# Patient Record
Sex: Male | Born: 1955 | Race: White | Hispanic: No | Smoking: Current every day smoker
Health system: Southern US, Community
[De-identification: ages and names within clinical notes are randomized; demographics above are authoritative.]

## PROBLEM LIST (undated history)

## (undated) DIAGNOSIS — J189 Pneumonia, unspecified organism: Secondary | ICD-10-CM

## (undated) DIAGNOSIS — I272 Pulmonary hypertension, unspecified: Secondary | ICD-10-CM

## (undated) DIAGNOSIS — R51 Headache: Secondary | ICD-10-CM

## (undated) DIAGNOSIS — R519 Headache, unspecified: Secondary | ICD-10-CM

## (undated) DIAGNOSIS — F32A Depression, unspecified: Secondary | ICD-10-CM

## (undated) DIAGNOSIS — F329 Major depressive disorder, single episode, unspecified: Secondary | ICD-10-CM

## (undated) DIAGNOSIS — T7840XA Allergy, unspecified, initial encounter: Secondary | ICD-10-CM

## (undated) DIAGNOSIS — J9610 Chronic respiratory failure, unspecified whether with hypoxia or hypercapnia: Secondary | ICD-10-CM

## (undated) DIAGNOSIS — I5032 Chronic diastolic (congestive) heart failure: Secondary | ICD-10-CM

## (undated) DIAGNOSIS — K219 Gastro-esophageal reflux disease without esophagitis: Secondary | ICD-10-CM

## (undated) DIAGNOSIS — I2699 Other pulmonary embolism without acute cor pulmonale: Secondary | ICD-10-CM

## (undated) DIAGNOSIS — F419 Anxiety disorder, unspecified: Secondary | ICD-10-CM

## (undated) DIAGNOSIS — I82403 Acute embolism and thrombosis of unspecified deep veins of lower extremity, bilateral: Secondary | ICD-10-CM

## (undated) DIAGNOSIS — Z720 Tobacco use: Secondary | ICD-10-CM

## (undated) DIAGNOSIS — J449 Chronic obstructive pulmonary disease, unspecified: Secondary | ICD-10-CM

## (undated) DIAGNOSIS — J439 Emphysema, unspecified: Secondary | ICD-10-CM

## (undated) DIAGNOSIS — Z87891 Personal history of nicotine dependence: Secondary | ICD-10-CM

## (undated) DIAGNOSIS — I10 Essential (primary) hypertension: Secondary | ICD-10-CM

## (undated) DIAGNOSIS — J45909 Unspecified asthma, uncomplicated: Secondary | ICD-10-CM

## (undated) HISTORY — PX: ROTATOR CUFF REPAIR: SHX139

## (undated) HISTORY — DX: Pulmonary hypertension, unspecified: I27.20

## (undated) HISTORY — DX: Acute embolism and thrombosis of unspecified deep veins of lower extremity, bilateral: I82.403

## (undated) HISTORY — PX: ADENOIDECTOMY: SUR15

## (undated) HISTORY — PX: HEMORRHOID SURGERY: SHX153

## (undated) HISTORY — PX: NASAL SINUS SURGERY: SHX719

## (undated) HISTORY — DX: Pneumonia, unspecified organism: J18.9

## (undated) HISTORY — PX: CARPAL TUNNEL RELEASE: SHX101

## (undated) HISTORY — PX: NOSE SURGERY: SHX723

## (undated) HISTORY — DX: Personal history of nicotine dependence: Z87.891

## (undated) HISTORY — PX: OTHER SURGICAL HISTORY: SHX169

## (undated) HISTORY — PX: TONSILLECTOMY: SUR1361

## (undated) HISTORY — PX: ABDOMINAL SURGERY: SHX537

## (undated) HISTORY — DX: Other pulmonary embolism without acute cor pulmonale: I26.99

## (undated) HISTORY — PX: URETHRA SURGERY: SHX824

## (undated) HISTORY — PX: SHOULDER SURGERY: SHX246

## (undated) HISTORY — DX: Headache: R51

## (undated) HISTORY — DX: Headache, unspecified: R51.9

## (undated) HISTORY — PX: ELBOW SURGERY: SHX618

## (undated) HISTORY — PX: TENNIS ELBOW RELEASE/NIRSCHEL PROCEDURE: SHX6651

---

## 2005-04-18 ENCOUNTER — Ambulatory Visit: Payer: Self-pay

## 2005-12-04 ENCOUNTER — Other Ambulatory Visit: Payer: Self-pay

## 2005-12-04 ENCOUNTER — Emergency Department: Payer: Self-pay | Admitting: Internal Medicine

## 2007-01-24 ENCOUNTER — Ambulatory Visit: Payer: Self-pay | Admitting: Specialist

## 2008-07-11 ENCOUNTER — Inpatient Hospital Stay: Payer: Self-pay | Admitting: Internal Medicine

## 2008-08-05 ENCOUNTER — Ambulatory Visit: Payer: Self-pay | Admitting: Specialist

## 2008-08-14 ENCOUNTER — Inpatient Hospital Stay: Payer: Self-pay | Admitting: Internal Medicine

## 2008-11-19 ENCOUNTER — Ambulatory Visit: Payer: Self-pay | Admitting: Specialist

## 2008-12-21 ENCOUNTER — Inpatient Hospital Stay: Payer: Self-pay | Admitting: Specialist

## 2009-03-23 ENCOUNTER — Ambulatory Visit: Payer: Self-pay | Admitting: Specialist

## 2009-11-17 ENCOUNTER — Ambulatory Visit: Payer: Self-pay | Admitting: Specialist

## 2010-11-17 ENCOUNTER — Inpatient Hospital Stay: Payer: Self-pay | Admitting: Internal Medicine

## 2011-08-15 ENCOUNTER — Inpatient Hospital Stay: Payer: Self-pay | Admitting: Internal Medicine

## 2011-12-31 ENCOUNTER — Emergency Department: Payer: Self-pay | Admitting: *Deleted

## 2012-01-01 LAB — CBC
MCH: 31.7 pg (ref 26.0–34.0)
MCHC: 32.4 g/dL (ref 32.0–36.0)
MCV: 98 fL (ref 80–100)
RBC: 4.59 10*6/uL (ref 4.40–5.90)
RDW: 13.4 % (ref 11.5–14.5)

## 2012-01-01 LAB — COMPREHENSIVE METABOLIC PANEL
Alkaline Phosphatase: 70 U/L (ref 50–136)
Anion Gap: 6 — ABNORMAL LOW (ref 7–16)
BUN: 10 mg/dL (ref 7–18)
Bilirubin,Total: 0.4 mg/dL (ref 0.2–1.0)
Chloride: 103 mmol/L (ref 98–107)
Creatinine: 1.01 mg/dL (ref 0.60–1.30)
EGFR (African American): >60
EGFR (Non-African Amer.): >60
Glucose: 95 mg/dL (ref 65–99)
Osmolality: 284 (ref 275–301)
SGOT(AST): 22 U/L (ref 15–37)
Sodium: 143 mmol/L (ref 136–145)

## 2012-01-01 LAB — CK TOTAL AND CKMB (NOT AT ARMC)
CK, Total: 131 U/L (ref 35–232)
CK-MB: 3.1 ng/mL (ref 0.5–3.6)

## 2012-02-24 LAB — CK TOTAL AND CKMB (NOT AT ARMC): CK, Total: 102 U/L (ref 35–232)

## 2012-02-24 LAB — BASIC METABOLIC PANEL
Anion Gap: 10 (ref 7–16)
BUN: 8 mg/dL (ref 7–18)
Calcium, Total: 8.8 mg/dL (ref 8.5–10.1)
Creatinine: 0.95 mg/dL (ref 0.60–1.30)
EGFR (African American): >60
EGFR (Non-African Amer.): >60
Osmolality: 280 (ref 275–301)
Potassium: 3.5 mmol/L (ref 3.5–5.1)

## 2012-02-24 LAB — TROPONIN I: Troponin-I: <0.02 ng/mL

## 2012-02-24 LAB — CBC
HCT: 43.7 % (ref 40.0–52.0)
HGB: 14.5 g/dL (ref 13.0–18.0)
MCH: 32.3 pg (ref 26.0–34.0)
MCHC: 33.2 g/dL (ref 32.0–36.0)
MCV: 97 fL (ref 80–100)

## 2012-02-24 LAB — PRO B NATRIURETIC PEPTIDE: B-Type Natriuretic Peptide: 184 pg/mL — ABNORMAL HIGH (ref 0–125)

## 2012-02-26 ENCOUNTER — Inpatient Hospital Stay: Payer: Self-pay | Admitting: Student

## 2012-02-27 LAB — EXPECTORATED SPUTUM ASSESSMENT W GRAM STAIN, RFLX TO RESP C

## 2012-02-28 LAB — CREATININE, SERUM
Creatinine: 1.09 mg/dL (ref 0.60–1.30)
EGFR (African American): >60
EGFR (Non-African Amer.): >60

## 2012-02-28 LAB — PLATELET COUNT: Platelet: 291 10*3/uL (ref 150–440)

## 2012-02-29 DIAGNOSIS — I517 Cardiomegaly: Secondary | ICD-10-CM

## 2012-03-01 LAB — CULTURE, BLOOD (SINGLE)

## 2012-03-02 LAB — BASIC METABOLIC PANEL
Calcium, Total: 8.4 mg/dL — ABNORMAL LOW (ref 8.5–10.1)
Chloride: 100 mmol/L (ref 98–107)
Co2: 32 mmol/L (ref 21–32)
Creatinine: 1.18 mg/dL (ref 0.60–1.30)
EGFR (Non-African Amer.): >60
Glucose: 106 mg/dL — ABNORMAL HIGH (ref 65–99)
Potassium: 4.3 mmol/L (ref 3.5–5.1)

## 2012-03-03 LAB — PLATELET COUNT: Platelet: 246 10*3/uL (ref 150–440)

## 2012-03-06 LAB — TSH: Thyroid Stimulating Horm: 0.807 u[IU]/mL

## 2012-03-22 DIAGNOSIS — I509 Heart failure, unspecified: Secondary | ICD-10-CM | POA: Insufficient documentation

## 2012-10-23 ENCOUNTER — Other Ambulatory Visit: Payer: Self-pay | Admitting: Specialist

## 2012-10-23 LAB — CBC WITH DIFFERENTIAL/PLATELET
Basophil #: 0.1 10*3/uL (ref 0.0–0.1)
Basophil %: 1.3 %
Eosinophil #: 0.3 10*3/uL (ref 0.0–0.7)
Lymphocyte #: 2.3 10*3/uL (ref 1.0–3.6)
Lymphocyte %: 23.3 %
MCHC: 33 g/dL (ref 32.0–36.0)
MCV: 96 fL (ref 80–100)
Monocyte %: 7.6 %
Platelet: 208 10*3/uL (ref 150–440)
RBC: 4.69 10*6/uL (ref 4.40–5.90)
WBC: 9.7 10*3/uL (ref 3.8–10.6)

## 2012-10-23 LAB — COMPREHENSIVE METABOLIC PANEL
Albumin: 3.8 g/dL (ref 3.4–5.0)
Alkaline Phosphatase: 94 U/L (ref 50–136)
Bilirubin,Total: 0.5 mg/dL (ref 0.2–1.0)
Calcium, Total: 8.6 mg/dL (ref 8.5–10.1)
Chloride: 108 mmol/L — ABNORMAL HIGH (ref 98–107)
Co2: 25 mmol/L (ref 21–32)
EGFR (African American): >60
Osmolality: 277 (ref 275–301)
SGOT(AST): 14 U/L — ABNORMAL LOW (ref 15–37)
Sodium: 140 mmol/L (ref 136–145)

## 2013-06-23 ENCOUNTER — Other Ambulatory Visit: Payer: Self-pay | Admitting: Physician Assistant

## 2013-06-23 LAB — COMPREHENSIVE METABOLIC PANEL
Alkaline Phosphatase: 105 U/L (ref 50–136)
BUN: 8 mg/dL (ref 7–18)
Bilirubin,Total: 0.4 mg/dL (ref 0.2–1.0)
Calcium, Total: 8.6 mg/dL (ref 8.5–10.1)
Chloride: 108 mmol/L — ABNORMAL HIGH (ref 98–107)
Co2: 27 mmol/L (ref 21–32)
Creatinine: 1.13 mg/dL (ref 0.60–1.30)
EGFR (Non-African Amer.): >60
Glucose: 101 mg/dL — ABNORMAL HIGH (ref 65–99)
Osmolality: 276 (ref 275–301)
SGOT(AST): 29 U/L (ref 15–37)
SGPT (ALT): 30 U/L (ref 12–78)

## 2013-06-23 LAB — CBC WITH DIFFERENTIAL/PLATELET
Basophil #: 0.1 10*3/uL (ref 0.0–0.1)
Eosinophil #: 0.2 10*3/uL (ref 0.0–0.7)
Eosinophil %: 2.5 %
HCT: 43.8 % (ref 40.0–52.0)
HGB: 14.9 g/dL (ref 13.0–18.0)
Lymphocyte #: 0.9 10*3/uL — ABNORMAL LOW (ref 1.0–3.6)
MCH: 32.5 pg (ref 26.0–34.0)
MCHC: 34 g/dL (ref 32.0–36.0)
Monocyte #: 0.7 x10 3/mm (ref 0.2–1.0)
Monocyte %: 9.9 %
Neutrophil #: 5.1 10*3/uL (ref 1.4–6.5)
Platelet: 163 10*3/uL (ref 150–440)
RDW: 14 % (ref 11.5–14.5)
WBC: 6.9 10*3/uL (ref 3.8–10.6)

## 2013-06-23 LAB — TSH: Thyroid Stimulating Horm: 0.998 u[IU]/mL

## 2013-06-24 ENCOUNTER — Inpatient Hospital Stay: Payer: Self-pay | Admitting: Family Medicine

## 2013-06-24 LAB — BASIC METABOLIC PANEL
Anion Gap: 5 — ABNORMAL LOW (ref 7–16)
BUN: 7 mg/dL (ref 7–18)
Chloride: 109 mmol/L — ABNORMAL HIGH (ref 98–107)
Co2: 27 mmol/L (ref 21–32)
Creatinine: 1.07 mg/dL (ref 0.60–1.30)
EGFR (African American): >60
Potassium: 3.9 mmol/L (ref 3.5–5.1)

## 2013-06-24 LAB — CBC
HCT: 41.7 % (ref 40.0–52.0)
HGB: 14.2 g/dL (ref 13.0–18.0)
MCV: 95 fL (ref 80–100)
Platelet: 157 10*3/uL (ref 150–440)
WBC: 5.6 10*3/uL (ref 3.8–10.6)

## 2013-06-24 LAB — TROPONIN I: Troponin-I: <0.02 ng/mL

## 2013-06-24 LAB — CK TOTAL AND CKMB (NOT AT ARMC): CK-MB: 2.1 ng/mL (ref 0.5–3.6)

## 2013-06-25 LAB — BASIC METABOLIC PANEL
BUN: 9 mg/dL (ref 7–18)
Calcium, Total: 8.9 mg/dL (ref 8.5–10.1)
Chloride: 107 mmol/L (ref 98–107)
Co2: 27 mmol/L (ref 21–32)
EGFR (Non-African Amer.): >60
Glucose: 134 mg/dL — ABNORMAL HIGH (ref 65–99)
Potassium: 4.5 mmol/L (ref 3.5–5.1)
Sodium: 138 mmol/L (ref 136–145)

## 2013-06-25 LAB — CBC WITH DIFFERENTIAL/PLATELET
Eosinophil #: 0 10*3/uL (ref 0.0–0.7)
HCT: 41.5 % (ref 40.0–52.0)
HGB: 14.1 g/dL (ref 13.0–18.0)
MCH: 32.8 pg (ref 26.0–34.0)
MCHC: 33.9 g/dL (ref 32.0–36.0)
Monocyte #: 0.4 x10 3/mm (ref 0.2–1.0)
Monocyte %: 2.4 %
Neutrophil #: 17 10*3/uL — ABNORMAL HIGH (ref 1.4–6.5)
Neutrophil %: 92.7 %
Platelet: 185 10*3/uL (ref 150–440)
RBC: 4.28 10*6/uL — ABNORMAL LOW (ref 4.40–5.90)
WBC: 18.4 10*3/uL — ABNORMAL HIGH (ref 3.8–10.6)

## 2013-06-27 LAB — EXPECTORATED SPUTUM ASSESSMENT W GRAM STAIN, RFLX TO RESP C

## 2013-06-29 LAB — CBC WITH DIFFERENTIAL/PLATELET
Basophil #: 0 10*3/uL (ref 0.0–0.1)
Basophil %: 0.1 %
Eosinophil #: 0 10*3/uL (ref 0.0–0.7)
HCT: 41.1 % (ref 40.0–52.0)
Lymphocyte #: 0.9 10*3/uL — ABNORMAL LOW (ref 1.0–3.6)
MCH: 32.6 pg (ref 26.0–34.0)
MCHC: 34.1 g/dL (ref 32.0–36.0)
MCV: 96 fL (ref 80–100)
Monocyte #: 0.6 x10 3/mm (ref 0.2–1.0)
Neutrophil #: 13.6 10*3/uL — ABNORMAL HIGH (ref 1.4–6.5)
Platelet: 226 10*3/uL (ref 150–440)

## 2013-06-30 LAB — CBC WITH DIFFERENTIAL/PLATELET
Basophil #: 0.1 10*3/uL (ref 0.0–0.1)
Basophil %: 0.4 %
Lymphocyte #: 1 10*3/uL (ref 1.0–3.6)
MCH: 32.4 pg (ref 26.0–34.0)
MCV: 96 fL (ref 80–100)
Monocyte #: 0.9 x10 3/mm (ref 0.2–1.0)
Monocyte %: 4.5 %
Neutrophil #: 17.5 10*3/uL — ABNORMAL HIGH (ref 1.4–6.5)
Neutrophil %: 90 %
Platelet: 235 10*3/uL (ref 150–440)
RDW: 13.8 % (ref 11.5–14.5)

## 2013-08-05 DIAGNOSIS — A419 Sepsis, unspecified organism: Secondary | ICD-10-CM | POA: Insufficient documentation

## 2013-08-05 DIAGNOSIS — J189 Pneumonia, unspecified organism: Secondary | ICD-10-CM | POA: Insufficient documentation

## 2013-08-05 DIAGNOSIS — T2000XA Burn of unspecified degree of head, face, and neck, unspecified site, initial encounter: Secondary | ICD-10-CM | POA: Insufficient documentation

## 2013-09-04 ENCOUNTER — Ambulatory Visit: Payer: Self-pay | Admitting: Internal Medicine

## 2013-09-24 ENCOUNTER — Other Ambulatory Visit: Payer: Self-pay | Admitting: Physician Assistant

## 2013-09-24 LAB — LIPID PANEL
CHOLESTEROL: 142 mg/dL (ref 0–200)
HDL Cholesterol: 56 mg/dL (ref 40–60)
LDL CHOLESTEROL, CALC: 71 mg/dL (ref 0–100)
Triglycerides: 74 mg/dL (ref 0–200)
VLDL Cholesterol, Calc: 15 mg/dL (ref 5–40)

## 2013-09-24 LAB — COMPREHENSIVE METABOLIC PANEL
ANION GAP: 5 — AB (ref 7–16)
Albumin: 3.4 g/dL (ref 3.4–5.0)
Alkaline Phosphatase: 125 U/L — ABNORMAL HIGH
BUN: 8 mg/dL (ref 7–18)
Bilirubin,Total: 0.8 mg/dL (ref 0.2–1.0)
CREATININE: 0.94 mg/dL (ref 0.60–1.30)
Calcium, Total: 8.8 mg/dL (ref 8.5–10.1)
Chloride: 108 mmol/L — ABNORMAL HIGH (ref 98–107)
Co2: 26 mmol/L (ref 21–32)
EGFR (African American): >60
EGFR (Non-African Amer.): >60
Glucose: 92 mg/dL (ref 65–99)
Osmolality: 276 (ref 275–301)
Potassium: 4 mmol/L (ref 3.5–5.1)
SGOT(AST): 18 U/L (ref 15–37)
SGPT (ALT): 32 U/L (ref 12–78)
Sodium: 139 mmol/L (ref 136–145)
Total Protein: 7.3 g/dL (ref 6.4–8.2)

## 2013-09-24 LAB — CBC WITH DIFFERENTIAL/PLATELET
BASOS ABS: 0 10*3/uL (ref 0.0–0.1)
BASOS PCT: 0.6 %
EOS PCT: 1.9 %
Eosinophil #: 0.1 10*3/uL (ref 0.0–0.7)
HCT: 39.3 % — ABNORMAL LOW (ref 40.0–52.0)
HGB: 13.3 g/dL (ref 13.0–18.0)
Lymphocyte #: 1.3 10*3/uL (ref 1.0–3.6)
Lymphocyte %: 19 %
MCH: 32.2 pg (ref 26.0–34.0)
MCHC: 33.9 g/dL (ref 32.0–36.0)
MCV: 95 fL (ref 80–100)
MONO ABS: 0.6 x10 3/mm (ref 0.2–1.0)
Monocyte %: 8 %
NEUTROS ABS: 5 10*3/uL (ref 1.4–6.5)
Neutrophil %: 70.5 %
Platelet: 245 10*3/uL (ref 150–440)
RBC: 4.13 10*6/uL — ABNORMAL LOW (ref 4.40–5.90)
RDW: 14.2 % (ref 11.5–14.5)
WBC: 7.1 10*3/uL (ref 3.8–10.6)

## 2014-01-13 ENCOUNTER — Inpatient Hospital Stay: Payer: Self-pay | Admitting: Internal Medicine

## 2014-01-13 LAB — URINALYSIS, COMPLETE
Bilirubin,UR: NEGATIVE
GLUCOSE, UR: NEGATIVE mg/dL (ref 0–75)
Ketone: NEGATIVE
NITRITE: NEGATIVE
Ph: 5 (ref 4.5–8.0)
Protein: NEGATIVE
Specific Gravity: 1.019 (ref 1.003–1.030)

## 2014-01-13 LAB — BASIC METABOLIC PANEL
Anion Gap: 6 — ABNORMAL LOW (ref 7–16)
BUN: 8 mg/dL (ref 7–18)
Calcium, Total: 9.1 mg/dL (ref 8.5–10.1)
Chloride: 107 mmol/L (ref 98–107)
Co2: 28 mmol/L (ref 21–32)
Creatinine: 1.09 mg/dL (ref 0.60–1.30)
EGFR (Non-African Amer.): >60
GLUCOSE: 122 mg/dL — AB (ref 65–99)
Osmolality: 281 (ref 275–301)
POTASSIUM: 3.9 mmol/L (ref 3.5–5.1)
Sodium: 141 mmol/L (ref 136–145)

## 2014-01-13 LAB — TROPONIN I

## 2014-01-13 LAB — CBC
HCT: 44.1 % (ref 40.0–52.0)
HGB: 14.3 g/dL (ref 13.0–18.0)
MCH: 31.2 pg (ref 26.0–34.0)
MCHC: 32.5 g/dL (ref 32.0–36.0)
MCV: 96 fL (ref 80–100)
PLATELETS: 187 10*3/uL (ref 150–440)
RBC: 4.6 10*6/uL (ref 4.40–5.90)
RDW: 14.1 % (ref 11.5–14.5)
WBC: 12.9 10*3/uL — AB (ref 3.8–10.6)

## 2014-01-14 LAB — BASIC METABOLIC PANEL
ANION GAP: 5 — AB (ref 7–16)
BUN: 10 mg/dL (ref 7–18)
CO2: 27 mmol/L (ref 21–32)
CREATININE: 0.89 mg/dL (ref 0.60–1.30)
Calcium, Total: 8.9 mg/dL (ref 8.5–10.1)
Chloride: 106 mmol/L (ref 98–107)
EGFR (Non-African Amer.): >60
GLUCOSE: 136 mg/dL — AB (ref 65–99)
Osmolality: 277 (ref 275–301)
Potassium: 4.7 mmol/L (ref 3.5–5.1)
Sodium: 138 mmol/L (ref 136–145)

## 2014-01-14 LAB — CBC WITH DIFFERENTIAL/PLATELET
BASOS ABS: 0.1 10*3/uL (ref 0.0–0.1)
Basophil %: 0.9 %
EOS PCT: 0 %
Eosinophil #: 0 10*3/uL (ref 0.0–0.7)
HCT: 42.6 % (ref 40.0–52.0)
HGB: 14 g/dL (ref 13.0–18.0)
LYMPHS ABS: 0.7 10*3/uL — AB (ref 1.0–3.6)
LYMPHS PCT: 7.7 %
MCH: 31.4 pg (ref 26.0–34.0)
MCHC: 32.9 g/dL (ref 32.0–36.0)
MCV: 95 fL (ref 80–100)
MONOS PCT: 0.9 %
Monocyte #: 0.1 x10 3/mm — ABNORMAL LOW (ref 0.2–1.0)
NEUTROS PCT: 90.5 %
Neutrophil #: 8 10*3/uL — ABNORMAL HIGH (ref 1.4–6.5)
Platelet: 177 10*3/uL (ref 150–440)
RBC: 4.47 10*6/uL (ref 4.40–5.90)
RDW: 13.8 % (ref 11.5–14.5)
WBC: 8.9 10*3/uL (ref 3.8–10.6)

## 2014-01-15 LAB — URINE CULTURE

## 2014-01-17 LAB — CREATININE, SERUM: Creatinine: 0.99 mg/dL (ref 0.60–1.30)

## 2014-01-17 LAB — VANCOMYCIN, TROUGH: Vancomycin, Trough: 14 ug/mL (ref 10–20)

## 2014-01-18 LAB — EXPECTORATED SPUTUM ASSESSMENT W REFEX TO RESP CULTURE

## 2014-01-18 LAB — CULTURE, BLOOD (SINGLE)

## 2014-01-19 LAB — PLATELET COUNT: Platelet: 220 10*3/uL (ref 150–440)

## 2014-01-20 LAB — VANCOMYCIN, TROUGH: VANCOMYCIN, TROUGH: 16 ug/mL (ref 10–20)

## 2014-02-26 ENCOUNTER — Ambulatory Visit: Payer: Self-pay | Admitting: Physician Assistant

## 2014-03-06 LAB — CBC
HCT: 42.5 % (ref 40.0–52.0)
HGB: 13.6 g/dL (ref 13.0–18.0)
MCH: 30.7 pg (ref 26.0–34.0)
MCHC: 31.9 g/dL — AB (ref 32.0–36.0)
MCV: 96 fL (ref 80–100)
Platelet: 243 10*3/uL (ref 150–440)
RBC: 4.41 10*6/uL (ref 4.40–5.90)
RDW: 14.5 % (ref 11.5–14.5)
WBC: 13.5 10*3/uL — ABNORMAL HIGH (ref 3.8–10.6)

## 2014-03-06 LAB — BASIC METABOLIC PANEL
Anion Gap: 3 — ABNORMAL LOW (ref 7–16)
BUN: 9 mg/dL (ref 7–18)
CREATININE: 1.02 mg/dL (ref 0.60–1.30)
Calcium, Total: 9.9 mg/dL (ref 8.5–10.1)
Chloride: 101 mmol/L (ref 98–107)
Co2: 33 mmol/L — ABNORMAL HIGH (ref 21–32)
EGFR (African American): >60
EGFR (Non-African Amer.): >60
Glucose: 110 mg/dL — ABNORMAL HIGH (ref 65–99)
OSMOLALITY: 273 (ref 275–301)
POTASSIUM: 4 mmol/L (ref 3.5–5.1)
SODIUM: 137 mmol/L (ref 136–145)

## 2014-03-06 LAB — TROPONIN I: Troponin-I: <0.02 ng/mL

## 2014-03-07 ENCOUNTER — Inpatient Hospital Stay: Payer: Self-pay | Admitting: Internal Medicine

## 2014-03-07 LAB — URINALYSIS, COMPLETE
Bilirubin,UR: NEGATIVE
Blood: NEGATIVE
Glucose,UR: NEGATIVE mg/dL (ref 0–75)
Hyaline Cast: 2
Leukocyte Esterase: NEGATIVE
NITRITE: NEGATIVE
Ph: 5 (ref 4.5–8.0)
SPECIFIC GRAVITY: 1.03 (ref 1.003–1.030)
Squamous Epithelial: <1
WBC UR: 3 /HPF (ref 0–5)

## 2014-03-07 LAB — THEOPHYLLINE LEVEL: THEOPHYLLINE: 6.9 ug/mL — AB (ref 10.0–20.0)

## 2014-03-08 LAB — BASIC METABOLIC PANEL
Anion Gap: 5 — ABNORMAL LOW (ref 7–16)
BUN: 10 mg/dL (ref 7–18)
CALCIUM: 9.4 mg/dL (ref 8.5–10.1)
CHLORIDE: 102 mmol/L (ref 98–107)
CREATININE: 0.93 mg/dL (ref 0.60–1.30)
Co2: 33 mmol/L — ABNORMAL HIGH (ref 21–32)
EGFR (African American): >60
EGFR (Non-African Amer.): >60
GLUCOSE: 129 mg/dL — AB (ref 65–99)
Osmolality: 280 (ref 275–301)
POTASSIUM: 4.4 mmol/L (ref 3.5–5.1)
SODIUM: 140 mmol/L (ref 136–145)

## 2014-03-08 LAB — CBC WITH DIFFERENTIAL/PLATELET
BASOS ABS: 0 10*3/uL (ref 0.0–0.1)
Basophil %: 0.1 %
EOS ABS: 0 10*3/uL (ref 0.0–0.7)
Eosinophil %: 0 %
HCT: 38.3 % — AB (ref 40.0–52.0)
HGB: 12.1 g/dL — ABNORMAL LOW (ref 13.0–18.0)
LYMPHS PCT: 4.2 %
Lymphocyte #: 0.7 10*3/uL — ABNORMAL LOW (ref 1.0–3.6)
MCH: 30.4 pg (ref 26.0–34.0)
MCHC: 31.6 g/dL — AB (ref 32.0–36.0)
MCV: 96 fL (ref 80–100)
MONO ABS: 0.7 x10 3/mm (ref 0.2–1.0)
Monocyte %: 4.2 %
Neutrophil #: 15.9 10*3/uL — ABNORMAL HIGH (ref 1.4–6.5)
Neutrophil %: 91.5 %
Platelet: 266 10*3/uL (ref 150–440)
RBC: 3.98 10*6/uL — ABNORMAL LOW (ref 4.40–5.90)
RDW: 14.2 % (ref 11.5–14.5)
WBC: 17.4 10*3/uL — ABNORMAL HIGH (ref 3.8–10.6)

## 2014-03-09 LAB — CREATININE, SERUM
Creatinine: 1.01 mg/dL (ref 0.60–1.30)
EGFR (African American): >60

## 2014-03-09 LAB — EXPECTORATED SPUTUM ASSESSMENT W GRAM STAIN, RFLX TO RESP C

## 2014-03-10 LAB — VANCOMYCIN, TROUGH: Vancomycin, Trough: 14 ug/mL (ref 10–20)

## 2014-03-11 LAB — VANCOMYCIN, TROUGH: Vancomycin, Trough: 18 ug/mL (ref 10–20)

## 2014-03-13 LAB — CREATININE, SERUM
Creatinine: 1 mg/dL (ref 0.60–1.30)
EGFR (African American): >60
EGFR (Non-African Amer.): >60

## 2014-05-27 ENCOUNTER — Emergency Department: Payer: Self-pay | Admitting: Emergency Medicine

## 2014-07-04 ENCOUNTER — Emergency Department: Payer: Self-pay | Admitting: Emergency Medicine

## 2014-07-04 LAB — TROPONIN I

## 2014-07-04 LAB — BASIC METABOLIC PANEL
ANION GAP: 7 (ref 7–16)
BUN: 11 mg/dL (ref 7–18)
CO2: 30 mmol/L (ref 21–32)
CREATININE: 0.98 mg/dL (ref 0.60–1.30)
Calcium, Total: 8.3 mg/dL — ABNORMAL LOW (ref 8.5–10.1)
Chloride: 105 mmol/L (ref 98–107)
EGFR (African American): >60
EGFR (Non-African Amer.): >60
Glucose: 102 mg/dL — ABNORMAL HIGH (ref 65–99)
OSMOLALITY: 283 (ref 275–301)
Potassium: 4.1 mmol/L (ref 3.5–5.1)
Sodium: 142 mmol/L (ref 136–145)

## 2014-07-04 LAB — CBC
HCT: 44.8 % (ref 40.0–52.0)
HGB: 14.5 g/dL (ref 13.0–18.0)
MCH: 30.9 pg (ref 26.0–34.0)
MCHC: 32.3 g/dL (ref 32.0–36.0)
MCV: 96 fL (ref 80–100)
Platelet: 169 10*3/uL (ref 150–440)
RBC: 4.69 10*6/uL (ref 4.40–5.90)
RDW: 13.9 % (ref 11.5–14.5)
WBC: 8 10*3/uL (ref 3.8–10.6)

## 2014-07-04 LAB — URINALYSIS, COMPLETE
BLOOD: NEGATIVE
Bacteria: NONE SEEN
Bilirubin,UR: NEGATIVE
Glucose,UR: NEGATIVE mg/dL (ref 0–75)
Ketone: NEGATIVE
Leukocyte Esterase: NEGATIVE
NITRITE: NEGATIVE
PROTEIN: NEGATIVE
Ph: 5 (ref 4.5–8.0)
RBC, UR: NONE SEEN /HPF (ref 0–5)
SPECIFIC GRAVITY: 1.014 (ref 1.003–1.030)

## 2014-07-23 ENCOUNTER — Emergency Department: Payer: Self-pay | Admitting: Emergency Medicine

## 2014-07-23 LAB — CBC
HCT: 42 % (ref 40.0–52.0)
HGB: 13.6 g/dL (ref 13.0–18.0)
MCH: 31 pg (ref 26.0–34.0)
MCHC: 32.4 g/dL (ref 32.0–36.0)
MCV: 96 fL (ref 80–100)
Platelet: 213 10*3/uL (ref 150–440)
RBC: 4.39 10*6/uL — ABNORMAL LOW (ref 4.40–5.90)
RDW: 14.2 % (ref 11.5–14.5)
WBC: 9.5 10*3/uL (ref 3.8–10.6)

## 2014-07-23 LAB — COMPREHENSIVE METABOLIC PANEL
ALT: 28 U/L
Albumin: 2.8 g/dL — ABNORMAL LOW (ref 3.4–5.0)
Alkaline Phosphatase: 87 U/L
Anion Gap: 6 — ABNORMAL LOW (ref 7–16)
BUN: 13 mg/dL (ref 7–18)
Bilirubin,Total: 0.6 mg/dL (ref 0.2–1.0)
CREATININE: 0.9 mg/dL (ref 0.60–1.30)
Calcium, Total: 8.4 mg/dL — ABNORMAL LOW (ref 8.5–10.1)
Chloride: 108 mmol/L — ABNORMAL HIGH (ref 98–107)
Co2: 25 mmol/L (ref 21–32)
EGFR (African American): >60
EGFR (Non-African Amer.): >60
GLUCOSE: 80 mg/dL (ref 65–99)
OSMOLALITY: 277 (ref 275–301)
Potassium: 4.1 mmol/L (ref 3.5–5.1)
SGOT(AST): 23 U/L (ref 15–37)
Sodium: 139 mmol/L (ref 136–145)
TOTAL PROTEIN: 6.6 g/dL (ref 6.4–8.2)

## 2014-07-23 LAB — URINALYSIS, COMPLETE
BLOOD: NEGATIVE
Bacteria: NONE SEEN
Bilirubin,UR: NEGATIVE
GLUCOSE, UR: NEGATIVE mg/dL (ref 0–75)
Hyaline Cast: 2
KETONE: NEGATIVE
LEUKOCYTE ESTERASE: NEGATIVE
Nitrite: NEGATIVE
PH: 5 (ref 4.5–8.0)
PROTEIN: NEGATIVE
Specific Gravity: 1.027 (ref 1.003–1.030)
Squamous Epithelial: NONE SEEN

## 2014-07-28 LAB — CULTURE, BLOOD (SINGLE)

## 2014-07-30 ENCOUNTER — Ambulatory Visit: Payer: Self-pay | Admitting: Internal Medicine

## 2014-09-02 ENCOUNTER — Ambulatory Visit: Payer: Self-pay | Admitting: Internal Medicine

## 2014-09-04 ENCOUNTER — Ambulatory Visit: Payer: Self-pay | Admitting: Physician Assistant

## 2014-09-08 ENCOUNTER — Ambulatory Visit: Payer: Self-pay | Admitting: Physician Assistant

## 2014-09-11 LAB — EXPECTORATED SPUTUM ASSESSMENT W GRAM STAIN, RFLX TO RESP C

## 2014-09-15 ENCOUNTER — Ambulatory Visit: Payer: Self-pay | Admitting: Internal Medicine

## 2014-09-21 DIAGNOSIS — F410 Panic disorder [episodic paroxysmal anxiety] without agoraphobia: Secondary | ICD-10-CM | POA: Diagnosis not present

## 2014-09-21 DIAGNOSIS — R911 Solitary pulmonary nodule: Secondary | ICD-10-CM | POA: Diagnosis not present

## 2014-09-21 DIAGNOSIS — J9611 Chronic respiratory failure with hypoxia: Secondary | ICD-10-CM | POA: Diagnosis not present

## 2014-09-21 DIAGNOSIS — J189 Pneumonia, unspecified organism: Secondary | ICD-10-CM | POA: Diagnosis not present

## 2014-09-21 DIAGNOSIS — L02415 Cutaneous abscess of right lower limb: Secondary | ICD-10-CM | POA: Diagnosis not present

## 2014-09-27 DIAGNOSIS — J471 Bronchiectasis with (acute) exacerbation: Secondary | ICD-10-CM | POA: Diagnosis not present

## 2014-10-20 DIAGNOSIS — J432 Centrilobular emphysema: Secondary | ICD-10-CM | POA: Diagnosis not present

## 2014-10-20 DIAGNOSIS — J9611 Chronic respiratory failure with hypoxia: Secondary | ICD-10-CM | POA: Diagnosis not present

## 2014-10-20 DIAGNOSIS — F17211 Nicotine dependence, cigarettes, in remission: Secondary | ICD-10-CM | POA: Diagnosis not present

## 2014-10-20 DIAGNOSIS — J449 Chronic obstructive pulmonary disease, unspecified: Secondary | ICD-10-CM | POA: Diagnosis not present

## 2014-10-20 DIAGNOSIS — F329 Major depressive disorder, single episode, unspecified: Secondary | ICD-10-CM | POA: Diagnosis not present

## 2014-10-28 DIAGNOSIS — J471 Bronchiectasis with (acute) exacerbation: Secondary | ICD-10-CM | POA: Diagnosis not present

## 2014-11-26 DIAGNOSIS — J471 Bronchiectasis with (acute) exacerbation: Secondary | ICD-10-CM | POA: Diagnosis not present

## 2014-12-27 DIAGNOSIS — J471 Bronchiectasis with (acute) exacerbation: Secondary | ICD-10-CM | POA: Diagnosis not present

## 2014-12-31 DIAGNOSIS — J431 Panlobular emphysema: Secondary | ICD-10-CM | POA: Diagnosis not present

## 2014-12-31 DIAGNOSIS — F17210 Nicotine dependence, cigarettes, uncomplicated: Secondary | ICD-10-CM | POA: Diagnosis not present

## 2014-12-31 DIAGNOSIS — J9611 Chronic respiratory failure with hypoxia: Secondary | ICD-10-CM | POA: Diagnosis not present

## 2014-12-31 DIAGNOSIS — N390 Urinary tract infection, site not specified: Secondary | ICD-10-CM | POA: Diagnosis not present

## 2014-12-31 DIAGNOSIS — F410 Panic disorder [episodic paroxysmal anxiety] without agoraphobia: Secondary | ICD-10-CM | POA: Diagnosis not present

## 2015-01-06 ENCOUNTER — Other Ambulatory Visit
Admit: 2015-01-06 | Disposition: A | Discharge status: Home / Self Care | Payer: Self-pay | Attending: Physician Assistant | Admitting: Physician Assistant

## 2015-01-06 DIAGNOSIS — J189 Pneumonia, unspecified organism: Secondary | ICD-10-CM | POA: Diagnosis not present

## 2015-01-08 NOTE — Consult Note (Signed)
PATIENT NAME:  Dustin [name], Dustin [name] MR#:  [id number] DATE OF BIRTH:  Nov 08, 1955  DATE OF CONSULTATION:  06/26/2013  REFERRING PHYSICIAN:   CONSULTING PHYSICIAN:  Zackery BarefootJ. Madison Sandeep Delagarza, MD  HISTORY OF PRESENT ILLNESS: The patient is a 59 year old Caucasian gentleman, who is an inpatient and was complaining of left ear pain and thought to possibly have fluid in his ear. He denies dizziness and vertigo, but admits to slight decrease in his hearing and a "full feeling" in the left ear. Allergies, medications and past medical history were reviewed and documented in the chart.   PHYSICAL EXAMINATION: Nose: Nares are patent. Septum is midline anteriorly. There are no intranasal masses or lesions. Oral cavity and oropharynx: No visible or palpable tongue lesions. Oropharynx is without erythema or any other visible lesions. Ears: Right external auditory canal shows scant cerumen. Tympanic membrane is normal. On the left, the external auditory canal was completely occluded with cerumen. This was debrided using mini-house and cerumen loop under magnification. The underlying canal is erythematous and inflamed. Tympanic membrane is clear. No visible middle ear fluid.   IMPRESSION: Left otitis externa with cerumen impaction that has now been removed.   PLAN: I have recommended Ciprodex 3 to 4 drops t.i.d. for 7 days. Follow up in my clinic in 2 weeks.  ____________________________ Shela CommonsJ. Gertie BaronMadison Acy Orsak, MD jmc:aw D: 07/07/2013 15:25:41 ET T: 07/07/2013 15:34:44 ET JOB#: [id number]  cc: Zackery BarefootJ. Madison Davey Limas, MD, <Dictator> [facility] - Practice Administrator Wendee CoppJMADISON Lorea Kupfer MD ELECTRONICALLY SIGNED 07/17/2013 20:34

## 2015-01-08 NOTE — H&P (Signed)
PATIENT NAME:  Dustin [name], Dustin [name] MR#:  [id number] DATE OF BIRTH:  1956/08/27  DATE OF ADMISSION:  06/23/2013  REFERRING PHYSICIAN: Jene Every, MD  PRIMARY CARE PHYSICIAN: Beverely Risen, MD  CHIEF COMPLAINT: Shortness of breath.   HISTORY OF PRESENT ILLNESS: The patient is a 59 year old Caucasian male with a past medical history of chronic respiratory failure, lives on 2 liters of oxygen, is presenting to the ER with the chief complaint of 7 day history of progressively worsening of shortness of breath associated with cough. Denies any fever, chest pain, abdominal pain, nausea, vomiting, diarrhea. The patient could not breathe yesterday night and came to the ER. The patient was given Solu-Medrol 125 mg IV and Zithromax IV. The patient was also given nebulizer treatments with no significant improvement. As the patient was diffusely wheezing, hospitalist team is called to admit the patient. Chest x-ray has revealed no acute infiltrates. During my examination, the patient is not using any accessory muscles, but still feeling tight in his chest. Feels slightly better. Wife is at bedside.   PAST MEDICAL HISTORY: Chronic hypoxic respiratory failure secondary to COPD, lives on 2 liters of home oxygen, obesity, anxiety, ongoing tobacco abuse.   PAST SURGICAL HISTORY: Shoulder surgery in 2000, elbow repair in 1996, sinus surgery in 1972 and 1973, reflux surgery in 1995, hemorrhoid repair in 1980.  ALLERGIES: IBUPROFEN, ASPIRIN, CODEINE.   PSYCHOSOCIAL HISTORY: Lives at home with wife. Smokes half pack to 1 pack a day. Denies any history of alcohol or illicit drug usage.   FAMILY HISTORY: Father had coronary artery disease. Mom had history of osteoarthritis and cancer.   REVIEW OF SYSTEMS: CONSTITUTIONAL: Denies any fever or fatigue.  EYES: Denies blurry vision, glaucoma.  ENT: Denies epistaxis, discharge. RESPIRATORY: COPD, positive cough.  CARDIOVASCULAR: Denies chest pain,  palpitations. GASTROENTEROLOGY: Denies nausea, vomiting, diarrhea.  GENITOURINARY: No dysuria, hematuria or hernia.  ENDOCRINE: Denies polyuria, nocturia. No thyroid problems.  HEMATOLOGIC AND LYMPHATIC: Denies anemia, easy bruising, bleeding. INTEGUMENT: Denies rash, lesions.  MUSCULOSKELETAL: No joint pain in the neck, back, shoulders. NEUROLOGIC: Denies any vertigo, ataxia.  PSYCHIATRIC: No ADD, OCD.   PHYSICAL EXAMINATION: VITAL SIGNS: Temperature 98 degrees Fahrenheit, pulse 101, respirations 20, blood pressure 127/76, pulse ox 94% on 2 liters.  GENERAL APPEARANCE: Not in acute distress. Moderately built and nourished.  HEENT: Normocephalic, atraumatic. Pupils are equally reacting to light and accommodation. No scleral icterus. No conjunctival injection. No sinus tenderness. No postnasal drip.  NECK: Supple. No JVD. No thyromegaly. No lymphadenopathy. Range of motion is intact.  LUNGS: Distant breath sounds. Diffuse wheezing is present. No crackles. No anterior chest wall tenderness on palpation.  HEART: S1 and S2 normal. Regular rate and rhythm. No murmurs.  ABDOMEN: Soft. Bowel sounds are positive in all 4 quadrants. Nontender, nondistended. No hepatosplenomegaly.  NEUROLOGIC: Awake, alert and oriented x 3. Cranial nerves II through XII are grossly intact. Motor and sensory are grossly intact. Reflexes are 2+.  EXTREMITIES: No edema. No cyanosis. No clubbing. SKIN: Warm to touch. Normal turgor. No rashes. No lesions.  MUSCULOSKELETAL: No joint effusion, tenderness, erythema.  PSYCHIATRIC: Normal mood and affect.   LABORATORY AND DIAGNOSTICS: Chest x-ray: No acute findings.  TSH 0.998. Glucose 107, BUN 7, creatinine 1.07, sodium 141, potassium 3.9, chloride 109, CO2 27, GFR greater than 60. Anion gap 5. Serum osmolality 280. Calcium 8.7. First set of cardiac enzymes are normal. CBC is normal.    ASSESSMENT AND PLAN: A 59 year old Caucasian male with history of chronic  obstructive  pulmonary disease, chronic respiratory failure, lives on oxygen, still smoking, is presenting to the ER with chief complaint of 7 day history of progressively worsening of shortness of breath associated with cough. Will be admitted with the following assessment and plan.  1.  Progressively worsening of dyspnea from acute exacerbation of chronic obstructive pulmonary disease. The patient will be admitted to telemetry bed. Intravenous steroids. Will provide him nebulizer treatments. Continue theophylline. Check  theophylline level in a.m. IV Zithromax. Sputum cultures.  2.  Anxiety. Continue his home medication Lorazepam for anxiety.  3.  Tobacco abuse. Counseled the patient to quit smoking and we will provide him nicotine patch.  4.  Chronic respiratory failure. Continue home oxygen.  5.  We will provide gastrointestinal and deep vein thrombosis prophylaxis.   Diagnosis and plan of care was discussed in detail with the patient and his wife at bedside. They both verbalized understanding of the plan. He is FULL CODE. Wife is the medical power of attorney.   TOTAL TIME SPENT ON ADMISSION: 45 minutes. ____________________________ Ramonita LabAruna Jamason Peckham, MD ag:sb D: 06/24/2013 07:08:38 ET T: 06/24/2013 07:31:14 ET JOB#: [id number]  cc: Ramonita LabAruna Pia Jedlicka, MD, <Dictator> Lyndon CodeFozia M. Khan, MD Ramonita LabARUNA Ninah Moccio MD ELECTRONICALLY SIGNED 06/28/2013 11:31

## 2015-01-08 NOTE — Discharge Summary (Signed)
PATIENT NAME:  Dustin [name], Dustin [name] MR#:  [id number] DATE OF BIRTH:  07-21-1956  DATE OF ADMISSION:  06/24/2013 DATE OF DISCHARGE:  07/01/2013  REASON FOR ADMISSION:  Increased shortness of breath.    DISCHARGE DIAGNOSES: 1.  Chronic obstructive pulmonary disease exacerbation.  2.  Acute on chronic respiratory failure due to chronic obstructive pulmonary disease.  3.  Anxiety.  4.  Tobacco abuse.  5.  Chronic diastolic dysfunction, compensated.  6.  Left ear pain due to otitis externa on ciprofloxacin drops.  7.  Leukocytosis due to steroids.  8.  Hypertension.   DISPOSITION: Home on home oxygen with CHF recommendations.    MEDICATIONS AT DISCHARGE: Theophylline 200 mg twice daily, Tylenol as needed for pain, Advair Diskus 250/50 twice daily, Spiriva 18 mcg daily,  furosemide 20 mg once a day as needed for edema, potassium 10 mEq as needed for shortness of breath, Veramyst 27.5 mcg inhaler once a day, prednisone taper starting at 60 mg for 10 days albuterol inhaler nebulizer prescription given to the patient, Cardizem 120 mg once a day CR, amoxicillin 875 mg twice daily, ciprofloxacin otic drops.  FOLLOWUP:  Dr. Beverely Risen in 2 weeks.   IMPORTANT RESULTS: Glucose 134, creatinine 1.12. White count on admission was 6.9 and at discharge 19,000. This is secondary to steroid use.   Sputum culture: White blood cells are positive. Moderate gram-positive cocci, usually consistent with normal flora.   EKG: Normal sinus rhythm.   Heart rate 90s to 100s, which is chronic for him. Oxygen: Needs 2 liters continuously.   HOSPITAL COURSE: This is a very nice 59 year old gentleman who has history of significant COPD.     The patient has also hypertension, chronic respiratory failure. He lives on 2 liters of oxygen at home.   He came to the ER complaining of some shortness of breath over 7 days, worsening progressively, associated with cough and phlegm.   The patient was admitted through the ER, was  put on Solu-Medrol IV, azithromycin and Rocephin to cover for COPD examination. Chest x-ray did not show any significant signs of pneumonia. The patient was requiring higher levels of oxygen, 4 liters and above continuously, but now he is back on his baseline, which is 2 liters. He has been on baseline for the past 2 days, so we decided to give him a little bit longer because he was still wheezing significantly, having significant secretions now. All this is better. The patient is ready to be discharged.   The patient is to be discharged on out Advair and Spiriva, and treatment with Augmentin for outpatient.   As far as his other medical problems, the patient has history of anxiety for which he takes lorazepam. He is a smoker. Tobacco counseling has been given to him on multiple occasions. He decides not to quit smoking yet. The patient has chronic respiratory failure on 2 liters of oxygen. He is back to his baseline.   As far as his hypertension, the patient is now taking Cardizem. He is slightly tachycardic, and this is chronic for him. As far as his other medical problems, they remain stable. The patient has significant leukocytosis due to use of his steroids, better other than that, no new signs of infection. He is going to be taking antibiotics to complete a course of 10 to 14 days. Steroid taper given to the patient. Allocation given to the patient.   I spent about 45 minutes with his discharge today. He is going to go  on continuous oxygen. We are going to give him nebulizer and also new concentrator.    ____________________________ Dustin Furnaceoberto Sanchez Gutierrez, MD rsg:dmm D: 07/01/2013 13:36:00 ET T: 07/01/2013 14:00:15 ET JOB#: [id number]  cc: Dustin Furnaceoberto Sanchez Gutierrez, MD, <Dictator> Lyndon CodeFozia M. Khan, MD Pearletha FurlOBERTO SANCHEZ GUTIERRE MD ELECTRONICALLY SIGNED 07/12/2013 22:55

## 2015-01-09 NOTE — Discharge Summary (Signed)
PATIENT NAME:  Dustin [name], Dustin [name] MR#:  [id number] DATE OF BIRTH:  [birth year]  DATE OF ADMISSION:  01/13/2014 DATE OF DISCHARGE:  01/20/2014  Discharged to skilled nursing facility, rehabilitation facility.   ADMITTING DIAGNOSIS: Acute on chronic respiratory failure,   DISCHARGE DIAGNOSES: 1. Acute on chronic respiratory failure due to chronic obstructive pulmonary disease exacerbation and Methicillin-resistant Staphylococcus aureus pneumonia.  2. Urinary tract infection Escherichia coli extended spectrum beta-lactamase .  3. Status post PICC line placement on 01/17/2014. 4. History of hypertension anxiety, ongoing tobacco abuse, as well as obesity.   DISCHARGE CONDITION: Stable.   DISCHARGE MEDICATIONS: The patient is to continue:  1. Theophylline 200 mg twice daily this is extended released  theophylline. 2. Advair Diskus 750/50 1 puff twice daily.  3. Alprazolam 0.5 mg 3 times daily.  4. Furosemide 20 mg p.o. daily as needed.  5. Ventolin HFA 2 puffs every four hours as needed. 6.  Veramyst 27.5 mcg inhalation nasal spray one spray to nose once daily.  7. Albuterol ipratropium 2.5/0.5 mg in 3 mL inhalation solution, 3 mL every four hours as needed.  8. Diltiazem extended release 120 mg p.o. daily.  9. Potassium chloride 10 mEq once daily as needed together with Lasix.  10. Lexapro 5 mg p.o. at bedtime.  11. Prednisone taper 30 mg p.o. once on 01/21/2014, then taper x 10 mg daily until stopped.  12. Lexapro  p.o. at bedtime as needed.  13. Tiotropium  inhalation 1 capsule once daily.  14. Ertapenem 1 gram every 24 hours for two more days.  15. Erythromycin 500 mg once daily for one day.  16. Linezolid 600 mg twice daily orally for 10 more days.   HOME OXYGEN: At 3 liters to 4 liters of oxygen through nasal cannula to be weaned according to tolerance to the patient's baseline at 2 liters of oxygen through nasal cannula.   DIET: 2 grams salt, low fat, low cholesterol, regular  consistency.   ACTIVITY LIMITATIONS: As tolerated.   REFERRALS: To physical therapy as well as occupational therapy.    FOLLOWUP APPOINTMENT: With Dr. Beverely Risen in two days after discharge.  CONSULTANTS: Care management, social work.   RADIOLOGIC STUDIES: Chest x-ray PA and lateral 01/13/2014, revealing slightly increased bronchial wall thickening in bilateral lower lobe.  Findings which may represent acute on chronic bronchitis, COPD and emphysema was noted. Chronic bibasilar atelectasis or scarring that is similar compared to prior imaging was also noted. Repeated chest x-ray PA and lateral, 01/14/2014, revealed basilar interstitial fibrosis and   subsegmental atelectasis. Follow-up chest x-ray could be obtained to exclude developing pneumonia, particularly in left lower lobe. COPD was also noted. Repeated chest x-ray, portable single view, 01/17/2014 to check line placement, left PICC line noted in the inferior aspect of superior vena cava. If a position at  caval atrial junction was desired,  this could be advanced x 3 cm, decreased left basilar atelectasis and interval mild right basilar atelectasis, COPD was also noted. Repeated chest x-ray done on 01/19/2014 portable single view, revealed mild patchy bilateral lower lobe opacities, likely atelectasis. Pneumonia is not excluded, according to radiologist.   The patient is a 59 year old Caucasian male with history of chronic respiratory failure, on oxygen at 2 liters of oxygen through nasal cannula at home, who presents to the hospital with complaints of shortness of breath. Please refer to Dr. Larose Hires admission note on 01/13/2014. On arrival to the hospital the patient's temperature was 98.2, pulse was 105, respiratory rate  was 20, blood pressure 125/74, saturation was 89%, but was comfortable at 96% on oxygen supplementation. The patient's physical exam revealed bilateral equal air entrance as well as expiratory wheezing. The patient's lab data  done on admission 01/13/2014, revealed mild elevation of glucose to 122, otherwise BMP was unremarkable. One set of cardiac enzymes, troponin was less than 0.02. The patient's white blood cell count was elevated at 12.9, hemoglobin was 14.3, platelet count 187. Blood cultures taken on the 01/13/2014, showed no growth. Urinalysis revealed abnormal urine was 152 white blood cells,  8 red blood cells, 3+ leukocyte esterase, 1+ blood, negative for nitrites or protein, but is was yellow and hazy in color and mucus was present and 1+ bacteria. The patient's urine culture showed Escherichia coli, more than 100,000 colony-forming units sensitive to cefazolin, gentamicin, imipenem, nitrofurantoin, resistant to all other antibiotics. Sputum cx grew MRSA   sulfamethoxazole, levofloxacin, ciprofloxacin, Rocephin, ampicillin, as well as cefazolin.  The patient's sputum cultures came back positive for MRSA sensitive to gentamycin, vancomycin, trimetoprim- sulfamethoxazole, clindamycin, linezolid .  The patient was started on broad-spectrum antibiotic therapy and vancomycin was added when sputum cultures came back positive for MRSA. The patient was continued on antibiotic therapy for some period of time; however, since the patient had urinary tract infection Escherichia coli, ESBL., We made decision to place a PICC line so we can administer antibiotics long-term. The patient is to continue ertapenem for  two more days to complete a seven day course.   In regards to pneumonia, was felt that the patient's pneumonia is MRSA pneumonia. The patient is to continue antibiotic therapy with linezolid for 10 more days to complete a 14 day course. He is to continue also azithromycin to finish five day course for one additional day.  In regards to hypertension, anxiety, obesity. The patient was recommended to continue the same medical therapy. For COPD exacerbation the patient is to continue theophylline. I did discuss also, Ventolin,  DuoNeb and prednisone taper. His oxygenation has improved. His oxygen saturation remained somewhat low at around 90 on 3 days of oxygen through nasal cannula, but we felt that the patient could be also somewhat fluid overloaded despite chest x-ray not showing significant fluid overload. We made decision also to give him one dose of IV Lasix 20 mg of Lasix today on 01/20/2014. It is recommended to intermittently administer Lasix since the patient has been on IV fluids in the form of antibiotics and being administered IV during his stay in the hospital time. With this we hope that we will be able to wean him down to 2 liters of oxygen through nasal cannula and his oxygenation will improve with diuretic administration.   His vital signs are stable on the day of discharge, 01/20/2014 with temperature  of 97.9, pulse 91, respiratory rate rate was 19, blood pressure 150/74, saturation was 88% to 93% on 3 liters of oxygen through nasal cannula at rest.   TIME SPENT: 40 minutes with the patient.   Of note, the patient was evaluated by physical therapist and recommended rehabilitation placement.   ____________________________ Katharina Caperima Kaydense Rizo, MD rv:sg D: 01/20/2014 12:13:00 ET T: 01/20/2014 13:12:01 ET JOB#: [id number]  cc: Lyndon CodeFozia M. Khan, MD Katharina Caperima Cheyene Hamric, MD, <Dictator>     Layaan Mott MD ELECTRONICALLY SIGNED 02/05/2014 15:37

## 2015-01-09 NOTE — Discharge Summary (Signed)
Dates of Admission and Diagnosis:  Date of Admission 07-Mar-2014   Date of Discharge 13-Mar-2014   Admitting Diagnosis Ac on ch respi failure   Final Diagnosis Ac on ch respi failure COPD exacerbation Ac bronchitis    Chief Complaint/History of Present Illness a 59 year old male with a past medical history of COPD, chronic respiratory failure, placed on 2.5 liters of oxygen, is coming to the ED with a chief complaint of shortness of breath associated with cough for the past 2 to 3 days.  The patient denies any chest tightness or chest pain, but he continues to smoke.  Denies any fever, but he feels hot.  In fact, the patient was seen by primary care physician, diagnosed with a UTI and the patient is started on Macrobid for UTI.  In the ED, the patient was given nebulizer treatments, but as he was still short of breath and wheezing the patient was given one dose of Solu-Medrol 125 mg IV.  As he was recently admitted to the hospital, the patient was started on IV Zosyn and vancomycin by the ER physician for possible pneumonia.  The patient is tachycardic.  His heart rate is running at around 125.  During my examination, the patient is reporting that his shortness of breath is slightly better, but still is coughing.  Wife is at bedside.  No sick contacts.  No other complaints.   Allergies:  Aspirin: Unknown  Codeine: Unknown  Ibuprofen: Unknown  Routine Micro:  20-Jun-15 14:53   Micro Text Report SPUTUM CULTURE   COMMENT                   CONSISTENT WITH NORMAL FLORA   GRAM STAIN                GOOD SPECIMEN-80-90% WBC   GRAM STAIN                MANY WHITE BLOOD CELLS   GRAM STAIN                RARE GRAM POSITIVE COCCI IN PAIRS IN CLUSTERS   GRAM STAIN                RARE YEAST   ANTIBIOTIC                       Culture Comment CONSISTENT WITH NORMAL FLORA  Gram Stain 1 GOOD SPECIMEN-80-90% WBC  Gram Stain 2 MANY WHITE BLOOD CELLS  Gram Stain 3 RARE GRAM POSITIVE COCCI IN PAIRS IN  CLUSTERS  Gram Stain 4 RARE YEAST  Result(s) reported on 09 Mar 2014 at 12:25PM.  Routine Chem:  19-Jun-15 22:41   Creatinine (comp) 1.02  eGFR (African American) >60  eGFR (Non-African American) >60 (eGFR values <20m/min/1.73 m2 may be an indication of chronic kidney disease (CKD). Calculated eGFR is useful in patients with stable renal function. The eGFR calculation will not be reliable in acutely ill patients when serum creatinine is changing rapidly. It is not useful in  patients on dialysis. The eGFR calculation may not be applicable to patients at the low and high extremes of body sizes, pregnant women, and vegetarians.)  Glucose, Serum  110  BUN 9  Sodium, Serum 137  Potassium, Serum 4.0  Chloride, Serum 101  CO2, Serum  33  Calcium (Total), Serum 9.9  Anion Gap  3  Osmolality (calc) 273  21-Jun-15 06:23   Creatinine (comp) 0.93  eGFR (African American) >60  eGFR (Non-African American) >60 (eGFR values <55m/min/1.73 m2 may be an indication of chronic kidney disease (CKD). Calculated eGFR is useful in patients with stable renal function. The eGFR calculation will not be reliable in acutely ill patients when serum creatinine is changing rapidly. It is not useful in  patients on dialysis. The eGFR calculation may not be applicable to patients at the low and high extremes of body sizes, pregnant women, and vegetarians.)  Glucose, Serum  129  BUN 10  Sodium, Serum 140  Potassium, Serum 4.4  Chloride, Serum 102  CO2, Serum  33  Calcium (Total), Serum 9.4  Anion Gap  5  Osmolality (calc) 280  26-Jun-15 04:38   Creatinine (comp) 1.00  eGFR (African American) >60  eGFR (Non-African American) >60 (eGFR values <681mmin/1.73 m2 may be an indication of chronic kidney disease (CKD). Calculated eGFR is useful in patients with stable renal function. The eGFR calculation will not be reliable in acutely ill patients when serum creatinine is changing rapidly. It is not  useful in  patients on dialysis. The eGFR calculation may not be applicable to patients at the low and high extremes of body sizes, pregnant women, and vegetarians.)  Cardiac:  19-Jun-15 22:41   Troponin I < 0.02 (0.00-0.05 0.05 ng/mL or less: NEGATIVE  Repeat testing in 3-6 hrs  if clinically indicated. >0.05 ng/mL: POTENTIAL  MYOCARDIAL INJURY. Repeat  testing in 3-6 hrs if  clinically indicated. NOTE: An increase or decrease  of 30% or more on serial  testing suggests a  clinically important change)  Routine Hem:  19-Jun-15 22:41   WBC (CBC)  13.5  RBC (CBC) 4.41  Hemoglobin (CBC) 13.6  Hematocrit (CBC) 42.5  Platelet Count (CBC) 243 (Result(s) reported on 06 Mar 2014 at 11:05PM.)  MCV 96  MCH 30.7  MCHC  31.9  RDW 14.5  21-Jun-15 06:23   WBC (CBC)  17.4  RBC (CBC)  3.98  Hemoglobin (CBC)  12.1  Hematocrit (CBC)  38.3  Platelet Count (CBC) 266  MCV 96  MCH 30.4  MCHC  31.6  RDW 14.2  Neutrophil % 91.5  Lymphocyte % 4.2  Monocyte % 4.2  Eosinophil % 0.0  Basophil % 0.1  Neutrophil #  15.9  Lymphocyte #  0.7  Monocyte # 0.7  Eosinophil # 0.0  Basophil # 0.0 (Result(s) reported on 08 Mar 2014 at 07:10AM.)   PERTINENT RADIOLOGY STUDIES: XRay:    19-Jun-15 23:07, Chest PA and Lateral  Chest PA and Lateral   REASON FOR EXAM:    shortness of breath  COMMENTS:       PROCEDURE: DXR - DXR CHEST PA (OR AP) AND LATERAL  - Mar 06 2014 11:07PM     CLINICAL DATA:  Increasing short of breath    EXAM:  CHEST  2 VIEW    COMPARISON:  02/26/2014    FINDINGS:  There isincreased linear opacities at the left right lung base. The  bullous change the upper lobes. No focal consolidation. No  pneumothorax.     IMPRESSION:  Increased linear densities at the lung base suggest worsening  bronchitis.    Emphysematous change.      Electronically Signed    By: StSuzy Bouchard.D.    On: 03/06/2014 23:57         Verified By: JORennis GoldenM.D.,  LabUnknown:   PACS Image     22-Jun-15 13:10, [facility]  P[facility] Image    Pertinent  Past History:  Pertinent Past History Chronic history of COPD, lives on 2.5 liters of oxygen at home, obesity, anxiety, ongoing tobacco abuse, GERD.   Hospital Course:  Hospital Course A 59 year old male presenting to the Emergency Department with a chief complaint of shortness of breath and cough for 2 to 3 days, recently diagnosed with urinary tract infection, taking Macrobid, admitted with the following assessment and plan   1.  Acute on chronic resp failure due to acute on chronic chronic obstructive pulmonary disease flare- nebs, steroids, iv antibiotic. CT chest neg for PE but showed severe emphysema, pulmo c/s appreciated. Improving now- on baseline Oxygen use.    2. Acute bronchitis, h/o esbl and Methacillin Resistant Staph aureus, sputum cx this admission-  Normal flora, On broad spectrum Abx- for 5 days-  swich to oral levaquin and discharge   3. Chronic obstructive pulmonary disease exacerbation: continue iv solumdedrol , nebs, continue spiriva,  advair, oxygen, ABx.  4. Gastroesophageal reflux disease.  continue ranitidine    5. DVT proph lovenox PT suggest Home health, PT- pt had services with [facility] before admission. D/c today.   Condition on Discharge Stable   Code Status:  Code Status Full Code   DISCHARGE INSTRUCTIONS HOME MEDS:  Medication Reconciliation: Patient's Home Medications at Discharge:     Medication Instructions  theophylline 200 mg oral tablet, extended release  1 tab(s) orally 2 times a day    advair diskus 250 mcg-50 mcg inhalation powder  1 puff(s) inhaled 2 times a day   alprazolam 0.5 mg oral tablet  1 tab(s) orally 3 times a day   furosemide 20 mg oral tablet  1 tab(s) orally as needed   ventolin hfa 90 mcg/inh inhalation aerosol  2 puff(s) inhaled every 4 hours, As Needed - for Shortness of Breath   veramyst 27.5 mcg/inh nasal spray  2 spray(s)  nasal once a day   albuterol-ipratropium 2.5 mg-0.5 mg/3 ml inhalation solution  3 milliliter(s) inhaled every 4 hours, As Needed - for Shortness of Breath   diltiazem 120 mg/24 hours oral tablet, extended release  1 tab(s) orally once a day   potassium chloride 10 meq oral tablet, extended release  1 tab(s) orally once a day, As Needed for swelling   lexapro 5 mg oral tablet  5 milligram(s) orally once a day (at bedtime)   tiotropium 18 mcg inhalation capsule  1 cap(s) inhaled three times a day   prednisone 10 mg oral tablet  Start at 60 mg and taper by 10 mg daily until complete   levofloxacin 750 mg oral tablet  1 tab(s) orally every 24 hours x 4 days    STOP TAKING THE FOLLOWING MEDICATION(S):    ertapenem: 1 gram(s)  every 24 hours azithromycin 500 mg oral tablet: 1 tab(s) orally every 24 hours linezolid 600 mg oral tablet: 1 tab(s) orally every 12 hours  Physician's Instructions:  Home Health? Yes   [facility]  Nurse   Home Oxygen? Yes   Portable Tank? Yes   Oxygen delivery at home: 2L  Nasal Cannula   Diet Regular   Activity Limitations As tolerated   Return to Work Not Applicable   Time frame for Follow Up Appointment 1-2 weeks   Other Comments follow with Dr. Humphrey Rolls in 1 week.   Electronic Signatures: Vaughan Basta (MD)  (Signed 27-Jun-15 13:32)  Authored: ADMISSION DATE AND DIAGNOSIS, CHIEF COMPLAINT/HPI, Allergies, PERTINENT LABS, PERTINENT RADIOLOGY STUDIES, PERTINENT PAST HISTORY, HOSPITAL COURSE, DISCHARGE  INSTRUCTIONS HOME MEDS, PATIENT INSTRUCTIONS   Last Updated: 27-Jun-15 13:32 by Vaughan Basta (MD)

## 2015-01-09 NOTE — H&P (Signed)
PATIENT NAME:  Dustin [name], Dustin [name] DATE OF BIRTH:  1956-01-21  DATE OF ADMISSION:  01/13/2014  PRIMARY CARE PHYSICIAN: Dr. Beverely RisenFozia Khan   PRIMARY PULMONOLOGIST: Dr. Freda MunroSaadat Khan   REFERRING EMERGENCY ROOM PHYSICIAN: Dr. Sharma CovertNorman  CHIEF COMPLAINT: Shortness of breath.   HISTORY OF PRESENT ILLNESS: This is a [age] year old male with past medical history of COPD, on chronic oxygen use, 2 liters at home, continues to smoke, and obesity. For the last 2 weeks, he said that he started feeling somewhat short of breath on and off, but there were some good days and bad days, but for the last 3 days he started having fever also with that. Fever was running up to 101 to 102 degrees Fahrenheit. He also started having cough with some sputum production, and he had cramps in his body and had some pain in his ribs, so we started to just decrease his activities, but he continued having the same. He also had burning in the urine and increased frequency for the last 2 to 3 days. He finally decided to come to the Emergency Room today. On further questioning, he denies any sick contacts. He agrees that he has some sputum production which is yellowish and sticky. He did not try any medication to get rid of the symptoms, but finally came to the Emergency Room. Found having positive UA and peribronchial thickening on chest x-ray, with some hypoxia, so given as admission.   REVIEW OF SYSTEMS:   CONSTITUTIONAL: Positive for fever and fatigue. No weakness, pain or weight loss.  EYES: No blurring, double vision, discharge or redness.  EARS, NOSE, THROAT: No tinnitus, ear pain or hearing loss.  RESPIRATORY: The patient has some cough and somewhat short of breath.  CARDIOVASCULAR: Has some diffuse chest pain, which is more with cough. No orthopnea, edema, arrhythmia, palpitations.  GASTROINTESTINAL: No nausea, vomiting, diarrhea, abdominal pain.  GENITOURINARY: The patient had increased frequency of urination, and  burning in the urine.  ENDOCRINE: No increased heat or cold intolerance. No increased sweating.  MUSCULOSKELETAL: No pain or swelling in the joints.  NEUROLOGICAL: No numbness, weakness, tremor or vertigo.  PSYCHIATRIC: No anxiety, insomnia, bipolar disorder.   PAST MEDICAL HISTORY: 1.  Chronic hypoxic respiratory failure with COPD, with 2 liters oxygen use at home.  2.  Obesity.  3.  Anxiety.  4.  Ongoing tobacco abuse.   PAST SURGICAL HISTORY:  1.  Shoulder surgery in 2000.  2.  Elbow repair surgery in 1996.  3.  Sinus surgery in 1972 and 1973.  4.  Reflux surgery in 1995.  5.  Hemorrhoid repair in 1980.  PSYCHOSOCIAL HISTORY:  He lives at home with his wife. He smoked 1/2 to 1 pack for almost 40 years now. He decided to stop it and did not smoke for almost 5 months, but then again because of some stressor he picked up smoking for the last 3 to 4 weeks again.   FAMILY HISTORY: Father had coronary artery disease. Mother had history of osteoarthritis and cancer.   HOME MEDICATIONS: 1.  Veramyst 1 spray nasal once a day.  2.  Ventolin 2 puffs inhalation every 4 hours as needed for shortness of breath.  3.  Theophylline 200 mg extended release tablet 1 tablet oral twice a day.  4.  Potassium chloride 10 mEq oral extended release once a day as needed.  5.  Furosemide 20 mg oral once a day as needed for swelling.  6.  Cardizem 120  mg 24-hour extended release tablet once a day.  7.  Alprazolam 0.5 mg oral tablet 3 times a day.  8.  Albuterol and ipratropium inhalation 3 mL every 4 hours as needed.  9.  Advair Diskus 1 puff inhalation 2 times a day.   VITAL SIGNS: In the ER, temperature 98.2, pulse 105, respirations 20, blood pressure 125/74, pulse ox is dropping to 89 on walking, but when he is resting he is comfortable at 96 with oxygen supplementation.   PHYSICAL EXAMINATION:  GENERAL: The patient is fully alert and oriented to time, place, and person. Does not appear in any acute  distress.  HEENT: Head and neck atraumatic. Conjunctivae pink. Oral mucosa moist.  NECK: Supple. No JVD.  RESPIRATORY: Bilateral equal air entry. There is expiratory wheezing present.  CARDIOVASCULAR: S1, S2 present. Regular. No murmur.  ABDOMEN: Soft, nontender. Bowel sounds present. No organomegaly.  SKIN: No rashes.  LEGS: No edema.  NEUROLOGICAL: Power 5/5. Follows commands. Moves all 4 limbs. JOINTS:  No swelling or tenderness.  PSYCHIATRIC: Does not appear in any acute distress.   IMPORTANT LAB RESULTS: Chest x-ray PA and lateral shows slightly increased bronchial wall thickening in bilateral lower lobes. Finding may represent acute on chronic bronchitis. COPD and emphysema. Chronic bibasilar atelectasis and scar, very similar compared to prior imaging. Glucose is 122, BUN is 8, creatinine 1.09, sodium 141, potassium is 3.9, chloride is 107, CO2 is 28. Calcium is 9.1. Troponin is less than 0.02. WBC 12.9, hemoglobin is 14.3, platelet count is 187, MCV is 96. Urinalysis is positive  WBC, 3+ leukocyte esterase.   ASSESSMENT AND PLAN: A 59 year old male with past medical history of chronic obstructive pulmonary disease and chronic respiratory failure, with anxiety and ongoing tobacco abuse, on home oxygen use, presented to Emergency Room for the last 2 weeks of worsening respiratory status, with 3 days of fever, and now with generalized weakness, some cough and phlegm, with burning on urination.   1.  Acute on chronic respiratory failure. The patient is on 2 liters home oxygen use. Currently feeling short of breath on walking with oxygen. This is due to COPD exacerbation, most likely acute bronchitis. Will treat with IV Solu-Medrol and give Spiriva, DuoNeb, and continue his theophylline. Give antibiotic Rocephin and azithromycin.   2.  Acute chronic obstructive pulmonary disease exacerbation, as mentioned above.  3.  Urinary tract infection. Will treat with Rocephin IV and urine culture for  further guiding the treatment.   4.  Anxiety. Continue Xanax.  5.  Smoking. Smoking cessation counseling was done for 4 minutes, and patient agreed to stop smoking. He does not require any nicotine patch right now.   Total time spent on this admission is 50 minutes.    ____________________________ Hope Pigeon Elisabeth Pigeon, MD vgv:[name] D: 01/13/2014 18:56:09 ET T: 01/13/2014 19:10:22 ET JOB#: [id number]  cc: Hope Pigeon. Elisabeth Pigeon, MD, <Dictator> Lyndon Code, MD  [facility] MD ELECTRONICALLY SIGNED 01/26/2014 22:19

## 2015-01-09 NOTE — H&P (Signed)
PATIENT NAME:  Dustin [name], Dustin [name] MR#:  [id number] DATE OF BIRTH:  15-May-1956  DATE OF ADMISSION:  03/07/2014  PRIMARY CARE PHYSICIAN:  Dr. Beverely Risen.   REFERRING PHYSICIAN:  Dr. Manson Passey.   CHIEF COMPLAINT:  Shortness of breath associated with cough.   HISTORY OF PRESENT ILLNESS:  The patient is a 59 year old male with a past medical history of COPD, chronic respiratory failure, placed on 2.5 liters of oxygen, is coming to the ED with a chief complaint of shortness of breath associated with cough for the past 2 to 3 days.  The patient denies any chest tightness or chest pain, but he continues to smoke.  Denies any fever, but he feels hot.  In fact, the patient was seen by primary care physician, diagnosed with a UTI and the patient is started on Macrobid for UTI.  In the ED, the patient was given nebulizer treatments, but as he was still short of breath and wheezing the patient was given one dose of Solu-Medrol 125 mg IV.  As he was recently admitted to the hospital, the patient was started on IV Zosyn and vancomycin by the ER physician for possible pneumonia.  The patient is tachycardic.  His heart rate is running at around 125.  During my examination, the patient is reporting that his shortness of breath is slightly better, but still is coughing.  Wife is at bedside.  No sick contacts.  No other complaints.   REVIEW OF SYSTEMS: CONSTITUTIONAL:  The patient is feeling hot, but not quite sure whether he has fever or not.  Complaining of fatigue and weakness.  EYES:  Denies blurred vision, double vision.  EARS, NOSE, THROAT:  Denies epistaxis, discharge, tinnitus.  RESPIRATORY:  Complaining of cough and shortness of breath.  He has chronic respiratory failure and lives on 2.5 liters of oxygen.  CARDIOVASCULAR:  Denies any chest pain, but chest hurts while coughing.  Denies any peripheral edema, palpitations.  GASTROINTESTINAL:  Denies nausea, vomiting, diarrhea, abdominal pain.  GENITOURINARY:  No  urinary frequency, urgency, or dysuria.  ENDOCRINE:  Denies polyuria, nocturia, heat or cold intolerance.  MUSCULOSKELETAL:  Denies any joint pain.  Denies any arthritis or gout.  NEUROLOGIC:  Denies any vertigo, ataxia, TIA.  PSYCHIATRIC:  Denies any anxiety, insomnia, ADD, OCD.   PAST MEDICAL HISTORY:  Chronic history of COPD, lives on 2.5 liters of oxygen at home, obesity, anxiety, ongoing tobacco abuse, GERD.   PAST SURGICAL HISTORY:  Shoulder surgery in the year 2000, elbow repair surgery in 1996, sinus surgery in 1972 and 1973, acid reflux surgery in 1995.   ALLERGIES:  ALLERGIC TO ASPIRIN, CODEINE, IBUPROFEN.   PSYCHOSOCIAL HISTORY:  Lives at home with wife.  Smokes half to 1 pack a day.  Has a 40 year smoking history.  Occasional intake of alcohol.  Denies any illicit drug usage.   FAMILY HISTORY:  Father had coronary artery disease.  Mother had history of osteoarthritis and cancer.   HOME MEDICATIONS:  Zolpidem 5 mg by mouth once daily, Ventolin 2 puffs inhalation every four hours as needed basis, tiotropium 1 cap inhalation once daily, theophylline 200 mg by mouth twice daily, prednisone tapering dose, potassium chloride 10 mg by mouth once daily.  The patient denies taking linezolid.  Lexapro 5 mg 1 tablet once daily, furosemide 20 mg once a day.  Diltiazem 120 mg extended release 1 tablet by mouth once daily, alprazolam 0.5 mg 3 times a day, Advair Diskus 250/50 1 puff inhalation 2  times a day.  He had a list.  It shows the patient is on ertapenem, linezolid, azithromycin.  These need to be clarified by the pharmacy.    PHYSICAL EXAMINATION:  VITAL SIGNS:  Temperature 98.6, pulse 130, during my examination respiratory rate is at 26,  blood pressure 155/96, pulse ox 91% to 92% on 2.5 liters of oxygen.  GENERAL APPEARANCE:  Not under acute distress, but still becoming short of breath with minimal exertion.  HEENT:  Normocephalic, atraumatic.  Pupils are equally reacting to light and  accommodation.  No scleral icterus.  No conjunctival injection.  No sinus tenderness.  Moist mucous membranes.   NECK:  Supple.  No JVD.  Range of motion is intact.  No thyromegaly.  LUNGS:  Distant breath sounds with moderate air entry, positive end expiratory wheezing.  No crackles.  Bronchial breath sounds.  CARDIAC:  S1, S2 normal.  Regular rate and rhythm, tachycardic.  No peripheral edema.  GASTROINTESTINAL:  Soft.  Bowel sounds are positive in all four quadrants.  Nontender, nondistended.  No hepatosplenomegaly.  No masses felt.  NEUROLOGIC:  Awake, alert, oriented x 3.  Cranial nerves II through XII are intact.  Motor and sensory are intact.  Reflexes are 2+.  EXTREMITIES:  No edema.  No cyanosis.  No clubbing.  SKIN:  Warm to touch.  Normal turgor.  No rashes.  No lesions.  MUSCULOSKELETAL:  No joint effusion, tenderness, erythema.  PSYCHIATRIC:  Normal mood and affect.   LABORATORY AND IMAGING STUDIES:  Chest x-ray PA and lateral views:  Emphysematous changes, increased linear densities at the lung base suggesting worsening of bronchitis.  A 12-lead EKG:  Sinus tachycardia with incomplete right bundle branch block.  Heart rate is at around 130s.  Troponin less than 0.02.  WBC 13.5, hemoglobin, hematocrit and platelets are normal.  BMP is normal except anion gap at 3, CO2 at 33, and glucose at 110.   ASSESSMENT AND PLAN:  A 59 year old male presenting to the Emergency Department with a chief complaint of shortness of breath and cough for the past 2 to 3 days, recently diagnosed with urinary tract infection, taking Macrobid, will be admitted with the following assessment and plan.  1.  Acute respiratory distress secondary to acute bronchitis.  We will admit him to off unit telemetry bed.  We will provide him oxygen via nasal cannula to titrate his pulse oximetry to 92%.  2.  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   We will start him on Zosyn and Levaquin.  Also, we will provide him Solu-Medrol for bronchoconstriction.  3.  Chronic obstructive pulmonary disease exacerbation, probably from acute bronchitis with bronchoconstriction.  We will provide him intravenous Solu-Medrol, nebulizer treatments and albuterol as needed basis q. 4 hours for shortness of breath.  4.  Gastroesophageal reflux disease.  We will provide him ranitidine.   5.  Still continues to smoke.  The patient will be on nicotine patch.  6.  We will provide gastrointestinal and deep vein thrombosis prophylaxis.  7.  CODE STATUS:  HE IS FULL CODE.  Wife is the medical power of attorney.   Plan of care discussed with the patient and his wife at bedside.  They both understand understanding of the plan.   Total time spent on the admission is 50 minutes.    ____________________________ Ramonita Lab, MD ag:ea D: 03/07/2014 02:45:29 ET T: 03/07/2014 03:21:59 ET JOB#: [id number]  cc:  Ramonita LabAruna Gouru, MD, <Dictator> Lyndon CodeFozia M. Khan, MD Ramonita LabARUNA GOURU MD ELECTRONICALLY SIGNED 03/08/2014 7:27

## 2015-01-10 NOTE — H&P (Signed)
PATIENT NAME:  Dustin [name], Samarth R MR#:  [id number] DATE OF BIRTH:  August 20, 1956  DATE OF ADMISSION:  02/24/2012  PRIMARY CARE PHYSICIAN: Orson AloeMichael Blocker, MD  PULMONOLOGIST: Freda MunroSaadat Khan, MD  CHIEF COMPLAINT: Shortness of breath for 2 to 3 days.  HISTORY OF PRESENT ILLNESS: [name] is a 59 year old Caucasian gentleman with chronic respiratory failure secondary to chronic obstructive pulmonary disease, on home oxygen, comes to the Emergency Room after he started having increasing shortness of breath, not able to get better after using oral inhalers at home. In the Emergency Room, he received a dose of Solu-Medrol and Levaquin and received a few nebulizer treatments. He did not feel better. He remained tachycardic with heart rate in the 110s to 120s. His chest x-ray is negative. He is being admitted for chronic obstructive pulmonary disease flare with possible acute bronchitis. He denies any fever at home.   PAST MEDICAL HISTORY:  1. Chronic hypoxic respiratory failure due to chronic obstructive pulmonary disease, on home oxygen.  2. Ongoing tobacco abuse.  3. Morbid obesity.  4. Anxiety.   SOCIAL HISTORY: He lives at home. He continues to smoke about 1/2 pack a day. He denies any history of alcohol abuse.   PAST SURGICAL HISTORY:  1. Hemorrhoid surgery in 1980s. 2. Sinus surgery in 1972 and 1973.  3. Elbow repair in 1996. 4. Shoulder repair in 2000. 5. Reflux surgery in 1995.   ALLERGIES: Ibuprofen, aspirin, and codeine.   FAMILY HISTORY: Mother with arthritis and cancer. Father with coronary artery disease.   REVIEW OF SYSTEMS: CONSTITUTIONAL: Positive for fatigue and weakness. No fever.  EYES: No blurred or double vision. No glaucoma. EARS: No tinnitus, ear pain, or hearing loss. RESPIRATORY: Positive for cough, shortness of breath, and chronic obstructive pulmonary disease and wheeze. CARDIOVASCULAR: Positive chest tightness. Positive for tachycardia. GASTROINTESTINAL: No nausea,  vomiting, diarrhea, or abdominal pain. GU: No dysuria, hematuria, or frequency. ENDOCRINE: No polyuria, nocturia, or thyroid problems. HEMATOLOGY: No anemia or easy bruising. SKIN: No acne or rash. MUSCULOSKELETAL: Positive for arthritis. NEUROLOGIC: No cerebrovascular accident or transient ischemic attack or dysarthria. PSYCH: Positive for anxiety. No depression. All other systems reviewed and negative.   PHYSICAL EXAMINATION:   GENERAL: The patient is awake, alert, and oriented x3, not in acute distress.   VITAL SIGNS: Afebrile, pulse 124 and regular, blood pressure 117/74, and saturations 93% on 2 liters.   HEENT: Atraumatic, normocephalic. Pupils are equal, round, and reactive to light and accommodation. Extraocular movements intact. Oral mucosa is moist.   NECK: Supple. No JVD. No carotid bruits.   LUNGS: Clear to auscultation bilaterally. Decreased breath sounds in the bases. No wheezing. I could hear good air movement bilaterally, other than decrease in the bases. No crackles heard.   HEART: Tachycardia present. No murmur heard. PMI is not lateralized. Chest is nontender.   EXTREMITIES: Good pedal pulses. Good femoral pulses. No lower extremity edema.   ABDOMEN: Obese, soft, benign, and nontender. No organomegaly. Positive bowel sounds.   NEUROLOGIC: Grossly intact cranial nerves II through XII. No motor or sensory deficits.   PSYCHIATRIC: The patient is awake, alert, and oriented x3, appears a little bit anxious.   LABS/RADIOLOGIC STUDIES: Chest x-ray: No acute disease of the chest.   CBC and basic metabolic panel within normal limits. B-type natriuretic peptide 184. Cardiac enzymes, first set, negative.   EKG shows sinus tachycardia.   ASSESSMENT: 59 year old [name] with:  1. Acute on chronic respiratory failure with chronic obstructive pulmonary disease flare.  We will admit the patient to off unit telemetry floor, start on IV Solu-Medrol, around-the-clock nebulizers,  oral inhalers, continue theophylline, and continue oxygen. Pulmonary consultation as needed.  2. Tachycardia, likely in the setting of respiratory distress. Continue to monitor.  3. Acute bronchitis. We will start the patient empirically on IV Levaquin. Followup sputum cultures. Chest x-ray negative for pneumonia.  4. Ongoing tobacco abuse. Smoking cessation counseling was done. The patient does not seem motivated. About three minutes spent for counseling.  5. Morbid obesity. Lifestyle changes and exercise explained. Again, the patient does not seem to be motivated.  6. Deep vein thrombosis prophylaxis with subcutaneous heparin.  7. Continue the rest of the home medications.   Further work-up according to the patient's clinical course. The hospital admission plan was discussed with the patient. No family members were present in the Emergency Room.   TIME SPENT: 45 minutes. ____________________________ Wylie Hail Allena Katz, MD sap:slb D: 02/24/2012 20:07:24 ET     T: 02/25/2012 10:54:42 ET        JOB#: [id number] cc: Hazel Leveille A. Allena Katz, MD, <Dictator> Rosalyn Gess. Blocker, MD Yevonne Pax, MD Willow Ora MD ELECTRONICALLY SIGNED 03/09/2012 15:46

## 2015-01-10 NOTE — Discharge Summary (Signed)
PATIENT NAME:  Dustin CoriaWINBURN, Leilan R MR#:  [id number] DATE OF BIRTH:  1956/04/15  DATE OF ADMISSION:  02/26/2012 DATE OF DISCHARGE:  03/10/2012  THIS DICTATION SHOULD COVER FROM 03/09/2012 TO 03/10/2012. PLEASE SEE THE PREVIOUSLY DICTATED INTERIM DISCHARGE SUMMARIES BY DR. Chales AbrahamsGUPTA AND ONE WHICH WOULD BE DICTATED BY DR. LATEEF FOR PREVIOUS HOSPITALIZATION PRIOR TO THE CURRENT DICTATION TIMES.    DISCHARGE DIAGNOSES:  1. Acute on chronic respiratory failure from chronic obstructive pulmonary disease exacerbation and acute bronchitis.  2. Anxiety.  3. Tobacco abuse.  4. Diastolic congestive heart failure.  5. Thrush.   SECONDARY DIAGNOSES:  1. Morbid obesity.  2. Chronic hypoxic respiratory failure from chronic obstructive pulmonary disease on home oxygen nocturnally.   DISCHARGE MEDICATIONS:  1. Prednisone taper 30 mg daily for a day, then 20 mg daily for a day, then 10 mg daily for a day, then stop. 2. Theophylline 200 mg two times a day extended release.  3. Acetaminophen 650 mg 1 tab as needed every six hours for pain or fever.  4. Advair 250/50 mcg 1 puff inhaled twice a day.  5. Spiriva 18 mcg inhaled one cap daily.  6. Alprazolam 0.5 mg t.i.d.  7. Oxygen 1.5 liters at bedtime.  8. Lasix 20 mg once a day as needed, take with K-Dur.  9. K-Dur 10 mEq 1 tab once a day as needed, take along with furosemide.  10. Ventolin HFA 90 mcg inhaled 2 puffs every four hours as needed for shortness of breath.  11. Nystatin 100,000 units/mL, take 5 mL q.6 hours for five days.   ACTIVITY: As tolerated.   DIET: Low sodium.   FOLLOW UP: Please follow up with your primary pulmonologist, Dr. Welton FlakesKhan, within 1 to 2 weeks and please follow up within 1 week with your primary care physician for blood pressure check.   DISPOSITION: Home.   HOSPITAL COURSE: Since 06/22 patient has had improvement in his breathing and wheezing. He is currently on a prednisone taper and he will be discharged on 30 mg dose of  prednisone. He ambulated yesterday without oxygen and his stats remained at 90% which does not require home continuous oxygen. He is afebrile. He is slightly tachycardic. He is asymptomatic otherwise. His lungs have regained good aeration without wheezing and he is not short of breath. He has finished his antibiotics for his bronchitis already. He does have diastolic dysfunction seen on the echocardiogram done during this hospitalization although on exam he is not volume overloaded. Therefore, he would not be discharged on daily Lasix and he can take Lasix p.r.n. for increased lower extremity edema, shortness of breath. He should follow up with Dr. Welton FlakesKhan, his primary pulmonologist, and his primary care physician.   TOTAL TIME SPENT: 35 minutes.   CODE STATUS: Patient is FULL CODE.   ____________________________ Krystal EatonShayiq Teandra Harlan, MD sa:cms D: 03/10/2012 14:02:53 ET T: 03/10/2012 15:44:07 ET JOB#: [id number]  cc: Krystal EatonShayiq Kayelyn Lemon, MD, <Dictator> Krystal EatonSHAYIQ Lasheena Frieze MD ELECTRONICALLY SIGNED 03/23/2012 13:07

## 2015-01-12 LAB — EXPECTORATED SPUTUM ASSESSMENT W REFEX TO RESP CULTURE

## 2015-01-21 DIAGNOSIS — J441 Chronic obstructive pulmonary disease with (acute) exacerbation: Secondary | ICD-10-CM | POA: Diagnosis not present

## 2015-01-21 DIAGNOSIS — R911 Solitary pulmonary nodule: Secondary | ICD-10-CM | POA: Diagnosis not present

## 2015-01-21 DIAGNOSIS — F17210 Nicotine dependence, cigarettes, uncomplicated: Secondary | ICD-10-CM | POA: Diagnosis not present

## 2015-01-21 DIAGNOSIS — J9611 Chronic respiratory failure with hypoxia: Secondary | ICD-10-CM | POA: Diagnosis not present

## 2015-01-26 DIAGNOSIS — J471 Bronchiectasis with (acute) exacerbation: Secondary | ICD-10-CM | POA: Diagnosis not present

## 2015-02-02 DIAGNOSIS — J441 Chronic obstructive pulmonary disease with (acute) exacerbation: Secondary | ICD-10-CM | POA: Diagnosis not present

## 2015-02-02 DIAGNOSIS — N390 Urinary tract infection, site not specified: Secondary | ICD-10-CM | POA: Diagnosis not present

## 2015-02-02 DIAGNOSIS — J9611 Chronic respiratory failure with hypoxia: Secondary | ICD-10-CM | POA: Diagnosis not present

## 2015-02-02 DIAGNOSIS — F17210 Nicotine dependence, cigarettes, uncomplicated: Secondary | ICD-10-CM | POA: Diagnosis not present

## 2015-02-02 DIAGNOSIS — J432 Centrilobular emphysema: Secondary | ICD-10-CM | POA: Diagnosis not present

## 2015-02-02 DIAGNOSIS — J189 Pneumonia, unspecified organism: Secondary | ICD-10-CM | POA: Diagnosis not present

## 2015-02-11 ENCOUNTER — Other Ambulatory Visit
Admission: RE | Admit: 2015-02-11 | Discharge: 2015-02-11 | Disposition: A | Discharge status: Home / Self Care | Payer: Medicare Other | Source: Ambulatory Visit | Attending: Physician Assistant | Admitting: Physician Assistant

## 2015-02-11 DIAGNOSIS — J9611 Chronic respiratory failure with hypoxia: Secondary | ICD-10-CM | POA: Insufficient documentation

## 2015-02-11 DIAGNOSIS — J432 Centrilobular emphysema: Secondary | ICD-10-CM | POA: Diagnosis not present

## 2015-02-11 DIAGNOSIS — J189 Pneumonia, unspecified organism: Secondary | ICD-10-CM | POA: Insufficient documentation

## 2015-02-11 LAB — EXPECTORATED SPUTUM ASSESSMENT W REFEX TO RESP CULTURE

## 2015-02-11 LAB — EXPECTORATED SPUTUM ASSESSMENT W GRAM STAIN, RFLX TO RESP C

## 2015-02-16 LAB — CULTURE, RESPIRATORY

## 2015-02-16 LAB — CULTURE, RESPIRATORY W GRAM STAIN

## 2015-02-18 ENCOUNTER — Other Ambulatory Visit: Payer: Self-pay | Admitting: Internal Medicine

## 2015-02-18 DIAGNOSIS — R911 Solitary pulmonary nodule: Secondary | ICD-10-CM

## 2015-02-18 DIAGNOSIS — N390 Urinary tract infection, site not specified: Secondary | ICD-10-CM | POA: Diagnosis not present

## 2015-02-26 DIAGNOSIS — J471 Bronchiectasis with (acute) exacerbation: Secondary | ICD-10-CM | POA: Diagnosis not present

## 2015-03-01 DIAGNOSIS — N401 Enlarged prostate with lower urinary tract symptoms: Secondary | ICD-10-CM | POA: Diagnosis not present

## 2015-03-01 DIAGNOSIS — R7301 Impaired fasting glucose: Secondary | ICD-10-CM | POA: Diagnosis not present

## 2015-03-01 DIAGNOSIS — J9611 Chronic respiratory failure with hypoxia: Secondary | ICD-10-CM | POA: Diagnosis not present

## 2015-03-01 DIAGNOSIS — F17210 Nicotine dependence, cigarettes, uncomplicated: Secondary | ICD-10-CM | POA: Diagnosis not present

## 2015-03-01 DIAGNOSIS — J432 Centrilobular emphysema: Secondary | ICD-10-CM | POA: Diagnosis not present

## 2015-03-01 DIAGNOSIS — J15212 Pneumonia due to Methicillin resistant Staphylococcus aureus: Secondary | ICD-10-CM | POA: Diagnosis not present

## 2015-03-09 ENCOUNTER — Ambulatory Visit
Admission: RE | Admit: 2015-03-09 | Discharge: 2015-03-09 | Disposition: A | Discharge status: Home / Self Care | Payer: Medicare Other | Source: Ambulatory Visit | Attending: Internal Medicine | Admitting: Internal Medicine

## 2015-03-17 ENCOUNTER — Ambulatory Visit
Admission: RE | Admit: 2015-03-17 | Discharge: 2015-03-17 | Disposition: A | Discharge status: Home / Self Care | Payer: Medicare Other | Source: Ambulatory Visit | Attending: Internal Medicine | Admitting: Internal Medicine

## 2015-03-17 DIAGNOSIS — J439 Emphysema, unspecified: Secondary | ICD-10-CM | POA: Diagnosis not present

## 2015-03-17 DIAGNOSIS — R911 Solitary pulmonary nodule: Secondary | ICD-10-CM | POA: Insufficient documentation

## 2015-03-17 DIAGNOSIS — J432 Centrilobular emphysema: Secondary | ICD-10-CM | POA: Diagnosis not present

## 2015-03-17 DIAGNOSIS — I313 Pericardial effusion (noninflammatory): Secondary | ICD-10-CM | POA: Insufficient documentation

## 2015-03-17 HISTORY — DX: Unspecified asthma, uncomplicated: J45.909

## 2015-03-17 MED ORDER — IOHEXOL 300 MG/ML  SOLN
75.0000 mL | Freq: Once | INTRAMUSCULAR | Status: AC | PRN
  Administered 2015-03-17: 75 mL via INTRAVENOUS

## 2015-03-25 DIAGNOSIS — I5022 Chronic systolic (congestive) heart failure: Secondary | ICD-10-CM | POA: Diagnosis not present

## 2015-03-25 DIAGNOSIS — R0602 Shortness of breath: Secondary | ICD-10-CM | POA: Diagnosis not present

## 2015-03-28 DIAGNOSIS — J471 Bronchiectasis with (acute) exacerbation: Secondary | ICD-10-CM | POA: Diagnosis not present

## 2015-03-31 ENCOUNTER — Inpatient Hospital Stay
Admission: EM | Admit: 2015-03-31 | Discharge: 2015-04-05 | DRG: 177 | Disposition: A | Discharge status: Home / Self Care | Payer: Medicare Other | Attending: Specialist | Admitting: Specialist

## 2015-03-31 ENCOUNTER — Emergency Department: Payer: Medicare Other

## 2015-03-31 ENCOUNTER — Encounter: Payer: Self-pay | Admitting: Emergency Medicine

## 2015-03-31 DIAGNOSIS — Z833 Family history of diabetes mellitus: Secondary | ICD-10-CM | POA: Diagnosis not present

## 2015-03-31 DIAGNOSIS — K219 Gastro-esophageal reflux disease without esophagitis: Secondary | ICD-10-CM | POA: Diagnosis not present

## 2015-03-31 DIAGNOSIS — J15212 Pneumonia due to Methicillin resistant Staphylococcus aureus: Principal | ICD-10-CM | POA: Diagnosis present

## 2015-03-31 DIAGNOSIS — R0602 Shortness of breath: Secondary | ICD-10-CM | POA: Diagnosis not present

## 2015-03-31 DIAGNOSIS — Z87891 Personal history of nicotine dependence: Secondary | ICD-10-CM | POA: Diagnosis not present

## 2015-03-31 DIAGNOSIS — Z888 Allergy status to other drugs, medicaments and biological substances status: Secondary | ICD-10-CM | POA: Diagnosis not present

## 2015-03-31 DIAGNOSIS — I10 Essential (primary) hypertension: Secondary | ICD-10-CM | POA: Diagnosis not present

## 2015-03-31 DIAGNOSIS — R0902 Hypoxemia: Secondary | ICD-10-CM | POA: Diagnosis not present

## 2015-03-31 DIAGNOSIS — Z9889 Other specified postprocedural states: Secondary | ICD-10-CM

## 2015-03-31 DIAGNOSIS — Z91041 Radiographic dye allergy status: Secondary | ICD-10-CM | POA: Diagnosis not present

## 2015-03-31 DIAGNOSIS — J9621 Acute and chronic respiratory failure with hypoxia: Secondary | ICD-10-CM | POA: Diagnosis not present

## 2015-03-31 DIAGNOSIS — J449 Chronic obstructive pulmonary disease, unspecified: Secondary | ICD-10-CM | POA: Diagnosis not present

## 2015-03-31 DIAGNOSIS — J45909 Unspecified asthma, uncomplicated: Secondary | ICD-10-CM | POA: Diagnosis not present

## 2015-03-31 DIAGNOSIS — F329 Major depressive disorder, single episode, unspecified: Secondary | ICD-10-CM | POA: Diagnosis present

## 2015-03-31 DIAGNOSIS — F419 Anxiety disorder, unspecified: Secondary | ICD-10-CM | POA: Diagnosis present

## 2015-03-31 DIAGNOSIS — R911 Solitary pulmonary nodule: Secondary | ICD-10-CM | POA: Diagnosis present

## 2015-03-31 DIAGNOSIS — I509 Heart failure, unspecified: Secondary | ICD-10-CM | POA: Diagnosis present

## 2015-03-31 DIAGNOSIS — J441 Chronic obstructive pulmonary disease with (acute) exacerbation: Secondary | ICD-10-CM | POA: Diagnosis not present

## 2015-03-31 DIAGNOSIS — Z886 Allergy status to analgesic agent status: Secondary | ICD-10-CM

## 2015-03-31 DIAGNOSIS — Z9981 Dependence on supplemental oxygen: Secondary | ICD-10-CM | POA: Diagnosis not present

## 2015-03-31 DIAGNOSIS — Z8614 Personal history of Methicillin resistant Staphylococcus aureus infection: Secondary | ICD-10-CM | POA: Diagnosis not present

## 2015-03-31 DIAGNOSIS — J9620 Acute and chronic respiratory failure, unspecified whether with hypoxia or hypercapnia: Secondary | ICD-10-CM | POA: Diagnosis not present

## 2015-03-31 HISTORY — DX: Allergy, unspecified, initial encounter: T78.40XA

## 2015-03-31 HISTORY — DX: Chronic obstructive pulmonary disease, unspecified: J44.9

## 2015-03-31 HISTORY — DX: Depression, unspecified: F32.A

## 2015-03-31 HISTORY — DX: Gastro-esophageal reflux disease without esophagitis: K21.9

## 2015-03-31 HISTORY — DX: Emphysema, unspecified: J43.9

## 2015-03-31 HISTORY — DX: Anxiety disorder, unspecified: F41.9

## 2015-03-31 HISTORY — DX: Major depressive disorder, single episode, unspecified: F32.9

## 2015-03-31 LAB — BLOOD GAS, VENOUS
ACID-BASE EXCESS: 3.8 mmol/L — AB (ref 0.0–3.0)
Bicarbonate: 30.9 mEq/L — ABNORMAL HIGH (ref 21.0–28.0)
PATIENT TEMPERATURE: 37
PH VEN: 7.35 (ref 7.320–7.430)
pCO2, Ven: 56 mmHg (ref 44.0–60.0)

## 2015-03-31 LAB — CBC WITH DIFFERENTIAL/PLATELET
Basophils Absolute: 0 10*3/uL (ref 0–0.1)
Basophils Relative: 0 %
EOS ABS: 0.1 10*3/uL (ref 0–0.7)
EOS PCT: 1 %
HCT: 39.9 % — ABNORMAL LOW (ref 40.0–52.0)
Hemoglobin: 13.1 g/dL (ref 13.0–18.0)
Lymphocytes Relative: 9 %
Lymphs Abs: 0.8 10*3/uL — ABNORMAL LOW (ref 1.0–3.6)
MCH: 31.7 pg (ref 26.0–34.0)
MCHC: 32.8 g/dL (ref 32.0–36.0)
MCV: 96.6 fL (ref 80.0–100.0)
Monocytes Absolute: 0.7 10*3/uL (ref 0.2–1.0)
Monocytes Relative: 8 %
Neutro Abs: 7.4 10*3/uL — ABNORMAL HIGH (ref 1.4–6.5)
Neutrophils Relative %: 82 %
PLATELETS: 160 10*3/uL (ref 150–440)
RBC: 4.13 MIL/uL — ABNORMAL LOW (ref 4.40–5.90)
RDW: 14.2 % (ref 11.5–14.5)
WBC: 9 10*3/uL (ref 3.8–10.6)

## 2015-03-31 LAB — BASIC METABOLIC PANEL
Anion gap: 6 (ref 5–15)
BUN: 7 mg/dL (ref 6–20)
CO2: 30 mmol/L (ref 22–32)
Calcium: 8.8 mg/dL — ABNORMAL LOW (ref 8.9–10.3)
Chloride: 102 mmol/L (ref 101–111)
Creatinine, Ser: 0.85 mg/dL (ref 0.61–1.24)
Glucose, Bld: 132 mg/dL — ABNORMAL HIGH (ref 65–99)
Potassium: 3.8 mmol/L (ref 3.5–5.1)
SODIUM: 138 mmol/L (ref 135–145)

## 2015-03-31 MED ORDER — IPRATROPIUM-ALBUTEROL 0.5-2.5 (3) MG/3ML IN SOLN
3.0000 mL | Freq: Once | RESPIRATORY_TRACT | Status: AC
  Administered 2015-03-31: 3 mL via RESPIRATORY_TRACT
  Filled 2015-03-31: qty 3

## 2015-03-31 MED ORDER — ACETAMINOPHEN 325 MG PO TABS: 650.0000 mg | ORAL_TABLET | Freq: Four times a day (QID) | ORAL | Status: DC | PRN

## 2015-03-31 MED ORDER — PREDNISONE 20 MG PO TABS
60.0000 mg | ORAL_TABLET | Freq: Once | ORAL | Status: AC
Start: 2015-03-31 — End: 2015-03-31
  Administered 2015-03-31: 60 mg via ORAL
  Filled 2015-03-31: qty 3

## 2015-03-31 MED ORDER — ACETAMINOPHEN 650 MG RE SUPP
650.0000 mg | Freq: Four times a day (QID) | RECTAL | Status: DC | PRN
Start: 2015-03-31 — End: 2015-04-05

## 2015-03-31 MED ORDER — ALBUTEROL SULFATE (2.5 MG/3ML) 0.083% IN NEBU
5.0000 mg | INHALATION_SOLUTION | Freq: Once | RESPIRATORY_TRACT | Status: AC
  Administered 2015-03-31: 5 mg via RESPIRATORY_TRACT
  Filled 2015-03-31: qty 6

## 2015-03-31 MED ORDER — TIOTROPIUM BROMIDE MONOHYDRATE 18 MCG IN CAPS
18.0000 ug | ORAL_CAPSULE | Freq: Every day | RESPIRATORY_TRACT | Status: DC
  Administered 2015-04-01 – 2015-04-05 (×5): 18 ug via RESPIRATORY_TRACT
  Filled 2015-03-31: qty 5

## 2015-03-31 MED ORDER — ESCITALOPRAM OXALATE 10 MG PO TABS
5.0000 mg | ORAL_TABLET | Freq: Every day | ORAL | Status: DC
  Administered 2015-04-01 – 2015-04-04 (×5): 5 mg via ORAL
  Filled 2015-03-31 (×5): qty 1

## 2015-03-31 MED ORDER — THEOPHYLLINE ER 100 MG PO TB12
200.0000 mg | ORAL_TABLET | Freq: Two times a day (BID) | ORAL | Status: DC
  Administered 2015-03-31 – 2015-04-05 (×10): 200 mg via ORAL
  Filled 2015-03-31 (×4): qty 1
  Filled 2015-03-31: qty 2
  Filled 2015-03-31 (×9): qty 1

## 2015-03-31 MED ORDER — DEXTROSE 5 % IV SOLN
500.0000 mg | INTRAVENOUS | Status: DC
  Administered 2015-03-31 – 2015-04-01 (×2): 500 mg via INTRAVENOUS
  Filled 2015-03-31 (×3): qty 500

## 2015-03-31 MED ORDER — ONDANSETRON HCL 4 MG PO TABS: 4.0000 mg | ORAL_TABLET | Freq: Four times a day (QID) | ORAL | Status: DC | PRN

## 2015-03-31 MED ORDER — MOMETASONE FURO-FORMOTEROL FUM 100-5 MCG/ACT IN AERO
2.0000 | INHALATION_SPRAY | Freq: Two times a day (BID) | RESPIRATORY_TRACT | Status: DC
  Administered 2015-03-31 – 2015-04-05 (×10): 2 via RESPIRATORY_TRACT
  Filled 2015-03-31 (×2): qty 8.8

## 2015-03-31 MED ORDER — MORPHINE SULFATE 2 MG/ML IJ SOLN
2.0000 mg | INTRAMUSCULAR | Status: DC | PRN
Start: 2015-03-31 — End: 2015-04-05

## 2015-03-31 MED ORDER — IPRATROPIUM-ALBUTEROL 0.5-2.5 (3) MG/3ML IN SOLN
3.0000 mL | Freq: Four times a day (QID) | RESPIRATORY_TRACT | Status: DC
  Administered 2015-04-01 – 2015-04-03 (×12): 3 mL via RESPIRATORY_TRACT
  Filled 2015-03-31 (×12): qty 3

## 2015-03-31 MED ORDER — ESCITALOPRAM OXALATE 10 MG PO TABS: 5.0000 mg | ORAL_TABLET | Freq: Every day | ORAL | Status: DC

## 2015-03-31 MED ORDER — SODIUM CHLORIDE 0.9 % IV SOLN
INTRAVENOUS | Status: DC
  Administered 2015-03-31: 22:00:00 via INTRAVENOUS

## 2015-03-31 MED ORDER — METHYLPREDNISOLONE SODIUM SUCC 125 MG IJ SOLR
60.0000 mg | INTRAMUSCULAR | Status: DC
  Administered 2015-03-31: 23:00:00 60 mg via INTRAVENOUS
  Filled 2015-03-31: qty 2

## 2015-03-31 MED ORDER — ALPRAZOLAM 1 MG PO TABS
1.0000 mg | ORAL_TABLET | Freq: Three times a day (TID) | ORAL | Status: DC | PRN
  Administered 2015-04-01 – 2015-04-05 (×10): 1 mg via ORAL
  Filled 2015-03-31 (×10): qty 1

## 2015-03-31 MED ORDER — HEPARIN SODIUM (PORCINE) 5000 UNIT/ML IJ SOLN
5000.0000 [IU] | Freq: Three times a day (TID) | INTRAMUSCULAR | Status: DC
  Administered 2015-03-31 – 2015-04-05 (×14): 5000 [IU] via SUBCUTANEOUS
  Filled 2015-03-31 (×14): qty 1

## 2015-03-31 MED ORDER — ONDANSETRON HCL 4 MG/2ML IJ SOLN: 4.0000 mg | Freq: Four times a day (QID) | INTRAMUSCULAR | Status: DC | PRN

## 2015-03-31 MED ORDER — DILTIAZEM HCL ER COATED BEADS 120 MG PO CP24
120.0000 mg | ORAL_CAPSULE | Freq: Every day | ORAL | Status: DC
  Administered 2015-04-01 – 2015-04-05 (×5): 120 mg via ORAL
  Filled 2015-03-31 (×5): qty 1

## 2015-03-31 NOTE — ED Provider Notes (Signed)
[facility] Emergency Department Provider Note  ____________________________________________  Time seen: 5:15 PM  I have reviewed the triage vital signs and the nursing notes.   HISTORY  Chief Complaint Shortness of Breath    [name] is a 59 y.o. male who complains of worsening shortness of breath and generalized weakness for the past 4 days. He notes that he has severe dyspnea on exertion and can only walk a few feet at present time. He denies recent illness or cough fever chills chest pain abdominal pain nausea vomiting or diarrhea. He normally uses 2 L of oxygen continuously at home.     Past Medical History  Diagnosis Date  . Asthma   . COPD (chronic obstructive pulmonary disease)     There are no active problems to display for this patient.   Past Surgical History  Procedure Laterality Date  . Abdominal surgery    . Nose surgery    . Hemorrh      No current outpatient prescriptions on file. Nasal cannula oxygen Pericardial effusion diagnosed on transthoracic echo by cardiologist Milta Deiters (per patient)  Allergies Asa; Codeine; and Ibuprofen  No family history on file.  Social History History  Substance Use Topics  . Smoking status: Former Games developer  . Smokeless tobacco: Not on file  . Alcohol Use: No    Review of Systems  Constitutional: No fever or chills. No weight changes Eyes:No blurry vision or double vision.  ENT: No sore throat. Cardiovascular: No chest pain. Respiratory: Positive dyspnea Gastrointestinal: Negative for abdominal pain, vomiting and diarrhea.  No BRBPR or melena. Genitourinary: Negative for dysuria, urinary retention, bloody urine, or difficulty urinating. Musculoskeletal: Negative for back pain. No joint swelling or pain. Skin: Negative for rash. Neurological: Negative for headaches, focal weakness or numbness. Psychiatric:No anxiety or depression.   Endocrine:No hot/cold intolerance,  changes in energy, or sleep difficulty.  10-point ROS otherwise negative.  ____________________________________________   PHYSICAL EXAM:  VITAL SIGNS: ED Triage Vitals  Enc Vitals Group     BP 03/31/15 1706 120/59 mmHg     Pulse Rate 03/31/15 1706 95     Resp 03/31/15 1706 24     Temp 03/31/15 1706 98.1 F (36.7 C)     Temp Source 03/31/15 1706 Oral     SpO2 03/31/15 1706 90 %     Weight 03/31/15 1706 196 lb (88.905 kg)     Height 03/31/15 1706  (1.778 m)     Head Cir --      Peak Flow --      Pain Score --      Pain Loc --      Pain Edu? --      Excl. in GC? --      Constitutional: Alert and oriented. Well appearing and in mild distress Eyes: No scleral icterus. No conjunctival pallor. PERRL. EOMI ENT   Head: Normocephalic and atraumatic.   Nose: No congestion/rhinnorhea. No septal hematoma   Mouth/Throat: MMM, no pharyngeal erythema. No peritonsillar mass. No uvula shift.   Neck: No stridor. No SubQ emphysema. No meningismus. Hematological/Lymphatic/Immunilogical: No cervical lymphadenopathy. Cardiovascular: RRR. Normal and symmetric distal pulses are present in all extremities. No murmurs, rubs, or gallops. Nondisplaced PMI Respiratory: Quiet breath sounds with prolonged expirations and diffuse expiratory wheezing. No focal crackles or rales. Gastrointestinal: Soft and nontender. No distention. There is no CVA tenderness.  No rebound, rigidity, or guarding. Genitourinary: deferred Musculoskeletal: Nontender with normal range of motion in  all extremities. No joint effusions.  No lower extremity tenderness.  No edema. Neurologic:   Normal speech and language.  CN 2-10 normal. Motor grossly intact. No pronator drift.  Normal gait. No gross focal neurologic deficits are appreciated.  Skin:  Skin is warm, dry and intact. No rash noted.  No petechiae, purpura, or bullae. Psychiatric: Mood and affect are normal. Speech and behavior are normal. Patient  exhibits appropriate insight and judgment.  ____________________________________________    LABS (pertinent positives/negatives) (all labs ordered are listed, but only abnormal results are displayed) Labs Reviewed  CBC WITH DIFFERENTIAL/PLATELET - Abnormal; Notable for the following:    RBC 4.13 (*)    HCT 39.9 (*)    Neutro Abs 7.4 (*)    Lymphs Abs 0.8 (*)    All other components within normal limits  BLOOD GAS, VENOUS - Abnormal; Notable for the following:    Bicarbonate 30.9 (*)    Acid-Base Excess 3.8 (*)    All other components within normal limits  BASIC METABOLIC PANEL   ____________________________________________   EKG  Interpreted by me Normal sinus rhythm rate of 95, normal axis, normal intervals. Right bundle branch block. ST segments and T waves.  ____________________________________________    RADIOLOGY  Chest x-ray unremarkable  ____________________________________________   PROCEDURES CRITICAL CARE Performed by: Scotty CourtSTAFFORD, Ermine Stebbins   Total critical care time: 35 minutes  Critical care time was exclusive of separately billable procedures and treating other patients.  Critical care was necessary to treat or prevent imminent or life-threatening deterioration.  Critical care was time spent personally by me on the following activities: development of treatment plan with patient and/or surrogate as well as nursing, discussions with consultants, evaluation of patient's response to treatment, examination of patient, obtaining history from patient or surrogate, ordering and performing treatments and interventions, ordering and review of laboratory studies, ordering and review of radiographic studies, pulse oximetry and re-evaluation of patient's condition.  ____________________________________________   INITIAL IMPRESSION / ASSESSMENT AND PLAN / ED COURSE  Pertinent labs & imaging results that were available during my care of the patient were reviewed by  me and considered in my medical decision making (see chart for details).  Patient presents with a likely COPD exacerbation. We'll check a chest x-ray and give steroids and nebulizer treatments and reassess for improvement. He is severely symptomatic and if he is unable to improve significantly, he may require hospitalization.  ----------------------------------------- 7:57 PM on 03/31/2015 -----------------------------------------  During workup and observation in the emergency department, the patient frequently desaturates to 82-83% despite being on 3 L nasal cannula oxygen. She only uses 2 L nasal cannula oxygen at home, so this severe hypoxia in the setting of increased supplemental oxygen use is concerning for hypoxic respiratory failure. We will check labs including a venous blood gas to evaluate for hypercapnia, and plan to admit the patient for continued monitoring of his acute on chronic respiratory failure with hypoxia and acute COPD exacerbation. ----------------------------------------- 8:09 PM on 03/31/2015 -----------------------------------------  VBG consistent with chronic mild hypercapnia. Continue oxygen therapy and nebs, and admit to hospital. ____________________________________________   FINAL CLINICAL IMPRESSION(S) / ED DIAGNOSES  Final diagnoses:  Acute exacerbation of chronic obstructive pulmonary disease (COPD)  Hypoxia   acute on chronic hypoxic respiratory failure    Sharman CheekPhillip Randon Somera, MD 03/31/15 2011

## 2015-03-31 NOTE — H&P (Signed)
[facility] [place] [name]   PATIENT NAME: Dustin PhenixDennis [name]    MR#:  [id number]  DATE OF BIRTH:  1956/09/12   DATE OF ADMISSION:  03/31/2015  PRIMARY CARE PHYSICIAN: Lyndon CodeKHAN, FOZIA M, MD   REQUESTING/REFERRING PHYSICIAN: Scotty CourtStafford  CHIEF COMPLAINT:   Chief Complaint  Patient presents with  . Shortness of Breath    HISTORY OF PRESENT ILLNESS:  Dustin [name]  is a 59 y.o. male with a known history of COPD, chronic respiratory failure 2 L has Baseline presenting with shortness of breath. 4-5 day duration of worsening shortness of breath mainly described as dyspnea on exertion with associated cough, productive greenish sputum denies any fevers or chills or chest pain. Positive sick contacts with his grandchildren prior to onset of symptoms. Denies any orthopnea, lower extremity edema.  PAST MEDICAL HISTORY:   Past Medical History  Diagnosis Date  . Asthma   . COPD (chronic obstructive pulmonary disease)     PAST SURGICAL HISTORY:   Past Surgical History  Procedure Laterality Date  . Abdominal surgery    . Nose surgery    . Hemorrh      SOCIAL HISTORY:   History  Substance Use Topics  . Smoking status: Former Games developermoker  . Smokeless tobacco: Not on file  . Alcohol Use: No    FAMILY HISTORY:   Family History  Problem Relation Age of Onset  . Diabetes Neg Hx     DRUG ALLERGIES:   Allergies  Allergen Reactions  . Asa [Aspirin]   . Codeine   . Ibuprofen     REVIEW OF SYSTEMS:  REVIEW OF SYSTEMS:  CONSTITUTIONAL: Denies fevers, chills, positive fatigue, weakness.  EYES: Denies blurred vision, double vision, or eye pain.  EARS, NOSE, THROAT: Denies tinnitus, ear pain, hearing loss.  RESPIRATORY: Positive cough, shortness of breath, wheezing  CARDIOVASCULAR: Denies chest pain, palpitations, edema.  GASTROINTESTINAL: Denies nausea, vomiting, diarrhea, abdominal pain.  GENITOURINARY: Denies dysuria, hematuria.  ENDOCRINE: Denies  nocturia or thyroid problems. HEMATOLOGIC AND LYMPHATIC: Denies easy bruising or bleeding.  SKIN: Denies rash or lesions.  MUSCULOSKELETAL: Denies pain in neck, back, shoulder, knees, hips, or further arthritic symptoms.  NEUROLOGIC: Denies paralysis, paresthesias.  PSYCHIATRIC: Denies anxiety or depressive symptoms. Otherwise full review of systems performed by me is negative.   MEDICATIONS AT HOME:   Prior to Admission medications   Not on File      VITAL SIGNS:  Blood pressure 121/55, pulse 102, temperature 98.1 F (36.7 C), temperature source Oral, resp. rate 35, height 5\' 10"  (1.778 m), weight 196 lb (88.905 kg), SpO2 89 %.  PHYSICAL EXAMINATION:  VITAL SIGNS: Filed Vitals:   03/31/15 2100  BP: 121/55  Pulse: 102  Temp:   Resp:    GENERAL:59 y.o.male currently in no acute distress.  HEAD: Normocephalic, atraumatic.  EYES: Pupils equal, round, reactive to light. Extraocular muscles intact. No scleral icterus.  MOUTH: Moist mucosal membrane. Dentition intact. No abscess noted.  EAR, NOSE, THROAT: Clear without exudates. No external lesions.  NECK: Supple. No thyromegaly. No nodules. No JVD.  PULMONARY: Greatly diminished breath sounds in all lung fields with scattered expiratory wheezing, No use of accessory muscles, poor respiratory effort. Poor air entry bilaterally CHEST: Nontender to palpation.  CARDIOVASCULAR: S1 and S2. Regular rate and rhythm. No murmurs, rubs, or gallops. No edema. Pedal pulses 2+ bilaterally.  GASTROINTESTINAL: Soft, nontender, nondistended. No masses. Positive bowel sounds. No hepatosplenomegaly.  MUSCULOSKELETAL: No swelling, clubbing, or edema.  Range of motion full in all extremities.  NEUROLOGIC: Cranial nerves II through XII are intact. No gross focal neurological deficits. Sensation intact. Reflexes intact.  SKIN: No ulceration, lesions, rashes, or cyanosis. Skin warm and dry. Turgor intact.  PSYCHIATRIC: Mood, affect within normal limits.  The patient is awake, alert and oriented x 3. Insight, judgment intact.    LABORATORY PANEL:   CBC  Recent Labs Lab 03/31/15 1714  WBC 9.0  HGB 13.1  HCT 39.9*  PLT 160   ------------------------------------------------------------------------------------------------------------------  Chemistries   Recent Labs Lab 03/31/15 1714  NA 138  K 3.8  CL 102  CO2 30  GLUCOSE 132*  BUN 7  CREATININE 0.85  CALCIUM 8.8*   ------------------------------------------------------------------------------------------------------------------  Cardiac Enzymes No results for input(s): TROPONINI in the last 168 hours. ------------------------------------------------------------------------------------------------------------------  RADIOLOGY:  Dg Chest 2 View  03/31/2015   CLINICAL DATA:  Worsening shortness of breath and weakness for past 2 days. Hypoxia. COPD.  EXAM: CHEST  2 VIEW  COMPARISON:  09/02/2014  FINDINGS: Pulmonary hyperinflation and diffuse interstitial prominence show no significant change, consistent with COPD. No focal areas of pulmonary consolidation or pleural effusion identified. Heart size is within normal limits. No hilar or mediastinal masses identified. Multiple surgical clips again seen in the left upper quadrant.  IMPRESSION: Stable exam.  COPD.  No acute findings.   Electronically Signed   By: Myles Rosenthal M.D.   On: 03/31/2015 17:55    EKG:   Orders placed or performed during the hospital encounter of 03/31/15  . ED EKG  . ED EKG    IMPRESSION AND PLAN:   59 year old Caucasian woman history of COPD with chronic respiratory failure requiring 2 L nasal cannula at baseline presenting with shortness of breath.  1. COPD exacerbation: Start azithromycin given productive sputum without evidence of pneumonia on chest x-ray, Solu-Medrol 60 mg IV daily, supplemental O2 to keep SaO2 greater than 88%, DuoNeb treatments every 4 hours. If breathing worsens may require  BiPAP therapy 2. Venous thrombus embolism prophylactic: SCD    All the records are reviewed and case discussed with ED provider. Management plans discussed with the patient, family and they are in agreement.  CODE STATUS: Full  TOTAL TIME TAKING CARE OF THIS PATIENT: 35 minutes.    Hower,  Mardi Mainland.D on 03/31/2015 at 9:21 PM  Between 7am to 6pm - Pager - [phone]  After 6pm: House Pager: - [phone]  [facility]  Office  [phone]  CC: Primary care physician; Lyndon Code, MD

## 2015-03-31 NOTE — ED Notes (Signed)
Pt presents with shortness of breath and weakness started Sunday and has progressively gotton worse. Pt on home o2. 2 liters.

## 2015-04-01 LAB — EXPECTORATED SPUTUM ASSESSMENT W REFEX TO RESP CULTURE: Special Requests: NORMAL

## 2015-04-01 LAB — MRSA PCR SCREENING: MRSA by PCR: NEGATIVE

## 2015-04-01 MED ORDER — METHYLPREDNISOLONE SODIUM SUCC 125 MG IJ SOLR
60.0000 mg | Freq: Three times a day (TID) | INTRAMUSCULAR | Status: DC
  Administered 2015-04-01 – 2015-04-05 (×13): 60 mg via INTRAVENOUS
  Filled 2015-04-01 (×13): qty 2

## 2015-04-01 MED ORDER — ROPINIROLE HCL 0.25 MG PO TABS
0.2500 mg | ORAL_TABLET | Freq: Every day | ORAL | Status: DC
  Administered 2015-04-01 – 2015-04-04 (×4): 0.25 mg via ORAL
  Filled 2015-04-01 (×5): qty 1

## 2015-04-01 NOTE — Progress Notes (Signed)
Patient ID: Dustin [name], male   DOB: 04-21-1956, 59 y.o.   MRN: [id number] [facility] PROGRESS NOTE  PCP: Lyndon Code, MD  HPI/Subjective: Patient started feeling short of breath and productive cough. Also feeling very weak. Some cramping in the legs. He wears oxygen at home. In the end of May he had MRSA growing out of the sputum and he states he took 21 days of Bactrim.  Objective: Filed Vitals:   04/01/15 0520  BP: 107/56  Pulse: 83  Temp: 97.8 F (36.6 C)  Resp: 18    Intake/Output Summary (Last 24 hours) at 04/01/15 1000 Last data filed at 04/01/15 0900  Gross per 24 hour  Intake      0 ml  Output    725 ml  Net   -725 ml   Filed Weights   03/31/15 1706 03/31/15 2223  Weight: 88.905 kg (196 lb) 90.992 kg (200 lb 9.6 oz)    ROS: Review of Systems  Constitutional: Negative for fever and chills.  Eyes: Negative for blurred vision.  Respiratory: Positive for cough, sputum production and shortness of breath.   Cardiovascular: Negative for chest pain.  Gastrointestinal: Negative for nausea, vomiting, abdominal pain, diarrhea and constipation.  Genitourinary: Negative for dysuria.  Musculoskeletal: Negative for joint pain.  Neurological: Positive for weakness. Negative for dizziness and headaches.   Exam: Physical Exam  Constitutional: He is oriented to person, place, and time.  HENT:  Nose: No mucosal edema.  Mouth/Throat: No oropharyngeal exudate or posterior oropharyngeal edema.  Eyes: Conjunctivae, EOM and lids are normal. Pupils are equal, round, and reactive to light.  Neck: No JVD present. Carotid bruit is not present. No edema present. No thyroid mass and no thyromegaly present.  Cardiovascular: Regular rhythm, S1 normal and S2 normal.  Exam reveals no gallop.   No murmur heard. Pulses:      Dorsalis pedis pulses are 2+ on the right side, and 2+ on the left side.  Respiratory: No respiratory distress. He has decreased breath sounds in  the right upper field, the right middle field, the right lower field, the left upper field, the left middle field and the left lower field. He has no wheezes. He has rhonchi in the right middle field, the right lower field, the left middle field and the left lower field. He has no rales.  GI: Soft. Bowel sounds are normal. There is no tenderness.  Musculoskeletal:       Right ankle: He exhibits no swelling.       Left ankle: He exhibits no swelling.  Lymphadenopathy:    He has no cervical adenopathy.  Neurological: He is alert and oriented to person, place, and time. No cranial nerve deficit.  Skin: Skin is warm. No rash noted. Nails show no clubbing.  Psychiatric: He has a normal mood and affect.    Data Reviewed: Basic Metabolic Panel:  Recent Labs Lab 03/31/15 1714  NA 138  K 3.8  CL 102  CO2 30  GLUCOSE 132*  BUN 7  CREATININE 0.85  CALCIUM 8.8*   CBC:  Recent Labs Lab 03/31/15 1714  WBC 9.0  NEUTROABS 7.4*  HGB 13.1  HCT 39.9*  MCV 96.6  PLT 160     Studies: Dg Chest 2 View  03/31/2015   CLINICAL DATA:  Worsening shortness of breath and weakness for past 2 days. Hypoxia. COPD.  EXAM: CHEST  2 VIEW  COMPARISON:  09/02/2014  FINDINGS: Pulmonary hyperinflation and diffuse interstitial  prominence show no significant change, consistent with COPD. No focal areas of pulmonary consolidation or pleural effusion identified. Heart size is within normal limits. No hilar or mediastinal masses identified. Multiple surgical clips again seen in the left upper quadrant.  IMPRESSION: Stable exam.  COPD.  No acute findings.   Electronically Signed   By: Myles RosenthalJohn  Stahl M.D.   On: 03/31/2015 17:55    Scheduled Meds: . azithromycin  500 mg Intravenous Q24H  . diltiazem  120 mg Oral Daily  . escitalopram  5 mg Oral QHS  . heparin  5,000 Units Subcutaneous 3 times per day  . ipratropium-albuterol  3 mL Nebulization Q6H  . methylPREDNISolone (SOLU-MEDROL) injection  60 mg Intravenous TID   . mometasone-formoterol  2 puff Inhalation BID  . theophylline  200 mg Oral Q12H  . tiotropium  18 mcg Inhalation Daily    Assessment/Plan:  1. Acute on chronic respiratory failure. The patient chronically wears 2 L of oxygen at home. Pulse ox 88% on room air. Patient continues to need oxygen supplementation. 2. COPD exacerbation- I will increase Solu-Medrol to 60 mg every 6 hours. Patient was placed on Zithromax. Continue nebulizers and inhalers. Of note, the patient did have MRSA in the sputum at the end of May and treated with 21 days worth of Bactrim. I will need to get a sputum culture. 3. Pulmonary nodule- recommended CT scan of the chest in 6 months to ensure stability. 4. History of CHF- no signs. DC IV fluids 5. Hypertension essential- continue Cardizem 6. Depression continue low-dose Lexapro. 7. Weakness- physical therapy evaluation.  Code Status:     Code Status Orders        Start     Ordered   03/31/15 2050  Full code   Continuous     03/31/15 2049     Disposition Plan: Home once breathing better  Time spent: 25 minutes  [name], Leshawn Straka  [facility]

## 2015-04-01 NOTE — Care Management (Signed)
Admitted [facility] with the diagnosos of COPD. Lives with wife, Stanton KidneyDebra [phone]). [facility]. [facility] about a year ago. Chronic home oxygen thru [facility] since 2009. Uses no aids for ambulation and takes care of all activities of daily living himself. Sees Dr. Welton FlakesKhan. Last seen in April. Wife or son will transport. Gwenette GreetBrenda S Holland RN MSN Care Management [phone]

## 2015-04-01 NOTE — Plan of Care (Signed)
Problem: Discharge Progression Outcomes Goal: Other Discharge Outcomes/Goals Progress to goal to meet outcomes:  Continues with productive cough. Encouraged deep breathing. Received SVN and tolerated well. Collected sputum specimen. PCR screening done and was negative. Walked with PT, did fairly well except O2 sat decreased to low 70's but went back to 88- 89 quickly after resting. Remains on O2 at 5L. Eating and tolerating diet well. Placed on Contact Precautions for history of MRSA (Sputum).

## 2015-04-01 NOTE — Plan of Care (Signed)
Problem: Discharge Progression Outcomes Goal: Discharge plan in place and appropriate Individualism Outcome: Progressing Pt comes from home with COPD exacerbation. Is on 2L O2 continuous at home.    Goal: Other Discharge Outcomes/Goals Outcome: Progressing Plan of care progress to goal: Pain - pt complains of no pain Diet - pt has a good appetite and tolerating food well Activity - has generalized weakness and on falls precautions Hemodynamically stable - vitals stable this shift Complications - pt having expiratory wheezes, on 3L O2

## 2015-04-01 NOTE — Care Management (Signed)
Physical therapy evaluation completed. Recommends home with home health/physical therapy. Would like [facility], which he has had in the past. Gwenette GreetBrenda S Holland RN MSN Care Management [phone]

## 2015-04-01 NOTE — Evaluation (Signed)
Physical Therapy Evaluation Patient Details Name: Dustin CoriaDennis R Ow MRN: [id number] DOB: Dec 06, 1955 Today's Date: 04/01/2015   History of Present Illness  Arley PhenixDennis Purtle is a 59 y.o. male with a known history of COPD, chronic respiratory failure 2 L has Baseline presenting with shortness of breath. 4-5 day duration of worsening shortness of breath mainly described as dyspnea on exertion with associated cough, productive greenish sputum denies any fevers or chills or chest pain. Positive sick contacts with his grandchildren prior to onset of symptoms. Denies any orthopnea, lower extremity edema  Clinical Impression  Pt is received semirecumbent in bed upon entry, awake, fatigued, and willing to participate. No acute distress noted, pt denying SOB, although found to be between 85-88% SaO2 on 3L. Pt is A&Ox3 and pleasant. Pt reports zero falls in the last 6 months. Pt strength as screened by MMT is 5/5 throughout, although demonstrates some functional weakness AEB 5x STS in 21second which is far below the age matched norm. Pt received on 3LO2 and bumped to 5LO2 for assessment of mobility, but remaining in mid 80's% despite abundant time available for recovery. Pt is on 2LO2 at home per baseline. Patient presenting with impairment of strength, oxygen perfusion, and activity tolerance, limiting ability to perform ADL and mobility tasks at  baseline level of function. Patient will benefit from skilled intervention to address the above impairments and limitations, in order to restore to prior level of function, improve patient safety upon discharge, and to decrease falls risk.       Follow Up Recommendations Home health PT (Has used [facility] in past and is happy c services provided. )    Equipment Recommendations  None recommended by PT;Other (comment) (Pt has a RW at home, but does not need/use. )    Recommendations for Other Services       Precautions / Restrictions Precautions Precautions:  None Restrictions Weight Bearing Restrictions: No      Mobility  Bed Mobility Overal bed mobility: Independent                Transfers Overall transfer level: Needs assistance Equipment used: None Transfers: Sit to/from Stand Sit to Stand: Supervision         General transfer comment: Generalized weakness and desaturating on 5L. Tachycardic.   Ambulation/Gait Ambulation/Gait assistance:  (Additional mobility deferred at this time due to inability to regain SaO2 >91%. )              Stairs            Wheelchair Mobility    Modified Rankin (Stroke Patients Only)       Balance Overall balance assessment: No apparent balance deficits (not formally assessed);Modified Independent                                           Pertinent Vitals/Pain Pain Assessment: No/denies pain    Home Living Family/patient expects to be discharged to:: Private residence Living Arrangements: Spouse/significant other;Children;Other relatives Available Help at Discharge: Family Type of Home: House Home Access: Stairs to enter Entrance Stairs-Rails: Doctor, general practiceight;Left Entrance Stairs-Number of Steps: 2 Home Layout: One level        Prior Function Level of Independence: Independent with assistive device(s);Needs assistance   Gait / Transfers Assistance Needed: Able to tolerate limited community distance ambulation c 2L O2 using shopping cart as AD.  ADL's / [employer] Assistance  Needed: Mod in home on 2LO2 s AD        Hand Dominance        Extremity/Trunk Assessment   Upper Extremity Assessment: Overall WFL for tasks assessed           Lower Extremity Assessment: Generalized weakness (5/5 MMT screening; 5xSTS: 21 seconds)         Communication   Communication: No difficulties  Cognition Arousal/Alertness:  (Malaise) Behavior During Therapy: WFL for tasks assessed/performed Overall Cognitive Status: Within Functional Limits for tasks  assessed                      General Comments      Exercises        Assessment/Plan    PT Assessment Patient needs continued PT services  PT Diagnosis Generalized weakness   PT Problem List Decreased activity tolerance;Decreased strength;Decreased balance;Cardiopulmonary status limiting activity;Decreased mobility  PT Treatment Interventions Gait training;Functional mobility training;Therapeutic activities;Therapeutic exercise   PT Goals (Current goals can be found in the Care Plan section) Acute Rehab PT Goals Patient Stated Goal: Return to home, regain strength.  PT Goal Formulation: With patient Time For Goal Achievement: 04/15/15 Potential to Achieve Goals: Fair    Frequency Min 2X/week   Barriers to discharge        Co-evaluation               End of Session   Activity Tolerance: Patient limited by fatigue (Additional SOB. Tachycardia. ) Patient left: in bed;with call bell/phone within reach;with bed alarm set Nurse Communication: Other (comment)         Time: 6962-9528 PT Time Calculation (min) (ACUTE ONLY): 15 min   Charges:   PT Evaluation $Initial PT Evaluation Tier I: 1 Procedure     PT G Codes:        Evyn Putzier C 04-14-2015, 2:37 PM  2:44 PM  Rosamaria Lints, PT, DPT [place] License # [id number]

## 2015-04-01 NOTE — Progress Notes (Signed)
Initial Nutrition Assessment  INTERVENTION:   Meals and Snacks: Cater to patient preferences Medical Food Supplement Therapy: will recommend on follow if po intake poor  NUTRITION DIAGNOSIS:   No nutrition diagnosis at this time.  GOAL:   Patient will meet greater than or equal to 90% of their needs  MONITOR:    (Energy Intake, Pulmonary Profile, Glucose Profile)  REASON FOR ASSESSMENT:   Malnutrition Screening Tool    ASSESSMENT:   Pt admitted with COPD exacerbation and weakness.  Past Medical History  Diagnosis Date  . Asthma   . COPD (chronic obstructive pulmonary disease)   . Allergy   . Anxiety   . CHF (congestive heart failure)   . Depression   . Emphysema of lung   . GERD (gastroesophageal reflux disease)     Diet Order:  Diet Heart Room service appropriate?: Yes; Fluid consistency:: Thin   Current Nutrition: Pt just had breakfast delivered on visit this am.   Food/Nutrition-Related History: Pt reports good appetite PTA eating 3 meals per day PTA.   Medications: Solumderol  Electrolyte/Renal Profile and Glucose Profile:   Recent Labs Lab 03/31/15 1714  NA 138  K 3.8  CL 102  CO2 30  BUN 7  CREATININE 0.85  CALCIUM 8.8*  GLUCOSE 132*   Protein Profile: No results for input(s): ALBUMIN in the last 168 hours.  Gastrointestinal Profile: Last BM:  03/31/2015   Weight Change: Pt reports weight loss of 15lbs in the past 3-4 months (7% weight loss in 3-4 months) Anthropometrics:   Skin:  Reviewed, no issues   Height:   Ht Readings from Last 1 Encounters:  03/31/15 5\' 10"  (1.778 m)    Weight:   Wt Readings from Last 1 Encounters:  03/31/15 200 lb 9.6 oz (90.992 kg)    Wt Readings from Last 10 Encounters:  03/31/15 200 lb 9.6 oz (90.992 kg)    BMI:  Body mass index is 28.78 kg/(m^2).   EDUCATION NEEDS:   No education needs identified at this time  LOW Care Level  Leda QuailAllyson Ellianne Gowen, IowaRD, LDN Pager [phone]

## 2015-04-01 NOTE — Plan of Care (Signed)
Problem: Acute Rehab PT Goals(only PT should resolve) Goal: Pt Will Ambulate Pt will ambulate with LRAD c ModI using a step-through pattern and equal step length for a distances greater than 25200ft on 2L c SaO2>91% to demonstrate the ability to perform safe household distance ambulation at discharge.    Goal: Pt Will Go Up/Down Stairs Pt will ascend/descend 2 stairs with LRAD, 1 HR, and 2L O2 at ModI c SaO2>91% to demonstrate safe entry/exit of home.

## 2015-04-02 ENCOUNTER — Inpatient Hospital Stay: Payer: Medicare Other

## 2015-04-02 MED ORDER — VANCOMYCIN HCL 10 G IV SOLR
1250.0000 mg | Freq: Two times a day (BID) | INTRAVENOUS | Status: DC
  Administered 2015-04-03 – 2015-04-04 (×3): 1250 mg via INTRAVENOUS
  Filled 2015-04-02 (×7): qty 1250

## 2015-04-02 MED ORDER — AZITHROMYCIN 250 MG PO TABS
500.0000 mg | ORAL_TABLET | ORAL | Status: DC
  Administered 2015-04-02 – 2015-04-04 (×3): 500 mg via ORAL
  Filled 2015-04-02 (×3): qty 2

## 2015-04-02 MED ORDER — VANCOMYCIN HCL 10 G IV SOLR
1250.0000 mg | Freq: Once | INTRAVENOUS | Status: AC
  Administered 2015-04-02: 14:00:00 1250 mg via INTRAVENOUS
  Filled 2015-04-02: qty 1250

## 2015-04-02 NOTE — Care Management Note (Signed)
Case Management Note  Patient Details  Name: Dustin CoriaDennis R [name] MRN: [id number] Date of Birth: Feb 19, 1956  Subjective/Objective:  Referral to [facility](Kari Danforth, (Cell [phone])  for home health PT. May need to add additional services closer to discharge.                 Action/Plan:   Expected Discharge Date:                  Expected Discharge Plan:     In-House Referral:     Discharge planning Services     Post Acute Care Choice:    Choice offered to:     DME Arranged:    DME Agency:     HH Arranged:   PT [facility]   [facility]  Status of Service:     Medicare Important Message Given:    Date Medicare IM Given:    Medicare IM give by:    Date Additional Medicare IM Given:    Additional Medicare Important Message give by:     If discussed at Long Length of Stay Meetings, dates discussed:    Additional Comments:  Marily MemosLisa M Berish Bohman, RN 04/02/2015, 9:15 AM

## 2015-04-02 NOTE — Progress Notes (Signed)
[facility] - [place] at [facility]   PATIENT NAME: Dustin [name]    MR#:  [id number]  DATE OF BIRTH:  December 08, 1955  SUBJECTIVE:  CHIEF COMPLAINT:   Chief Complaint  Patient presents with  . Shortness of Breath   No significant improvement in shortness of breath. Still requiring 5 L by nasal cannula. Comfortable at rest.  REVIEW OF SYSTEMS:   Review of Systems  Constitutional: Negative for fever.  Respiratory: Positive for cough, sputum production, shortness of breath and wheezing.   Cardiovascular: Negative for chest pain and palpitations.  Gastrointestinal: Negative for nausea, vomiting and abdominal pain.  Genitourinary: Negative for dysuria.    DRUG ALLERGIES:   Allergies  Allergen Reactions  . Asa [Aspirin] Other (See Comments)    Reaction:  Unknown   . Codeine Other (See Comments)    Reaction:  Unknown   . Contrast Media [Iodinated Diagnostic Agents] Other (See Comments)    Reaction:  Unknown   . Ibuprofen Other (See Comments)    Reaction:  Unknown   . Solu-Medrol [Methylprednisolone Acetate] Other (See Comments)    Reaction:  Unknown     VITALS:  Blood pressure 125/70, pulse 113, temperature 98 F (36.7 C), temperature source Oral, resp. rate 18, height  (1.778 m), weight 90.992 kg (200 lb 9.6 oz), SpO2 85 %.  PHYSICAL EXAMINATION:  GENERAL:  59 y.o.-year-old patient lying in the bed, nasal cannula, slightly short of breath at rest EYES: Pupils equal, round, reactive to light and accommodation. No scleral icterus. Extraocular muscles intact.  HEENT: Head atraumatic, normocephalic. Oropharynx and nasopharynx clear. His membranes are moist NECK:  Supple, no jugular venous distention. No thyroid enlargement, no tenderness.  LUNGS: Wheezes throughout, fair air movement, no crackles rhonchi or rales CARDIOVASCULAR: S1, S2 normal. No murmurs, rubs, or gallops. Tachycardic ABDOMEN: Soft, nontender, nondistended. Bowel sounds present.  No organomegaly or mass.  EXTREMITIES: No pedal edema, cyanosis, or clubbing.  NEUROLOGIC: Cranial nerves II through XII are intact. Muscle strength 5/5 in all extremities. Sensation intact. Gait not checked.  PSYCHIATRIC: The patient is alert and oriented x 3.  SKIN: No obvious rash, lesion, or ulcer.    LABORATORY PANEL:   CBC  Recent Labs Lab 03/31/15 1714  WBC 9.0  HGB 13.1  HCT 39.9*  PLT 160   ------------------------------------------------------------------------------------------------------------------  Chemistries   Recent Labs Lab 03/31/15 1714  NA 138  K 3.8  CL 102  CO2 30  GLUCOSE 132*  BUN 7  CREATININE 0.85  CALCIUM 8.8*   ------------------------------------------------------------------------------------------------------------------  Cardiac Enzymes No results for input(s): TROPONINI in the last 168 hours. ------------------------------------------------------------------------------------------------------------------  RADIOLOGY:  Dg Chest 2 View  03/31/2015   CLINICAL DATA:  Worsening shortness of breath and weakness for past 2 days. Hypoxia. COPD.  EXAM: CHEST  2 VIEW  COMPARISON:  09/02/2014  FINDINGS: Pulmonary hyperinflation and diffuse interstitial prominence show no significant change, consistent with COPD. No focal areas of pulmonary consolidation or pleural effusion identified. Heart size is within normal limits. No hilar or mediastinal masses identified. Multiple surgical clips again seen in the left upper quadrant.  IMPRESSION: Stable exam.  COPD.  No acute findings.   Electronically Signed   By: Myles Rosenthal M.D.   On: 03/31/2015 17:55    EKG:   Orders placed or performed during the hospital encounter of 03/31/15  . ED EKG  . ED EKG  . EKG 12-Lead  . EKG 12-Lead    ASSESSMENT AND PLAN:  1. Acute on chronic respiratory failure due to COPD exacerbation. The patient chronically wears 2 L of oxygen at home. Pulse ox 88% on room  air. Patient continues to need oxygen supplementation at 5 L via nasal cannula.  2. COPD exacerbation- continue with increased dose of Solu-Medrol 60 mg every 6 hours. Continue every 6 hour breathing treatments. Patient was placed on Zithromax, I will add vancomycin due to history of MRSA. Chest x-ray on admission with no overt pneumonia. Sputum cultures are pending.  Of note, the patient did have MRSA in the sputum at the end of May and treated with 21 days worth of Bactrim. Repeat chest x-ray today  3. Pulmonary nodule- recommended CT scan of the chest in 6 months to ensure stability. 4. History of CHF- no signs. DC IV fluids 5. Hypertension essential- continue Cardizem 6. Depression continue low-dose Lexapro. 7. Weakness- physical therapy evaluation.  All the records are reviewed and case discussed with Care Management/Social Workerr. Management plans discussed with the patient, family and they are in agreement.  CODE STATUS: Full  TOTAL TIME TAKING CARE OF THIS PATIENT: 35 minutes.  Greater than 50% of time spent in care coordination and counseling. POSSIBLE D/C IN 3 DAYS, DEPENDING ON CLINICAL CONDITION.   Elby ShowersWALSH, Choice Kleinsasser M.D on 04/02/2015 at 12:23 PM  Between 7am to 6pm - Pager - [phone]  After 6pm go to [email or web address] - password [email or web address] [facility]  Office  [phone]  CC: Primary care physician; Lyndon CodeKHAN, FOZIA M, MD

## 2015-04-02 NOTE — Progress Notes (Signed)
Physical Therapy Treatment Patient Details Name: Dustin CoriaDennis R [name] MRN: [id number] DOB: [birth year] Today's Date: 04/02/2015    History of Present Illness Dustin PhenixDennis [name] is a 59 y.o. male with a known history of COPD, chronic respiratory failure 2 L has Baseline presenting with shortness of breath. 4-5 day duration of worsening shortness of breath mainly described as dyspnea on exertion with associated cough, productive greenish sputum denies any fevers or chills or chest pain. Positive sick contacts with his grandchildren prior to onset of symptoms. Denies any orthopnea, lower extremity edema    PT Comments    Pt tolerating treatment session well, motivated and able to complete entire PT sesssion as planned. Pt continues to make progress toward goals as evidenced byimproved activity tolerance, but continues to desat c mobility. Today he demonstrates great ability to recover O2Sats within 60 seconds rest, whereas yesterday he was not recovering. Pt's greatest limitation continues to be limited activity tolerance which continues to limit ability to perform ambulation at baseline function. Patient presenting with impairment of strength, balance, and activity tolerance, limiting ability to perform ADL and mobility tasks at  baseline level of function. Patient will benefit from skilled intervention to address the above impairments and limitations, in order to restore to prior level of function, improve patient safety upon discharge, and to decrease caregiver burden.    Follow Up Recommendations  Home health PT     Equipment Recommendations  None recommended by PT;Other (comment)    Recommendations for Other Services       Precautions / Restrictions Precautions Precautions: None Restrictions Weight Bearing Restrictions: No    Mobility  Bed Mobility Overal bed mobility: Independent                Transfers Overall transfer level: Modified independent Equipment used:  None Transfers: Sit to/from Stand Sit to Stand: Modified independent (Device/Increase time)         General transfer comment: 5x STS: 15 seconds, improved from 21s yesterday. SaO2 p test at 89%, remaining stablem, c recovery to 92% p 60seconds rest.   Ambulation/Gait Ambulation/Gait assistance: Supervision Ambulation Distance (Feet): 70 Feet Assistive device: None Gait Pattern/deviations: WFL(Within Functional Limits)   Gait velocity interpretation: Below normal speed for age/gender General Gait Details: Slow on 5L O2, pt reports to feel a little wobbly.  Desats to 85%.    Stairs            Wheelchair Mobility    Modified Rankin (Stroke Patients Only)       Balance Overall balance assessment: No apparent balance deficits (not formally assessed);Modified Independent                                  Cognition Arousal/Alertness: Awake/alert Behavior During Therapy: WFL for tasks assessed/performed Overall Cognitive Status: Within Functional Limits for tasks assessed                      Exercises      General Comments        Pertinent Vitals/Pain Pain Assessment: No/denies pain    Home Living                      Prior Function            PT Goals (current goals can now be found in the care plan section) Acute Rehab PT Goals Patient Stated Goal: Return to home,  regain strength.  PT Goal Formulation: With patient Time For Goal Achievement: 04/15/15 Potential to Achieve Goals: Fair Progress towards PT goals: Progressing toward goals    Frequency  Min 2X/week    PT Plan Current plan remains appropriate    Co-evaluation             End of Session   Activity Tolerance: Patient limited by fatigue Patient left: in chair;with chair alarm set;with call bell/phone within reach     Time: 1610-9604 PT Time Calculation (min) (ACUTE ONLY): 14 min  Charges:  $Therapeutic Activity: 8-22 mins                    G  Codes:      Shikha Bibb C 04/03/15, 11:41 AM 11:43 AM  Rosamaria Lints, PT, DPT [place] License # [id number]

## 2015-04-02 NOTE — Progress Notes (Signed)
IV to PO conversion  Indication: COPD exacerbation w/ CAP  Per nurse- Pt appetite is good. WBC is <10 and ANC in >1000. Pt has had no fever in last 24hr and is taking other medications po. Per protocol will switch patient from IV azithromyocin to po. Azithro 500mg  po q 24 hr  Dayne Dekay D Leilana Mcquire, Pharm.D Clinical Pharmacist 04/02/2015

## 2015-04-02 NOTE — Plan of Care (Signed)
Problem: Discharge Progression Outcomes Goal: Discharge plan in place and appropriate Individualism Outcome: Progressing Pt comes from home with COPD exacerbation. Is on 2L O2 continuous at home. Goal: Other Discharge Outcomes/Goals Outcome: Progressing Plan of care progress to goal: Pain - pt complains of no pain Hemodynamically stable - hypotensive and tachcardic Complications - pt worked with PT, continues to receive breathing treatments and antibiotics Diet - good appetite Activity - working with pt

## 2015-04-02 NOTE — Care Management Important Message (Signed)
Important Message  Patient Details  Name: Dustin [name] MRN: [id number] Date of Birth: [birth year]   Medicare Important Message Given:  Yes-second notification given    Marily MemosLisa M Izreal Kock, RN 04/02/2015, 11:05 AM

## 2015-04-02 NOTE — Plan of Care (Addendum)
Problem: Discharge Progression Outcomes Goal: Other Discharge Outcomes/Goals Outcome: Progressing Denies pain. Remains on IV antibiotics. Productive cough noted.  Remains on 5L O2 via [place]. Encouraged use of Incentive Spirometer.  Scheduled Inhalers.  Scheduled Duo-Neb Treatments.  MRSA Negative. Ambulated to Recliner with Physical Therapy Assistance.  Healthy Heart Diet. Tolerating Well.

## 2015-04-02 NOTE — Progress Notes (Signed)
ANTIBIOTIC CONSULT NOTE - INITIAL  Pharmacy Consult for Vancomycin Indication: pneumonia  Allergies  Allergen Reactions  . Asa [Aspirin] Other (See Comments)    Reaction:  Unknown   . Codeine Other (See Comments)    Reaction:  Unknown   . Contrast Media [Iodinated Diagnostic Agents] Other (See Comments)    Reaction:  Unknown   . Ibuprofen Other (See Comments)    Reaction:  Unknown   . Solu-Medrol [Methylprednisolone Acetate] Other (See Comments)    Reaction:  Unknown     Patient Measurements: Height:  (177.8 cm) Weight: 200 lb 9.6 oz (90.992 kg) IBW/kg (Calculated) : 73 Adjusted Body Weight:   Vital Signs: Temp: 97.8 F (36.6 C) (07/15 1241) Temp Source: Oral (07/15 1241) BP: 119/64 mmHg (07/15 1241) Pulse Rate: 97 (07/15 1241) Intake/Output from previous day: 07/14 0701 - 07/15 0700 In: -  Out: 1350 [Urine:1350] Intake/Output from this shift: Total I/O In: -  Out: 200 [Urine:200]  Labs:  Recent Labs  03/31/15 1714  WBC 9.0  HGB 13.1  PLT 160  CREATININE 0.85   Estimated Creatinine Clearance: 103.5 mL/min (by C-G formula based on Cr of 0.85). No results for input(s): VANCOTROUGH, VANCOPEAK, VANCORANDOM, GENTTROUGH, GENTPEAK, GENTRANDOM, TOBRATROUGH, TOBRAPEAK, TOBRARND, AMIKACINPEAK, AMIKACINTROU, AMIKACIN in the last 72 hours.   Microbiology: Recent Results (from the past 720 hour(s))  MRSA PCR Screening     Status: None   Collection Time: 04/01/15  1:20 PM  Result Value Ref Range Status   MRSA by PCR NEGATIVE NEGATIVE Final    Comment:        The GeneXpert MRSA Assay (FDA approved for NASAL specimens only), is one component of a comprehensive MRSA colonization surveillance program. It is not intended to diagnose MRSA infection nor to guide or monitor treatment for MRSA infections.   Culture, expectorated sputum-assessment     Status: None   Collection Time: 04/01/15  2:45 PM  Result Value Ref Range Status   Specimen Description SPUTUM   Final   Special Requests Normal  Final   Sputum evaluation THIS SPECIMEN IS ACCEPTABLE FOR SPUTUM CULTURE  Final   Report Status 04/01/2015 FINAL  Final  Culture, respiratory (NON-Expectorated)     Status: None (Preliminary result)   Collection Time: 04/01/15  2:45 PM  Result Value Ref Range Status   Specimen Description SPUTUM  Final   Special Requests Normal Reflexed from [id number]  Final   Gram Stain PENDING  Incomplete   Culture HOLDING FOR POSSIBLE PATHOGEN  Final   Report Status PENDING  Incomplete    Medical History: Past Medical History  Diagnosis Date  . Asthma   . COPD (chronic obstructive pulmonary disease)   . Allergy   . Anxiety   . CHF (congestive heart failure)   . Depression   . Emphysema of lung   . GERD (gastroesophageal reflux disease)     Medications:  Scheduled:  . azithromycin  500 mg Oral Q24H  . diltiazem  120 mg Oral Daily  . escitalopram  5 mg Oral QHS  . heparin  5,000 Units Subcutaneous 3 times per day  . ipratropium-albuterol  3 mL Nebulization Q6H  . methylPREDNISolone (SOLU-MEDROL) injection  60 mg Intravenous TID  . mometasone-formoterol  2 puff Inhalation BID  . rOPINIRole  0.25 mg Oral QHS  . theophylline  200 mg Oral Q12H  . tiotropium  18 mcg Inhalation Daily   Assessment: Ke=0.082 Half life =8.45hr vd = 63.7L   Goal of Therapy:  Vancomycin trough level 15-20 mcg/ml  Plan:  Vancomycin 1250mg  now. In 6 hours start Vancomycin 1250 q 12 hours. Check trough prior to 5th total dose. 7/17 @ 0730 Measure antibiotic drug levels at steady state Follow up culture results  Olene FlossMelissa D Thoren Hosang 04/02/2015,1:24 PM

## 2015-04-03 LAB — BASIC METABOLIC PANEL
ANION GAP: 5 (ref 5–15)
BUN: 18 mg/dL (ref 6–20)
CALCIUM: 8.6 mg/dL — AB (ref 8.9–10.3)
CO2: 33 mmol/L — AB (ref 22–32)
CREATININE: 0.8 mg/dL (ref 0.61–1.24)
Chloride: 104 mmol/L (ref 101–111)
GFR calc Af Amer: >60 mL/min (ref >60)
Glucose, Bld: 135 mg/dL — ABNORMAL HIGH (ref 65–99)
Potassium: 4.5 mmol/L (ref 3.5–5.1)
SODIUM: 142 mmol/L (ref 135–145)

## 2015-04-03 LAB — CBC
HCT: 36.6 % — ABNORMAL LOW (ref 40.0–52.0)
HEMOGLOBIN: 11.9 g/dL — AB (ref 13.0–18.0)
MCH: 31.3 pg (ref 26.0–34.0)
MCHC: 32.5 g/dL (ref 32.0–36.0)
MCV: 96.4 fL (ref 80.0–100.0)
Platelets: 198 10*3/uL (ref 150–440)
RBC: 3.8 MIL/uL — AB (ref 4.40–5.90)
RDW: 14.4 % (ref 11.5–14.5)
WBC: 10.2 10*3/uL (ref 3.8–10.6)

## 2015-04-03 NOTE — Plan of Care (Signed)
Problem: Discharge Progression Outcomes Goal: Other Discharge Outcomes/Goals Plan of care progress to goal: Hemodynamically: VSS, O2 continues at 5L Diet: tolerated wel Activity: up to assistance

## 2015-04-03 NOTE — Progress Notes (Signed)
[facility] [place] [facility]   PATIENT NAME: Dustin [name]    MR#:  [id number]  DATE OF BIRTH:  02-Jul-1956  SUBJECTIVE:  CHIEF COMPLAINT:   Chief Complaint  Patient presents with  . Shortness of Breath   Pt. Here due to shortness of breath from COPD Exacerbation.  Feels a bit better. Remains on 5 L [place].    REVIEW OF SYSTEMS:    Review of Systems  Constitutional: Negative for fever and chills.  HENT: Negative for congestion and tinnitus.   Eyes: Negative for blurred vision and double vision.  Respiratory: Positive for cough, sputum production and shortness of breath (improved). Negative for wheezing.   Cardiovascular: Negative for chest pain, orthopnea and PND.  Gastrointestinal: Negative for nausea, vomiting, abdominal pain and diarrhea.  Genitourinary: Negative for dysuria and hematuria.  Neurological: Negative for dizziness, sensory change and focal weakness.  All other systems reviewed and are negative.   Nutrition: Heart Healthy Tolerating Diet: Yes   DRUG ALLERGIES:   Allergies  Allergen Reactions  . Asa [Aspirin] Other (See Comments)    Reaction:  Unknown   . Codeine Other (See Comments)    Reaction:  Unknown   . Contrast Media [Iodinated Diagnostic Agents] Other (See Comments)    Reaction:  Unknown   . Ibuprofen Other (See Comments)    Reaction:  Unknown   . Solu-Medrol [Methylprednisolone Acetate] Other (See Comments)    Reaction:  Unknown     VITALS:  Blood pressure 123/65, pulse 92, temperature 98.1 F (36.7 C), temperature source Oral, resp. rate 22, height  (1.778 m), weight 90.992 kg (200 lb 9.6 oz), SpO2 94 %.  PHYSICAL EXAMINATION:   Physical Exam  GENERAL:  59 y.o.-year-old patient lying in the bed in NAD.  EYES: Pupils equal, round, reactive to light and accommodation. No scleral icterus. Extraocular muscles intact.  HEENT: Head atraumatic, normocephalic. Oropharynx and nasopharynx clear.  NECK:   Supple, no jugular venous distention. No thyroid enlargement, no tenderness.  LUNGS: Prolonged inspiratory & Exp. Phase.  Minimal end-exp. wheezing. No use of accessory muscles of respiration.  CARDIOVASCULAR: S1, S2 normal. No murmurs, rubs, or gallops.  ABDOMEN: Soft, nontender, nondistended. Bowel sounds present. No organomegaly or mass.  EXTREMITIES: No cyanosis, clubbing or edema b/l.    NEUROLOGIC: Cranial nerves II through XII are intact. No focal Motor or sensory deficits b/l.   PSYCHIATRIC: The patient is alert and oriented x 3. Good Affect.  SKIN: No obvious rash, lesion, or ulcer.    LABORATORY PANEL:   CBC  Recent Labs Lab 04/03/15 0432  WBC 10.2  HGB 11.9*  HCT 36.6*  PLT 198   ------------------------------------------------------------------------------------------------------------------  Chemistries   Recent Labs Lab 04/03/15 0432  NA 142  K 4.5  CL 104  CO2 33*  GLUCOSE 135*  BUN 18  CREATININE 0.80  CALCIUM 8.6*   ------------------------------------------------------------------------------------------------------------------  Cardiac Enzymes No results for input(s): TROPONINI in the last 168 hours. ------------------------------------------------------------------------------------------------------------------  RADIOLOGY:  Dg Chest 2 View  04/02/2015   CLINICAL DATA:  Shortness of breath.  EXAM: CHEST  2 VIEW  COMPARISON:  March 31, 2015.  FINDINGS: No pneumothorax is noted. Stable cardiomediastinal silhouette. Increased bibasilar opacities are noted concerning for probable bilateral pleural effusions with associated atelectasis or infiltrate. Bony thorax is intact.  IMPRESSION: Mild bibasilar opacities are noted concerning for probable pleural effusions with associated atelectasis or infiltrate. Follow-up radiographs are recommended.   Electronically Signed   By: Fayrene Fearing  Christen ButterGreen Jr, M.D.   On: 04/02/2015 13:54     ASSESSMENT AND PLAN:    59 year old male with past medical history of COPD, anxiety, CHF, depression, GERD who presented to the hospital due to shortness of breath and noted to be in acute on chronic respiratory failure due to COPD exacerbation.  #1 Acute on chronic respiratory failure due to COPD exacerbation-continue IV steroids, DuoNeb nebs around-the-clock,Spiriva, theophylline -Patient is already on oxygen at home at 2 L. Presently he is at 5 L and will continue to wean as tolerated.  #2 COPD exacerbation- continue Solumedrol, duonebs ATC, Theophylline, Spiriva.  -  Slow to improve.  - already on O2 at home.  Sputum Cx so far (-).  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 IV Vanco, Zithromax for now.   Possible d/c home tomorrow on long steroid taper. ' - follows w/ Dr. Freda MunroSaadat Khan as outpatient.   #3 Pulmonary nodule- recommended CT scan of the chest in 6 months to ensure stability.  #4 History of CHF- clinically not in CHF.   #5 Hypertension essential - continue Cardizem  #6 Depression-continue low-dose Lexapro.  #7 generalized Weakness- seen by physical therapy and recommended home health services.    All the records are reviewed and case discussed with Care Management/Social Workerr. Management plans discussed with the patient, family and they are in agreement.  CODE STATUS: Full  DVT Prophylaxis: Heparin subcutaneous  TOTAL TIME TAKING CARE OF THIS PATIENT: 30 minutes.   POSSIBLE D/C IN 1-2 DAYS, DEPENDING ON CLINICAL CONDITION.   [name],Yamil Oelke J M.D on 04/03/2015 at 11:10 AM  Between 7am to 6pm - Pager - [phone]  After 6pm go to [email or web address] - password [email or web address] [facility]  [facility]  Office  [phone]  CC: Primary care physician; Lyndon CodeKHAN, FOZIA M, MD

## 2015-04-03 NOTE — Plan of Care (Signed)
Problem: Discharge Progression Outcomes Goal: Other Discharge Outcomes/Goals Outcome: Progressing 1. Pain:         Denies pain. 2. Hemodynamically Stable:         Vital Signs remain stable.         Titrated to 4L O2 via [place]. O2 sat 94%. Bilateral lungs diminished.         Remains on Scheduled Inhalers.           Remains on Scheduled Duo-Neb Treatments.           Encouraged use of Incentive Spirometer. Demonstrated Teach back.         Labs remaining stable. Remains on IV Antibiotics. 3. Complications:         No s/s of complications noted. 4. Diet:         Healthy Heart Diet. Tolerating well. 5. Activity:         OOB to Recliner with one person assist. Tolerated well.

## 2015-04-04 LAB — VANCOMYCIN, TROUGH: VANCOMYCIN TR: 20 ug/mL (ref 10–20)

## 2015-04-04 LAB — GLUCOSE, CAPILLARY: GLUCOSE-CAPILLARY: 108 mg/dL — AB (ref 65–99)

## 2015-04-04 MED ORDER — IPRATROPIUM-ALBUTEROL 0.5-2.5 (3) MG/3ML IN SOLN
3.0000 mL | Freq: Three times a day (TID) | RESPIRATORY_TRACT | Status: DC
  Administered 2015-04-04 – 2015-04-05 (×5): 3 mL via RESPIRATORY_TRACT
  Filled 2015-04-04 (×5): qty 3

## 2015-04-04 MED ORDER — SODIUM CHLORIDE 0.9 % IJ SOLN
3.0000 mL | INTRAMUSCULAR | Status: DC | PRN
  Administered 2015-04-04 (×3): 3 mL via INTRAVENOUS
  Filled 2015-04-04 (×3): qty 10

## 2015-04-04 MED ORDER — VANCOMYCIN HCL IN DEXTROSE 1-5 GM/200ML-% IV SOLN
1000.0000 mg | Freq: Two times a day (BID) | INTRAVENOUS | Status: DC
  Administered 2015-04-04: 1000 mg via INTRAVENOUS
  Filled 2015-04-04 (×4): qty 200

## 2015-04-04 MED ORDER — ALBUTEROL SULFATE (2.5 MG/3ML) 0.083% IN NEBU: 2.5000 mg | INHALATION_SOLUTION | Freq: Four times a day (QID) | RESPIRATORY_TRACT | Status: DC | PRN

## 2015-04-04 NOTE — Progress Notes (Signed)
ANTIBIOTIC CONSULT NOTE - FOLLOW UP   Pharmacy Consult for Vancomycin Indication: pneumonia  Allergies  Allergen Reactions  . Asa [Aspirin] Other (See Comments)    Reaction:  Unknown   . Codeine Other (See Comments)    Reaction:  Unknown   . Contrast Media [Iodinated Diagnostic Agents] Other (See Comments)    Reaction:  Unknown   . Ibuprofen Other (See Comments)    Reaction:  Unknown   . Solu-Medrol [Methylprednisolone Acetate] Other (See Comments)    Reaction:  Unknown     Patient Measurements: Height:  (177.8 cm) Weight: 200 lb 9.6 oz (90.992 kg) IBW/kg (Calculated) : 73 Adjusted Body Weight:   Vital Signs: Temp: 97.5 F (36.4 C) (07/17 0451) Temp Source: Oral (07/17 0451) BP: 104/61 mmHg (07/17 0451) Pulse Rate: 70 (07/17 0451) Intake/Output from previous day: 07/16 0701 - 07/17 0700 In: 1223 [P.O.:720; IV Piggyback:503] Out: 2800 [Urine:2800] Intake/Output from this shift: Total I/O In: -  Out: 225 [Urine:225]  Labs:  Recent Labs  04/03/15 0432  WBC 10.2  HGB 11.9*  PLT 198  CREATININE 0.80   Estimated Creatinine Clearance: 110 mL/min (by C-G formula based on Cr of 0.8).  Recent Labs  04/04/15 0729  [facility] 20     Microbiology: Recent Results (from the past 720 hour(s))  MRSA PCR Screening     Status: None   Collection Time: 04/01/15  1:20 PM  Result Value Ref Range Status   MRSA by PCR NEGATIVE NEGATIVE Final    Comment:        The GeneXpert MRSA Assay (FDA approved for NASAL specimens only), is one component of a comprehensive MRSA colonization surveillance program. It is not intended to diagnose MRSA infection nor to guide or monitor treatment for MRSA infections.   Culture, expectorated sputum-assessment     Status: None   Collection Time: 04/01/15  2:45 PM  Result Value Ref Range Status   Specimen Description SPUTUM  Final   Special Requests Normal  Final   Sputum evaluation THIS SPECIMEN IS ACCEPTABLE FOR SPUTUM  CULTURE  Final   Report Status 04/01/2015 FINAL  Final  Culture, respiratory (NON-Expectorated)     Status: None (Preliminary result)   Collection Time: 04/01/15  2:45 PM  Result Value Ref Range Status   Specimen Description SPUTUM  Final   Special Requests Normal Reflexed from [id number]  Final   Gram Stain   Final    MANY WBC SEEN RARE GRAM NEGATIVE RODS RARE GRAM POSITIVE COCCI    Culture   Final    RARE GROWTH UNIDENTIFIED ORGANISM IDENTIFICATION AND SUSCEPTIBILITIES TO FOLLOW HOLDING FOR ADDITIONAL POSSIBLE PATHOGEN    Report Status PENDING  Incomplete    Medical History: Past Medical History  Diagnosis Date  . Asthma   . COPD (chronic obstructive pulmonary disease)   . Allergy   . Anxiety   . CHF (congestive heart failure)   . Depression   . Emphysema of lung   . GERD (gastroesophageal reflux disease)     Medications:  Scheduled:  . azithromycin  500 mg Oral Q24H  . diltiazem  120 mg Oral Daily  . escitalopram  5 mg Oral QHS  . heparin  5,000 Units Subcutaneous 3 times per day  . ipratropium-albuterol  3 mL Nebulization TID  . methylPREDNISolone (SOLU-MEDROL) injection  60 mg Intravenous TID  . mometasone-formoterol  2 puff Inhalation BID  . rOPINIRole  0.25 mg Oral QHS  . theophylline  200 mg Oral  Q12H  . tiotropium  18 mcg Inhalation Daily  . vancomycin  1,000 mg Intravenous Q12H   Assessment: Ke=0.082 Half life =8.45hr vd = 63.7L  Vancomycin level on 7/17 resulted @ 20 mcg/ml.   Goal of Therapy:  Vancomycin trough level 15-20 mcg/ml  Plan:  Although Vancomycin level is within goal range, will reduce dose slightly to Vancomycin 1 g IV q12 hours to prevent level from becoming out of range.   Measure antibiotic drug levels at steady state Follow up culture results  Korayma Hagwood D 04/04/2015,8:07 AM

## 2015-04-04 NOTE — Progress Notes (Signed)
[facility] - [place] at [facility]   PATIENT NAME: Dustin PhenixDennis [name]    MR#:  [id number]  DATE OF BIRTH:  12/26/1955  SUBJECTIVE:  CHIEF COMPLAINT:   Chief Complaint  Patient presents with  . Shortness of Breath   Pt. Here due to shortness of breath from COPD Exacerbation.  Still does not feel like he is back to his baseline.  + cough w/ yellow sputum.  O2 requirements down to 3L now.      REVIEW OF SYSTEMS:    Review of Systems  Constitutional: Negative for fever and chills.  HENT: Negative for congestion and tinnitus.   Eyes: Negative for blurred vision and double vision.  Respiratory: Positive for cough, sputum production and shortness of breath (improved). Negative for wheezing.   Cardiovascular: Negative for chest pain, orthopnea and PND.  Gastrointestinal: Negative for nausea, vomiting, abdominal pain and diarrhea.  Genitourinary: Negative for dysuria and hematuria.  Neurological: Negative for dizziness, sensory change and focal weakness.  All other systems reviewed and are negative.   Nutrition: Heart Healthy Tolerating Diet: Yes   DRUG ALLERGIES:   Allergies  Allergen Reactions  . Asa [Aspirin] Other (See Comments)    Reaction:  Unknown   . Codeine Other (See Comments)    Reaction:  Unknown   . Contrast Media [Iodinated Diagnostic Agents] Other (See Comments)    Reaction:  Unknown   . Ibuprofen Other (See Comments)    Reaction:  Unknown   . Solu-Medrol [Methylprednisolone Acetate] Other (See Comments)    Reaction:  Unknown     VITALS:  Blood pressure 104/61, pulse 70, temperature 97.5 F (36.4 C), temperature source Oral, resp. rate 20, height 5\' 10"  (1.778 m), weight 90.992 kg (200 lb 9.6 oz), SpO2 92 %.  PHYSICAL EXAMINATION:   Physical Exam  GENERAL:  59 y.o.-year-old patient lying in the bed in NAD.  EYES: Pupils equal, round, reactive to light and accommodation. No scleral icterus. Extraocular muscles intact.  HEENT: Head  atraumatic, normocephalic. Oropharynx and nasopharynx clear.  NECK:  Supple, no jugular venous distention. No thyroid enlargement, no tenderness.  LUNGS: Prolonged inspiratory & Exp. Phase.  Minimal end-exp. wheezing. No use of accessory muscles of respiration. No dullness to percussion.  CARDIOVASCULAR: S1, S2 normal. No murmurs, rubs, or gallops.  ABDOMEN: Soft, nontender, nondistended. Bowel sounds present. No organomegaly or mass.  EXTREMITIES: No cyanosis, clubbing or edema b/l.    NEUROLOGIC: Cranial nerves II through XII are intact. No focal Motor or sensory deficits b/l.   PSYCHIATRIC: The patient is alert and oriented x 3. Good Affect.  SKIN: No obvious rash, lesion, or ulcer.    LABORATORY PANEL:   CBC  Recent Labs Lab 04/03/15 0432  WBC 10.2  HGB 11.9*  HCT 36.6*  PLT 198   ------------------------------------------------------------------------------------------------------------------  Chemistries   Recent Labs Lab 04/03/15 0432  NA 142  K 4.5  CL 104  CO2 33*  GLUCOSE 135*  BUN 18  CREATININE 0.80  CALCIUM 8.6*   ------------------------------------------------------------------------------------------------------------------  Cardiac Enzymes No results for input(s): TROPONINI in the last 168 hours. ------------------------------------------------------------------------------------------------------------------  RADIOLOGY:  Dg Chest 2 View  04/02/2015   CLINICAL DATA:  Shortness of breath.  EXAM: CHEST  2 VIEW  COMPARISON:  March 31, 2015.  FINDINGS: No pneumothorax is noted. Stable cardiomediastinal silhouette. Increased bibasilar opacities are noted concerning for probable bilateral pleural effusions with associated atelectasis or infiltrate. Bony thorax is intact.  IMPRESSION: Mild bibasilar opacities are noted concerning  for probable pleural effusions with associated atelectasis or infiltrate. Follow-up radiographs are recommended.   Electronically  Signed   By: Lupita Raider, M.D.   On: 04/02/2015 13:54     ASSESSMENT AND PLAN:   59 year old male with past medical history of COPD, anxiety, CHF, depression, GERD who presented to the hospital due to shortness of breath and noted to be in acute on chronic respiratory failure due to COPD exacerbation.  #1 Acute on chronic respiratory failure due to COPD exacerbation-continue IV steroids, DuoNeb nebs around-the-clock,Spiriva, Pulmocort nebs, theophylline.  -Patient is already on oxygen at home at 2 L. - O2 has been weaned down to 3 L Today.  Will check ambulatory sats today.    #2 COPD exacerbation- continue Solumedrol, duonebs ATC, Theophylline, Spiriva.  -  Slow to improve. - already on O2 at home.  Sputum Cx pending.  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 IV Vanco, Zithromax for now.   - follows w/ Dr. Freda Munro as outpatient.   #3 Pulmonary nodule- recommended CT scan of the chest in 6 months to ensure stability.  #4 History of CHF- clinically not in CHF.   #5 Hypertension essential - continue Cardizem  #6 Depression-continue low-dose Lexapro.  #7 generalized Weakness- seen by physical therapy and recommended home health services.    All the records are reviewed and case discussed with Care Management/Social Workerr. Management plans discussed with the patient, family and they are in agreement.  CODE STATUS: Full  DVT Prophylaxis: Heparin subcutaneous  TOTAL TIME TAKING CARE OF THIS PATIENT: 25 minutes.   POSSIBLE D/C tomorrow a.m., DEPENDING ON CLINICAL CONDITION.   Houston Siren M.D on 04/04/2015 at 9:17 AM  Between 7am to 6pm - Pager - [phone]  After 6pm go to [email or web address] - password [email or web address] [facility]  [place] [facility]  Office  [phone]  CC: Primary care physician; Lyndon Code, MD

## 2015-04-04 NOTE — Plan of Care (Signed)
Problem: Discharge Progression Outcomes Goal: Discharge plan in place and appropriate Individualism Outcome: Progressing Individualization of Care Pt prefers to be called Dustin [name]  Hx of Asthma and COPD    Goal: Other Discharge Outcomes/Goals Outcome: Progressing 1.  Patient without complaints of pain this shift. 2.  Patient on 4 L of O2 with sat of 92%.   3.  Hemodynamically stable.  Bilateral lungs are clear but diminished.   Schedule inhalers and Duo-Neb treatments ATC.  Encouraged use of Incentive spirometer with patient reaching 1750 above goal of 1000.  Continues on antibiotics.  BP 116/62 mmHg  Pulse 93  Temp(Src) 98.3 F (36.8 C) (Oral)  Resp 20  Ht 5\' 10"  (1.778 m)  Wt 200 lb 9.6 oz (90.992 kg)  BMI 28.78 kg/m2  SpO2 93% 4.  Patient continues to cough up thick yellow/green mucus.  No other s/s of complications. 5.  Patient on heart health diet which he is tolerating well. 6.  Patient remained in bed this shift but repositioned every 2 hours.

## 2015-04-04 NOTE — Plan of Care (Signed)
Problem: Discharge Progression Outcomes Goal: Other Discharge Outcomes/Goals Outcome: Progressing Progress to goals: 1. No acute episodes dysnea. DOE without acute distress. Up to chair with 1+ and tolerated well. 2. 02 sats > than 90% at rest with 02 adjusted down to 3l/[place] and tolerating well so far. 3. Pt experiences chronic anxiety that may limit activity at times. Xanax requested for anxiety on regular basis. 4. Anticipating discharge home in day or two if continues to progress. 5. Up to Chair +1 with pulse 89-90% with exertion and improved to 92 % at rest with 02 @ 3l/. Pt consult requested to reassess for needs. 6. VSS. Labs stable.  7. Productive cough more loose with large amount green purulent sputum produced.  Problem: Discharge Progression Outcomes Goal: Tolerating diet Outcome: Progressing Tolerating diet well.

## 2015-04-05 LAB — CULTURE, RESPIRATORY: SPECIAL REQUESTS: NORMAL

## 2015-04-05 LAB — CULTURE, RESPIRATORY W GRAM STAIN

## 2015-04-05 MED ORDER — PREDNISONE 10 MG PO TABS: ORAL_TABLET | ORAL | Status: DC

## 2015-04-05 MED ORDER — SULFAMETHOXAZOLE-TRIMETHOPRIM 800-160 MG PO TABS: 1.0000 | ORAL_TABLET | Freq: Two times a day (BID) | ORAL | Status: DC

## 2015-04-05 MED ORDER — MOMETASONE FURO-FORMOTEROL FUM 100-5 MCG/ACT IN AERO: 2.0000 | INHALATION_SPRAY | Freq: Two times a day (BID) | RESPIRATORY_TRACT | Status: DC

## 2015-04-05 MED ORDER — LEVOFLOXACIN 500 MG PO TABS: 500.0000 mg | ORAL_TABLET | Freq: Every day | ORAL | Status: DC

## 2015-04-05 NOTE — Discharge Summary (Signed)
[facility] - [place] at [facility]   PATIENT NAME: Dustin [name]    MR#:  [id number]  DATE OF BIRTH:  02/27/1956  DATE OF ADMISSION:  03/31/2015 ADMITTING PHYSICIAN: Wyatt Haste, MD  DATE OF DISCHARGE: 04/05/2015  PRIMARY CARE PHYSICIAN: Lyndon Code, MD    ADMISSION DIAGNOSIS:  Acute exacerbation of chronic obstructive pulmonary disease (COPD) [J44.1] Hypoxia [R09.02]  DISCHARGE DIAGNOSIS:  Principal Problem:   COPD exacerbation  MRSA pneumonia  SECONDARY DIAGNOSIS:   Past Medical History  Diagnosis Date  . Asthma   . COPD (chronic obstructive pulmonary disease)   . Allergy   . Anxiety   . CHF (congestive heart failure)   . Depression   . Emphysema of lung   . GERD (gastroesophageal reflux disease)     HOSPITAL COURSE:   59 year old male with past medical history of COPD, anxiety, CHF, depression, GERD who presented to the hospital due to shortness of breath and noted to be in acute on chronic respiratory failure due to COPD exacerbation.  #1 Acute on chronic respiratory failure due to COPD exacerbation-patient was admitted to the hospital and started on aggressive therapy with IV steroids, DuoNeb nebs around-the-clock, maintained on his theophylline and also started on Dulera/Spiriva. -Patient's sputum cultures were positive for MRSA and therefore he was also treated with IV vancomycin. -Patient has clinically improved with aggressive therapy and his O2 has been weaned down from 5 L to 2-1/2 L which is baseline for him. Patient therefore is being discharged on long oral prednisone taper and oral antibiotics. Patient will follow-up with his pulmonologist Dr. Freda Munro as outpatient.  #2 COPD exacerbation- likely due to MRSA pneumonia. Patient's sputum culture was positive for MRSA and also Haemophilus. -Patient was aggressively treated with IV steroids, DuoNeb's, Spiriva, Dulera, and empiric antibiotics as stated above. -Patient has  clinically improved and therefore not being discharged on oral prednisone taper and oral antibiotics. Patient is on oxygen at home and will continue that.  #3 Pulmonary nodule- recommended CT scan of the chest in 6 months to ensure stability.  #4 History of CHF- clinically not in CHF.   #5 Hypertension essential - continue Cardizem  #6 Depression-continue low-dose Lexapro.  Patient was seen by physical therapy and they recommended home health services and therefore patient is being discharged with home health PT/nursing services.   DISCHARGE CONDITIONS:   Stable  CONSULTS OBTAINED:  Treatment Team:  Wyatt Haste, MD  DRUG ALLERGIES:   Allergies  Allergen Reactions  . Asa [Aspirin] Other (See Comments)    Reaction:  Unknown   . Codeine Other (See Comments)    Reaction:  Unknown   . Contrast Media [Iodinated Diagnostic Agents] Other (See Comments)    Reaction:  Unknown   . Ibuprofen Other (See Comments)    Reaction:  Unknown   . Solu-Medrol [Methylprednisolone Acetate] Other (See Comments)    Reaction:  Unknown     DISCHARGE MEDICATIONS:   Current Discharge Medication List    START taking these medications   Details  levofloxacin (LEVAQUIN) 500 MG tablet Take 1 tablet (500 mg total) by mouth daily. Qty: 10 tablet, Refills: 0    mometasone-formoterol (DULERA) 100-5 MCG/ACT AERO Inhale 2 puffs into the lungs 2 (two) times daily. Qty: 1 Inhaler, Refills: 2    predniSONE (DELTASONE) 10 MG tablet Label  & dispense according to the schedule below. 5 Pills PO for 2 days then, 4 Pills PO for 2 days, 3 Pills  PO for 2 days, 2 Pills PO for 2 days, 1 Pill PO for 2 days then STOP. Qty: 30 tablet, Refills: 0    sulfamethoxazole-trimethoprim (BACTRIM DS,SEPTRA DS) 800-160 MG per tablet Take 1 tablet by mouth 2 (two) times daily. Qty: 20 tablet, Refills: 0      CONTINUE these medications which have NOT CHANGED   Details  Albuterol Sulfate (PROAIR RESPICLICK) 108 (90 BASE)  MCG/ACT AEPB Inhale 2 puffs into the lungs every 4 (four) hours as needed (for shortness of breath).    ALPRAZolam (XANAX) 1 MG tablet Take 0.5-1 mg by mouth 3 (three) times daily as needed for anxiety.    diltiazem (CARDIZEM) 120 MG tablet Take 120 mg by mouth daily.    doxazosin (CARDURA) 4 MG tablet Take 4 mg by mouth daily.    escitalopram (LEXAPRO) 20 MG tablet Take 20 mg by mouth daily.    gabapentin (NEURONTIN) 100 MG capsule Take 200 mg by mouth 3 (three) times daily.    ipratropium-albuterol (DUONEB) 0.5-2.5 (3) MG/3ML SOLN Take 3 mLs by nebulization 4 (four) times daily as needed (for shortness of breath).    theophylline (THEO-24) 400 MG 24 hr capsule Take 400 mg by mouth daily.         DISCHARGE INSTRUCTIONS:   DIET:  Cardiac diet  DISCHARGE CONDITION:  Stable  ACTIVITY:  Activity as tolerated  OXYGEN:  Home Oxygen: Yes.     Oxygen Delivery: 2 liters/min via Patient connected to nasal cannula oxygen  DISCHARGE LOCATION:  Home with home health nursing/physical therapy.   If you experience worsening of your admission symptoms, develop shortness of breath, life threatening emergency, suicidal or homicidal thoughts you must seek medical attention immediately by calling 911 or calling your MD immediately  if symptoms less severe.  You Must read complete instructions/literature along with all the possible adverse reactions/side effects for all the Medicines you take and that have been prescribed to you. Take any new Medicines after you have completely understood and accpet all the possible adverse reactions/side effects.   Please note  You were cared for by a hospitalist during your hospital stay. If you have any questions about your discharge medications or the care you received while you were in the hospital after you are discharged, you can call the unit and asked to speak with the hospitalist on call if the hospitalist that took care of you is not available.  Once you are discharged, your primary care physician will handle any further medical issues. Please note that NO REFILLS for any discharge medications will be authorized once you are discharged, as it is imperative that you return to your primary care physician (or establish a relationship with a primary care physician if you do not have one) for your aftercare needs so that they can reassess your need for medications and monitor your lab values.     Today   Still continues to have a cough which is productive. Although shortness of breath, wheezing has significantly improved.  VITAL SIGNS:  Blood pressure 115/54, pulse 88, temperature 97.9 F (36.6 C), temperature source Oral, resp. rate 18, height 5\' 10"  (1.778 m), weight 90.992 kg (200 lb 9.6 oz), SpO2 92 %.  I/O:   Intake/Output Summary (Last 24 hours) at 04/05/15 1459 Last data filed at 04/04/15 1820  Gross per 24 hour  Intake    240 ml  Output    430 ml  Net   -190 ml    PHYSICAL EXAMINATION:  GENERAL:  58 y.o.-year-old patient lying in the bed with no acute distress.  EYES: Pupils equal, round, reactive to light and accommodation. No scleral icterus. Extraocular muscles intact.  HEENT: Head atraumatic, normocephalic. Oropharynx and nasopharynx clear.  NECK:  Supple, no jugular venous distention. No thyroid enlargement, no tenderness.  LUNGS: Prolonged inspiratory and expiratory phase. Minimal end expiratory wheezing. No use of accessory muscles of respiration.  CARDIOVASCULAR: S1, S2 RRR. No murmurs, rubs, or gallops.  ABDOMEN: Soft, non-tender, non-distended. Bowel sounds present. No organomegaly or mass.  EXTREMITIES: No pedal edema, cyanosis, or clubbing.  NEUROLOGIC: Cranial nerves II through XII are intact. No focal motor or sensory defecits b/l.  PSYCHIATRIC: The patient is alert and oriented x 3. Good affect.  SKIN: No obvious rash, lesion, or ulcer.   DATA REVIEW:   CBC  Recent Labs Lab 04/03/15 0432  WBC 10.2   HGB 11.9*  HCT 36.6*  PLT 198    Chemistries   Recent Labs Lab 04/03/15 0432  NA 142  K 4.5  CL 104  CO2 33*  GLUCOSE 135*  BUN 18  CREATININE 0.80  CALCIUM 8.6*    Cardiac Enzymes No results for input(s): TROPONINI in the last 168 hours.  Microbiology Results  Results for orders placed or performed during the hospital encounter of 03/31/15  MRSA PCR Screening     Status: None   Collection Time: 04/01/15  1:20 PM  Result Value Ref Range Status   MRSA by PCR NEGATIVE NEGATIVE Final    Comment:        The GeneXpert MRSA Assay (FDA approved for NASAL specimens only), is one component of a comprehensive MRSA colonization surveillance program. It is not intended to diagnose MRSA infection nor to guide or monitor treatment for MRSA infections.   Culture, expectorated sputum-assessment     Status: None   Collection Time: 04/01/15  2:45 PM  Result Value Ref Range Status   Specimen Description SPUTUM  Final   Special Requests Normal  Final   Sputum evaluation THIS SPECIMEN IS ACCEPTABLE FOR SPUTUM CULTURE  Final   Report Status 04/01/2015 FINAL  Final  Culture, respiratory (NON-Expectorated)     Status: None   Collection Time: 04/01/15  2:45 PM  Result Value Ref Range Status   Specimen Description SPUTUM  Final   Special Requests Normal Reflexed from [id number]  Final   Gram Stain   Final    MANY WBC SEEN RARE GRAM NEGATIVE RODS RARE GRAM POSITIVE COCCI    Culture   Final    RARE GROWTH METHICILLIN RESISTANT STAPHYLOCOCCUS AUREUS MODERATE GROWTH HAEMOPHILUS INFLUENZAE CRITICAL RESULT CALLED TO, READ BACK BY AND VERIFIED WITH: DR. Cherlynn Kaiser AT 0454 04/04/15 CTJ    Report Status 04/05/2015 FINAL  Final   Organism ID, Bacteria METHICILLIN RESISTANT STAPHYLOCOCCUS AUREUS  Final   Organism ID, Bacteria HAEMOPHILUS INFLUENZAE  Final      Susceptibility   Methicillin resistant staphylococcus aureus - MIC*    CIPROFLOXACIN >=8 RESISTANT Resistant     GENTAMICIN <=0.5  SENSITIVE Sensitive     OXACILLIN Value in next row Resistant      >=4 MODERATELY RESISTANT    VANCOMYCIN <=0.5 SENSITIVE Sensitive     TRIMETH/SULFA <=10 SENSITIVE Sensitive     CEFOXITIN SCREEN Value in next row Resistant      POSITIVECEFOXITIN SCREEN - This test may be used to predict mecA-mediated oxacillin resistance, and it is based on the cefoxitin disk screen test.  The cefoxitin screen  and oxacillin work in combination to determine the final interpretation reported for oxacillin.     Inducible Clindamycin Value in next row Sensitive      POSITIVECEFOXITIN SCREEN - This test may be used to predict mecA-mediated oxacillin resistance, and it is based on the cefoxitin disk screen test.  The cefoxitin screen and oxacillin work in combination to determine the final interpretation reported for oxacillin.     ERYTHROMYCIN Value in next row Resistant      RESISTANT>=8    TETRACYCLINE Value in next row Sensitive      SENSITIVE<=1    CLINDAMYCIN Value in next row Resistant      RESISTANT>=8    LINEZOLID Value in next row Sensitive      SENSITIVE2    LEVOFLOXACIN Value in next row Resistant      RESISTANT>=8    * RARE GROWTH METHICILLIN RESISTANT STAPHYLOCOCCUS AUREUS    RADIOLOGY:  No results found.    Management plans discussed with the patient, family and they are in agreement.  CODE STATUS:     Code Status Orders        Start     Ordered   03/31/15 2050  Full code   Continuous     03/31/15 2049      TOTAL TIME TAKING CARE OF THIS PATIENT: 40 minutes.    [name],Lynnea Vandervoort J M.D on 04/05/2015 at 2:59 PM  Between 7am to 6pm - Pager - [phone]  After 6pm go to [email or web address] - password [email or web address] [facility]  [facility]  Office  [phone]  CC: Primary care physician; Lyndon CodeKHAN, FOZIA M, MD

## 2015-04-05 NOTE — Progress Notes (Addendum)
MD ordered discharge to home with home health.  Reviewed discharge instruction with patient.  Verbalized understanding of dc instructions. Pt's daughter transported pt home.  Pt discharged.

## 2015-04-05 NOTE — Care Management Important Message (Signed)
Important Message  Patient Details  Name: Dustin CoriaDennis R [name] MRN: [id number] Date of Birth: 1955/09/29   Medicare Important Message Given:  Yes-third notification given    Gwenette GreetBrenda S Holland 04/05/2015, 12:04 PM

## 2015-04-05 NOTE — Care Management (Signed)
Discharge to home today per Dr. Cherlynn KaiserSainani. Spoke with [name] at the bedside. [facility], States that he was having a hard time paying for his medications, States he would like to apply for Medicaid. Discussed that he would need to call the department of social services and arrange an appointment [facility]. Will give Department of Social Services information and also give him a [employer] application. Gwenette GreetBrenda S Holland RN MSN Care Management [phone]

## 2015-04-05 NOTE — Plan of Care (Signed)
Problem: Discharge Progression Outcomes Goal: Discharge plan in place and appropriate Individualism Outcome: Progressing Individualization of Care Pt prefers to be called Dustin [name]  Hx of Asthma and COPD   Goal: Other Discharge Outcomes/Goals Outcome: Progressing 1. Patient without complaints of pain this shift. 2. Patient on 2 L of O2 with sat of 91%.  3. Hemodynamically stable. Bilateral lungs are clear but diminished. Schedule inhalers and Duo-Neb treatments ATC. Encouraged use of Incentive spirometer with patient reaching 1750 above goal of 1000. Continues on antibiotics. BP 124/71 mmHg  Pulse 85  Temp(Src) 97.7 F (36.5 C) (Oral)  Resp 18  Ht 5\' 10"  (1.778 m)  Wt 200 lb 9.6 oz (90.992 kg)  BMI 28.78 kg/m2  SpO2 91% 4. Patient continues to cough up thick yellow/green mucus. Patient on droplet precautions this shift due to positive  MRSA results from sputum sample. 5. Patient on heart health diet which he is tolerating well. 6. Patient remained in bed this shift but repositioned every 2 hours.

## 2015-04-05 NOTE — Discharge Instructions (Signed)
DIET:  Cardiac diet  DISCHARGE CONDITION:  Stable  ACTIVITY:  Activity as tolerated  OXYGEN:  Home Oxygen: Yes.     Oxygen Delivery: 2 liters/min via Patient connected to nasal cannula oxygen  DISCHARGE LOCATION:  Home with  Home health Nursing/PT   If you experience worsening of your admission symptoms, develop shortness of breath, life threatening emergency, suicidal or homicidal thoughts you must seek medical attention immediately by calling 911 or calling your MD immediately  if symptoms less severe.  You Must read complete instructions/literature along with all the possible adverse reactions/side effects for all the Medicines you take and that have been prescribed to you. Take any new Medicines after you have completely understood and accpet all the possible adverse reactions/side effects.   Please note  You were cared for by a hospitalist during your hospital stay. If you have any questions about your discharge medications or the care you received while you were in the hospital after you are discharged, you can call the unit and asked to speak with the hospitalist on call if the hospitalist that took care of you is not available. Once you are discharged, your primary care physician will handle any further medical issues. Please note that NO REFILLS for any discharge medications will be authorized once you are discharged, as it is imperative that you return to your primary care physician (or establish a relationship with a primary care physician if you do not have one) for your aftercare needs so that they can reassess your need for medications and monitor your lab values.    Acute Respiratory Distress Syndrome  Acute respiratory distress syndrome (ARDS) is a serious, life-threatening lung condition that can cause breathing failure. It occurs in people who are critically ill or in people who have had a serious injury.  CAUSES  ARDS occurs when small blood vessels in the lungs  leak fluid into the air sacs (alveoli) of the lungs. The fluid causes the lungs to become "stiff" and decreases their ability to inflate. The fluid also prevents oxygen from being absorbed into the bloodstream. When the bloodstream does not have enough oxygen, the body's vital organs do not get enough oxygen to function properly. ARDS can occur in the following conditions:  Sepsis. This is a serious bloodstream infection.  Serious injury (trauma) to the head or chest.  Pneumonia.  After major surgery, such as a lung transplant.  Drug overdose.  Breathing (inhalation) of harmful chemicals. SYMPTOMS  ARDS comes on quickly (rapid onset) and can occur within 24 to 48 hours of an infection, illness, surgery, or injury. Symptoms include:  Shortness of breath or difficulty breathing.  Cyanosis. This is a bluish color to the skin or nail beds (due to low oxygen levels in the blood).  Fast or irregular heart rate.  Low blood pressure (hypotension).  Organ failure. DIAGNOSIS  There is not a specific test to diagnose ARDS. It is usually diagnosed when other diseases and conditions that cause similar symptoms have been ruled out. When a person is thought to have ARDS, the following tests may be performed:  A chest X-ray or computed tomography (CT) scan to look at the lungs.  Arterial blood gas (ABG) analysis. This test looks at the oxygen level in the blood.  Blood tests to rule out infection.  Sputum culture to rule out a lung infection.  Bronchoscopy. TREATMENT  ARDS is a critical condition. People who develop ARDS need to be in a hospital intensive care unit (  ICU). Treatment of ARDS includes:  Providing oxygenation. This is a main treatment goal of ARDS. A breathing machine (ventilator) is often used to help a person breathe and to provide oxygen. When on a breathing machine, medicine is given to keep patients asleep (sedated).  Treatment of the underlying cause of ARDS (infection,  illness, or trauma).  Supportive treatment such as:  Intravenous (IV) fluids.  Liquid nutrition that goes through an IV or feeding tube.  Blood pressure medicine to support low blood pressure.  Antibiotic medicine to help fight infection.  Steroid medicine to help decrease swelling (inflammation) in the lungs.  Diuretic medicine to get rid of extra fluid in the body. HOME CARE INSTRUCTIONS  After recovering from ARDS, you may have weakness, shortness of breath, or memory problems. You may also suffer from depression or from complications of the illness that caused ARDS. You can do several things to help your recovery:  Do not smoke.  If you drink alcohol, limit the amount of alcohol you drink to 1 or 2 drinks a day.  Be sure you get a yearly flu (influenza) shot. You should get a pneumonia vaccine once every 5 years.  Ask your caregiver about lung rehabilitation programs.  Ask your caregiver about local support groups for people with breathing problems.  Ask friends and family to help you if daily activities make you tired. SEEK MEDICAL CARE IF:   You become short of breath with activity or while at rest.  You develop a cough that does not go away. SEEK IMMEDIATE MEDICAL CARE IF:   You have sudden shortness of breath with or without chest pain.  You have chest pain that does not go away.  You develop swelling or pain in one of your legs.  You have trauma to your chest or any other part of your body.  You have a fever.  You overdose or have a reaction to your medicine. MAKE SURE YOU:   Understand these instructions.  Will watch your condition.  Will get help right away if you are not doing well or get worse. Document Released: 09/04/2005 Document Revised: 01/19/2014 Document Reviewed: 05/24/2011 [facility] Patient Information 2015 [place], [place]. This information is not intended to replace advice given to you by your health care provider. Make sure you discuss any  questions you have with your health care provider.  Acute Respiratory Distress Syndrome  Acute respiratory distress syndrome (ARDS) is a serious, life-threatening lung condition that can cause breathing failure. It occurs in people who are critically ill or in people who have had a serious injury.  CAUSES  ARDS occurs when small blood vessels in the lungs leak fluid into the air sacs (alveoli) of the lungs. The fluid causes the lungs to become "stiff" and decreases their ability to inflate. The fluid also prevents oxygen from being absorbed into the bloodstream. When the bloodstream does not have enough oxygen, the body's vital organs do not get enough oxygen to function properly. ARDS can occur in the following conditions:  Sepsis. This is a serious bloodstream infection.  Serious injury (trauma) to the head or chest.  Pneumonia.  After major surgery, such as a lung transplant.  Drug overdose.  Breathing (inhalation) of harmful chemicals. SYMPTOMS  ARDS comes on quickly (rapid onset) and can occur within 24 to 48 hours of an infection, illness, surgery, or injury. Symptoms include:  Shortness of breath or difficulty breathing.  Cyanosis. This is a bluish color to the skin or nail beds (  due to low oxygen levels in the blood).  Fast or irregular heart rate.  Low blood pressure (hypotension).  Organ failure. DIAGNOSIS  There is not a specific test to diagnose ARDS. It is usually diagnosed when other diseases and conditions that cause similar symptoms have been ruled out. When a person is thought to have ARDS, the following tests may be performed:  A chest X-ray or computed tomography (CT) scan to look at the lungs.  Arterial blood gas (ABG) analysis. This test looks at the oxygen level in the blood.  Blood tests to rule out infection.  Sputum culture to rule out a lung infection.  Bronchoscopy. TREATMENT  ARDS is a critical condition. People who develop ARDS need to be in a  hospital intensive care unit (ICU). Treatment of ARDS includes:  Providing oxygenation. This is a main treatment goal of ARDS. A breathing machine (ventilator) is often used to help a person breathe and to provide oxygen. When on a breathing machine, medicine is given to keep patients asleep (sedated).  Treatment of the underlying cause of ARDS (infection, illness, or trauma).  Supportive treatment such as:  Intravenous (IV) fluids.  Liquid nutrition that goes through an IV or feeding tube.  Blood pressure medicine to support low blood pressure.  Antibiotic medicine to help fight infection.  Steroid medicine to help decrease swelling (inflammation) in the lungs.  Diuretic medicine to get rid of extra fluid in the body. HOME CARE INSTRUCTIONS  After recovering from ARDS, you may have weakness, shortness of breath, or memory problems. You may also suffer from depression or from complications of the illness that caused ARDS. You can do several things to help your recovery:  Do not smoke.  If you drink alcohol, limit the amount of alcohol you drink to 1 or 2 drinks a day.  Be sure you get a yearly flu (influenza) shot. You should get a pneumonia vaccine once every 5 years.  Ask your caregiver about lung rehabilitation programs.  Ask your caregiver about local support groups for people with breathing problems.  Ask friends and family to help you if daily activities make you tired. SEEK MEDICAL CARE IF:   You become short of breath with activity or while at rest.  You develop a cough that does not go away. SEEK IMMEDIATE MEDICAL CARE IF:   You have sudden shortness of breath with or without chest pain.  You have chest pain that does not go away.  You develop swelling or pain in one of your legs.  You have trauma to your chest or any other part of your body.  You have a fever.  You overdose or have a reaction to your medicine. MAKE SURE YOU:   Understand these  instructions.  Will watch your condition.  Will get help right away if you are not doing well or get worse. Document Released: 09/04/2005 Document Revised: 01/19/2014 Document Reviewed: 05/24/2011 [facility] Patient Information 2015 [place], [place]. This information is not intended to replace advice given to you by your health care provider. Make sure you discuss any questions you have with your health care provider.  Asthma Attack Prevention Although there is no way to prevent asthma from starting, you can take steps to control the disease and reduce its symptoms. Learn about your asthma and how to control it. Take an active role to control your asthma by working with your health care provider to create and follow an asthma action plan. An asthma action plan guides  you in:  Taking your medicines properly.  Avoiding things that set off your asthma or make your asthma worse (asthma triggers).  Tracking your level of asthma control.  Responding to worsening asthma.  Seeking emergency care when needed. To track your asthma, keep records of your symptoms, check your peak flow number using a handheld device that shows how well air moves out of your lungs (peak flow meter), and get regular asthma checkups.  WHAT ARE SOME WAYS TO PREVENT AN ASTHMA ATTACK?  Take medicines as directed by your health care provider.  Keep track of your asthma symptoms and level of control.  With your health care provider, write a detailed plan for taking medicines and managing an asthma attack. Then be sure to follow your action plan. Asthma is an ongoing condition that needs regular monitoring and treatment.  Identify and avoid asthma triggers. Many outdoor allergens and irritants (such as pollen, mold, cold air, and air pollution) can trigger asthma attacks. Find out what your asthma triggers are and take steps to avoid them.  Monitor your breathing. Learn to recognize warning signs of an attack, such as  coughing, wheezing, or shortness of breath. Your lung function may decrease before you notice any signs or symptoms, so regularly measure and record your peak airflow with a home peak flow meter.  Identify and treat attacks early. If you act quickly, you are less likely to have a severe attack. You will also need less medicine to control your symptoms. When your peak flow measurements decrease and alert you to an upcoming attack, take your medicine as instructed and immediately stop any activity that may have triggered the attack. If your symptoms do not improve, get medical help.  Pay attention to increasing quick-relief inhaler use. If you find yourself relying on your quick-relief inhaler, your asthma is not under control. See your health care provider about adjusting your treatment. WHAT CAN MAKE MY SYMPTOMS WORSE? A number of common things can set off or make your asthma symptoms worse and cause temporary increased inflammation of your airways. Keep track of your asthma symptoms for several weeks, detailing all the environmental and emotional factors that are linked with your asthma. When you have an asthma attack, go back to your asthma diary to see which factor, or combination of factors, might have contributed to it. Once you know what these factors are, you can take steps to control many of them. If you have allergies and asthma, it is important to take asthma prevention steps at home. Minimizing contact with the substance to which you are allergic will help prevent an asthma attack. Some triggers and ways to avoid these triggers are: Animal Dander:  Some people are allergic to the flakes of skin or dried saliva from animals with fur or feathers.   There is no such thing as a hypoallergenic dog or cat breed. All dogs or cats can cause allergies, even if they don't shed.  Keep these pets out of your home.  If you are not able to keep a pet outdoors, keep the pet out of your bedroom and other  sleeping areas at all times, and keep the door closed.  Remove carpets and furniture covered with cloth from your home. If that is not possible, keep the pet away from fabric-covered furniture and carpets. Dust Mites: Many people with asthma are allergic to dust mites. Dust mites are tiny bugs that are found in every home in mattresses, pillows, carpets, fabric-covered furniture, bedcovers, clothes,  stuffed toys, and other fabric-covered items.   Cover your mattress in a special dust-proof cover.  Cover your pillow in a special dust-proof cover, or wash the pillow each week in hot water. Water must be hotter than 130 F (54.4 C) to kill dust mites. Cold or warm water used with detergent and bleach can also be effective.  Wash the sheets and blankets on your bed each week in hot water.  Try not to sleep or lie on cloth-covered cushions.  Call ahead when traveling and ask for a smoke-free hotel room. Bring your own bedding and pillows in case the hotel only supplies feather pillows and down comforters, which may contain dust mites and cause asthma symptoms.  Remove carpets from your bedroom and those laid on concrete, if you can.  Keep stuffed toys out of the bed, or wash the toys weekly in hot water or cooler water with detergent and bleach. Cockroaches: Many people with asthma are allergic to the droppings and remains of cockroaches.   Keep food and garbage in closed containers. Never leave food out.  Use poison baits, traps, powders, gels, or paste (for example, boric acid).  If a spray is used to kill cockroaches, stay out of the room until the odor goes away. Indoor Mold:  Fix leaky faucets, pipes, or other sources of water that have mold around them.  Clean floors and moldy surfaces with a fungicide or diluted bleach.  Avoid using humidifiers, vaporizers, or swamp coolers. These can spread molds through the air. Pollen and Outdoor Mold:  When pollen or mold spore counts are  high, try to keep your windows closed.  Stay indoors with windows closed from late morning to afternoon. Pollen and some mold spore counts are highest at that time.  Ask your health care provider whether you need to take anti-inflammatory medicine or increase your dose of the medicine before your allergy season starts. Other Irritants to Avoid:  Tobacco smoke is an irritant. If you smoke, ask your health care provider how you can quit. Ask family members to quit smoking, too. Do not allow smoking in your home or car.  If possible, do not use a wood-burning stove, kerosene heater, or fireplace. Minimize exposure to all sources of smoke, including incense, candles, fires, and fireworks.  Try to stay away from strong odors and sprays, such as perfume, talcum powder, hair spray, and paints.  Decrease humidity in your home and use an indoor air cleaning device. Reduce indoor humidity to below 60%. Dehumidifiers or central air conditioners can do this.  Decrease house dust exposure by changing furnace and air cooler filters frequently.  Try to have someone else vacuum for you once or twice a week. Stay out of rooms while they are being vacuumed and for a short while afterward.  If you vacuum, use a dust mask from a hardware store, a double-layered or microfilter vacuum cleaner bag, or a vacuum cleaner with a HEPA filter.  Sulfites in foods and beverages can be irritants. Do not drink beer or wine or eat dried fruit, processed potatoes, or shrimp if they cause asthma symptoms.  Cold air can trigger an asthma attack. Cover your nose and mouth with a scarf on cold or windy days.  Several health conditions can make asthma more difficult to manage, including a runny nose, sinus infections, reflux disease, psychological stress, and sleep apnea. Work with your health care provider to manage these conditions.  Avoid close contact with people who have a  respiratory infection such as a cold or the flu,  since your asthma symptoms may get worse if you catch the infection. Wash your hands thoroughly after touching items that may have been handled by people with a respiratory infection.  Get a flu shot every year to protect against the flu virus, which often makes asthma worse for days or weeks. Also get a pneumonia shot if you have not previously had one. Unlike the flu shot, the pneumonia shot does not need to be given yearly. Medicines:  Talk to your health care provider about whether it is safe for you to take aspirin or non-steroidal anti-inflammatory medicines (NSAIDs). In a small number of people with asthma, aspirin and NSAIDs can cause asthma attacks. These medicines must be avoided by people who have known aspirin-sensitive asthma. It is important that people with aspirin-sensitive asthma read labels of all over-the-counter medicines used to treat pain, colds, coughs, and fever.  Beta-blockers and ACE inhibitors are other medicines you should discuss with your health care provider. HOW CAN I FIND OUT WHAT I AM ALLERGIC TO? Ask your asthma health care provider about allergy skin testing or blood testing (the RAST test) to identify the allergens to which you are sensitive. If you are found to have allergies, the most important thing to do is to try to avoid exposure to any allergens that you are sensitive to as much as possible. Other treatments for allergies, such as medicines and allergy shots (immunotherapy) are available.  CAN I EXERCISE? Follow your health care provider's advice regarding asthma treatment before exercising. It is important to maintain a regular exercise program, but vigorous exercise or exercise in cold, humid, or dry environments can cause asthma attacks, especially for those people who have exercise-induced asthma. Document Released: 08/23/2009 Document Revised: 09/09/2013 Document Reviewed: 03/12/2013 [facility] Patient Information 2015 [place], [place]. This information is  not intended to replace advice given to you by your health care provider. Make sure you discuss any questions you have with your health care provider.

## 2015-04-07 DIAGNOSIS — F419 Anxiety disorder, unspecified: Secondary | ICD-10-CM | POA: Diagnosis not present

## 2015-04-07 DIAGNOSIS — J15211 Pneumonia due to Methicillin susceptible Staphylococcus aureus: Secondary | ICD-10-CM | POA: Diagnosis not present

## 2015-04-07 DIAGNOSIS — F328 Other depressive episodes: Secondary | ICD-10-CM | POA: Diagnosis not present

## 2015-04-07 DIAGNOSIS — I5030 Unspecified diastolic (congestive) heart failure: Secondary | ICD-10-CM | POA: Diagnosis not present

## 2015-04-07 DIAGNOSIS — Z9981 Dependence on supplemental oxygen: Secondary | ICD-10-CM | POA: Diagnosis not present

## 2015-04-07 DIAGNOSIS — G9009 Other idiopathic peripheral autonomic neuropathy: Secondary | ICD-10-CM | POA: Diagnosis not present

## 2015-04-07 DIAGNOSIS — M6281 Muscle weakness (generalized): Secondary | ICD-10-CM | POA: Diagnosis not present

## 2015-04-07 DIAGNOSIS — J441 Chronic obstructive pulmonary disease with (acute) exacerbation: Secondary | ICD-10-CM | POA: Diagnosis not present

## 2015-04-07 DIAGNOSIS — I10 Essential (primary) hypertension: Secondary | ICD-10-CM | POA: Diagnosis not present

## 2015-04-07 DIAGNOSIS — J45909 Unspecified asthma, uncomplicated: Secondary | ICD-10-CM | POA: Diagnosis not present

## 2015-04-09 DIAGNOSIS — J15211 Pneumonia due to Methicillin susceptible Staphylococcus aureus: Secondary | ICD-10-CM | POA: Diagnosis not present

## 2015-04-09 DIAGNOSIS — Z9981 Dependence on supplemental oxygen: Secondary | ICD-10-CM | POA: Diagnosis not present

## 2015-04-09 DIAGNOSIS — I10 Essential (primary) hypertension: Secondary | ICD-10-CM | POA: Diagnosis not present

## 2015-04-09 DIAGNOSIS — I5030 Unspecified diastolic (congestive) heart failure: Secondary | ICD-10-CM | POA: Diagnosis not present

## 2015-04-09 DIAGNOSIS — M6281 Muscle weakness (generalized): Secondary | ICD-10-CM | POA: Diagnosis not present

## 2015-04-09 DIAGNOSIS — J45909 Unspecified asthma, uncomplicated: Secondary | ICD-10-CM | POA: Diagnosis not present

## 2015-04-09 DIAGNOSIS — F419 Anxiety disorder, unspecified: Secondary | ICD-10-CM | POA: Diagnosis not present

## 2015-04-09 DIAGNOSIS — J441 Chronic obstructive pulmonary disease with (acute) exacerbation: Secondary | ICD-10-CM | POA: Diagnosis not present

## 2015-04-09 DIAGNOSIS — G9009 Other idiopathic peripheral autonomic neuropathy: Secondary | ICD-10-CM | POA: Diagnosis not present

## 2015-04-09 DIAGNOSIS — F328 Other depressive episodes: Secondary | ICD-10-CM | POA: Diagnosis not present

## 2015-04-12 DIAGNOSIS — J45909 Unspecified asthma, uncomplicated: Secondary | ICD-10-CM | POA: Diagnosis not present

## 2015-04-12 DIAGNOSIS — G9009 Other idiopathic peripheral autonomic neuropathy: Secondary | ICD-10-CM | POA: Diagnosis not present

## 2015-04-12 DIAGNOSIS — J15211 Pneumonia due to Methicillin susceptible Staphylococcus aureus: Secondary | ICD-10-CM | POA: Diagnosis not present

## 2015-04-12 DIAGNOSIS — Z9981 Dependence on supplemental oxygen: Secondary | ICD-10-CM | POA: Diagnosis not present

## 2015-04-12 DIAGNOSIS — I10 Essential (primary) hypertension: Secondary | ICD-10-CM | POA: Diagnosis not present

## 2015-04-12 DIAGNOSIS — M6281 Muscle weakness (generalized): Secondary | ICD-10-CM | POA: Diagnosis not present

## 2015-04-12 DIAGNOSIS — F419 Anxiety disorder, unspecified: Secondary | ICD-10-CM | POA: Diagnosis not present

## 2015-04-12 DIAGNOSIS — F328 Other depressive episodes: Secondary | ICD-10-CM | POA: Diagnosis not present

## 2015-04-12 DIAGNOSIS — J441 Chronic obstructive pulmonary disease with (acute) exacerbation: Secondary | ICD-10-CM | POA: Diagnosis not present

## 2015-04-12 DIAGNOSIS — I5030 Unspecified diastolic (congestive) heart failure: Secondary | ICD-10-CM | POA: Diagnosis not present

## 2015-04-13 ENCOUNTER — Encounter: Payer: Self-pay | Admitting: Emergency Medicine

## 2015-04-13 ENCOUNTER — Inpatient Hospital Stay
Admission: EM | Admit: 2015-04-13 | Discharge: 2015-04-21 | DRG: 190 | Disposition: A | Discharge status: Skilled Nursing Facility | Payer: Medicare Other | Attending: Internal Medicine | Admitting: Internal Medicine

## 2015-04-13 ENCOUNTER — Emergency Department: Payer: Medicare Other

## 2015-04-13 DIAGNOSIS — Z885 Allergy status to narcotic agent status: Secondary | ICD-10-CM

## 2015-04-13 DIAGNOSIS — E876 Hypokalemia: Secondary | ICD-10-CM | POA: Diagnosis not present

## 2015-04-13 DIAGNOSIS — I700 Atherosclerosis of aorta: Secondary | ICD-10-CM | POA: Diagnosis present

## 2015-04-13 DIAGNOSIS — J9811 Atelectasis: Secondary | ICD-10-CM | POA: Diagnosis present

## 2015-04-13 DIAGNOSIS — A4902 Methicillin resistant Staphylococcus aureus infection, unspecified site: Secondary | ICD-10-CM | POA: Diagnosis not present

## 2015-04-13 DIAGNOSIS — R911 Solitary pulmonary nodule: Secondary | ICD-10-CM | POA: Diagnosis present

## 2015-04-13 DIAGNOSIS — Z7952 Long term (current) use of systemic steroids: Secondary | ICD-10-CM | POA: Diagnosis not present

## 2015-04-13 DIAGNOSIS — Z9981 Dependence on supplemental oxygen: Secondary | ICD-10-CM

## 2015-04-13 DIAGNOSIS — Z87891 Personal history of nicotine dependence: Secondary | ICD-10-CM | POA: Diagnosis not present

## 2015-04-13 DIAGNOSIS — Z720 Tobacco use: Secondary | ICD-10-CM | POA: Diagnosis not present

## 2015-04-13 DIAGNOSIS — G629 Polyneuropathy, unspecified: Secondary | ICD-10-CM | POA: Diagnosis present

## 2015-04-13 DIAGNOSIS — I509 Heart failure, unspecified: Secondary | ICD-10-CM | POA: Diagnosis not present

## 2015-04-13 DIAGNOSIS — I10 Essential (primary) hypertension: Secondary | ICD-10-CM | POA: Diagnosis not present

## 2015-04-13 DIAGNOSIS — Z7951 Long term (current) use of inhaled steroids: Secondary | ICD-10-CM

## 2015-04-13 DIAGNOSIS — F411 Generalized anxiety disorder: Secondary | ICD-10-CM | POA: Diagnosis present

## 2015-04-13 DIAGNOSIS — R0602 Shortness of breath: Secondary | ICD-10-CM | POA: Diagnosis not present

## 2015-04-13 DIAGNOSIS — F329 Major depressive disorder, single episode, unspecified: Secondary | ICD-10-CM | POA: Diagnosis present

## 2015-04-13 DIAGNOSIS — J449 Chronic obstructive pulmonary disease, unspecified: Secondary | ICD-10-CM | POA: Diagnosis not present

## 2015-04-13 DIAGNOSIS — J9601 Acute respiratory failure with hypoxia: Secondary | ICD-10-CM | POA: Diagnosis not present

## 2015-04-13 DIAGNOSIS — Z79899 Other long term (current) drug therapy: Secondary | ICD-10-CM | POA: Diagnosis not present

## 2015-04-13 DIAGNOSIS — J45909 Unspecified asthma, uncomplicated: Secondary | ICD-10-CM | POA: Diagnosis present

## 2015-04-13 DIAGNOSIS — J441 Chronic obstructive pulmonary disease with (acute) exacerbation: Secondary | ICD-10-CM | POA: Diagnosis not present

## 2015-04-13 DIAGNOSIS — I5032 Chronic diastolic (congestive) heart failure: Secondary | ICD-10-CM | POA: Diagnosis not present

## 2015-04-13 DIAGNOSIS — I272 Other secondary pulmonary hypertension: Secondary | ICD-10-CM | POA: Diagnosis present

## 2015-04-13 DIAGNOSIS — J440 Chronic obstructive pulmonary disease with acute lower respiratory infection: Secondary | ICD-10-CM | POA: Diagnosis not present

## 2015-04-13 DIAGNOSIS — Z8614 Personal history of Methicillin resistant Staphylococcus aureus infection: Secondary | ICD-10-CM

## 2015-04-13 DIAGNOSIS — I5033 Acute on chronic diastolic (congestive) heart failure: Secondary | ICD-10-CM | POA: Diagnosis not present

## 2015-04-13 DIAGNOSIS — J9621 Acute and chronic respiratory failure with hypoxia: Secondary | ICD-10-CM | POA: Diagnosis present

## 2015-04-13 DIAGNOSIS — Z791 Long term (current) use of non-steroidal anti-inflammatories (NSAID): Secondary | ICD-10-CM | POA: Diagnosis not present

## 2015-04-13 DIAGNOSIS — J3089 Other allergic rhinitis: Secondary | ICD-10-CM | POA: Diagnosis not present

## 2015-04-13 DIAGNOSIS — F419 Anxiety disorder, unspecified: Secondary | ICD-10-CM | POA: Diagnosis not present

## 2015-04-13 DIAGNOSIS — J439 Emphysema, unspecified: Secondary | ICD-10-CM | POA: Diagnosis not present

## 2015-04-13 DIAGNOSIS — K219 Gastro-esophageal reflux disease without esophagitis: Secondary | ICD-10-CM | POA: Diagnosis not present

## 2015-04-13 DIAGNOSIS — F17200 Nicotine dependence, unspecified, uncomplicated: Secondary | ICD-10-CM | POA: Diagnosis not present

## 2015-04-13 DIAGNOSIS — Z9989 Dependence on other enabling machines and devices: Secondary | ICD-10-CM | POA: Diagnosis not present

## 2015-04-13 DIAGNOSIS — L8990 Pressure ulcer of unspecified site, unspecified stage: Secondary | ICD-10-CM | POA: Insufficient documentation

## 2015-04-13 HISTORY — DX: Tobacco use: Z72.0

## 2015-04-13 HISTORY — DX: Chronic diastolic (congestive) heart failure: I50.32

## 2015-04-13 HISTORY — DX: Chronic respiratory failure, unspecified whether with hypoxia or hypercapnia: J96.10

## 2015-04-13 LAB — CBC
HCT: 42.8 % (ref 40.0–52.0)
Hemoglobin: 13.9 g/dL (ref 13.0–18.0)
MCH: 31.4 pg (ref 26.0–34.0)
MCHC: 32.5 g/dL (ref 32.0–36.0)
MCV: 96.5 fL (ref 80.0–100.0)
Platelets: 177 10*3/uL (ref 150–440)
RBC: 4.43 MIL/uL (ref 4.40–5.90)
RDW: 14.7 % — ABNORMAL HIGH (ref 11.5–14.5)
WBC: 13.5 10*3/uL — AB (ref 3.8–10.6)

## 2015-04-13 LAB — COMPREHENSIVE METABOLIC PANEL
ALBUMIN: 3.9 g/dL (ref 3.5–5.0)
ALK PHOS: 61 U/L (ref 38–126)
ALT: 21 U/L (ref 17–63)
AST: 17 U/L (ref 15–41)
Anion gap: 8 (ref 5–15)
BUN: 16 mg/dL (ref 6–20)
CO2: 28 mmol/L (ref 22–32)
Calcium: 8.4 mg/dL — ABNORMAL LOW (ref 8.9–10.3)
Chloride: 97 mmol/L — ABNORMAL LOW (ref 101–111)
Creatinine, Ser: 1.22 mg/dL (ref 0.61–1.24)
GFR calc Af Amer: >60 mL/min (ref >60)
GFR calc non Af Amer: >60 mL/min (ref >60)
Glucose, Bld: 100 mg/dL — ABNORMAL HIGH (ref 65–99)
Potassium: 5 mmol/L (ref 3.5–5.1)
Sodium: 133 mmol/L — ABNORMAL LOW (ref 135–145)
TOTAL PROTEIN: 6.4 g/dL — AB (ref 6.5–8.1)
Total Bilirubin: 1 mg/dL (ref 0.3–1.2)

## 2015-04-13 LAB — TROPONIN I: Troponin I: <0.03 ng/mL (ref <0.031)

## 2015-04-13 MED ORDER — METHYLPREDNISOLONE SODIUM SUCC 125 MG IJ SOLR
125.0000 mg | Freq: Once | INTRAMUSCULAR | Status: AC
  Administered 2015-04-13: 125 mg via INTRAVENOUS
  Filled 2015-04-13: qty 2

## 2015-04-13 MED ORDER — ACETAMINOPHEN 650 MG RE SUPP: 650.0000 mg | Freq: Four times a day (QID) | RECTAL | Status: DC | PRN

## 2015-04-13 MED ORDER — FUROSEMIDE 10 MG/ML IJ SOLN: 20.0000 mg | Freq: Once | INTRAMUSCULAR | Status: DC

## 2015-04-13 MED ORDER — VANCOMYCIN HCL IN DEXTROSE 1-5 GM/200ML-% IV SOLN: 1000.0000 mg | INTRAVENOUS | Status: DC

## 2015-04-13 MED ORDER — IPRATROPIUM-ALBUTEROL 0.5-2.5 (3) MG/3ML IN SOLN
3.0000 mL | RESPIRATORY_TRACT | Status: DC
  Administered 2015-04-13 – 2015-04-14 (×5): 3 mL via RESPIRATORY_TRACT
  Filled 2015-04-13 (×5): qty 3

## 2015-04-13 MED ORDER — ALPRAZOLAM 0.5 MG PO TABS
0.5000 mg | ORAL_TABLET | Freq: Three times a day (TID) | ORAL | Status: DC | PRN
  Administered 2015-04-13 – 2015-04-16 (×8): 0.5 mg via ORAL
  Administered 2015-04-16: 1 mg via ORAL
  Administered 2015-04-16: 0.5 mg via ORAL
  Administered 2015-04-16 – 2015-04-18 (×5): 1 mg via ORAL
  Administered 2015-04-18: 0.5 mg via ORAL
  Administered 2015-04-18 – 2015-04-21 (×6): 1 mg via ORAL
  Filled 2015-04-13 (×2): qty 2
  Filled 2015-04-13: qty 1
  Filled 2015-04-13: qty 2
  Filled 2015-04-13: qty 1
  Filled 2015-04-13 (×3): qty 2
  Filled 2015-04-13 (×2): qty 1
  Filled 2015-04-13 (×3): qty 2
  Filled 2015-04-13: qty 1
  Filled 2015-04-13: qty 2
  Filled 2015-04-13 (×2): qty 1
  Filled 2015-04-13: qty 2
  Filled 2015-04-13: qty 1
  Filled 2015-04-13: qty 2
  Filled 2015-04-13: qty 1
  Filled 2015-04-13: qty 2

## 2015-04-13 MED ORDER — SODIUM CHLORIDE 0.9 % IV SOLN
250.0000 mL | INTRAVENOUS | Status: DC | PRN
  Filled 2015-04-13: qty 250

## 2015-04-13 MED ORDER — ALBUTEROL SULFATE (2.5 MG/3ML) 0.083% IN NEBU
2.5000 mg | INHALATION_SOLUTION | Freq: Once | RESPIRATORY_TRACT | Status: AC
  Administered 2015-04-13: 2.5 mg via RESPIRATORY_TRACT
  Filled 2015-04-13: qty 3

## 2015-04-13 MED ORDER — ALBUTEROL SULFATE (2.5 MG/3ML) 0.083% IN NEBU: 2.5000 mg | INHALATION_SOLUTION | RESPIRATORY_TRACT | Status: DC | PRN

## 2015-04-13 MED ORDER — ONDANSETRON HCL 4 MG PO TABS: 4.0000 mg | ORAL_TABLET | Freq: Four times a day (QID) | ORAL | Status: DC | PRN

## 2015-04-13 MED ORDER — GUAIFENESIN ER 600 MG PO TB12
600.0000 mg | ORAL_TABLET | Freq: Two times a day (BID) | ORAL | Status: DC
  Administered 2015-04-13 – 2015-04-21 (×16): 600 mg via ORAL
  Filled 2015-04-13 (×16): qty 1

## 2015-04-13 MED ORDER — OXYCODONE HCL 5 MG PO TABS: 5.0000 mg | ORAL_TABLET | ORAL | Status: DC | PRN

## 2015-04-13 MED ORDER — GABAPENTIN 100 MG PO CAPS
200.0000 mg | ORAL_CAPSULE | Freq: Three times a day (TID) | ORAL | Status: DC
  Administered 2015-04-13 – 2015-04-14 (×3): 200 mg via ORAL
  Filled 2015-04-13 (×3): qty 2

## 2015-04-13 MED ORDER — METHYLPREDNISOLONE SODIUM SUCC 125 MG IJ SOLR
60.0000 mg | Freq: Four times a day (QID) | INTRAMUSCULAR | Status: DC
  Administered 2015-04-13 – 2015-04-19 (×24): 60 mg via INTRAVENOUS
  Filled 2015-04-13 (×25): qty 2

## 2015-04-13 MED ORDER — ESCITALOPRAM OXALATE 10 MG PO TABS
20.0000 mg | ORAL_TABLET | Freq: Every day | ORAL | Status: DC
  Administered 2015-04-13 – 2015-04-20 (×8): 20 mg via ORAL
  Filled 2015-04-13 (×10): qty 2

## 2015-04-13 MED ORDER — ACETYLCYSTEINE 10 % IN SOLN
4.0000 mL | Freq: Two times a day (BID) | RESPIRATORY_TRACT | Status: DC
  Filled 2015-04-13 (×4): qty 4

## 2015-04-13 MED ORDER — ACETAMINOPHEN 325 MG PO TABS: 650.0000 mg | ORAL_TABLET | Freq: Four times a day (QID) | ORAL | Status: DC | PRN

## 2015-04-13 MED ORDER — SODIUM CHLORIDE 0.9 % IJ SOLN
3.0000 mL | INTRAMUSCULAR | Status: DC | PRN
  Administered 2015-04-16: 3 mL via INTRAVENOUS
  Filled 2015-04-13: qty 10

## 2015-04-13 MED ORDER — IPRATROPIUM-ALBUTEROL 0.5-2.5 (3) MG/3ML IN SOLN
3.0000 mL | Freq: Once | RESPIRATORY_TRACT | Status: AC
  Administered 2015-04-13: 3 mL via RESPIRATORY_TRACT
  Filled 2015-04-13: qty 3

## 2015-04-13 MED ORDER — SODIUM CHLORIDE 0.9 % IJ SOLN
3.0000 mL | Freq: Two times a day (BID) | INTRAMUSCULAR | Status: DC
  Administered 2015-04-13 – 2015-04-21 (×6): 3 mL via INTRAVENOUS

## 2015-04-13 MED ORDER — THEOPHYLLINE ER 400 MG PO CP24
400.0000 mg | ORAL_CAPSULE | Freq: Every day | ORAL | Status: DC
  Administered 2015-04-13 – 2015-04-21 (×9): 400 mg via ORAL
  Filled 2015-04-13 (×9): qty 1

## 2015-04-13 MED ORDER — DILTIAZEM HCL ER COATED BEADS 120 MG PO CP24
120.0000 mg | ORAL_CAPSULE | Freq: Every day | ORAL | Status: DC
  Administered 2015-04-14 – 2015-04-21 (×8): 120 mg via ORAL
  Filled 2015-04-13 (×8): qty 1

## 2015-04-13 MED ORDER — ENOXAPARIN SODIUM 40 MG/0.4ML ~~LOC~~ SOLN
40.0000 mg | SUBCUTANEOUS | Status: DC
  Administered 2015-04-13 – 2015-04-20 (×8): 40 mg via SUBCUTANEOUS
  Filled 2015-04-13 (×8): qty 0.4

## 2015-04-13 MED ORDER — MOMETASONE FURO-FORMOTEROL FUM 100-5 MCG/ACT IN AERO
2.0000 | INHALATION_SPRAY | Freq: Two times a day (BID) | RESPIRATORY_TRACT | Status: DC
  Administered 2015-04-14 – 2015-04-21 (×15): 2 via RESPIRATORY_TRACT
  Filled 2015-04-13: qty 8.8

## 2015-04-13 MED ORDER — FUROSEMIDE 10 MG/ML IJ SOLN
40.0000 mg | Freq: Two times a day (BID) | INTRAMUSCULAR | Status: DC
  Administered 2015-04-13 – 2015-04-16 (×6): 40 mg via INTRAVENOUS
  Filled 2015-04-13 (×6): qty 4

## 2015-04-13 MED ORDER — NICOTINE 21 MG/24HR TD PT24
21.0000 mg | MEDICATED_PATCH | Freq: Every day | TRANSDERMAL | Status: DC
  Administered 2015-04-13 – 2015-04-21 (×9): 21 mg via TRANSDERMAL
  Filled 2015-04-13 (×9): qty 1

## 2015-04-13 MED ORDER — ONDANSETRON HCL 4 MG/2ML IJ SOLN: 4.0000 mg | Freq: Four times a day (QID) | INTRAMUSCULAR | Status: DC | PRN

## 2015-04-13 MED ORDER — TIOTROPIUM BROMIDE MONOHYDRATE 18 MCG IN CAPS
18.0000 ug | ORAL_CAPSULE | Freq: Every day | RESPIRATORY_TRACT | Status: DC
  Administered 2015-04-13 – 2015-04-21 (×9): 18 ug via RESPIRATORY_TRACT
  Filled 2015-04-13 (×2): qty 5

## 2015-04-13 MED ORDER — VANCOMYCIN HCL 10 G IV SOLR
1250.0000 mg | Freq: Two times a day (BID) | INTRAVENOUS | Status: DC
  Administered 2015-04-14 – 2015-04-21 (×16): 1250 mg via INTRAVENOUS
  Filled 2015-04-13 (×17): qty 1250

## 2015-04-13 MED ORDER — SODIUM CHLORIDE 0.9 % IJ SOLN
3.0000 mL | Freq: Two times a day (BID) | INTRAMUSCULAR | Status: DC
  Administered 2015-04-13 – 2015-04-21 (×15): 3 mL via INTRAVENOUS

## 2015-04-13 MED ORDER — VANCOMYCIN HCL 10 G IV SOLR
1250.0000 mg | INTRAVENOUS | Status: AC
  Administered 2015-04-13: 1250 mg via INTRAVENOUS
  Filled 2015-04-13 (×2): qty 1250

## 2015-04-13 MED ORDER — DOXAZOSIN MESYLATE 4 MG PO TABS
4.0000 mg | ORAL_TABLET | Freq: Every day | ORAL | Status: DC
  Administered 2015-04-14 – 2015-04-21 (×8): 4 mg via ORAL
  Filled 2015-04-13 (×8): qty 1

## 2015-04-13 NOTE — ED Provider Notes (Signed)
[facility] Emergency Department Provider Note  ____________________________________________  Time seen: On arrival  I have reviewed the triage vital signs and the nursing notes.   HISTORY  Chief Complaint Shortness of Breath    [name] is a 59 y.o. male with a history of severe COPD who presents with worsening shortness of breath over the last several days. He reports he was in the hospital 2 weeks ago and had been feeling slightly better although with the recent heat wave his breathing has worsened. He feels dizzy and lightheaded if he walks more than a few steps. He is on 2 L nasal cannula continuous at home. He denies chest pain. No fevers no chills. Occasional cough. No leg swelling. No recent travel.     Past Medical History  Diagnosis Date  . Asthma   . COPD (chronic obstructive pulmonary disease)   . Allergy   . Anxiety   . CHF (congestive heart failure)   . Depression   . Emphysema of lung   . GERD (gastroesophageal reflux disease)     Patient Active Problem List   Diagnosis Date Noted  . COPD exacerbation 03/31/2015    Past Surgical History  Procedure Laterality Date  . Abdominal surgery    . Nose surgery    . Hemorrh      Current Outpatient Rx  Name  Route  Sig  Dispense  Refill  . Albuterol Sulfate (PROAIR RESPICLICK) 108 (90 BASE) MCG/ACT AEPB   Inhalation   Inhale 2 puffs into the lungs every 4 (four) hours as needed (for shortness of breath).         . ALPRAZolam (XANAX) 1 MG tablet   Oral   Take 0.5-1 mg by mouth 3 (three) times daily as needed for anxiety.         Marland Kitchen diltiazem (CARDIZEM) 120 MG tablet   Oral   Take 120 mg by mouth daily.         Marland Kitchen doxazosin (CARDURA) 4 MG tablet   Oral   Take 4 mg by mouth daily.         Marland Kitchen escitalopram (LEXAPRO) 20 MG tablet   Oral   Take 20 mg by mouth daily.         Marland Kitchen gabapentin (NEURONTIN) 100 MG capsule   Oral   Take 200 mg by mouth 3 (three) times  daily.         Marland Kitchen ipratropium-albuterol (DUONEB) 0.5-2.5 (3) MG/3ML SOLN   Nebulization   Take 3 mLs by nebulization 4 (four) times daily as needed (for shortness of breath).         Marland Kitchen levofloxacin (LEVAQUIN) 500 MG tablet   Oral   Take 1 tablet (500 mg total) by mouth daily.   10 tablet   0   . mometasone-formoterol (DULERA) 100-5 MCG/ACT AERO   Inhalation   Inhale 2 puffs into the lungs 2 (two) times daily.   1 Inhaler   2   . predniSONE (DELTASONE) 10 MG tablet      Label  & dispense according to the schedule below. 5 Pills PO for 2 days then, 4 Pills PO for 2 days, 3 Pills PO for 2 days, 2 Pills PO for 2 days, 1 Pill PO for 2 days then STOP.   30 tablet   0   . sulfamethoxazole-trimethoprim (BACTRIM DS,SEPTRA DS) 800-160 MG per tablet   Oral   Take 1 tablet by mouth 2 (two) times daily.  20 tablet   0   . theophylline (THEO-24) 400 MG 24 hr capsule   Oral   Take 400 mg by mouth daily.           Allergies Asa; Codeine; and Ibuprofen  Family History  Problem Relation Age of Onset  . Diabetes Neg Hx     Social History History  Substance Use Topics  . Smoking status: Former Smoker -- 0.25 packs/day for 35 years    Types: Cigarettes  . Smokeless tobacco: Not on file  . Alcohol Use: No    Review of Systems  Constitutional: Negative for fever. Eyes: Negative for visual changes. ENT: Negative for sore throat Cardiovascular: Negative for chest pain. Respiratory: Positive for shortness of breath. Gastrointestinal: Negative for abdominal pain, vomiting and diarrhea. Genitourinary: Negative for dysuria. Musculoskeletal: Negative for back pain. Skin: Negative for rash. Neurological: Negative for headaches or focal weakness Psychiatric no anxiety  10-point ROS otherwise negative.  ____________________________________________   PHYSICAL EXAM:  VITAL SIGNS: ED Triage Vitals  Enc Vitals Group     BP 04/13/15 1122 114/68 mmHg     Pulse Rate  04/13/15 1122 81     Resp 04/13/15 1122 17     Temp 04/13/15 1122 98 F (36.7 C)     Temp Source 04/13/15 1122 Oral     SpO2 04/13/15 1122 96 %     Weight 04/13/15 1122 197 lb (89.359 kg)     Height 04/13/15 1122 5\' 10"  (1.778 m)     Head Cir --      Peak Flow --      Pain Score 04/13/15 1114 0     Pain Loc --      Pain Edu? --      Excl. in GC? --    Constitutional: Alert and oriented.  Eyes: Conjunctivae are normal.  ENT   Head: Normocephalic and atraumatic.   Mouth/Throat: Mucous membranes are moist. Cardiovascular: Normal rate, regular rhythm. Normal and symmetric distal pulses are present in all extremities. No murmurs, rubs, or gallops. Respiratory: Wheezes diffusely. Some accessory muscle use noted Gastrointestinal: Soft and non-tender in all quadrants. No distention. There is no CVA tenderness. Genitourinary: deferred Musculoskeletal: Nontender with normal range of motion in all extremities. No lower extremity tenderness nor edema. Neurologic:  Normal speech and language. No gross focal neurologic deficits are appreciated. Skin:  Skin is warm, dry and intact. No rash noted. Psychiatric: Mood and affect are normal. Patient exhibits appropriate insight and judgment.  ____________________________________________    LABS (pertinent positives/negatives)  Labs Reviewed - No data to display  ____________________________________________   EKG  ED ECG REPORT I, Jene Every, the attending physician, personally viewed and interpreted this ECG.   Date: 04/13/2015  EKG Time: 11:16 AM  Rate: 72  Rhythm: normal sinus rhythm  Axis: Normal  Intervals:Incomplete right bundle branch block  ST&T Change: Nonspecific   ____________________________________________    RADIOLOGY I have personally reviewed any xrays that were ordered on this patient: Chest x-ray is remarkable  ____________________________________________   PROCEDURES  Procedure(s) performed:  none  Critical Care performed:none  ____________________________________________   INITIAL IMPRESSION / ASSESSMENT AND PLAN / ED COURSE  Pertinent labs & imaging results that were available during my care of the patient were reviewed by me and considered in my medical decision making (see chart for details).  Patient given 125 mg Solu-Medrol, 3 nebs for severe wheezing.   Patient reevaluated unable to ambulate without becoming extraordinarily dyspneic and tachypneic.  I was the hospitalist to consider for admission ____________________________________________   FINAL CLINICAL IMPRESSION(S) / ED DIAGNOSES  Final diagnoses:  COPD with acute exacerbation     Jene Every, MD 04/13/15 1538

## 2015-04-13 NOTE — Progress Notes (Addendum)
ANTIBIOTIC CONSULT NOTE - INITIAL  Pharmacy Consult for Vancomycin Indication: pneumonia  Allergies  Allergen Reactions  . Asa [Aspirin] Other (See Comments)    Reaction: swelling of the right side.  . Codeine Other (See Comments)    Reaction: Difficulty breathing  . Ibuprofen Other (See Comments)    Reaction: Swelling of the right side.    Patient Measurements: Height: 5\' 10"  (177.8 cm) Weight: 197 lb (89.359 kg) IBW/kg (Calculated) : 73 Adjusted Body Weight: 79.6 kg  Vital Signs: Temp: 98 F (36.7 C) (07/26 1122) Temp Source: Oral (07/26 1122) BP: 116/66 mmHg (07/26 1524) Pulse Rate: 75 (07/26 1524) Intake/Output from previous day:   Intake/Output from this shift:    Labs:  Recent Labs  04/13/15 1152  WBC 13.5*  HGB 13.9  PLT 177  CREATININE 1.22   Estimated Creatinine Clearance: 71.6 mL/min (by C-G formula based on Cr of 1.22). No results for input(s): VANCOTROUGH, VANCOPEAK, VANCORANDOM, GENTTROUGH, GENTPEAK, GENTRANDOM, TOBRATROUGH, TOBRAPEAK, TOBRARND, AMIKACINPEAK, AMIKACINTROU, AMIKACIN in the last 72 hours.   Microbiology: Recent Results (from the past 720 hour(s))  MRSA PCR Screening     Status: None   Collection Time: 04/01/15  1:20 PM  Result Value Ref Range Status   MRSA by PCR NEGATIVE NEGATIVE Final    Comment:        The GeneXpert MRSA Assay (FDA approved for NASAL specimens only), is one component of a comprehensive MRSA colonization surveillance program. It is not intended to diagnose MRSA infection nor to guide or monitor treatment for MRSA infections.   Culture, expectorated sputum-assessment     Status: None   Collection Time: 04/01/15  2:45 PM  Result Value Ref Range Status   Specimen Description SPUTUM  Final   Special Requests Normal  Final   Sputum evaluation THIS SPECIMEN IS ACCEPTABLE FOR SPUTUM CULTURE  Final   Report Status 04/01/2015 FINAL  Final  Culture, respiratory (NON-Expectorated)     Status: None   Collection  Time: 04/01/15  2:45 PM  Result Value Ref Range Status   Specimen Description SPUTUM  Final   Special Requests Normal Reflexed from [id number]  Final   Gram Stain   Final    MANY WBC SEEN RARE GRAM NEGATIVE RODS RARE GRAM POSITIVE COCCI    Culture   Final    RARE GROWTH METHICILLIN RESISTANT STAPHYLOCOCCUS AUREUS MODERATE GROWTH HAEMOPHILUS INFLUENZAE CRITICAL RESULT CALLED TO, READ BACK BY AND VERIFIED WITH: DR. Cherlynn Kaiser AT 0981 04/04/15 CTJ    Report Status 04/05/2015 FINAL  Final   Organism ID, Bacteria METHICILLIN RESISTANT STAPHYLOCOCCUS AUREUS  Final   Organism ID, Bacteria HAEMOPHILUS INFLUENZAE  Final      Susceptibility   Methicillin resistant staphylococcus aureus - MIC*    CIPROFLOXACIN >=8 RESISTANT Resistant     GENTAMICIN <=0.5 SENSITIVE Sensitive     OXACILLIN Value in next row Resistant      >=4 MODERATELY RESISTANT    VANCOMYCIN <=0.5 SENSITIVE Sensitive     TRIMETH/SULFA <=10 SENSITIVE Sensitive     CEFOXITIN SCREEN Value in next row Resistant      POSITIVECEFOXITIN SCREEN - This test may be used to predict mecA-mediated oxacillin resistance, and it is based on the cefoxitin disk screen test.  The cefoxitin screen and oxacillin work in combination to determine the final interpretation reported for oxacillin.     Inducible Clindamycin Value in next row Sensitive      POSITIVECEFOXITIN SCREEN - This test may be used to predict mecA-mediated  oxacillin resistance, and it is based on the cefoxitin disk screen test.  The cefoxitin screen and oxacillin work in combination to determine the final interpretation reported for oxacillin.     ERYTHROMYCIN Value in next row Resistant      RESISTANT>=8    TETRACYCLINE Value in next row Sensitive      SENSITIVE<=1    CLINDAMYCIN Value in next row Resistant      RESISTANT>=8    LINEZOLID Value in next row Sensitive      SENSITIVE2    LEVOFLOXACIN Value in next row Resistant      RESISTANT>=8    * RARE GROWTH METHICILLIN RESISTANT  STAPHYLOCOCCUS AUREUS    Medical History: Past Medical History  Diagnosis Date  . Asthma   . COPD (chronic obstructive pulmonary disease)   . Allergy   . Anxiety   . CHF (congestive heart failure)   . Depression   . Emphysema of lung   . GERD (gastroesophageal reflux disease)     Medications:   (Not in a hospital admission) Assessment: CrCl = 71.6 ml/min Ke = 0.06 hr-1 T1/2 = 11.5 hrs Vd = 62.6 L  Goal of Therapy:  Vancomycin trough level 10-15 mcg/ml Vancomycin trough level 15-20 mcg/ml  Plan:  Expected duration 7 days with resolution of temperature and/or normalization of WBC   Vancomycin 1250 mg IV 1 STAT given on 7/26 @  21:00. Vancomycin 1250 mg IV Q12H ordered to start 7/27 @ 3:00 ,  ~ 6 hrs after 1st dose (stacked dosing) This pt will reach Css by 7/29 @ 9:00. Will draw 1st trough on 7/29 @ 2:30, which will be very close to Css.   Dustin [name] D 04/13/2015,4:34 PM

## 2015-04-13 NOTE — H&P (Signed)
[facility] [place] [facility]   PATIENT NAME: Dustin [name]    MR#:  [id number]  DATE OF BIRTH:  [birth year]  DATE OF ADMISSION:  04/13/2015  PRIMARY CARE PHYSICIAN: Lyndon Code, MD   REQUESTING/REFERRING PHYSICIAN: Jene Every M.D.  CHIEF COMPLAINT:   Chief Complaint  Patient presents with  . Shortness of Breath    HISTORY OF PRESENT ILLNESS: Dustin [name]  is a 59 y.o. male with a known history of COPD, nicotine addiction, CHF, depression, anxiety and nicotine addiction who was actually hospitalized here on July 13 and was discharged on July 18. At that time patient was noted to have MRSA in his sputum with acute bronchitis he had a CT scan of the chest which failed to show pneumonia. Patient was treated for acute on chronic COPD exasperation and the treatment for MRSA and was discharged on oral anabiotic's with Bactrim and Levaquin. Patient presents to the emergency room complaining of worsening shortness of breath greenish productive sputum. As well as wheezing and progressive swelling of his lower extremity. He denies any fevers or chest pains.  PAST MEDICAL HISTORY:   Past Medical History  Diagnosis Date  . Asthma   . COPD (chronic obstructive pulmonary disease)   . Allergy   . Anxiety   . CHF (congestive heart failure)   . Depression   . Emphysema of lung   . GERD (gastroesophageal reflux disease)     PAST SURGICAL HISTORY:  Past Surgical History  Procedure Laterality Date  . Abdominal surgery    . Nose surgery    . Hemorrh      SOCIAL HISTORY:  History  Substance Use Topics  . Smoking status: Former Smoker -- 0.25 packs/day for 35 years    Types: Cigarettes  . Smokeless tobacco: Not on file  . Alcohol Use: No    FAMILY HISTORY:  Family History  Problem Relation Age of Onset  . Diabetes Neg Hx     DRUG ALLERGIES:  Allergies  Allergen Reactions  . Asa [Aspirin] Other (See Comments)    Reaction: swelling of the right  side.  . Codeine Other (See Comments)    Reaction: Difficulty breathing  . Ibuprofen Other (See Comments)    Reaction: Swelling of the right side.    REVIEW OF SYSTEMS:   CONSTITUTIONAL: No fever, positive fatigue or weakness.  EYES: No blurred or double vision.  EARS, NOSE, AND THROAT: No tinnitus or ear pain.  RESPIRATORY: Greenish productive cough, positive shortness of breath, positive wheezing , no hemoptysis.  CARDIOVASCULAR: No chest pain, orthopnea, edema.  GASTROINTESTINAL: No nausea, vomiting, diarrhea or abdominal pain.  GENITOURINARY: No dysuria, hematuria.  ENDOCRINE: No polyuria, nocturia,  HEMATOLOGY: No anemia, easy bruising or bleeding SKIN: No rash or lesion. MUSCULOSKELETAL: No joint pain or arthritis.   NEUROLOGIC: No tingling, numbness, weakness.  PSYCHIATRY: No anxiety or depression.   MEDICATIONS AT HOME:  Prior to Admission medications   Medication Sig Start Date End Date Taking? Authorizing Provider  Albuterol Sulfate (PROAIR RESPICLICK) 108 (90 BASE) MCG/ACT AEPB Inhale 2 puffs into the lungs every 4 (four) hours as needed (for shortness of breath).   Yes Historical Provider, MD  ALPRAZolam Prudy Feeler) 1 MG tablet Take 0.5-1 mg by mouth 3 (three) times daily as needed for anxiety.   Yes Historical Provider, MD  diltiazem (CARDIZEM) 120 MG tablet Take 120 mg by mouth daily.   Yes Historical Provider, MD  doxazosin (CARDURA) 4 MG tablet Take  4 mg by mouth daily.   Yes Historical Provider, MD  escitalopram (LEXAPRO) 20 MG tablet Take 20 mg by mouth daily.   Yes Historical Provider, MD  gabapentin (NEURONTIN) 100 MG capsule Take 200 mg by mouth 3 (three) times daily.   Yes Historical Provider, MD  ipratropium-albuterol (DUONEB) 0.5-2.5 (3) MG/3ML SOLN Take 3 mLs by nebulization 4 (four) times daily as needed (for shortness of breath).   Yes Historical Provider, MD  levofloxacin (LEVAQUIN) 500 MG tablet Take 1 tablet (500 mg total) by mouth daily. 04/05/15  Yes Houston Siren, MD  mometasone-formoterol (DULERA) 100-5 MCG/ACT AERO Inhale 2 puffs into the lungs 2 (two) times daily. 04/05/15  Yes Houston Siren, MD  predniSONE (DELTASONE) 10 MG tablet Label  & dispense according to the schedule below. 5 Pills PO for 2 days then, 4 Pills PO for 2 days, 3 Pills PO for 2 days, 2 Pills PO for 2 days, 1 Pill PO for 2 days then STOP. 04/05/15  Yes Houston Siren, MD  sulfamethoxazole-trimethoprim (BACTRIM DS,SEPTRA DS) 800-160 MG per tablet Take 1 tablet by mouth 2 (two) times daily. 04/05/15  Yes Houston Siren, MD  theophylline (THEO-24) 400 MG 24 hr capsule Take 400 mg by mouth daily.   Yes Historical Provider, MD      PHYSICAL EXAMINATION:   VITAL SIGNS: Blood pressure 116/66, pulse 75, temperature 98 F (36.7 C), temperature source Oral, resp. rate 19, height 5\' 10"  (1.778 m), weight 89.359 kg (197 lb), SpO2 97 %.  GENERAL:  59 y.o.-year-old patient lying in the bed with mild respiratory distress.  EYES: Pupils equal, round, reactive to light and accommodation. No scleral icterus. Extraocular muscles intact.  HEENT: Head atraumatic, normocephalic. Oropharynx and nasopharynx clear.  NECK:  Supple, no jugular venous distention. No thyroid enlargement, no tenderness.  LUNGS: Bilateral wheezing with some accesory muscle usage  CARDIOVASCULAR: S1, S2 normal. No murmurs, rubs, or gallops.  ABDOMEN: Soft, nontender, nondistended. Bowel sounds present. No organomegaly or mass.  EXTREMITIES: 2+ pedal edema, cyanosis, or clubbing.  NEUROLOGIC: Cranial nerves II through XII are intact. Muscle strength 5/5 in all extremities. Sensation intact. Gait not checked.  PSYCHIATRIC: The patient is alert and oriented x 3.  SKIN: No obvious rash, lesion, or ulcer.   LABORATORY PANEL:   CBC  Recent Labs Lab 04/13/15 1152  WBC 13.5*  HGB 13.9  HCT 42.8  PLT 177  MCV 96.5  MCH 31.4  MCHC 32.5  RDW 14.7*    ------------------------------------------------------------------------------------------------------------------  Chemistries   Recent Labs Lab 04/13/15 1152  NA 133*  K 5.0  CL 97*  CO2 28  GLUCOSE 100*  BUN 16  CREATININE 1.22  CALCIUM 8.4*  AST 17  ALT 21  ALKPHOS 61  BILITOT 1.0   ------------------------------------------------------------------------------------------------------------------ estimated creatinine clearance is 71.6 mL/min (by C-G formula based on Cr of 1.22). ------------------------------------------------------------------------------------------------------------------ No results for input(s): TSH, T4TOTAL, T3FREE, THYROIDAB in the last 72 hours.  Invalid input(s): FREET3   Coagulation profile No results for input(s): INR, PROTIME in the last 168 hours. ------------------------------------------------------------------------------------------------------------------- No results for input(s): DDIMER in the last 72 hours. -------------------------------------------------------------------------------------------------------------------  Cardiac Enzymes  Recent Labs Lab 04/13/15 1152  TROPONINI <0.03   ------------------------------------------------------------------------------------------------------------------ Invalid input(s): POCBNP  ---------------------------------------------------------------------------------------------------------------  Urinalysis    Component Value Date/Time   COLORURINE Yellow 07/23/2014 2110   APPEARANCEUR Hazy 07/23/2014 2110   LABSPEC 1.027 07/23/2014 2110   PHURINE 5.0 07/23/2014 2110   GLUCOSEU Negative 07/23/2014 2110  HGBUR Negative 07/23/2014 2110   BILIRUBINUR Negative 07/23/2014 2110   KETONESUR Negative 07/23/2014 2110   PROTEINUR Negative 07/23/2014 2110   NITRITE Negative 07/23/2014 2110   LEUKOCYTESUR Negative 07/23/2014 2110     RADIOLOGY: Dg Chest Portable 1 View  04/13/2015    CLINICAL DATA:  Altered mental status and hypotension today.  EXAM: PORTABLE CHEST - 1 VIEW  COMPARISON:  04/02/2015  FINDINGS: Cardiomegaly and COPD/ emphysema changes again noted.  Mild bibasilar atelectasis/scarring again noted.  There is no evidence of focal airspace disease, pulmonary edema, suspicious pulmonary nodule/mass, pleural effusion, or pneumothorax.  No acute bony abnormalities are identified.  IMPRESSION: No significant change from the prior study. Unchanged mild bibasilar atelectasis/scarring.  Cardiomegaly and COPD/emphysema.   Electronically Signed   By: Harmon Pier M.D.   On: 04/13/2015 12:20    EKG: Orders placed or performed during the hospital encounter of 04/13/15  . EKG 12-Lead  . EKG 12-Lead    IMPRESSION AND PLAN:  patient is a 59 year old white male with history of chronic respiratory failure on 2 L of oxygen presents with progressive shortness of breath cough  1. Acute on chronic COPD exasperation: At this time will go ahead and treat the patient with nebulizers and IV Solu-Medrol, IV anabiotic's with vancomycin due to MRSA in the sputum. I will continue his inhalers at [facility] to his current regimen continue theophylline I will ask his pulmonologist to come evaluate the patient. Based on his CT scan done recently he does have evidence of severe COPD. He will need outpatient PFTs in the near future once he is improved  2. Lower extremity swelling; suspect due to right sided acute heart failure I will treat him with IV Lasix if renal function worsens I will hold his Lasix.  3. Generalized anxiety disorder continue alprazolam as needed  4. Nicotine addiction again smoking cessation done recommend that he stop smoking 4 minutes spent to be started on a nicotine patch  5. Hypertension continue Cardizem    All the records are reviewed and case discussed with ED provider. Management plans discussed with the patient, family and they are in agreement.  CODE STATUS:  Full    TOTAL TIME TAKING CARE OF THIS PATIENT: 55 minutes .    Auburn Bilberry M.D on 04/13/2015 at 4:14 PM  Between 7am to 6pm - Pager - [phone]  After 6pm go to [email or web address] - password [email or web address] [facility]  [facility]  Office  [phone]  CC: Primary care physician; Lyndon Code, MD

## 2015-04-13 NOTE — ED Notes (Signed)
Pt was seen at PCP for follow up today from recent admission for COPD.  EMS was called because pt's BP was noted to be low and PCP stated pt had AMS. Pt alert and oriented upon arrival to ER and states that his SOB has gotten worse over last few days.

## 2015-04-14 LAB — CBC
HEMATOCRIT: 44.1 % (ref 40.0–52.0)
HEMOGLOBIN: 14.3 g/dL (ref 13.0–18.0)
MCH: 31.1 pg (ref 26.0–34.0)
MCHC: 32.4 g/dL (ref 32.0–36.0)
MCV: 95.9 fL (ref 80.0–100.0)
PLATELETS: 186 10*3/uL (ref 150–440)
RBC: 4.6 MIL/uL (ref 4.40–5.90)
RDW: 14.9 % — ABNORMAL HIGH (ref 11.5–14.5)
WBC: 12.4 10*3/uL — ABNORMAL HIGH (ref 3.8–10.6)

## 2015-04-14 LAB — EXPECTORATED SPUTUM ASSESSMENT W GRAM STAIN, RFLX TO RESP C

## 2015-04-14 LAB — BASIC METABOLIC PANEL
ANION GAP: 9 (ref 5–15)
BUN: 24 mg/dL — AB (ref 6–20)
CALCIUM: 8.9 mg/dL (ref 8.9–10.3)
CHLORIDE: 95 mmol/L — AB (ref 101–111)
CO2: 36 mmol/L — ABNORMAL HIGH (ref 22–32)
CREATININE: 1.02 mg/dL (ref 0.61–1.24)
GFR calc Af Amer: >60 mL/min (ref >60)
GFR calc non Af Amer: >60 mL/min (ref >60)
GLUCOSE: 150 mg/dL — AB (ref 65–99)
POTASSIUM: 4.3 mmol/L (ref 3.5–5.1)
Sodium: 140 mmol/L (ref 135–145)

## 2015-04-14 LAB — MAGNESIUM: Magnesium: 2.2 mg/dL (ref 1.7–2.4)

## 2015-04-14 LAB — EXPECTORATED SPUTUM ASSESSMENT W REFEX TO RESP CULTURE

## 2015-04-14 MED ORDER — AZITHROMYCIN 250 MG PO TABS
500.0000 mg | ORAL_TABLET | Freq: Every day | ORAL | Status: DC
  Administered 2015-04-14 – 2015-04-21 (×8): 500 mg via ORAL
  Filled 2015-04-14 (×8): qty 2

## 2015-04-14 MED ORDER — GABAPENTIN 400 MG PO CAPS
400.0000 mg | ORAL_CAPSULE | Freq: Three times a day (TID) | ORAL | Status: DC
  Administered 2015-04-14 – 2015-04-20 (×17): 400 mg via ORAL
  Filled 2015-04-14 (×19): qty 1

## 2015-04-14 MED ORDER — ALBUTEROL SULFATE (2.5 MG/3ML) 0.083% IN NEBU
2.5000 mg | INHALATION_SOLUTION | RESPIRATORY_TRACT | Status: DC
  Administered 2015-04-14 – 2015-04-21 (×39): 2.5 mg via RESPIRATORY_TRACT
  Filled 2015-04-14 (×41): qty 3

## 2015-04-14 MED ORDER — RISAQUAD PO CAPS
1.0000 | ORAL_CAPSULE | Freq: Every day | ORAL | Status: DC
  Administered 2015-04-14 – 2015-04-21 (×8): 1 via ORAL
  Filled 2015-04-14 (×8): qty 1

## 2015-04-14 NOTE — Consult Note (Signed)
Date: 04/14/2015,   MRN# [id number] ERVAN HEBER Dec 05, 1955 Code Status:     Code Status Orders        Start     Ordered   04/13/15 1723  Full code   Continuous     04/13/15 1722          AdmissionWeight: 197 lb (89.359 kg)                 CurrentWeight: 197 lb (89.359 kg)     CHIEF COMPLAINT:   Acute SOB   HISTORY OF PRESENT ILLNESS  Dustin [name] is a 59 y.o. male with a known history of COPD, nicotine addiction, CHF, depression, anxiety, who was hospitalized here on July 13 and was discharged on July 18. At that time patient was noted to have MRSA in his sputum with acute bronchitis he had a CT scan of the chest which failed to show pneumonia. Patient was treated for acute on chronic COPD exasperation and the treatment for MRSA and was discharged on oral anabiotic's with Bactrim and Levaquin. Patient presents to the emergency room complaining of worsening shortness of breath with productive cough greenish productive sputum.  He denies any fevers or chest pains. His oxygen requirements increased to 5 l [place],   Patient Seen for Dr Welton Flakes  PAST MEDICAL HISTORY   Past Medical History  Diagnosis Date  . Asthma   . COPD (chronic obstructive pulmonary disease)   . Allergy   . Anxiety   . CHF (congestive heart failure)   . Depression   . Emphysema of lung   . GERD (gastroesophageal reflux disease)      SURGICAL HISTORY   Past Surgical History  Procedure Laterality Date  . Abdominal surgery    . Nose surgery    . Hemorrh       FAMILY HISTORY   Family History  Problem Relation Age of Onset  . Diabetes Neg Hx      SOCIAL HISTORY   History  Substance Use Topics  . Smoking status: Former Smoker -- 0.25 packs/day for 35 years    Types: Cigarettes  . Smokeless tobacco: Not on file  . Alcohol Use: No     MEDICATIONS    Home Medication:  No current outpatient prescriptions on file.  Current Medication:  Current facility-administered medications:   .  0.9 %  sodium chloride infusion, 250 mL, Intravenous, PRN, Auburn Bilberry, MD .  acetaminophen (TYLENOL) tablet 650 mg, 650 mg, Oral, Q6H PRN **OR** acetaminophen (TYLENOL) suppository 650 mg, 650 mg, Rectal, Q6H PRN, Auburn Bilberry, MD .  albuterol (PROVENTIL) (2.5 MG/3ML) 0.083% nebulizer solution 2.5 mg, 2.5 mg, Inhalation, Q4H PRN, Auburn Bilberry, MD .  albuterol (PROVENTIL) (2.5 MG/3ML) 0.083% nebulizer solution 2.5 mg, 2.5 mg, Nebulization, Q4H, Erin Fulling, MD .  ALPRAZolam Prudy Feeler) tablet 0.5-1 mg, 0.5-1 mg, Oral, TID PRN, Auburn Bilberry, MD, 0.5 mg at 04/14/15 1018 .  azithromycin (ZITHROMAX) tablet 500 mg, 500 mg, Oral, Daily, Vipul Shah, MD .  diltiazem (CARDIZEM CD) 24 hr capsule 120 mg, 120 mg, Oral, Daily, Auburn Bilberry, MD, 120 mg at 04/14/15 0956 .  doxazosin (CARDURA) tablet 4 mg, 4 mg, Oral, Daily, Auburn Bilberry, MD, 4 mg at 04/14/15 0955 .  enoxaparin (LOVENOX) injection 40 mg, 40 mg, Subcutaneous, Q24H, Auburn Bilberry, MD, 40 mg at 04/13/15 2105 .  escitalopram (LEXAPRO) tablet 20 mg, 20 mg, Oral, Daily, Auburn Bilberry, MD, 20 mg at 04/13/15 2105 .  furosemide (LASIX) injection 40 mg,  40 mg, Intravenous, Q12H, Auburn Bilberry, MD, 40 mg at 04/14/15 0321 .  gabapentin (NEURONTIN) capsule 200 mg, 200 mg, Oral, TID, Auburn Bilberry, MD, 200 mg at 04/14/15 0955 .  guaiFENesin (MUCINEX) 12 hr tablet 600 mg, 600 mg, Oral, BID, Auburn Bilberry, MD, 600 mg at 04/14/15 0955 .  methylPREDNISolone sodium succinate (SOLU-MEDROL) 125 mg/2 mL injection 60 mg, 60 mg, Intravenous, Q6H, Auburn Bilberry, MD, 60 mg at 04/14/15 0955 .  mometasone-formoterol (DULERA) 100-5 MCG/ACT inhaler 2 puff, 2 puff, Inhalation, BID, Auburn Bilberry, MD, 2 puff at 04/14/15 0954 .  nicotine (NICODERM CQ - dosed in mg/24 hours) patch 21 mg, 21 mg, Transdermal, Daily, Auburn Bilberry, MD, 21 mg at 04/14/15 0953 .  ondansetron (ZOFRAN) tablet 4 mg, 4 mg, Oral, Q6H PRN **OR** ondansetron (ZOFRAN) injection 4 mg, 4 mg,  Intravenous, Q6H PRN, Auburn Bilberry, MD .  oxyCODONE (Oxy IR/ROXICODONE) immediate release tablet 5 mg, 5 mg, Oral, Q4H PRN, Auburn Bilberry, MD .  sodium chloride 0.9 % injection 3 mL, 3 mL, Intravenous, Q12H, Auburn Bilberry, MD, 3 mL at 04/14/15 0958 .  sodium chloride 0.9 % injection 3 mL, 3 mL, Intravenous, Q12H, Auburn Bilberry, MD, 3 mL at 04/14/15 0957 .  sodium chloride 0.9 % injection 3 mL, 3 mL, Intravenous, PRN, Auburn Bilberry, MD .  theophylline (THEO-24) 24 hr capsule 400 mg, 400 mg, Oral, Daily, Auburn Bilberry, MD, 400 mg at 04/14/15 0956 .  tiotropium (SPIRIVA) inhalation capsule 18 mcg, 18 mcg, Inhalation, Daily, Auburn Bilberry, MD, 18 mcg at 04/14/15 0953 .  vancomycin (VANCOCIN) 1,250 mg in sodium chloride 0.9 % 250 mL IVPB, 1,250 mg, Intravenous, Q12H, Auburn Bilberry, MD, 1,250 mg at 04/14/15 1524    ALLERGIES   Asa; Codeine; and Ibuprofen     REVIEW OF SYSTEMS   Review of Systems  Constitutional: Positive for fever, chills and malaise/fatigue. Negative for weight loss.  HENT: Negative for hearing loss and tinnitus.   Eyes: Negative for blurred vision, double vision and photophobia.  Respiratory: Positive for cough, sputum production, shortness of breath and wheezing.   Cardiovascular: Negative for chest pain, palpitations and orthopnea.  Gastrointestinal: Negative for heartburn, nausea and vomiting.  Genitourinary: Negative for dysuria, urgency and frequency.  Musculoskeletal: Negative for myalgias, back pain and neck pain.  Skin: Negative for itching and rash.  Neurological: Positive for weakness. Negative for dizziness, tingling, tremors and headaches.  Endo/Heme/Allergies: Negative for environmental allergies. Does not bruise/bleed easily.  Psychiatric/Behavioral: Negative for depression and suicidal ideas.     VS: BP 114/50 mmHg  Pulse 101  Temp(Src) 97.6 F (36.4 C) (Oral)  Resp 20  Ht  (1.778 m)  Wt 197 lb (89.359 kg)  BMI 28.27 kg/m2  SpO2  90%     PHYSICAL EXAM  Physical Exam  Constitutional: He is oriented to person, place, and time. He appears well-developed and well-nourished. He appears distressed.  HENT:  Head: Normocephalic and atraumatic.  Mouth/Throat: Oropharynx is clear and moist. No oropharyngeal exudate.  Eyes: Conjunctivae and EOM are normal. Pupils are equal, round, and reactive to light. No scleral icterus.  Neck: Normal range of motion. Neck supple.  Cardiovascular: Normal rate, regular rhythm and normal heart sounds.   No murmur heard. Pulmonary/Chest: He has wheezes. He has rales.  Abdominal: Soft. Bowel sounds are normal. He exhibits no distension. There is no tenderness. There is no rebound.  Musculoskeletal: Normal range of motion. He exhibits no edema.  Neurological: He is alert and oriented to  person, place, and time. He displays normal reflexes. No cranial nerve deficit.  Skin: Skin is warm and dry. No rash noted. No erythema.        LABS    Recent Labs     04/13/15  1152  04/14/15  0540  HGB  13.9  14.3  HCT  42.8  44.1  MCV  96.5  95.9  WBC  13.5*  12.4*  BUN  16  24*  CREATININE  1.22  1.02  GLUCOSE  100*  150*  CALCIUM  8.4*  8.9  ,    No results for input(s): PH in the last 72 hours.  Invalid input(s): PCO2, PO2, BASEEXCESS, BASEDEFICITE, TFT    CULTURE RESULTS   Recent Results (from the past 240 hour(s))  Culture, expectorated sputum-assessment     Status: None   Collection Time: 04/14/15  2:00 PM  Result Value Ref Range Status   Specimen Description SPUTUM  Final   Special Requests NONE  Final   Sputum evaluation THIS SPECIMEN IS ACCEPTABLE FOR SPUTUM CULTURE  Final   Report Status 04/14/2015 FINAL  Final          IMAGING    No results found.       ASSESSMENT/PLAN   59 yo white male with acute COPD exacerbation likely from bronchitis   1.oxygen as needed keep o2 sats 88-92% 2.albuterol nebs every 4 hrs 3.pulmicort nebs 4.spiriva and  dulera 5.continue iv abx as prescribed 6.waiting for sputum cultures   I have personally obtained a history, examined the patient, evaluated laboratory and independently reviewed imaging results, formulated the assessment and plan and placed orders.  The Patient requires high complexity decision making for assessment and support, frequent evaluation and titration of therapies, application of advanced monitoring technologies and extensive interpretation of multiple databases. Time spent with patient 45 minutes.  Patient is satisfied with Plan of action and management.    Lucie Leather, M.D.  [facility]  Medical Director [facility] Medical Director [name] Cardio-Pulmonary Department

## 2015-04-14 NOTE — Clinical Social Work Note (Signed)
Clinical Social Work Assessment  Patient Details  Name: Dustin [name] MRN: [id number] Date of Birth: [birth year]  Date of referral:  04/14/15               Reason for consult:  Family Concerns, [employer]                Permission sought to share information with:  Case Optician, dispensing granted to share information::  Yes, Verbal Permission Granted  Name::      Insurance account manager::   [place]  Relationship::   RN Case Primary school teacher Information:     Housing/Transportation Living arrangements for the past 2 months:  Single Family Home Source of Information:  Patient Patient Interpreter Needed:  None Criminal Activity/Legal Involvement Pertinent to Current Situation/Hospitalization:  No - Comment as needed Significant Relationships:  Adult Children, Spouse Lives with:  Adult Children, Spouse, Other (Comment) (grandchildren) Do you feel safe going back to the place where you live?    Need for family participation in patient care:  No (Coment)  Care giving concerns:  Patient lives with his wife Dustin [name] in [place].    Social Worker assessment / plan: Holiday representative (Gonzales) received a Gaffer consult from RN in progression that patient has some social and family concerns. CSW met with patient to address consult. Patient was alone at bedside. CSW introduced self and explained role of CSW department. Patient was alert and oriented and answered questions appropriately. Patient reported that he has been disabled for the past 16 years and not able to work. Patient's wife Dustin [name] of 18 years works at [employer]. Patient and wife have 7 children and 6 grandchildren. Patient's daughter and 4 grandchildren also live with him and his wife. Patient reported that his daughter recently got a new job and will start work soon. Patient reported that sometimes food is scarce. Per patient his daughter previously received food stamps but does not anymore. Patient reported that his daughter does  receive WIC. Patient reported that he went to [facility] last year for IV Antibiotic therapy and had the same problem he is having now. Patient reported that he was recently in the ED and was D/C'ed home on 21 days of antibiotics that did not help. Patient reported that he has a hard time paying for medications as well.   CSW provided emotional support. CSW made RN Case Manager aware that patient needs assistance with medications. CSW also discussed [employer], which provides emergency shelter, daily meals, and has a Building surveyor. Patient reported that he has been to [employer] before for food and did not feel welcome. Patient reported that he felt like he lost his dignity and the intake worker was rude to him. CSW continued to provide emotional support throughout assessment.   Employment status:  Disabled (Comment on whether or not currently receiving Disability) Insurance information:  Managed Medicare PT Recommendations:  Not assessed at this time Information / Referral to community resources:  Other (Comment Required) (community resources)  Patient/Family's Response to care: Patient is agreeable to returning home at D/C.   Patient/Family's Understanding of and Emotional Response to Diagnosis, Current Treatment, and Prognosis: Patient was pleasant throughout assessment.   Emotional Assessment Appearance:  Appears stated age Attitude/Demeanor/Rapport:    Affect (typically observed):  Pleasant, Accepting Orientation:  Oriented to Self, Oriented to Place, Oriented to  Time, Oriented to Situation Alcohol / Substance use:  Not Applicable Psych involvement (Current and /or in the community):  No (Comment)  Discharge Needs  Concerns to be addressed:  Medication Concerns, Discharge Planning Concerns Readmission within the last 30 days:  No Current discharge risk:  Inadequate Financial Supports Barriers to Discharge:  Continued Medical Work up   Elwyn Reach 04/14/2015, 10:40 AM

## 2015-04-14 NOTE — Care Management Note (Addendum)
Case Management Note  Patient Details  Name: Dustin [name] MRN: [id number] Date of Birth: 05/07/1956  Subjective/Objective:                  Met with patient and her daughter to discuss discharge planning. Patient states he lives with his wife, adult daughter and her three minor children. Daughter and wife work; patient draws Fish farm manager. His PCP is Dr. Clayborn Bigness. He is chronic O2 with [facility]. He is open to [facility] PT/RN (resumption orders needed). He states he is able to drive himself to appointments. He states he is behind one Warehouse manager payment (with [facility]). He has not talked to anyone with DSS regarding potential for his electricity being cut off. He has health insurance and is NOT in donut hole and has Medicare which limits resources for Rx.  Action/Plan: Placed in [facility] program; information faxed to Medication Management, and will research [employer] funds after patient brings in household income and Warehouse manager. RNCM will continue to follow. [facility] aware of patient re-admission.  Expected Discharge Date:                  Expected Discharge Plan:     In-House Referral:  Clinical Social Work  Discharge planning Services  CM Consult  Post Acute Care Choice:  Home Health Choice offered to:  Patient, Adult Children  DME Arranged:    DME Agency:     HH Arranged:    [place] Agency:  [place]  Status of Service:  In process, will continue to follow  Medicare Important Message Given:    Date Medicare IM Given:    Medicare IM give by:    Date Additional Medicare IM Given:    Additional Medicare Important Message give by:     If discussed at [place] of Stay Meetings, dates discussed:    Additional Comments:  Marshell Garfinkel, RN 04/14/2015, 1:59 PM

## 2015-04-14 NOTE — Progress Notes (Addendum)
[facility]   PATIENT NAME: Dustin [name]    MR#:  [id number]  DATE OF BIRTH:  [birth year]  SUBJECTIVE:  CHIEF COMPLAINT:   Chief Complaint  Patient presents with  . Shortness of Breath  Pt. here due to shortness of breath from COPD Exacerbation.  Still does not feel like he is back to his baseline.  + cough w/ green-yellow sputum.  O2 requirements at 5L now. Feels some extremity cramps and pain    REVIEW OF SYSTEMS:   Review of Systems  Constitutional: Negative for fever and chills.  HENT: Negative for congestion and tinnitus.   Eyes: Negative for blurred vision and double vision.  Respiratory: Positive for cough, sputum production and shortness of breath. Negative for wheezing.   Cardiovascular: Negative for chest pain, orthopnea and PND.  Gastrointestinal: Negative for nausea, vomiting, abdominal pain and diarrhea.  Genitourinary: Negative for dysuria and hematuria.  Neurological: Negative for dizziness, sensory change and focal weakness.  All other systems reviewed and are negative.  Nutrition: Heart Healthy Tolerating Diet: Yes DRUG ALLERGIES:   Allergies  Allergen Reactions  . Asa [Aspirin] Other (See Comments)    Reaction: swelling of the right side.  . Codeine Other (See Comments)    Reaction: Difficulty breathing  . Ibuprofen Other (See Comments)    Reaction: Swelling of the right side.   VITALS:  Blood pressure 114/50, pulse 101, temperature 97.6 F (36.4 C), temperature source Oral, resp. rate 20, height  (1.778 m), weight 89.359 kg (197 lb), SpO2 90 %. PHYSICAL EXAMINATION:  Physical Exam  GENERAL:  59 y.o.-year-old patient lying in the bed in NAD.  EYES: Pupils equal, round, reactive to light and accommodation. No scleral icterus. Extraocular muscles intact.  HEENT: Head atraumatic, normocephalic. Oropharynx and nasopharynx clear.  NECK:  Supple, no jugular venous distention. No thyroid enlargement, no  tenderness.  LUNGS: Prolonged inspiratory & Exp. Phase. Has end-exp. wheezing. No use of accessory muscles of respiration. No dullness to percussion.  CARDIOVASCULAR: S1, S2 normal. No murmurs, rubs, or gallops.  ABDOMEN: Soft, nontender, nondistended. Bowel sounds present. No organomegaly or mass.  EXTREMITIES: No cyanosis, clubbing or edema b/l.    NEUROLOGIC: Cranial nerves II through XII are intact. No focal Motor or sensory deficits b/l.   PSYCHIATRIC: The patient is alert and oriented x 3. Good Affect.  SKIN: No obvious rash, lesion, or ulcer.  LABORATORY PANEL:   CBC  Recent Labs Lab 04/14/15 0540  WBC 12.4*  HGB 14.3  HCT 44.1  PLT 186   ------------------------------------------------------------------------------------------------------------------  Chemistries   Recent Labs Lab 04/13/15 1152 04/14/15 0540 04/14/15 1430  NA 133* 140  --   K 5.0 4.3  --   CL 97* 95*  --   CO2 28 36*  --   GLUCOSE 100* 150*  --   BUN 16 24*  --   CREATININE 1.22 1.02  --   CALCIUM 8.4* 8.9  --   MG  --   --  2.2  AST 17  --   --   ALT 21  --   --   ALKPHOS 61  --   --   BILITOT 1.0  --   --    ASSESSMENT AND PLAN:   59 year old male with past medical history of COPD, anxiety, CHF, depression, GERD who presented to the hospital due to shortness of breath and noted to be in acute on chronic respiratory failure due to COPD  exacerbation.  #1 Acute on chronic respiratory failure due to COPD exacerbation-continue IV steroids, DuoNeb nebs around-the-clock,Spiriva, Pulmocort nebs, theophylline.  -Patient is on oxygen at home at 2 L. - O2 has been weaned down to 5 L Today.   - started on Vanco for MRSA in the sputum and will add zithromax for atypical coverage - appreciate pulmo input - if no improvement - may need deep culture or bronch to r/o any other etiology - he reports heat making his breathing worse and normally he feels hot even though it's cold in house.  #2 COPD  exacerbation- continue Solumedrol, duonebs, Theophylline, Spiriva.  -  Slow to improve. - already on O2 at home. Repeat Sputum Cx pending.  Does have hx of MRSA in sputum from last admission - cont. IV Vanco, Zithromax for now.   - follows w/ Dr. Freda Munro as outpatient - Dr Belia Heman seen while here.  #3 Pulmonary nodule- recommended CT scan of the chest in 6 months to ensure stability.  #4 History of CHF- clinically not in CHF, has some Lower extremity swelling which is likely due to right sided acute heart failure I will treat him with IV Lasix 40 mg BID but if renal function worsens - will hold his Lasix.  #5 Hypertension essential - continue Cardizem  #6 Depression/Anxiety-continue low-dose Lexapro, also on alprazolam  #7 generalized Weakness- seen by physical therapy and recommended home health services.    #8 Nicotine addiction again smoking cessation done by admitting Dr - recommend that he stop smoking 4 minutes spent to be started on a nicotine patch    #9 Neuropathy: likely causing him to have pain/cramps in extremities, he is taking 400 mg (2 tabs of 200 mg) TID of gabapentin at home and is on 200 mg TID here, so will adjust dose accordingly. D/w pharmacy.   All the records are reviewed and case discussed with Care Management/Social Worker. Management plans discussed with the patient, family and they are in agreement.  CODE STATUS: Full  DVT Prophylaxis: Heparin subcutaneous  TOTAL TIME TAKING CARE OF THIS PATIENT: 25 minutes.   POSSIBLE D/C tomorrow a.m., DEPENDING ON CLINICAL CONDITION.   [facility], Dequarius Jeffries M.D on 04/14/2015 at 4:17 PM  Between 7am to 6pm - Pager - [phone]  After 6pm go to [email or web address] - password [email or web address] [facility]  [facility]  Office  [phone]  CC: Primary care physician; Lyndon Code, MD

## 2015-04-14 NOTE — Progress Notes (Signed)
Initial Nutrition Assessment  DOCUMENTATION CODES:      INTERVENTION:     NUTRITION DIAGNOSIS:   Unintentional weight loss related to acute illness as evidenced by percent weight loss.    GOAL:   Patient will meet greater than or equal to 90% of their needs    MONITOR:    (Energy intake, Anthropometric)  REASON FOR ASSESSMENT:   Malnutrition Screening Tool    ASSESSMENT:      Past Medical History  Diagnosis Date  . Asthma   . COPD (chronic obstructive pulmonary disease)   . Allergy   . Anxiety   . CHF (congestive heart failure)   . Depression   . Emphysema of lung   . GERD (gastroesophageal reflux disease)     Current Nutrition: ate 100% of lunch today, tray observed  Food/Nutrition-Related History: pt reports normal intake prior to admission   Medications: lasix, solumedrol  Electrolyte/Renal Profile and Glucose Profile:   Recent Labs Lab 04/13/15 1152 04/14/15 0540  NA 133* 140  K 5.0 4.3  CL 97* 95*  CO2 28 36*  BUN 16 24*  CREATININE 1.22 1.02  CALCIUM 8.4* 8.9  GLUCOSE 100* 150*   Protein Profile:  Recent Labs Lab 04/13/15 1152  ALBUMIN 3.9    Gastrointestinal Profile: Last BM: 7/26   Nutrition-Focused Physical Exam Findings: Nutrition-Focused physical exam completed. Findings are WDL for fat depletion, muscle depletion, and edema.    Weight Change: 2% weight loss in the last month noted per wt encounters   Diet Order:  Diet 2 gram sodium Room service appropriate?: Yes; Fluid consistency:: Thin      Height:   Ht Readings from Last 1 Encounters:  04/13/15  (1.778 m)    Weight:   Wt Readings from Last 1 Encounters:  04/13/15 197 lb (89.359 kg)       BMI:  Body mass index is 28.27 kg/(m^2).   EDUCATION NEEDS:   No education needs identified at this time  LOW Care Level  Franchon Ketterman B. Freida Busman, RD, LDN [phone] (pager)

## 2015-04-15 LAB — BASIC METABOLIC PANEL
ANION GAP: 8 (ref 5–15)
BUN: 30 mg/dL — ABNORMAL HIGH (ref 6–20)
CO2: 33 mmol/L — ABNORMAL HIGH (ref 22–32)
CREATININE: 1.14 mg/dL (ref 0.61–1.24)
Calcium: 9 mg/dL (ref 8.9–10.3)
Chloride: 98 mmol/L — ABNORMAL LOW (ref 101–111)
Glucose, Bld: 160 mg/dL — ABNORMAL HIGH (ref 65–99)
Potassium: 4 mmol/L (ref 3.5–5.1)
Sodium: 139 mmol/L (ref 135–145)

## 2015-04-15 LAB — CBC
HEMATOCRIT: 42.1 % (ref 40.0–52.0)
HEMOGLOBIN: 13.7 g/dL (ref 13.0–18.0)
MCH: 31.3 pg (ref 26.0–34.0)
MCHC: 32.6 g/dL (ref 32.0–36.0)
MCV: 95.8 fL (ref 80.0–100.0)
Platelets: 188 10*3/uL (ref 150–440)
RBC: 4.39 MIL/uL — ABNORMAL LOW (ref 4.40–5.90)
RDW: 14.6 % — ABNORMAL HIGH (ref 11.5–14.5)
WBC: 19.3 10*3/uL — AB (ref 3.8–10.6)

## 2015-04-15 NOTE — Progress Notes (Addendum)
[facility] [place] at [facility]   PATIENT NAME: Dustin [name]    MR#:  [id number]  DATE OF BIRTH:  [birth year]  SUBJECTIVE:  CHIEF COMPLAINT:   Chief Complaint  Patient presents with  . Shortness of Breath  Pt. here due to shortness of breath from COPD Exacerbation.  Still does not feel like he is back to his baseline.  + cough w/ green-yellow sputum.  O2 requirements at 5L now. extremity cramps and pain much better REVIEW OF SYSTEMS:   Review of Systems  Constitutional: Negative for fever and chills.  HENT: Negative for congestion and tinnitus.   Eyes: Negative for blurred vision and double vision.  Respiratory: Positive for cough, sputum production and shortness of breath. Negative for wheezing.   Cardiovascular: Negative for chest pain, orthopnea and PND.  Gastrointestinal: Negative for nausea, vomiting, abdominal pain and diarrhea.  Genitourinary: Negative for dysuria and hematuria.  Neurological: Negative for dizziness, sensory change and focal weakness.  All other systems reviewed and are negative.  Nutrition: Heart Healthy Tolerating Diet: Yes DRUG ALLERGIES:   Allergies  Allergen Reactions  . Asa [Aspirin] Other (See Comments)    Reaction: swelling of the right side.  . Codeine Other (See Comments)    Reaction: Difficulty breathing  . Ibuprofen Other (See Comments)    Reaction: Swelling of the right side.   VITALS:  Blood pressure 122/66, pulse 98, temperature 97.1 F (36.2 C), temperature source Oral, resp. rate 18, height  (1.778 m), weight 89.359 kg (197 lb), SpO2 91 %. PHYSICAL EXAMINATION:  Physical Exam  GENERAL:  59 y.o.-year-old patient lying in the bed in NAD.  EYES: Pupils equal, round, reactive to light and accommodation. No scleral icterus. Extraocular muscles intact.  HEENT: Head atraumatic, normocephalic. Oropharynx and nasopharynx clear.  NECK:  Supple, no jugular venous distention. No thyroid enlargement, no  tenderness.  LUNGS: Prolonged inspiratory & Exp. Phase. Has end-exp. wheezing. No use of accessory muscles of respiration. No dullness to percussion.  CARDIOVASCULAR: S1, S2 normal. No murmurs, rubs, or gallops.  ABDOMEN: Soft, nontender, nondistended. Bowel sounds present. No organomegaly or mass.  EXTREMITIES: No cyanosis, clubbing or edema b/l.    NEUROLOGIC: Cranial nerves II through XII are intact. No focal Motor or sensory deficits b/l.   PSYCHIATRIC: The patient is alert and oriented x 3. Good Affect.  SKIN: No obvious rash, lesion, or ulcer.  LABORATORY PANEL:   CBC  Recent Labs Lab 04/15/15 0453  WBC 19.3*  HGB 13.7  HCT 42.1  PLT 188   ------------------------------------------------------------------------------------------------------------------  Chemistries   Recent Labs Lab 04/13/15 1152  04/14/15 1430 04/15/15 0453  NA 133*  < >  --  139  K 5.0  < >  --  4.0  CL 97*  < >  --  98*  CO2 28  < >  --  33*  GLUCOSE 100*  < >  --  160*  BUN 16  < >  --  30*  CREATININE 1.22  < >  --  1.14  CALCIUM 8.4*  < >  --  9.0  MG  --   --  2.2  --   AST 17  --   --   --   ALT 21  --   --   --   ALKPHOS 61  --   --   --   BILITOT 1.0  --   --   --   < > = values  in this interval not displayed. ASSESSMENT AND PLAN:   59 year old male with past medical history of COPD, anxiety, CHF, depression, GERD who presented to the hospital due to shortness of breath and noted to be in acute on chronic respiratory failure due to COPD exacerbation.  #1 Acute on chronic respiratory failure due to COPD exacerbation-continue IV steroids, DuoNeb nebs around-the-clock,Spiriva, Pulmocort nebs, theophylline.  -Patient is on oxygen at home at 2 L. - O2 still at 5 L Today - not able to wean - started on Vanco for MRSA in the sputum and will add zithromax for atypical coverage - appreciate pulmo input - if no improvement - may need deep culture or bronch to r/o any other etiology.  - he  reports heat making his breathing worse and normally he feels hot even though it's cold in house.  #2 COPD exacerbation- continue Solumedrol, duonebs, Theophylline, Spiriva.  -  Slow to improve. - already on O2 at home. Repeat Sputum Cx pending.  Does have hx of MRSA in sputum from last admission - cont. IV Vanco, Zithromax for now.   - follows w/ Dr. Freda Munro as outpatient - Dr Belia Heman seen while here.  #3 Pulmonary nodule- recommended CT scan of the chest in 6 months to ensure stability.  #4 acute on chronic diastolic CHF- continue IV Lasix 40 mg BID but if renal function worsens - will hold his Lasix.  #5 Hypertension essential - continue Cardizem  #6 Depression/Anxiety-continue low-dose Lexapro, also on alprazolam  #7 generalized Weakness- seen by physical therapy and recommended home health services.    #8 Nicotine addiction again smoking cessation done by admitting Dr - recommend that he stop smoking 4 minutes spent to be started on a nicotine patch    #9 Neuropathy:doing much better on 400 mg (2 tabs of 200 mg) TID of gabapentin.   All the records are reviewed and case discussed with Care Management/Social Worker. Management plans discussed with the patient, family and they are in agreement.  CODE STATUS: Full  DVT Prophylaxis: Heparin subcutaneous  TOTAL TIME TAKING CARE OF THIS PATIENT: 25 minutes.   More than 50% of the time was spent in counseling/coordination of care: YES  POSSIBLE D/C in 2-3 days., DEPENDING ON CLINICAL CONDITION. Tenuous Pulmo status   [facility], Maude Hettich M.D on 04/15/2015 at 6:08 PM  Between 7am to 6pm - Pager - [phone]  After 6pm go to [email or web address] - password [email or web address] [facility]  [place]  Hospitalists  Office  [phone]  CC: Primary care physician; Lyndon Code, MD

## 2015-04-15 NOTE — Care Management Important Message (Signed)
Important Message  Patient Details  Name: Dustin [name] MRN: [id number] Date of Birth: 08-03-1956   Medicare Important Message Given:  Yes-second notification given    Collie Siad, RN 04/15/2015, 9:38 AM

## 2015-04-15 NOTE — Care Management (Signed)
Notified by [place] at [facility] that they are reviewing patient for Rx needs to see if they can help. He has not brought in his electric bill or household income as of yet. I reminded him to have his family bring that in- he agrees to call this RNCM when he has paperwork.

## 2015-04-15 NOTE — Progress Notes (Signed)
Patient teaching giving on incentive spirometer, patient demonstrated.

## 2015-04-15 NOTE — Progress Notes (Signed)
Clinical Education officer, museum (CSW) met with patient and provided [employer]. Resources provided included 211, Link, DSS, and [employer]. CSW will continue to follow and assist as needed.   Blima Rich, [place] [phone]

## 2015-04-15 NOTE — Progress Notes (Signed)
ANTIBIOTIC CONSULT NOTE  Pharmacy Consult for Vancomycin Indication: AECOPD, history of MRSA in sputum   Allergies  Allergen Reactions  . Asa [Aspirin] Other (See Comments)    Reaction: swelling of the right side.  . Codeine Other (See Comments)    Reaction: Difficulty breathing  . Ibuprofen Other (See Comments)    Reaction: Swelling of the right side.    Patient Measurements: Height: 5\' 10"  (177.8 cm) Weight: 197 lb (89.359 kg) IBW/kg (Calculated) : 73 Adjusted Body Weight: 79.6 kg  Vital Signs: Temp: 97.1 F (36.2 C) (07/28 0827) Temp Source: Oral (07/28 0827) BP: 154/58 mmHg (07/28 0827) Pulse Rate: 85 (07/28 0827) Intake/Output from previous day: 07/27 0701 - 07/28 0700 In: 243 [P.O.:240; I.V.:3] Out: 3200 [Urine:3200] Intake/Output from this shift: Total I/O In: -  Out: 1250 [Urine:1250]  Labs:  Recent Labs  04/13/15 1152 04/14/15 0540 04/15/15 0453  WBC 13.5* 12.4* 19.3*  HGB 13.9 14.3 13.7  PLT 177 186 188  CREATININE 1.22 1.02 1.14   Estimated Creatinine Clearance: 76.6 mL/min (by C-G formula based on Cr of 1.14). No results for input(s): VANCOTROUGH, VANCOPEAK, VANCORANDOM, GENTTROUGH, GENTPEAK, GENTRANDOM, TOBRATROUGH, TOBRAPEAK, TOBRARND, AMIKACINPEAK, AMIKACINTROU, AMIKACIN in the last 72 hours.   Microbiology: Recent Results (from the past 720 hour(s))  MRSA PCR Screening     Status: None   Collection Time: 04/01/15  1:20 PM  Result Value Ref Range Status   MRSA by PCR NEGATIVE NEGATIVE Final    Comment:        The GeneXpert MRSA Assay (FDA approved for NASAL specimens only), is one component of a comprehensive MRSA colonization surveillance program. It is not intended to diagnose MRSA infection nor to guide or monitor treatment for MRSA infections.   Culture, expectorated sputum-assessment     Status: None   Collection Time: 04/01/15  2:45 PM  Result Value Ref Range Status   Specimen Description SPUTUM  Final   Special Requests  Normal  Final   Sputum evaluation THIS SPECIMEN IS ACCEPTABLE FOR SPUTUM CULTURE  Final   Report Status 04/01/2015 FINAL  Final  Culture, respiratory (NON-Expectorated)     Status: None   Collection Time: 04/01/15  2:45 PM  Result Value Ref Range Status   Specimen Description SPUTUM  Final   Special Requests Normal Reflexed from [id number]  Final   Gram Stain   Final    MANY WBC SEEN RARE GRAM NEGATIVE RODS RARE GRAM POSITIVE COCCI    Culture   Final    RARE GROWTH METHICILLIN RESISTANT STAPHYLOCOCCUS AUREUS MODERATE GROWTH HAEMOPHILUS INFLUENZAE CRITICAL RESULT CALLED TO, READ BACK BY AND VERIFIED WITH: DR. Cherlynn Kaiser AT 0454 04/04/15 CTJ    Report Status 04/05/2015 FINAL  Final   Organism ID, Bacteria METHICILLIN RESISTANT STAPHYLOCOCCUS AUREUS  Final   Organism ID, Bacteria HAEMOPHILUS INFLUENZAE  Final      Susceptibility   Methicillin resistant staphylococcus aureus - MIC*    CIPROFLOXACIN >=8 RESISTANT Resistant     GENTAMICIN <=0.5 SENSITIVE Sensitive     OXACILLIN Value in next row Resistant      >=4 MODERATELY RESISTANT    VANCOMYCIN <=0.5 SENSITIVE Sensitive     TRIMETH/SULFA <=10 SENSITIVE Sensitive     CEFOXITIN SCREEN Value in next row Resistant      POSITIVECEFOXITIN SCREEN - This test may be used to predict mecA-mediated oxacillin resistance, and it is based on the cefoxitin disk screen test.  The cefoxitin screen and oxacillin work in combination to determine the  final interpretation reported for oxacillin.     Inducible Clindamycin Value in next row Sensitive      POSITIVECEFOXITIN SCREEN - This test may be used to predict mecA-mediated oxacillin resistance, and it is based on the cefoxitin disk screen test.  The cefoxitin screen and oxacillin work in combination to determine the final interpretation reported for oxacillin.     ERYTHROMYCIN Value in next row Resistant      RESISTANT>=8    TETRACYCLINE Value in next row Sensitive      SENSITIVE<=1    CLINDAMYCIN Value in  next row Resistant      RESISTANT>=8    LINEZOLID Value in next row Sensitive      SENSITIVE2    LEVOFLOXACIN Value in next row Resistant      RESISTANT>=8    * RARE GROWTH METHICILLIN RESISTANT STAPHYLOCOCCUS AUREUS  Culture, expectorated sputum-assessment     Status: None   Collection Time: 04/14/15  2:00 PM  Result Value Ref Range Status   Specimen Description SPUTUM  Final   Special Requests NONE  Final   Sputum evaluation THIS SPECIMEN IS ACCEPTABLE FOR SPUTUM CULTURE  Final   Report Status 04/14/2015 FINAL  Final    Medical History: Past Medical History  Diagnosis Date  . Asthma   . COPD (chronic obstructive pulmonary disease)   . Allergy   . Anxiety   . CHF (congestive heart failure)   . Depression   . Emphysema of lung   . GERD (gastroesophageal reflux disease)     Medications:  Prescriptions prior to admission  Medication Sig Dispense Refill Last Dose  . Albuterol Sulfate (PROAIR RESPICLICK) 108 (90 BASE) MCG/ACT AEPB Inhale 2 puffs into the lungs every 4 (four) hours as needed (for shortness of breath).   04/13/2015 at Unknown time  . ALPRAZolam (XANAX) 1 MG tablet Take 0.5-1 mg by mouth 3 (three) times daily as needed for anxiety.   04/13/2015 at Unknown time  . diltiazem (CARDIZEM) 120 MG tablet Take 120 mg by mouth daily.   04/13/2015 at Unknown time  . doxazosin (CARDURA) 4 MG tablet Take 4 mg by mouth daily.   04/13/2015 at Unknown time  . escitalopram (LEXAPRO) 20 MG tablet Take 20 mg by mouth daily.   04/13/2015 at Unknown time  . gabapentin (NEURONTIN) 100 MG capsule Take 200 mg by mouth 3 (three) times daily.   04/13/2015 at Unknown time  . ipratropium-albuterol (DUONEB) 0.5-2.5 (3) MG/3ML SOLN Take 3 mLs by nebulization 4 (four) times daily as needed (for shortness of breath).   04/13/2015 at Unknown time  . levofloxacin (LEVAQUIN) 500 MG tablet Take 1 tablet (500 mg total) by mouth daily. 10 tablet 0 04/13/2015 at Unknown time  . mometasone-formoterol (DULERA)  100-5 MCG/ACT AERO Inhale 2 puffs into the lungs 2 (two) times daily. 1 Inhaler 2 04/13/2015 at Unknown time  . predniSONE (DELTASONE) 10 MG tablet Label  & dispense according to the schedule below. 5 Pills PO for 2 days then, 4 Pills PO for 2 days, 3 Pills PO for 2 days, 2 Pills PO for 2 days, 1 Pill PO for 2 days then STOP. 30 tablet 0 04/13/2015 at Unknown time  . sulfamethoxazole-trimethoprim (BACTRIM DS,SEPTRA DS) 800-160 MG per tablet Take 1 tablet by mouth 2 (two) times daily. 20 tablet 0 04/13/2015 at Unknown time  . theophylline (THEO-24) 400 MG 24 hr capsule Take 400 mg by mouth daily.   04/13/2015 at Unknown time   Assessment: Pharmacy  consulted to dose vancomycin for AECOPD in this 59 year old male. Patient with recent sputum culture (7/14) with MRSA and h. Flu, treated with bactrim & levaquin.   Azithromycin added 7/27  Pk parameters (calculated): CrCl = 71.6 ml/min Ke = 0.06 hr-1 T1/2 = 11.5 hrs Vd = 62.6 L  Goal of Therapy:  Vancomycin trough 15-44mcg/ml  Plan:  Will continue current orders for vancomycin 1250mg  IV Q12H. Through ordered prior to 5th dose, which should be approaching steady state.   Pharmacy to follow per consult.  Garlon Hatchet, PharmD Clinical Pharmacist  04/15/2015,9:43 AM

## 2015-04-16 ENCOUNTER — Encounter: Payer: Self-pay | Admitting: Physician Assistant

## 2015-04-16 DIAGNOSIS — I5033 Acute on chronic diastolic (congestive) heart failure: Secondary | ICD-10-CM

## 2015-04-16 LAB — BASIC METABOLIC PANEL
Anion gap: 10 (ref 5–15)
BUN: 34 mg/dL — AB (ref 6–20)
CO2: 34 mmol/L — ABNORMAL HIGH (ref 22–32)
Calcium: 8.7 mg/dL — ABNORMAL LOW (ref 8.9–10.3)
Chloride: 94 mmol/L — ABNORMAL LOW (ref 101–111)
Creatinine, Ser: 1.09 mg/dL (ref 0.61–1.24)
GFR calc non Af Amer: >60 mL/min (ref >60)
Glucose, Bld: 124 mg/dL — ABNORMAL HIGH (ref 65–99)
Potassium: 3.8 mmol/L (ref 3.5–5.1)
Sodium: 138 mmol/L (ref 135–145)

## 2015-04-16 LAB — VANCOMYCIN, TROUGH: VANCOMYCIN TR: 18 ug/mL (ref 10–20)

## 2015-04-16 LAB — BRAIN NATRIURETIC PEPTIDE: B NATRIURETIC PEPTIDE 5: 64 pg/mL (ref 0.0–100.0)

## 2015-04-16 MED ORDER — FUROSEMIDE 40 MG PO TABS
40.0000 mg | ORAL_TABLET | Freq: Two times a day (BID) | ORAL | Status: DC
Start: 2015-04-16 — End: 2015-04-21
  Administered 2015-04-16 – 2015-04-21 (×10): 40 mg via ORAL
  Filled 2015-04-16 (×10): qty 1

## 2015-04-16 MED ORDER — SENNOSIDES-DOCUSATE SODIUM 8.6-50 MG PO TABS
2.0000 | ORAL_TABLET | Freq: Two times a day (BID) | ORAL | Status: DC
  Administered 2015-04-16 – 2015-04-20 (×8): 2 via ORAL
  Filled 2015-04-16 (×10): qty 2

## 2015-04-16 MED ORDER — POTASSIUM CHLORIDE CRYS ER 10 MEQ PO TBCR
10.0000 meq | EXTENDED_RELEASE_TABLET | Freq: Two times a day (BID) | ORAL | Status: DC
  Administered 2015-04-16 – 2015-04-21 (×11): 10 meq via ORAL
  Filled 2015-04-16 (×11): qty 1

## 2015-04-16 NOTE — Progress Notes (Signed)
[facility] - [place] at [facility]   PATIENT NAME: Dustin [name]    MR#:  [id number]  DATE OF BIRTH:  Aug 17, 1956  SUBJECTIVE:  CHIEF COMPLAINT:   Chief Complaint  Patient presents with  . Shortness of Breath  sputum is not as thick anymore and breaking up - instead of green-yellow it's more whitish clear. Still on 5 liters O2  REVIEW OF SYSTEMS:   Review of Systems  Constitutional: Negative for fever and chills.  HENT: Negative for congestion and tinnitus.   Eyes: Negative for blurred vision and double vision.  Respiratory: Positive for cough, sputum production and shortness of breath. Negative for wheezing.   Cardiovascular: Negative for chest pain, orthopnea and PND.  Gastrointestinal: Negative for nausea, vomiting, abdominal pain and diarrhea.  Genitourinary: Negative for dysuria and hematuria.  Neurological: Negative for dizziness, sensory change and focal weakness.  All other systems reviewed and are negative.  Nutrition: Heart Healthy Tolerating Diet: Yes DRUG ALLERGIES:   Allergies  Allergen Reactions  . Asa [Aspirin] Other (See Comments)    Reaction: swelling of the right side.  . Codeine Other (See Comments)    Reaction: Difficulty breathing  . Ibuprofen Other (See Comments)    Reaction: Swelling of the right side.   VITALS:  Blood pressure 112/60, pulse 103, temperature 97.1 F (36.2 C), temperature source Axillary, resp. rate 18, height 5\' 10"  (1.778 m), weight 89.359 kg (197 lb), SpO2 91 %. PHYSICAL EXAMINATION:  Physical Exam  GENERAL:  59 y.o.-year-old patient lying in the bed in NAD.  EYES: Pupils equal, round, reactive to light and accommodation. No scleral icterus. Extraocular muscles intact.  HEENT: Head atraumatic, normocephalic. Oropharynx and nasopharynx clear.  NECK:  Supple, no jugular venous distention. No thyroid enlargement, no tenderness.  LUNGS: Prolonged inspiratory & Exp. Phase. Has end-exp. wheezing. No use of  accessory muscles of respiration. No dullness to percussion.  CARDIOVASCULAR: S1, S2 normal. No murmurs, rubs, or gallops.  ABDOMEN: Soft, nontender, nondistended. Bowel sounds present. No organomegaly or mass.  EXTREMITIES: No cyanosis, clubbing or edema b/l.    NEUROLOGIC: Cranial nerves II through XII are intact. No focal Motor or sensory deficits b/l.   PSYCHIATRIC: The patient is alert and oriented x 3. Good Affect.  SKIN: No obvious rash, lesion, or ulcer.  LABORATORY PANEL:   CBC  Recent Labs Lab 04/15/15 0453  WBC 19.3*  HGB 13.7  HCT 42.1  PLT 188   ------------------------------------------------------------------------------------------------------------------  Chemistries   Recent Labs Lab 04/13/15 1152  04/14/15 1430  04/16/15 0520  NA 133*  < >  --   < > 138  K 5.0  < >  --   < > 3.8  CL 97*  < >  --   < > 94*  CO2 28  < >  --   < > 34*  GLUCOSE 100*  < >  --   < > 124*  BUN 16  < >  --   < > 34*  CREATININE 1.22  < >  --   < > 1.09  CALCIUM 8.4*  < >  --   < > 8.7*  MG  --   --  2.2  --   --   AST 17  --   --   --   --   ALT 21  --   --   --   --   ALKPHOS 61  --   --   --   --  BILITOT 1.0  --   --   --   --   < > = values in this interval not displayed. ASSESSMENT AND PLAN:   59 year old male with past medical history of COPD, anxiety, CHF, depression, GERD who presented to the hospital due to shortness of breath and noted to be in acute on chronic respiratory failure due to COPD exacerbation.  #1 Acute on chronic respiratory failure due to COPD exacerbation-continue IV steroids, DuoNeb nebs around-the-clock,Spiriva, Pulmocort nebs, theophylline.  -Patient is on oxygen at home at 2 L. - O2 still at 5 L Today - not able to wean - started on Vanco for MRSA in the sputum and will add zithromax for atypical coverage - appreciate pulmo input - if no improvement - may need deep culture or bronch to r/o any other etiology.  - he reports heat making his  breathing worse and normally he feels hot even though it's cold in house.  #2 COPD exacerbation- continue Solumedrol, duonebs, Theophylline, Spiriva.  -  Slow to improve. - already on O2 at home. Repeat Sputum Cx pending.  Does have hx of MRSA in sputum from last admission - cont. IV Vanco, Zithromax for now.   - follows w/ Dr. Freda Munro as outpatient - Dr Belia Heman seen while here.  #3 Pulmonary nodule- recommended CT scan of the chest in 6 months to ensure stability. Likely in Dec'16  #4 Acute on chronic diastolic CHF- continue IV Lasix 40 mg BID for now, appreciate cardio input. Recheck echo. Neg 9 liters thus far  #5 Hypertension essential - continue Cardizem  #6 Depression/Anxiety-continue low-dose Lexapro, also on alprazolam  #7 Generalized Weakness- seen by physical therapy and recommended home health services.    #8 Nicotine addiction again smoking cessation done by admitting Dr - recommend that he stop smoking 4 minutes spent to be started on a nicotine patch    #9 Neuropathy:doing much better on 400 mg (2 tabs of 200 mg) TID of gabapentin.   All the records are reviewed and case discussed with Care Management/Social Worker. Management plans discussed with the patient, family (his sister from [place] called nursing last evening wanting Dr Windell Hummingbird group consulted )and they are in agreement.  CODE STATUS: Full  DVT Prophylaxis: Heparin subcutaneous  TOTAL TIME TAKING CARE OF THIS PATIENT: 25 minutes.   More than 50% of the time was spent in counseling/coordination of care: YES  POSSIBLE D/C in 2-3 days., DEPENDING ON CLINICAL CONDITION.    [facility], Stacyann Mcconaughy M.D on 04/16/2015 at 11:35 AM  Between 7am to 6pm - Pager - [phone]  After 6pm go to [email or web address] - password [email or web address]  [place] [facility]  Office  [phone]  CC: Primary care physician; Lyndon Code, MD

## 2015-04-16 NOTE — Progress Notes (Signed)
ANTIBIOTIC CONSULT NOTE  Pharmacy Consult for Vancomycin Indication: AECOPD, history of MRSA in sputum   Allergies  Allergen Reactions  . Asa [Aspirin] Other (See Comments)    Reaction: swelling of the right side.  . Codeine Other (See Comments)    Reaction: Difficulty breathing  . Ibuprofen Other (See Comments)    Reaction: Swelling of the right side.    Patient Measurements: Height: 5\' 10"  (177.8 cm) Weight: 197 lb (89.359 kg) IBW/kg (Calculated) : 73 Adjusted Body Weight: 79.6 kg  Vital Signs: Temp: 98.1 F (36.7 C) (07/28 1944) Temp Source: Oral (07/28 1944) BP: 105/65 mmHg (07/28 1944) Pulse Rate: 111 (07/28 1946) Intake/Output from previous day: 07/28 0701 - 07/29 0700 In: 1680 [P.O.:1680] Out: 4203 [Urine:4203] Intake/Output from this shift: Total I/O In: 240 [P.O.:240] Out: 753 [Urine:753]  Labs:  Recent Labs  04/13/15 1152 04/14/15 0540 04/15/15 0453  WBC 13.5* 12.4* 19.3*  HGB 13.9 14.3 13.7  PLT 177 186 188  CREATININE 1.22 1.02 1.14   Estimated Creatinine Clearance: 76.6 mL/min (by C-G formula based on Cr of 1.14).  Recent Labs  04/16/15 0259  [facility] 18     Microbiology: Recent Results (from the past 720 hour(s))  MRSA PCR Screening     Status: None   Collection Time: 04/01/15  1:20 PM  Result Value Ref Range Status   MRSA by PCR NEGATIVE NEGATIVE Final    Comment:        The GeneXpert MRSA Assay (FDA approved for NASAL specimens only), is one component of a comprehensive MRSA colonization surveillance program. It is not intended to diagnose MRSA infection nor to guide or monitor treatment for MRSA infections.   Culture, expectorated sputum-assessment     Status: None   Collection Time: 04/01/15  2:45 PM  Result Value Ref Range Status   Specimen Description SPUTUM  Final   Special Requests Normal  Final   Sputum evaluation THIS SPECIMEN IS ACCEPTABLE FOR SPUTUM CULTURE  Final   Report Status 04/01/2015 FINAL  Final   Culture, respiratory (NON-Expectorated)     Status: None   Collection Time: 04/01/15  2:45 PM  Result Value Ref Range Status   Specimen Description SPUTUM  Final   Special Requests Normal Reflexed from [id number]  Final   Gram Stain   Final    MANY WBC SEEN RARE GRAM NEGATIVE RODS RARE GRAM POSITIVE COCCI    Culture   Final    RARE GROWTH METHICILLIN RESISTANT STAPHYLOCOCCUS AUREUS MODERATE GROWTH HAEMOPHILUS INFLUENZAE CRITICAL RESULT CALLED TO, READ BACK BY AND VERIFIED WITH: DR. Cherlynn Kaiser AT [phone] 04/04/15 CTJ    Report Status 04/05/2015 FINAL  Final   Organism ID, Bacteria METHICILLIN RESISTANT STAPHYLOCOCCUS AUREUS  Final   Organism ID, Bacteria HAEMOPHILUS INFLUENZAE  Final      Susceptibility   Methicillin resistant staphylococcus aureus - MIC*    CIPROFLOXACIN >=8 RESISTANT Resistant     GENTAMICIN <=0.5 SENSITIVE Sensitive     OXACILLIN Value in next row Resistant      >=4 MODERATELY RESISTANT    VANCOMYCIN <=0.5 SENSITIVE Sensitive     TRIMETH/SULFA <=10 SENSITIVE Sensitive     CEFOXITIN SCREEN Value in next row Resistant      POSITIVECEFOXITIN SCREEN - This test may be used to predict mecA-mediated oxacillin resistance, and it is based on the cefoxitin disk screen test.  The cefoxitin screen and oxacillin work in combination to determine the final interpretation reported for oxacillin.     Inducible Clindamycin  Value in next row Sensitive      POSITIVECEFOXITIN SCREEN - This test may be used to predict mecA-mediated oxacillin resistance, and it is based on the cefoxitin disk screen test.  The cefoxitin screen and oxacillin work in combination to determine the final interpretation reported for oxacillin.     ERYTHROMYCIN Value in next row Resistant      RESISTANT>=8    TETRACYCLINE Value in next row Sensitive      SENSITIVE<=1    CLINDAMYCIN Value in next row Resistant      RESISTANT>=8    LINEZOLID Value in next row Sensitive      SENSITIVE2    LEVOFLOXACIN Value in next  row Resistant      RESISTANT>=8    * RARE GROWTH METHICILLIN RESISTANT STAPHYLOCOCCUS AUREUS  Culture, expectorated sputum-assessment     Status: None   Collection Time: 04/14/15  2:00 PM  Result Value Ref Range Status   Specimen Description SPUTUM  Final   Special Requests NONE  Final   Sputum evaluation THIS SPECIMEN IS ACCEPTABLE FOR SPUTUM CULTURE  Final   Report Status 04/14/2015 FINAL  Final  Culture, respiratory (NON-Expectorated)     Status: None (Preliminary result)   Collection Time: 04/14/15  2:00 PM  Result Value Ref Range Status   Specimen Description SPUTUM  Final   Special Requests NONE Reflexed from [id number]  Final   Gram Stain   Final    FEW WBC SEEN FEW YEAST FEW GRAM POSITIVE COCCI IN CHAINS FAIR SPECIMEN - 70-80% WBCS    Culture APPEARS TO BE NORMAL FLORA  Final   Report Status PENDING  Incomplete    Medical History: Past Medical History  Diagnosis Date  . Asthma   . COPD (chronic obstructive pulmonary disease)   . Allergy   . Anxiety   . CHF (congestive heart failure)   . Depression   . Emphysema of lung   . GERD (gastroesophageal reflux disease)     Medications:  Prescriptions prior to admission  Medication Sig Dispense Refill Last Dose  . Albuterol Sulfate (PROAIR RESPICLICK) 108 (90 BASE) MCG/ACT AEPB Inhale 2 puffs into the lungs every 4 (four) hours as needed (for shortness of breath).   04/13/2015 at Unknown time  . ALPRAZolam (XANAX) 1 MG tablet Take 0.5-1 mg by mouth 3 (three) times daily as needed for anxiety.   04/13/2015 at Unknown time  . diltiazem (CARDIZEM) 120 MG tablet Take 120 mg by mouth daily.   04/13/2015 at Unknown time  . doxazosin (CARDURA) 4 MG tablet Take 4 mg by mouth daily.   04/13/2015 at Unknown time  . escitalopram (LEXAPRO) 20 MG tablet Take 20 mg by mouth daily.   04/13/2015 at Unknown time  . gabapentin (NEURONTIN) 100 MG capsule Take 200 mg by mouth 3 (three) times daily.   04/13/2015 at Unknown time  .  ipratropium-albuterol (DUONEB) 0.5-2.5 (3) MG/3ML SOLN Take 3 mLs by nebulization 4 (four) times daily as needed (for shortness of breath).   04/13/2015 at Unknown time  . levofloxacin (LEVAQUIN) 500 MG tablet Take 1 tablet (500 mg total) by mouth daily. 10 tablet 0 04/13/2015 at Unknown time  . mometasone-formoterol (DULERA) 100-5 MCG/ACT AERO Inhale 2 puffs into the lungs 2 (two) times daily. 1 Inhaler 2 04/13/2015 at Unknown time  . predniSONE (DELTASONE) 10 MG tablet Label  & dispense according to the schedule below. 5 Pills PO for 2 days then, 4 Pills PO for 2 days, 3 Pills  PO for 2 days, 2 Pills PO for 2 days, 1 Pill PO for 2 days then STOP. 30 tablet 0 04/13/2015 at Unknown time  . sulfamethoxazole-trimethoprim (BACTRIM DS,SEPTRA DS) 800-160 MG per tablet Take 1 tablet by mouth 2 (two) times daily. 20 tablet 0 04/13/2015 at Unknown time  . theophylline (THEO-24) 400 MG 24 hr capsule Take 400 mg by mouth daily.   04/13/2015 at Unknown time   Assessment: Pharmacy consulted to dose vancomycin for AECOPD in this 59 year old male. Patient with recent sputum culture (7/14) with MRSA and h. Flu, treated with bactrim & levaquin.   Azithromycin added 7/27  Pk parameters (calculated): CrCl = 71.6 ml/min Ke = 0.06 hr-1 T1/2 = 11.5 hrs Vd = 62.6 L  Goal of Therapy:  Vancomycin trough 15-34mcg/ml  Plan:  Will continue current orders for vancomycin  IV Q12H. Through ordered prior to 5th dose, which should be approaching steady state.   Pharmacy to follow per consult.  7/29 AM vanc trough 18. Continue current regimen. BMP ordered for this AM since SCr trending upward for last two days.   Fulton Reek, PharmD, BCPS  04/16/2015

## 2015-04-16 NOTE — Care Management (Signed)
Patient now on 5 liters/[place]; SOB. Spoke again with patient and family has not delivered requested documents to evaluate for [facility] foundation assistance. Patient aware that foundation will not be available over the weekend nor will Med Management. He can attempt to use MATCH card

## 2015-04-16 NOTE — Consult Note (Signed)
Cardiology Consultation Note  Patient ID: Dustin [name], MRN: [id number], DOB/AGE: 1955-12-07 59 y.o. Admit date: 04/13/2015   Date of Consult: 04/16/2015 Primary Physician: Lyndon Code, MD Primary Cardiologist: New to [facility]  Chief Complaint: SOB Reason for Consult: Acute on chronic diastolic CHF  HPI: 59 y.o. male with h/o chronic respiratory failure on 2L via nasal cannula secondary to COPD, chronic diastolic CHF/pulmonary HTN, asthma, history of pulmonary nodule with small pericardial effusion, ongoing tobacco abuse with reported recent cessation, GERD, anxiety, and depression who was recently admitted to [facility] from 7/13 to 7/18 for acute on chronic respiratory failure with hypoxia secondary to COPD exacerbation returned to [facility] on 7/26 with increased dyspnea, COPD exacerbation, and acute on chronic diastolic CHF.   He has known diastolic CHF/pulmonary hypertension by echo done in 07/2013 that showed EF 60-65%, diastolic dysfunction, moderated pulmonary hypertension, biatrial dilatation, aortic sclerosis, dilated right ventricle, and elevated central venous and right atrial pressures. He has been managed with prn Lasix and potassium supplementation since that time and done fairly well. He reports having had a recent echo done at Dr. Sampson Goon office in June of 2016 to evaluate the above pericardial effusion seen on chest CT found on 03/17/2015. Have called Dr. Milta Deiters office for echo results on 03/25/2015. He reports drinking a considerable amount of fluids at baseline, possibly greater than 2L daily. He does not apply extra salt to his foods. He denies any lower extremity swelling, though does report abdominal distension at times. He continued to smoke until recently, though is unable to tell me when he exactly quit. He reports his weight is actually decreasing.   He was recently hospitalized from 7/13 to 7/18 at [facility] for acute on chronic respiratory failure with hypoxia (O2 requirements  increased from 2L to 5L while inpatient, ultimately weaned back to 2 to 2.5L) in the setting of COPD exacerbation. CXR 7/13 with no acute findings. CXR 7/15 with mild bibasilar opacities concerning for probable pleural effusions with atelectasis vs infiltrate. He was treated for such and discharged with Levaquin and Bactrim DS. Heart failure was not felt to be an issue. Weight at the time of his presentation on 7/13 was 196 pounds.    He presented to [facility] again on 7/26 with increased SOB after being seen at his PCP and was found to be hypotensive. Upon his arrival to [facility] he was on his baseline 2L of oxygen with sats at 96%. BP was 114/68. He complained of continued productive cough of green to yellow sputum and feeling dizzy. No angina. Weight was stable at 197 pounds. His oxygen requirement has increased from 2L via nasal cannula to 5L via nasal cannula. No lower extremity edema. CXR on 7/26 showed unchanged mild bibasilar atelectasis/scarring. He was treated with lasix 40 mg bid. He has continued to cough up yellow to green sputum. Currently on nebs, steroids, and IV antibiotics.       Past Medical History  Diagnosis Date  . Asthma   . COPD (chronic obstructive pulmonary disease)   . Allergy   . Anxiety   . Chronic diastolic CHF (congestive heart failure)     a. echo 07/2013: EF 60-65%, DD, biatrial dilatation, Ao sclerosis, dilated RV, moderate pulmonary HTN, elevated CV and RA pressures; b. patient reported echo at Dr. Milta Deiters office 02/2015 - his office does not have record of him being a pt there   . Depression   . Emphysema of lung   . GERD (gastroesophageal reflux  disease)   . Tobacco abuse   . Depression   . Chronic respiratory failure     a. on 2L via nasal cannula; b. secondary to COPD      Most Recent Cardiac Studies: Echo 07/2013:  EF 60-65%, diastolic dysfunction, biatrial dilatation, aortic sclerosis, dilated right ventricle, moderate pulmonary HTN, elevated central venous and  right atrial pressures.   Surgical History:  Past Surgical History  Procedure Laterality Date  . Abdominal surgery    . Nose surgery    . Hemorrh       Home Meds: Prior to Admission medications   Medication Sig Start Date End Date Taking? Authorizing Provider  Albuterol Sulfate (PROAIR RESPICLICK) 108 (90 BASE) MCG/ACT AEPB Inhale 2 puffs into the lungs every 4 (four) hours as needed (for shortness of breath).   Yes Historical Provider, MD  ALPRAZolam Prudy Feeler) 1 MG tablet Take 0.5-1 mg by mouth 3 (three) times daily as needed for anxiety.   Yes Historical Provider, MD  diltiazem (CARDIZEM) 120 MG tablet Take 120 mg by mouth daily.   Yes Historical Provider, MD  doxazosin (CARDURA) 4 MG tablet Take 4 mg by mouth daily.   Yes Historical Provider, MD  escitalopram (LEXAPRO) 20 MG tablet Take 20 mg by mouth daily.   Yes Historical Provider, MD  gabapentin (NEURONTIN) 100 MG capsule Take 200 mg by mouth 3 (three) times daily.   Yes Historical Provider, MD  ipratropium-albuterol (DUONEB) 0.5-2.5 (3) MG/3ML SOLN Take 3 mLs by nebulization 4 (four) times daily as needed (for shortness of breath).   Yes Historical Provider, MD  levofloxacin (LEVAQUIN) 500 MG tablet Take 1 tablet (500 mg total) by mouth daily. 04/05/15  Yes Houston Siren, MD  mometasone-formoterol (DULERA) 100-5 MCG/ACT AERO Inhale 2 puffs into the lungs 2 (two) times daily. 04/05/15  Yes Houston Siren, MD  predniSONE (DELTASONE) 10 MG tablet Label  & dispense according to the schedule below. 5 Pills PO for 2 days then, 4 Pills PO for 2 days, 3 Pills PO for 2 days, 2 Pills PO for 2 days, 1 Pill PO for 2 days then STOP. 04/05/15  Yes Houston Siren, MD  sulfamethoxazole-trimethoprim (BACTRIM DS,SEPTRA DS) 800-160 MG per tablet Take 1 tablet by mouth 2 (two) times daily. 04/05/15  Yes Houston Siren, MD  theophylline (THEO-24) 400 MG 24 hr capsule Take 400 mg by mouth daily.   Yes Historical Provider, MD    Inpatient Medications:  .  acidophilus  1 capsule Oral Daily  . albuterol  2.5 mg Nebulization Q4H  . azithromycin  500 mg Oral Daily  . diltiazem  120 mg Oral Daily  . doxazosin  4 mg Oral Daily  . enoxaparin (LOVENOX) injection  40 mg Subcutaneous Q24H  . escitalopram  20 mg Oral Daily  . furosemide  40 mg Intravenous Q12H  . gabapentin  400 mg Oral TID  . guaiFENesin  600 mg Oral BID  . methylPREDNISolone (SOLU-MEDROL) injection  60 mg Intravenous Q6H  . mometasone-formoterol  2 puff Inhalation BID  . nicotine  21 mg Transdermal Daily  . sodium chloride  3 mL Intravenous Q12H  . sodium chloride  3 mL Intravenous Q12H  . theophylline  400 mg Oral Daily  . tiotropium  18 mcg Inhalation Daily  . vancomycin  1,250 mg Intravenous Q12H      Allergies:  Allergies  Allergen Reactions  . Asa [Aspirin] Other (See Comments)    Reaction: swelling of the  right side.  . Codeine Other (See Comments)    Reaction: Difficulty breathing  . Ibuprofen Other (See Comments)    Reaction: Swelling of the right side.    History   Social History  . Marital Status: Married    Spouse Name: N/A  . Number of Children: N/A  . Years of Education: N/A   Occupational History  . Not on file.   Social History Main Topics  . Smoking status: Former Smoker -- 0.25 packs/day for 35 years    Types: Cigarettes  . Smokeless tobacco: Not on file  . Alcohol Use: No  . Drug Use: Not on file  . Sexual Activity: Not on file   Other Topics Concern  . Not on file   Social History Narrative     Family History  Problem Relation Age of Onset  . Diabetes Neg Hx      Review of Systems: Review of Systems  Constitutional: Positive for weight loss and malaise/fatigue. Negative for fever, chills and diaphoresis.  HENT: Positive for congestion.   Eyes: Negative for discharge and redness.  Respiratory: Positive for sputum production, shortness of breath and wheezing. Negative for cough and hemoptysis.        Green-yellow sputum    Cardiovascular: Negative for chest pain, palpitations, orthopnea, claudication, leg swelling and PND.  Gastrointestinal: Negative for heartburn, nausea, vomiting, abdominal pain, diarrhea, constipation, blood in stool and melena.       Positive for abdominal distension   Genitourinary: Negative for hematuria.  Musculoskeletal: Negative for myalgias and falls.  Skin: Negative for rash.  Neurological: Positive for weakness. Negative for sensory change, speech change and focal weakness.  Endo/Heme/Allergies: Does not bruise/bleed easily.  Psychiatric/Behavioral: Positive for substance abuse. The patient is not nervous/anxious.        Tobacco abuse - recently quit (unable to tell me date)     Labs:  Recent Labs  04/13/15 1152  TROPONINI <0.03   Lab Results  Component Value Date   WBC 19.3* 04/15/2015   HGB 13.7 04/15/2015   HCT 42.1 04/15/2015   MCV 95.8 04/15/2015   PLT 188 04/15/2015    Recent Labs Lab 04/13/15 1152  04/16/15 0520  NA 133*  < > 138  K 5.0  < > 3.8  CL 97*  < > 94*  CO2 28  < > 34*  BUN 16  < > 34*  CREATININE 1.22  < > 1.09  CALCIUM 8.4*  < > 8.7*  PROT 6.4*  --   --   BILITOT 1.0  --   --   ALKPHOS 61  --   --   ALT 21  --   --   AST 17  --   --   GLUCOSE 100*  < > 124*  < > = values in this interval not displayed. Lab Results  Component Value Date   CHOL 142 09/24/2013   HDL 56 09/24/2013   LDLCALC 71 09/24/2013   TRIG 74 09/24/2013   No results found for: DDIMER  Radiology/Studies:  Dg Chest 2 View  04/02/2015   CLINICAL DATA:  Shortness of breath.  EXAM: CHEST  2 VIEW  COMPARISON:  March 31, 2015.  FINDINGS: No pneumothorax is noted. Stable cardiomediastinal silhouette. Increased bibasilar opacities are noted concerning for probable bilateral pleural effusions with associated atelectasis or infiltrate. Bony thorax is intact.  IMPRESSION: Mild bibasilar opacities are noted concerning for probable pleural effusions with associated atelectasis  or infiltrate. Follow-up radiographs  are recommended.   Electronically Signed   By: Lupita Raider, M.D.   On: 04/02/2015 13:54   Dg Chest 2 View  03/31/2015   CLINICAL DATA:  Worsening shortness of breath and weakness for past 2 days. Hypoxia. COPD.  EXAM: CHEST  2 VIEW  COMPARISON:  09/02/2014  FINDINGS: Pulmonary hyperinflation and diffuse interstitial prominence show no significant change, consistent with COPD. No focal areas of pulmonary consolidation or pleural effusion identified. Heart size is within normal limits. No hilar or mediastinal masses identified. Multiple surgical clips again seen in the left upper quadrant.  IMPRESSION: Stable exam.  COPD.  No acute findings.   Electronically Signed   By: Myles Rosenthal M.D.   On: 03/31/2015 17:55   Ct Chest W Contrast  03/17/2015   CLINICAL DATA:  Followup of lung nodule. History of the asthma/COPD. No history of primary malignancy.  EXAM: CT CHEST WITH CONTRAST  TECHNIQUE: Multidetector CT imaging of the chest was performed during intravenous contrast administration.  CONTRAST:  75mL OMNIPAQUE IOHEXOL 300 MG/ML  SOLN  COMPARISON:  09/15/2014 and 03/09/2014.  FINDINGS: Mediastinum/Nodes: No supraclavicular adenopathy. Normal heart size with mild lipomatous hypertrophy of the interatrial septum. Small pericardial effusion, new. No mediastinal or hilar adenopathy.  Lungs/Pleura: No pleural fluid. Fluid or secretions in the dependent trachea and the carina. Severe emphysema, with bullous disease at the apices. A nodule at the right lung base is most apparent on sagittal image 16, where measures 7 mm. Similar to slightly enlarged from the prior exam, where it measured 6 mm. Also transverse image 59.  No lobar consolidation. Mild volume loss in the posterior aspect of the lingula.  Upper abdomen: Reflux of contrast into the hepatic veins suggesting elevated right heart pressures. Normal imaged portions of the liver, spleen, pancreas. Suspect a gastric body lipoma  of 12 mm on image 64 without obstruction. Normal imaged portions of the adrenal glands and kidneys  Musculoskeletal: Thoracic spondylosis. S-shaped thoracic spine curvature.  IMPRESSION: 1. Right lower lobe pulmonary nodule which measures similar to minimally larger, when compared back to 03/09/2014. Recommend ongoing surveillance with follow-up chest CT at 6 months. This recommendation follows the consensus statement: Guidelines for Management of Small Pulmonary Nodules Detected on CT Scans: A Statement from the Fleischner Society as published in Radiology 2005; 237:395-400. 2. Advanced centrilobular emphysema. 3. New small pericardial effusion. Findings suggestive of elevated right heart pressures.   Electronically Signed   By: Jeronimo Greaves M.D.   On: 03/17/2015 12:43   Dg Chest Portable 1 View  04/13/2015   CLINICAL DATA:  Altered mental status and hypotension today.  EXAM: PORTABLE CHEST - 1 VIEW  COMPARISON:  04/02/2015  FINDINGS: Cardiomegaly and COPD/ emphysema changes again noted.  Mild bibasilar atelectasis/scarring again noted.  There is no evidence of focal airspace disease, pulmonary edema, suspicious pulmonary nodule/mass, pleural effusion, or pneumothorax.  No acute bony abnormalities are identified.  IMPRESSION: No significant change from the prior study. Unchanged mild bibasilar atelectasis/scarring.  Cardiomegaly and COPD/emphysema.   Electronically Signed   By: Harmon Pier M.D.   On: 04/13/2015 12:20    EKG: poor quality images, NSR, 72 bpm, incomplete RBBB, nonspecific st/t changes   Weights: Filed Weights   04/13/15 1122  Weight: 197 lb (89.359 kg)     Physical Exam: Blood pressure 112/60, pulse 103, temperature 97.1 F (36.2 C), temperature source Axillary, resp. rate 18, height 5\' 10"  (1.778 m), weight 197 lb (89.359 kg), SpO2 91 %.  Body mass index is 28.27 kg/(m^2). General: Well developed, well nourished, in no acute distress. Head: Normocephalic, atraumatic, sclera  non-icteric, no xanthomas, nares are without discharge.  Neck: Negative for carotid bruits. JVD not elevated. Lungs: Bilateral wheezing with coarse breath sounds. Breathing is unlabored. Heart: RRR with S1 S2. No murmurs, rubs, or gallops appreciated. Abdomen: Soft, non-tender, non-distended with normoactive bowel sounds. No hepatomegaly. No rebound/guarding. No obvious abdominal masses. Msk:  Strength and tone appear normal for age. Extremities: No clubbing or cyanosis. No edema.  Distal pedal pulses are 2+ and equal bilaterally. Neuro: Alert and oriented X 3. No facial asymmetry. No focal deficit. Moves all extremities spontaneously. Psych:  Responds to questions appropriately with a normal affect.    Assessment and Plan:  59 y.o. male with h/o chronic respiratory failure on 2L via nasal cannula secondary to COPD, chronic diastolic CHF/pulmonary HTN, asthma, history of pulmonary nodule with small pericardial effusion, ongoing tobacco abuse with reported recent cessation, GERD, anxiety, and depression who was recently admitted to [facility] from 7/13 to 7/18 for acute on chronic respiratory failure with hypoxia secondary to COPD exacerbation returned to [facility] on 7/26 with increased dyspnea, COPD exacerbation, and acute on chronic diastolic CHF.   1. Acute on chronic respiratory failure with hypoxia: -Likely multifactorial in the setting of COPD exacerbation and possibly acute on chronic diastolic CHF -Baseline oxygen requirement increased from 2L via nasal cannula to 5L via nasal cannula  -On vancomycin 2/2 MRSA in sputum and azithromycin for atypical coverage  -No signs of hospital acquired PNA at this time -Consider early bronch if no improvement  -On DuoNebs, Spiriva, Pulmocort, theophylline per primary team  -Repeat sputum culture pending -Wean oxygen as able  2. Acute on chronic diastolic CHF/pulmonary HTN: -Agree with IV Lasix 40 mg q 12 hours -Minus 9852 mL for the admission to date of  consult  -SCr stable -Last available echo from 07/2013 as above, called [facility] and no record of patient ever being seen there  -No beta blocker at this time given acute exacerbation -Have called Dr. Milta Deiters office and they are faxing recent echo on 03/24/2015, if we do not receive this by early afternoon would repeat echo  3. COPD exacerbation: -Per #1  4. HTN: -Continue Cardizem CD 120 mg daily  5. Pulmonary nodule: -Will need repeat study in 08/2015  6. Ongoing tobacco abuse: -Reports recent cessation, though unable to tell me when he actually quit -Hazardous to smoke while on O2  7. Depression/anxiety: -Continue current treatment  8. Hypokalemia: -Replete to 4.0    Elinor Dodge, PA-C Pager: [phone] 04/16/2015, 9:53 AM

## 2015-04-16 NOTE — Progress Notes (Signed)
Spoke with patient's daughter who lives in [place] last PM and she requested pt seeing Dr. Mariah Milling in cardiology,  because pt was last diagnosed with CHF, and if he is on lasix, and has fluid, she wants pt seen today (Friday). Will pass on to dayshift nurse to let rounding MD know daughter's wishes.

## 2015-04-16 NOTE — Progress Notes (Signed)
ANTIBIOTIC CONSULT NOTE  Pharmacy Consult for Vancomycin Indication: AECOPD, history of MRSA in sputum   Allergies  Allergen Reactions  . Asa [Aspirin] Other (See Comments)    Reaction: swelling of the right side.  . Codeine Other (See Comments)    Reaction: Difficulty breathing  . Ibuprofen Other (See Comments)    Reaction: Swelling of the right side.    Patient Measurements: Height: 5\' 10"  (177.8 cm) Weight: 197 lb (89.359 kg) IBW/kg (Calculated) : 73 Adjusted Body Weight: 79.6 kg  Vital Signs: Temp: 97.1 F (36.2 C) (07/29 0800) Temp Source: Axillary (07/29 0800) BP: 112/60 mmHg (07/29 0800) Pulse Rate: 103 (07/29 0800) Intake/Output from previous day: 07/28 0701 - 07/29 0700 In: 1930 [P.O.:1680; IV Piggyback:250] Out: 5503 [Urine:5503] Intake/Output from this shift:    Labs:  Recent Labs  04/14/15 0540 04/15/15 0453 04/16/15 0520  WBC 12.4* 19.3*  --   HGB 14.3 13.7  --   PLT 186 188  --   CREATININE 1.02 1.14 1.09   Estimated Creatinine Clearance: 80.1 mL/min (by C-G formula based on Cr of 1.09).  Recent Labs  04/16/15 0259  [facility] 18     Microbiology: Recent Results (from the past 720 hour(s))  MRSA PCR Screening     Status: None   Collection Time: 04/01/15  1:20 PM  Result Value Ref Range Status   MRSA by PCR NEGATIVE NEGATIVE Final    Comment:        The GeneXpert MRSA Assay (FDA approved for NASAL specimens only), is one component of a comprehensive MRSA colonization surveillance program. It is not intended to diagnose MRSA infection nor to guide or monitor treatment for MRSA infections.   Culture, expectorated sputum-assessment     Status: None   Collection Time: 04/01/15  2:45 PM  Result Value Ref Range Status   Specimen Description SPUTUM  Final   Special Requests Normal  Final   Sputum evaluation THIS SPECIMEN IS ACCEPTABLE FOR SPUTUM CULTURE  Final   Report Status 04/01/2015 FINAL  Final  Culture, respiratory  (NON-Expectorated)     Status: None   Collection Time: 04/01/15  2:45 PM  Result Value Ref Range Status   Specimen Description SPUTUM  Final   Special Requests Normal Reflexed from [id number]  Final   Gram Stain   Final    MANY WBC SEEN RARE GRAM NEGATIVE RODS RARE GRAM POSITIVE COCCI    Culture   Final    RARE GROWTH METHICILLIN RESISTANT STAPHYLOCOCCUS AUREUS MODERATE GROWTH HAEMOPHILUS INFLUENZAE CRITICAL RESULT CALLED TO, READ BACK BY AND VERIFIED WITH: DR. Cherlynn Kaiser AT 0454 04/04/15 CTJ    Report Status 04/05/2015 FINAL  Final   Organism ID, Bacteria METHICILLIN RESISTANT STAPHYLOCOCCUS AUREUS  Final   Organism ID, Bacteria HAEMOPHILUS INFLUENZAE  Final      Susceptibility   Methicillin resistant staphylococcus aureus - MIC*    CIPROFLOXACIN >=8 RESISTANT Resistant     GENTAMICIN <=0.5 SENSITIVE Sensitive     OXACILLIN Value in next row Resistant      >=4 MODERATELY RESISTANT    VANCOMYCIN <=0.5 SENSITIVE Sensitive     TRIMETH/SULFA <=10 SENSITIVE Sensitive     CEFOXITIN SCREEN Value in next row Resistant      POSITIVECEFOXITIN SCREEN - This test may be used to predict mecA-mediated oxacillin resistance, and it is based on the cefoxitin disk screen test.  The cefoxitin screen and oxacillin work in combination to determine the final interpretation reported for oxacillin.  Inducible Clindamycin Value in next row Sensitive      POSITIVECEFOXITIN SCREEN - This test may be used to predict mecA-mediated oxacillin resistance, and it is based on the cefoxitin disk screen test.  The cefoxitin screen and oxacillin work in combination to determine the final interpretation reported for oxacillin.     ERYTHROMYCIN Value in next row Resistant      RESISTANT>=8    TETRACYCLINE Value in next row Sensitive      SENSITIVE<=1    CLINDAMYCIN Value in next row Resistant      RESISTANT>=8    LINEZOLID Value in next row Sensitive      SENSITIVE2    LEVOFLOXACIN Value in next row Resistant       RESISTANT>=8    * RARE GROWTH METHICILLIN RESISTANT STAPHYLOCOCCUS AUREUS  Culture, expectorated sputum-assessment     Status: None   Collection Time: 04/14/15  2:00 PM  Result Value Ref Range Status   Specimen Description SPUTUM  Final   Special Requests NONE  Final   Sputum evaluation THIS SPECIMEN IS ACCEPTABLE FOR SPUTUM CULTURE  Final   Report Status 04/14/2015 FINAL  Final  Culture, respiratory (NON-Expectorated)     Status: None (Preliminary result)   Collection Time: 04/14/15  2:00 PM  Result Value Ref Range Status   Specimen Description SPUTUM  Final   Special Requests NONE Reflexed from [id number]  Final   Gram Stain   Final    FEW WBC SEEN FEW YEAST FEW GRAM POSITIVE COCCI IN CHAINS FAIR SPECIMEN - 70-80% WBCS    Culture HOLDING FOR POSSIBLE PATHOGEN  Final   Report Status PENDING  Incomplete    Medical History: Past Medical History  Diagnosis Date  . Asthma   . COPD (chronic obstructive pulmonary disease)   . Allergy   . Anxiety   . Chronic diastolic CHF (congestive heart failure)     a. echo 07/2013: EF 60-65%, DD, biatrial dilatation, Ao sclerosis, dilated RV, moderate pulmonary HTN, elevated CV and RA pressures; b. patient reported echo at Dr. Milta Deiters office 02/2015 - his office does not have record of him being a pt there   . Depression   . Emphysema of lung   . GERD (gastroesophageal reflux disease)   . Tobacco abuse   . Depression   . Chronic respiratory failure     a. on 2L via nasal cannula; b. secondary to COPD    Medications:  Prescriptions prior to admission  Medication Sig Dispense Refill Last Dose  . Albuterol Sulfate (PROAIR RESPICLICK) 108 (90 BASE) MCG/ACT AEPB Inhale 2 puffs into the lungs every 4 (four) hours as needed (for shortness of breath).   04/13/2015 at Unknown time  . ALPRAZolam (XANAX) 1 MG tablet Take 0.5-1 mg by mouth 3 (three) times daily as needed for anxiety.   04/13/2015 at Unknown time  . diltiazem (CARDIZEM) 120 MG tablet Take 120  mg by mouth daily.   04/13/2015 at Unknown time  . doxazosin (CARDURA) 4 MG tablet Take 4 mg by mouth daily.   04/13/2015 at Unknown time  . escitalopram (LEXAPRO) 20 MG tablet Take 20 mg by mouth daily.   04/13/2015 at Unknown time  . gabapentin (NEURONTIN) 100 MG capsule Take 200 mg by mouth 3 (three) times daily.   04/13/2015 at Unknown time  . ipratropium-albuterol (DUONEB) 0.5-2.5 (3) MG/3ML SOLN Take 3 mLs by nebulization 4 (four) times daily as needed (for shortness of breath).   04/13/2015 at Unknown time  .  levofloxacin (LEVAQUIN) 500 MG tablet Take 1 tablet (500 mg total) by mouth daily. 10 tablet 0 04/13/2015 at Unknown time  . mometasone-formoterol (DULERA) 100-5 MCG/ACT AERO Inhale 2 puffs into the lungs 2 (two) times daily. 1 Inhaler 2 04/13/2015 at Unknown time  . predniSONE (DELTASONE) 10 MG tablet Label  & dispense according to the schedule below. 5 Pills PO for 2 days then, 4 Pills PO for 2 days, 3 Pills PO for 2 days, 2 Pills PO for 2 days, 1 Pill PO for 2 days then STOP. 30 tablet 0 04/13/2015 at Unknown time  . sulfamethoxazole-trimethoprim (BACTRIM DS,SEPTRA DS) 800-160 MG per tablet Take 1 tablet by mouth 2 (two) times daily. 20 tablet 0 04/13/2015 at Unknown time  . theophylline (THEO-24) 400 MG 24 hr capsule Take 400 mg by mouth daily.   04/13/2015 at Unknown time   Assessment: Pharmacy consulted to dose vancomycin for AECOPD in this 59 year old male. Patient with recent sputum culture (7/14) with MRSA and h. Flu, treated with bactrim & levaquin.   Azithromycin added 7/27  Pk parameters (calculated): CrCl = 71.6 ml/min Ke = 0.06 hr-1 T1/2 = 11.5 hrs Vd = 62.6 L  Goal of Therapy:  Vancomycin trough 15-62mcg/ml  Plan:  Current orders for vancomycin 1250 IV Q12H. Trough this morning therapeutic, renal function stable.  Will continue current regimen and follow renal function closely as patient being diuresed. Will recheck trough Monday 8/1.  Garlon Hatchet, PharmD Clinical  Pharmacist  04/16/2015 2:58 PM

## 2015-04-17 DIAGNOSIS — J441 Chronic obstructive pulmonary disease with (acute) exacerbation: Principal | ICD-10-CM

## 2015-04-17 DIAGNOSIS — I5033 Acute on chronic diastolic (congestive) heart failure: Secondary | ICD-10-CM | POA: Insufficient documentation

## 2015-04-17 LAB — BRAIN NATRIURETIC PEPTIDE: B Natriuretic Peptide: 92 pg/mL (ref 0.0–100.0)

## 2015-04-17 NOTE — Progress Notes (Signed)
[facility] [place] [facility]   PATIENT NAME: Dustin [name]    MR#:  [id number]  DATE OF BIRTH:  09-29-1955  SUBJECTIVE:  CHIEF COMPLAINT:   Chief Complaint  Patient presents with  . Shortness of Breath  cough, SOB and wheezing,  Still on 5 liters O2  REVIEW OF SYSTEMS:   Review of Systems  Constitutional: Negative for fever and chills.  HENT: Negative for congestion and tinnitus.   Eyes: Negative for blurred vision and double vision.  Respiratory: Positive for cough, sputum production, shortness of breath and wheezing.   Cardiovascular: Negative for chest pain, orthopnea and PND.  Gastrointestinal: Negative for nausea, vomiting, abdominal pain and diarrhea.  Genitourinary: Negative for dysuria and hematuria.  Neurological: Negative for dizziness, sensory change and focal weakness.  All other systems reviewed and are negative.  Nutrition: Heart Healthy Tolerating Diet: Yes DRUG ALLERGIES:   Allergies  Allergen Reactions  . Asa [Aspirin] Other (See Comments)    Reaction: swelling of the right side.  . Codeine Other (See Comments)    Reaction: Difficulty breathing  . Ibuprofen Other (See Comments)    Reaction: Swelling of the right side.   VITALS:  Blood pressure 132/78, pulse 110, temperature 96.7 F (35.9 C), temperature source Oral, resp. rate 18, height 5\' 10"  (1.778 m), weight 89.359 kg (197 lb), SpO2 90 %. PHYSICAL EXAMINATION:  Physical Exam  GENERAL:  59 y.o.-year-old patient lying in the bed in NAD.  EYES: Pupils equal, round, reactive to light and accommodation. No scleral icterus. Extraocular muscles intact.  HEENT: Head atraumatic, normocephalic. Oropharynx and nasopharynx clear.  NECK:  Supple, no jugular venous distention. No thyroid enlargement, no tenderness.  LUNGS: Prolonged inspiratory & Exp. Phase. Has end-exp. wheezing. No use of accessory muscles of respiration. No dullness to percussion.  CARDIOVASCULAR: S1, S2  normal. No murmurs, rubs, or gallops.  ABDOMEN: Soft, nontender, nondistended. Bowel sounds present. No organomegaly or mass.  EXTREMITIES: No cyanosis, clubbing or edema b/l.    NEUROLOGIC: Cranial nerves II through XII are intact. No focal Motor or sensory deficits b/l.   PSYCHIATRIC: The patient is alert and oriented x 3. Good Affect.  SKIN: No obvious rash, lesion, or ulcer.  LABORATORY PANEL:   CBC  Recent Labs Lab 04/15/15 0453  WBC 19.3*  HGB 13.7  HCT 42.1  PLT 188   ------------------------------------------------------------------------------------------------------------------  Chemistries   Recent Labs Lab 04/13/15 1152  04/14/15 1430  04/16/15 0520  NA 133*  < >  --   < > 138  K 5.0  < >  --   < > 3.8  CL 97*  < >  --   < > 94*  CO2 28  < >  --   < > 34*  GLUCOSE 100*  < >  --   < > 124*  BUN 16  < >  --   < > 34*  CREATININE 1.22  < >  --   < > 1.09  CALCIUM 8.4*  < >  --   < > 8.7*  MG  --   --  2.2  --   --   AST 17  --   --   --   --   ALT 21  --   --   --   --   ALKPHOS 61  --   --   --   --   BILITOT 1.0  --   --   --   --   < > =  values in this interval not displayed. ASSESSMENT AND PLAN:   59 year old male with past medical history of COPD, anxiety, CHF, depression, GERD who presented to the hospital due to shortness of breath and noted to be in acute on chronic respiratory failure due to COPD exacerbation.  #1 Acute on chronic respiratory failure due to COPD exacerbation-continue IV steroids, DuoNeb nebs around-the-clock,Spiriva, Pulmocort nebs, theophylline.  -Patient is on oxygen at home at 2 L. - O2 still at 5 L Today, try to wean  on Vanco for MRSA in the sputum and zithromax for atypical coverage - appreciate pulmo input - if no improvement - may need deep culture or bronch to r/o any other etiology.   #2 COPD exacerbation- continue Solumedrol, duonebs, Theophylline, Spiriva.  - cont. IV Vanco, Zithromax, follows w/ Dr. Freda Munro as  outpatient.  #3 Pulmonary nodule- recommended CT scan of the chest in 6 months to ensure stability. Likely in Dec'16  #4 Acute on chronic diastolic CHF- continue IV Lasix 40 mg BID for now, appreciate cardio input. Most recent echo from July 03/2015 with normal LV function and elevated right sided pressures  #5 Hypertension essential - continue Cardizem  #6 Depression/Anxiety-continue low-dose Lexapro, also on alprazolam  #7 Generalized Weakness- seen by physical therapy and recommended home health services.    #8 Nicotine addiction again smoking cessation done by admitting Dr - recommend that he stop smoking 4 minutes spent to be started on a nicotine patch    #9 Neuropathy:doing much better on 400 mg (2 tabs of 200 mg) TID of gabapentin.   All the records are reviewed and case discussed with Care Management/Social Worker. Management plans discussed with the patient, family (his sister from [place] called nursing last evening wanting Dr Windell Hummingbird group consulted )and they are in agreement.  CODE STATUS: Full  DVT Prophylaxis: Heparin subcutaneous  TOTAL TIME TAKING CARE OF THIS PATIENT: 42 minutes.   More than 50% of the time was spent in counseling/coordination of care: YES  POSSIBLE D/C in 2-3 days., DEPENDING ON CLINICAL CONDITION.    Shaune Pollack M.D on 04/17/2015 at 1:36 PM  Between 7am to 6pm - Pager - [phone]  After 6pm go to [email or web address] - password [email or web address] [facility]  [place] [facility]  Office  [phone]  CC: Primary care physician; Lyndon Code, MD

## 2015-04-17 NOTE — Evaluation (Signed)
Physical Therapy Evaluation Patient Details Name: Dustin [name] MRN: [id number] DOB: 14-Aug-1956 Today's Date: 04/17/2015   History of Present Illness  Dustin [name] is a 59 y.o. male with a known history of COPD, chronic respiratory failure 2 L has Baseline presenting with shortness of breath. 4-5 day duration of worsening shortness of breath mainly described as dyspnea on exertion with associated cough, productive greenish sputum denies any fevers or chills or chest pain. currently on 5L O2 maintaining 90% O2 Sat.   Clinical Impression  PT presents with chronic respiratory failure, COPD with history of home O2 use (2L) with readmission to the hospital with continued SOB, productive cough (+MRSA). Pt demonstrates significant impairment of mobility and function due to poor endurance and poor O2 saturation with activity. Pt performed stand pivot transfer with RW to the recliner with +1CGA today, desaturation to 81% following transfer, but was able to recover after 2-3 min. Currently on 5L O2. Pt would benefit from continued skilled PT services to address strength, endurance, transfers to maximize mobility.     Follow Up Recommendations SNF    Equipment Recommendations  None recommended by PT;Other (comment)    Recommendations for Other Services       Precautions / Restrictions        Mobility  Bed Mobility Overal bed mobility: Modified Independent             General bed mobility comments: slow paced, uses rails  Transfers Overall transfer level: Modified independent Equipment used: Rolling walker (2 wheeled) Transfers: Sit to/from [employer] Sit to Stand: Modified independent (Device/Increase time) Stand pivot transfers: Modified independent (Device/Increase time)       General transfer comment: CGA for sit to stand and stand pivot to the recliner with RW. pt desaturated to 81% during transfer. ~2.5 min recovery to 89% - cues for pursed lip breathing.    Ambulation/Gait Ambulation/Gait assistance:  (did not ambulate today due to increased SOB and desaturation)              Stairs            Wheelchair Mobility    Modified Rankin (Stroke Patients Only)       Balance Overall balance assessment: Modified Independent (steady with walker)                                           Pertinent Vitals/Pain Pain Assessment: No/denies pain    Home Living                        Prior Function                 Hand Dominance        Extremity/Trunk Assessment   Upper Extremity Assessment: Overall WFL for tasks assessed    does have impaired sensation to the feet        Lower Extremity Assessment: Generalized weakness         Communication      Cognition Arousal/Alertness: Awake/alert Behavior During Therapy: WFL for tasks assessed/performed Overall Cognitive Status: Within Functional Limits for tasks assessed                      General Comments      Exercises Other Exercises Other Exercises: ankle pumps x 15  Assessment/Plan    PT Assessment Patient needs continued PT services  PT Diagnosis Generalized weakness   PT Problem List Decreased activity tolerance;Decreased strength;Decreased balance;Cardiopulmonary status limiting activity;Decreased mobility;Impaired sensation  PT Treatment Interventions Gait training;Functional mobility training;Therapeutic activities;Therapeutic exercise;Stair training   PT Goals (Current goals can be found in the Care Plan section) Acute Rehab PT Goals Patient Stated Goal: regain strength and endurance  PT Goal Formulation: With patient Time For Goal Achievement: 05/01/15 Potential to Achieve Goals: Fair    Frequency Min 2X/week   Barriers to discharge        Co-evaluation               End of Session Equipment Utilized During Treatment: Gait belt;Oxygen Activity Tolerance: Patient limited by fatigue  (desaturation to 81%) Patient left: in chair;with chair alarm set;with call bell/phone within reach;with SCD's reapplied           Time: 1245-1315 PT Time Calculation (min) (ACUTE ONLY): 30 min   Charges:   PT Evaluation $Initial PT Evaluation Tier I: 1 Procedure     PT G Codes:       Hollyann Pablo C. Cartina Brousseau, PT, DPT [phone]  Yassir Enis 04/17/2015, 1:36 PM

## 2015-04-17 NOTE — Progress Notes (Addendum)
Patient: Dustin [name] / Admit Date: 04/13/2015 / Date of Encounter: 04/17/2015, 12:11 PM   Subjective: Feels about the same. Tried to get out of bed on 7/29, felt too weak. Would like to move to the recliner today. Received fax from Dr. Milta Deiters office on 7/29 with prior echo from early July that showed normal LV function and elevated right sided pressures. BNP this morning 92.0.   Review of Systems: Review of Systems  Constitutional: Positive for weight loss and malaise/fatigue. Negative for fever, chills and diaphoresis.  HENT: Positive for congestion.   Eyes: Negative for blurred vision, discharge and redness.  Respiratory: Positive for cough, sputum production, shortness of breath and wheezing. Negative for hemoptysis.        White sputum   Cardiovascular: Negative for chest pain, palpitations, orthopnea, claudication, leg swelling and PND.  Gastrointestinal: Negative for heartburn, nausea, vomiting, abdominal pain and melena.  Genitourinary: Negative for hematuria.  Skin: Negative for rash.  Neurological: Positive for weakness and headaches. Negative for sensory change, speech change and focal weakness.  Endo/Heme/Allergies: Does not bruise/bleed easily.  Psychiatric/Behavioral: The patient is not nervous/anxious.   All other systems reviewed and are negative.    Objective: Telemetry: not on tele Physical Exam: Blood pressure 132/78, pulse 97, temperature 96.7 F (35.9 C), temperature source Oral, resp. rate 18, height  (1.778 m), weight 197 lb (89.359 kg), SpO2 90 %. Body mass index is 28.27 kg/(m^2). General: Well developed, well nourished, in no acute distress. Head: Normocephalic, atraumatic, sclera non-icteric, no xanthomas, nares are without discharge. Neck: Negative for carotid bruits. JVP not elevated. Lungs: Bilateral wheezing with coarse breath sounds. Breathing is unlabored. Heart: RRR S1 S2 without murmurs, rubs, or gallops.  Abdomen: Soft, non-tender,  non-distended with normoactive bowel sounds. No rebound/guarding. Extremities: No clubbing or cyanosis. No edema. Distal pedal pulses are 2+ and equal bilaterally. Neuro: Alert and oriented X 3. Moves all extremities spontaneously. Psych:  Responds to questions appropriately with a normal affect.   Intake/Output Summary (Last 24 hours) at 04/17/15 1211 Last data filed at 04/17/15 0844  Gross per 24 hour  Intake    240 ml  Output   2340 ml  Net  -2100 ml    Inpatient Medications:  . acidophilus  1 capsule Oral Daily  . albuterol  2.5 mg Nebulization Q4H  . azithromycin  500 mg Oral Daily  . diltiazem  120 mg Oral Daily  . doxazosin  4 mg Oral Daily  . enoxaparin (LOVENOX) injection  40 mg Subcutaneous Q24H  . escitalopram  20 mg Oral Daily  . furosemide  40 mg Oral BID  . gabapentin  400 mg Oral TID  . guaiFENesin  600 mg Oral BID  . methylPREDNISolone (SOLU-MEDROL) injection  60 mg Intravenous Q6H  . mometasone-formoterol  2 puff Inhalation BID  . nicotine  21 mg Transdermal Daily  . potassium chloride  10 mEq Oral BID  . senna-docusate  2 tablet Oral BID  . sodium chloride  3 mL Intravenous Q12H  . sodium chloride  3 mL Intravenous Q12H  . theophylline  400 mg Oral Daily  . tiotropium  18 mcg Inhalation Daily  . vancomycin  1,250 mg Intravenous Q12H   Infusions:    Labs:  Recent Labs  04/14/15 1430 04/15/15 0453 04/16/15 0520  NA  --  139 138  K  --  4.0 3.8  CL  --  98* 94*  CO2  --  33* 34*  GLUCOSE  --  160* 124*  BUN  --  30* 34*  CREATININE  --  1.14 1.09  CALCIUM  --  9.0 8.7*  MG 2.2  --   --    No results for input(s): AST, ALT, ALKPHOS, BILITOT, PROT, ALBUMIN in the last 72 hours.  Recent Labs  04/15/15 0453  WBC 19.3*  HGB 13.7  HCT 42.1  MCV 95.8  PLT 188   No results for input(s): CKTOTAL, CKMB, TROPONINI in the last 72 hours. Invalid input(s): POCBNP No results for input(s): HGBA1C in the last 72 hours.   Weights: Filed Weights    04/13/15 1122  Weight: 197 lb (89.359 kg)     Radiology/Studies:  Dg Chest 2 View  04/02/2015   CLINICAL DATA:  Shortness of breath.  EXAM: CHEST  2 VIEW  COMPARISON:  March 31, 2015.  FINDINGS: No pneumothorax is noted. Stable cardiomediastinal silhouette. Increased bibasilar opacities are noted concerning for probable bilateral pleural effusions with associated atelectasis or infiltrate. Bony thorax is intact.  IMPRESSION: Mild bibasilar opacities are noted concerning for probable pleural effusions with associated atelectasis or infiltrate. Follow-up radiographs are recommended.   Electronically Signed   By: Lupita Raider, M.D.   On: 04/02/2015 13:54   Dg Chest 2 View  03/31/2015   CLINICAL DATA:  Worsening shortness of breath and weakness for past 2 days. Hypoxia. COPD.  EXAM: CHEST  2 VIEW  COMPARISON:  09/02/2014  FINDINGS: Pulmonary hyperinflation and diffuse interstitial prominence show no significant change, consistent with COPD. No focal areas of pulmonary consolidation or pleural effusion identified. Heart size is within normal limits. No hilar or mediastinal masses identified. Multiple surgical clips again seen in the left upper quadrant.  IMPRESSION: Stable exam.  COPD.  No acute findings.   Electronically Signed   By: Myles Rosenthal M.D.   On: 03/31/2015 17:55   Dg Chest Portable 1 View  04/13/2015   CLINICAL DATA:  Altered mental status and hypotension today.  EXAM: PORTABLE CHEST - 1 VIEW  COMPARISON:  04/02/2015  FINDINGS: Cardiomegaly and COPD/ emphysema changes again noted.  Mild bibasilar atelectasis/scarring again noted.  There is no evidence of focal airspace disease, pulmonary edema, suspicious pulmonary nodule/mass, pleural effusion, or pneumothorax.  No acute bony abnormalities are identified.  IMPRESSION: No significant change from the prior study. Unchanged mild bibasilar atelectasis/scarring.  Cardiomegaly and COPD/emphysema.   Electronically Signed   By: Harmon Pier M.D.   On:  04/13/2015 12:20     Assessment and Plan  59 y.o. male with h/o chronic respiratory failure on 2L via nasal cannula secondary to COPD, chronic diastolic CHF/pulmonary HTN, asthma, history of pulmonary nodule with small pericardial effusion, ongoing tobacco abuse with reported recent cessation, GERD, anxiety, and depression who was recently admitted to [facility] from 7/13 to 7/18 for acute on chronic respiratory failure with hypoxia secondary to COPD exacerbation returned to [facility] on 7/26 with increased dyspnea, COPD exacerbation, and acute on chronic diastolic CHF.   1. Acute on chronic respiratory failure with hypoxia: -Likely multifactorial in the setting of COPD exacerbation and possibly acute on chronic diastolic CHF, though mostly COPD exacerbation -Baseline oxygen requirement increased from 2L via nasal cannula to 5L via nasal cannula  -On vancomycin 2/2 MRSA in sputum and azithromycin for atypical coverage  -No signs of hospital acquired PNA at this time -Consider early bronch if no improvement  -On DuoNebs, Spiriva, Pulmocort, theophylline per primary team  -Repeat sputum culture pending -  Wean oxygen as able  2. Possible mild acute on chronic diastolic CHF/pulmonary HTN: -Continue po Lasix 40 mg bid -Minus 9852 mL for the admission to date of consult  -SCr stable -Echo from 07/2013 as above -Most recent echo from July 03/2015 with normal LV function and elevated right sided pressures -No beta blocker at this time given acute exacerbation  3. COPD exacerbation: -Per #1  4. HTN: -Continue Cardizem CD 120 mg daily  5. Pulmonary nodule: -Will need repeat study in 08/2015  6. Ongoing tobacco abuse: -Reports recent cessation, though unable to tell me when he actually quit -Hazardous to smoke while on O2  7. Depression/anxiety: -Continue current treatment  8. Hypokalemia: -Replete to 4.0   Elinor Dodge, PA-C Pager: [phone] 04/17/2015, 12:11 PM    Attending  Note Patient seen and examined, agree with detailed note above,  Patient presentation and plan discussed on rounds.   Improved shortness of breath, using incentive spirometer Currently on Lasix 40 mg twice a day, stable renal function Still with thick cough, nonproductive No further cardiac studies needed, recent echocardiogram with normal LV function , elevated right heart pressures --Would continue gentle diuresis again with close monitoring of renal function Decrease Lasix dosing for any climb in creatinine.  --BUN has started to climb. We'll need to monitor closely  Signed: Dossie Arbour  M.D., Ph.D.

## 2015-04-18 LAB — BASIC METABOLIC PANEL
Anion gap: 8 (ref 5–15)
BUN: 33 mg/dL — ABNORMAL HIGH (ref 6–20)
CO2: 33 mmol/L — ABNORMAL HIGH (ref 22–32)
CREATININE: 1.03 mg/dL (ref 0.61–1.24)
Calcium: 8.7 mg/dL — ABNORMAL LOW (ref 8.9–10.3)
Chloride: 97 mmol/L — ABNORMAL LOW (ref 101–111)
GFR calc Af Amer: >60 mL/min (ref >60)
GFR calc non Af Amer: >60 mL/min (ref >60)
GLUCOSE: 205 mg/dL — AB (ref 65–99)
Potassium: 4.1 mmol/L (ref 3.5–5.1)
Sodium: 138 mmol/L (ref 135–145)

## 2015-04-18 LAB — MAGNESIUM: Magnesium: 2.2 mg/dL (ref 1.7–2.4)

## 2015-04-18 LAB — CBC
HEMATOCRIT: 42.9 % (ref 40.0–52.0)
Hemoglobin: 14 g/dL (ref 13.0–18.0)
MCH: 31.5 pg (ref 26.0–34.0)
MCHC: 32.7 g/dL (ref 32.0–36.0)
MCV: 96.4 fL (ref 80.0–100.0)
Platelets: 167 10*3/uL (ref 150–440)
RBC: 4.45 MIL/uL (ref 4.40–5.90)
RDW: 14.8 % — ABNORMAL HIGH (ref 11.5–14.5)
WBC: 12.4 10*3/uL — ABNORMAL HIGH (ref 3.8–10.6)

## 2015-04-18 MED ORDER — BISACODYL 5 MG PO TBEC
10.0000 mg | DELAYED_RELEASE_TABLET | Freq: Every day | ORAL | Status: DC | PRN
  Administered 2015-04-18: 10 mg via ORAL
  Filled 2015-04-18: qty 2

## 2015-04-18 NOTE — Progress Notes (Signed)
[facility] - [place] at [facility]   PATIENT NAME: Dustin [name]    MR#:  [id number]  DATE OF BIRTH:  September 13, 1956  SUBJECTIVE:  CHIEF COMPLAINT:   Chief Complaint  Patient presents with  . Shortness of Breath  still cough, SOB and wheezing,  Still on 5 liters O2  REVIEW OF SYSTEMS:   Review of Systems  Constitutional: Negative for fever and chills.  HENT: Negative for congestion and tinnitus.   Eyes: Negative for blurred vision and double vision.  Respiratory: Positive for cough, sputum production, shortness of breath and wheezing.   Cardiovascular: Negative for chest pain, orthopnea and PND.  Gastrointestinal: Negative for nausea, vomiting, abdominal pain and diarrhea.  Genitourinary: Negative for dysuria and hematuria.  Neurological: Negative for dizziness, sensory change and focal weakness.  All other systems reviewed and are negative.  Nutrition: Heart Healthy Tolerating Diet: Yes DRUG ALLERGIES:   Allergies  Allergen Reactions  . Asa [Aspirin] Other (See Comments)    Reaction: swelling of the right side.  . Codeine Other (See Comments)    Reaction: Difficulty breathing  . Ibuprofen Other (See Comments)    Reaction: Swelling of the right side.   VITALS:  Blood pressure 132/60, pulse 99, temperature 96.7 F (35.9 C), temperature source Oral, resp. rate 18, height 5\' 10"  (1.778 m), weight 89.359 kg (197 lb), SpO2 90 %. PHYSICAL EXAMINATION:  Physical Exam  GENERAL:  59 y.o.-year-old patient lying in the bed in NAD.  EYES: Pupils equal, round, reactive to light and accommodation. No scleral icterus. Extraocular muscles intact.  HEENT: Head atraumatic, normocephalic. Oropharynx and nasopharynx clear.  NECK:  Supple, no jugular venous distention. No thyroid enlargement, no tenderness.  LUNGS: Prolonged inspiratory & Exp. Phase. Has end-exp. wheezing. No use of accessory muscles of respiration. No dullness to percussion.  CARDIOVASCULAR: S1, S2  normal. No murmurs, rubs, or gallops.  ABDOMEN: Soft, nontender, nondistended. Bowel sounds present. No organomegaly or mass.  EXTREMITIES: No cyanosis, clubbing or edema b/l.    NEUROLOGIC: Cranial nerves II through XII are intact. No focal Motor or sensory deficits b/l.   PSYCHIATRIC: The patient is alert and oriented x 3. Good Affect.  SKIN: No obvious rash, lesion, or ulcer.  LABORATORY PANEL:   CBC  Recent Labs Lab 04/18/15 0333  WBC 12.4*  HGB 14.0  HCT 42.9  PLT 167   ------------------------------------------------------------------------------------------------------------------  Chemistries   Recent Labs Lab 04/13/15 1152  04/18/15 0333  NA 133*  < > 138  K 5.0  < > 4.1  CL 97*  < > 97*  CO2 28  < > 33*  GLUCOSE 100*  < > 205*  BUN 16  < > 33*  CREATININE 1.22  < > 1.03  CALCIUM 8.4*  < > 8.7*  MG  --   < > 2.2  AST 17  --   --   ALT 21  --   --   ALKPHOS 61  --   --   BILITOT 1.0  --   --   < > = values in this interval not displayed. ASSESSMENT AND PLAN:   59 year old male with past medical history of COPD, anxiety, CHF, depression, GERD who presented to the hospital due to shortness of breath and noted to be in acute on chronic respiratory failure due to COPD exacerbation.  #1 Acute on chronic respiratory failure due to COPD exacerbation-continue IV solumedrol, Duonebs around-the-clock,Spiriva, Pulmocort nebs, theophylline.  -Patient is on oxygen at home  at 2 L. - O2 still at 5 L Today, try to wean  on Vanco for MRSA in the sputum and zithromax for atypical coverage - appreciate pulmo input - if no improvement - may need deep culture or bronch to r/o any other etiology.   #2 COPD exacerbation- continue Solumedrol, duonebs, Theophylline, Spiriva.  - cont. IV Vanco, Zithromax, follows w/ Dr. Freda Munro as outpatient.  #3 Pulmonary nodule- recommended CT scan of the chest in 6 months to ensure stability. Likely in Dec'16  #4 Acute on chronic  diastolic CHF- continue IV Lasix 40 mg BID for now, appreciate cardio input. Most recent echo from July 03/2015 with normal LV function and elevated right sided pressures  #5 Hypertension essential - continue Cardizem  #6 Depression/Anxiety-continue low-dose Lexapro, also on alprazolam  #7 Generalized Weakness-per physical therapy evaluation, need skilled nursing facility placement.    #8 Nicotine addiction again smoking cessation done by admitting Dr - recommend that he stop smoking 4 minutes spent to be started on a nicotine patch    #9 Neuropathy:doing much better on 400 mg (2 tabs of 200 mg) TID of gabapentin.   All the records are reviewed and case discussed with Care Management/Social Worker. Management plans discussed with the patient, family (his sister from [place] called nursing last evening wanting Dr Windell Hummingbird group consulted )and they are in agreement.  CODE STATUS: Full  DVT Prophylaxis: Heparin subcutaneous  TOTAL TIME TAKING CARE OF THIS PATIENT: 39 minutes.   More than 50% of the time was spent in counseling/coordination of care: YES  POSSIBLE D/C in 2-3 days., DEPENDING ON CLINICAL CONDITION.    Shaune Pollack M.D on 04/18/2015 at 3:09 PM  Between 7am to 6pm - Pager - [phone]  After 6pm go to [email or web address] - password [email or web address] [facility]  [facility]  Office  [phone]  CC: Primary care physician; Lyndon Code, MD

## 2015-04-19 DIAGNOSIS — L8990 Pressure ulcer of unspecified site, unspecified stage: Secondary | ICD-10-CM | POA: Insufficient documentation

## 2015-04-19 LAB — CULTURE, RESPIRATORY

## 2015-04-19 LAB — VANCOMYCIN, TROUGH: VANCOMYCIN TR: 17 ug/mL (ref 10–20)

## 2015-04-19 LAB — CULTURE, RESPIRATORY W GRAM STAIN

## 2015-04-19 MED ORDER — METHYLPREDNISOLONE SODIUM SUCC 125 MG IJ SOLR
60.0000 mg | Freq: Two times a day (BID) | INTRAMUSCULAR | Status: DC
  Administered 2015-04-19 – 2015-04-21 (×4): 60 mg via INTRAVENOUS
  Filled 2015-04-19 (×4): qty 2

## 2015-04-19 NOTE — Progress Notes (Signed)
Patient cooperative with care.  Complains of increased anxiety accompanied with administration of Solumedrol, give Xanax with dose.  Per Dr. Imogene Burn goal is to achieve with oxygen, saturations of 88% prior to discharge.  Weaned from 5 liters to 4 liters.

## 2015-04-19 NOTE — Progress Notes (Signed)
Physical Therapy Treatment Patient Details Name: Dustin [name] MRN: [id number] DOB: 09/12/1956 Today's Date: 04/19/2015    History of Present Illness Dustin [name] is a 59 y.o. male with a known history of COPD, chronic respiratory failure 2 L has Baseline presenting with shortness of breath. 4-5 day duration of worsening shortness of breath mainly described as dyspnea on exertion with associated cough, productive greenish sputum denies any fevers or chills or chest pain. currently on 5L O2 maintaining 90% O2 Sat.     PT Comments    Patient is able to fully participate in PT session today but requires multiple and prolonged rest breaks secondary to poor ventilation and fatigue. Patient required 4-6L of O2 to maintain 84-91% saturation today throughout ambulation and exercise and requires a RW (typically no AD at baseline) for additional balance support secondary to LE weakness/fatigue. Patient is mod I with OOB mobility with RW, however he continues to fatigue quite quickly. Patient would continue to benefit from skilled acute PT services to address his mobility deficits.   Follow Up Recommendations  SNF     Equipment Recommendations   (Will need RW if he were to go home. )    Recommendations for Other Services       Precautions / Restrictions Precautions Precautions: Fall Precaution Comments: Droplet precuations.  Restrictions Weight Bearing Restrictions: No    Mobility  Bed Mobility               General bed mobility comments: Patient received in chair  Transfers Overall transfer level: Modified independent Equipment used: Rolling walker (2 wheeled) Transfers: Sit to/from Stand Sit to Stand: Modified independent (Device/Increase time)         General transfer comment: Patient is able to perform sit to stand with RW and no loss of balance x 2 occassions.   Ambulation/Gait Ambulation/Gait assistance: Modified independent (Device/Increase time) Ambulation  Distance (Feet): 60 Feet (2 bouts to door and back) Assistive device: Rolling walker (2 wheeled) Gait Pattern/deviations: WFL(Within Functional Limits)   Gait velocity interpretation: Below normal speed for age/gender General Gait Details: Patient takes reciprocal step pattern on 6L of O2 (tanks do not have option for 5L) Maintained reasonable O2 levels, though did briefly drop to 84%.    Stairs            Wheelchair Mobility    Modified Rankin (Stroke Patients Only)       Balance Overall balance assessment: Modified Independent (With RW )                                  Cognition Arousal/Alertness: Awake/alert Behavior During Therapy: WFL for tasks assessed/performed Overall Cognitive Status: Within Functional Limits for tasks assessed                      Exercises General Exercises - Lower Extremity Ankle Circles/Pumps: AROM;10 reps;Both Heel Slides: AROM;Both;10 reps Straight Leg Raises: AROM;Both;10 reps    General Comments        Pertinent Vitals/Pain Pain Assessment: No/denies pain    Home Living                      Prior Function            PT Goals (current goals can now be found in the care plan section) Acute Rehab PT Goals Patient Stated Goal: regain strength and endurance  PT  Goal Formulation: With patient Time For Goal Achievement: 05/01/15 Potential to Achieve Goals: Good Progress towards PT goals: Progressing toward goals    Frequency  Min 2X/week    PT Plan Current plan remains appropriate    Co-evaluation             End of Session Equipment Utilized During Treatment: Gait belt;Oxygen Activity Tolerance: Patient limited by fatigue;Patient tolerated treatment well Patient left: in chair;with chair alarm set;with call bell/phone within reach     Time: 1610-9604 PT Time Calculation (min) (ACUTE ONLY): 30 min  Charges:  $Gait Training: 8-22 mins $Therapeutic Exercise: 8-22 mins                     G Codes:     Kerin Ransom, PT, DPT    04/19/2015, 1:33 PM

## 2015-04-19 NOTE — Care Management Important Message (Signed)
Important Message  Patient Details  Name: Dustin [name] MRN: [id number] Date of Birth: 02-04-1956   Medicare Important Message Given:  Yes-third notification given    Collie Siad, RN 04/19/2015, 12:47 PM

## 2015-04-19 NOTE — Care Management (Signed)
Spoke with patient who is concerned that he remains on 5 liters O2 /[place]. He agrees to SNF and wants [facility] or [facility]	[place]. If patient goes to SNF I will no longer be concerned about his electric bill, however I will make sure that this information is passed on to the social worker at SNF.

## 2015-04-19 NOTE — Progress Notes (Addendum)
PT is recommending SNF. Bed search has been started. Clinical Education officer, museum (CSW) met with patient and presented bed offers. Patient chose [facility]. CSW contacted [facility] admissions coordinator at [place] and made him aware of above. Per [facility] patient can be on 4 liters of oxygen. Patient is currently on 5 liters. Plan is for patient to D/C to [facility] when medically stable. CSW will continue to follow and assist as needed.   Blima Rich, Petros [phone]

## 2015-04-19 NOTE — Progress Notes (Signed)
Pt was having issues with constipation in addition to his persistent cough. Huge BM this shift made pt feel better. Still having productive sputum. Up to recliner for length of time, and able to utilize more strength to help with own ADLS, but still winds patient.

## 2015-04-19 NOTE — Progress Notes (Signed)
ANTIBIOTIC CONSULT NOTE  Pharmacy Consult for Vancomycin Indication: AECOPD, history of MRSA in sputum   Allergies  Allergen Reactions  . Asa [Aspirin] Other (See Comments)    Reaction: swelling of the right side.  . Codeine Other (See Comments)    Reaction: Difficulty breathing  . Ibuprofen Other (See Comments)    Reaction: Swelling of the right side.    Patient Measurements: Height: 5\' 10"  (177.8 cm) Weight: 197 lb (89.359 kg) IBW/kg (Calculated) : 73 Adjusted Body Weight: 79.6 kg  Vital Signs: Temp: 95.7 F (35.4 C) (08/01 0759) Temp Source: Oral (08/01 0759) BP: 154/82 mmHg (08/01 0759) Pulse Rate: 107 (08/01 1128) Intake/Output from previous day: 07/31 0701 - 08/01 0700 In: 730 [P.O.:480; IV Piggyback:250] Out: 2000 [Urine:2000] Intake/Output from this shift: Total I/O In: -  Out: 900 [Urine:900]  Labs:  Recent Labs  04/18/15 0333  WBC 12.4*  HGB 14.0  PLT 167  CREATININE 1.03   Estimated Creatinine Clearance: 84.8 mL/min (by C-G formula based on Cr of 1.03).  Recent Labs  04/19/15 1437  VANCOTROUGH 17     Microbiology: Recent Results (from the past 720 hour(s))  MRSA PCR Screening     Status: None   Collection Time: 04/01/15  1:20 PM  Result Value Ref Range Status   MRSA by PCR NEGATIVE NEGATIVE Final    Comment:        The GeneXpert MRSA Assay (FDA approved for NASAL specimens only), is one component of a comprehensive MRSA colonization surveillance program. It is not intended to diagnose MRSA infection nor to guide or monitor treatment for MRSA infections.   Culture, expectorated sputum-assessment     Status: None   Collection Time: 04/01/15  2:45 PM  Result Value Ref Range Status   Specimen Description SPUTUM  Final   Special Requests Normal  Final   Sputum evaluation THIS SPECIMEN IS ACCEPTABLE FOR SPUTUM CULTURE  Final   Report Status 04/01/2015 FINAL  Final  Culture, respiratory (NON-Expectorated)     Status: None   Collection  Time: 04/01/15  2:45 PM  Result Value Ref Range Status   Specimen Description SPUTUM  Final   Special Requests Normal Reflexed from [id number]  Final   Gram Stain   Final    MANY WBC SEEN RARE GRAM NEGATIVE RODS RARE GRAM POSITIVE COCCI    Culture   Final    RARE GROWTH METHICILLIN RESISTANT STAPHYLOCOCCUS AUREUS MODERATE GROWTH HAEMOPHILUS INFLUENZAE CRITICAL RESULT CALLED TO, READ BACK BY AND VERIFIED WITH: DR. Cherlynn Kaiser AT 9147 04/04/15 CTJ    Report Status 04/05/2015 FINAL  Final   Organism ID, Bacteria METHICILLIN RESISTANT STAPHYLOCOCCUS AUREUS  Final   Organism ID, Bacteria HAEMOPHILUS INFLUENZAE  Final      Susceptibility   Methicillin resistant staphylococcus aureus - MIC*    CIPROFLOXACIN >=8 RESISTANT Resistant     GENTAMICIN <=0.5 SENSITIVE Sensitive     OXACILLIN Value in next row Resistant      >=4 MODERATELY RESISTANT    VANCOMYCIN <=0.5 SENSITIVE Sensitive     TRIMETH/SULFA <=10 SENSITIVE Sensitive     CEFOXITIN SCREEN Value in next row Resistant      POSITIVECEFOXITIN SCREEN - This test may be used to predict mecA-mediated oxacillin resistance, and it is based on the cefoxitin disk screen test.  The cefoxitin screen and oxacillin work in combination to determine the final interpretation reported for oxacillin.     Inducible Clindamycin Value in next row Sensitive  POSITIVECEFOXITIN SCREEN - This test may be used to predict mecA-mediated oxacillin resistance, and it is based on the cefoxitin disk screen test.  The cefoxitin screen and oxacillin work in combination to determine the final interpretation reported for oxacillin.     ERYTHROMYCIN Value in next row Resistant      RESISTANT>=8    TETRACYCLINE Value in next row Sensitive      SENSITIVE<=1    CLINDAMYCIN Value in next row Resistant      RESISTANT>=8    LINEZOLID Value in next row Sensitive      SENSITIVE2    LEVOFLOXACIN Value in next row Resistant      RESISTANT>=8    * RARE GROWTH METHICILLIN RESISTANT  STAPHYLOCOCCUS AUREUS  Culture, expectorated sputum-assessment     Status: None   Collection Time: 04/14/15  2:00 PM  Result Value Ref Range Status   Specimen Description SPUTUM  Final   Special Requests NONE  Final   Sputum evaluation THIS SPECIMEN IS ACCEPTABLE FOR SPUTUM CULTURE  Final   Report Status 04/14/2015 FINAL  Final  Culture, respiratory (NON-Expectorated)     Status: None   Collection Time: 04/14/15  2:00 PM  Result Value Ref Range Status   Specimen Description SPUTUM  Final   Special Requests NONE Reflexed from [id number]  Final   Gram Stain   Final    FEW WBC SEEN FEW YEAST FEW GRAM POSITIVE COCCI IN CHAINS FAIR SPECIMEN - 70-80% WBCS    Culture   Final    LIGHT GROWTH RAHNELLA AQUATILIS RAHNELLA IS RARELY PATHOGENIC TO HUMANS. CALL MICROBIOLOGY LAB IF SENSITIVITIES ARE REQUIRED.    Report Status 04/19/2015 FINAL  Final    Medical History: Past Medical History  Diagnosis Date  . Asthma   . COPD (chronic obstructive pulmonary disease)   . Allergy   . Anxiety   . Chronic diastolic CHF (congestive heart failure)     a. echo 07/2013: EF 60-65%, DD, biatrial dilatation, Ao sclerosis, dilated RV, moderate pulmonary HTN, elevated CV and RA pressures; b. patient reported echo at Dr. Milta Deiters office 02/2015 - his office does not have record of him being a pt there   . Depression   . Emphysema of lung   . GERD (gastroesophageal reflux disease)   . Tobacco abuse   . Depression   . Chronic respiratory failure     a. on 2L via nasal cannula; b. secondary to COPD    Medications:  Prescriptions prior to admission  Medication Sig Dispense Refill Last Dose  . Albuterol Sulfate (PROAIR RESPICLICK) 108 (90 BASE) MCG/ACT AEPB Inhale 2 puffs into the lungs every 4 (four) hours as needed (for shortness of breath).   04/13/2015 at Unknown time  . ALPRAZolam (XANAX) 1 MG tablet Take 0.5-1 mg by mouth 3 (three) times daily as needed for anxiety.   04/13/2015 at Unknown time  .  diltiazem (CARDIZEM) 120 MG tablet Take 120 mg by mouth daily.   04/13/2015 at Unknown time  . doxazosin (CARDURA) 4 MG tablet Take 4 mg by mouth daily.   04/13/2015 at Unknown time  . escitalopram (LEXAPRO) 20 MG tablet Take 20 mg by mouth daily.   04/13/2015 at Unknown time  . gabapentin (NEURONTIN) 100 MG capsule Take 200 mg by mouth 3 (three) times daily.   04/13/2015 at Unknown time  . ipratropium-albuterol (DUONEB) 0.5-2.5 (3) MG/3ML SOLN Take 3 mLs by nebulization 4 (four) times daily as needed (for shortness of breath).  04/13/2015 at Unknown time  . levofloxacin (LEVAQUIN) 500 MG tablet Take 1 tablet (500 mg total) by mouth daily. 10 tablet 0 04/13/2015 at Unknown time  . mometasone-formoterol (DULERA) 100-5 MCG/ACT AERO Inhale 2 puffs into the lungs 2 (two) times daily. 1 Inhaler 2 04/13/2015 at Unknown time  . predniSONE (DELTASONE) 10 MG tablet Label  & dispense according to the schedule below. 5 Pills PO for 2 days then, 4 Pills PO for 2 days, 3 Pills PO for 2 days, 2 Pills PO for 2 days, 1 Pill PO for 2 days then STOP. 30 tablet 0 04/13/2015 at Unknown time  . sulfamethoxazole-trimethoprim (BACTRIM DS,SEPTRA DS) 800-160 MG per tablet Take 1 tablet by mouth 2 (two) times daily. 20 tablet 0 04/13/2015 at Unknown time  . theophylline (THEO-24) 400 MG 24 hr capsule Take 400 mg by mouth daily.   04/13/2015 at Unknown time   Assessment: Pharmacy consulted to dose vancomycin for AECOPD in this 59 year old male. Patient with recent sputum culture (7/14) with MRSA and h. Flu, treated with bactrim & levaquin.   Azithromycin added 7/27  Pk parameters (calculated): CrCl = 71.6 ml/min Ke = 0.06 hr-1 T1/2 = 11.5 hrs Vd = 62.6 L  Goal of Therapy:  Vancomycin trough 15-40mcg/ml  Plan:  Current orders for vancomycin 1250 IV Q12H. Renal function remains stable, will recheck trough this afternoon.   8/1 at 14:37 Trough = 17 mcg/ml.  Continue current dosing.   Stormy Card [facility] Clinical  Pharmacist  04/19/2015 4:25 PM

## 2015-04-19 NOTE — Progress Notes (Signed)
[facility] - [place] at [facility]   PATIENT NAME: Maliq Pilley    MR#:  [id number]  DATE OF BIRTH:  03/31/1956  SUBJECTIVE:  CHIEF COMPLAINT:   Chief Complaint  Patient presents with  . Shortness of Breath  still cough, SOB and wheezing,  Still on 5 liters O2  REVIEW OF SYSTEMS:   Review of Systems  Constitutional: Negative for fever and chills.  HENT: Negative for congestion and tinnitus.   Eyes: Negative for blurred vision and double vision.  Respiratory: Positive for cough, sputum production, shortness of breath and wheezing.   Cardiovascular: Negative for chest pain, orthopnea and PND.  Gastrointestinal: Negative for nausea, vomiting, abdominal pain and diarrhea.  Genitourinary: Negative for dysuria and hematuria.  Neurological: Negative for dizziness, sensory change and focal weakness.  All other systems reviewed and are negative.  Nutrition: Heart Healthy Tolerating Diet: Yes DRUG ALLERGIES:   Allergies  Allergen Reactions  . Asa [Aspirin] Other (See Comments)    Reaction: swelling of the right side.  . Codeine Other (See Comments)    Reaction: Difficulty breathing  . Ibuprofen Other (See Comments)    Reaction: Swelling of the right side.   VITALS:  Blood pressure 154/82, pulse 102, temperature 95.7 F (35.4 C), temperature source Oral, resp. rate 24, height 5\' 10"  (1.778 m), weight 89.359 kg (197 lb), SpO2 91 %. PHYSICAL EXAMINATION:  Physical Exam  GENERAL:  59 y.o.-year-old patient lying in the bed in NAD.  EYES: Pupils equal, round, reactive to light and accommodation. No scleral icterus. Extraocular muscles intact.  HEENT: Head atraumatic, normocephalic. Oropharynx and nasopharynx clear.  NECK:  Supple, no jugular venous distention. No thyroid enlargement, no tenderness.  LUNGS: Very diminished lung sounds and mild expiratory wheezing. No use of accessory muscles of respiration. No dullness to percussion.  CARDIOVASCULAR: S1, S2  normal. No murmurs, rubs, or gallops.  ABDOMEN: Soft, nontender, nondistended. Bowel sounds present. No organomegaly or mass.  EXTREMITIES: No cyanosis, clubbing or edema b/l.    NEUROLOGIC: Cranial nerves II through XII are intact. No focal Motor or sensory deficits b/l.   PSYCHIATRIC: The patient is alert and oriented x 3. SKIN: No obvious rash, lesion, or ulcer.  LABORATORY PANEL:   CBC  Recent Labs Lab 04/18/15 0333  WBC 12.4*  HGB 14.0  HCT 42.9  PLT 167   ------------------------------------------------------------------------------------------------------------------  Chemistries   Recent Labs Lab 04/13/15 1152  04/18/15 0333  NA 133*  < > 138  K 5.0  < > 4.1  CL 97*  < > 97*  CO2 28  < > 33*  GLUCOSE 100*  < > 205*  BUN 16  < > 33*  CREATININE 1.22  < > 1.03  CALCIUM 8.4*  < > 8.7*  MG  --   < > 2.2  AST 17  --   --   ALT 21  --   --   ALKPHOS 61  --   --   BILITOT 1.0  --   --   < > = values in this interval not displayed. ASSESSMENT AND PLAN:   59 year old male with past medical history of COPD, anxiety, CHF, depression, GERD who presented to the hospital due to shortness of breath and noted to be in acute on chronic respiratory failure due to COPD exacerbation.  #1 Acute on chronic respiratory failure due to COPD exacerbation-taper IV solumedrol, Duonebs around-the-clock,Spiriva, Pulmocort nebs, theophylline.  -Patient is on oxygen at home at 2 L.  Still on O2 [place] 5L, try to wean  on Vanco for MRSA in the sputum and zithromax for atypical coverage - appreciate pulmo input - if no improvement - may need deep culture or bronch to r/o any other etiology.   #2 COPD exacerbation- continue Solumedrol, duonebs, Theophylline, Spiriva.  - continue Vanco, Zithromax, follows w/ Dr. Freda Munro as outpatient.  #3 Pulmonary nodule- recommended CT scan of the chest in 6 months to ensure stability. Likely in Dec'16  #4 Acute on chronic diastolic CHF- continue IV  Lasix 40 mg BID for now, appreciate cardio input. Most recent echo from July 03/2015 with normal LV function and elevated right sided pressures  #5 Hypertension essential - continue Cardizem  #6 Depression/Anxiety-continue low-dose Lexapro, also on alprazolam  #7 Generalized Weakness-per physical therapy evaluation, need skilled nursing facility placement.    #8 Nicotine addiction again smoking cessation done by admitting Dr - recommend that he stop smoking 4 minutes spent to be started on a nicotine patch    #9 Neuropathy:doing much better on 400 mg (2 tabs of 200 mg) TID of gabapentin.   All the records are reviewed and case discussed with Care Management/Social Worker. Management plans discussed with the patient, family (his sister from [place] called nursing last evening wanting Dr Windell Hummingbird group consulted )and they are in agreement.  CODE STATUS: Full  DVT Prophylaxis: Heparin subcutaneous  TOTAL TIME TAKING CARE OF THIS PATIENT: 38 minutes.   More than 50% of the time was spent in counseling/coordination of care: YES  POSSIBLE D/C in 2-3 days., DEPENDING ON CLINICAL CONDITION.    Shaune Pollack M.D on 04/19/2015 at 11:52 AM  Between 7am to 6pm - Pager - [phone]  After 6pm go to [email or web address] - password [email or web address] [facility]  [facility]  Office  [phone]  CC: Primary care physician; Lyndon Code, MD

## 2015-04-19 NOTE — Progress Notes (Signed)
ANTIBIOTIC CONSULT NOTE  Pharmacy Consult for Vancomycin Indication: AECOPD, history of MRSA in sputum   Allergies  Allergen Reactions  . Asa [Aspirin] Other (See Comments)    Reaction: swelling of the right side.  . Codeine Other (See Comments)    Reaction: Difficulty breathing  . Ibuprofen Other (See Comments)    Reaction: Swelling of the right side.    Patient Measurements: Height: 5\' 10"  (177.8 cm) Weight: 197 lb (89.359 kg) IBW/kg (Calculated) : 73 Adjusted Body Weight: 79.6 kg  Vital Signs: Temp: 95.7 F (35.4 C) (08/01 0759) Temp Source: Oral (08/01 0759) BP: 154/82 mmHg (08/01 0759) Pulse Rate: 107 (08/01 1128) Intake/Output from previous day: 07/31 0701 - 08/01 0700 In: 730 [P.O.:480; IV Piggyback:250] Out: 2000 [Urine:2000] Intake/Output from this shift: Total I/O In: -  Out: 900 [Urine:900]  Labs:  Recent Labs  04/18/15 0333  WBC 12.4*  HGB 14.0  PLT 167  CREATININE 1.03   Estimated Creatinine Clearance: 84.8 mL/min (by C-G formula based on Cr of 1.03). No results for input(s): VANCOTROUGH, VANCOPEAK, VANCORANDOM, GENTTROUGH, GENTPEAK, GENTRANDOM, TOBRATROUGH, TOBRAPEAK, TOBRARND, AMIKACINPEAK, AMIKACINTROU, AMIKACIN in the last 72 hours.   Microbiology: Recent Results (from the past 720 hour(s))  MRSA PCR Screening     Status: None   Collection Time: 04/01/15  1:20 PM  Result Value Ref Range Status   MRSA by PCR NEGATIVE NEGATIVE Final    Comment:        The GeneXpert MRSA Assay (FDA approved for NASAL specimens only), is one component of a comprehensive MRSA colonization surveillance program. It is not intended to diagnose MRSA infection nor to guide or monitor treatment for MRSA infections.   Culture, expectorated sputum-assessment     Status: None   Collection Time: 04/01/15  2:45 PM  Result Value Ref Range Status   Specimen Description SPUTUM  Final   Special Requests Normal  Final   Sputum evaluation THIS SPECIMEN IS ACCEPTABLE  FOR SPUTUM CULTURE  Final   Report Status 04/01/2015 FINAL  Final  Culture, respiratory (NON-Expectorated)     Status: None   Collection Time: 04/01/15  2:45 PM  Result Value Ref Range Status   Specimen Description SPUTUM  Final   Special Requests Normal Reflexed from [id number]  Final   Gram Stain   Final    MANY WBC SEEN RARE GRAM NEGATIVE RODS RARE GRAM POSITIVE COCCI    Culture   Final    RARE GROWTH METHICILLIN RESISTANT STAPHYLOCOCCUS AUREUS MODERATE GROWTH HAEMOPHILUS INFLUENZAE CRITICAL RESULT CALLED TO, READ BACK BY AND VERIFIED WITH: DR. Cherlynn Kaiser AT 0454 04/04/15 CTJ    Report Status 04/05/2015 FINAL  Final   Organism ID, Bacteria METHICILLIN RESISTANT STAPHYLOCOCCUS AUREUS  Final   Organism ID, Bacteria HAEMOPHILUS INFLUENZAE  Final      Susceptibility   Methicillin resistant staphylococcus aureus - MIC*    CIPROFLOXACIN >=8 RESISTANT Resistant     GENTAMICIN <=0.5 SENSITIVE Sensitive     OXACILLIN Value in next row Resistant      >=4 MODERATELY RESISTANT    VANCOMYCIN <=0.5 SENSITIVE Sensitive     TRIMETH/SULFA <=10 SENSITIVE Sensitive     CEFOXITIN SCREEN Value in next row Resistant      POSITIVECEFOXITIN SCREEN - This test may be used to predict mecA-mediated oxacillin resistance, and it is based on the cefoxitin disk screen test.  The cefoxitin screen and oxacillin work in combination to determine the final interpretation reported for oxacillin.     Inducible Clindamycin  Value in next row Sensitive      POSITIVECEFOXITIN SCREEN - This test may be used to predict mecA-mediated oxacillin resistance, and it is based on the cefoxitin disk screen test.  The cefoxitin screen and oxacillin work in combination to determine the final interpretation reported for oxacillin.     ERYTHROMYCIN Value in next row Resistant      RESISTANT>=8    TETRACYCLINE Value in next row Sensitive      SENSITIVE<=1    CLINDAMYCIN Value in next row Resistant      RESISTANT>=8    LINEZOLID Value in  next row Sensitive      SENSITIVE2    LEVOFLOXACIN Value in next row Resistant      RESISTANT>=8    * RARE GROWTH METHICILLIN RESISTANT STAPHYLOCOCCUS AUREUS  Culture, expectorated sputum-assessment     Status: None   Collection Time: 04/14/15  2:00 PM  Result Value Ref Range Status   Specimen Description SPUTUM  Final   Special Requests NONE  Final   Sputum evaluation THIS SPECIMEN IS ACCEPTABLE FOR SPUTUM CULTURE  Final   Report Status 04/14/2015 FINAL  Final  Culture, respiratory (NON-Expectorated)     Status: None   Collection Time: 04/14/15  2:00 PM  Result Value Ref Range Status   Specimen Description SPUTUM  Final   Special Requests NONE Reflexed from [id number]  Final   Gram Stain   Final    FEW WBC SEEN FEW YEAST FEW GRAM POSITIVE COCCI IN CHAINS FAIR SPECIMEN - 70-80% WBCS    Culture   Final    LIGHT GROWTH RAHNELLA AQUATILIS RAHNELLA IS RARELY PATHOGENIC TO HUMANS. CALL MICROBIOLOGY LAB IF SENSITIVITIES ARE REQUIRED.    Report Status 04/19/2015 FINAL  Final    Medical History: Past Medical History  Diagnosis Date  . Asthma   . COPD (chronic obstructive pulmonary disease)   . Allergy   . Anxiety   . Chronic diastolic CHF (congestive heart failure)     a. echo 07/2013: EF 60-65%, DD, biatrial dilatation, Ao sclerosis, dilated RV, moderate pulmonary HTN, elevated CV and RA pressures; b. patient reported echo at Dr. Milta Deiters office 02/2015 - his office does not have record of him being a pt there   . Depression   . Emphysema of lung   . GERD (gastroesophageal reflux disease)   . Tobacco abuse   . Depression   . Chronic respiratory failure     a. on 2L via nasal cannula; b. secondary to COPD    Medications:  Prescriptions prior to admission  Medication Sig Dispense Refill Last Dose  . Albuterol Sulfate (PROAIR RESPICLICK) 108 (90 BASE) MCG/ACT AEPB Inhale 2 puffs into the lungs every 4 (four) hours as needed (for shortness of breath).   04/13/2015 at Unknown time  .  ALPRAZolam (XANAX) 1 MG tablet Take 0.5-1 mg by mouth 3 (three) times daily as needed for anxiety.   04/13/2015 at Unknown time  . diltiazem (CARDIZEM) 120 MG tablet Take 120 mg by mouth daily.   04/13/2015 at Unknown time  . doxazosin (CARDURA) 4 MG tablet Take 4 mg by mouth daily.   04/13/2015 at Unknown time  . escitalopram (LEXAPRO) 20 MG tablet Take 20 mg by mouth daily.   04/13/2015 at Unknown time  . gabapentin (NEURONTIN) 100 MG capsule Take 200 mg by mouth 3 (three) times daily.   04/13/2015 at Unknown time  . ipratropium-albuterol (DUONEB) 0.5-2.5 (3) MG/3ML SOLN Take 3 mLs by nebulization 4 (four)  times daily as needed (for shortness of breath).   04/13/2015 at Unknown time  . levofloxacin (LEVAQUIN) 500 MG tablet Take 1 tablet (500 mg total) by mouth daily. 10 tablet 0 04/13/2015 at Unknown time  . mometasone-formoterol (DULERA) 100-5 MCG/ACT AERO Inhale 2 puffs into the lungs 2 (two) times daily. 1 Inhaler 2 04/13/2015 at Unknown time  . predniSONE (DELTASONE) 10 MG tablet Label  & dispense according to the schedule below. 5 Pills PO for 2 days then, 4 Pills PO for 2 days, 3 Pills PO for 2 days, 2 Pills PO for 2 days, 1 Pill PO for 2 days then STOP. 30 tablet 0 04/13/2015 at Unknown time  . sulfamethoxazole-trimethoprim (BACTRIM DS,SEPTRA DS) 800-160 MG per tablet Take 1 tablet by mouth 2 (two) times daily. 20 tablet 0 04/13/2015 at Unknown time  . theophylline (THEO-24) 400 MG 24 hr capsule Take 400 mg by mouth daily.   04/13/2015 at Unknown time   Assessment: Pharmacy consulted to dose vancomycin for AECOPD in this 58 year old male. Patient with recent sputum culture (7/14) with MRSA and h. Flu, treated with bactrim & levaquin.   Azithromycin added 7/27  Pk parameters (calculated): CrCl = 71.6 ml/min Ke = 0.06 hr-1 T1/2 = 11.5 hrs Vd = 62.6 L  Goal of Therapy:  Vancomycin trough 15-11mcg/ml  Plan:  Current orders for vancomycin 1250 IV Q12H. Renal function remains stable, will recheck  trough this afternoon.   Garlon Hatchet, PharmD Clinical Pharmacist  04/19/2015 3:04 PM

## 2015-04-19 NOTE — Clinical Social Work Placement (Signed)
   CLINICAL SOCIAL WORK PLACEMENT  NOTE  Date:  04/19/2015  Patient Details  Name: Dustin [name] MRN: [id number] Date of Birth: 02-20-1956  Clinical Social Work is seeking post-discharge placement for this patient at the Skilled  Nursing Facility level of care (*CSW will initial, date and re-position this form in  chart as items are completed):  Yes   Patient/family provided with [facility] Social Work Department's list of facilities offering this level of care within the geographic area requested by the patient (or if unable, by the patient's family).  Yes   Patient/family informed of their freedom to choose among providers that offer the needed level of care, that participate in Medicare, Medicaid or managed care program needed by the patient, have an available bed and are willing to accept the patient.  Yes   Patient/family informed of 's ownership interest in [facility] and [facility], as well as of the fact that they are under no obligation to receive care at these facilities.  PASRR submitted to EDS on       PASRR number received on       Existing PASRR number confirmed on 04/19/15     FL2 transmitted to all facilities in geographic area requested by pt/family on 04/18/15     FL2 transmitted to all facilities within larger geographic area on       Patient informed that his/her managed care company has contracts with or will negotiate with certain facilities, including the following:        Yes   Patient/family informed of bed offers received.  Patient chooses bed at  [facility] )     Physician recommends and patient chooses bed at      Patient to be transferred to   on  .  Patient to be transferred to facility by       Patient family notified on   of transfer.  Name of family member notified:        PHYSICIAN Please sign FL2     Additional Comment:    _______________________________________________ Haig Prophet,  [name] 04/19/2015, 11:32 AM

## 2015-04-20 LAB — CREATININE, SERUM
Creatinine, Ser: 0.94 mg/dL (ref 0.61–1.24)
GFR calc non Af Amer: >60 mL/min (ref >60)

## 2015-04-20 MED ORDER — GABAPENTIN 300 MG PO CAPS
600.0000 mg | ORAL_CAPSULE | Freq: Three times a day (TID) | ORAL | Status: DC
  Administered 2015-04-20 – 2015-04-21 (×3): 600 mg via ORAL
  Filled 2015-04-20 (×3): qty 2

## 2015-04-20 MED ORDER — MAGNESIUM HYDROXIDE 400 MG/5ML PO SUSP: 30.0000 mL | Freq: Every day | ORAL | Status: DC | PRN

## 2015-04-20 NOTE — Progress Notes (Signed)
Patient is not medically stable for D/C today per RN Case Manager. Per RN patient is down to 4 Liters of Oxygen. Clinical Child psychotherapist (CSW) made [employer] [facility] [employer] aware of above. Plan is for patient to D/C to [facility] when medically stable. CSW will continue to follow and assist as needed.   Jetta Lout, LCSWA [phone]

## 2015-04-20 NOTE — Progress Notes (Signed)
[facility] - [place] at [facility]   PATIENT NAME: Dustin [name]    MR#:  [id number]  DATE OF BIRTH:  [birth year]  SUBJECTIVE:  CHIEF COMPLAINT:   Chief Complaint  Patient presents with  . Shortness of Breath  still cough, SOB and wheezing,  SAT 88-90% on 4 liters O2  REVIEW OF SYSTEMS:   Review of Systems  Constitutional: Negative for fever and chills.  HENT: Negative for congestion and tinnitus.   Eyes: Negative for blurred vision and double vision.  Respiratory: Positive for cough, sputum production, shortness of breath and wheezing.   Cardiovascular: Negative for chest pain, orthopnea and PND.  Gastrointestinal: Negative for nausea, vomiting, abdominal pain and diarrhea.  Genitourinary: Negative for dysuria and hematuria.  Neurological: Negative for dizziness, sensory change and focal weakness.  All other systems reviewed and are negative.  Nutrition: Heart Healthy Tolerating Diet: Yes DRUG ALLERGIES:   Allergies  Allergen Reactions  . Asa [Aspirin] Other (See Comments)    Reaction: swelling of the right side.  . Codeine Other (See Comments)    Reaction: Difficulty breathing  . Ibuprofen Other (See Comments)    Reaction: Swelling of the right side.   VITALS:  Blood pressure 138/66, pulse 101, temperature 97.9 F (36.6 C), temperature source Oral, resp. rate 18, height 5\' 10"  (1.778 m), weight 89.359 kg (197 lb), SpO2 91 %. PHYSICAL EXAMINATION:  Physical Exam  GENERAL:  59 y.o.-year-old patient lying in the bed in NAD.  EYES: Pupils equal, round, reactive to light and accommodation. No scleral icterus. Extraocular muscles intact.  HEENT: Head atraumatic, normocephalic. Oropharynx and nasopharynx clear.  NECK:  Supple, no jugular venous distention. No thyroid enlargement, no tenderness.  LUNGS: Very diminished lung sounds and mild expiratory wheezing. No use of accessory muscles of respiration. No dullness to percussion.  CARDIOVASCULAR:  S1, S2 normal. No murmurs, rubs, or gallops.  ABDOMEN: Soft, nontender, nondistended. Bowel sounds present. No organomegaly or mass.  EXTREMITIES: No cyanosis, clubbing or edema b/l.    NEUROLOGIC: Cranial nerves II through XII are intact. No focal Motor or sensory deficits b/l.   PSYCHIATRIC: The patient is alert and oriented x 3. SKIN: No obvious rash, lesion, or ulcer.  LABORATORY PANEL:   CBC  Recent Labs Lab 04/18/15 0333  WBC 12.4*  HGB 14.0  HCT 42.9  PLT 167   ------------------------------------------------------------------------------------------------------------------  Chemistries   Recent Labs Lab 04/18/15 0333 04/20/15 0414  NA 138  --   K 4.1  --   CL 97*  --   CO2 33*  --   GLUCOSE 205*  --   BUN 33*  --   CREATININE 1.03 0.94  CALCIUM 8.7*  --   MG 2.2  --    ASSESSMENT AND PLAN:   59 year old male with past medical history of COPD, anxiety, CHF, depression, GERD who presented to the hospital due to shortness of breath and noted to be in acute on chronic respiratory failure due to COPD exacerbation.  #1 Acute on chronic respiratory failure due to COPD exacerbation-taper IV solumedrol, Duonebs around-the-clock,Spiriva, Pulmocort nebs, theophylline.  -Patient is on oxygen at home at 2 L. Still on O2 [place] 4L, try to wean  on Vanco for MRSA in the sputum and zithromax for atypical coverage Request pulmonary consult from Dr. Welton Flakes again.  #2 COPD exacerbation- try to wean Solumedrol, continue duonebs, Theophylline, Spiriva.  - continue Vanco, Zithromax, follows w/ Dr. Freda Munro.  #3 Pulmonary nodule- recommended CT scan  of the chest in 6 months to ensure stability. Likely in Dec'16  #4 Acute on chronic diastolic CHF- continue Lasix 40 mg BID. Most recent echo from July 03/2015 with normal LV function and elevated right sided pressures  #5 Hypertension essential - continue Cardizem  #6 Depression/Anxiety-continue low-dose Lexapro, also on  alprazolam  #7 Generalized Weakness-per physical therapy evaluation, need skilled nursing facility placement.    #8 Nicotine addiction. nicotine patch    #9 Neuropathy: increase to 600 mg po TID of gabapentin.   All the records are reviewed and case discussed with Care Management/Social Worker, respiratory tech. Management plans discussed with the patient, and he is in agreement.  CODE STATUS: Full  DVT Prophylaxis: Heparin subcutaneous  TOTAL TIME TAKING CARE OF THIS PATIENT: 38 minutes.   More than 50% of the time was spent in counseling/coordination of care: YES  POSSIBLE D/C in 2-3 days., DEPENDING ON CLINICAL CONDITION.    Shaune Pollack M.D on 04/20/2015 at 3:00 PM  Between 7am to 6pm - Pager - [phone]  After 6pm go to [email or web address] - password [email or web address] [facility]  [place] [facility]  Office  [phone]  CC: Primary care physician; Lyndon Code, MD

## 2015-04-20 NOTE — Progress Notes (Signed)
Patient cooperative with care.  Continued contact precautions for MRSA in sputum and droplet due to active coughing.  Patient on 4 liters oxygen stable @ 88-89%.  Gabapentin dosage increased due to numbness and constriction in hands.  Pulmonary consultation added, sputum cultures reported.

## 2015-04-20 NOTE — Progress Notes (Signed)
Physical Therapy Treatment Patient Details Name: Dustin [name] MRN: [id number] DOB: [birth year] Today's Date: 04/20/2015    History of Present Illness Enrigue Hashimi is a 59 y.o. male with a known history of COPD, chronic respiratory failure 2 L has Baseline presenting with shortness of breath. 4-5 day duration of worsening shortness of breath mainly described as dyspnea on exertion with associated cough, productive greenish sputum denies any fevers or chills or chest pain. currently on 5L O2 maintaining 90% O2 Sat.     PT Comments    Patient continues to have modest O2 saturation levels, which limits his mobility tolerance at this time. He continues to provide good effort with PT, and improved independence with transfers and OOB mobility. Patient on 6L of O2 during ambulation secondary to poor ventilation, though he had no dips below 87% today. He continues to have a higher HR with ambulation, though this is also a result of increased HR at rest (107 bpm rest to 125 bpm during ambulation). Patient will continue to benefit from PT services to address his mobility tolerance deficits.   Follow Up Recommendations  SNF     Equipment Recommendations  Rolling walker with 5" wheels    Recommendations for Dustin Services       Precautions / Restrictions Precautions Precautions: Fall Precaution Comments: Droplet precuations.  Restrictions Weight Bearing Restrictions: No Dustin Position/Activity Restrictions: Iso    Mobility  Bed Mobility Overal bed mobility: Modified Independent             General bed mobility comments: Patient has head of bed elevated and uses hand rails to complete supine to sit transfer.   Transfers Overall transfer level: Modified independent Equipment used: Rolling walker (2 wheeled) Transfers: Sit to/from Stand Sit to Stand: Modified independent (Device/Increase time)         General transfer comment: Patient is able to perform sit to stand with RW and  no loss of balance from both the chair and bed with no physical support.   Ambulation/Gait Ambulation/Gait assistance: Modified independent (Device/Increase time) Ambulation Distance (Feet): 60 Feet (2 bouts of 30') Assistive device: Rolling walker (2 wheeled) Gait Pattern/deviations: WFL(Within Functional Limits)   Gait velocity interpretation: Below normal speed for age/gender General Gait Details: Patient takes reciprocal step pattern on 6L of O2 (tanks do not have option for 5L) Maintained reasonable O2 levels (consistently above 87%). He does require rest breaks throughout ambulation secondary to feeling short of breath.    Stairs            Wheelchair Mobility    Modified Rankin (Stroke Patients Only)       Balance Overall balance assessment: Modified Independent                                  Cognition Arousal/Alertness: Awake/alert Behavior During Therapy: WFL for tasks assessed/performed Overall Cognitive Status: Within Functional Limits for tasks assessed                      Exercises General Exercises - Lower Extremity Long Arc Quad: AROM;Both;10 reps Straight Leg Raises: AROM;Both;10 reps Hip Flexion/Marching: AROM;Both;10 reps Dustin Exercises Dustin Exercises: Sit to stand x 5 repetitions  Dustin Exercises: Standing hip marches x 10 repetitions    General Comments        Pertinent Vitals/Pain Pain Assessment: No/denies pain    Home Living  Prior Function            PT Goals (current goals can now be found in the care plan section) Acute Rehab PT Goals Patient Stated Goal: regain strength and endurance  PT Goal Formulation: With patient Time For Goal Achievement: 05/01/15 Potential to Achieve Goals: Good Progress towards PT goals: Progressing toward goals    Frequency  Min 2X/week    PT Plan Current plan remains appropriate    Co-evaluation             End of Session  Equipment Utilized During Treatment: Gait belt;Oxygen Activity Tolerance: Patient limited by fatigue;Patient tolerated treatment well Patient left: in chair;with chair alarm set;with call bell/phone within reach     Time: 9147-8295 PT Time Calculation (min) (ACUTE ONLY): 28 min  Charges:  $Gait Training: 8-22 mins $Therapeutic Exercise: 8-22 mins                    G Codes:      Kerin Ransom, PT, DPT    04/20/2015, 4:49 PM

## 2015-04-21 DIAGNOSIS — F419 Anxiety disorder, unspecified: Secondary | ICD-10-CM | POA: Diagnosis not present

## 2015-04-21 DIAGNOSIS — J439 Emphysema, unspecified: Secondary | ICD-10-CM | POA: Diagnosis not present

## 2015-04-21 DIAGNOSIS — F17200 Nicotine dependence, unspecified, uncomplicated: Secondary | ICD-10-CM | POA: Diagnosis not present

## 2015-04-21 DIAGNOSIS — I509 Heart failure, unspecified: Secondary | ICD-10-CM | POA: Diagnosis not present

## 2015-04-21 DIAGNOSIS — Z9989 Dependence on other enabling machines and devices: Secondary | ICD-10-CM | POA: Diagnosis not present

## 2015-04-21 DIAGNOSIS — J440 Chronic obstructive pulmonary disease with acute lower respiratory infection: Secondary | ICD-10-CM | POA: Diagnosis not present

## 2015-04-21 DIAGNOSIS — F411 Generalized anxiety disorder: Secondary | ICD-10-CM | POA: Diagnosis not present

## 2015-04-21 DIAGNOSIS — J3089 Other allergic rhinitis: Secondary | ICD-10-CM | POA: Diagnosis not present

## 2015-04-21 DIAGNOSIS — I5032 Chronic diastolic (congestive) heart failure: Secondary | ICD-10-CM | POA: Diagnosis not present

## 2015-04-21 DIAGNOSIS — J471 Bronchiectasis with (acute) exacerbation: Secondary | ICD-10-CM | POA: Diagnosis not present

## 2015-04-21 DIAGNOSIS — F329 Major depressive disorder, single episode, unspecified: Secondary | ICD-10-CM | POA: Diagnosis not present

## 2015-04-21 DIAGNOSIS — J441 Chronic obstructive pulmonary disease with (acute) exacerbation: Secondary | ICD-10-CM | POA: Diagnosis not present

## 2015-04-21 DIAGNOSIS — G629 Polyneuropathy, unspecified: Secondary | ICD-10-CM | POA: Diagnosis not present

## 2015-04-21 DIAGNOSIS — K219 Gastro-esophageal reflux disease without esophagitis: Secondary | ICD-10-CM | POA: Diagnosis not present

## 2015-04-21 DIAGNOSIS — J9621 Acute and chronic respiratory failure with hypoxia: Secondary | ICD-10-CM | POA: Diagnosis not present

## 2015-04-21 DIAGNOSIS — I10 Essential (primary) hypertension: Secondary | ICD-10-CM | POA: Diagnosis not present

## 2015-04-21 DIAGNOSIS — Z9981 Dependence on supplemental oxygen: Secondary | ICD-10-CM | POA: Diagnosis not present

## 2015-04-21 DIAGNOSIS — A4902 Methicillin resistant Staphylococcus aureus infection, unspecified site: Secondary | ICD-10-CM | POA: Diagnosis not present

## 2015-04-21 LAB — BASIC METABOLIC PANEL
Anion gap: 8 (ref 5–15)
BUN: 32 mg/dL — ABNORMAL HIGH (ref 6–20)
CO2: 32 mmol/L (ref 22–32)
CREATININE: 0.91 mg/dL (ref 0.61–1.24)
Calcium: 8.2 mg/dL — ABNORMAL LOW (ref 8.9–10.3)
Chloride: 100 mmol/L — ABNORMAL LOW (ref 101–111)
GFR calc Af Amer: >60 mL/min (ref >60)
GLUCOSE: 157 mg/dL — AB (ref 65–99)
POTASSIUM: 4.4 mmol/L (ref 3.5–5.1)
Sodium: 140 mmol/L (ref 135–145)

## 2015-04-21 LAB — CBC
HEMATOCRIT: 40.4 % (ref 40.0–52.0)
Hemoglobin: 13.3 g/dL (ref 13.0–18.0)
MCH: 31.5 pg (ref 26.0–34.0)
MCHC: 33 g/dL (ref 32.0–36.0)
MCV: 95.4 fL (ref 80.0–100.0)
Platelets: 129 10*3/uL — ABNORMAL LOW (ref 150–440)
RBC: 4.24 MIL/uL — AB (ref 4.40–5.90)
RDW: 14.5 % (ref 11.5–14.5)
WBC: 11.7 10*3/uL — AB (ref 3.8–10.6)

## 2015-04-21 LAB — MAGNESIUM: Magnesium: 2.3 mg/dL (ref 1.7–2.4)

## 2015-04-21 MED ORDER — FLUTICASONE-SALMETEROL 250-50 MCG/DOSE IN AEPB: 1.0000 | INHALATION_SPRAY | Freq: Two times a day (BID) | RESPIRATORY_TRACT | Status: DC

## 2015-04-21 MED ORDER — AZITHROMYCIN 250 MG PO TABS: 250.0000 mg | ORAL_TABLET | Freq: Every day | ORAL | Status: DC

## 2015-04-21 MED ORDER — NICOTINE 21 MG/24HR TD PT24: 21.0000 mg | MEDICATED_PATCH | Freq: Every day | TRANSDERMAL | Status: DC

## 2015-04-21 MED ORDER — POTASSIUM CHLORIDE CRYS ER 10 MEQ PO TBCR: 10.0000 meq | EXTENDED_RELEASE_TABLET | Freq: Two times a day (BID) | ORAL | Status: DC

## 2015-04-21 MED ORDER — FUROSEMIDE 40 MG PO TABS: 40.0000 mg | ORAL_TABLET | Freq: Two times a day (BID) | ORAL | Status: DC

## 2015-04-21 MED ORDER — TIOTROPIUM BROMIDE MONOHYDRATE 18 MCG IN CAPS
18.0000 ug | ORAL_CAPSULE | Freq: Every day | RESPIRATORY_TRACT | Status: DC
Start: 2015-04-21 — End: 2015-11-30

## 2015-04-21 MED ORDER — GUAIFENESIN ER 600 MG PO TB12: 600.0000 mg | ORAL_TABLET | Freq: Two times a day (BID) | ORAL | Status: DC

## 2015-04-21 MED ORDER — RISAQUAD PO CAPS: 1.0000 | ORAL_CAPSULE | Freq: Every day | ORAL | Status: DC

## 2015-04-21 MED ORDER — ALPRAZOLAM 1 MG PO TABS: 0.5000 mg | ORAL_TABLET | Freq: Three times a day (TID) | ORAL | Status: DC | PRN

## 2015-04-21 MED ORDER — GABAPENTIN 300 MG PO CAPS: 600.0000 mg | ORAL_CAPSULE | Freq: Three times a day (TID) | ORAL | Status: DC

## 2015-04-21 NOTE — Clinical Social Work Placement (Signed)
   CLINICAL SOCIAL WORK PLACEMENT  NOTE  Date:  04/21/2015  Patient Details  Name: Dustin [name] MRN: [id number] Date of Birth: Nov 03, 1955  Clinical Social Work is seeking post-discharge placement for this patient at the Skilled  Nursing Facility level of care (*CSW will initial, date and re-position this form in  chart as items are completed):  Yes   Patient/family provided with [facility] Clinical Social Work Department's list of facilities offering this level of care within the geographic area requested by the patient (or if unable, by the patient's family).  Yes   Patient/family informed of their freedom to choose among providers that offer the needed level of care, that participate in Medicare, Medicaid or managed care program needed by the patient, have an available bed and are willing to accept the patient.  Yes   Patient/family informed of [facility] [facility]'s ownership interest in [facility], as well as of the fact that they are under no obligation to receive care at these facilities.  PASRR submitted to EDS on       PASRR number received on       Existing PASRR number confirmed on 04/19/15     FL2 transmitted to all facilities in geographic area requested by pt/family on 04/18/15     FL2 transmitted to all facilities within larger geographic area on       Patient informed that his/her managed care company has contracts with or will negotiate with certain facilities, including the following:        Yes   Patient/family informed of bed offers received.  Patient chooses bed at  [facility] )     Physician recommends and patient chooses bed at      Patient to be transferred to  [facility] ) on 04/21/15.  Patient to be transferred to facility by  [facility] )     Patient family notified on 04/21/15 of transfer.  Name of family member notified:   (Attempted to contact patient's wife. )     PHYSICIAN       Additional Comment:     _______________________________________________ Haig Prophet, [name] 04/21/2015, 2:24 PM

## 2015-04-21 NOTE — Plan of Care (Signed)
Problem: Discharge Progression Outcomes Goal: Other Discharge Outcomes/Goals Outcome: Progressing Pt is stable to tsfer to [facility] today.  Pt will leave on 3L and will continue to be tx for MRSA in sputum w/PO abx.  Has been getting IV solumedrol, neb tx and IV abx.  Got up w/1 asst to [facility] but gets very dyspneic. Had large BM today.  Still has BLE edema +1.  Has PU on ears from IV tubing. Did experience some anxiety at beginning of shift, but relieved w/xanax.  IVs removed.  Report called.  Pt leaving via EMS.

## 2015-04-21 NOTE — Plan of Care (Signed)
Called report to Era Pollie Friar at [facility].

## 2015-04-21 NOTE — Discharge Summary (Signed)
[facility] Phys[facility] - [place] at [facility]   PATIENT NAME: Dustin [name]    MR#:  [id number]  DATE OF BIRTH:  [birth year]  DATE OF ADMISSION:  04/13/2015 ADMITTING PHYSICIAN: Auburn Bilberry, MD  DATE OF DISCHARGE: 04/21/2015 PRIMARY CARE PHYSICIAN: Lyndon Code, MD    ADMISSION DIAGNOSIS:  COPD with acute exacerbation [J44.1]   DISCHARGE DIAGNOSIS:  Acute on chronic respiratory failure due to COPD exacerbation Acute on chronic diastolic CHF Acute on chronic diastolic CHF SECONDARY DIAGNOSIS:   Past Medical History  Diagnosis Date  . Asthma   . COPD (chronic obstructive pulmonary disease)   . Allergy   . Anxiety   . Chronic diastolic CHF (congestive heart failure)     a. echo 07/2013: EF 60-65%, DD, biatrial dilatation, Ao sclerosis, dilated RV, moderate pulmonary HTN, elevated CV and RA pressures; b. patient reported echo at Dr. Milta Deiters office 02/2015 - his office does not have record of him being a pt there   . Depression   . Emphysema of lung   . GERD (gastroesophageal reflux disease)   . Tobacco abuse   . Depression   . Chronic respiratory failure     a. on 2L via nasal cannula; b. secondary to COPD    HOSPITAL COURSE:   #1 Acute on chronic respiratory failure due to COPD exacerbation. The patient has been treated with IV solumedrol, Duonebs around-the-clock,Spiriva, Pulmocort nebs, theophylline. He is on oxygen at home at 2 L. but needed O2 [place] 4L now. It is hard to wean down oxygen.He has been treated with Vanco for MRSA in the sputum and zithromax for atypical coverage.  #2 COPD exacerbation. As above. #3 Pulmonary nodule- recommended CT scan of the chest in 6 months to ensure stability. Likely in Dec'16. Follow-up with Dr. Welton Flakes as outpatient.  #4 Acute on chronic diastolic CHF. He has been treated with Lasix 40 mg BID. Most recent echo from July 03/2015 with normal LV function and elevated right sided pressures  #5 Hypertension essential -  continue Cardizem  #6 Depression/Anxiety-continue low-dose Lexapro, also on alprazolam  #7 Generalized Weakness-per physical therapy evaluation, need skilled nursing facility placement.   #8 Nicotine addiction. nicotine patch   #9 Neuropathy: increase to 600 mg po TID of gabapentin.  Generally, the patient is symptoms has been slowly and gradually improving, but still has cough, shortness breath and a mild wheezing, need oxygen 3-4 L by nasal cannula. He needed to continue oxygen and nebulizer treatment as outpatient. Follow-up with Dr. Welton Flakes, pulmonary physician.  DISCHARGE CONDITIONS:   Guarded. The patient will be discharged to skilled nursing facility today.  CONSULTS OBTAINED:  Treatment Team:  Auburn Bilberry, MD Yevonne Pax, MD Iran Ouch, MD Mertie Moores, MD  DRUG ALLERGIES:   Allergies  Allergen Reactions  . Asa [Aspirin] Other (See Comments)    Reaction: swelling of the right side.  . Codeine Other (See Comments)    Reaction: Difficulty breathing  . Ibuprofen Other (See Comments)    Reaction: Swelling of the right side.    DISCHARGE MEDICATIONS:   Current Discharge Medication List    START taking these medications   Details  acidophilus ([name]) CAPS capsule Take 1 capsule by mouth daily. Qty: 30 capsule, Refills: 0    azithromycin (ZITHROMAX) 250 MG tablet Take 1 tablet (250 mg total) by mouth daily. Qty: 6 tablet, Refills: 0    Fluticasone-Salmeterol (ADVAIR DISKUS) 250-50 MCG/DOSE AEPB Inhale 1 puff into the lungs 2 (  two) times daily. Qty: 60 each, Refills: 0    furosemide (LASIX) 40 MG tablet Take 1 tablet (40 mg total) by mouth 2 (two) times daily. Qty: 30 tablet, Refills: 0    guaiFENesin (MUCINEX) 600 MG 12 hr tablet Take 1 tablet (600 mg total) by mouth 2 (two) times daily. Qty: 30 tablet, Refills: 0    nicotine (NICODERM CQ - DOSED IN MG/24 HOURS) 21 mg/24hr patch Place 1 patch (21 mg total) onto the skin daily. Qty: 28 patch,  Refills: 0    potassium chloride (K-DUR,KLOR-CON) 10 MEQ tablet Take 1 tablet (10 mEq total) by mouth 2 (two) times daily. Qty: 30 tablet, Refills: 0    tiotropium (SPIRIVA) 18 MCG inhalation capsule Place 1 capsule (18 mcg total) into inhaler and inhale daily. Qty: 30 capsule, Refills: 0      CONTINUE these medications which have CHANGED   Details  ALPRAZolam (XANAX) 1 MG tablet Take 0.5-1 tablets (0.5-1 mg total) by mouth 3 (three) times daily as needed for anxiety. Qty: 30 tablet, Refills: 0    gabapentin (NEURONTIN) 300 MG capsule Take 2 capsules (600 mg total) by mouth 3 (three) times daily. Qty: 30 capsule, Refills: 0      CONTINUE these medications which have NOT CHANGED   Details  Albuterol Sulfate (PROAIR RESPICLICK) 108 (90 BASE) MCG/ACT AEPB Inhale 2 puffs into the lungs every 4 (four) hours as needed (for shortness of breath).    diltiazem (CARDIZEM) 120 MG tablet Take 120 mg by mouth daily.    doxazosin (CARDURA) 4 MG tablet Take 4 mg by mouth daily.    escitalopram (LEXAPRO) 20 MG tablet Take 20 mg by mouth daily.    ipratropium-albuterol (DUONEB) 0.5-2.5 (3) MG/3ML SOLN Take 3 mLs by nebulization 4 (four) times daily as needed (for shortness of breath).    predniSONE (DELTASONE) 10 MG tablet Label  & dispense according to the schedule below. 5 Pills PO for 2 days then, 4 Pills PO for 2 days, 3 Pills PO for 2 days, 2 Pills PO for 2 days, 1 Pill PO for 2 days then STOP. Qty: 30 tablet, Refills: 0    theophylline (THEO-24) 400 MG 24 hr capsule Take 400 mg by mouth daily.      STOP taking these medications     levofloxacin (LEVAQUIN) 500 MG tablet      mometasone-formoterol (DULERA) 100-5 MCG/ACT AERO      sulfamethoxazole-trimethoprim (BACTRIM DS,SEPTRA DS) 800-160 MG per tablet          DISCHARGE INSTRUCTIONS:     If you experience worsening of your admission symptoms, develop shortness of breath, life threatening emergency, suicidal or homicidal  thoughts you must seek medical attention immediately by calling 911 or calling your MD immediately  if symptoms less severe.  You Must read complete instructions/literature along with all the possible adverse reactions/side effects for all the Medicines you take and that have been prescribed to you. Take any new Medicines after you have completely understood and accept all the possible adverse reactions/side effects.   Please note  You were cared for by a hospitalist during your hospital stay. If you have any questions about your discharge medications or the care you received while you were in the hospital after you are discharged, you can call the unit and asked to speak with the hospitalist on call if the hospitalist that took care of you is not available. Once you are discharged, your primary care physician will handle any  further medical issues. Please note that NO REFILLS for any discharge medications will be authorized once you are discharged, as it is imperative that you return to your primary care physician (or establish a relationship with a primary care physician if you do not have one) for your aftercare needs so that they can reassess your need for medications and monitor your lab values.    Today   SUBJECTIVE      VITAL SIGNS:  Blood pressure 104/60, pulse 101, temperature 96.9 F (36.1 C), temperature source Oral, resp. rate 22, height 5\' 10"  (1.778 m), weight 89.359 kg (197 lb), SpO2 92 %.  I/O:   Intake/Output Summary (Last 24 hours) at 04/21/15 1153 Last data filed at 04/21/15 1140  Gross per 24 hour  Intake    295 ml  Output   3001 ml  Net  -2706 ml    PHYSICAL EXAMINATION:  GENERAL:  59 y.o.-year-old patient lying in the bed with no acute distress.  EYES: Pupils equal, round, reactive to light and accommodation. No scleral icterus. Extraocular muscles intact.  HEENT: Head atraumatic, normocephalic. Oropharynx and nasopharynx clear.  NECK:  Supple, no jugular  venous distention. No thyroid enlargement, no tenderness.  LUNGS: Diminished breath sounds bilaterally, mild expiratory wheezing well coughing, no rales,rhonchi or crepitation. No use of accessory muscles of respiration.  CARDIOVASCULAR: S1, S2 normal. No murmurs, rubs, or gallops.  ABDOMEN: Soft, non-tender, non-distended. Bowel sounds present. No organomegaly or mass.  EXTREMITIES: No pedal edema, cyanosis, or clubbing.  NEUROLOGIC: Cranial nerves II through XII are intact. Muscle strength 5/5 in all extremities. Sensation intact. Gait not checked.  PSYCHIATRIC: The patient is alert and oriented x 3.  SKIN: No obvious rash, lesion, or ulcer.   DATA REVIEW:   CBC  Recent Labs Lab 04/21/15 0433  WBC 11.7*  HGB 13.3  HCT 40.4  PLT 129*    Chemistries   Recent Labs Lab 04/21/15 0433  NA 140  K 4.4  CL 100*  CO2 32  GLUCOSE 157*  BUN 32*  CREATININE 0.91  CALCIUM 8.2*  MG 2.3    Cardiac Enzymes No results for input(s): TROPONINI in the last 168 hours.  Microbiology Results  Results for orders placed or performed during the hospital encounter of 04/13/15  Culture, expectorated sputum-assessment     Status: None   Collection Time: 04/14/15  2:00 PM  Result Value Ref Range Status   Specimen Description SPUTUM  Final   Special Requests NONE  Final   Sputum evaluation THIS SPECIMEN IS ACCEPTABLE FOR SPUTUM CULTURE  Final   Report Status 04/14/2015 FINAL  Final  Culture, respiratory (NON-Expectorated)     Status: None   Collection Time: 04/14/15  2:00 PM  Result Value Ref Range Status   Specimen Description SPUTUM  Final   Special Requests NONE Reflexed from [id number]  Final   Gram Stain   Final    FEW WBC SEEN FEW YEAST FEW GRAM POSITIVE COCCI IN CHAINS FAIR SPECIMEN - 70-80% WBCS    Culture   Final    LIGHT GROWTH RAHNELLA AQUATILIS RAHNELLA IS RARELY PATHOGENIC TO HUMANS. CALL MICROBIOLOGY LAB IF SENSITIVITIES ARE REQUIRED.    Report Status 04/19/2015 FINAL   Final    RADIOLOGY:  No results found.      Management plans discussed with the patient, family and they are in agreement.  CODE STATUS:     Code Status Orders        Start  Ordered   04/13/15 1723  Full code   Continuous     04/13/15 1722      TOTAL TIME TAKING CARE OF THIS PATIENT: 46 minutes.    Shaune Pollack M.D on 04/21/2015 at 11:53 AM  Between 7am to 6pm - Pager - [phone]  After 6pm go to [email or web address] - password [email or web address] [facility]  [place] [facility]  Office  [phone]  CC: Primary care physician; Lyndon Code, MD

## 2015-04-21 NOTE — Progress Notes (Signed)
Patient is medically stable for D/C to [facility] today. Per [facility] admissions coordinator at [facility] patient is going to room 404. RN will call report to 400 hall RN and arrange EMS for transport. Clinical Social worker (CSW) prepared D/C packet and sent D/C Summary and follow up appointments to [facility] via [facility]. Patient is aware of above. Patient has several questions for MD regarding his MRSA infection. CSW made MD and RN aware of patient's questions. CSW attempted to call patient's wife however got a voicemail. Please reconsult if future social work needs arise. CSW signing off.   Jetta Lout, LCSWA [phone]

## 2015-04-21 NOTE — Progress Notes (Signed)
Spoke with [place], [employer] rep at [phone], to notify of non-emergent EMS transport.  Auth notification reference given as [id number].   Service date range good from 04/21/15 - 07/20/15.   Gap exception requested to determine if services can be considered at an in-network level.

## 2015-04-21 NOTE — Discharge Instructions (Signed)
Low sodium and low fat diet. Activity as tolerated. Continue O2 [place] 3-4 L.

## 2015-04-21 NOTE — Care Management Important Message (Signed)
Important Message  Patient Details  Name: PREM COYKENDALL MRN: [id number] Date of Birth: 09/23/1955   Medicare Important Message Given:  N/A - LOS <3 / Initial given by admissions    Collie Siad, RN 04/21/2015, 10:46 AM

## 2015-04-23 DIAGNOSIS — I10 Essential (primary) hypertension: Secondary | ICD-10-CM | POA: Diagnosis not present

## 2015-04-23 DIAGNOSIS — J441 Chronic obstructive pulmonary disease with (acute) exacerbation: Secondary | ICD-10-CM | POA: Diagnosis not present

## 2015-04-23 DIAGNOSIS — F411 Generalized anxiety disorder: Secondary | ICD-10-CM | POA: Diagnosis not present

## 2015-05-04 DIAGNOSIS — I10 Essential (primary) hypertension: Secondary | ICD-10-CM | POA: Diagnosis not present

## 2015-05-04 DIAGNOSIS — M6281 Muscle weakness (generalized): Secondary | ICD-10-CM | POA: Diagnosis not present

## 2015-05-04 DIAGNOSIS — J45909 Unspecified asthma, uncomplicated: Secondary | ICD-10-CM | POA: Diagnosis not present

## 2015-05-04 DIAGNOSIS — J15211 Pneumonia due to Methicillin susceptible Staphylococcus aureus: Secondary | ICD-10-CM | POA: Diagnosis not present

## 2015-05-04 DIAGNOSIS — I5030 Unspecified diastolic (congestive) heart failure: Secondary | ICD-10-CM | POA: Diagnosis not present

## 2015-05-04 DIAGNOSIS — F328 Other depressive episodes: Secondary | ICD-10-CM | POA: Diagnosis not present

## 2015-05-04 DIAGNOSIS — J441 Chronic obstructive pulmonary disease with (acute) exacerbation: Secondary | ICD-10-CM | POA: Diagnosis not present

## 2015-05-04 DIAGNOSIS — G9009 Other idiopathic peripheral autonomic neuropathy: Secondary | ICD-10-CM | POA: Diagnosis not present

## 2015-05-04 DIAGNOSIS — F419 Anxiety disorder, unspecified: Secondary | ICD-10-CM | POA: Diagnosis not present

## 2015-05-04 DIAGNOSIS — Z9981 Dependence on supplemental oxygen: Secondary | ICD-10-CM | POA: Diagnosis not present

## 2015-05-07 DIAGNOSIS — Z9981 Dependence on supplemental oxygen: Secondary | ICD-10-CM | POA: Diagnosis not present

## 2015-05-07 DIAGNOSIS — I10 Essential (primary) hypertension: Secondary | ICD-10-CM | POA: Diagnosis not present

## 2015-05-07 DIAGNOSIS — J15211 Pneumonia due to Methicillin susceptible Staphylococcus aureus: Secondary | ICD-10-CM | POA: Diagnosis not present

## 2015-05-07 DIAGNOSIS — F328 Other depressive episodes: Secondary | ICD-10-CM | POA: Diagnosis not present

## 2015-05-07 DIAGNOSIS — I5030 Unspecified diastolic (congestive) heart failure: Secondary | ICD-10-CM | POA: Diagnosis not present

## 2015-05-07 DIAGNOSIS — F419 Anxiety disorder, unspecified: Secondary | ICD-10-CM | POA: Diagnosis not present

## 2015-05-07 DIAGNOSIS — M6281 Muscle weakness (generalized): Secondary | ICD-10-CM | POA: Diagnosis not present

## 2015-05-07 DIAGNOSIS — J45909 Unspecified asthma, uncomplicated: Secondary | ICD-10-CM | POA: Diagnosis not present

## 2015-05-07 DIAGNOSIS — J441 Chronic obstructive pulmonary disease with (acute) exacerbation: Secondary | ICD-10-CM | POA: Diagnosis not present

## 2015-05-07 DIAGNOSIS — G9009 Other idiopathic peripheral autonomic neuropathy: Secondary | ICD-10-CM | POA: Diagnosis not present

## 2015-05-10 DIAGNOSIS — F419 Anxiety disorder, unspecified: Secondary | ICD-10-CM | POA: Diagnosis not present

## 2015-05-10 DIAGNOSIS — J441 Chronic obstructive pulmonary disease with (acute) exacerbation: Secondary | ICD-10-CM | POA: Diagnosis not present

## 2015-05-10 DIAGNOSIS — M6281 Muscle weakness (generalized): Secondary | ICD-10-CM | POA: Diagnosis not present

## 2015-05-10 DIAGNOSIS — Z9981 Dependence on supplemental oxygen: Secondary | ICD-10-CM | POA: Diagnosis not present

## 2015-05-10 DIAGNOSIS — J45909 Unspecified asthma, uncomplicated: Secondary | ICD-10-CM | POA: Diagnosis not present

## 2015-05-10 DIAGNOSIS — I10 Essential (primary) hypertension: Secondary | ICD-10-CM | POA: Diagnosis not present

## 2015-05-10 DIAGNOSIS — F328 Other depressive episodes: Secondary | ICD-10-CM | POA: Diagnosis not present

## 2015-05-10 DIAGNOSIS — G9009 Other idiopathic peripheral autonomic neuropathy: Secondary | ICD-10-CM | POA: Diagnosis not present

## 2015-05-10 DIAGNOSIS — I5030 Unspecified diastolic (congestive) heart failure: Secondary | ICD-10-CM | POA: Diagnosis not present

## 2015-05-10 DIAGNOSIS — J15211 Pneumonia due to Methicillin susceptible Staphylococcus aureus: Secondary | ICD-10-CM | POA: Diagnosis not present

## 2015-05-12 DIAGNOSIS — J15211 Pneumonia due to Methicillin susceptible Staphylococcus aureus: Secondary | ICD-10-CM | POA: Diagnosis not present

## 2015-05-12 DIAGNOSIS — F328 Other depressive episodes: Secondary | ICD-10-CM | POA: Diagnosis not present

## 2015-05-12 DIAGNOSIS — F419 Anxiety disorder, unspecified: Secondary | ICD-10-CM | POA: Diagnosis not present

## 2015-05-12 DIAGNOSIS — J45909 Unspecified asthma, uncomplicated: Secondary | ICD-10-CM | POA: Diagnosis not present

## 2015-05-12 DIAGNOSIS — I5030 Unspecified diastolic (congestive) heart failure: Secondary | ICD-10-CM | POA: Diagnosis not present

## 2015-05-12 DIAGNOSIS — Z9981 Dependence on supplemental oxygen: Secondary | ICD-10-CM | POA: Diagnosis not present

## 2015-05-12 DIAGNOSIS — M6281 Muscle weakness (generalized): Secondary | ICD-10-CM | POA: Diagnosis not present

## 2015-05-12 DIAGNOSIS — I10 Essential (primary) hypertension: Secondary | ICD-10-CM | POA: Diagnosis not present

## 2015-05-12 DIAGNOSIS — J441 Chronic obstructive pulmonary disease with (acute) exacerbation: Secondary | ICD-10-CM | POA: Diagnosis not present

## 2015-05-12 DIAGNOSIS — G9009 Other idiopathic peripheral autonomic neuropathy: Secondary | ICD-10-CM | POA: Diagnosis not present

## 2015-05-13 DIAGNOSIS — F328 Other depressive episodes: Secondary | ICD-10-CM | POA: Diagnosis not present

## 2015-05-13 DIAGNOSIS — J15211 Pneumonia due to Methicillin susceptible Staphylococcus aureus: Secondary | ICD-10-CM | POA: Diagnosis not present

## 2015-05-13 DIAGNOSIS — F419 Anxiety disorder, unspecified: Secondary | ICD-10-CM | POA: Diagnosis not present

## 2015-05-13 DIAGNOSIS — Z9981 Dependence on supplemental oxygen: Secondary | ICD-10-CM | POA: Diagnosis not present

## 2015-05-13 DIAGNOSIS — M6281 Muscle weakness (generalized): Secondary | ICD-10-CM | POA: Diagnosis not present

## 2015-05-13 DIAGNOSIS — I10 Essential (primary) hypertension: Secondary | ICD-10-CM | POA: Diagnosis not present

## 2015-05-13 DIAGNOSIS — J45909 Unspecified asthma, uncomplicated: Secondary | ICD-10-CM | POA: Diagnosis not present

## 2015-05-13 DIAGNOSIS — I5030 Unspecified diastolic (congestive) heart failure: Secondary | ICD-10-CM | POA: Diagnosis not present

## 2015-05-13 DIAGNOSIS — G9009 Other idiopathic peripheral autonomic neuropathy: Secondary | ICD-10-CM | POA: Diagnosis not present

## 2015-05-13 DIAGNOSIS — J441 Chronic obstructive pulmonary disease with (acute) exacerbation: Secondary | ICD-10-CM | POA: Diagnosis not present

## 2015-05-18 DIAGNOSIS — Z9981 Dependence on supplemental oxygen: Secondary | ICD-10-CM | POA: Diagnosis not present

## 2015-05-18 DIAGNOSIS — F419 Anxiety disorder, unspecified: Secondary | ICD-10-CM | POA: Diagnosis not present

## 2015-05-18 DIAGNOSIS — G9009 Other idiopathic peripheral autonomic neuropathy: Secondary | ICD-10-CM | POA: Diagnosis not present

## 2015-05-18 DIAGNOSIS — I5030 Unspecified diastolic (congestive) heart failure: Secondary | ICD-10-CM | POA: Diagnosis not present

## 2015-05-18 DIAGNOSIS — J45909 Unspecified asthma, uncomplicated: Secondary | ICD-10-CM | POA: Diagnosis not present

## 2015-05-18 DIAGNOSIS — J15211 Pneumonia due to Methicillin susceptible Staphylococcus aureus: Secondary | ICD-10-CM | POA: Diagnosis not present

## 2015-05-18 DIAGNOSIS — J441 Chronic obstructive pulmonary disease with (acute) exacerbation: Secondary | ICD-10-CM | POA: Diagnosis not present

## 2015-05-18 DIAGNOSIS — F328 Other depressive episodes: Secondary | ICD-10-CM | POA: Diagnosis not present

## 2015-05-18 DIAGNOSIS — I10 Essential (primary) hypertension: Secondary | ICD-10-CM | POA: Diagnosis not present

## 2015-05-18 DIAGNOSIS — M6281 Muscle weakness (generalized): Secondary | ICD-10-CM | POA: Diagnosis not present

## 2015-05-20 DIAGNOSIS — F419 Anxiety disorder, unspecified: Secondary | ICD-10-CM | POA: Diagnosis not present

## 2015-05-20 DIAGNOSIS — G9009 Other idiopathic peripheral autonomic neuropathy: Secondary | ICD-10-CM | POA: Diagnosis not present

## 2015-05-20 DIAGNOSIS — I10 Essential (primary) hypertension: Secondary | ICD-10-CM | POA: Diagnosis not present

## 2015-05-20 DIAGNOSIS — Z9981 Dependence on supplemental oxygen: Secondary | ICD-10-CM | POA: Diagnosis not present

## 2015-05-20 DIAGNOSIS — M6281 Muscle weakness (generalized): Secondary | ICD-10-CM | POA: Diagnosis not present

## 2015-05-20 DIAGNOSIS — J45909 Unspecified asthma, uncomplicated: Secondary | ICD-10-CM | POA: Diagnosis not present

## 2015-05-20 DIAGNOSIS — J441 Chronic obstructive pulmonary disease with (acute) exacerbation: Secondary | ICD-10-CM | POA: Diagnosis not present

## 2015-05-20 DIAGNOSIS — J15211 Pneumonia due to Methicillin susceptible Staphylococcus aureus: Secondary | ICD-10-CM | POA: Diagnosis not present

## 2015-05-20 DIAGNOSIS — F328 Other depressive episodes: Secondary | ICD-10-CM | POA: Diagnosis not present

## 2015-05-20 DIAGNOSIS — I5030 Unspecified diastolic (congestive) heart failure: Secondary | ICD-10-CM | POA: Diagnosis not present

## 2015-05-26 DIAGNOSIS — I5030 Unspecified diastolic (congestive) heart failure: Secondary | ICD-10-CM | POA: Diagnosis not present

## 2015-05-26 DIAGNOSIS — F419 Anxiety disorder, unspecified: Secondary | ICD-10-CM | POA: Diagnosis not present

## 2015-05-26 DIAGNOSIS — G9009 Other idiopathic peripheral autonomic neuropathy: Secondary | ICD-10-CM | POA: Diagnosis not present

## 2015-05-26 DIAGNOSIS — M6281 Muscle weakness (generalized): Secondary | ICD-10-CM | POA: Diagnosis not present

## 2015-05-26 DIAGNOSIS — J45909 Unspecified asthma, uncomplicated: Secondary | ICD-10-CM | POA: Diagnosis not present

## 2015-05-26 DIAGNOSIS — Z9981 Dependence on supplemental oxygen: Secondary | ICD-10-CM | POA: Diagnosis not present

## 2015-05-26 DIAGNOSIS — J15211 Pneumonia due to Methicillin susceptible Staphylococcus aureus: Secondary | ICD-10-CM | POA: Diagnosis not present

## 2015-05-26 DIAGNOSIS — J441 Chronic obstructive pulmonary disease with (acute) exacerbation: Secondary | ICD-10-CM | POA: Diagnosis not present

## 2015-05-26 DIAGNOSIS — F328 Other depressive episodes: Secondary | ICD-10-CM | POA: Diagnosis not present

## 2015-05-26 DIAGNOSIS — I10 Essential (primary) hypertension: Secondary | ICD-10-CM | POA: Diagnosis not present

## 2015-05-27 DIAGNOSIS — J45909 Unspecified asthma, uncomplicated: Secondary | ICD-10-CM | POA: Diagnosis not present

## 2015-05-27 DIAGNOSIS — F328 Other depressive episodes: Secondary | ICD-10-CM | POA: Diagnosis not present

## 2015-05-27 DIAGNOSIS — Z9981 Dependence on supplemental oxygen: Secondary | ICD-10-CM | POA: Diagnosis not present

## 2015-05-27 DIAGNOSIS — J441 Chronic obstructive pulmonary disease with (acute) exacerbation: Secondary | ICD-10-CM | POA: Diagnosis not present

## 2015-05-27 DIAGNOSIS — F419 Anxiety disorder, unspecified: Secondary | ICD-10-CM | POA: Diagnosis not present

## 2015-05-27 DIAGNOSIS — I10 Essential (primary) hypertension: Secondary | ICD-10-CM | POA: Diagnosis not present

## 2015-05-27 DIAGNOSIS — I5030 Unspecified diastolic (congestive) heart failure: Secondary | ICD-10-CM | POA: Diagnosis not present

## 2015-05-27 DIAGNOSIS — G9009 Other idiopathic peripheral autonomic neuropathy: Secondary | ICD-10-CM | POA: Diagnosis not present

## 2015-05-27 DIAGNOSIS — M6281 Muscle weakness (generalized): Secondary | ICD-10-CM | POA: Diagnosis not present

## 2015-05-27 DIAGNOSIS — J15211 Pneumonia due to Methicillin susceptible Staphylococcus aureus: Secondary | ICD-10-CM | POA: Diagnosis not present

## 2015-05-28 DIAGNOSIS — M6281 Muscle weakness (generalized): Secondary | ICD-10-CM | POA: Diagnosis not present

## 2015-05-28 DIAGNOSIS — F328 Other depressive episodes: Secondary | ICD-10-CM | POA: Diagnosis not present

## 2015-05-28 DIAGNOSIS — I10 Essential (primary) hypertension: Secondary | ICD-10-CM | POA: Diagnosis not present

## 2015-05-28 DIAGNOSIS — F419 Anxiety disorder, unspecified: Secondary | ICD-10-CM | POA: Diagnosis not present

## 2015-05-28 DIAGNOSIS — Z9981 Dependence on supplemental oxygen: Secondary | ICD-10-CM | POA: Diagnosis not present

## 2015-05-28 DIAGNOSIS — J441 Chronic obstructive pulmonary disease with (acute) exacerbation: Secondary | ICD-10-CM | POA: Diagnosis not present

## 2015-05-28 DIAGNOSIS — G9009 Other idiopathic peripheral autonomic neuropathy: Secondary | ICD-10-CM | POA: Diagnosis not present

## 2015-05-28 DIAGNOSIS — J15211 Pneumonia due to Methicillin susceptible Staphylococcus aureus: Secondary | ICD-10-CM | POA: Diagnosis not present

## 2015-05-28 DIAGNOSIS — I5030 Unspecified diastolic (congestive) heart failure: Secondary | ICD-10-CM | POA: Diagnosis not present

## 2015-05-28 DIAGNOSIS — J45909 Unspecified asthma, uncomplicated: Secondary | ICD-10-CM | POA: Diagnosis not present

## 2015-05-29 DIAGNOSIS — J471 Bronchiectasis with (acute) exacerbation: Secondary | ICD-10-CM | POA: Diagnosis not present

## 2015-05-31 DIAGNOSIS — Z9981 Dependence on supplemental oxygen: Secondary | ICD-10-CM | POA: Diagnosis not present

## 2015-05-31 DIAGNOSIS — G9009 Other idiopathic peripheral autonomic neuropathy: Secondary | ICD-10-CM | POA: Diagnosis not present

## 2015-05-31 DIAGNOSIS — I5030 Unspecified diastolic (congestive) heart failure: Secondary | ICD-10-CM | POA: Diagnosis not present

## 2015-05-31 DIAGNOSIS — F419 Anxiety disorder, unspecified: Secondary | ICD-10-CM | POA: Diagnosis not present

## 2015-05-31 DIAGNOSIS — I10 Essential (primary) hypertension: Secondary | ICD-10-CM | POA: Diagnosis not present

## 2015-05-31 DIAGNOSIS — J45909 Unspecified asthma, uncomplicated: Secondary | ICD-10-CM | POA: Diagnosis not present

## 2015-05-31 DIAGNOSIS — M6281 Muscle weakness (generalized): Secondary | ICD-10-CM | POA: Diagnosis not present

## 2015-05-31 DIAGNOSIS — J441 Chronic obstructive pulmonary disease with (acute) exacerbation: Secondary | ICD-10-CM | POA: Diagnosis not present

## 2015-05-31 DIAGNOSIS — J15211 Pneumonia due to Methicillin susceptible Staphylococcus aureus: Secondary | ICD-10-CM | POA: Diagnosis not present

## 2015-05-31 DIAGNOSIS — F328 Other depressive episodes: Secondary | ICD-10-CM | POA: Diagnosis not present

## 2015-06-02 DIAGNOSIS — M6281 Muscle weakness (generalized): Secondary | ICD-10-CM | POA: Diagnosis not present

## 2015-06-02 DIAGNOSIS — J441 Chronic obstructive pulmonary disease with (acute) exacerbation: Secondary | ICD-10-CM | POA: Diagnosis not present

## 2015-06-02 DIAGNOSIS — J15211 Pneumonia due to Methicillin susceptible Staphylococcus aureus: Secondary | ICD-10-CM | POA: Diagnosis not present

## 2015-06-02 DIAGNOSIS — Z9981 Dependence on supplemental oxygen: Secondary | ICD-10-CM | POA: Diagnosis not present

## 2015-06-02 DIAGNOSIS — J45909 Unspecified asthma, uncomplicated: Secondary | ICD-10-CM | POA: Diagnosis not present

## 2015-06-02 DIAGNOSIS — I5030 Unspecified diastolic (congestive) heart failure: Secondary | ICD-10-CM | POA: Diagnosis not present

## 2015-06-02 DIAGNOSIS — F328 Other depressive episodes: Secondary | ICD-10-CM | POA: Diagnosis not present

## 2015-06-02 DIAGNOSIS — G9009 Other idiopathic peripheral autonomic neuropathy: Secondary | ICD-10-CM | POA: Diagnosis not present

## 2015-06-02 DIAGNOSIS — I10 Essential (primary) hypertension: Secondary | ICD-10-CM | POA: Diagnosis not present

## 2015-06-02 DIAGNOSIS — F419 Anxiety disorder, unspecified: Secondary | ICD-10-CM | POA: Diagnosis not present

## 2015-06-04 DIAGNOSIS — G9009 Other idiopathic peripheral autonomic neuropathy: Secondary | ICD-10-CM | POA: Diagnosis not present

## 2015-06-04 DIAGNOSIS — F419 Anxiety disorder, unspecified: Secondary | ICD-10-CM | POA: Diagnosis not present

## 2015-06-04 DIAGNOSIS — J45909 Unspecified asthma, uncomplicated: Secondary | ICD-10-CM | POA: Diagnosis not present

## 2015-06-04 DIAGNOSIS — I10 Essential (primary) hypertension: Secondary | ICD-10-CM | POA: Diagnosis not present

## 2015-06-04 DIAGNOSIS — J441 Chronic obstructive pulmonary disease with (acute) exacerbation: Secondary | ICD-10-CM | POA: Diagnosis not present

## 2015-06-04 DIAGNOSIS — F328 Other depressive episodes: Secondary | ICD-10-CM | POA: Diagnosis not present

## 2015-06-04 DIAGNOSIS — J15211 Pneumonia due to Methicillin susceptible Staphylococcus aureus: Secondary | ICD-10-CM | POA: Diagnosis not present

## 2015-06-04 DIAGNOSIS — Z9981 Dependence on supplemental oxygen: Secondary | ICD-10-CM | POA: Diagnosis not present

## 2015-06-04 DIAGNOSIS — I5030 Unspecified diastolic (congestive) heart failure: Secondary | ICD-10-CM | POA: Diagnosis not present

## 2015-06-04 DIAGNOSIS — M6281 Muscle weakness (generalized): Secondary | ICD-10-CM | POA: Diagnosis not present

## 2015-06-16 DIAGNOSIS — I10 Essential (primary) hypertension: Secondary | ICD-10-CM | POA: Diagnosis not present

## 2015-06-16 DIAGNOSIS — F328 Other depressive episodes: Secondary | ICD-10-CM | POA: Diagnosis not present

## 2015-06-16 DIAGNOSIS — F411 Generalized anxiety disorder: Secondary | ICD-10-CM | POA: Diagnosis not present

## 2015-06-16 DIAGNOSIS — I5032 Chronic diastolic (congestive) heart failure: Secondary | ICD-10-CM | POA: Diagnosis not present

## 2015-06-16 DIAGNOSIS — F17210 Nicotine dependence, cigarettes, uncomplicated: Secondary | ICD-10-CM | POA: Diagnosis not present

## 2015-06-16 DIAGNOSIS — M6281 Muscle weakness (generalized): Secondary | ICD-10-CM | POA: Diagnosis not present

## 2015-06-16 DIAGNOSIS — R2689 Other abnormalities of gait and mobility: Secondary | ICD-10-CM | POA: Diagnosis not present

## 2015-06-16 DIAGNOSIS — Z8701 Personal history of pneumonia (recurrent): Secondary | ICD-10-CM | POA: Diagnosis not present

## 2015-06-16 DIAGNOSIS — J441 Chronic obstructive pulmonary disease with (acute) exacerbation: Secondary | ICD-10-CM | POA: Diagnosis not present

## 2015-06-16 DIAGNOSIS — G9009 Other idiopathic peripheral autonomic neuropathy: Secondary | ICD-10-CM | POA: Diagnosis not present

## 2015-06-16 DIAGNOSIS — J45909 Unspecified asthma, uncomplicated: Secondary | ICD-10-CM | POA: Diagnosis not present

## 2015-06-16 DIAGNOSIS — Z9981 Dependence on supplemental oxygen: Secondary | ICD-10-CM | POA: Diagnosis not present

## 2015-06-16 DIAGNOSIS — Z9181 History of falling: Secondary | ICD-10-CM | POA: Diagnosis not present

## 2015-06-17 DIAGNOSIS — Z8701 Personal history of pneumonia (recurrent): Secondary | ICD-10-CM | POA: Diagnosis not present

## 2015-06-17 DIAGNOSIS — M6281 Muscle weakness (generalized): Secondary | ICD-10-CM | POA: Diagnosis not present

## 2015-06-17 DIAGNOSIS — I10 Essential (primary) hypertension: Secondary | ICD-10-CM | POA: Diagnosis not present

## 2015-06-17 DIAGNOSIS — J45909 Unspecified asthma, uncomplicated: Secondary | ICD-10-CM | POA: Diagnosis not present

## 2015-06-17 DIAGNOSIS — F411 Generalized anxiety disorder: Secondary | ICD-10-CM | POA: Diagnosis not present

## 2015-06-17 DIAGNOSIS — G9009 Other idiopathic peripheral autonomic neuropathy: Secondary | ICD-10-CM | POA: Diagnosis not present

## 2015-06-17 DIAGNOSIS — Z9981 Dependence on supplemental oxygen: Secondary | ICD-10-CM | POA: Diagnosis not present

## 2015-06-17 DIAGNOSIS — R2689 Other abnormalities of gait and mobility: Secondary | ICD-10-CM | POA: Diagnosis not present

## 2015-06-17 DIAGNOSIS — Z9181 History of falling: Secondary | ICD-10-CM | POA: Diagnosis not present

## 2015-06-17 DIAGNOSIS — F17210 Nicotine dependence, cigarettes, uncomplicated: Secondary | ICD-10-CM | POA: Diagnosis not present

## 2015-06-17 DIAGNOSIS — I5032 Chronic diastolic (congestive) heart failure: Secondary | ICD-10-CM | POA: Diagnosis not present

## 2015-06-17 DIAGNOSIS — F328 Other depressive episodes: Secondary | ICD-10-CM | POA: Diagnosis not present

## 2015-06-17 DIAGNOSIS — J441 Chronic obstructive pulmonary disease with (acute) exacerbation: Secondary | ICD-10-CM | POA: Diagnosis not present

## 2015-06-18 DIAGNOSIS — F17210 Nicotine dependence, cigarettes, uncomplicated: Secondary | ICD-10-CM | POA: Diagnosis not present

## 2015-06-18 DIAGNOSIS — J45909 Unspecified asthma, uncomplicated: Secondary | ICD-10-CM | POA: Diagnosis not present

## 2015-06-18 DIAGNOSIS — M6281 Muscle weakness (generalized): Secondary | ICD-10-CM | POA: Diagnosis not present

## 2015-06-18 DIAGNOSIS — F328 Other depressive episodes: Secondary | ICD-10-CM | POA: Diagnosis not present

## 2015-06-18 DIAGNOSIS — F411 Generalized anxiety disorder: Secondary | ICD-10-CM | POA: Diagnosis not present

## 2015-06-18 DIAGNOSIS — Z9981 Dependence on supplemental oxygen: Secondary | ICD-10-CM | POA: Diagnosis not present

## 2015-06-18 DIAGNOSIS — J441 Chronic obstructive pulmonary disease with (acute) exacerbation: Secondary | ICD-10-CM | POA: Diagnosis not present

## 2015-06-18 DIAGNOSIS — Z9181 History of falling: Secondary | ICD-10-CM | POA: Diagnosis not present

## 2015-06-18 DIAGNOSIS — Z8701 Personal history of pneumonia (recurrent): Secondary | ICD-10-CM | POA: Diagnosis not present

## 2015-06-18 DIAGNOSIS — G9009 Other idiopathic peripheral autonomic neuropathy: Secondary | ICD-10-CM | POA: Diagnosis not present

## 2015-06-18 DIAGNOSIS — R2689 Other abnormalities of gait and mobility: Secondary | ICD-10-CM | POA: Diagnosis not present

## 2015-06-18 DIAGNOSIS — I5032 Chronic diastolic (congestive) heart failure: Secondary | ICD-10-CM | POA: Diagnosis not present

## 2015-06-18 DIAGNOSIS — I10 Essential (primary) hypertension: Secondary | ICD-10-CM | POA: Diagnosis not present

## 2015-06-22 DIAGNOSIS — J45909 Unspecified asthma, uncomplicated: Secondary | ICD-10-CM | POA: Diagnosis not present

## 2015-06-22 DIAGNOSIS — M6281 Muscle weakness (generalized): Secondary | ICD-10-CM | POA: Diagnosis not present

## 2015-06-22 DIAGNOSIS — I10 Essential (primary) hypertension: Secondary | ICD-10-CM | POA: Diagnosis not present

## 2015-06-22 DIAGNOSIS — R2689 Other abnormalities of gait and mobility: Secondary | ICD-10-CM | POA: Diagnosis not present

## 2015-06-22 DIAGNOSIS — Z8701 Personal history of pneumonia (recurrent): Secondary | ICD-10-CM | POA: Diagnosis not present

## 2015-06-22 DIAGNOSIS — F411 Generalized anxiety disorder: Secondary | ICD-10-CM | POA: Diagnosis not present

## 2015-06-22 DIAGNOSIS — Z9181 History of falling: Secondary | ICD-10-CM | POA: Diagnosis not present

## 2015-06-22 DIAGNOSIS — F17210 Nicotine dependence, cigarettes, uncomplicated: Secondary | ICD-10-CM | POA: Diagnosis not present

## 2015-06-22 DIAGNOSIS — G9009 Other idiopathic peripheral autonomic neuropathy: Secondary | ICD-10-CM | POA: Diagnosis not present

## 2015-06-22 DIAGNOSIS — I5032 Chronic diastolic (congestive) heart failure: Secondary | ICD-10-CM | POA: Diagnosis not present

## 2015-06-22 DIAGNOSIS — J441 Chronic obstructive pulmonary disease with (acute) exacerbation: Secondary | ICD-10-CM | POA: Diagnosis not present

## 2015-06-22 DIAGNOSIS — F3289 Other specified depressive episodes: Secondary | ICD-10-CM | POA: Diagnosis not present

## 2015-06-22 DIAGNOSIS — Z9981 Dependence on supplemental oxygen: Secondary | ICD-10-CM | POA: Diagnosis not present

## 2015-06-25 DIAGNOSIS — F411 Generalized anxiety disorder: Secondary | ICD-10-CM | POA: Diagnosis not present

## 2015-06-25 DIAGNOSIS — Z8701 Personal history of pneumonia (recurrent): Secondary | ICD-10-CM | POA: Diagnosis not present

## 2015-06-25 DIAGNOSIS — M6281 Muscle weakness (generalized): Secondary | ICD-10-CM | POA: Diagnosis not present

## 2015-06-25 DIAGNOSIS — I10 Essential (primary) hypertension: Secondary | ICD-10-CM | POA: Diagnosis not present

## 2015-06-25 DIAGNOSIS — F17210 Nicotine dependence, cigarettes, uncomplicated: Secondary | ICD-10-CM | POA: Diagnosis not present

## 2015-06-25 DIAGNOSIS — F3289 Other specified depressive episodes: Secondary | ICD-10-CM | POA: Diagnosis not present

## 2015-06-25 DIAGNOSIS — J441 Chronic obstructive pulmonary disease with (acute) exacerbation: Secondary | ICD-10-CM | POA: Diagnosis not present

## 2015-06-25 DIAGNOSIS — I5032 Chronic diastolic (congestive) heart failure: Secondary | ICD-10-CM | POA: Diagnosis not present

## 2015-06-25 DIAGNOSIS — J45909 Unspecified asthma, uncomplicated: Secondary | ICD-10-CM | POA: Diagnosis not present

## 2015-06-25 DIAGNOSIS — Z9181 History of falling: Secondary | ICD-10-CM | POA: Diagnosis not present

## 2015-06-25 DIAGNOSIS — G9009 Other idiopathic peripheral autonomic neuropathy: Secondary | ICD-10-CM | POA: Diagnosis not present

## 2015-06-25 DIAGNOSIS — R2689 Other abnormalities of gait and mobility: Secondary | ICD-10-CM | POA: Diagnosis not present

## 2015-06-25 DIAGNOSIS — Z9981 Dependence on supplemental oxygen: Secondary | ICD-10-CM | POA: Diagnosis not present

## 2015-06-28 DIAGNOSIS — R2689 Other abnormalities of gait and mobility: Secondary | ICD-10-CM | POA: Diagnosis not present

## 2015-06-28 DIAGNOSIS — J45909 Unspecified asthma, uncomplicated: Secondary | ICD-10-CM | POA: Diagnosis not present

## 2015-06-28 DIAGNOSIS — Z8701 Personal history of pneumonia (recurrent): Secondary | ICD-10-CM | POA: Diagnosis not present

## 2015-06-28 DIAGNOSIS — J441 Chronic obstructive pulmonary disease with (acute) exacerbation: Secondary | ICD-10-CM | POA: Diagnosis not present

## 2015-06-28 DIAGNOSIS — Z9181 History of falling: Secondary | ICD-10-CM | POA: Diagnosis not present

## 2015-06-28 DIAGNOSIS — J471 Bronchiectasis with (acute) exacerbation: Secondary | ICD-10-CM | POA: Diagnosis not present

## 2015-06-28 DIAGNOSIS — I10 Essential (primary) hypertension: Secondary | ICD-10-CM | POA: Diagnosis not present

## 2015-06-28 DIAGNOSIS — F17210 Nicotine dependence, cigarettes, uncomplicated: Secondary | ICD-10-CM | POA: Diagnosis not present

## 2015-06-28 DIAGNOSIS — G9009 Other idiopathic peripheral autonomic neuropathy: Secondary | ICD-10-CM | POA: Diagnosis not present

## 2015-06-28 DIAGNOSIS — F411 Generalized anxiety disorder: Secondary | ICD-10-CM | POA: Diagnosis not present

## 2015-06-28 DIAGNOSIS — Z9981 Dependence on supplemental oxygen: Secondary | ICD-10-CM | POA: Diagnosis not present

## 2015-06-28 DIAGNOSIS — M6281 Muscle weakness (generalized): Secondary | ICD-10-CM | POA: Diagnosis not present

## 2015-06-28 DIAGNOSIS — I5032 Chronic diastolic (congestive) heart failure: Secondary | ICD-10-CM | POA: Diagnosis not present

## 2015-06-28 DIAGNOSIS — F3289 Other specified depressive episodes: Secondary | ICD-10-CM | POA: Diagnosis not present

## 2015-06-29 DIAGNOSIS — J441 Chronic obstructive pulmonary disease with (acute) exacerbation: Secondary | ICD-10-CM | POA: Diagnosis not present

## 2015-06-29 DIAGNOSIS — F411 Generalized anxiety disorder: Secondary | ICD-10-CM | POA: Diagnosis not present

## 2015-06-29 DIAGNOSIS — Z8701 Personal history of pneumonia (recurrent): Secondary | ICD-10-CM | POA: Diagnosis not present

## 2015-06-29 DIAGNOSIS — Z9181 History of falling: Secondary | ICD-10-CM | POA: Diagnosis not present

## 2015-06-29 DIAGNOSIS — Z9981 Dependence on supplemental oxygen: Secondary | ICD-10-CM | POA: Diagnosis not present

## 2015-06-29 DIAGNOSIS — F3289 Other specified depressive episodes: Secondary | ICD-10-CM | POA: Diagnosis not present

## 2015-06-29 DIAGNOSIS — I5032 Chronic diastolic (congestive) heart failure: Secondary | ICD-10-CM | POA: Diagnosis not present

## 2015-06-29 DIAGNOSIS — R2689 Other abnormalities of gait and mobility: Secondary | ICD-10-CM | POA: Diagnosis not present

## 2015-06-29 DIAGNOSIS — M6281 Muscle weakness (generalized): Secondary | ICD-10-CM | POA: Diagnosis not present

## 2015-06-29 DIAGNOSIS — F17210 Nicotine dependence, cigarettes, uncomplicated: Secondary | ICD-10-CM | POA: Diagnosis not present

## 2015-06-29 DIAGNOSIS — G9009 Other idiopathic peripheral autonomic neuropathy: Secondary | ICD-10-CM | POA: Diagnosis not present

## 2015-06-29 DIAGNOSIS — I10 Essential (primary) hypertension: Secondary | ICD-10-CM | POA: Diagnosis not present

## 2015-06-29 DIAGNOSIS — J45909 Unspecified asthma, uncomplicated: Secondary | ICD-10-CM | POA: Diagnosis not present

## 2015-06-30 DIAGNOSIS — R2689 Other abnormalities of gait and mobility: Secondary | ICD-10-CM | POA: Diagnosis not present

## 2015-06-30 DIAGNOSIS — F17210 Nicotine dependence, cigarettes, uncomplicated: Secondary | ICD-10-CM | POA: Diagnosis not present

## 2015-06-30 DIAGNOSIS — Z9981 Dependence on supplemental oxygen: Secondary | ICD-10-CM | POA: Diagnosis not present

## 2015-06-30 DIAGNOSIS — F3289 Other specified depressive episodes: Secondary | ICD-10-CM | POA: Diagnosis not present

## 2015-06-30 DIAGNOSIS — J441 Chronic obstructive pulmonary disease with (acute) exacerbation: Secondary | ICD-10-CM | POA: Diagnosis not present

## 2015-06-30 DIAGNOSIS — J45909 Unspecified asthma, uncomplicated: Secondary | ICD-10-CM | POA: Diagnosis not present

## 2015-06-30 DIAGNOSIS — G9009 Other idiopathic peripheral autonomic neuropathy: Secondary | ICD-10-CM | POA: Diagnosis not present

## 2015-06-30 DIAGNOSIS — M6281 Muscle weakness (generalized): Secondary | ICD-10-CM | POA: Diagnosis not present

## 2015-06-30 DIAGNOSIS — I10 Essential (primary) hypertension: Secondary | ICD-10-CM | POA: Diagnosis not present

## 2015-06-30 DIAGNOSIS — Z8701 Personal history of pneumonia (recurrent): Secondary | ICD-10-CM | POA: Diagnosis not present

## 2015-06-30 DIAGNOSIS — I5032 Chronic diastolic (congestive) heart failure: Secondary | ICD-10-CM | POA: Diagnosis not present

## 2015-06-30 DIAGNOSIS — Z9181 History of falling: Secondary | ICD-10-CM | POA: Diagnosis not present

## 2015-06-30 DIAGNOSIS — F411 Generalized anxiety disorder: Secondary | ICD-10-CM | POA: Diagnosis not present

## 2015-07-01 DIAGNOSIS — F411 Generalized anxiety disorder: Secondary | ICD-10-CM | POA: Diagnosis not present

## 2015-07-01 DIAGNOSIS — J441 Chronic obstructive pulmonary disease with (acute) exacerbation: Secondary | ICD-10-CM | POA: Diagnosis not present

## 2015-07-01 DIAGNOSIS — F17210 Nicotine dependence, cigarettes, uncomplicated: Secondary | ICD-10-CM | POA: Diagnosis not present

## 2015-07-01 DIAGNOSIS — I5032 Chronic diastolic (congestive) heart failure: Secondary | ICD-10-CM | POA: Diagnosis not present

## 2015-07-01 DIAGNOSIS — M6281 Muscle weakness (generalized): Secondary | ICD-10-CM | POA: Diagnosis not present

## 2015-07-01 DIAGNOSIS — J45909 Unspecified asthma, uncomplicated: Secondary | ICD-10-CM | POA: Diagnosis not present

## 2015-07-01 DIAGNOSIS — R2689 Other abnormalities of gait and mobility: Secondary | ICD-10-CM | POA: Diagnosis not present

## 2015-07-01 DIAGNOSIS — G9009 Other idiopathic peripheral autonomic neuropathy: Secondary | ICD-10-CM | POA: Diagnosis not present

## 2015-07-01 DIAGNOSIS — Z8701 Personal history of pneumonia (recurrent): Secondary | ICD-10-CM | POA: Diagnosis not present

## 2015-07-01 DIAGNOSIS — I10 Essential (primary) hypertension: Secondary | ICD-10-CM | POA: Diagnosis not present

## 2015-07-01 DIAGNOSIS — F3289 Other specified depressive episodes: Secondary | ICD-10-CM | POA: Diagnosis not present

## 2015-07-01 DIAGNOSIS — Z9181 History of falling: Secondary | ICD-10-CM | POA: Diagnosis not present

## 2015-07-01 DIAGNOSIS — Z9981 Dependence on supplemental oxygen: Secondary | ICD-10-CM | POA: Diagnosis not present

## 2015-07-02 ENCOUNTER — Other Ambulatory Visit
Admission: RE | Admit: 2015-07-02 | Discharge: 2015-07-02 | Disposition: A | Discharge status: Home / Self Care | Payer: Medicare Other | Source: Ambulatory Visit | Attending: Physician Assistant | Admitting: Physician Assistant

## 2015-07-02 DIAGNOSIS — J45909 Unspecified asthma, uncomplicated: Secondary | ICD-10-CM | POA: Diagnosis not present

## 2015-07-02 DIAGNOSIS — I5032 Chronic diastolic (congestive) heart failure: Secondary | ICD-10-CM | POA: Diagnosis not present

## 2015-07-02 DIAGNOSIS — R2689 Other abnormalities of gait and mobility: Secondary | ICD-10-CM | POA: Diagnosis not present

## 2015-07-02 DIAGNOSIS — J449 Chronic obstructive pulmonary disease, unspecified: Secondary | ICD-10-CM | POA: Diagnosis not present

## 2015-07-02 DIAGNOSIS — I10 Essential (primary) hypertension: Secondary | ICD-10-CM | POA: Diagnosis not present

## 2015-07-02 DIAGNOSIS — Z0001 Encounter for general adult medical examination with abnormal findings: Secondary | ICD-10-CM | POA: Diagnosis not present

## 2015-07-02 DIAGNOSIS — M6281 Muscle weakness (generalized): Secondary | ICD-10-CM | POA: Diagnosis not present

## 2015-07-02 DIAGNOSIS — J15212 Pneumonia due to Methicillin resistant Staphylococcus aureus: Secondary | ICD-10-CM | POA: Diagnosis not present

## 2015-07-02 DIAGNOSIS — Z9181 History of falling: Secondary | ICD-10-CM | POA: Diagnosis not present

## 2015-07-02 DIAGNOSIS — F411 Generalized anxiety disorder: Secondary | ICD-10-CM | POA: Diagnosis not present

## 2015-07-02 DIAGNOSIS — G9009 Other idiopathic peripheral autonomic neuropathy: Secondary | ICD-10-CM | POA: Diagnosis not present

## 2015-07-02 DIAGNOSIS — Z9981 Dependence on supplemental oxygen: Secondary | ICD-10-CM | POA: Diagnosis not present

## 2015-07-02 DIAGNOSIS — J441 Chronic obstructive pulmonary disease with (acute) exacerbation: Secondary | ICD-10-CM | POA: Diagnosis not present

## 2015-07-02 DIAGNOSIS — F3289 Other specified depressive episodes: Secondary | ICD-10-CM | POA: Diagnosis not present

## 2015-07-02 DIAGNOSIS — F17210 Nicotine dependence, cigarettes, uncomplicated: Secondary | ICD-10-CM | POA: Diagnosis not present

## 2015-07-02 DIAGNOSIS — Z8701 Personal history of pneumonia (recurrent): Secondary | ICD-10-CM | POA: Diagnosis not present

## 2015-07-02 LAB — COMPREHENSIVE METABOLIC PANEL
ALBUMIN: 4.2 g/dL (ref 3.5–5.0)
ALT: 10 U/L — ABNORMAL LOW (ref 17–63)
AST: 21 U/L (ref 15–41)
Alkaline Phosphatase: 59 U/L (ref 38–126)
Anion gap: 9 (ref 5–15)
BILIRUBIN TOTAL: 1 mg/dL (ref 0.3–1.2)
BUN: 11 mg/dL (ref 6–20)
CHLORIDE: 105 mmol/L (ref 101–111)
CO2: 28 mmol/L (ref 22–32)
Calcium: 9.2 mg/dL (ref 8.9–10.3)
Creatinine, Ser: 1.1 mg/dL (ref 0.61–1.24)
GFR calc Af Amer: >60 mL/min (ref >60)
GFR calc non Af Amer: >60 mL/min (ref >60)
GLUCOSE: 110 mg/dL — AB (ref 65–99)
POTASSIUM: 3.6 mmol/L (ref 3.5–5.1)
Sodium: 142 mmol/L (ref 135–145)
TOTAL PROTEIN: 6.5 g/dL (ref 6.5–8.1)

## 2015-07-02 LAB — CBC WITH DIFFERENTIAL/PLATELET
Basophils Absolute: 0.1 10*3/uL (ref 0–0.1)
Basophils Relative: 1 %
EOS ABS: 0.1 10*3/uL (ref 0–0.7)
Eosinophils Relative: 1 %
HCT: 39 % — ABNORMAL LOW (ref 40.0–52.0)
Hemoglobin: 13 g/dL (ref 13.0–18.0)
Lymphocytes Relative: 20 %
Lymphs Abs: 1.5 10*3/uL (ref 1.0–3.6)
MCH: 31.9 pg (ref 26.0–34.0)
MCHC: 33.3 g/dL (ref 32.0–36.0)
MCV: 95.7 fL (ref 80.0–100.0)
MONO ABS: 0.5 10*3/uL (ref 0.2–1.0)
Monocytes Relative: 7 %
NEUTROS PCT: 71 %
Neutro Abs: 5.3 10*3/uL (ref 1.4–6.5)
PLATELETS: 112 10*3/uL — AB (ref 150–440)
RBC: 4.08 MIL/uL — AB (ref 4.40–5.90)
RDW: 14.1 % (ref 11.5–14.5)
SMEAR REVIEW: ADEQUATE
WBC: 7.5 10*3/uL (ref 3.8–10.6)

## 2015-07-02 LAB — LIPID PANEL
CHOL/HDL RATIO: 2.9 ratio
Cholesterol: 145 mg/dL (ref 0–200)
HDL: 50 mg/dL (ref >40)
LDL CALC: 83 mg/dL (ref 0–99)
Triglycerides: 60 mg/dL (ref <150)
VLDL: 12 mg/dL (ref 0–40)

## 2015-07-02 LAB — IRON AND TIBC
Iron: 85 ug/dL (ref 45–182)
SATURATION RATIOS: 29 % (ref 17.9–39.5)
TIBC: 290 ug/dL (ref 250–450)
UIBC: 205 ug/dL

## 2015-07-02 LAB — TSH: TSH: 1.112 u[IU]/mL (ref 0.350–4.500)

## 2015-07-02 LAB — PSA: PSA: 0.23 ng/mL (ref 0.00–4.00)

## 2015-07-02 LAB — FERRITIN: FERRITIN: 156 ng/mL (ref 24–336)

## 2015-07-03 LAB — T4: T4 TOTAL: 7.9 ug/dL (ref 4.5–12.0)

## 2015-07-05 DIAGNOSIS — J45909 Unspecified asthma, uncomplicated: Secondary | ICD-10-CM | POA: Diagnosis not present

## 2015-07-05 DIAGNOSIS — G9009 Other idiopathic peripheral autonomic neuropathy: Secondary | ICD-10-CM | POA: Diagnosis not present

## 2015-07-05 DIAGNOSIS — J441 Chronic obstructive pulmonary disease with (acute) exacerbation: Secondary | ICD-10-CM | POA: Diagnosis not present

## 2015-07-05 DIAGNOSIS — F411 Generalized anxiety disorder: Secondary | ICD-10-CM | POA: Diagnosis not present

## 2015-07-05 DIAGNOSIS — M6281 Muscle weakness (generalized): Secondary | ICD-10-CM | POA: Diagnosis not present

## 2015-07-05 DIAGNOSIS — Z9981 Dependence on supplemental oxygen: Secondary | ICD-10-CM | POA: Diagnosis not present

## 2015-07-05 DIAGNOSIS — F17210 Nicotine dependence, cigarettes, uncomplicated: Secondary | ICD-10-CM | POA: Diagnosis not present

## 2015-07-05 DIAGNOSIS — R2689 Other abnormalities of gait and mobility: Secondary | ICD-10-CM | POA: Diagnosis not present

## 2015-07-05 DIAGNOSIS — I10 Essential (primary) hypertension: Secondary | ICD-10-CM | POA: Diagnosis not present

## 2015-07-05 DIAGNOSIS — Z9181 History of falling: Secondary | ICD-10-CM | POA: Diagnosis not present

## 2015-07-05 DIAGNOSIS — Z8701 Personal history of pneumonia (recurrent): Secondary | ICD-10-CM | POA: Diagnosis not present

## 2015-07-05 DIAGNOSIS — F3289 Other specified depressive episodes: Secondary | ICD-10-CM | POA: Diagnosis not present

## 2015-07-05 DIAGNOSIS — I5032 Chronic diastolic (congestive) heart failure: Secondary | ICD-10-CM | POA: Diagnosis not present

## 2015-07-07 DIAGNOSIS — M6281 Muscle weakness (generalized): Secondary | ICD-10-CM | POA: Diagnosis not present

## 2015-07-07 DIAGNOSIS — Z9981 Dependence on supplemental oxygen: Secondary | ICD-10-CM | POA: Diagnosis not present

## 2015-07-07 DIAGNOSIS — F3289 Other specified depressive episodes: Secondary | ICD-10-CM | POA: Diagnosis not present

## 2015-07-07 DIAGNOSIS — J441 Chronic obstructive pulmonary disease with (acute) exacerbation: Secondary | ICD-10-CM | POA: Diagnosis not present

## 2015-07-07 DIAGNOSIS — F411 Generalized anxiety disorder: Secondary | ICD-10-CM | POA: Diagnosis not present

## 2015-07-07 DIAGNOSIS — Z9181 History of falling: Secondary | ICD-10-CM | POA: Diagnosis not present

## 2015-07-07 DIAGNOSIS — G9009 Other idiopathic peripheral autonomic neuropathy: Secondary | ICD-10-CM | POA: Diagnosis not present

## 2015-07-07 DIAGNOSIS — J45909 Unspecified asthma, uncomplicated: Secondary | ICD-10-CM | POA: Diagnosis not present

## 2015-07-07 DIAGNOSIS — F17210 Nicotine dependence, cigarettes, uncomplicated: Secondary | ICD-10-CM | POA: Diagnosis not present

## 2015-07-07 DIAGNOSIS — I10 Essential (primary) hypertension: Secondary | ICD-10-CM | POA: Diagnosis not present

## 2015-07-07 DIAGNOSIS — I5032 Chronic diastolic (congestive) heart failure: Secondary | ICD-10-CM | POA: Diagnosis not present

## 2015-07-07 DIAGNOSIS — Z8701 Personal history of pneumonia (recurrent): Secondary | ICD-10-CM | POA: Diagnosis not present

## 2015-07-07 DIAGNOSIS — R2689 Other abnormalities of gait and mobility: Secondary | ICD-10-CM | POA: Diagnosis not present

## 2015-07-09 ENCOUNTER — Other Ambulatory Visit: Payer: Self-pay | Admitting: Physician Assistant

## 2015-07-09 DIAGNOSIS — Z8701 Personal history of pneumonia (recurrent): Secondary | ICD-10-CM | POA: Diagnosis not present

## 2015-07-09 DIAGNOSIS — J45909 Unspecified asthma, uncomplicated: Secondary | ICD-10-CM | POA: Diagnosis not present

## 2015-07-09 DIAGNOSIS — R2689 Other abnormalities of gait and mobility: Secondary | ICD-10-CM | POA: Diagnosis not present

## 2015-07-09 DIAGNOSIS — F411 Generalized anxiety disorder: Secondary | ICD-10-CM | POA: Diagnosis not present

## 2015-07-09 DIAGNOSIS — R059 Cough, unspecified: Secondary | ICD-10-CM

## 2015-07-09 DIAGNOSIS — I5032 Chronic diastolic (congestive) heart failure: Secondary | ICD-10-CM | POA: Diagnosis not present

## 2015-07-09 DIAGNOSIS — J441 Chronic obstructive pulmonary disease with (acute) exacerbation: Secondary | ICD-10-CM | POA: Diagnosis not present

## 2015-07-09 DIAGNOSIS — Z9181 History of falling: Secondary | ICD-10-CM | POA: Diagnosis not present

## 2015-07-09 DIAGNOSIS — R05 Cough: Secondary | ICD-10-CM

## 2015-07-09 DIAGNOSIS — M6281 Muscle weakness (generalized): Secondary | ICD-10-CM | POA: Diagnosis not present

## 2015-07-09 DIAGNOSIS — G9009 Other idiopathic peripheral autonomic neuropathy: Secondary | ICD-10-CM | POA: Diagnosis not present

## 2015-07-09 DIAGNOSIS — I10 Essential (primary) hypertension: Secondary | ICD-10-CM | POA: Diagnosis not present

## 2015-07-09 DIAGNOSIS — F3289 Other specified depressive episodes: Secondary | ICD-10-CM | POA: Diagnosis not present

## 2015-07-09 DIAGNOSIS — Z9981 Dependence on supplemental oxygen: Secondary | ICD-10-CM | POA: Diagnosis not present

## 2015-07-09 DIAGNOSIS — F17210 Nicotine dependence, cigarettes, uncomplicated: Secondary | ICD-10-CM | POA: Diagnosis not present

## 2015-07-12 ENCOUNTER — Ambulatory Visit
Admission: RE | Admit: 2015-07-12 | Discharge: 2015-07-12 | Disposition: A | Discharge status: Home / Self Care | Payer: Medicare Other | Source: Ambulatory Visit | Attending: Physician Assistant | Admitting: Physician Assistant

## 2015-07-12 DIAGNOSIS — R059 Cough, unspecified: Secondary | ICD-10-CM

## 2015-07-12 DIAGNOSIS — R05 Cough: Secondary | ICD-10-CM | POA: Insufficient documentation

## 2015-07-12 DIAGNOSIS — J449 Chronic obstructive pulmonary disease, unspecified: Secondary | ICD-10-CM | POA: Diagnosis not present

## 2015-07-14 DIAGNOSIS — I5032 Chronic diastolic (congestive) heart failure: Secondary | ICD-10-CM | POA: Diagnosis not present

## 2015-07-14 DIAGNOSIS — G9009 Other idiopathic peripheral autonomic neuropathy: Secondary | ICD-10-CM | POA: Diagnosis not present

## 2015-07-14 DIAGNOSIS — R2689 Other abnormalities of gait and mobility: Secondary | ICD-10-CM | POA: Diagnosis not present

## 2015-07-14 DIAGNOSIS — J45909 Unspecified asthma, uncomplicated: Secondary | ICD-10-CM | POA: Diagnosis not present

## 2015-07-14 DIAGNOSIS — Z9981 Dependence on supplemental oxygen: Secondary | ICD-10-CM | POA: Diagnosis not present

## 2015-07-14 DIAGNOSIS — F411 Generalized anxiety disorder: Secondary | ICD-10-CM | POA: Diagnosis not present

## 2015-07-14 DIAGNOSIS — J441 Chronic obstructive pulmonary disease with (acute) exacerbation: Secondary | ICD-10-CM | POA: Diagnosis not present

## 2015-07-14 DIAGNOSIS — F17210 Nicotine dependence, cigarettes, uncomplicated: Secondary | ICD-10-CM | POA: Diagnosis not present

## 2015-07-14 DIAGNOSIS — Z9181 History of falling: Secondary | ICD-10-CM | POA: Diagnosis not present

## 2015-07-14 DIAGNOSIS — Z8701 Personal history of pneumonia (recurrent): Secondary | ICD-10-CM | POA: Diagnosis not present

## 2015-07-14 DIAGNOSIS — F3289 Other specified depressive episodes: Secondary | ICD-10-CM | POA: Diagnosis not present

## 2015-07-14 DIAGNOSIS — M6281 Muscle weakness (generalized): Secondary | ICD-10-CM | POA: Diagnosis not present

## 2015-07-14 DIAGNOSIS — I10 Essential (primary) hypertension: Secondary | ICD-10-CM | POA: Diagnosis not present

## 2015-07-16 DIAGNOSIS — F17210 Nicotine dependence, cigarettes, uncomplicated: Secondary | ICD-10-CM | POA: Diagnosis not present

## 2015-07-16 DIAGNOSIS — I10 Essential (primary) hypertension: Secondary | ICD-10-CM | POA: Diagnosis not present

## 2015-07-16 DIAGNOSIS — Z8701 Personal history of pneumonia (recurrent): Secondary | ICD-10-CM | POA: Diagnosis not present

## 2015-07-16 DIAGNOSIS — I5032 Chronic diastolic (congestive) heart failure: Secondary | ICD-10-CM | POA: Diagnosis not present

## 2015-07-16 DIAGNOSIS — J45909 Unspecified asthma, uncomplicated: Secondary | ICD-10-CM | POA: Diagnosis not present

## 2015-07-16 DIAGNOSIS — J441 Chronic obstructive pulmonary disease with (acute) exacerbation: Secondary | ICD-10-CM | POA: Diagnosis not present

## 2015-07-16 DIAGNOSIS — Z9181 History of falling: Secondary | ICD-10-CM | POA: Diagnosis not present

## 2015-07-16 DIAGNOSIS — F411 Generalized anxiety disorder: Secondary | ICD-10-CM | POA: Diagnosis not present

## 2015-07-16 DIAGNOSIS — M6281 Muscle weakness (generalized): Secondary | ICD-10-CM | POA: Diagnosis not present

## 2015-07-16 DIAGNOSIS — F3289 Other specified depressive episodes: Secondary | ICD-10-CM | POA: Diagnosis not present

## 2015-07-16 DIAGNOSIS — R2689 Other abnormalities of gait and mobility: Secondary | ICD-10-CM | POA: Diagnosis not present

## 2015-07-16 DIAGNOSIS — Z9981 Dependence on supplemental oxygen: Secondary | ICD-10-CM | POA: Diagnosis not present

## 2015-07-16 DIAGNOSIS — G9009 Other idiopathic peripheral autonomic neuropathy: Secondary | ICD-10-CM | POA: Diagnosis not present

## 2015-07-19 DIAGNOSIS — M6281 Muscle weakness (generalized): Secondary | ICD-10-CM | POA: Diagnosis not present

## 2015-07-19 DIAGNOSIS — F411 Generalized anxiety disorder: Secondary | ICD-10-CM | POA: Diagnosis not present

## 2015-07-19 DIAGNOSIS — F17210 Nicotine dependence, cigarettes, uncomplicated: Secondary | ICD-10-CM | POA: Diagnosis not present

## 2015-07-19 DIAGNOSIS — I10 Essential (primary) hypertension: Secondary | ICD-10-CM | POA: Diagnosis not present

## 2015-07-19 DIAGNOSIS — F3289 Other specified depressive episodes: Secondary | ICD-10-CM | POA: Diagnosis not present

## 2015-07-19 DIAGNOSIS — Z9981 Dependence on supplemental oxygen: Secondary | ICD-10-CM | POA: Diagnosis not present

## 2015-07-19 DIAGNOSIS — G9009 Other idiopathic peripheral autonomic neuropathy: Secondary | ICD-10-CM | POA: Diagnosis not present

## 2015-07-19 DIAGNOSIS — Z9181 History of falling: Secondary | ICD-10-CM | POA: Diagnosis not present

## 2015-07-19 DIAGNOSIS — R2689 Other abnormalities of gait and mobility: Secondary | ICD-10-CM | POA: Diagnosis not present

## 2015-07-19 DIAGNOSIS — I5032 Chronic diastolic (congestive) heart failure: Secondary | ICD-10-CM | POA: Diagnosis not present

## 2015-07-19 DIAGNOSIS — Z8701 Personal history of pneumonia (recurrent): Secondary | ICD-10-CM | POA: Diagnosis not present

## 2015-07-19 DIAGNOSIS — J441 Chronic obstructive pulmonary disease with (acute) exacerbation: Secondary | ICD-10-CM | POA: Diagnosis not present

## 2015-07-19 DIAGNOSIS — J45909 Unspecified asthma, uncomplicated: Secondary | ICD-10-CM | POA: Diagnosis not present

## 2015-07-21 DIAGNOSIS — J45909 Unspecified asthma, uncomplicated: Secondary | ICD-10-CM | POA: Diagnosis not present

## 2015-07-21 DIAGNOSIS — I5032 Chronic diastolic (congestive) heart failure: Secondary | ICD-10-CM | POA: Diagnosis not present

## 2015-07-21 DIAGNOSIS — M6281 Muscle weakness (generalized): Secondary | ICD-10-CM | POA: Diagnosis not present

## 2015-07-21 DIAGNOSIS — Z9981 Dependence on supplemental oxygen: Secondary | ICD-10-CM | POA: Diagnosis not present

## 2015-07-21 DIAGNOSIS — J441 Chronic obstructive pulmonary disease with (acute) exacerbation: Secondary | ICD-10-CM | POA: Diagnosis not present

## 2015-07-21 DIAGNOSIS — I10 Essential (primary) hypertension: Secondary | ICD-10-CM | POA: Diagnosis not present

## 2015-07-21 DIAGNOSIS — F411 Generalized anxiety disorder: Secondary | ICD-10-CM | POA: Diagnosis not present

## 2015-07-21 DIAGNOSIS — Z8701 Personal history of pneumonia (recurrent): Secondary | ICD-10-CM | POA: Diagnosis not present

## 2015-07-21 DIAGNOSIS — F3289 Other specified depressive episodes: Secondary | ICD-10-CM | POA: Diagnosis not present

## 2015-07-21 DIAGNOSIS — R2689 Other abnormalities of gait and mobility: Secondary | ICD-10-CM | POA: Diagnosis not present

## 2015-07-21 DIAGNOSIS — Z9181 History of falling: Secondary | ICD-10-CM | POA: Diagnosis not present

## 2015-07-21 DIAGNOSIS — F17210 Nicotine dependence, cigarettes, uncomplicated: Secondary | ICD-10-CM | POA: Diagnosis not present

## 2015-07-21 DIAGNOSIS — G9009 Other idiopathic peripheral autonomic neuropathy: Secondary | ICD-10-CM | POA: Diagnosis not present

## 2015-07-23 DIAGNOSIS — Z9181 History of falling: Secondary | ICD-10-CM | POA: Diagnosis not present

## 2015-07-23 DIAGNOSIS — Z9981 Dependence on supplemental oxygen: Secondary | ICD-10-CM | POA: Diagnosis not present

## 2015-07-23 DIAGNOSIS — J45909 Unspecified asthma, uncomplicated: Secondary | ICD-10-CM | POA: Diagnosis not present

## 2015-07-23 DIAGNOSIS — J441 Chronic obstructive pulmonary disease with (acute) exacerbation: Secondary | ICD-10-CM | POA: Diagnosis not present

## 2015-07-23 DIAGNOSIS — R2689 Other abnormalities of gait and mobility: Secondary | ICD-10-CM | POA: Diagnosis not present

## 2015-07-23 DIAGNOSIS — M6281 Muscle weakness (generalized): Secondary | ICD-10-CM | POA: Diagnosis not present

## 2015-07-23 DIAGNOSIS — I5032 Chronic diastolic (congestive) heart failure: Secondary | ICD-10-CM | POA: Diagnosis not present

## 2015-07-23 DIAGNOSIS — F3289 Other specified depressive episodes: Secondary | ICD-10-CM | POA: Diagnosis not present

## 2015-07-23 DIAGNOSIS — F17210 Nicotine dependence, cigarettes, uncomplicated: Secondary | ICD-10-CM | POA: Diagnosis not present

## 2015-07-23 DIAGNOSIS — Z8701 Personal history of pneumonia (recurrent): Secondary | ICD-10-CM | POA: Diagnosis not present

## 2015-07-23 DIAGNOSIS — I10 Essential (primary) hypertension: Secondary | ICD-10-CM | POA: Diagnosis not present

## 2015-07-23 DIAGNOSIS — F411 Generalized anxiety disorder: Secondary | ICD-10-CM | POA: Diagnosis not present

## 2015-07-23 DIAGNOSIS — G9009 Other idiopathic peripheral autonomic neuropathy: Secondary | ICD-10-CM | POA: Diagnosis not present

## 2015-07-26 DIAGNOSIS — F17210 Nicotine dependence, cigarettes, uncomplicated: Secondary | ICD-10-CM | POA: Diagnosis not present

## 2015-07-26 DIAGNOSIS — J45909 Unspecified asthma, uncomplicated: Secondary | ICD-10-CM | POA: Diagnosis not present

## 2015-07-26 DIAGNOSIS — J441 Chronic obstructive pulmonary disease with (acute) exacerbation: Secondary | ICD-10-CM | POA: Diagnosis not present

## 2015-07-26 DIAGNOSIS — R2689 Other abnormalities of gait and mobility: Secondary | ICD-10-CM | POA: Diagnosis not present

## 2015-07-26 DIAGNOSIS — Z8701 Personal history of pneumonia (recurrent): Secondary | ICD-10-CM | POA: Diagnosis not present

## 2015-07-26 DIAGNOSIS — Z9981 Dependence on supplemental oxygen: Secondary | ICD-10-CM | POA: Diagnosis not present

## 2015-07-26 DIAGNOSIS — I10 Essential (primary) hypertension: Secondary | ICD-10-CM | POA: Diagnosis not present

## 2015-07-26 DIAGNOSIS — Z9181 History of falling: Secondary | ICD-10-CM | POA: Diagnosis not present

## 2015-07-26 DIAGNOSIS — M6281 Muscle weakness (generalized): Secondary | ICD-10-CM | POA: Diagnosis not present

## 2015-07-26 DIAGNOSIS — F411 Generalized anxiety disorder: Secondary | ICD-10-CM | POA: Diagnosis not present

## 2015-07-26 DIAGNOSIS — F3289 Other specified depressive episodes: Secondary | ICD-10-CM | POA: Diagnosis not present

## 2015-07-26 DIAGNOSIS — G9009 Other idiopathic peripheral autonomic neuropathy: Secondary | ICD-10-CM | POA: Diagnosis not present

## 2015-07-26 DIAGNOSIS — I5032 Chronic diastolic (congestive) heart failure: Secondary | ICD-10-CM | POA: Diagnosis not present

## 2015-07-27 DIAGNOSIS — Z9981 Dependence on supplemental oxygen: Secondary | ICD-10-CM | POA: Diagnosis not present

## 2015-07-27 DIAGNOSIS — J45909 Unspecified asthma, uncomplicated: Secondary | ICD-10-CM | POA: Diagnosis not present

## 2015-07-27 DIAGNOSIS — R2689 Other abnormalities of gait and mobility: Secondary | ICD-10-CM | POA: Diagnosis not present

## 2015-07-27 DIAGNOSIS — I5032 Chronic diastolic (congestive) heart failure: Secondary | ICD-10-CM | POA: Diagnosis not present

## 2015-07-27 DIAGNOSIS — J441 Chronic obstructive pulmonary disease with (acute) exacerbation: Secondary | ICD-10-CM | POA: Diagnosis not present

## 2015-07-27 DIAGNOSIS — F411 Generalized anxiety disorder: Secondary | ICD-10-CM | POA: Diagnosis not present

## 2015-07-27 DIAGNOSIS — F17210 Nicotine dependence, cigarettes, uncomplicated: Secondary | ICD-10-CM | POA: Diagnosis not present

## 2015-07-27 DIAGNOSIS — M6281 Muscle weakness (generalized): Secondary | ICD-10-CM | POA: Diagnosis not present

## 2015-07-27 DIAGNOSIS — Z9181 History of falling: Secondary | ICD-10-CM | POA: Diagnosis not present

## 2015-07-27 DIAGNOSIS — F3289 Other specified depressive episodes: Secondary | ICD-10-CM | POA: Diagnosis not present

## 2015-07-27 DIAGNOSIS — G9009 Other idiopathic peripheral autonomic neuropathy: Secondary | ICD-10-CM | POA: Diagnosis not present

## 2015-07-27 DIAGNOSIS — I10 Essential (primary) hypertension: Secondary | ICD-10-CM | POA: Diagnosis not present

## 2015-07-27 DIAGNOSIS — Z8701 Personal history of pneumonia (recurrent): Secondary | ICD-10-CM | POA: Diagnosis not present

## 2015-07-28 DIAGNOSIS — F3289 Other specified depressive episodes: Secondary | ICD-10-CM | POA: Diagnosis not present

## 2015-07-28 DIAGNOSIS — G9009 Other idiopathic peripheral autonomic neuropathy: Secondary | ICD-10-CM | POA: Diagnosis not present

## 2015-07-28 DIAGNOSIS — J441 Chronic obstructive pulmonary disease with (acute) exacerbation: Secondary | ICD-10-CM | POA: Diagnosis not present

## 2015-07-28 DIAGNOSIS — Z9981 Dependence on supplemental oxygen: Secondary | ICD-10-CM | POA: Diagnosis not present

## 2015-07-28 DIAGNOSIS — Z8701 Personal history of pneumonia (recurrent): Secondary | ICD-10-CM | POA: Diagnosis not present

## 2015-07-28 DIAGNOSIS — F17210 Nicotine dependence, cigarettes, uncomplicated: Secondary | ICD-10-CM | POA: Diagnosis not present

## 2015-07-28 DIAGNOSIS — J45909 Unspecified asthma, uncomplicated: Secondary | ICD-10-CM | POA: Diagnosis not present

## 2015-07-28 DIAGNOSIS — F411 Generalized anxiety disorder: Secondary | ICD-10-CM | POA: Diagnosis not present

## 2015-07-28 DIAGNOSIS — R2689 Other abnormalities of gait and mobility: Secondary | ICD-10-CM | POA: Diagnosis not present

## 2015-07-28 DIAGNOSIS — Z9181 History of falling: Secondary | ICD-10-CM | POA: Diagnosis not present

## 2015-07-28 DIAGNOSIS — M6281 Muscle weakness (generalized): Secondary | ICD-10-CM | POA: Diagnosis not present

## 2015-07-28 DIAGNOSIS — I10 Essential (primary) hypertension: Secondary | ICD-10-CM | POA: Diagnosis not present

## 2015-07-28 DIAGNOSIS — I5032 Chronic diastolic (congestive) heart failure: Secondary | ICD-10-CM | POA: Diagnosis not present

## 2015-07-29 DIAGNOSIS — J471 Bronchiectasis with (acute) exacerbation: Secondary | ICD-10-CM | POA: Diagnosis not present

## 2015-07-29 DIAGNOSIS — J449 Chronic obstructive pulmonary disease, unspecified: Secondary | ICD-10-CM | POA: Diagnosis not present

## 2015-07-29 DIAGNOSIS — F17210 Nicotine dependence, cigarettes, uncomplicated: Secondary | ICD-10-CM | POA: Diagnosis not present

## 2015-07-29 DIAGNOSIS — J9611 Chronic respiratory failure with hypoxia: Secondary | ICD-10-CM | POA: Diagnosis not present

## 2015-07-29 DIAGNOSIS — J15212 Pneumonia due to Methicillin resistant Staphylococcus aureus: Secondary | ICD-10-CM | POA: Diagnosis not present

## 2015-07-30 DIAGNOSIS — Z8701 Personal history of pneumonia (recurrent): Secondary | ICD-10-CM | POA: Diagnosis not present

## 2015-07-30 DIAGNOSIS — Z9981 Dependence on supplemental oxygen: Secondary | ICD-10-CM | POA: Diagnosis not present

## 2015-07-30 DIAGNOSIS — I10 Essential (primary) hypertension: Secondary | ICD-10-CM | POA: Diagnosis not present

## 2015-07-30 DIAGNOSIS — M6281 Muscle weakness (generalized): Secondary | ICD-10-CM | POA: Diagnosis not present

## 2015-07-30 DIAGNOSIS — J441 Chronic obstructive pulmonary disease with (acute) exacerbation: Secondary | ICD-10-CM | POA: Diagnosis not present

## 2015-07-30 DIAGNOSIS — Z9181 History of falling: Secondary | ICD-10-CM | POA: Diagnosis not present

## 2015-07-30 DIAGNOSIS — J45909 Unspecified asthma, uncomplicated: Secondary | ICD-10-CM | POA: Diagnosis not present

## 2015-07-30 DIAGNOSIS — I5032 Chronic diastolic (congestive) heart failure: Secondary | ICD-10-CM | POA: Diagnosis not present

## 2015-07-30 DIAGNOSIS — F411 Generalized anxiety disorder: Secondary | ICD-10-CM | POA: Diagnosis not present

## 2015-07-30 DIAGNOSIS — F17210 Nicotine dependence, cigarettes, uncomplicated: Secondary | ICD-10-CM | POA: Diagnosis not present

## 2015-07-30 DIAGNOSIS — R2689 Other abnormalities of gait and mobility: Secondary | ICD-10-CM | POA: Diagnosis not present

## 2015-07-30 DIAGNOSIS — F3289 Other specified depressive episodes: Secondary | ICD-10-CM | POA: Diagnosis not present

## 2015-07-30 DIAGNOSIS — G9009 Other idiopathic peripheral autonomic neuropathy: Secondary | ICD-10-CM | POA: Diagnosis not present

## 2015-08-02 DIAGNOSIS — F329 Major depressive disorder, single episode, unspecified: Secondary | ICD-10-CM | POA: Diagnosis not present

## 2015-08-02 DIAGNOSIS — F17210 Nicotine dependence, cigarettes, uncomplicated: Secondary | ICD-10-CM | POA: Diagnosis not present

## 2015-08-02 DIAGNOSIS — J9610 Chronic respiratory failure, unspecified whether with hypoxia or hypercapnia: Secondary | ICD-10-CM | POA: Diagnosis not present

## 2015-08-02 DIAGNOSIS — F411 Generalized anxiety disorder: Secondary | ICD-10-CM | POA: Diagnosis not present

## 2015-08-02 DIAGNOSIS — Z9981 Dependence on supplemental oxygen: Secondary | ICD-10-CM | POA: Diagnosis not present

## 2015-08-02 DIAGNOSIS — Z8701 Personal history of pneumonia (recurrent): Secondary | ICD-10-CM | POA: Diagnosis not present

## 2015-08-02 DIAGNOSIS — Z8614 Personal history of Methicillin resistant Staphylococcus aureus infection: Secondary | ICD-10-CM | POA: Diagnosis not present

## 2015-08-02 DIAGNOSIS — I110 Hypertensive heart disease with heart failure: Secondary | ICD-10-CM | POA: Diagnosis not present

## 2015-08-02 DIAGNOSIS — G9009 Other idiopathic peripheral autonomic neuropathy: Secondary | ICD-10-CM | POA: Diagnosis not present

## 2015-08-02 DIAGNOSIS — I5032 Chronic diastolic (congestive) heart failure: Secondary | ICD-10-CM | POA: Diagnosis not present

## 2015-08-02 DIAGNOSIS — J441 Chronic obstructive pulmonary disease with (acute) exacerbation: Secondary | ICD-10-CM | POA: Diagnosis not present

## 2015-08-02 DIAGNOSIS — Z7951 Long term (current) use of inhaled steroids: Secondary | ICD-10-CM | POA: Diagnosis not present

## 2015-08-02 DIAGNOSIS — J45909 Unspecified asthma, uncomplicated: Secondary | ICD-10-CM | POA: Diagnosis not present

## 2015-08-04 DIAGNOSIS — I110 Hypertensive heart disease with heart failure: Secondary | ICD-10-CM | POA: Diagnosis not present

## 2015-08-04 DIAGNOSIS — Z7951 Long term (current) use of inhaled steroids: Secondary | ICD-10-CM | POA: Diagnosis not present

## 2015-08-04 DIAGNOSIS — Z8614 Personal history of Methicillin resistant Staphylococcus aureus infection: Secondary | ICD-10-CM | POA: Diagnosis not present

## 2015-08-04 DIAGNOSIS — Z9981 Dependence on supplemental oxygen: Secondary | ICD-10-CM | POA: Diagnosis not present

## 2015-08-04 DIAGNOSIS — G9009 Other idiopathic peripheral autonomic neuropathy: Secondary | ICD-10-CM | POA: Diagnosis not present

## 2015-08-04 DIAGNOSIS — Z8701 Personal history of pneumonia (recurrent): Secondary | ICD-10-CM | POA: Diagnosis not present

## 2015-08-04 DIAGNOSIS — F329 Major depressive disorder, single episode, unspecified: Secondary | ICD-10-CM | POA: Diagnosis not present

## 2015-08-04 DIAGNOSIS — J9610 Chronic respiratory failure, unspecified whether with hypoxia or hypercapnia: Secondary | ICD-10-CM | POA: Diagnosis not present

## 2015-08-04 DIAGNOSIS — F411 Generalized anxiety disorder: Secondary | ICD-10-CM | POA: Diagnosis not present

## 2015-08-04 DIAGNOSIS — J45909 Unspecified asthma, uncomplicated: Secondary | ICD-10-CM | POA: Diagnosis not present

## 2015-08-04 DIAGNOSIS — J441 Chronic obstructive pulmonary disease with (acute) exacerbation: Secondary | ICD-10-CM | POA: Diagnosis not present

## 2015-08-04 DIAGNOSIS — F17210 Nicotine dependence, cigarettes, uncomplicated: Secondary | ICD-10-CM | POA: Diagnosis not present

## 2015-08-04 DIAGNOSIS — I5032 Chronic diastolic (congestive) heart failure: Secondary | ICD-10-CM | POA: Diagnosis not present

## 2015-08-06 DIAGNOSIS — F411 Generalized anxiety disorder: Secondary | ICD-10-CM | POA: Diagnosis not present

## 2015-08-06 DIAGNOSIS — J441 Chronic obstructive pulmonary disease with (acute) exacerbation: Secondary | ICD-10-CM | POA: Diagnosis not present

## 2015-08-06 DIAGNOSIS — I5032 Chronic diastolic (congestive) heart failure: Secondary | ICD-10-CM | POA: Diagnosis not present

## 2015-08-06 DIAGNOSIS — I110 Hypertensive heart disease with heart failure: Secondary | ICD-10-CM | POA: Diagnosis not present

## 2015-08-06 DIAGNOSIS — J9610 Chronic respiratory failure, unspecified whether with hypoxia or hypercapnia: Secondary | ICD-10-CM | POA: Diagnosis not present

## 2015-08-06 DIAGNOSIS — F329 Major depressive disorder, single episode, unspecified: Secondary | ICD-10-CM | POA: Diagnosis not present

## 2015-08-06 DIAGNOSIS — Z7951 Long term (current) use of inhaled steroids: Secondary | ICD-10-CM | POA: Diagnosis not present

## 2015-08-06 DIAGNOSIS — F17210 Nicotine dependence, cigarettes, uncomplicated: Secondary | ICD-10-CM | POA: Diagnosis not present

## 2015-08-06 DIAGNOSIS — Z9981 Dependence on supplemental oxygen: Secondary | ICD-10-CM | POA: Diagnosis not present

## 2015-08-06 DIAGNOSIS — Z8614 Personal history of Methicillin resistant Staphylococcus aureus infection: Secondary | ICD-10-CM | POA: Diagnosis not present

## 2015-08-06 DIAGNOSIS — Z8701 Personal history of pneumonia (recurrent): Secondary | ICD-10-CM | POA: Diagnosis not present

## 2015-08-06 DIAGNOSIS — J45909 Unspecified asthma, uncomplicated: Secondary | ICD-10-CM | POA: Diagnosis not present

## 2015-08-06 DIAGNOSIS — G9009 Other idiopathic peripheral autonomic neuropathy: Secondary | ICD-10-CM | POA: Diagnosis not present

## 2015-08-11 DIAGNOSIS — Z8701 Personal history of pneumonia (recurrent): Secondary | ICD-10-CM | POA: Diagnosis not present

## 2015-08-11 DIAGNOSIS — Z9981 Dependence on supplemental oxygen: Secondary | ICD-10-CM | POA: Diagnosis not present

## 2015-08-11 DIAGNOSIS — I5032 Chronic diastolic (congestive) heart failure: Secondary | ICD-10-CM | POA: Diagnosis not present

## 2015-08-11 DIAGNOSIS — Z8614 Personal history of Methicillin resistant Staphylococcus aureus infection: Secondary | ICD-10-CM | POA: Diagnosis not present

## 2015-08-11 DIAGNOSIS — F329 Major depressive disorder, single episode, unspecified: Secondary | ICD-10-CM | POA: Diagnosis not present

## 2015-08-11 DIAGNOSIS — Z7951 Long term (current) use of inhaled steroids: Secondary | ICD-10-CM | POA: Diagnosis not present

## 2015-08-11 DIAGNOSIS — F411 Generalized anxiety disorder: Secondary | ICD-10-CM | POA: Diagnosis not present

## 2015-08-11 DIAGNOSIS — J45909 Unspecified asthma, uncomplicated: Secondary | ICD-10-CM | POA: Diagnosis not present

## 2015-08-11 DIAGNOSIS — J9610 Chronic respiratory failure, unspecified whether with hypoxia or hypercapnia: Secondary | ICD-10-CM | POA: Diagnosis not present

## 2015-08-11 DIAGNOSIS — J441 Chronic obstructive pulmonary disease with (acute) exacerbation: Secondary | ICD-10-CM | POA: Diagnosis not present

## 2015-08-11 DIAGNOSIS — F17210 Nicotine dependence, cigarettes, uncomplicated: Secondary | ICD-10-CM | POA: Diagnosis not present

## 2015-08-11 DIAGNOSIS — G9009 Other idiopathic peripheral autonomic neuropathy: Secondary | ICD-10-CM | POA: Diagnosis not present

## 2015-08-11 DIAGNOSIS — I110 Hypertensive heart disease with heart failure: Secondary | ICD-10-CM | POA: Diagnosis not present

## 2015-08-13 DIAGNOSIS — Z8701 Personal history of pneumonia (recurrent): Secondary | ICD-10-CM | POA: Diagnosis not present

## 2015-08-13 DIAGNOSIS — F17210 Nicotine dependence, cigarettes, uncomplicated: Secondary | ICD-10-CM | POA: Diagnosis not present

## 2015-08-13 DIAGNOSIS — Z7951 Long term (current) use of inhaled steroids: Secondary | ICD-10-CM | POA: Diagnosis not present

## 2015-08-13 DIAGNOSIS — G9009 Other idiopathic peripheral autonomic neuropathy: Secondary | ICD-10-CM | POA: Diagnosis not present

## 2015-08-13 DIAGNOSIS — J45909 Unspecified asthma, uncomplicated: Secondary | ICD-10-CM | POA: Diagnosis not present

## 2015-08-13 DIAGNOSIS — Z8614 Personal history of Methicillin resistant Staphylococcus aureus infection: Secondary | ICD-10-CM | POA: Diagnosis not present

## 2015-08-13 DIAGNOSIS — F329 Major depressive disorder, single episode, unspecified: Secondary | ICD-10-CM | POA: Diagnosis not present

## 2015-08-13 DIAGNOSIS — I5032 Chronic diastolic (congestive) heart failure: Secondary | ICD-10-CM | POA: Diagnosis not present

## 2015-08-13 DIAGNOSIS — I110 Hypertensive heart disease with heart failure: Secondary | ICD-10-CM | POA: Diagnosis not present

## 2015-08-13 DIAGNOSIS — Z9981 Dependence on supplemental oxygen: Secondary | ICD-10-CM | POA: Diagnosis not present

## 2015-08-13 DIAGNOSIS — F411 Generalized anxiety disorder: Secondary | ICD-10-CM | POA: Diagnosis not present

## 2015-08-13 DIAGNOSIS — J9610 Chronic respiratory failure, unspecified whether with hypoxia or hypercapnia: Secondary | ICD-10-CM | POA: Diagnosis not present

## 2015-08-13 DIAGNOSIS — J441 Chronic obstructive pulmonary disease with (acute) exacerbation: Secondary | ICD-10-CM | POA: Diagnosis not present

## 2015-08-16 DIAGNOSIS — Z8701 Personal history of pneumonia (recurrent): Secondary | ICD-10-CM | POA: Diagnosis not present

## 2015-08-16 DIAGNOSIS — Z8614 Personal history of Methicillin resistant Staphylococcus aureus infection: Secondary | ICD-10-CM | POA: Diagnosis not present

## 2015-08-16 DIAGNOSIS — Z7951 Long term (current) use of inhaled steroids: Secondary | ICD-10-CM | POA: Diagnosis not present

## 2015-08-16 DIAGNOSIS — F17210 Nicotine dependence, cigarettes, uncomplicated: Secondary | ICD-10-CM | POA: Diagnosis not present

## 2015-08-16 DIAGNOSIS — I5032 Chronic diastolic (congestive) heart failure: Secondary | ICD-10-CM | POA: Diagnosis not present

## 2015-08-16 DIAGNOSIS — J441 Chronic obstructive pulmonary disease with (acute) exacerbation: Secondary | ICD-10-CM | POA: Diagnosis not present

## 2015-08-16 DIAGNOSIS — I110 Hypertensive heart disease with heart failure: Secondary | ICD-10-CM | POA: Diagnosis not present

## 2015-08-16 DIAGNOSIS — J45909 Unspecified asthma, uncomplicated: Secondary | ICD-10-CM | POA: Diagnosis not present

## 2015-08-16 DIAGNOSIS — Z9981 Dependence on supplemental oxygen: Secondary | ICD-10-CM | POA: Diagnosis not present

## 2015-08-16 DIAGNOSIS — J9610 Chronic respiratory failure, unspecified whether with hypoxia or hypercapnia: Secondary | ICD-10-CM | POA: Diagnosis not present

## 2015-08-16 DIAGNOSIS — F329 Major depressive disorder, single episode, unspecified: Secondary | ICD-10-CM | POA: Diagnosis not present

## 2015-08-16 DIAGNOSIS — G9009 Other idiopathic peripheral autonomic neuropathy: Secondary | ICD-10-CM | POA: Diagnosis not present

## 2015-08-16 DIAGNOSIS — F411 Generalized anxiety disorder: Secondary | ICD-10-CM | POA: Diagnosis not present

## 2015-08-17 ENCOUNTER — Encounter: Payer: Self-pay | Admitting: Family Medicine

## 2015-08-17 ENCOUNTER — Other Ambulatory Visit: Payer: Self-pay | Admitting: Family Medicine

## 2015-08-17 DIAGNOSIS — J441 Chronic obstructive pulmonary disease with (acute) exacerbation: Secondary | ICD-10-CM | POA: Diagnosis not present

## 2015-08-17 DIAGNOSIS — J9610 Chronic respiratory failure, unspecified whether with hypoxia or hypercapnia: Secondary | ICD-10-CM | POA: Diagnosis not present

## 2015-08-17 DIAGNOSIS — I5032 Chronic diastolic (congestive) heart failure: Secondary | ICD-10-CM | POA: Diagnosis not present

## 2015-08-17 DIAGNOSIS — Z9981 Dependence on supplemental oxygen: Secondary | ICD-10-CM | POA: Diagnosis not present

## 2015-08-17 DIAGNOSIS — G9009 Other idiopathic peripheral autonomic neuropathy: Secondary | ICD-10-CM | POA: Diagnosis not present

## 2015-08-17 DIAGNOSIS — F329 Major depressive disorder, single episode, unspecified: Secondary | ICD-10-CM | POA: Diagnosis not present

## 2015-08-17 DIAGNOSIS — Z8614 Personal history of Methicillin resistant Staphylococcus aureus infection: Secondary | ICD-10-CM | POA: Diagnosis not present

## 2015-08-17 DIAGNOSIS — J45909 Unspecified asthma, uncomplicated: Secondary | ICD-10-CM | POA: Diagnosis not present

## 2015-08-17 DIAGNOSIS — Z87891 Personal history of nicotine dependence: Secondary | ICD-10-CM

## 2015-08-17 DIAGNOSIS — I110 Hypertensive heart disease with heart failure: Secondary | ICD-10-CM | POA: Diagnosis not present

## 2015-08-17 DIAGNOSIS — Z7951 Long term (current) use of inhaled steroids: Secondary | ICD-10-CM | POA: Diagnosis not present

## 2015-08-17 DIAGNOSIS — F17210 Nicotine dependence, cigarettes, uncomplicated: Secondary | ICD-10-CM | POA: Diagnosis not present

## 2015-08-17 DIAGNOSIS — F411 Generalized anxiety disorder: Secondary | ICD-10-CM | POA: Diagnosis not present

## 2015-08-17 DIAGNOSIS — Z8701 Personal history of pneumonia (recurrent): Secondary | ICD-10-CM | POA: Diagnosis not present

## 2015-08-17 HISTORY — DX: Personal history of nicotine dependence: Z87.891

## 2015-08-18 ENCOUNTER — Ambulatory Visit
Admission: RE | Admit: 2015-08-18 | Discharge: 2015-08-18 | Disposition: A | Discharge status: Home / Self Care | Payer: Medicare Other | Source: Ambulatory Visit | Attending: Family Medicine | Admitting: Family Medicine

## 2015-08-18 ENCOUNTER — Encounter: Payer: Self-pay | Admitting: Family Medicine

## 2015-08-18 ENCOUNTER — Inpatient Hospital Stay: Payer: Medicare Other | Attending: Family Medicine | Admitting: Family Medicine

## 2015-08-18 DIAGNOSIS — R918 Other nonspecific abnormal finding of lung field: Secondary | ICD-10-CM | POA: Insufficient documentation

## 2015-08-18 DIAGNOSIS — I313 Pericardial effusion (noninflammatory): Secondary | ICD-10-CM | POA: Diagnosis not present

## 2015-08-18 DIAGNOSIS — R911 Solitary pulmonary nodule: Secondary | ICD-10-CM | POA: Insufficient documentation

## 2015-08-18 DIAGNOSIS — Z122 Encounter for screening for malignant neoplasm of respiratory organs: Secondary | ICD-10-CM

## 2015-08-18 DIAGNOSIS — Z87891 Personal history of nicotine dependence: Secondary | ICD-10-CM | POA: Diagnosis not present

## 2015-08-18 DIAGNOSIS — J90 Pleural effusion, not elsewhere classified: Secondary | ICD-10-CM | POA: Diagnosis not present

## 2015-08-18 NOTE — Progress Notes (Signed)
In accordance with CMS guidelines, patient has meet eligibility criteria including age, absence of signs or symptoms of lung cancer, the specific calculation of cigarette smoking pack-years was 64.5 years and is a current smoker.   A shared decision-making session was conducted prior to the performance of CT scan. This includes one or more decision aids, includes benefits and harms of screening, follow-up diagnostic testing, over-diagnosis, false positive rate, and total radiation exposure.  Counseling on the importance of adherence to annual lung cancer LDCT screening, impact of co-morbidities, and ability or willingness to undergo diagnosis and treatment is imperative for compliance of the program.  Counseling on the importance of continued smoking cessation for former smokers; the importance of smoking cessation for current smokers and information about tobacco cessation interventions have been given to patient including the [place] at [facility], 1800 quit [place], as well as Cancer Center specific smoking cessation programs.  Written order for lung cancer screening with LDCT has been given to the patient and any and all questions have been answered to the best of my abilities.   Yearly follow up will be scheduled by Shawn Perkins, Thoracic Navigator.   

## 2015-08-20 DIAGNOSIS — I5032 Chronic diastolic (congestive) heart failure: Secondary | ICD-10-CM | POA: Diagnosis not present

## 2015-08-20 DIAGNOSIS — Z8701 Personal history of pneumonia (recurrent): Secondary | ICD-10-CM | POA: Diagnosis not present

## 2015-08-20 DIAGNOSIS — I110 Hypertensive heart disease with heart failure: Secondary | ICD-10-CM | POA: Diagnosis not present

## 2015-08-20 DIAGNOSIS — Z8614 Personal history of Methicillin resistant Staphylococcus aureus infection: Secondary | ICD-10-CM | POA: Diagnosis not present

## 2015-08-20 DIAGNOSIS — Z9981 Dependence on supplemental oxygen: Secondary | ICD-10-CM | POA: Diagnosis not present

## 2015-08-20 DIAGNOSIS — F329 Major depressive disorder, single episode, unspecified: Secondary | ICD-10-CM | POA: Diagnosis not present

## 2015-08-20 DIAGNOSIS — F17210 Nicotine dependence, cigarettes, uncomplicated: Secondary | ICD-10-CM | POA: Diagnosis not present

## 2015-08-20 DIAGNOSIS — F411 Generalized anxiety disorder: Secondary | ICD-10-CM | POA: Diagnosis not present

## 2015-08-20 DIAGNOSIS — J441 Chronic obstructive pulmonary disease with (acute) exacerbation: Secondary | ICD-10-CM | POA: Diagnosis not present

## 2015-08-20 DIAGNOSIS — J9610 Chronic respiratory failure, unspecified whether with hypoxia or hypercapnia: Secondary | ICD-10-CM | POA: Diagnosis not present

## 2015-08-20 DIAGNOSIS — J45909 Unspecified asthma, uncomplicated: Secondary | ICD-10-CM | POA: Diagnosis not present

## 2015-08-20 DIAGNOSIS — G9009 Other idiopathic peripheral autonomic neuropathy: Secondary | ICD-10-CM | POA: Diagnosis not present

## 2015-08-20 DIAGNOSIS — Z7951 Long term (current) use of inhaled steroids: Secondary | ICD-10-CM | POA: Diagnosis not present

## 2015-08-24 DIAGNOSIS — Z0001 Encounter for general adult medical examination with abnormal findings: Secondary | ICD-10-CM | POA: Diagnosis not present

## 2015-08-24 DIAGNOSIS — N401 Enlarged prostate with lower urinary tract symptoms: Secondary | ICD-10-CM | POA: Diagnosis not present

## 2015-08-24 DIAGNOSIS — E756 Lipid storage disorder, unspecified: Secondary | ICD-10-CM | POA: Diagnosis not present

## 2015-08-24 DIAGNOSIS — R972 Elevated prostate specific antigen [PSA]: Secondary | ICD-10-CM | POA: Diagnosis not present

## 2015-08-24 DIAGNOSIS — J9611 Chronic respiratory failure with hypoxia: Secondary | ICD-10-CM | POA: Diagnosis not present

## 2015-08-24 DIAGNOSIS — R5381 Other malaise: Secondary | ICD-10-CM | POA: Diagnosis not present

## 2015-08-24 DIAGNOSIS — J432 Centrilobular emphysema: Secondary | ICD-10-CM | POA: Diagnosis not present

## 2015-08-24 DIAGNOSIS — K648 Other hemorrhoids: Secondary | ICD-10-CM | POA: Diagnosis not present

## 2015-08-24 DIAGNOSIS — I10 Essential (primary) hypertension: Secondary | ICD-10-CM | POA: Diagnosis not present

## 2015-08-24 DIAGNOSIS — E0781 Sick-euthyroid syndrome: Secondary | ICD-10-CM | POA: Diagnosis not present

## 2015-08-24 DIAGNOSIS — R5383 Other fatigue: Secondary | ICD-10-CM | POA: Diagnosis not present

## 2015-08-24 DIAGNOSIS — D649 Anemia, unspecified: Secondary | ICD-10-CM | POA: Diagnosis not present

## 2015-08-28 DIAGNOSIS — J471 Bronchiectasis with (acute) exacerbation: Secondary | ICD-10-CM | POA: Diagnosis not present

## 2015-08-30 DIAGNOSIS — Z7951 Long term (current) use of inhaled steroids: Secondary | ICD-10-CM | POA: Diagnosis not present

## 2015-08-30 DIAGNOSIS — J45909 Unspecified asthma, uncomplicated: Secondary | ICD-10-CM | POA: Diagnosis not present

## 2015-08-30 DIAGNOSIS — J441 Chronic obstructive pulmonary disease with (acute) exacerbation: Secondary | ICD-10-CM | POA: Diagnosis not present

## 2015-08-30 DIAGNOSIS — F17210 Nicotine dependence, cigarettes, uncomplicated: Secondary | ICD-10-CM | POA: Diagnosis not present

## 2015-08-30 DIAGNOSIS — Z9981 Dependence on supplemental oxygen: Secondary | ICD-10-CM | POA: Diagnosis not present

## 2015-08-30 DIAGNOSIS — Z8701 Personal history of pneumonia (recurrent): Secondary | ICD-10-CM | POA: Diagnosis not present

## 2015-08-30 DIAGNOSIS — I5032 Chronic diastolic (congestive) heart failure: Secondary | ICD-10-CM | POA: Diagnosis not present

## 2015-08-30 DIAGNOSIS — I110 Hypertensive heart disease with heart failure: Secondary | ICD-10-CM | POA: Diagnosis not present

## 2015-08-30 DIAGNOSIS — F329 Major depressive disorder, single episode, unspecified: Secondary | ICD-10-CM | POA: Diagnosis not present

## 2015-08-30 DIAGNOSIS — G9009 Other idiopathic peripheral autonomic neuropathy: Secondary | ICD-10-CM | POA: Diagnosis not present

## 2015-08-30 DIAGNOSIS — F411 Generalized anxiety disorder: Secondary | ICD-10-CM | POA: Diagnosis not present

## 2015-08-30 DIAGNOSIS — J9610 Chronic respiratory failure, unspecified whether with hypoxia or hypercapnia: Secondary | ICD-10-CM | POA: Diagnosis not present

## 2015-08-30 DIAGNOSIS — Z8614 Personal history of Methicillin resistant Staphylococcus aureus infection: Secondary | ICD-10-CM | POA: Diagnosis not present

## 2015-08-31 DIAGNOSIS — F17210 Nicotine dependence, cigarettes, uncomplicated: Secondary | ICD-10-CM | POA: Diagnosis not present

## 2015-08-31 DIAGNOSIS — J441 Chronic obstructive pulmonary disease with (acute) exacerbation: Secondary | ICD-10-CM | POA: Diagnosis not present

## 2015-08-31 DIAGNOSIS — Z7951 Long term (current) use of inhaled steroids: Secondary | ICD-10-CM | POA: Diagnosis not present

## 2015-08-31 DIAGNOSIS — I5032 Chronic diastolic (congestive) heart failure: Secondary | ICD-10-CM | POA: Diagnosis not present

## 2015-08-31 DIAGNOSIS — Z8701 Personal history of pneumonia (recurrent): Secondary | ICD-10-CM | POA: Diagnosis not present

## 2015-08-31 DIAGNOSIS — J45909 Unspecified asthma, uncomplicated: Secondary | ICD-10-CM | POA: Diagnosis not present

## 2015-08-31 DIAGNOSIS — J9610 Chronic respiratory failure, unspecified whether with hypoxia or hypercapnia: Secondary | ICD-10-CM | POA: Diagnosis not present

## 2015-08-31 DIAGNOSIS — F411 Generalized anxiety disorder: Secondary | ICD-10-CM | POA: Diagnosis not present

## 2015-08-31 DIAGNOSIS — I110 Hypertensive heart disease with heart failure: Secondary | ICD-10-CM | POA: Diagnosis not present

## 2015-08-31 DIAGNOSIS — G9009 Other idiopathic peripheral autonomic neuropathy: Secondary | ICD-10-CM | POA: Diagnosis not present

## 2015-08-31 DIAGNOSIS — F329 Major depressive disorder, single episode, unspecified: Secondary | ICD-10-CM | POA: Diagnosis not present

## 2015-08-31 DIAGNOSIS — Z8614 Personal history of Methicillin resistant Staphylococcus aureus infection: Secondary | ICD-10-CM | POA: Diagnosis not present

## 2015-08-31 DIAGNOSIS — Z9981 Dependence on supplemental oxygen: Secondary | ICD-10-CM | POA: Diagnosis not present

## 2015-09-01 DIAGNOSIS — G9009 Other idiopathic peripheral autonomic neuropathy: Secondary | ICD-10-CM | POA: Diagnosis not present

## 2015-09-01 DIAGNOSIS — I110 Hypertensive heart disease with heart failure: Secondary | ICD-10-CM | POA: Diagnosis not present

## 2015-09-01 DIAGNOSIS — F17210 Nicotine dependence, cigarettes, uncomplicated: Secondary | ICD-10-CM | POA: Diagnosis not present

## 2015-09-01 DIAGNOSIS — F411 Generalized anxiety disorder: Secondary | ICD-10-CM | POA: Diagnosis not present

## 2015-09-01 DIAGNOSIS — J441 Chronic obstructive pulmonary disease with (acute) exacerbation: Secondary | ICD-10-CM | POA: Diagnosis not present

## 2015-09-01 DIAGNOSIS — Z9981 Dependence on supplemental oxygen: Secondary | ICD-10-CM | POA: Diagnosis not present

## 2015-09-01 DIAGNOSIS — Z7951 Long term (current) use of inhaled steroids: Secondary | ICD-10-CM | POA: Diagnosis not present

## 2015-09-01 DIAGNOSIS — J9610 Chronic respiratory failure, unspecified whether with hypoxia or hypercapnia: Secondary | ICD-10-CM | POA: Diagnosis not present

## 2015-09-01 DIAGNOSIS — I5032 Chronic diastolic (congestive) heart failure: Secondary | ICD-10-CM | POA: Diagnosis not present

## 2015-09-01 DIAGNOSIS — F329 Major depressive disorder, single episode, unspecified: Secondary | ICD-10-CM | POA: Diagnosis not present

## 2015-09-01 DIAGNOSIS — Z8701 Personal history of pneumonia (recurrent): Secondary | ICD-10-CM | POA: Diagnosis not present

## 2015-09-01 DIAGNOSIS — J45909 Unspecified asthma, uncomplicated: Secondary | ICD-10-CM | POA: Diagnosis not present

## 2015-09-01 DIAGNOSIS — Z8614 Personal history of Methicillin resistant Staphylococcus aureus infection: Secondary | ICD-10-CM | POA: Diagnosis not present

## 2015-09-06 DIAGNOSIS — I110 Hypertensive heart disease with heart failure: Secondary | ICD-10-CM | POA: Diagnosis not present

## 2015-09-06 DIAGNOSIS — G9009 Other idiopathic peripheral autonomic neuropathy: Secondary | ICD-10-CM | POA: Diagnosis not present

## 2015-09-06 DIAGNOSIS — F17210 Nicotine dependence, cigarettes, uncomplicated: Secondary | ICD-10-CM | POA: Diagnosis not present

## 2015-09-06 DIAGNOSIS — J441 Chronic obstructive pulmonary disease with (acute) exacerbation: Secondary | ICD-10-CM | POA: Diagnosis not present

## 2015-09-06 DIAGNOSIS — Z8701 Personal history of pneumonia (recurrent): Secondary | ICD-10-CM | POA: Diagnosis not present

## 2015-09-06 DIAGNOSIS — J9610 Chronic respiratory failure, unspecified whether with hypoxia or hypercapnia: Secondary | ICD-10-CM | POA: Diagnosis not present

## 2015-09-06 DIAGNOSIS — J45909 Unspecified asthma, uncomplicated: Secondary | ICD-10-CM | POA: Diagnosis not present

## 2015-09-06 DIAGNOSIS — Z7951 Long term (current) use of inhaled steroids: Secondary | ICD-10-CM | POA: Diagnosis not present

## 2015-09-06 DIAGNOSIS — F329 Major depressive disorder, single episode, unspecified: Secondary | ICD-10-CM | POA: Diagnosis not present

## 2015-09-06 DIAGNOSIS — Z8614 Personal history of Methicillin resistant Staphylococcus aureus infection: Secondary | ICD-10-CM | POA: Diagnosis not present

## 2015-09-06 DIAGNOSIS — I5032 Chronic diastolic (congestive) heart failure: Secondary | ICD-10-CM | POA: Diagnosis not present

## 2015-09-06 DIAGNOSIS — Z9981 Dependence on supplemental oxygen: Secondary | ICD-10-CM | POA: Diagnosis not present

## 2015-09-06 DIAGNOSIS — F411 Generalized anxiety disorder: Secondary | ICD-10-CM | POA: Diagnosis not present

## 2015-09-08 DIAGNOSIS — J441 Chronic obstructive pulmonary disease with (acute) exacerbation: Secondary | ICD-10-CM | POA: Diagnosis not present

## 2015-09-08 DIAGNOSIS — G9009 Other idiopathic peripheral autonomic neuropathy: Secondary | ICD-10-CM | POA: Diagnosis not present

## 2015-09-08 DIAGNOSIS — Z9981 Dependence on supplemental oxygen: Secondary | ICD-10-CM | POA: Diagnosis not present

## 2015-09-08 DIAGNOSIS — F17210 Nicotine dependence, cigarettes, uncomplicated: Secondary | ICD-10-CM | POA: Diagnosis not present

## 2015-09-08 DIAGNOSIS — Z7951 Long term (current) use of inhaled steroids: Secondary | ICD-10-CM | POA: Diagnosis not present

## 2015-09-08 DIAGNOSIS — J45909 Unspecified asthma, uncomplicated: Secondary | ICD-10-CM | POA: Diagnosis not present

## 2015-09-08 DIAGNOSIS — I5032 Chronic diastolic (congestive) heart failure: Secondary | ICD-10-CM | POA: Diagnosis not present

## 2015-09-08 DIAGNOSIS — J9610 Chronic respiratory failure, unspecified whether with hypoxia or hypercapnia: Secondary | ICD-10-CM | POA: Diagnosis not present

## 2015-09-08 DIAGNOSIS — I110 Hypertensive heart disease with heart failure: Secondary | ICD-10-CM | POA: Diagnosis not present

## 2015-09-08 DIAGNOSIS — Z8701 Personal history of pneumonia (recurrent): Secondary | ICD-10-CM | POA: Diagnosis not present

## 2015-09-08 DIAGNOSIS — Z8614 Personal history of Methicillin resistant Staphylococcus aureus infection: Secondary | ICD-10-CM | POA: Diagnosis not present

## 2015-09-08 DIAGNOSIS — F329 Major depressive disorder, single episode, unspecified: Secondary | ICD-10-CM | POA: Diagnosis not present

## 2015-09-08 DIAGNOSIS — F411 Generalized anxiety disorder: Secondary | ICD-10-CM | POA: Diagnosis not present

## 2015-09-10 DIAGNOSIS — Z7951 Long term (current) use of inhaled steroids: Secondary | ICD-10-CM | POA: Diagnosis not present

## 2015-09-10 DIAGNOSIS — J45909 Unspecified asthma, uncomplicated: Secondary | ICD-10-CM | POA: Diagnosis not present

## 2015-09-10 DIAGNOSIS — F411 Generalized anxiety disorder: Secondary | ICD-10-CM | POA: Diagnosis not present

## 2015-09-10 DIAGNOSIS — Z8614 Personal history of Methicillin resistant Staphylococcus aureus infection: Secondary | ICD-10-CM | POA: Diagnosis not present

## 2015-09-10 DIAGNOSIS — G9009 Other idiopathic peripheral autonomic neuropathy: Secondary | ICD-10-CM | POA: Diagnosis not present

## 2015-09-10 DIAGNOSIS — I110 Hypertensive heart disease with heart failure: Secondary | ICD-10-CM | POA: Diagnosis not present

## 2015-09-10 DIAGNOSIS — F329 Major depressive disorder, single episode, unspecified: Secondary | ICD-10-CM | POA: Diagnosis not present

## 2015-09-10 DIAGNOSIS — I5032 Chronic diastolic (congestive) heart failure: Secondary | ICD-10-CM | POA: Diagnosis not present

## 2015-09-10 DIAGNOSIS — J9610 Chronic respiratory failure, unspecified whether with hypoxia or hypercapnia: Secondary | ICD-10-CM | POA: Diagnosis not present

## 2015-09-10 DIAGNOSIS — J441 Chronic obstructive pulmonary disease with (acute) exacerbation: Secondary | ICD-10-CM | POA: Diagnosis not present

## 2015-09-10 DIAGNOSIS — F17210 Nicotine dependence, cigarettes, uncomplicated: Secondary | ICD-10-CM | POA: Diagnosis not present

## 2015-09-10 DIAGNOSIS — Z8701 Personal history of pneumonia (recurrent): Secondary | ICD-10-CM | POA: Diagnosis not present

## 2015-09-10 DIAGNOSIS — Z9981 Dependence on supplemental oxygen: Secondary | ICD-10-CM | POA: Diagnosis not present

## 2015-09-21 DIAGNOSIS — I5032 Chronic diastolic (congestive) heart failure: Secondary | ICD-10-CM | POA: Diagnosis not present

## 2015-09-21 DIAGNOSIS — J441 Chronic obstructive pulmonary disease with (acute) exacerbation: Secondary | ICD-10-CM | POA: Diagnosis not present

## 2015-09-21 DIAGNOSIS — J9610 Chronic respiratory failure, unspecified whether with hypoxia or hypercapnia: Secondary | ICD-10-CM | POA: Diagnosis not present

## 2015-09-21 DIAGNOSIS — I110 Hypertensive heart disease with heart failure: Secondary | ICD-10-CM | POA: Diagnosis not present

## 2015-09-22 DIAGNOSIS — J441 Chronic obstructive pulmonary disease with (acute) exacerbation: Secondary | ICD-10-CM | POA: Diagnosis not present

## 2015-09-22 DIAGNOSIS — I110 Hypertensive heart disease with heart failure: Secondary | ICD-10-CM | POA: Diagnosis not present

## 2015-09-22 DIAGNOSIS — J9610 Chronic respiratory failure, unspecified whether with hypoxia or hypercapnia: Secondary | ICD-10-CM | POA: Diagnosis not present

## 2015-09-22 DIAGNOSIS — I5032 Chronic diastolic (congestive) heart failure: Secondary | ICD-10-CM | POA: Diagnosis not present

## 2015-09-24 DIAGNOSIS — I110 Hypertensive heart disease with heart failure: Secondary | ICD-10-CM | POA: Diagnosis not present

## 2015-09-24 DIAGNOSIS — Z7951 Long term (current) use of inhaled steroids: Secondary | ICD-10-CM | POA: Diagnosis not present

## 2015-09-24 DIAGNOSIS — I5032 Chronic diastolic (congestive) heart failure: Secondary | ICD-10-CM | POA: Diagnosis not present

## 2015-09-24 DIAGNOSIS — Z9981 Dependence on supplemental oxygen: Secondary | ICD-10-CM | POA: Diagnosis not present

## 2015-09-24 DIAGNOSIS — Z9181 History of falling: Secondary | ICD-10-CM | POA: Diagnosis not present

## 2015-09-24 DIAGNOSIS — J9610 Chronic respiratory failure, unspecified whether with hypoxia or hypercapnia: Secondary | ICD-10-CM | POA: Diagnosis not present

## 2015-09-24 DIAGNOSIS — J45909 Unspecified asthma, uncomplicated: Secondary | ICD-10-CM | POA: Diagnosis not present

## 2015-09-24 DIAGNOSIS — G9009 Other idiopathic peripheral autonomic neuropathy: Secondary | ICD-10-CM | POA: Diagnosis not present

## 2015-09-24 DIAGNOSIS — Z8614 Personal history of Methicillin resistant Staphylococcus aureus infection: Secondary | ICD-10-CM | POA: Diagnosis not present

## 2015-09-24 DIAGNOSIS — J441 Chronic obstructive pulmonary disease with (acute) exacerbation: Secondary | ICD-10-CM | POA: Diagnosis not present

## 2015-09-28 DIAGNOSIS — J471 Bronchiectasis with (acute) exacerbation: Secondary | ICD-10-CM | POA: Diagnosis not present

## 2015-10-01 DIAGNOSIS — J441 Chronic obstructive pulmonary disease with (acute) exacerbation: Secondary | ICD-10-CM | POA: Diagnosis not present

## 2015-10-01 DIAGNOSIS — I5032 Chronic diastolic (congestive) heart failure: Secondary | ICD-10-CM | POA: Diagnosis not present

## 2015-10-01 DIAGNOSIS — J9610 Chronic respiratory failure, unspecified whether with hypoxia or hypercapnia: Secondary | ICD-10-CM | POA: Diagnosis not present

## 2015-10-01 DIAGNOSIS — I110 Hypertensive heart disease with heart failure: Secondary | ICD-10-CM | POA: Diagnosis not present

## 2015-10-04 DIAGNOSIS — I5032 Chronic diastolic (congestive) heart failure: Secondary | ICD-10-CM | POA: Diagnosis not present

## 2015-10-04 DIAGNOSIS — I110 Hypertensive heart disease with heart failure: Secondary | ICD-10-CM | POA: Diagnosis not present

## 2015-10-04 DIAGNOSIS — J449 Chronic obstructive pulmonary disease, unspecified: Secondary | ICD-10-CM | POA: Diagnosis not present

## 2015-10-04 DIAGNOSIS — J189 Pneumonia, unspecified organism: Secondary | ICD-10-CM | POA: Diagnosis not present

## 2015-10-04 DIAGNOSIS — J9610 Chronic respiratory failure, unspecified whether with hypoxia or hypercapnia: Secondary | ICD-10-CM | POA: Diagnosis not present

## 2015-10-04 DIAGNOSIS — J441 Chronic obstructive pulmonary disease with (acute) exacerbation: Secondary | ICD-10-CM | POA: Diagnosis not present

## 2015-10-04 DIAGNOSIS — M6281 Muscle weakness (generalized): Secondary | ICD-10-CM | POA: Diagnosis not present

## 2015-10-06 DIAGNOSIS — J9610 Chronic respiratory failure, unspecified whether with hypoxia or hypercapnia: Secondary | ICD-10-CM | POA: Diagnosis not present

## 2015-10-06 DIAGNOSIS — I110 Hypertensive heart disease with heart failure: Secondary | ICD-10-CM | POA: Diagnosis not present

## 2015-10-06 DIAGNOSIS — I5032 Chronic diastolic (congestive) heart failure: Secondary | ICD-10-CM | POA: Diagnosis not present

## 2015-10-06 DIAGNOSIS — J441 Chronic obstructive pulmonary disease with (acute) exacerbation: Secondary | ICD-10-CM | POA: Diagnosis not present

## 2015-10-08 DIAGNOSIS — J441 Chronic obstructive pulmonary disease with (acute) exacerbation: Secondary | ICD-10-CM | POA: Diagnosis not present

## 2015-10-08 DIAGNOSIS — I110 Hypertensive heart disease with heart failure: Secondary | ICD-10-CM | POA: Diagnosis not present

## 2015-10-08 DIAGNOSIS — J9610 Chronic respiratory failure, unspecified whether with hypoxia or hypercapnia: Secondary | ICD-10-CM | POA: Diagnosis not present

## 2015-10-08 DIAGNOSIS — I5032 Chronic diastolic (congestive) heart failure: Secondary | ICD-10-CM | POA: Diagnosis not present

## 2015-10-11 DIAGNOSIS — J9610 Chronic respiratory failure, unspecified whether with hypoxia or hypercapnia: Secondary | ICD-10-CM | POA: Diagnosis not present

## 2015-10-11 DIAGNOSIS — I110 Hypertensive heart disease with heart failure: Secondary | ICD-10-CM | POA: Diagnosis not present

## 2015-10-11 DIAGNOSIS — I5032 Chronic diastolic (congestive) heart failure: Secondary | ICD-10-CM | POA: Diagnosis not present

## 2015-10-11 DIAGNOSIS — J441 Chronic obstructive pulmonary disease with (acute) exacerbation: Secondary | ICD-10-CM | POA: Diagnosis not present

## 2015-10-13 DIAGNOSIS — J9610 Chronic respiratory failure, unspecified whether with hypoxia or hypercapnia: Secondary | ICD-10-CM | POA: Diagnosis not present

## 2015-10-13 DIAGNOSIS — I5032 Chronic diastolic (congestive) heart failure: Secondary | ICD-10-CM | POA: Diagnosis not present

## 2015-10-13 DIAGNOSIS — I110 Hypertensive heart disease with heart failure: Secondary | ICD-10-CM | POA: Diagnosis not present

## 2015-10-13 DIAGNOSIS — J441 Chronic obstructive pulmonary disease with (acute) exacerbation: Secondary | ICD-10-CM | POA: Diagnosis not present

## 2015-10-15 DIAGNOSIS — J9610 Chronic respiratory failure, unspecified whether with hypoxia or hypercapnia: Secondary | ICD-10-CM | POA: Diagnosis not present

## 2015-10-15 DIAGNOSIS — J441 Chronic obstructive pulmonary disease with (acute) exacerbation: Secondary | ICD-10-CM | POA: Diagnosis not present

## 2015-10-15 DIAGNOSIS — I5032 Chronic diastolic (congestive) heart failure: Secondary | ICD-10-CM | POA: Diagnosis not present

## 2015-10-15 DIAGNOSIS — I110 Hypertensive heart disease with heart failure: Secondary | ICD-10-CM | POA: Diagnosis not present

## 2015-10-18 DIAGNOSIS — I5032 Chronic diastolic (congestive) heart failure: Secondary | ICD-10-CM | POA: Diagnosis not present

## 2015-10-18 DIAGNOSIS — I110 Hypertensive heart disease with heart failure: Secondary | ICD-10-CM | POA: Diagnosis not present

## 2015-10-18 DIAGNOSIS — J9610 Chronic respiratory failure, unspecified whether with hypoxia or hypercapnia: Secondary | ICD-10-CM | POA: Diagnosis not present

## 2015-10-18 DIAGNOSIS — J441 Chronic obstructive pulmonary disease with (acute) exacerbation: Secondary | ICD-10-CM | POA: Diagnosis not present

## 2015-10-20 DIAGNOSIS — I110 Hypertensive heart disease with heart failure: Secondary | ICD-10-CM | POA: Diagnosis not present

## 2015-10-20 DIAGNOSIS — I5032 Chronic diastolic (congestive) heart failure: Secondary | ICD-10-CM | POA: Diagnosis not present

## 2015-10-20 DIAGNOSIS — J9610 Chronic respiratory failure, unspecified whether with hypoxia or hypercapnia: Secondary | ICD-10-CM | POA: Diagnosis not present

## 2015-10-20 DIAGNOSIS — J441 Chronic obstructive pulmonary disease with (acute) exacerbation: Secondary | ICD-10-CM | POA: Diagnosis not present

## 2015-10-22 DIAGNOSIS — J9610 Chronic respiratory failure, unspecified whether with hypoxia or hypercapnia: Secondary | ICD-10-CM | POA: Diagnosis not present

## 2015-10-22 DIAGNOSIS — I110 Hypertensive heart disease with heart failure: Secondary | ICD-10-CM | POA: Diagnosis not present

## 2015-10-22 DIAGNOSIS — J441 Chronic obstructive pulmonary disease with (acute) exacerbation: Secondary | ICD-10-CM | POA: Diagnosis not present

## 2015-10-22 DIAGNOSIS — I5032 Chronic diastolic (congestive) heart failure: Secondary | ICD-10-CM | POA: Diagnosis not present

## 2015-10-27 DIAGNOSIS — J441 Chronic obstructive pulmonary disease with (acute) exacerbation: Secondary | ICD-10-CM | POA: Diagnosis not present

## 2015-10-27 DIAGNOSIS — J9610 Chronic respiratory failure, unspecified whether with hypoxia or hypercapnia: Secondary | ICD-10-CM | POA: Diagnosis not present

## 2015-10-27 DIAGNOSIS — I110 Hypertensive heart disease with heart failure: Secondary | ICD-10-CM | POA: Diagnosis not present

## 2015-10-27 DIAGNOSIS — I5032 Chronic diastolic (congestive) heart failure: Secondary | ICD-10-CM | POA: Diagnosis not present

## 2015-10-28 DIAGNOSIS — I110 Hypertensive heart disease with heart failure: Secondary | ICD-10-CM | POA: Diagnosis not present

## 2015-10-28 DIAGNOSIS — J9610 Chronic respiratory failure, unspecified whether with hypoxia or hypercapnia: Secondary | ICD-10-CM | POA: Diagnosis not present

## 2015-10-28 DIAGNOSIS — I5032 Chronic diastolic (congestive) heart failure: Secondary | ICD-10-CM | POA: Diagnosis not present

## 2015-10-28 DIAGNOSIS — J441 Chronic obstructive pulmonary disease with (acute) exacerbation: Secondary | ICD-10-CM | POA: Diagnosis not present

## 2015-10-29 DIAGNOSIS — I5032 Chronic diastolic (congestive) heart failure: Secondary | ICD-10-CM | POA: Diagnosis not present

## 2015-10-29 DIAGNOSIS — J471 Bronchiectasis with (acute) exacerbation: Secondary | ICD-10-CM | POA: Diagnosis not present

## 2015-10-29 DIAGNOSIS — I110 Hypertensive heart disease with heart failure: Secondary | ICD-10-CM | POA: Diagnosis not present

## 2015-10-29 DIAGNOSIS — J441 Chronic obstructive pulmonary disease with (acute) exacerbation: Secondary | ICD-10-CM | POA: Diagnosis not present

## 2015-10-29 DIAGNOSIS — J9610 Chronic respiratory failure, unspecified whether with hypoxia or hypercapnia: Secondary | ICD-10-CM | POA: Diagnosis not present

## 2015-11-01 DIAGNOSIS — J9610 Chronic respiratory failure, unspecified whether with hypoxia or hypercapnia: Secondary | ICD-10-CM | POA: Diagnosis not present

## 2015-11-01 DIAGNOSIS — I5032 Chronic diastolic (congestive) heart failure: Secondary | ICD-10-CM | POA: Diagnosis not present

## 2015-11-01 DIAGNOSIS — I110 Hypertensive heart disease with heart failure: Secondary | ICD-10-CM | POA: Diagnosis not present

## 2015-11-01 DIAGNOSIS — J441 Chronic obstructive pulmonary disease with (acute) exacerbation: Secondary | ICD-10-CM | POA: Diagnosis not present

## 2015-11-03 DIAGNOSIS — I110 Hypertensive heart disease with heart failure: Secondary | ICD-10-CM | POA: Diagnosis not present

## 2015-11-03 DIAGNOSIS — J9610 Chronic respiratory failure, unspecified whether with hypoxia or hypercapnia: Secondary | ICD-10-CM | POA: Diagnosis not present

## 2015-11-03 DIAGNOSIS — J441 Chronic obstructive pulmonary disease with (acute) exacerbation: Secondary | ICD-10-CM | POA: Diagnosis not present

## 2015-11-03 DIAGNOSIS — I5032 Chronic diastolic (congestive) heart failure: Secondary | ICD-10-CM | POA: Diagnosis not present

## 2015-11-04 DIAGNOSIS — J9611 Chronic respiratory failure with hypoxia: Secondary | ICD-10-CM | POA: Diagnosis not present

## 2015-11-04 DIAGNOSIS — J441 Chronic obstructive pulmonary disease with (acute) exacerbation: Secondary | ICD-10-CM | POA: Diagnosis not present

## 2015-11-04 DIAGNOSIS — J432 Centrilobular emphysema: Secondary | ICD-10-CM | POA: Diagnosis not present

## 2015-11-05 DIAGNOSIS — I110 Hypertensive heart disease with heart failure: Secondary | ICD-10-CM | POA: Diagnosis not present

## 2015-11-05 DIAGNOSIS — J441 Chronic obstructive pulmonary disease with (acute) exacerbation: Secondary | ICD-10-CM | POA: Diagnosis not present

## 2015-11-05 DIAGNOSIS — J9610 Chronic respiratory failure, unspecified whether with hypoxia or hypercapnia: Secondary | ICD-10-CM | POA: Diagnosis not present

## 2015-11-05 DIAGNOSIS — I5032 Chronic diastolic (congestive) heart failure: Secondary | ICD-10-CM | POA: Diagnosis not present

## 2015-11-08 DIAGNOSIS — I5032 Chronic diastolic (congestive) heart failure: Secondary | ICD-10-CM | POA: Diagnosis not present

## 2015-11-08 DIAGNOSIS — J9610 Chronic respiratory failure, unspecified whether with hypoxia or hypercapnia: Secondary | ICD-10-CM | POA: Diagnosis not present

## 2015-11-08 DIAGNOSIS — I110 Hypertensive heart disease with heart failure: Secondary | ICD-10-CM | POA: Diagnosis not present

## 2015-11-08 DIAGNOSIS — J441 Chronic obstructive pulmonary disease with (acute) exacerbation: Secondary | ICD-10-CM | POA: Diagnosis not present

## 2015-11-15 DIAGNOSIS — G9009 Other idiopathic peripheral autonomic neuropathy: Secondary | ICD-10-CM | POA: Diagnosis not present

## 2015-11-15 DIAGNOSIS — I110 Hypertensive heart disease with heart failure: Secondary | ICD-10-CM | POA: Diagnosis not present

## 2015-11-15 DIAGNOSIS — I5032 Chronic diastolic (congestive) heart failure: Secondary | ICD-10-CM | POA: Diagnosis not present

## 2015-11-15 DIAGNOSIS — J9610 Chronic respiratory failure, unspecified whether with hypoxia or hypercapnia: Secondary | ICD-10-CM | POA: Diagnosis not present

## 2015-11-15 DIAGNOSIS — J441 Chronic obstructive pulmonary disease with (acute) exacerbation: Secondary | ICD-10-CM | POA: Diagnosis not present

## 2015-11-15 DIAGNOSIS — Z7951 Long term (current) use of inhaled steroids: Secondary | ICD-10-CM | POA: Diagnosis not present

## 2015-11-15 DIAGNOSIS — Z9181 History of falling: Secondary | ICD-10-CM | POA: Diagnosis not present

## 2015-11-15 DIAGNOSIS — Z8614 Personal history of Methicillin resistant Staphylococcus aureus infection: Secondary | ICD-10-CM | POA: Diagnosis not present

## 2015-11-15 DIAGNOSIS — J45909 Unspecified asthma, uncomplicated: Secondary | ICD-10-CM | POA: Diagnosis not present

## 2015-11-15 DIAGNOSIS — Z9981 Dependence on supplemental oxygen: Secondary | ICD-10-CM | POA: Diagnosis not present

## 2015-11-17 DIAGNOSIS — I110 Hypertensive heart disease with heart failure: Secondary | ICD-10-CM | POA: Diagnosis not present

## 2015-11-17 DIAGNOSIS — Z7951 Long term (current) use of inhaled steroids: Secondary | ICD-10-CM | POA: Diagnosis not present

## 2015-11-17 DIAGNOSIS — J45909 Unspecified asthma, uncomplicated: Secondary | ICD-10-CM | POA: Diagnosis not present

## 2015-11-17 DIAGNOSIS — Z9981 Dependence on supplemental oxygen: Secondary | ICD-10-CM | POA: Diagnosis not present

## 2015-11-17 DIAGNOSIS — J9610 Chronic respiratory failure, unspecified whether with hypoxia or hypercapnia: Secondary | ICD-10-CM | POA: Diagnosis not present

## 2015-11-17 DIAGNOSIS — I5032 Chronic diastolic (congestive) heart failure: Secondary | ICD-10-CM | POA: Diagnosis not present

## 2015-11-17 DIAGNOSIS — G9009 Other idiopathic peripheral autonomic neuropathy: Secondary | ICD-10-CM | POA: Diagnosis not present

## 2015-11-17 DIAGNOSIS — Z8614 Personal history of Methicillin resistant Staphylococcus aureus infection: Secondary | ICD-10-CM | POA: Diagnosis not present

## 2015-11-17 DIAGNOSIS — Z9181 History of falling: Secondary | ICD-10-CM | POA: Diagnosis not present

## 2015-11-17 DIAGNOSIS — J441 Chronic obstructive pulmonary disease with (acute) exacerbation: Secondary | ICD-10-CM | POA: Diagnosis not present

## 2015-11-19 DIAGNOSIS — Z7951 Long term (current) use of inhaled steroids: Secondary | ICD-10-CM | POA: Diagnosis not present

## 2015-11-19 DIAGNOSIS — I5032 Chronic diastolic (congestive) heart failure: Secondary | ICD-10-CM | POA: Diagnosis not present

## 2015-11-19 DIAGNOSIS — J441 Chronic obstructive pulmonary disease with (acute) exacerbation: Secondary | ICD-10-CM | POA: Diagnosis not present

## 2015-11-19 DIAGNOSIS — Z9181 History of falling: Secondary | ICD-10-CM | POA: Diagnosis not present

## 2015-11-19 DIAGNOSIS — I110 Hypertensive heart disease with heart failure: Secondary | ICD-10-CM | POA: Diagnosis not present

## 2015-11-19 DIAGNOSIS — Z8614 Personal history of Methicillin resistant Staphylococcus aureus infection: Secondary | ICD-10-CM | POA: Diagnosis not present

## 2015-11-19 DIAGNOSIS — J45909 Unspecified asthma, uncomplicated: Secondary | ICD-10-CM | POA: Diagnosis not present

## 2015-11-19 DIAGNOSIS — G9009 Other idiopathic peripheral autonomic neuropathy: Secondary | ICD-10-CM | POA: Diagnosis not present

## 2015-11-19 DIAGNOSIS — J9610 Chronic respiratory failure, unspecified whether with hypoxia or hypercapnia: Secondary | ICD-10-CM | POA: Diagnosis not present

## 2015-11-19 DIAGNOSIS — Z9981 Dependence on supplemental oxygen: Secondary | ICD-10-CM | POA: Diagnosis not present

## 2015-11-22 DIAGNOSIS — Z8614 Personal history of Methicillin resistant Staphylococcus aureus infection: Secondary | ICD-10-CM | POA: Diagnosis not present

## 2015-11-22 DIAGNOSIS — J45909 Unspecified asthma, uncomplicated: Secondary | ICD-10-CM | POA: Diagnosis not present

## 2015-11-22 DIAGNOSIS — Z9981 Dependence on supplemental oxygen: Secondary | ICD-10-CM | POA: Diagnosis not present

## 2015-11-22 DIAGNOSIS — I110 Hypertensive heart disease with heart failure: Secondary | ICD-10-CM | POA: Diagnosis not present

## 2015-11-22 DIAGNOSIS — I5032 Chronic diastolic (congestive) heart failure: Secondary | ICD-10-CM | POA: Diagnosis not present

## 2015-11-22 DIAGNOSIS — G9009 Other idiopathic peripheral autonomic neuropathy: Secondary | ICD-10-CM | POA: Diagnosis not present

## 2015-11-22 DIAGNOSIS — J441 Chronic obstructive pulmonary disease with (acute) exacerbation: Secondary | ICD-10-CM | POA: Diagnosis not present

## 2015-11-22 DIAGNOSIS — J9610 Chronic respiratory failure, unspecified whether with hypoxia or hypercapnia: Secondary | ICD-10-CM | POA: Diagnosis not present

## 2015-11-22 DIAGNOSIS — Z9181 History of falling: Secondary | ICD-10-CM | POA: Diagnosis not present

## 2015-11-22 DIAGNOSIS — Z7951 Long term (current) use of inhaled steroids: Secondary | ICD-10-CM | POA: Diagnosis not present

## 2015-11-24 DIAGNOSIS — I5032 Chronic diastolic (congestive) heart failure: Secondary | ICD-10-CM | POA: Diagnosis not present

## 2015-11-24 DIAGNOSIS — J45909 Unspecified asthma, uncomplicated: Secondary | ICD-10-CM | POA: Diagnosis not present

## 2015-11-24 DIAGNOSIS — J441 Chronic obstructive pulmonary disease with (acute) exacerbation: Secondary | ICD-10-CM | POA: Diagnosis not present

## 2015-11-24 DIAGNOSIS — I110 Hypertensive heart disease with heart failure: Secondary | ICD-10-CM | POA: Diagnosis not present

## 2015-11-24 DIAGNOSIS — Z7951 Long term (current) use of inhaled steroids: Secondary | ICD-10-CM | POA: Diagnosis not present

## 2015-11-24 DIAGNOSIS — J9610 Chronic respiratory failure, unspecified whether with hypoxia or hypercapnia: Secondary | ICD-10-CM | POA: Diagnosis not present

## 2015-11-24 DIAGNOSIS — Z9981 Dependence on supplemental oxygen: Secondary | ICD-10-CM | POA: Diagnosis not present

## 2015-11-24 DIAGNOSIS — Z9181 History of falling: Secondary | ICD-10-CM | POA: Diagnosis not present

## 2015-11-24 DIAGNOSIS — G9009 Other idiopathic peripheral autonomic neuropathy: Secondary | ICD-10-CM | POA: Diagnosis not present

## 2015-11-24 DIAGNOSIS — Z8614 Personal history of Methicillin resistant Staphylococcus aureus infection: Secondary | ICD-10-CM | POA: Diagnosis not present

## 2015-11-26 DIAGNOSIS — J471 Bronchiectasis with (acute) exacerbation: Secondary | ICD-10-CM | POA: Diagnosis not present

## 2015-11-29 DIAGNOSIS — J45909 Unspecified asthma, uncomplicated: Secondary | ICD-10-CM | POA: Diagnosis not present

## 2015-11-29 DIAGNOSIS — G9009 Other idiopathic peripheral autonomic neuropathy: Secondary | ICD-10-CM | POA: Diagnosis not present

## 2015-11-29 DIAGNOSIS — Z9181 History of falling: Secondary | ICD-10-CM | POA: Diagnosis not present

## 2015-11-29 DIAGNOSIS — Z8614 Personal history of Methicillin resistant Staphylococcus aureus infection: Secondary | ICD-10-CM | POA: Diagnosis not present

## 2015-11-29 DIAGNOSIS — I110 Hypertensive heart disease with heart failure: Secondary | ICD-10-CM | POA: Diagnosis not present

## 2015-11-29 DIAGNOSIS — J441 Chronic obstructive pulmonary disease with (acute) exacerbation: Secondary | ICD-10-CM | POA: Diagnosis not present

## 2015-11-29 DIAGNOSIS — Z7951 Long term (current) use of inhaled steroids: Secondary | ICD-10-CM | POA: Diagnosis not present

## 2015-11-29 DIAGNOSIS — Z9981 Dependence on supplemental oxygen: Secondary | ICD-10-CM | POA: Diagnosis not present

## 2015-11-29 DIAGNOSIS — J9610 Chronic respiratory failure, unspecified whether with hypoxia or hypercapnia: Secondary | ICD-10-CM | POA: Diagnosis not present

## 2015-11-29 DIAGNOSIS — I5032 Chronic diastolic (congestive) heart failure: Secondary | ICD-10-CM | POA: Diagnosis not present

## 2015-11-30 ENCOUNTER — Emergency Department: Payer: Medicare Other

## 2015-11-30 ENCOUNTER — Encounter: Payer: Self-pay | Admitting: Emergency Medicine

## 2015-11-30 ENCOUNTER — Observation Stay (HOSPITAL_BASED_OUTPATIENT_CLINIC_OR_DEPARTMENT_OTHER)
Admit: 2015-11-30 | Discharge: 2015-11-30 | Disposition: A | Discharge status: Home / Self Care | Payer: Medicare Other | Attending: Student | Admitting: Student

## 2015-11-30 ENCOUNTER — Inpatient Hospital Stay: Payer: Medicare Other

## 2015-11-30 ENCOUNTER — Inpatient Hospital Stay
Admission: EM | Admit: 2015-11-30 | Discharge: 2015-12-06 | DRG: 208 | Disposition: A | Discharge status: Home Health | Payer: Medicare Other | Attending: Internal Medicine | Admitting: Internal Medicine

## 2015-11-30 DIAGNOSIS — Z720 Tobacco use: Secondary | ICD-10-CM | POA: Diagnosis not present

## 2015-11-30 DIAGNOSIS — G9340 Encephalopathy, unspecified: Secondary | ICD-10-CM | POA: Diagnosis not present

## 2015-11-30 DIAGNOSIS — R739 Hyperglycemia, unspecified: Secondary | ICD-10-CM | POA: Diagnosis not present

## 2015-11-30 DIAGNOSIS — J9692 Respiratory failure, unspecified with hypercapnia: Secondary | ICD-10-CM | POA: Diagnosis present

## 2015-11-30 DIAGNOSIS — J189 Pneumonia, unspecified organism: Secondary | ICD-10-CM | POA: Diagnosis not present

## 2015-11-30 DIAGNOSIS — I4510 Unspecified right bundle-branch block: Secondary | ICD-10-CM | POA: Diagnosis present

## 2015-11-30 DIAGNOSIS — Z885 Allergy status to narcotic agent status: Secondary | ICD-10-CM

## 2015-11-30 DIAGNOSIS — Z978 Presence of other specified devices: Secondary | ICD-10-CM | POA: Insufficient documentation

## 2015-11-30 DIAGNOSIS — J441 Chronic obstructive pulmonary disease with (acute) exacerbation: Principal | ICD-10-CM

## 2015-11-30 DIAGNOSIS — R5383 Other fatigue: Secondary | ICD-10-CM | POA: Insufficient documentation

## 2015-11-30 DIAGNOSIS — F329 Major depressive disorder, single episode, unspecified: Secondary | ICD-10-CM | POA: Diagnosis present

## 2015-11-30 DIAGNOSIS — J9621 Acute and chronic respiratory failure with hypoxia: Secondary | ICD-10-CM

## 2015-11-30 DIAGNOSIS — Z886 Allergy status to analgesic agent status: Secondary | ICD-10-CM | POA: Diagnosis not present

## 2015-11-30 DIAGNOSIS — Z01818 Encounter for other preprocedural examination: Secondary | ICD-10-CM

## 2015-11-30 DIAGNOSIS — I313 Pericardial effusion (noninflammatory): Secondary | ICD-10-CM | POA: Diagnosis present

## 2015-11-30 DIAGNOSIS — J432 Centrilobular emphysema: Secondary | ICD-10-CM | POA: Diagnosis not present

## 2015-11-30 DIAGNOSIS — Z8249 Family history of ischemic heart disease and other diseases of the circulatory system: Secondary | ICD-10-CM | POA: Diagnosis not present

## 2015-11-30 DIAGNOSIS — I272 Other secondary pulmonary hypertension: Secondary | ICD-10-CM | POA: Diagnosis present

## 2015-11-30 DIAGNOSIS — Z4682 Encounter for fitting and adjustment of non-vascular catheter: Secondary | ICD-10-CM | POA: Diagnosis not present

## 2015-11-30 DIAGNOSIS — R0789 Other chest pain: Secondary | ICD-10-CM | POA: Diagnosis not present

## 2015-11-30 DIAGNOSIS — T380X5A Adverse effect of glucocorticoids and synthetic analogues, initial encounter: Secondary | ICD-10-CM | POA: Diagnosis not present

## 2015-11-30 DIAGNOSIS — Z79899 Other long term (current) drug therapy: Secondary | ICD-10-CM | POA: Diagnosis not present

## 2015-11-30 DIAGNOSIS — Z9981 Dependence on supplemental oxygen: Secondary | ICD-10-CM

## 2015-11-30 DIAGNOSIS — J9600 Acute respiratory failure, unspecified whether with hypoxia or hypercapnia: Secondary | ICD-10-CM | POA: Diagnosis not present

## 2015-11-30 DIAGNOSIS — K219 Gastro-esophageal reflux disease without esophagitis: Secondary | ICD-10-CM | POA: Diagnosis present

## 2015-11-30 DIAGNOSIS — I248 Other forms of acute ischemic heart disease: Secondary | ICD-10-CM | POA: Diagnosis not present

## 2015-11-30 DIAGNOSIS — R1310 Dysphagia, unspecified: Secondary | ICD-10-CM | POA: Diagnosis present

## 2015-11-30 DIAGNOSIS — J449 Chronic obstructive pulmonary disease, unspecified: Secondary | ICD-10-CM | POA: Diagnosis not present

## 2015-11-30 DIAGNOSIS — I5032 Chronic diastolic (congestive) heart failure: Secondary | ICD-10-CM

## 2015-11-30 DIAGNOSIS — J9602 Acute respiratory failure with hypercapnia: Secondary | ICD-10-CM | POA: Diagnosis not present

## 2015-11-30 DIAGNOSIS — J9601 Acute respiratory failure with hypoxia: Secondary | ICD-10-CM | POA: Diagnosis not present

## 2015-11-30 DIAGNOSIS — J9622 Acute and chronic respiratory failure with hypercapnia: Secondary | ICD-10-CM | POA: Diagnosis not present

## 2015-11-30 DIAGNOSIS — R000 Tachycardia, unspecified: Secondary | ICD-10-CM | POA: Diagnosis not present

## 2015-11-30 DIAGNOSIS — Z789 Other specified health status: Secondary | ICD-10-CM | POA: Diagnosis not present

## 2015-11-30 DIAGNOSIS — R7989 Other specified abnormal findings of blood chemistry: Secondary | ICD-10-CM | POA: Diagnosis not present

## 2015-11-30 DIAGNOSIS — R079 Chest pain, unspecified: Secondary | ICD-10-CM

## 2015-11-30 DIAGNOSIS — J9690 Respiratory failure, unspecified, unspecified whether with hypoxia or hypercapnia: Secondary | ICD-10-CM

## 2015-11-30 DIAGNOSIS — J45909 Unspecified asthma, uncomplicated: Secondary | ICD-10-CM | POA: Diagnosis present

## 2015-11-30 DIAGNOSIS — F17210 Nicotine dependence, cigarettes, uncomplicated: Secondary | ICD-10-CM | POA: Diagnosis present

## 2015-11-30 DIAGNOSIS — G629 Polyneuropathy, unspecified: Secondary | ICD-10-CM | POA: Diagnosis present

## 2015-11-30 DIAGNOSIS — I959 Hypotension, unspecified: Secondary | ICD-10-CM | POA: Diagnosis present

## 2015-11-30 DIAGNOSIS — R0602 Shortness of breath: Secondary | ICD-10-CM | POA: Diagnosis not present

## 2015-11-30 LAB — BLOOD GAS, ARTERIAL
ACID-BASE EXCESS: 7.7 mmol/L — AB (ref 0.0–3.0)
ALLENS TEST (PASS/FAIL): POSITIVE — AB
ALLENS TEST (PASS/FAIL): POSITIVE — AB
Acid-Base Excess: 9.1 mmol/L — ABNORMAL HIGH (ref 0.0–3.0)
Acid-Base Excess: 9 mmol/L — ABNORMAL HIGH (ref 0.0–3.0)
Acid-Base Excess: 12 mmol/L — ABNORMAL HIGH (ref 0.0–3.0)
Allens test (pass/fail): POSITIVE — AB
Allens test (pass/fail): POSITIVE — AB
BICARBONATE: 43.2 meq/L — AB (ref 21.0–28.0)
BICARBONATE: 43.2 meq/L — AB (ref 21.0–28.0)
Bicarbonate: 38.6 mEq/L — ABNORMAL HIGH (ref 21.0–28.0)
Bicarbonate: 41.7 mEq/L — ABNORMAL HIGH (ref 21.0–28.0)
Delivery systems: POSITIVE
Expiratory PAP: 5
FIO2: 0.28
FIO2: 0.55
FIO2: 0.75
FIO2: 0.6
Inspiratory PAP: 12
MECHANICAL RATE: 20
MECHVT: 450 mL
Mechanical Rate: 18
O2 SAT: 97 %
O2 SAT: 99.7 %
O2 Saturation: 74.5 %
O2 Saturation: 98.5 %
PATIENT TEMPERATURE: 37
PATIENT TEMPERATURE: 37
PATIENT TEMPERATURE: 37
PATIENT TEMPERATURE: 37
PCO2 ART: 127 mmHg — AB (ref 32.0–48.0)
PCO2 ART: 90 mmHg — AB (ref 32.0–48.0)
PCO2 ART: 81 mmHg — AB (ref 32.0–48.0)
PEEP/CPAP: 8 cmH2O
PEEP: 8 cmH2O
PH ART: 7.24 — AB (ref 7.350–7.450)
PO2 ART: 113 mmHg — AB (ref 83.0–108.0)
PO2 ART: 123 mmHg — AB (ref 83.0–108.0)
RATE: 18 resp/min
RATE: 20 resp/min
VT: 450 mL
pCO2 arterial: 127 mmHg (ref 32.0–48.0)
pH, Arterial: 7.14 — CL (ref 7.350–7.450)
pH, Arterial: 7.14 — CL (ref 7.350–7.450)
pH, Arterial: 7.32 — ABNORMAL LOW (ref 7.350–7.450)
pO2, Arterial: 52 mmHg — ABNORMAL LOW (ref 83.0–108.0)
pO2, Arterial: 232 mmHg — ABNORMAL HIGH (ref 83.0–108.0)

## 2015-11-30 LAB — CBC WITH DIFFERENTIAL/PLATELET
BASOS PCT: 1 %
Basophils Absolute: 0 10*3/uL (ref 0–0.1)
EOS ABS: 0.2 10*3/uL (ref 0–0.7)
Eosinophils Relative: 3 %
HEMATOCRIT: 39.9 % — AB (ref 40.0–52.0)
HEMOGLOBIN: 13.2 g/dL (ref 13.0–18.0)
LYMPHS ABS: 1.7 10*3/uL (ref 1.0–3.6)
Lymphocytes Relative: 23 %
MCH: 32 pg (ref 26.0–34.0)
MCHC: 33 g/dL (ref 32.0–36.0)
MCV: 96.9 fL (ref 80.0–100.0)
Monocytes Absolute: 0.5 10*3/uL (ref 0.2–1.0)
Monocytes Relative: 7 %
NEUTROS ABS: 4.8 10*3/uL (ref 1.4–6.5)
NEUTROS PCT: 66 %
Platelets: 173 10*3/uL (ref 150–440)
RBC: 4.12 MIL/uL — AB (ref 4.40–5.90)
RDW: 14.3 % (ref 11.5–14.5)
WBC: 7.3 10*3/uL (ref 3.8–10.6)

## 2015-11-30 LAB — BASIC METABOLIC PANEL
ANION GAP: 6 (ref 5–15)
BUN: 18 mg/dL (ref 6–20)
CALCIUM: 8.9 mg/dL (ref 8.9–10.3)
CHLORIDE: 103 mmol/L (ref 101–111)
CO2: 32 mmol/L (ref 22–32)
CREATININE: 1 mg/dL (ref 0.61–1.24)
GFR calc Af Amer: >60 mL/min (ref >60)
GFR calc non Af Amer: >60 mL/min (ref >60)
Glucose, Bld: 160 mg/dL — ABNORMAL HIGH (ref 65–99)
Potassium: 3.9 mmol/L (ref 3.5–5.1)
SODIUM: 141 mmol/L (ref 135–145)

## 2015-11-30 LAB — TROPONIN I: Troponin I: <0.03 ng/mL (ref <0.031)

## 2015-11-30 LAB — GLUCOSE, CAPILLARY
Glucose-Capillary: 113 mg/dL — ABNORMAL HIGH (ref 65–99)
Glucose-Capillary: 102 mg/dL — ABNORMAL HIGH (ref 65–99)

## 2015-11-30 LAB — ECHOCARDIOGRAM COMPLETE
Height: 70 in
Weight: 2974.4 oz

## 2015-11-30 LAB — MRSA PCR SCREENING: MRSA by PCR: NEGATIVE

## 2015-11-30 LAB — TSH: TSH: 1.79 u[IU]/mL (ref 0.350–4.500)

## 2015-11-30 LAB — BRAIN NATRIURETIC PEPTIDE: B Natriuretic Peptide: 24 pg/mL (ref 0.0–100.0)

## 2015-11-30 LAB — TRIGLYCERIDES: Triglycerides: 61 mg/dL (ref <150)

## 2015-11-30 LAB — HEMOGLOBIN A1C: Hgb A1c MFr Bld: 5.3 % (ref 4.0–6.0)

## 2015-11-30 LAB — THEOPHYLLINE LEVEL: THEOPHYLLINE LVL: 4.7 — AB (ref 8.0–20.0)

## 2015-11-30 MED ORDER — INSULIN ASPART 100 UNIT/ML ~~LOC~~ SOLN
0.0000 [IU] | SUBCUTANEOUS | Status: DC
  Administered 2015-12-01 – 2015-12-02 (×5): 2 [IU] via SUBCUTANEOUS
  Filled 2015-11-30 (×5): qty 2

## 2015-11-30 MED ORDER — CHLORHEXIDINE GLUCONATE 0.12% ORAL RINSE (MEDLINE KIT)
15.0000 mL | Freq: Two times a day (BID) | OROMUCOSAL | Status: DC
  Filled 2015-11-30 (×2): qty 15

## 2015-11-30 MED ORDER — ONDANSETRON HCL 4 MG PO TABS: 4.0000 mg | ORAL_TABLET | Freq: Four times a day (QID) | ORAL | Status: DC | PRN

## 2015-11-30 MED ORDER — ONDANSETRON HCL 4 MG/2ML IJ SOLN
4.0000 mg | Freq: Once | INTRAMUSCULAR | Status: AC
  Administered 2015-11-30: 4 mg via INTRAVENOUS
  Filled 2015-11-30: qty 2

## 2015-11-30 MED ORDER — DOCUSATE SODIUM 100 MG PO CAPS
100.0000 mg | ORAL_CAPSULE | Freq: Two times a day (BID) | ORAL | Status: DC
  Administered 2015-11-30 (×2): 100 mg via ORAL
  Filled 2015-11-30 (×2): qty 1

## 2015-11-30 MED ORDER — NICOTINE 21 MG/24HR TD PT24
21.0000 mg | MEDICATED_PATCH | Freq: Every day | TRANSDERMAL | Status: DC
  Administered 2015-11-30 – 2015-12-02 (×3): 21 mg via TRANSDERMAL
  Filled 2015-11-30 (×3): qty 1

## 2015-11-30 MED ORDER — BUDESONIDE 0.25 MG/2ML IN SUSP
0.2500 mg | Freq: Four times a day (QID) | RESPIRATORY_TRACT | Status: DC
  Administered 2015-11-30 – 2015-12-01 (×3): 0.25 mg via RESPIRATORY_TRACT
  Filled 2015-11-30 (×3): qty 2

## 2015-11-30 MED ORDER — THEOPHYLLINE ER 400 MG PO CP24
400.0000 mg | ORAL_CAPSULE | Freq: Every day | ORAL | Status: DC
  Administered 2015-11-30: 400 mg via ORAL
  Filled 2015-11-30: qty 1

## 2015-11-30 MED ORDER — ANTISEPTIC ORAL RINSE SOLUTION (CORINZ)
7.0000 mL | Freq: Four times a day (QID) | OROMUCOSAL | Status: DC
  Filled 2015-11-30 (×2): qty 7

## 2015-11-30 MED ORDER — FENTANYL CITRATE (PF) 100 MCG/2ML IJ SOLN
INTRAMUSCULAR | Status: AC
  Administered 2015-11-30: 100 ug
  Filled 2015-11-30: qty 2

## 2015-11-30 MED ORDER — ALBUTEROL SULFATE (2.5 MG/3ML) 0.083% IN NEBU: 2.5000 mg | INHALATION_SOLUTION | RESPIRATORY_TRACT | Status: DC | PRN

## 2015-11-30 MED ORDER — ANTISEPTIC ORAL RINSE SOLUTION (CORINZ)
7.0000 mL | Freq: Four times a day (QID) | OROMUCOSAL | Status: DC
  Administered 2015-12-01 – 2015-12-02 (×6): 7 mL via OROMUCOSAL
  Filled 2015-11-30 (×10): qty 7

## 2015-11-30 MED ORDER — DILTIAZEM HCL 30 MG PO TABS
30.0000 mg | ORAL_TABLET | Freq: Four times a day (QID) | ORAL | Status: DC
  Administered 2015-11-30: 30 mg via ORAL
  Filled 2015-11-30 (×2): qty 1

## 2015-11-30 MED ORDER — IPRATROPIUM-ALBUTEROL 0.5-2.5 (3) MG/3ML IN SOLN
3.0000 mL | Freq: Four times a day (QID) | RESPIRATORY_TRACT | Status: DC
  Administered 2015-11-30 – 2015-12-06 (×24): 3 mL via RESPIRATORY_TRACT
  Filled 2015-11-30 (×24): qty 3

## 2015-11-30 MED ORDER — ACETAMINOPHEN 650 MG RE SUPP
650.0000 mg | Freq: Four times a day (QID) | RECTAL | Status: DC | PRN
Start: 2015-11-30 — End: 2015-12-06

## 2015-11-30 MED ORDER — LACTATED RINGERS IV SOLN
INTRAVENOUS | Status: DC
  Administered 2015-11-30 – 2015-12-01 (×2): via INTRAVENOUS

## 2015-11-30 MED ORDER — ROCURONIUM BROMIDE 50 MG/5ML IV SOLN
INTRAVENOUS | Status: AC
  Filled 2015-11-30: qty 1

## 2015-11-30 MED ORDER — FENTANYL CITRATE (PF) 100 MCG/2ML IJ SOLN
50.0000 ug | INTRAMUSCULAR | Status: DC | PRN
  Administered 2015-12-01 (×3): 50 ug via INTRAVENOUS
  Filled 2015-11-30 (×2): qty 2

## 2015-11-30 MED ORDER — CLONIDINE HCL 0.1 MG PO TABS: 0.2000 mg | ORAL_TABLET | ORAL | Status: DC

## 2015-11-30 MED ORDER — MIDAZOLAM HCL 2 MG/2ML IJ SOLN
INTRAMUSCULAR | Status: AC
  Administered 2015-11-30: 4 mg
  Filled 2015-11-30: qty 4

## 2015-11-30 MED ORDER — VECURONIUM BROMIDE 10 MG IV SOLR
INTRAVENOUS | Status: AC
  Filled 2015-11-30: qty 50

## 2015-11-30 MED ORDER — GABAPENTIN 300 MG PO CAPS
300.0000 mg | ORAL_CAPSULE | Freq: Three times a day (TID) | ORAL | Status: DC
  Administered 2015-11-30 (×2): 300 mg via ORAL
  Filled 2015-11-30 (×2): qty 1

## 2015-11-30 MED ORDER — IPRATROPIUM-ALBUTEROL 0.5-2.5 (3) MG/3ML IN SOLN: 3.0000 mL | Freq: Four times a day (QID) | RESPIRATORY_TRACT | Status: DC | PRN

## 2015-11-30 MED ORDER — DOCUSATE SODIUM 50 MG/5ML PO LIQD: 100.0000 mg | Freq: Two times a day (BID) | ORAL | Status: DC | PRN

## 2015-11-30 MED ORDER — ENOXAPARIN SODIUM 40 MG/0.4ML ~~LOC~~ SOLN
40.0000 mg | SUBCUTANEOUS | Status: DC
  Administered 2015-11-30: 40 mg via SUBCUTANEOUS
  Filled 2015-11-30: qty 0.4

## 2015-11-30 MED ORDER — VITAL HIGH PROTEIN PO LIQD: 1000.0000 mL | ORAL | Status: DC

## 2015-11-30 MED ORDER — FENTANYL CITRATE (PF) 100 MCG/2ML IJ SOLN
50.0000 ug | INTRAMUSCULAR | Status: DC | PRN
  Filled 2015-11-30: qty 2

## 2015-11-30 MED ORDER — INFLUENZA VAC SPLIT QUAD 0.5 ML IM SUSY: 0.5000 mL | PREFILLED_SYRINGE | INTRAMUSCULAR | Status: DC

## 2015-11-30 MED ORDER — SODIUM CHLORIDE 0.9 % IV BOLUS (SEPSIS)
1000.0000 mL | Freq: Once | INTRAVENOUS | Status: AC
  Administered 2015-11-30: 1000 mL via INTRAVENOUS

## 2015-11-30 MED ORDER — CHLORHEXIDINE GLUCONATE 0.12% ORAL RINSE (MEDLINE KIT)
15.0000 mL | Freq: Two times a day (BID) | OROMUCOSAL | Status: DC
  Administered 2015-11-30 – 2015-12-02 (×4): 15 mL via OROMUCOSAL
  Filled 2015-11-30 (×6): qty 15

## 2015-11-30 MED ORDER — DOXAZOSIN MESYLATE 4 MG PO TABS
4.0000 mg | ORAL_TABLET | Freq: Every day | ORAL | Status: DC
  Filled 2015-11-30: qty 1

## 2015-11-30 MED ORDER — METHYLPREDNISOLONE SODIUM SUCC 125 MG IJ SOLR
60.0000 mg | Freq: Two times a day (BID) | INTRAMUSCULAR | Status: DC
  Administered 2015-11-30 – 2015-12-01 (×2): 60 mg via INTRAVENOUS
  Filled 2015-11-30 (×2): qty 2

## 2015-11-30 MED ORDER — FUROSEMIDE 40 MG PO TABS: 40.0000 mg | ORAL_TABLET | Freq: Two times a day (BID) | ORAL | Status: DC

## 2015-11-30 MED ORDER — GABAPENTIN 300 MG PO CAPS
600.0000 mg | ORAL_CAPSULE | Freq: Three times a day (TID) | ORAL | Status: DC
  Administered 2015-11-30: 600 mg via ORAL
  Filled 2015-11-30: qty 2

## 2015-11-30 MED ORDER — ALBUTEROL SULFATE 108 (90 BASE) MCG/ACT IN AEPB: 2.0000 | INHALATION_SPRAY | RESPIRATORY_TRACT | Status: DC | PRN

## 2015-11-30 MED ORDER — AZITHROMYCIN 250 MG PO TABS: 250.0000 mg | ORAL_TABLET | Freq: Every day | ORAL | Status: DC

## 2015-11-30 MED ORDER — MORPHINE SULFATE (PF) 2 MG/ML IV SOLN
2.0000 mg | Freq: Once | INTRAVENOUS | Status: AC
  Administered 2015-11-30: 2 mg via INTRAVENOUS
  Filled 2015-11-30: qty 1

## 2015-11-30 MED ORDER — IOHEXOL 350 MG/ML SOLN
100.0000 mL | Freq: Once | INTRAVENOUS | Status: AC | PRN
  Administered 2015-11-30: 100 mL via INTRAVENOUS

## 2015-11-30 MED ORDER — DILTIAZEM HCL 60 MG PO TABS: 120.0000 mg | ORAL_TABLET | Freq: Every day | ORAL | Status: DC

## 2015-11-30 MED ORDER — POTASSIUM CHLORIDE CRYS ER 10 MEQ PO TBCR
10.0000 meq | EXTENDED_RELEASE_TABLET | Freq: Two times a day (BID) | ORAL | Status: DC
  Administered 2015-11-30: 10 meq via ORAL
  Filled 2015-11-30: qty 1

## 2015-11-30 MED ORDER — FUROSEMIDE 40 MG PO TABS
40.0000 mg | ORAL_TABLET | Freq: Every day | ORAL | Status: DC
  Administered 2015-11-30: 40 mg via ORAL
  Filled 2015-11-30: qty 1

## 2015-11-30 MED ORDER — ALPRAZOLAM 0.25 MG PO TABS
0.5000 mg | ORAL_TABLET | Freq: Three times a day (TID) | ORAL | Status: DC | PRN
  Administered 2015-12-01: 0.5 mg via ORAL
  Filled 2015-11-30: qty 2

## 2015-11-30 MED ORDER — FUROSEMIDE 10 MG/ML IJ SOLN
20.0000 mg | Freq: Two times a day (BID) | INTRAMUSCULAR | Status: DC
  Administered 2015-11-30 – 2015-12-02 (×4): 20 mg via INTRAVENOUS
  Filled 2015-11-30 (×4): qty 2

## 2015-11-30 MED ORDER — GUAIFENESIN ER 600 MG PO TB12
600.0000 mg | ORAL_TABLET | Freq: Two times a day (BID) | ORAL | Status: DC
  Administered 2015-11-30: 600 mg via ORAL
  Filled 2015-11-30: qty 1

## 2015-11-30 MED ORDER — NITROGLYCERIN 0.4 MG SL SUBL: 0.4000 mg | SUBLINGUAL_TABLET | SUBLINGUAL | Status: DC | PRN

## 2015-11-30 MED ORDER — SODIUM CHLORIDE 0.9% FLUSH
3.0000 mL | Freq: Two times a day (BID) | INTRAVENOUS | Status: DC
  Administered 2015-11-30 – 2015-12-05 (×11): 3 mL via INTRAVENOUS

## 2015-11-30 MED ORDER — PROPOFOL 1000 MG/100ML IV EMUL
INTRAVENOUS | Status: AC
  Administered 2015-11-30: 20 ug/kg/min via INTRAVENOUS
  Filled 2015-11-30: qty 100

## 2015-11-30 MED ORDER — ONDANSETRON HCL 4 MG/2ML IJ SOLN: 4.0000 mg | Freq: Four times a day (QID) | INTRAMUSCULAR | Status: DC | PRN

## 2015-11-30 MED ORDER — TIOTROPIUM BROMIDE MONOHYDRATE 18 MCG IN CAPS
18.0000 ug | ORAL_CAPSULE | Freq: Every day | RESPIRATORY_TRACT | Status: DC
  Administered 2015-11-30: 18 ug via RESPIRATORY_TRACT
  Filled 2015-11-30: qty 5

## 2015-11-30 MED ORDER — MOMETASONE FURO-FORMOTEROL FUM 200-5 MCG/ACT IN AERO
2.0000 | INHALATION_SPRAY | Freq: Two times a day (BID) | RESPIRATORY_TRACT | Status: DC
  Administered 2015-11-30: 2 via RESPIRATORY_TRACT
  Filled 2015-11-30: qty 8.8

## 2015-11-30 MED ORDER — PROPOFOL 1000 MG/100ML IV EMUL: 5.0000 ug/kg/min | INTRAVENOUS | Status: DC

## 2015-11-30 MED ORDER — RISAQUAD PO CAPS
1.0000 | ORAL_CAPSULE | Freq: Every day | ORAL | Status: DC
  Administered 2015-11-30: 1 via ORAL
  Filled 2015-11-30: qty 1

## 2015-11-30 MED ORDER — ESCITALOPRAM OXALATE 10 MG PO TABS
20.0000 mg | ORAL_TABLET | Freq: Every day | ORAL | Status: DC
  Administered 2015-11-30: 20 mg via ORAL
  Filled 2015-11-30: qty 2

## 2015-11-30 MED ORDER — ACETAMINOPHEN 325 MG PO TABS: 650.0000 mg | ORAL_TABLET | Freq: Four times a day (QID) | ORAL | Status: DC | PRN

## 2015-11-30 MED ORDER — DILTIAZEM HCL ER COATED BEADS 120 MG PO CP24
120.0000 mg | ORAL_CAPSULE | Freq: Every day | ORAL | Status: DC
  Administered 2015-11-30: 120 mg via ORAL
  Filled 2015-11-30: qty 1

## 2015-11-30 MED ORDER — PROPOFOL 1000 MG/100ML IV EMUL
0.0000 ug/kg/min | INTRAVENOUS | Status: DC
Start: 2015-11-30 — End: 2015-12-02
  Administered 2015-11-30: 20 ug/kg/min via INTRAVENOUS
  Administered 2015-12-01: 40 ug/kg/min via INTRAVENOUS
  Administered 2015-12-01: 50 ug/kg/min via INTRAVENOUS
  Administered 2015-12-01: 18 ug/kg/min via INTRAVENOUS
  Administered 2015-12-01 – 2015-12-02 (×4): 50 ug/kg/min via INTRAVENOUS
  Administered 2015-12-02: 45 ug/kg/min via INTRAVENOUS
  Filled 2015-11-30 (×8): qty 100

## 2015-11-30 NOTE — Progress Notes (Signed)
Per Dr. Dellie CatholicSommers the ETT was advanced 2 cm. The ETT is at 27 at the lip.

## 2015-11-30 NOTE — Progress Notes (Signed)
Patient has been sleepy all morning. Asked wife this AM if this was normal and she stated "maybe he's not this sleepy at home", but she was talking to him and did not seem concerned. When daughter arrived, she was concerned and stated her dad "never acts like this". Heart rate is currently close to 115 after giving cardizem. Rechecked blood pressure and is currently 97/60. Called Dr. Clint GuyHower and notified of recent vitals and patient being lethargic and MD stated okay to order ABG. Patient does arouse and is oriented when I can get him awake enough to answer questions. Respiratory is coming to check ABG now.

## 2015-11-30 NOTE — ED Notes (Signed)
Dr. Diamond in to see pt.  

## 2015-11-30 NOTE — ED Notes (Signed)
Dr Manson PasseyBrown in to update pt on plan of care

## 2015-11-30 NOTE — Progress Notes (Signed)
Patient arrived from 2A, placed on 30% Bipap by Amy, RT. Patient arousable. ST on cardiac monitor. Dr. Sung AmabileSimonds at bedside to assess patient, orders entered.

## 2015-11-30 NOTE — Progress Notes (Signed)
Patient SATs declining to 70%, Amy RT at bedside, increased Bipap to 75%, SATs increased to 89%. Per Dr. Sung AmabileSimonds, parameters for Bipap 88-94%.

## 2015-11-30 NOTE — Progress Notes (Signed)
PA for cardiology [place] on floor. Notified of heart rate consistently staying at 130, ST. Night shift gave a dose of cardizem at 640am and it brought patient down to the upper 120's briefly. BP has been running soft. Cardiology is to see patient and will add orders. Patient currently sleeping.

## 2015-11-30 NOTE — H&P (Signed)
Dustin [name] is an 60 y.o. male.   Chief Complaint: Chest pain HPI: The patient presents emergency department complaining of chest pain. He states that he began the morning prior to admission while at rest. The pain was approximately 6-8 out of 10 at that time. His physical therapist arrives to start reconditioning exercises and found his blood pressure to be low and recommended postponing therapy until another day. At that time the patient admits that he felt dizzy and diaphoretic. He laid down to take a nap but awoke with a headache. His chest pain had significant decreased to possibly 4 out of 10 in severity but again returned this evening and is possibly 6 out of 10 in severity at this time. The pain is specifically under his left rib cage. He admits that it radiates into his left arm at times. He admits to nausea but denies emesis. Past medical history is significant for emphysema. The patient is on 2 L of oxygen via nasal cannula continuously at home. He is maintaining oxygen saturations 88-92% on baseline oxygen therapy but continues to have chest pain. Thus, the hospitalist service was contacted for continued evaluation.  Past Medical History  Diagnosis Date  . Asthma   . COPD (chronic obstructive pulmonary disease) ([place])   . Allergy   . Anxiety   . Chronic diastolic CHF (congestive heart failure) ([place])     a. echo 07/2013: EF 60-65%, DD, biatrial dilatation, Ao sclerosis, dilated RV, moderate pulmonary HTN, elevated CV and RA pressures; b. patient reported echo at Dr. Laurelyn Sickle office 02/2015 - his office does not have record of him being a pt there   . Depression   . Emphysema of lung ([place])   . GERD (gastroesophageal reflux disease)   . Tobacco abuse   . Depression   . Chronic respiratory failure ([facility])     a. on 2L via nasal cannula; b. secondary to COPD  . Personal history of tobacco use, presenting hazards to health 08/17/2015    Past Surgical History  Procedure Laterality Date  .  Abdominal surgery    . Nose surgery    . Hemorrh      Family History  Problem Relation Age of Onset  . Diabetes Neg Hx    Social History:  reports that he has been smoking Cigarettes.  He has a 64.5 pack-year smoking history. He does not have any smokeless tobacco history on file. He reports that he does not drink alcohol or use illicit drugs.  Allergies:  Allergies  Allergen Reactions  . Asa [Aspirin] Other (See Comments)    Reaction: swelling of the right side.  . Codeine Other (See Comments)    Reaction: Difficulty breathing  . Ibuprofen Other (See Comments)    Reaction: Swelling of the right side.    Prior to Admission medications   Medication Sig Start Date End Date Taking? Authorizing Provider  Albuterol Sulfate (PROAIR RESPICLICK) 324 (90 BASE) MCG/ACT AEPB Inhale 2 puffs into the lungs every 4 (four) hours as needed (for shortness of breath).   Yes Historical Provider, MD  ALPRAZolam Duanne Moron) 1 MG tablet Take 0.5-1 tablets (0.5-1 mg total) by mouth 3 (three) times daily as needed for anxiety. 04/21/15  Yes Demetrios Loll, MD  doxazosin (CARDURA) 4 MG tablet Take 4 mg by mouth daily.   Yes Historical Provider, MD  escitalopram (LEXAPRO) 20 MG tablet Take 20 mg by mouth at bedtime.    Yes Historical Provider, MD  gabapentin (NEURONTIN) 600 MG  tablet Take 600 mg by mouth 3 (three) times daily.   Yes Historical Provider, MD  ipratropium-albuterol (DUONEB) 0.5-2.5 (3) MG/3ML SOLN Take 3 mLs by nebulization 4 (four) times daily as needed (for shortness of breath).   Yes Historical Provider, MD  mometasone-formoterol (DULERA) 200-5 MCG/ACT AERO Inhale 2 puffs into the lungs 2 (two) times daily.   Yes Historical Provider, MD  theophylline (UNIPHYL) 400 MG 24 hr tablet Take 400 mg by mouth 2 (two) times daily.   Yes Historical Provider, MD     Results for orders placed or performed during the hospital encounter of 11/30/15 (from the past 48 hour(s))  CBC with Differential     Status:  Abnormal   Collection Time: 11/30/15  1:02 AM  Result Value Ref Range   WBC 7.3 3.8 - 10.6 K/uL   RBC 4.12 (L) 4.40 - 5.90 MIL/uL   Hemoglobin 13.2 13.0 - 18.0 g/dL   HCT 39.9 (L) 40.0 - 52.0 %   MCV 96.9 80.0 - 100.0 fL   MCH 32.0 26.0 - 34.0 pg   MCHC 33.0 32.0 - 36.0 g/dL   RDW 14.3 11.5 - 14.5 %   Platelets 173 150 - 440 K/uL   Neutrophils Relative % 66 %   Neutro Abs 4.8 1.4 - 6.5 K/uL   Lymphocytes Relative 23 %   Lymphs Abs 1.7 1.0 - 3.6 K/uL   Monocytes Relative 7 %   Monocytes Absolute 0.5 0.2 - 1.0 K/uL   Eosinophils Relative 3 %   Eosinophils Absolute 0.2 0 - 0.7 K/uL   Basophils Relative 1 %   Basophils Absolute 0.0 0 - 0.1 K/uL  Troponin I     Status: None   Collection Time: 11/30/15  1:02 AM  Result Value Ref Range   Troponin I <0.03 <0.031 ng/mL    Comment:        NO INDICATION OF MYOCARDIAL INJURY.   Brain natriuretic peptide     Status: None   Collection Time: 11/30/15  1:02 AM  Result Value Ref Range   B Natriuretic Peptide 24.0 0.0 - 100.0 pg/mL  Basic metabolic panel     Status: Abnormal   Collection Time: 11/30/15  1:02 AM  Result Value Ref Range   Sodium 141 135 - 145 mmol/L   Potassium 3.9 3.5 - 5.1 mmol/L   Chloride 103 101 - 111 mmol/L   CO2 32 22 - 32 mmol/L   Glucose, Bld 160 (H) 65 - 99 mg/dL   BUN 18 6 - 20 mg/dL   Creatinine, Ser 1.00 0.61 - 1.24 mg/dL   Calcium 8.9 8.9 - 10.3 mg/dL   GFR calc non Af Amer >60 >60 mL/min   GFR calc Af Amer >60 >60 mL/min    Comment: (NOTE) The eGFR has been calculated using the CKD EPI equation. This calculation has not been validated in all clinical situations. eGFR's persistently <60 mL/min signify possible Chronic Kidney Disease.    Anion gap 6 5 - 15   Ct Angio Chest Pe W/cm &/or Wo Cm  11/30/2015  CLINICAL DATA:  Chest pain, dyspnea, tachypnea EXAM: CT ANGIOGRAPHY CHEST WITH CONTRAST TECHNIQUE: Multidetector CT imaging of the chest was performed using the standard protocol during bolus  administration of intravenous contrast. Multiplanar CT image reconstructions and MIPs were obtained to evaluate the vascular anatomy. CONTRAST:  18m OMNIPAQUE IOHEXOL 350 MG/ML SOLN COMPARISON:  08/18/2015 FINDINGS: THORACIC INLET/BODY WALL: No acute abnormality. MEDIASTINUM: Normal heart size. Chronic anterior low-density pericardial  effusion measuring up to 16 mm thickness. No evidence of pulmonary embolism. No indication of acute aortic syndrome. LUNG WINDOWS: There is no edema, consolidation, effusion, or pneumothorax. Panlobular emphysema. Stable 9 mm pulmonary nodule in the right lateral costophrenic sulcus. This nodule is followed in a lung cancer screening program. Subsegmental basilar atelectasis. UPPER ABDOMEN: No acute findings.  Gastrosplenic ligament surgical clips. OSSEOUS: No acute fracture.  No suspicious lytic or blastic lesions. Review of the MIP images confirms the above findings. IMPRESSION: 1. No evidence of pulmonary embolism or other acute finding. 2. Chronic pericardial effusion. 3. Emphysema. Electronically Signed   By: Monte Fantasia M.D.   On: 11/30/2015 03:13   Dg Chest Portable 1 View  11/30/2015  CLINICAL DATA:  Tachycardia, shortness of breath and hypotension. History of COPD, tobacco abuse. EXAM: PORTABLE CHEST 1 VIEW COMPARISON:  Chest radiograph July 12, 2015 FINDINGS: The cardiac silhouette appears mildly enlarged, mediastinal silhouette is nonsuspicious. Apical bullous changes, with chronic interstitial changes lung grade bases Ing increased lung volumes compatible with COPD. No pleural effusion or focal consolidation. No pneumothorax. Soft tissue planes and included osseous structure nonsuspicious. IMPRESSION: Mild cardiomegaly and COPD without superimposed acute pulmonary process. Electronically Signed   By: Elon Alas M.D.   On: 11/30/2015 01:11    Review of Systems  Constitutional: Negative for fever and chills.  HENT: Negative for sore throat and  tinnitus.   Eyes: Negative for blurred vision and redness.  Respiratory: Negative for cough and shortness of breath.   Cardiovascular: Positive for chest pain. Negative for palpitations, orthopnea and PND.  Gastrointestinal: Negative for nausea, vomiting, abdominal pain and diarrhea.  Genitourinary: Negative for dysuria, urgency and frequency.  Musculoskeletal: Negative for myalgias and joint pain.  Skin: Negative for rash.       No lesions  Neurological: Negative for speech change, focal weakness and weakness.  Endo/Heme/Allergies: Does not bruise/bleed easily.       No temperature intolerance  Psychiatric/Behavioral: Negative for depression and suicidal ideas.    Blood pressure 107/71, pulse 117, temperature 98 F (36.7 C), temperature source Oral, resp. rate 15, height _0  (1.778 m), weight 84.369 kg (186 lb), SpO2 92 %. Physical Exam  Nursing note and vitals reviewed. Constitutional: He is oriented to person, place, and time. He appears well-developed and well-nourished. No distress.  HENT:  Head: Normocephalic and atraumatic.  Right Ear: External ear normal.  Mouth/Throat: No oropharyngeal exudate.  Eyes: Conjunctivae and EOM are normal. Pupils are equal, round, and reactive to light. No scleral icterus.  Neck: Normal range of motion. Neck supple. No JVD present. No tracheal deviation present. No thyromegaly present.  Cardiovascular: Normal rate, regular rhythm and normal heart sounds.  Exam reveals no gallop and no friction rub.   No murmur heard. Respiratory: Effort normal. No respiratory distress. He has wheezes.  GI: Soft. Bowel sounds are normal. He exhibits no distension. There is no tenderness.  Genitourinary:  Deferred  Musculoskeletal: Normal range of motion. He exhibits no edema.  Lymphadenopathy:    He has no cervical adenopathy.  Neurological: He is alert and oriented to person, place, and time. He has normal reflexes. No cranial nerve deficit.  Skin: Skin is  warm and dry. No rash noted. No erythema.  Psychiatric: He has a normal mood and affect. His behavior is normal. Judgment and thought content normal.     Assessment/Plan This is a 60 year old male admitted for chest pain. 1. Chest pain: Atypical; duration of pain is longer  than that usually associated with angina. However, the patient has multiple risk factors for coronary artery disease including tobacco abuse and family history. He does not take aspirin due to allergy secondary to asthma. In the emergency department the patient was found to have an abnormal troponin. He continues to have chest pain. Heart rate is tachycardic and the patient has relative hypotension as well. Some confusion regarding his home medication list given the symptoms that he displays at this time. We will restart diltiazem (patient reports not taking this at home but it is on his medication list) and hold doxazosin. Follow cardiac enzymes. Cardiology consult placed. 2. COPD: Emphysema; continue supplemental oxygen to maintain oxygen saturations 88-92%. Restart Spiriva. Albuterol as needed. Continue inhaled corticosteroid and theophylline 3. Tobacco abuse: The patient admits to smoking a single cigarette occasionally. 4. Pressure: Continue Lexapro 5. DVT prophylaxis: Lovenox 6. GI prophylaxis: None The patient is a full code. Time spent on admission orders and patient care proximally 45 minutes  Harrie Foreman, MD 11/30/2015, 3:44 AM

## 2015-11-30 NOTE — Consult Note (Signed)
PULMONARY / CRITICAL CARE MEDICINE   Name: Dustin [name] MRN: [id number] DOB: 1956-02-21    ADMISSION DATE:  11/30/2015 CONSULTATION DATE:  11/30/15  REFERRING MD: [facility]  PT PROFILE:   89 M smoker with severe COPD/emphysema admitted early AM 03/14 with CP to R/O MI and transferred to ICU/SDU with severe hypercarbic respiratory failure  MAJOR EVENTS/TEST RESULTS:   INDWELLING DEVICES::   MICRO DATA: MRSA PCR 03/14 >> NEG  ANTIMICROBIALS:    HISTORY OF PRESENT ILLNESS:   Pt is lethargic and unable to provide any meaningful history  PAST MEDICAL HISTORY :  He  has a past medical history of Asthma; COPD (chronic obstructive pulmonary disease) (HCC); Allergy; Anxiety; Chronic diastolic CHF (congestive heart failure) (HCC); Depression; Emphysema of lung (H[facility]); GERD (gastroesophageal reflux disease); Tobacco abuse; Depression; Chronic respiratory failure (H[facility]); and Personal history of tobacco use, presenting hazards to health (08/17/2015).  PAST SURGICAL HISTORY: He  has past surgical history that includes Abdominal surgery; Nose surgery; and hemorrh.  Allergies  Allergen Reactions  . Asa [Aspirin] Other (See Comments)    Reaction: swelling of the right side.  . Codeine Other (See Comments)    Reaction: Difficulty breathing  . Ibuprofen Other (See Comments)    Reaction: Swelling of the right side.    No current facility-administered medications on file prior to encounter.   Current Outpatient Prescriptions on File Prior to Encounter  Medication Sig  . Albuterol Sulfate (PROAIR RESPICLICK) 108 (90 BASE) MCG/ACT AEPB Inhale 2 puffs into the lungs every 4 (four) hours as needed (for shortness of breath).  . ALPRAZolam (XANAX) 1 MG tablet Take 0.5-1 tablets (0.5-1 mg total) by mouth 3 (three) times daily as needed for anxiety.  Marland Kitchen doxazosin (CARDURA) 4 MG tablet Take 4 mg by mouth daily.  Marland Kitchen escitalopram (LEXAPRO) 20 MG tablet Take 20 mg by mouth at bedtime.   Marland Kitchen  ipratropium-albuterol (DUONEB) 0.5-2.5 (3) MG/3ML SOLN Take 3 mLs by nebulization 4 (four) times daily as needed (for shortness of breath).    FAMILY HISTORY:  His has no family status information on file.   SOCIAL HISTORY: He  reports that he has been smoking Cigarettes.  He has a 64.5 pack-year smoking history. He does not have any smokeless tobacco history on file. He reports that he does not drink alcohol or use illicit drugs.  REVIEW OF SYSTEMS:   Level 5 caveat  SUBJECTIVE:   VITAL SIGNS: BP 102/61 mmHg  Pulse 112  Temp(Src) 97.8 F (36.6 C) (Oral)  Resp 20  Ht  (1.778 m)  Wt 84.324 kg (185 lb 14.4 oz)  BMI 26.67 kg/m2  SpO2 90%  HEMODYNAMICS:    VENTILATOR SETTINGS:    INTAKE / OUTPUT:    PHYSICAL EXAMINATION: General: Chronically ill appearing, RASS -2, + F/C with some effort Neuro: CNs intact, MAEs, DTRs symmetric HEENT: NCAT, plethoric Cardiovascular: Reg, no M noted Lungs: very distant BS, no wheezes noted anteriorly Abdomen: soft, NT, +BS Ext: warm, no edema Skin: No lesions noted  LABS:  BMET  Recent Labs Lab 11/30/15 0102  NA 141  K 3.9  CL 103  CO2 32  BUN 18  CREATININE 1.00  GLUCOSE 160*    Electrolytes  Recent Labs Lab 11/30/15 0102  CALCIUM 8.9    CBC  Recent Labs Lab 11/30/15 0102  WBC 7.3  HGB 13.2  HCT 39.9*  PLT 173    Coag's No results for input(s): APTT, INR in the last 168 hours.  Sepsis Markers No results for input(s): LATICACIDVEN, PROCALCITON, O2SATVEN in the last 168 hours.  ABG  Recent Labs Lab 11/30/15 1207  PHART 7.14*  PCO2ART 127*  PO2ART 52*    Liver Enzymes No results for input(s): AST, ALT, ALKPHOS, BILITOT, ALBUMIN in the last 168 hours.  Cardiac Enzymes  Recent Labs Lab 11/30/15 0102 11/30/15 0533  TROPONINI <0.03 <0.03    Glucose No results for input(s): GLUCAP in the last 168 hours.  Imaging Ct Angio Chest Pe W/cm &/or Wo Cm  11/30/2015  CLINICAL DATA:  Chest  pain, dyspnea, tachypnea EXAM: CT ANGIOGRAPHY CHEST WITH CONTRAST TECHNIQUE: Multidetector CT imaging of the chest was performed using the standard protocol during bolus administration of intravenous contrast. Multiplanar CT image reconstructions and MIPs were obtained to evaluate the vascular anatomy. CONTRAST:  OMNIPAQUE IOHEXOL 350 MG/ML SOLN COMPARISON:  08/18/2015 FINDINGS: THORACIC INLET/BODY WALL: No acute abnormality. MEDIASTINUM: Normal heart size. Chronic anterior low-density pericardial effusion measuring up to 16 mm thickness. No evidence of pulmonary embolism. No indication of acute aortic syndrome. LUNG WINDOWS: There is no edema, consolidation, effusion, or pneumothorax. Panlobular emphysema. Stable 9 mm pulmonary nodule in the right lateral costophrenic sulcus. This nodule is followed in a lung cancer screening program. Subsegmental basilar atelectasis. UPPER ABDOMEN: No acute findings.  Gastrosplenic ligament surgical clips. OSSEOUS: No acute fracture.  No suspicious lytic or blastic lesions. Review of the MIP images confirms the above findings. IMPRESSION: 1. No evidence of pulmonary embolism or other acute finding. 2. Chronic pericardial effusion. 3. Emphysema. Electronically Signed   By: Marnee Spring M.D.   On: 11/30/2015 03:13   Dg Chest Portable 1 View  11/30/2015  CLINICAL DATA:  Tachycardia, shortness of breath and hypotension. History of COPD, tobacco abuse. EXAM: PORTABLE CHEST 1 VIEW COMPARISON:  Chest radiograph July 12, 2015 FINDINGS: The cardiac silhouette appears mildly enlarged, mediastinal silhouette is nonsuspicious. Apical bullous changes, with chronic interstitial changes lung grade bases Ing increased lung volumes compatible with COPD. No pleural effusion or focal consolidation. No pneumothorax. Soft tissue planes and included osseous structure nonsuspicious. IMPRESSION: Mild cardiomegaly and COPD without superimposed acute pulmonary process. Electronically Signed    By: Awilda Metro M.D.   On: 11/30/2015 01:11    ASSESSMENT / PLAN:  PULMONARY A: Acute on chronic hypercarbic respiratory failure Severe emphysema, O2 dependent AECOPD Smoker P:   BiPAP PRN - titrate to work of breathing and cognition  May be removed when he is more alert and responsive Nebulized steroids Nebulized bronchodilators Systemic steroids Supplemental O2 - titrate carefully to maintain SpO2 88-94% Needs aggressive counseling re: smoking cessation  CARDIOVASCULAR A:  Chest pain - trop I normal P:  Monitor Recheck Trop I in AM 03/15  RENAL A:   No issues P:   Monitor BMET intermittently Monitor I/Os Correct electrolytes as indicated Maintenance IVFs until able to take POs  GASTROINTESTINAL A:   Dysphagia (presumed) due to AMS P:   SUP: N/I NPO until cognition improves  HEMATOLOGIC A:   No issues P:  DVT px: LMWH Monitor CBC intermittently Transfuse per usual ICU guidelines   INFECTIOUS A:   No issues P:   Monitor temp, WBC count Micro and abx as above  ENDOCRINE A:   Stress induced hyperglycemia Steroid induced hyperglycemia   P:   SSI, mod scale while on systemic steroids  NEUROLOGIC A:   Acute encephalopathy due to hypercarbia P:   RASS goal: 0 Monitor closely in ICU Avoid all sedating  medications Reduce gabapentin to 300 mg TID  Billy Fischeravid Simonds, MD PCCM service Mobile [phone] Pager [phone]   11/30/2015, 1:54 PM

## 2015-11-30 NOTE — Progress Notes (Signed)
Patient lethargic this morning ABG performed reveals hypercapnic respiratory failure-placed on BiPAP transfer to stepdown unit-full note to follow

## 2015-11-30 NOTE — Progress Notes (Signed)
Bag of home medications found by housekeeping after patient was moved to CCU. Charge nurse on 2A, Elnita MaxwellCheryl, took medications down to pharmacy to be stored.

## 2015-11-30 NOTE — Procedures (Addendum)
Endotracheal Intubation: Patient required placement of an artificial airway secondary to resp failure.   Consent: Emergent.   Hand washing performed prior to starting the procedure. Upper and lower dentures removed and set aside.   Medications administered for sedation prior to procedure: Midazolam 4 mg IV,  , Fentanyl 50 mcg IV.   Procedure: A time out procedure was called and correct patient, name, & ID confirmed. Needed supplies and equipment were assembled and checked to include ETT, 10 ml syringe, Glidescope, Mac and Miller blades, suction, oxygen and bag mask valve, end tidal CO2 monitor. Patient was positioned to align the mouth and pharynx to facilitate visualization of the glottis.  Heart rate, SpO2 and blood pressure was continuously monitored during the procedure. Pre-oxygenation was conducted prior to intubation and endotracheal tube was placed through the vocal cords into the trachea.     The artificial airway was placed under direct visualization via glidescope route using a 7.5 ETT on the first attempt.    ETT was secured at 25 cm mark.    Placement was confirmed by auscuitation of lungs with good breath sounds bilaterally and no stomach sounds.  Condensation was noted on endotracheal tube.  Pulse ox %.  CO2 detector in place with appropriate color change.   Complications: None .   Chest radiograph ordered and pending.   Comments: OGT placed via glidescope.  Wells Guileseep Kaydenn Mclear, M.D.

## 2015-11-30 NOTE — Care Management (Signed)
Remains drowsy and found to have PCO2 127.  Transfer to icu due to need for continuous bipap.  Notified UR

## 2015-11-30 NOTE — Progress Notes (Signed)
Patient was admitted from the ER following c/o of chest pain. Patient was accompanied by her daughter. He was A&O X4 and denied chest pain or being in any discomfort. Dr. Sheryle Hailiamond was notified of patient's HR being in the 130s. 120mg  Cardizem was administered per order.

## 2015-11-30 NOTE — ED Notes (Signed)
Pt uprite on stretcher in exam room with no distress noted; daughter at bedside; Dr Manson PasseyBrown in to assess; pt reports "cramping" pain, intermittent in intensity to left side chest with SOB all day; st normally wears O2 at 2l/min via [place] at all times with hx COPD; congested cough noted; resp even/unlab with rhales auscultated; currently rates pain 5/10 at present

## 2015-11-30 NOTE — Progress Notes (Signed)
[facility] Orderly has been called and patient will transfer to CCU 08. Patient is still sleepy, but arousable and has been informed of transfer.

## 2015-11-30 NOTE — ED Notes (Signed)
Pt presents to ED with tachycardia, sob, and hypotension. Pt states during his physical therapy appt at home earlier this afternoon he noticed he was more short of breath than normal and his heart rate was elevated. Pt states the physical therapist would not continue his session and told him he was hypotensive. Pt reports his symptoms have continued without improving. Home O2 in place on 2L. States he did have a "cramp" in the left side of his chest that has lasted all day.

## 2015-11-30 NOTE — Consult Note (Signed)
Cardiology Consult    Patient ID: Dustin CoriaDennis R [name] MRN: [id number], DOB/AGE: 62/25/1957   Admit date: 11/30/2015 Date of Consult: 11/30/2015  Primary Physician: Lyndon CodeKHAN, FOZIA M, MD Reason for Consult: Chest Pain Primary Cardiologist: Dr. Kirke CorinArida Requesting Provider: Dr. Sheryle Hailiamond  History of Present Illness    Dustin [name] is a 60 y.o. male with past medical history of chronic diastolic CHF (EF 40-98%60-65% by echo in 07/2013), COPD (on chronic O2), GERD, and tobacco abuse who [facility] for tachycardia and hypotension occurring while at PT earlier that day.  In talking with the patient this morning, he denies any current chest pain. He does point to his left upper quadrant and says he has a cramping sensation that comes and goes. Also has associated nausea. His breakfast tray is beside him and remains untouched. Says he "does not have much of an appetite".Says his breathing is at baseline. He reports feeling very tired currently and is stoic in regards to what brought him in yesterday.   In reviewing records, he had reported having chest pain at rest starting earlier that morning. It had improved following a nap but he then had a headache. He reported the pain had been there since earlier that morning and occasionally radiated into his left scapula.  While admitted, his BP has been 94/52 - 125/72. EKG shows sinus tachycardia with PVC's and RBBB (incomplete RBBB noted on previous tracings). BMP showed stable creatinine of 1.00. No significant electrolyte abnormalities. WBC at 7.3. Hgb 13.2. Platelets 173. BNP at 24. TSH at 1.790. Initial two troponin values negative. CXR with mild cardiomegaly and COPD without superimposed acute pulmonary processes. CTA was performed and showed no evidence of a pulmonary embolism. A chronic pericardial effusion and emphysema were noted.   Past Medical History   Past Medical History  Diagnosis Date  . Asthma   . COPD (chronic obstructive  pulmonary disease) (HCC)   . Allergy   . Anxiety   . Chronic diastolic CHF (congestive heart failure) (HCC)     a. echo 07/2013: EF 60-65%, DD, biatrial dilatation, Ao sclerosis, dilated RV, moderate pulmonary HTN, elevated CV and RA pressures; b. patient reported echo at Dr. Milta DeitersKhan's office 02/2015 - his office does not have record of him being a pt there   . Depression   . Emphysema of lung (HCC)   . GERD (gastroesophageal reflux disease)   . Tobacco abuse   . Depression   . Chronic respiratory failure (HCC)     a. on 2L via nasal cannula; b. secondary to COPD  . Personal history of tobacco use, presenting hazards to health 08/17/2015    Past Surgical History  Procedure Laterality Date  . Abdominal surgery    . Nose surgery    . Hemorrh       Allergies  Allergies  Allergen Reactions  . Asa [Aspirin] Other (See Comments)    Reaction: swelling of the right side.  . Codeine Other (See Comments)    Reaction: Difficulty breathing  . Ibuprofen Other (See Comments)    Reaction: Swelling of the right side.    Inpatient Medications    . acidophilus  1 capsule Oral Daily  . diltiazem  30 mg Oral 4 times per day  . docusate sodium  100 mg Oral BID  . enoxaparin (LOVENOX) injection  40 mg Subcutaneous Q24H  . escitalopram  20 mg Oral Daily  . furosemide  40 mg Oral Daily  . gabapentin  600 mg Oral TID  . guaiFENesin  600 mg Oral BID  . [START ON 12/01/2015] Influenza vac split quadrivalent PF  0.5 mL Intramuscular Tomorrow-1000  . mometasone-formoterol  2 puff Inhalation BID  . nicotine  21 mg Transdermal Daily  . potassium chloride  10 mEq Oral BID  . sodium chloride flush  3 mL Intravenous Q12H  . theophylline  400 mg Oral Daily  . tiotropium  18 mcg Inhalation Daily    Family History    Family History  Problem Relation Age of Onset  . Diabetes Neg Hx   . CAD Father     Social History    Social History   Social History  . Marital Status: Married    Spouse Name:  N/A  . Number of Children: N/A  . Years of Education: N/A   Occupational History  . Not on file.   Social History Main Topics  . Smoking status: Current Some Day Smoker -- 1.50 packs/day for 43 years    Types: Cigarettes  . Smokeless tobacco: Not on file  . Alcohol Use: No  . Drug Use: No  . Sexual Activity: Not on file   Other Topics Concern  . Not on file   Social History Narrative     Review of Systems    General:  No chills, fever, night sweats or weight changes.  Cardiovascular:  No dyspnea on exertion, edema, orthopnea, palpitations, paroxysmal nocturnal dyspnea. Positive for chest pain. Dermatological: No rash, lesions/masses Respiratory: No cough, dyspnea Urologic: No hematuria, dysuria Abdominal:   No vomiting, diarrhea, bright red blood per rectum, melena, or hematemesis. Positive for abdominal pain and nausea. Neurologic:  No visual changes, wkns, changes in mental status. All other systems reviewed and are otherwise negative except as noted above.  Physical Exam    Blood pressure 112/58, pulse 128, temperature 97.8 F (36.6 C), temperature source Oral, resp. rate 20, height  (1.778 m), weight 185 lb 14.4 oz (84.324 kg), SpO2 88 %.  General: Pleasant, elderly Caucasian male appearing in NAD. Appears older than his stated age. Psych: Normal affect. Neuro: Alert and oriented X 3. Moves all extremities spontaneously. HEENT: Normal  Neck: Supple without bruits or JVD. Lungs:  Resp regular and unlabored, CTA without wheezing or rales. Heart: Regular rhythm, tachycardiac rate, no s3, s4, or murmurs. Abdomen: Soft, non-tender, non-distended, BS + x 4.  Extremities: No clubbing, cyanosis or edema. DP/PT/Radials 2+ and equal bilaterally.  Labs    Troponin (Point of Care Test) No results for input(s): TROPIPOC in the last 72 hours.  Recent Labs  11/30/15 0102 11/30/15 0533  TROPONINI <0.03 <0.03   Lab Results  Component Value Date   WBC 7.3 11/30/2015    HGB 13.2 11/30/2015   HCT 39.9* 11/30/2015   MCV 96.9 11/30/2015   PLT 173 11/30/2015    Recent Labs Lab 11/30/15 0102  NA 141  K 3.9  CL 103  CO2 32  BUN 18  CREATININE 1.00  CALCIUM 8.9  GLUCOSE 160*   Lab Results  Component Value Date   CHOL 145 07/02/2015   HDL 50 07/02/2015   LDLCALC 83 07/02/2015   TRIG 60 07/02/2015   No results found for: [facility]   Radiology Studies    Ct Angio Chest Pe W/cm &/or Wo Cm: 11/30/2015  CLINICAL DATA:  Chest pain, dyspnea, tachypnea EXAM: CT ANGIOGRAPHY CHEST WITH CONTRAST TECHNIQUE: Multidetector CT imaging of the chest was performed using the standard protocol during bolus  administration of intravenous contrast. Multiplanar CT image reconstructions and MIPs were obtained to evaluate the vascular anatomy. CONTRAST:  OMNIPAQUE IOHEXOL 350 MG/ML SOLN COMPARISON:  08/18/2015 FINDINGS: THORACIC INLET/BODY WALL: No acute abnormality. MEDIASTINUM: Normal heart size. Chronic anterior low-density pericardial effusion measuring up to 16 mm thickness. No evidence of pulmonary embolism. No indication of acute aortic syndrome. LUNG WINDOWS: There is no edema, consolidation, effusion, or pneumothorax. Panlobular emphysema. Stable 9 mm pulmonary nodule in the right lateral costophrenic sulcus. This nodule is followed in a lung cancer screening program. Subsegmental basilar atelectasis. UPPER ABDOMEN: No acute findings.  Gastrosplenic ligament surgical clips. OSSEOUS: No acute fracture.  No suspicious lytic or blastic lesions. Review of the MIP images confirms the above findings. IMPRESSION: 1. No evidence of pulmonary embolism or other acute finding. 2. Chronic pericardial effusion. 3. Emphysema. Electronically Signed   By: Marnee Spring M.D.   On: 11/30/2015 03:13   Dg Chest Portable 1 View: 11/30/2015  CLINICAL DATA:  Tachycardia, shortness of breath and hypotension. History of COPD, tobacco abuse. EXAM: PORTABLE CHEST 1 VIEW COMPARISON:  Chest  radiograph July 12, 2015 FINDINGS: The cardiac silhouette appears mildly enlarged, mediastinal silhouette is nonsuspicious. Apical bullous changes, with chronic interstitial changes lung grade bases Ing increased lung volumes compatible with COPD. No pleural effusion or focal consolidation. No pneumothorax. Soft tissue planes and included osseous structure nonsuspicious. IMPRESSION: Mild cardiomegaly and COPD without superimposed acute pulmonary process. Electronically Signed   By: Awilda Metro M.D.   On: 11/30/2015 01:11    EKG & Cardiac Imaging    EKG: Sinus tachycardia, HR 111, PVC's and RBBB (incomplete RBBB noted on previous tracings).   Echocardiogram: None on file  Assessment & Plan    1. Chest Pain/ Abdominal Pain - presented with chest pain occurring at rest and lasting for several hours, which is overall atypical for ACS. This morning, he denies any chest pain but does report having LUQ pain, nausea, and a decreased appetite.  - EKG shows sinus tachycardia with PVC's and RBBB (incomplete RBBB noted on previous tracings). BMP showed stable creatinine of 1.00. No significant electrolyte abnormalities.  - Initial two troponin values negative. Repeat echocardiogram to assess LV function is pending. - His CTA was personally reviewed by Dr. Mariah Milling and did not show any significant coronary calcifications. - consider obtaining an outpatient Lexiscan Myoview. Would investigate other etiologies of his presenting symptoms and tachycardia, for this morning his main complaint is abdominal pain and nausea. Further work-up of his abdominal pain per admitting team.  2. Sinus Tachycardia - CTA checked on admission with no evidence of a pulmonary embolism. A chronic pericardial effusion was identified. - TSH and BNP normal. No significant electrolyte abnormalities noted on his lab results. - continue PO Cardizem  Q6H with hold parameters in place.  3. Chronic Diastolic CHF - EF 60-65% by  echo in 07/2013. BNP normal at 24. - continue PO Cardizem as above. Would avoid BB in setting of COPD. No ACE-I currently due to hypotension.  4. COPD - on chronic O2 at home - per admitting team  Signed, Ellsworth Lennox, PA-C 11/30/2015, 10:59 AM Pager: [phone]

## 2015-11-30 NOTE — Progress Notes (Signed)
eLink Physician-Brief Progress Note Patient Name: Dustin CoriaDennis R Holladay DOB: [birth year] MRN: [id number]   Date of Service  11/30/2015  HPI/Events of Note  ABG on trial of BiPAP X 1 hour = 7.14/127/113. He will require intubation.  eICU Interventions  Dr. Nicholos Johnsamachandran notified. He will be up to evaluate and intubate the patient.     Intervention Category Major Interventions: Respiratory failure - evaluation and management  Sommer,Steven Eugene 11/30/2015, 3:32 PM

## 2015-11-30 NOTE — Progress Notes (Signed)
ABG drawn and CO2 resulted at 127. Dr. Clint GuyHower notified and respiratory is on floor. Patient will go to CCU on continuous bipap. Charge nurse aware and is placing a bed request now.

## 2015-11-30 NOTE — Progress Notes (Signed)
Amy, RT notified nurse of critical ABG. PH 7.14, CO2 127, and 02 113.   E-link was paged Dr.Sommer notified of critical value. He acknowledge, no new orders given.   Dr.Sommer called back, new orders given to prepare to intubate. Dr. Ardyth Manam was on his way.   Will continue to assess.

## 2015-11-30 NOTE — ED Notes (Signed)
PCXR completed.

## 2015-11-30 NOTE — Progress Notes (Signed)
*  PRELIMINARY RESULTS* Echocardiogram 2D Echocardiogram has been performed.  Dustin HousekeeperJerry R [name] 11/30/2015, 12:52 PM

## 2015-11-30 NOTE — Progress Notes (Signed)
[facility] [place] [facility]   PATIENT NAME: Dustin [name]    MR#:  [id number]  DATE OF BIRTH:  14-Jul-1956  SUBJECTIVE:  Patient admitted overnight after episode of chest pain, noted to be tachycardic Patient lethargic/somnolent this morning unable to provide information  REVIEW OF SYSTEMS:  Unable to obtain given patient's mental status medical condition  DRUG ALLERGIES:   Allergies  Allergen Reactions  . Asa [Aspirin] Other (See Comments)    Reaction: swelling of the right side.  . Codeine Other (See Comments)    Reaction: Difficulty breathing  . Ibuprofen Other (See Comments)    Reaction: Swelling of the right side.    VITALS:  Blood pressure 102/61, pulse 112, temperature 97.8 F (36.6 C), temperature source Oral, resp. rate 20, height  (1.778 m), weight 84.324 kg (185 lb 14.4 oz), SpO2 90 %.  PHYSICAL EXAMINATION:   VITAL SIGNS: Filed Vitals:   11/30/15 1158 11/30/15 1233  BP: 97/60 102/61  Pulse: 115 112  Temp:  97.8 F (36.6 C)  Resp:  20   GENERAL:59 y.o.male moderate distress given mental status.  HEAD: Normocephalic, atraumatic.  EYES: Pupils equal, round, reactive to light. Unable to assess extraocular muscles given mental status/medical condition. No scleral icterus.  MOUTH: Moist mucosal membrane. Dentition intact. No abscess noted.  EAR, NOSE, THROAT: Clear without exudates. No external lesions.  NECK: Supple. No thyromegaly. No nodules. No JVD.  PULMONARY: Diminished breath sounds, without wheeze rails or rhonci. No use of accessory muscles, poor respiratory effort. Poor air entry bilaterally CHEST: Nontender to palpation.  CARDIOVASCULAR: S1 and S2. Tachycardic. No murmurs, rubs, or gallops. No edema. Pedal pulses 2+ bilaterally.  GASTROINTESTINAL: Soft, nontender, nondistended. No masses. Positive bowel sounds. No hepatosplenomegaly.  MUSCULOSKELETAL: No swelling, clubbing, or edema. Range of motion full in all  extremities.  NEUROLOGIC: Unable to assess given mental status/medical condition SKIN: No ulceration, lesions, rashes, or cyanosis. Skin warm and dry. Turgor intact.  PSYCHIATRIC: Unable to assess given mental status/medical condition       LABORATORY PANEL:   CBC  Recent Labs Lab 11/30/15 0102  WBC 7.3  HGB 13.2  HCT 39.9*  PLT 173   ------------------------------------------------------------------------------------------------------------------  Chemistries   Recent Labs Lab 11/30/15 0102  NA 141  K 3.9  CL 103  CO2 32  GLUCOSE 160*  BUN 18  CREATININE 1.00  CALCIUM 8.9   ------------------------------------------------------------------------------------------------------------------  Cardiac Enzymes  Recent Labs Lab 11/30/15 0533  TROPONINI <0.03   ------------------------------------------------------------------------------------------------------------------  RADIOLOGY:  Ct Angio Chest Pe W/cm &/or Wo Cm  11/30/2015  CLINICAL DATA:  Chest pain, dyspnea, tachypnea EXAM: CT ANGIOGRAPHY CHEST WITH CONTRAST TECHNIQUE: Multidetector CT imaging of the chest was performed using the standard protocol during bolus administration of intravenous contrast. Multiplanar CT image reconstructions and MIPs were obtained to evaluate the vascular anatomy. CONTRAST:  OMNIPAQUE IOHEXOL 350 MG/ML SOLN COMPARISON:  08/18/2015 FINDINGS: THORACIC INLET/BODY WALL: No acute abnormality. MEDIASTINUM: Normal heart size. Chronic anterior low-density pericardial effusion measuring up to 16 mm thickness. No evidence of pulmonary embolism. No indication of acute aortic syndrome. LUNG WINDOWS: There is no edema, consolidation, effusion, or pneumothorax. Panlobular emphysema. Stable 9 mm pulmonary nodule in the right lateral costophrenic sulcus. This nodule is followed in a lung cancer screening program. Subsegmental basilar atelectasis. UPPER ABDOMEN: No acute findings.  Gastrosplenic  ligament surgical clips. OSSEOUS: No acute fracture.  No suspicious lytic or blastic lesions. Review of the MIP images confirms the  above findings. IMPRESSION: 1. No evidence of pulmonary embolism or other acute finding. 2. Chronic pericardial effusion. 3. Emphysema. Electronically Signed   By: Marnee SpringJonathon  Watts M.D.   On: 11/30/2015 03:13   Dg Chest Portable 1 View  11/30/2015  CLINICAL DATA:  Tachycardia, shortness of breath and hypotension. History of COPD, tobacco abuse. EXAM: PORTABLE CHEST 1 VIEW COMPARISON:  Chest radiograph July 12, 2015 FINDINGS: The cardiac silhouette appears mildly enlarged, mediastinal silhouette is nonsuspicious. Apical bullous changes, with chronic interstitial changes lung grade bases Ing increased lung volumes compatible with COPD. No pleural effusion or focal consolidation. No pneumothorax. Soft tissue planes and included osseous structure nonsuspicious. IMPRESSION: Mild cardiomegaly and COPD without superimposed acute pulmonary process. Electronically Signed   By: Awilda Metroourtnay  Bloomer M.D.   On: 11/30/2015 01:11    EKG:   Orders placed or performed during the hospital encounter of 11/30/15  . EKG 12-Lead  . EKG 12-Lead  . ED EKG  . ED EKG    ASSESSMENT AND PLAN:   60 year old Caucasian gentleman who COPD chronic oxygen requiring admitted 11/30/2015 after results of chest pain  1. Acute on chronic respiratory failure with hypercapnia: Place on BiPAP, transfer to stepdown, repeat ABG 1 hour, breathing treatments, steroids 2. Sinus tachycardia: Cardiology input appreciated continued Cardizem 3. Depression unspecified Lexapro 4. Venous thromboembolism prophylactic: Lovenox     All the records are reviewed and case discussed with Care Management/Social Workerr. Management plans discussed with the patient, family and they are in agreement.  CODE STATUS: Full  TOTAL TIME TAKING CARE OF THIS PATIENT: 50 critical care minutes.   POSSIBLE D/C IN 3-4 DAYS,  DEPENDING ON CLINICAL CONDITION.   Rhonda Linan,  Mardi MainlandDavid K M.D on 11/30/2015 at 12:59 PM  Between 7am to 6pm - Pager - [phone]  After 6pm: House Pager: - [phone]  [name]  Office  [phone]  CC: Primary care physician; Lyndon CodeKHAN, FOZIA M, MD

## 2015-11-30 NOTE — Progress Notes (Signed)
eLink Physician-Brief Progress Note Patient Name: Dustin CoriaDennis R Schlosser DOB: 11-18-1955 MRN: [id number]   Date of Service  11/30/2015  HPI/Events of Note  ABG on 75%/PRVC 18/TV 450/P 8 = 7.24/90/232.  eICU Interventions  Will increase the PRVC rate to 20 and recheck ABG at 9 PM.     Intervention Category Major Interventions: Acid-Base disturbance - evaluation and management;Respiratory failure - evaluation and management  Lenell AntuSommer,Breckin Zafar Eugene 11/30/2015, 7:44 PM

## 2015-11-30 NOTE — ED Notes (Signed)
Pt to CT via stretcher accomp by CT tech 

## 2015-11-30 NOTE — Progress Notes (Signed)
Heart rate is currently in the low 120's after giving oral cardizem. Will monitor closely and notify cardiology as needed. Patient's family member at bedside reports his home health physical therapist has noted an elevated heart rate for the past week.

## 2015-11-30 NOTE — ED Provider Notes (Signed)
[facility] Emergency Department Provider Note  ____________________________________________  Time seen: 1:30 AM  I have reviewed the triage vital signs and the nursing notes.   HISTORY  Chief Complaint Respiratory Distress and Hypotension      [name] is a 60 y.o. male with history of congestive heart failure and COPD presents with chest pain shortness of breath and hypotension noted yesterday afternoon by physical therapy at home. Patient states that he's had a persistent and progressive 8 out of 10 left-sided chest pain described as cramping all day yesterday accompanied by shortness of breath and tachycardia. She denies any dizziness no nausea.     Past Medical History  Diagnosis Date  . Asthma   . COPD (chronic obstructive pulmonary disease) (HCC)   . Allergy   . Anxiety   . Chronic diastolic CHF (congestive heart failure) (HCC)     a. echo 07/2013: EF 60-65%, DD, biatrial dilatation, Ao sclerosis, dilated RV, moderate pulmonary HTN, elevated CV and RA pressures; b. patient reported echo at Dr. Milta Deiters office 02/2015 - his office does not have record of him being a pt there   . Depression   . Emphysema of lung (HCC)   . GERD (gastroesophageal reflux disease)   . Tobacco abuse   . Depression   . Chronic respiratory failure (HCC)     a. on 2L via nasal cannula; b. secondary to COPD  . Personal history of tobacco use, presenting hazards to health 08/17/2015    Patient Active Problem List   Diagnosis Date Noted  . Personal history of tobacco use, presenting hazards to health 08/17/2015  . Pressure ulcer 04/19/2015  . COPD with acute exacerbation (HCC)   . Acute on chronic diastolic CHF (congestive heart failure) (HCC)   . COPD with respiratory distress, acute (HCC) 04/13/2015  . COPD exacerbation (HCC) 03/31/2015    Past Surgical History  Procedure Laterality Date  . Abdominal surgery    . Nose surgery    . Hemorrh       Current Outpatient Rx  Name  Route  Sig  Dispense  Refill  . acidophilus ([name]) CAPS capsule   Oral   Take 1 capsule by mouth daily.   30 capsule   0   . Albuterol Sulfate (PROAIR RESPICLICK) 108 (90 BASE) MCG/ACT AEPB   Inhalation   Inhale 2 puffs into the lungs every 4 (four) hours as needed (for shortness of breath).         . ALPRAZolam (XANAX) 1 MG tablet   Oral   Take 0.5-1 tablets (0.5-1 mg total) by mouth 3 (three) times daily as needed for anxiety.   30 tablet   0   . azithromycin (ZITHROMAX) 250 MG tablet   Oral   Take 1 tablet (250 mg total) by mouth daily.   6 tablet   0   . diltiazem (CARDIZEM) 120 MG tablet   Oral   Take 120 mg by mouth daily.         Marland Kitchen doxazosin (CARDURA) 4 MG tablet   Oral   Take 4 mg by mouth daily.         Marland Kitchen escitalopram (LEXAPRO) 20 MG tablet   Oral   Take 20 mg by mouth daily.         . Fluticasone-Salmeterol (ADVAIR DISKUS) 250-50 MCG/DOSE AEPB   Inhalation   Inhale 1 puff into the lungs 2 (two) times daily.   60 each   0   .  furosemide (LASIX) 40 MG tablet   Oral   Take 1 tablet (40 mg total) by mouth 2 (two) times daily.   30 tablet   0   . gabapentin (NEURONTIN) 300 MG capsule   Oral   Take 2 capsules (600 mg total) by mouth 3 (three) times daily.   30 capsule   0   . guaiFENesin (MUCINEX) 600 MG 12 hr tablet   Oral   Take 1 tablet (600 mg total) by mouth 2 (two) times daily.   30 tablet   0   . ipratropium-albuterol (DUONEB) 0.5-2.5 (3) MG/3ML SOLN   Nebulization   Take 3 mLs by nebulization 4 (four) times daily as needed (for shortness of breath).         . nicotine (NICODERM CQ - DOSED IN MG/24 HOURS) 21 mg/24hr patch   Transdermal   Place 1 patch (21 mg total) onto the skin daily.   28 patch   0   . potassium chloride (K-DUR,KLOR-CON) 10 MEQ tablet   Oral   Take 1 tablet (10 mEq total) by mouth 2 (two) times daily.   30 tablet   0   . predniSONE (DELTASONE) 10 MG tablet       Label  & dispense according to the schedule below. 5 Pills PO for 2 days then, 4 Pills PO for 2 days, 3 Pills PO for 2 days, 2 Pills PO for 2 days, 1 Pill PO for 2 days then STOP.   30 tablet   0   . theophylline (THEO-24) 400 MG 24 hr capsule   Oral   Take 400 mg by mouth daily.         Marland Kitchen tiotropium (SPIRIVA) 18 MCG inhalation capsule   Inhalation   Place 1 capsule (18 mcg total) into inhaler and inhale daily.   30 capsule   0     Allergies Asa; Codeine; and Ibuprofen  Family History  Problem Relation Age of Onset  . Diabetes Neg Hx     Social History Social History  Substance Use Topics  . Smoking status: Current Some Day Smoker -- 1.50 packs/day for 43 years    Types: Cigarettes  . Smokeless tobacco: None  . Alcohol Use: No    Review of Systems  Constitutional: Negative for fever. Eyes: Negative for visual changes. ENT: Negative for sore throat. Cardiovascular: Positive for chest pain. Respiratory: Positive for shortness of breath. Positive for tachycardia Gastrointestinal: Negative for abdominal pain, vomiting and diarrhea. Genitourinary: Negative for dysuria. Musculoskeletal: Negative for back pain. Skin: Negative for rash. Neurological: Negative for headaches, focal weakness or numbness.   10-point ROS otherwise negative.  ____________________________________________   PHYSICAL EXAM:  VITAL SIGNS: ED Triage Vitals  Enc Vitals Group     BP 11/30/15 0037 115/65 mmHg     Pulse Rate 11/30/15 0037 110     Resp 11/30/15 0037 24     Temp 11/30/15 0037 98 F (36.7 C)     Temp Source 11/30/15 0037 Oral     SpO2 11/30/15 0037 89 %     Weight 11/30/15 0037 186 lb (84.369 kg)     Height 11/30/15 0037  (1.778 m)     Head Cir --      Peak Flow --      Pain Score 11/30/15 0038 5     Pain Loc --      Pain Edu? --      Excl. in GC? --  Constitutional: Alert and oriented. Well appearing and in no distress. Eyes: Conjunctivae are normal.  PERRL. Normal extraocular movements. ENT   Head: Normocephalic and atraumatic.   Nose: No congestion/rhinnorhea.   Mouth/Throat: Mucous membranes are moist.   Neck: No stridor. Hematological/Lymphatic/Immunilogical: No cervical lymphadenopathy. Cardiovascular: Tachycardia, regular rhythm. Normal and symmetric distal pulses are present in all extremities. No murmurs, rubs, or gallops. Respiratory: Normal respiratory effort without tachypnea nor retractions. Breath sounds are clear and equal bilaterally. No wheezes/rales/rhonchi. Gastrointestinal: Soft and nontender. No distention. There is no CVA tenderness. Genitourinary: deferred Musculoskeletal: Nontender with normal range of motion in all extremities. No joint effusions.  No lower extremity tenderness nor edema. Neurologic:  Normal speech and language. No gross focal neurologic deficits are appreciated. Speech is normal.  Skin:  Skin is warm, dry and intact. No rash noted. Psychiatric: Mood and affect are normal. Speech and behavior are normal. Patient exhibits appropriate insight and judgment.  ____________________________________________    LABS (pertinent positives/negatives) ` Labs Reviewed  CBC WITH DIFFERENTIAL/PLATELET - Abnormal; Notable for the following:    RBC 4.12 (*)    HCT 39.9 (*)    All other components within normal limits  BASIC METABOLIC PANEL - Abnormal; Notable for the following:    Glucose, Bld 160 (*)    All other components within normal limits  TROPONIN I  BRAIN NATRIURETIC PEPTIDE     ____________________________________________   EKG  ED ECG REPORT I, [place] N BROWN, the attending physician, personally viewed and interpreted this ECG.   Date: 11/30/2015  EKG Time: 12:36AM  Rate: 111  Rhythm: Sinus tachycardia right bundle-branch block  Axis: none  Intervals: Normal  ST&T Change: None   ____________________________________________    RADIOLOGY  DG Chest Portable 1 View  (Final result) Result time: 11/30/15 01:11:48   Final result by Rad Results In Interface (11/30/15 01:11:48)   Narrative:   CLINICAL DATA: Tachycardia, shortness of breath and hypotension. History of COPD, tobacco abuse.  EXAM: PORTABLE CHEST 1 VIEW  COMPARISON: Chest radiograph July 12, 2015  FINDINGS: The cardiac silhouette appears mildly enlarged, mediastinal silhouette is nonsuspicious. Apical bullous changes, with chronic interstitial changes lung grade bases Ing increased lung volumes compatible with COPD. No pleural effusion or focal consolidation. No pneumothorax. Soft tissue planes and included osseous structure nonsuspicious.  IMPRESSION: Mild cardiomegaly and COPD without superimposed acute pulmonary process.   Electronically Signed By: Awilda Metroourtnay Bloomer M.D. On: 11/30/2015 01:11       INITIAL IMPRESSION / ASSESSMENT AND PLAN / ED COURSE  Pertinent labs & imaging results that were available during my care of the patient were reviewed by me and considered in my medical decision making (see chart for details).  Patient did not receive aspirin secondary to allergy. Patient discussed with Dr. Johnathan Hausendiamond Frost with admission for further evaluation and management   ____________________________________________   FINAL CLINICAL IMPRESSION(S) / ED DIAGNOSES  Final diagnoses:  Chest pain, unspecified chest pain type      Darci Currentandolph N Brown, MD 11/30/15 [phone]

## 2015-11-30 NOTE — Progress Notes (Signed)
Pt is on ventilator, with propofol for sedation. No family at bedside.  Pt is not showing signs of pain when using the CPOT scale.    Report given to [place], [place]

## 2015-11-30 NOTE — ED Notes (Addendum)
Attempted IV start to left upper arm with 22ga cath without success; 2nd RN called to room for attempt; pt reports that is he is usually difficult stick

## 2015-11-30 NOTE — Care Management Obs Status (Signed)
MEDICARE OBSERVATION STATUS NOTIFICATION   Patient Details  Name: Dustin [name] MRN: [id number] Date of Birth: 1956/05/06   Medicare Observation Status Notification Given:  No  Has initially declined to sign.  Is too sleepy.  Will try again later    Eber HongGreene, Tonimarie Gritz R, RN 11/30/2015, 8:40 AM

## 2015-11-30 NOTE — Progress Notes (Signed)
eLink Physician-Brief Progress Note Patient Name: Dustin CoriaDennis R Biller DOB: 03-14-1956 MRN: [id number]   Date of Service  11/30/2015  HPI/Events of Note  ABG on 55%/PRVC 14/TV 450/P 5 = 7.17/123/42. However, sat on bedside monitor = 100%.  BP = 86/62.  eICU Interventions  Will order: 1. Increase PRVC rate to 18 and PEEP to 8.  2. ABG at 7:15 PM. 3. 0.9 NaCl 1 liter IV over 1 hour now.       Intervention Category Major Interventions: Acid-Base disturbance - evaluation and management;Respiratory failure - evaluation and management  Lenell AntuSommer,Leelah Hanna Eugene 11/30/2015, 6:11 PM

## 2015-11-30 NOTE — Progress Notes (Signed)
eLink Physician-Brief Progress Note Patient Name: Dustin CoriaDennis R Vickers DOB: Oct 12, 1955 MRN: [id number]   Date of Service  11/30/2015  HPI/Events of Note  ABG on 60%/PRVC 20/TV 450/P 8 = 7.32/81/123.  eICU Interventions  Continue present ventilator management.      Intervention Category Major Interventions: Acid-Base disturbance - evaluation and management;Respiratory failure - evaluation and management  Lenell AntuSommer,Steven Eugene 11/30/2015, 9:29 PM

## 2015-11-30 NOTE — Care Management (Addendum)
Place in observation for chest pain.  Initial troponin is negative.  Patient has chronic home 02 at home and is currently open to a home health agency.  Patient asks that care manager come back later to discuss as he is too sleepy.  His heart rate is sustained > 100 and started on cardizem orally.  It is reported during progression home health staff have noted elevated heart rate over the past week.

## 2015-11-30 NOTE — Progress Notes (Signed)
Nutrition Follow-up     INTERVENTION:   Coordination of Care: noted plan for intubation, if remains on vent >24-48 hours, recommend initiation of EN   NUTRITION DIAGNOSIS:   Inadequate oral intake related to acute illness as evidenced by NPO status.  GOAL:   Patient will meet greater than or equal to 90% of their needs  MONITOR:   PO intake, Diet advancement, Labs, Weight trends, I & O's  REASON FOR ASSESSMENT:   Malnutrition Screening Tool    ASSESSMENT:    Pt admitted with chest pain, tachycardic; pt lethargic this AM, transferred to ICU with severe hypercarbic respiratory failure with hx of severe COPD, pt on Bipap, with plans for intubation  Past Medical History  Diagnosis Date  . Asthma   . COPD (chronic obstructive pulmonary disease) (HCC)   . Allergy   . Anxiety   . Chronic diastolic CHF (congestive heart failure) (HCC)     a. echo 07/2013: EF 60-65%, DD, biatrial dilatation, Ao sclerosis, dilated RV, moderate pulmonary HTN, elevated CV and RA pressures; b. patient reported echo at Dr. Milta DeitersKhan's office 02/2015 - his office does not have record of him being a pt there   . Depression   . Emphysema of lung (HCC)   . GERD (gastroesophageal reflux disease)   . Tobacco abuse   . Depression   . Chronic respiratory failure (HCC)     a. on 2L via nasal cannula; b. secondary to COPD  . Personal history of tobacco use, presenting hazards to health 08/17/2015     Diet Order:  Diet NPO time specified   Energy Intake: recorded po intake on heart healthy diet is 75-85% of meals  Food and Nutrition Related History: unable to assess  Recent Labs Lab 11/30/15 0102  NA 141  K 3.9  CL 103  CO2 32  BUN 18  CREATININE 1.00  CALCIUM 8.9  GLUCOSE 160*   Meds: reviewed  Height:   Ht Readings from Last 1 Encounters:  11/30/15 5\' 10"  (1.778 m)    Weight: per wt encounters, pt with 7.5% wt loss in 8 months  Wt Readings from Last 1 Encounters:  11/30/15 185 lb 14.4  oz (84.324 kg)    Wt Readings from Last 10 Encounters:  11/30/15 185 lb 14.4 oz (84.324 kg)  08/18/15 194 lb (87.998 kg)  04/13/15 197 lb (89.359 kg)  03/31/15 200 lb 9.6 oz (90.992 kg)    BMI:  Body mass index is 26.67 kg/(m^2).  Estimated Nutritional Needs:   Kcal:   assess using [employer] equation on follow-up   Protein:  126-168 g (1.5-2.0 g/kg)   Fluid:  2100-2520 mL (25-30 ml/kg)   HIGH Care Level  Romelle Starcherate Jung Ingerson MS, RD, LDN [phone] Pager  [phone] Weekend/On-Call Pager

## 2015-11-30 NOTE — Progress Notes (Signed)
eLink Physician-Brief Progress Note Patient Name: Dustin CoriaDennis R [name] DOB: 11-06-1955 MRN: [id number]   Date of Service  11/30/2015  HPI/Events of Note  Multiple issues: 1. Need order for Foley catheter. 2. Needs to be ICU status.   eICU Interventions  Will order: 1. Transfer to ICU status.  2. Place Foley catheter.     Intervention Category Minor Interventions: Routine modifications to care plan (e.g. PRN medications for pain, fever)  Dustin [name] 11/30/2015, 8:00 PM

## 2015-12-01 ENCOUNTER — Inpatient Hospital Stay: Payer: Medicare Other

## 2015-12-01 DIAGNOSIS — I272 Other secondary pulmonary hypertension: Secondary | ICD-10-CM

## 2015-12-01 DIAGNOSIS — R0602 Shortness of breath: Secondary | ICD-10-CM

## 2015-12-01 DIAGNOSIS — R079 Chest pain, unspecified: Secondary | ICD-10-CM

## 2015-12-01 DIAGNOSIS — Z978 Presence of other specified devices: Secondary | ICD-10-CM | POA: Insufficient documentation

## 2015-12-01 DIAGNOSIS — J9622 Acute and chronic respiratory failure with hypercapnia: Secondary | ICD-10-CM | POA: Insufficient documentation

## 2015-12-01 DIAGNOSIS — Z789 Other specified health status: Secondary | ICD-10-CM

## 2015-12-01 DIAGNOSIS — R000 Tachycardia, unspecified: Secondary | ICD-10-CM

## 2015-12-01 LAB — BASIC METABOLIC PANEL
Anion gap: 3 — ABNORMAL LOW (ref 5–15)
BUN: 16 mg/dL (ref 6–20)
CALCIUM: 8.6 mg/dL — AB (ref 8.9–10.3)
CO2: 35 mmol/L — AB (ref 22–32)
Chloride: 101 mmol/L (ref 101–111)
Creatinine, Ser: 1.03 mg/dL (ref 0.61–1.24)
GFR calc Af Amer: >60 mL/min (ref >60)
GLUCOSE: 126 mg/dL — AB (ref 65–99)
POTASSIUM: 4.6 mmol/L (ref 3.5–5.1)
Sodium: 139 mmol/L (ref 135–145)

## 2015-12-01 LAB — GLUCOSE, CAPILLARY
Glucose-Capillary: 110 mg/dL — ABNORMAL HIGH (ref 65–99)
Glucose-Capillary: 116 mg/dL — ABNORMAL HIGH (ref 65–99)
Glucose-Capillary: 116 mg/dL — ABNORMAL HIGH (ref 65–99)
Glucose-Capillary: 119 mg/dL — ABNORMAL HIGH (ref 65–99)
Glucose-Capillary: 120 mg/dL — ABNORMAL HIGH (ref 65–99)
Glucose-Capillary: 127 mg/dL — ABNORMAL HIGH (ref 65–99)
Glucose-Capillary: 128 mg/dL — ABNORMAL HIGH (ref 65–99)
Glucose-Capillary: 133 mg/dL — ABNORMAL HIGH (ref 65–99)
Glucose-Capillary: 134 mg/dL — ABNORMAL HIGH (ref 65–99)

## 2015-12-01 LAB — CBC
HEMATOCRIT: 39.2 % — AB (ref 40.0–52.0)
Hemoglobin: 12.7 g/dL — ABNORMAL LOW (ref 13.0–18.0)
MCH: 31.6 pg (ref 26.0–34.0)
MCHC: 32.5 g/dL (ref 32.0–36.0)
MCV: 97.2 fL (ref 80.0–100.0)
Platelets: 117 10*3/uL — ABNORMAL LOW (ref 150–440)
RBC: 4.03 MIL/uL — ABNORMAL LOW (ref 4.40–5.90)
RDW: 14.7 % — AB (ref 11.5–14.5)
WBC: 5.9 10*3/uL (ref 3.8–10.6)

## 2015-12-01 LAB — TROPONIN I
TROPONIN I: 0.19 ng/mL — AB (ref <0.031)
Troponin I: 0.27 ng/mL — ABNORMAL HIGH (ref <0.031)

## 2015-12-01 LAB — APTT: APTT: 28 s (ref 24–36)

## 2015-12-01 LAB — PROTIME-INR
INR: 1.12
Prothrombin Time: 14.6 seconds (ref 11.4–15.0)

## 2015-12-01 MED ORDER — METHYLPREDNISOLONE SODIUM SUCC 40 MG IJ SOLR
40.0000 mg | Freq: Two times a day (BID) | INTRAMUSCULAR | Status: DC
  Administered 2015-12-01 – 2015-12-03 (×4): 40 mg via INTRAVENOUS
  Filled 2015-12-01 (×4): qty 1

## 2015-12-01 MED ORDER — ESCITALOPRAM OXALATE 10 MG PO TABS
20.0000 mg | ORAL_TABLET | Freq: Every day | ORAL | Status: DC
  Administered 2015-12-02: 20 mg
  Filled 2015-12-01: qty 2

## 2015-12-01 MED ORDER — PRO-STAT SUGAR FREE PO LIQD: 30.0000 mL | Freq: Two times a day (BID) | ORAL | Status: DC

## 2015-12-01 MED ORDER — VITAL HIGH PROTEIN PO LIQD: 1000.0000 mL | ORAL | Status: DC

## 2015-12-01 MED ORDER — HEPARIN (PORCINE) IN NACL 100-0.45 UNIT/ML-% IJ SOLN
1150.0000 [IU]/h | INTRAMUSCULAR | Status: DC
  Administered 2015-12-01: 1150 [IU]/h via INTRAVENOUS
  Filled 2015-12-01: qty 250

## 2015-12-01 MED ORDER — GABAPENTIN 300 MG PO CAPS
300.0000 mg | ORAL_CAPSULE | Freq: Three times a day (TID) | ORAL | Status: DC
  Administered 2015-12-01 – 2015-12-02 (×3): 300 mg
  Filled 2015-12-01 (×3): qty 1

## 2015-12-01 MED ORDER — DILTIAZEM 12 MG/ML ORAL SUSPENSION: 30.0000 mg | Freq: Four times a day (QID) | ORAL | Status: DC

## 2015-12-01 MED ORDER — PANTOPRAZOLE SODIUM 40 MG IV SOLR
40.0000 mg | INTRAVENOUS | Status: DC
  Administered 2015-12-01: 40 mg via INTRAVENOUS
  Filled 2015-12-01: qty 40

## 2015-12-01 MED ORDER — DILTIAZEM HCL 30 MG PO TABS
30.0000 mg | ORAL_TABLET | Freq: Four times a day (QID) | ORAL | Status: DC
  Administered 2015-12-01: 30 mg via ORAL
  Filled 2015-12-01 (×2): qty 1

## 2015-12-01 MED ORDER — PRO-STAT SUGAR FREE PO LIQD
30.0000 mL | Freq: Two times a day (BID) | ORAL | Status: DC
  Administered 2015-12-01 – 2015-12-02 (×2): 30 mL

## 2015-12-01 MED ORDER — FREE WATER
200.0000 mL | Freq: Three times a day (TID) | Status: DC
  Administered 2015-12-01 – 2015-12-02 (×3): 200 mL

## 2015-12-01 MED ORDER — BUDESONIDE 0.25 MG/2ML IN SUSP
0.2500 mg | Freq: Four times a day (QID) | RESPIRATORY_TRACT | Status: DC
  Administered 2015-12-01 – 2015-12-06 (×17): 0.25 mg via RESPIRATORY_TRACT
  Filled 2015-12-01 (×18): qty 2

## 2015-12-01 MED ORDER — LORAZEPAM 2 MG/ML IJ SOLN
0.5000 mg | INTRAMUSCULAR | Status: DC | PRN
Start: 2015-12-01 — End: 2015-12-02
  Administered 2015-12-01 – 2015-12-02 (×3): 1 mg via INTRAVENOUS
  Filled 2015-12-01 (×3): qty 1

## 2015-12-01 MED ORDER — HEPARIN BOLUS VIA INFUSION
4000.0000 [IU] | Freq: Once | INTRAVENOUS | Status: DC
  Filled 2015-12-01: qty 4000

## 2015-12-01 NOTE — Progress Notes (Signed)
Pt is in stable condition at this time. BP 91/59, HR 80.  Pt remains on ventilator. No signs of pain using the CPOT tool.   Pt family has came by and been updated. Pt anxiety controlled with Fentanyl 50 mg X3, and ativan 1mg  X1.   Tube feed running.  [facility] to [facility] on coming RN.

## 2015-12-01 NOTE — Progress Notes (Signed)
RT to room due to vent alarm, patient had gone into apnea ventilation.  RT placed patient back on previous settings, PRVC 20, 450, 60%, and PEEP 8.  Will continue to monitor.  Lauris PoagAmelia, RN aware and at bedside.

## 2015-12-01 NOTE — Care Management (Signed)
Barrier to discharge- now intubated and on ventilator support

## 2015-12-01 NOTE — Progress Notes (Signed)
Vent changes made per Dr. Sung AmabileSimonds order.  Patient tolerating changes well at this time, will continue to monitor.

## 2015-12-01 NOTE — Progress Notes (Signed)
PULMONARY / CRITICAL CARE MEDICINE   Name: Dustin [name] MRN: [id number] DOB: 11/12/1955    ADMISSION DATE:  11/30/2015 CONSULTATION DATE:  11/30/15  REFERRING MD: [facility]  PT PROFILE:   58 M smoker with severe COPD/emphysema admitted early AM 03/14 with CP to R/O MI and transferred to ICU/SDU with severe hypercarbic respiratory failure  MAJOR EVENTS/TEST RESULTS: 03/14 Intubated after failed BiPAP with persistent severe hypercarbia 03/14 TTE: LVEF 60-65%. No RWMAs. Grade I diastolic dysfunction  INDWELLING DEVICES:: ETT 03/14 >>   MICRO DATA: MRSA PCR 03/14 >> NEG  ANTIMICROBIALS:     SUBJECTIVE:  RASS -2, Intermittent agitation. + F/C.  VITAL SIGNS: BP 91/64 mmHg  Pulse 81  Temp(Src) 98.6 F (37 C) (Axillary)  Resp 20  Ht  (1.778 m)  Wt 86.6 kg (190 lb 14.7 oz)  BMI 27.39 kg/m2  SpO2 98%  HEMODYNAMICS:    VENTILATOR SETTINGS: Vent Mode:  [-] PRVC FiO2 (%):  [40 %-60 %] 40 % Set Rate:  [14 bmp-20 bmp] 14 bmp Vt Set:  [450 mL-600 mL] 600 mL PEEP:  [5 cmH20-8 cmH20] 5 cmH20 Plateau Pressure:  [13 cmH20-26 cmH20] 26 cmH20  INTAKE / OUTPUT: I/O last 3 completed shifts: In: 785.8 [I.V.:785.8] Out: 2280 [Urine:1580; Emesis/NG output:700]  PHYSICAL EXAMINATION: General: Chronically ill appearing, RASS -2, + F/C with some effort Neuro: CNs intact, MAEs, DTRs symmetric HEENT: NCAT, plethoric Cardiovascular: Reg, no M noted Lungs: very distant BS, no wheezes noted anteriorly Abdomen: soft, NT, +BS Ext: warm, no edema Skin: No lesions noted  LABS:  BMET  Recent Labs Lab 11/30/15 0102 12/01/15 0559  NA 141 139  K 3.9 4.6  CL 103 101  CO2 32 35*  BUN 18 16  CREATININE 1.00 1.03  GLUCOSE 160* 126*    Electrolytes  Recent Labs Lab 11/30/15 0102 12/01/15 0559  CALCIUM 8.9 8.6*    CBC  Recent Labs Lab 11/30/15 0102 12/01/15 0559  WBC 7.3 5.9  HGB 13.2 12.7*  HCT 39.9* 39.2*  PLT 173 117*    Coag's  Recent Labs Lab  12/01/15 0810  APTT 28  INR 1.12    Sepsis Markers No results for input(s): LATICACIDVEN, PROCALCITON, O2SATVEN in the last 168 hours.  ABG  Recent Labs Lab 11/30/15 1510 11/30/15 1922 11/30/15 2057  PHART 7.14* 7.24* 7.32*  PCO2ART 127* 90* 81*  PO2ART 113* 232* 123*    Liver Enzymes No results for input(s): AST, ALT, ALKPHOS, BILITOT, ALBUMIN in the last 168 hours.  Cardiac Enzymes  Recent Labs Lab 11/30/15 0533 12/01/15 0559 12/01/15 1145  TROPONINI <0.03 0.27* 0.19*    Glucose  Recent Labs Lab 11/30/15 1713 11/30/15 2000 12/01/15 0002 12/01/15 0424 12/01/15 0758 12/01/15 1147  GLUCAP 113* 102* 127* 120* 116* 128*    CXR: minimal bibasilar atelectasis   ASSESSMENT / PLAN:  PULMONARY A: Acute on chronic hypercarbic respiratory failure Severe emphysema, O2 dependent AECOPD Smoker P:   Cont vent support - settings reviewed and/or adjusted Wean in PSV mode as tolerated Cont vent bundle Daily SBT if/when meets criteria  CARDIOVASCULAR A:  Chest pain  Minimally elevated trop I P:  Monitor Cards consult noted  RENAL A:   No issues P:   Monitor BMET intermittently Monitor I/Os Correct electrolytes as indicated Maintenance IVFs until able to take POs  GASTROINTESTINAL A:   Dysphagia (presumed) due to AMS P:   SUP: N/I NPO until cognition improves  HEMATOLOGIC A:   No issues P:  DVT px: LMWH Monitor CBC intermittently Transfuse per usual ICU guidelines  INFECTIOUS A:   No issues P:   Monitor temp, WBC count Micro and abx as above  ENDOCRINE A:   Stress induced hyperglycemia Steroid induced hyperglycemia   P:   SSI, mod scale while on systemic steroids  NEUROLOGIC A:   Acute encephalopathy due to hypercarbia P:   RASS goal: 0 Monitor closely in ICU Avoid all sedating medications Reduced gabapentin to 300 mg TID  CCM time: 45 mins The above time includes time spent in consultation with patient and/or family  members and reviewing care plan on multidisciplinary rounds  Billy Fischeravid Markia Kyer, MD PCCM service Mobile [phone] Pager [phone]  12/01/2015, 3:34 PM

## 2015-12-01 NOTE — Progress Notes (Signed)
[name] [place] at [facility]   PATIENT NAME: Dustin [name]    MR#:  [id number]  DATE OF BIRTH:  Dec 26, 1955  SUBJECTIVE:  Noted elevated troponin this morning Unable to obtain patient intubated and mechanically ventilated  REVIEW OF SYSTEMS:  Unable to obtain given patient's mental status medical condition  DRUG ALLERGIES:   Allergies  Allergen Reactions  . Asa [Aspirin] Other (See Comments)    Reaction: swelling of the right side.  . Codeine Other (See Comments)    Reaction: Difficulty breathing  . Ibuprofen Other (See Comments)    Reaction: Swelling of the right side.    VITALS:  Blood pressure 91/64, pulse 81, temperature 98.6 F (37 C), temperature source Axillary, resp. rate 20, height  (1.778 m), weight 86.6 kg (190 lb 14.7 oz), SpO2 98 %.  PHYSICAL EXAMINATION:   VITAL SIGNS: Filed Vitals:   12/01/15 0800 12/01/15 1300  BP: 102/73 91/64  Pulse: 81   Temp:    Resp: 20    GENERAL:60 y.o.male critically ill-mechanically ventilated HEAD: Normocephalic, atraumatic.  EYES: Pupils equal, round, reactive to light. Unable to assess extraocular muscles given mental status/medical condition. No scleral icterus.  MOUTH: Moist mucosal membrane. Dentition intact. No abscess noted.  EAR, NOSE, THROAT: Clear without exudates. No external lesions.  NECK: Supple. No thyromegaly. No nodules. No JVD.  PULMONARY: Coarse breath sounds, without wheeze rails or rhonci. No use of accessory muscles, poor respiratory effort. Poor air entry bilaterally CHEST: Nontender to palpation.  CARDIOVASCULAR: S1 and S2. Tachycardic. No murmurs, rubs, or gallops. No edema. Pedal pulses 2+ bilaterally.  GASTROINTESTINAL: Soft, nontender, nondistended. No masses. Positive bowel sounds. No hepatosplenomegaly.  MUSCULOSKELETAL: No swelling, clubbing, or edema. Range of motion full in all extremities.  NEUROLOGIC: Unable to assess given mental status/medical  condition SKIN: No ulceration, lesions, rashes, or cyanosis. Skin warm and dry. Turgor intact.  PSYCHIATRIC: Unable to assess given mental status/medical condition       LABORATORY PANEL:   CBC  Recent Labs Lab 12/01/15 0559  WBC 5.9  HGB 12.7*  HCT 39.2*  PLT 117*   ------------------------------------------------------------------------------------------------------------------  Chemistries   Recent Labs Lab 12/01/15 0559  NA 139  K 4.6  CL 101  CO2 35*  GLUCOSE 126*  BUN 16  CREATININE 1.03  CALCIUM 8.6*   ------------------------------------------------------------------------------------------------------------------  Cardiac Enzymes  Recent Labs Lab 12/01/15 1145  TROPONINI 0.19*   ------------------------------------------------------------------------------------------------------------------  RADIOLOGY:  Dg Abd 1 View  11/30/2015  CLINICAL DATA:  NG placement EXAM: ABDOMEN - 1 VIEW COMPARISON:  None. FINDINGS: Nasogastric tube appears adequately positioned in the stomach with tip directed towards the stomach fundus. At least mildly distended gas-filled loops of bowel are noted within the adjacent midline abdomen, incompletely imaged. No evidence of free intraperitoneal air. Surgical clips noted within the left upper quadrant. IMPRESSION: Nasogastric tube appears adequately positioned in the stomach with tip directed towards the gastric fundus. Electronically Signed   By: Bary [name] M.D.   On: 11/30/2015 16:55   Ct Angio Chest Pe W/cm &/or Wo Cm  11/30/2015  CLINICAL DATA:  Chest pain, dyspnea, tachypnea EXAM: CT ANGIOGRAPHY CHEST WITH CONTRAST TECHNIQUE: Multidetector CT imaging of the chest was performed using the standard protocol during bolus administration of intravenous contrast. Multiplanar CT image reconstructions and MIPs were obtained to evaluate the vascular anatomy. CONTRAST:  OMNIPAQUE IOHEXOL 350 MG/ML SOLN COMPARISON:  08/18/2015  FINDINGS: THORACIC INLET/BODY WALL: No acute abnormality. MEDIASTINUM: Normal heart size.  Chronic anterior low-density pericardial effusion measuring up to 16 mm thickness. No evidence of pulmonary embolism. No indication of acute aortic syndrome. LUNG WINDOWS: There is no edema, consolidation, effusion, or pneumothorax. Panlobular emphysema. Stable 9 mm pulmonary nodule in the right lateral costophrenic sulcus. This nodule is followed in a lung cancer screening program. Subsegmental basilar atelectasis. UPPER ABDOMEN: No acute findings.  Gastrosplenic ligament surgical clips. OSSEOUS: No acute fracture.  No suspicious lytic or blastic lesions. Review of the MIP images confirms the above findings. IMPRESSION: 1. No evidence of pulmonary embolism or other acute finding. 2. Chronic pericardial effusion. 3. Emphysema. Electronically Signed   By: Marnee Spring M.D.   On: 11/30/2015 03:13   Dg Chest Port 1 View  12/01/2015  CLINICAL DATA:  Respiratory failure, history of emphysema EXAM: PORTABLE CHEST 1 VIEW COMPARISON:  Portable chest x-ray of 11/30/2015 FINDINGS: The tip of the endotracheal tube is approximately 5.6 cm above the carina. There is little change in linear opacities at both lung bases most consistent with bibasilar linear atelectasis. No pleural effusion is seen. Cardiomegaly is stable. NG tube extends below the hemidiaphragm. IMPRESSION: 1. Persistent bibasilar linear atelectasis. 2. Endotracheal tube tip approximately 5.6 cm above the carina. Electronically Signed   By: Dwyane Dee M.D.   On: 12/01/2015 08:06   Dg Chest Port 1 View  11/30/2015  CLINICAL DATA:  Post intubation EXAM: PORTABLE CHEST 1 VIEW COMPARISON:  Chest x-ray dated 11/30/2015. FINDINGS: Endotracheal tube appears well positioned with tip approximately 5 cm above the carina. Cardiomediastinal silhouette is stable in size and configuration. Lungs are hyperexpanded indicating COPD. Chronic scarring/fibrosis again bilaterally,  bibasilar predominant. No pleural effusions seen. No pneumothorax seen. IMPRESSION: 1. Endotracheal tube adequately positioned with tip approximately 5 cm above the carina. 2. No other interval change. Cardiomegaly. COPD. Chronic scarring/fibrosis within each lung. Electronically Signed   By: Bary [name] M.D.   On: 11/30/2015 16:58   Dg Chest Portable 1 View  11/30/2015  CLINICAL DATA:  Tachycardia, shortness of breath and hypotension. History of COPD, tobacco abuse. EXAM: PORTABLE CHEST 1 VIEW COMPARISON:  Chest radiograph July 12, 2015 FINDINGS: The cardiac silhouette appears mildly enlarged, mediastinal silhouette is nonsuspicious. Apical bullous changes, with chronic interstitial changes lung grade bases Ing increased lung volumes compatible with COPD. No pleural effusion or focal consolidation. No pneumothorax. Soft tissue planes and included osseous structure nonsuspicious. IMPRESSION: Mild cardiomegaly and COPD without superimposed acute pulmonary process. Electronically Signed   By: Awilda Metro M.D.   On: 11/30/2015 01:11    EKG:   Orders placed or performed during the hospital encounter of 11/30/15  . EKG 12-Lead  . EKG 12-Lead  . ED EKG  . ED EKG  . EKG 12-Lead  . EKG 12-Lead    ASSESSMENT AND PLAN:   60 year old Caucasian gentleman who COPD chronic oxygen requiring admitted 11/30/2015 after results of chest pain  1. Acute on chronic respiratory failure with hypercapnia: Full vent support wean FiO2 PEEP as tolerated-ABG has improved, continue steroids 2. Elevated troponin: Start on heparin this morning enzymes trended downwards-slightly demand we'll discontinue heparin at this time 2. Sinus tachycardia: Cardiology input appreciated continued Cardizem 3. Depression unspecified Lexapro 4. Venous thromboembolism prophylactic: Lovenox     All the records are reviewed and case discussed with Care Management/Social Workerr. Management plans discussed with the patient,  family and they are in agreement.  CODE STATUS: Full  TOTAL TIME TAKING CARE OF THIS PATIENT: 31critical care minutes.  POSSIBLE D/C IN 4-5 DAYS, DEPENDING ON CLINICAL CONDITION.   Hower,  Mardi MainlandDavid K M.D on 12/01/2015 at 2:38 PM  Between 7am to 6pm - Pager - [phone]  After 6pm: House Pager: - [phone]  [name]  Office  [phone]  CC: Primary care physician; Lyndon CodeKHAN, FOZIA M, MD

## 2015-12-01 NOTE — Progress Notes (Signed)
Upward trending troponin == heparin gtt until ACS ruled out - continue trending of troponin Full note to follow

## 2015-12-01 NOTE — Progress Notes (Signed)
Hospital Problem List     Active Problems:   Chest pain with low risk of acute coronary syndrome   Chronic diastolic heart failure (HCC)   Sinus tachycardia seen on cardiac monitor   Pulmonary hypertension (HCC)   Centrilobular emphysema (HCC)   Lethargy   Hypercapnic respiratory failure (HCC)   Acute respiratory failure [facility])    Patient Profile:   Primary Cardiologist: Dr. Kirke Corin  60 y.o. male w/ PMH of chronic diastolic CHF (EF 16-10% by echo in 07/2013), COPD (on chronic O2), GERD, and tobacco abuse who presented to [facility] on 11/30/2015 for tachycardia and hypotension occurring while at PT earlier that day.  Subjective   Currently intubated and sedated. Unable to obtain review of systems.  Inpatient Medications    . acidophilus  1 capsule Oral Daily  . antiseptic oral rinse  7 mL Mouth Rinse QID  . budesonide (PULMICORT) nebulizer solution  0.25 mg Nebulization 4 times per day  . chlorhexidine gluconate  15 mL Mouth Rinse BID  . diltiazem  30 mg Oral 4 times per day  . docusate sodium  100 mg Oral BID  . escitalopram  20 mg Oral Daily  . feeding supplement (PRO-STAT SUGAR FREE 64)  30 mL Oral BID  . furosemide  20 mg Intravenous Q12H  . gabapentin  300 mg Oral TID  . heparin  4,000 Units Intravenous Once  . Influenza vac split quadrivalent PF  0.5 mL Intramuscular Tomorrow-1000  . insulin aspart  0-15 Units Subcutaneous 6 times per day  . ipratropium-albuterol  3 mL Nebulization Q6H  . methylPREDNISolone (SOLU-MEDROL) injection  60 mg Intravenous Q12H  . nicotine  21 mg Transdermal Daily  . sodium chloride flush  3 mL Intravenous Q12H    Vital Signs    Filed Vitals:   12/01/15 0600 12/01/15 0700 12/01/15 0740 12/01/15 0800  BP: 99/70 110/74 110/74 102/73  Pulse: 81 87 85 81  Temp:   98.6 F (37 C)   TempSrc:   Axillary   Resp: Height:      Weight:      SpO2: 96% 96% 93% 98%    Intake/Output Summary (Last 24 hours) at 12/01/15 1136 Last  data filed at 12/01/15 0700  Gross per 24 hour  Intake  785.8 ml  Output   2280 ml  Net -1494.2 ml   Filed Weights   11/30/15 0037 11/30/15 0512 12/01/15 0400  Weight: 186 lb (84.369 kg) 185 lb 14.4 oz (84.324 kg) 190 lb 14.7 oz (86.6 kg)    Physical Exam    General: Elderly Caucasian male. Currently intubated and sedated. Head: Normocephalic, atraumatic.  Neck: Supple without bruits, JVD not elevated. Lungs:  Resp regular and unlabored, decreased breath sounds at the bases bilaterally. Heart: RRR, S1, S2, no S3, S4, or murmur; no rub. Abdomen: Soft, non-tender, non-distended with normoactive bowel sounds. No hepatomegaly. No rebound/guarding. No obvious abdominal masses. Extremities: No clubbing, cyanosis, or edema. Distal pedal pulses are 2+ bilaterally. Neuro: Currently intubated and sedated. Unable to assess at this time. Psych: Normal affect.  Labs    CBC  Recent Labs  11/30/15 0102 12/01/15 0559  WBC 7.3 5.9  NEUTROABS 4.8  --   HGB 13.2 12.7*  HCT 39.9* 39.2*  MCV 96.9 97.2  PLT 173 117*   Basic Metabolic Panel  Recent Labs  11/30/15 0102 12/01/15 0559  NA 141 139  K 3.9 4.6  CL 103 101  CO2  32 35*  GLUCOSE 160* 126*  BUN 18 16  CREATININE 1.00 1.03  CALCIUM 8.9 8.6*   Cardiac Enzymes  Recent Labs  11/30/15 0102 11/30/15 0533 12/01/15 0559  TROPONINI <0.03 <0.03 0.27*   BNP Invalid input(s): POCBNP D-Dimer No results for input(s): DDIMER in the last 72 hours. Hemoglobin A1C  Recent Labs  11/30/15 0533  HGBA1C 5.3   Fasting Lipid Panel  Recent Labs  11/30/15 1615  TRIG 61   Thyroid Function Tests  Recent Labs  11/30/15 0102  TSH 1.790    Telemetry    NSR, HR in 80's - 90's.  ECG    No new tracings.   Cardiac Studies and Radiology     Dg Chest Port 1 View: 12/01/2015  CLINICAL DATA:  Respiratory failure, history of emphysema EXAM: PORTABLE CHEST 1 VIEW COMPARISON:  Portable chest x-ray of 11/30/2015 FINDINGS: The  tip of the endotracheal tube is approximately 5.6 cm above the carina. There is little change in linear opacities at both lung bases most consistent with bibasilar linear atelectasis. No pleural effusion is seen. Cardiomegaly is stable. NG tube extends below the hemidiaphragm. IMPRESSION: 1. Persistent bibasilar linear atelectasis. 2. Endotracheal tube tip approximately 5.6 cm above the carina. Electronically Signed   By: Dwyane Dee M.D.   On: 12/01/2015 08:06   Dg Chest Port 1 View: 11/30/2015  CLINICAL DATA:  Post intubation EXAM: PORTABLE CHEST 1 VIEW COMPARISON:  Chest x-ray dated 11/30/2015. FINDINGS: Endotracheal tube appears well positioned with tip approximately 5 cm above the carina. Cardiomediastinal silhouette is stable in size and configuration. Lungs are hyperexpanded indicating COPD. Chronic scarring/fibrosis again bilaterally, bibasilar predominant. No pleural effusions seen. No pneumothorax seen. IMPRESSION: 1. Endotracheal tube adequately positioned with tip approximately 5 cm above the carina. 2. No other interval change. Cardiomegaly. COPD. Chronic scarring/fibrosis within each lung. Electronically Signed   By: Bary [name] M.D.   On: 11/30/2015 16:58   Dg Chest Portable 1 View: 11/30/2015  CLINICAL DATA:  Tachycardia, shortness of breath and hypotension. History of COPD, tobacco abuse. EXAM: PORTABLE CHEST 1 VIEW COMPARISON:  Chest radiograph July 12, 2015 FINDINGS: The cardiac silhouette appears mildly enlarged, mediastinal silhouette is nonsuspicious. Apical bullous changes, with chronic interstitial changes lung grade bases Ing increased lung volumes compatible with COPD. No pleural effusion or focal consolidation. No pneumothorax. Soft tissue planes and included osseous structure nonsuspicious. IMPRESSION: Mild cardiomegaly and COPD without superimposed acute pulmonary process. Electronically Signed   By: Awilda Metro M.D.   On: 11/30/2015 01:11    Echocardiogram:  11/30/2015 Study Conclusions - Left ventricle: The cavity size was normal. Systolic function was  normal. The estimated ejection fraction was in the range of 60%  to 65%. Wall motion was normal; there were no regional wall  motion abnormalities. Doppler parameters are consistent with  abnormal left ventricular relaxation (grade 1 diastolic  dysfunction). - Right ventricle: The cavity size was mildly dilated. Wall  thickness was normal. Systolic function was normal. - Pulmonary arteries: Systolic pressure was moderately to severely  increased. - Inferior vena cava: The vessel was dilated. The respirophasic  diameter changes were blunted (< 50%), consistent with elevated  central venous pressure. - Pericardium, extracardiac: A trivial pericardial effusion was  identified.  Impressions: - Sinus tachycardia noted.  Assessment & Plan    1. Chest Pain/ Abdominal Pain - presented with chest pain occurring at rest and lasting for several hours, which is overall atypical for ACS. On 11/30/2015, he denied  any chest pain but does report having LUQ pain, nausea, and a decreased appetite.  - EKG shows sinus tachycardia with PVC's and RBBB (incomplete RBBB noted on previous tracings). BMP showed stable creatinine of 1.00. No significant electrolyte abnormalities.  - Initial two troponin values negative. Repeat echocardiogram shows EF of 60-65% with no wall motion abnormalities. - His CTA was personally reviewed by Dr. Mariah MillingGollan and did not show any significant coronary calcifications. - consider obtaining an outpatient Lexiscan Myoview. Would investigate other etiologies of his presenting symptoms and tachycardia.  2. Sinus Tachycardia - CTA checked on admission with no evidence of a pulmonary embolism. A chronic pericardial effusion was identified. - TSH and BNP normal. No significant electrolyte abnormalities noted on his lab results. - continue PO Cardizem 30mg  Q6H with hold  parameters in place.  3. Chronic Diastolic CHF - EF 60-65% by echo in 07/2013. BNP normal at 24.  - Repeat echo this admission shows EF of 60-65%. - continue PO Cardizem as above. Would avoid BB in setting of COPD. No ACE-I currently due to hypotension.  4. COPD - on chronic O2 at home - per admitting team  5. Elevated Pulmonary Pressure - echo showed moderate to severely elevated pulmonary pressures - will need to be followed as outpatient.  6. Acute on Chronic Respiratory Failure with Hypercapnia - was more lethargic on 11/30/2015, CO2 at 127. - required transfer to the ICU with intubation. - per admitting team.  7. Elevated Troponin - initial two troponin values were negative this admission. Are being cycled again following his acute respiratory failure with initial value being 0.27. - cyclic values pending. Started on Heparin. If second value is similar to or less than previous, would discontinue Heparin. - obtain repeat EKG.   Lorri FrederickSigned, Brittany M Strader , PA-C 11:36 AM 12/01/2015 Pager: [phone]   I have seen and evaluated the patient along with Randall AnBrittany Strader, PA.  We reviewed the chart and all available data on morning Rounds and discussed the patient along with physical exam findings and recommendations.  I agree with her findings, examination and recommendations as above. Discussed with Dr. Darrol AngelSimons. It appears that he had an episode of acute on chronic hypercapnic respiratory arrest last night with PCO2 of 127. We are seeing him for sinus tachycardia which is now controlled. He has mild elevation quite likely respiratory arrest not truly cardiac in nature. Most likely demand ischemia. If his troponin trend is flat, I would stop heparin.  As long as his blood pressure tolerates, would continue with diltiazem for rate control. If necessary can convert IV equivalent dosing.  We will monitor the patient for change in troponin levels, if the trend does not appear to be  significant, will sign off pending new findings.   Marykay LexHARDING,DAVID W, M.D., M.S.  [place] [facility]  [address] [place], [name] [address] [phone] Fax [phone]

## 2015-12-01 NOTE — Progress Notes (Signed)
Dr. Sung AmabileSimonds made multiple changes to ventilator on this shift.

## 2015-12-01 NOTE — Progress Notes (Signed)
eLink Physician-Brief Progress Note Patient Name: Dustin CoriaDennis R Selsor DOB: August 10, 1956 MRN: [id number]   Date of Service  12/01/2015  HPI/Events of Note  Notified of need for DVT prophylaxis. Patient intubated and mechanically ventilated.   eICU Interventions  Will order Protonix IV.     Intervention Category Intermediate Interventions: Best-practice therapies (e.g. DVT, beta blocker, etc.)  Sommer,Steven Eugene 12/01/2015, 7:16 PM

## 2015-12-01 NOTE — Progress Notes (Signed)
Notified elink of patients temp and his c/o anxiety and if there was anything else I could give him.  RN suggested increasing propofol gtt since MAP was 75 but systolic in the 90's.  Propofol gtt increased and will reassess patients anxiety and state of consciousness with increase.  No new orders for temp given at this time.

## 2015-12-01 NOTE — Progress Notes (Signed)
RT arrived to room to perform vent check, noticed MD had placed patient on SBT 20/5 with 40% FIO2.  According to vent, previous settings were changed at 0809.  Patient appears to be tolerating well at this time, will continue to monitor.  Lauris PoagAmelia, RN aware.

## 2015-12-01 NOTE — Progress Notes (Signed)
Nutrition Follow-up    INTERVENTION:   EN: Adult Tube Feeding Protocol ordered by MD Yesterday after intubation but TF not started Discussed during ICU rounds today and plan to start TF. With current diprivan, Recommend Vital High Protein at initial rate of 20 ml/hr, goal of 55 ml/hr with Prostat BID; titration orders in order set.   NUTRITION DIAGNOSIS:   Inadequate oral intake related to acute illness as evidenced by NPO status. Being addressed via TF   GOAL:   Patient will meet greater than or equal to 90% of their needs, Provide needs based on ASPEN/SCCM guidelines   MONITOR:   TF tolerance, Vent status, I & O's, Labs, Weight trends, Skin  REASON FOR ASSESSMENT:   Malnutrition Screening Tool    ASSESSMENT:   Pt remains on vent, sedated  EN: TF ordered by MD via Adult Tube Feeding Protocol yesterday but not started, unsure of reason why, discussed during ICU rounds  Digestive System: abdomen soft, BS hypoactive, OG to LIS with bile-300 mL over night   Recent Labs Lab 11/30/15 0102 12/01/15 0559  NA 141 139  K 3.9 4.6  CL 103 101  CO2 32 35*  BUN 18 16  CREATININE 1.00 1.03  CALCIUM 8.9 8.6*  GLUCOSE 160* 126*    Glucose Profile:   Recent Labs  12/01/15 0002 12/01/15 0424 12/01/15 0758  GLUCAP 127* 120* 116*   Meds: ss novolog, LR at 50 ml/hr, diprivan (533 kcals in 24 hours based on current rate of 20.2), solumedrol  Height:   Ht Readings from Last 1 Encounters:  12/01/15 5\' 10"  (1.778 m)    Weight:   Wt Readings from Last 1 Encounters:  12/01/15 190 lb 14.7 oz (86.6 kg)    Filed Weights   11/30/15 0037 11/30/15 0512 12/01/15 0400  Weight: 186 lb (84.369 kg) 185 lb 14.4 oz (84.324 kg) 190 lb 14.7 oz (86.6 kg)    BMI:  Body mass index is 27.39 kg/(m^2).  Estimated Nutritional Needs:   Kcal:  2046 kcals (BEE 1672, Ve: 8.8, Tmax: 38.2) using wt of 86.6 kg  Protein:  126-168 g (1.5-2.0 g/kg)   Fluid:  2100-2520 mL (25-30 ml/kg)    EDUCATION NEEDS:   Education needs no appropriate at this time  HIGH Care Level  Romelle Starcherate Phillips Goulette MS, RD, LDN [phone] Pager  [phone] Weekend/On-Call Pager

## 2015-12-01 NOTE — Progress Notes (Signed)
ANTICOAGULATION CONSULT NOTE - Initial Consult  Pharmacy Consult for Heparin Indication: chest pain/ACS  Allergies  Allergen Reactions  . Asa [Aspirin] Other (See Comments)    Reaction: swelling of the right side.  . Codeine Other (See Comments)    Reaction: Difficulty breathing  . Ibuprofen Other (See Comments)    Reaction: Swelling of the right side.    Patient Measurements: Height: 5\' 10"  (177.8 cm) Weight: 190 lb 14.7 oz (86.6 kg) IBW/kg (Calculated) : 73 Heparin Dosing Weight: 86.6 kg  Vital Signs: Temp: 98.6 F (37 C) (03/15 0740) Temp Source: Axillary (03/15 0740) BP: 102/73 mmHg (03/15 0800) Pulse Rate: 81 (03/15 0800)  Labs:  Recent Labs  11/30/15 0102 11/30/15 0533 12/01/15 0559  HGB 13.2  --  12.7*  HCT 39.9*  --  39.2*  PLT 173  --  117*  CREATININE 1.00  --  1.03  TROPONINI <0.03 <0.03 0.27*    Estimated Creatinine Clearance: 77.8 mL/min (by C-G formula based on Cr of 1.03).   Medical History: Past Medical History  Diagnosis Date  . Asthma   . COPD (chronic obstructive pulmonary disease) (HCC)   . Allergy   . Anxiety   . Chronic diastolic CHF (congestive heart failure) (HCC)     a. echo 07/2013: EF 60-65%, DD, biatrial dilatation, Ao sclerosis, dilated RV, moderate pulmonary HTN, elevated CV and RA pressures; b. patient reported echo at Dr. Milta DeitersKhan's office 02/2015 - his office does not have record of him being a pt there   . Depression   . Emphysema of lung (HCC)   . GERD (gastroesophageal reflux disease)   . Tobacco abuse   . Depression   . Chronic respiratory failure (HCC)     a. on 2L via nasal cannula; b. secondary to COPD  . Personal history of tobacco use, presenting hazards to health 08/17/2015    Medications:  Scheduled:  . acidophilus  1 capsule Oral Daily  . antiseptic oral rinse  7 mL Mouth Rinse QID  . budesonide (PULMICORT) nebulizer solution  0.25 mg Nebulization 4 times per day  . chlorhexidine gluconate  15 mL Mouth Rinse  BID  . diltiazem  30 mg Oral 4 times per day  . docusate sodium  100 mg Oral BID  . escitalopram  20 mg Oral Daily  . feeding supplement (VITAL HIGH PROTEIN)  1,000 mL Per Tube Q24H  . furosemide  20 mg Intravenous Q12H  . gabapentin  300 mg Oral TID  . heparin  4,000 Units Intravenous Once  . Influenza vac split quadrivalent PF  0.5 mL Intramuscular Tomorrow-1000  . insulin aspart  0-15 Units Subcutaneous 6 times per day  . ipratropium-albuterol  3 mL Nebulization Q6H  . methylPREDNISolone (SOLU-MEDROL) injection  60 mg Intravenous Q12H  . nicotine  21 mg Transdermal Daily  . sodium chloride flush  3 mL Intravenous Q12H   Infusions:  . heparin    . lactated ringers 50 mL/hr at 12/01/15 0700  . propofol (DIPRIVAN) infusion 40 mcg/kg/min (12/01/15 0810)    Assessment: 60 y/o M with chest pain and upward trending troponin.   Goal of Therapy:  Heparin level 0.3-0.7 units/ml Monitor platelets by anticoagulation protocol: Yes   Plan:  Give 4000 units bolus x 1 Start heparin infusion at 1150 units/hr Check anti-Xa level in 6 hours and daily while on heparin Continue to monitor H&H and platelets  Luisa HartChristy, Howie Rufus D 12/01/2015,8:19 AM

## 2015-12-02 ENCOUNTER — Encounter: Payer: Self-pay | Admitting: Student

## 2015-12-02 DIAGNOSIS — J432 Centrilobular emphysema: Secondary | ICD-10-CM

## 2015-12-02 LAB — GLUCOSE, CAPILLARY
Glucose-Capillary: 122 mg/dL — ABNORMAL HIGH (ref 65–99)
Glucose-Capillary: 136 mg/dL — ABNORMAL HIGH (ref 65–99)
Glucose-Capillary: 102 mg/dL — ABNORMAL HIGH (ref 65–99)
Glucose-Capillary: 99 mg/dL (ref 65–99)
Glucose-Capillary: 118 mg/dL — ABNORMAL HIGH (ref 65–99)

## 2015-12-02 LAB — CBC
HCT: 37.8 % — ABNORMAL LOW (ref 40.0–52.0)
Hemoglobin: 12.5 g/dL — ABNORMAL LOW (ref 13.0–18.0)
MCH: 32.5 pg (ref 26.0–34.0)
MCHC: 33.2 g/dL (ref 32.0–36.0)
MCV: 98.1 fL (ref 80.0–100.0)
Platelets: 113 10*3/uL — ABNORMAL LOW (ref 150–440)
RBC: 3.86 MIL/uL — ABNORMAL LOW (ref 4.40–5.90)
RDW: 14.4 % (ref 11.5–14.5)
WBC: 9.4 10*3/uL (ref 3.8–10.6)

## 2015-12-02 LAB — BASIC METABOLIC PANEL
Anion gap: 5 (ref 5–15)
BUN: 29 mg/dL — AB (ref 6–20)
CALCIUM: 9 mg/dL (ref 8.9–10.3)
CO2: 35 mmol/L — AB (ref 22–32)
CREATININE: 1.07 mg/dL (ref 0.61–1.24)
Chloride: 98 mmol/L — ABNORMAL LOW (ref 101–111)
GFR calc non Af Amer: >60 mL/min (ref >60)
Glucose, Bld: 133 mg/dL — ABNORMAL HIGH (ref 65–99)
Potassium: 3.9 mmol/L (ref 3.5–5.1)
SODIUM: 138 mmol/L (ref 135–145)

## 2015-12-02 MED ORDER — GABAPENTIN 300 MG PO CAPS
300.0000 mg | ORAL_CAPSULE | Freq: Three times a day (TID) | ORAL | Status: DC
  Administered 2015-12-02 – 2015-12-06 (×13): 300 mg via ORAL
  Filled 2015-12-02 (×13): qty 1

## 2015-12-02 MED ORDER — ENOXAPARIN SODIUM 40 MG/0.4ML ~~LOC~~ SOLN
40.0000 mg | SUBCUTANEOUS | Status: DC
  Administered 2015-12-02 – 2015-12-06 (×5): 40 mg via SUBCUTANEOUS
  Filled 2015-12-02 (×5): qty 0.4

## 2015-12-02 MED ORDER — ESCITALOPRAM OXALATE 10 MG PO TABS
20.0000 mg | ORAL_TABLET | Freq: Every day | ORAL | Status: DC
  Administered 2015-12-03 – 2015-12-06 (×4): 20 mg via ORAL
  Filled 2015-12-02 (×4): qty 2

## 2015-12-02 NOTE — Progress Notes (Signed)
Extubated patient to 2LPM [place] per Dr. Sung AmabileSimonds order.  Patient suctioned prior to extubation and tolerated well.  Will continue to monitor.

## 2015-12-02 NOTE — Progress Notes (Addendum)
  Nutrition Follow-up   INTERVENTION:   Coordination of Care: await diet progression as medically able as pt successfully extubated this am. RD notes diet order to become effective 14:00 this afternoon. Medical Food Supplement Therapy: pt would likely benefit from supplementation once able to safely take po   NUTRITION DIAGNOSIS:   Inadequate oral intake related to acute illness as evidenced by NPO status.  GOAL:   Patient will meet greater than or equal to 90% of their needs, Provide needs based on ASPEN/SCCM guidelines  MONITOR:   TF tolerance, Vent status, I & O's, Labs, Weight trends, Skin  REASON FOR ASSESSMENT:   Malnutrition Screening Tool    ASSESSMENT:    Pt successfully extubated this am.   Diet Order:  Diet NPO time specified Diet heart healthy/carb modified Room service appropriate?: Yes; Fluid consistency:: Thin   Current Nutrition: Pt was tolerating Vital high Protein at 5055mL/hr goal rate with free water flushes of 200mL TID.   Gastrointestinal Profile: Last BM: 11/28/2015   Scheduled Medications:  . budesonide (PULMICORT) nebulizer solution  0.25 mg Nebulization Q6H WA  . diltiazem  30 mg Oral 4 times per day  . enoxaparin (LOVENOX) injection  40 mg Subcutaneous Q24H  . [START ON 12/03/2015] escitalopram  20 mg Oral Daily  . gabapentin  300 mg Oral TID  . Influenza vac split quadrivalent PF  0.5 mL Intramuscular Tomorrow-1000  . ipratropium-albuterol  3 mL Nebulization Q6H  . methylPREDNISolone (SOLU-MEDROL) injection  40 mg Intravenous Q12H  . sodium chloride flush  3 mL Intravenous Q12H    Continuous Medications:  . lactated ringers 10 mL/hr at 12/02/15 0700   RD notes pt received 645kcals from diprivan in past 24 hours    Electrolyte/Renal Profile and Glucose Profile:   Recent Labs Lab 11/30/15 0102 12/01/15 0559 12/02/15 0446  NA 141 139 138  K 3.9 4.6 3.9  CL 103 101 98*  CO2 32 35* 35*  BUN 18 16 29*  CREATININE 1.00 1.03  1.07  CALCIUM 8.9 8.6* 9.0  GLUCOSE 160* 126* 133*   Protein Profile: No results for input(s): ALBUMIN in the last 168 hours.   Weight Trend since Admission: Filed Weights   11/30/15 0512 12/01/15 0400 12/02/15 0300  Weight: 185 lb 14.4 oz (84.324 kg) 190 lb 14.7 oz (86.6 kg) 178 lb 2.1 oz (80.8 kg)    BMI:  Body mass index is 25.56 kg/(m^2).  Estimated Nutritional Needs:   Kcal:  2046 kcals (BEE 1672, Ve: 8.8, Tmax: 38.2) using wt of 86.6 kg  Protein:  126-168 g (1.5-2.0 g/kg)   Fluid:  2100-2520 mL (25-30 ml/kg)   EDUCATION NEEDS:   Education needs no appropriate at this time   HIGH Care Level  Leda QuailAllyson Anyae Griffith, RD, LDN Pager [phone] Weekend/On-Call Pager [phone]

## 2015-12-02 NOTE — Progress Notes (Signed)
PULMONARY / CRITICAL CARE MEDICINE   Name: Dustin [name] MRN: [id number] DOB: [birth year]    ADMISSION DATE:  11/30/2015 CONSULTATION DATE:  11/30/15  REFERRING MD: [facility]  PT PROFILE:   45 M smoker with severe COPD/emphysema admitted early AM 03/14 with CP to R/O MI and transferred to ICU/SDU with severe hypercarbic respiratory failure  MAJOR EVENTS/TEST RESULTS: 03/14 Intubated after failed BiPAP with persistent severe hypercarbia 03/14 TTE: LVEF 60-65%. No RWMAs. Grade I diastolic dysfunction  INDWELLING DEVICES:: ETT 03/14 >> 03/16  MICRO DATA: MRSA PCR 03/14 >> NEG  ANTIMICROBIALS:     SUBJECTIVE:  RASS 0,  + F/C. Passed SBT, extubated. Tolerating well  VITAL SIGNS: BP 127/74 mmHg  Pulse 86  Temp(Src) 99 F (37.2 C) (Oral)  Resp 19  Ht  (1.778 m)  Wt 80.8 kg (178 lb 2.1 oz)  BMI 25.56 kg/m2  SpO2 88%  HEMODYNAMICS:    VENTILATOR SETTINGS: Vent Mode:  [-] PRVC FiO2 (%):  [36 %-40 %] 36 % Set Rate:  [14 bmp] 14 bmp Vt Set:  [600 mL] 600 mL PEEP:  [5 cmH20] 5 cmH20 Plateau Pressure:  [12 cmH20-26 cmH20] 12 cmH20  INTAKE / OUTPUT: I/O last 3 completed shifts: In: 2783.1 [I.V.:1837.6; NG/GT:945.4] Out: 3530 [Urine:3230; Emesis/NG output:300]  PHYSICAL EXAMINATION: General: NAD Neuro: CNs intact, MAEs, DTRs symmetric HEENT: NCAT Cardiovascular: Reg, no M noted Lungs: very distant BS, no wheezes  Abdomen: soft, NT, +BS Ext: warm, no edema Skin: No lesions noted  LABS:  BMET  Recent Labs Lab 11/30/15 0102 12/01/15 0559 12/02/15 0446  NA 141 139 138  K 3.9 4.6 3.9  CL 103 101 98*  CO2 32 35* 35*  BUN 18 16 29*  CREATININE 1.00 1.03 1.07  GLUCOSE 160* 126* 133*    Electrolytes  Recent Labs Lab 11/30/15 0102 12/01/15 0559 12/02/15 0446  CALCIUM 8.9 8.6* 9.0    CBC  Recent Labs Lab 11/30/15 0102 12/01/15 0559 12/02/15 0446  WBC 7.3 5.9 9.4  HGB 13.2 12.7* 12.5*  HCT 39.9* 39.2* 37.8*  PLT 173 117* 113*     Coag's  Recent Labs Lab 12/01/15 0810  APTT 28  INR 1.12    Sepsis Markers No results for input(s): LATICACIDVEN, PROCALCITON, O2SATVEN in the last 168 hours.  ABG  Recent Labs Lab 11/30/15 1510 11/30/15 1922 11/30/15 2057  PHART 7.14* 7.24* 7.32*  PCO2ART 127* 90* 81*  PO2ART 113* 232* 123*    Liver Enzymes No results for input(s): AST, ALT, ALKPHOS, BILITOT, ALBUMIN in the last 168 hours.  Cardiac Enzymes  Recent Labs Lab 11/30/15 0533 12/01/15 0559 12/01/15 1145  TROPONINI <0.03 0.27* 0.19*    Glucose  Recent Labs Lab 12/01/15 1951 12/01/15 2316 12/01/15 2352 12/02/15 0313 12/02/15 0730 12/02/15 1147  GLUCAP 110* 116* 134* 122* 136* 102*    CXR: NNF   ASSESSMENT / PLAN:  PULMONARY A: Acute on chronic hypercarbic respiratory failure Severe emphysema, O2 dependent AECOPD Smoker P:   Monitor in ICU post extubation Supplemental O2 to maintain SpO2 88-94% Cont nebulized steroids Cont nebulized BDs Cont systemic steroids - dose adjusted PRN BiPAP for respiratory distress, declining LOC or refractory hypoxemia Mandatory nocturnal BiPAP for chronic hypercarbic failure  CARDIOVASCULAR A:  Chest pain  Minimally elevated trop I ASA allergy P:  Monitor Cards consult noted  RENAL A:   No issues P:   Monitor BMET intermittently Monitor I/Os Correct electrolytes as indicated  GASTROINTESTINAL A:   Dysphagia, resolved P:  SUP: N/I post extubation Carb-heart diet ordered  HEMATOLOGIC A:   No issues P:  DVT px: LMWH Monitor CBC intermittently Transfuse per usual guidelines  INFECTIOUS A:   No issues P:   Monitor temp, WBC count Micro and abx as above  ENDOCRINE A:   Stress induced hyperglycemia Steroid induced hyperglycemia   P:   Monitor glu on chem panels Consider SSI if glu > 180  NEUROLOGIC A:   Acute encephalopathy due to hypercarbia, resolved P:   RASS goal: 0 Avoid all sedating medications Cont  gabapentin at reduced dose of 300 mg TID  CCM time: 35 mins The above time includes time spent in consultation with patient and/or family members and reviewing care plan on multidisciplinary rounds  Billy Fischeravid Simonds, MD PCCM service Mobile [phone] Pager [phone]  12/02/2015, 3:01 PM

## 2015-12-02 NOTE — Progress Notes (Signed)
[name] was extubated this morning and placed on nasal cannula. O2 sats stay between 88-90%. He tolerated extubation fine. PO Meds held today. Passed nursing swallow eval and started on a diet.  VSS

## 2015-12-02 NOTE — Progress Notes (Signed)
[facility] - [place] at [facility]   PATIENT NAME: Dustin [name]    MR#:  [id number]  DATE OF BIRTH:  04-11-1956  SUBJECTIVE:   Patient extubated this morning. Shortness of breath improved. Positive cough but nonproductive. No complaints presently.  REVIEW OF SYSTEMS:   Review of Systems  Constitutional: Negative for fever and chills.  HENT: Negative for congestion and tinnitus.   Eyes: Negative for blurred vision and double vision.  Respiratory: Positive for cough and shortness of breath. Negative for wheezing.   Cardiovascular: Negative for chest pain, orthopnea and PND.  Gastrointestinal: Negative for nausea, vomiting, abdominal pain and diarrhea.  Genitourinary: Negative for dysuria and hematuria.  Neurological: Negative for dizziness, sensory change and focal weakness.  All other systems reviewed and are negative.    DRUG ALLERGIES:   Allergies  Allergen Reactions  . Asa [Aspirin] Other (See Comments)    Reaction: swelling of the right side.  . Codeine Other (See Comments)    Reaction: Difficulty breathing  . Ibuprofen Other (See Comments)    Reaction: Swelling of the right side.    VITALS:  Blood pressure 96/53, pulse 107, temperature 99 F (37.2 C), temperature source Oral, resp. rate 14, height  (1.778 m), weight 80.8 kg (178 lb 2.1 oz), SpO2 88 %.  PHYSICAL EXAMINATION:   VITAL SIGNS: Filed Vitals:   12/02/15 1400 12/02/15 1500  BP: 115/76 96/53  Pulse: 93 107  Temp:    Resp: 20 14   GENERAL:60 y.o.male sitting up in bed in NAD.   HEAD: Normocephalic, atraumatic.  EYES: Pupils equal, round, reactive to light. EOMI. No scleral icterus.  MOUTH: Moist mucosal membrane. Dentition intact. No abscess noted.  EAR, NOSE, THROAT: Clear without exudates. No external lesions.  NECK: Supple. No thyromegaly. No nodules. No JVD.  PULMONARY: Coarse breath sounds, without wheeze rails or rhonci. No use of accessory muscles, poor  respiratory effort. Prolonged insp. & exp. Phase.  CHEST: Nontender to palpation.  CARDIOVASCULAR: S1 and S2. Tachycardic. No murmurs, rubs, or gallops. No edema. Pedal pulses 2+ bilaterally.  GASTROINTESTINAL: Soft, nontender, nondistended. No masses. Positive bowel sounds. No hepatosplenomegaly.  MUSCULOSKELETAL: No swelling, clubbing, or edema. Range of motion full in all extremities.  NEUROLOGIC: No focal motor or sensory deficits appreciate b/l.  SKIN: No ulceration, lesions, rashes, or cyanosis. Skin warm and dry. Turgor intact.  PSYCHIATRIC: Unable to assess given mental status/medical condition     LABORATORY PANEL:   CBC  Recent Labs Lab 12/02/15 0446  WBC 9.4  HGB 12.5*  HCT 37.8*  PLT 113*   ------------------------------------------------------------------------------------------------------------------  Chemistries   Recent Labs Lab 12/02/15 0446  NA 138  K 3.9  CL 98*  CO2 35*  GLUCOSE 133*  BUN 29*  CREATININE 1.07  CALCIUM 9.0   ------------------------------------------------------------------------------------------------------------------  Cardiac Enzymes  Recent Labs Lab 12/01/15 1145  TROPONINI 0.19*   ------------------------------------------------------------------------------------------------------------------  RADIOLOGY:  Dg Abd 1 View  11/30/2015  CLINICAL DATA:  NG placement EXAM: ABDOMEN - 1 VIEW COMPARISON:  None. FINDINGS: Nasogastric tube appears adequately positioned in the stomach with tip directed towards the stomach fundus. At least mildly distended gas-filled loops of bowel are noted within the adjacent midline abdomen, incompletely imaged. No evidence of free intraperitoneal air. Surgical clips noted within the left upper quadrant. IMPRESSION: Nasogastric tube appears adequately positioned in the stomach with tip directed towards the gastric fundus. Electronically Signed   By: Bary [name] M.D.   On: 11/30/2015 16:55  Dg  Chest Port 1 View  12/01/2015  CLINICAL DATA:  Respiratory failure, history of emphysema EXAM: PORTABLE CHEST 1 VIEW COMPARISON:  Portable chest x-ray of 11/30/2015 FINDINGS: The tip of the endotracheal tube is approximately 5.6 cm above the carina. There is little change in linear opacities at both lung bases most consistent with bibasilar linear atelectasis. No pleural effusion is seen. Cardiomegaly is stable. NG tube extends below the hemidiaphragm. IMPRESSION: 1. Persistent bibasilar linear atelectasis. 2. Endotracheal tube tip approximately 5.6 cm above the carina. Electronically Signed   By: Dwyane DeePaul  Barry M.D.   On: 12/01/2015 08:06   Dg Chest Port 1 View  11/30/2015  CLINICAL DATA:  Post intubation EXAM: PORTABLE CHEST 1 VIEW COMPARISON:  Chest x-ray dated 11/30/2015. FINDINGS: Endotracheal tube appears well positioned with tip approximately 5 cm above the carina. Cardiomediastinal silhouette is stable in size and configuration. Lungs are hyperexpanded indicating COPD. Chronic scarring/fibrosis again bilaterally, bibasilar predominant. No pleural effusions seen. No pneumothorax seen. IMPRESSION: 1. Endotracheal tube adequately positioned with tip approximately 5 cm above the carina. 2. No other interval change. Cardiomegaly. COPD. Chronic scarring/fibrosis within each lung. Electronically Signed   By: Bary RichardStan  Maynard M.D.   On: 11/30/2015 16:58    ASSESSMENT AND PLAN:   60 year old Caucasian gentleman who COPD chronic oxygen requiring admitted 11/30/2015 after results of chest pain  1. Acute on chronic respiratory failure with hypercapnia: Patient extubated this morning as per pulmonary. -Doing clinically well. Continue IV Solu-Medrol, DuoNeb scheduled, Pulmicort nebs.   2. Elevated troponin: Likely secondary to demand ischemia as troponins have trended down. Currently chest pain-free.  3. Tachycardia-sinus tachycardia due to underlying COPD and respiratory distress. -Appreciate cardiology input.  Continue Cardizem.  4. Depression unspecified - continue Lexapro  5. Neuropathy-continue gabapentin.  Likely transfer to floor tomorrow and start physical therapy if resp. Status remains stable.   All the records are reviewed and case discussed with Care Management/Social Workerr. Management plans discussed with the patient, family and they are in agreement.  CODE STATUS: Full  DVT prophylaxis- Lovenox  TOTAL TIME TAKING CARE OF THIS PATIENT: 25 minutes  POSSIBLE D/C IN 2-3 DAYS, DEPENDING ON CLINICAL CONDITION.   [name],Jo-Anne Kluth J M.D on 12/02/2015 at 3:19 PM  Between 7am to 6pm - Pager - [phone]  After 6pm: House Pager: - [phone]  [name]  Office  [phone]  CC: Primary care physician; Lyndon CodeKHAN, FOZIA M, MD

## 2015-12-02 NOTE — Progress Notes (Signed)
Hospital Problem List     Active Problems:   Chest pain with low risk of acute coronary syndrome   Chronic diastolic heart failure (HCC)   Sinus tachycardia seen on cardiac monitor   Pulmonary hypertension (HCC)   Centrilobular emphysema (HCC)   Lethargy   Hypercapnic respiratory failure (HCC)   Acute respiratory failure (HCC)   Acute on chronic respiratory failure with hypercapnia (HCC)   Endotracheally intubated    Patient Profile:   Primary Cardiologist: Dr. Kirke Corin  60 y.o. male w/ PMH of chronic diastolic CHF (EF 16-10% by echo in 07/2013), COPD (on chronic O2), GERD, and tobacco abuse who presented to [facility] on 11/30/2015 for tachycardia and hypotension occurring while at PT earlier that day.  Subjective   Able to be extubated this morning. Currently having a productive cough with yellow phlegm. Denies any chest pain or palpitations.  Inpatient Medications    . budesonide (PULMICORT) nebulizer solution  0.25 mg Nebulization Q6H WA  . diltiazem  30 mg Oral 4 times per day  . enoxaparin (LOVENOX) injection  40 mg Subcutaneous Q24H  . [START ON 12/03/2015] escitalopram  20 mg Oral Daily  . gabapentin  300 mg Oral TID  . Influenza vac split quadrivalent PF  0.5 mL Intramuscular Tomorrow-1000  . ipratropium-albuterol  3 mL Nebulization Q6H  . methylPREDNISolone (SOLU-MEDROL) injection  40 mg Intravenous Q12H  . sodium chloride flush  3 mL Intravenous Q12H    Vital Signs    Filed Vitals:   12/02/15 0632 12/02/15 0700 12/02/15 0800 12/02/15 0900  BP: 106/66 108/65 110/67 108/58  Pulse:  75 75 85  Temp:   99 F (37.2 C)   TempSrc:   Oral   Resp:  Height:      Weight:      SpO2:  91% 92% 92%    Intake/Output Summary (Last 24 hours) at 12/02/15 1038 Last data filed at 12/02/15 0800  Gross per 24 hour  Intake 1911.75 ml  Output   1650 ml  Net 261.75 ml   Filed Weights   11/30/15 0512 12/01/15 0400 12/02/15 0300  Weight: 185 lb 14.4 oz (84.324 kg) 190  lb 14.7 oz (86.6 kg) 178 lb 2.1 oz (80.8 kg)    Physical Exam    General: Elderly-appearing Caucasian male, currently in no acute distress. Head: Normocephalic, atraumatic.  Neck: Supple without bruits, JVD not elevated. Lungs:  Resp regular and unlabored, decreased breath sounds at the bases, expiratory wheezing noted. Heart: RRR, S1, S2, no S3, S4, or murmur; no rub. Abdomen: Soft, non-tender, non-distended with normoactive bowel sounds. No hepatomegaly. No rebound/guarding. No obvious abdominal masses. Extremities: No clubbing, cyanosis, or edema. Distal pedal pulses are 2+ bilaterally. Neuro: Alert and oriented X 3. Moves all extremities spontaneously. Psych: Normal affect.  Labs    CBC  Recent Labs  11/30/15 0102 12/01/15 0559 12/02/15 0446  WBC 7.3 5.9 9.4  NEUTROABS 4.8  --   --   HGB 13.2 12.7* 12.5*  HCT 39.9* 39.2* 37.8*  MCV 96.9 97.2 98.1  PLT 173 117* 113*   Basic Metabolic Panel  Recent Labs  12/01/15 0559 12/02/15 0446  NA 139 138  K 4.6 3.9  CL 101 98*  CO2 35* 35*  GLUCOSE 126* 133*  BUN 16 29*  CREATININE 1.03 1.07  CALCIUM 8.6* 9.0   Liver Function Tests No results for input(s): AST, ALT, ALKPHOS, BILITOT, PROT, ALBUMIN in the last 72 hours. No  results for input(s): LIPASE, AMYLASE in the last 72 hours. Cardiac Enzymes  Recent Labs  11/30/15 0533 12/01/15 0559 12/01/15 1145  TROPONINI <0.03 0.27* 0.19*   BNP Invalid input(s): POCBNP D-Dimer No results for input(s): DDIMER in the last 72 hours. Hemoglobin A1C  Recent Labs  11/30/15 0533  HGBA1C 5.3   Fasting Lipid Panel  Recent Labs  11/30/15 1615  TRIG 61   Thyroid Function Tests  Recent Labs  11/30/15 0102  TSH 1.790    Telemetry    NSR, HR in high-70's - 90's.  ECG    NSR, HR 86, RBBB, nonspecific ST changes in V4, V5, and V6.   Cardiac Studies and Radiology    Dg Abd 1 View: 11/30/2015  CLINICAL DATA:  NG placement EXAM: ABDOMEN - 1 VIEW COMPARISON:   None. FINDINGS: Nasogastric tube appears adequately positioned in the stomach with tip directed towards the stomach fundus. At least mildly distended gas-filled loops of bowel are noted within the adjacent midline abdomen, incompletely imaged. No evidence of free intraperitoneal air. Surgical clips noted within the left upper quadrant. IMPRESSION: Nasogastric tube appears adequately positioned in the stomach with tip directed towards the gastric fundus. Electronically Signed   By: Bary RichardStan  Maynard M.D.   On: 11/30/2015 16:55   Ct Angio Chest Pe W/cm &/or Wo Cm: 11/30/2015  CLINICAL DATA:  Chest pain, dyspnea, tachypnea EXAM: CT ANGIOGRAPHY CHEST WITH CONTRAST TECHNIQUE: Multidetector CT imaging of the chest was performed using the standard protocol during bolus administration of intravenous contrast. Multiplanar CT image reconstructions and MIPs were obtained to evaluate the vascular anatomy. CONTRAST:  100mL OMNIPAQUE IOHEXOL 350 MG/ML SOLN COMPARISON:  08/18/2015 FINDINGS: THORACIC INLET/BODY WALL: No acute abnormality. MEDIASTINUM: Normal heart size. Chronic anterior low-density pericardial effusion measuring up to 16 mm thickness. No evidence of pulmonary embolism. No indication of acute aortic syndrome. LUNG WINDOWS: There is no edema, consolidation, effusion, or pneumothorax. Panlobular emphysema. Stable 9 mm pulmonary nodule in the right lateral costophrenic sulcus. This nodule is followed in a lung cancer screening program. Subsegmental basilar atelectasis. UPPER ABDOMEN: No acute findings.  Gastrosplenic ligament surgical clips. OSSEOUS: No acute fracture.  No suspicious lytic or blastic lesions. Review of the MIP images confirms the above findings. IMPRESSION: 1. No evidence of pulmonary embolism or other acute finding. 2. Chronic pericardial effusion. 3. Emphysema. Electronically Signed   By: Marnee SpringJonathon  Watts M.D.   On: 11/30/2015 03:13   Dg Chest Port 1 View: 12/01/2015  CLINICAL DATA:  Respiratory  failure, history of emphysema EXAM: PORTABLE CHEST 1 VIEW COMPARISON:  Portable chest x-ray of 11/30/2015 FINDINGS: The tip of the endotracheal tube is approximately 5.6 cm above the carina. There is little change in linear opacities at both lung bases most consistent with bibasilar linear atelectasis. No pleural effusion is seen. Cardiomegaly is stable. NG tube extends below the hemidiaphragm. IMPRESSION: 1. Persistent bibasilar linear atelectasis. 2. Endotracheal tube tip approximately 5.6 cm above the carina. Electronically Signed   By: Dwyane DeePaul  Barry M.D.   On: 12/01/2015 08:06    Echocardiogram: 11/30/2015 Study Conclusions - Left ventricle: The cavity size was normal. Systolic function was  normal. The estimated ejection fraction was in the range of 60%  to 65%. Wall motion was normal; there were no regional wall  motion abnormalities. Doppler parameters are consistent with  abnormal left ventricular relaxation (grade 1 diastolic  dysfunction). - Right ventricle: The cavity size was mildly dilated. Wall  thickness was normal. Systolic function was normal. -  Pulmonary arteries: Systolic pressure was moderately to severely  increased. - Inferior vena cava: The vessel was dilated. The respirophasic  diameter changes were blunted (< 50%), consistent with elevated  central venous pressure. - Pericardium, extracardiac: A trivial pericardial effusion was  identified.  Impressions: - Sinus tachycardia noted.  Assessment & Plan    1. Chest Pain/ Abdominal Pain - presented with chest pain occurring at rest and lasting for several hours, which is overall atypical for ACS. On 11/30/2015, he denied any chest pain but does report having LUQ pain, nausea, and a decreased appetite.  - EKG shows sinus tachycardia with PVC's and RBBB (incomplete RBBB noted on previous tracings). BMP showed stable creatinine of 1.00. No significant electrolyte abnormalities.  - Initial two troponin values  negative. Repeat echocardiogram shows EF of 60-65% with no wall motion abnormalities. - His CTA was personally reviewed by Dr. Mariah Milling and did not show any significant coronary calcifications. - consider obtaining an outpatient Lexiscan Myoview. Would investigate other etiologies of his presenting symptoms and tachycardia.  2. Sinus Tachycardia - CTA checked on admission with no evidence of a pulmonary embolism. A chronic pericardial effusion was identified. - TSH and BNP normal. No significant electrolyte abnormalities noted on his lab results. - Improved over the past 24 hours, even with Cardizem being frequently held in the setting of hypotension. Continue PO Cardizem  Q6H with hold parameters in place. Can possibly be d/c'ed tomorrow if HR remains stable.  3. Chronic Diastolic CHF - EF 60-65% by echo in 07/2013. BNP normal at 24.  - Repeat echo this admission shows EF of 60-65%. - continue PO Cardizem as above. Would avoid BB in setting of COPD. No ACE-I currently due to hypotension.  4. COPD - on chronic O2 at home - per admitting team  5. Elevated Pulmonary Pressure - echo showed moderate to severely elevated pulmonary pressures. - would not administer IV Lasix at this time due to soft BP. - will need to be followed as outpatient.  6. Acute on Chronic Respiratory Failure with Hypercapnia - was more lethargic on 11/30/2015, CO2 at 127. - required transfer to the ICU with intubation. Extubated on 12/02/2015. Now having a productive cough. - per admitting team.  7. Elevated Troponin - initial two troponin values were negative this admission. Are being cycled again following his acute respiratory failure with initial value being 0.27 and subsequent value trending down to 0.19. - IV Heparin has been discontinued.  - denies any current anginal symptoms.   Lorri Frederick , PA-C 10:38 AM 12/02/2015 Pager: [phone]   Attending Note Patient seen and examined,  agree with detailed note above,  Patient presentation and plan discussed on rounds.   Mr. Friedhoff reports feeling dramatically better today, especially in the past 24 hours Still with significant cough, sputum production Spitting large amounts of thick mucus into a bag Denies any chest pain Extubated earlier today  Echocardiogram with normal LV function, moderate to severely elevated right heart pressures In the setting of COPD  Suspect his presentation is likely COPD exacerbation, secondary acute on chronic diastolic CHF He did receive several doses of Lasix, renal function stable Lasix currently on hold  --If he has worsening shortness of breath, with normal renal function, could treat again with IV Lasix Otherwise would continue to benefit from nebulizers, antibiotics, pulmonary support     Signed: Dossie Arbour  M.D., Ph.D. [facility]

## 2015-12-02 NOTE — Progress Notes (Signed)
Placed patient in SBT 20/5 30%.  Will continue to monitor.

## 2015-12-03 MED ORDER — DOCUSATE SODIUM 100 MG PO CAPS: 100.0000 mg | ORAL_CAPSULE | Freq: Two times a day (BID) | ORAL | Status: DC | PRN

## 2015-12-03 MED ORDER — SENNOSIDES-DOCUSATE SODIUM 8.6-50 MG PO TABS: 1.0000 | ORAL_TABLET | Freq: Two times a day (BID) | ORAL | Status: DC | PRN

## 2015-12-03 MED ORDER — ALPRAZOLAM 0.5 MG PO TABS
1.0000 mg | ORAL_TABLET | Freq: Once | ORAL | Status: AC
  Administered 2015-12-03: 1 mg via ORAL
  Filled 2015-12-03: qty 2

## 2015-12-03 MED ORDER — PREDNISONE 20 MG PO TABS
60.0000 mg | ORAL_TABLET | Freq: Every day | ORAL | Status: AC
  Administered 2015-12-03 – 2015-12-05 (×3): 60 mg via ORAL
  Filled 2015-12-03 (×4): qty 3

## 2015-12-03 NOTE — Progress Notes (Addendum)
CSW received a consult stating that patient is in need of assistance with medication. CSW informed RN Case Manager of above. CSW is awaiting PT Evaluation to be completed to determine patient's needs at discharge and level of care. CSW will continue to follow and assist.   Woodroe Modehristina Tracye Szuch, MSW, [name]-A Clinical Social Work Department [phone]

## 2015-12-03 NOTE — Evaluation (Signed)
Physical Therapy Evaluation Patient Details Name: Dustin [name] MRN: [id number] DOB: [birth year] Today's Date: 12/03/2015   History of Present Illness  presented to ER secondary to chest pain; admitted to rule out MI (elevation in troponin noted, now attributed to demand ischemia).  Hospital course complicated by acute hypercapnic respiratory failure requiring transfer to CCU and intubation 3/14-3/16; currently on 5L supplemental O2 via [place].  Clinical Impression  Upon evaluation, patient alert and oriented; follows all commands and demonstrates good insight/safety awareness.  Bilat UE/LE strength and ROM grossly at least 3+/5 throughout (not tested with resistance secondary to respiratory status); denies pain at this time.  Able to complete bed mobility and supine/sit with mod indep; however, with transition to upright, desats to 84-85% despite O2 at 6L. Unable to recover despite pursed lip breathing and rest periods. Required return to supine with extended recovery time for improvement to 88-89%.  Will continue mobility assessment as medically appropriate.  RN informed/aware of performance; to continue to monitor saturation levels with extended rest period. Would benefit from skilled PT to address above deficits and promote optimal return to PLOF; recommend transition to STR upon discharge from acute hospitalization given significant limitations in activity tolerance and cardiopulmonary endurance.  May improve to HHPT pending improvement in medical condition and additional mobility assessment.     Follow Up Recommendations SNF (may improve to HHPT pending additional mobility assessment as medically appropriate)    Equipment Recommendations       Recommendations for Other Services       Precautions / Restrictions Precautions Precautions: Fall Restrictions Weight Bearing Restrictions: No      Mobility  Bed Mobility Overal bed mobility: Modified Independent                 Transfers                 General transfer comment: unsafe/unable to tolerate secondary to SOB, desat with transition to upright  Ambulation/Gait             General Gait Details: unsafe/unable to tolerate secondary to SOB, desat with transition to upright  Stairs            Wheelchair Mobility    Modified Rankin (Stroke Patients Only)       Balance   Sitting-balance support: No upper extremity supported;Feet supported Sitting balance-Leahy Scale: Normal                                       Pertinent Vitals/Pain Pain Assessment: No/denies pain    Home Living Family/patient expects to be discharged to:: Private residence Living Arrangements: Spouse/significant other;Children Available Help at Discharge: Family Type of Home: House Home Access: Stairs to enter Entrance Stairs-Rails: None Entrance Stairs-Number of Steps: 4 Home Layout: One level        Prior Function Level of Independence: Independent         Comments: Indep with all ADLs and household activities; denies fall history.  Home O2 at 2L     Hand Dominance        Extremity/Trunk Assessment   Upper Extremity Assessment:  (grossly at least 3+/5; not tested with resistance secondary to respiratory status)           Lower Extremity Assessment:  (grossly at least 3+/5; not tested with resistance secondary to respiratory status)         Communication  Communication: No difficulties  Cognition Arousal/Alertness: Awake/alert Behavior During Therapy: WFL for tasks assessed/performed Overall Cognitive Status: Within Functional Limits for tasks assessed                      General Comments      Exercises        Assessment/Plan    PT Assessment Patient needs continued PT services  PT Diagnosis Difficulty walking;Generalized weakness   PT Problem List Decreased strength;Decreased activity tolerance;Decreased balance;Decreased  mobility;Decreased knowledge of use of DME;Decreased safety awareness;Decreased knowledge of precautions;Cardiopulmonary status limiting activity  PT Treatment Interventions DME instruction;Gait training;Stair training;Functional mobility training;Therapeutic activities;Therapeutic exercise;Balance training;Patient/family education   PT Goals (Current goals can be found in the Care Plan section) Acute Rehab PT Goals Patient Stated Goal: to get back home PT Goal Formulation: With patient Time For Goal Achievement: 12/17/15 Potential to Achieve Goals: Good    Frequency Min 2X/week   Barriers to discharge        Co-evaluation               End of Session Equipment Utilized During Treatment: Oxygen Activity Tolerance: Patient limited by fatigue;Treatment limited secondary to medical complications (Comment) (unable to maintain O2 sats with transition to upright) Patient left: in bed;with call bell/phone within reach;with bed alarm set           Time: [id number] PT Time Calculation (min) (ACUTE ONLY): 21 min   Charges:   PT Evaluation $PT Eval Moderate Complexity: 1 Procedure     PT G Codes:        Bralynn Donado H. Manson PasseyBrown, PT, DPT, NCS 12/03/2015, 5:17 PM [phone]

## 2015-12-03 NOTE — Progress Notes (Signed)
Nutrition Follow-up    INTERVENTION:   Meals and Snacks: Cater to patient preferences; recommend removal of diabetic diet as pt with poor po intake and no prior hx of DM, pt likes Strawberry Yogurt, will send BID as snack Medical Food Supplement Therapy: pt agreeable to supplement, would like to try Magic Cup, will send BID  NUTRITION DIAGNOSIS:   Inadequate oral intake related to acute illness as evidenced by NPO status.  GOAL:   Patient will meet greater than or equal to 90% of their needs, Provide needs based on ASPEN/SCCM guidelines  MONITOR:   TF tolerance, Vent status, I & O's, Labs, Weight trends, Skin  REASON FOR ASSESSMENT:   Malnutrition Screening Tool    ASSESSMENT:    Pt s/p extubation, alert on visit today, off Bipap, 6L [place]   Diet Order:  Diet Heart Room service appropriate?: Yes; Fluid consistency:: Thin   Energy Intake: no recorded po intake, pt ate bites of eggs with sips of milk and coffee this AM; reports poor appetite  Food and Nutrition Related History: pt reports good appetite prior to admission   Recent Labs Lab 11/30/15 0102 12/01/15 0559 12/02/15 0446  NA 141 139 138  K 3.9 4.6 3.9  CL 103 101 98*  CO2 32 35* 35*  BUN 18 16 29*  CREATININE 1.00 1.03 1.07  CALCIUM 8.9 8.6* 9.0  GLUCOSE 160* 126* 133*    Glucose Profile:  Recent Labs  12/02/15 1147 12/02/15 1626 12/02/15 1959  GLUCAP 102* 99 118*   Meds: prednisone  Height:   Ht Readings from Last 1 Encounters:  12/01/15 5\' 10"  (1.778 m)    Weight: pt reports 20-25 pound wt loss in past year; ~11% wt loss in 1 year  Wt Readings from Last 1 Encounters:  12/03/15 181 lb 14.1 oz (82.5 kg)    Filed Weights   12/01/15 0400 12/02/15 0300 12/03/15 0658  Weight: 190 lb 14.7 oz (86.6 kg) 178 lb 2.1 oz (80.8 kg) 181 lb 14.1 oz (82.5 kg)    BMI:  Body mass index is 26.1 kg/(m^2).  Estimated Nutritional Needs:   Kcal:  2046 kcals (BEE 1672, Ve: 8.8, Tmax: 38.2) using wt of  86.6 kg  Protein:  126-168 g (1.5-2.0 g/kg)   Fluid:  2100-2520 mL (25-30 ml/kg)   EDUCATION NEEDS:   Education needs no appropriate at this time  MODERATE Care Level  Romelle StarcherCate Vollie Brunty MS, RD, LDN [phone] Pager  [phone] Weekend/On-Call Pager

## 2015-12-03 NOTE — Progress Notes (Signed)
PULMONARY / CRITICAL CARE MEDICINE   Name: Dustin [name] MRN: [id number] DOB: [birth year]    ADMISSION DATE:  11/30/2015 CONSULTATION DATE:  11/30/15  REFERRING MD: [facility]  PT PROFILE:   61 M smoker with severe COPD/emphysema admitted early AM 03/14 with CP to R/O MI and transferred to ICU/SDU with severe hypercarbic respiratory failure. Failed BiPAP and was intubated. Now extubated.   MAJOR EVENTS/TEST RESULTS: 03/14 Intubated after failed BiPAP with persistent severe hypercarbia 03/14 TTE: LVEF 60-65%. No RWMAs. Grade I diastolic dysfunction 03/16 Extubated  INDWELLING DEVICES:: ETT 03/14 >> 03/16  MICRO DATA:  MRSA PCR 03/14 >> NEG  ANTIMICROBIALS:   SUBJECTIVE:  No acute issues overnight. Pleasant and a fast no complaints. Tolerated BiPAP overnight. Voiding normally post Foley  VITAL SIGNS: BP 139/78 mmHg  Pulse 103  Temp(Src) 98.3 F (36.8 C) (Oral)  Resp 28  Ht  (1.778 m)  Wt 181 lb 14.1 oz (82.5 kg)  BMI 26.10 kg/m2  SpO2 86%  HEMODYNAMICS:    VENTILATOR SETTINGS: Vent Mode:  [-]  FiO2 (%):  [36 %] 36 %  INTAKE / OUTPUT: I/O last 3 completed shifts: In: 1298.4 [I.V.:433.4; NG/GT:865] Out: 4200 [Urine:4200]  PHYSICAL EXAMINATION: General: NAD Neuro: CNs intact, MAEs, DTRs symmetric HEENT: NCAT Cardiovascular: NSR, S1/S2, no MRG Lungs: very distant BS, no wheezes  Abdomen: soft, NT, +BS Ext: warm, no edema Skin: No lesions or rash  LABS:  BMET  Recent Labs Lab 11/30/15 0102 12/01/15 0559 12/02/15 0446  NA 141 139 138  K 3.9 4.6 3.9  CL 103 101 98*  CO2 32 35* 35*  BUN 18 16 29*  CREATININE 1.00 1.03 1.07  GLUCOSE 160* 126* 133*    Electrolytes  Recent Labs Lab 11/30/15 0102 12/01/15 0559 12/02/15 0446  CALCIUM 8.9 8.6* 9.0    CBC  Recent Labs Lab 11/30/15 0102 12/01/15 0559 12/02/15 0446  WBC 7.3 5.9 9.4  HGB 13.2 12.7* 12.5*  HCT 39.9* 39.2* 37.8*  PLT 173 117* 113*    Coag's  Recent Labs Lab  12/01/15 0810  APTT 28  INR 1.12    Sepsis Markers No results for input(s): LATICACIDVEN, PROCALCITON, O2SATVEN in the last 168 hours.  ABG  Recent Labs Lab 11/30/15 1510 11/30/15 1922 11/30/15 2057  PHART 7.14* 7.24* 7.32*  PCO2ART 127* 90* 81*  PO2ART 113* 232* 123*    Liver Enzymes No results for input(s): AST, ALT, ALKPHOS, BILITOT, ALBUMIN in the last 168 hours.  Cardiac Enzymes  Recent Labs Lab 11/30/15 0533 12/01/15 0559 12/01/15 1145  TROPONINI <0.03 0.27* 0.19*    Glucose  Recent Labs Lab 12/01/15 2352 12/02/15 0313 12/02/15 0730 12/02/15 1147 12/02/15 1626 12/02/15 1959  GLUCAP 134* 122* 136* 102* 99 118*    CXR: NNF   ASSESSMENT / PLAN:  PULMONARY A: Acute on chronic hypercarbic respiratory failure Severe emphysema, O2 dependent AECOPD Smoker P:   Supplemental O2 to maintain SpO2 88-94% Cont nebulized steroids Cont nebulized BDs D/C IV steroids and start prednisone 60 mg daily PRN BiPAP for respiratory distress, declining LOC or refractory hypoxemia Mandatory nocturnal BiPAP for chronic hypercarbic failure. Patient will need outpatient pulmonology follow-up  CARDIOVASCULAR A:  Chest pain  Minimally elevated trop I-2-D echo shows left ventricular ejection fraction 60-65%, grade 1 diastolic dysfunction ASA allergy Chronic diastolic heart failure-currently euvolemic Sinus tachycardia P:  Monitor Cards consult noted  Weigh daily Diltiazem discontinued Avoid beta blocker Hold diuresis secondary to hypotension. We'll consider gentle diuresis if any  signs of volume of value  RENAL A:   No issues P:   Monitor BMET intermittently Monitor I/Os Correct electrolytes as indicated  GASTROINTESTINAL A:   Dysphagia, resolved P:   SUP: N/I post extubation Carb-heart diet ordered  HEMATOLOGIC A:   No issues P:  DVT px: LMWH Monitor CBC intermittently Transfuse per usual guidelines  INFECTIOUS A:   No issues P:    Monitor temp and WBC count Follow-up cultures  ENDOCRINE A:   Stress induced hyperglycemia Steroid induced hyperglycemia   P:   Monitor glucose on chem panels Consider SSI if glu > 180  NEUROLOGIC A:   Acute encephalopathy due to hypercarbia, resolved P:   RASS goal: 0 Avoid all sedating medications Cont gabapentin at reduced dose of 300 mg TID  Disposition and family update: No family at bedside. Patient is medically stable for transfer out of the ICU. Transfer to Med-surg if primary team agrees.   Best Practice: Code Status:  Full. Diet: Heart Healthy / Carb Mod. GI prophylaxis:  PPI. VTE prophylaxis:  SCD's / LOVENOX.  Magdalene S. [facility] Center[facility] ANP-BC Pulmonary and Critical Care Medicine [facility] Pager [phone]  12/03/2015, 9:15 AM   PCCM ATTENDING ATTESTATION:  I have evaluated patient with ANP Luci Bankukov, reviewed database in its entirety and discussed care plan in detail. In addition, this patient was discussed on multidisciplinary rounds. He has tolerated extubation well and is ready for transfer to gen med floor. Continue O2, nebulized steroids, nebulized bronchodilators. Change steroids to PO and taper to off over next 5-7 days. Dr Dema SeverinMungal is on over WE and will see. I have asked my office to schedule follow up with me in 4-6 weeks   Billy Fischeravid Jovonta Levit, MD PCCM service Mobile [phone] Pager [phone]

## 2015-12-03 NOTE — Care Management Note (Addendum)
Case Management Note  Patient Details  Name: Dustin [name] MRN: [id number] Date of Birth: [birth year]  Subjective/Objective:                  Medication assistance requested for this patient. He has Medicare UHC which limits RNCM ability to assist patient. He states his pulmonary Rx (Spiriva) costs too much and that [facility] been [facility] to assist him. I have made referral to [facility] pharmacy to assist with Spiriva. Patient is on chronic O2 through [facility]. He was at [facility] last September and agrees to going to SNF just not [facility]. He lives with his wife, daughter and two grandchildren. His PCP is Dr. Beverely RisenFozia Khan. This patient would be appropriate for COPD Gold as he has had frequent office visits and a few hospital admissions over last 6 months.   Action/Plan: I have [name] of [facility]. RNCM will continue to follow.    Expected Discharge Date:                  Expected Discharge Plan:     In-House Referral:     Discharge planning Services  Medication Assistance, CM Consult  Post Acute Care Choice:    Choice offered to:  Patient  DME Arranged:    DME Agency:     HH Arranged:    HH Agency:  [facility]  Status of Service:  In process, will continue to follow  Medicare Important Message Given:    Date Medicare IM Given:    Medicare IM give by:    Date Additional Medicare IM Given:    Additional Medicare Important Message give by:     If discussed at Long Length of Stay Meetings, dates discussed:    Additional Comments:  Collie Siadngela Necola Bluestein, RN 12/03/2015, 4:13 PM

## 2015-12-03 NOTE — Progress Notes (Signed)
Patient has remained alert and oriented throughout the night.  Vital signs have remained stable and urine output has been adequate.  Foley was removed around 0645 and the patient was given a urinal.  Patient tolerated BiPAP well throughout the night and is now on 6L [place].  Will continue to monitor.

## 2015-12-03 NOTE — Progress Notes (Signed)
Pt transferred from ICU.  Belongings with the pt.  Respiratory notified that the pt is requesting the bipap machine and mask be brought down. Pt concerned about home meds.  I verified with pharmacy that they are being stored in pharmacy.  This was relayed to the pt

## 2015-12-03 NOTE — Progress Notes (Signed)
[facility] - [place] at [facility]   PATIENT NAME: Dustin [name]    MR#:  [id number]  DATE OF BIRTH:  Jun 28, 1956  SUBJECTIVE:   Extubated on 12/02/15 and transferred to floor today. Shortness of breath much improved. Positive cough but nonproductive. No complaints presently.  REVIEW OF SYSTEMS:   Review of Systems  Constitutional: Negative for fever and chills.  HENT: Negative for congestion and tinnitus.   Eyes: Negative for blurred vision and double vision.  Respiratory: Positive for cough and shortness of breath. Negative for wheezing.   Cardiovascular: Negative for chest pain, orthopnea and PND.  Gastrointestinal: Negative for nausea, vomiting, abdominal pain and diarrhea.  Genitourinary: Negative for dysuria and hematuria.  Neurological: Negative for dizziness, sensory change and focal weakness.  All other systems reviewed and are negative.    DRUG ALLERGIES:   Allergies  Allergen Reactions  . Asa [Aspirin] Other (See Comments)    Reaction: swelling of the right side.  . Codeine Other (See Comments)    Reaction: Difficulty breathing  . Ibuprofen Other (See Comments)    Reaction: Swelling of the right side.    VITALS:  Blood pressure 141/71, pulse 87, temperature 98.6 F (37 C), temperature source Oral, resp. rate 18, height  (1.778 m), weight 82.5 kg (181 lb 14.1 oz), SpO2 89 %.  PHYSICAL EXAMINATION:   VITAL SIGNS: Filed Vitals:   12/03/15 1100 12/03/15 1150  BP: 140/75 141/71  Pulse: 102 87  Temp:  98.6 F (37 C)  Resp: 19 18   GENERAL:59 y.o.male lying in bed in NAD.   HEAD: Normocephalic, atraumatic.  EYES: Pupils equal, round, reactive to light. EOMI. No scleral icterus.  MOUTH: Moist mucosal membrane. Dentition intact. No abscess noted.  EAR, NOSE, THROAT: Clear without exudates. No external lesions.  NECK: Supple. No thyromegaly. No nodules. No JVD.  PULMONARY: Coarse breath sounds, without wheeze rails or rhonci.  No use of accessory muscles, good air entry bilaterally, prolonged inspiratory and expiratory phase.  CHEST: Nontender to palpation.  CARDIOVASCULAR: S1 and S2. Tachycardic. No murmurs, rubs, or gallops. No edema. Pedal pulses 2+ bilaterally.  GASTROINTESTINAL: Soft, nontender, nondistended. No masses. Positive bowel sounds. No hepatosplenomegaly.  MUSCULOSKELETAL: No swelling, clubbing, or edema. Range of motion full in all extremities.  NEUROLOGIC: No focal motor or sensory deficits appreciate b/l.  SKIN: No ulceration, lesions, rashes, or cyanosis. Skin warm and dry. Turgor intact.  PSYCHIATRIC: Unable to assess given mental status/medical condition     LABORATORY PANEL:   CBC  Recent Labs Lab 12/02/15 0446  WBC 9.4  HGB 12.5*  HCT 37.8*  PLT 113*   ------------------------------------------------------------------------------------------------------------------  Chemistries   Recent Labs Lab 12/02/15 0446  NA 138  K 3.9  CL 98*  CO2 35*  GLUCOSE 133*  BUN 29*  CREATININE 1.07  CALCIUM 9.0   ------------------------------------------------------------------------------------------------------------------  Cardiac Enzymes  Recent Labs Lab 12/01/15 1145  TROPONINI 0.19*   ------------------------------------------------------------------------------------------------------------------  RADIOLOGY:  No results found.  ASSESSMENT AND PLAN:   60 year old Caucasian gentleman who COPD chronic oxygen requiring admitted 11/30/2015 after results of chest pain  1. Acute on chronic respiratory failure with hypercapnia: Patient extubated 3/16 as per pulmonary. -Doing clinically well.  Off IV Steroids and weaned to Oral pred today - cont. DuoNeb scheduled, Pulmicort nebs.   2. Elevated troponin: Likely secondary to demand ischemia as troponins have trended down. Currently chest pain-free.  3. Tachycardia-sinus tachycardia due to underlying COPD and respiratory  distress. - improved  and resolved.   4. Depression unspecified - continue Lexapro  5. Neuropathy-continue gabapentin.  Await PT eval and likely d/c in tomorrow.   All the records are reviewed and case discussed with Care Management/Social Workerr. Management plans discussed with the patient, family and they are in agreement.  CODE STATUS: Full  DVT prophylaxis- Lovenox  TOTAL TIME TAKING CARE OF THIS PATIENT: 25 minutes  POSSIBLE D/C IN 1-2 DAYS, DEPENDING ON CLINICAL CONDITION.   [name],[name] J M.D on 12/03/2015 at 2:23 PM  Between 7am to 6pm - Pager - [phone]  After 6pm: House Pager: - [phone]  [name]  Office  [phone]  CC: Primary care physician; Lyndon CodeKHAN, FOZIA M, MD

## 2015-12-03 NOTE — Progress Notes (Signed)
Pt has not had a BM since 11/30/15.  Order given for senokot and colace

## 2015-12-04 LAB — CULTURE, RESPIRATORY W GRAM STAIN

## 2015-12-04 LAB — CULTURE, RESPIRATORY

## 2015-12-04 LAB — EXPECTORATED SPUTUM ASSESSMENT W GRAM STAIN, RFLX TO RESP C

## 2015-12-04 LAB — EXPECTORATED SPUTUM ASSESSMENT W REFEX TO RESP CULTURE

## 2015-12-04 MED ORDER — ALPRAZOLAM 0.5 MG PO TABS
1.0000 mg | ORAL_TABLET | Freq: Three times a day (TID) | ORAL | Status: DC | PRN
  Administered 2015-12-04 – 2015-12-06 (×5): 1 mg via ORAL
  Filled 2015-12-04 (×5): qty 2

## 2015-12-04 NOTE — Progress Notes (Signed)
Physical Therapy Treatment Patient Details Name: Dustin [name] MRN: [id number] DOB: November 29, 1955 Today's Date: 12/04/2015    History of Present Illness presented to ER secondary to chest pain; admitted to rule out MI (elevation in troponin noted, now attributed to demand ischemia).  Hospital course complicated by acute hypercapnic respiratory failure requiring transfer to CCU and intubation 3/14-3/16; currently on 5L supplemental O2 via [place].    PT Comments    Pt able to perform entire session on 5L/min as received, with improved functional activity tolerance, and improved recovery time. Pt maintains nasal congestion which limits efficacy of nasal inspiration during desat. Desaturation with slow ambulation occurs only after 30 feet, and recovery is achieved in <30s on same flow rate. Pt's biggest limiting factor remains his impaired balance, worse with functional mobility, turns and directional changes. Pt denies any falls history of previous balance impairments.      Follow Up Recommendations  Home health PT     Equipment Recommendations  Rolling walker with 5" wheels    Recommendations for Other Services       Precautions / Restrictions Precautions Precautions: Fall    Mobility  Bed Mobility Overal bed mobility: Modified Independent                Transfers Overall transfer level: Needs assistance Equipment used: 1 person hand held assist Transfers: Sit to/from Stand Sit to Stand: Min assist         General transfer comment: Very unsteady, requires 30sec to acquire balance, which is easily lost with turns and sudden movement.   Ambulation/Gait Ambulation/Gait assistance: Min assist Ambulation Distance (Feet): 45 Feet Assistive device: None   Gait velocity: 0.20m/s Gait velocity interpretation: <1.8 ft/sec, indicative of risk for recurrent falls General Gait Details: rigid trunk, easy LOB with turns x5, requires minA to correct. Performed on 5L, SaO2 ok until  ~47ft.    Stairs            Wheelchair Mobility    Modified Rankin (Stroke Patients Only)       Balance Overall balance assessment: Needs assistance   Sitting balance-Leahy Scale: Good     Standing balance support: During functional activity;No upper extremity supported Standing balance-Leahy Scale: Poor               High level balance activites: Turns;Backward walking;Direction changes;Sudden stops      Cognition Arousal/Alertness: Awake/alert Behavior During Therapy: WFL for tasks assessed/performed Overall Cognitive Status: Within Functional Limits for tasks assessed                      Exercises      General Comments        Pertinent Vitals/Pain Pain Assessment: No/denies pain    Home Living                      Prior Function            PT Goals (current goals can now be found in the care plan section) Acute Rehab PT Goals Patient Stated Goal: to get back home PT Goal Formulation: With patient Time For Goal Achievement: 12/17/15 Potential to Achieve Goals: Good Progress towards PT goals: Progressing toward goals    Frequency  Min 2X/week    PT Plan Discharge plan needs to be updated    Co-evaluation             End of Session Equipment Utilized During Treatment: Oxygen;Gait belt Activity Tolerance:  Treatment limited secondary to medical complications (Comment);Patient tolerated treatment well Patient left: in bed;with call bell/phone within reach;with bed alarm set     Time: [id number] PT Time Calculation (min) (ACUTE ONLY): 11 min  Charges:  $Therapeutic Activity: 8-22 mins                    G Codes:      4:41 PM, 12/04/2015 Rosamaria LintsAllan C Nyimah Shadduck, PT, DPT PRN Physical Therapist - [facility] License # [id number] [phone](ASCOM)  [phone] (mobile)

## 2015-12-04 NOTE — Progress Notes (Signed)
[facility] - [place] at [facility]   PATIENT NAME: Dustin [name]    MR#:  [id number]  DATE OF BIRTH:  [birth year]  SUBJECTIVE:   Extubated on 12/02/15 and transferred to floor. Shortness of breath much improved. Positive cough but nonproductive. No complaints presently.  REVIEW OF SYSTEMS:   Review of Systems  Constitutional: Negative for fever and chills.  HENT: Negative for congestion and tinnitus.   Eyes: Negative for blurred vision and double vision.  Respiratory: Positive for cough and shortness of breath. Negative for wheezing.   Cardiovascular: Negative for chest pain, orthopnea and PND.  Gastrointestinal: Negative for nausea, vomiting, abdominal pain and diarrhea.  Genitourinary: Negative for dysuria and hematuria.  Neurological: Negative for dizziness, sensory change and focal weakness.  All other systems reviewed and are negative.    DRUG ALLERGIES:   Allergies  Allergen Reactions  . Asa [Aspirin] Other (See Comments)    Reaction: swelling of the right side.  . Codeine Other (See Comments)    Reaction: Difficulty breathing  . Ibuprofen Other (See Comments)    Reaction: Swelling of the right side.    VITALS:  Blood pressure 108/67, pulse 75, temperature 99.3 F (37.4 C), temperature source Axillary, resp. rate 18, height  (1.778 m), weight 83.734 kg (184 lb 9.6 oz), SpO2 97 %.  PHYSICAL EXAMINATION:   VITAL SIGNS: Filed Vitals:   12/04/15 0719 12/04/15 1447  BP: 115/65 108/67  Pulse: 68 75  Temp: 97.9 F (36.6 C) 99.3 F (37.4 C)  Resp: 18 18   GENERAL:59 y.o.male lying in bed in NAD.   HEAD: Normocephalic, atraumatic.  EYES: Pupils equal, round, reactive to light. EOMI. No scleral icterus.  MOUTH: Moist mucosal membrane. Dentition intact. No abscess noted.  EAR, NOSE, THROAT: Clear without exudates. No external lesions.  NECK: Supple. No thyromegaly. No nodules. No JVD.  PULMONARY: Coarse breath sounds, without  wheeze rails or rhonci. No use of accessory muscles, good air entry bilaterally, prolonged inspiratory and expiratory phase.  CHEST: Nontender to palpation.  CARDIOVASCULAR: S1 and S2. Tachycardic. No murmurs, rubs, or gallops. No edema. Pedal pulses 2+ bilaterally.  GASTROINTESTINAL: Soft, nontender, nondistended. No masses. Positive bowel sounds. No hepatosplenomegaly.  MUSCULOSKELETAL: No swelling, clubbing, or edema. Range of motion full in all extremities.  NEUROLOGIC: No focal motor or sensory deficits appreciate b/l.  SKIN: No ulceration, lesions, rashes, or cyanosis. Skin warm and dry. Turgor intact.  PSYCHIATRIC: Unable to assess given mental status/medical condition     LABORATORY PANEL:   CBC  Recent Labs Lab 12/02/15 0446  WBC 9.4  HGB 12.5*  HCT 37.8*  PLT 113*   ------------------------------------------------------------------------------------------------------------------  Chemistries   Recent Labs Lab 12/02/15 0446  NA 138  K 3.9  CL 98*  CO2 35*  GLUCOSE 133*  BUN 29*  CREATININE 1.07  CALCIUM 9.0   ------------------------------------------------------------------------------------------------------------------  Cardiac Enzymes  Recent Labs Lab 12/01/15 1145  TROPONINI 0.19*   ------------------------------------------------------------------------------------------------------------------  RADIOLOGY:  No results found.  ASSESSMENT AND PLAN:   60 year old Caucasian gentleman who COPD chronic oxygen requiring admitted 11/30/2015 after results of chest pain  1. Acute on chronic respiratory failure with hypercapnia: Patient extubated 3/16 as per pulmonary. -Doing clinically well.  Off IV Steroids and weaned to Oral pred today - cont. DuoNeb scheduled, Pulmicort nebs.   2. Elevated troponin: Likely secondary to demand ischemia as troponins have trended down. Currently chest pain-free.  3. Tachycardia-sinus tachycardia due to underlying  COPD and respiratory distress. -  improved and resolved.   4. Depression unspecified - continue Lexapro Cont xanax for anxiety  5. Neuropathy-continue gabapentin.  PT recommends rehab. Spoke with pt agreeable. CSW informed  All the records are reviewed and case discussed with Care Management/Social Workerr. Management plans discussed with the patient, family and they are in agreement.  CODE STATUS: Full  DVT prophylaxis- Lovenox  TOTAL TIME TAKING CARE OF THIS PATIENT: 25 minutes  POSSIBLE D/C IN 1-2 DAYS, DEPENDING ON CLINICAL CONDITION.   [name] M.D on 12/04/2015 at 2:57 PM  Between 7am to 6pm - Pager - [phone]  After 6pm: House Pager: - [phone]  [name]  Office  [phone]  CC: Primary care physician; Lyndon CodeKHAN, FOZIA M, MD

## 2015-12-04 NOTE — Progress Notes (Signed)
Patient able to tolerate bipap for appx 6 hours during the night.  Patient did not display any breathing issues with bipap use. Patient did state however he did not sleep any.  o2 resumed at [employer]

## 2015-12-04 NOTE — Progress Notes (Signed)
Patient assisted to the chair for supper.

## 2015-12-05 ENCOUNTER — Inpatient Hospital Stay: Payer: Medicare Other

## 2015-12-05 DIAGNOSIS — J189 Pneumonia, unspecified organism: Secondary | ICD-10-CM

## 2015-12-05 LAB — BASIC METABOLIC PANEL
ANION GAP: 4 — AB (ref 5–15)
BUN: 25 mg/dL — ABNORMAL HIGH (ref 6–20)
CO2: 28 mmol/L (ref 22–32)
Calcium: 8.4 mg/dL — ABNORMAL LOW (ref 8.9–10.3)
Chloride: 107 mmol/L (ref 101–111)
Creatinine, Ser: 0.85 mg/dL (ref 0.61–1.24)
Glucose, Bld: 94 mg/dL (ref 65–99)
POTASSIUM: 3.4 mmol/L — AB (ref 3.5–5.1)
SODIUM: 139 mmol/L (ref 135–145)

## 2015-12-05 LAB — CBC
HCT: 37.6 % — ABNORMAL LOW (ref 40.0–52.0)
Hemoglobin: 12.5 g/dL — ABNORMAL LOW (ref 13.0–18.0)
MCH: 31.5 pg (ref 26.0–34.0)
MCHC: 33.3 g/dL (ref 32.0–36.0)
MCV: 94.6 fL (ref 80.0–100.0)
PLATELETS: 135 10*3/uL — AB (ref 150–440)
RBC: 3.97 MIL/uL — AB (ref 4.40–5.90)
RDW: 14.3 % (ref 11.5–14.5)
WBC: 12.5 10*3/uL — AB (ref 3.8–10.6)

## 2015-12-05 LAB — MAGNESIUM: MAGNESIUM: 2.1 mg/dL (ref 1.7–2.4)

## 2015-12-05 LAB — PHOSPHORUS: PHOSPHORUS: 3.8 mg/dL (ref 2.5–4.6)

## 2015-12-05 MED ORDER — PREDNISONE 20 MG PO TABS
40.0000 mg | ORAL_TABLET | Freq: Every day | ORAL | Status: AC
  Administered 2015-12-06: 40 mg via ORAL
  Filled 2015-12-05: qty 2

## 2015-12-05 MED ORDER — POTASSIUM CHLORIDE 10 MEQ/100ML IV SOLN
10.0000 meq | INTRAVENOUS | Status: DC
  Administered 2015-12-05: 10 meq via INTRAVENOUS
  Filled 2015-12-05 (×3): qty 100

## 2015-12-05 MED ORDER — POTASSIUM CHLORIDE CRYS ER 20 MEQ PO TBCR
40.0000 meq | EXTENDED_RELEASE_TABLET | Freq: Once | ORAL | Status: AC
  Administered 2015-12-05: 40 meq via ORAL
  Filled 2015-12-05: qty 2

## 2015-12-05 MED ORDER — GUAIFENESIN ER 600 MG PO TB12
1200.0000 mg | ORAL_TABLET | Freq: Two times a day (BID) | ORAL | Status: DC
  Administered 2015-12-05 – 2015-12-06 (×3): 1200 mg via ORAL
  Filled 2015-12-05 (×3): qty 2

## 2015-12-05 MED ORDER — SULFAMETHOXAZOLE-TRIMETHOPRIM 800-160 MG PO TABS: 1.0000 | ORAL_TABLET | Freq: Two times a day (BID) | ORAL | Status: DC

## 2015-12-05 MED ORDER — PHENOL 1.4 % MT LIQD
1.0000 | OROMUCOSAL | Status: DC | PRN
  Administered 2015-12-05: 1 via OROMUCOSAL
  Filled 2015-12-05: qty 177

## 2015-12-05 MED ORDER — PREDNISONE 20 MG PO TABS: 20.0000 mg | ORAL_TABLET | Freq: Every day | ORAL | Status: DC

## 2015-12-05 NOTE — Progress Notes (Signed)
PULMONARY / CRITICAL CARE MEDICINE   Name: Dustin CoriaDennis R [name] MRN: [id number] DOB: [birth year]    ADMISSION DATE:  11/30/2015 CONSULTATION DATE:  11/30/15  REFERRING MD: [facility]  PT PROFILE:   [age] M smoker with severe COPD/emphysema admitted early AM 03/14 with CP to R/O MI and transferred to ICU/SDU with severe hypercarbic respiratory failure. Failed BiPAP and was intubated. Now extubated.   MAJOR EVENTS/TEST RESULTS: 03/14 Intubated after failed BiPAP with persistent severe hypercarbia 03/14 TTE: LVEF 60-65%. No RWMAs. Grade I diastolic dysfunction 03/16 Extubated  INDWELLING DEVICES:: ETT 03/14 >> 03/16  MICRO DATA:  MRSA PCR 03/14 >> NEG  ANTIMICROBIALS:   SUBJECTIVE:  No acute issues overnight. C/O loose stools and a wet cough that is occasionally productive of   VITAL SIGNS: BP 112/76 mmHg  Pulse 75  Temp(Src) 98.7 F (37.1 C) (Oral)  Resp 18  Ht 5\' 10"  (1.778 m)  Wt 184 lb 8 oz (83.689 kg)  BMI 26.47 kg/m2  SpO2 95%  HEMODYNAMICS:    VENTILATOR SETTINGS: Vent Mode:  [-]  FiO2 (%):  [40 %] 40 %  INTAKE / OUTPUT: I/O last 3 completed shifts: In: 1203 [P.O.:1200; I.V.:3] Out: 1470 [Urine:1470]  PHYSICAL EXAMINATION: General: NAD Neuro: CNs intact, MAEs, DTRs symmetric HEENT: NCAT Cardiovascular: NSR, S1/S2, no MRG Lungs: very distant BS, no wheezes  Abdomen: soft, NT, +BS Ext: warm, no edema Skin: No lesions or rash  LABS:  BMET  Recent Labs Lab 12/01/15 0559 12/02/15 0446 12/05/15 0326  NA 139 138 139  K 4.6 3.9 3.4*  CL 101 98* 107  CO2 35* 35* 28  BUN 16 29* 25*  CREATININE 1.03 1.07 0.85  GLUCOSE 126* 133* 94    Electrolytes  Recent Labs Lab 12/01/15 0559 12/02/15 0446 12/05/15 0326  CALCIUM 8.6* 9.0 8.4*  MG  --   --  2.1  PHOS  --   --  3.8    CBC  Recent Labs Lab 12/01/15 0559 12/02/15 0446 12/05/15 0326  WBC 5.9 9.4 12.5*  HGB 12.7* 12.5* 12.5*  HCT 39.2* 37.8* 37.6*  PLT 117* 113* 135*     Coag's  Recent Labs Lab 12/01/15 0810  APTT 28  INR 1.12    Sepsis Markers No results for input(s): LATICACIDVEN, PROCALCITON, O2SATVEN in the last 168 hours.  ABG  Recent Labs Lab 11/30/15 1510 11/30/15 1922 11/30/15 2057  PHART 7.14* 7.24* 7.32*  PCO2ART 127* 90* 81*  PO2ART 113* 232* 123*    Liver Enzymes No results for input(s): AST, ALT, ALKPHOS, BILITOT, ALBUMIN in the last 168 hours.  Cardiac Enzymes  Recent Labs Lab 11/30/15 0533 12/01/15 0559 12/01/15 1145  TROPONINI <0.03 0.27* 0.19*    Glucose  Recent Labs Lab 12/01/15 2352 12/02/15 0313 12/02/15 0730 12/02/15 1147 12/02/15 1626 12/02/15 1959  GLUCAP 134* 122* 136* 102* 99 118*    CXR: NNF   ASSESSMENT / PLAN:  PULMONARY A: Acute on chronic hypercarbic respiratory failure Severe emphysema, O2 dependent AECOPD Smoker CAP-Sputum positive for Klebsiella pneumoniae P:   Supplemental O2 to maintain SpO2 88-94% Cont nebulized steroids Cont nebulized BDs Prednisone taper Mandatory nocturnal BiPAP for chronic hypercarbic failure. Patient will need outpatient pulmonology follow-up Bactrim po for klebsiella pneumoniae in sputum  Rest of the treatment plan per primary team  Linley Moskal S. [facility] ANP-BC Pulmonary and Critical Care Medicine [facility] Pager [phone]  12/05/2015, 10:21 AM

## 2015-12-05 NOTE — Consult Note (Signed)
MEDICATION RELATED CONSULT NOTE - INITIAL   Pharmacy Consult for Prednisone Indication: Steroid taper  Allergies  Allergen Reactions  . Asa [Aspirin] Other (See Comments)    Reaction: swelling of the right side.  . Codeine Other (See Comments)    Reaction: Difficulty breathing  . Ibuprofen Other (See Comments)    Reaction: Swelling of the right side.    Patient Measurements: Height: 5\' 10"  (177.8 cm) Weight: 184 lb 8 oz (83.689 kg) IBW/kg (Calculated) : 73  Vital Signs: Temp: 98.7 F (37.1 C) (03/19 0801) Temp Source: Oral (03/19 0801) BP: 112/76 mmHg (03/19 0801) Pulse Rate: 75 (03/19 0801) Intake/Output from previous day: 03/18 0701 - 03/19 0700 In: 603 [P.O.:600; I.V.:3] Out: 720 [Urine:720] Intake/Output from this shift: Total I/O In: 240 [P.O.:240] Out: -   Labs:  Recent Labs  12/05/15 0326  WBC 12.5*  HGB 12.5*  HCT 37.6*  PLT 135*  CREATININE 0.85  MG 2.1  PHOS 3.8   Estimated Creatinine Clearance: 94.2 mL/min (by C-G formula based on Cr of 0.85).   Microbiology: Recent Results (from the past 720 hour(s))  MRSA PCR Screening     Status: None   Collection Time: 11/30/15  5:30 AM  Result Value Ref Range Status   MRSA by PCR NEGATIVE NEGATIVE Final    Comment:        The GeneXpert MRSA Assay (FDA approved for NASAL specimens only), is one component of a comprehensive MRSA colonization surveillance program. It is not intended to diagnose MRSA infection nor to guide or monitor treatment for MRSA infections.   Culture, expectorated sputum-assessment     Status: None   Collection Time: 12/02/15  6:25 PM  Result Value Ref Range Status   Specimen Description EXPECTORATED SPUTUM  Final   Special Requests NONE  Final   Sputum evaluation THIS SPECIMEN IS ACCEPTABLE FOR SPUTUM CULTURE  Final   Report Status 12/04/2015 FINAL  Final  Culture, respiratory (NON-Expectorated)     Status: None   Collection Time: 12/02/15  6:25 PM  Result Value Ref  Range Status   Specimen Description EXPECTORATED SPUTUM  Final   Special Requests NONE Reflexed from [id number]  Final   Gram Stain   Final    MODERATE GRAM POSITIVE COCCI FEW YEAST MODERATE WBC SEEN FEW RED BLOOD CELLS FEW GRAM NEGATIVE RODS    Culture LIGHT GROWTH KLEBSIELLA PNEUMONIAE  Final   Report Status 12/04/2015 FINAL  Final   Organism ID, Bacteria KLEBSIELLA PNEUMONIAE  Final      Susceptibility   Klebsiella pneumoniae - MIC*    AMPICILLIN >=32 RESISTANT Resistant     CEFAZOLIN <=4 SENSITIVE Sensitive     CEFEPIME <=1 SENSITIVE Sensitive     CEFTAZIDIME <=1 SENSITIVE Sensitive     CEFTRIAXONE <=1 SENSITIVE Sensitive     CIPROFLOXACIN <=0.25 SENSITIVE Sensitive     GENTAMICIN <=1 SENSITIVE Sensitive     IMIPENEM <=0.25 SENSITIVE Sensitive     TRIMETH/SULFA <=20 SENSITIVE Sensitive     AMPICILLIN/SULBACTAM 8 SENSITIVE Sensitive     PIP/TAZO <=4 SENSITIVE Sensitive     Extended ESBL NEGATIVE Sensitive     * LIGHT GROWTH KLEBSIELLA PNEUMONIAE    Medical History: Past Medical History  Diagnosis Date  . Asthma   . COPD (chronic obstructive pulmonary disease) (HCC)   . Allergy   . Anxiety   . Chronic diastolic CHF (congestive heart failure) (HCC)     a. echo 07/2013: EF 60-65%, DD, biatrial dilatation, Ao sclerosis,  dilated RV, moderate pulmonary HTN, elevated CV and RA pressures; b. patient reported echo at Dr. Milta Deiters office 02/2015 - his office does not have record of him being a pt there c. echo 11/2015: EF 60-65%, Grade 1 DD, mod-severe pulm pressures  . Depression   . Emphysema of lung (HCC)   . GERD (gastroesophageal reflux disease)   . Tobacco abuse   . Depression   . Chronic respiratory failure (HCC)     a. on 2L via nasal cannula; b. secondary to COPD  . Personal history of tobacco use, presenting hazards to health 08/17/2015    Medications:  Scheduled:  . budesonide (PULMICORT) nebulizer solution  0.25 mg Nebulization Q6H WA  . enoxaparin (LOVENOX)  injection  40 mg Subcutaneous Q24H  . escitalopram  20 mg Oral Daily  . gabapentin  300 mg Oral TID  . guaiFENesin  1,200 mg Oral BID  . Influenza vac split quadrivalent PF  0.5 mL Intramuscular Tomorrow-1000  . ipratropium-albuterol  3 mL Nebulization Q6H  . [START ON 12/06/2015] predniSONE  40 mg Oral Q breakfast   Followed by  . [START ON 12/07/2015] predniSONE  20 mg Oral Q breakfast  . sodium chloride flush  3 mL Intravenous Q12H    Assessment: Dustin [name] os a 60yo male presenting with chest pain and SOB. Pharmacy consulted to taper steroids.  Plan:  Due to short course of steroids, will taper more aggressively with decrease in  daily. On prednisone  PO today, will taper to  PO tomorrow and  PO on Tuesday.  Cy Blamer 12/05/2015,1:22 PM

## 2015-12-05 NOTE — Progress Notes (Signed)
Patient assisted to the chair for lunch. Call light/phone within reach.

## 2015-12-05 NOTE — Progress Notes (Signed)
[facility] - [place] at [facility]   PATIENT NAME: Dustin PhenixDennis [name]    MR#:  [id number]  DATE OF BIRTH:  10/17/1955  SUBJECTIVE:  Extubated on 12/02/15 Shortness of breath much improving slowly  Positive cough but nonproductive. No complaints presently.  REVIEW OF SYSTEMS:   Review of Systems  Constitutional: Negative for fever and chills.  HENT: Negative for congestion and tinnitus.   Eyes: Negative for blurred vision and double vision.  Respiratory: Positive for cough and shortness of breath. Negative for wheezing.   Cardiovascular: Negative for chest pain, orthopnea and PND.  Gastrointestinal: Negative for nausea, vomiting, abdominal pain and diarrhea.  Genitourinary: Negative for dysuria and hematuria.  Neurological: Negative for dizziness, sensory change and focal weakness.  All other systems reviewed and are negative.  DRUG ALLERGIES:   Allergies  Allergen Reactions  . Asa [Aspirin] Other (See Comments)    Reaction: swelling of the right side.  . Codeine Other (See Comments)    Reaction: Difficulty breathing  . Ibuprofen Other (See Comments)    Reaction: Swelling of the right side.    VITALS:  Blood pressure 112/76, pulse 75, temperature 98.7 F (37.1 C), temperature source Oral, resp. rate 18, height 5\' 10"  (1.778 m), weight 83.689 kg (184 lb 8 oz), SpO2 95 %.  PHYSICAL EXAMINATION:   VITAL SIGNS: Filed Vitals:   12/05/15 0350 12/05/15 0801  BP: 133/83 112/76  Pulse: 67 75  Temp: 98.1 F (36.7 C) 98.7 F (37.1 C)  Resp: 19 18   GENERAL:60 y.o.male lying in bed in NAD.   HEAD: Normocephalic, atraumatic.  EYES: Pupils equal, round, reactive to light. EOMI. No scleral icterus.  MOUTH: Moist mucosal membrane. Dentition intact. No abscess noted.  EAR, NOSE, THROAT: Clear without exudates. No external lesions.  NECK: Supple. No thyromegaly. No nodules. No JVD.  PULMONARY: Coarse breath sounds, without wheeze rails or rhonci. No use of  accessory muscles, good air entry bilaterally, prolonged inspiratory and expiratory phase.  CHEST: Nontender to palpation.  CARDIOVASCULAR: S1 and S2. Tachycardic. No murmurs, rubs, or gallops. No edema. Pedal pulses 2+ bilaterally.  GASTROINTESTINAL: Soft, nontender, nondistended. No masses. Positive bowel sounds. No hepatosplenomegaly.  MUSCULOSKELETAL: No swelling, clubbing, or edema. Range of motion full in all extremities.  NEUROLOGIC: No focal motor or sensory deficits appreciate b/l.  SKIN: No ulceration, lesions, rashes, or cyanosis. Skin warm and dry. Turgor intact.  PSYCHIATRIC: Unable to assess given mental status/medical condition  LABORATORY PANEL:   CBC  Recent Labs Lab 12/05/15 0326  WBC 12.5*  HGB 12.5*  HCT 37.6*  PLT 135*   ------------------------------------------------------------------------------------------------------------------  Chemistries   Recent Labs Lab 12/05/15 0326  NA 139  K 3.4*  CL 107  CO2 28  GLUCOSE 94  BUN 25*  CREATININE 0.85  CALCIUM 8.4*  MG 2.1   ------------------------------------------------------------------------------------------------------------------  Cardiac Enzymes  Recent Labs Lab 12/01/15 1145  TROPONINI 0.19*   ------------------------------------------------------------------------------------------------------------------  RADIOLOGY:  Dg Chest Port 1 View  12/05/2015  CLINICAL DATA:  Acute respiratory failure EXAM: PORTABLE CHEST 1 VIEW COMPARISON:  12/01/15 chest radiograph. FINDINGS: Stable cardiomediastinal silhouette with mild cardiomegaly. No pneumothorax. No pleural effusion. Emphysema. No pulmonary edema. No acute consolidative airspace disease. Hyperinflated lungs. IMPRESSION: 1. Stable marked cardiomegaly without pulmonary edema. 2. Hyperinflated lungs and emphysema, suggesting COPD. No acute consolidative airspace disease. Electronically Signed   By: Delbert PhenixJason A Poff M.D.   On: 12/05/2015 08:39     ASSESSMENT AND PLAN:   60 year old Caucasian gentleman  who COPD chronic oxygen requiring admitted 11/30/2015 after results of chest pain  1. Acute on chronic respiratory failure with hypercapnia: Patient extubated 3/16 as per pulmonary. -Doing clinically well.  Off IV Steroids and weaned to Oral pred - cont. DuoNeb scheduled, Pulmicort nebs.  -sputum very light growth of kleibsiella ?colonization  2. Elevated troponin: Likely secondary to demand ischemia as troponins have trended down - Currently chest pain-free.  3. Tachycardia-sinus tachycardia due to underlying COPD and respiratory distress. - improved and resolved.   4. Depression unspecified - continue Lexapro Cont xanax for anxiety  5. Neuropathy-continue gabapentin.  PT  Now recommends HHPT. Spoke with pt agreeable. CM informed Pt will d./c in am if cont to show improvement  All the records are reviewed and case discussed with Care Management/Social Workerr. Management plans discussed with the patient, family and they are in agreement.  CODE STATUS: Full  DVT prophylaxis- Lovenox  TOTAL TIME TAKING CARE OF THIS PATIENT: 25 minutes  POSSIBLE D/C IN 1-2 DAYS, DEPENDING ON CLINICAL CONDITION.   Dustin [name] M.D on 12/05/2015 at 11:16 AM  Between 7am to 6pm - Pager - [phone]  After 6pm: House Pager: - [phone]  [name]  Office  [phone]  CC: Primary care physician; Lyndon Code, MD

## 2015-12-06 ENCOUNTER — Other Ambulatory Visit: Payer: Self-pay | Admitting: *Deleted

## 2015-12-06 MED ORDER — ALPRAZOLAM 1 MG PO TABS: 1.0000 mg | ORAL_TABLET | Freq: Three times a day (TID) | ORAL | Status: DC | PRN

## 2015-12-06 MED ORDER — ALBUTEROL SULFATE (2.5 MG/3ML) 0.083% IN NEBU: 2.5000 mg | INHALATION_SOLUTION | RESPIRATORY_TRACT | Status: DC | PRN

## 2015-12-06 MED ORDER — BUDESONIDE 0.25 MG/2ML IN SUSP: 0.2500 mg | Freq: Four times a day (QID) | RESPIRATORY_TRACT | Status: DC

## 2015-12-06 MED ORDER — PREDNISONE 10 MG PO TABS: 50.0000 mg | ORAL_TABLET | Freq: Every day | ORAL | Status: DC

## 2015-12-06 NOTE — Care Management (Addendum)
PT requested by Dr. Allena KatzPatel as patient was being discharged to home with resumption of home health through [facility] however a family member has refused for patient to return home and wants him to go to SNF. CSW working on that. I will update [facility] with plan as soon as PT re-evaluates this patient.  Spoke with patient and he agrees to discharge to home today. No further RNCM needs. Case closed.

## 2015-12-06 NOTE — Progress Notes (Signed)
Spoke with Will, [employer] rep at [phone], to notify of non-emergent EMS transport.  Auth notification reference given as [id number].   Service date range good from 12/06/15 - 03/05/16.   Gap exception requested to determine if services can be considered at an in-network level.

## 2015-12-06 NOTE — Progress Notes (Signed)
Patient ID: Dustin CoriaDennis R Grecco, male   DOB: 03/14/1956, 60 y.o.   MRN: [id number] Spoke with patient's sister Rayfield CitizenCaroline or the phone. She was concerned about patient being sent home feeling weak and wanted to have physical therapy reevaluated to see if he is appropriate for rehabilitation. Patient eating breakfast. Feeling better. However were will get reevaluation with PT Patient medically is at his baseline at present. Sister was informed. We'll await physical therapy evaluation and decide on discharge planning

## 2015-12-06 NOTE — Care Management (Addendum)
Advised patient to work with his O2 tank he uses at home. I also asked patient to contact his sister as he is his own Management consultantdecision maker. Patient agrees to contact his sister and notify her of discharge to home today. Patient  Refused cardiac/pulm rehab. States he's going to quit smoking. I have asked Dr. Allena KatzPatel for home order for portable O2 assessment so that patient an ambulate better with new walker- patient agrees. I have notified Will with [facility] of this assessment need. Patient states he will be going to [place] address with another family member- lots of family dynamics per his sister Rayfield CitizenCaroline (lives in [place]) [phone]. "Wife wastes his money on other things than medications" per Napoleonaroline. He states he does not remember [facility] address but that [name] has followed [facility] there before. Rayfield CitizenCaroline said that patient told her that he was worried [name] would not go to [place] but when I talked to him he said "yeah they've been there before".  He agrees to telling [name] if he changes address. Rx received and delivered to Will with [facility] for portable O2 assessment in the home. Case closed.  New Address: [address] [name] [address]. [phone].

## 2015-12-06 NOTE — Progress Notes (Signed)
Bipap refused 

## 2015-12-06 NOTE — Discharge Summary (Signed)
[facility] - [place] at [facility]   PATIENT NAME: Dustin [name]    MR#:  [id number]  DATE OF BIRTH:  [birth year]  DATE OF ADMISSION:  11/30/2015 ADMITTING PHYSICIAN: Arnaldo Natal, MD  DATE OF DISCHARGE: 12/06/2015  PRIMARY CARE PHYSICIAN: Lyndon Code, MD    ADMISSION DIAGNOSIS:  Chest pain, unspecified chest pain type [R07.9]  DISCHARGE DIAGNOSIS:  Acute on chronic hypoxic respiratory failure secondary to COPD flare requiring mechanical intubation  Chronic respiratory failure due to chronic COPD Anxiety  SECONDARY DIAGNOSIS:   Past Medical History  Diagnosis Date  . Asthma   . COPD (chronic obstructive pulmonary disease) (HCC)   . Allergy   . Anxiety   . Chronic diastolic CHF (congestive heart failure) (HCC)     a. echo 07/2013: EF 60-65%, DD, biatrial dilatation, Ao sclerosis, dilated RV, moderate pulmonary HTN, elevated CV and RA pressures; b. patient reported echo at Dr. Milta Deiters office 02/2015 - his office does not have record of him being a pt there c. echo 11/2015: EF 60-65%, Grade 1 DD, mod-severe pulm pressures  . Depression   . Emphysema of lung (HCC)   . GERD (gastroesophageal reflux disease)   . Tobacco abuse   . Depression   . Chronic respiratory failure (HCC)     a. on 2L via nasal cannula; b. secondary to COPD  . Personal history of tobacco use, presenting hazards to health 08/17/2015    HOSPITAL COURSE:  60 year old Caucasian gentleman who COPD chronic oxygen requiring admitted 11/30/2015 after results of chest pain  1. Acute on chronic respiratory failure with hypercapnia: Patient extubated 3/16 as per pulmonary. -Doing clinically well. Off IV Steroids and weaned to Oral pred - cont. DuoNeb scheduled, Pulmicort nebs.  -sputum very light growth of kleibsiella ?colonization Patient down to his baseline oxygen 2 L/m sats are 92% and more -He'll follow up with pulmonary as outpatient  2. Elevated troponin: Likely  secondary to demand ischemia as troponins have trended down - Currently chest pain-free.  3. Tachycardia-sinus tachycardia due to underlying COPD and respiratory distress. - improved and resolved.   4. Depression unspecified - continue Lexapro Cont xanax for anxiety  5. Neuropathy-continue gabapentin.  PT recommends HHPT. Spoke with pt agreeable. CM informed Pt will d./c later this afternoon  Patient has nebulizer machine at home which he has been using on a regular basis.  CONSULTS OBTAINED:  Treatment Team:  Merwyn Katos, MD  DRUG ALLERGIES:   Allergies  Allergen Reactions  . Asa [Aspirin] Other (See Comments)    Reaction: swelling of the right side.  . Codeine Other (See Comments)    Reaction: Difficulty breathing  . Ibuprofen Other (See Comments)    Reaction: Swelling of the right side.    DISCHARGE MEDICATIONS:   Current Discharge Medication List    START taking these medications   Details  albuterol (PROVENTIL) (2.5 MG/3ML) 0.083% nebulizer solution Take 3 mLs (2.5 mg total) by nebulization every 3 (three) hours as needed for shortness of breath. Qty: 75 mL, Refills: 12    budesonide (PULMICORT) 0.25 MG/2ML nebulizer solution Take 2 mLs (0.25 mg total) by nebulization every 6 (six) hours. Qty: 60 mL, Refills: 12    predniSONE (DELTASONE) 10 MG tablet Take 5 tablets (50 mg total) by mouth daily with breakfast. Qty: 15 tablet, Refills: 0      CONTINUE these medications which have CHANGED   Details  ALPRAZolam (XANAX) 1 MG tablet Take 1 tablet (1  mg total) by mouth 3 (three) times daily as needed for anxiety. Qty: 15 tablet, Refills: 0      CONTINUE these medications which have NOT CHANGED   Details  Albuterol Sulfate (PROAIR RESPICLICK) 108 (90 BASE) MCG/ACT AEPB Inhale 2 puffs into the lungs every 4 (four) hours as needed (for shortness of breath).    doxazosin (CARDURA) 4 MG tablet Take 4 mg by mouth daily.    escitalopram (LEXAPRO) 20 MG tablet  Take 20 mg by mouth at bedtime.     gabapentin (NEURONTIN) 600 MG tablet Take 600 mg by mouth 3 (three) times daily.    ipratropium-albuterol (DUONEB) 0.5-2.5 (3) MG/3ML SOLN Take 3 mLs by nebulization 4 (four) times daily as needed (for shortness of breath).    mometasone-formoterol (DULERA) 200-5 MCG/ACT AERO Inhale 2 puffs into the lungs 2 (two) times daily.    theophylline (UNIPHYL) 400 MG 24 hr tablet Take 400 mg by mouth 2 (two) times daily.      STOP taking these medications     gabapentin (NEURONTIN) 300 MG capsule         If you experience worsening of your admission symptoms, develop shortness of breath, life threatening emergency, suicidal or homicidal thoughts you must seek medical attention immediately by calling 911 or calling your MD immediately  if symptoms less severe.  You Must read complete instructions/literature along with all the possible adverse reactions/side effects for all the Medicines you take and that have been prescribed to you. Take any new Medicines after you have completely understood and accept all the possible adverse reactions/side effects.   Please note  You were cared for by a hospitalist during your hospital stay. If you have any questions about your discharge medications or the care you received while you were in the hospital after you are discharged, you can call the unit and asked to speak with the hospitalist on call if the hospitalist that took care of you is not available. Once you are discharged, your primary care physician will handle any further medical issues. Please note that NO REFILLS for any discharge medications will be authorized once you are discharged, as it is imperative that you return to your primary care physician (or establish a relationship with a primary care physician if you do not have one) for your aftercare needs so that they can reassess your need for medications and monitor your lab values. Today   SUBJECTIVE   Slept  the whole lot better  VITAL SIGNS:  Blood pressure 110/65, pulse 59, temperature 98.1 F (36.7 C), temperature source Oral, resp. rate 18, height 5\' 10"  (1.778 m), weight 83.263 kg (183 lb 9 oz), SpO2 92 %.  I/O:    Intake/Output Summary (Last 24 hours) at 12/06/15 0853 Last data filed at 12/06/15 0520  Gross per 24 hour  Intake    483 ml  Output   1000 ml  Net   -517 ml    PHYSICAL EXAMINATION:  GENERAL:  60 y.o.-year-old patient lying in the bed with no acute distress. Appears chronically ill EYES: Pupils equal, round, reactive to light and accommodation. No scleral icterus. Extraocular muscles intact.  HEENT: Head atraumatic, normocephalic. Oropharynx and nasopharynx clear.  NECK:  Supple, no jugular venous distention. No thyroid enlargement, no tenderness.  LUNGS: Distant breath sounds breath sounds bilaterally, no wheezing, rales,rhonchi or crepitation. No use of accessory muscles of respiration.  CARDIOVASCULAR: S1, S2 normal. No murmurs, rubs, or gallops.  ABDOMEN: Soft, non-tender, non-distended. Bowel  sounds present. No organomegaly or mass.  EXTREMITIES: No pedal edema, cyanosis, or clubbing.  NEUROLOGIC: Cranial nerves II through XII are intact. Muscle strength 5/5 in all extremities. Sensation intact. Gait not checked.  PSYCHIATRIC: The patient is alert and oriented x 3.  SKIN: No obvious rash, lesion, or ulcer.   DATA REVIEW:   CBC   Recent Labs Lab 12/05/15 0326  WBC 12.5*  HGB 12.5*  HCT 37.6*  PLT 135*    Chemistries   Recent Labs Lab 12/05/15 0326  NA 139  K 3.4*  CL 107  CO2 28  GLUCOSE 94  BUN 25*  CREATININE 0.85  CALCIUM 8.4*  MG 2.1    Microbiology Results   Recent Results (from the past 240 hour(s))  MRSA PCR Screening     Status: None   Collection Time: 11/30/15  5:30 AM  Result Value Ref Range Status   MRSA by PCR NEGATIVE NEGATIVE Final    Comment:        The GeneXpert MRSA Assay (FDA approved for NASAL specimens only), is  one component of a comprehensive MRSA colonization surveillance program. It is not intended to diagnose MRSA infection nor to guide or monitor treatment for MRSA infections.   Culture, expectorated sputum-assessment     Status: None   Collection Time: 12/02/15  6:25 PM  Result Value Ref Range Status   Specimen Description EXPECTORATED SPUTUM  Final   Special Requests NONE  Final   Sputum evaluation THIS SPECIMEN IS ACCEPTABLE FOR SPUTUM CULTURE  Final   Report Status 12/04/2015 FINAL  Final  Culture, respiratory (NON-Expectorated)     Status: None   Collection Time: 12/02/15  6:25 PM  Result Value Ref Range Status   Specimen Description EXPECTORATED SPUTUM  Final   Special Requests NONE Reflexed from [id number]  Final   Gram Stain   Final    MODERATE GRAM POSITIVE COCCI FEW YEAST MODERATE WBC SEEN FEW RED BLOOD CELLS FEW GRAM NEGATIVE RODS    Culture LIGHT GROWTH KLEBSIELLA PNEUMONIAE  Final   Report Status 12/04/2015 FINAL  Final   Organism ID, Bacteria KLEBSIELLA PNEUMONIAE  Final      Susceptibility   Klebsiella pneumoniae - MIC*    AMPICILLIN >=32 RESISTANT Resistant     CEFAZOLIN <=4 SENSITIVE Sensitive     CEFEPIME <=1 SENSITIVE Sensitive     CEFTAZIDIME <=1 SENSITIVE Sensitive     CEFTRIAXONE <=1 SENSITIVE Sensitive     CIPROFLOXACIN <=0.25 SENSITIVE Sensitive     GENTAMICIN <=1 SENSITIVE Sensitive     IMIPENEM <=0.25 SENSITIVE Sensitive     TRIMETH/SULFA <=20 SENSITIVE Sensitive     AMPICILLIN/SULBACTAM 8 SENSITIVE Sensitive     PIP/TAZO <=4 SENSITIVE Sensitive     Extended ESBL NEGATIVE Sensitive     * LIGHT GROWTH KLEBSIELLA PNEUMONIAE    RADIOLOGY:  Dg Chest Port 1 View  12/05/2015  CLINICAL DATA:  Acute respiratory failure EXAM: PORTABLE CHEST 1 VIEW COMPARISON:  12/01/15 chest radiograph. FINDINGS: Stable cardiomediastinal silhouette with mild cardiomegaly. No pneumothorax. No pleural effusion. Emphysema. No pulmonary edema. No acute consolidative airspace  disease. Hyperinflated lungs. IMPRESSION: 1. Stable marked cardiomegaly without pulmonary edema. 2. Hyperinflated lungs and emphysema, suggesting COPD. No acute consolidative airspace disease. Electronically Signed   By: Delbert Phenix M.D.   On: 12/05/2015 08:39     Management plans discussed with the patient, family and they are in agreement.  CODE STATUS:     Code Status Orders  Start     Ordered   11/30/15 0513  Full code   Continuous     11/30/15 0512    Code Status History    Date Active Date Inactive Code Status Order ID Comments User Context   04/13/2015  5:22 PM 04/21/2015  7:40 PM Full Code [id number]  Auburn Bilberry, MD Inpatient   03/31/2015  8:50 PM 04/05/2015  8:48 PM Full Code [id number]  Wyatt Haste, MD ED      TOTAL TIME TAKING CARE OF THIS PATIENT: 40 minutes.    Irys Nigh M.D on 12/06/2015 at 8:53 AM  Between 7am to 6pm - Pager - [phone] After 6pm go to [email or web address] - password [email or web address] [facility]  [place] [facility]  Office  [phone]  CC: Primary care physician; Lyndon Code, MD

## 2015-12-06 NOTE — [place] FL2 (Signed)
[place] MEDICAID FL2 LEVEL OF CARE SCREENING TOOL     IDENTIFICATION  Patient Name: Dustin [name] Birthdate: 1956/06/15 Sex: male Admission Date (Current Location): 11/30/2015  [place] and [place] Number:  Chiropodist and Address:  [facility], [address] [place], [place] [address]      Provider Number: [id number]  Attending Physician Name and Address:  Enedina Finner, MD  Relative Name and Phone Number:       Current Level of Care: Hospital Recommended Level of Care: Skilled Nursing Facility Prior Approval Number:    Date Approved/Denied:   PASRR Number:  ( [id number] )  Discharge Plan: SNF    Current Diagnoses: Patient Active Problem List   Diagnosis Date Noted  . Acute on chronic respiratory failure with hypercapnia (HCC)   . Endotracheally intubated   . Chest pain with low risk of acute coronary syndrome 11/30/2015  . Chronic diastolic heart failure (HCC) 11/30/2015  . Sinus tachycardia seen on cardiac monitor 11/30/2015  . Hypercapnic respiratory failure (HCC) 11/30/2015  . Pulmonary hypertension (HCC)   . Centrilobular emphysema (HCC)   . Lethargy   . Acute respiratory failure (HCC)   . Personal history of tobacco use, presenting hazards to health 08/17/2015  . Pressure ulcer 04/19/2015  . COPD with acute exacerbation (HCC)   . Acute on chronic diastolic CHF (congestive heart failure) (HCC)   . COPD with respiratory distress, acute (HCC) 04/13/2015  . COPD exacerbation (HCC) 03/31/2015    Orientation RESPIRATION BLADDER Height & Weight     Self, Time, Situation, Place  O2 (2 Liters Oxygen ) Continent Weight: 183 lb 9 oz (83.263 kg) Height:   (177.8 cm)  BEHAVIORAL SYMPTOMS/MOOD NEUROLOGICAL BOWEL NUTRITION STATUS   (none )  (none ) Continent Diet (Diet: Heart Healthy )  AMBULATORY STATUS COMMUNICATION OF NEEDS Skin   Extensive Assist Verbally Normal                       Personal Care Assistance  Level of Assistance  Bathing, Feeding, Dressing Bathing Assistance: Limited assistance Feeding assistance: Independent Dressing Assistance: Limited assistance     Functional Limitations Info  Sight, Hearing, Speech Sight Info: Adequate Hearing Info: Adequate Speech Info: Adequate    SPECIAL CARE FACTORS FREQUENCY  PT (By licensed PT), OT (By licensed OT)     PT Frequency:  (5) OT Frequency:  (5)            Contractures      Additional Factors Info  Code Status, Allergies Code Status Info:  (Full Code. ) Allergies Info:  (Asa, Codeine, Ibuprofen)           Current Medications (12/06/2015):  This is the current hospital active medication list Current Facility-Administered Medications  Medication Dose Route Frequency Provider Last Rate Last Dose  . acetaminophen (TYLENOL) suppository 650 mg  650 mg Rectal Q6H PRN Arnaldo Natal, MD      . albuterol (PROVENTIL) (2.5 MG/3ML) 0.083% nebulizer solution 2.5 mg  2.5 mg Nebulization Q3H PRN Merwyn Katos, MD      . ALPRAZolam Prudy Feeler) tablet 1 mg  1 mg Oral TID PRN Enedina Finner, MD   1 mg at 12/05/15 2110  . budesonide (PULMICORT) nebulizer solution 0.25 mg  0.25 mg Nebulization Q6H WA Wyatt Haste, MD   0.25 mg at 12/05/15 2104  . docusate sodium (COLACE) capsule 100 mg  100 mg Oral BID PRN Rolly Pancake  Sainani, MD      . enoxaparin (LOVENOX) injection 40 mg  40 mg Subcutaneous Q24H Merwyn Katosavid B Simonds, MD   40 mg at 12/05/15 1039  . escitalopram (LEXAPRO) tablet 20 mg  20 mg Oral Daily Merwyn Katosavid B Simonds, MD   20 mg at 12/05/15 0848  . gabapentin (NEURONTIN) capsule 300 mg  300 mg Oral TID Merwyn Katosavid B Simonds, MD   300 mg at 12/05/15 2110  . guaiFENesin (MUCINEX) 12 hr tablet 1,200 mg  1,200 mg Oral BID Lewie LoronMagadalene S Tukov, NP   1,200 mg at 12/05/15 2110  . Influenza vac split quadrivalent PF (FLUARIX) injection 0.5 mL  0.5 mL Intramuscular Tomorrow-1000 Arnaldo NatalMichael S Diamond, MD   Stopped at 12/01/15 1304  . ipratropium-albuterol (DUONEB)  0.5-2.5 (3) MG/3ML nebulizer solution 3 mL  3 mL Nebulization Q6H Merwyn Katosavid B Simonds, MD   3 mL at 12/06/15 0800  . nitroGLYCERIN (NITROSTAT) SL tablet 0.4 mg  0.4 mg Sublingual Q5 min PRN Arnaldo NatalMichael S Diamond, MD      . ondansetron [facility](ZOFRAN) injection 4 mg  4 mg Intravenous Q6H PRN Arnaldo NatalMichael S Diamond, MD      . phenol [facility](CHLORASEPTIC) mouth spray 1 spray  1 spray Mouth/Throat PRN Enedina FinnerSona Patel, MD   1 spray at 12/05/15 1825  . predniSONE (DELTASONE) tablet 40 mg  40 mg Oral Q breakfast Cy BlamerAllison K Tharakan, RPH       Followed by  . [START ON 12/07/2015] predniSONE (DELTASONE) tablet 20 mg  20 mg Oral Q breakfast Cy BlamerAllison K Tharakan, RPH      . senna-docusate (Senokot-S) tablet 1 tablet  1 tablet Oral BID PRN Houston SirenVivek J Sainani, MD      . sodium chloride flush (NS) 0.9 % injection 3 mL  3 mL Intravenous Q12H Arnaldo NatalMichael S Diamond, MD   3 mL at 12/05/15 2117     Discharge Medications: Please see discharge summary for a list of discharge medications.  Relevant Imaging Results:  Relevant Lab Results:   Additional Information  (SSN: [id number])  Haig ProphetMorgan, [place]ie G, [name]

## 2015-12-06 NOTE — Care Management (Signed)
Rolling walker requested from [facility]. O2 had to be turned up to 4 liters with ambulation. Patient does not think his O2 at home will go up to 4 liters. I am checking with [facility]. Per Will with [facility] patient's O2 at home will go up to 6 liters and should not be a problem for patient.

## 2015-12-06 NOTE — Progress Notes (Signed)
Physical Therapy Treatment Patient Details Name: Dustin [name] MRN: [id number] DOB: [birth year] Today's Date: 12/06/2015    History of Present Illness presented to ER secondary to chest pain; admitted to rule out MI (elevation in troponin noted, now attributed to demand ischemia).  Hospital course complicated by acute hypercapnic respiratory failure requiring transfer to CCU and intubation 3/14-3/16; currently on 2L supplemental O2 via [place].    PT Comments    Progressive increase in gait distance and overall activity tolerance this date.  Able to complete ambulation appropriate for household distances and safely negotiates stairs required for entry/exit of home.  No episodes of balance loss or instability noted this date.  Do recommend continued use of RW for optimal safety/indep and overall energy conservation at this time; patient voiced understanding. BORG 5/10 after bouts of activity, but patient able to self-pace and regulate without difficulty.  Does require increase in supplemental O2 to 4L with activity to maintain sats >88-89% with activity.  RN, care management informed/aware and to follow up with patient (unsure of current O2 systems ability to produce 4L in home environment).   Follow Up Recommendations  Home health PT     Equipment Recommendations  Rolling walker with 5" wheels    Recommendations for Other Services       Precautions / Restrictions Precautions Precautions: Fall Restrictions Weight Bearing Restrictions: No    Mobility  Bed Mobility Overal bed mobility: Independent                Transfers Overall transfer level: Needs assistance Equipment used: Rolling walker (2 wheeled) Transfers: Sit to/from Stand Sit to Stand: Min guard;Supervision         General transfer comment: requires bilat UEs to complete  Ambulation/Gait Ambulation/Gait assistance: Min guard Ambulation Distance (Feet): 100 Feet (x2) Assistive device: Rolling walker (2  wheeled)       General Gait Details: reciprocal stepping with slow, guarded cadence and overall gait performance.  No buckling or overt LOB noted.  Does currently require supplemental O2 increase to 4L with activity to maintain >88-89% with activity.   Stairs Stairs: Yes Stairs assistance: Min guard Stair Management: Two rails Number of Stairs: 4 General stair comments: step to gait pattern, slow and steady without LOB  Wheelchair Mobility    Modified Rankin (Stroke Patients Only)       Balance                                    Cognition Arousal/Alertness: Awake/alert Behavior During Therapy: WFL for tasks assessed/performed Overall Cognitive Status: Within Functional Limits for tasks assessed                      Exercises Other Exercises Other Exercises: Reinforced need for activity pacing and energy conservation (recommend use of RW); discussed progressive endurance training, but awareness and adherence to sign/symptoms of fatigue.  Patient voiced understanding of all infmoration.    General Comments        Pertinent Vitals/Pain Pain Assessment: No/denies pain    Home Living                      Prior Function            PT Goals (current goals can now be found in the care plan section) Acute Rehab PT Goals Patient Stated Goal: to get back home PT Goal  Formulation: With patient Time For Goal Achievement: 12/17/15 Potential to Achieve Goals: Good Progress towards PT goals: Progressing toward goals    Frequency  Min 2X/week    PT Plan Current plan remains appropriate    Co-evaluation             End of Session Equipment Utilized During Treatment: Gait belt;Oxygen Activity Tolerance: Patient tolerated treatment well Patient left: in bed;with call bell/phone within reach;with bed alarm set     Time: [id number] PT Time Calculation (min) (ACUTE ONLY): 34 min  Charges:  $Gait Training: 23-37 mins                     G Codes:      Dustin [name], PT, DPT, NCS 12/06/2015, 1:34 PM [phone]

## 2015-12-06 NOTE — Progress Notes (Signed)
Sister Rayfield CitizenCaroline asking questions about pt going home on an antibiotic.  Dr patel notified and said the she had discussed the pt with pulmonary and the conclusion was that the pt was colonizing klebsiella and that it was not an active infection.  Pt assisted by me to wheelchair and waiting car with instructions, xanax rx, belongings, and concentrator

## 2015-12-06 NOTE — Consult Note (Signed)
   [facility] Inpatient Consult   12/06/2015  [name] 05/13/1956 [id number]  Patient screened for [facility]. Patient noted to be eligible, with diagnosis of HF, and COPD for post hospital discharge follow up. Patient was evaluated for community based chronic disease management services with [facility] care Management Program as a benefit of patient's [employer]. Met with the patient at the bedside to explain [place] Management services. Patient endorses his primary care provider to be Dr. Suzan Garibaldi. Patient states he was previously receiving [place] services from [facility] and receives 02 supplies from [facility]. Consent form signed. Patient stated he was moving in with his son and his address will be different. Patient also gave permission to speak with his son [name] @ [phone] or his daughter in law [name] @ [phone]. His new home number will be [phone]. Patient states his cell phone is not turned on right now but will be soon after discharge and is [phone]. Patient will receive post hospital discharge calls and be evaluated for monthly home visits. [facility] Care Management services does not interfere with or replace any services arranged by the inpatient care management team. RNCM left contact information and [facility] literature at the bedside. Made inpatient RNCM aware that [employer] will be following for care management. For additional questions please contact:   Trendon Zaring RN, [place] [facility] Liaison  [phone]) Business Mobile [phone]) Toll free office

## 2015-12-06 NOTE — Progress Notes (Signed)
Clinical Child psychotherapistocial Worker (CSW) was consulted by MD stating that patient will D/C today to home or SNF depending on PT's recommendation today. PT is recommending home health PT, RN, RN Aide and rolling walker. RN Case Manager aware of above. Please reconsult if future social work needs arise. CSW signing off.   Jetta LoutBailey Morgan, [name] [phone]) [phone]

## 2015-12-06 NOTE — Discharge Instructions (Signed)
User oxygen as before. Usual breathing machine/nebulizer as before

## 2015-12-06 NOTE — Care Management Important Message (Signed)
Important Message  Patient Details  Name: Dustin [name] MRN: [id number] Date of Birth: 1956-02-01   Medicare Important Message Given:  Yes    Olegario MessierKathy A Raygen Dahm 12/06/2015, 10:46 AM

## 2015-12-07 NOTE — Consult Note (Signed)
   [facility]   12/07/2015  Dustin [name] 1956/03/04 [id number]  [facility] a [facility] from [facility] Liaison [place] PughJim Henderson [phone] requesting patient be evaluated for COPD Gold. Note sent to care management assistant for evaluation of eligibility, and reccommended Mr. Orson AloeHenderson to call Serina Cowperlisa, RNCM assigned to patient for post hospital follow-up. For questions please contact:  Bryella Diviney RN, BSN [facility]  Hospital Liaison  [phone]) Business Mobile [phone]) Toll free office

## 2015-12-10 ENCOUNTER — Other Ambulatory Visit: Payer: Self-pay | Admitting: *Deleted

## 2015-12-10 ENCOUNTER — Encounter: Payer: Self-pay | Admitting: *Deleted

## 2015-12-10 DIAGNOSIS — J441 Chronic obstructive pulmonary disease with (acute) exacerbation: Secondary | ICD-10-CM

## 2015-12-10 NOTE — Patient Outreach (Addendum)
[name]) Care Management  12/10/2015  Dustin CoriaDennis R [name] 01/21/1956 [id number]  I reached out to [name] today on his personal cell phone [phone] when unable to reach on his home phone. He tells me today he is staying with his son and daughter in law and not at his home as he has some "stress" in is relationship with his wife and feels it is better for him to recover at the home of his son.   We spent a great deal of time discussing medications today. Dustin [name] reports that he feels his recent episode of respiratory failure/COPD exacerbation is related to his inconsistent ability to pay for his medications. He told me he ran out of samples of Dulera and Spiriva provided to him by his primary care provider. I reached out to Dr. Santo HeldKahn's office this afternoon and learned that the office staff there began the manufacturer medication assistance program application in early march. The patient attestation was mailed to [name] on 11/23/15 and he did not complete it prior to his hospitalization. He was just discharged to his son/s home this week and his paperwork was not available to him. He will attain it and complete and mail as requested. In addition, Dustin [name] is scheduled for a follow up appointment on 12/14/15. During this visit, Dustin [name] is also to discuss with his provider whether he is to continue Cardura or discontinue. Dustin [name] understood this medication was to be discontinued. His discharge instructions indicate that he is to continue Cardura. I notified the primary care provider office of same. In addition, I called our pharmacy manager today to refer Dustin [name] to our pharmacy and medication management/assistance program.   Dustin [name] related today that he is struggling financially and would benefit from assistance with community resources. I will refer him to our [name].   Plan: follow up with provider office, [name], [name]. I will reach out  to [name] again on Monday. Dustin [name] agreed today that we will plan for a home visit on the week of 12/21/15.    Marja Kayslisa Gilboy MHA,BSN,RN,CCM [facility] Care Management  [phone]

## 2015-12-10 NOTE — Addendum Note (Signed)
Addended by: Nunzio CoryGILBOY, ALISA G on: 12/10/2015 04:40 PM   Modules accepted: Orders

## 2015-12-13 ENCOUNTER — Other Ambulatory Visit: Payer: Self-pay | Admitting: *Deleted

## 2015-12-13 ENCOUNTER — Encounter: Payer: Self-pay | Admitting: Licensed Clinical Social Worker

## 2015-12-13 ENCOUNTER — Other Ambulatory Visit: Payer: Self-pay | Admitting: Licensed Clinical Social Worker

## 2015-12-13 NOTE — Patient Outreach (Signed)
[name]) Care Management Outreach/Week #2  12/13/2015  Dustin CoriaDennis R [name] 22-Apr-1956 [id number]  I reached out to Dustin [name] this morning for continued transition of care assessment. Dustin [name] reports he is breathing comfortably today and denies increased shortness of breath, cough, fever, or other new or worsened symptom.   Dustin [name] plans to go to his scheduled primary care provider post hospital follow up appointment tomorrow. I advised that he take all his medications and his hospital discharge paperwork with him to the appointment. I reminded Dustin [name] that his primary care office is already working diligently on patient assistance for medication needs and that our pharmacy team has also been consulted and will help with medication assistance in collaboration with his provider office. In addition, I reminded Dustin [name] that our social work team will reach out to him to provide assessment of financial needs and will offer recommendations regarding resources and financial assistance.   Plan: Dustin [name] agreed to have me call him on Wednesday to follow up on his provider appointment, medication needs/concerns, and any other health care questions or needs he may have. I will schedule a face to face appointment with Dustin [name] at this time.    Dustin [name] MHA,BSN,RN,CCM [facility] Care [facility]  [phone]

## 2015-12-13 NOTE — Patient Outreach (Signed)
Assessment:  CSW received referral on Dustin [name]. CSW completed chart review on client on 12/13/15. RN Dustin [name] has been providing [facility] nursing support for client.  Client sees Dustin. Dustin [name] as client's primary doctor. CSW called client on 12/13/15 and spoke via phone with client. CSW verified client identity. CSW introduced self to client.  Client said family members help transport client to and from client scheduled medical appointments. Client said he has appointment with Dustin [name] on 12/14/15. CSW and client updated client assessment information.  Client has already completed [facility] consent form.  Client said he is looking forward to working with [facility] representative. He said it is challenging to pay for some of his prescribed medications.  CSW and client completed [facility] Financial Assessment Form for client on 12/13/15. Client said his rent payment, his car payment, and his utility payment are the 3 largest monthly payments for him. He said that these 3 payments along with smaller monthly payment smake it difficult for him to pay monthly bills due. He said he had gone to Department of Social Services one time in the past to seek help with a past due utility bill. He said he has not gone to local food pantries. He said he has not yet accessed other communoity resources. Client said due to stress issues, he is staying at present at home of his son and daughter in law to recuperate. He said he thinks he will stay at son's home for a short period of time.  He said normally he resides with his wife and daughter at his home. He said he rents his home. He said his daughter has two small children (preschool age) who reside with her at client's home.  Client also said he thought he owed some money to one area hospital. He said insurance company is working with hospital to determine cost/reimbursement to hospital through [employer] insurance. He said he has [employer] insurance. He does not have Medicaid.   Client and CSW discussed client care plan. Client agreed to goal of communicating with CSW in next 30 days to discuss community resources of assistance for client.  CSW thanked client for phone conversation with CSW on 12/13/15.   Plan:   Client to communicate with CSW in the next 30 days to discuss community resources of assistance for client. CSW to call client next week to discuss financial needs of client. CSW to collaborate with RN Dustin KaysAlisa [name] in monitoring needs of client.    Dustin [name] MSW, [name] Licensed Clinical Social Worker [facility] Care Management [phone]

## 2015-12-15 ENCOUNTER — Other Ambulatory Visit: Payer: Self-pay | Admitting: *Deleted

## 2015-12-15 DIAGNOSIS — J441 Chronic obstructive pulmonary disease with (acute) exacerbation: Secondary | ICD-10-CM | POA: Diagnosis not present

## 2015-12-15 DIAGNOSIS — Z720 Tobacco use: Secondary | ICD-10-CM | POA: Diagnosis not present

## 2015-12-15 DIAGNOSIS — I2510 Atherosclerotic heart disease of native coronary artery without angina pectoris: Secondary | ICD-10-CM | POA: Diagnosis not present

## 2015-12-15 DIAGNOSIS — I959 Hypotension, unspecified: Secondary | ICD-10-CM | POA: Diagnosis not present

## 2015-12-15 DIAGNOSIS — I5032 Chronic diastolic (congestive) heart failure: Secondary | ICD-10-CM | POA: Diagnosis not present

## 2015-12-15 DIAGNOSIS — R0789 Other chest pain: Secondary | ICD-10-CM | POA: Diagnosis not present

## 2015-12-15 DIAGNOSIS — R000 Tachycardia, unspecified: Secondary | ICD-10-CM | POA: Diagnosis not present

## 2015-12-15 DIAGNOSIS — Z9981 Dependence on supplemental oxygen: Secondary | ICD-10-CM | POA: Diagnosis not present

## 2015-12-15 NOTE — Patient Outreach (Signed)
[name]) Care Management  12/15/2015  Hershal CoriaDennis R Mejorado February 21, 1956 [id number]  I reached out today to Mr. Wyly to follow up on his primary care provider/post hospital discharge appointment scheduled for yesterday. [name] told me he had the flu and had to cancel yesterday's appointment and reschedule for tomorrow. He said this was the first year he didn't get the flu shot and he'd had the flu twice. He stated "I won't miss that shot again!"  [name] denies worsened shortness of breath. States subjectively he has had "low" fever. [name] said [facility] today and checked his blood pressure finding it "a little lower than usual" (< 110/56) and his pulse 115. [name] denied chest pain, light headedness, dizziness, or any other new or worsened symptom other than general malaise and mild fever.   [name] will see Dr. Welton FlakesKhan in the office tomorrow and I will follow up with him on Friday by phone.    Marja Kayslisa Inola Lisle MHA,BSN,RN,CCM [facility] Care Management  [phone]

## 2015-12-16 DIAGNOSIS — I5032 Chronic diastolic (congestive) heart failure: Secondary | ICD-10-CM | POA: Diagnosis not present

## 2015-12-16 DIAGNOSIS — J441 Chronic obstructive pulmonary disease with (acute) exacerbation: Secondary | ICD-10-CM | POA: Diagnosis not present

## 2015-12-16 DIAGNOSIS — J9611 Chronic respiratory failure with hypoxia: Secondary | ICD-10-CM | POA: Diagnosis not present

## 2015-12-16 DIAGNOSIS — J15212 Pneumonia due to Methicillin resistant Staphylococcus aureus: Secondary | ICD-10-CM | POA: Diagnosis not present

## 2015-12-17 ENCOUNTER — Other Ambulatory Visit: Payer: Self-pay | Admitting: *Deleted

## 2015-12-17 NOTE — Patient Outreach (Signed)
[name]) Care Management  12/17/2015  Dustin CoriaDennis R [name] August 12, 1956 [id number]  Brief follow up with Dustin [name] today. He was napping so only told me that he'd had a good visit with Dr. Park BreedKahn yesterday, that he'd reviewed all his medications, and he was feeling better from having had the flu. Dustin [name] agreed to a home visit with me on Thursday at [facility].    Alisa Gilboy [facility] Hospital[facility]HA,BSN,RN,CCM [facility] Care Management  [phone]

## 2015-12-20 ENCOUNTER — Telehealth: Payer: Self-pay | Admitting: *Deleted

## 2015-12-20 DIAGNOSIS — I2510 Atherosclerotic heart disease of native coronary artery without angina pectoris: Secondary | ICD-10-CM | POA: Diagnosis not present

## 2015-12-20 DIAGNOSIS — R0789 Other chest pain: Secondary | ICD-10-CM | POA: Diagnosis not present

## 2015-12-20 DIAGNOSIS — R000 Tachycardia, unspecified: Secondary | ICD-10-CM | POA: Diagnosis not present

## 2015-12-20 DIAGNOSIS — J441 Chronic obstructive pulmonary disease with (acute) exacerbation: Secondary | ICD-10-CM | POA: Diagnosis not present

## 2015-12-20 DIAGNOSIS — Z9981 Dependence on supplemental oxygen: Secondary | ICD-10-CM | POA: Diagnosis not present

## 2015-12-20 DIAGNOSIS — Z720 Tobacco use: Secondary | ICD-10-CM | POA: Diagnosis not present

## 2015-12-20 DIAGNOSIS — I959 Hypotension, unspecified: Secondary | ICD-10-CM | POA: Diagnosis not present

## 2015-12-20 DIAGNOSIS — I5032 Chronic diastolic (congestive) heart failure: Secondary | ICD-10-CM | POA: Diagnosis not present

## 2015-12-20 NOTE — Telephone Encounter (Signed)
Left voicemail for patient notifyng them that it is time to schedule lung cancer followup CT scan. Instructed patient to call back to verify information prior to the scan being scheduled.

## 2015-12-22 ENCOUNTER — Other Ambulatory Visit: Payer: Self-pay | Admitting: Pharmacist

## 2015-12-22 DIAGNOSIS — I2510 Atherosclerotic heart disease of native coronary artery without angina pectoris: Secondary | ICD-10-CM | POA: Diagnosis not present

## 2015-12-22 DIAGNOSIS — Z9981 Dependence on supplemental oxygen: Secondary | ICD-10-CM | POA: Diagnosis not present

## 2015-12-22 DIAGNOSIS — Z720 Tobacco use: Secondary | ICD-10-CM | POA: Diagnosis not present

## 2015-12-22 DIAGNOSIS — I5032 Chronic diastolic (congestive) heart failure: Secondary | ICD-10-CM | POA: Diagnosis not present

## 2015-12-22 DIAGNOSIS — I959 Hypotension, unspecified: Secondary | ICD-10-CM | POA: Diagnosis not present

## 2015-12-22 DIAGNOSIS — R0789 Other chest pain: Secondary | ICD-10-CM | POA: Diagnosis not present

## 2015-12-22 DIAGNOSIS — J441 Chronic obstructive pulmonary disease with (acute) exacerbation: Secondary | ICD-10-CM | POA: Diagnosis not present

## 2015-12-22 DIAGNOSIS — R000 Tachycardia, unspecified: Secondary | ICD-10-CM | POA: Diagnosis not present

## 2015-12-22 NOTE — Patient Outreach (Signed)
[name]) Care Management  [facility]   12/22/2015  Dustin [name] 1955-10-02 [id number]  Subjective:  Patient was referred to [facility] Pharmacist for medication assistance with his inhalers.    Pharmacist called patient and verified patient name and date of birth on phone, explained purpose of call.  Patient was admitted at [facility] 3/14-3/20/17 and pharmacist completed a medication reconciliation with patient over the phone per patient report of his medications he is presently taking.    Patient reports he has MA-PDP through [facility].    Objective:   Current Medications: Current Outpatient Prescriptions  Medication Sig Dispense Refill  . albuterol (PROVENTIL) (2.5 MG/3ML) 0.083% nebulizer solution Take 3 mLs (2.5 mg total) by nebulization every 3 (three) hours as needed for shortness of breath. 75 mL 12  . Albuterol Sulfate (PROAIR RESPICLICK) 108 (90 BASE) MCG/ACT AEPB Inhale 2 puffs into the lungs every 4 (four) hours as needed (for shortness of breath). Reported on 12/10/2015    . ALPRAZolam (XANAX) 1 MG tablet Take 1 tablet (1 mg total) by mouth 3 (three) times daily as needed for anxiety. 15 tablet 0  . budesonide (PULMICORT) 0.25 MG/2ML nebulizer solution Take 2 mLs (0.25 mg total) by nebulization every 6 (six) hours. 60 mL 12  . doxazosin (CARDURA) 4 MG tablet Take 4 mg by mouth daily. Reported on 12/10/2015    . escitalopram (LEXAPRO) 20 MG tablet Take 20 mg by mouth at bedtime.     . gabapentin (NEURONTIN) 600 MG tablet Take 600 mg by mouth 3 (three) times daily.    Marland Kitchen ipratropium-albuterol (DUONEB) 0.5-2.5 (3) MG/3ML SOLN Take 3 mLs by nebulization 4 (four) times daily as needed (for shortness of breath).    . mometasone-formoterol (DULERA) 200-5 MCG/ACT AERO Inhale 2 puffs into the lungs 2 (two) times daily.    . predniSONE (DELTASONE) 10 MG tablet Take 5 tablets (50 mg total) by mouth daily with breakfast. (Patient not taking:  Reported on 12/10/2015) 15 tablet 0  . theophylline (UNIPHYL) 400 MG 24 hr tablet Take 400 mg by mouth 2 (two) times daily.     No current facility-administered medications for this visit.    Functional Status: In your present state of health, do you have any difficulty performing the following activities: 12/13/2015 12/13/2015  Hearing? N N  Vision? Y N  Difficulty concentrating or making decisions? Y N  Walking or climbing stairs? Y Y  Dressing or bathing? N N  Doing errands, shopping? Malvin Johns  Preparing Food and eating ? N N  Using the Toilet? N N  In the past six months, have you accidently leaked urine? N N  Do you have problems with loss of bowel control? N N  Managing your Medications? Y Y  Managing your Finances? Malvin Johns  Housekeeping or managing your Housekeeping? Malvin Johns    Fall/Depression Screening: PHQ 2/9 Scores 12/13/2015  PHQ - 2 Score 2  PHQ- 9 Score 8    Assessment:  Patient reports that he was able to obtain a free 30 day coupon card for [facility] for this month.  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    Review of preferred drug list for patient's plan shows that Elwin Sleight is Tier 4, with a $95 co-pay/30 days and the plan applies a deductible to Tier 4 and 5 medications.  If patient is not able to obtain manufacturer patient assistance  for [facility], a formulary alternative may need to be explored if therapeutically appropriate.    Patient reports that the presently has ipratropium/albuterol nebulization and Dulera in his possession.    Drugs sorted by system:  Neurologic/Psychologic: -alprazolam  -escitalopram   Cardiovascular: -doxazosin  Pulmonary/Allergy: -Albuterol MDI -Albuterol nebulization -Budesonide nebulization---patient has not picked up from pharmacy as of 12/23/15 -ipratropium/albuterol nebulization -mometasone/formoterol (Dulera)---patient filled 12/14/15 with 30-day  free coupon card -theophylline  Miscellaneous: -prednisone---patient reports he completed this  -gabapentin   Plan:  1) Pharmacist [facility] (PCP) and left message 12/23/15 [phone] to better understand what patient assistance office has already started.    2) Pharmacist will work to follow-up status of patient assistance applications with PCP office and update patient when information is available.

## 2015-12-23 ENCOUNTER — Other Ambulatory Visit: Payer: Self-pay | Admitting: Licensed Clinical Social Worker

## 2015-12-23 ENCOUNTER — Other Ambulatory Visit: Payer: Self-pay | Admitting: *Deleted

## 2015-12-23 ENCOUNTER — Encounter: Payer: Self-pay | Admitting: *Deleted

## 2015-12-23 NOTE — Patient Outreach (Signed)
[name]) Care Management   12/23/2015  Dustin [name] Jan 09, 1956 [id number]  Dustin [name] is an 60 y.o. male who was admitted to the hospital from 12/06/15 to 12/10/15 with acute on chronic hypoxic respiratory failure secondary to COPD flare requiring mechanical ventilation. Dustin [name] has history of CHF w/ acute episode during his hospital admission, and pulmonary hypertension.   As a COPD Gold IV patient, Dustin [name] was referred to [facility] Care Management from the hospital for transition of care services and COPD disease management and care coordination. He is being actively followed in the community for nursing case management, clinical social work, and pharmacy services. Today, I am seeing Dustin [name] at home.   Subjective: "I'm doing some better but I've had some low fever around 99 and I'm starting to cough up green again."  Objective:  BP 118/80 mmHg  Pulse 96  SpO2 98%  Review of Systems  Constitutional: Positive for fever and malaise/fatigue.       Low grade fever 99-99.7 noted by home health nurse  HENT: Negative.   Eyes: Negative.   Respiratory: Positive for cough, sputum production and shortness of breath.        Persistent intermittent mild to moderate cough productive over the last few days of green sputum; shortness of breath with activity as at baseline  Cardiovascular: Negative for chest pain, palpitations, orthopnea and leg swelling.  Gastrointestinal: Negative.   Genitourinary: Negative.   Musculoskeletal: Positive for myalgias. Negative for falls.  Skin:       Mild scattered bruising over arms  Neurological: Negative.   Psychiatric/Behavioral: Negative.     Physical Exam  Constitutional: He is oriented to person, place, and time. He appears well-developed and well-nourished. He is active.  Cardiovascular: Regular rhythm, S1 normal, S2 normal and normal heart sounds.  Tachycardia present.   Respiratory: Effort normal. No accessory muscle  usage. No respiratory distress. He has decreased breath sounds in the right upper field, the right middle field, the right lower field, the left upper field, the left middle field and the left lower field. He has no wheezes. He has no rhonchi. He has no rales.  GI: Soft. Bowel sounds are normal.  Neurological: He is alert and oriented to person, place, and time.  Skin: Skin is warm, dry and intact.  Psychiatric: He has a normal mood and affect. His speech is normal and behavior is normal. Judgment and thought content normal. Cognition and memory are normal.    Encounter Medications:   Outpatient Encounter Prescriptions as of 12/23/2015  Medication Sig Note  . albuterol (PROVENTIL) (2.5 MG/3ML) 0.083% nebulizer solution Take 3 mLs (2.5 mg total) by nebulization every 3 (three) hours as needed for shortness of breath. (Patient not taking: Reported on 12/23/2015)   . Albuterol Sulfate (PROAIR RESPICLICK) 108 (90 BASE) MCG/ACT AEPB Inhale 2 puffs into the lungs every 4 (four) hours as needed (for shortness of breath). Reported on 12/10/2015   . ALPRAZolam (XANAX) 1 MG tablet Take 1 tablet (1 mg total) by mouth 3 (three) times daily as needed for anxiety. 12/10/2015: States not always taking TID; sometimes few times daily  . budesonide (PULMICORT) 0.25 MG/2ML nebulizer solution Take 2 mLs (0.25 mg total) by nebulization every 6 (six) hours. (Patient not taking: Reported on 12/23/2015) 12/23/2015: Not picked up from [facility] yet as of 12/23/15.  Marland Kitchen doxazosin (CARDURA) 4 MG tablet Take 4 mg by mouth daily. Reported on 12/10/2015 12/10/2015: Not taking; per patient report,  this medication was discontinued at the time of his admission to the hospital  . escitalopram (LEXAPRO) 20 MG tablet Take 20 mg by mouth at bedtime.    . gabapentin (NEURONTIN) 600 MG tablet Take 600 mg by mouth 3 (three) times daily. 12/10/2015: Able to verbalize change in dose  . ipratropium-albuterol (DUONEB) 0.5-2.5 (3) MG/3ML SOLN Take 3 mLs by  nebulization 4 (four) times daily as needed (for shortness of breath). 12/10/2015: Having difficulty affording; spoke with Tawanna Cooler at [facility] who reports she has started manufacturer assistance application  . mometasone-formoterol (DULERA) 200-5 MCG/ACT AERO Inhale 2 puffs into the lungs 2 (two) times daily. 12/10/2015: Having difficulty affording; spoke with Tawanna Cooler at [facility] who has started patient assistance application process  . predniSONE (DELTASONE) 10 MG tablet Take 5 tablets (50 mg total) by mouth daily with breakfast. (Patient not taking: Reported on 12/10/2015) 12/10/2015: completed  . theophylline (UNIPHYL) 400 MG 24 hr tablet Take 400 mg by mouth 2 (two) times daily.    Assessment:  [name] is a 60 year old male patient living with his daughter in [place] who recently was hospitalized for acute on chronic respiratory failure requiring mechanical ventilation secondary to COPD exacerbation. Dustin [name] is being followed in the community by [facility] for nursing and physical therapy.   Chronic Health Condition (COPD) Self Health management concerns - Dustin [name] has a fair knowledge and understanding of his COPD diagnosis. However, he needs education and reinforcement in the area of medication management, nutrition support, and planning for long term management.   Dustin [name] breath sounds were diminished but clear today. However, he report a worsening persistent cough productive of green sputum accompanied by low grade fever (99-99.7) over the last 3 days. His o2 Sat on 3l/[place] is 95-96%. He is taking his medications but has not picked up his newly prescribed ipratropium-albuterol (DUONEB) 0.5-2.5 solution and is continuing to use plain albuterol neb solution. His family member is to pick up his prescriptions today.   I reached out to Dr. Santo Held office to report Dustin [name]'s respiratory symptoms. My call was kindly return by the clinic nurse who  contacted the PA who in turn prescribed oral antibiotics for Dustin [name]. I returned a call to Dustin [name] to provide this information and instructions. The clinic nurse is going to send an order for sputum collection to [place] home health so the visiting nurse can collect.   Dustin [name] and I reviewed COPD exacerbation early symptoms and discussed an action plan designed to help him avoid acute exacerbation. I provided him with print education materials and a 24/7 on call nurse line where he can reach a live registered nurse 365 days/year for non emergency help if he has a question or need if his provider office is closed or he is unable to reach his home health nurse or me.   I discussed with Mr. Solanki the importance of good nutrition and adequate caloric intake for patients with COPD and he eagerly received this information.   Medication Management Concerns   Dulera/Spiriva -  last week, Dustin [name] indicated that he was unable to afford his Dulera and Spiriva. Dr Santo Held office team had already been working on helping Dustin [name] with patient assistance applications. I referred Dustin [name] to our pharmacy team provide support and collaborate. [name], Pharmacist with [facility] is following Dustin [name] closely.   Cardura - Dustin [name] explained to me when we spoke last week that  his PA at Dr. Milta Deiters office "took him off" the Cardura he had been taking. During Dustin [name] hospitalization, he received Cardura and was instructed to take it upon discharge. However, when I spoke with Dustin [name] last week and since his recent office visit, he reports that he NOT to take the Cardura. BP today taken manually by me is 118/80 with pulse 98 (RRR) but Mr. Kief reports that he has had bp's in the 160's/90's in the last week with heart rate in the 110's at times. I provided Mr. Minchey with a bp, pulse, and O2 documentation log and asked him to monitor and record these vital signs daily and report SBP's >  160 x 3 consecutively and/or DBP's > 90 x 3 consecutively.   Plan:    Mr. Stuckey will be seen at home tomorrow (Friday) by his home health nurse from [place].    Mr. Craton will have family member pick up remaining prescriptions in [place] at [facility] on Friday.    Mr. Vanderwall with measure and document BP, Pulse, O2 Sat daily in his blue THN CM notebook documentation log and will                 call for findings outside established parameters.    Mr. Cambre will continue working with [name] Lorna Few and [name] PharmD with [facility] Care Management.    I will reach out to Mr. Damaso by phone next week.   THN CM Care Plan Problem One        Most Recent Value   Care Plan Problem One  Medication Management Concerns   Role Documenting the Problem One  Care Management Coordinator   Care Plan for Problem One  Active   THN Long Term Goal (31-90 days)  Over the next 31 days, patient will verbalize understanding of all medications and have all needed medications on hand   THN Long Term Goal Start Date  12/10/15   Interventions for Problem One Long Term Goal  Medication Review,  Pharmacy referral,  provider outreach   [facility] CM Short Term Goal #1 (0-30 days)  Over the next 3 days, patient will discuss medication concerns/needs with [facility] CM Pharmacist   [facility] CM Short Term Goal #1 Start Date  12/10/15   Interventions for Short Term Goal #1  Pharmacy referral made,  explained pharmacy services to patient   [facility] CM Short Term Goal #2 (0-30 days)  Over the next 7 days, patient will discuss medications with provider and specifically discuss Cardura   THN CM Short Term Goal #2 Start Date  12/10/15   Interventions for Short Term Goal #2  Notified primary care provider of patient's understanding that he is not to take Cardura any longer     [facility] CM Care Plan Problem Two        Most Recent Value   Care Plan Problem Two  Chronic Health Condition (COPD) Self Health Mangaement Deficits   Role  Documenting the Problem Two  Care Management Coordinator   Care Plan for Problem Two  Active   Interventions for Problem Two Long Term Goal   Utilizing teachback method, reviewed with patient the importance of established and written plan of care for self management of COPD   THN Long Term Goal (31-90) days  Over the next 60 days, patient will verbalize understanding of plan for self health management of COPD   THN Long Term Goal Start Date  12/23/15   [facility] CM Short  Term Goal #1 (0-30 days)  Over the next 30 days, patient will verbalize understanding of signs and symptoms of worsening COPD or respiratory problems   THN CM Short Term Goal #1 Start Date  12/23/15   Interventions for Short Term Goal #2   Utilizing teachback method and blue THN Care Management Stop Light tool, reviewed with patient signs and symptoms of worsening COPD/respiratory condition   THN CM Short Term Goal #2 (0-30 days)  Over the next 30 days, patient will verbalize understanding of action plan for COPD, including who to call for help   [facility] CM Short Term Goal #2 Start Date  12/23/15   Interventions for Short Term Goal #2  Utilizing teachback method and [facility] Blue Notebook, helped patient begin developing action plan for management of COPD exacerbation symtpoms   THN CM Short Term Goal #3 (0-30 days)  Over the next 30 days, patient will verbalize understanding of COPD medications   THN CM Short Term Goal #3 Start Date  12/23/15   Interventions for Short Term Goal #3  medication review and education      Marja Kayslisa Miklo [facility],BSN,RN,CCM [facility] Care Management  [phone]

## 2015-12-23 NOTE — Patient Outreach (Signed)
Pharmacist spoke with Kyla Balzarineatiana at [facility] (PCP office) and she verified that office is already completing paperwork for [employer] (Spiriva) and [employer] Elwin Sleight(Dulera) patient assistance.  Kyla Balzarineatiana reports that patient received the attestation/appeal form from [employer] and needs to complete it yet.    Pharmacist called patient and let patient know that PCP office is working on patient assistance paperwork.  Patient was provided with phone number for [employer] patient assistance so he can request another attestation/appeal form if he is not able to find the one that was mailed to him already.  [facility] should he have any questions.  Pharmacist will call patient in about 2 weeks to determine if he has received patient assistance yet.    Tommye StandardKevin Lilla Callejo, PharmD, [facility]ACP Clinical Pharmacist [name] [phone]

## 2015-12-23 NOTE — Patient Outreach (Signed)
Assessment:  CSW called client on 12/23/15 and spoke via phone with client on 12/23/15. CSW verified client identity. CSW and client spoke of client needs.  Client said that [name] NorlanderGentiva RN has visited with him at home as scheduled. Client said he has also been receiving [facility] health [facility]  Client said that RN Marja Kayslisa Gilboy had conducted home visit with client on 12/23/15. Client said he is scheduled to have medical appointment with Dr. Kendall FlackSadat on Feb 07, 2016.  He said he fatigues occasionally. He said he is residing at present with his son and daughter in law.  He uses oxygen 24/7 to help him with breathing. He said he uses oxygen at 2 liters via nasal canula continuously.  He said family members help transport him to and from his scheduled medical appointments.  He said he is trying to take medications as prescribed. He has also been receiving support [facility]. Client has financial challenges. He and CSW spoke of client financial challenges.  Client said he receives Social Security benefit monthly.  He said that his rent payment, his car payment and electric bill payment require a good part of his monthly income.  Client said he has tried to access some local food pantries for help occasionally. Client said he went to [employer] one year ago and received some help with utility assistance.  Client has [employer] insurance.  Client said he does not have Medicaid but believes he earns too much to qualify for Medicaid. Client said he has received some help from a local church in the past. Client said he does not receive Food Stamps benefit.  CSW talked with client about client applying for Food Stamps benefit.  CSW spoke with client about process of applying for Food Stamps benefit through local Department of Social Services.  Client and CSW spoke of marital stress issues for client. Client said he and his spouse have argued about  current financial stressors. He said he has had several hospitalizations and has limited monthly income. He said he thinks that financial challenges are causing some stress between himself and his spouse.  He said he is staying temporarily with his son and daughter in law to recover from his recent hospitalization.  CSW thanked EmpireDennis for phone call with CSW on 12/23/15.  CSW encouraged [name] to call CSW at [phone] to discuss social work needs of client.   Plan:  Client to communicate with CSW in next 30 days to discuss community resources of assistance to client. CSW to call client in three weeks to assess client needs at that time.  Kelton PillarMichael S.Maheen Cwikla MSW, [name] Licensed Clinical Social Worker [facility] Care Management [phone]

## 2015-12-24 ENCOUNTER — Encounter: Payer: Self-pay | Admitting: *Deleted

## 2015-12-24 DIAGNOSIS — R000 Tachycardia, unspecified: Secondary | ICD-10-CM | POA: Diagnosis not present

## 2015-12-24 DIAGNOSIS — R0789 Other chest pain: Secondary | ICD-10-CM | POA: Diagnosis not present

## 2015-12-24 DIAGNOSIS — Z9981 Dependence on supplemental oxygen: Secondary | ICD-10-CM | POA: Diagnosis not present

## 2015-12-24 DIAGNOSIS — Z720 Tobacco use: Secondary | ICD-10-CM | POA: Diagnosis not present

## 2015-12-24 DIAGNOSIS — J441 Chronic obstructive pulmonary disease with (acute) exacerbation: Secondary | ICD-10-CM | POA: Diagnosis not present

## 2015-12-24 DIAGNOSIS — I959 Hypotension, unspecified: Secondary | ICD-10-CM | POA: Diagnosis not present

## 2015-12-24 DIAGNOSIS — I5032 Chronic diastolic (congestive) heart failure: Secondary | ICD-10-CM | POA: Diagnosis not present

## 2015-12-24 DIAGNOSIS — I2510 Atherosclerotic heart disease of native coronary artery without angina pectoris: Secondary | ICD-10-CM | POA: Diagnosis not present

## 2015-12-26 DIAGNOSIS — R000 Tachycardia, unspecified: Secondary | ICD-10-CM | POA: Diagnosis not present

## 2015-12-26 DIAGNOSIS — Z720 Tobacco use: Secondary | ICD-10-CM | POA: Diagnosis not present

## 2015-12-26 DIAGNOSIS — I5032 Chronic diastolic (congestive) heart failure: Secondary | ICD-10-CM | POA: Diagnosis not present

## 2015-12-26 DIAGNOSIS — J441 Chronic obstructive pulmonary disease with (acute) exacerbation: Secondary | ICD-10-CM | POA: Diagnosis not present

## 2015-12-26 DIAGNOSIS — I2510 Atherosclerotic heart disease of native coronary artery without angina pectoris: Secondary | ICD-10-CM | POA: Diagnosis not present

## 2015-12-26 DIAGNOSIS — I959 Hypotension, unspecified: Secondary | ICD-10-CM | POA: Diagnosis not present

## 2015-12-26 DIAGNOSIS — R0789 Other chest pain: Secondary | ICD-10-CM | POA: Diagnosis not present

## 2015-12-26 DIAGNOSIS — Z9981 Dependence on supplemental oxygen: Secondary | ICD-10-CM | POA: Diagnosis not present

## 2015-12-27 DIAGNOSIS — J471 Bronchiectasis with (acute) exacerbation: Secondary | ICD-10-CM | POA: Diagnosis not present

## 2015-12-27 DIAGNOSIS — Z9981 Dependence on supplemental oxygen: Secondary | ICD-10-CM | POA: Diagnosis not present

## 2015-12-27 DIAGNOSIS — I2510 Atherosclerotic heart disease of native coronary artery without angina pectoris: Secondary | ICD-10-CM | POA: Diagnosis not present

## 2015-12-27 DIAGNOSIS — I959 Hypotension, unspecified: Secondary | ICD-10-CM | POA: Diagnosis not present

## 2015-12-27 DIAGNOSIS — R000 Tachycardia, unspecified: Secondary | ICD-10-CM | POA: Diagnosis not present

## 2015-12-27 DIAGNOSIS — R0789 Other chest pain: Secondary | ICD-10-CM | POA: Diagnosis not present

## 2015-12-27 DIAGNOSIS — J441 Chronic obstructive pulmonary disease with (acute) exacerbation: Secondary | ICD-10-CM | POA: Diagnosis not present

## 2015-12-27 DIAGNOSIS — Z720 Tobacco use: Secondary | ICD-10-CM | POA: Diagnosis not present

## 2015-12-27 DIAGNOSIS — I5032 Chronic diastolic (congestive) heart failure: Secondary | ICD-10-CM | POA: Diagnosis not present

## 2015-12-29 DIAGNOSIS — R0789 Other chest pain: Secondary | ICD-10-CM | POA: Diagnosis not present

## 2015-12-29 DIAGNOSIS — I2510 Atherosclerotic heart disease of native coronary artery without angina pectoris: Secondary | ICD-10-CM | POA: Diagnosis not present

## 2015-12-29 DIAGNOSIS — R000 Tachycardia, unspecified: Secondary | ICD-10-CM | POA: Diagnosis not present

## 2015-12-29 DIAGNOSIS — I5032 Chronic diastolic (congestive) heart failure: Secondary | ICD-10-CM | POA: Diagnosis not present

## 2015-12-29 DIAGNOSIS — Z720 Tobacco use: Secondary | ICD-10-CM | POA: Diagnosis not present

## 2015-12-29 DIAGNOSIS — J441 Chronic obstructive pulmonary disease with (acute) exacerbation: Secondary | ICD-10-CM | POA: Diagnosis not present

## 2015-12-29 DIAGNOSIS — I959 Hypotension, unspecified: Secondary | ICD-10-CM | POA: Diagnosis not present

## 2015-12-29 DIAGNOSIS — Z9981 Dependence on supplemental oxygen: Secondary | ICD-10-CM | POA: Diagnosis not present

## 2015-12-31 DIAGNOSIS — I5032 Chronic diastolic (congestive) heart failure: Secondary | ICD-10-CM | POA: Diagnosis not present

## 2015-12-31 DIAGNOSIS — I2510 Atherosclerotic heart disease of native coronary artery without angina pectoris: Secondary | ICD-10-CM | POA: Diagnosis not present

## 2015-12-31 DIAGNOSIS — J441 Chronic obstructive pulmonary disease with (acute) exacerbation: Secondary | ICD-10-CM | POA: Diagnosis not present

## 2015-12-31 DIAGNOSIS — I959 Hypotension, unspecified: Secondary | ICD-10-CM | POA: Diagnosis not present

## 2015-12-31 DIAGNOSIS — R0789 Other chest pain: Secondary | ICD-10-CM | POA: Diagnosis not present

## 2015-12-31 DIAGNOSIS — R000 Tachycardia, unspecified: Secondary | ICD-10-CM | POA: Diagnosis not present

## 2015-12-31 DIAGNOSIS — Z9981 Dependence on supplemental oxygen: Secondary | ICD-10-CM | POA: Diagnosis not present

## 2015-12-31 DIAGNOSIS — Z720 Tobacco use: Secondary | ICD-10-CM | POA: Diagnosis not present

## 2016-01-03 DIAGNOSIS — Z720 Tobacco use: Secondary | ICD-10-CM | POA: Diagnosis not present

## 2016-01-03 DIAGNOSIS — R000 Tachycardia, unspecified: Secondary | ICD-10-CM | POA: Diagnosis not present

## 2016-01-03 DIAGNOSIS — Z9981 Dependence on supplemental oxygen: Secondary | ICD-10-CM | POA: Diagnosis not present

## 2016-01-03 DIAGNOSIS — I959 Hypotension, unspecified: Secondary | ICD-10-CM | POA: Diagnosis not present

## 2016-01-03 DIAGNOSIS — J441 Chronic obstructive pulmonary disease with (acute) exacerbation: Secondary | ICD-10-CM | POA: Diagnosis not present

## 2016-01-03 DIAGNOSIS — R0789 Other chest pain: Secondary | ICD-10-CM | POA: Diagnosis not present

## 2016-01-03 DIAGNOSIS — I2510 Atherosclerotic heart disease of native coronary artery without angina pectoris: Secondary | ICD-10-CM | POA: Diagnosis not present

## 2016-01-03 DIAGNOSIS — I5032 Chronic diastolic (congestive) heart failure: Secondary | ICD-10-CM | POA: Diagnosis not present

## 2016-01-04 ENCOUNTER — Other Ambulatory Visit: Payer: Self-pay | Admitting: *Deleted

## 2016-01-04 DIAGNOSIS — R000 Tachycardia, unspecified: Secondary | ICD-10-CM | POA: Diagnosis not present

## 2016-01-04 DIAGNOSIS — Z9981 Dependence on supplemental oxygen: Secondary | ICD-10-CM | POA: Diagnosis not present

## 2016-01-04 DIAGNOSIS — I5032 Chronic diastolic (congestive) heart failure: Secondary | ICD-10-CM | POA: Diagnosis not present

## 2016-01-04 DIAGNOSIS — J441 Chronic obstructive pulmonary disease with (acute) exacerbation: Secondary | ICD-10-CM | POA: Diagnosis not present

## 2016-01-04 DIAGNOSIS — I959 Hypotension, unspecified: Secondary | ICD-10-CM | POA: Diagnosis not present

## 2016-01-04 DIAGNOSIS — Z720 Tobacco use: Secondary | ICD-10-CM | POA: Diagnosis not present

## 2016-01-04 DIAGNOSIS — R0789 Other chest pain: Secondary | ICD-10-CM | POA: Diagnosis not present

## 2016-01-04 DIAGNOSIS — I2510 Atherosclerotic heart disease of native coronary artery without angina pectoris: Secondary | ICD-10-CM | POA: Diagnosis not present

## 2016-01-04 NOTE — Patient Outreach (Signed)
[name](THN) Care Management  [facility] Care Manager  01/04/2016   Dustin [name] 10/02/1955 [id number]   Dustin CoriaDennis R [name] is an 60 y.o. male who was admitted to the hospital from 12/06/15 to 12/10/15 with acute on chronic hypoxic respiratory failure secondary to COPD flare requiring mechanical ventilation. Dustin [name] has history of CHF w/ acute episode during his hospital admission, and pulmonary hypertension.   As a COPD Gold IV patient, Dustin [name] [facility] for transition of care services and COPD disease management and care coordination. He is being actively followed in the community for nursing case management, clinical social work, and pharmacy services.     Subjective: "I feel better but I'm still worried about getting my inhalers."  Encounter Medications:  Outpatient Encounter Prescriptions as of 01/04/2016  Medication Sig Note  . albuterol (PROVENTIL) (2.5 MG/3ML) 0.083% nebulizer solution Take 3 mLs (2.5 mg total) by nebulization every 3 (three) hours as needed for shortness of breath. (Patient not taking: Reported on 12/23/2015)   . Albuterol Sulfate (PROAIR RESPICLICK) 108 (90 BASE) MCG/ACT AEPB Inhale 2 puffs into the lungs every 4 (four) hours as needed (for shortness of breath). Reported on 12/10/2015   . ALPRAZolam (XANAX) 1 MG tablet Take 1 tablet (1 mg total) by mouth 3 (three) times daily as needed for anxiety. 12/10/2015: States not always taking TID; sometimes few times daily  . budesonide (PULMICORT) 0.25 MG/2ML nebulizer solution Take 2 mLs (0.25 mg total) by nebulization every 6 (six) hours. (Patient not taking: Reported on 12/23/2015) 12/23/2015: Not picked up from [facility] yet as of 12/23/15.  Marland Kitchen. doxazosin (CARDURA) 4 MG tablet Take 4 mg by mouth daily. Reported on 12/10/2015 12/10/2015: Not taking; per patient report, this medication was discontinued at the time of his admission to the hospital  . escitalopram (LEXAPRO) 20  MG tablet Take 20 mg by mouth at bedtime.    . gabapentin (NEURONTIN) 600 MG tablet Take 600 mg by mouth 3 (three) times daily. 12/10/2015: Able to verbalize change in dose  . ipratropium-albuterol (DUONEB) 0.5-2.5 (3) MG/3ML SOLN Take 3 mLs by nebulization 4 (four) times daily as needed (for shortness of breath). 12/10/2015: Having difficulty affording; [facility] who reports she has started manufacturer assistance application  . mometasone-formoterol (DULERA) 200-5 MCG/ACT AERO Inhale 2 puffs into the lungs 2 (two) times daily. 12/10/2015: Having difficulty affording; [facility] who has started patient assistance application process  . predniSONE (DELTASONE) 10 MG tablet Take 5 tablets (50 mg total) by mouth daily with breakfast. (Patient not taking: Reported on 12/10/2015) 12/10/2015: completed  . theophylline (UNIPHYL) 400 MG 24 hr tablet Take 400 mg by mouth 2 (two) times daily.    Assessment: Dustin [name] is a 60 year old male patient living with his [place] in [place] who recently was hospitalized for acute on chronic respiratory failure requiring mechanical ventilation secondary to COPD exacerbation. Dustin [name] is being [facility] and physical therapy.   Chronic Health Condition (COPD) Self Health management concerns - Dustin [name] says he feels his overall respiratory/pulmonary status is improved; he is being careful about prolonged time outside given the high pollen counts; he denies shortness of breath today and says his energy level is improving   Medication Management Concerns   Dulera/Spiriva - Dr Santo HeldKahn's office team had already been working on helping Dustin [name]  with patient assistance applications for [facility] and Spiriva. He says he had to complete a second set of paperwork for the [facility] patient assistance program and has not sent it to Dr. Santo Held office. Dustin [name] says he  has enough Dulera to last until the end of the month. He heard from the Spiriva patient assistance program last week who told him he would receive a package in the mail but he has yet to receive anything. Dustin [name] says he has Spiriva on hand but couldn't specify how much, just "what I had leftover from before." I referred Dustin [name] to our pharmacy team provide support and collaborate. [name], Pharmacist with [facility] is following Dustin [name] closely.   Plan:   I will ask our Pharmacist [name] to follow up with Dustin [name] re: his inhalers.   I will return a call to Dustin [name] next week to follow up on his progress.    Marja Kays MHA,BSN,RN,CCM [facility] Care Management  [phone]

## 2016-01-05 ENCOUNTER — Other Ambulatory Visit: Payer: Self-pay | Admitting: Pharmacist

## 2016-01-05 DIAGNOSIS — R0789 Other chest pain: Secondary | ICD-10-CM | POA: Diagnosis not present

## 2016-01-05 DIAGNOSIS — R000 Tachycardia, unspecified: Secondary | ICD-10-CM | POA: Diagnosis not present

## 2016-01-05 DIAGNOSIS — J441 Chronic obstructive pulmonary disease with (acute) exacerbation: Secondary | ICD-10-CM | POA: Diagnosis not present

## 2016-01-05 DIAGNOSIS — I5032 Chronic diastolic (congestive) heart failure: Secondary | ICD-10-CM | POA: Diagnosis not present

## 2016-01-05 DIAGNOSIS — I2510 Atherosclerotic heart disease of native coronary artery without angina pectoris: Secondary | ICD-10-CM | POA: Diagnosis not present

## 2016-01-05 DIAGNOSIS — Z720 Tobacco use: Secondary | ICD-10-CM | POA: Diagnosis not present

## 2016-01-05 DIAGNOSIS — Z9981 Dependence on supplemental oxygen: Secondary | ICD-10-CM | POA: Diagnosis not present

## 2016-01-05 DIAGNOSIS — I959 Hypotension, unspecified: Secondary | ICD-10-CM | POA: Diagnosis not present

## 2016-01-05 NOTE — Patient Outreach (Signed)
[name](THN) Care Management  [facility]   01/05/2016  Dustin CoriaDennis R [place] June 02, 1956 [id number]  Subjective:  [facility] Pharmacist received a message from Dustin [name], [facility] RN Community that patient had concerns regarding patient assistance for [employer] ([employer]) and Spiriva [employer]), Dr Milta DeitersKhan's office, PCP, is working on the paperwork for both patient assistance programs.   Phone call to patient this morning who verified name and date of birth.  He reports that he has the appeal form for [employer] that he needs to fill out yet and [employer] had called him last week to inform a package was shipped, but he reports he hasn't received it yet.  Patient gave verbal permission over the phone for [facility] Pharmacist to call [employer] to follow-up status of his application.    Objective:   Current Medications: Current Outpatient Prescriptions  Medication Sig Dispense Refill  . albuterol (PROVENTIL) (2.5 MG/3ML) 0.083% nebulizer solution Take 3 mLs (2.5 mg total) by nebulization every 3 (three) hours as needed for shortness of breath. (Patient not taking: Reported on 12/23/2015) 75 mL 12  . Albuterol Sulfate (PROAIR RESPICLICK) 108 (90 BASE) MCG/ACT AEPB Inhale 2 puffs into the lungs every 4 (four) hours as needed (for shortness of breath). Reported on 12/10/2015    . ALPRAZolam (XANAX) 1 MG tablet Take 1 tablet (1 mg total) by mouth 3 (three) times daily as needed for anxiety. 15 tablet 0  . budesonide (PULMICORT) 0.25 MG/2ML nebulizer solution Take 2 mLs (0.25 mg total) by nebulization every 6 (six) hours. (Patient not taking: Reported on 12/23/2015) 60 mL 12  . doxazosin (CARDURA) 4 MG tablet Take 4 mg by mouth daily. Reported on 12/10/2015    . escitalopram (LEXAPRO) 20 MG tablet Take 20 mg by mouth at bedtime.     . gabapentin (NEURONTIN) 600 MG tablet Take 600 mg by mouth 3 (three) times daily.    Marland Kitchen. ipratropium-albuterol (DUONEB)  0.5-2.5 (3) MG/3ML SOLN Take 3 mLs by nebulization 4 (four) times daily as needed (for shortness of breath).    . mometasone-formoterol (DULERA) 200-5 MCG/ACT AERO Inhale 2 puffs into the lungs 2 (two) times daily.    . predniSONE (DELTASONE) 10 MG tablet Take 5 tablets (50 mg total) by mouth daily with breakfast. (Patient not taking: Reported on 12/10/2015) 15 tablet 0  . theophylline (UNIPHYL) 400 MG 24 hr tablet Take 400 mg by mouth 2 (two) times daily.     No current facility-administered medications for this visit.    Functional Status: In your present state of health, do you have any difficulty performing the following activities: 12/13/2015 12/13/2015  Hearing? N N  Vision? Y N  Difficulty concentrating or making decisions? Y N  Walking or climbing stairs? Y Y  Dressing or bathing? N N  Doing errands, shopping? Malvin JohnsY Y  Preparing Food and eating ? N N  Using the Toilet? N N  In the past six months, have you accidently leaked urine? N N  Do you have problems with loss of bowel control? N N  Managing your Medications? Y Y  Managing your Finances? Malvin JohnsY Y  Housekeeping or managing your Housekeeping? Malvin JohnsY Y    Fall/Depression Screening: PHQ 2/9 Scores 12/13/2015  PHQ - 2 Score 2  PHQ- 9 Score 8    Assessment:  Called [employer] and was told patient is approved through 09/17/16 for Spiriva Respimat 2.5 mcg inhalers and a 90 day supply was shipped  12/30/15 to patient's home address--it appears to have been delivered 01/04/16 at about 1530 [employer].    Patient will need to complete appeals form for [employer] in order for that application to be evaluated for patient eligibility.   Plan:  1) Patient was informed via phone of delivery status of [employer] patient assistance supply of Spiriva.  It appears to have been delivered to patient's home address and patient reports presently staying with a family member---he will  see if delivery is at home.    2) Patient was strongly encouraged to complete appeals form for [employer] as soon as possible as that is required in order for patient's application with [employer] to be further evaluated for eligibility.   3) Pharmacist will follow up with patient via phone next week and will update Dustin [name], [facility] RN [employer].   [name], PharmD, [facility] Clinical Pharmacist [name] [phone]

## 2016-01-06 DIAGNOSIS — I959 Hypotension, unspecified: Secondary | ICD-10-CM | POA: Diagnosis not present

## 2016-01-06 DIAGNOSIS — Z720 Tobacco use: Secondary | ICD-10-CM | POA: Diagnosis not present

## 2016-01-06 DIAGNOSIS — R0789 Other chest pain: Secondary | ICD-10-CM | POA: Diagnosis not present

## 2016-01-06 DIAGNOSIS — I2510 Atherosclerotic heart disease of native coronary artery without angina pectoris: Secondary | ICD-10-CM | POA: Diagnosis not present

## 2016-01-06 DIAGNOSIS — R000 Tachycardia, unspecified: Secondary | ICD-10-CM | POA: Diagnosis not present

## 2016-01-06 DIAGNOSIS — J441 Chronic obstructive pulmonary disease with (acute) exacerbation: Secondary | ICD-10-CM | POA: Diagnosis not present

## 2016-01-06 DIAGNOSIS — I5032 Chronic diastolic (congestive) heart failure: Secondary | ICD-10-CM | POA: Diagnosis not present

## 2016-01-06 DIAGNOSIS — Z9981 Dependence on supplemental oxygen: Secondary | ICD-10-CM | POA: Diagnosis not present

## 2016-01-10 DIAGNOSIS — R000 Tachycardia, unspecified: Secondary | ICD-10-CM | POA: Diagnosis not present

## 2016-01-10 DIAGNOSIS — Z9981 Dependence on supplemental oxygen: Secondary | ICD-10-CM | POA: Diagnosis not present

## 2016-01-10 DIAGNOSIS — I2510 Atherosclerotic heart disease of native coronary artery without angina pectoris: Secondary | ICD-10-CM | POA: Diagnosis not present

## 2016-01-10 DIAGNOSIS — J441 Chronic obstructive pulmonary disease with (acute) exacerbation: Secondary | ICD-10-CM | POA: Diagnosis not present

## 2016-01-10 DIAGNOSIS — I959 Hypotension, unspecified: Secondary | ICD-10-CM | POA: Diagnosis not present

## 2016-01-10 DIAGNOSIS — I5032 Chronic diastolic (congestive) heart failure: Secondary | ICD-10-CM | POA: Diagnosis not present

## 2016-01-10 DIAGNOSIS — R0789 Other chest pain: Secondary | ICD-10-CM | POA: Diagnosis not present

## 2016-01-10 DIAGNOSIS — Z720 Tobacco use: Secondary | ICD-10-CM | POA: Diagnosis not present

## 2016-01-11 DIAGNOSIS — J441 Chronic obstructive pulmonary disease with (acute) exacerbation: Secondary | ICD-10-CM | POA: Diagnosis not present

## 2016-01-11 DIAGNOSIS — R000 Tachycardia, unspecified: Secondary | ICD-10-CM | POA: Diagnosis not present

## 2016-01-11 DIAGNOSIS — Z720 Tobacco use: Secondary | ICD-10-CM | POA: Diagnosis not present

## 2016-01-11 DIAGNOSIS — Z9981 Dependence on supplemental oxygen: Secondary | ICD-10-CM | POA: Diagnosis not present

## 2016-01-11 DIAGNOSIS — I5032 Chronic diastolic (congestive) heart failure: Secondary | ICD-10-CM | POA: Diagnosis not present

## 2016-01-11 DIAGNOSIS — R0789 Other chest pain: Secondary | ICD-10-CM | POA: Diagnosis not present

## 2016-01-11 DIAGNOSIS — I959 Hypotension, unspecified: Secondary | ICD-10-CM | POA: Diagnosis not present

## 2016-01-11 DIAGNOSIS — I2510 Atherosclerotic heart disease of native coronary artery without angina pectoris: Secondary | ICD-10-CM | POA: Diagnosis not present

## 2016-01-12 ENCOUNTER — Other Ambulatory Visit: Payer: Self-pay | Admitting: Licensed Clinical Social Worker

## 2016-01-12 ENCOUNTER — Other Ambulatory Visit: Payer: Self-pay | Admitting: Pharmacist

## 2016-01-12 DIAGNOSIS — Z9981 Dependence on supplemental oxygen: Secondary | ICD-10-CM | POA: Diagnosis not present

## 2016-01-12 DIAGNOSIS — R0789 Other chest pain: Secondary | ICD-10-CM | POA: Diagnosis not present

## 2016-01-12 DIAGNOSIS — I5032 Chronic diastolic (congestive) heart failure: Secondary | ICD-10-CM | POA: Diagnosis not present

## 2016-01-12 DIAGNOSIS — J441 Chronic obstructive pulmonary disease with (acute) exacerbation: Secondary | ICD-10-CM | POA: Diagnosis not present

## 2016-01-12 DIAGNOSIS — I2510 Atherosclerotic heart disease of native coronary artery without angina pectoris: Secondary | ICD-10-CM | POA: Diagnosis not present

## 2016-01-12 DIAGNOSIS — Z720 Tobacco use: Secondary | ICD-10-CM | POA: Diagnosis not present

## 2016-01-12 DIAGNOSIS — R000 Tachycardia, unspecified: Secondary | ICD-10-CM | POA: Diagnosis not present

## 2016-01-12 DIAGNOSIS — I959 Hypotension, unspecified: Secondary | ICD-10-CM | POA: Diagnosis not present

## 2016-01-12 NOTE — Patient Outreach (Signed)
[name](THN) Care Management  01/12/2016  Hershal CoriaDennis R [place]iegas [birth year] [id number]  Pharmacist attempted to reach patient to follow-up patient assistance for inhalers.  No answer, left a HIPAA compliant message.  Will continue to attempt to follow-up with patient.  Tommye StandardKevin Lennox Dolberry, PharmD, [facility] Clinical Pharmacist [name] [phone]

## 2016-01-12 NOTE — Patient Outreach (Signed)
Assessment:  CSW spoke via phone with client on 01/12/16. CSW verified client identity.Client and CSW spoke of client needs.  Client said he continues to [facility] .Home Health physical therapist is meeting again with client at home of client today. Client said he thinks physical therapy support [name] may end this week. He said he continues to receive home visits with [name] RN as needed for home health nursing support. He is eating well and sleeping adequately.  Client has been receiving [facility].  Client said he did receive recent delivery of prescribed Spiriva.  Client said that family members transport him to and from scheduled medical appointments.  He has appointment with a pulmonologist on 01/18/16 in [place].  He uses oxygen 24/7 as prescribed. He said oxygen is helping him to breathe better.  He gets fatigued easily.  He is residing at present at home of his son.  He said he continues to have financial challenges.  He also has marital struggles. He said he communicates periodically with his wife but that they have conflict regarding financial bills due.  Spouse of client has been working reduced hours recently. Daughter of client also lives with her two children and with spouse of client. Daughter of client does not work.  Client said he is trying to care for himself and follow medical recommendations. He said he is trying to limit his exposure to stressful situations with his family.  CSW and client spoke of client care plan. CSW encouraged client to continue to communicate with CSW in next 30 days to discuss community resources of assistance to client. CSW gave client [facility] CSW phone number of [phone] and encouraged client to call CSW as needed to discuss social work needs of client. CSW thanked DurangoDennis for phone conversation with CSW on 01/12/16.   Plan:  Client to communicate, as needed, with CSW in next 30 days to discuss  community resources of assistance to client. CSW to collaborate with RN Marja KaysAlisa Gilboy in monitoring client needs. CSW to call client in 3 weeks to assess needs of client at that time.  Kelton PillarMichael S.Jasslyn Finkel MSW, [name] Licensed Clinical Social Worker [facility] Care Management [phone]

## 2016-01-13 ENCOUNTER — Other Ambulatory Visit: Payer: Self-pay | Admitting: Pharmacist

## 2016-01-13 DIAGNOSIS — I5032 Chronic diastolic (congestive) heart failure: Secondary | ICD-10-CM | POA: Diagnosis not present

## 2016-01-13 DIAGNOSIS — I959 Hypotension, unspecified: Secondary | ICD-10-CM | POA: Diagnosis not present

## 2016-01-13 DIAGNOSIS — R000 Tachycardia, unspecified: Secondary | ICD-10-CM | POA: Diagnosis not present

## 2016-01-13 DIAGNOSIS — J441 Chronic obstructive pulmonary disease with (acute) exacerbation: Secondary | ICD-10-CM | POA: Diagnosis not present

## 2016-01-13 DIAGNOSIS — R0789 Other chest pain: Secondary | ICD-10-CM | POA: Diagnosis not present

## 2016-01-13 DIAGNOSIS — I2510 Atherosclerotic heart disease of native coronary artery without angina pectoris: Secondary | ICD-10-CM | POA: Diagnosis not present

## 2016-01-13 DIAGNOSIS — Z720 Tobacco use: Secondary | ICD-10-CM | POA: Diagnosis not present

## 2016-01-13 DIAGNOSIS — Z9981 Dependence on supplemental oxygen: Secondary | ICD-10-CM | POA: Diagnosis not present

## 2016-01-14 ENCOUNTER — Other Ambulatory Visit: Payer: Self-pay | Admitting: Pharmacist

## 2016-01-14 NOTE — Patient Outreach (Signed)
[name](THN) Care Management  01/14/2016  Dustin CoriaDennis R [name] 02-03-1956 [id number]  Pharmacist called patient today to follow-up with patient to see if he called his PCP as discussed yesterday regarding weight gain and low blood pressure.   Patient verified name and date of birth at start of call.  Patient reports that he did call Dr Milta DeitersKhan's office and he reports that Dr Welton FlakesKhan has him holding doxazosin and taking furosemide at this time.    Patient was reminded that he can utilize the [facility] 24 hour nurse line if needed.    Pharmacist also placed call to Serina CowperAlisa, [facility] RN [facility] Care Coord[facility] to update her on patient's status.     Tommye StandardKevin Knowledge Escandon, PharmD, [facility] Clinical Pharmacist [name] [phone]

## 2016-01-14 NOTE — Patient Outreach (Addendum)
[name](THN) Care Management  [facility]   01/14/2016  Dustin CoriaDennis R [name] Oct 21, 1955 [id number]  Subjective:  Pharmacist contacted patient via phone on 01/13/16 to follow-up manufacturer patient assistance for Spiriva and Dulera.  Patient verified name and date of birth.   Patient reports that he was able to obtain his Spiriva shipment last week and verified he obtained a 90 day supply.  He reports it came with directions for how to request refill when it is needed.    Patient reports that he just recently got appeals form completed for [employer].    He reports that he has gained about 3 pounds over the past couple of days, he reports home health contacted his PCP regarding low blood pressure reading---he reports that he had not called his provider about this, and has not heard from his PCP yet.   He denies any shortness of breath.     Objective:   Current Medications: Current Outpatient Prescriptions  Medication Sig Dispense Refill  . albuterol (PROVENTIL) (2.5 MG/3ML) 0.083% nebulizer solution Take 3 mLs (2.5 mg total) by nebulization every 3 (three) hours as needed for shortness of breath. (Patient not taking: Reported on 12/23/2015) 75 mL 12  . Albuterol Sulfate (PROAIR RESPICLICK) 108 (90 BASE) MCG/ACT AEPB Inhale 2 puffs into the lungs every 4 (four) hours as needed (for shortness of breath). Reported on 12/10/2015    . ALPRAZolam (XANAX) 1 MG tablet Take 1 tablet (1 mg total) by mouth 3 (three) times daily as needed for anxiety. 15 tablet 0  . budesonide (PULMICORT) 0.25 MG/2ML nebulizer solution Take 2 mLs (0.25 mg total) by nebulization every 6 (six) hours. (Patient not taking: Reported on 12/23/2015) 60 mL 12  . doxazosin (CARDURA) 4 MG tablet Take 4 mg by mouth daily. Reported on 12/10/2015    . escitalopram (LEXAPRO) 20 MG tablet Take 20 mg by mouth at bedtime.     . gabapentin (NEURONTIN) 600 MG tablet Take 600 mg by mouth 3 (three) times daily.    Marland Kitchen.  ipratropium-albuterol (DUONEB) 0.5-2.5 (3) MG/3ML SOLN Take 3 mLs by nebulization 4 (four) times daily as needed (for shortness of breath).    . mometasone-formoterol (DULERA) 200-5 MCG/ACT AERO Inhale 2 puffs into the lungs 2 (two) times daily.    . predniSONE (DELTASONE) 10 MG tablet Take 5 tablets (50 mg total) by mouth daily with breakfast. (Patient not taking: Reported on 12/10/2015) 15 tablet 0  . theophylline (UNIPHYL) 400 MG 24 hr tablet Take 400 mg by mouth 2 (two) times daily.     No current facility-administered medications for this visit.    Functional Status: In your present state of health, do you have any difficulty performing the following activities: 12/13/2015 12/13/2015  Hearing? N N  Vision? Y N  Difficulty concentrating or making decisions? Y N  Walking or climbing stairs? Y Y  Dressing or bathing? N N  Doing errands, shopping? Malvin JohnsY Y  Preparing Food and eating ? N N  Using the Toilet? N N  In the past six months, have you accidently leaked urine? N N  Do you have problems with loss of bowel control? N N  Managing your Medications? Y Y  Managing your Finances? Malvin JohnsY Y  Housekeeping or managing your Housekeeping? Malvin JohnsY Y    Fall/Depression Screening: PHQ 2/9 Scores 12/13/2015  PHQ - 2 Score 2  PHQ- 9 Score 8    Assessment:  Medication assistance:  Will continue to follow-up until a  decision is heard from [employer] on patient's eligibility for [facility] assistance.     Plan:  1) Encouraged patient to contact his PCP regarding his weight gain and reported low blood pressure.    2) Will follow-up with patient regarding manufacturer patient assistance with [employer] and will follow-up with patient on 01/14/16 regarding his phone call to his provider.    [name], PharmD, [facility] Clinical Pharmacist [name] [phone]

## 2016-01-18 ENCOUNTER — Inpatient Hospital Stay: Payer: Self-pay | Admitting: Pulmonary Disease

## 2016-01-21 DIAGNOSIS — Z9981 Dependence on supplemental oxygen: Secondary | ICD-10-CM | POA: Diagnosis not present

## 2016-01-21 DIAGNOSIS — I2510 Atherosclerotic heart disease of native coronary artery without angina pectoris: Secondary | ICD-10-CM | POA: Diagnosis not present

## 2016-01-21 DIAGNOSIS — Z720 Tobacco use: Secondary | ICD-10-CM | POA: Diagnosis not present

## 2016-01-21 DIAGNOSIS — I5032 Chronic diastolic (congestive) heart failure: Secondary | ICD-10-CM | POA: Diagnosis not present

## 2016-01-21 DIAGNOSIS — R000 Tachycardia, unspecified: Secondary | ICD-10-CM | POA: Diagnosis not present

## 2016-01-21 DIAGNOSIS — I959 Hypotension, unspecified: Secondary | ICD-10-CM | POA: Diagnosis not present

## 2016-01-21 DIAGNOSIS — J441 Chronic obstructive pulmonary disease with (acute) exacerbation: Secondary | ICD-10-CM | POA: Diagnosis not present

## 2016-01-21 DIAGNOSIS — R0789 Other chest pain: Secondary | ICD-10-CM | POA: Diagnosis not present

## 2016-01-24 ENCOUNTER — Other Ambulatory Visit: Payer: Self-pay | Admitting: Pharmacist

## 2016-01-24 NOTE — Patient Outreach (Signed)
[name](THN) Care Management  01/24/2016  Dustin [name] 1956-08-03 [id number]  [facility] Pharmacist called patient to follow-up on [employer] patient assistance application.  Patient verified, name, date of birth and address.  Patient reports that he had taken the application back to his MD office but hasn't heard anything from manufacturer patient assistance yet.   Patient gave verbal permission for pharmacist to contact [employer] patient assistance to follow-up on application status.   Contacted [employer] patient assistance and was told that application was received 01/21/16 and it being processed to mail back to patient's home address with an appeals form that patient will need to complete and mail back along with the original application to have eligibility determined.    Placed another call back to patient to update him on the application status per [employer].  Patient verbalized understanding and appreciated follow-up.   Plan:  Will call patient in about 2 weeks to determine if he received, and mail back appeals form for patient assistance.   Tommye StandardKevin Aubrionna Istre, PharmD, [facility] Clinical Pharmacist [name] [phone]

## 2016-01-26 DIAGNOSIS — I2510 Atherosclerotic heart disease of native coronary artery without angina pectoris: Secondary | ICD-10-CM | POA: Diagnosis not present

## 2016-01-26 DIAGNOSIS — R000 Tachycardia, unspecified: Secondary | ICD-10-CM | POA: Diagnosis not present

## 2016-01-26 DIAGNOSIS — J441 Chronic obstructive pulmonary disease with (acute) exacerbation: Secondary | ICD-10-CM | POA: Diagnosis not present

## 2016-01-26 DIAGNOSIS — J471 Bronchiectasis with (acute) exacerbation: Secondary | ICD-10-CM | POA: Diagnosis not present

## 2016-01-26 DIAGNOSIS — Z9981 Dependence on supplemental oxygen: Secondary | ICD-10-CM | POA: Diagnosis not present

## 2016-01-26 DIAGNOSIS — R0789 Other chest pain: Secondary | ICD-10-CM | POA: Diagnosis not present

## 2016-01-26 DIAGNOSIS — Z720 Tobacco use: Secondary | ICD-10-CM | POA: Diagnosis not present

## 2016-01-26 DIAGNOSIS — I5032 Chronic diastolic (congestive) heart failure: Secondary | ICD-10-CM | POA: Diagnosis not present

## 2016-01-26 DIAGNOSIS — I959 Hypotension, unspecified: Secondary | ICD-10-CM | POA: Diagnosis not present

## 2016-02-01 DIAGNOSIS — I5032 Chronic diastolic (congestive) heart failure: Secondary | ICD-10-CM | POA: Diagnosis not present

## 2016-02-01 DIAGNOSIS — R000 Tachycardia, unspecified: Secondary | ICD-10-CM | POA: Diagnosis not present

## 2016-02-01 DIAGNOSIS — R0789 Other chest pain: Secondary | ICD-10-CM | POA: Diagnosis not present

## 2016-02-01 DIAGNOSIS — Z9981 Dependence on supplemental oxygen: Secondary | ICD-10-CM | POA: Diagnosis not present

## 2016-02-01 DIAGNOSIS — I2510 Atherosclerotic heart disease of native coronary artery without angina pectoris: Secondary | ICD-10-CM | POA: Diagnosis not present

## 2016-02-01 DIAGNOSIS — I959 Hypotension, unspecified: Secondary | ICD-10-CM | POA: Diagnosis not present

## 2016-02-01 DIAGNOSIS — J441 Chronic obstructive pulmonary disease with (acute) exacerbation: Secondary | ICD-10-CM | POA: Diagnosis not present

## 2016-02-01 DIAGNOSIS — Z720 Tobacco use: Secondary | ICD-10-CM | POA: Diagnosis not present

## 2016-02-02 ENCOUNTER — Other Ambulatory Visit: Payer: Self-pay | Admitting: Licensed Clinical Social Worker

## 2016-02-02 ENCOUNTER — Other Ambulatory Visit: Payer: Self-pay | Admitting: *Deleted

## 2016-02-02 NOTE — Patient Outreach (Signed)
[name](THN) Care Management  02/02/2016  Hershal CoriaDennis R Lasater 11-05-1955 [id number]   Mr. Hershal CoriaDennis R Raabe is a 60 year old gentleman who was admitted to the hospital from 12/06/15 to 12/10/15 with acute on chronic hypoxic respiratory failure secondary to COPD flare requiring mechanical ventilation. [name] has history of CHF w/ acute episode during his hospital admission, and pulmonary hypertension.   As a COPD Gold IV patient, [name] was [facility] from the hospital for transition of care services and COPD disease management and care coordination. He is being actively followed in the community for nursing case management, clinical social work, and pharmacy services.   Medication Management - [name] has been working with his primary care provider and our [facility] pharmacy team and now has all prescribed medication. He has been assisted by our team to help with medication assistance applications.   Community Resources - [name] has been working actively with our [name] with regard to financial needs and community resources. [name] expresses appreciation for this extra help.   Chronic Disease management - Mr. Truss's respiratory/pulmonary status have improved dramatically and he says he attributes this to having a better understanding of his medications and how important it is to continue them, ask for help getting them when he needs them, and taking them daily as prescribed. [name] has been followed at home by home health in addition to the [facility] Care [facility] team and says the [facility] is still seeing him but only once weekly. [name] says he feels he is doing well and would like for me to call him again next month but doesn't think he needs a visit.   Plan: I will reach out to Mr. Syfert again by phone next week and we will make a decision at that time between d/c from [facility] CM services vs transferring to telephonic case  management/health coaching.    Marja Kayslisa Gilboy MHA,BSN,RN,CCM [facility] Care Management  [phone]

## 2016-02-02 NOTE — Patient Outreach (Signed)
Assessment:  CSW spoke via phone with client on 02/02/16. CSW verified client identity. CSW and client spoke of client needs. Client resides at home of his son. Client has been using oxygen as prescribed in the home to help client with breathing. Client said he uses oxygen 24/7 in the home to help him with breathing. Client has his prescribed medications and is taking medications as prescribed. Client had been receiving in [facility] physical [facility]. Client said home health physical therapy is now completed. Also, [facility] is still visiting client in the home for nursing support. Client has financial challenges. He has marital challenges as well.  He said that he and his spouse sometimes argue about finances. Client is trying to attend all scheduled client medical appointments. Client said that family members help in transporting him to and from his scheduled medical appointments.  Client is communicating with [facility] pharmac[facility] Caryn BeeKevin Reudinger about medication needs of client. CSW spoke with client about client care plan. CSW encouraged client to communicate with CSW in next 30 days to discuss with CSW community resurces of assistance to client.  CSW thanked client for phone call with CSW on 02/02/16.   Plan:  Client to communicate with CSW in next 30 days as needed to discuss with CSW community resources of assistance to client. CSW to call client in 4 weeks to assess needs of client at that time.   Kelton PillarMichael S.Sascha Palma MSW, [name] Licensed Clinical Social Worker [facility] Care Management [phone]

## 2016-02-07 ENCOUNTER — Other Ambulatory Visit: Payer: Self-pay | Admitting: Pharmacist

## 2016-02-07 DIAGNOSIS — J441 Chronic obstructive pulmonary disease with (acute) exacerbation: Secondary | ICD-10-CM | POA: Diagnosis not present

## 2016-02-07 DIAGNOSIS — J9611 Chronic respiratory failure with hypoxia: Secondary | ICD-10-CM | POA: Diagnosis not present

## 2016-02-07 DIAGNOSIS — J432 Centrilobular emphysema: Secondary | ICD-10-CM | POA: Diagnosis not present

## 2016-02-07 DIAGNOSIS — J0101 Acute recurrent maxillary sinusitis: Secondary | ICD-10-CM | POA: Diagnosis not present

## 2016-02-07 NOTE — Patient Outreach (Signed)
[name](THN) Care Management  02/07/2016  Dustin [name] [birth year] [id number]  [facility] made an attempt to reach patient via phone to follow-up his patient assistance for [facility].  Left HIPAA compliant message requesting call back.   Plan:  Will make a second attempt to reach patient with the next week.    Tommye StandardKevin Evy Lutterman, PharmD, [facility] CenterBCACP Clinical Pharmacist [name] [phone]

## 2016-02-08 ENCOUNTER — Other Ambulatory Visit: Payer: Self-pay | Admitting: Pharmacist

## 2016-02-08 NOTE — Patient Outreach (Signed)
[name]) Care Management  [facility]   02/08/2016  Dustin [name] 01/04/1956 [id number]  Subjective:  Dustin [name] Pharmacist successfully reached patient via phone to follow-up [employer] patient assistance.  Patient verified his name and date of birth.  Patient reports that he received the exception form from [employer] and mailed it back about 1-2 weeks ago.  He reports he has not heard anything else about his application yet.    Patient gave permission for pharmacist to contact [employer] patient assistance to follow-up his application status.    Objective:   Current Medications: Current Outpatient Prescriptions  Medication Sig Dispense Refill  . albuterol (PROVENTIL) (2.5 MG/3ML) 0.083% nebulizer solution Take 3 mLs (2.5 mg total) by nebulization every 3 (three) hours as needed for shortness of breath. (Patient not taking: Reported on 12/23/2015) 75 mL 12  . Albuterol Sulfate (PROAIR RESPICLICK) 108 (90 BASE) MCG/ACT AEPB Inhale 2 puffs into the lungs every 4 (four) hours as needed (for shortness of breath). Reported on 12/10/2015    . ALPRAZolam (XANAX) 1 MG tablet Take 1 tablet (1 mg total) by mouth 3 (three) times daily as needed for anxiety. 15 tablet 0  . budesonide (PULMICORT) 0.25 MG/2ML nebulizer solution Take 2 mLs (0.25 mg total) by nebulization every 6 (six) hours. (Patient not taking: Reported on 12/23/2015) 60 mL 12  . doxazosin (CARDURA) 4 MG tablet Take 4 mg by mouth daily. Reported on 12/10/2015    . escitalopram (LEXAPRO) 20 MG tablet Take 20 mg by mouth at bedtime.     . gabapentin (NEURONTIN) 600 MG tablet Take 600 mg by mouth 3 (three) times daily.    Marland Kitchen ipratropium-albuterol (DUONEB) 0.5-2.5 (3) MG/3ML SOLN Take 3 mLs by nebulization 4 (four) times daily as needed (for shortness of breath).    . mometasone-formoterol (DULERA) 200-5 MCG/ACT AERO Inhale 2 puffs into the lungs 2 (two) times daily.    . predniSONE (DELTASONE) 10 MG tablet Take 5 tablets (50 mg total)  by mouth daily with breakfast. (Patient not taking: Reported on 12/10/2015) 15 tablet 0  . theophylline (UNIPHYL) 400 MG 24 hr tablet Take 400 mg by mouth 2 (two) times daily.     No current facility-administered medications for this visit.    Functional Status: In your present state of health, do you have any difficulty performing the following activities: 12/13/2015 12/13/2015  Hearing? N N  Vision? Y N  Difficulty concentrating or making decisions? Y N  Walking or climbing stairs? Y Y  Dressing or bathing? N N  Doing errands, shopping? Malvin Johns  Preparing Food and eating ? N N  Using the Toilet? N N  In the past six months, have you accidently leaked urine? N N  Do you have problems with loss of bowel control? N N  Managing your Medications? Y Y  Managing your Finances? Malvin Johns  Housekeeping or managing your Housekeeping? Malvin Johns    Fall/Depression Screening: PHQ 2/9 Scores 02/02/2016 12/13/2015  PHQ - 2 Score 2 2  PHQ- 9 Score 8 8    Assessment:  1) Medication patient assistance:  Placed call to [employer] patient assistance program.  Representative reports that patient's application was approved on 02/04/16.  Representative reports that it an take up to 48 hours for the patient's information to be sent from [employer] patient assistance to the dispensing pharmacy.  Representative does report that prescriber will likely be contacted due to missing quantity to dispense on the application.  Plan:  1) Patient was updated on status of his Dulera patient assistance with [employer].    2) Will send note to Dr Welton FlakesKhan to make PCP office aware [employer] patient assistance may be contacting them regarding quantity to dispense.    3) Will follow-up with patient next week to see if he received medication yet.   Tommye StandardKevin Haywood Meinders, PharmD, [facility] CenterBCACP Clinical Pharmacist [name] [phone]

## 2016-02-11 DIAGNOSIS — R0789 Other chest pain: Secondary | ICD-10-CM | POA: Diagnosis not present

## 2016-02-11 DIAGNOSIS — Z9981 Dependence on supplemental oxygen: Secondary | ICD-10-CM | POA: Diagnosis not present

## 2016-02-11 DIAGNOSIS — Z720 Tobacco use: Secondary | ICD-10-CM | POA: Diagnosis not present

## 2016-02-11 DIAGNOSIS — I5032 Chronic diastolic (congestive) heart failure: Secondary | ICD-10-CM | POA: Diagnosis not present

## 2016-02-11 DIAGNOSIS — R000 Tachycardia, unspecified: Secondary | ICD-10-CM | POA: Diagnosis not present

## 2016-02-11 DIAGNOSIS — I959 Hypotension, unspecified: Secondary | ICD-10-CM | POA: Diagnosis not present

## 2016-02-11 DIAGNOSIS — I2510 Atherosclerotic heart disease of native coronary artery without angina pectoris: Secondary | ICD-10-CM | POA: Diagnosis not present

## 2016-02-11 DIAGNOSIS — J441 Chronic obstructive pulmonary disease with (acute) exacerbation: Secondary | ICD-10-CM | POA: Diagnosis not present

## 2016-02-16 ENCOUNTER — Other Ambulatory Visit: Payer: Self-pay | Admitting: Pharmacist

## 2016-02-16 DIAGNOSIS — J449 Chronic obstructive pulmonary disease, unspecified: Secondary | ICD-10-CM | POA: Diagnosis not present

## 2016-02-16 DIAGNOSIS — J9610 Chronic respiratory failure, unspecified whether with hypoxia or hypercapnia: Secondary | ICD-10-CM | POA: Diagnosis not present

## 2016-02-16 DIAGNOSIS — I959 Hypotension, unspecified: Secondary | ICD-10-CM | POA: Diagnosis not present

## 2016-02-16 DIAGNOSIS — I2510 Atherosclerotic heart disease of native coronary artery without angina pectoris: Secondary | ICD-10-CM | POA: Diagnosis not present

## 2016-02-16 DIAGNOSIS — I5032 Chronic diastolic (congestive) heart failure: Secondary | ICD-10-CM | POA: Diagnosis not present

## 2016-02-16 DIAGNOSIS — Z9981 Dependence on supplemental oxygen: Secondary | ICD-10-CM | POA: Diagnosis not present

## 2016-02-16 NOTE — Patient Outreach (Signed)
[name](THN) Care Management  02/16/2016  Hershal CoriaDennis R [place] July 22, 1956 [id number]  [facility] called patient back to follow-up status of Dulera patient assistance.  Patient answered phone and verified name and date of birth.    Patient reports that he has not received anything from [employer] patient assistance or heard any update from the patient assistance company.   Patient reports that he does have Dulera at this time as a sample from his provider's office.    Pharmacist offered to call [employer] patient assistance to follow-up and patient gave verbal permission for this.   Placed call to [employer] patient assistance and was told that Elwin SleightDulera was shipped 02/09/16 to patient's home address.    Attempted to call patient back to provide this update and patient did not answer, left a HIPAA compliant message requesting a return call.    Plan:  Will place a follow-up call to patient over the next week if no return call received from patient.    Tommye StandardKevin Anjolie Majer, PharmD, [facility] HospitalBCACP Clinical Pharmacist [name] [phone]

## 2016-02-17 ENCOUNTER — Other Ambulatory Visit: Payer: Self-pay | Admitting: Pharmacist

## 2016-02-17 NOTE — Patient Outreach (Signed)
[name](THN) Care Management  02/17/2016  Dustin CoriaDennis R [name] 03/19/1956 [id number]  Inbound call from patient returning [facility] from yesterday.  Patient verified name and date of birth.   Patient reports he has not yet received Dulera from [employer] patient assistance.  Advised patient that per phone call to [employer] patient assistance on 02/16/16 patient assistance program reports medication was shipped to patient on 02/09/16---encouraged patient to look out for it at his home address.   Plan:  Pharmacist will continue to follow-up to determine if he received patient assistance.    Dustin StandardKevin Kadence [name], PharmD, [facility] OverlookBCACP Clinical Pharmacist [name] [phone]

## 2016-02-21 ENCOUNTER — Other Ambulatory Visit: Payer: Self-pay | Admitting: Pharmacist

## 2016-02-21 ENCOUNTER — Encounter: Payer: Self-pay | Admitting: Pharmacist

## 2016-02-21 NOTE — Patient Outreach (Signed)
[name](THN) Care Management  02/21/2016  Hershal CoriaDennis R Dolecki 11-04-1955 [id number]  Call to patient to follow-up patient assistance with [employer] for [facility].  Patient answered call and verified name/date of birth.  Patient reports that he received [facility] patient [facility] over the weekend and it came with instructions for ordering refills per his report.    Was anticipating pharmacy case closure as patient received manufacturer assistance for Spiriva and Dulera, but today patient discussed concerns with albuterol inhaler.  He reports he is presently using ProAir.  Given patient was able to obtain manufacturer assistance with [facility], would suggest consideration be given to applying to [employer] again to try to obtain assistance with Proventil (albuterol inhaler).    Plan:  Will mail patient a new application for [employer] to apply for Proventil---patient reports address to mail application is [address] [place].    Will route this note to patient's PCP as well.    Tommye StandardKevin Hasini Peachey, PharmD, [facility] HospitalBCACP Clinical Pharmacist [name] [phone]

## 2016-02-23 DIAGNOSIS — Z9981 Dependence on supplemental oxygen: Secondary | ICD-10-CM | POA: Diagnosis not present

## 2016-02-23 DIAGNOSIS — J9610 Chronic respiratory failure, unspecified whether with hypoxia or hypercapnia: Secondary | ICD-10-CM | POA: Diagnosis not present

## 2016-02-23 DIAGNOSIS — I2510 Atherosclerotic heart disease of native coronary artery without angina pectoris: Secondary | ICD-10-CM | POA: Diagnosis not present

## 2016-02-23 DIAGNOSIS — I5032 Chronic diastolic (congestive) heart failure: Secondary | ICD-10-CM | POA: Diagnosis not present

## 2016-02-23 DIAGNOSIS — J449 Chronic obstructive pulmonary disease, unspecified: Secondary | ICD-10-CM | POA: Diagnosis not present

## 2016-02-23 DIAGNOSIS — I959 Hypotension, unspecified: Secondary | ICD-10-CM | POA: Diagnosis not present

## 2016-02-26 DIAGNOSIS — J471 Bronchiectasis with (acute) exacerbation: Secondary | ICD-10-CM | POA: Diagnosis not present

## 2016-02-28 ENCOUNTER — Other Ambulatory Visit: Payer: Self-pay | Admitting: *Deleted

## 2016-02-28 NOTE — Patient Outreach (Signed)
[name](THN) Care Management  02/28/2016  Hershal CoriaDennis R Bevis 1956/04/20 [id number]   I reached out to Mr. Bowler this afternoon to follow up on COPD management. He is still receiving home health nurse visits and expects a visit tomorrow. [name] is still taking medications as prescribed.   Today, he reports cough productive of yellow to green sputum worsening over the last 4-5 days. He denies fever. He reports discomfort "like burning and soreness" in his chest and "down in the belly". He says he has less energy over the last few days and is more short of breath. He denies chest pain or swelling of the extremities or abdomen.   Plan: I will provide a report of symptoms to Dr. Welton FlakesKhan today and will follow up with a call tomorrow.    Marja Kayslisa Gilboy MHA,BSN,RN,CCM [facility] Care Management  [phone]

## 2016-02-29 ENCOUNTER — Other Ambulatory Visit: Payer: Self-pay | Admitting: Physician Assistant

## 2016-02-29 ENCOUNTER — Other Ambulatory Visit: Payer: Self-pay | Admitting: *Deleted

## 2016-02-29 DIAGNOSIS — R05 Cough: Secondary | ICD-10-CM

## 2016-02-29 DIAGNOSIS — R0602 Shortness of breath: Secondary | ICD-10-CM

## 2016-02-29 DIAGNOSIS — R059 Cough, unspecified: Secondary | ICD-10-CM

## 2016-02-29 NOTE — Patient Outreach (Signed)
[name](THN) Care Management  02/29/2016  Hershal CoriaDennis R Tapley [birth year] [id number]   Call placed to the office of Dr. Beverely RisenFozia Khan [phone] regarding Mr. Mahl's respiratory symptoms as documented and sent via secure message to Dr. Welton FlakesKhan yesterday. I left a message marked "urgent" on the nurse voice mail line this morning at 10:50am, requesting that the information sent yesterday be passed along to Dr. Welton FlakesKhan or another provider in the office and a call be returned to me or Mr. Tomczak today.   Plan: I will return a call to Dr. Milta DeitersKhan's office by 4pm this afternoon if I have not heard from the office or if when I speak with [name] he has not heard from the office with recommendations regarding his symptoms.    Marja Kayslisa Rolando Whitby MHA,BSN,RN,CCM [facility] Care Management  [phone]

## 2016-03-01 ENCOUNTER — Ambulatory Visit
Admission: RE | Admit: 2016-03-01 | Discharge: 2016-03-01 | Disposition: A | Discharge status: Home / Self Care | Payer: Medicare Other | Source: Ambulatory Visit | Attending: Physician Assistant | Admitting: Physician Assistant

## 2016-03-01 DIAGNOSIS — R0602 Shortness of breath: Secondary | ICD-10-CM

## 2016-03-01 DIAGNOSIS — R05 Cough: Secondary | ICD-10-CM | POA: Diagnosis not present

## 2016-03-01 DIAGNOSIS — J449 Chronic obstructive pulmonary disease, unspecified: Secondary | ICD-10-CM | POA: Insufficient documentation

## 2016-03-01 DIAGNOSIS — R059 Cough, unspecified: Secondary | ICD-10-CM

## 2016-03-01 DIAGNOSIS — J324 Chronic pansinusitis: Secondary | ICD-10-CM | POA: Diagnosis not present

## 2016-03-01 DIAGNOSIS — J9611 Chronic respiratory failure with hypoxia: Secondary | ICD-10-CM | POA: Diagnosis not present

## 2016-03-01 DIAGNOSIS — J431 Panlobular emphysema: Secondary | ICD-10-CM | POA: Diagnosis not present

## 2016-03-02 ENCOUNTER — Other Ambulatory Visit: Payer: Self-pay | Admitting: Licensed Clinical Social Worker

## 2016-03-02 NOTE — Patient Outreach (Signed)
Assessment:  CSW spoke via phone with client on 03/02/16. CSW verified client identity. CSW and client spoke of client needs. Client continues to reside at the home of his son. Client has prescribed medications and is taking medications as prescribed. Client [facility] with [facility]. Client is using oxygen in the home 24/7 as prescribed. Client sees. Dr. Beverely RisenFozia Khan as primary care doctor.  Client said he had appointment with Dr. Welton FlakesKhan yesterday.  Client had a chest xray. He said doctor placed him on a round of antibiotics to help with chest congestion and nasal drainage and coughing.   Client has support from his son and daughter in law.  CSW and client spoke of client care plan. CSW encouraged client to communicate with CSW in next 30 days about community resources for client and ways these resources may help client financially. Client is also receiving [facility]  nursing support with RN Marja KaysAlisa Gilboy.  Client said he is eating adequately and sleeping adequately.  Client said he had talked with Dr. Welton FlakesKhan about client  periodic headache pain. Client has a nebulizer and uses nebulizer as prescribed. Client and CSW spoke at length about client financial needs and client's financial situation.  Client said he tries to pay monthly bills on time but is challenged with meeting his financial obligations monthly.  He said he enjoys staying at home of his son and daughter in law He said environment at home of son is relaxed and this helps client to recuperate.  CSW encouraged client to call CSW at [phone] as needed to discuss social work needs of client. Dustin [name] was appreciative of call from CSW; Dustin [name] said that support of [facility] programs was very beneficial to him.   Plan:  Client to communicate with CSW in next 30 days about community resources and ways these resources may help client financially. CSW to call client in 4 weeks to assess client needs.  Dustin PillarMichael S.Tung [name] MSW, [name] Licensed  Clinical Social Worker [facility] Care Management [phone]

## 2016-03-03 ENCOUNTER — Other Ambulatory Visit: Payer: Self-pay | Admitting: *Deleted

## 2016-03-03 NOTE — Patient Outreach (Signed)
[name](THN) Care Management  03/03/2016  Dustin CoriaDennis R [name] Apr 13, 1956 [id number]   I reached out to Dustin [name] today to follow up on his ongoing respiratory symptoms and his visit with his primary care doctor. Dustin [name] says he had a chest x ray as ordered by his doctor and was told it was "unchanged". He was prescribed Levaquin and another course of oral steroids (taper) which he has been taking as directed since Wednesday. Dustin [name] says he feels "wired up from the steroids" but his respiratory symptoms are slightly improved as he says he feels he can expectorate some of what he feels is in his chest. He is grateful to have seen his doctor and been able to avoid a trip to the emergency department or urgent care.   Plan: Dustin [name] will call his doctor if he has new or worsened symptoms or will call me if he needs any assistance. I will call Dustin [name] next week to follow up and we will schedule a face to face visit for the following week at that time.    Marja Kayslisa Gilboy MHA,BSN,RN,CCM [facility] Care Management  [phone]

## 2016-03-06 ENCOUNTER — Other Ambulatory Visit: Payer: Self-pay | Admitting: Pharmacist

## 2016-03-06 NOTE — Patient Outreach (Signed)
[name](THN) Care Management  03/06/2016  Hershal CoriaDennis R Mountjoy 06/17/1956 [id number]  Placed call to patient to follow-up if he had received [employer] patient assistance application in the mail to attempt to apply for albuterol inhaler.    Patient states that he has not checked his mail for "a while."  Advised patient that if he did not receive the application to call this [facility] Pharmacist back who can facilitate getting a new one mailed to him.  It should have been mailed about 2 weeks ago.    Patient verbalized understanding.  Patient denies other pharmacy needs at this time.   Plan:  Will call patient in about 2 weeks if [employer] application has not been received back to [facility].     Tommye StandardKevin Sabree Nuon, PharmD, [facility]BCACP Clinical Pharmacist [name] [phone]

## 2016-03-08 DIAGNOSIS — J9610 Chronic respiratory failure, unspecified whether with hypoxia or hypercapnia: Secondary | ICD-10-CM | POA: Diagnosis not present

## 2016-03-08 DIAGNOSIS — J449 Chronic obstructive pulmonary disease, unspecified: Secondary | ICD-10-CM | POA: Diagnosis not present

## 2016-03-08 DIAGNOSIS — I959 Hypotension, unspecified: Secondary | ICD-10-CM | POA: Diagnosis not present

## 2016-03-08 DIAGNOSIS — I2510 Atherosclerotic heart disease of native coronary artery without angina pectoris: Secondary | ICD-10-CM | POA: Diagnosis not present

## 2016-03-08 DIAGNOSIS — I5032 Chronic diastolic (congestive) heart failure: Secondary | ICD-10-CM | POA: Diagnosis not present

## 2016-03-08 DIAGNOSIS — Z9981 Dependence on supplemental oxygen: Secondary | ICD-10-CM | POA: Diagnosis not present

## 2016-03-09 ENCOUNTER — Other Ambulatory Visit: Payer: Self-pay | Admitting: *Deleted

## 2016-03-09 NOTE — Patient Outreach (Signed)
[name](THN) Care Management  03/09/2016  Dustin CoriaDennis R Mas 02-11-1956 [id number]   I reached out to Dustin [name] today to follow up on his respiratory symptoms. He saw his primary care doctor last week and had a chest x ray that Dustin [name] said the doctor told him was unchanged. Dustin [name] said his doctor then prescribed him Levaquin and another course of oral steroids (taper) for treatment of a sinusitis. Dustin [name] has been taking his medications as directed since Wednesday and will finish Levaquin tomorrow but has 3 more days of steroids. Dustin [name] says he feels "wired up from the steroids" and is really struggling to sleep. When is up and walking about during the day he has no difficulty with breathing but says he still feels congested in his nose and can't "blow out anything". When he tries to lie down and sleep at night he says he's "so juiced up I get to breathing fast." He has been trying to sleep sitting up.   Dustin [name] says his doctor told him he may need to be referred to an ENT if the current treatment does not resolve his symptoms and Dustin [name] agrees that it is a good idea to let his doctor know about his progress so he can see if he wants him to come in earlier than his scheduled follow up date or be referred to the ENT. He says he hopes he can stop the steroids but will continue to take them until directed otherwise by his doctor.   Plan: I will provide the information as outlined above to Dustin [name]'s primary care provider, Dr. Beverely RisenFozia Khan and will follow up with Dustin [name] again next week. Dustin [name] will call his doctor's office or me if he has questions or concerns or new or worsened symptoms.    Marja Kayslisa Gilboy MHA,BSN,RN,CCM [facility] Care Management  [phone]     Plan: Dustin [name] will call his doctor if he has new or worsened symptoms or will call me if he needs any assistance. I will call Dustin [name] next week to follow up and we will schedule a  face to face visit for the following week at that time.

## 2016-03-15 DIAGNOSIS — I2510 Atherosclerotic heart disease of native coronary artery without angina pectoris: Secondary | ICD-10-CM | POA: Diagnosis not present

## 2016-03-15 DIAGNOSIS — I959 Hypotension, unspecified: Secondary | ICD-10-CM | POA: Diagnosis not present

## 2016-03-15 DIAGNOSIS — J9610 Chronic respiratory failure, unspecified whether with hypoxia or hypercapnia: Secondary | ICD-10-CM | POA: Diagnosis not present

## 2016-03-15 DIAGNOSIS — I5032 Chronic diastolic (congestive) heart failure: Secondary | ICD-10-CM | POA: Diagnosis not present

## 2016-03-15 DIAGNOSIS — Z9981 Dependence on supplemental oxygen: Secondary | ICD-10-CM | POA: Diagnosis not present

## 2016-03-15 DIAGNOSIS — J449 Chronic obstructive pulmonary disease, unspecified: Secondary | ICD-10-CM | POA: Diagnosis not present

## 2016-03-16 ENCOUNTER — Other Ambulatory Visit: Payer: Self-pay | Admitting: *Deleted

## 2016-03-16 DIAGNOSIS — I2510 Atherosclerotic heart disease of native coronary artery without angina pectoris: Secondary | ICD-10-CM | POA: Diagnosis not present

## 2016-03-16 DIAGNOSIS — J9610 Chronic respiratory failure, unspecified whether with hypoxia or hypercapnia: Secondary | ICD-10-CM | POA: Diagnosis not present

## 2016-03-16 DIAGNOSIS — I5032 Chronic diastolic (congestive) heart failure: Secondary | ICD-10-CM | POA: Diagnosis not present

## 2016-03-16 DIAGNOSIS — J449 Chronic obstructive pulmonary disease, unspecified: Secondary | ICD-10-CM | POA: Diagnosis not present

## 2016-03-16 DIAGNOSIS — I959 Hypotension, unspecified: Secondary | ICD-10-CM | POA: Diagnosis not present

## 2016-03-16 NOTE — Patient Outreach (Signed)
[name](THN) Care Management  03/16/2016  Hershal CoriaDennis R Nohr [birth year] [id number]  I reached out to Mr. Mccullar this afternoon to follow up on respiratory symptoms and sinusitis he has struggled with over the last few weeks. I was unable to reach [name] by phone or leave a message.   Plan: I will reach out to Mr. Reindel by phone to follow up. He has my contact information as well and has been advised to call with any or worsened symptoms.    Marja Kayslisa Katlyn Muldrew MHA,BSN,RN,CCM [facility] Care Management  [phone]

## 2016-03-22 ENCOUNTER — Other Ambulatory Visit: Payer: Self-pay | Admitting: Pharmacist

## 2016-03-22 DIAGNOSIS — I5032 Chronic diastolic (congestive) heart failure: Secondary | ICD-10-CM | POA: Diagnosis not present

## 2016-03-22 DIAGNOSIS — J449 Chronic obstructive pulmonary disease, unspecified: Secondary | ICD-10-CM | POA: Diagnosis not present

## 2016-03-22 DIAGNOSIS — J9610 Chronic respiratory failure, unspecified whether with hypoxia or hypercapnia: Secondary | ICD-10-CM | POA: Diagnosis not present

## 2016-03-22 DIAGNOSIS — Z9981 Dependence on supplemental oxygen: Secondary | ICD-10-CM | POA: Diagnosis not present

## 2016-03-22 DIAGNOSIS — I959 Hypotension, unspecified: Secondary | ICD-10-CM | POA: Diagnosis not present

## 2016-03-22 DIAGNOSIS — I2510 Atherosclerotic heart disease of native coronary artery without angina pectoris: Secondary | ICD-10-CM | POA: Diagnosis not present

## 2016-03-22 NOTE — Patient Outreach (Addendum)
[name](THN) Care Management  03/22/2016  Dustin CoriaDennis R Salsgiver 05-02-1956 [id number]  Attempted to reach patient to follow-up if he received patient assistance application for [employer] for albuterol inhaler.  No answer on phone, left HIPAA compliant message requesting call back.    Plan:  Will attempt to reach patient again with in the next week if no return call from patient.  Tommye StandardKevin Giovanni Biby, PharmD, [facility] WestBCACP Clinical Pharmacist [name] [phone]

## 2016-03-23 DIAGNOSIS — J432 Centrilobular emphysema: Secondary | ICD-10-CM | POA: Diagnosis not present

## 2016-03-23 DIAGNOSIS — G4730 Sleep apnea, unspecified: Secondary | ICD-10-CM | POA: Diagnosis not present

## 2016-03-23 DIAGNOSIS — J324 Chronic pansinusitis: Secondary | ICD-10-CM | POA: Diagnosis not present

## 2016-03-23 DIAGNOSIS — J9611 Chronic respiratory failure with hypoxia: Secondary | ICD-10-CM | POA: Diagnosis not present

## 2016-03-23 DIAGNOSIS — J449 Chronic obstructive pulmonary disease, unspecified: Secondary | ICD-10-CM | POA: Diagnosis not present

## 2016-03-24 ENCOUNTER — Other Ambulatory Visit: Payer: Self-pay | Admitting: Pharmacist

## 2016-03-24 ENCOUNTER — Encounter: Payer: Self-pay | Admitting: Pharmacist

## 2016-03-24 ENCOUNTER — Other Ambulatory Visit: Payer: Self-pay | Admitting: *Deleted

## 2016-03-24 ENCOUNTER — Encounter: Payer: Self-pay | Admitting: *Deleted

## 2016-03-24 ENCOUNTER — Other Ambulatory Visit (HOSPITAL_COMMUNITY): Payer: Self-pay | Admitting: Physician Assistant

## 2016-03-24 DIAGNOSIS — J324 Chronic pansinusitis: Secondary | ICD-10-CM

## 2016-03-24 NOTE — Patient Outreach (Signed)
[name](THN) Care Management  03/24/2016  Hershal CoriaDennis R Grabinski Jul 31, 1956 [id number]  Received a voicemail from patient in response to message for patient earlier in week.  Called patient back who verified his HIPAA details.  He states that he is not able to find the [employer] patient assistance application to apply for albuterol HFA and requested a new one be mailed out to him.  He states that he is receiving Dulera and Spiriva from the respective manufacturer patient assistance programs.   Plan:  Will mail patient a new copy of the [employer] patient assistance application.    Verified the address in chart is correct address per patient.  Will call patient in about 2 weeks to see if he completed application.    Tommye StandardKevin Ridhi Hoffert, PharmD, [facility] WashingtonBCACP Clinical Pharmacist [name] [phone]

## 2016-03-24 NOTE — Patient Outreach (Signed)
[name]N) Care Management  03/24/2016  Dustin [name] 1956/04/28 [id number]  I spoke with Dustin [name] by phone today to follow up on his respiratory condition and progress. He states he has continued to have what feels to him like "sinusitis". He reports that he is unable to breath well when he tries to lie down to sleep because his "nose and head" feel so full. He is being careful to avoid being outside during the warmer parts of the day and says he is very intolerant to exercise. He has 3 appointments with different providers over the next 3 weeks. He asks that I return a call to him after his appointments so we can assimilate all the information and regroup on his plan of care.   Plan: I will reach out to [name] by phone over the next 2 weeks.     Marja Kayslisa Rylon Poitra MHA,BSN,RN,CCM [facility] Care Management  [phone]

## 2016-03-25 ENCOUNTER — Emergency Department (HOSPITAL_COMMUNITY): Payer: Medicare Other

## 2016-03-25 ENCOUNTER — Inpatient Hospital Stay (HOSPITAL_COMMUNITY)
Admission: EM | Admit: 2016-03-25 | Discharge: 2016-03-31 | DRG: 291 | Disposition: A | Discharge status: Home Health | Payer: Medicare Other | Attending: Internal Medicine | Admitting: Internal Medicine

## 2016-03-25 ENCOUNTER — Encounter (HOSPITAL_COMMUNITY): Payer: Self-pay

## 2016-03-25 DIAGNOSIS — K5900 Constipation, unspecified: Secondary | ICD-10-CM | POA: Diagnosis not present

## 2016-03-25 DIAGNOSIS — J441 Chronic obstructive pulmonary disease with (acute) exacerbation: Secondary | ICD-10-CM | POA: Diagnosis present

## 2016-03-25 DIAGNOSIS — F419 Anxiety disorder, unspecified: Secondary | ICD-10-CM | POA: Diagnosis present

## 2016-03-25 DIAGNOSIS — D696 Thrombocytopenia, unspecified: Secondary | ICD-10-CM | POA: Diagnosis present

## 2016-03-25 DIAGNOSIS — J432 Centrilobular emphysema: Secondary | ICD-10-CM | POA: Diagnosis present

## 2016-03-25 DIAGNOSIS — I110 Hypertensive heart disease with heart failure: Principal | ICD-10-CM | POA: Diagnosis present

## 2016-03-25 DIAGNOSIS — J9622 Acute and chronic respiratory failure with hypercapnia: Secondary | ICD-10-CM | POA: Diagnosis present

## 2016-03-25 DIAGNOSIS — R0682 Tachypnea, not elsewhere classified: Secondary | ICD-10-CM | POA: Diagnosis not present

## 2016-03-25 DIAGNOSIS — Z9981 Dependence on supplemental oxygen: Secondary | ICD-10-CM

## 2016-03-25 DIAGNOSIS — I5033 Acute on chronic diastolic (congestive) heart failure: Secondary | ICD-10-CM | POA: Diagnosis not present

## 2016-03-25 DIAGNOSIS — J209 Acute bronchitis, unspecified: Secondary | ICD-10-CM | POA: Diagnosis present

## 2016-03-25 DIAGNOSIS — J440 Chronic obstructive pulmonary disease with acute lower respiratory infection: Secondary | ICD-10-CM | POA: Diagnosis present

## 2016-03-25 DIAGNOSIS — Z7951 Long term (current) use of inhaled steroids: Secondary | ICD-10-CM

## 2016-03-25 DIAGNOSIS — F17200 Nicotine dependence, unspecified, uncomplicated: Secondary | ICD-10-CM

## 2016-03-25 DIAGNOSIS — J439 Emphysema, unspecified: Secondary | ICD-10-CM | POA: Diagnosis not present

## 2016-03-25 DIAGNOSIS — J471 Bronchiectasis with (acute) exacerbation: Secondary | ICD-10-CM | POA: Diagnosis not present

## 2016-03-25 DIAGNOSIS — G4730 Sleep apnea, unspecified: Secondary | ICD-10-CM | POA: Diagnosis not present

## 2016-03-25 DIAGNOSIS — F17290 Nicotine dependence, other tobacco product, uncomplicated: Secondary | ICD-10-CM | POA: Diagnosis present

## 2016-03-25 DIAGNOSIS — J449 Chronic obstructive pulmonary disease, unspecified: Secondary | ICD-10-CM | POA: Diagnosis not present

## 2016-03-25 DIAGNOSIS — Z8249 Family history of ischemic heart disease and other diseases of the circulatory system: Secondary | ICD-10-CM | POA: Diagnosis not present

## 2016-03-25 DIAGNOSIS — J9621 Acute and chronic respiratory failure with hypoxia: Secondary | ICD-10-CM | POA: Diagnosis present

## 2016-03-25 DIAGNOSIS — I5032 Chronic diastolic (congestive) heart failure: Secondary | ICD-10-CM | POA: Diagnosis present

## 2016-03-25 DIAGNOSIS — K219 Gastro-esophageal reflux disease without esophagitis: Secondary | ICD-10-CM | POA: Diagnosis present

## 2016-03-25 DIAGNOSIS — I248 Other forms of acute ischemic heart disease: Secondary | ICD-10-CM | POA: Diagnosis present

## 2016-03-25 DIAGNOSIS — J9601 Acute respiratory failure with hypoxia: Secondary | ICD-10-CM | POA: Diagnosis present

## 2016-03-25 DIAGNOSIS — E872 Acidosis: Secondary | ICD-10-CM | POA: Diagnosis present

## 2016-03-25 DIAGNOSIS — R0603 Acute respiratory distress: Secondary | ICD-10-CM

## 2016-03-25 DIAGNOSIS — R0600 Dyspnea, unspecified: Secondary | ICD-10-CM | POA: Diagnosis not present

## 2016-03-25 DIAGNOSIS — F329 Major depressive disorder, single episode, unspecified: Secondary | ICD-10-CM | POA: Diagnosis present

## 2016-03-25 DIAGNOSIS — A4902 Methicillin resistant Staphylococcus aureus infection, unspecified site: Secondary | ICD-10-CM | POA: Diagnosis not present

## 2016-03-25 DIAGNOSIS — R0902 Hypoxemia: Secondary | ICD-10-CM

## 2016-03-25 HISTORY — DX: Essential (primary) hypertension: I10

## 2016-03-25 LAB — BLOOD GAS, ARTERIAL
ACID-BASE EXCESS: 3.6 mmol/L — AB (ref 0.0–2.0)
BICARBONATE: 24.5 meq/L — AB (ref 20.0–24.0)
Drawn by: 277331
EXPIRATORY PAP: 8
FIO2: 0.45
INSPIRATORY PAP: 12
LHR: 16 {breaths}/min
Mode: POSITIVE
O2 SAT: 96.7 %
PATIENT TEMPERATURE: 37
PCO2 ART: 87.1 mmHg — AB (ref 35.0–45.0)
PH ART: 7.183 — AB (ref 7.350–7.450)
PO2 ART: 109 mmHg — AB (ref 80.0–100.0)

## 2016-03-25 LAB — COMPREHENSIVE METABOLIC PANEL
ALT: 18 U/L (ref 17–63)
AST: 19 U/L (ref 15–41)
Albumin: 4.1 g/dL (ref 3.5–5.0)
Alkaline Phosphatase: 68 U/L (ref 38–126)
Anion gap: 4 — ABNORMAL LOW (ref 5–15)
BUN: 9 mg/dL (ref 6–20)
CHLORIDE: 102 mmol/L (ref 101–111)
CO2: 33 mmol/L — AB (ref 22–32)
CREATININE: 0.87 mg/dL (ref 0.61–1.24)
Calcium: 8.9 mg/dL (ref 8.9–10.3)
GFR calc non Af Amer: >60 mL/min (ref >60)
Glucose, Bld: 119 mg/dL — ABNORMAL HIGH (ref 65–99)
POTASSIUM: 4.2 mmol/L (ref 3.5–5.1)
Sodium: 139 mmol/L (ref 135–145)
Total Bilirubin: 1.6 mg/dL — ABNORMAL HIGH (ref 0.3–1.2)
Total Protein: 7.3 g/dL (ref 6.5–8.1)

## 2016-03-25 LAB — CBC WITH DIFFERENTIAL/PLATELET
Basophils Absolute: 0 10*3/uL (ref 0.0–0.1)
Basophils Relative: 0 %
EOS ABS: 0 10*3/uL (ref 0.0–0.7)
Eosinophils Relative: 0 %
HEMATOCRIT: 45.7 % (ref 39.0–52.0)
HEMOGLOBIN: 14.4 g/dL (ref 13.0–17.0)
LYMPHS ABS: 1.1 10*3/uL (ref 0.7–4.0)
LYMPHS PCT: 6 %
MCH: 32.8 pg (ref 26.0–34.0)
MCHC: 31.5 g/dL (ref 30.0–36.0)
MCV: 104.1 fL — AB (ref 78.0–100.0)
MONOS PCT: 8 %
Monocytes Absolute: 1.5 10*3/uL — ABNORMAL HIGH (ref 0.1–1.0)
NEUTROS PCT: 86 %
Neutro Abs: 15.8 10*3/uL — ABNORMAL HIGH (ref 1.7–7.7)
Platelets: 148 10*3/uL — ABNORMAL LOW (ref 150–400)
RBC: 4.39 MIL/uL (ref 4.22–5.81)
RDW: 13.5 % (ref 11.5–15.5)
WBC: 18.3 10*3/uL — AB (ref 4.0–10.5)

## 2016-03-25 LAB — MRSA PCR SCREENING: MRSA BY PCR: POSITIVE — AB

## 2016-03-25 LAB — BRAIN NATRIURETIC PEPTIDE: B NATRIURETIC PEPTIDE 5: 160 pg/mL — AB (ref 0.0–100.0)

## 2016-03-25 LAB — I-STAT CG4 LACTIC ACID, ED: Lactic Acid, Venous: 0.75 mmol/L (ref 0.5–1.9)

## 2016-03-25 LAB — TROPONIN I
TROPONIN I: 0.06 ng/mL — AB (ref <0.03)
Troponin I: 0.03 ng/mL (ref <0.03)

## 2016-03-25 MED ORDER — ACETAMINOPHEN 650 MG RE SUPP: 650.0000 mg | Freq: Four times a day (QID) | RECTAL | Status: DC | PRN

## 2016-03-25 MED ORDER — SODIUM CHLORIDE 0.9% FLUSH: 3.0000 mL | INTRAVENOUS | Status: DC | PRN

## 2016-03-25 MED ORDER — ALBUTEROL SULFATE (2.5 MG/3ML) 0.083% IN NEBU: 5.0000 mg | INHALATION_SOLUTION | Freq: Once | RESPIRATORY_TRACT | Status: DC

## 2016-03-25 MED ORDER — ESCITALOPRAM OXALATE 10 MG PO TABS
20.0000 mg | ORAL_TABLET | Freq: Every day | ORAL | Status: DC
  Administered 2016-03-25 – 2016-03-30 (×6): 20 mg via ORAL
  Filled 2016-03-25 (×6): qty 2

## 2016-03-25 MED ORDER — ALPRAZOLAM 1 MG PO TABS
1.0000 mg | ORAL_TABLET | Freq: Three times a day (TID) | ORAL | Status: DC | PRN
  Administered 2016-03-25 – 2016-03-31 (×11): 1 mg via ORAL
  Filled 2016-03-25 (×2): qty 1
  Filled 2016-03-25: qty 2
  Filled 2016-03-25 (×2): qty 1
  Filled 2016-03-25 (×3): qty 2
  Filled 2016-03-25 (×2): qty 1
  Filled 2016-03-25: qty 2

## 2016-03-25 MED ORDER — SODIUM CHLORIDE 0.9% FLUSH
3.0000 mL | Freq: Two times a day (BID) | INTRAVENOUS | Status: DC
  Administered 2016-03-27 – 2016-03-30 (×6): 3 mL via INTRAVENOUS

## 2016-03-25 MED ORDER — ACETAMINOPHEN 325 MG PO TABS
650.0000 mg | ORAL_TABLET | Freq: Four times a day (QID) | ORAL | Status: DC | PRN
  Administered 2016-03-28: 650 mg via ORAL
  Filled 2016-03-25: qty 2

## 2016-03-25 MED ORDER — SODIUM CHLORIDE 0.9 % IV SOLN: 250.0000 mL | INTRAVENOUS | Status: DC | PRN

## 2016-03-25 MED ORDER — THEOPHYLLINE ER 400 MG PO TB24
400.0000 mg | ORAL_TABLET | Freq: Two times a day (BID) | ORAL | Status: DC
  Administered 2016-03-26 – 2016-03-31 (×11): 400 mg via ORAL
  Filled 2016-03-25 (×19): qty 1

## 2016-03-25 MED ORDER — DEXTROSE 5 % IV SOLN
500.0000 mg | INTRAVENOUS | Status: DC
  Administered 2016-03-25 – 2016-03-27 (×3): 500 mg via INTRAVENOUS
  Filled 2016-03-25 (×4): qty 500

## 2016-03-25 MED ORDER — CHLORHEXIDINE GLUCONATE CLOTH 2 % EX PADS
6.0000 | MEDICATED_PAD | Freq: Every day | CUTANEOUS | Status: AC
  Administered 2016-03-26 – 2016-03-30 (×5): 6 via TOPICAL

## 2016-03-25 MED ORDER — DOXAZOSIN MESYLATE 2 MG PO TABS
4.0000 mg | ORAL_TABLET | Freq: Every day | ORAL | Status: DC
  Administered 2016-03-25 – 2016-03-26 (×2): 4 mg via ORAL
  Filled 2016-03-25 (×2): qty 2

## 2016-03-25 MED ORDER — ALBUTEROL (5 MG/ML) CONTINUOUS INHALATION SOLN
INHALATION_SOLUTION | RESPIRATORY_TRACT | Status: AC
  Administered 2016-03-25: 12:00:00
  Filled 2016-03-25: qty 20

## 2016-03-25 MED ORDER — ENOXAPARIN SODIUM 40 MG/0.4ML ~~LOC~~ SOLN
40.0000 mg | SUBCUTANEOUS | Status: DC
  Administered 2016-03-25 – 2016-03-26 (×2): 40 mg via SUBCUTANEOUS
  Filled 2016-03-25 (×2): qty 0.4

## 2016-03-25 MED ORDER — CEFTRIAXONE SODIUM 1 G IJ SOLR
1.0000 g | Freq: Once | INTRAMUSCULAR | Status: AC
  Administered 2016-03-25: 1 g via INTRAVENOUS
  Filled 2016-03-25: qty 10

## 2016-03-25 MED ORDER — FUROSEMIDE 10 MG/ML IJ SOLN
40.0000 mg | Freq: Once | INTRAMUSCULAR | Status: AC
  Administered 2016-03-25: 40 mg via INTRAVENOUS
  Filled 2016-03-25: qty 4

## 2016-03-25 MED ORDER — ALBUTEROL (5 MG/ML) CONTINUOUS INHALATION SOLN: 5.0000 mg/h | INHALATION_SOLUTION | RESPIRATORY_TRACT | Status: DC

## 2016-03-25 MED ORDER — SODIUM CHLORIDE 0.9% FLUSH
3.0000 mL | Freq: Two times a day (BID) | INTRAVENOUS | Status: DC
  Administered 2016-03-25 – 2016-03-31 (×9): 3 mL via INTRAVENOUS

## 2016-03-25 MED ORDER — FUROSEMIDE 10 MG/ML IJ SOLN
40.0000 mg | Freq: Two times a day (BID) | INTRAMUSCULAR | Status: DC
  Administered 2016-03-25 – 2016-03-26 (×2): 40 mg via INTRAVENOUS
  Filled 2016-03-25 (×2): qty 4

## 2016-03-25 MED ORDER — GABAPENTIN 300 MG PO CAPS
600.0000 mg | ORAL_CAPSULE | Freq: Three times a day (TID) | ORAL | Status: DC
  Administered 2016-03-25 – 2016-03-31 (×19): 600 mg via ORAL
  Filled 2016-03-25 (×18): qty 2
  Filled 2016-03-25: qty 6

## 2016-03-25 MED ORDER — IPRATROPIUM-ALBUTEROL 0.5-2.5 (3) MG/3ML IN SOLN
3.0000 mL | Freq: Four times a day (QID) | RESPIRATORY_TRACT | Status: DC
  Administered 2016-03-25 – 2016-03-30 (×19): 3 mL via RESPIRATORY_TRACT
  Filled 2016-03-25 (×19): qty 3

## 2016-03-25 MED ORDER — METHYLPREDNISOLONE SODIUM SUCC 40 MG IJ SOLR
40.0000 mg | Freq: Four times a day (QID) | INTRAMUSCULAR | Status: DC
  Administered 2016-03-25 – 2016-03-31 (×23): 40 mg via INTRAVENOUS
  Filled 2016-03-25 (×23): qty 1

## 2016-03-25 MED ORDER — ALBUTEROL SULFATE (2.5 MG/3ML) 0.083% IN NEBU: 2.5000 mg | INHALATION_SOLUTION | RESPIRATORY_TRACT | Status: DC | PRN

## 2016-03-25 MED ORDER — MUPIROCIN 2 % EX OINT
1.0000 "application " | TOPICAL_OINTMENT | Freq: Two times a day (BID) | CUTANEOUS | Status: AC
  Administered 2016-03-25 – 2016-03-30 (×10): 1 via NASAL
  Filled 2016-03-25 (×3): qty 22

## 2016-03-25 NOTE — ED Notes (Addendum)
EMS reports called to home due to pt with SOB. Pt reports SOB worsening over last 2 days. Pt reports SOB since 3am. Pale and diaphoretic upon EMS arrival. Albuterol 2.5. duoneb and solumedrol 125 mg adm. Bpap initiated

## 2016-03-25 NOTE — Progress Notes (Signed)
CRITICAL VALUE ALERT  Critical value received: troponin 0.06  Date of notification: 03/25/16  Time of notification:  1751  Critical value read back:yes  Nurse who received alert:  E.Oziah Vitanza RN  MD notified (1st page):  Dr. Irene LimboGoodrich   Time of first page: 1840  MD notified (2nd page):  Time of second page:  Responding MD:  Dr. Irene LimboGoodrich  Time MD responded: [phone]

## 2016-03-25 NOTE — ED Provider Notes (Addendum)
History  By signing my name below, I, Earmon PhoenixJennifer Waddell, attest that this documentation has been prepared under the direction and in the presence of Donnetta HutchingBrian Keva Darty, MD. Electronically Signed: Earmon PhoenixJennifer Waddell, ED Scribe. 03/25/2016. 12:57 PM  Chief Complaint  Patient presents with  . Respiratory Distress   The history is provided by the patient and medical records. No language interpreter was used.    LEVEL 5 CAVEAT- Full history could not be obtained due to respiratory distress.   HPI Comments:  Hershal CoriaDennis R Digiulio is a 60 y.o. male  With PMHx of asthma, COPD and CHF who presents to the Emergency Department complaining of respiratory distress that began about 8 hours ago. EMS states when they arrived pt was tripoding and his oxygen saturations were in the 80s. He received SoluMedrol 125 mg and was put on CPAP machine. He was also given breathing treatments PTA. Pt denies any CP. Pt states he has recently been sick with cold symptoms.  Past Medical History  Diagnosis Date  . Asthma   . COPD (chronic obstructive pulmonary disease) (HCC)   . Allergy   . Anxiety   . Chronic diastolic CHF (congestive heart failure) (HCC)     a. echo 07/2013: EF 60-65%, DD, biatrial dilatation, Ao sclerosis, dilated RV, moderate pulmonary HTN, elevated CV and RA pressures; b. patient reported echo at Dr. Milta DeitersKhan's office 02/2015 - his office does not have record of him being a pt there c. echo 11/2015: EF 60-65%, Grade 1 DD, mod-severe pulm pressures  . Depression   . Emphysema of lung (HCC)   . GERD (gastroesophageal reflux disease)   . Tobacco abuse   . Depression   . Chronic respiratory failure (HCC)     a. on 2L via nasal cannula; b. secondary to COPD  . Personal history of tobacco use, presenting hazards to health 08/17/2015  . Anxiety   . Depression   . Hypertension    Past Surgical History  Procedure Laterality Date  . Abdominal surgery    . Nose surgery    . Hemorrh     Family History  Problem  Relation Age of Onset  . Diabetes Neg Hx   . CAD Father    Social History  Substance Use Topics  . Smoking status: Current Some Day Smoker -- 1.50 packs/day for 43 years    Types: E-cigarettes  . Smokeless tobacco: None  . Alcohol Use: No   LEVEL 5 CAVEAT- Full history could not be obtained due to respiratory distress.   Review of Systems  Unable to perform ROS: Severe respiratory distress    Allergies  Asa; Codeine; and Ibuprofen  Home Medications   Prior to Admission medications   Medication Sig Start Date End Date Taking? Authorizing Provider  Albuterol Sulfate (PROAIR RESPICLICK) 108 (90 BASE) MCG/ACT AEPB Inhale 2 puffs into the lungs every 4 (four) hours as needed (for shortness of breath). Reported on 12/10/2015   Yes Historical Provider, MD  ALPRAZolam Prudy Feeler(XANAX) 1 MG tablet Take 1 tablet (1 mg total) by mouth 3 (three) times daily as needed for anxiety. 12/06/15  Yes Enedina FinnerSona Patel, MD  doxazosin (CARDURA) 4 MG tablet Take 4 mg by mouth daily. Reported on 12/10/2015   Yes Historical Provider, MD  escitalopram (LEXAPRO) 20 MG tablet Take 20 mg by mouth at bedtime.    Yes Historical Provider, MD  gabapentin (NEURONTIN) 600 MG tablet Take 600 mg by mouth 3 (three) times daily.   Yes Historical Provider, MD  ipratropium-albuterol (  DUONEB) 0.5-2.5 (3) MG/3ML SOLN Take 3 mLs by nebulization 4 (four) times daily as needed (for shortness of breath).   Yes Historical Provider, MD  mometasone-formoterol (DULERA) 200-5 MCG/ACT AERO Inhale 2 puffs into the lungs 2 (two) times daily.   Yes Historical Provider, MD  pseudoephedrine-guaifenesin (MUCINEX D) 60-600 MG 12 hr tablet Take 1 tablet by mouth every 12 (twelve) hours.   Yes Historical Provider, MD  theophylline (UNIPHYL) 400 MG 24 hr tablet Take 400 mg by mouth 2 (two) times daily.   Yes Historical Provider, MD  Tiotropium Bromide Monohydrate (SPIRIVA RESPIMAT) 2.5 MCG/ACT AERS Inhale 1 puff into the lungs daily.   Yes Historical Provider, MD    Triage Vitals: BP 147/93 mmHg  Pulse 130  Resp 26  Wt 183 lb (83.008 kg)  SpO2 98% Physical Exam  Constitutional: He is oriented to person, place, and time. He appears well-developed and well-nourished.  Bipap in place  HENT:  Head: Normocephalic and atraumatic.  Eyes: Conjunctivae are normal.  Neck: Neck supple.  Cardiovascular: Normal rate and regular rhythm.   Pulmonary/Chest: He is in respiratory distress. He has rhonchi.  Mild tachypnea. No dyspnea. Scattered rhonchi.  Abdominal: Soft. Bowel sounds are normal.  Musculoskeletal: Normal range of motion.  Neurological: He is alert and oriented to person, place, and time.  Skin: Skin is warm and dry.  Psychiatric: He has a normal mood and affect. His behavior is normal.  Nursing note and vitals reviewed.   ED Course  Procedures (including critical care time) DIAGNOSTIC STUDIES: Oxygen Saturation is 98% on CPAP, normal by my interpretation.   COORDINATION OF CARE: 11:08 AM- Will order labs, CXR, EKG and nebulizer treatment. Pt verbalizes understanding and agrees to plan.  Medications  albuterol (PROVENTIL) (2.5 MG/3ML) 0.083% nebulizer solution 5 mg (5 mg Nebulization Not Given 03/25/16 1136)  albuterol (PROVENTIL,VENTOLIN) solution continuous neb (5 mg/hr Nebulization Not Given 03/25/16 1139)  albuterol (PROVENTIL, VENTOLIN) (5 MG/ML) 0.5% continuous inhalation solution (  Given 03/25/16 1136)  furosemide (LASIX) injection 40 mg (40 mg Intravenous Given 03/25/16 1249)    Labs Review Labs Reviewed  COMPREHENSIVE METABOLIC PANEL - Abnormal; Notable for the following:    CO2 33 (*)    Glucose, Bld 119 (*)    Total Bilirubin 1.6 (*)    Anion gap 4 (*)    All other components within normal limits  CBC WITH DIFFERENTIAL/PLATELET - Abnormal; Notable for the following:    WBC 18.3 (*)    MCV 104.1 (*)    Platelets 148 (*)    Neutro Abs 15.8 (*)    Monocytes Absolute 1.5 (*)    All other components within normal limits  BRAIN  NATRIURETIC PEPTIDE - Abnormal; Notable for the following:    B Natriuretic Peptide 160.0 (*)    All other components within normal limits  TROPONIN I - Abnormal; Notable for the following:    Troponin I 0.03 (*)    All other components within normal limits  BLOOD GAS, ARTERIAL - Abnormal; Notable for the following:    pH, Arterial 7.183 (*)    pCO2 arterial 87.1 (*)    pO2, Arterial 109 (*)    Bicarbonate 24.5 (*)    Acid-Base Excess 3.6 (*)    All other components within normal limits  I-STAT CG4 LACTIC ACID, ED    Imaging Review Dg Chest 1 View  03/25/2016  CLINICAL DATA:  Respiratory distress EXAM: CHEST 1 VIEW COMPARISON:  None. FINDINGS: There is evidence  of bullous disease in the upper lobes bilaterally. Elsewhere, there is interstitial prominence, concerning for a degree of pulmonary edema. There is cardiomegaly. The pulmonary vascularity is reflective of the underlying bullous disease in the upper lobes. No adenopathy evident. IMPRESSION: Mild cardiomegaly. Suspect interstitial edema. These findings would be indicative of a degree of underlying congestive heart failure. No airspace consolidation. There is extensive bullous disease in the upper lobes. Electronically Signed   By: Bretta Bang III M.D.   On: 03/25/2016 11:08   I have personally reviewed and evaluated these images and lab results as part of my medical decision-making.   EKG Interpretation None     CRITICAL CARE Performed by: Donnetta Hutching Total critical care time: 35 minutes Critical care time was exclusive of separately billable procedures and treating other patients. Critical care was necessary to treat or prevent imminent or life-threatening deterioration. Critical care was time spent personally by me on the following activities: development of treatment plan with patient and/or surrogate as well as nursing, discussions with consultants, evaluation of patient's response to treatment, examination of patient,  obtaining history from patient or surrogate, ordering and performing treatments and interventions, ordering and review of laboratory studies, ordering and review of radiographic studies, pulse oximetry and re-evaluation of patient's condition. MDM   Final diagnoses:  COPD exacerbation (HCC)   BiPAP initiated by EMS which has been continued in the emergency department. Chest x-ray suggests possible interstitial edema. No obvious airspace consolidation. IV steroids and DuoNeb nebulizer treatment administered. Will add IV antibiotics secondary to productive sputum and immunocompromise state. Patient has tolerated BiPAP well. Patient is retaining CO2 , but this has been noted in the past. Admit to general medicine.  I personally performed the services described in this documentation, which was scribed in my presence. The recorded information has been reviewed and is accurate.      Donnetta Hutching, MD 03/25/16 1413  Donnetta Hutching, MD 03/25/16 1450

## 2016-03-25 NOTE — H&P (Signed)
History and Physical  AUBURN HERT RUE:[id number] DOB: October 20, 1955 DOA: 03/25/2016  PCP: Lyndon Code, MD  Patient coming from: home  Chief Complaint: short of breaht  HPI:  60 year old man presented to the emergency department with shortness of breath, noted to be hypoxic with acute respiratory failure, started on BiPAP, admitted for acute COPD and CHF exacerbation.  2 week history of increasing productive cough, shortness of breath and orthopnea. Also lower extremity edema. Became quite short of breath this morning came to the emergency department for further evaluation. Currently on BiPAP and history somewhat limited by respiratory status. Continues to smoke.  In the emergency department: afebrile, HR 120s, SBP high. Treated with BiPAP, Lasix, albuterol Pertinent labs: abg 7.183/87/109, CMP unremarkable, troponin 0.03, lactic acid within normal limits, WBC 18.3, platelets 148 Imaging: Chest x-ray independently reviewed. Interstitial edema.  Review of Systems:  Negative for visual changes, sore throat, rash, new muscle aches, chest pain,  dysuria, bleeding, abdominal pain.  Past Medical History  Diagnosis Date  . Asthma   . COPD (chronic obstructive pulmonary disease) (HCC)   . Allergy   . Anxiety   . Chronic diastolic CHF (congestive heart failure) (HCC)     a. echo 07/2013: EF 60-65%, DD, biatrial dilatation, Ao sclerosis, dilated RV, moderate pulmonary HTN, elevated CV and RA pressures; b. patient reported echo at Dr. Milta Deiters office 02/2015 - his office does not have record of him being a pt there c. echo 11/2015: EF 60-65%, Grade 1 DD, mod-severe pulm pressures  . Depression   . Emphysema of lung (HCC)   . GERD (gastroesophageal reflux disease)   . Tobacco abuse   . Depression   . Chronic respiratory failure (HCC)     a. on 2L via nasal cannula; b. secondary to COPD  . Personal history of tobacco use, presenting hazards to health 08/17/2015  . Anxiety   . Depression   .  Hypertension     Past Surgical History  Procedure Laterality Date  . Abdominal surgery    . Nose surgery    . Hemorrh       reports that he has been smoking E-cigarettes.  He has a 64.5 pack-year smoking history. He does not have any smokeless tobacco history on file. He reports that he does not drink alcohol or use illicit drugs.   Allergies  Allergen Reactions  . Asa [Aspirin] Other (See Comments)    Reaction: swelling of the right side.  . Codeine Other (See Comments)    Reaction: Difficulty breathing  . Ibuprofen Other (See Comments)    Reaction: Swelling of the right side.    Family History  Problem Relation Age of Onset  . Diabetes Neg Hx   . CAD Father      Prior to Admission medications   Medication Sig Start Date End Date Taking? Authorizing Provider  Albuterol Sulfate (PROAIR RESPICLICK) 108 (90 BASE) MCG/ACT AEPB Inhale 2 puffs into the lungs every 4 (four) hours as needed (for shortness of breath). Reported on 12/10/2015   Yes Historical Provider, MD  ALPRAZolam Prudy Feeler) 1 MG tablet Take 1 tablet (1 mg total) by mouth 3 (three) times daily as needed for anxiety. 12/06/15  Yes Enedina Finner, MD  doxazosin (CARDURA) 4 MG tablet Take 4 mg by mouth daily. Reported on 12/10/2015   Yes Historical Provider, MD  escitalopram (LEXAPRO) 20 MG tablet Take 20 mg by mouth at bedtime.    Yes Historical Provider, MD  gabapentin (NEURONTIN) 600 MG  tablet Take 600 mg by mouth 3 (three) times daily.   Yes Historical Provider, MD  ipratropium-albuterol (DUONEB) 0.5-2.5 (3) MG/3ML SOLN Take 3 mLs by nebulization 4 (four) times daily as needed (for shortness of breath).   Yes Historical Provider, MD  mometasone-formoterol (DULERA) 200-5 MCG/ACT AERO Inhale 2 puffs into the lungs 2 (two) times daily.   Yes Historical Provider, MD  pseudoephedrine-guaifenesin (MUCINEX D) 60-600 MG 12 hr tablet Take 1 tablet by mouth every 12 (twelve) hours.   Yes Historical Provider, MD  theophylline (UNIPHYL)  400 MG 24 hr tablet Take 400 mg by mouth 2 (two) times daily.   Yes Historical Provider, MD  Tiotropium Bromide Monohydrate (SPIRIVA RESPIMAT) 2.5 MCG/ACT AERS Inhale 1 puff into the lungs daily.   Yes Historical Provider, MD    Physical Exam: Filed Vitals:   03/25/16 1345 03/25/16 1400 03/25/16 1415 03/25/16 1430  BP: 142/98 134/91 132/93 135/100  Pulse: 119 117 117 119  Temp:      TempSrc:      Resp: 26 22 24 27   Height:      Weight:      SpO2: 97% 99% 99% 98%   Constitutional:  . On BiPAP. Appears well but not toxic. Eyes:  . PERRL and irises appear normal . Normal conjunctivae and lids ENMT:  . grossly normal hearing . Lips appear normal Neck:  . neck appears normal, no masses, normal ROM, supple . no thyromegaly Respiratory:  . Poor air movement, no frank wheezes, rales or rhonchi. . Moderate increased respiratory effort. Some accessory muscle usage. Cardiovascular:  . RRR, no m/r/g . 2+ bilateral LE extremity edema   Abdomen:  . Abdomen appears normal; no tenderness or masses . Soft umbilical hernia easily reducible, nontender Musculoskeletal:  . RUE, LUE, RLE, LLE   o strength and tone normal, no atrophy, no abnormal movements o No tenderness, masses Skin:  . No rashes, lesions, ulcers . palpation of skin: no induration or nodules Psychiatric:  . judgement and insight appear normal . Mental status o Mood, affect appropriate  Wt Readings from Last 3 Encounters:  03/25/16 83.008 kg (183 lb)  12/06/15 83.263 kg (183 lb 9 oz)  08/18/15 87.998 kg (194 lb)    I have personally reviewed following labs and imaging studies  Labs on Admission:  CBC:  Recent Labs Lab 03/25/16 1114  WBC 18.3*  NEUTROABS 15.8*  HGB 14.4  HCT 45.7  MCV 104.1*  PLT 148*   Basic Metabolic Panel:  Recent Labs Lab 03/25/16 1114  NA 139  K 4.2  CL 102  CO2 33*  GLUCOSE 119*  BUN 9  CREATININE 0.87  CALCIUM 8.9   Liver Function Tests:  Recent Labs Lab  03/25/16 1114  AST 19  ALT 18  ALKPHOS 68  BILITOT 1.6*  PROT 7.3  ALBUMIN 4.1   Cardiac Enzymes:  Recent Labs Lab 03/25/16 1114  TROPONINI 0.03*   Radiological Exams on Admission: Dg Chest 1 View  03/25/2016  CLINICAL DATA:  Respiratory distress EXAM: CHEST 1 VIEW COMPARISON:  None. FINDINGS: There is evidence of bullous disease in the upper lobes bilaterally. Elsewhere, there is interstitial prominence, concerning for a degree of pulmonary edema. There is cardiomegaly. The pulmonary vascularity is reflective of the underlying bullous disease in the upper lobes. No adenopathy evident. IMPRESSION: Mild cardiomegaly. Suspect interstitial edema. These findings would be indicative of a degree of underlying congestive heart failure. No airspace consolidation. There is extensive bullous disease in the  upper lobes. Electronically Signed   By: Bretta Bang III M.D.   On: 03/25/2016 11:08    EKG: Independently reviewed. Sinus tachycardia, right bundle branch block, left anterior fascicular block. Compared to previous EKG, right bundle branch block old.  Principal Problem:   Acute respiratory failure with hypoxia (HCC) Active Problems:   COPD with acute exacerbation (HCC)   Acute on chronic diastolic CHF (congestive heart failure) (HCC)   Chronic diastolic heart failure (HCC)   Centrilobular emphysema (HCC)   Acute on chronic respiratory failure with hypercapnia (HCC)   Tobacco use disorder   Assessment/Plan 1. Acute on chronic hypoxic respiratory failure with acute respiratory acidosis and hypercapnia secondary to COPD exacerbation, acute bronchitis, acute on chronic diastolic heart failure. 2. Acute COPD exacerbation/bronchitis 3. Acute on chronic diastolic congestive heart failure 4. Tobacco use disorder   Acutely ill. Admit to stepdown unit on BiPAP.  Steroids, antibiotics, bronchodilators  IV diuresis, serial BMP  Recommend smoking cessation  DVT  prophylaxis:Lovenox Code Status: full code Family Communication: none Disposition Plan: admit   Consults called: none      Time spent: 50 minutes  Brendia Sacks, MD  [facility] Direct contact: [phone] --Via amion app OR  --[email or web address]; password [email or web address]  7PM-7AM contact night coverage as above  03/25/2016, 2:57 PM

## 2016-03-25 NOTE — ED Notes (Signed)
Dr Adriana Simasook notified of Critical ABG labs 1:00 PM

## 2016-03-26 LAB — BASIC METABOLIC PANEL
ANION GAP: 10 (ref 5–15)
BUN: 23 mg/dL — ABNORMAL HIGH (ref 6–20)
CHLORIDE: 97 mmol/L — AB (ref 101–111)
CO2: 31 mmol/L (ref 22–32)
CREATININE: 1.4 mg/dL — AB (ref 0.61–1.24)
Calcium: 9.2 mg/dL (ref 8.9–10.3)
GFR calc non Af Amer: 52 mL/min — ABNORMAL LOW (ref >60)
Glucose, Bld: 140 mg/dL — ABNORMAL HIGH (ref 65–99)
POTASSIUM: 5 mmol/L (ref 3.5–5.1)
SODIUM: 138 mmol/L (ref 135–145)

## 2016-03-26 LAB — TROPONIN I
TROPONIN I: 0.03 ng/mL — AB (ref <0.03)
TROPONIN I: 0.03 ng/mL — AB (ref <0.03)

## 2016-03-26 MED ORDER — SODIUM CHLORIDE 0.9 % IV BOLUS (SEPSIS)
500.0000 mL | Freq: Once | INTRAVENOUS | Status: AC
  Administered 2016-03-26: 500 mL via INTRAVENOUS

## 2016-03-26 NOTE — Progress Notes (Signed)
PROGRESS NOTE  Dustin [name]:[id number] DOB: 06/17/1956 DOA: 03/25/2016 PCP: Lyndon Code, MD  Brief Narrative: 71 yom with a hx of COPD and CHF presented to the ED with shortness of breath. He was found to be hypoxic with acute respiratory failure and subsequently started on BiPAP and admitted for acute COPD and CHF exacerbation.   Assessment/Plan: 1. Acute on chronic hypoxic respiratory failure with acute respiratory acidosis and hypercapnia secondary to COPD exacerbation, acute bronchitis, acute on chronic diastolic heart failure. Clinically improved. 2. Acute COPD exacerbation/bronchitis. Improving. Currently on high flow nasal cannula. 3. Acute on chronic diastolic congestive heart failure. Minimal lower extremity edema. Close to euvolemic. 4. Elevated troponin due to demand ischemia.  5. Tobacco use disorder.    Overall improved. Will continue steroids, bronchodilators. Wean O2 back to chronic requirement as tolerated. Will d/c diuresis and encourage fluid intake. Plan BiPAP tonight. Possible transfer to floor tomorrow.  DVT prophylaxis: Lovenox Code Status: Full Family Communication: discussed with patient. Brother and son present at bedside. Disposition Plan: Discharge home once improved  Brendia Sacks, MD  [facility] Direct contact: [phone] --Via amion app OR  --[email or web address]; password [email or web address]  7PM-7AM contact night coverage as above 03/26/2016, 5:25 AM  LOS: 1 day   Consultants:  none  Procedures:  BiPAP 7/08 >>  Antimicrobials:  Zithromax 7/08 >>  Ceftriaxone 7/8 >>   HPI/Subjective: Feels improved today. Reports occasional rest last night. Feels as though his breathing has improved since discontinuing CPAP. Reports some nausea, but denies vomiting.. Reports productive cough  Objective: Filed Vitals:   03/25/16 2200 03/25/16 2300 03/26/16 0000 03/26/16 0359  BP: 109/76 99/70 96/76    Pulse: 113 117 120   Temp:   98.1 F (36.7 C)     TempSrc:   Axillary   Resp: Height:      Weight:      SpO2: 94% 93% 92% 94%    Intake/Output Summary (Last 24 hours) at 03/26/16 0525 Last data filed at 03/25/16 2200  Gross per 24 hour  Intake    490 ml  Output    825 ml  Net   -335 ml     Filed Weights   03/25/16 1102 03/25/16 1703  Weight: 83.008 kg (183 lb) 83 kg (182 lb 15.7 oz)    Exam:    Constitutional:  . Appears calm and comfortable Eyes:  . PERRL and irises appear normal . Conjunctivae and lids appear normal ENMT:  . external ears, nose appear normal . grossly normal hearing  . Lips appear normal; teeth normal, gums normal . Oropharynx: mucosa, tongue,posterior pharynx appear normal Respiratory:  . no w/r/r.  diminshed breath sounds  .  mild increse resp, able to speak in full sentences Cardiovascular:  . Tachycardic, no m/r/g . 1+ edema bilateral . Telemetry SR/ST  Abdomen:  . Abdomen appears normal; no tenderness or masses, non-distended Psychiatric:  . judgement and insight appear normal . Mental status o Mood, affect appropriate  I have personally reviewed following labs and imaging studies:  Sodium 138  Potassium 50  BUN 23, Cr 1.40, increasing with diuresis.  Glucose 140  Troponin 0.03, trending down/ flat  Scheduled Meds: . azithromycin  500 mg Intravenous Q24H  . Chlorhexidine Gluconate Cloth  6 each Topical Q0600  . doxazosin  4 mg Oral Daily  . enoxaparin (LOVENOX) injection  40 mg Subcutaneous Q24H  . escitalopram  20 mg Oral QHS  . furosemide  40  mg Intravenous Q12H  . gabapentin  600 mg Oral TID  . ipratropium-albuterol  3 mL Nebulization Q6H  . methylPREDNISolone (SOLU-MEDROL) injection  40 mg Intravenous Q6H  . mupirocin ointment  1 application Nasal BID  . sodium chloride flush  3 mL Intravenous Q12H  . sodium chloride flush  3 mL Intravenous Q12H  . theophylline  400 mg Oral BID   Continuous Infusions:   Principal Problem:   Acute respiratory  failure with hypoxia (HCC) Active Problems:   COPD with acute exacerbation (HCC)   Acute on chronic diastolic CHF (congestive heart failure) (HCC)   Chronic diastolic heart failure (HCC)   Centrilobular emphysema (HCC)   Acute on chronic respiratory failure with hypercapnia (HCC)   Tobacco use disorder   LOS: 1 day   Time spent 25 minutes  By signing my name below, I, Adron BeneGreylon Gawaluck, attest that this documentation has been prepared under the direction and in the presence of Daniel P. Irene LimboGoodrich, MD. Electronically Signed: Adron BeneGreylon Gawaluck, Scribe.  03/26/2016 9:00am     I personally performed the services described in this documentation. All medical record entries made by the scribe were at my direction. I have reviewed the chart and agree that the record reflects my personal performance and is accurate and complete. Brendia Sacksaniel Goodrich, MD

## 2016-03-27 ENCOUNTER — Other Ambulatory Visit: Payer: Self-pay | Admitting: *Deleted

## 2016-03-27 LAB — CBC
HEMATOCRIT: 39 % (ref 39.0–52.0)
Hemoglobin: 12.2 g/dL — ABNORMAL LOW (ref 13.0–17.0)
MCH: 32.3 pg (ref 26.0–34.0)
MCHC: 31.3 g/dL (ref 30.0–36.0)
MCV: 103.2 fL — AB (ref 78.0–100.0)
PLATELETS: 117 10*3/uL — AB (ref 150–400)
RBC: 3.78 MIL/uL — AB (ref 4.22–5.81)
RDW: 13.2 % (ref 11.5–15.5)
WBC: 9.8 10*3/uL (ref 4.0–10.5)

## 2016-03-27 LAB — BASIC METABOLIC PANEL
ANION GAP: 6 (ref 5–15)
BUN: 38 mg/dL — ABNORMAL HIGH (ref 6–20)
CALCIUM: 9.2 mg/dL (ref 8.9–10.3)
CHLORIDE: 98 mmol/L — AB (ref 101–111)
CO2: 34 mmol/L — AB (ref 22–32)
Creatinine, Ser: 1.31 mg/dL — ABNORMAL HIGH (ref 0.61–1.24)
GFR calc non Af Amer: 57 mL/min — ABNORMAL LOW (ref >60)
Glucose, Bld: 145 mg/dL — ABNORMAL HIGH (ref 65–99)
POTASSIUM: 4.5 mmol/L (ref 3.5–5.1)
Sodium: 138 mmol/L (ref 135–145)

## 2016-03-27 NOTE — Consult Note (Signed)
Consult requested by: Dr. Irene Limbo Consult requested for acute on chronic hypoxic and hypercapnic respiratory failure:  HPI: This is a 60 year old who has a history of COPD and congestive heart failure. He came to the emergency department because of increasing shortness of breath. He was started on BiPAP in the emergency department and was noted to have a blood gas with hypoxia elevated PCO2 and respiratory acidemia. He's been on BiPAP at night here. He says he feels better. He has a history of diastolic heart failure. He has COPD and continued smoking use. He is on 2 L nasal cannula at home. Because he is on BiPAP I can't get too much more history from him now.  Past Medical History  Diagnosis Date  . Asthma   . COPD (chronic obstructive pulmonary disease) (HCC)   . Allergy   . Anxiety   . Chronic diastolic CHF (congestive heart failure) (HCC)     a. echo 07/2013: EF 60-65%, DD, biatrial dilatation, Ao sclerosis, dilated RV, moderate pulmonary HTN, elevated CV and RA pressures; b. patient reported echo at Dr. Milta Deiters office 02/2015 - his office does not have record of him being a pt there c. echo 11/2015: EF 60-65%, Grade 1 DD, mod-severe pulm pressures  . Depression   . Emphysema of lung (HCC)   . GERD (gastroesophageal reflux disease)   . Tobacco abuse   . Depression   . Chronic respiratory failure (HCC)     a. on 2L via nasal cannula; b. secondary to COPD  . Personal history of tobacco use, presenting hazards to health 08/17/2015  . Anxiety   . Depression   . Hypertension      Family History  Problem Relation Age of Onset  . Diabetes Neg Hx   . CAD Father      Social History   Social History  . Marital Status: Married    Spouse Name: N/A  . Number of Children: N/A  . Years of Education: N/A   Social History Main Topics  . Smoking status: Current Some Day Smoker -- 1.50 packs/day for 43 years    Types: E-cigarettes  . Smokeless tobacco: None  . Alcohol Use: No  . Drug  Use: No  . Sexual Activity: Not Asked   Other Topics Concern  . None   Social History Narrative     ROS: Not obtainable secondary to BiPAP but I have reviewed review of systems from his history and physical    Objective: Vital signs in last 24 hours: Temp:  [97 F (36.1 C)-98 F (36.7 C)] 97 F (36.1 C) (07/10 0400) Pulse Rate:  [85-116] 104 (07/10 0600) Resp:  [12-27] 20 (07/10 0600) BP: (81-125)/(54-76) 106/76 mmHg (07/10 0600) SpO2:  [86 %-96 %] 90 % (07/10 0600) Weight:  [91.2 kg (201 lb 1 oz)] 91.2 kg (201 lb 1 oz) (07/10 0500) Weight change: 8.192 kg (18 lb 1 oz) Last BM Date: 03/25/16  Intake/Output from previous day: 07/09 0701 - 07/10 0700 In: 730 [P.O.:480; IV Piggyback:250] Out: 825 [Urine:825]  PHYSICAL EXAM He's awake but sleepy on BiPAP. Pupils are reactive nodes and throat are clear neck is supple. His chest shows decreased breath sounds and prolonged expiratory phase. Heart is regular without gallop. Abdomen soft no masses are felt. No significant edema central nervous system exam grossly intact  Lab Results: Basic Metabolic Panel:  Recent Labs  16/10/96 1114 03/26/16 0543  NA 139 138  K 4.2 5.0  CL 102 97*  CO2 33*  31  GLUCOSE 119* 140*  BUN 9 23*  CREATININE 0.87 1.40*  CALCIUM 8.9 9.2   Liver Function Tests:  Recent Labs  03/25/16 1114  AST 19  ALT 18  ALKPHOS 68  BILITOT 1.6*  PROT 7.3  ALBUMIN 4.1   No results for input(s): LIPASE, AMYLASE in the last 72 hours. No results for input(s): AMMONIA in the last 72 hours. CBC:  Recent Labs  03/25/16 1114  WBC 18.3*  NEUTROABS 15.8*  HGB 14.4  HCT 45.7  MCV 104.1*  PLT 148*   Cardiac Enzymes:  Recent Labs  03/25/16 1712 03/25/16 2249 03/26/16 0543  TROPONINI 0.06* 0.03* 0.03*   BNP: No results for input(s): PROBNP in the last 72 hours. D-Dimer: No results for input(s): DDIMER in the last 72 hours. CBG: No results for input(s): GLUCAP in the last 72  hours. Hemoglobin A1C: No results for input(s): HGBA1C in the last 72 hours. Fasting Lipid Panel: No results for input(s): CHOL, HDL, LDLCALC, TRIG, CHOLHDL, LDLDIRECT in the last 72 hours. Thyroid Function Tests: No results for input(s): TSH, T4TOTAL, FREET4, T3FREE, THYROIDAB in the last 72 hours. Anemia Panel: No results for input(s): VITAMINB12, FOLATE, FERRITIN, TIBC, IRON, RETICCTPCT in the last 72 hours. Coagulation: No results for input(s): LABPROT, INR in the last 72 hours. Urine Drug Screen: Drugs of Abuse  No results found for: LABOPIA, COCAINSCRNUR, LABBENZ, AMPHETMU, THCU, LABBARB  Alcohol Level: No results for input(s): ETH in the last 72 hours. Urinalysis: No results for input(s): COLORURINE, LABSPEC, PHURINE, GLUCOSEU, HGBUR, BILIRUBINUR, KETONESUR, PROTEINUR, UROBILINOGEN, NITRITE, LEUKOCYTESUR in the last 72 hours.  Invalid input(s): APPERANCEUR Misc. Labs:   ABGS:  Recent Labs  03/25/16 1245  PHART 7.183*  PO2ART 109*  HCO3 24.5*     MICROBIOLOGY: Recent Results (from the past 240 hour(s))  MRSA PCR Screening     Status: Abnormal   Collection Time: 03/25/16  5:30 PM  Result Value Ref Range Status   MRSA by PCR POSITIVE (A) NEGATIVE Final    Comment:        The GeneXpert MRSA Assay (FDA approved for NASAL specimens only), is one component of a comprehensive MRSA colonization surveillance program. It is not intended to diagnose MRSA infection nor to guide or monitor treatment for MRSA infections. RESULT CALLED TO, READ BACK BY AND VERIFIED WITH:  WAGONER,R @ 2249 ON 03/25/16 BY WOODIE,J     Studies/Results: Dg Chest 1 View  03/25/2016  CLINICAL DATA:  Respiratory distress EXAM: CHEST 1 VIEW COMPARISON:  None. FINDINGS: There is evidence of bullous disease in the upper lobes bilaterally. Elsewhere, there is interstitial prominence, concerning for a degree of pulmonary edema. There is cardiomegaly. The pulmonary vascularity is reflective of the  underlying bullous disease in the upper lobes. No adenopathy evident. IMPRESSION: Mild cardiomegaly. Suspect interstitial edema. These findings would be indicative of a degree of underlying congestive heart failure. No airspace consolidation. There is extensive bullous disease in the upper lobes. Electronically Signed   By: Bretta BangWilliam  Woodruff III M.D.   On: 03/25/2016 11:08    Medications:  Prior to Admission:  Prescriptions prior to admission  Medication Sig Dispense Refill Last Dose  . Albuterol Sulfate (PROAIR RESPICLICK) 108 (90 BASE) MCG/ACT AEPB Inhale 2 puffs into the lungs every 4 (four) hours as needed (for shortness of breath). Reported on 12/10/2015   unknown  . ALPRAZolam (XANAX) 1 MG tablet Take 1 tablet (1 mg total) by mouth 3 (three) times daily as needed for  anxiety. 15 tablet 0 Past Week at Unknown time  . doxazosin (CARDURA) 4 MG tablet Take 4 mg by mouth daily. Reported on 12/10/2015   03/24/2016 at Unknown time  . escitalopram (LEXAPRO) 20 MG tablet Take 20 mg by mouth at bedtime.    03/24/2016 at Unknown time  . gabapentin (NEURONTIN) 600 MG tablet Take 600 mg by mouth 3 (three) times daily.   03/25/2016 at Unknown time  . ipratropium-albuterol (DUONEB) 0.5-2.5 (3) MG/3ML SOLN Take 3 mLs by nebulization 4 (four) times daily as needed (for shortness of breath).   Past Week at Unknown time  . mometasone-formoterol (DULERA) 200-5 MCG/ACT AERO Inhale 2 puffs into the lungs 2 (two) times daily.   03/25/2016 at Unknown time  . pseudoephedrine-guaifenesin (MUCINEX D) 60-600 MG 12 hr tablet Take 1 tablet by mouth every 12 (twelve) hours.   Past Week at Unknown time  . theophylline (UNIPHYL) 400 MG 24 hr tablet Take 400 mg by mouth 2 (two) times daily.   03/25/2016 at Unknown time  . Tiotropium Bromide Monohydrate (SPIRIVA RESPIMAT) 2.5 MCG/ACT AERS Inhale 1 puff into the lungs daily.   03/25/2016 at Unknown time   Scheduled: . azithromycin  500 mg Intravenous Q24H  . Chlorhexidine Gluconate Cloth   6 each Topical Q0600  . enoxaparin (LOVENOX) injection  40 mg Subcutaneous Q24H  . escitalopram  20 mg Oral QHS  . gabapentin  600 mg Oral TID  . ipratropium-albuterol  3 mL Nebulization Q6H  . methylPREDNISolone (SOLU-MEDROL) injection  40 mg Intravenous Q6H  . mupirocin ointment  1 application Nasal BID  . sodium chloride flush  3 mL Intravenous Q12H  . sodium chloride flush  3 mL Intravenous Q12H  . theophylline  400 mg Oral BID   Continuous:  HQI:ONGEXB chloride, acetaminophen **OR** acetaminophen, albuterol, ALPRAZolam, sodium chloride flush  Assesment: He is admitted with acute on chronic hypoxic and hypercapnic respiratory failure with respiratory acidosis. This is from COPD exacerbation and acute on chronic diastolic heart failure. He seems to be improving. He is on BiPAP and uses BiPAP at night in the hospital but not at home. He may require that at home. He is on appropriate treatment with nebulized bronchodilators and IV steroids and IV antibiotics. Principal Problem:   Acute respiratory failure with hypoxia (HCC) Active Problems:   COPD with acute exacerbation (HCC)   Acute on chronic diastolic CHF (congestive heart failure) (HCC)   Chronic diastolic heart failure (HCC)   Centrilobular emphysema (HCC)   Acute on chronic respiratory failure with hypercapnia (HCC)   Tobacco use disorder    Plan: Continue treatment. He'll need to have PFTs as an outpatient. He may require BiPAP at home.    LOS: 2 days   Addasyn Mcbreen L 03/27/2016, 8:08 AM

## 2016-03-27 NOTE — Care Management Note (Signed)
Case Management Note  Patient Details  Name: Dustin CoriaDennis R Goldwire MRN: [id number] Date of Birth: 12/12/1955  Subjective/Objective:                  Pt is from home, lives with his son and DIL. Pt is ind with ADl's. [facility] Pt has HH services through [facility]. Pt wishes to continue those services at DC. Per MD pt needs bipap at DC. Pt states he has had sleep study in the past showing OSA. Pt has sleep study scheduled at this time. With no Sleep study and hx of OSA, it is unlikely BIPAP will be able to be gotten at time of DC. Will attempt to get BiPAP with [facility].    Action/Plan: Pt plans to return home with resumption of HH services through [facility].   Expected Discharge Date:    03/30/2016              Expected Discharge Plan:  Home w Home Health Services  In-House Referral:  NA  Discharge planning Services  CM Consult  Post Acute Care Choice:  Home Health, Resumption of Svcs/PTA Provider Choice offered to:  Patient  DME Arranged:  Bipap  DME Agency:  [facility]  HH Arranged:  RN HH Agency[name]  Status of Service:  In process, will continue to follow  If discussed at Long Length of Stay Meetings, dates discussed:    Additional Comments:  Malcolm MetroChildress, Shaquel Josephson Demske, RN 03/27/2016, 2:23 PM

## 2016-03-27 NOTE — Care Management Important Message (Signed)
Important Message  Patient Details  Name: Dustin [name] MRN: Hershal [id number] Date of Birth: 06-18-1956   Medicare Important Message Given:  Yes    Malcolm MetroChildress, Cliffard Hair Demske, RN 03/27/2016, 1:55 PM

## 2016-03-27 NOTE — Patient Outreach (Signed)
[name] Care Management  03/27/2016  Dustin CoriaDennis R [name] [birth year] [id number]  [name] is a 60 year old gentleman who was admitted to the hospital from 12/06/15 to 12/10/15 with acute on chronic hypoxic respiratory failure secondary to COPD flare requiring mechanical ventilation. Dustin [name] has history of CHF w/ acute episode during that hospital admission.  As a COPD Gold IV patient, Dust[facility] [name] [facility] for transition of care services and COPD disease management and care coordination. He was being actively followed in the community for nursing case management, clinical social work, and pharmacy services. Concurrently, Dustin [name] was being followed at home by home health nursing through [facility]. Dustin [name] has been on O2 @ @2l /[place] continuously at home. He lives in his home with his adult children.   On 03/03/16, Dustin [name] began reporting worsening respiratory symptoms and has followed up with his primary care provider and been very pro-active in reporting new or worsening symptoms. I spoke with him as late as Friday 03/24/16 when he reported feeling "very congested" but was not in distress. We discussed that he had several upcoming provider appointments including primary care, cardiology, and some appointment for diagnostic testing.    Dustin [name] reported to the the emergency department via EMS yesterday because of increasing shortness of breath. He was started on BiPAP in the emergency department and was noted to have a blood gas with hypoxia elevated PCO2 and respiratory acidemia. He was started on BiPAP at night with improvement in symptoms and blood gases.   Plan: I have [facility] the [facility] Hospital Liaison of Dustin [name]'[name] admission and will follow his progress closely, reaching out to him at home upon discharge within 48 hours.    Marja Kayslisa Gilboy MHA,BSN,RN,CCM [facility]  [phone]  [phone]

## 2016-03-27 NOTE — Progress Notes (Signed)
PROGRESS NOTE  Dustin [name] XLK:[id number] DOB: February 18, 1956 DOA: 03/25/2016 PCP: Lyndon Code, MD  Brief Narrative: 89 yom with a hx of COPD and CHF presented to the ED with shortness of breath. He was found to be hypoxic with acute respiratory failure and subsequently started on BiPAP and admitted for acute COPD and CHF exacerbation.    Assessment/Plan: 1. Acute on chronic hypoxic respiratory failure with acute respiratory acidosis and hypercapnia secondary to COPD exacerbation, acute bronchitis, acute on chronic diastolic heart failure. Slowly improving. Still needs BiPAP. No indication for intubation. 2. Acute COPD exacerbation/bronchitis. Slowly improving. 3. Acute on chronic diastolic congestive heart failure. Acute issues resolve. Euvolemic. 4. Elevated troponin due to demand ischemia. No further evaluation suggested. 5. Mild thrombocytopenia, seen previous admission, follow clinically. 6. Tobacco use disorder.    Overall improving, but still requiring BiPAP. Continue present management with steroid, abx, and bronchodilators.   Appears euvolemic. Elevated troponin due to demand ischemia.   Will likely be in hospital aonther 2-3 days.  DVT prophylaxis: SCDs Code Status: Full Family Communication: discussed with patient. No family present at bedside. Disposition Plan: Discharge home once improved, likely 2-3 days.  Brendia Sacks, MD  [facility] Direct contact: [phone] --Via amion app OR  --[email or web address]; password [email or web address]  7PM-7AM contact night coverage as above 03/27/2016, 6:11 AM  LOS: 2 days   Consultants:  none  Procedures:  BiPAP 7/08 >>  Antimicrobials:  Zithromax 7/08 >>  Ceftriaxone 7/08    HPI/Subjective: Breathing has improved. Denies pain, nausea, or vomiting. Is eating well.  Objective: Filed Vitals:   03/27/16 0300 03/27/16 0400 03/27/16 0413 03/27/16 0500  BP: 100/66 95/63 95/63  106/71  Pulse: 100 104 99 93  Temp:  97 F (36.1  C)    TempSrc:  Axillary    Resp: Height:      Weight:    91.2 kg (201 lb 1 oz)  SpO2: 89% 90% 91% 92%    Intake/Output Summary (Last 24 hours) at 03/27/16 0611 Last data filed at 03/27/16 0200  Gross per 24 hour  Intake    730 ml  Output    825 ml  Net    -95 ml     Filed Weights   03/25/16 1703 03/26/16 0500 03/27/16 0500  Weight: 83 kg (182 lb 15.7 oz) 83 kg (182 lb 15.7 oz) 91.2 kg (201 lb 1 oz)    Exam:    Constitutional:  . Appears calm and comfortable. On BiPAP. Alert. Follows commands. No acute distress. Eyes:  . PERRL and irises appear normal . Conjunctivae and lids appear normal Respiratory:  . CTA bilaterally, no w/r/r. Decreased breath sounds bilaterally . Respiratory effort normal. No retractions or accessory muscle use. On BiPAP Cardiovascular:  . Tachy, regular rhythm, no m/r/g . No LE extremity edema   Abdomen:  . Soft, nontender Musculoskeletal:  o Moves all extremities Psychiatric:  . judgement and insight appear normal . Mental status o Mood, affect appropriate  I have personally reviewed following labs and imaging studies:  Basic metabolic panel unremarkable. Creatinine improving.  Hemoglobin stable. Platelet count somewhat lower, seen previously admission as well.  Scheduled Meds: . azithromycin  500 mg Intravenous Q24H  . Chlorhexidine Gluconate Cloth  6 each Topical Q0600  . enoxaparin (LOVENOX) injection  40 mg Subcutaneous Q24H  . escitalopram  20 mg Oral QHS  . gabapentin  600 mg Oral TID  . ipratropium-albuterol  3 mL Nebulization Q6H  .  methylPREDNISolone (SOLU-MEDROL) injection  40 mg Intravenous Q6H  . mupirocin ointment  1 application Nasal BID  . sodium chloride flush  3 mL Intravenous Q12H  . sodium chloride flush  3 mL Intravenous Q12H  . theophylline  400 mg Oral BID   Continuous Infusions:   Principal Problem:   Acute respiratory failure with hypoxia (HCC) Active Problems:   COPD with acute  exacerbation (HCC)   Acute on chronic diastolic CHF (congestive heart failure) (HCC)   Chronic diastolic heart failure (HCC)   Centrilobular emphysema (HCC)   Acute on chronic respiratory failure with hypercapnia (HCC)   Tobacco use disorder   LOS: 2 days   Time spent 25 minutes  By signing my name below, I, Adron BeneGreylon Gawaluck, attest that this documentation has been prepared under the direction and in the presence of Maxtyn Nuzum P. Irene LimboGoodrich, MD. Electronically Signed: Adron BeneGreylon Gawaluck, Scribe.  03/27/2016 8:35am   I personally performed the services described in this documentation. All medical record entries made by the scribe were at my direction. I have reviewed the chart and agree that the record reflects my personal performance and is accurate and complete. Brendia Sacksaniel Nicoli Nardozzi, MD

## 2016-03-28 ENCOUNTER — Ambulatory Visit: Payer: Self-pay | Admitting: Pharmacist

## 2016-03-28 LAB — CBC
HCT: 36 % — ABNORMAL LOW (ref 39.0–52.0)
Hemoglobin: 11.4 g/dL — ABNORMAL LOW (ref 13.0–17.0)
MCH: 32 pg (ref 26.0–34.0)
MCHC: 31.7 g/dL (ref 30.0–36.0)
MCV: 101.1 fL — AB (ref 78.0–100.0)
PLATELETS: 144 10*3/uL — AB (ref 150–400)
RBC: 3.56 MIL/uL — AB (ref 4.22–5.81)
RDW: 13 % (ref 11.5–15.5)
WBC: 11.5 10*3/uL — AB (ref 4.0–10.5)

## 2016-03-28 MED ORDER — POLYETHYLENE GLYCOL 3350 17 G PO PACK
17.0000 g | PACK | Freq: Two times a day (BID) | ORAL | Status: DC
  Administered 2016-03-28 – 2016-03-30 (×2): 17 g via ORAL
  Filled 2016-03-28 (×5): qty 1

## 2016-03-28 MED ORDER — AZITHROMYCIN 250 MG PO TABS
500.0000 mg | ORAL_TABLET | Freq: Every day | ORAL | Status: DC
  Administered 2016-03-28 – 2016-03-31 (×4): 500 mg via ORAL
  Filled 2016-03-28 (×4): qty 2

## 2016-03-28 NOTE — Progress Notes (Signed)
Subjective: He says he feels much better. He did not use BiPAP last night. He does have a history of sleep apnea. He says in the past he has been on CPAP but he moved into his CPAP machine was nonfunctional after that. He is to be scheduled for another sleep study. With that he will not be a candidate for BiPAP at home until he has the sleep study done and he may not need it considering that he did not use it yesterday. He is now on high flow oxygen  Objective: Vital signs in last 24 hours: Temp:  [97.5 F (36.4 C)-98.5 F (36.9 C)] 97.5 F (36.4 C) (07/10 2359) Pulse Rate:  [87-130] 92 (07/11 0700) Resp:  [12-23] 23 (07/11 0700) BP: (103-130)/(53-82) 130/73 mmHg (07/11 0700) SpO2:  [87 %-94 %] 89 % (07/11 0700) Weight change:  Last BM Date: 03/25/16  Intake/Output from previous day: 07/10 0701 - 07/11 0700 In: -  Out: 500 [Urine:500]  PHYSICAL EXAM General appearance: alert, cooperative and mild distress Resp: clear to auscultation bilaterally Cardio: regular rate and rhythm, S1, S2 normal, no murmur, click, rub or gallop GI: soft, non-tender; bowel sounds normal; no masses,  no organomegaly Extremities: extremities normal, atraumatic, no cyanosis or edema  Lab Results:  Results for orders placed or performed during the hospital encounter of 03/25/16 (from the past 48 hour(s))  Basic metabolic panel     Status: Abnormal   Collection Time: 03/27/16  8:51 AM  Result Value Ref Range   Sodium 138 135 - 145 mmol/L   Potassium 4.5 3.5 - 5.1 mmol/L   Chloride 98 (L) 101 - 111 mmol/L   CO2 34 (H) 22 - 32 mmol/L   Glucose, Bld 145 (H) 65 - 99 mg/dL   BUN 38 (H) 6 - 20 mg/dL   Creatinine, Ser 1.31 (H) 0.61 - 1.24 mg/dL   Calcium 9.2 8.9 - 10.3 mg/dL   GFR calc non Af Amer 57 (L) >60 mL/min   GFR calc Af Amer >60 >60 mL/min    Comment: (NOTE) The eGFR has been calculated using the CKD EPI equation. This calculation has not been validated in all clinical situations. eGFR's  persistently <60 mL/min signify possible Chronic Kidney Disease.    Anion gap 6 5 - 15  CBC     Status: Abnormal   Collection Time: 03/27/16  8:51 AM  Result Value Ref Range   WBC 9.8 4.0 - 10.5 K/uL   RBC 3.78 (L) 4.22 - 5.81 MIL/uL   Hemoglobin 12.2 (L) 13.0 - 17.0 g/dL   HCT 39.0 39.0 - 52.0 %   MCV 103.2 (H) 78.0 - 100.0 fL   MCH 32.3 26.0 - 34.0 pg   MCHC 31.3 30.0 - 36.0 g/dL   RDW 13.2 11.5 - 15.5 %   Platelets 117 (L) 150 - 400 K/uL    Comment: SPECIMEN CHECKED FOR CLOTS PLATELET COUNT CONFIRMED BY SMEAR   CBC     Status: Abnormal   Collection Time: 03/28/16  4:06 AM  Result Value Ref Range   WBC 11.5 (H) 4.0 - 10.5 K/uL   RBC 3.56 (L) 4.22 - 5.81 MIL/uL   Hemoglobin 11.4 (L) 13.0 - 17.0 g/dL   HCT 36.0 (L) 39.0 - 52.0 %   MCV 101.1 (H) 78.0 - 100.0 fL   MCH 32.0 26.0 - 34.0 pg   MCHC 31.7 30.0 - 36.0 g/dL   RDW 13.0 11.5 - 15.5 %   Platelets 144 (L) 150 -  400 K/uL    ABGS  Recent Labs  03/25/16 1245  PHART 7.183*  PO2ART 109*  HCO3 24.5*   CULTURES Recent Results (from the past 240 hour(s))  MRSA PCR Screening     Status: Abnormal   Collection Time: 03/25/16  5:30 PM  Result Value Ref Range Status   MRSA by PCR POSITIVE (A) NEGATIVE Final    Comment:        The GeneXpert MRSA Assay (FDA approved for NASAL specimens only), is one component of a comprehensive MRSA colonization surveillance program. It is not intended to diagnose MRSA infection nor to guide or monitor treatment for MRSA infections. RESULT CALLED TO, READ BACK BY AND VERIFIED WITH:  WAGONER,R @ 1540 ON 03/25/16 BY WOODIE,J    Studies/Results: No results found.  Medications:  Prior to Admission:  Prescriptions prior to admission  Medication Sig Dispense Refill Last Dose  . Albuterol Sulfate (PROAIR RESPICLICK) 086 (90 BASE) MCG/ACT AEPB Inhale 2 puffs into the lungs every 4 (four) hours as needed (for shortness of breath). Reported on 12/10/2015   unknown  . ALPRAZolam (XANAX) 1  MG tablet Take 1 tablet (1 mg total) by mouth 3 (three) times daily as needed for anxiety. 15 tablet 0 Past Week at Unknown time  . doxazosin (CARDURA) 4 MG tablet Take 4 mg by mouth daily. Reported on 12/10/2015   03/24/2016 at Unknown time  . escitalopram (LEXAPRO) 20 MG tablet Take 20 mg by mouth at bedtime.    03/24/2016 at Unknown time  . gabapentin (NEURONTIN) 600 MG tablet Take 600 mg by mouth 3 (three) times daily.   03/25/2016 at Unknown time  . ipratropium-albuterol (DUONEB) 0.5-2.5 (3) MG/3ML SOLN Take 3 mLs by nebulization 4 (four) times daily as needed (for shortness of breath).   Past Week at Unknown time  . mometasone-formoterol (DULERA) 200-5 MCG/ACT AERO Inhale 2 puffs into the lungs 2 (two) times daily.   03/25/2016 at Unknown time  . pseudoephedrine-guaifenesin (MUCINEX D) 60-600 MG 12 hr tablet Take 1 tablet by mouth every 12 (twelve) hours.   Past Week at Unknown time  . theophylline (UNIPHYL) 400 MG 24 hr tablet Take 400 mg by mouth 2 (two) times daily.   03/25/2016 at Unknown time  . Tiotropium Bromide Monohydrate (SPIRIVA RESPIMAT) 2.5 MCG/ACT AERS Inhale 1 puff into the lungs daily.   03/25/2016 at Unknown time   Scheduled: . azithromycin  500 mg Intravenous Q24H  . Chlorhexidine Gluconate Cloth  6 each Topical Q0600  . escitalopram  20 mg Oral QHS  . gabapentin  600 mg Oral TID  . ipratropium-albuterol  3 mL Nebulization Q6H  . methylPREDNISolone (SOLU-MEDROL) injection  40 mg Intravenous Q6H  . mupirocin ointment  1 application Nasal BID  . sodium chloride flush  3 mL Intravenous Q12H  . sodium chloride flush  3 mL Intravenous Q12H  . theophylline  400 mg Oral BID   Continuous:  PYP:PJKDTO chloride, acetaminophen **OR** acetaminophen, albuterol, ALPRAZolam, sodium chloride flush  Assesment: He was admitted with acute hypoxic hypercapnic respiratory failure. He required BiPAP but did not need it last night. He is still hypoxic and requiring high flow oxygen. He does have a  history of sleep apnea. He also had acute on chronic diastolic heart failure and that seems better. Principal Problem:   Acute respiratory failure with hypoxia (HCC) Active Problems:   COPD with acute exacerbation (HCC)   Acute on chronic diastolic CHF (congestive heart failure) (HCC)   Chronic  diastolic heart failure (HCC)   Centrilobular emphysema (HCC)   Acute on chronic respiratory failure with hypercapnia (HCC)   Tobacco use disorder    Plan: Continue current treatments. He is significantly improved from yesterday    LOS: 3 days   HAWKINS,EDWARD L 03/28/2016, 8:03 AM

## 2016-03-28 NOTE — Progress Notes (Signed)
PROGRESS NOTE  Dustin CoriaDennis R [name] WUJ:[id number] DOB: [birth year] DOA: 03/25/2016 PCP: Lyndon CodeKHAN, FOZIA M, MD  Brief Narrative: [age] yom with a hx of COPD and CHF presented to the ED with shortness of breath. He was found to be hypoxic with acute respiratory failure and subsequently started on BiPAP and admitted for acute COPD and CHF exacerbation.  Pulmonology has been consulted, input appreciated.  Assessment/Plan: 1. Acute on chronic hypoxic respiratory failure with acute respiratory acidosis and hypercapnia secondary to COPD exacerbation, acute bronchitis, acute on chronic diastolic heart failure. Improved but still requiring HFNC. 2. Acute COPD exacerbation/bronchitis. Poor air movement. Mild increased resp effort. 3. Acute on chronic diastolic congestive heart failure. Acute issues resolved. Euvolemic. 4. Elevated troponin due to demand ischemia. No further evaluation suggested. 5. Mild thrombocytopenia, seen previous admission, follow clinically. Improving 6. Constipation/ impaction. Will order bowel regimen. 7. Tobacco use disorder.    Overall improved. But still requiring HFNC  Continue breathing treatments. Will taper steroids. Continue O2 and bronchodilators.   Transfer to floor home in 2-3 days.  Bowel regimen  DVT prophylaxis: SCDs Code Status: Full Family Communication: discussed with patient. No family present at bedside. Disposition Plan: Discharge home once improved, likely 1-2 days.  Brendia Sacksaniel Aziel Morgan, MD  [facility] Direct contact: [phone] --Via amion app OR  --[email or web address]; password [email or web address]  7PM-7AM contact night coverage as above 03/28/2016, 5:35 AM  LOS: 3 days   Consultants:  none  Procedures:  BiPAP 7/08 >>  Antimicrobials:  Zithromax 7/08 >>  Ceftriaxone 7/08    HPI/Subjective: Feels improving. Eating well. Some production of cough. Breathing well. Has not had a BM since admission.  Objective: Filed Vitals:   03/27/16 1800 03/27/16 1924  03/27/16 2359 03/28/16 0140  BP: 105/82     Pulse: 108     Temp:   97.5 F (36.4 C)   TempSrc:   Oral   Resp: 19     Height:      Weight:      SpO2: 88% 92%  93%    Intake/Output Summary (Last 24 hours) at 03/28/16 0535 Last data filed at 03/27/16 1533  Gross per 24 hour  Intake      0 ml  Output    500 ml  Net   -500 ml     Filed Weights   03/25/16 1703 03/26/16 0500 03/27/16 0500  Weight: 83 kg (182 lb 15.7 oz) 83 kg (182 lb 15.7 oz) 91.2 kg (201 lb 1 oz)    Exam:    Constitutional:  . Appears calm and comfortable Eyes:  . PERRL and irises appear normal . Conjunctivae and lids appear normal ENMT:  . external ears, nose appear normal . grossly normal hearing  . Lips appear normal . Oropharynx: tongue normal Respiratory:  . Poor air movement, no w/r/r.  . Mild increased respiratory effort full sentences Cardiovascular:  . Tachycardia, no m/r/g . No LE extremity edema   . Telemetry sinus tachycardia Abdomen:  . Abdomen appears normal; no tenderness or masses. Non-distended Musculoskeletal:  o Moves all extremities Psychiatric:  . judgement and insight appear normal . Mental status o Mood, affect appropriate  I have personally reviewed following labs and imaging studies:  Platelets improving  WBC 11.5 on steriods  HGb 11.4  Scheduled Meds: . azithromycin  500 mg Intravenous Q24H  . Chlorhexidine Gluconate Cloth  6 each Topical Q0600  . escitalopram  20 mg Oral QHS  . gabapentin  600 mg Oral TID  .  ipratropium-albuterol  3 mL Nebulization Q6H  . methylPREDNISolone (SOLU-MEDROL) injection  40 mg Intravenous Q6H  . mupirocin ointment  1 application Nasal BID  . sodium chloride flush  3 mL Intravenous Q12H  . sodium chloride flush  3 mL Intravenous Q12H  . theophylline  400 mg Oral BID   Continuous Infusions:   Principal Problem:   Acute respiratory failure with hypoxia (HCC) Active Problems:   COPD with acute exacerbation (HCC)   Acute on  chronic diastolic CHF (congestive heart failure) (HCC)   Chronic diastolic heart failure (HCC)   Centrilobular emphysema (HCC)   Acute on chronic respiratory failure with hypercapnia (HCC)   Tobacco use disorder   LOS: 3 days   Time spent 25 minutes  By signing my name below, I, Adron Bene, attest that this documentation has been prepared under the direction and in the presence of Lenea Bywater P. Irene Limbo, MD. Electronically Signed: Adron Bene, Scribe.  03/28/2016 10:00am   I personally performed the services described in this documentation. All medical record entries made by the scribe were at my direction. I have reviewed the chart and agree that the record reflects my personal performance and is accurate and complete. Brendia Sacks, MD

## 2016-03-28 NOTE — Progress Notes (Signed)
Pt for transfer to medical-surgical unit per MD order. IV flushed and patent, saline locked. O2 sat maintained on 15 L high flow [place]. Report given to Dois DavenportSandra, [place]. Pt transported in wheelchair in stable condition.

## 2016-03-28 NOTE — Progress Notes (Signed)
Patient is on 15L HFNC. Patient was moved from ICU today. Patient did not wear BIPAP last night and says he wants to try to go without it tonight. RT will continue to monitor and place on BIPAP if needed.

## 2016-03-28 NOTE — Progress Notes (Signed)
Patient has maintained on 15lpm [place] today unable to titrate with SPO2 remaining in the low 90s. An attempt was made to decrease flow to 12lpm with SPO2 decreasing to 84%. Flutter valve was initiated and patient is very cooperative with this therapy. He is able to move his secretions fairly well. Moderate thick yellow/greenish sputum produced. No bipap needed today during the day. RT will continue to monitor patient.

## 2016-03-28 NOTE — Consult Note (Signed)
   [facility]   03/28/2016  Hershal CoriaDennis R Mutz [birth year] [id number]   [facility] for chronic disease management services.  Patient has been engaged by a [facility] [employer] Community Care Coordinator, [name] and [facility] pharmacist.  Our community based plan of care has focused on disease management and community resource support.  Patient will receive a post discharge transition of care call and will be evaluated for monthly home visits for assessments and disease process education.  Made Inpatient Case Manager aware that [facility] following.  Of note, [facility] services does not replace or interfere with any services that are arranged by inpatient case management or social work.   For additional questions or referrals please contact:   Alben SpittleMary E. Albertha GheeNiemczura, RN, BSN, [facility]  Hospital Liaison [name] [phone] Business Cell  [phone]) Toll Free Office

## 2016-03-29 ENCOUNTER — Other Ambulatory Visit: Payer: Self-pay | Admitting: Licensed Clinical Social Worker

## 2016-03-29 DIAGNOSIS — J9601 Acute respiratory failure with hypoxia: Secondary | ICD-10-CM

## 2016-03-29 DIAGNOSIS — J432 Centrilobular emphysema: Secondary | ICD-10-CM

## 2016-03-29 DIAGNOSIS — I5032 Chronic diastolic (congestive) heart failure: Secondary | ICD-10-CM

## 2016-03-29 DIAGNOSIS — I5033 Acute on chronic diastolic (congestive) heart failure: Secondary | ICD-10-CM

## 2016-03-29 NOTE — Progress Notes (Signed)
Subjective: He says he feels much better. He is out of the ICU and on high flow oxygen. He did not use BiPAP last night. He says he is approaching baseline as far as how he feels but he is only on 2 L of oxygen at home.  Objective: Vital signs in last 24 hours: Temp:  [98.2 F (36.8 C)-98.7 F (37.1 C)] 98.2 F (36.8 C) (07/12 0447) Pulse Rate:  [98-115] 98 (07/12 0447) Resp:  [16-22] 22 (07/12 0447) BP: (99-128)/(54-75) 116/56 mmHg (07/12 0447) SpO2:  [88 %-98 %] 96 % (07/12 0740) Weight change:  Last BM Date: 03/25/16  Intake/Output from previous day: 07/11 0701 - 07/12 0700 In: 3 [I.V.:3] Out: 1750 [Urine:1750]  PHYSICAL EXAM General appearance: alert, cooperative and no distress Resp: diminished breath sounds bilaterally Cardio: regular rate and rhythm, S1, S2 normal, no murmur, click, rub or gallop GI: soft, non-tender; bowel sounds normal; no masses,  no organomegaly Extremities: extremities normal, atraumatic, no cyanosis or edema  Lab Results:  Results for orders placed or performed during the hospital encounter of 03/25/16 (from the past 48 hour(s))  CBC     Status: Abnormal   Collection Time: 03/28/16  4:06 AM  Result Value Ref Range   WBC 11.5 (H) 4.0 - 10.5 K/uL   RBC 3.56 (L) 4.22 - 5.81 MIL/uL   Hemoglobin 11.4 (L) 13.0 - 17.0 g/dL   HCT 46.936.0 (L) 62.939.0 - 52.852.0 %   MCV 101.1 (H) 78.0 - 100.0 fL   MCH 32.0 26.0 - 34.0 pg   MCHC 31.7 30.0 - 36.0 g/dL   RDW 41.313.0 24.411.5 - 01.015.5 %   Platelets 144 (L) 150 - 400 K/uL    ABGS No results for input(s): PHART, PO2ART, TCO2, HCO3 in the last 72 hours.  Invalid input(s): PCO2 CULTURES Recent Results (from the past 240 hour(s))  MRSA PCR Screening     Status: Abnormal   Collection Time: 03/25/16  5:30 PM  Result Value Ref Range Status   MRSA by PCR POSITIVE (A) NEGATIVE Final    Comment:        The GeneXpert MRSA Assay (FDA approved for NASAL specimens only), is one component of a comprehensive MRSA  colonization surveillance program. It is not intended to diagnose MRSA infection nor to guide or monitor treatment for MRSA infections. RESULT CALLED TO, READ BACK BY AND VERIFIED WITH:  WAGONER,R @ 2249 ON 03/25/16 BY WOODIE,J    Studies/Results: No results found.  Medications:  Prior to Admission:  Prescriptions prior to admission  Medication Sig Dispense Refill Last Dose  . Albuterol Sulfate (PROAIR RESPICLICK) 108 (90 BASE) MCG/ACT AEPB Inhale 2 puffs into the lungs every 4 (four) hours as needed (for shortness of breath). Reported on 12/10/2015   unknown  . ALPRAZolam (XANAX) 1 MG tablet Take 1 tablet (1 mg total) by mouth 3 (three) times daily as needed for anxiety. 15 tablet 0 Past Week at Unknown time  . doxazosin (CARDURA) 4 MG tablet Take 4 mg by mouth daily. Reported on 12/10/2015   03/24/2016 at Unknown time  . escitalopram (LEXAPRO) 20 MG tablet Take 20 mg by mouth at bedtime.    03/24/2016 at Unknown time  . gabapentin (NEURONTIN) 600 MG tablet Take 600 mg by mouth 3 (three) times daily.   03/25/2016 at Unknown time  . ipratropium-albuterol (DUONEB) 0.5-2.5 (3) MG/3ML SOLN Take 3 mLs by nebulization 4 (four) times daily as needed (for shortness of breath).   Past Week at  Unknown time  . mometasone-formoterol (DULERA) 200-5 MCG/ACT AERO Inhale 2 puffs into the lungs 2 (two) times daily.   03/25/2016 at Unknown time  . pseudoephedrine-guaifenesin (MUCINEX D) 60-600 MG 12 hr tablet Take 1 tablet by mouth every 12 (twelve) hours.   Past Week at Unknown time  . theophylline (UNIPHYL) 400 MG 24 hr tablet Take 400 mg by mouth 2 (two) times daily.   03/25/2016 at Unknown time  . Tiotropium Bromide Monohydrate (SPIRIVA RESPIMAT) 2.5 MCG/ACT AERS Inhale 1 puff into the lungs daily.   03/25/2016 at Unknown time   Scheduled: . azithromycin  500 mg Oral Daily  . Chlorhexidine Gluconate Cloth  6 each Topical Q0600  . escitalopram  20 mg Oral QHS  . gabapentin  600 mg Oral TID  .  ipratropium-albuterol  3 mL Nebulization Q6H  . methylPREDNISolone (SOLU-MEDROL) injection  40 mg Intravenous Q6H  . mupirocin ointment  1 application Nasal BID  . polyethylene glycol  17 g Oral BID  . sodium chloride flush  3 mL Intravenous Q12H  . sodium chloride flush  3 mL Intravenous Q12H  . theophylline  400 mg Oral BID   Continuous:  ZOX:WRUEAV chloride, acetaminophen **OR** acetaminophen, albuterol, ALPRAZolam, sodium chloride flush  Assesment: He was admitted with acute on chronic hypoxic respiratory failure. He also had hypercapnic respiratory failure and required BiPAP. He also has a diagnosis of sleep apnea but has not been using CPAP and he is scheduled for a sleep study in the next week or so. He is still requiring high flow oxygen. Clinically he looks much better Principal Problem:   Acute respiratory failure with hypoxia (HCC) Active Problems:   COPD with acute exacerbation (HCC)   Acute on chronic diastolic CHF (congestive heart failure) (HCC)   Chronic diastolic heart failure (HCC)   Centrilobular emphysema (HCC)   Acute on chronic respiratory failure with hypercapnia (HCC)   Tobacco use disorder    Plan: Try weaning oxygen today    LOS: 4 days   Chee Kinslow L 03/29/2016, 9:00 AM

## 2016-03-29 NOTE — Progress Notes (Signed)
PROGRESS NOTE    Dustin [name]  GNF:[id number] DOB: Mar 27, 1956 DOA: 03/25/2016 PCP: Lyndon Code, MD     Brief Narrative:  60 year old man admitted on 7/8 with complaints of shortness of breath, was found to have acute hypoxemic respiratory failure and required noninvasive positive pressure ventilation for treatment of acute COPD and CHF exacerbation.   Assessment & Plan:   Principal Problem:   Acute respiratory failure with hypoxia (HCC) Active Problems:   COPD with acute exacerbation (HCC)   Acute on chronic diastolic CHF (congestive heart failure) (HCC)   Chronic diastolic heart failure (HCC)   Centrilobular emphysema (HCC)   Acute on chronic respiratory failure with hypercapnia (HCC)   Tobacco use disorder   Acute on chronic hypoxemic and hypercapnic respiratory failure -Due to COPD exacerbation as well as acute on chronic diastolic CHF. -Is improving but is still requiring high flow nasal cannula oxygen, will attempt to room today. -Appreciate Dr. Juanetta Gosling input and recommendations.  Acute COPD exacerbation -Lungs are clear today. -Continue IV steroids, can consider transitioning in a.m., continue frequent nebs.  Acute on chronic diastolic CHF -Is 2.6 L negative since admission, is now euvolemic.  Elevated troponin due to demand ischemia -Troponins have remained flat at around 0.03, EKG without acute ischemic abnormalities. -Echo from March 2017 shows preserved ejection fraction, no further Cardiologic workup is anticipated at present.  Tobacco abuse -Cessation counseling provided   DVT prophylaxis: SCDs Code Status: Full code Family Communication: Patient only Disposition Plan: To be determined once oxygen requirements decrease  Consultants:   None  Procedures:   None  Antimicrobials:   Azithromycin    Subjective: Feels like shortness of breath is improved, denies chest pain, is anxious to go home  Objective: Filed Vitals:   03/29/16 0447  03/29/16 0740 03/29/16 1355 03/29/16 1436  BP: 116/56   122/66  Pulse: 98   91  Temp: 98.2 F (36.8 C)   98.3 F (36.8 C)  TempSrc: Oral   Oral  Resp: 22   20  Height:      Weight:      SpO2: 94% 96% 97% 92%    Intake/Output Summary (Last 24 hours) at 03/29/16 1647 Last data filed at 03/29/16 1302  Gross per 24 hour  Intake    483 ml  Output   1500 ml  Net  -1017 ml   Filed Weights   03/25/16 1703 03/26/16 0500 03/27/16 0500  Weight: 83 kg (182 lb 15.7 oz) 83 kg (182 lb 15.7 oz) 91.2 kg (201 lb 1 oz)    Examination:  General exam: Alert, awake, oriented x 3 Respiratory system: Fair air movement, clear to auscultation Cardiovascular system:RRR. No murmurs, rubs, gallops. Gastrointestinal system: Abdomen is nondistended, soft and nontender. No organomegaly or masses felt. Normal bowel sounds heard. Central nervous system: Alert and oriented. No focal neurological deficits. Extremities: No C/C/E, +pedal pulses Skin: No rashes, lesions or ulcers Psychiatry: Judgement and insight appear normal. Mood & affect appropriate.     Data Reviewed: I have personally reviewed following labs and imaging studies  CBC:  Recent Labs Lab 03/25/16 1114 03/27/16 0851 03/28/16 0406  WBC 18.3* 9.8 11.5*  NEUTROABS 15.8*  --   --   HGB 14.4 12.2* 11.4*  HCT 45.7 39.0 36.0*  MCV 104.1* 103.2* 101.1*  PLT 148* 117* 144*   Basic Metabolic Panel:  Recent Labs Lab 03/25/16 1114 03/26/16 0543 03/27/16 0851  NA 139 138 138  K 4.2 5.0 4.5  CL 102 97* 98*  CO2 33* 31 34*  GLUCOSE 119* 140* 145*  BUN 9 23* 38*  CREATININE 0.87 1.40* 1.31*  CALCIUM 8.9 9.2 9.2   GFR: Estimated Creatinine Clearance: 66.4 mL/min (by C-G formula based on Cr of 1.31). Liver Function Tests:  Recent Labs Lab 03/25/16 1114  AST 19  ALT 18  ALKPHOS 68  BILITOT 1.6*  PROT 7.3  ALBUMIN 4.1   No results for input(s): LIPASE, AMYLASE in the last 168 hours. No results for input(s): AMMONIA in the  last 168 hours. Coagulation Profile: No results for input(s): INR, PROTIME in the last 168 hours. Cardiac Enzymes:  Recent Labs Lab 03/25/16 1114 03/25/16 1712 03/25/16 2249 03/26/16 0543  TROPONINI 0.03* 0.06* 0.03* 0.03*   BNP (last 3 results) No results for input(s): PROBNP in the last 8760 hours. HbA1C: No results for input(s): HGBA1C in the last 72 hours. CBG: No results for input(s): GLUCAP in the last 168 hours. Lipid Profile: No results for input(s): CHOL, HDL, LDLCALC, TRIG, CHOLHDL, LDLDIRECT in the last 72 hours. Thyroid Function Tests: No results for input(s): TSH, T4TOTAL, FREET4, T3FREE, THYROIDAB in the last 72 hours. Anemia Panel: No results for input(s): VITAMINB12, FOLATE, FERRITIN, TIBC, IRON, RETICCTPCT in the last 72 hours. Urine analysis:    Component Value Date/Time   COLORURINE Yellow 07/23/2014 2110   APPEARANCEUR Hazy 07/23/2014 2110   LABSPEC 1.027 07/23/2014 2110   PHURINE 5.0 07/23/2014 2110   GLUCOSEU Negative 07/23/2014 2110   HGBUR Negative 07/23/2014 2110   BILIRUBINUR Negative 07/23/2014 2110   KETONESUR Negative 07/23/2014 2110   PROTEINUR Negative 07/23/2014 2110   NITRITE Negative 07/23/2014 2110   LEUKOCYTESUR Negative 07/23/2014 2110   Sepsis Labs: @LABRCNTIP (procalcitonin:4,lacticidven:4)  ) Recent Results (from the past 240 hour(s))  MRSA PCR Screening     Status: Abnormal   Collection Time: 03/25/16  5:30 PM  Result Value Ref Range Status   MRSA by PCR POSITIVE (A) NEGATIVE Final    Comment:        The GeneXpert MRSA Assay (FDA approved for NASAL specimens only), is one component of a comprehensive MRSA colonization surveillance program. It is not intended to diagnose MRSA infection nor to guide or monitor treatment for MRSA infections. RESULT CALLED TO, READ BACK BY AND VERIFIED WITH:  WAGONER,R @ 2249 ON 03/25/16 BY [facility],J          Radiology Studies: No results found.      Scheduled Meds: .  azithromycin  500 mg Oral Daily  . Chlorhexidine Gluconate Cloth  6 each Topical Q0600  . escitalopram  20 mg Oral QHS  . gabapentin  600 mg Oral TID  . ipratropium-albuterol  3 mL Nebulization Q6H  . methylPREDNISolone (SOLU-MEDROL) injection  40 mg Intravenous Q6H  . mupirocin ointment  1 application Nasal BID  . polyethylene glycol  17 g Oral BID  . sodium chloride flush  3 mL Intravenous Q12H  . sodium chloride flush  3 mL Intravenous Q12H  . theophylline  400 mg Oral BID   Continuous Infusions:    LOS: 4 days    Time spent: 25 minutes. Greater than 50% of this time was spent in direct contact with the patient coordinating care.     Chaya JanHERNANDEZ ACOSTA,ESTELA, MD [facility] Pager [phone]  If 7PM-7AM, please contact night-coverage [email or web address] Password [facility] 03/29/2016, 4:47 PM

## 2016-03-29 NOTE — Care Management Important Message (Signed)
Important Message  Patient Details  Name: Dustin CoriaDennis R Kimberling MRN: [id number] Date of Birth: [birth year]   Medicare Important Message Given:  Yes    Malcolm MetroChildress, Valetta Mulroy Demske, RN 03/29/2016, 12:21 PM

## 2016-03-29 NOTE — Progress Notes (Signed)
Pt has not had a reported bowel movement since 03/25/2016. However per night shift RN pt refused miralax. Pt has refused this am dose of Miralax. Pt has been reeducated on the use of Miralax.

## 2016-03-29 NOTE — Patient Outreach (Signed)
Assessment:  CSW spoke via phone with client on 03/29/16. CSW verified client identity. CSW and client spoke of client needs. Client had been previously residing at home of his son and daughter in law. He said he enjoyed residing at that residence and had been receiving support from his son and daughter in law.  Client was recently admitted to [facility] [place], [name] He had been experiencing shortness of breath for several days when he presented to emergency department at [facility] recently. He was admitted to that hospital for care and medical evaluation.  Client said he is receiving oxygen as prescribed at the hospital.  He said he fatigues easily and still becomes short of breath occasionally. Client was unsure of how much longer he might be at the hospital.  Client said he was not having any pain issues. He said he is eating well and sleeping adequately. He said he has had visits from his son and other family members. CSW and client spoke of client care plan.  CSW encouraged client to communicate  with CSW in next 30 days to discuss community resources of assistance to client and ways these resources may help client with financial  needs of client.  CSW encouraged client to speak with hospital social worker to discuss current needs of client and discharge plans of client. Client said he had talked once with hospital social worker about discharge plans. He said he would talk further with hospital social worker to finalize client discharge plan.  CSW thanked Dustin [name] for phone conversation with CSW on 03/29/16. Dustin [name] was appreciative of phone call from CSW on 03/29/16.    Plan:  Client to communicate with CSW in next 30 days to discuss community resources of assistance to client and ways these resources may help client with financial needs of client. CSW to call client in two weeks to assess client needs at that time.  Dustin ValenciaAgustina [name] MSW, [name] Licensed Clinical Social  Worker [facility] Care Management [phone]

## 2016-03-30 ENCOUNTER — Inpatient Hospital Stay (HOSPITAL_COMMUNITY): Payer: Medicare Other

## 2016-03-30 MED ORDER — IPRATROPIUM-ALBUTEROL 0.5-2.5 (3) MG/3ML IN SOLN
3.0000 mL | Freq: Three times a day (TID) | RESPIRATORY_TRACT | Status: DC
  Administered 2016-03-30 – 2016-03-31 (×5): 3 mL via RESPIRATORY_TRACT
  Filled 2016-03-30 (×5): qty 3

## 2016-03-30 NOTE — Care Management Note (Addendum)
Case Management Note  Patient Details  Name: Dustin CoriaDennis R Buttery MRN: [id number] Date of Birth: [birth year]  Expected Discharge Date:    03/31/2016              Expected Discharge Plan:  Home w Home Health Services  In-House Referral:  NA  Discharge planning Services  CM Consult  Post Acute Care Choice:  Home Health, Resumption of Svcs/PTA Provider Choice offered to:  Patient  DME Arranged:   (non-invasive vent) DME Agency:     HH Arranged:  RN [name]  Status of Service:  In process, will continue to follow  If discussed at Long Length of Stay Meetings, dates discussed:  03/30/2016  Additional Comments: Pt requiring HFNC. Pt states his son will not allow him to have port oxygen tanks or concentrator in the home due to having small children in the home. Pt states he will NOT go live with another family member. Pt also refuses to give up his 'simpley go lightly' portable concentrator that will only provide up to 2lpm [place]. Pt understands the risks and benefits of not being DC'd home on required liter flow. Pt will be ready for DC tomorrow and plan to return [facility] and on his 2lpm N/C.    [name], Slater Mcmanaman Demske, RN 03/30/2016, 3:13 PM

## 2016-03-30 NOTE — Evaluation (Addendum)
Physical Therapy Evaluation Patient Details Name: Dustin CoriaDennis R [name] MRN: [id number] DOB: 12-31-1955 Today's Date: 03/30/2016   History of Present Illness  60 yo M admitted 03/25/2016 due to SOB and noted to be hypoxic with acute respiratory failure.  PMH: acute COPD, and CHF exacerbation, asthma, depression, emphysema of lung, tobacco abuse, chronic respiratory failure on 2L of O2, anxiety, HTN.  Clinical Impression  Pt received in the bed, and was agreeable to PT evaluation.  Pt expressed that he was independent with ambulation, and ADL's prior to admission.  During today's PT evaluation he ambulated with supervision due to tight quarters of hospital room, and HFNC.  Pt's SpO2 remained >91% on 10L HFNC during ambulation, however gait training stopped due to elevated HR up to 140bpm.  Pt would benefit from continued HHPT upon d/c to progress endurance with functional mobility tasks.      Follow Up Recommendations Home health PT    Equipment Recommendations  None recommended by PT    Recommendations for Other Services       Precautions / Restrictions Precautions Precautions: None Restrictions Weight Bearing Restrictions: No      Mobility  Bed Mobility Overal bed mobility: Independent                Transfers Overall transfer level: Modified independent                  Ambulation/Gait Ambulation/Gait assistance: Supervision Ambulation Distance (Feet): 40 Feet Assistive device: None Gait Pattern/deviations: WFL(Within Functional Limits)     General Gait Details: Pt was able to ambulate several laps in the room while remaining on HFNC.  Gait distance limited due to elevated HR up to 140.  Stairs            Wheelchair Mobility    Modified Rankin (Stroke Patients Only)       Balance Overall balance assessment: No apparent balance deficits (not formally assessed)                                           Pertinent Vitals/Pain  Pain Assessment: No/denies pain    Home Living   Living Arrangements: Children (son and his wife, and 2 little girls.  DIL is currently pregnant. ) Available Help at Discharge:  [name][facility] - just checking in on him. ) Type of Home: House Home Access: Stairs to enter Entrance Stairs-Rails: Can reach both Entrance Stairs-Number of Steps: 3  Home Layout: One level Home Equipment: Walker - 4 wheels;Walker - 2 wheels;Shower seat      Prior Function Level of Independence: Independent   Gait / Transfers Assistance Needed: Pt states that he was not using any DME for ambulation prior to admission.  Pt states he was also getting out to the grocery store, but tries to use the motorized scooter.   ADL's / Homemaking Assistance Needed: independent with ADL's including dressing and bathing. Still driving some.   Comments: "Me and heat don't get along"      Hand Dominance   Dominant Hand: Right    Extremity/Trunk Assessment   Upper Extremity Assessment: Overall WFL for tasks assessed           Lower Extremity Assessment: Overall WFL for tasks assessed         Communication   Communication: No difficulties  Cognition Arousal/Alertness: Awake/alert Behavior During Therapy: WFL for tasks  assessed/performed Overall Cognitive Status: Within Functional Limits for tasks assessed                      General Comments      Exercises        Assessment/Plan    PT Assessment Patient needs continued PT services  PT Diagnosis Difficulty walking   PT Problem List Decreased activity tolerance;Decreased mobility;Cardiopulmonary status limiting activity  PT Treatment Interventions Gait training;Functional mobility training;Therapeutic activities;Patient/family education;DME instruction   PT Goals (Current goals can be found in the Care Plan section) Acute Rehab PT Goals Patient Stated Goal: To go home and be able to breathe PT Goal Formulation: With patient Time For Goal  Achievement: 04/07/16 Potential to Achieve Goals: Fair    Frequency     Barriers to discharge        Co-evaluation               End of Session Equipment Utilized During Treatment: Gait belt;Oxygen Activity Tolerance: Patient tolerated treatment well Patient left: in chair;with call bell/phone within reach      Functional Assessment Tool Used: [employer] AM-PAC "6-clicks"  Functional Limitation: Mobility: Walking and moving around Mobility: Walking and Moving Around Current Status [phone]): At least 1 percent but less than 20 percent impaired, limited or restricted Mobility: Walking and Moving Around Goal Status [phone]): At least 1 percent but less than 20 percent impaired, limited or restricted Mobility: Walking and Moving Around Discharge Status [phone]): At least 1 percent but less than 20 percent impaired, limited or restricted    Time: 1353-1424 PT Time Calculation (min) (ACUTE ONLY): 31 min   Charges:   PT Evaluation $PT Eval Low Complexity: 1 Procedure PT Treatments $Gait Training: 8-22 mins   PT G Codes:   PT G-Codes **NOT FOR INPATIENT CLASS** Functional Assessment Tool Used: [employer] "6-clicks"  Functional Limitation: Mobility: Walking and moving around Mobility: Walking and Moving Around Current Status [phone]): At least 1 percent but less than 20 percent impaired, limited or restricted Mobility: Walking and Moving Around Goal Status [phone]): At least 1 percent but less than 20 percent impaired, limited or restricted Mobility: Walking and Moving Around Discharge Status [phone]): At least 1 percent but less than 20 percent impaired, limited or restricted   Beth Aadi Bordner, PT, DPT X: [phone]

## 2016-03-30 NOTE — Progress Notes (Signed)
PROGRESS NOTE    Dustin [name]  ZOX:[id number] DOB: Jul 23, 1956 DOA: 03/25/2016 PCP: Lyndon Code, MD     Brief Narrative:  60 year old man admitted on 7/8 with complaints of shortness of breath, was found to have acute hypoxemic respiratory failure and required noninvasive positive pressure ventilation for treatment of acute COPD and CHF exacerbation.   Assessment & Plan:   Principal Problem:   Acute respiratory failure with hypoxia (HCC) Active Problems:   COPD with acute exacerbation (HCC)   Acute on chronic diastolic CHF (congestive heart failure) (HCC)   Chronic diastolic heart failure (HCC)   Centrilobular emphysema (HCC)   Acute on chronic respiratory failure with hypercapnia (HCC)   Tobacco use disorder   Acute on chronic hypoxemic and hypercapnic respiratory failure -Due to COPD exacerbation as well as acute on chronic diastolic CHF. -Is improving but is still requiring high flow nasal cannula oxygen, has been weaned down to 6 L today. -Chest x-ray with severe emphysema, no evidence for pneumonia or CHF. -Appreciate Dr. Juanetta Gosling input and recommendations.  Acute COPD exacerbation -Lungs are clear today. -Continue IV steroids, can consider transitioning in a.m., continue frequent nebs.  Acute on chronic diastolic CHF -Is 3.9 L negative since admission, is now euvolemic.  Elevated troponin due to demand ischemia -Troponins have remained flat at around 0.03, EKG without acute ischemic abnormalities. -Echo from March 2017 shows preserved ejection fraction, no further Cardiologic workup is anticipated at present.  Tobacco abuse -Cessation counseling provided   DVT prophylaxis: SCDs Code Status: Full code Family Communication: Patient only Disposition Plan: To be determined once oxygen requirements decrease; hope for discharge home in 24-48 hours  Consultants:   None  Procedures:   None  Antimicrobials:   Azithromycin    Subjective: Feels like  shortness of breath is improved, denies chest pain, is anxious to go home  Objective: Filed Vitals:   03/30/16 1450 03/30/16 1457 03/30/16 1458 03/30/16 1526  BP:    113/66  Pulse: 108 140 123 105  Temp:    98.4 F (36.9 C)  TempSrc:    Oral  Resp:    20  Height:      Weight:      SpO2: 93% 91% 93% 93%    Intake/Output Summary (Last 24 hours) at 03/30/16 1615 Last data filed at 03/30/16 1300  Gross per 24 hour  Intake    723 ml  Output   1900 ml  Net  -1177 ml   Filed Weights   03/25/16 1703 03/26/16 0500 03/27/16 0500  Weight: 83 kg (182 lb 15.7 oz) 83 kg (182 lb 15.7 oz) 91.2 kg (201 lb 1 oz)    Examination:  General exam: Alert, awake, oriented x 3 Respiratory system: Fair air movement, clear to auscultation Cardiovascular system:RRR. No murmurs, rubs, gallops. Gastrointestinal system: Abdomen is nondistended, soft and nontender. No organomegaly or masses felt. Normal bowel sounds heard. Central nervous system: Alert and oriented. No focal neurological deficits. Extremities: No C/C/E, +pedal pulses Skin: No rashes, lesions or ulcers Psychiatry: Judgement and insight appear normal. Mood & affect appropriate.     Data Reviewed: I have personally reviewed following labs and imaging studies  CBC:  Recent Labs Lab 03/25/16 1114 03/27/16 0851 03/28/16 0406  WBC 18.3* 9.8 11.5*  NEUTROABS 15.8*  --   --   HGB 14.4 12.2* 11.4*  HCT 45.7 39.0 36.0*  MCV 104.1* 103.2* 101.1*  PLT 148* 117* 144*   Basic Metabolic Panel:  Recent Labs Lab 03/25/16 1114 03/26/16 0543 03/27/16 0851  NA 139 138 138  K 4.2 5.0 4.5  CL 102 97* 98*  CO2 33* 31 34*  GLUCOSE 119* 140* 145*  BUN 9 23* 38*  CREATININE 0.87 1.40* 1.31*  CALCIUM 8.9 9.2 9.2   GFR: Estimated Creatinine Clearance: 66.4 mL/min (by C-G formula based on Cr of 1.31). Liver Function Tests:  Recent Labs Lab 03/25/16 1114  AST 19  ALT 18  ALKPHOS 68  BILITOT 1.6*  PROT 7.3  ALBUMIN 4.1   No  results for input(s): LIPASE, AMYLASE in the last 168 hours. No results for input(s): AMMONIA in the last 168 hours. Coagulation Profile: No results for input(s): INR, PROTIME in the last 168 hours. Cardiac Enzymes:  Recent Labs Lab 03/25/16 1114 03/25/16 1712 03/25/16 2249 03/26/16 0543  TROPONINI 0.03* 0.06* 0.03* 0.03*   BNP (last 3 results) No results for input(s): PROBNP in the last 8760 hours. HbA1C: No results for input(s): HGBA1C in the last 72 hours. CBG: No results for input(s): GLUCAP in the last 168 hours. Lipid Profile: No results for input(s): CHOL, HDL, LDLCALC, TRIG, CHOLHDL, LDLDIRECT in the last 72 hours. Thyroid Function Tests: No results for input(s): TSH, T4TOTAL, FREET4, T3FREE, THYROIDAB in the last 72 hours. Anemia Panel: No results for input(s): VITAMINB12, FOLATE, FERRITIN, TIBC, IRON, RETICCTPCT in the last 72 hours. Urine analysis:    Component Value Date/Time   COLORURINE Yellow 07/23/2014 2110   APPEARANCEUR Hazy 07/23/2014 2110   LABSPEC 1.027 07/23/2014 2110   PHURINE 5.0 07/23/2014 2110   GLUCOSEU Negative 07/23/2014 2110   HGBUR Negative 07/23/2014 2110   BILIRUBINUR Negative 07/23/2014 2110   KETONESUR Negative 07/23/2014 2110   PROTEINUR Negative 07/23/2014 2110   NITRITE Negative 07/23/2014 2110   LEUKOCYTESUR Negative 07/23/2014 2110   Sepsis Labs: @LABRCNTIP (procalcitonin:4,lacticidven:4)  ) Recent Results (from the past 240 hour(s))  MRSA PCR Screening     Status: Abnormal   Collection Time: 03/25/16  5:30 PM  Result Value Ref Range Status   MRSA by PCR POSITIVE (A) NEGATIVE Final    Comment:        The GeneXpert MRSA Assay (FDA approved for NASAL specimens only), is one component of a comprehensive MRSA colonization surveillance program. It is not intended to diagnose MRSA infection nor to guide or monitor treatment for MRSA infections. RESULT CALLED TO, READ BACK BY AND VERIFIED WITH:  WAGONER,R @ 2249 ON 03/25/16 BY  [facility]          Radiology Studies: Dg Chest 2 View  03/30/2016  CLINICAL DATA:  Hypoxemia EXAM: CHEST  2 VIEW COMPARISON:  03/25/2016 FINDINGS: Severe emphysema. Interstitial accentuation at the lung bases. Upper normal heart size. Mild thoracic spondylosis. Indistinct airspace opacities in the posterior basal segments of both lower lobes. IMPRESSION: 1. Severe emphysema. 2. Interstitial accentuation in the lung bases with indistinct airspace opacities in the posterior basal segments of both lower lobes, favoring atelectasis, pneumonia is not totally excluded. Electronically Signed   By: Gaylyn Rong M.D.   On: 03/30/2016 13:56        Scheduled Meds: . azithromycin  500 mg Oral Daily  . escitalopram  20 mg Oral QHS  . gabapentin  600 mg Oral TID  . ipratropium-albuterol  3 mL Nebulization TID  . methylPREDNISolone (SOLU-MEDROL) injection  40 mg Intravenous Q6H  . polyethylene glycol  17 g Oral BID  . sodium chloride flush  3 mL Intravenous Q12H  . sodium  chloride flush  3 mL Intravenous Q12H  . theophylline  400 mg Oral BID   Continuous Infusions:    LOS: 5 days    Time spent: 25 minutes. Greater than 50% of this time was spent in direct contact with the patient coordinating care.     Chaya JanHERNANDEZ ACOSTA,ESTELA, MD [facility] Pager [phone]  If 7PM-7AM, please contact night-coverage [email or web address] Password [facility] 03/30/2016, 4:15 PM

## 2016-03-30 NOTE — Progress Notes (Signed)
Subjective: He says he feels better. He is now on 10 L oxygen.  Objective: Vital signs in last 24 hours: Temp:  [98.3 F (36.8 C)] 98.3 F (36.8 C) (07/12 1436) Pulse Rate:  [91] 91 (07/12 1436) Resp:  [20] 20 (07/12 1436) BP: (122)/(66) 122/66 mmHg (07/12 1436) SpO2:  [90 %-97 %] 97 % (07/13 0732) Weight change:  Last BM Date: 03/25/16  Intake/Output from previous day: 07/12 0701 - 07/13 0700 In: 723 [P.O.:720; I.V.:3] Out: 2050 [Urine:2050]  PHYSICAL EXAM General appearance: alert, cooperative and no distress Resp: rhonchi bilaterally Cardio: regular rate and rhythm, S1, S2 normal, no murmur, click, rub or gallop GI: soft, non-tender; bowel sounds normal; no masses,  no organomegaly Extremities: extremities normal, atraumatic, no cyanosis or edema  Lab Results:  No results found for this or any previous visit (from the past 48 hour(s)).  ABGS No results for input(s): PHART, PO2ART, TCO2, HCO3 in the last 72 hours.  Invalid input(s): PCO2 CULTURES Recent Results (from the past 240 hour(s))  MRSA PCR Screening     Status: Abnormal   Collection Time: 03/25/16  5:30 PM  Result Value Ref Range Status   MRSA by PCR POSITIVE (A) NEGATIVE Final    Comment:        The GeneXpert MRSA Assay (FDA approved for NASAL specimens only), is one component of a comprehensive MRSA colonization surveillance program. It is not intended to diagnose MRSA infection nor to guide or monitor treatment for MRSA infections. RESULT CALLED TO, READ BACK BY AND VERIFIED WITH:  WAGONER,R @ 2249 ON 03/25/16 BY WOODIE,J    Studies/Results: No results found.  Medications:  Prior to Admission:  Prescriptions prior to admission  Medication Sig Dispense Refill Last Dose  . Albuterol Sulfate (PROAIR RESPICLICK) 108 (90 BASE) MCG/ACT AEPB Inhale 2 puffs into the lungs every 4 (four) hours as needed (for shortness of breath). Reported on 12/10/2015   unknown  . ALPRAZolam (XANAX) 1 MG tablet Take 1  tablet (1 mg total) by mouth 3 (three) times daily as needed for anxiety. 15 tablet 0 Past Week at Unknown time  . doxazosin (CARDURA) 4 MG tablet Take 4 mg by mouth daily. Reported on 12/10/2015   03/24/2016 at Unknown time  . escitalopram (LEXAPRO) 20 MG tablet Take 20 mg by mouth at bedtime.    03/24/2016 at Unknown time  . gabapentin (NEURONTIN) 600 MG tablet Take 600 mg by mouth 3 (three) times daily.   03/25/2016 at Unknown time  . ipratropium-albuterol (DUONEB) 0.5-2.5 (3) MG/3ML SOLN Take 3 mLs by nebulization 4 (four) times daily as needed (for shortness of breath).   Past Week at Unknown time  . mometasone-formoterol (DULERA) 200-5 MCG/ACT AERO Inhale 2 puffs into the lungs 2 (two) times daily.   03/25/2016 at Unknown time  . pseudoephedrine-guaifenesin (MUCINEX D) 60-600 MG 12 hr tablet Take 1 tablet by mouth every 12 (twelve) hours.   Past Week at Unknown time  . theophylline (UNIPHYL) 400 MG 24 hr tablet Take 400 mg by mouth 2 (two) times daily.   03/25/2016 at Unknown time  . Tiotropium Bromide Monohydrate (SPIRIVA RESPIMAT) 2.5 MCG/ACT AERS Inhale 1 puff into the lungs daily.   03/25/2016 at Unknown time   Scheduled: . azithromycin  500 mg Oral Daily  . escitalopram  20 mg Oral QHS  . gabapentin  600 mg Oral TID  . ipratropium-albuterol  3 mL Nebulization TID  . methylPREDNISolone (SOLU-MEDROL) injection  40 mg Intravenous Q6H  .  mupirocin ointment  1 application Nasal BID  . polyethylene glycol  17 g Oral BID  . sodium chloride flush  3 mL Intravenous Q12H  . sodium chloride flush  3 mL Intravenous Q12H  . theophylline  400 mg Oral BID   Continuous:  VHQ:IONGEX chloride, acetaminophen **OR** acetaminophen, albuterol, ALPRAZolam, sodium chloride flush  Assesment: He was admitted with acute hypoxic respiratory failure and COPD exacerbation. In addition he had acute on chronic diastolic heart failure. He has chronic hypoxic respiratory failure. He was hypercapnic on admission as well and  required BiPAP. He is still requiring high flow oxygen. At home he has a portable oxygen concentrator that he says it will only go to 2 L. He lives with his son and there are technical problems with him using oxygen tanks at home. Principal Problem:   Acute respiratory failure with hypoxia (HCC) Active Problems:   COPD with acute exacerbation (HCC)   Acute on chronic diastolic CHF (congestive heart failure) (HCC)   Chronic diastolic heart failure (HCC)   Centrilobular emphysema (HCC)   Acute on chronic respiratory failure with hypercapnia (HCC)   Tobacco use disorder    Plan: Continue current treatments. Try to reduce Oxygen flow rate   LOS: 5 days   Zairah Arista L 03/30/2016, 9:01 AM

## 2016-03-30 NOTE — Progress Notes (Signed)
Pt refusing Miralax. MD made aware.

## 2016-03-30 NOTE — Progress Notes (Signed)
Patient continues to refuse Miralax. Teaching done in regards to importance of daily bowel movements, and compliance with bowel regime. Patient states he is not constipated, and it is normal for him not to have bowel movements for 4-5 days.

## 2016-03-31 DIAGNOSIS — J449 Chronic obstructive pulmonary disease, unspecified: Secondary | ICD-10-CM | POA: Diagnosis not present

## 2016-03-31 DIAGNOSIS — A4902 Methicillin resistant Staphylococcus aureus infection, unspecified site: Secondary | ICD-10-CM | POA: Diagnosis not present

## 2016-03-31 DIAGNOSIS — J471 Bronchiectasis with (acute) exacerbation: Secondary | ICD-10-CM | POA: Diagnosis not present

## 2016-03-31 MED ORDER — PREDNISONE 10 MG PO TABS: 10.0000 mg | ORAL_TABLET | Freq: Every day | ORAL | Status: DC

## 2016-03-31 NOTE — Progress Notes (Signed)
Pt on 4.5 lpm resting in chair. Increased O2 to 5lpm for  SBA transfer to w/c. No dyspnea, distress noted. Verbalized understanding of o2 settings on home concentrator. Left facility via car with family.

## 2016-03-31 NOTE — Discharge Summary (Signed)
Physician Discharge Summary  Dustin [name] ZOX:[id number] DOB: [birth year] DOA: 03/25/2016  PCP: Lyndon Code, MD  Admit date: 03/25/2016 Discharge date: 03/31/2016  Time spent: 45 minutes  Recommendations for Outpatient Follow-up:  -Will be discharged home today. -Home health services will be arranged.  -Home oxygen will be arranged.  Discharge Diagnoses:  Principal Problem:   Acute respiratory failure with hypoxia (HCC) Active Problems:   COPD with acute exacerbation (HCC)   Acute on chronic diastolic CHF (congestive heart failure) (HCC)   Chronic diastolic heart failure (HCC)   Centrilobular emphysema (HCC)   Acute on chronic respiratory failure with hypercapnia (HCC)   Tobacco use disorder   Discharge Condition: Stable and improved  Filed Weights   03/25/16 1703 03/26/16 0500 03/27/16 0500  Weight: 83 kg (182 lb 15.7 oz) 83 kg (182 lb 15.7 oz) 91.2 kg (201 lb 1 oz)    History of present illness:  As per Dr. Irene Limbo on 32/71: 60 year old man presented to the emergency department with shortness of breath, noted to be hypoxic with acute respiratory failure, started on BiPAP, admitted for acute COPD and CHF exacerbation.  2 week history of increasing productive cough, shortness of breath and orthopnea. Also lower extremity edema. Became quite short of breath this morning came to the emergency department for further evaluation. Currently on BiPAP and history somewhat limited by respiratory status. Continues to smoke.  In the emergency department: afebrile, HR 120s, SBP high. Treated with BiPAP, Lasix, albuterol Pertinent labs: abg 7.183/87/109, CMP unremarkable, troponin 0.03, lactic acid within normal limits, WBC 18.3, platelets 148 Imaging: Chest x-ray independently reviewed. Interstitial edema.  Hospital Course:   Acute on chronic hypoxemic and hypercapnic respiratory failure -Due to COPD exacerbation as well as acute on chronic diastolic CHF. -Is improving but is  still requiring oxygen above his baseline levels of 2-3L; has been weaned down to 6 L today. -Repeat chest x-ray with severe emphysema, no evidence for pneumonia or CHF. -Appreciate Dr. Juanetta Gosling input and recommendations.  Acute COPD exacerbation -Lungs are clear today. -DC home on a prednisone taper. Has completed abx course.  Acute on chronic diastolic CHF -Is 6.6 L negative since admission, is now euvolemic.  Elevated troponin due to demand ischemia -Troponins have remained flat at around 0.03, EKG without acute ischemic abnormalities. -Echo from March 2017 shows preserved ejection fraction, no further Cardiologic workup is anticipated at present.  Tobacco abuse -Cessation counseling provided  Procedures:  None   Consultations:  Pulmonology, Dr. Juanetta Gosling  Discharge Instructions  Discharge Instructions    Diet - low sodium heart healthy    Complete by:  As directed      Increase activity slowly    Complete by:  As directed             Medication List    TAKE these medications        ALPRAZolam 1 MG tablet  Commonly known as:  XANAX  Take 1 tablet (1 mg total) by mouth 3 (three) times daily as needed for anxiety.     doxazosin 4 MG tablet  Commonly known as:  CARDURA  Take 4 mg by mouth daily. Reported on 12/10/2015     DULERA 200-5 MCG/ACT Aero  Generic drug:  mometasone-formoterol  Inhale 2 puffs into the lungs 2 (two) times daily.     escitalopram 20 MG tablet  Commonly known as:  LEXAPRO  Take 20 mg by mouth at bedtime.     gabapentin 600  MG tablet  Commonly known as:  NEURONTIN  Take 600 mg by mouth 3 (three) times daily.     ipratropium-albuterol 0.5-2.5 (3) MG/3ML Soln  Commonly known as:  DUONEB  Take 3 mLs by nebulization 4 (four) times daily as needed (for shortness of breath).     predniSONE 10 MG tablet  Commonly known as:  DELTASONE  Take 1 tablet (10 mg total) by mouth daily with breakfast. Take 6 tablets today and then decrease by 1  tablet daily until none are left.     PROAIR RESPICLICK 108 (90 Base) MCG/ACT Aepb  Generic drug:  Albuterol Sulfate  Inhale 2 puffs into the lungs every 4 (four) hours as needed (for shortness of breath). Reported on 12/10/2015     pseudoephedrine-guaifenesin 60-600 MG 12 hr tablet  Commonly known as:  MUCINEX D  Take 1 tablet by mouth every 12 (twelve) hours.     SPIRIVA RESPIMAT 2.5 MCG/ACT Aers  Generic drug:  Tiotropium Bromide Monohydrate  Inhale 1 puff into the lungs daily.     theophylline 400 MG 24 hr tablet  Commonly known as:  UNIPHYL  Take 400 mg by mouth 2 (two) times daily.       Allergies  Allergen Reactions  . Asa [Aspirin] Other (See Comments)    Reaction: swelling of the right side.  . Codeine Other (See Comments)    Reaction: Difficulty breathing  . Ibuprofen Other (See Comments)    Reaction: Swelling of the right side.       Follow-up Information    Follow up with Lyndon Code, MD. Schedule an appointment as soon as possible for a visit in 2 weeks.   Specialty:  Internal Medicine   Contact information:   [address] [place] [address] [phone]        The results of significant diagnostics from this hospitalization (including imaging, microbiology, ancillary and laboratory) are listed below for reference.    Significant Diagnostic Studies: Dg Chest 1 View  03/25/2016  CLINICAL DATA:  Respiratory distress EXAM: CHEST 1 VIEW COMPARISON:  None. FINDINGS: There is evidence of bullous disease in the upper lobes bilaterally. Elsewhere, there is interstitial prominence, concerning for a degree of pulmonary edema. There is cardiomegaly. The pulmonary vascularity is reflective of the underlying bullous disease in the upper lobes. No adenopathy evident. IMPRESSION: Mild cardiomegaly. Suspect interstitial edema. These findings would be indicative of a degree of underlying congestive heart failure. No airspace consolidation. There is extensive bullous  disease in the upper lobes. Electronically Signed   By: Bretta Bang III M.D.   On: 03/25/2016 11:08   Dg Chest 2 View  03/30/2016  CLINICAL DATA:  Hypoxemia EXAM: CHEST  2 VIEW COMPARISON:  03/25/2016 FINDINGS: Severe emphysema. Interstitial accentuation at the lung bases. Upper normal heart size. Mild thoracic spondylosis. Indistinct airspace opacities in the posterior basal segments of both lower lobes. IMPRESSION: 1. Severe emphysema. 2. Interstitial accentuation in the lung bases with indistinct airspace opacities in the posterior basal segments of both lower lobes, favoring atelectasis, pneumonia is not totally excluded. Electronically Signed   By: Gaylyn Rong M.D.   On: 03/30/2016 13:56    Microbiology: Recent Results (from the past 240 hour(s))  MRSA PCR Screening     Status: Abnormal   Collection Time: 03/25/16  5:30 PM  Result Value Ref Range Status   MRSA by PCR POSITIVE (A) NEGATIVE Final    Comment:        The  GeneXpert MRSA Assay (FDA approved for NASAL specimens only), is one component of a comprehensive MRSA colonization surveillance program. It is not intended to diagnose MRSA infection nor to guide or monitor treatment for MRSA infections. RESULT CALLED TO, READ BACK BY AND VERIFIED WITH:  WAGONER,R @ 2249 ON 03/25/16 BY WOODIE,J      Labs: Basic Metabolic Panel:  Recent Labs Lab 03/25/16 1114 03/26/16 0543 03/27/16 0851  NA 139 138 138  K 4.2 5.0 4.5  CL 102 97* 98*  CO2 33* 31 34*  GLUCOSE 119* 140* 145*  BUN 9 23* 38*  CREATININE 0.87 1.40* 1.31*  CALCIUM 8.9 9.2 9.2   Liver Function Tests:  Recent Labs Lab 03/25/16 1114  AST 19  ALT 18  ALKPHOS 68  BILITOT 1.6*  PROT 7.3  ALBUMIN 4.1   No results for input(s): LIPASE, AMYLASE in the last 168 hours. No results for input(s): AMMONIA in the last 168 hours. CBC:  Recent Labs Lab 03/25/16 1114 03/27/16 0851 03/28/16 0406  WBC 18.3* 9.8 11.5*  NEUTROABS 15.8*  --   --   HGB  14.4 12.2* 11.4*  HCT 45.7 39.0 36.0*  MCV 104.1* 103.2* 101.1*  PLT 148* 117* 144*   Cardiac Enzymes:  Recent Labs Lab 03/25/16 1114 03/25/16 1712 03/25/16 2249 03/26/16 0543  TROPONINI 0.03* 0.06* 0.03* 0.03*   BNP: BNP (last 3 results)  Recent Labs  04/17/15 0356 11/30/15 0102 03/25/16 1120  BNP 92.0 24.0 160.0*    ProBNP (last 3 results) No results for input(s): PROBNP in the last 8760 hours.  CBG: No results for input(s): GLUCAP in the last 168 hours.     SignedChaya Jan:  HERNANDEZ ACOSTA,ESTELA  [facility] Pager: [phone] 03/31/2016, 11:52 AM

## 2016-03-31 NOTE — Discharge Instructions (Signed)
Respiratory failure is when your lungs are not working well and your breathing (respiratory) system fails. When respiratory failure occurs, it is difficult for your lungs to get enough oxygen, get rid of carbon dioxide, or both. Respiratory failure can be life threatening.  °Respiratory failure can be acute or chronic. Acute respiratory failure is sudden, severe, and requires emergency medical treatment. Chronic respiratory failure is less severe, happens over time, and requires ongoing treatment.  °WHAT ARE THE CAUSES OF ACUTE RESPIRATORY FAILURE?  °Any problem affecting the heart or lungs can cause acute respiratory failure. Some of these causes include the following: °· Chronic bronchitis and emphysema (COPD).   °· Blood clot going to a lung (pulmonary embolism).   °· Having water in the lungs caused by heart failure, lung injury, or infection (pulmonary edema).   °· Collapsed lung (pneumothorax).   °· Pneumonia.   °· Pulmonary fibrosis.   °· Obesity.   °· Asthma.   °· Heart failure.   °· Any type of trauma to the chest that can make breathing difficult.   °· Nerve or muscle diseases making chest movements difficult. °HOW WILL MY ACUTE RESPIRATORY FAILURE BE TREATED?  °Treatment of acute respiratory failure depends on the cause of the respiratory failure. Usually, you will stay in the intensive care unit so your breathing can be watched closely. Treatment can include the following: °· Oxygen. Oxygen can be delivered through the following: °¨ Nasal cannula. This is small tubing that goes in your nose to give you oxygen. °¨ Face mask. A face mask covers your nose and mouth to give you oxygen. °· Medicine. Different medicines can be given to help with breathing. These can include: °¨ Nebulizers. Nebulizers deliver medicines to open the air passages (bronchodilators). These medicines help to open or relax the airways in the lungs so you can breathe better. They can also help loosen mucus from your  lungs. °¨ Diuretics. Diuretic medicines can help you breathe better by getting rid of extra water in your body. °¨ Steroids. Steroid medicines can help decrease swelling (inflammation) in your lungs. °¨ Antibiotics. °· Chest tube. If you have a collapsed lung (pneumothorax), a chest tube is placed to help reinflate the lung. °· Noninvasive positive pressure ventilation (NPPV). This is a tight-fitting mask that goes over your nose and mouth. The mask has tubing that is attached to a machine. The machine blows air into the tubing, which helps to keep the tiny air sacs (alveoli) in your lungs open. This machine allows you to breathe on your own. °· Ventilator. A ventilator is a breathing machine. When on a ventilator, a breathing tube is put into the lungs. A ventilator is used when you can no longer breathe well enough on your own. You may have low oxygen levels or high carbon dioxide (CO2) levels in your blood. When you are on a ventilator, sedation and pain medicines are given to make you sleep so your lungs can heal. °SEEK IMMEDIATE MEDICAL CARE IF: °· You have shortness of breath (dyspnea) with or without activity. °· You have rapid breathing (tachypnea). °· You are wheezing. °· You are unable to say more than a few words without having to catch your breath. °· You find it very difficult to function normally. °· You have a fast heart rate. °· You have a bluish color to your finger or toe nail beds. °· You have confusion or drowsiness or both. °  °This information is not intended to replace advice given to you by your health care provider. Make sure you discuss   any questions you have with your health care provider. °  °Document Released: 09/09/2013 Document Revised: 05/26/2015 Document Reviewed: 09/09/2013 °Elsevier Interactive Patient Education ©2016 [employer] ° °

## 2016-03-31 NOTE — Progress Notes (Signed)
SATURATION QUALIFICATIONS: (This note is used to comply with regulatory documentation for home oxygen)  Patient Saturations on Room Air at Rest = 93%  Patient Saturations on 4.5 Liters of oxygen while Ambulating =  83%  Pt came back up to 95% after ambulating and resting. Pt placed back on 6 L [place]

## 2016-03-31 NOTE — Care Management Note (Signed)
Case Management Note  Patient Details  Name: Dustin [name] MRN: [id number] Date of Birth: 24-Jan-1956     Expected Discharge Date:                 03/31/2016 Expected Discharge Plan:  Home w Home Health Services  In-House Referral:  NA  Discharge planning Services  CM Consult  Post Acute Care Choice:  Home Health, Resumption of Svcs/PTA Provider, Durable Medical Equipment Choice offered to:  Patient  DME Arranged:  Wheelchair manual DME Agency:  [facility]  HH Arranged:  RN, [name]  Status of Service:  Completed, signed off  If discussed at [employer] Length of Stay Meetings, dates discussed:    Additional Comments: As a courtesy, called [facility] in [place] to see if sleep study could be done sooner than July 25th per sister's request. No other appointments times available but patient is added to a cancellation list, if appt time opens up they will call patient.   Krayton Wortley, Chrystine OilerSharley Diane, RN 03/31/2016, 2:50 PM

## 2016-03-31 NOTE — Progress Notes (Signed)
Physical Therapy Treatment Patient Details Name: Dustin CoriaDennis R [name] MRN: [id number] DOB: 25-Jul-1956 Today's Date: 03/31/2016    History of Present Illness 60 yo M admitted 03/25/2016 due to SOB and noted to be hypoxic with acute respiratory failure.  PMH: acute COPD, and CHF exacerbation, asthma, depression, emphysema of lung, tobacco abuse, chronic respiratory failure on 2L of O2, anxiety, HTN.    PT Comments    Pt received in bed, and was agreeable to PT tx.  Pt was able to ambulate out into the hallway with RW and on 6L of HFNC.  His SpO2 desaturated to 86% at the lowest during tx today, but was able to resaturate to 91% with seated rest.  Pt is concerned about getting from his car into the house and maintaining SpO2 saturation due to the heat.  Discussed option of obtaining a w/c, however pt states that it would be too much of a hassle to do that.  Also discussed using his rollator to get into the house, however pt states that he does not know where it is.  The rollator is at his other house, and he is not sure of it's location due moving to his son's house.  Continue to recommend HHPT upon d/c to improve endurance and activity tolerance.   Follow Up Recommendations  Home health PT     Equipment Recommendations  Wheelchair (measurements PT) (Due to difficulty with long distances, especially in the heat (ie: getting from the car into the house))    Recommendations for Other Services       Precautions / Restrictions Precautions Precautions: None Restrictions Weight Bearing Restrictions: No    Mobility  Bed Mobility Overal bed mobility: Independent                Transfers Overall transfer level: Modified independent                  Ambulation/Gait   Ambulation Distance (Feet): 40 Feet Assistive device: Rolling walker (2 wheeled) Gait Pattern/deviations: WFL(Within Functional Limits)     General Gait Details: Pt able to ambulate out into the hallway today with  RW.  Slow pace with focus on purse lipped breathing.  RW utilized for energy conservation.    Stairs            Wheelchair Mobility    Modified Rankin (Stroke Patients Only)       Balance Overall balance assessment: No apparent balance deficits (not formally assessed)                                  Cognition Arousal/Alertness: Awake/alert Behavior During Therapy: WFL for tasks assessed/performed Overall Cognitive Status: Within Functional Limits for tasks assessed                      Exercises      General Comments        Pertinent Vitals/Pain Pain Assessment: No/denies pain    Home Living                      Prior Function            PT Goals (current goals can now be found in the care plan section) Acute Rehab PT Goals Patient Stated Goal: To go home and be able to breathe PT Goal Formulation: With patient Time For Goal Achievement: 04/07/16 Potential to Achieve Goals: Fair  Progress towards PT goals: Progressing toward goals    Frequency  Min 3X/week    PT Plan Current plan remains appropriate    Co-evaluation             End of Session Equipment Utilized During Treatment: Gait belt;Oxygen Activity Tolerance: Patient tolerated treatment well Patient left: in chair;with call bell/phone within reach     Time: 0900-0925 PT Time Calculation (min) (ACUTE ONLY): 25 min  Charges:  $Gait Training: 23-37 mins                    G Codes:      Dustin [name], PT, DPT X: [phone]

## 2016-03-31 NOTE — Progress Notes (Signed)
Subjective: He says he feels better. He is now down to 6 L on nasal cannula.  Objective: Vital signs in last 24 hours: Temp:  [98.3 F (36.8 C)-98.4 F (36.9 C)] 98.3 F (36.8 C) (07/13 2235) Pulse Rate:  [100-140] 100 (07/13 2235) Resp:  [20-21] 21 (07/13 2235) BP: (113-128)/(66-81) 128/81 mmHg (07/13 2235) SpO2:  [91 %-95 %] 95 % (07/14 0729) Weight change:  Last BM Date: 03/28/16  Intake/Output from previous day: 07/13 0701 - 07/14 0700 In: 723 [P.O.:720; I.V.:3] Out: 2250 [Urine:2250]  PHYSICAL EXAM General appearance: alert, cooperative and no distress Resp: clear to auscultation bilaterally Cardio: regular rate and rhythm, S1, S2 normal, no murmur, click, rub or gallop GI: soft, non-tender; bowel sounds normal; no masses,  no organomegaly Extremities: extremities normal, atraumatic, no cyanosis or edema  Lab Results:  No results found for this or any previous visit (from the past 48 hour(s)).  ABGS No results for input(s): PHART, PO2ART, TCO2, HCO3 in the last 72 hours.  Invalid input(s): PCO2 CULTURES Recent Results (from the past 240 hour(s))  MRSA PCR Screening     Status: Abnormal   Collection Time: 03/25/16  5:30 PM  Result Value Ref Range Status   MRSA by PCR POSITIVE (A) NEGATIVE Final    Comment:        The GeneXpert MRSA Assay (FDA approved for NASAL specimens only), is one component of a comprehensive MRSA colonization surveillance program. It is not intended to diagnose MRSA infection nor to guide or monitor treatment for MRSA infections. RESULT CALLED TO, READ BACK BY AND VERIFIED WITH:  WAGONER,R @ 2249 ON 03/25/16 BY WOODIE,J    Studies/Results: Dg Chest 2 View  03/30/2016  CLINICAL DATA:  Hypoxemia EXAM: CHEST  2 VIEW COMPARISON:  03/25/2016 FINDINGS: Severe emphysema. Interstitial accentuation at the lung bases. Upper normal heart size. Mild thoracic spondylosis. Indistinct airspace opacities in the posterior basal segments of both lower  lobes. IMPRESSION: 1. Severe emphysema. 2. Interstitial accentuation in the lung bases with indistinct airspace opacities in the posterior basal segments of both lower lobes, favoring atelectasis, pneumonia is not totally excluded. Electronically Signed   By: Gaylyn Rong M.D.   On: 03/30/2016 13:56    Medications:  Prior to Admission:  Prescriptions prior to admission  Medication Sig Dispense Refill Last Dose  . Albuterol Sulfate (PROAIR RESPICLICK) 108 (90 BASE) MCG/ACT AEPB Inhale 2 puffs into the lungs every 4 (four) hours as needed (for shortness of breath). Reported on 12/10/2015   unknown  . ALPRAZolam (XANAX) 1 MG tablet Take 1 tablet (1 mg total) by mouth 3 (three) times daily as needed for anxiety. 15 tablet 0 Past Week at Unknown time  . doxazosin (CARDURA) 4 MG tablet Take 4 mg by mouth daily. Reported on 12/10/2015   03/24/2016 at Unknown time  . escitalopram (LEXAPRO) 20 MG tablet Take 20 mg by mouth at bedtime.    03/24/2016 at Unknown time  . gabapentin (NEURONTIN) 600 MG tablet Take 600 mg by mouth 3 (three) times daily.   03/25/2016 at Unknown time  . ipratropium-albuterol (DUONEB) 0.5-2.5 (3) MG/3ML SOLN Take 3 mLs by nebulization 4 (four) times daily as needed (for shortness of breath).   Past Week at Unknown time  . mometasone-formoterol (DULERA) 200-5 MCG/ACT AERO Inhale 2 puffs into the lungs 2 (two) times daily.   03/25/2016 at Unknown time  . pseudoephedrine-guaifenesin (MUCINEX D) 60-600 MG 12 hr tablet Take 1 tablet by mouth every 12 (twelve) hours.  Past Week at Unknown time  . theophylline (UNIPHYL) 400 MG 24 hr tablet Take 400 mg by mouth 2 (two) times daily.   03/25/2016 at Unknown time  . Tiotropium Bromide Monohydrate (SPIRIVA RESPIMAT) 2.5 MCG/ACT AERS Inhale 1 puff into the lungs daily.   03/25/2016 at Unknown time   Scheduled: . azithromycin  500 mg Oral Daily  . escitalopram  20 mg Oral QHS  . gabapentin  600 mg Oral TID  . ipratropium-albuterol  3 mL Nebulization  TID  . methylPREDNISolone (SOLU-MEDROL) injection  40 mg Intravenous Q6H  . polyethylene glycol  17 g Oral BID  . sodium chloride flush  3 mL Intravenous Q12H  . sodium chloride flush  3 mL Intravenous Q12H  . theophylline  400 mg Oral BID   Continuous:  ONG:EXBMWUPRN:sodium chloride, acetaminophen **OR** acetaminophen, albuterol, ALPRAZolam, sodium chloride flush  Assesment: He was admitted with acute hypoxic/hypercapnic respiratory failure. He was treated with BiPAP initially and then has been on high flow oxygen. He has improved. His oxygen requirement is above his baseline. In addition to his COPD he has sleep apnea but is not using CPAP at home and is scheduled for a sleep study. I discussed his situation with him and he does not want to consider any other form of oxygen delivery other than his portable oxygen concentrator but does have a oxygen conserving device associated with it. I asked him if he would think about doing a larger concentrator even short-term and he says no Principal Problem:   Acute respiratory failure with hypoxia (HCC) Active Problems:   COPD with acute exacerbation (HCC)   Acute on chronic diastolic CHF (congestive heart failure) (HCC)   Chronic diastolic heart failure (HCC)   Centrilobular emphysema (HCC)   Acute on chronic respiratory failure with hypercapnia (HCC)   Tobacco use disorder    Plan: Continue treatments.    LOS: 6 days   Brittane Grudzinski L 03/31/2016, 8:32 AM

## 2016-03-31 NOTE — Care Management Note (Signed)
Case Management Note  Patient Details  Name: Dustin [name] MRN: [id number] Date of Birth: 1955-12-08  Expected Discharge Date:    03/31/2016              Expected Discharge Plan:  Home w Home Health Services  In-House Referral:  NA  Discharge planning Services  CM Consult  Post Acute Care Choice:  Home Health, Resumption of Svcs/PTA Provider, Durable Medical Equipment Choice offered to:  Patient  DME Arranged:  Wheelchair manual DME Agency:  [facility]  HH Arranged:  RN, [name]  Status of Service:  Completed, signed off  If discussed at [employer] Length of Stay Meetings, dates discussed:    Additional Comments: Pt discharging home today. Pt is medically stable for DC but currently requiring 6lpm [place]. Again pt offered port tanks and concentrator at home. Pt continues to refuse to give up port concentrator in order to get higher flow oxygen support at home. Pt understands risks associated with going home on 2lpm [place]. Pt states his son will not allow him to have concentrator or port tanks at home. Discussed options of staying with another family member or ALF with pt, which he refuses. PT has recommended WC. Pt refuses wheelchair. [name], is aware of DC today and will obtain resumption orders from chart. Pt aware HH has 48 hours to resume services.   [name], [name], RN 03/31/2016, 10:30 AM

## 2016-04-02 DIAGNOSIS — I2510 Atherosclerotic heart disease of native coronary artery without angina pectoris: Secondary | ICD-10-CM | POA: Diagnosis not present

## 2016-04-02 DIAGNOSIS — I5032 Chronic diastolic (congestive) heart failure: Secondary | ICD-10-CM | POA: Diagnosis not present

## 2016-04-02 DIAGNOSIS — J9610 Chronic respiratory failure, unspecified whether with hypoxia or hypercapnia: Secondary | ICD-10-CM | POA: Diagnosis not present

## 2016-04-02 DIAGNOSIS — J449 Chronic obstructive pulmonary disease, unspecified: Secondary | ICD-10-CM | POA: Diagnosis not present

## 2016-04-02 DIAGNOSIS — Z9981 Dependence on supplemental oxygen: Secondary | ICD-10-CM | POA: Diagnosis not present

## 2016-04-02 DIAGNOSIS — I959 Hypotension, unspecified: Secondary | ICD-10-CM | POA: Diagnosis not present

## 2016-04-03 ENCOUNTER — Ambulatory Visit (HOSPITAL_COMMUNITY)
Admission: RE | Admit: 2016-04-03 | Discharge: 2016-04-03 | Disposition: A | Discharge status: Home / Self Care | Payer: Medicare Other | Source: Ambulatory Visit | Attending: Physician Assistant | Admitting: Physician Assistant

## 2016-04-03 ENCOUNTER — Encounter: Payer: Self-pay | Admitting: *Deleted

## 2016-04-03 ENCOUNTER — Other Ambulatory Visit: Payer: Self-pay | Admitting: *Deleted

## 2016-04-03 DIAGNOSIS — J324 Chronic pansinusitis: Secondary | ICD-10-CM | POA: Insufficient documentation

## 2016-04-03 DIAGNOSIS — J3489 Other specified disorders of nose and nasal sinuses: Secondary | ICD-10-CM | POA: Diagnosis not present

## 2016-04-03 NOTE — Patient Outreach (Signed)
[name]) Care Management  04/03/2016  Dustin CoriaDennis R [name] 1955/09/23 [id number]   Transition of Care outreach/assessment today with [name] by phone. He was discharged from the hospital on Friday. Patient was recently discharged from hospital and all medications have been reviewed. [name] has a follow up appointment scheduled with his primary care provider, Dr. Beverely RisenFozia Khan. He also is scheduled for a sleep study tomorrow night (04/04/16) at 9pm at the practice.   Mr. Dustin LollingWinburn's home health team has started visits. Nursing services began over the weekend and his "Simply Go" O2 tank (light weight/portable) was exchanged for cylinders. He was on O2 @ 2L/Dustin [name] prn prior to this hospitalization and now requires 4L/Dustin [name] continuously. [name] says "I'm not happy about this at all!" He verbalizes frustration with "feeling like I've been put in the house like an invalid". He is anxious to be able to get out of the house and be as independent as possible and wants to be able to get his "simply go" tank back. He is very nervous about taking cylinders in the car with him. He verbalizes understanding about the change being required because he has a higher liter flow need now but is still very unhappy with the change. [name] says he hopes that working with PT at home will help him get back to needing only 2L/[place] so he can exchange tanks again.   Plan: I will follow up with Dr. Milta DeitersKhan's office to make sure they know and the sleep lab knows that Mr. Taillon will have questions and concerns about his oxygen use. I will follow up with [name] weekly by phone for at least 30 days and will make a home visit at some point during that 30 day period, based on his need and schedule.    Marja Kayslisa Daquarius Dubeau MHA,BSN,RN,CCM [facility] Care Management  [phone]

## 2016-04-04 DIAGNOSIS — J449 Chronic obstructive pulmonary disease, unspecified: Secondary | ICD-10-CM | POA: Diagnosis not present

## 2016-04-04 DIAGNOSIS — I959 Hypotension, unspecified: Secondary | ICD-10-CM | POA: Diagnosis not present

## 2016-04-04 DIAGNOSIS — Z9981 Dependence on supplemental oxygen: Secondary | ICD-10-CM | POA: Diagnosis not present

## 2016-04-04 DIAGNOSIS — I2510 Atherosclerotic heart disease of native coronary artery without angina pectoris: Secondary | ICD-10-CM | POA: Diagnosis not present

## 2016-04-04 DIAGNOSIS — I5032 Chronic diastolic (congestive) heart failure: Secondary | ICD-10-CM | POA: Diagnosis not present

## 2016-04-04 DIAGNOSIS — G4733 Obstructive sleep apnea (adult) (pediatric): Secondary | ICD-10-CM | POA: Diagnosis not present

## 2016-04-04 DIAGNOSIS — J9610 Chronic respiratory failure, unspecified whether with hypoxia or hypercapnia: Secondary | ICD-10-CM | POA: Diagnosis not present

## 2016-04-10 ENCOUNTER — Ambulatory Visit: Payer: Self-pay | Admitting: Pharmacist

## 2016-04-10 ENCOUNTER — Other Ambulatory Visit: Payer: Self-pay | Admitting: *Deleted

## 2016-04-10 DIAGNOSIS — I5032 Chronic diastolic (congestive) heart failure: Secondary | ICD-10-CM | POA: Diagnosis not present

## 2016-04-10 DIAGNOSIS — J9611 Chronic respiratory failure with hypoxia: Secondary | ICD-10-CM | POA: Diagnosis not present

## 2016-04-10 DIAGNOSIS — J431 Panlobular emphysema: Secondary | ICD-10-CM | POA: Diagnosis not present

## 2016-04-10 DIAGNOSIS — R911 Solitary pulmonary nodule: Secondary | ICD-10-CM | POA: Diagnosis not present

## 2016-04-10 DIAGNOSIS — J15212 Pneumonia due to Methicillin resistant Staphylococcus aureus: Secondary | ICD-10-CM | POA: Diagnosis not present

## 2016-04-10 NOTE — Patient Outreach (Signed)
[facility]) Care Management  04/10/2016  Dustin [name] 11-10-1955 [id number]   Transition of Care outreach/assessment today with Dustin [name] by phone. He was discharged from the hospital on Friday, July 14. Dustin [name] has been followed at home by [facility] for [facility] services. He saw his primary care provider, Dr. Clayborn Bigness, in follow up and also had a sleep study on 04/04/16.   Dustin [name] is in good spirits today. He says he is happy about the results of his CT scan, stating "I don't know what all the medical terms are but he said nothing really bad is wrong with me." He says he knows from his sleep study that he will require CPAP and expects delivery of that equipment this week.   Prior to his hospitalization, Dustin [name] was scheduled for a 2D Echo. He asks today if he should call to have it rescheduled. I reached out to Dr. Laurelyn Sickle office and spoke with Jocelyn Lamer who stated she would notify Dr. Humphrey Rolls of Dustin [name]'s request. I also told Jocelyn Lamer that Dustin [name] preferred to have this procedure performed at [facility] if possible because of the close proximity to his home.   Plan: I will follow up with Dustin [name] by phone next week to ensure that he has received CPAP equipment, ongoing [place] visits, and a call from his provider's office to rescheduled his 2DEcho.   [facility] CM Care Plan Problem One   Flowsheet Row Most Recent Value  Care Plan Problem One  Acute Health Condition (Respiratory Symptoms)  Role Documenting the Problem One  Care Management [place] for Problem One  Active  [facility] Long Term Goal (31-90 days)  Over the next 60 days, patient will verbalize understanding of plan of care for treatment of respiratory symptoms  [facility] Long Term Goal Start Date  04/03/16  Interventions for Problem One Long Term Goal  Telephonic assessment performed,  notified provider of patient concerns about oxygen use  [facility] CM Short Term Goal #1 (0-30 days)  Over the next 30  days, patient will attend all scheduled procedure and provider appointments  [facility] CM Short Term Goal #1 Start Date  04/03/16  Interventions for Short Term Goal #1  Notified PCP office of need for re-schedule for 2Decho missed when patient was hospitalized  [facility] CM Short Term Goal #2 (0-30 days)  Over the next 7 days, patient will verbalize understanding of plan for oxygen use and appropriate DME  [facility] CM Short Term Goal #2 Start Date  04/10/16  Interventions for Short Term Goal #2  Utilizing discussion, reviewed with patient mechanics and purpose of CPAP,  advised patient to contact DME office or me if anticipated supplies are not delivered or if he has questions about instsructions  [facility] CM Short Term Goal #3 (0-30 days)  Over the next 14 days, patient will see provider in follow up as scheduled  [facility] CM Short Term Goal #3 Start Date  03/03/16  [facility] CM Short Term Goal #3 Met Date  04/03/16  Interventions for Short Tern Goal #3  REviewed plans for follow up appointment,  ensured transportation        [facility]  [phone]

## 2016-04-11 ENCOUNTER — Other Ambulatory Visit: Payer: Self-pay | Admitting: Pharmacist

## 2016-04-11 DIAGNOSIS — J441 Chronic obstructive pulmonary disease with (acute) exacerbation: Secondary | ICD-10-CM | POA: Diagnosis not present

## 2016-04-11 DIAGNOSIS — Z7951 Long term (current) use of inhaled steroids: Secondary | ICD-10-CM | POA: Diagnosis not present

## 2016-04-11 DIAGNOSIS — Z9981 Dependence on supplemental oxygen: Secondary | ICD-10-CM | POA: Diagnosis not present

## 2016-04-11 DIAGNOSIS — I5033 Acute on chronic diastolic (congestive) heart failure: Secondary | ICD-10-CM | POA: Diagnosis not present

## 2016-04-11 DIAGNOSIS — J9621 Acute and chronic respiratory failure with hypoxia: Secondary | ICD-10-CM | POA: Diagnosis not present

## 2016-04-11 DIAGNOSIS — I959 Hypotension, unspecified: Secondary | ICD-10-CM | POA: Diagnosis not present

## 2016-04-11 DIAGNOSIS — I2510 Atherosclerotic heart disease of native coronary artery without angina pectoris: Secondary | ICD-10-CM | POA: Diagnosis not present

## 2016-04-12 ENCOUNTER — Other Ambulatory Visit: Payer: Self-pay | Admitting: Licensed Clinical Social Worker

## 2016-04-12 NOTE — Patient Outreach (Signed)
Assessment:  CSW spoke via phone with client on 04/12/16. CSW verified client identity. Dustin [name] and CSW spoke of client needs.  Client was recently hospitalized but discharged from the hospital to return to home of his son in the community. Client has prescribed medications and is taking medications as prescribed. Client is receiving [facility] nursing support with RN Marja Kays. Client is eating adequately and sleeping adequately.  CSW spoke with client about client care plan. CSW encouraged client to communicate with CSW in next 30 days to continue to discuss community resources for client and to discuss ways these community resources may help client with financial needs of client.CSW spoke with Dustin [name] about [employer] application process. CSW spoke with Dustin [name] about Medicaid application process.  Client said he becomes fatigued occasionally. He said he sometimes becomes short of breath.  He is using oxygen in the home as prescribed. He sees Dr. Welton Flakes as primary care doctor.  Client said he is working on setting up new appointment with Dr. Welton Flakes. Marland Kitchen  He is trying to attend all scheduled client medical appointments.  Client said he has no transportation needs at present. CSW encouraged Dustin [name] to call CSW at [phone] to discuss social work needs of client.   Plan:  Client to communicate with CSW in next 30 days to discuss community resources for client and ways these resources may help client with financial needs of client.  CSW to collaborate with RN Marja Kays in monitoring client needs.  CSW to call client in 4 weeks to assess client needs at that time.  Dustin [name].Jaramie Bastos MSW, [name] Licensed Clinical Social Worker [name] Care Management [phone]

## 2016-04-12 NOTE — Patient Outreach (Signed)
[name]) Care Management  [facility]   04/12/2016  Dustin [name] [birth year] [id number]  Subjective:  [facility] CM Pharmacy placed a call to patient to follow-up his medication patient assistance and review his medications with him following his recent hospital discharge from 03/31/16.   Patient verified HIPAA details via phone and his medications were reviewed by comparing hospital discharge medication list, medication list in chart and patient report.    Patient reports that he did receive the application in the mail for [employer] for his albuterol inhaler to attempt to obtain patient assistance with it, and he reports he will complete application and mail to [facility].    He denies medication questions/concerns at this time.   Objective:   Current Medications: Current Outpatient Prescriptions  Medication Sig Dispense Refill  . Albuterol Sulfate (PROAIR RESPICLICK) 108 (90 BASE) MCG/ACT AEPB Inhale 2 puffs into the lungs every 4 (four) hours as needed (for shortness of breath). Reported on 12/10/2015    . ALPRAZolam (XANAX) 1 MG tablet Take 1 tablet (1 mg total) by mouth 3 (three) times daily as needed for anxiety. 15 tablet 0  . doxazosin (CARDURA) 4 MG tablet Take 4 mg by mouth daily. Reported on 12/10/2015    . escitalopram (LEXAPRO) 20 MG tablet Take 20 mg by mouth at bedtime.     . gabapentin (NEURONTIN) 600 MG tablet Take 600 mg by mouth 3 (three) times daily.    Marland Kitchen ipratropium-albuterol (DUONEB) 0.5-2.5 (3) MG/3ML SOLN Take 3 mLs by nebulization 4 (four) times daily as needed (for shortness of breath).    . mometasone-formoterol (DULERA) 200-5 MCG/ACT AERO Inhale 2 puffs into the lungs 2 (two) times daily.    . theophylline (UNIPHYL) 400 MG 24 hr tablet Take 400 mg by mouth 2 (two) times daily.    . Tiotropium Bromide Monohydrate (SPIRIVA RESPIMAT) 2.5 MCG/ACT AERS Inhale 1 puff into the lungs daily.    . predniSONE (DELTASONE) 10 MG tablet Take 1 tablet (10 mg  total) by mouth daily with breakfast. Take 6 tablets today and then decrease by 1 tablet daily until none are left. 21 tablet 0  . pseudoephedrine-guaifenesin (MUCINEX D) 60-600 MG 12 hr tablet Take 1 tablet by mouth every 12 (twelve) hours.     No current facility-administered medications for this visit.     Functional Status: In your present state of health, do you have any difficulty performing the following activities: 03/25/2016 12/13/2015  Hearing? N N  Vision? N Y  Difficulty concentrating or making decisions? N Y  Walking or climbing stairs? N Y  Dressing or bathing? N N  Doing errands, shopping? N Y  Quarry manager and eating ? - N  Using the Toilet? - N  In the past six months, have you accidently leaked urine? - N  Do you have problems with loss of bowel control? - N  Managing your Medications? - Y  Managing your Finances? - Y  Housekeeping or managing your Housekeeping? - Y  Some recent data might be hidden    Fall/Depression Screening: PHQ 2/9 Scores 04/12/2016 03/29/2016 03/02/2016 02/02/2016 12/13/2015  PHQ - 2 Score 2 2 2 2 2   PHQ- 9 Score 9 7 8 8 8     Assessment:  Drugs sorted by system:  Neurologic/Psychologic: -alprazolam -escitalopram -gabapentin  Cardiovascular: -doxazosin  Pulmonary/Allergy: -albuterol inhaler -ipratropium/albuterol nebs  -theophylline -mometasone/formoterol (Dulera)  -tiotropium (Spiriva)  Miscellaneous: -prednisone taper  -pseudoephedrine/guaifenesin   New medications on discharge included:  Prednisone taper and pseudoephedrine/guaifenesin    Patient was recently discharged from hospital and all medications have been reviewed.  Plan:  Will route note to patient's PCP given medication review was completed following his hospital discharge.    Will continue to follow with patient to see if he is able to obtain manufacturer patient assistance with his albuterol inhaler.    [name], PharmD, [facility] Clinical  Pharmacist [name] [phone]

## 2016-04-14 DIAGNOSIS — J9621 Acute and chronic respiratory failure with hypoxia: Secondary | ICD-10-CM | POA: Diagnosis not present

## 2016-04-14 DIAGNOSIS — I959 Hypotension, unspecified: Secondary | ICD-10-CM | POA: Diagnosis not present

## 2016-04-14 DIAGNOSIS — I2510 Atherosclerotic heart disease of native coronary artery without angina pectoris: Secondary | ICD-10-CM | POA: Diagnosis not present

## 2016-04-14 DIAGNOSIS — Z7951 Long term (current) use of inhaled steroids: Secondary | ICD-10-CM | POA: Diagnosis not present

## 2016-04-14 DIAGNOSIS — J441 Chronic obstructive pulmonary disease with (acute) exacerbation: Secondary | ICD-10-CM | POA: Diagnosis not present

## 2016-04-14 DIAGNOSIS — Z9981 Dependence on supplemental oxygen: Secondary | ICD-10-CM | POA: Diagnosis not present

## 2016-04-14 DIAGNOSIS — I5033 Acute on chronic diastolic (congestive) heart failure: Secondary | ICD-10-CM | POA: Diagnosis not present

## 2016-04-17 ENCOUNTER — Encounter: Payer: Self-pay | Admitting: *Deleted

## 2016-04-17 ENCOUNTER — Other Ambulatory Visit: Payer: Self-pay | Admitting: *Deleted

## 2016-04-17 DIAGNOSIS — J441 Chronic obstructive pulmonary disease with (acute) exacerbation: Secondary | ICD-10-CM | POA: Diagnosis not present

## 2016-04-17 DIAGNOSIS — I959 Hypotension, unspecified: Secondary | ICD-10-CM | POA: Diagnosis not present

## 2016-04-17 DIAGNOSIS — I5033 Acute on chronic diastolic (congestive) heart failure: Secondary | ICD-10-CM | POA: Diagnosis not present

## 2016-04-17 DIAGNOSIS — J9621 Acute and chronic respiratory failure with hypoxia: Secondary | ICD-10-CM | POA: Diagnosis not present

## 2016-04-17 DIAGNOSIS — Z9981 Dependence on supplemental oxygen: Secondary | ICD-10-CM | POA: Diagnosis not present

## 2016-04-17 DIAGNOSIS — Z7951 Long term (current) use of inhaled steroids: Secondary | ICD-10-CM | POA: Diagnosis not present

## 2016-04-17 DIAGNOSIS — I2510 Atherosclerotic heart disease of native coronary artery without angina pectoris: Secondary | ICD-10-CM | POA: Diagnosis not present

## 2016-04-17 NOTE — Patient Outreach (Signed)
[name]) Care Management  04/17/2016  Dustin [name] [birth year] [id number]   Unable to reach Mr. Mccart by phone today for continued follow up regarding COPD disease management.   Plan: I will reach out to Mr. Cantrell by phone on Thursday.    Marja Kays MHA,BSN,RN,CCM [facility] Care Management  [phone]

## 2016-04-20 ENCOUNTER — Other Ambulatory Visit: Payer: Self-pay | Admitting: *Deleted

## 2016-04-20 DIAGNOSIS — I959 Hypotension, unspecified: Secondary | ICD-10-CM | POA: Diagnosis not present

## 2016-04-20 DIAGNOSIS — I5033 Acute on chronic diastolic (congestive) heart failure: Secondary | ICD-10-CM | POA: Diagnosis not present

## 2016-04-20 DIAGNOSIS — Z7951 Long term (current) use of inhaled steroids: Secondary | ICD-10-CM | POA: Diagnosis not present

## 2016-04-20 DIAGNOSIS — I2510 Atherosclerotic heart disease of native coronary artery without angina pectoris: Secondary | ICD-10-CM | POA: Diagnosis not present

## 2016-04-20 DIAGNOSIS — J9621 Acute and chronic respiratory failure with hypoxia: Secondary | ICD-10-CM | POA: Diagnosis not present

## 2016-04-20 DIAGNOSIS — J441 Chronic obstructive pulmonary disease with (acute) exacerbation: Secondary | ICD-10-CM | POA: Diagnosis not present

## 2016-04-20 DIAGNOSIS — Z9981 Dependence on supplemental oxygen: Secondary | ICD-10-CM | POA: Diagnosis not present

## 2016-04-20 NOTE — Patient Outreach (Signed)
[name]) Care Management  04/20/2016  AYLEN SERRITELLA Mar 17, 1956 [id number]   I spoke with Dustin [name] by phone this morning to discuss his progress with COPD management and his recent pulmonary workup. He says he underwent his study earlier this week and has some questions about DME - O2 and CPAP. I also received a message from the nursing staff at Dr. Milta Deiters office and returned a call, leaving a message. I reached out to the office this morning, leaving a message requesting a return call so that we can collaborate and help Mr. Verrill with his DME needs.  Plan: If I do not hear from the primary care office today, I will reach out to them again tomorrow.    Marja Kays MHA,BSN,RN,CCM [facility] Care Management  [phone]

## 2016-04-21 ENCOUNTER — Other Ambulatory Visit: Payer: Self-pay | Admitting: *Deleted

## 2016-04-21 ENCOUNTER — Encounter: Payer: Self-pay | Admitting: *Deleted

## 2016-04-21 NOTE — Patient Outreach (Signed)
[name]) Care Management  04/21/2016  Dustin [name] December 01, 1955 [id number]   I reached out again this morning to Chip Boer at Dr. Milta Deiters office leaving a voice message on the nursing voice mail requesting a return call regarding care coordination needs surrounding Dustin [name]'s DME (O2 and CPAP).   I spoke with Dustin [name] by phone today also. His primary concern regarding oxygen use is the desire for a lightweight tank. I reached out to [employer] Administrator, arts of light weight portable tanks) and confirmed that insurance does not cover the cost of their light weight tank and they do not rent or lease the tanks. Out of pocket cost for tank with service is >$2,500. The [employer] light weight tank can deliver up to 6L/min with the pulse delivery system. I reached out to the respiratory therapy department at [facility] who currently supplies O2 tanks to Dustin [name]. He has a Simply[employer] pulse tank at home that can deliver up to 5l/min and standard tanks that deliver 6l/min (continuous) as prescribed. [facility] indicated that they do not have a light weight tank that delivers > 5l/min continuous or pulse.   I discussed with Dustin [name] that his current supplemental oxygen demand is 6L/min continuously and that there are no lightweight delivery systems that his insurance will pay toward available. He is unable to pay for the Inogen tank because of cost. Dustin [name] is very discouraged about all this because he wants to remain active and not be house bound and states that having the larger tanks makes him feel "tied to the house".   I left another message for Chip Boer at Dr. Milta Deiters office detailing my calls to [facility], [employer], and Dustin [name].   Plan: I will follow up with Dustin [name] the week of 05/01/16.    Marja Kays MHA,BSN,RN,CCM [facility] Care Management  [phone]

## 2016-04-24 ENCOUNTER — Other Ambulatory Visit: Payer: Self-pay | Admitting: *Deleted

## 2016-04-24 NOTE — Patient Outreach (Signed)
Transition of care call to patient, this RN covering for [facility]  Spoke with patient. He states no new issues, he states his main issue is with the oxygen and Alisa is following up on this for him.  Patient reports that he has ECHO scheduled for Wednesday, he plans to have Dr. Armstead PeaksGoulan, cardiologist review results with him. He states his regular MD will get a copy and forward to Cardiologist. He will see this cardiologist as he was his father's cardiologist and he has a lot of confidence in his opinion.  No new concerns voiced this call.  Plan: Reminded patient that his assigned RNCM would contact him next week. Gave RNCM contact for any issues this week. Will report to assigned RNCM as indicated for any follow up.  Alben SpittleMary E. Albertha GheeNiemczura, RN, BSN, CCM  [facility] [employer] Care Manager [phone]

## 2016-04-25 DIAGNOSIS — J441 Chronic obstructive pulmonary disease with (acute) exacerbation: Secondary | ICD-10-CM | POA: Diagnosis not present

## 2016-04-25 DIAGNOSIS — I2510 Atherosclerotic heart disease of native coronary artery without angina pectoris: Secondary | ICD-10-CM | POA: Diagnosis not present

## 2016-04-25 DIAGNOSIS — I959 Hypotension, unspecified: Secondary | ICD-10-CM | POA: Diagnosis not present

## 2016-04-25 DIAGNOSIS — Z7951 Long term (current) use of inhaled steroids: Secondary | ICD-10-CM | POA: Diagnosis not present

## 2016-04-25 DIAGNOSIS — J9621 Acute and chronic respiratory failure with hypoxia: Secondary | ICD-10-CM | POA: Diagnosis not present

## 2016-04-25 DIAGNOSIS — I5033 Acute on chronic diastolic (congestive) heart failure: Secondary | ICD-10-CM | POA: Diagnosis not present

## 2016-04-25 DIAGNOSIS — Z9981 Dependence on supplemental oxygen: Secondary | ICD-10-CM | POA: Diagnosis not present

## 2016-04-26 ENCOUNTER — Ambulatory Visit (HOSPITAL_COMMUNITY)
Admission: RE | Admit: 2016-04-26 | Discharge: 2016-04-26 | Disposition: A | Discharge status: Home / Self Care | Payer: Medicare Other | Source: Ambulatory Visit | Attending: Internal Medicine | Admitting: Internal Medicine

## 2016-04-26 ENCOUNTER — Other Ambulatory Visit: Payer: Self-pay | Admitting: Pharmacist

## 2016-04-26 DIAGNOSIS — I272 Other secondary pulmonary hypertension: Secondary | ICD-10-CM | POA: Diagnosis not present

## 2016-04-26 DIAGNOSIS — I358 Other nonrheumatic aortic valve disorders: Secondary | ICD-10-CM | POA: Diagnosis not present

## 2016-04-26 DIAGNOSIS — I509 Heart failure, unspecified: Secondary | ICD-10-CM | POA: Diagnosis not present

## 2016-04-26 DIAGNOSIS — I5033 Acute on chronic diastolic (congestive) heart failure: Secondary | ICD-10-CM | POA: Diagnosis not present

## 2016-04-26 DIAGNOSIS — Z87891 Personal history of nicotine dependence: Secondary | ICD-10-CM | POA: Insufficient documentation

## 2016-04-26 DIAGNOSIS — J449 Chronic obstructive pulmonary disease, unspecified: Secondary | ICD-10-CM | POA: Diagnosis not present

## 2016-04-26 LAB — ECHOCARDIOGRAM COMPLETE
AV Area VTI index: 1.24 cm2/m2
AV Area VTI: 2.3 cm2
AV Mean grad: 5 mmHg
AV Peak grad: 13 mmHg
AV VEL mean LVOT/AV: 0.73
AV vel: 2.65
AVAREAMEANV: 2.29 cm2
AVAREAMEANVIN: 1.07 cm2/m2
AVPKVEL: 180 cm/s
Ao pk vel: 0.73 m/s
CHL CUP AV PEAK INDEX: 1.07
CHL CUP DOP CALC LVOT VTI: 26.1 cm
CHL CUP MV DEC (S): 256
CHL CUP RV SYS PRESS: 35 mmHg
CHL CUP TV REG PEAK VELOCITY: 282 cm/s
DOP CAL AO MEAN VELOCITY: 98.7 cm/s
E decel time: 256 msec
E/e' ratio: 13.1
FS: 32 % (ref 28–44)
IVS/LV PW RATIO, ED: 1.2
LA vol index: 23.5 mL/m2
LADIAMINDEX: 1.82 cm/m2
LASIZE: 39 mm
LAVOL: 50.4 mL
LAVOLA4C: 56.6 mL
LEFT ATRIUM END SYS DIAM: 39 mm
LV E/e' medial: 13.1
LV E/e'average: 13.1
LV PW d: 12.3 mm — AB (ref 0.6–1.1)
LV SIMPSON'S DISK: 62
LV dias vol index: 26 mL/m2
LV sys vol index: 10 mL/m2
LV sys vol: 21 mL (ref 21–61)
LVDIAVOL: 56 mL — AB (ref 62–150)
LVELAT: 7.94 cm/s
LVOT area: 3.14 cm2
LVOT diameter: 20 mm
LVOT peak grad rest: 7 mmHg
LVOTPV: 132 cm/s
LVOTSV: 82 mL
LVOTVTI: 0.84 cm
Lateral S' vel: 14.6 cm/s
MV Peak grad: 4 mmHg
MV pk E vel: 104 m/s
MVPKAVEL: 137 m/s
Stroke v: 35 ml
TAPSE: 24.9 mm
TDI e' lateral: 7.94
TDI e' medial: 5.98
TRMAXVEL: 282 cm/s
VTI: 30.9 cm
Valve area index: 1.24
Valve area: 2.65 cm2

## 2016-04-26 NOTE — Patient Outreach (Signed)
[name]) Care Management  04/26/2016  Hershal CoriaDennis R Duran [birth year] [id number]  Follow-up phone call to patient today to follow-up his [employer] patient assistance application for albuterol inhaler.   Patient answered call and verified HIPAA details.    He states that he is still working on completing patient assistance application.   Patient denies any other medication questions/concerns at this time.   Plan:  Will place a follow-up call in about 1 month to patient.  Albuterol inhaler patient assistance application is the only concern patient reports at this time related to his medications.   Tommye StandardKevin Fredie Majano, PharmD, [facility] Clinical Pharmacist [name] [phone]

## 2016-04-26 NOTE — Progress Notes (Signed)
*  PRELIMINARY RESULTS* Echocardiogram 2D Echocardiogram has been performed.  Stacey DrainWhite, Chanti Golubski J 04/26/2016, 2:25 PM

## 2016-04-27 DIAGNOSIS — I959 Hypotension, unspecified: Secondary | ICD-10-CM | POA: Diagnosis not present

## 2016-04-27 DIAGNOSIS — Z7951 Long term (current) use of inhaled steroids: Secondary | ICD-10-CM | POA: Diagnosis not present

## 2016-04-27 DIAGNOSIS — J9621 Acute and chronic respiratory failure with hypoxia: Secondary | ICD-10-CM | POA: Diagnosis not present

## 2016-04-27 DIAGNOSIS — J441 Chronic obstructive pulmonary disease with (acute) exacerbation: Secondary | ICD-10-CM | POA: Diagnosis not present

## 2016-04-27 DIAGNOSIS — I2510 Atherosclerotic heart disease of native coronary artery without angina pectoris: Secondary | ICD-10-CM | POA: Diagnosis not present

## 2016-04-27 DIAGNOSIS — Z9981 Dependence on supplemental oxygen: Secondary | ICD-10-CM | POA: Diagnosis not present

## 2016-04-27 DIAGNOSIS — I5033 Acute on chronic diastolic (congestive) heart failure: Secondary | ICD-10-CM | POA: Diagnosis not present

## 2016-05-02 DIAGNOSIS — J9621 Acute and chronic respiratory failure with hypoxia: Secondary | ICD-10-CM | POA: Diagnosis not present

## 2016-05-02 DIAGNOSIS — Z7951 Long term (current) use of inhaled steroids: Secondary | ICD-10-CM | POA: Diagnosis not present

## 2016-05-02 DIAGNOSIS — Z9981 Dependence on supplemental oxygen: Secondary | ICD-10-CM | POA: Diagnosis not present

## 2016-05-02 DIAGNOSIS — I5033 Acute on chronic diastolic (congestive) heart failure: Secondary | ICD-10-CM | POA: Diagnosis not present

## 2016-05-02 DIAGNOSIS — J441 Chronic obstructive pulmonary disease with (acute) exacerbation: Secondary | ICD-10-CM | POA: Diagnosis not present

## 2016-05-02 DIAGNOSIS — I959 Hypotension, unspecified: Secondary | ICD-10-CM | POA: Diagnosis not present

## 2016-05-02 DIAGNOSIS — I2510 Atherosclerotic heart disease of native coronary artery without angina pectoris: Secondary | ICD-10-CM | POA: Diagnosis not present

## 2016-05-03 DIAGNOSIS — R0602 Shortness of breath: Secondary | ICD-10-CM | POA: Diagnosis not present

## 2016-05-05 DIAGNOSIS — J9621 Acute and chronic respiratory failure with hypoxia: Secondary | ICD-10-CM | POA: Diagnosis not present

## 2016-05-05 DIAGNOSIS — Z7951 Long term (current) use of inhaled steroids: Secondary | ICD-10-CM | POA: Diagnosis not present

## 2016-05-05 DIAGNOSIS — Z9981 Dependence on supplemental oxygen: Secondary | ICD-10-CM | POA: Diagnosis not present

## 2016-05-05 DIAGNOSIS — I2510 Atherosclerotic heart disease of native coronary artery without angina pectoris: Secondary | ICD-10-CM | POA: Diagnosis not present

## 2016-05-05 DIAGNOSIS — I5033 Acute on chronic diastolic (congestive) heart failure: Secondary | ICD-10-CM | POA: Diagnosis not present

## 2016-05-05 DIAGNOSIS — I959 Hypotension, unspecified: Secondary | ICD-10-CM | POA: Diagnosis not present

## 2016-05-05 DIAGNOSIS — J441 Chronic obstructive pulmonary disease with (acute) exacerbation: Secondary | ICD-10-CM | POA: Diagnosis not present

## 2016-05-09 ENCOUNTER — Other Ambulatory Visit: Payer: Self-pay | Admitting: *Deleted

## 2016-05-09 ENCOUNTER — Encounter: Payer: Self-pay | Admitting: *Deleted

## 2016-05-09 NOTE — Patient Outreach (Signed)
[facility]  05/09/2016  Dustin CoriaDennis R [name] October 30, 1955 [id number]   Mr. Dustin PhenixDennis [name] s is a 60 year old gentleman who was admitted to the hospital from 12/06/15 to 12/10/15 with acute on chronic hypoxic respiratory failure secondary to COPD flare requiring mechanical ventilation. Dustin [name] has history of CHF w/ acute episode during that hospital admission.  As a COPD Gold IV patient, Dustin [name] [facility] the [facility] for transition of care services and COPD disease management and care coordination. He was being actively followed in the community for nursing case management, clinical social work, and pharmacy services. Concurrently, Dustin [name] was being followed at home by home health nursing through [facility]. Dustin [name] has been on O2 continuously at home. He lives in his home with his adult children.   I spoke with Dustin [name] by phone again yesterday. His primary concern regarding oxygen use is the desire for a lightweight tank. A few weeks ago, I reached out to [employer](maker of light weight portable tanks) and confirmed that insurance does not cover the cost of their light weight tank and they do not rent or lease the tanks. Out of pocket cost for tank with service is >$2,500. The [employer] light weight tank can deliver up to 6L/min with the pulse delivery system. I reached out to the respiratory therapy department at [facility] who currently supplies O2 tanks to Dustin [name]. He has a [employer] pulse tank at home that can deliver up to 5l/min and standard tanks that deliver 6l/min (continuous) as prescribed. [facility] indicated that they do not have a light weight tank that delivers > 5l/min continuous or pulse.   Dustin [name]'s PRIMARY CONCERN is oxygen systems. He is adamant about exploring other delivery systems for oxygen that will not inhibit his ability to leave the house and lead a more active life. I  spent > 30 minutes on the phone with Dustin [name] today and during that time we discussed how the different delivery systems work, how his body's oxygen demands dictate, to some degree, the options for O2 delivery systems, and the possible problems associated with not using the appropriate liter flow of O2 needed. He is very discouraged about his need for a higher liter flow which limits the type of tanks that he can use and remain active and socially engaged.   Dustin [name] has appointments in the next 2 weeks with pulmonary and cardiology providers. His goal, per his report to me, is to see if he can be re-evaluated for oxygen demand/needs so that he can explore how he might be able to use lighter weight tanks and remain as active as possible. I promised him I would gather as much information as possible about his recent evaluations and provide a synopsis of that information to both these providers.    2D Echo (04/26/2016) - Study Conclusions  - Left ventricle: Systolic function was normal. The estimated   ejection fraction was in the range of 60% to 65%. Wall motion was   normal; there were no regional wall motion abnormalities. Doppler   parameters are consistent with abnormal left ventricular   relaxation (grade 1 diastolic dysfunction). - Aortic valve: Mildly calcified annulus. Normal thickness   leaflets. Valve area (VTI): 2.83 cm^2. Valve area (Vmax): 2.29   cm^2. Valve area (Vmean): 1.84 cm^2. - Atrial septum: No defect or patent foramen ovale was identified. - Pulmonary arteries: Systolic pressure was mildly increased.  PA   peak pressure: 35 mm Hg (S). - Technically adequate study. - EF 60-65%  Chest X Ray 03/30/16:   FINDINGS: Severe emphysema. Interstitial accentuation at the lung bases. Upper normal heart size. Mild thoracic spondylosis. Indistinct airspace opacities in the posterior basal segments of both lower lobes.  IMPRESSION: 1. Severe emphysema. 2. Interstitial  accentuation in the lung bases with indistinct airspace opacities in the posterior basal segments of both lower lobes, favoring atelectasis, pneumonia is not totally excluded  CT Maxillofacial w/o Contrast (04/03/16) IMPRESSION: 1. Previous paranasal sinus surgery greater on the left. Overall mild residual sinus mucosal thickening. 2. Symmetric appearing nasal cavity mucosal thickening with some retained secretions, raising possibility of rhinitis.  Pulmonary Spirometry (03/27/16) - I do not have access to these results.  Sleep Study (04/10/16) - I do not have access to these results.    Plan:  Dustin [name] will call for any new or worsened symptoms related to chronic respiratory disease.   Dustin [name]'s home health nurse will continue weekly visits for pulmonary assessment.   Dustin [name] will attend attend scheduled pulmonary and cardiology appointments and has transportation to both.   I will reach out to the primary care provider and the cardiologist, providing updated information as outlined above. I am happy to help with care coordination related to Dustin [name]'s pulmonary care and DME or in any other way I can.   I will follow up with Dustin [name] by phone after his upcoming pulmonary/cardiology appointments.  [facility] CM Care Plan Problem One   Flowsheet Row Most Recent Value  Care Plan Problem One  Acute Health Condition (Respiratory Symptoms)  Role Documenting the Problem One  Care Management Coordinator  Care Plan for Problem One  Active  [facility] Long Term Goal (31-90 days)  Over the next 60 days, patient will verbalize understanding of plan of care for treatment of respiratory symptoms  [facility] Long Term Goal Start Date  04/03/16  Interventions for Problem One Long Term Goal  Telephonic assessment performed,  notified provider of patient concerns about oxygen use  [facility] CM Short Term Goal #4 Start Date  05/09/16  Interventions for Short Term Goal #4  Discussed patient information to  provide and questions to ask related to pulmonary symptoms and long term management related to Oxygen needs and delivery system options     Marja Kayslisa Manuela [facility],BSN,RN,CCM [facility] Care Management  [phone]

## 2016-05-10 DIAGNOSIS — I5032 Chronic diastolic (congestive) heart failure: Secondary | ICD-10-CM | POA: Diagnosis not present

## 2016-05-10 DIAGNOSIS — J9611 Chronic respiratory failure with hypoxia: Secondary | ICD-10-CM | POA: Diagnosis not present

## 2016-05-10 DIAGNOSIS — J432 Centrilobular emphysema: Secondary | ICD-10-CM | POA: Diagnosis not present

## 2016-05-10 DIAGNOSIS — G4733 Obstructive sleep apnea (adult) (pediatric): Secondary | ICD-10-CM | POA: Diagnosis not present

## 2016-05-10 DIAGNOSIS — I279 Pulmonary heart disease, unspecified: Secondary | ICD-10-CM | POA: Diagnosis not present

## 2016-05-11 DIAGNOSIS — I5033 Acute on chronic diastolic (congestive) heart failure: Secondary | ICD-10-CM | POA: Diagnosis not present

## 2016-05-11 DIAGNOSIS — I959 Hypotension, unspecified: Secondary | ICD-10-CM | POA: Diagnosis not present

## 2016-05-11 DIAGNOSIS — Z7951 Long term (current) use of inhaled steroids: Secondary | ICD-10-CM | POA: Diagnosis not present

## 2016-05-11 DIAGNOSIS — J9621 Acute and chronic respiratory failure with hypoxia: Secondary | ICD-10-CM | POA: Diagnosis not present

## 2016-05-11 DIAGNOSIS — Z9981 Dependence on supplemental oxygen: Secondary | ICD-10-CM | POA: Diagnosis not present

## 2016-05-11 DIAGNOSIS — I2510 Atherosclerotic heart disease of native coronary artery without angina pectoris: Secondary | ICD-10-CM | POA: Diagnosis not present

## 2016-05-11 DIAGNOSIS — J441 Chronic obstructive pulmonary disease with (acute) exacerbation: Secondary | ICD-10-CM | POA: Diagnosis not present

## 2016-05-13 NOTE — Progress Notes (Signed)
Cardiology Office Note   Date:  05/16/2016   ID:  Dustin CoriaDennis R [name], DOB [birth year], MRN [id number]  Referring Doctor:  Lyndon CodeKHAN, FOZIA M, MD   Cardiologist:   Almond LintAileen Dekari Bures, MD   Reason for consultation:  Chief Complaint  Patient presents with  . Establish Care    possible CHF      History of Present Illness: Dustin [name] is a 60 y.o. male who presents for Follow-up after hospital stay. He was admitted for COPD exacerbation, acute on chronic diastolic CHF. It was recommended that he follow up in the office for outpatient stress testing.   Patient has been doing fairly well. His shortness of breath is chronic. He has severe lung disease. He is a oxygen 24 7. He wears CPAP at night. He denies any chest pains or chest tightness. His shortness of breath has not gotten worse lately. He has very limited functional capacity due to shortness of breath.  In terms of the tachycardia, he used to be on diltiazem but it was discontinued for some reason that he cannot recall. It was resumed recently. Per medical records, this was many use for the sinus tachycardia. While he was in the hospital, the medication had to be held for low blood pressures.  No PND, orthopnea, edema. No abdominal pain.  ROS:  Please see the history of present illness. Aside from mentioned under HPI, all other systems are reviewed and negative.     Past Medical History:  Diagnosis Date  . Allergy   . Anxiety   . Anxiety   . Asthma   . Chronic diastolic CHF (congestive heart failure) (HCC)    a. echo 07/2013: EF 60-65%, DD, biatrial dilatation, Ao sclerosis, dilated RV, moderate pulmonary HTN, elevated CV and RA pressures; b. patient reported echo at Dr. Milta DeitersKhan's office 02/2015 - his office does not have record of him being a pt there c. echo 11/2015: EF 60-65%, Grade 1 DD, mod-severe pulm pressures  . Chronic respiratory failure (HCC)    a. on 2L via nasal cannula; b. secondary to COPD  . COPD (chronic obstructive  pulmonary disease) (HCC)   . Depression   . Depression   . Depression   . Emphysema of lung (HCC)   . GERD (gastroesophageal reflux disease)   . Hypertension   . Personal history of tobacco use, presenting hazards to health 08/17/2015  . Tobacco abuse     Past Surgical History:  Procedure Laterality Date  . ABDOMINAL SURGERY    . hemorrh    . NOSE SURGERY       reports that he has quit smoking. His smoking use included E-cigarettes. He has a 64.50 pack-year smoking history. He has never used smokeless tobacco. He reports that he does not drink alcohol or use drugs.   family history includes CAD in his father.   Outpatient Medications Prior to Visit  Medication Sig Dispense Refill  . Albuterol Sulfate (PROAIR RESPICLICK) 108 (90 BASE) MCG/ACT AEPB Inhale 2 puffs into the lungs every 4 (four) hours as needed (for shortness of breath). Reported on 12/10/2015    . ALPRAZolam (XANAX) 1 MG tablet Take 1 tablet (1 mg total) by mouth 3 (three) times daily as needed for anxiety. 15 tablet 0  . escitalopram (LEXAPRO) 20 MG tablet Take 20 mg by mouth at bedtime.     . gabapentin (NEURONTIN) 600 MG tablet Take 600 mg by mouth 3 (three) times daily.    Marland Kitchen. ipratropium-albuterol (DUONEB)  0.5-2.5 (3) MG/3ML SOLN Take 3 mLs by nebulization 4 (four) times daily as needed (for shortness of breath).    . mometasone-formoterol (DULERA) 200-5 MCG/ACT AERO Inhale 2 puffs into the lungs 2 (two) times daily.    . pseudoephedrine-guaifenesin (MUCINEX D) 60-600 MG 12 hr tablet Take 1 tablet by mouth daily as needed for congestion.     . theophylline (UNIPHYL) 400 MG 24 hr tablet Take 400 mg by mouth 2 (two) times daily.    . Tiotropium Bromide Monohydrate (SPIRIVA RESPIMAT) 2.5 MCG/ACT AERS Inhale 1 puff into the lungs daily.    Marland Kitchen doxazosin (CARDURA) 4 MG tablet Take 4 mg by mouth daily. Reported on 12/10/2015    . predniSONE (DELTASONE) 10 MG tablet Take 1 tablet (10 mg total) by mouth daily with breakfast.  Take 6 tablets today and then decrease by 1 tablet daily until none are left. 21 tablet 0   No facility-administered medications prior to visit.      Allergies: Asa [aspirin]; Codeine; and Ibuprofen    PHYSICAL EXAM: VS:  BP 120/82 (BP Location: Left Arm, Patient Position: Sitting, Cuff Size: Normal)   Pulse (!) 108   Ht 5\' 10"  (1.778 m)   Wt 218 lb 6.4 oz (99.1 kg)   SpO2 95%   BMI 31.34 kg/m  , Body mass index is 31.34 kg/m. Wt Readings from Last 3 Encounters:  05/16/16 218 lb 6.4 oz (99.1 kg)  03/27/16 201 lb 1 oz (91.2 kg)  12/06/15 183 lb 9 oz (83.3 kg)    GENERAL:  well developed, well nourished, obese, not in acute distress HEENT: normocephalic, pink conjunctivae, anicteric sclerae, no xanthelasma, normal dentition, oropharynx clear NECK:  no neck vein engorgement, JVP normal, no hepatojugular reflux, carotid upstroke brisk and symmetric, no bruit, no thyromegaly, no lymphadenopathy LUNGS:  good respiratory effort,  distant breath sounds but clear to auscultation bilaterally CV:  PMI not displaced, no thrills, no lifts, S1 and S2 within normal limits, no palpable S3 or S4, no murmurs, no rubs, no gallops ABD:  Soft, nontender, nondistended, normoactive bowel sounds, no abdominal aortic bruit, no hepatomegaly, no splenomegaly MS: nontender back, no kyphosis, no scoliosis, no joint deformities EXT:  2+ DP/PT pulses, no edema, no varicosities, no cyanosis, no clubbing SKIN: warm, nondiaphoretic, normal turgor, no ulcers NEUROPSYCH: alert, oriented to person, place, and time, sensory/motor grossly intact, normal mood, appropriate affect  Recent Labs: 11/30/2015: TSH 1.790 12/05/2015: Magnesium 2.1 03/25/2016: ALT 18; B Natriuretic Peptide 160.0 03/27/2016: BUN 38; Creatinine, Ser 1.31; Potassium 4.5; Sodium 138 03/28/2016: Hemoglobin 11.4; Platelets 144   Lipid Panel    Component Value Date/Time   CHOL 145 07/02/2015 1327   CHOL 142 09/24/2013 1027   TRIG 61 11/30/2015 1615     TRIG 74 09/24/2013 1027   HDL 50 07/02/2015 1327   HDL 56 09/24/2013 1027   CHOLHDL 2.9 07/02/2015 1327   VLDL 12 07/02/2015 1327   VLDL 15 09/24/2013 1027   LDLCALC 83 07/02/2015 1327   LDLCALC 71 09/24/2013 1027     Other studies Reviewed:  EKG:  The ekg from 05/16/2016 was personally reviewed by me and it revealed sinus tachycardia, 113 BPM. Right bundle branch block. Right bundle branch block as previously noted, 03/27/2016.  Additional studies/ records that were reviewed personally reviewed by me today include:  Echo 04/26/2016: Left ventricle: Systolic function was normal. The estimated   ejection fraction was in the range of 60% to 65%. Wall motion was  normal; there were no regional wall motion abnormalities. Doppler   parameters are consistent with abnormal left ventricular   relaxation (grade 1 diastolic dysfunction). - Aortic valve: Mildly calcified annulus. Normal thickness   leaflets. Valve area (VTI): 2.83 cm^2. Valve area (Vmax): 2.29   cm^2. Valve area (Vmean): 1.84 cm^2. - Atrial septum: No defect or patent foramen ovale was identified. - Pulmonary arteries: Systolic pressure was mildly increased. PA   peak pressure: 35 mm Hg (S). - Technically adequate study.   ASSESSMENT AND PLAN: Shortness of breath He had chest pain in previous hospital admission Right bundle branch block on EKG It was previously recommended that he obtain a pharmacologic nuclear stress test as an outpatient.  Sinus tachycardia CTA in previous hospital admission was negative for PE. This is likely a compensatory response to severe lung disease. PCP prescribed diltiazem. May continue to take for symptomatic relief. Would avoid if it leads to hypertension or to side effects including lightheadedness dizziness.  Chronic Diastolic CHF EF 60-65% by echo in 04/26/2016 No evidence of hypervolemia   Current medicines are reviewed at length with the patient today.  The patient does not  have concerns regarding medicines.  Labs/ tests ordered today include:  Orders Placed This Encounter  Procedures  . NM Myocar Multi W/Spect W/Wall Motion / EF  . EKG 12-Lead    I had a lengthy and detailed discussion with the patient regarding diagnoses, prognosis, diagnostic options, treatment options , and side effects of medications.   I counseled the patient on importance of lifestyle modification including heart healthy diet, regular physical activity .   Disposition:   FU with undersigned after tests   I spent at least 40 minutes with the patient today and more than 50% of the time was spent counseling the patient and coordinating care.   Signed, Almond Lint, MD  05/16/2016 2:48 PM    [facility]  This note was generated in part with voice recognition software and I apologize for any typographical errors that were not detected and corrected.

## 2016-05-15 ENCOUNTER — Other Ambulatory Visit: Payer: Self-pay | Admitting: Licensed Clinical Social Worker

## 2016-05-15 NOTE — Patient Outreach (Signed)
Assessment:  CSW spoke via phone with client on 05/15/16. CSW verified client identity. CSW and client spoke of client needs. Client is eating well and sleeping well. He has his prescribed medications and is taking medications as prescribed. He sees Dr. Welton FlakesKhan as his primary care doctor. [facility] RN Marja KaysAlisa  Gilboy   CSW and client spoke of client care plan. CSW encouraged client to communicate with CSW in next 30 days to discuss community resources and ways these community resources might help client financially at this time.  Client said he had appointment recently with Dr Welton FlakesKhan.  Client said he is scheduled to have sleep study tomorrow night. He said he has appointment with cardiologist tomorrow afternoon.  CSW encouraged client to call RN Marja Kayslisa Gilboy or CSW as needed for [facility]. Client said he is appreciative of nursing support from Marja Kayslisa Gilboy and is appreciative of CSW support with Lorna FewScott Laryn Venning, CSW.CSW thanked Crystal RiverDennis for phone call with CSW on 05/15/16.    Plan:  Client to communicate with CSW in next 30 days to discuss community resources and ways these community resources might help client financially at this time.  CSW to call client in 4 weeks to assess client needs at that time.   Kelton PillarMichael S.Ansen Sayegh MSW, [name] Licensed Clinical Social Worker [facility] Care [facility] [phone]

## 2016-05-16 ENCOUNTER — Ambulatory Visit (INDEPENDENT_AMBULATORY_CARE_PROVIDER_SITE_OTHER): Payer: Medicare Other | Admitting: Cardiology

## 2016-05-16 ENCOUNTER — Encounter: Payer: Self-pay | Admitting: Cardiology

## 2016-05-16 VITALS — BP 120/82 | HR 108 | Ht 70.0 in | Wt 218.4 lb

## 2016-05-16 DIAGNOSIS — I4510 Unspecified right bundle-branch block: Secondary | ICD-10-CM | POA: Diagnosis not present

## 2016-05-16 DIAGNOSIS — R0602 Shortness of breath: Secondary | ICD-10-CM

## 2016-05-16 DIAGNOSIS — I5032 Chronic diastolic (congestive) heart failure: Secondary | ICD-10-CM | POA: Diagnosis not present

## 2016-05-16 DIAGNOSIS — G4733 Obstructive sleep apnea (adult) (pediatric): Secondary | ICD-10-CM | POA: Diagnosis not present

## 2016-05-16 NOTE — Patient Instructions (Addendum)
Testing/Procedures: [facility]  Your caregiver has ordered a Stress Test with nuclear imaging. The purpose of this test is to evaluate the blood supply to your heart muscle. This procedure is referred to as a "Non-Invasive Stress Test." This is because other than having an IV started in your vein, nothing is inserted or "invades" your body. Cardiac stress tests are done to find areas of poor blood flow to the heart by determining the extent of coronary artery disease (CAD). Some patients exercise on a treadmill, which naturally increases the blood flow to your heart, while others who are  unable to walk on a treadmill due to physical limitations have a pharmacologic/chemical stress agent called Lexiscan . This medicine will mimic walking on a treadmill by temporarily increasing your coronary blood flow.   Please note: these test may take anywhere between 2-4 hours to complete  PLEASE REPORT TO [facility]  THE VOLUNTEERS AT THE FIRST DESK WILL DIRECT YOU WHERE TO GO  Date of Procedure:_Tuesday May 23, 2016 at 08:30AM_  Arrival Time for Procedure:__Arrive at 08:15AM to register____  Instructions regarding medication:   __X__ : Hold theophylline for 12 hours prior to procedure.  Take inhalers with you this day.    PLEASE NOTIFY THE OFFICE AT LEAST 24 HOURS IN ADVANCE IF YOU ARE UNABLE TO KEEP YOUR APPOINTMENT.  [phone] AND  PLEASE NOTIFY [facility] AT LEAST 24 HOURS IN ADVANCE IF YOU ARE UNABLE TO KEEP YOUR APPOINTMENT. [phone]  How to prepare for your Myoview test:  1. Do not eat or drink after midnight 2. No caffeine for 24 hours prior to test 3. No smoking 24 hours prior to test. 4. Your medication may be taken with water.  If your doctor stopped a medication because of this test, do not take that medication. 5. Ladies, please do not wear dresses.  Skirts or pants are appropriate. Please wear a short sleeve shirt. 6. No perfume, cologne  or lotion. 7. Wear comfortable walking shoes. No heels!            Follow-Up: Your physician recommends that you schedule a follow-up appointment after testing. Patient wants to see Dr. Mariah MillingGollan.  It was a pleasure seeing you today here in the office. Please do not hesitate to give us a call back if you have any further questions. [phone]  Spivey CellarPamela A. RN, BSN      Pharmacologic Stress Electrocardiogram A pharmacologic stress electrocardiogram is a heart (cardiac) test that uses nuclear imaging to evaluate the blood supply to your heart. This test may also be called a pharmacologic stress electrocardiography. Pharmacologic means that a medicine is used to increase your heart rate and blood pressure.  This stress test is done to find areas of poor blood flow to the heart by determining the extent of coronary artery disease (CAD). Some people exercise on a treadmill, which naturally increases the blood flow to the heart. For those people unable to exercise on a treadmill, a medicine is used. This medicine stimulates your heart and will cause your heart to beat harder and more quickly, as if you were exercising.  Pharmacologic stress tests can help determine:  The adequacy of blood flow to your heart during increased levels of activity in order to clear you for discharge home.  The extent of coronary artery blockage caused by CAD.  Your prognosis if you have suffered a heart attack.  The effectiveness of cardiac procedures done, such as an angioplasty, which  can increase the circulation in your coronary arteries.  Causes of chest pain or pressure. LET [facility] CARE PROVIDER KNOW ABOUT:  Any allergies you have.  All medicines you are taking, including vitamins, herbs, eye drops, creams, and over-the-counter medicines.  Previous problems you or members of your family have had with the use of anesthetics.  Any blood disorders you have.  Previous surgeries you have  had.  Medical conditions you have.  Possibility of pregnancy, if this applies.  If you are currently breastfeeding. RISKS AND COMPLICATIONS Generally, this is a safe procedure. However, as with any procedure, complications can occur. Possible complications include:  You develop pain or pressure in the following areas:  Chest.  Jaw or neck.  Between your shoulder blades.  Radiating down your left arm.  Headache.  Dizziness or light-headedness.  Shortness of breath.  Increased or irregular heartbeat.  Low blood pressure.  Nausea or vomiting.  Flushing.  Redness going up the arm and slight pain during injection of medicine.  Heart attack (rare). BEFORE THE PROCEDURE   Avoid all forms of caffeine for 24 hours before your test or as directed by your health care provider. This includes coffee, tea (even decaffeinated tea), caffeinated sodas, chocolate, cocoa, and certain pain medicines.  Follow your health care provider's instructions regarding eating and drinking before the test.  Take your medicines as directed at regular times with water unless instructed otherwise. Exceptions may include:  If you have diabetes, ask how you are to take your insulin or pills. It is common to adjust insulin dosing the morning of the test.  If you are taking beta-blocker medicines, it is important to talk to your health care provider about these medicines well before the date of your test. Taking beta-blocker medicines may interfere with the test. In some cases, these medicines need to be changed or stopped 24 hours or more before the test.  If you wear a nitroglycerin patch, it may need to be removed prior to the test. Ask your health care provider if the patch should be removed before the test.  If you use an inhaler for any breathing condition, bring it with you to the test.  If you are an outpatient, bring a snack so you can eat right after the stress phase of the test.  Do not  smoke for 4 hours prior to the test or as directed by your health care provider.  Do not apply lotions, powders, creams, or oils on your chest prior to the test.  Wear comfortable shoes and clothing. Let your health care provider know if you were unable to complete or follow the preparations for your test. PROCEDURE   Multiple patches (electrodes) will be put on your chest. If needed, small areas of your chest may be shaved to get better contact with the electrodes. Once the electrodes are attached to your body, multiple wires will be attached to the electrodes, and your heart rate will be monitored.  An IV access will be started. A nuclear trace (isotope) is given. The isotope may be given intravenously, or it may be swallowed. Nuclear refers to several types of radioactive isotopes, and the nuclear isotope lights up the arteries so that the nuclear images are clear. The isotope is absorbed by your body. This results in low radiation exposure.  A resting nuclear image is taken to show how your heart functions at rest.  A medicine is given through the IV access.  A second scan is done  about 1 hour after the medicine injection and determines how your heart functions under stress.  During this stress phase, you will be connected to an electrocardiogram machine. Your blood pressure and oxygen levels will be monitored. AFTER THE PROCEDURE   Your heart rate and blood pressure will be monitored after the test.  You may return to your normal schedule, including diet,activities, and medicines, unless your health care provider tells you otherwise.   This information is not intended to replace advice given to you by your health care provider. Make sure you discuss any questions you have with your health care provider.   Document Released: 01/21/2009 Document Revised: 09/09/2013 Document Reviewed: 05/12/2013 Elsevier Interactive Patient Education [employer]

## 2016-05-17 DIAGNOSIS — I2510 Atherosclerotic heart disease of native coronary artery without angina pectoris: Secondary | ICD-10-CM | POA: Diagnosis not present

## 2016-05-17 DIAGNOSIS — J441 Chronic obstructive pulmonary disease with (acute) exacerbation: Secondary | ICD-10-CM | POA: Diagnosis not present

## 2016-05-17 DIAGNOSIS — Z9981 Dependence on supplemental oxygen: Secondary | ICD-10-CM | POA: Diagnosis not present

## 2016-05-17 DIAGNOSIS — I959 Hypotension, unspecified: Secondary | ICD-10-CM | POA: Diagnosis not present

## 2016-05-17 DIAGNOSIS — Z7951 Long term (current) use of inhaled steroids: Secondary | ICD-10-CM | POA: Diagnosis not present

## 2016-05-17 DIAGNOSIS — J9621 Acute and chronic respiratory failure with hypoxia: Secondary | ICD-10-CM | POA: Diagnosis not present

## 2016-05-17 DIAGNOSIS — I5033 Acute on chronic diastolic (congestive) heart failure: Secondary | ICD-10-CM | POA: Diagnosis not present

## 2016-05-18 ENCOUNTER — Other Ambulatory Visit: Payer: Self-pay | Admitting: *Deleted

## 2016-05-18 DIAGNOSIS — J9611 Chronic respiratory failure with hypoxia: Secondary | ICD-10-CM | POA: Diagnosis not present

## 2016-05-18 DIAGNOSIS — I5033 Acute on chronic diastolic (congestive) heart failure: Secondary | ICD-10-CM | POA: Diagnosis not present

## 2016-05-18 DIAGNOSIS — D649 Anemia, unspecified: Secondary | ICD-10-CM | POA: Diagnosis not present

## 2016-05-18 DIAGNOSIS — J432 Centrilobular emphysema: Secondary | ICD-10-CM | POA: Diagnosis not present

## 2016-05-18 DIAGNOSIS — I10 Essential (primary) hypertension: Secondary | ICD-10-CM | POA: Diagnosis not present

## 2016-05-18 NOTE — Patient Outreach (Signed)
[name]) Care Management  05/18/2016  Dustin CoriaDennis R [name] 1956-08-23 [id number]   I spoke with Dustin [name] by phone today to follow up on his pulmonary and DME needs. He reports feeling pretty well this week although 2 days ago, on his way to an appointment, he says he had to "regroup and get a hold of himself" when he was running late and became anxious and short of breath. He said he stopped and rested and his breathing became easier.   Dustin [name] notes that no changes have been made with regard to his DME and understands that all his diagnostic test results will need to be reviewed prior to any further decisions or orders re: his oxygen delivery system.   He is scheduled for 4 more provider or diagnostic tests over the next 2 weeks and has transportation to all of them.    Plan:  Dustin [name] will call for any new or worsened symptoms related to chronic respiratory disease.   Dustin [name]'s home health nurse will continue weekly visits for pulmonary assessment.   Dustin [name] will attend attend scheduled pulmonary and cardiology appointments and has transportation to both.   I will follow up with Dustin [name] by phone after his upcoming pulmonary/cardiology appointments.   Marja Kayslisa Linzee Depaul MHA,BSN,RN,CCM [facility] Care Management  [phone]

## 2016-05-23 ENCOUNTER — Encounter: Payer: Self-pay | Admitting: Pharmacist

## 2016-05-23 ENCOUNTER — Encounter
Admission: RE | Admit: 2016-05-23 | Discharge: 2016-05-23 | Disposition: A | Discharge status: Home / Self Care | Payer: Medicare Other | Source: Ambulatory Visit | Attending: Cardiology | Admitting: Cardiology

## 2016-05-23 ENCOUNTER — Ambulatory Visit: Payer: Self-pay | Admitting: Pharmacist

## 2016-05-23 DIAGNOSIS — R0602 Shortness of breath: Secondary | ICD-10-CM | POA: Diagnosis not present

## 2016-05-23 DIAGNOSIS — I4510 Unspecified right bundle-branch block: Secondary | ICD-10-CM | POA: Diagnosis not present

## 2016-05-23 DIAGNOSIS — I5032 Chronic diastolic (congestive) heart failure: Secondary | ICD-10-CM

## 2016-05-23 MED ORDER — TECHNETIUM TC 99M TETROFOSMIN IV KIT
27.4800 | PACK | Freq: Once | INTRAVENOUS | Status: AC | PRN
  Administered 2016-05-23: 27.48 via INTRAVENOUS

## 2016-05-23 MED ORDER — TECHNETIUM TC 99M TETROFOSMIN IV KIT
12.8110 | PACK | Freq: Once | INTRAVENOUS | Status: AC | PRN
  Administered 2016-05-23: 12.811 via INTRAVENOUS

## 2016-05-23 MED ORDER — REGADENOSON 0.4 MG/5ML IV SOLN
0.4000 mg | Freq: Once | INTRAVENOUS | Status: AC
  Administered 2016-05-23: 0.4 mg via INTRAVENOUS

## 2016-05-24 ENCOUNTER — Other Ambulatory Visit: Payer: Self-pay | Admitting: Pharmacist

## 2016-05-24 DIAGNOSIS — I5033 Acute on chronic diastolic (congestive) heart failure: Secondary | ICD-10-CM | POA: Diagnosis not present

## 2016-05-24 DIAGNOSIS — Z7951 Long term (current) use of inhaled steroids: Secondary | ICD-10-CM | POA: Diagnosis not present

## 2016-05-24 DIAGNOSIS — J9621 Acute and chronic respiratory failure with hypoxia: Secondary | ICD-10-CM | POA: Diagnosis not present

## 2016-05-24 DIAGNOSIS — I2510 Atherosclerotic heart disease of native coronary artery without angina pectoris: Secondary | ICD-10-CM | POA: Diagnosis not present

## 2016-05-24 DIAGNOSIS — Z9981 Dependence on supplemental oxygen: Secondary | ICD-10-CM | POA: Diagnosis not present

## 2016-05-24 DIAGNOSIS — I959 Hypotension, unspecified: Secondary | ICD-10-CM | POA: Diagnosis not present

## 2016-05-24 DIAGNOSIS — J441 Chronic obstructive pulmonary disease with (acute) exacerbation: Secondary | ICD-10-CM | POA: Diagnosis not present

## 2016-05-24 LAB — NM MYOCAR MULTI W/SPECT W/WALL MOTION / EF
CHL CUP NUCLEAR SDS: 1
CHL CUP STRESS STAGE 1 HR: 71 {beats}/min
CHL CUP STRESS STAGE 2 GRADE: 0 %
CHL CUP STRESS STAGE 3 GRADE: 0 %
CHL CUP STRESS STAGE 4 SBP: 132 mmHg
CHL CUP STRESS STAGE 4 SPEED: 0 mph
CSEPEW: 1 METS
LVDIAVOL: 109 mL (ref 62–150)
LVSYSVOL: 57 mL
NUC STRESS TID: 1.21
Peak HR: 100 {beats}/min
Percent HR: 63 %
Percent of predicted max HR: 63 %
Rest HR: 72 {beats}/min
SRS: 9
SSS: 5
Stage 1 Grade: 0 %
Stage 1 Speed: 0 mph
Stage 2 HR: 71 {beats}/min
Stage 2 Speed: 0 mph
Stage 3 HR: 100 {beats}/min
Stage 3 Speed: 0 mph
Stage 4 DBP: 83 mmHg
Stage 4 Grade: 0 %
Stage 4 HR: 74 {beats}/min

## 2016-05-24 NOTE — Patient Outreach (Signed)
[name]N) Care Management  05/24/2016  Dustin CoriaDennis R [name] 04-11-1956 [id number]  Unsuccessful phone outreach to patient to follow-up on his patient assistance for albuterol inhaler.   No answer, left a HIPAA compliant voicemail requesting a return call.   Plan:  Will make another attempt to reach patient within the next week if no return call from patient.    Tommye StandardKevin Lindsey Demonte, PharmD, [facility] Clinical Pharmacist [name] [phone]

## 2016-05-25 ENCOUNTER — Telehealth: Payer: Self-pay | Admitting: *Deleted

## 2016-05-25 DIAGNOSIS — G4733 Obstructive sleep apnea (adult) (pediatric): Secondary | ICD-10-CM | POA: Diagnosis not present

## 2016-05-25 DIAGNOSIS — J9611 Chronic respiratory failure with hypoxia: Secondary | ICD-10-CM | POA: Diagnosis not present

## 2016-05-25 NOTE — Telephone Encounter (Signed)
Notified patient that short term follow up lung cancer screening low dose CT scan is due. Confirmed that patient is within the age range of 55-77, and asymptomatic, (no signs or symptoms of lung cancer). Patient denies illness that would prevent curative treatment for lung cancer if found. The patient is a former smoker, quit 11/2015 with a 64.5 pack year history. The shared decision making visit was done 08/18/15. Patient is agreeable for CT scan being scheduled.

## 2016-05-26 ENCOUNTER — Other Ambulatory Visit: Payer: Self-pay | Admitting: *Deleted

## 2016-05-26 NOTE — Patient Outreach (Signed)
[name]) Care Management  05/26/2016  Hershal CoriaDennis R Beougher 1955/11/18 [id number]   I spoke with [name] by phone today at length to follow up on his progress related to COPD disease management.   He reports having seen his primary care provider and pulmonologist this past week and his cardiologist on 05/16/16. He says his Cartia XL was discontinued. He had PFT's this week and also had a 2DEcho per his own report that was "normal".   [name] is still quite focused on getting his prescribed O2 flow rate decreased to below 5l/min so that he can have a light weight tank. He currently has a concentrator at home and cylinders to use when he is out. [name] tells me he has "weaned" his O2 down to 2l/[place] when he is at home.   Plan: I will call Dr. Milta DeitersKhan's office on Tuesday to see if I can speak with the nurse so we can collaborate on plans and discuss [name]'s educational needs around O2 delivery.    Marja Kayslisa Gilboy MHA,BSN,RN,CCM [facility] Care Management  [phone]

## 2016-05-29 DIAGNOSIS — I5033 Acute on chronic diastolic (congestive) heart failure: Secondary | ICD-10-CM | POA: Diagnosis not present

## 2016-05-29 DIAGNOSIS — J441 Chronic obstructive pulmonary disease with (acute) exacerbation: Secondary | ICD-10-CM | POA: Diagnosis not present

## 2016-05-29 DIAGNOSIS — I2510 Atherosclerotic heart disease of native coronary artery without angina pectoris: Secondary | ICD-10-CM | POA: Diagnosis not present

## 2016-05-29 DIAGNOSIS — J9621 Acute and chronic respiratory failure with hypoxia: Secondary | ICD-10-CM | POA: Diagnosis not present

## 2016-05-29 DIAGNOSIS — Z7951 Long term (current) use of inhaled steroids: Secondary | ICD-10-CM | POA: Diagnosis not present

## 2016-05-29 DIAGNOSIS — I959 Hypotension, unspecified: Secondary | ICD-10-CM | POA: Diagnosis not present

## 2016-05-29 DIAGNOSIS — Z9981 Dependence on supplemental oxygen: Secondary | ICD-10-CM | POA: Diagnosis not present

## 2016-05-31 ENCOUNTER — Other Ambulatory Visit: Payer: Self-pay | Admitting: *Deleted

## 2016-05-31 ENCOUNTER — Other Ambulatory Visit: Payer: Self-pay | Admitting: Pharmacist

## 2016-05-31 DIAGNOSIS — J449 Chronic obstructive pulmonary disease, unspecified: Secondary | ICD-10-CM | POA: Diagnosis not present

## 2016-05-31 DIAGNOSIS — J471 Bronchiectasis with (acute) exacerbation: Secondary | ICD-10-CM | POA: Diagnosis not present

## 2016-05-31 DIAGNOSIS — G4733 Obstructive sleep apnea (adult) (pediatric): Secondary | ICD-10-CM | POA: Diagnosis not present

## 2016-05-31 DIAGNOSIS — A4902 Methicillin resistant Staphylococcus aureus infection, unspecified site: Secondary | ICD-10-CM | POA: Diagnosis not present

## 2016-05-31 DIAGNOSIS — R918 Other nonspecific abnormal finding of lung field: Secondary | ICD-10-CM

## 2016-05-31 NOTE — Patient Outreach (Signed)
[name]N) Care Management  05/31/2016  Hershal CoriaDennis R Shiffman 17-Dec-1955 [id number]  Second outreach attempt to patient, he answered and verified name/date of birth.   Patient states that he was in between appointments and on his way to get blood work.  Offered to call back at a different time and patient stated tomorrow morning would be better for him.   Plan:  Will make outreach attempt to patient tomorrow morning per his request.   Tommye StandardKevin Curley Fayette, PharmD, [facility] InstituteBCACP Clinical Pharmacist [name] [phone]

## 2016-06-01 ENCOUNTER — Other Ambulatory Visit
Admission: RE | Admit: 2016-06-01 | Discharge: 2016-06-01 | Disposition: A | Discharge status: Home / Self Care | Payer: Medicare Other | Source: Ambulatory Visit | Attending: Physician Assistant | Admitting: Physician Assistant

## 2016-06-01 ENCOUNTER — Other Ambulatory Visit: Payer: Self-pay | Admitting: *Deleted

## 2016-06-01 ENCOUNTER — Encounter: Payer: Self-pay | Admitting: *Deleted

## 2016-06-01 ENCOUNTER — Other Ambulatory Visit: Payer: Self-pay | Admitting: Pharmacist

## 2016-06-01 DIAGNOSIS — E559 Vitamin D deficiency, unspecified: Secondary | ICD-10-CM | POA: Diagnosis not present

## 2016-06-01 DIAGNOSIS — Z125 Encounter for screening for malignant neoplasm of prostate: Secondary | ICD-10-CM | POA: Diagnosis not present

## 2016-06-01 DIAGNOSIS — D649 Anemia, unspecified: Secondary | ICD-10-CM | POA: Diagnosis not present

## 2016-06-01 DIAGNOSIS — Z0001 Encounter for general adult medical examination with abnormal findings: Secondary | ICD-10-CM | POA: Diagnosis not present

## 2016-06-01 LAB — COMPREHENSIVE METABOLIC PANEL
ALT: 13 U/L — AB (ref 17–63)
ANION GAP: 4 — AB (ref 5–15)
AST: 20 U/L (ref 15–41)
Albumin: 4.2 g/dL (ref 3.5–5.0)
Alkaline Phosphatase: 56 U/L (ref 38–126)
BUN: 13 mg/dL (ref 6–20)
CHLORIDE: 103 mmol/L (ref 101–111)
CO2: 37 mmol/L — AB (ref 22–32)
Calcium: 9.2 mg/dL (ref 8.9–10.3)
Creatinine, Ser: 1.07 mg/dL (ref 0.61–1.24)
Glucose, Bld: 97 mg/dL (ref 65–99)
POTASSIUM: 4 mmol/L (ref 3.5–5.1)
SODIUM: 144 mmol/L (ref 135–145)
Total Bilirubin: 0.7 mg/dL (ref 0.3–1.2)
Total Protein: 6.9 g/dL (ref 6.5–8.1)

## 2016-06-01 LAB — CBC WITH DIFFERENTIAL/PLATELET
BASOS ABS: 0 10*3/uL (ref 0–0.1)
BASOS PCT: 0 %
EOS ABS: 0.2 10*3/uL (ref 0–0.7)
EOS PCT: 3 %
HEMATOCRIT: 39.9 % — AB (ref 40.0–52.0)
Hemoglobin: 13.5 g/dL (ref 13.0–18.0)
Lymphocytes Relative: 25 %
Lymphs Abs: 1.8 10*3/uL (ref 1.0–3.6)
MCH: 33 pg (ref 26.0–34.0)
MCHC: 33.9 g/dL (ref 32.0–36.0)
MCV: 97.2 fL (ref 80.0–100.0)
MONO ABS: 0.5 10*3/uL (ref 0.2–1.0)
MONOS PCT: 7 %
NEUTROS ABS: 4.6 10*3/uL (ref 1.4–6.5)
Neutrophils Relative %: 65 %
PLATELETS: 171 10*3/uL (ref 150–440)
RBC: 4.1 MIL/uL — ABNORMAL LOW (ref 4.40–5.90)
RDW: 13.9 % (ref 11.5–14.5)
WBC: 7.2 10*3/uL (ref 3.8–10.6)

## 2016-06-01 LAB — IRON AND TIBC
Iron: 83 ug/dL (ref 45–182)
SATURATION RATIOS: 26 % (ref 17.9–39.5)
TIBC: 317 ug/dL (ref 250–450)
UIBC: 234 ug/dL

## 2016-06-01 LAB — VITAMIN B12: Vitamin B-12: 387 pg/mL (ref 180–914)

## 2016-06-01 LAB — LIPID PANEL
CHOL/HDL RATIO: 2.3 ratio
CHOLESTEROL: 133 mg/dL (ref 0–200)
HDL: 59 mg/dL (ref >40)
LDL CALC: 61 mg/dL (ref 0–99)
TRIGLYCERIDES: 64 mg/dL (ref <150)
VLDL: 13 mg/dL (ref 0–40)

## 2016-06-01 LAB — FOLATE: FOLATE: 26 ng/mL (ref >5.9)

## 2016-06-01 LAB — FERRITIN: Ferritin: 95 ng/mL (ref 24–336)

## 2016-06-01 LAB — PSA: PSA: 0.44 ng/mL (ref 0.00–4.00)

## 2016-06-01 NOTE — Patient Outreach (Signed)
[name]) Care Management  06/01/2016  Dustin CoriaDennis R [name] 04-28-1956 [id number]  Attempted to follow-up via phone with patient this morning per his request yesterday.  No answer, HIPAA compliant voicemail left.    Plan:  If no return call, will make another attempt next week.    Tommye StandardKevin Clarity Ciszek, PharmD, [facility]ACP Clinical Pharmacist [name] [phone]

## 2016-06-01 NOTE — Patient Outreach (Signed)
[name]) Care Management  06/01/2016  Dustin [name] [birth year] [id number]  I reached out to the office of Dr. Beverely RisenFozia Khan to follow up on recent pulmonary testing [name] underwent and to collaborate with the primary care team regarding any future changes in O2 delivery systems. I was unable to each anyone but left a message requesting a return call.   Plan: I will follow up with the office tomorrow if I've not heard from them by 5pm today.    Marja Kayslisa Yoshua Geisinger MHA,BSN,RN,CCM [facility] Care Management  [phone]

## 2016-06-02 ENCOUNTER — Other Ambulatory Visit: Payer: Self-pay | Admitting: *Deleted

## 2016-06-02 LAB — VITAMIN D 25 HYDROXY (VIT D DEFICIENCY, FRACTURES): VIT D 25 HYDROXY: 33.8 ng/mL (ref 30.0–100.0)

## 2016-06-02 LAB — THYROID PANEL WITH TSH
Free Thyroxine Index: 1.4 (ref 1.2–4.9)
T3 Uptake Ratio: 23 % — ABNORMAL LOW (ref 24–39)
T4, Total: 6.2 ug/dL (ref 4.5–12.0)
TSH: 1.58 u[IU]/mL (ref 0.450–4.500)

## 2016-06-02 NOTE — Patient Outreach (Signed)
[name]) Care Management  06/02/2016  Hershal CoriaDennis R Vessey 29-Jan-1956 [id number]  I reached out to the office of Dr. Beverely RisenFozia Khan yesterday to follow up on recent pulmonary testing Mr. Ocanas underwent and to collaborate with the primary care team regarding any future changes in O2 delivery systems. I didn't receive a return call from the office but when I spoke with [name] today he was anticipating delivery of his new O2 delivery system any moment.   Plan: I will reach out to Mr. Berens next week to follow up on DME receipt and COPD disease management.    Marja Kayslisa Anginette Espejo MHA,BSN,RN,CCM [facility] Care Management  [phone]

## 2016-06-06 ENCOUNTER — Ambulatory Visit: Admission: RE | Admit: 2016-06-06 | Payer: Medicare Other | Source: Ambulatory Visit

## 2016-06-06 ENCOUNTER — Ambulatory Visit
Admission: RE | Admit: 2016-06-06 | Discharge: 2016-06-06 | Disposition: A | Discharge status: Home / Self Care | Payer: Medicare Other | Source: Ambulatory Visit | Attending: Oncology | Admitting: Oncology

## 2016-06-06 DIAGNOSIS — Z87891 Personal history of nicotine dependence: Secondary | ICD-10-CM | POA: Insufficient documentation

## 2016-06-06 DIAGNOSIS — R911 Solitary pulmonary nodule: Secondary | ICD-10-CM | POA: Insufficient documentation

## 2016-06-06 DIAGNOSIS — R918 Other nonspecific abnormal finding of lung field: Secondary | ICD-10-CM

## 2016-06-06 DIAGNOSIS — J471 Bronchiectasis with (acute) exacerbation: Secondary | ICD-10-CM | POA: Diagnosis not present

## 2016-06-06 DIAGNOSIS — G4733 Obstructive sleep apnea (adult) (pediatric): Secondary | ICD-10-CM | POA: Diagnosis not present

## 2016-06-06 DIAGNOSIS — R938 Abnormal findings on diagnostic imaging of other specified body structures: Secondary | ICD-10-CM | POA: Diagnosis present

## 2016-06-06 DIAGNOSIS — J449 Chronic obstructive pulmonary disease, unspecified: Secondary | ICD-10-CM | POA: Diagnosis not present

## 2016-06-08 ENCOUNTER — Other Ambulatory Visit: Payer: Self-pay | Admitting: *Deleted

## 2016-06-08 ENCOUNTER — Other Ambulatory Visit: Payer: Self-pay | Admitting: Pharmacist

## 2016-06-08 NOTE — Patient Outreach (Signed)
[name]) Care Management  06/08/2016  Dustin CoriaDennis R [name] 1955-12-05 [id number]  Unsuccessful attempt to follow-up with patient regarding his medications and patient assistance for albuterol inhaler.    Patient's voice mail gave recording that voicemail box was full so was unable to leave a voicemail.   Plan:  Will make another outreach attempt to patient next week.   Tommye StandardKevin Eshan Trupiano, PharmD, [facility]BCACP Clinical Pharmacist [name] [phone]

## 2016-06-08 NOTE — Patient Outreach (Signed)
[facility]) Care Management  06/08/2016  JONAS GOH 04-Aug-1956 [id number]   [name] s is a 59 year old gentleman who was admitted to the hospital from 12/06/15 to 12/10/15 with acute on chronic hypoxic respiratory failure secondary to COPD flare requiring mechanical ventilation. [name] has history of CHF w/ acute episode during that hospital admission.  As a COPD Gold IV patient, [name] was referred to [facility] from the hospital for transition of care services and COPD disease management and care coordination. He was being actively followed in the community for nursing case management, clinical social work, and pharmacy services. Concurrently, [name] was being followed at home by home health nursing through [place]. [name] has been on O2 continuously at home. He lives in his home with his adult children.   The [facility] community team has been following [name] in the home since March 2017. We have provided assistance with nursing, social work, and pharmacy services. As of today, [name] has met all nursing goals and says he feels he no longer is in need of nursing services. He has well established communication with primary care and pulmonary care offices. He knows how to contact his pharmacy and DME provider. He is taking medications as prescribed. He is attending all provider appointments as scheduled. [name] has had only 1 hospital admission in the past 6 months.   Plan: I will close [name] nursing services. At this time, pharmacy and social work continue to follow [name] in the community. [name] has my contact information and has been informed her can contact me at any time in the future should he again be in need of nursing services.    [facility]  [phone]

## 2016-06-09 ENCOUNTER — Ambulatory Visit: Payer: Medicare Other | Admitting: Cardiovascular Disease

## 2016-06-11 ENCOUNTER — Encounter: Payer: Self-pay | Admitting: *Deleted

## 2016-06-14 ENCOUNTER — Encounter: Payer: Self-pay | Admitting: Licensed Clinical Social Worker

## 2016-06-14 ENCOUNTER — Other Ambulatory Visit: Payer: Self-pay | Admitting: Licensed Clinical Social Worker

## 2016-06-14 DIAGNOSIS — I2510 Atherosclerotic heart disease of native coronary artery without angina pectoris: Secondary | ICD-10-CM | POA: Diagnosis not present

## 2016-06-14 DIAGNOSIS — J9611 Chronic respiratory failure with hypoxia: Secondary | ICD-10-CM | POA: Diagnosis not present

## 2016-06-14 DIAGNOSIS — I110 Hypertensive heart disease with heart failure: Secondary | ICD-10-CM | POA: Diagnosis not present

## 2016-06-14 DIAGNOSIS — J449 Chronic obstructive pulmonary disease, unspecified: Secondary | ICD-10-CM | POA: Diagnosis not present

## 2016-06-14 DIAGNOSIS — Z7951 Long term (current) use of inhaled steroids: Secondary | ICD-10-CM | POA: Diagnosis not present

## 2016-06-14 DIAGNOSIS — I5032 Chronic diastolic (congestive) heart failure: Secondary | ICD-10-CM | POA: Diagnosis not present

## 2016-06-14 DIAGNOSIS — Z9981 Dependence on supplemental oxygen: Secondary | ICD-10-CM | POA: Diagnosis not present

## 2016-06-14 NOTE — Patient Outreach (Signed)
Assessment:  CSW spoke via phone with client on 06/14/16. CSW verified client identity. CSW and client spoke of client needs. Client said he had his prescribed medications and is taking medications as prescribed. He said he has no transportation problems at present. He resides at home of his son and daughter in law. [place] services have now been completed for client.  Client is still receiving [facility] pharmacy program support. Client did not mention any pain issues. CSW has talked with Dhairya previously about [employer] application process. CSW has talked with Zyke[place] previously about Medicaid application process. CSW had talked with Simona Huh about community resources for financial assistance for client. CSW informed Onofre on 06/14/16 that client had met care plan goals of client with [facility] CSW services. Thus, CSW informed [place] that [place] would discharge client on 06/14/16 from [place] services only. Client agreed to this plan. He said he did not have any further social work needs at present. Again, client continues to receive [facility] pharmacy program support at this time. CSW congratulated [employer] for meeting his care plan goals with CSW services. CSW thanked [place] for working with [place] and with [facility].    Plan:  CSW is discharging [employer] Giammona on 06/14/16 from [place] services only since client has met care plan goals with CSW services.  CSW to inform Josepha Pigg that [place] discharged client on 06/14/16 from [place] services only.  CSW to fax letter to Dr. Humphrey Rolls informing Dr. Humphrey Rolls that [place] discharged client from [place] services only on 06/14/16.   Norva Riffle.Taniesha Glanz MSW, [name] Licensed Clinical Social Worker [facility] Care Management [phone]

## 2016-06-15 ENCOUNTER — Encounter: Payer: Self-pay | Admitting: *Deleted

## 2016-06-15 ENCOUNTER — Ambulatory Visit: Payer: Self-pay | Admitting: Pharmacist

## 2016-06-15 NOTE — Progress Notes (Signed)
Patient reviewed in multidisiplinary conference and due to extremely slow growth of lung lesion and comorbidity, the recommendation is for 6 month follow up imaging.

## 2016-06-16 ENCOUNTER — Other Ambulatory Visit: Payer: Self-pay | Admitting: Pharmacist

## 2016-06-16 ENCOUNTER — Encounter: Payer: Self-pay | Admitting: Pharmacist

## 2016-06-16 NOTE — Patient Outreach (Signed)
[name]) Care Management  06/16/2016  Hershal CoriaDennis R Corredor 09-09-1956 [id number]  Successful phone outreach to patient, he verified HIPAA details.  Patient reports that he has his medications and denies medication related questions/concerns at this time.    He reports he is still working with his prescriber office for patient assistance on his albuterol inhaler but denies needing [facility] assistance with this.    Patient asked about what happens with patient assistance for [employer] executive([employer]) and [employer]) next year.  Counseled patient that the patient assistance programs typically approve Medicare Part D beneficiaries through the calendar year and they have to re-apply the following year.    Programs may also change the requirements for the following year so patient was counseled to speak with his PCP office come the beginning of 2018 regarding patient assistance.    Plan:  As patient denies other pharmacy related needs, will close pharmacy case.   Patient has [facility] Pharmac[facility] contact information should he have questions later.    Will send case closure letter to PCP.    Tommye StandardKevin Lasean Rahming, PharmD, [facility] HospitalBCACP Clinical Pharmacist [name] [phone]

## 2016-06-21 DIAGNOSIS — I5032 Chronic diastolic (congestive) heart failure: Secondary | ICD-10-CM | POA: Diagnosis not present

## 2016-06-21 DIAGNOSIS — Z7951 Long term (current) use of inhaled steroids: Secondary | ICD-10-CM | POA: Diagnosis not present

## 2016-06-21 DIAGNOSIS — J9611 Chronic respiratory failure with hypoxia: Secondary | ICD-10-CM | POA: Diagnosis not present

## 2016-06-21 DIAGNOSIS — I110 Hypertensive heart disease with heart failure: Secondary | ICD-10-CM | POA: Diagnosis not present

## 2016-06-21 DIAGNOSIS — I2510 Atherosclerotic heart disease of native coronary artery without angina pectoris: Secondary | ICD-10-CM | POA: Diagnosis not present

## 2016-06-21 DIAGNOSIS — Z9981 Dependence on supplemental oxygen: Secondary | ICD-10-CM | POA: Diagnosis not present

## 2016-06-21 DIAGNOSIS — J449 Chronic obstructive pulmonary disease, unspecified: Secondary | ICD-10-CM | POA: Diagnosis not present

## 2016-06-22 ENCOUNTER — Emergency Department: Payer: Medicare Other

## 2016-06-22 ENCOUNTER — Inpatient Hospital Stay
Admission: EM | Admit: 2016-06-22 | Discharge: 2016-06-28 | DRG: 190 | Disposition: A | Discharge status: Skilled Nursing Facility | Payer: Medicare Other | Attending: Internal Medicine | Admitting: Internal Medicine

## 2016-06-22 ENCOUNTER — Other Ambulatory Visit: Payer: Self-pay | Admitting: Physician Assistant

## 2016-06-22 DIAGNOSIS — I5032 Chronic diastolic (congestive) heart failure: Secondary | ICD-10-CM | POA: Diagnosis not present

## 2016-06-22 DIAGNOSIS — R262 Difficulty in walking, not elsewhere classified: Secondary | ICD-10-CM

## 2016-06-22 DIAGNOSIS — I10 Essential (primary) hypertension: Secondary | ICD-10-CM | POA: Diagnosis not present

## 2016-06-22 DIAGNOSIS — J9621 Acute and chronic respiratory failure with hypoxia: Secondary | ICD-10-CM | POA: Diagnosis present

## 2016-06-22 DIAGNOSIS — Z885 Allergy status to narcotic agent status: Secondary | ICD-10-CM

## 2016-06-22 DIAGNOSIS — J441 Chronic obstructive pulmonary disease with (acute) exacerbation: Principal | ICD-10-CM | POA: Diagnosis present

## 2016-06-22 DIAGNOSIS — Z87891 Personal history of nicotine dependence: Secondary | ICD-10-CM | POA: Diagnosis not present

## 2016-06-22 DIAGNOSIS — I110 Hypertensive heart disease with heart failure: Secondary | ICD-10-CM | POA: Diagnosis present

## 2016-06-22 DIAGNOSIS — Z23 Encounter for immunization: Secondary | ICD-10-CM | POA: Diagnosis not present

## 2016-06-22 DIAGNOSIS — G4730 Sleep apnea, unspecified: Secondary | ICD-10-CM | POA: Diagnosis not present

## 2016-06-22 DIAGNOSIS — R05 Cough: Secondary | ICD-10-CM

## 2016-06-22 DIAGNOSIS — Z9989 Dependence on other enabling machines and devices: Secondary | ICD-10-CM | POA: Diagnosis not present

## 2016-06-22 DIAGNOSIS — J9601 Acute respiratory failure with hypoxia: Secondary | ICD-10-CM

## 2016-06-22 DIAGNOSIS — F419 Anxiety disorder, unspecified: Secondary | ICD-10-CM | POA: Diagnosis present

## 2016-06-22 DIAGNOSIS — R0602 Shortness of breath: Secondary | ICD-10-CM | POA: Diagnosis present

## 2016-06-22 DIAGNOSIS — I5033 Acute on chronic diastolic (congestive) heart failure: Secondary | ICD-10-CM | POA: Diagnosis not present

## 2016-06-22 DIAGNOSIS — J189 Pneumonia, unspecified organism: Secondary | ICD-10-CM

## 2016-06-22 DIAGNOSIS — Z9981 Dependence on supplemental oxygen: Secondary | ICD-10-CM | POA: Diagnosis not present

## 2016-06-22 DIAGNOSIS — R059 Cough, unspecified: Secondary | ICD-10-CM

## 2016-06-22 DIAGNOSIS — Z886 Allergy status to analgesic agent status: Secondary | ICD-10-CM

## 2016-06-22 DIAGNOSIS — M6281 Muscle weakness (generalized): Secondary | ICD-10-CM | POA: Diagnosis not present

## 2016-06-22 DIAGNOSIS — Z79899 Other long term (current) drug therapy: Secondary | ICD-10-CM

## 2016-06-22 DIAGNOSIS — E876 Hypokalemia: Secondary | ICD-10-CM | POA: Diagnosis not present

## 2016-06-22 DIAGNOSIS — Z7951 Long term (current) use of inhaled steroids: Secondary | ICD-10-CM | POA: Diagnosis not present

## 2016-06-22 DIAGNOSIS — R000 Tachycardia, unspecified: Secondary | ICD-10-CM | POA: Diagnosis present

## 2016-06-22 DIAGNOSIS — Z8249 Family history of ischemic heart disease and other diseases of the circulatory system: Secondary | ICD-10-CM

## 2016-06-22 DIAGNOSIS — R6889 Other general symptoms and signs: Secondary | ICD-10-CM | POA: Diagnosis not present

## 2016-06-22 LAB — BASIC METABOLIC PANEL
Anion gap: 7 (ref 5–15)
BUN: 9 mg/dL (ref 6–20)
CHLORIDE: 101 mmol/L (ref 101–111)
CO2: 36 mmol/L — ABNORMAL HIGH (ref 22–32)
CREATININE: 1.25 mg/dL — AB (ref 0.61–1.24)
Calcium: 9.1 mg/dL (ref 8.9–10.3)
Glucose, Bld: 118 mg/dL — ABNORMAL HIGH (ref 65–99)
POTASSIUM: 2.9 mmol/L — AB (ref 3.5–5.1)
SODIUM: 144 mmol/L (ref 135–145)

## 2016-06-22 LAB — CBC
HEMATOCRIT: 38.9 % — AB (ref 40.0–52.0)
Hemoglobin: 13.1 g/dL (ref 13.0–18.0)
MCH: 32.7 pg (ref 26.0–34.0)
MCHC: 33.6 g/dL (ref 32.0–36.0)
MCV: 97.4 fL (ref 80.0–100.0)
PLATELETS: 268 10*3/uL (ref 150–440)
RBC: 3.99 MIL/uL — AB (ref 4.40–5.90)
RDW: 13.4 % (ref 11.5–14.5)
WBC: 10.8 10*3/uL — AB (ref 3.8–10.6)

## 2016-06-22 LAB — BRAIN NATRIURETIC PEPTIDE: B NATRIURETIC PEPTIDE 5: 369 pg/mL — AB (ref 0.0–100.0)

## 2016-06-22 LAB — TROPONIN I
TROPONIN I: 0.04 ng/mL — AB (ref <0.03)
Troponin I: 0.03 ng/mL (ref <0.03)

## 2016-06-22 MED ORDER — ALPRAZOLAM 0.5 MG PO TABS
ORAL_TABLET | ORAL | Status: AC
  Administered 2016-06-22: 1 mg via ORAL
  Filled 2016-06-22: qty 2

## 2016-06-22 MED ORDER — ALPRAZOLAM 0.5 MG PO TABS
1.0000 mg | ORAL_TABLET | Freq: Once | ORAL | Status: AC
  Administered 2016-06-22: 1 mg via ORAL

## 2016-06-22 MED ORDER — IPRATROPIUM-ALBUTEROL 0.5-2.5 (3) MG/3ML IN SOLN
3.0000 mL | Freq: Once | RESPIRATORY_TRACT | Status: AC
  Administered 2016-06-22: 3 mL via RESPIRATORY_TRACT
  Filled 2016-06-22: qty 3

## 2016-06-22 MED ORDER — METHYLPREDNISOLONE SODIUM SUCC 125 MG IJ SOLR
125.0000 mg | Freq: Once | INTRAMUSCULAR | Status: AC
  Administered 2016-06-22: 125 mg via INTRAVENOUS
  Filled 2016-06-22: qty 2

## 2016-06-22 MED ORDER — ALBUTEROL SULFATE (2.5 MG/3ML) 0.083% IN NEBU
10.0000 mg/h | INHALATION_SOLUTION | Freq: Once | RESPIRATORY_TRACT | Status: AC
  Administered 2016-06-22: 10 mg/h via RESPIRATORY_TRACT

## 2016-06-22 MED ORDER — POTASSIUM CHLORIDE 10 MEQ/100ML IV SOLN
10.0000 meq | Freq: Once | INTRAVENOUS | Status: AC
Start: 2016-06-22 — End: 2016-06-22
  Administered 2016-06-22: 10 meq via INTRAVENOUS
  Filled 2016-06-22: qty 100

## 2016-06-22 MED ORDER — ALBUTEROL SULFATE (2.5 MG/3ML) 0.083% IN NEBU
INHALATION_SOLUTION | RESPIRATORY_TRACT | Status: AC
  Administered 2016-06-22: 5 mg via RESPIRATORY_TRACT
  Filled 2016-06-22: qty 3

## 2016-06-22 MED ORDER — AMOXICILLIN-POT CLAVULANATE 875-125 MG PO TABS
1.0000 | ORAL_TABLET | Freq: Once | ORAL | Status: AC
  Administered 2016-06-22: 1 via ORAL
  Filled 2016-06-22: qty 1

## 2016-06-22 MED ORDER — POTASSIUM CHLORIDE CRYS ER 20 MEQ PO TBCR
40.0000 meq | EXTENDED_RELEASE_TABLET | Freq: Once | ORAL | Status: AC
  Administered 2016-06-22: 40 meq via ORAL
  Filled 2016-06-22: qty 2

## 2016-06-22 MED ORDER — IPRATROPIUM-ALBUTEROL 0.5-2.5 (3) MG/3ML IN SOLN
3.0000 mL | Freq: Once | RESPIRATORY_TRACT | Status: AC
  Administered 2016-06-22: 3 mL via RESPIRATORY_TRACT

## 2016-06-22 MED ORDER — ALBUTEROL SULFATE (2.5 MG/3ML) 0.083% IN NEBU
5.0000 mg | INHALATION_SOLUTION | Freq: Once | RESPIRATORY_TRACT | Status: DC
  Administered 2016-06-22: 5 mg via RESPIRATORY_TRACT

## 2016-06-22 NOTE — ED Notes (Signed)
Family updated to pt location

## 2016-06-22 NOTE — ED Notes (Signed)
Pt reports increased diff breathing but feels like it is related to his anxiety, reports he usually takes 1mg  xanax. EDP made aware and VORB received

## 2016-06-22 NOTE — ED Notes (Signed)
Respiratory called to place pt on Bip[facility] ? ?

## 2016-06-22 NOTE — ED Triage Notes (Signed)
Pt reports to ED w/ c/o cough x 1.5 weeks.  Pt sts that he has finished z-pack, but has been coughing up "brown stuff" since Sunday.  Pt c/o incr SOB x 3-4 days.  Denies CP, n/v/d or falls.  Pt sts he ran fever last week.

## 2016-06-22 NOTE — ED Notes (Signed)
Pt noted to be desatting to 86% on 4L, placed on 6L at this time and EDP made aware

## 2016-06-22 NOTE — H&P (Signed)
[facility] - [place] at [facility]   PATIENT NAME: Dustin [name]    MR#:  [id number]  DATE OF BIRTH:  02-04-1956  DATE OF ADMISSION:  06/22/2016  PRIMARY CARE PHYSICIAN: Lyndon Code, MD   REQUESTING/REFERRING PHYSICIAN: Don Perking, MD  CHIEF COMPLAINT:   Chief Complaint  Patient presents with  . Cough  . Shortness of Breath    HISTORY OF PRESENT ILLNESS:  Dustin [name]  is a 60 y.o. male who presents with Her aggressively worsening shortness of breath and productive cough. Patient has a known history of COPD, and was treated in the outpatient setting recently with a course of azithromycin. Despite this his symptoms have progressed and he came in today with mild respiratory distress. Here he was found to be hypoxic, and has since been started on BiPAP. Chest x-ray did not show any significant pneumonia or heart failure. He was given initial treatment for COPD exacerbation and hospitals were called for admission.  PAST MEDICAL HISTORY:   Past Medical History:  Diagnosis Date  . Allergy   . Anxiety   . Anxiety   . Asthma   . Chronic diastolic CHF (congestive heart failure) (HCC)    a. echo 07/2013: EF 60-65%, DD, biatrial dilatation, Ao sclerosis, dilated RV, moderate pulmonary HTN, elevated CV and RA pressures; b. patient reported echo at Dr. Milta Deiters office 02/2015 - his office does not have record of him being a pt there c. echo 11/2015: EF 60-65%, Grade 1 DD, mod-severe pulm pressures  . Chronic respiratory failure (HCC)    a. on 2L via nasal cannula; b. secondary to COPD  . COPD (chronic obstructive pulmonary disease) (HCC)   . Depression   . Depression   . Depression   . Emphysema of lung (HCC)   . GERD (gastroesophageal reflux disease)   . Hypertension   . Personal history of tobacco use, presenting hazards to health 08/17/2015  . Tobacco abuse     PAST SURGICAL HISTORY:   Past Surgical History:  Procedure Laterality Date  . ABDOMINAL  SURGERY    . ADENOIDECTOMY    . hemorrh    . NOSE SURGERY    . URETHRA SURGERY     surgery 6 times from age [age] yrs old    SOCIAL HISTORY:   Social History  Substance Use Topics  . Smoking status: Former Smoker    Packs/day: 1.50    Years: 43.00    Types: E-cigarettes  . Smokeless tobacco: Never Used  . Alcohol use No    FAMILY HISTORY:   Family History  Problem Relation Age of Onset  . CAD Father   . Diabetes Neg Hx     DRUG ALLERGIES:   Allergies  Allergen Reactions  . Asa [Aspirin] Other (See Comments)    Reaction: swelling of the right side.  . Codeine Other (See Comments)    Reaction: Difficulty breathing  . Ibuprofen Other (See Comments)    Reaction: Swelling of the right side.    MEDICATIONS AT HOME:   Prior to Admission medications   Medication Sig Start Date End Date Taking? Authorizing Provider  Albuterol Sulfate (PROAIR RESPICLICK) 108 (90 BASE) MCG/ACT AEPB Inhale 2 puffs into the lungs every 4 (four) hours as needed (for shortness of breath). Reported on 12/10/2015   Yes Historical Provider, MD  ALPRAZolam Prudy Feeler) 1 MG tablet Take 1 tablet (1 mg total) by mouth 3 (three) times daily as needed for anxiety. 12/06/15  Yes Enedina Finner,  MD  escitalopram (LEXAPRO) 20 MG tablet Take 20 mg by mouth at bedtime.    Yes Historical Provider, MD  furosemide (LASIX) 20 MG tablet Take 20 mg by mouth.   Yes Historical Provider, MD  gabapentin (NEURONTIN) 600 MG tablet Take 600 mg by mouth 3 (three) times daily.   Yes Historical Provider, MD  ipratropium-albuterol (DUONEB) 0.5-2.5 (3) MG/3ML SOLN Take 3 mLs by nebulization 4 (four) times daily as needed (for shortness of breath).   Yes Historical Provider, MD  mometasone-formoterol (DULERA) 200-5 MCG/ACT AERO Inhale 1 puff into the lungs 2 (two) times daily.    Yes Historical Provider, MD  potassium chloride (K-DUR,KLOR-CON) 10 MEQ tablet Take 10 mEq by mouth once.   Yes Historical Provider, MD  theophylline (UNIPHYL) 400  MG 24 hr tablet Take 400 mg by mouth 2 (two) times daily.   Yes Historical Provider, MD  Tiotropium Bromide Monohydrate (SPIRIVA RESPIMAT) 2.5 MCG/ACT AERS Inhale 2 puffs into the lungs daily.    Yes Historical Provider, MD    REVIEW OF SYSTEMS:  Review of Systems  Constitutional: Negative for chills, fever, malaise/fatigue and weight loss.  HENT: Negative for ear pain, hearing loss and tinnitus.   Eyes: Negative for blurred vision, double vision, pain and redness.  Respiratory: Positive for cough, sputum production, shortness of breath and wheezing. Negative for hemoptysis.   Cardiovascular: Negative for chest pain, palpitations, orthopnea and leg swelling.  Gastrointestinal: Negative for abdominal pain, constipation, diarrhea, nausea and vomiting.  Genitourinary: Negative for dysuria, frequency and hematuria.  Musculoskeletal: Negative for back pain, joint pain and neck pain.  Skin:       No acne, rash, or lesions  Neurological: Negative for dizziness, tremors, focal weakness and weakness.  Endo/Heme/Allergies: Negative for polydipsia. Does not bruise/bleed easily.  Psychiatric/Behavioral: Negative for depression. The patient is not nervous/anxious and does not have insomnia.      VITAL SIGNS:   Vitals:   06/22/16 1749 06/22/16 1900 06/22/16 2030 06/22/16 2100  BP: 119/77 117/83 136/79 124/76  Pulse: (!) 109 (!) 121 (!) 124 (!) 122  Resp: 18 (!) 23 (!) 30 (!) 37  Temp:      TempSrc:      SpO2: 96% 90% (!) 89% 92%  Weight:      Height:       Wt Readings from Last 3 Encounters:  06/22/16 96.2 kg (212 lb)  05/16/16 99.1 kg (218 lb 6.4 oz)  03/27/16 91.2 kg (201 lb 1 oz)    PHYSICAL EXAMINATION:  Physical Exam  Vitals reviewed. Constitutional: He is oriented to person, place, and time. He appears well-developed and well-nourished. No distress.  HENT:  Head: Normocephalic and atraumatic.  Mouth/Throat: Oropharynx is clear and moist.  Eyes: Conjunctivae and EOM are normal.  Pupils are equal, round, and reactive to light. No scleral icterus.  Neck: Normal range of motion. Neck supple. No JVD present. No thyromegaly present.  Cardiovascular: Normal rate, regular rhythm and intact distal pulses.  Exam reveals no gallop and no friction rub.   No murmur heard. Respiratory: He is in respiratory distress. He has wheezes. He has no rales.  Mild expiratory wheezes, but very poor air movement throughout  GI: Soft. Bowel sounds are normal. He exhibits no distension. There is no tenderness.  Musculoskeletal: Normal range of motion. He exhibits no edema.  No arthritis, no gout  Lymphadenopathy:    He has no cervical adenopathy.  Neurological: He is alert and oriented to person, place, and  time. No cranial nerve deficit.  No dysarthria, no aphasia  Skin: Skin is warm and dry. No rash noted. No erythema.  Psychiatric: He has a normal mood and affect. His behavior is normal. Judgment and thought content normal.    LABORATORY PANEL:   CBC  Recent Labs Lab 06/22/16 1557  WBC 10.8*  HGB 13.1  HCT 38.9*  PLT 268   ------------------------------------------------------------------------------------------------------------------  Chemistries   Recent Labs Lab 06/22/16 1557  NA 144  K 2.9*  CL 101  CO2 36*  GLUCOSE 118*  BUN 9  CREATININE 1.25*  CALCIUM 9.1   ------------------------------------------------------------------------------------------------------------------  Cardiac Enzymes  Recent Labs Lab 06/22/16 1828  TROPONINI 0.04*   ------------------------------------------------------------------------------------------------------------------  RADIOLOGY:  Dg Chest 2 View  Result Date: 06/22/2016 CLINICAL DATA:  Cough for 1.5 weeks. Increasing dyspnea for 3-4 days. EXAM: CHEST  2 VIEW COMPARISON:  03/30/2016 and chest CT of 06/05/2016 (low dose) and CT PA 11/30/2015 FINDINGS: Bullous emphysema in the upper lobes, right greater than left with  crowding of lower lobe interstitial lung markings. Patchy airspace opacities at the right lung base have the appearance of atelectasis but superimposed pneumonic infiltrate is not entirely excluded. Pulmonary nodule in the right lower lobe laterally seen on recent low-dose CT scan is not apparent apparent radiographically likely due to overlap with the atelectatic and possibly pneumonic airspace opacities currently noted. The heart is mildly enlarged. The aorta is not aneurysmal. Numerous surgical clips are seen in the left upper quadrant of the abdomen. 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 No acute osseous abnormality. 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 Right lower lobe atelectasis and/or infiltrate. Mild cardiomegaly. Electronically Signed   By: Tollie Eth M.D.   On: 06/22/2016 16:57    EKG:   Orders placed or performed during the hospital encounter of 06/22/16  . ED EKG  . ED EKG    IMPRESSION AND PLAN:  Principal Problem:   COPD exacerbation (HCC) - IV steroids, Nebs, given that he just finished a course of azithromycin and still has productive sputum with a color change from green to brown we will initiate treatment with Levaquin. Patient states that he uses CPAP at night at home, so we will continue BiPAP overnight tonight. Active Problems:   Acute on chronic diastolic CHF (congestive heart failure) (HCC) - chest x-ray was not suggestive of heart failure changes, but his BNP was elevated. We will use Xopenex for his nebulizer treatments given his tachycardia, to hopefully prevent persistent tachycardia which could exacerbate his heart failure. We will continue his home doses of heart failure medications   Anxiety - home dose anxiolytics   HTN (hypertension) - continue home meds  All the records are reviewed and case discussed with ED provider. Management plans discussed with the patient and/or family.  DVT PROPHYLAXIS: SubQ lovenox  GI PROPHYLAXIS:  None  ADMISSION STATUS: Inpatient  CODE STATUS: Full Code Status History    Date Active Date Inactive Code Status Order ID Comments User Context   03/25/2016  5:03 PM 03/31/2016 11:08 PM Full Code [id number]  Standley Brooking, MD Inpatient   11/30/2015  5:12 AM 12/06/2015  9:34 PM Full Code [id number]  Arnaldo Natal, MD Inpatient   04/13/2015  5:22 PM 04/21/2015  7:40 PM Full Code [id number]  Auburn Bilberry, MD Inpatient   03/31/2015  8:50 PM 04/05/2015  8:48 PM Full Code [id number]  Wyatt Haste, MD ED      TOTAL TIME TAKING CARE OF  THIS PATIENT: 45 minutes.    Seraphim Trow FIELDING 06/22/2016, 9:25 PM  [facility]  Office  [phone]  CC: Primary care physician; Lyndon CodeKHAN, FOZIA M, MD

## 2016-06-22 NOTE — ED Provider Notes (Signed)
[facility] Emergency Department Provider Note  ____________________________________________  Time seen: Approximately 6:14 PM  I have reviewed the triage vital signs and the nursing notes.   HISTORY  Chief Complaint Cough and Shortness of Breath   HPI Dustin CoriaDennis R [name] is a 60 y.o. male with a history of asthma, COPD on 2 L [place], COPD, CHF (EF of 60-65% on 11/2015), OSA on CPAP who presents for evaluation of a cough, shortness of breath, and chills. Patient reports two weeks ago he was exposed to 2 grand kids with URI symptoms. He was seen by his primary care doctor initially with green sputum and was treated with a Z-Pak. The Z-Pak finished 9 days ago and patient reports for the last week the cough has become productive of brown sputum. He endorses chills, wheezing, progressively worsening shortness of breath with minimal improvement on albuterol. He is on Lasix when necessary and has been taking 20 mg daily for the last week. He does sleep on 3 pillows but that is chronic. He is unsure if his gain weight. Tmax at home 100F. NO chest pain, vomiting, diarrhea, abdominal pain.  Past Medical History:  Diagnosis Date  . Allergy   . Anxiety   . Anxiety   . Asthma   . Chronic diastolic CHF (congestive heart failure) (HCC)    a. echo 07/2013: EF 60-65%, DD, biatrial dilatation, Ao sclerosis, dilated RV, moderate pulmonary HTN, elevated CV and RA pressures; b. patient reported echo at Dr. Milta DeitersKhan's office 02/2015 - his office does not have record of him being a pt there c. echo 11/2015: EF 60-65%, Grade 1 DD, mod-severe pulm pressures  . Chronic respiratory failure (HCC)    a. on 2L via nasal cannula; b. secondary to COPD  . COPD (chronic obstructive pulmonary disease) (HCC)   . Depression   . Depression   . Depression   . Emphysema of lung (HCC)   . GERD (gastroesophageal reflux disease)   . Hypertension   . Personal history of tobacco use, presenting hazards to health  08/17/2015  . Tobacco abuse     Patient Active Problem List   Diagnosis Date Noted  . Acute respiratory failure with hypoxia (HCC) 03/25/2016  . Tobacco use disorder 03/25/2016  . Acute on chronic respiratory failure with hypercapnia (HCC)   . Endotracheally intubated   . Chest pain with low risk of acute coronary syndrome 11/30/2015  . Chronic diastolic heart failure (HCC) 11/30/2015  . Sinus tachycardia seen on cardiac monitor 11/30/2015  . Hypercapnic respiratory failure (HCC) 11/30/2015  . Pulmonary hypertension   . Centrilobular emphysema (HCC)   . Lethargy   . Personal history of tobacco use, presenting hazards to health 08/17/2015  . Pressure ulcer 04/19/2015  . Acute on chronic diastolic CHF (congestive heart failure) (HCC)   . COPD exacerbation (HCC) 03/31/2015    Past Surgical History:  Procedure Laterality Date  . ABDOMINAL SURGERY    . ADENOIDECTOMY    . hemorrh    . NOSE SURGERY    . URETHRA SURGERY     surgery 6 times from age [age] yrs old    Prior to Admission medications   Medication Sig Start Date End Date Taking? Authorizing Provider  Albuterol Sulfate (PROAIR RESPICLICK) 108 (90 BASE) MCG/ACT AEPB Inhale 2 puffs into the lungs every 4 (four) hours as needed (for shortness of breath). Reported on 12/10/2015   Yes Historical Provider, MD  ALPRAZolam Prudy Feeler(XANAX) 1 MG tablet Take 1 tablet (1  mg total) by mouth 3 (three) times daily as needed for anxiety. 12/06/15  Yes Enedina Finner, MD  escitalopram (LEXAPRO) 20 MG tablet Take 20 mg by mouth at bedtime.    Yes Historical Provider, MD  furosemide (LASIX) 20 MG tablet Take 20 mg by mouth.   Yes Historical Provider, MD  gabapentin (NEURONTIN) 600 MG tablet Take 600 mg by mouth 3 (three) times daily.   Yes Historical Provider, MD  ipratropium-albuterol (DUONEB) 0.5-2.5 (3) MG/3ML SOLN Take 3 mLs by nebulization 4 (four) times daily as needed (for shortness of breath).   Yes Historical Provider, MD  mometasone-formoterol  (DULERA) 200-5 MCG/ACT AERO Inhale 1 puff into the lungs 2 (two) times daily.    Yes Historical Provider, MD  potassium chloride (K-DUR,KLOR-CON) 10 MEQ tablet Take 10 mEq by mouth once.   Yes Historical Provider, MD  pseudoephedrine-guaifenesin (MUCINEX D) 60-600 MG 12 hr tablet Take 1 tablet by mouth daily as needed for congestion.    Yes Historical Provider, MD  theophylline (UNIPHYL) 400 MG 24 hr tablet Take 400 mg by mouth 2 (two) times daily.   Yes Historical Provider, MD  Tiotropium Bromide Monohydrate (SPIRIVA RESPIMAT) 2.5 MCG/ACT AERS Inhale 2 puffs into the lungs daily.    Yes Historical Provider, MD    Allergies Asa [aspirin]; Codeine; and Ibuprofen  Family History  Problem Relation Age of Onset  . CAD Father   . Diabetes Neg Hx     Social History Social History  Substance Use Topics  . Smoking status: Former Smoker    Packs/day: 1.50    Years: 43.00    Types: E-cigarettes  . Smokeless tobacco: Never Used  . Alcohol use No    Review of Systems  Constitutional: Negative for fever. + chilss Eyes: Negative for visual changes. ENT: Negative for sore throat. Cardiovascular: Negative for chest pain. Respiratory: + shortness of breath and wheezing, productive cough Gastrointestinal: Negative for abdominal pain, vomiting or diarrhea. Genitourinary: Negative for dysuria. Musculoskeletal: Negative for back pain. Skin: Negative for rash. Neurological: Negative for headaches, weakness or numbness.  ____________________________________________   PHYSICAL EXAM:  VITAL SIGNS: ED Triage Vitals  Enc Vitals Group     BP 06/22/16 1550 124/72     Pulse Rate 06/22/16 1550 (!) 118     Resp 06/22/16 1550 20     Temp 06/22/16 1550 97.9 F (36.6 C)     Temp Source 06/22/16 1550 Oral     SpO2 06/22/16 1550 92 %     Weight 06/22/16 1550 212 lb (96.2 kg)     Height 06/22/16 1550 5\' 10"  (1.778 m)     Head Circumference --      Peak Flow --      Pain Score 06/22/16 1551 0      Pain Loc --      Pain Edu? --      Excl. in GC? --     Constitutional: Alert and oriented. Well appearing and in no apparent distress. HEENT:      Head: Normocephalic and atraumatic.         Eyes: Conjunctivae are normal. Sclera is non-icteric. EOMI. PERRL      Mouth/Throat: Mucous membranes are moist.       Neck: Supple with no signs of meningismus. Cardiovascular: Tachycardia with regular rhythm. No murmurs, gallops, or rubs. 2+ symmetrical distal pulses are present in all extremities. No JVD. Respiratory: Normal respiratory effort. Severely diminished air movement with faint expiratory wheezes  Gastrointestinal: Soft,  non tender, and non distended with positive bowel sounds. No rebound or guarding. Musculoskeletal: trace pitting edema b/l Neurologic: Normal speech and language. Face is symmetric. Moving all extremities. No gross focal neurologic deficits are appreciated. Skin: Skin is warm, dry and intact. No rash noted. Psychiatric: Mood and affect are normal. Speech and behavior are normal.  ____________________________________________   LABS (all labs ordered are listed, but only abnormal results are displayed)  Labs Reviewed  BASIC METABOLIC PANEL - Abnormal; Notable for the following:       Result Value   Potassium 2.9 (*)    CO2 36 (*)    Glucose, Bld 118 (*)    Creatinine, Ser 1.25 (*)    All other components within normal limits  CBC - Abnormal; Notable for the following:    WBC 10.8 (*)    RBC 3.99 (*)    HCT 38.9 (*)    All other components within normal limits  TROPONIN I - Abnormal; Notable for the following:    Troponin I 0.03 (*)    All other components within normal limits  TROPONIN I - Abnormal; Notable for the following:    Troponin I 0.04 (*)    All other components within normal limits  BRAIN NATRIURETIC PEPTIDE - Abnormal; Notable for the following:    B Natriuretic Peptide 369.0 (*)    All other components within normal limits    ____________________________________________  EKG  ED ECG REPORT I, Nita Sickle, the attending physician, personally viewed and interpreted this ECG.  Sinus tachycardia, rate of 120, right bundle branch block, normal QTC, left axis deviation, no STE. Unchanged from prior ____________________________________________  RADIOLOGY  CXR: Bullous emphysema in the upper lobes. Right lower lobe atelectasis and/or infiltrate. Mild cardiomegaly.  ____________________________________________   PROCEDURES  Procedure(s) performed: None Procedures Critical Care performed:  Yes  CRITICAL CARE Performed by: Nita Sickle  ?  Total critical care time: 35 min  Critical care time was exclusive of separately billable procedures and treating other patients.  Critical care was necessary to treat or prevent imminent or life-threatening deterioration.  Critical care was time spent personally by me on the following activities: development of treatment plan with patient and/or surrogate as well as nursing, discussions with consultants, evaluation of patient's response to treatment, examination of patient, obtaining history from patient or surrogate, ordering and performing treatments and interventions, ordering and review of laboratory studies, ordering and review of radiographic studies, pulse oximetry and re-evaluation of patient's condition.  ____________________________________________   INITIAL IMPRESSION / ASSESSMENT AND PLAN / ED COURSE  60 y.o. male with a history of asthma, C on 2 L [place], COPD, CHF (EF of 60-65% on 11/2015), OSA on CPAP who presents for evaluation of a cough, shortness of breath, and chills x 10 days. Symptoms worsened after finishing the Z-Pak 9 days ago. Patient here is afebrile, satting 95% on 2 L which is his baseline. He has severely diminished air movement bilaterally with faint expiratory wheezes and trace lower extremity edema. Presentation concerning for an  expectorant of COPD with possible CHF exacerbation. We'll give 3 DuoNeb treatments, Solu-Medrol, Augmentin. We'll get a chest x-ray, we'll cycle cardiac markers and get a BNP.  Clinical Course  Comment By Time  Patient sats drop to 88% on 4L. Currently on 6L [place] sating 91%, continues to be severely diminished air movement. Will place patient on BiPAP and give 10mg  of albuterol.  Hypokalemic, supplemented. Will admit to hospitalist Nita Sickle, MD 10/05 2047  Pertinent labs & imaging results that were available during my care of the patient were reviewed by me and considered in my medical decision making (see chart for details).    ____________________________________________   FINAL CLINICAL IMPRESSION(S) / ED DIAGNOSES  Final diagnoses:  COPD exacerbation (HCC)  Acute respiratory failure with hypoxia (HCC)      NEW MEDICATIONS STARTED DURING THIS VISIT:  New Prescriptions   No medications on file     Note:  This document was prepared using Dragon voice recognition software and may include unintentional dictation errors.    Nita Sickle, MD 06/22/16 2106

## 2016-06-23 LAB — BASIC METABOLIC PANEL
Anion gap: 5 (ref 5–15)
BUN: 11 mg/dL (ref 6–20)
CALCIUM: 9 mg/dL (ref 8.9–10.3)
CO2: 34 mmol/L — AB (ref 22–32)
CREATININE: 1.17 mg/dL (ref 0.61–1.24)
Chloride: 104 mmol/L (ref 101–111)
GFR calc non Af Amer: >60 mL/min (ref >60)
GLUCOSE: 158 mg/dL — AB (ref 65–99)
Potassium: 4.9 mmol/L (ref 3.5–5.1)
Sodium: 143 mmol/L (ref 135–145)

## 2016-06-23 LAB — CBC
HCT: 37.7 % — ABNORMAL LOW (ref 40.0–52.0)
Hemoglobin: 12.6 g/dL — ABNORMAL LOW (ref 13.0–18.0)
MCH: 32.8 pg (ref 26.0–34.0)
MCHC: 33.4 g/dL (ref 32.0–36.0)
MCV: 98.4 fL (ref 80.0–100.0)
PLATELETS: 276 10*3/uL (ref 150–440)
RBC: 3.83 MIL/uL — ABNORMAL LOW (ref 4.40–5.90)
RDW: 13.6 % (ref 11.5–14.5)
WBC: 8.9 10*3/uL (ref 3.8–10.6)

## 2016-06-23 LAB — TROPONIN I
TROPONIN I: 0.03 ng/mL — AB (ref <0.03)
Troponin I: <0.03 ng/mL (ref <0.03)

## 2016-06-23 LAB — MRSA PCR SCREENING: MRSA by PCR: NEGATIVE

## 2016-06-23 LAB — GLUCOSE, CAPILLARY: Glucose-Capillary: 157 mg/dL — ABNORMAL HIGH (ref 65–99)

## 2016-06-23 MED ORDER — ACETAMINOPHEN 650 MG RE SUPP: 650.0000 mg | Freq: Four times a day (QID) | RECTAL | Status: DC | PRN

## 2016-06-23 MED ORDER — ALPRAZOLAM 1 MG PO TABS
1.0000 mg | ORAL_TABLET | Freq: Three times a day (TID) | ORAL | Status: DC | PRN
  Administered 2016-06-23 – 2016-06-28 (×14): 1 mg via ORAL
  Filled 2016-06-23 (×14): qty 1

## 2016-06-23 MED ORDER — GABAPENTIN 600 MG PO TABS
600.0000 mg | ORAL_TABLET | Freq: Three times a day (TID) | ORAL | Status: DC
  Administered 2016-06-23 – 2016-06-28 (×17): 600 mg via ORAL
  Filled 2016-06-23 (×18): qty 1

## 2016-06-23 MED ORDER — METHYLPREDNISOLONE SODIUM SUCC 125 MG IJ SOLR
60.0000 mg | Freq: Four times a day (QID) | INTRAMUSCULAR | Status: DC
  Administered 2016-06-23 – 2016-06-26 (×15): 60 mg via INTRAVENOUS
  Filled 2016-06-23 (×13): qty 2

## 2016-06-23 MED ORDER — SODIUM CHLORIDE 0.9% FLUSH
3.0000 mL | Freq: Two times a day (BID) | INTRAVENOUS | Status: DC
  Administered 2016-06-23 – 2016-06-28 (×13): 3 mL via INTRAVENOUS

## 2016-06-23 MED ORDER — ONDANSETRON HCL 4 MG/2ML IJ SOLN: 4.0000 mg | Freq: Four times a day (QID) | INTRAMUSCULAR | Status: DC | PRN

## 2016-06-23 MED ORDER — ENOXAPARIN SODIUM 40 MG/0.4ML ~~LOC~~ SOLN
40.0000 mg | SUBCUTANEOUS | Status: DC
  Administered 2016-06-23 – 2016-06-27 (×5): 40 mg via SUBCUTANEOUS
  Filled 2016-06-23 (×5): qty 0.4

## 2016-06-23 MED ORDER — MOMETASONE FURO-FORMOTEROL FUM 200-5 MCG/ACT IN AERO
1.0000 | INHALATION_SPRAY | Freq: Two times a day (BID) | RESPIRATORY_TRACT | Status: DC
  Administered 2016-06-23 – 2016-06-28 (×12): 1 via RESPIRATORY_TRACT
  Filled 2016-06-23 (×2): qty 8.8

## 2016-06-23 MED ORDER — THEOPHYLLINE ER 400 MG PO TB24
400.0000 mg | ORAL_TABLET | Freq: Two times a day (BID) | ORAL | Status: DC
  Filled 2016-06-23: qty 1

## 2016-06-23 MED ORDER — CHLORHEXIDINE GLUCONATE 0.12 % MT SOLN
15.0000 mL | Freq: Two times a day (BID) | OROMUCOSAL | Status: DC
  Administered 2016-06-23 – 2016-06-28 (×10): 15 mL via OROMUCOSAL
  Filled 2016-06-23 (×8): qty 15

## 2016-06-23 MED ORDER — LEVALBUTEROL HCL 1.25 MG/0.5ML IN NEBU
1.2500 mg | INHALATION_SOLUTION | Freq: Four times a day (QID) | RESPIRATORY_TRACT | Status: DC | PRN
  Administered 2016-06-23 – 2016-06-27 (×5): 1.25 mg via RESPIRATORY_TRACT
  Filled 2016-06-23 (×5): qty 0.5

## 2016-06-23 MED ORDER — LEVOFLOXACIN 750 MG PO TABS
750.0000 mg | ORAL_TABLET | Freq: Every day | ORAL | Status: DC
  Administered 2016-06-23 – 2016-06-26 (×4): 750 mg via ORAL
  Filled 2016-06-23 (×2): qty 1
  Filled 2016-06-23: qty 2
  Filled 2016-06-23: qty 1

## 2016-06-23 MED ORDER — ONDANSETRON HCL 4 MG PO TABS: 4.0000 mg | ORAL_TABLET | Freq: Four times a day (QID) | ORAL | Status: DC | PRN

## 2016-06-23 MED ORDER — GUAIFENESIN 100 MG/5ML PO SOLN
5.0000 mL | Freq: Four times a day (QID) | ORAL | Status: DC | PRN
  Administered 2016-06-23: 100 mg via ORAL
  Filled 2016-06-23 (×2): qty 5

## 2016-06-23 MED ORDER — ESCITALOPRAM OXALATE 10 MG PO TABS
20.0000 mg | ORAL_TABLET | Freq: Every day | ORAL | Status: DC
  Administered 2016-06-23 – 2016-06-27 (×6): 20 mg via ORAL
  Filled 2016-06-23 (×6): qty 2

## 2016-06-23 MED ORDER — ACETAMINOPHEN 325 MG PO TABS: 650.0000 mg | ORAL_TABLET | Freq: Four times a day (QID) | ORAL | Status: DC | PRN

## 2016-06-23 MED ORDER — FUROSEMIDE 20 MG PO TABS
20.0000 mg | ORAL_TABLET | Freq: Every day | ORAL | Status: DC
  Administered 2016-06-23 – 2016-06-28 (×6): 20 mg via ORAL
  Filled 2016-06-23 (×6): qty 1

## 2016-06-23 MED ORDER — THEOPHYLLINE ER 200 MG PO CP24
400.0000 mg | ORAL_CAPSULE | Freq: Two times a day (BID) | ORAL | Status: DC
  Administered 2016-06-23 – 2016-06-28 (×12): 400 mg via ORAL
  Filled 2016-06-23 (×13): qty 2

## 2016-06-23 MED ORDER — ORAL CARE MOUTH RINSE
15.0000 mL | Freq: Two times a day (BID) | OROMUCOSAL | Status: DC
  Administered 2016-06-23 – 2016-06-28 (×8): 15 mL via OROMUCOSAL

## 2016-06-23 MED ORDER — INFLUENZA VAC SPLIT QUAD 0.5 ML IM SUSY
0.5000 mL | PREFILLED_SYRINGE | INTRAMUSCULAR | Status: AC
  Administered 2016-06-24: 10:00:00 0.5 mL via INTRAMUSCULAR
  Filled 2016-06-23: qty 0.5

## 2016-06-23 NOTE — Plan of Care (Signed)
Gave report to Phelps DodgeDanielle RN 1C

## 2016-06-23 NOTE — Plan of Care (Signed)
Problem: Nutrition: Goal: Adequate nutrition will be maintained Outcome: Progressing POC reviewed with patient, cont on repositioning in bed, vital signs closely monitored and any concerns addressed at this time. Will continue to monitor.   BP 125/75   Pulse (!) 106   Temp 97.3 F (36.3 C) (Axillary)   Resp 19   Ht 5\' 10"  (1.778 m)   Wt 96.9 kg (213 lb 10 oz)   SpO2 92%   BMI 30.65 kg/m   [place] @3  l/min    Mental Orientation: A&O x 4 Telemetry: Pt placed on monitor. Central tele and Elink  Assessment: Completed Skin: wnl  IV: flushes easily, no pain, blood return Pain: no pain  Environmental changes completed to facilitate rest and relaxation.  Safety Measures: Bed alarm obn, 2/4 bed rails up.  Unit Orientation: Pt and family oriented to room, has received patient guide, and taught how to use call bell system.   Uses Urinal  CMP Latest Ref Rng & Units 06/23/2016 06/22/2016 06/01/2016  Glucose 65 - 99 mg/dL 161(W158(H) 960(A118(H) 97  BUN 6 - 20 mg/dL 11 9 13   Creatinine 0.61 - 1.24 mg/dL 5.401.17 9.81(X1.25(H) 9.141.07  Sodium 135 - 145 mmol/L 143 144 144  Potassium 3.5 - 5.1 mmol/L 4.9 2.9(L) 4.0  Chloride 101 - 111 mmol/L 104 101 103  CO2 22 - 32 mmol/L 34(H) 36(H) 37(H)  Calcium 8.9 - 10.3 mg/dL 9.0 9.1 9.2  Total Protein 6.5 - 8.1 g/dL - - 6.9  Total Bilirubin 0.3 - 1.2 mg/dL - - 0.7  Alkaline Phos 38 - 126 U/L - - 56  AST 15 - 41 U/L - - 20  ALT 17 - 63 U/L - - 13(L)

## 2016-06-23 NOTE — Care Management (Signed)
Patient was admitted to icu stepdown due to need for continuous bipap.  he has since been transitioned to nasal cannula.  Checking with [facility] to determine if patient was able to get the lighter weigh portable 02 tanks (in home portable 02 assessment set up during last admission).  found that patient did receive the lighter weight portable 02 set up

## 2016-06-23 NOTE — Progress Notes (Signed)
Pt was on 3L O2 nasal canula on arrival to floor and sats were 86.  Pt bumped up to 5L and came up to 91.

## 2016-06-23 NOTE — Care Management Important Message (Signed)
Important Message  Patient Details  Name: Hershal CoriaDennis R Mier MRN: [id number] Date of Birth: 09-23-1955   Medicare Important Message Given:  Yes    Chapman FitchBOWEN, Charda Janis T, RN 06/23/2016, 3:16 PM

## 2016-06-23 NOTE — Progress Notes (Signed)
[facility] [place] [facility]   PATIENT NAME: Dustin [name]    MR#:  [id number]  DATE OF BIRTH:  [birth year]  SUBJECTIVE:  CHIEF COMPLAINT:   Chief Complaint  Patient presents with  . Cough  . Shortness of Breath     Came with cough and SOB, was on Bipap in night. Now tapered off and on nasal canula oxygen now.  REVIEW OF SYSTEMS:  CONSTITUTIONAL: No fever, fatigue or weakness.  EYES: No blurred or double vision.  EARS, NOSE, AND THROAT: No tinnitus or ear pain.  RESPIRATORY: positive for cough, shortness of breath, wheezing or hemoptysis. Yellowish sputum. CARDIOVASCULAR: No chest pain, orthopnea, edema.  GASTROINTESTINAL: No nausea, vomiting, diarrhea or abdominal pain.  GENITOURINARY: No dysuria, hematuria.  ENDOCRINE: No polyuria, nocturia,  HEMATOLOGY: No anemia, easy bruising or bleeding SKIN: No rash or lesion. MUSCULOSKELETAL: No joint pain or arthritis.   NEUROLOGIC: No tingling, numbness, weakness.  PSYCHIATRY: No anxiety or depression.   ROS  DRUG ALLERGIES:   Allergies  Allergen Reactions  . Asa [Aspirin] Other (See Comments)    Reaction: swelling of the right side.  . Codeine Other (See Comments)    Reaction: Difficulty breathing  . Ibuprofen Other (See Comments)    Reaction: Swelling of the right side.    VITALS:  Blood pressure 127/76, pulse (!) 106, temperature 97.7 F (36.5 C), temperature source Oral, resp. rate (!) 25, height 5\' 10"  (1.778 m), weight 96.9 kg (213 lb 10 oz), SpO2 (!) 86 %.  PHYSICAL EXAMINATION:  GENERAL:  60 y.o.-year-old patient lying in the bed with no acute distress.  EYES: Pupils equal, round, reactive to light and accommodation. No scleral icterus. Extraocular muscles intact.  HEENT: Head atraumatic, normocephalic. Oropharynx and nasopharynx clear.  NECK:  Supple, no jugular venous distention. No thyroid enlargement, no tenderness.  LUNGS: Normal breath sounds bilaterally, some wheezing, no  crepitation. No use of accessory muscles of respiration. On nasal canula oxygen. CARDIOVASCULAR: S1, S2 normal. No murmurs, rubs, or gallops.  ABDOMEN: Soft, nontender, nondistended. Bowel sounds present. No organomegaly or mass.  EXTREMITIES: No pedal edema, cyanosis, or clubbing.  NEUROLOGIC: Cranial nerves II through XII are intact. Muscle strength 5/5 in all extremities. Sensation intact. Gait not checked.  PSYCHIATRIC: The patient is alert and oriented x 3.  SKIN: No obvious rash, lesion, or ulcer.   Physical Exam LABORATORY PANEL:   CBC  Recent Labs Lab 06/23/16 0649  WBC 8.9  HGB 12.6*  HCT 37.7*  PLT 276   ------------------------------------------------------------------------------------------------------------------  Chemistries   Recent Labs Lab 06/23/16 0649  NA 143  K 4.9  CL 104  CO2 34*  GLUCOSE 158*  BUN 11  CREATININE 1.17  CALCIUM 9.0   ------------------------------------------------------------------------------------------------------------------  Cardiac Enzymes  Recent Labs Lab 06/23/16 0649 06/23/16 1142  TROPONINI <0.03 <0.03   ------------------------------------------------------------------------------------------------------------------  RADIOLOGY:  Dg Chest 2 View  Result Date: 06/22/2016 CLINICAL DATA:  Cough for 1.5 weeks. Increasing dyspnea for 3-4 days. 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 Patchy airspace opacities at the right lung base have the appearance of atelectasis but superimposed pneumonic infiltrate is not entirely excluded. Pulmonary nodule in the right lower lobe laterally seen on recent low-dose CT scan is not apparent apparent radiographically likely due to overlap with the atelectatic and possibly  pneumonic airspace opacities  currently noted. The heart is mildly enlarged. The aorta is not aneurysmal. Numerous surgical clips are seen in the left upper quadrant of the abdomen. 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 No acute osseous abnormality. 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 Right lower lobe atelectasis and/or infiltrate. Mild cardiomegaly. Electronically Signed   By: Tollie Eth M.D.   On: 06/22/2016 16:57    ASSESSMENT AND PLAN:   Principal Problem:   COPD exacerbation (HCC) Active Problems:   Acute on chronic diastolic CHF (congestive heart failure) (HCC)   Anxiety   HTN (hypertension)   * Ac respi failure with COPD exacerbation   IV steroids, nebs, Abx.   Sputum cx to check.\   Required bipap initially, now on nasal canula oxygen.  *  Acute on chronic diastolic CHF (congestive heart failure) (HCC) - chest x-ray was not suggestive of heart failure changes, but his BNP was elevated. We will use Xopenex for his nebulizer treatments given his tachycardia, to hopefully prevent persistent tachycardia which could exacerbate his heart failure. We will continue his home doses of heart failure medications  *  Anxiety - home dose anxiolytics *  HTN (hypertension) - continue home meds  * hypokalemia   Replaced.   All the records are reviewed and case discussed with Care Management/Social Workerr. Management plans discussed with the patient, family and they are in agreement.  CODE STATUS: full.  TOTAL TIME TAKING CARE OF THIS PATIENT: 35 minutes.    POSSIBLE D/C IN 1-2 DAYS, DEPENDING ON CLINICAL CONDITION.   Altamese Dilling M.D on 06/23/2016   Between 7am to 6pm - Pager - [phone]  After 6pm go to [email or web address] - password [employer]  [facility]  Office  [phone]  CC: Primary care physician; Lyndon Code, MD  Note: This dictation was prepared with Dragon dictation along with smaller phrase technology. Any transcriptional errors that  result from this process are unintentional.

## 2016-06-23 NOTE — Plan of Care (Signed)
Pt having productive cough- tan thick moderate drainage- MD aware and cough med prn given  Sputum sent to lab  Pt sats ok on 3 l/min - transfer to med-surge notele order placed

## 2016-06-24 LAB — EXPECTORATED SPUTUM ASSESSMENT W GRAM STAIN, RFLX TO RESP C

## 2016-06-24 LAB — EXPECTORATED SPUTUM ASSESSMENT W REFEX TO RESP CULTURE

## 2016-06-24 MED ORDER — SODIUM CHLORIDE 0.9% FLUSH
3.0000 mL | INTRAVENOUS | Status: DC | PRN
  Administered 2016-06-24 – 2016-06-25 (×4): 3 mL via INTRAVENOUS
  Filled 2016-06-24 (×4): qty 3

## 2016-06-24 NOTE — Progress Notes (Signed)
Pulse ox check at 91-92 % on 02 4l/[place] maintained with Dr. Cheyenne AdasVanchani made aware and agreed to maintain pt at current level. DOE that resolves with rest. Small amount green sputum producted with occasional cough. Wears CPAP when sleeping.

## 2016-06-24 NOTE — Progress Notes (Signed)
[facility] - [place] at [facility]   PATIENT NAME: Dustin [name]    MR#:  [id number]  DATE OF BIRTH:  06/24/1956  SUBJECTIVE:  CHIEF COMPLAINT:   Chief Complaint  Patient presents with  . Cough  . Shortness of Breath     Came with cough and SOB, was on Bipap in night. Now tapered off and on nasal canula oxygen now.   Still have cough, but feels little better today.  REVIEW OF SYSTEMS:  CONSTITUTIONAL: No fever, fatigue or weakness.  EYES: No blurred or double vision.  EARS, NOSE, AND THROAT: No tinnitus or ear pain.  RESPIRATORY: positive for cough, shortness of breath, wheezing or hemoptysis. Yellowish sputum. CARDIOVASCULAR: No chest pain, orthopnea, edema.  GASTROINTESTINAL: No nausea, vomiting, diarrhea or abdominal pain.  GENITOURINARY: No dysuria, hematuria.  ENDOCRINE: No polyuria, nocturia,  HEMATOLOGY: No anemia, easy bruising or bleeding SKIN: No rash or lesion. MUSCULOSKELETAL: No joint pain or arthritis.   NEUROLOGIC: No tingling, numbness, weakness.  PSYCHIATRY: No anxiety or depression.   ROS  DRUG ALLERGIES:   Allergies  Allergen Reactions  . Asa [Aspirin] Other (See Comments)    Reaction: swelling of the right side.  . Codeine Other (See Comments)    Reaction: Difficulty breathing  . Ibuprofen Other (See Comments)    Reaction: Swelling of the right side.    VITALS:  Blood pressure 131/87, pulse (!) 106, temperature 97.7 F (36.5 C), resp. rate 20, height 5\' 10"  (1.778 m), weight 98.2 kg (216 lb 9.6 oz), SpO2 91 %.  PHYSICAL EXAMINATION:  GENERAL:  60 y.o.-year-old patient lying in the bed with no acute distress.  EYES: Pupils equal, round, reactive to light and accommodation. No scleral icterus. Extraocular muscles intact.  HEENT: Head atraumatic, normocephalic. Oropharynx and nasopharynx clear.  NECK:  Supple, no jugular venous distention. No thyroid enlargement, no tenderness.  LUNGS: Normal breath sounds bilaterally, some  wheezing, no crepitation. No use of accessory muscles of respiration. On nasal canula oxygen. CARDIOVASCULAR: S1, S2 normal. No murmurs, rubs, or gallops.  ABDOMEN: Soft, nontender, nondistended. Bowel sounds present. No organomegaly or mass.  EXTREMITIES: No pedal edema, cyanosis, or clubbing.  NEUROLOGIC: Cranial nerves II through XII are intact. Muscle strength 5/5 in all extremities. Sensation intact. Gait not checked.  PSYCHIATRIC: The patient is alert and oriented x 3.  SKIN: No obvious rash, lesion, or ulcer.   Physical Exam LABORATORY PANEL:   CBC  Recent Labs Lab 06/23/16 0649  WBC 8.9  HGB 12.6*  HCT 37.7*  PLT 276   ------------------------------------------------------------------------------------------------------------------  Chemistries   Recent Labs Lab 06/23/16 0649  NA 143  K 4.9  CL 104  CO2 34*  GLUCOSE 158*  BUN 11  CREATININE 1.17  CALCIUM 9.0   ------------------------------------------------------------------------------------------------------------------  Cardiac Enzymes  Recent Labs Lab 06/23/16 0649 06/23/16 1142  TROPONINI <0.03 <0.03   ------------------------------------------------------------------------------------------------------------------  RADIOLOGY:  Dg Chest 2 View  Result Date: 06/22/2016 CLINICAL DATA:  Cough for 1.5 weeks. Increasing dyspnea for 3-4 days. 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 Patchy airspace opacities at the right lung base have the appearance of atelectasis but superimposed pneumonic infiltrate is not entirely excluded. Pulmonary nodule in the right lower lobe laterally seen on recent low-dose CT scan is not apparent apparent radiographically likely due to overlap  with the atelectatic and possibly pneumonic  airspace opacities currently noted. The heart is mildly enlarged. The aorta is not aneurysmal. Numerous surgical clips are seen in the left upper quadrant of the abdomen. 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 No acute osseous abnormality. 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 Right lower lobe atelectasis and/or infiltrate. Mild cardiomegaly. Electronically Signed   By: Tollie Ethavid  Kwon M.D.   On: 06/22/2016 16:57    ASSESSMENT AND PLAN:   Principal Problem:   COPD exacerbation (HCC) Active Problems:   Acute on chronic diastolic CHF (congestive heart failure) (HCC)   Anxiety   HTN (hypertension)   * Ac respi failure with COPD exacerbation   IV steroids, nebs, Abx.   Sputum cx to check.   Required bipap initially, now on nasal canula oxygen.  *  Acute on chronic diastolic CHF (congestive heart failure) (HCC) - chest x-ray was not suggestive of heart failure changes, but his BNP was elevated. continue his home doses of heart failure medications  *  Anxiety - home dose anxiolytics *  HTN (hypertension) - continue home meds  * hypokalemia   Replaced.   All the records are reviewed and case discussed with Care Management/Social Workerr. Management plans discussed with the patient, family and they are in agreement.  CODE STATUS: full.  TOTAL TIME TAKING CARE OF THIS PATIENT: 35 minutes.    POSSIBLE D/C IN 1-2 DAYS, DEPENDING ON CLINICAL CONDITION.   Altamese DillingVACHHANI, Darlena Koval M.D on 06/24/2016   Between 7am to 6pm - Pager - [phone]  After 6pm go to [email or web address] - password [email or web address]  [facility]  Office  [phone]  CC: Primary care physician; Lyndon CodeKHAN, FOZIA M, MD  Note: This dictation was prepared with Dragon dictation along with smaller phrase technology. Any transcriptional errors that result from this process are unintentional.

## 2016-06-24 NOTE — Plan of Care (Signed)
Problem: Education: Goal: Knowledge of  General Education information/materials will improve Outcome: Progressing O2 need increased from 3L [place] in CCU to 4L [place] after transferring to unit.  Productive cough, sputum sample sent to lab.  On CPAP, satting 95%.  Initially reported CPAP "didn't feel right," but no further complaints.  Reports ordered meds not consistent w/ home regimen.  Namely, nebs ordered Q6H PRN & xanax ordered 3x daily PRN, but takes scheduled at same intervals at home.  Encouraged to bring up w/ MD when rounding, will also pass on to day shift RN.  Denies pain.  New PIV flushed, no issues.  Bed in low position, call bell within reach.  WCTM.

## 2016-06-25 ENCOUNTER — Inpatient Hospital Stay: Payer: Medicare Other

## 2016-06-25 NOTE — Plan of Care (Signed)
Problem: Activity: Goal: Risk for activity intolerance will decrease Outcome: Progressing 3L [place], SpO2 93%.  CPAP overnight.  CXR done, tolerated well.  No complaints overnight.  PIV flushes well, no issues.  Bed in low position, call bell within reach.  WCTM.

## 2016-06-25 NOTE — Plan of Care (Signed)
Problem: Physical Regulation: Goal: Ability to maintain clinical measurements within normal limits will improve Outcome: Not Progressing Attempted to ambulate pt on his previous 02 flow. Pt destaurated to 78% on 02 @ 2l/[place] unable to ambulate but few steps and became dysneic. Pt returned to bed; placed on 02 3l/ with pulse ox 89% on RA.  Will add 02 extension tubing to allow pt to be more active as tolerated such as up to chair/BR with supervision.   Problem: Activity: Goal: Risk for activity intolerance will decrease Outcome: Not Progressing Unable to tolerate short ambulation with 02 on. See previous note.

## 2016-06-25 NOTE — Progress Notes (Signed)
[facility] - [place] at [facility]   PATIENT NAME: Dustin [name]    MR#:  [id number]  DATE OF BIRTH:  01-Mar-1956  SUBJECTIVE:  CHIEF COMPLAINT:   Chief Complaint  Patient presents with  . Cough  . Shortness of Breath     Came with cough and SOB, was on Bipap in night. Now tapered off and on nasal canula oxygen now.   Still have cough, but feels little better today.   On minimal ambulation today his saturation dropped < 80%.  REVIEW OF SYSTEMS:  CONSTITUTIONAL: No fever, fatigue or weakness.  EYES: No blurred or double vision.  EARS, NOSE, AND THROAT: No tinnitus or ear pain.  RESPIRATORY: positive for cough, shortness of breath, wheezing or hemoptysis. Yellowish sputum. CARDIOVASCULAR: No chest pain, orthopnea, edema.  GASTROINTESTINAL: No nausea, vomiting, diarrhea or abdominal pain.  GENITOURINARY: No dysuria, hematuria.  ENDOCRINE: No polyuria, nocturia,  HEMATOLOGY: No anemia, easy bruising or bleeding SKIN: No rash or lesion. MUSCULOSKELETAL: No joint pain or arthritis.   NEUROLOGIC: No tingling, numbness, weakness.  PSYCHIATRY: No anxiety or depression.   ROS  DRUG ALLERGIES:   Allergies  Allergen Reactions  . Asa [Aspirin] Other (See Comments)    Reaction: swelling of the right side.  . Codeine Other (See Comments)    Reaction: Difficulty breathing  . Ibuprofen Other (See Comments)    Reaction: Swelling of the right side.    VITALS:  Blood pressure 123/64, pulse (!) 115, temperature 98.6 F (37 C), resp. rate 20, height 5\' 10"  (1.778 m), weight 97.6 kg (215 lb 3 oz), SpO2 (!) 88 %.  PHYSICAL EXAMINATION:  GENERAL:  60 y.o.-year-old patient lying in the bed with no acute distress.  EYES: Pupils equal, round, reactive to light and accommodation. No scleral icterus. Extraocular muscles intact.  HEENT: Head atraumatic, normocephalic. Oropharynx and nasopharynx clear.  NECK:  Supple, no jugular venous distention. No thyroid enlargement, no  tenderness.  LUNGS: Normal breath sounds bilaterally, some wheezing, no crepitation. No use of accessory muscles of respiration. On nasal canula oxygen. CARDIOVASCULAR: S1, S2 normal. No murmurs, rubs, or gallops.  ABDOMEN: Soft, nontender, nondistended. Bowel sounds present. No organomegaly or mass.  EXTREMITIES: No pedal edema, cyanosis, or clubbing.  NEUROLOGIC: Cranial nerves II through XII are intact. Muscle strength 5/5 in all extremities. Sensation intact. Gait not checked.  PSYCHIATRIC: The patient is alert and oriented x 3.  SKIN: No obvious rash, lesion, or ulcer.   Physical Exam LABORATORY PANEL:   CBC  Recent Labs Lab 06/23/16 0649  WBC 8.9  HGB 12.6*  HCT 37.7*  PLT 276   ------------------------------------------------------------------------------------------------------------------  Chemistries   Recent Labs Lab 06/23/16 0649  NA 143  K 4.9  CL 104  CO2 34*  GLUCOSE 158*  BUN 11  CREATININE 1.17  CALCIUM 9.0   ------------------------------------------------------------------------------------------------------------------  Cardiac Enzymes  Recent Labs Lab 06/23/16 0649 06/23/16 1142  TROPONINI <0.03 <0.03   ------------------------------------------------------------------------------------------------------------------  RADIOLOGY:  Dg Chest 2 View  Result Date: 06/25/2016 CLINICAL DATA:  Continued surveillance of pneumonia. EXAM: CHEST  2 VIEW COMPARISON:  06/22/2016. FINDINGS: Improved basilar opacities but not yet clear. No effusion or pneumothorax. Unchanged cardiomediastinal silhouette. Unchanged apical bullous emphysema, worse on the RIGHT. IMPRESSION: Improved aeration.  COPD. Electronically Signed   By: Elsie StainJohn T Curnes M.D.   On: 06/25/2016 07:12    ASSESSMENT AND PLAN:   Principal Problem:   COPD exacerbation (HCC) Active Problems:   Acute on chronic diastolic CHF (congestive  heart failure) (HCC)   Anxiety   HTN  (hypertension)   * Ac respi failure with COPD exacerbation   IV steroids, nebs, Abx.   Sputum cx to check.   Required bipap initially, now on nasal canula oxygen.   Desaturation on minimal exertion.  *  Acute on chronic diastolic CHF (congestive heart failure) (HCC) - chest x-ray was not suggestive of heart failure changes, but his BNP was elevated. continue his home doses of heart failure medications  *  Anxiety - home dose anxiolytics *  HTN (hypertension) - continue home meds  * hypokalemia   Replaced.   All the records are reviewed and case discussed with Care Management/Social Workerr. Management plans discussed with the patient, family and they are in agreement.  CODE STATUS: full.  TOTAL TIME TAKING CARE OF THIS PATIENT: 35 minutes.  Still have significant desaturation.  POSSIBLE D/C IN 1-2 DAYS, DEPENDING ON CLINICAL CONDITION.   Altamese Dilling M.D on 06/25/2016   Between 7am to 6pm - Pager - [phone]  After 6pm go to [email or web address] - password [employer]  [facility]  Office  [phone]  CC: Primary care physician; Lyndon Code, MD  Note: This dictation was prepared with Dragon dictation along with smaller phrase technology. Any transcriptional errors that result from this process are unintentional.

## 2016-06-26 LAB — CULTURE, RESPIRATORY: CULTURE: NORMAL

## 2016-06-26 LAB — CULTURE, RESPIRATORY W GRAM STAIN

## 2016-06-26 MED ORDER — DOCUSATE SODIUM 100 MG PO CAPS
100.0000 mg | ORAL_CAPSULE | Freq: Two times a day (BID) | ORAL | Status: DC
  Filled 2016-06-26: qty 1

## 2016-06-26 MED ORDER — POLYETHYLENE GLYCOL 3350 17 G PO PACK: 17.0000 g | PACK | Freq: Every day | ORAL | Status: DC | PRN

## 2016-06-26 MED ORDER — METHYLPREDNISOLONE SODIUM SUCC 125 MG IJ SOLR
60.0000 mg | INTRAMUSCULAR | Status: DC
  Administered 2016-06-27 – 2016-06-28 (×2): 60 mg via INTRAVENOUS
  Filled 2016-06-26 (×2): qty 2

## 2016-06-26 NOTE — Progress Notes (Signed)
[facility] - [place] at [facility]   PATIENT NAME: Dustin [name]    MRN#:  [id number]  DATE OF BIRTH:  25-Apr-1956  SUBJECTIVE:  Hospital Day: 4 days Dustin [name] is a 60 y.o. male presenting with Cough and Shortness of Breath .   Overnight events: Had episodes of anxiety overnight Interval Events: No new current complaints still shortness of breath with activity  REVIEW OF SYSTEMS:  CONSTITUTIONAL: No fever, fatigue or weakness.  EYES: No blurred or double vision.  EARS, NOSE, AND THROAT: No tinnitus or ear pain.  RESPIRATORY: No cough, Positive shortness of breath, denies wheezing or hemoptysis.  CARDIOVASCULAR: No chest pain, orthopnea, edema.  GASTROINTESTINAL: No nausea, vomiting, diarrhea or abdominal pain.  GENITOURINARY: No dysuria, hematuria.  ENDOCRINE: No polyuria, nocturia,  HEMATOLOGY: No anemia, easy bruising or bleeding SKIN: No rash or lesion. MUSCULOSKELETAL: No joint pain or arthritis.   NEUROLOGIC: No tingling, numbness, weakness.  PSYCHIATRY: Positive anxiety denies depression.   DRUG ALLERGIES:   Allergies  Allergen Reactions  . Asa [Aspirin] Other (See Comments)    Reaction: swelling of the right side.  . Codeine Other (See Comments)    Reaction: Difficulty breathing  . Ibuprofen Other (See Comments)    Reaction: Swelling of the right side.    VITALS:  Blood pressure 132/77, pulse 96, temperature 98 F (36.7 C), temperature source Oral, resp. rate 20, height 5\' 10"  (1.778 m), weight 98.1 kg (216 lb 4.8 oz), SpO2 94 %.  PHYSICAL EXAMINATION:  VITAL SIGNS: Vitals:   06/25/16 2137 06/26/16 0505  BP: (!) 141/76 132/77  Pulse: (!) 101 96  Resp: 20 20  Temp: 97.9 F (36.6 C) 98 F (36.7 C)   GENERAL:60 y.o.male currently in no acute distress.  HEAD: Normocephalic, atraumatic.  EYES: Pupils equal, round, reactive to light. Extraocular muscles intact. No scleral icterus.  MOUTH: Moist mucosal membrane. Dentition  intact. No abscess noted.  EAR, NOSE, THROAT: Clear without exudates. No external lesions.  NECK: Supple. No thyromegaly. No nodules. No JVD.  PULMONARY: Diminished breath sounds without wheeze rails or rhonci. No use of accessory muscles, Good respiratory effort. good air entry bilaterally CHEST: Nontender to palpation.  CARDIOVASCULAR: S1 and S2. Regular rate and rhythm. No murmurs, rubs, or gallops. No edema. Pedal pulses 2+ bilaterally.  GASTROINTESTINAL: Soft, nontender, nondistended. No masses. Positive bowel sounds. No hepatosplenomegaly.  MUSCULOSKELETAL: No swelling, clubbing, or edema. Range of motion full in all extremities.  NEUROLOGIC: Cranial nerves II through XII are intact. No gross focal neurological deficits. Sensation intact. Reflexes intact.  SKIN: No ulceration, lesions, rashes, or cyanosis. Skin warm and dry. Turgor intact.  PSYCHIATRIC: Mood, affect within normal limits. The patient is awake, alert and oriented x 3. Insight, judgment intact.      LABORATORY PANEL:   CBC  Recent Labs Lab 06/23/16 0649  WBC 8.9  HGB 12.6*  HCT 37.7*  PLT 276   ------------------------------------------------------------------------------------------------------------------  Chemistries   Recent Labs Lab 06/23/16 0649  NA 143  K 4.9  CL 104  CO2 34*  GLUCOSE 158*  BUN 11  CREATININE 1.17  CALCIUM 9.0   ------------------------------------------------------------------------------------------------------------------  Cardiac Enzymes  Recent Labs Lab 06/23/16 1142  TROPONINI <0.03   ------------------------------------------------------------------------------------------------------------------  RADIOLOGY:  Dg Chest 2 View  Result Date: 06/25/2016 CLINICAL DATA:  Continued surveillance of pneumonia. EXAM: CHEST  2 VIEW COMPARISON:  06/22/2016. FINDINGS: Improved basilar opacities but not yet clear. No effusion or pneumothorax. Unchanged cardiomediastinal  silhouette. Unchanged apical  bullous emphysema, worse on the RIGHT. IMPRESSION: Improved aeration.  COPD. Electronically Signed   By: Elsie StainJohn T Curnes M.D.   On: 06/25/2016 07:12    EKG:   Orders placed or performed during the hospital encounter of 06/22/16  . ED EKG  . ED EKG  . EKG    ASSESSMENT AND PLAN:   Dustin [name] is a 60 y.o. male presenting with Cough and Shortness of Breath . Admitted 06/22/2016 : Day #: 4 days 1. Acute on chronic respiratory failure with hypoxia: COPD exacerbation: Decreased steroids, continue breathing treatments, oxygen goal saturations greater than 88%, Levaquin day # 4/5 2. Essential hypertension: Home medications, stable 3. Chronic diastolic congestive heart failure continue home medications  Plan: Continue to wean oxygen to baseline, hopeful discharge tomorrow  All the records are reviewed and case discussed with Care Management/Social Workerr. Management plans discussed with the patient, family and they are in agreement.  CODE STATUS: full TOTAL TIME TAKING CARE OF THIS PATIENT: 28 minutes.   POSSIBLE D/C IN 1-2DAYS, DEPENDING ON CLINICAL CONDITION.   Hower,  Mardi MainlandDavid K M.D on 06/26/2016 at 11:57 AM  Between 7am to 6pm - Pager - [phone]  After 6pm: House Pager: - [phone]  [name]  Office  [phone]  CC: Primary care physician; Lyndon CodeKHAN, FOZIA M, MD

## 2016-06-27 LAB — CBC
HCT: 42.2 % (ref 40.0–52.0)
Hemoglobin: 13.8 g/dL (ref 13.0–18.0)
MCH: 32 pg (ref 26.0–34.0)
MCHC: 32.6 g/dL (ref 32.0–36.0)
MCV: 98.2 fL (ref 80.0–100.0)
PLATELETS: 312 10*3/uL (ref 150–440)
RBC: 4.3 MIL/uL — ABNORMAL LOW (ref 4.40–5.90)
RDW: 13.7 % (ref 11.5–14.5)
WBC: 14.2 10*3/uL — AB (ref 3.8–10.6)

## 2016-06-27 LAB — CREATININE, SERUM
Creatinine, Ser: 1.22 mg/dL (ref 0.61–1.24)
GFR calc non Af Amer: >60 mL/min (ref >60)

## 2016-06-27 MED ORDER — PREDNISONE 10 MG (21) PO TBPK: ORAL_TABLET | ORAL | 0 refills | Status: DC

## 2016-06-27 NOTE — Progress Notes (Signed)
O2 sat on RA at rest: 84% O2 sat on RA while ambulating: 80% O2 sat on oxygen while ambulating: 89%

## 2016-06-27 NOTE — Care Management Note (Signed)
Case Management Note  Patient Details  Name: Dustin [name] MRN: [id number] Date of Birth: 14-May-1956  Subjective/Objective:         New home oxygen orders for 3L  N/C  And qualifying sats called to Lonn GeorgiaBarb Taylor at [facility]. Lesle ReekBarb will supply new oxygen tanks that will provide 3L N/C continuous. Dustin [name] currently has an oxygen concentrator which will not provide more than 2L N/C. [facility] will also set up new oxygen at Dustin [name]'s home today.           Action/Plan:   Expected Discharge Date:                  Expected Discharge Plan:     In-House Referral:     Discharge planning Services     Post Acute Care Choice:    Choice offered to:     DME Arranged:    DME Agency:     HH Arranged:    HH Agency:     Status of Service:     If discussed at [employer] Length of Stay Meetings, dates discussed:    Additional Comments:  Dustin Furukawa A, RN 06/27/2016, 3:43 PM

## 2016-06-27 NOTE — Progress Notes (Signed)
Dr. Clint GuyHower notified patient O2 saturation 86% on 2L oxygen at rest. Patient shows concern due to home portable oxygen concentrator only goes to 2L and does not want to be placed back on regular oxygen tanks. Case Manager, Larita FifeLynn, notified to see of oxygen options as patient is stable for discharge other than oxygen supplementation.

## 2016-06-27 NOTE — Progress Notes (Addendum)
Dr. Clint GuyHower notified patient is refusing to go home due to having to change from oxygen concentrator to oxygen cylinder which were in place to be set up by [facility] today.   Patient wants to go to rehab in hope of weaning oxygen there instead of changing his oxygen supply at home. PT eval order to be placed.

## 2016-06-27 NOTE — Progress Notes (Signed)
Patient to be discharged, qualified for higher oxygen than baseline 3L as opposed to 2L Case management has already set everything up No refuses to leave, does not want new oxygen... Now wants rehab, pt eval to see if qualifies

## 2016-06-27 NOTE — Discharge Summary (Addendum)
[facility] - [place] at [facility]   PATIENT NAME: Dustin [name]    MR#:  [id number]  DATE OF BIRTH:  Sep 08, 1956  DATE OF ADMISSION:  06/22/2016 ADMITTING PHYSICIAN: Oralia Manisavid Willis, MD  DATE OF DISCHARGE: 06/27/16  PRIMARY CARE PHYSICIAN: Lyndon CodeKHAN, FOZIA M, MD    ADMISSION DIAGNOSIS:  COPD exacerbation (HCC) [J44.1] Acute respiratory failure with hypoxia (HCC) [J96.01]  DISCHARGE DIAGNOSIS:  Principal Problem: Acute on chronic respiratory with hypoxia   COPD exacerbation (HCC) Active Problems: chronic diastolic CHF (congestive heart failure) (HCC)   Anxiety   HTN (hypertension)   SECONDARY DIAGNOSIS:   Past Medical History:  Diagnosis Date  . Allergy   . Anxiety   . Anxiety   . Asthma   . Chronic diastolic CHF (congestive heart failure) (HCC)    a. echo 07/2013: EF 60-65%, DD, biatrial dilatation, Ao sclerosis, dilated RV, moderate pulmonary HTN, elevated CV and RA pressures; b. patient reported echo at Dr. Milta DeitersKhan's office 02/2015 - his office does not have record of him being a pt there c. echo 11/2015: EF 60-65%, Grade 1 DD, mod-severe pulm pressures  . Chronic respiratory failure (HCC)    a. on 2L via nasal cannula; b. secondary to COPD  . COPD (chronic obstructive pulmonary disease) (HCC)   . Depression   . Depression   . Depression   . Emphysema of lung (HCC)   . GERD (gastroesophageal reflux disease)   . Hypertension   . Personal history of tobacco use, presenting hazards to health 08/17/2015  . Tobacco abuse     HOSPITAL COURSE:  Dustin PhenixDennis Chaudhary  is a 60 y.o. male admitted 06/22/2016 with chief complaint Cough and Shortness of Breath . Please see H&P performed by Oralia Manisavid Willis, MD for further information. Patient presented with the above symptoms requiring higher than baseline oxygen. After receiving antibiotics for anti-inflammation, breathing treatments, steroids he is back to his baseline oxygen requirement.  DISCHARGE CONDITIONS:    Stable  CONSULTS OBTAINED:    DRUG ALLERGIES:   Allergies  Allergen Reactions  . Asa [Aspirin] Other (See Comments)    Reaction: swelling of the right side.  . Codeine Other (See Comments)    Reaction: Difficulty breathing  . Ibuprofen Other (See Comments)    Reaction: Swelling of the right side.    DISCHARGE MEDICATIONS:   Current Discharge Medication List    START taking these medications   Details  predniSONE (STERAPRED UNI-PAK 21 TAB) 10 MG (21) TBPK tablet 40mg  oral 1 day, then 20mg  oral for 2 days, then 10mg  oral 2 days, then stop Qty: 10 tablet, Refills: 0      CONTINUE these medications which have NOT CHANGED   Details  Albuterol Sulfate (PROAIR RESPICLICK) 108 (90 BASE) MCG/ACT AEPB Inhale 2 puffs into the lungs every 4 (four) hours as needed (for shortness of breath). Reported on 12/10/2015    ALPRAZolam (XANAX) 1 MG tablet Take 1 tablet (1 mg total) by mouth 3 (three) times daily as needed for anxiety. Qty: 15 tablet, Refills: 0    escitalopram (LEXAPRO) 20 MG tablet Take 20 mg by mouth at bedtime.     furosemide (LASIX) 20 MG tablet Take 20 mg by mouth.    gabapentin (NEURONTIN) 600 MG tablet Take 600 mg by mouth 3 (three) times daily.    ipratropium-albuterol (DUONEB) 0.5-2.5 (3) MG/3ML SOLN Take 3 mLs by nebulization 4 (four) times daily as needed (for shortness of breath).    mometasone-formoterol (DULERA) 200-5 MCG/ACT  AERO Inhale 1 puff into the lungs 2 (two) times daily.     potassium chloride (K-DUR,KLOR-CON) 10 MEQ tablet Take 10 mEq by mouth once.    theophylline (UNIPHYL) 400 MG 24 hr tablet Take 400 mg by mouth 2 (two) times daily.    Tiotropium Bromide Monohydrate (SPIRIVA RESPIMAT) 2.5 MCG/ACT AERS Inhale 2 puffs into the lungs daily.          DISCHARGE INSTRUCTIONS:    DIET:  Cardiac diet  DISCHARGE CONDITION:  Stable  ACTIVITY:  Activity as tolerated  OXYGEN:  Home Oxygen: Yes.     Oxygen Delivery: 3 liters/min via  Patient connected to nasal cannula oxygen  DISCHARGE LOCATION:  home   If you experience worsening of your admission symptoms, develop shortness of breath, life threatening emergency, suicidal or homicidal thoughts you must seek medical attention immediately by calling 911 or calling your MD immediately  if symptoms less severe.  You Must read complete instructions/literature along with all the possible adverse reactions/side effects for all the Medicines you take and that have been prescribed to you. Take any new Medicines after you have completely understood and accpet all the possible adverse reactions/side effects.   Please note  You were cared for by a hospitalist during your hospital stay. If you have any questions about your discharge medications or the care you received while you were in the hospital after you are discharged, you can call the unit and asked to speak with the hospitalist on call if the hospitalist that took care of you is not available. Once you are discharged, your primary care physician will handle any further medical issues. Please note that NO REFILLS for any discharge medications will be authorized once you are discharged, as it is imperative that you return to your primary care physician (or establish a relationship with a primary care physician if you do not have one) for your aftercare needs so that they can reassess your need for medications and monitor your lab values.    On the day of Discharge:   VITAL SIGNS:  Blood pressure 119/64, pulse 91, temperature 97.5 F (36.4 C), temperature source Oral, resp. rate 16, height 5\' 10"  (1.778 m), weight 97.5 kg (215 lb), SpO2 90 %.  I/O:   Intake/Output Summary (Last 24 hours) at 06/27/16 1202 Last data filed at 06/27/16 0915  Gross per 24 hour  Intake              840 ml  Output              700 ml  Net              140 ml    PHYSICAL EXAMINATION:  GENERAL:  60 y.o.-year-old patient lying in the bed with no  acute distress.  EYES: Pupils equal, round, reactive to light and accommodation. No scleral icterus. Extraocular muscles intact.  HEENT: Head atraumatic, normocephalic. Oropharynx and nasopharynx clear.  NECK:  Supple, no jugular venous distention. No thyroid enlargement, no tenderness.  LUNGS: Normal breath sounds bilaterally, no wheezing, rales,rhonchi or crepitation. No use of accessory muscles of respiration.  CARDIOVASCULAR: S1, S2 normal. No murmurs, rubs, or gallops.  ABDOMEN: Soft, non-tender, non-distended. Bowel sounds present. No organomegaly or mass.  EXTREMITIES: No pedal edema, cyanosis, or clubbing.  NEUROLOGIC: Cranial nerves II through XII are intact. Muscle strength 5/5 in all extremities. Sensation intact. Gait not checked.  PSYCHIATRIC: The patient is alert and oriented x 3.  SKIN: No obvious  rash, lesion, or ulcer.   DATA REVIEW:   CBC  Recent Labs Lab 06/27/16 0517  WBC 14.2*  HGB 13.8  HCT 42.2  PLT 312    Chemistries   Recent Labs Lab 06/23/16 0649 06/27/16 0517  NA 143  --   K 4.9  --   CL 104  --   CO2 34*  --   GLUCOSE 158*  --   BUN 11  --   CREATININE 1.17 1.22  CALCIUM 9.0  --     Cardiac Enzymes  Recent Labs Lab 06/23/16 1142  TROPONINI <0.03    Microbiology Results  Results for orders placed or performed during the hospital encounter of 06/22/16  MRSA PCR Screening     Status: None   Collection Time: 06/22/16 11:59 PM  Result Value Ref Range Status   MRSA by PCR NEGATIVE NEGATIVE Final    Comment:        The GeneXpert MRSA Assay (FDA approved for NASAL specimens only), is one component of a comprehensive MRSA colonization surveillance program. It is not intended to diagnose MRSA infection nor to guide or monitor treatment for MRSA infections.   Culture, expectorated sputum-assessment     Status: None   Collection Time: 06/24/16  4:20 AM  Result Value Ref Range Status   Specimen Description EXPECTORATED SPUTUM  Final    Special Requests NONE  Final   Sputum evaluation THIS SPECIMEN IS ACCEPTABLE FOR SPUTUM CULTURE  Final   Report Status 06/24/2016 FINAL  Final  Culture, respiratory (NON-Expectorated)     Status: None   Collection Time: 06/24/16  4:20 AM  Result Value Ref Range Status   Specimen Description EXPECTORATED SPUTUM  Final   Special Requests NONE Reflexed from [id number]  Final   Gram Stain   Final    FEW WBC PRESENT,BOTH PMN AND MONONUCLEAR FEW SQUAMOUS EPITHELIAL CELLS PRESENT RARE BUDDING YEAST SEEN RARE GRAM POSITIVE COCCI IN PAIRS RARE GRAM NEGATIVE COCCI IN PAIRS    Culture   Final    Consistent with normal respiratory flora. Performed at [facility]    Report Status 06/26/2016 FINAL  Final    RADIOLOGY:  No results found.   Management plans discussed with the patient, family and they are in agreement.  CODE STATUS:     Code Status Orders        Start     Ordered   06/23/16 0002  Full code  Continuous     06/23/16 0001    Code Status History    Date Active Date Inactive Code Status Order ID Comments User Context   03/25/2016  5:03 PM 03/31/2016 11:08 PM Full Code [id number]  Standley Brooking, MD Inpatient   11/30/2015  5:12 AM 12/06/2015  9:34 PM Full Code [id number]  Arnaldo Natal, MD Inpatient   04/13/2015  5:22 PM 04/21/2015  7:40 PM Full Code [id number]  Auburn Bilberry, MD Inpatient   03/31/2015  8:50 PM 04/05/2015  8:48 PM Full Code [id number]  Wyatt Haste, MD ED      TOTAL TIME TAKING CARE OF THIS PATIENT: 33 minutes.    Haru Anspaugh,  Mardi Mainland.D on 06/27/2016 at 12:02 PM  Between 7am to 6pm - Pager - [phone]  After 6pm go to [email or web address] - Scientist, research (life sciences) [facility]  Office  [phone]  CC: Primary care physician; Lyndon Code, MD

## 2016-06-27 NOTE — Clinical Social Work Note (Signed)
Clinical Social Work Assessment  Patient Details  Name: Dustin [name] MRN: [id number] Date of Birth: [birth year]  Date of referral:  06/27/16               Reason for consult:  Discharge Planning                Permission sought to share information with:    Permission granted to share information::     Name::        Agency::     Relationship::     Contact Information:     Housing/Transportation Living arrangements for the past 2 months:  Single Family Home Source of Information:  Patient Patient Interpreter Needed:  None Criminal Activity/Legal Involvement Pertinent to Current Situation/Hospitalization:  No - Comment as needed Significant Relationships:  Adult Children, Other Family Members Lives with:  Self Do you feel safe going back to the place where you live?  No Need for family participation in patient care:  No (Coment)  Care giving concerns:  CSW was informed by RN that patient is requesting SNF placement and refusing to discharge.    Social Worker assessment / plan:  CSW met with patient at bedside. Introduced herself and her role. CSW informed patient that he's ready for discharge today. Per patient he has anxiety especially since he's getting older. Reported that PT has not worked with him and that he wants to go to SNF at discharge. CSW informed patient that he's medically stable for discharge and if he stays an extra day Medicare may not pay for his stay. Patient reported that he wants a PT evaluation. Stated he's requested it several times. CSW requested a PT evaluation be placed for patient. CSW will continue to follow and assist.   Employment status:  Retired Nurse, adult PT Recommendations:  Not assessed at this time Information / Referral to community resources:     Patient/Family's Response to care:  Patient is requesting a PT evaluation to determine is level of functioning.   Patient/Family's Understanding of and Emotional  Response to Diagnosis, Current Treatment, and Prognosis:  Patient understands.   Emotional Assessment Appearance:  Appears stated age Attitude/Demeanor/Rapport:  Complaining Affect (typically observed):  Irritable Orientation:  Oriented to Self, Oriented to Place, Oriented to  Time, Oriented to Situation Alcohol / Substance use:  Not Applicable Psych involvement (Current and /or in the community):  No (Comment)  Discharge Needs  Concerns to be addressed:  Discharge Planning Concerns Readmission within the last 30 days:  No Current discharge risk:  Chronically ill, None Barriers to Discharge:  No Barriers Identified   [place], [name] 06/27/2016, 4:19 PM

## 2016-06-27 NOTE — Care Management Note (Signed)
Case Management Note  Patient Details  Name: Hershal CoriaDennis R Ludwick MRN: [id number] Date of Birth: 01-Oct-1955  Subjective/Objective:            [name] and OT.         Action/Plan:   Expected Discharge Date:                  Expected Discharge Plan:     In-House Referral:     Discharge planning Services     Post Acute Care Choice:    Choice offered to:     DME Arranged:    DME Agency:     HH Arranged:    HH Agency:     Status of Service:     If discussed at [employer] Length of Stay Meetings, dates discussed:    Additional Comments:  Kalilah Barua A, RN 06/27/2016, 2:05 PM

## 2016-06-27 NOTE — Care Management Note (Signed)
Case Management Note  Patient Details  Name: Dustin CoriaDennis R [name] MRN: [id number] Date of Birth: 01/18/1956  Subjective/Objective:      Dustin [name] is now refusing to accept a larger portable oxygen tank and is requesting to go to SNF. [facility]-PT will see him tomorrow to recommend rehab or home with home health. Dustin [name] qualified for a higher level of oxygen today (was on 2L and has order from Dr Clint GuyHower for 3L ). A new oxygen order for 3L N/C continuous and new oxygen progression measurements are in Dustin Knupp's chart.  Lonn GeorgiaBarb Taylor from [facility] is aware of the new oxygen order and will see Dustin [name] tomorrow if PT recommends home with home health.           Action/Plan:   Expected Discharge Date:                  Expected Discharge Plan:     In-House Referral:     Discharge planning Services     Post Acute Care Choice:    Choice offered to:     DME Arranged:    DME Agency:     HH Arranged:    HH Agency:     Status of Service:     If discussed at [employer] Length of Stay Meetings, dates discussed:    Additional Comments:  Tejal Monroy A, RN 06/27/2016, 4:38 PM

## 2016-06-28 ENCOUNTER — Encounter
Admission: RE | Admit: 2016-06-28 | Discharge: 2016-06-28 | Disposition: A | Discharge status: Home / Self Care | Payer: Medicare Other | Source: Ambulatory Visit | Attending: Internal Medicine | Admitting: Internal Medicine

## 2016-06-28 DIAGNOSIS — F419 Anxiety disorder, unspecified: Secondary | ICD-10-CM | POA: Diagnosis present

## 2016-06-28 DIAGNOSIS — I5033 Acute on chronic diastolic (congestive) heart failure: Secondary | ICD-10-CM | POA: Diagnosis present

## 2016-06-28 DIAGNOSIS — Z9989 Dependence on other enabling machines and devices: Secondary | ICD-10-CM | POA: Diagnosis not present

## 2016-06-28 DIAGNOSIS — Z886 Allergy status to analgesic agent status: Secondary | ICD-10-CM | POA: Diagnosis not present

## 2016-06-28 DIAGNOSIS — J441 Chronic obstructive pulmonary disease with (acute) exacerbation: Secondary | ICD-10-CM | POA: Diagnosis not present

## 2016-06-28 DIAGNOSIS — J449 Chronic obstructive pulmonary disease, unspecified: Secondary | ICD-10-CM | POA: Diagnosis not present

## 2016-06-28 DIAGNOSIS — R0602 Shortness of breath: Secondary | ICD-10-CM | POA: Diagnosis present

## 2016-06-28 DIAGNOSIS — M6281 Muscle weakness (generalized): Secondary | ICD-10-CM | POA: Diagnosis not present

## 2016-06-28 DIAGNOSIS — R000 Tachycardia, unspecified: Secondary | ICD-10-CM | POA: Diagnosis present

## 2016-06-28 DIAGNOSIS — Z23 Encounter for immunization: Secondary | ICD-10-CM | POA: Diagnosis not present

## 2016-06-28 DIAGNOSIS — Z9981 Dependence on supplemental oxygen: Secondary | ICD-10-CM | POA: Diagnosis not present

## 2016-06-28 DIAGNOSIS — J432 Centrilobular emphysema: Secondary | ICD-10-CM | POA: Diagnosis not present

## 2016-06-28 DIAGNOSIS — R262 Difficulty in walking, not elsewhere classified: Secondary | ICD-10-CM | POA: Diagnosis not present

## 2016-06-28 DIAGNOSIS — J9621 Acute and chronic respiratory failure with hypoxia: Secondary | ICD-10-CM | POA: Diagnosis present

## 2016-06-28 DIAGNOSIS — Z8249 Family history of ischemic heart disease and other diseases of the circulatory system: Secondary | ICD-10-CM | POA: Diagnosis not present

## 2016-06-28 DIAGNOSIS — K5792 Diverticulitis of intestine, part unspecified, without perforation or abscess without bleeding: Secondary | ICD-10-CM | POA: Diagnosis not present

## 2016-06-28 DIAGNOSIS — I5032 Chronic diastolic (congestive) heart failure: Secondary | ICD-10-CM | POA: Diagnosis not present

## 2016-06-28 DIAGNOSIS — R6889 Other general symptoms and signs: Secondary | ICD-10-CM | POA: Diagnosis not present

## 2016-06-28 DIAGNOSIS — I110 Hypertensive heart disease with heart failure: Secondary | ICD-10-CM | POA: Diagnosis not present

## 2016-06-28 DIAGNOSIS — Z7951 Long term (current) use of inhaled steroids: Secondary | ICD-10-CM | POA: Diagnosis not present

## 2016-06-28 DIAGNOSIS — I509 Heart failure, unspecified: Secondary | ICD-10-CM | POA: Diagnosis not present

## 2016-06-28 DIAGNOSIS — J9611 Chronic respiratory failure with hypoxia: Secondary | ICD-10-CM | POA: Diagnosis not present

## 2016-06-28 DIAGNOSIS — Z79899 Other long term (current) drug therapy: Secondary | ICD-10-CM | POA: Diagnosis not present

## 2016-06-28 DIAGNOSIS — I10 Essential (primary) hypertension: Secondary | ICD-10-CM | POA: Diagnosis not present

## 2016-06-28 DIAGNOSIS — Z87891 Personal history of nicotine dependence: Secondary | ICD-10-CM | POA: Diagnosis not present

## 2016-06-28 DIAGNOSIS — Z885 Allergy status to narcotic agent status: Secondary | ICD-10-CM | POA: Diagnosis not present

## 2016-06-28 DIAGNOSIS — E876 Hypokalemia: Secondary | ICD-10-CM | POA: Diagnosis present

## 2016-06-28 DIAGNOSIS — G4730 Sleep apnea, unspecified: Secondary | ICD-10-CM | POA: Diagnosis not present

## 2016-06-28 NOTE — Discharge Summary (Addendum)
[facility] [place] at [facility]   PATIENT NAME: Dustin [name]    MR#:  [id number]  DATE OF BIRTH:  04-01-1956  DATE OF ADMISSION:  06/22/2016 ADMITTING PHYSICIAN: Oralia Manis, MD  DATE OF DISCHARGE: 06/28/16  PRIMARY CARE PHYSICIAN: Lyndon Code, MD    ADMISSION DIAGNOSIS:  COPD exacerbation (HCC) [J44.1] Acute respiratory failure with hypoxia (HCC) [J96.01]  DISCHARGE DIAGNOSIS:  Principal Problem: Acute on chronic respiratory with hypoxia   COPD exacerbation (HCC) Active Problems: chronic diastolic CHF (congestive heart failure) (HCC)   Anxiety   HTN (hypertension)   SECONDARY DIAGNOSIS:   Past Medical History:  Diagnosis Date  . Allergy   . Anxiety   . Anxiety   . Asthma   . Chronic diastolic CHF (congestive heart failure) (HCC)    a. echo 07/2013: EF 60-65%, DD, biatrial dilatation, Ao sclerosis, dilated RV, moderate pulmonary HTN, elevated CV and RA pressures; b. patient reported echo at Dr. Milta Deiters office 02/2015 - his office does not have record of him being a pt there c. echo 11/2015: EF 60-65%, Grade 1 DD, mod-severe pulm pressures  . Chronic respiratory failure (HCC)    a. on 2L via nasal cannula; b. secondary to COPD  . COPD (chronic obstructive pulmonary disease) (HCC)   . Depression   . Depression   . Depression   . Emphysema of lung (HCC)   . GERD (gastroesophageal reflux disease)   . Hypertension   . Personal history of tobacco use, presenting hazards to health 08/17/2015  . Tobacco abuse     HOSPITAL COURSE:  Dustin [name]  is a 60 y.o. male admitted 06/22/2016 with chief complaint Cough and Shortness of Breath . Please see H&P performed by Oralia Manis, MD for further information. Patient presented with the above symptoms requiring higher than baseline oxygen. After receiving antibiotics for anti-inflammation, breathing treatments, steroids he is back to his baseline oxygen requirement. PT eval recommend SNF  DISCHARGE  CONDITIONS:   Stable  CONSULTS OBTAINED:    DRUG ALLERGIES:   Allergies  Allergen Reactions  . Asa [Aspirin] Other (See Comments)    Reaction: swelling of the right side.  . Codeine Other (See Comments)    Reaction: Difficulty breathing  . Ibuprofen Other (See Comments)    Reaction: Swelling of the right side.    DISCHARGE MEDICATIONS:   Current Discharge Medication List    START taking these medications   Details  predniSONE (STERAPRED UNI-PAK 21 TAB) 10 MG (21) TBPK tablet 40mg  oral 1 day, then 20mg  oral for 2 days, then 10mg  oral 2 days, then stop Qty: 10 tablet, Refills: 0      CONTINUE these medications which have NOT CHANGED   Details  Albuterol Sulfate (PROAIR RESPICLICK) 108 (90 BASE) MCG/ACT AEPB Inhale 2 puffs into the lungs every 4 (four) hours as needed (for shortness of breath). Reported on 12/10/2015    ALPRAZolam (XANAX) 1 MG tablet Take 1 tablet (1 mg total) by mouth 3 (three) times daily as needed for anxiety. Qty: 15 tablet, Refills: 0    escitalopram (LEXAPRO) 20 MG tablet Take 20 mg by mouth at bedtime.     furosemide (LASIX) 20 MG tablet Take 20 mg by mouth.    gabapentin (NEURONTIN) 600 MG tablet Take 600 mg by mouth 3 (three) times daily.    ipratropium-albuterol (DUONEB) 0.5-2.5 (3) MG/3ML SOLN Take 3 mLs by nebulization 4 (four) times daily as needed (for shortness of breath).  mometasone-formoterol (DULERA) 200-5 MCG/ACT AERO Inhale 1 puff into the lungs 2 (two) times daily.     potassium chloride (K-DUR,KLOR-CON) 10 MEQ tablet Take 10 mEq by mouth once.    theophylline (UNIPHYL) 400 MG 24 hr tablet Take 400 mg by mouth 2 (two) times daily.    Tiotropium Bromide Monohydrate (SPIRIVA RESPIMAT) 2.5 MCG/ACT AERS Inhale 2 puffs into the lungs daily.          DISCHARGE INSTRUCTIONS:   CPAP at nighttime, 12 DIET:  Cardiac diet  DISCHARGE CONDITION:  Stable  ACTIVITY:  Activity as tolerated  OXYGEN:  Home Oxygen: Yes.       Oxygen Delivery: 3 liters/min via Patient connected to nasal cannula oxygen  DISCHARGE LOCATION:  snf  If you experience worsening of your admission symptoms, develop shortness of breath, life threatening emergency, suicidal or homicidal thoughts you must seek medical attention immediately by calling 911 or calling your MD immediately  if symptoms less severe.  You Must read complete instructions/literature along with all the possible adverse reactions/side effects for all the Medicines you take and that have been prescribed to you. Take any new Medicines after you have completely understood and accpet all the possible adverse reactions/side effects.   Please note  You were cared for by a hospitalist during your hospital stay. If you have any questions about your discharge medications or the care you received while you were in the hospital after you are discharged, you can call the unit and asked to speak with the hospitalist on call if the hospitalist that took care of you is not available. Once you are discharged, your primary care physician will handle any further medical issues. Please note that NO REFILLS for any discharge medications will be authorized once you are discharged, as it is imperative that you return to your primary care physician (or establish a relationship with a primary care physician if you do not have one) for your aftercare needs so that they can reassess your need for medications and monitor your lab values.    On the day of Discharge:   VITAL SIGNS:  Blood pressure 138/74, pulse 91, temperature 97.9 F (36.6 C), temperature source Oral, resp. rate 18, height 5\' 10"  (1.778 m), weight 96.6 kg (213 lb), SpO2 92 %.  I/O:    Intake/Output Summary (Last 24 hours) at 06/28/16 1101 Last data filed at 06/28/16 0945  Gross per 24 hour  Intake              960 ml  Output             1525 ml  Net             -565 ml    PHYSICAL EXAMINATION:  GENERAL:  60  y.o.-year-old patient lying in the bed with no acute distress.  EYES: Pupils equal, round, reactive to light and accommodation. No scleral icterus. Extraocular muscles intact.  HEENT: Head atraumatic, normocephalic. Oropharynx and nasopharynx clear.  NECK:  Supple, no jugular venous distention. No thyroid enlargement, no tenderness.  LUNGS: Normal breath sounds bilaterally, no wheezing, rales,rhonchi or crepitation. No use of accessory muscles of respiration.  CARDIOVASCULAR: S1, S2 normal. No murmurs, rubs, or gallops.  ABDOMEN: Soft, non-tender, non-distended. Bowel sounds present. No organomegaly or mass.  EXTREMITIES: No pedal edema, cyanosis, or clubbing.  NEUROLOGIC: Cranial nerves II through XII are intact. Muscle strength 5/5 in all extremities. Sensation intact. Gait not checked.  PSYCHIATRIC: The patient is alert and oriented  x 3.  SKIN: No obvious rash, lesion, or ulcer.   DATA REVIEW:   CBC  Recent Labs Lab 06/27/16 0517  WBC 14.2*  HGB 13.8  HCT 42.2  PLT 312    Chemistries   Recent Labs Lab 06/23/16 0649 06/27/16 0517  NA 143  --   K 4.9  --   CL 104  --   CO2 34*  --   GLUCOSE 158*  --   BUN 11  --   CREATININE 1.17 1.22  CALCIUM 9.0  --     Cardiac Enzymes  Recent Labs Lab 06/23/16 1142  TROPONINI <0.03    Microbiology Results  Results for orders placed or performed during the hospital encounter of 06/22/16  MRSA PCR Screening     Status: None   Collection Time: 06/22/16 11:59 PM  Result Value Ref Range Status   MRSA by PCR NEGATIVE NEGATIVE Final    Comment:        The GeneXpert MRSA Assay (FDA approved for NASAL specimens only), is one component of a comprehensive MRSA colonization surveillance program. It is not intended to diagnose MRSA infection nor to guide or monitor treatment for MRSA infections.   Culture, expectorated sputum-assessment     Status: None   Collection Time: 06/24/16  4:20 AM  Result Value Ref Range Status    Specimen Description EXPECTORATED SPUTUM  Final   Special Requests NONE  Final   Sputum evaluation THIS SPECIMEN IS ACCEPTABLE FOR SPUTUM CULTURE  Final   Report Status 06/24/2016 FINAL  Final  Culture, respiratory (NON-Expectorated)     Status: None   Collection Time: 06/24/16  4:20 AM  Result Value Ref Range Status   Specimen Description EXPECTORATED SPUTUM  Final   Special Requests NONE Reflexed from [id number]  Final   Gram Stain   Final    FEW WBC PRESENT,BOTH PMN AND MONONUCLEAR FEW SQUAMOUS EPITHELIAL CELLS PRESENT RARE BUDDING YEAST SEEN RARE GRAM POSITIVE COCCI IN PAIRS RARE GRAM NEGATIVE COCCI IN PAIRS    Culture   Final    Consistent with normal respiratory flora. Performed at [facility]    Report Status 06/26/2016 FINAL  Final    RADIOLOGY:  No results found.   Management plans discussed with the patient, family and they are in agreement.  CODE STATUS:     Code Status Orders        Start     Ordered   06/23/16 0002  Full code  Continuous     06/23/16 0001    Code Status History    Date Active Date Inactive Code Status Order ID Comments User Context   03/25/2016  5:03 PM 03/31/2016 11:08 PM Full Code [id number]  Standley Brooking, MD Inpatient   11/30/2015  5:12 AM 12/06/2015  9:34 PM Full Code [id number]  Arnaldo Natal, MD Inpatient   04/13/2015  5:22 PM 04/21/2015  7:40 PM Full Code [id number]  Auburn Bilberry, MD Inpatient   03/31/2015  8:50 PM 04/05/2015  8:48 PM Full Code [id number]  Wyatt Haste, MD ED      TOTAL TIME TAKING CARE OF THIS PATIENT: 33 minutes.    Maddox Bratcher,  Mardi Mainland.D on 06/28/2016 at 11:01 AM  Between 7am to 6pm - Pager - [phone]  After 6pm go to [email or web address] - Scientist, research (life sciences) [facility]  Office  [phone]  CC: Primary care physician; Lyndon Code, MD

## 2016-06-28 NOTE — Evaluation (Signed)
Physical Therapy Evaluation Patient Details Name: Dustin CoriaDennis R [name] MRN: [id number] DOB: [birth year] Today's Date: 06/28/2016   History of Present Illness  Pt is a 60 y/o M who presented with aggresively worsening SOB and productive cough. He was started on BiPAP, chest x-ray did not show significant pneumonia or heart failure.  Admitting dx: COPD exacerbation.  Pt's PMH includes anxiety, chronic diastolic CHF, chronic respiratory failure, COPD, depression, abdominal surgery.    Clinical Impression  Pt admitted with above diagnosis. Pt currently with functional limitations due to the deficits listed below (see PT Problem List). Dustin [name] presents with increased WOB, SOB, and anxiety.  He ambulated 50 ft on 3L O2 with SpO2 dropping to 88% despite pursed lip breathing.  Pt reports feeling anxious about mobility and HR up to 144 while ambulating.  MD present at end of session and was made aware of SpO2 and HR.  Pt lives with son and daughter in law at home but will not have any assist available during the day.  Recommending SNF at d/c given pt's current mobility status. Pt will benefit from skilled PT to increase their independence and safety with mobility to allow discharge to the venue listed below.      Follow Up Recommendations SNF;Supervision/Assistance - 24 hour    Equipment Recommendations  None recommended by PT    Recommendations for Other Services Other (comment) (Cardiopulmonary rehab)     Precautions / Restrictions Precautions Precautions: Other (comment) Precaution Comments: Monitor O2 and HR.  Was O2 dependent PTA (2L) but now requiring 3L Restrictions Weight Bearing Restrictions: No      Mobility  Bed Mobility Overal bed mobility: Modified Independent             General bed mobility comments: Increased time and effort  Transfers Overall transfer level: Needs assistance   Transfers: Sit to/from Stand Sit to Stand: Supervision         General transfer  comment: Supervision for safety as pt unsteady.    Ambulation/Gait Ambulation/Gait assistance: Min guard Ambulation Distance (Feet): 50 Feet Assistive device: None Gait Pattern/deviations: Step-through pattern;Decreased stride length;Drifts right/left Gait velocity: decreased   General Gait Details: Pt drifting L/R when not holding onto armrail which pt reaches out for. He becomes anxious while ambulating "I need to turn around because I am getting anxious".  HR up to 144, SpO2 down to 88% and fluctuating between 88-93% on 3L O2.  Cues for pursed lip breathing which pt demonstrates well.  Stairs            Wheelchair Mobility    Modified Rankin (Stroke Patients Only)       Balance Overall balance assessment: Needs assistance Sitting-balance support: No upper extremity supported;Feet supported Sitting balance-Leahy Scale: Fair Sitting balance - Comments: Increased WOB, SpO2 fluctuating between 88-93%   Standing balance support: No upper extremity supported;During functional activity Standing balance-Leahy Scale: Poor Standing balance comment: Pt reaching out for armrail and drifts L/R during dynamic activity without UE support                             Pertinent Vitals/Pain Pain Assessment: No/denies pain    Home Living Family/patient expects to be discharged to:: Private residence Living Arrangements: Children Available Help at Discharge: Family;Available PRN/intermittently Type of Home: House Home Access: Stairs to enter Entrance Stairs-Rails: Can reach both Entrance Stairs-Number of Steps: 3  Home Layout: One level Home Equipment: Walker - 2 wheels;Shower  seat;Grab bars - tub/shower Additional Comments: Pt lives with his son and daughter in Social worker.  Daughter in law just returned home from [facility] after multiple surgeries and will not be able to assist pt at home. Son is a Naval architect and is gone during the day.    Prior Function Level of Independence:  Independent               Hand Dominance        Extremity/Trunk Assessment   Upper Extremity Assessment: Overall WFL for tasks assessed           Lower Extremity Assessment: Overall WFL for tasks assessed      Cervical / Trunk Assessment: Normal  Communication   Communication: No difficulties  Cognition Arousal/Alertness: Awake/alert Behavior During Therapy: Anxious;WFL for tasks assessed/performed Overall Cognitive Status: Within Functional Limits for tasks assessed                      General Comments General comments (skin integrity, edema, etc.): MD (Dr. Dimas Aguas) present at end of session and was notified of pt's HR and SpO2.    Exercises General Exercises - Lower Extremity Ankle Circles/Pumps: AROM;Both;10 reps;Seated Other Exercises Other Exercises: Encouraged pt to ambulate with nursing staff at least 3x/day Other Exercises: Cues for pursed lip breathing and encouraged pt to continue throughout the day Other Exercises: Demonstrated to pt and pt performed scapular retraction and upright sitting while sitting EOB and in chair at end of session   Assessment/Plan    PT Assessment Patient needs continued PT services  PT Problem List Decreased activity tolerance;Decreased balance;Decreased knowledge of use of DME;Decreased safety awareness;Decreased knowledge of precautions;Cardiopulmonary status limiting activity          PT Treatment Interventions DME instruction;Gait training;Stair training;Functional mobility training;Therapeutic activities;Therapeutic exercise;Balance training;Patient/family education    PT Goals (Current goals can be found in the Care Plan section)  Acute Rehab PT Goals Patient Stated Goal: rehab before home PT Goal Formulation: With patient Time For Goal Achievement: 07/05/16 Potential to Achieve Goals: Good    Frequency Min 2X/week   Barriers to discharge Decreased caregiver support No assist during the day     Co-evaluation               End of Session Equipment Utilized During Treatment: Gait belt;Oxygen Activity Tolerance: Other (comment);Treatment limited secondary to medical complications (Comment);Patient limited by fatigue (limited by anxiety, SpO2, HR) Patient left: in chair;with call bell/phone within reach;with chair alarm set Nurse Communication: Mobility status;Other (comment) (SpO2, HR, anxiety)         Time: 6578-4696 PT Time Calculation (min) (ACUTE ONLY): 26 min   Charges:   PT Evaluation $PT Eval Moderate Complexity: 1 Procedure PT Treatments $Gait Training: 8-22 mins   PT G Codes:        Encarnacion Chu PT, DPT 06/28/2016, 9:32 AM

## 2016-06-28 NOTE — NC FL2 (Signed)
[place] MEDICAID FL2 LEVEL OF CARE SCREENING TOOL     IDENTIFICATION  Patient Name: Dustin [name] Birthdate: October 28, 1955 Sex: male Admission Date (Current Location): 06/22/2016  [place] and [place] Number:  Chiropodist and Address:  [facility], [address] [place], [place] [address]      Provider Number: [id number]  Attending Physician Name and Address:  Wyatt Haste, MD  Relative Name and Phone Number:       Current Level of Care: Hospital Recommended Level of Care: Skilled Nursing Facility Prior Approval Number:    Date Approved/Denied:   PASRR Number:  [id number])  Discharge Plan: SNF    Current Diagnoses: Patient Active Problem List   Diagnosis Date Noted  . Anxiety 06/22/2016  . HTN (hypertension) 06/22/2016  . Acute respiratory failure with hypoxia (HCC) 03/25/2016  . Tobacco use disorder 03/25/2016  . Acute on chronic respiratory failure with hypercapnia (HCC)   . Endotracheally intubated   . Chest pain with low risk of acute coronary syndrome 11/30/2015  . Chronic diastolic heart failure (HCC) 11/30/2015  . Sinus tachycardia seen on cardiac monitor 11/30/2015  . Hypercapnic respiratory failure (HCC) 11/30/2015  . Pulmonary hypertension   . Centrilobular emphysema (HCC)   . Lethargy   . Personal history of tobacco use, presenting hazards to health 08/17/2015  . Pressure ulcer 04/19/2015  . Acute on chronic diastolic CHF (congestive heart failure) (HCC)   . COPD exacerbation (HCC) 03/31/2015    Orientation RESPIRATION BLADDER Height & Weight     Self, Time, Situation, Place  O2 (3 Liters Oxygen ) Continent Weight: 213 lb (96.6 kg) Height:  5\' 10"  (177.8 cm)  BEHAVIORAL SYMPTOMS/MOOD NEUROLOGICAL BOWEL NUTRITION STATUS   (none )  (none) Continent Diet (Diet: Heart Healthy )  AMBULATORY STATUS COMMUNICATION OF NEEDS Skin   Extensive Assist Verbally Normal                       Personal Care  Assistance Level of Assistance  Bathing, Feeding, Dressing Bathing Assistance: Limited assistance Feeding assistance: Independent Dressing Assistance: Limited assistance     Functional Limitations Info  Sight, Hearing, Speech Sight Info: Adequate Hearing Info: Adequate Speech Info: Adequate    SPECIAL CARE FACTORS FREQUENCY  PT (By licensed PT), OT (By licensed OT)     PT Frequency:  (5) OT Frequency:  (5)            Contractures      Additional Factors Info  Code Status, Allergies, Psychotropic, Isolation Precautions Code Status Info:  (Full Code. ) Allergies Info:  (Asa Aspirin, Codeine, Ibuprofen) Psychotropic Info:  (Lexapro )   Isolation Precautions Info:  (History of MRSA )     Current Medications (06/28/2016):  This is the current hospital active medication list Current Facility-Administered Medications  Medication Dose Route Frequency Provider Last Rate Last Dose  . acetaminophen (TYLENOL) tablet 650 mg  650 mg Oral Q6H PRN Oralia Manis, MD       Or  . acetaminophen (TYLENOL) suppository 650 mg  650 mg Rectal Q6H PRN Oralia Manis, MD      . ALPRAZolam Prudy Feeler) tablet 1 mg  1 mg Oral TID PRN Oralia Manis, MD   1 mg at 06/28/16 5621  . chlorhexidine (PERIDEX) 0.12 % solution 15 mL  15 mL Mouth Rinse BID Oralia Manis, MD   15 mL at 06/27/16 2115  . docusate sodium (COLACE) capsule 100 mg  100 mg  Oral BID Wyatt Hasteavid K Allan Minotti, MD      . enoxaparin (LOVENOX) injection 40 mg  40 mg Subcutaneous Q24H Oralia Manisavid Willis, MD   40 mg at 06/27/16 2115  . escitalopram (LEXAPRO) tablet 20 mg  20 mg Oral QHS Oralia Manisavid Willis, MD   20 mg at 06/27/16 2115  . furosemide (LASIX) tablet 20 mg  20 mg Oral Daily Oralia Manisavid Willis, MD   20 mg at 06/27/16 0900  . gabapentin (NEURONTIN) tablet 600 mg  600 mg Oral TID Oralia Manisavid Willis, MD   600 mg at 06/27/16 2115  . guaiFENesin (ROBITUSSIN) 100 MG/5ML solution 100 mg  5 mL Oral Q6H PRN Altamese DillingVaibhavkumar Vachhani, MD   100 mg at 06/23/16 1145  . levalbuterol  (XOPENEX) nebulizer solution 1.25 mg  1.25 mg Nebulization Q6H PRN Oralia Manisavid Willis, MD   1.25 mg at 06/27/16 2350  . MEDLINE mouth rinse  15 mL Mouth Rinse q12n4p Oralia Manisavid Willis, MD   15 mL at 06/26/16 1655  . methylPREDNISolone sodium succinate (SOLU-MEDROL) 125 mg/2 mL injection 60 mg  60 mg Intravenous Q24H Wyatt Hasteavid K Angalina Ante, MD   60 mg at 06/27/16 1117  . mometasone-formoterol (DULERA) 200-5 MCG/ACT inhaler 1 puff  1 puff Inhalation BID Oralia Manisavid Willis, MD   1 puff at 06/27/16 2115  . ondansetron (ZOFRAN) tablet 4 mg  4 mg Oral Q6H PRN Oralia Manisavid Willis, MD       Or  . ondansetron [facility](ZOFRAN) injection 4 mg  4 mg Intravenous Q6H PRN Oralia Manisavid Willis, MD      . polyethylene glycol (MIRALAX / GLYCOLAX) packet 17 g  17 g Oral Daily PRN Wyatt Hasteavid K Rithika Seel, MD      . sodium chloride flush (NS) 0.9 % injection 3 mL  3 mL Intravenous Q12H Oralia Manisavid Willis, MD   3 mL at 06/27/16 2116  . sodium chloride flush (NS) 0.9 % injection 3 mL  3 mL Intravenous PRN Altamese DillingVaibhavkumar Vachhani, MD   3 mL at 06/25/16 1736  . theophylline (THEO-24) 24 hr capsule 400 mg  400 mg Oral Q12H Oralia Manisavid Willis, MD   400 mg at 06/27/16 2115     Discharge Medications: Please see discharge summary for a list of discharge medications.  Relevant Imaging Results:  Relevant Lab Results:   Additional Information  (SSN: [id number])  Sample, [name], [name]

## 2016-06-28 NOTE — Clinical Social Work Placement (Signed)
   CLINICAL SOCIAL WORK PLACEMENT  NOTE  Date:  06/28/2016  Patient Details  Name: Dustin CoriaDennis R Regala MRN: [id number] Date of Birth: 01-Feb-1956  Clinical Social Work is seeking post-discharge placement for this patient at the Skilled  Nursing Facility level of care (*CSW will initial, date and re-position this form in  chart as items are completed):  Yes   Patient/family provided with [facility] Clinical Social Work Department's list of facilities offering this level of care within the geographic area requested by the patient (or if unable, by the patient's family).  Yes   Patient/family informed of their freedom to choose among providers that offer the needed level of care, that participate in Medicare, Medicaid or managed care program needed by the patient, have an available bed and are willing to accept the patient.  Yes   Patient/family informed of [facility]'s ownership interest in [facility] and [facility], as well as of the fact that they are under no obligation to receive care at these facilities.  PASRR submitted to EDS on       PASRR number received on       Existing PASRR number confirmed on 06/28/16     FL2 transmitted to all facilities in geographic area requested by pt/family on 06/28/16     FL2 transmitted to all facilities within larger geographic area on       Patient informed that his/her managed care company has contracts with or will negotiate with certain facilities, including the following:        Yes   Patient/family informed of bed offers received.  Patient chooses bed at  [facility])     Physician recommends and patient chooses bed at      Patient to be transferred to  [facility]) on 06/28/16.  Patient to be transferred to facility by  (EMS)     Patient family notified on   of transfer.  Name of family member notified:   (Patient stated he'd call his family)     PHYSICIAN       Additional Comment:     _______________________________________________ Idamae Lusherhristina E Lajuanda Penick, [name] 06/28/2016, 11:21 AM

## 2016-06-28 NOTE — Progress Notes (Signed)
CSW presented bed offers. Accepted [facility]. CSW requested patient bring his CPAP from home. Patient reported he'd ask his family if they can retreive it. Informed CSW that he gets [facility] from [facility]. CSW called Doristine JohnsBarbra with [facility]. Requested CPAP settings. Per Timmie FoersterBarba patient's CPAP setting is 12. CSW informed Marcelino DusterMichelle for [facility] of above. She reported she'd order patient at CPAP just in case he doesn't retreive his from home.  CSW inquired about how patient would like to be transported. Patient reported EMS is an appropriate form of transportation. Informed him that there may be a co-pay.   CSW was informed that patient will be medically ready to discharge to [facility]. Patient in a agreement with plan. CSW called Marcelino DusterMichelle- Admissions Coordinator at [facility] to confirm that patient's bed is ready. Patient's bed will not be ready until 2PM Provided patient's room number 208 and number to call for report [phone] . All discharge information faxed to [facility] via [facility]UB.    RN will call report and patient will discharge to [facility] via EMS  Woodroe Modehristina Rogelio Waynick, MSW, [name], LCAS-A Clinical Social Worker [phone]

## 2016-06-28 NOTE — Progress Notes (Signed)
Pt rec snf, will plan dc today to snf if possible

## 2016-06-28 NOTE — Progress Notes (Signed)
PT is recommending SNF today. Patient reported that he [facility] and would like to stay in [place] closer to his family. FL2 complete and faxed out. Facilities are reviewing the referral. Clinical Social Worker (CSW) will continue to follow and assist as needed.   Baker [employer] IncorporatedBailey Ichiro Chesnut, [name] [phone]

## 2016-06-28 NOTE — Care Management Important Message (Signed)
Important Message  Patient Details  Name: Dustin CoriaDennis R Gruetzmacher MRN: [id number] Date of Birth: 1955/10/05   Medicare Important Message Given:  Yes    Gwenette GreetBrenda S Magalie Almon, RN 06/28/2016, 12:04 PM

## 2016-06-28 NOTE — Progress Notes (Signed)
CPAP at nighttime, 12 Added to discharge summary

## 2016-06-28 NOTE — Clinical Social Work Placement (Signed)
   CLINICAL SOCIAL WORK PLACEMENT  NOTE  Date:  06/28/2016  Patient Details  Name: Hershal CoriaDennis R Abrell MRN: [id number] Date of Birth: 02/15/1956  Clinical Social Work is seeking post-discharge placement for this patient at the Skilled  Nursing Facility level of care (*CSW will initial, date and re-position this form in  chart as items are completed):  Yes   Patient/family provided with [facility] Clinical Social Work Department's list of facilities offering this level of care within the geographic area requested by the patient (or if unable, by the patient's family).  Yes   Patient/family informed of their freedom to choose among providers that offer the needed level of care, that participate in Medicare, Medicaid or managed care program needed by the patient, have an available bed and are willing to accept the patient.  Yes   Patient/family informed of [facility]'s ownership interest in [facility] and [facility], as well as of the fact that they are under no obligation to receive care at these facilities.  PASRR submitted to EDS on       PASRR number received on       Existing PASRR number confirmed on 06/28/16     FL2 transmitted to all facilities in geographic area requested by pt/family on 06/28/16     FL2 transmitted to all facilities within larger geographic area on       Patient informed that his/her managed care company has contracts with or will negotiate with certain facilities, including the following:            Patient/family informed of bed offers received.  Patient chooses bed at       Physician recommends and patient chooses bed at      Patient to be transferred to   on  .  Patient to be transferred to facility by       Patient family notified on   of transfer.  Name of family member notified:        PHYSICIAN       Additional Comment:    _______________________________________________ Adiah Guereca, [name], [name] 06/28/2016, 9:54 AM

## 2016-06-28 NOTE — Progress Notes (Signed)
Patient d/ced [facility] for [facility] and to be weaned down to 2L of O2 which is his baseline.  Pt wked w/PT today.  They recommend he walk with a walker b/c he was holding on to rail.  He desatted down to 88% while ambulating and sitting on the side of the bed.  Called report to [place] [facility].  He will go via EMS.  IV removed from R. Shoulder.

## 2016-06-28 NOTE — Progress Notes (Signed)
CSW requested MD to write CPAP orders per [facility] request.  Woodroe Modehristina Nanda Bittick, MSW, [name], LCAS-A Clinical Social Worker [phone]

## 2016-06-29 DIAGNOSIS — J449 Chronic obstructive pulmonary disease, unspecified: Secondary | ICD-10-CM | POA: Diagnosis not present

## 2016-06-29 DIAGNOSIS — I10 Essential (primary) hypertension: Secondary | ICD-10-CM | POA: Diagnosis not present

## 2016-06-29 DIAGNOSIS — K5792 Diverticulitis of intestine, part unspecified, without perforation or abscess without bleeding: Secondary | ICD-10-CM | POA: Diagnosis not present

## 2016-06-29 DIAGNOSIS — I509 Heart failure, unspecified: Secondary | ICD-10-CM | POA: Diagnosis not present

## 2016-06-29 LAB — CBC WITH DIFFERENTIAL/PLATELET
BASOS PCT: 0 %
Basophils Absolute: 0 10*3/uL (ref 0–0.1)
Eosinophils Absolute: 0.1 10*3/uL (ref 0–0.7)
Eosinophils Relative: 0 %
HEMATOCRIT: 42.9 % (ref 40.0–52.0)
Hemoglobin: 14.3 g/dL (ref 13.0–18.0)
LYMPHS PCT: 4 %
Lymphs Abs: 0.8 10*3/uL — ABNORMAL LOW (ref 1.0–3.6)
MCH: 32.1 pg (ref 26.0–34.0)
MCHC: 33.4 g/dL (ref 32.0–36.0)
MCV: 96.2 fL (ref 80.0–100.0)
MONO ABS: 0.6 10*3/uL (ref 0.2–1.0)
MONOS PCT: 3 %
NEUTROS ABS: 18 10*3/uL — AB (ref 1.4–6.5)
Neutrophils Relative %: 93 %
Platelets: 287 10*3/uL (ref 150–440)
RBC: 4.46 MIL/uL (ref 4.40–5.90)
RDW: 13.7 % (ref 11.5–14.5)
WBC: 19.5 10*3/uL — ABNORMAL HIGH (ref 3.8–10.6)

## 2016-07-03 ENCOUNTER — Ambulatory Visit: Payer: Medicare Other | Admitting: Cardiovascular Disease

## 2016-07-06 DIAGNOSIS — J432 Centrilobular emphysema: Secondary | ICD-10-CM | POA: Diagnosis not present

## 2016-07-06 DIAGNOSIS — I5032 Chronic diastolic (congestive) heart failure: Secondary | ICD-10-CM | POA: Diagnosis not present

## 2016-07-06 DIAGNOSIS — J9611 Chronic respiratory failure with hypoxia: Secondary | ICD-10-CM | POA: Diagnosis not present

## 2016-07-16 DIAGNOSIS — Z9981 Dependence on supplemental oxygen: Secondary | ICD-10-CM | POA: Diagnosis not present

## 2016-07-16 DIAGNOSIS — I2510 Atherosclerotic heart disease of native coronary artery without angina pectoris: Secondary | ICD-10-CM | POA: Diagnosis not present

## 2016-07-16 DIAGNOSIS — J9611 Chronic respiratory failure with hypoxia: Secondary | ICD-10-CM | POA: Diagnosis not present

## 2016-07-16 DIAGNOSIS — J449 Chronic obstructive pulmonary disease, unspecified: Secondary | ICD-10-CM | POA: Diagnosis not present

## 2016-07-16 DIAGNOSIS — I5032 Chronic diastolic (congestive) heart failure: Secondary | ICD-10-CM | POA: Diagnosis not present

## 2016-07-16 DIAGNOSIS — I110 Hypertensive heart disease with heart failure: Secondary | ICD-10-CM | POA: Diagnosis not present

## 2016-07-16 DIAGNOSIS — Z7951 Long term (current) use of inhaled steroids: Secondary | ICD-10-CM | POA: Diagnosis not present

## 2016-07-18 DIAGNOSIS — J9611 Chronic respiratory failure with hypoxia: Secondary | ICD-10-CM | POA: Diagnosis not present

## 2016-07-18 DIAGNOSIS — I110 Hypertensive heart disease with heart failure: Secondary | ICD-10-CM | POA: Diagnosis not present

## 2016-07-18 DIAGNOSIS — Z7951 Long term (current) use of inhaled steroids: Secondary | ICD-10-CM | POA: Diagnosis not present

## 2016-07-18 DIAGNOSIS — Z9981 Dependence on supplemental oxygen: Secondary | ICD-10-CM | POA: Diagnosis not present

## 2016-07-18 DIAGNOSIS — I2510 Atherosclerotic heart disease of native coronary artery without angina pectoris: Secondary | ICD-10-CM | POA: Diagnosis not present

## 2016-07-18 DIAGNOSIS — J449 Chronic obstructive pulmonary disease, unspecified: Secondary | ICD-10-CM | POA: Diagnosis not present

## 2016-07-18 DIAGNOSIS — I5032 Chronic diastolic (congestive) heart failure: Secondary | ICD-10-CM | POA: Diagnosis not present

## 2016-07-19 ENCOUNTER — Encounter: Admission: RE | Admit: 2016-07-19 | Payer: Medicare Other | Source: Ambulatory Visit | Admitting: Internal Medicine

## 2016-07-21 ENCOUNTER — Other Ambulatory Visit: Payer: Self-pay | Admitting: Pharmacist

## 2016-07-21 ENCOUNTER — Other Ambulatory Visit: Payer: Self-pay | Admitting: *Deleted

## 2016-07-21 DIAGNOSIS — J9611 Chronic respiratory failure with hypoxia: Secondary | ICD-10-CM | POA: Diagnosis not present

## 2016-07-21 DIAGNOSIS — Z7951 Long term (current) use of inhaled steroids: Secondary | ICD-10-CM | POA: Diagnosis not present

## 2016-07-21 DIAGNOSIS — I110 Hypertensive heart disease with heart failure: Secondary | ICD-10-CM | POA: Diagnosis not present

## 2016-07-21 DIAGNOSIS — I5032 Chronic diastolic (congestive) heart failure: Secondary | ICD-10-CM | POA: Diagnosis not present

## 2016-07-21 DIAGNOSIS — Z9981 Dependence on supplemental oxygen: Secondary | ICD-10-CM | POA: Diagnosis not present

## 2016-07-21 DIAGNOSIS — J449 Chronic obstructive pulmonary disease, unspecified: Secondary | ICD-10-CM | POA: Diagnosis not present

## 2016-07-21 DIAGNOSIS — I2510 Atherosclerotic heart disease of native coronary artery without angina pectoris: Secondary | ICD-10-CM | POA: Diagnosis not present

## 2016-07-21 NOTE — Patient Outreach (Signed)
[name]) Care Management  [facility]   07/21/2016  Dustin CoriaDennis R [name] Nov 08, 1955 [id number]  Late entry for 07/20/16.    Subjective:  Received an inbound phone call from Dustin [name] on 07/20/16 ~1032, stating that he was admitted at [facility] in October and had just returned home from short term rehab facility.  He verified HIPAA details.    Patient was willing to review his current medications over the phone with [facility] Pharmacist and was requesting [facility] Pharmacist let [facility] RN Community, RolfeAlisa, know that he was admitted and is now home.  Serina CowperAlisa, [facility] RN had previously worked with patient on his nursing needs.   Patient denies medication related concerns at this time.  He reports he is still receiving his Spiriva and Dulera inhalers from manufacturer patient assistance programs.    Patient has a PMH significant for diastolic heart failure, hypertension, COPD, anxiety.  Per review of notes in chart, appears he was admitted 06/22/16-06/28/16 to [facility] for COPD exacerbation.    Objective:   Current Medications: Current Outpatient Prescriptions  Medication Sig Dispense Refill  . Albuterol Sulfate (PROAIR RESPICLICK) 108 (90 BASE) MCG/ACT AEPB Inhale 2 puffs into the lungs every 4 (four) hours as needed (for shortness of breath). Reported on 12/10/2015    . ALPRAZolam (XANAX) 1 MG tablet Take 1 tablet (1 mg total) by mouth 3 (three) times daily as needed for anxiety. 15 tablet 0  . escitalopram (LEXAPRO) 20 MG tablet Take 20 mg by mouth at bedtime.     . furosemide (LASIX) 20 MG tablet Take 20 mg by mouth.    . gabapentin (NEURONTIN) 600 MG tablet Take 600 mg by mouth 3 (three) times daily.    Marland Kitchen. ipratropium-albuterol (DUONEB) 0.5-2.5 (3) MG/3ML SOLN Take 3 mLs by nebulization 4 (four) times daily as needed (for shortness of breath).    . mometasone-formoterol (DULERA) 200-5 MCG/ACT AERO Inhale 1 puff into the lungs 2 (two) times daily.     . potassium chloride (K-DUR,KLOR-CON) 10 MEQ  tablet Take 10 mEq by mouth once.    . theophylline (UNIPHYL) 400 MG 24 hr tablet Take 400 mg by mouth 2 (two) times daily.    . Tiotropium Bromide Monohydrate (SPIRIVA RESPIMAT) 2.5 MCG/ACT AERS Inhale 2 puffs into the lungs daily.     . predniSONE (STERAPRED UNI-PAK 21 TAB) 10 MG (21) TBPK tablet 40mg  oral 1 day, then 20mg  oral for 2 days, then 10mg  oral 2 days, then stop 10 tablet 0   No current facility-administered medications for this visit.     Functional Status: In your present state of health, do you have any difficulty performing the following activities: 06/28/2016 06/23/2016  Hearing? - N  Vision? - N  Difficulty concentrating or making decisions? - N  Walking or climbing stairs? - Y  Dressing or bathing? - N  Doing errands, shopping? N N  Preparing Food and eating ? - -  Using the Toilet? - -  In the past six months, have you accidently leaked urine? - -  Do you have problems with loss of bowel control? - -  Managing your Medications? - -  Managing your Finances? - -  Housekeeping or managing your Housekeeping? - -  Some recent data might be hidden    Fall/Depression Screening: PHQ 2/9 Scores 04/12/2016 03/29/2016 03/02/2016 02/02/2016 12/13/2015  PHQ - 2 Score 2 2 2 2 2   PHQ- 9 Score 9 7 8 8 8     Assessment:  Medications  were reviewed by patient report and comparing this to [facility] hospital discharge summary from 06/28/16.    Drugs sorted by system:  Neurologic/Psychologic: -alprazolam -escitalopram -gabapentin  Cardiovascular:  -furosemide  Pulmonary/Allergy: -albuterol inhaler -ipratropium/albuterol nebs as needed -mometasone/formoterol (Dulera) -theophylline -tiotropium (Spiriva)  Vitamins/Minerals: -potassium chloride    Miscellaneous: -prednisone---patient was discharged on prednisone taper he states he completed  Medication assistance: Reminded patient he will likely need to re-apply for patient assistance from [employer] ([place]) and [employer] (Spiriva) in 2018 as Part D beneficiaries are typically only approved through a calendar year.     Plan:  Placed phone call to [facility] RN Community to update her on patient---she states she will reach out to patient to determine if he has any nursing needs.   Will route this medication review note to PCP.   Will not open pharmacy case at this time as patient denies pharmacy needs or need for a pharmacy home visit.    Patient aware he can contact [facility] should he have new pharmacy needs.   Tommye StandardKevin Braedin Millhouse, PharmD, [facility] Clinical Pharmacist [name] [phone]

## 2016-07-21 NOTE — Patient Outreach (Signed)
[facility]) Care Management  07/21/2016  Dustin [name] July 08, 1956 [id number]  [name] is a 60 year old gentleman who was admitted to the hospital from 12/06/15 to 12/10/15 with acute on chronic hypoxic respiratory failure secondary to COPD flare requiring mechanical ventilation. Dustin [name] has history of CHF w/ acute episode during that hospital admission.  As a COPD Gold IV patient, Dustin [name] was referred to [facility] from the hospital for transition of care services and COPD disease management and care coordination. He was being actively followed in the community for nursing case management, clinical social work, and pharmacy services. Concurrently, Dustin [name] was being followed at home by home health nursing through [facility].   The [facility] community team had been following Dustin [name] in the home since March 2017. We have provided assistance with nursing, social work, and pharmacy services. Having met his case management goals, Dustin [name] was discharged from services in September.   I received a call from Karrie Meres, Pharm D with [facility] notifying me that he had learned Dustin [name] was recently hospitalized. Dustin [name] reached out to Dr. Jenna Luo asking for my contact number.   I called Dustin [name] today and he told me he "just wanted me to know" he'd been in the hospital. He says he has a home health nurse and is attending his provider appointments. His primary complaint has been of tachycardia. He denies chest pain, shortness of breath, or other cardiovascular complaints. Dustin [name] is scheduled to see his primary care provider next week and his cardiologist, Dr. Rockey Situ on the 14th.   Plan: I will reach out to Dustin [name] after his appointment with Dr. Rockey Situ to see if Dustin [name] has case management needs.    [facility]  [phone]

## 2016-07-25 DIAGNOSIS — I110 Hypertensive heart disease with heart failure: Secondary | ICD-10-CM | POA: Diagnosis not present

## 2016-07-25 DIAGNOSIS — Z7951 Long term (current) use of inhaled steroids: Secondary | ICD-10-CM | POA: Diagnosis not present

## 2016-07-25 DIAGNOSIS — J449 Chronic obstructive pulmonary disease, unspecified: Secondary | ICD-10-CM | POA: Diagnosis not present

## 2016-07-25 DIAGNOSIS — I5032 Chronic diastolic (congestive) heart failure: Secondary | ICD-10-CM | POA: Diagnosis not present

## 2016-07-25 DIAGNOSIS — J9611 Chronic respiratory failure with hypoxia: Secondary | ICD-10-CM | POA: Diagnosis not present

## 2016-07-25 DIAGNOSIS — I2510 Atherosclerotic heart disease of native coronary artery without angina pectoris: Secondary | ICD-10-CM | POA: Diagnosis not present

## 2016-07-25 DIAGNOSIS — Z9981 Dependence on supplemental oxygen: Secondary | ICD-10-CM | POA: Diagnosis not present

## 2016-07-26 DIAGNOSIS — J449 Chronic obstructive pulmonary disease, unspecified: Secondary | ICD-10-CM | POA: Diagnosis not present

## 2016-07-26 DIAGNOSIS — Z7951 Long term (current) use of inhaled steroids: Secondary | ICD-10-CM | POA: Diagnosis not present

## 2016-07-26 DIAGNOSIS — J9611 Chronic respiratory failure with hypoxia: Secondary | ICD-10-CM | POA: Diagnosis not present

## 2016-07-26 DIAGNOSIS — I110 Hypertensive heart disease with heart failure: Secondary | ICD-10-CM | POA: Diagnosis not present

## 2016-07-26 DIAGNOSIS — I2510 Atherosclerotic heart disease of native coronary artery without angina pectoris: Secondary | ICD-10-CM | POA: Diagnosis not present

## 2016-07-26 DIAGNOSIS — I5032 Chronic diastolic (congestive) heart failure: Secondary | ICD-10-CM | POA: Diagnosis not present

## 2016-07-26 DIAGNOSIS — Z9981 Dependence on supplemental oxygen: Secondary | ICD-10-CM | POA: Diagnosis not present

## 2016-07-27 DIAGNOSIS — J9611 Chronic respiratory failure with hypoxia: Secondary | ICD-10-CM | POA: Diagnosis not present

## 2016-07-27 DIAGNOSIS — J449 Chronic obstructive pulmonary disease, unspecified: Secondary | ICD-10-CM | POA: Diagnosis not present

## 2016-07-27 DIAGNOSIS — I2510 Atherosclerotic heart disease of native coronary artery without angina pectoris: Secondary | ICD-10-CM | POA: Diagnosis not present

## 2016-07-27 DIAGNOSIS — I110 Hypertensive heart disease with heart failure: Secondary | ICD-10-CM | POA: Diagnosis not present

## 2016-07-27 DIAGNOSIS — Z9981 Dependence on supplemental oxygen: Secondary | ICD-10-CM | POA: Diagnosis not present

## 2016-07-27 DIAGNOSIS — Z7951 Long term (current) use of inhaled steroids: Secondary | ICD-10-CM | POA: Diagnosis not present

## 2016-07-27 DIAGNOSIS — I5032 Chronic diastolic (congestive) heart failure: Secondary | ICD-10-CM | POA: Diagnosis not present

## 2016-07-28 DIAGNOSIS — I2510 Atherosclerotic heart disease of native coronary artery without angina pectoris: Secondary | ICD-10-CM | POA: Diagnosis not present

## 2016-07-28 DIAGNOSIS — Z7951 Long term (current) use of inhaled steroids: Secondary | ICD-10-CM | POA: Diagnosis not present

## 2016-07-28 DIAGNOSIS — I110 Hypertensive heart disease with heart failure: Secondary | ICD-10-CM | POA: Diagnosis not present

## 2016-07-28 DIAGNOSIS — J9611 Chronic respiratory failure with hypoxia: Secondary | ICD-10-CM | POA: Diagnosis not present

## 2016-07-28 DIAGNOSIS — I5032 Chronic diastolic (congestive) heart failure: Secondary | ICD-10-CM | POA: Diagnosis not present

## 2016-07-28 DIAGNOSIS — J449 Chronic obstructive pulmonary disease, unspecified: Secondary | ICD-10-CM | POA: Diagnosis not present

## 2016-07-28 DIAGNOSIS — Z9981 Dependence on supplemental oxygen: Secondary | ICD-10-CM | POA: Diagnosis not present

## 2016-07-31 DIAGNOSIS — G4733 Obstructive sleep apnea (adult) (pediatric): Secondary | ICD-10-CM | POA: Diagnosis not present

## 2016-08-01 ENCOUNTER — Encounter: Payer: Self-pay | Admitting: Cardiovascular Disease

## 2016-08-01 ENCOUNTER — Ambulatory Visit (INDEPENDENT_AMBULATORY_CARE_PROVIDER_SITE_OTHER): Payer: Medicare Other | Admitting: Cardiovascular Disease

## 2016-08-01 VITALS — BP 132/86 | HR 103 | Ht 70.0 in | Wt 218.2 lb

## 2016-08-01 DIAGNOSIS — J441 Chronic obstructive pulmonary disease with (acute) exacerbation: Secondary | ICD-10-CM

## 2016-08-01 DIAGNOSIS — F17200 Nicotine dependence, unspecified, uncomplicated: Secondary | ICD-10-CM

## 2016-08-01 DIAGNOSIS — R0602 Shortness of breath: Secondary | ICD-10-CM

## 2016-08-01 DIAGNOSIS — I10 Essential (primary) hypertension: Secondary | ICD-10-CM | POA: Diagnosis not present

## 2016-08-01 DIAGNOSIS — I5032 Chronic diastolic (congestive) heart failure: Secondary | ICD-10-CM | POA: Diagnosis not present

## 2016-08-01 DIAGNOSIS — J9622 Acute and chronic respiratory failure with hypercapnia: Secondary | ICD-10-CM

## 2016-08-01 MED ORDER — DILTIAZEM HCL 30 MG PO TABS: 30.0000 mg | ORAL_TABLET | Freq: Three times a day (TID) | ORAL | 6 refills | Status: DC | PRN

## 2016-08-01 MED ORDER — DILTIAZEM HCL 30 MG PO TABS
30.0000 mg | ORAL_TABLET | Freq: Three times a day (TID) | ORAL | 6 refills | Status: DC | PRN
Start: 2016-08-01 — End: 2016-08-01

## 2016-08-01 NOTE — Patient Instructions (Addendum)
Medication Instructions:   Please take diltiazem 30 mg as needed for fast heart rate Ok to take 1 or 2 pills as needed  Labwork:  No new labs needed  Testing/Procedures:  No further testing at this time   I recommend watching educational videos on topics of interest to you at:       [email or web address]  Enter code: HEARTCARE    Follow-Up: It was a pleasure seeing you in the office today. Please call us if you have new issues that need to be addressed before your next appt.  [phone]  Your physician wants you to follow-up in: 6 months.  You will receive a reminder letter in the mail two months in advance. If you don't receive a letter, please call our office to schedule the follow-up appointment.  If you need a refill on your cardiac medications before your next appointment, please call your pharmacy.

## 2016-08-01 NOTE — Progress Notes (Signed)
Cardiology Office Note  Date:  08/01/2016   ID:  Hershal CoriaDennis R Henrickson, DOB 1956/03/22, MRN [id number]  PCP:  Lyndon CodeKHAN, FOZIA M, MD   Chief Complaint  Patient presents with  . other    [facility] Pt c/o SOB. Reviewed medications with pt verbally.    HPI:  Hershal CoriaDennis R Kuzniar is a 60 y.o. maleWith long history of smoking at least 30 years, quit September 2017, emphysema, notes indicating history of acute on chronic diastolic CHF, who presents to the office for follow-up of his shortness of breath, results of his stress test Wears CPAP at night  Stress test results reviewed with him from 05/2016: no ischemia  Reports that he feels well, on chronic nasal cannula oxygen, Reports having chronically elevated Heart rate Previously treated with diltiazem but this was held for low pressure Previous attempts to work with physical therapy sometimes unsuccessful secondary to tachycardia  CT scan chest /ABD reviewed with him in the office today:  Images pulled up minimial if any CAD, PAD. Minimal aortic arch atherosclerosis  No PND, orthopnea, edema. No abdominal pain.  EKG on today's visit shows sinus tachycardia with rate 10 3 bpm, right bundle branch block  Other past medical history reviewed Hospital admission June 22 2016  discharge October 10 for COPD exacerbation with hypoxia. Notes indicating no significant CHF Discharge with antibiotics, prednisone taper  PMH:   has a past medical history of Allergy; Anxiety; Anxiety; Asthma; Chronic diastolic CHF (congestive heart failure) (HCC); Chronic respiratory failure (HCC); COPD (chronic obstructive pulmonary disease) (HCC); Depression; Depression; Depression; Emphysema of lung (HCC); GERD (gastroesophageal reflux disease); Hypertension; Personal history of tobacco use, presenting hazards to health (08/17/2015); and Tobacco abuse.  PSH:    Past Surgical History:  Procedure Laterality Date  . ABDOMINAL SURGERY    . ADENOIDECTOMY    . hemorrh     . NOSE SURGERY    . URETHRA SURGERY     surgery 6 times from age [age] yrs old    Current Outpatient Prescriptions  Medication Sig Dispense Refill  . Albuterol Sulfate (PROAIR RESPICLICK) 108 (90 BASE) MCG/ACT AEPB Inhale 2 puffs into the lungs every 4 (four) hours as needed (for shortness of breath). Reported on 12/10/2015    . ALPRAZolam (XANAX) 1 MG tablet Take 1 tablet (1 mg total) by mouth 3 (three) times daily as needed for anxiety. 15 tablet 0  . Cholecalciferol (VITAMIN D) 2000 units CAPS Take 2,000 Units by mouth daily.    Marland Kitchen. escitalopram (LEXAPRO) 20 MG tablet Take 20 mg by mouth at bedtime.     . furosemide (LASIX) 20 MG tablet Take 20 mg by mouth.    . gabapentin (NEURONTIN) 600 MG tablet Take 600 mg by mouth 3 (three) times daily.    Marland Kitchen. ipratropium-albuterol (DUONEB) 0.5-2.5 (3) MG/3ML SOLN Take 3 mLs by nebulization 4 (four) times daily as needed (for shortness of breath).    . mometasone-formoterol (DULERA) 200-5 MCG/ACT AERO Inhale 1 puff into the lungs 2 (two) times daily.     . potassium chloride (K-DUR,KLOR-CON) 10 MEQ tablet Take 10 mEq by mouth once.    . predniSONE (STERAPRED UNI-PAK 21 TAB) 10 MG (21) TBPK tablet 40mg  oral 1 day, then 20mg  oral for 2 days, then 10mg  oral 2 days, then stop 10 tablet 0  . Probiotic Product (PROBIOTIC-10) CAPS Take 10 capsules by mouth daily.    . theophylline (UNIPHYL) 400 MG 24 hr tablet Take 400 mg by mouth 2 (two)  times daily.    . Tiotropium Bromide Monohydrate (SPIRIVA RESPIMAT) 2.5 MCG/ACT AERS Inhale 2 puffs into the lungs daily.     Marland Kitchen. diltiazem (CARDIZEM) 30 MG tablet Take 1 tablet (30 mg total) by mouth 3 (three) times daily as needed. 90 tablet 6   No current facility-administered medications for this visit.      Allergies:   Asa [aspirin]; Bee venom; Codeine; Ibuprofen; and Methylprednisolone sodium succ   Social History:  The patient  reports that he has quit smoking. His smoking use included E-cigarettes. He has a 64.50  pack-year smoking history. He has never used smokeless tobacco. He reports that he does not drink alcohol or use drugs.   Family History:   family history includes CAD in his father.    Review of Systems: Review of Systems  Constitutional: Negative.   Respiratory: Negative.   Cardiovascular: Negative.   Gastrointestinal: Negative.   Musculoskeletal: Negative.   Neurological: Negative.   Psychiatric/Behavioral: Negative.   All other systems reviewed and are negative.    PHYSICAL EXAM: VS:  BP 132/86 (BP Location: Left Arm, Patient Position: Sitting, Cuff Size: Normal)   Pulse (!) 103   Ht 5\' 10"  (1.778 m)   Wt 218 lb 4 oz (99 kg)   SpO2 94%   BMI 31.32 kg/m  , BMI Body mass index is 31.32 kg/m. GEN: Well nourished, well developed, in no acute distress , on nasal cannula oxygen HEENT: normal  Neck: no JVD, carotid bruits, or masses Cardiac: RRR; no murmurs, rubs, or gallops,no edema  Respiratory:  Decreased breath sounds throughout bilaterally,  normal work of breathing GI: soft, nontender, nondistended, + BS MS: no deformity or atrophy  Skin: warm and dry, no rash Neuro:  Strength and sensation are intact Psych: euthymic mood, full affect    Recent Labs: 12/05/2015: Magnesium 2.1 06/01/2016: ALT 13; TSH 1.580 06/22/2016: B Natriuretic Peptide 369.0 06/23/2016: BUN 11; Potassium 4.9; Sodium 143 06/27/2016: Creatinine, Ser 1.22 06/29/2016: Hemoglobin 14.3; Platelets 287    Lipid Panel Lab Results  Component Value Date   CHOL 133 06/01/2016   HDL 59 06/01/2016   LDLCALC 61 06/01/2016   TRIG 64 06/01/2016      Wt Readings from Last 3 Encounters:  08/01/16 218 lb 4 oz (99 kg)  06/28/16 213 lb (96.6 kg)  05/16/16 218 lb 6.4 oz (99.1 kg)      ASSESSMENT AND PLAN:  Essential hypertension - Plan: EKG 12-Lead Blood pressure is well controlled on today's visit. No changes made to the medications.  SOB (shortness of breath) - Plan: EKG 12-Lead Chronic shortness  of breath secondary to COPD Stop smoking 2 months ago after 30+ years On several inhalers, oxygen Recent negative stress test CT scan chest with minimal coronary calcifications noted  Chronic diastolic heart failure (HCC) Appears relatively euvolemic on Lasix daily with potassium  COPD exacerbation (HCC) Recent hospital admission with treatment October 2017 occurring antibiotics and steroids  Acute on chronic respiratory failure with hypercapnia (HCC)  Tobacco use disorder We have encouraged him to continue to work on weaning his cigarettes and smoking cessation. He will continue to work on this and does not want any assistance with chantix. Reports that he stopped September 2017   Total encounter time more than 25 minutes  Greater than 50% was spent in counseling and coordination of care with the patient   Disposition:   F/U  12 months   Orders Placed This Encounter  Procedures  . EKG  12-Lead     Signed, Esmond Plants, M.D., Ph.D. 08/01/2016  [place], [place]

## 2016-08-02 DIAGNOSIS — I110 Hypertensive heart disease with heart failure: Secondary | ICD-10-CM | POA: Diagnosis not present

## 2016-08-02 DIAGNOSIS — J9611 Chronic respiratory failure with hypoxia: Secondary | ICD-10-CM | POA: Diagnosis not present

## 2016-08-02 DIAGNOSIS — Z9981 Dependence on supplemental oxygen: Secondary | ICD-10-CM | POA: Diagnosis not present

## 2016-08-02 DIAGNOSIS — Z7951 Long term (current) use of inhaled steroids: Secondary | ICD-10-CM | POA: Diagnosis not present

## 2016-08-02 DIAGNOSIS — I5032 Chronic diastolic (congestive) heart failure: Secondary | ICD-10-CM | POA: Diagnosis not present

## 2016-08-02 DIAGNOSIS — J449 Chronic obstructive pulmonary disease, unspecified: Secondary | ICD-10-CM | POA: Diagnosis not present

## 2016-08-02 DIAGNOSIS — I2510 Atherosclerotic heart disease of native coronary artery without angina pectoris: Secondary | ICD-10-CM | POA: Diagnosis not present

## 2016-08-03 ENCOUNTER — Other Ambulatory Visit: Payer: Self-pay | Admitting: *Deleted

## 2016-08-03 DIAGNOSIS — Z9981 Dependence on supplemental oxygen: Secondary | ICD-10-CM | POA: Diagnosis not present

## 2016-08-03 DIAGNOSIS — J9611 Chronic respiratory failure with hypoxia: Secondary | ICD-10-CM | POA: Diagnosis not present

## 2016-08-03 DIAGNOSIS — I2510 Atherosclerotic heart disease of native coronary artery without angina pectoris: Secondary | ICD-10-CM | POA: Diagnosis not present

## 2016-08-03 DIAGNOSIS — I5032 Chronic diastolic (congestive) heart failure: Secondary | ICD-10-CM | POA: Diagnosis not present

## 2016-08-03 DIAGNOSIS — J449 Chronic obstructive pulmonary disease, unspecified: Secondary | ICD-10-CM | POA: Diagnosis not present

## 2016-08-03 DIAGNOSIS — Z7951 Long term (current) use of inhaled steroids: Secondary | ICD-10-CM | POA: Diagnosis not present

## 2016-08-03 DIAGNOSIS — I110 Hypertensive heart disease with heart failure: Secondary | ICD-10-CM | POA: Diagnosis not present

## 2016-08-03 NOTE — Patient Outreach (Signed)
[name]) Care [name]  08/03/2016  Dustin CoriaDennis R [name] Oct 16, 1955 [id number]  I spoke with Dustin [name] by phone today to follow up on his cardiology office visit this week. Dustin [name] says he had an excellent visit and as told that none of his current symptoms are related to any acute heart conditions. Dustin [name] is "absolutely thrilled" about the outcome of the visit. He is to see the cardiologist again in a year.   Dustin [name] reports that he has telemonitoring in place, good support from his son and family, is seeing his providers as recommended, has all needed respiratory equipment (O2, nebulizer), and feels he is "doing as well as can be expected with bad lungs". Dustin [name] doesn't feel he is in need of case [name] services at this time but has our contact information and says he will call me if needs arise.   Plan: I will not reopen Dustin [name]'s case to nursing services but have made myself available to him if he is in need in the future.    Marja Kayslisa Verity Gilcrest MHA,BSN,RN,CCM Dustin [name]  [phone]

## 2016-08-04 DIAGNOSIS — Z9981 Dependence on supplemental oxygen: Secondary | ICD-10-CM | POA: Diagnosis not present

## 2016-08-04 DIAGNOSIS — I110 Hypertensive heart disease with heart failure: Secondary | ICD-10-CM | POA: Diagnosis not present

## 2016-08-04 DIAGNOSIS — I5032 Chronic diastolic (congestive) heart failure: Secondary | ICD-10-CM | POA: Diagnosis not present

## 2016-08-04 DIAGNOSIS — J9611 Chronic respiratory failure with hypoxia: Secondary | ICD-10-CM | POA: Diagnosis not present

## 2016-08-04 DIAGNOSIS — Z7951 Long term (current) use of inhaled steroids: Secondary | ICD-10-CM | POA: Diagnosis not present

## 2016-08-04 DIAGNOSIS — I2510 Atherosclerotic heart disease of native coronary artery without angina pectoris: Secondary | ICD-10-CM | POA: Diagnosis not present

## 2016-08-04 DIAGNOSIS — J449 Chronic obstructive pulmonary disease, unspecified: Secondary | ICD-10-CM | POA: Diagnosis not present

## 2016-08-07 DIAGNOSIS — Z7951 Long term (current) use of inhaled steroids: Secondary | ICD-10-CM | POA: Diagnosis not present

## 2016-08-07 DIAGNOSIS — J449 Chronic obstructive pulmonary disease, unspecified: Secondary | ICD-10-CM | POA: Diagnosis not present

## 2016-08-07 DIAGNOSIS — I5032 Chronic diastolic (congestive) heart failure: Secondary | ICD-10-CM | POA: Diagnosis not present

## 2016-08-07 DIAGNOSIS — Z9981 Dependence on supplemental oxygen: Secondary | ICD-10-CM | POA: Diagnosis not present

## 2016-08-07 DIAGNOSIS — J9611 Chronic respiratory failure with hypoxia: Secondary | ICD-10-CM | POA: Diagnosis not present

## 2016-08-07 DIAGNOSIS — I2510 Atherosclerotic heart disease of native coronary artery without angina pectoris: Secondary | ICD-10-CM | POA: Diagnosis not present

## 2016-08-07 DIAGNOSIS — I110 Hypertensive heart disease with heart failure: Secondary | ICD-10-CM | POA: Diagnosis not present

## 2016-08-09 DIAGNOSIS — I2510 Atherosclerotic heart disease of native coronary artery without angina pectoris: Secondary | ICD-10-CM | POA: Diagnosis not present

## 2016-08-09 DIAGNOSIS — Z9981 Dependence on supplemental oxygen: Secondary | ICD-10-CM | POA: Diagnosis not present

## 2016-08-09 DIAGNOSIS — I5032 Chronic diastolic (congestive) heart failure: Secondary | ICD-10-CM | POA: Diagnosis not present

## 2016-08-09 DIAGNOSIS — J9611 Chronic respiratory failure with hypoxia: Secondary | ICD-10-CM | POA: Diagnosis not present

## 2016-08-09 DIAGNOSIS — I110 Hypertensive heart disease with heart failure: Secondary | ICD-10-CM | POA: Diagnosis not present

## 2016-08-09 DIAGNOSIS — J449 Chronic obstructive pulmonary disease, unspecified: Secondary | ICD-10-CM | POA: Diagnosis not present

## 2016-08-09 DIAGNOSIS — Z7951 Long term (current) use of inhaled steroids: Secondary | ICD-10-CM | POA: Diagnosis not present

## 2016-08-14 ENCOUNTER — Encounter: Payer: Self-pay | Admitting: *Deleted

## 2016-08-14 NOTE — Telephone Encounter (Signed)
This encounter was created in error - please disregard.

## 2016-08-15 DIAGNOSIS — J9621 Acute and chronic respiratory failure with hypoxia: Secondary | ICD-10-CM | POA: Diagnosis not present

## 2016-08-15 DIAGNOSIS — I2510 Atherosclerotic heart disease of native coronary artery without angina pectoris: Secondary | ICD-10-CM | POA: Diagnosis not present

## 2016-08-15 DIAGNOSIS — I110 Hypertensive heart disease with heart failure: Secondary | ICD-10-CM | POA: Diagnosis not present

## 2016-08-15 DIAGNOSIS — Z792 Long term (current) use of antibiotics: Secondary | ICD-10-CM | POA: Diagnosis not present

## 2016-08-15 DIAGNOSIS — I5032 Chronic diastolic (congestive) heart failure: Secondary | ICD-10-CM | POA: Diagnosis not present

## 2016-08-15 DIAGNOSIS — Z9981 Dependence on supplemental oxygen: Secondary | ICD-10-CM | POA: Diagnosis not present

## 2016-08-15 DIAGNOSIS — J439 Emphysema, unspecified: Secondary | ICD-10-CM | POA: Diagnosis not present

## 2016-08-16 DIAGNOSIS — G4733 Obstructive sleep apnea (adult) (pediatric): Secondary | ICD-10-CM | POA: Diagnosis not present

## 2016-08-16 DIAGNOSIS — J9611 Chronic respiratory failure with hypoxia: Secondary | ICD-10-CM | POA: Diagnosis not present

## 2016-08-16 DIAGNOSIS — J441 Chronic obstructive pulmonary disease with (acute) exacerbation: Secondary | ICD-10-CM | POA: Diagnosis not present

## 2016-08-17 DIAGNOSIS — J439 Emphysema, unspecified: Secondary | ICD-10-CM | POA: Diagnosis not present

## 2016-08-17 DIAGNOSIS — J9621 Acute and chronic respiratory failure with hypoxia: Secondary | ICD-10-CM | POA: Diagnosis not present

## 2016-08-17 DIAGNOSIS — I5032 Chronic diastolic (congestive) heart failure: Secondary | ICD-10-CM | POA: Diagnosis not present

## 2016-08-17 DIAGNOSIS — I2510 Atherosclerotic heart disease of native coronary artery without angina pectoris: Secondary | ICD-10-CM | POA: Diagnosis not present

## 2016-08-17 DIAGNOSIS — Z792 Long term (current) use of antibiotics: Secondary | ICD-10-CM | POA: Diagnosis not present

## 2016-08-17 DIAGNOSIS — I110 Hypertensive heart disease with heart failure: Secondary | ICD-10-CM | POA: Diagnosis not present

## 2016-08-17 DIAGNOSIS — Z9981 Dependence on supplemental oxygen: Secondary | ICD-10-CM | POA: Diagnosis not present

## 2016-08-18 DIAGNOSIS — I2510 Atherosclerotic heart disease of native coronary artery without angina pectoris: Secondary | ICD-10-CM | POA: Diagnosis not present

## 2016-08-18 DIAGNOSIS — I5032 Chronic diastolic (congestive) heart failure: Secondary | ICD-10-CM | POA: Diagnosis not present

## 2016-08-18 DIAGNOSIS — J9621 Acute and chronic respiratory failure with hypoxia: Secondary | ICD-10-CM | POA: Diagnosis not present

## 2016-08-18 DIAGNOSIS — J449 Chronic obstructive pulmonary disease, unspecified: Secondary | ICD-10-CM | POA: Diagnosis not present

## 2016-08-18 DIAGNOSIS — Z792 Long term (current) use of antibiotics: Secondary | ICD-10-CM | POA: Diagnosis not present

## 2016-08-18 DIAGNOSIS — J439 Emphysema, unspecified: Secondary | ICD-10-CM | POA: Diagnosis not present

## 2016-08-18 DIAGNOSIS — I110 Hypertensive heart disease with heart failure: Secondary | ICD-10-CM | POA: Diagnosis not present

## 2016-08-18 DIAGNOSIS — Z9981 Dependence on supplemental oxygen: Secondary | ICD-10-CM | POA: Diagnosis not present

## 2016-08-21 DIAGNOSIS — Z792 Long term (current) use of antibiotics: Secondary | ICD-10-CM | POA: Diagnosis not present

## 2016-08-21 DIAGNOSIS — J9621 Acute and chronic respiratory failure with hypoxia: Secondary | ICD-10-CM | POA: Diagnosis not present

## 2016-08-21 DIAGNOSIS — I110 Hypertensive heart disease with heart failure: Secondary | ICD-10-CM | POA: Diagnosis not present

## 2016-08-21 DIAGNOSIS — Z9981 Dependence on supplemental oxygen: Secondary | ICD-10-CM | POA: Diagnosis not present

## 2016-08-21 DIAGNOSIS — I2510 Atherosclerotic heart disease of native coronary artery without angina pectoris: Secondary | ICD-10-CM | POA: Diagnosis not present

## 2016-08-21 DIAGNOSIS — I5032 Chronic diastolic (congestive) heart failure: Secondary | ICD-10-CM | POA: Diagnosis not present

## 2016-08-21 DIAGNOSIS — J439 Emphysema, unspecified: Secondary | ICD-10-CM | POA: Diagnosis not present

## 2016-08-22 DIAGNOSIS — J9621 Acute and chronic respiratory failure with hypoxia: Secondary | ICD-10-CM | POA: Diagnosis not present

## 2016-08-22 DIAGNOSIS — I5032 Chronic diastolic (congestive) heart failure: Secondary | ICD-10-CM | POA: Diagnosis not present

## 2016-08-22 DIAGNOSIS — Z792 Long term (current) use of antibiotics: Secondary | ICD-10-CM | POA: Diagnosis not present

## 2016-08-22 DIAGNOSIS — J439 Emphysema, unspecified: Secondary | ICD-10-CM | POA: Diagnosis not present

## 2016-08-22 DIAGNOSIS — I110 Hypertensive heart disease with heart failure: Secondary | ICD-10-CM | POA: Diagnosis not present

## 2016-08-22 DIAGNOSIS — I2510 Atherosclerotic heart disease of native coronary artery without angina pectoris: Secondary | ICD-10-CM | POA: Diagnosis not present

## 2016-08-22 DIAGNOSIS — Z9981 Dependence on supplemental oxygen: Secondary | ICD-10-CM | POA: Diagnosis not present

## 2016-08-23 DIAGNOSIS — I2510 Atherosclerotic heart disease of native coronary artery without angina pectoris: Secondary | ICD-10-CM | POA: Diagnosis not present

## 2016-08-23 DIAGNOSIS — Z9981 Dependence on supplemental oxygen: Secondary | ICD-10-CM | POA: Diagnosis not present

## 2016-08-23 DIAGNOSIS — J9621 Acute and chronic respiratory failure with hypoxia: Secondary | ICD-10-CM | POA: Diagnosis not present

## 2016-08-23 DIAGNOSIS — Z792 Long term (current) use of antibiotics: Secondary | ICD-10-CM | POA: Diagnosis not present

## 2016-08-23 DIAGNOSIS — I5032 Chronic diastolic (congestive) heart failure: Secondary | ICD-10-CM | POA: Diagnosis not present

## 2016-08-23 DIAGNOSIS — I110 Hypertensive heart disease with heart failure: Secondary | ICD-10-CM | POA: Diagnosis not present

## 2016-08-23 DIAGNOSIS — J439 Emphysema, unspecified: Secondary | ICD-10-CM | POA: Diagnosis not present

## 2016-08-25 DIAGNOSIS — Z9981 Dependence on supplemental oxygen: Secondary | ICD-10-CM | POA: Diagnosis not present

## 2016-08-25 DIAGNOSIS — I5032 Chronic diastolic (congestive) heart failure: Secondary | ICD-10-CM | POA: Diagnosis not present

## 2016-08-25 DIAGNOSIS — I2510 Atherosclerotic heart disease of native coronary artery without angina pectoris: Secondary | ICD-10-CM | POA: Diagnosis not present

## 2016-08-25 DIAGNOSIS — I110 Hypertensive heart disease with heart failure: Secondary | ICD-10-CM | POA: Diagnosis not present

## 2016-08-25 DIAGNOSIS — Z792 Long term (current) use of antibiotics: Secondary | ICD-10-CM | POA: Diagnosis not present

## 2016-08-25 DIAGNOSIS — J9621 Acute and chronic respiratory failure with hypoxia: Secondary | ICD-10-CM | POA: Diagnosis not present

## 2016-08-25 DIAGNOSIS — J439 Emphysema, unspecified: Secondary | ICD-10-CM | POA: Diagnosis not present

## 2016-08-28 DIAGNOSIS — J439 Emphysema, unspecified: Secondary | ICD-10-CM | POA: Diagnosis not present

## 2016-08-28 DIAGNOSIS — I5032 Chronic diastolic (congestive) heart failure: Secondary | ICD-10-CM | POA: Diagnosis not present

## 2016-08-28 DIAGNOSIS — I2510 Atherosclerotic heart disease of native coronary artery without angina pectoris: Secondary | ICD-10-CM | POA: Diagnosis not present

## 2016-08-28 DIAGNOSIS — Z792 Long term (current) use of antibiotics: Secondary | ICD-10-CM | POA: Diagnosis not present

## 2016-08-28 DIAGNOSIS — J9621 Acute and chronic respiratory failure with hypoxia: Secondary | ICD-10-CM | POA: Diagnosis not present

## 2016-08-28 DIAGNOSIS — Z9981 Dependence on supplemental oxygen: Secondary | ICD-10-CM | POA: Diagnosis not present

## 2016-08-28 DIAGNOSIS — I110 Hypertensive heart disease with heart failure: Secondary | ICD-10-CM | POA: Diagnosis not present

## 2016-08-30 DIAGNOSIS — G4733 Obstructive sleep apnea (adult) (pediatric): Secondary | ICD-10-CM | POA: Diagnosis not present

## 2016-08-30 DIAGNOSIS — Z9981 Dependence on supplemental oxygen: Secondary | ICD-10-CM | POA: Diagnosis not present

## 2016-08-30 DIAGNOSIS — J439 Emphysema, unspecified: Secondary | ICD-10-CM | POA: Diagnosis not present

## 2016-08-30 DIAGNOSIS — I110 Hypertensive heart disease with heart failure: Secondary | ICD-10-CM | POA: Diagnosis not present

## 2016-08-30 DIAGNOSIS — I5032 Chronic diastolic (congestive) heart failure: Secondary | ICD-10-CM | POA: Diagnosis not present

## 2016-08-30 DIAGNOSIS — J9621 Acute and chronic respiratory failure with hypoxia: Secondary | ICD-10-CM | POA: Diagnosis not present

## 2016-08-30 DIAGNOSIS — Z792 Long term (current) use of antibiotics: Secondary | ICD-10-CM | POA: Diagnosis not present

## 2016-08-30 DIAGNOSIS — I2510 Atherosclerotic heart disease of native coronary artery without angina pectoris: Secondary | ICD-10-CM | POA: Diagnosis not present

## 2016-08-31 DIAGNOSIS — I5032 Chronic diastolic (congestive) heart failure: Secondary | ICD-10-CM | POA: Diagnosis not present

## 2016-08-31 DIAGNOSIS — J439 Emphysema, unspecified: Secondary | ICD-10-CM | POA: Diagnosis not present

## 2016-08-31 DIAGNOSIS — Z9981 Dependence on supplemental oxygen: Secondary | ICD-10-CM | POA: Diagnosis not present

## 2016-08-31 DIAGNOSIS — I110 Hypertensive heart disease with heart failure: Secondary | ICD-10-CM | POA: Diagnosis not present

## 2016-08-31 DIAGNOSIS — I2510 Atherosclerotic heart disease of native coronary artery without angina pectoris: Secondary | ICD-10-CM | POA: Diagnosis not present

## 2016-08-31 DIAGNOSIS — Z792 Long term (current) use of antibiotics: Secondary | ICD-10-CM | POA: Diagnosis not present

## 2016-08-31 DIAGNOSIS — J9621 Acute and chronic respiratory failure with hypoxia: Secondary | ICD-10-CM | POA: Diagnosis not present

## 2016-09-01 DIAGNOSIS — J449 Chronic obstructive pulmonary disease, unspecified: Secondary | ICD-10-CM | POA: Diagnosis not present

## 2016-09-01 DIAGNOSIS — J471 Bronchiectasis with (acute) exacerbation: Secondary | ICD-10-CM | POA: Diagnosis not present

## 2016-09-01 DIAGNOSIS — A4902 Methicillin resistant Staphylococcus aureus infection, unspecified site: Secondary | ICD-10-CM | POA: Diagnosis not present

## 2016-09-01 DIAGNOSIS — G4733 Obstructive sleep apnea (adult) (pediatric): Secondary | ICD-10-CM | POA: Diagnosis not present

## 2016-09-04 DIAGNOSIS — I5032 Chronic diastolic (congestive) heart failure: Secondary | ICD-10-CM | POA: Diagnosis not present

## 2016-09-04 DIAGNOSIS — I2510 Atherosclerotic heart disease of native coronary artery without angina pectoris: Secondary | ICD-10-CM | POA: Diagnosis not present

## 2016-09-04 DIAGNOSIS — I110 Hypertensive heart disease with heart failure: Secondary | ICD-10-CM | POA: Diagnosis not present

## 2016-09-04 DIAGNOSIS — J9621 Acute and chronic respiratory failure with hypoxia: Secondary | ICD-10-CM | POA: Diagnosis not present

## 2016-09-04 DIAGNOSIS — Z792 Long term (current) use of antibiotics: Secondary | ICD-10-CM | POA: Diagnosis not present

## 2016-09-04 DIAGNOSIS — J439 Emphysema, unspecified: Secondary | ICD-10-CM | POA: Diagnosis not present

## 2016-09-04 DIAGNOSIS — Z9981 Dependence on supplemental oxygen: Secondary | ICD-10-CM | POA: Diagnosis not present

## 2016-09-06 DIAGNOSIS — Z792 Long term (current) use of antibiotics: Secondary | ICD-10-CM | POA: Diagnosis not present

## 2016-09-06 DIAGNOSIS — I5032 Chronic diastolic (congestive) heart failure: Secondary | ICD-10-CM | POA: Diagnosis not present

## 2016-09-06 DIAGNOSIS — J439 Emphysema, unspecified: Secondary | ICD-10-CM | POA: Diagnosis not present

## 2016-09-06 DIAGNOSIS — Z9981 Dependence on supplemental oxygen: Secondary | ICD-10-CM | POA: Diagnosis not present

## 2016-09-06 DIAGNOSIS — I2510 Atherosclerotic heart disease of native coronary artery without angina pectoris: Secondary | ICD-10-CM | POA: Diagnosis not present

## 2016-09-06 DIAGNOSIS — I110 Hypertensive heart disease with heart failure: Secondary | ICD-10-CM | POA: Diagnosis not present

## 2016-09-06 DIAGNOSIS — J9621 Acute and chronic respiratory failure with hypoxia: Secondary | ICD-10-CM | POA: Diagnosis not present

## 2016-09-07 DIAGNOSIS — Z792 Long term (current) use of antibiotics: Secondary | ICD-10-CM | POA: Diagnosis not present

## 2016-09-07 DIAGNOSIS — J439 Emphysema, unspecified: Secondary | ICD-10-CM | POA: Diagnosis not present

## 2016-09-07 DIAGNOSIS — Z9981 Dependence on supplemental oxygen: Secondary | ICD-10-CM | POA: Diagnosis not present

## 2016-09-07 DIAGNOSIS — J9621 Acute and chronic respiratory failure with hypoxia: Secondary | ICD-10-CM | POA: Diagnosis not present

## 2016-09-07 DIAGNOSIS — I2510 Atherosclerotic heart disease of native coronary artery without angina pectoris: Secondary | ICD-10-CM | POA: Diagnosis not present

## 2016-09-07 DIAGNOSIS — I5032 Chronic diastolic (congestive) heart failure: Secondary | ICD-10-CM | POA: Diagnosis not present

## 2016-09-07 DIAGNOSIS — I110 Hypertensive heart disease with heart failure: Secondary | ICD-10-CM | POA: Diagnosis not present

## 2016-09-08 DIAGNOSIS — J441 Chronic obstructive pulmonary disease with (acute) exacerbation: Secondary | ICD-10-CM | POA: Diagnosis not present

## 2016-09-08 DIAGNOSIS — M792 Neuralgia and neuritis, unspecified: Secondary | ICD-10-CM | POA: Diagnosis not present

## 2016-09-08 DIAGNOSIS — J9621 Acute and chronic respiratory failure with hypoxia: Secondary | ICD-10-CM | POA: Diagnosis not present

## 2016-09-08 DIAGNOSIS — Z0001 Encounter for general adult medical examination with abnormal findings: Secondary | ICD-10-CM | POA: Diagnosis not present

## 2016-09-08 DIAGNOSIS — I5022 Chronic systolic (congestive) heart failure: Secondary | ICD-10-CM | POA: Diagnosis not present

## 2016-09-08 DIAGNOSIS — E119 Type 2 diabetes mellitus without complications: Secondary | ICD-10-CM | POA: Diagnosis not present

## 2016-09-13 DIAGNOSIS — I2510 Atherosclerotic heart disease of native coronary artery without angina pectoris: Secondary | ICD-10-CM | POA: Diagnosis not present

## 2016-09-13 DIAGNOSIS — Z9981 Dependence on supplemental oxygen: Secondary | ICD-10-CM | POA: Diagnosis not present

## 2016-09-13 DIAGNOSIS — J439 Emphysema, unspecified: Secondary | ICD-10-CM | POA: Diagnosis not present

## 2016-09-13 DIAGNOSIS — I110 Hypertensive heart disease with heart failure: Secondary | ICD-10-CM | POA: Diagnosis not present

## 2016-09-13 DIAGNOSIS — I5032 Chronic diastolic (congestive) heart failure: Secondary | ICD-10-CM | POA: Diagnosis not present

## 2016-09-13 DIAGNOSIS — Z792 Long term (current) use of antibiotics: Secondary | ICD-10-CM | POA: Diagnosis not present

## 2016-09-13 DIAGNOSIS — J9621 Acute and chronic respiratory failure with hypoxia: Secondary | ICD-10-CM | POA: Diagnosis not present

## 2016-09-16 DIAGNOSIS — J439 Emphysema, unspecified: Secondary | ICD-10-CM | POA: Diagnosis not present

## 2016-09-16 DIAGNOSIS — J9621 Acute and chronic respiratory failure with hypoxia: Secondary | ICD-10-CM | POA: Diagnosis not present

## 2016-09-16 DIAGNOSIS — Z9981 Dependence on supplemental oxygen: Secondary | ICD-10-CM | POA: Diagnosis not present

## 2016-09-16 DIAGNOSIS — Z792 Long term (current) use of antibiotics: Secondary | ICD-10-CM | POA: Diagnosis not present

## 2016-09-16 DIAGNOSIS — I5032 Chronic diastolic (congestive) heart failure: Secondary | ICD-10-CM | POA: Diagnosis not present

## 2016-09-16 DIAGNOSIS — I2510 Atherosclerotic heart disease of native coronary artery without angina pectoris: Secondary | ICD-10-CM | POA: Diagnosis not present

## 2016-09-16 DIAGNOSIS — I110 Hypertensive heart disease with heart failure: Secondary | ICD-10-CM | POA: Diagnosis not present

## 2016-09-18 DIAGNOSIS — J449 Chronic obstructive pulmonary disease, unspecified: Secondary | ICD-10-CM | POA: Diagnosis not present

## 2016-09-20 DIAGNOSIS — Z9981 Dependence on supplemental oxygen: Secondary | ICD-10-CM | POA: Diagnosis not present

## 2016-09-20 DIAGNOSIS — I110 Hypertensive heart disease with heart failure: Secondary | ICD-10-CM | POA: Diagnosis not present

## 2016-09-20 DIAGNOSIS — J439 Emphysema, unspecified: Secondary | ICD-10-CM | POA: Diagnosis not present

## 2016-09-20 DIAGNOSIS — I5032 Chronic diastolic (congestive) heart failure: Secondary | ICD-10-CM | POA: Diagnosis not present

## 2016-09-20 DIAGNOSIS — Z792 Long term (current) use of antibiotics: Secondary | ICD-10-CM | POA: Diagnosis not present

## 2016-09-20 DIAGNOSIS — J9621 Acute and chronic respiratory failure with hypoxia: Secondary | ICD-10-CM | POA: Diagnosis not present

## 2016-09-20 DIAGNOSIS — I2510 Atherosclerotic heart disease of native coronary artery without angina pectoris: Secondary | ICD-10-CM | POA: Diagnosis not present

## 2016-09-21 IMAGING — CR DG CHEST 2V
1 series · 2 of 2 positions shown · non-contrast
Comparison: 09/02/2014

CLINICAL DATA: Worsening shortness of breath and weakness for past
2 days. Hypoxia. COPD.

EXAM:
CHEST  2 VIEW

[Series 2: w chest pa · 0.14mm/px · 2 of 2 slices shown]
[im 1/2]
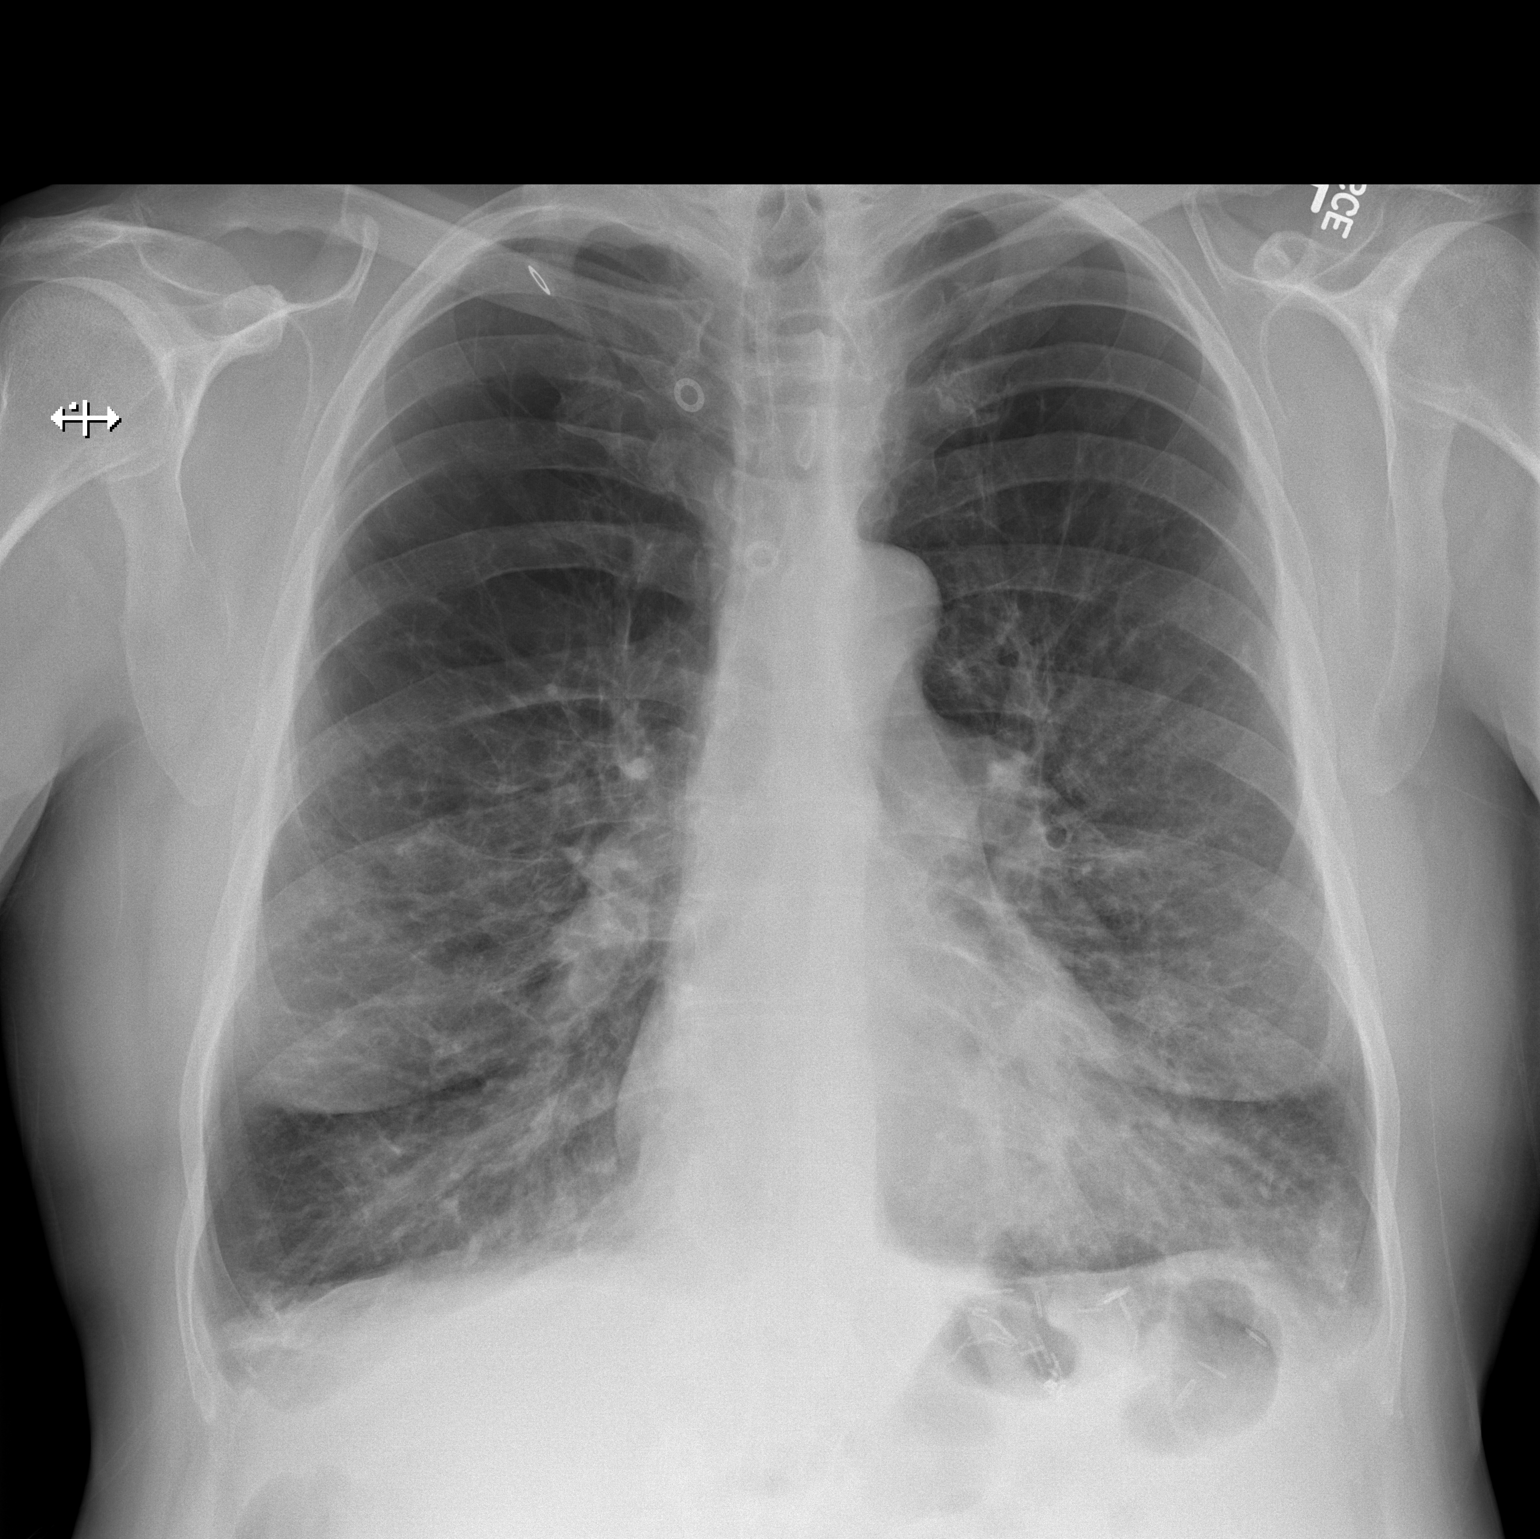
[im 2/2]
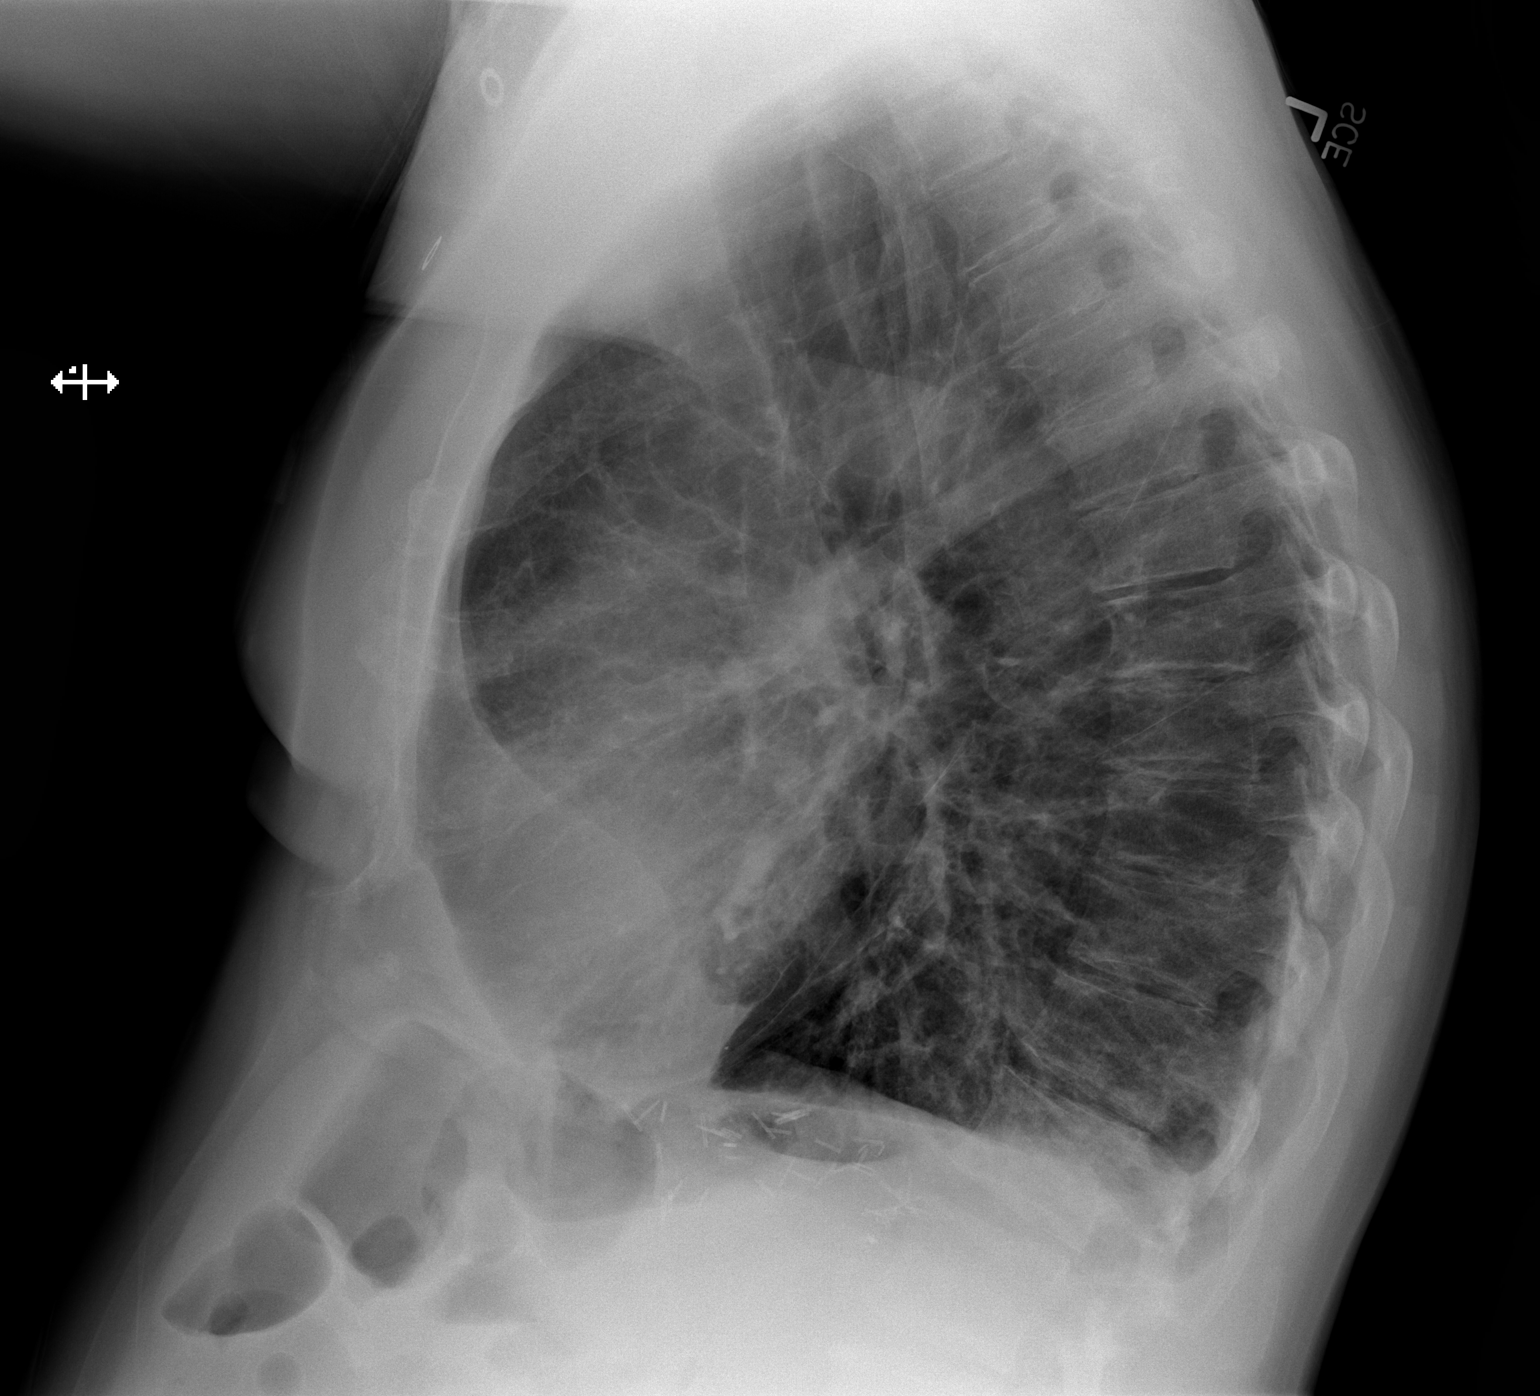

[2 of 2 positions shown; findings below may reference images not displayed]

FINDINGS: Pulmonary hyperinflation and diffuse interstitial prominence show no
significant change, consistent with COPD. No focal areas of
pulmonary consolidation or pleural effusion identified. Heart size
is within normal limits. No hilar or mediastinal masses identified.
Multiple surgical clips again seen in the left upper quadrant.
IMPRESSION: Stable exam.  COPD.  No acute findings.

## 2016-09-22 DIAGNOSIS — Z9981 Dependence on supplemental oxygen: Secondary | ICD-10-CM | POA: Diagnosis not present

## 2016-09-22 DIAGNOSIS — J439 Emphysema, unspecified: Secondary | ICD-10-CM | POA: Diagnosis not present

## 2016-09-22 DIAGNOSIS — I110 Hypertensive heart disease with heart failure: Secondary | ICD-10-CM | POA: Diagnosis not present

## 2016-09-22 DIAGNOSIS — I2510 Atherosclerotic heart disease of native coronary artery without angina pectoris: Secondary | ICD-10-CM | POA: Diagnosis not present

## 2016-09-22 DIAGNOSIS — J9621 Acute and chronic respiratory failure with hypoxia: Secondary | ICD-10-CM | POA: Diagnosis not present

## 2016-09-22 DIAGNOSIS — I5032 Chronic diastolic (congestive) heart failure: Secondary | ICD-10-CM | POA: Diagnosis not present

## 2016-09-22 DIAGNOSIS — Z792 Long term (current) use of antibiotics: Secondary | ICD-10-CM | POA: Diagnosis not present

## 2016-09-25 DIAGNOSIS — J441 Chronic obstructive pulmonary disease with (acute) exacerbation: Secondary | ICD-10-CM | POA: Diagnosis not present

## 2016-09-27 DIAGNOSIS — Z792 Long term (current) use of antibiotics: Secondary | ICD-10-CM | POA: Diagnosis not present

## 2016-09-27 DIAGNOSIS — J439 Emphysema, unspecified: Secondary | ICD-10-CM | POA: Diagnosis not present

## 2016-09-27 DIAGNOSIS — I5032 Chronic diastolic (congestive) heart failure: Secondary | ICD-10-CM | POA: Diagnosis not present

## 2016-09-27 DIAGNOSIS — J9621 Acute and chronic respiratory failure with hypoxia: Secondary | ICD-10-CM | POA: Diagnosis not present

## 2016-09-27 DIAGNOSIS — I110 Hypertensive heart disease with heart failure: Secondary | ICD-10-CM | POA: Diagnosis not present

## 2016-09-27 DIAGNOSIS — I2510 Atherosclerotic heart disease of native coronary artery without angina pectoris: Secondary | ICD-10-CM | POA: Diagnosis not present

## 2016-09-27 DIAGNOSIS — Z9981 Dependence on supplemental oxygen: Secondary | ICD-10-CM | POA: Diagnosis not present

## 2016-09-29 DIAGNOSIS — Z9981 Dependence on supplemental oxygen: Secondary | ICD-10-CM | POA: Diagnosis not present

## 2016-09-29 DIAGNOSIS — I110 Hypertensive heart disease with heart failure: Secondary | ICD-10-CM | POA: Diagnosis not present

## 2016-09-29 DIAGNOSIS — I5032 Chronic diastolic (congestive) heart failure: Secondary | ICD-10-CM | POA: Diagnosis not present

## 2016-09-29 DIAGNOSIS — J9621 Acute and chronic respiratory failure with hypoxia: Secondary | ICD-10-CM | POA: Diagnosis not present

## 2016-09-29 DIAGNOSIS — J439 Emphysema, unspecified: Secondary | ICD-10-CM | POA: Diagnosis not present

## 2016-09-29 DIAGNOSIS — I2510 Atherosclerotic heart disease of native coronary artery without angina pectoris: Secondary | ICD-10-CM | POA: Diagnosis not present

## 2016-09-29 DIAGNOSIS — Z792 Long term (current) use of antibiotics: Secondary | ICD-10-CM | POA: Diagnosis not present

## 2016-09-30 DIAGNOSIS — G4733 Obstructive sleep apnea (adult) (pediatric): Secondary | ICD-10-CM | POA: Diagnosis not present

## 2016-10-02 DIAGNOSIS — Z792 Long term (current) use of antibiotics: Secondary | ICD-10-CM | POA: Diagnosis not present

## 2016-10-02 DIAGNOSIS — I5032 Chronic diastolic (congestive) heart failure: Secondary | ICD-10-CM | POA: Diagnosis not present

## 2016-10-02 DIAGNOSIS — I110 Hypertensive heart disease with heart failure: Secondary | ICD-10-CM | POA: Diagnosis not present

## 2016-10-02 DIAGNOSIS — J439 Emphysema, unspecified: Secondary | ICD-10-CM | POA: Diagnosis not present

## 2016-10-02 DIAGNOSIS — I2510 Atherosclerotic heart disease of native coronary artery without angina pectoris: Secondary | ICD-10-CM | POA: Diagnosis not present

## 2016-10-02 DIAGNOSIS — J9621 Acute and chronic respiratory failure with hypoxia: Secondary | ICD-10-CM | POA: Diagnosis not present

## 2016-10-02 DIAGNOSIS — Z9981 Dependence on supplemental oxygen: Secondary | ICD-10-CM | POA: Diagnosis not present

## 2016-10-03 ENCOUNTER — Other Ambulatory Visit
Admission: RE | Admit: 2016-10-03 | Discharge: 2016-10-03 | Disposition: A | Discharge status: Home / Self Care | Payer: Medicare Other | Source: Ambulatory Visit | Attending: Internal Medicine | Admitting: Internal Medicine

## 2016-10-03 DIAGNOSIS — I10 Essential (primary) hypertension: Secondary | ICD-10-CM | POA: Diagnosis not present

## 2016-10-03 DIAGNOSIS — Z0001 Encounter for general adult medical examination with abnormal findings: Secondary | ICD-10-CM | POA: Insufficient documentation

## 2016-10-03 DIAGNOSIS — Z125 Encounter for screening for malignant neoplasm of prostate: Secondary | ICD-10-CM | POA: Insufficient documentation

## 2016-10-03 DIAGNOSIS — J441 Chronic obstructive pulmonary disease with (acute) exacerbation: Secondary | ICD-10-CM | POA: Diagnosis not present

## 2016-10-03 LAB — CBC
HCT: 41.6 % (ref 40.0–52.0)
HEMOGLOBIN: 14.1 g/dL (ref 13.0–18.0)
MCH: 32 pg (ref 26.0–34.0)
MCHC: 33.8 g/dL (ref 32.0–36.0)
MCV: 94.9 fL (ref 80.0–100.0)
Platelets: 186 10*3/uL (ref 150–440)
RBC: 4.39 MIL/uL — AB (ref 4.40–5.90)
RDW: 13.5 % (ref 11.5–14.5)
WBC: 7.4 10*3/uL (ref 3.8–10.6)

## 2016-10-03 LAB — COMPREHENSIVE METABOLIC PANEL
ALBUMIN: 4.2 g/dL (ref 3.5–5.0)
ALK PHOS: 56 U/L (ref 38–126)
ALT: 12 U/L — AB (ref 17–63)
ANION GAP: 6 (ref 5–15)
AST: 21 U/L (ref 15–41)
BUN: 15 mg/dL (ref 6–20)
CALCIUM: 9.5 mg/dL (ref 8.9–10.3)
CHLORIDE: 101 mmol/L (ref 101–111)
CO2: 33 mmol/L — AB (ref 22–32)
Creatinine, Ser: 1.14 mg/dL (ref 0.61–1.24)
GFR calc Af Amer: >60 mL/min (ref >60)
GFR calc non Af Amer: >60 mL/min (ref >60)
GLUCOSE: 97 mg/dL (ref 65–99)
Potassium: 4.2 mmol/L (ref 3.5–5.1)
SODIUM: 140 mmol/L (ref 135–145)
Total Bilirubin: 0.8 mg/dL (ref 0.3–1.2)
Total Protein: 7.1 g/dL (ref 6.5–8.1)

## 2016-10-03 LAB — VITAMIN B12: Vitamin B-12: 374 pg/mL (ref 180–914)

## 2016-10-03 LAB — MAGNESIUM: Magnesium: 2 mg/dL (ref 1.7–2.4)

## 2016-10-03 LAB — FOLATE: FOLATE: 21.9 ng/mL (ref >5.9)

## 2016-10-06 DIAGNOSIS — I2510 Atherosclerotic heart disease of native coronary artery without angina pectoris: Secondary | ICD-10-CM | POA: Diagnosis not present

## 2016-10-06 DIAGNOSIS — Z792 Long term (current) use of antibiotics: Secondary | ICD-10-CM | POA: Diagnosis not present

## 2016-10-06 DIAGNOSIS — I5032 Chronic diastolic (congestive) heart failure: Secondary | ICD-10-CM | POA: Diagnosis not present

## 2016-10-06 DIAGNOSIS — Z9981 Dependence on supplemental oxygen: Secondary | ICD-10-CM | POA: Diagnosis not present

## 2016-10-06 DIAGNOSIS — J9621 Acute and chronic respiratory failure with hypoxia: Secondary | ICD-10-CM | POA: Diagnosis not present

## 2016-10-06 DIAGNOSIS — I110 Hypertensive heart disease with heart failure: Secondary | ICD-10-CM | POA: Diagnosis not present

## 2016-10-06 DIAGNOSIS — J439 Emphysema, unspecified: Secondary | ICD-10-CM | POA: Diagnosis not present

## 2016-10-09 DIAGNOSIS — J441 Chronic obstructive pulmonary disease with (acute) exacerbation: Secondary | ICD-10-CM | POA: Diagnosis not present

## 2016-10-09 DIAGNOSIS — J9611 Chronic respiratory failure with hypoxia: Secondary | ICD-10-CM | POA: Diagnosis not present

## 2016-10-09 DIAGNOSIS — G2581 Restless legs syndrome: Secondary | ICD-10-CM | POA: Diagnosis not present

## 2016-10-09 DIAGNOSIS — R000 Tachycardia, unspecified: Secondary | ICD-10-CM | POA: Diagnosis not present

## 2016-10-19 DIAGNOSIS — J449 Chronic obstructive pulmonary disease, unspecified: Secondary | ICD-10-CM | POA: Diagnosis not present

## 2016-10-20 ENCOUNTER — Other Ambulatory Visit: Payer: Self-pay | Admitting: Pharmacist

## 2016-10-20 NOTE — Patient Outreach (Signed)
[name]) Care Management  10/20/2016  Hershal CoriaDennis R Hirschi [birth year] [id number]  Incoming call received from patient.  HIPAA details verified, he is familiar to this [facility] Pharmacist from previous [facility] patient engagement.   Patient states he has had medication changes and is in need of patient assistance evaluation.  He states PCP (Dr Welton FlakesKhan) office is trying to work with him on medication cost.  He wasn't sure which medications in particular.    Patient reports he still has [facility] [employer] Medicare Advantage.    Plan:  Will have [facility] CMA verify patient is still [facility] eligible.  If patient is [facility] eligible, will make outreach call to Dr Milta DeitersKhan's office to verify current medications and status of patient assistance paperwork.    Patient aware [facility] Pharmacist will follow-up with him, but will need to verify medication list with his prescriber first.    Tommye StandardKevin Tammra Pressman, PharmD, [facility]BCACP Clinical Pharmacist [name] [phone]

## 2016-10-24 ENCOUNTER — Other Ambulatory Visit: Payer: Self-pay | Admitting: Pharmacist

## 2016-10-24 NOTE — Patient Outreach (Signed)
[name](THN) Care Management  10/24/2016  Dustin [name] January 10, 1956 [id number]  Care coordination phone call to PCP, Dr Welton FlakesKhan.  Patient had reported working with Dustin [name] at PCP office regarding patient assistance.    Message left requesting return call to follow-up on what patient assistance PCP office has completed and obtain current medication list.   Plan:  Will await return call from PCP office.   Tommye StandardKevin Lyden Redner, PharmD, [facility] Falls[facility]ACP Clinical Pharmacist [name] [phone]

## 2016-10-26 ENCOUNTER — Other Ambulatory Visit: Payer: Self-pay | Admitting: Pharmacist

## 2016-10-26 NOTE — Patient Outreach (Signed)
[facility]) Care Management  10/26/2016  Dustin [name] [birth year] [id number]  0948:  Second follow-up call placed to Dr Laurelyn Sickle office to speak with Dustin [name] regarding patient's medication assistance.    She reports she is working on Location manager for [employer]) and [place] for patient.  Reminded her that Merck also makes an albuterol inhaler which patient could apply for.    She states other medication patient was going to apply for is Daliresp.  [place] patient assistance requires Part D beneficiaries meet an out-of-pocket prescription drug spend to qualify and patient has not met this yet for 2018.  1005:   Made phone call to patient, HIPAA details verified.  Updated patient on [facility] Pharmacist phone call to PCP.  Discussed [employer] Patient assistance program requirements for Part D beneficiaries, and patient will need to meet out-of-pocket prescription spend to qualify.    Patient mentioned he may like to review his medications.  Offered patient a [facility] Pharmacist home visit---he plans to think about this.    Plan:  Will make phone outreach to patient next week to follow-up if he wishes to have [facility] Pharmacist home visit to review his medications.    Karrie Meres, PharmD, [place] [phone]

## 2016-10-31 DIAGNOSIS — G4733 Obstructive sleep apnea (adult) (pediatric): Secondary | ICD-10-CM | POA: Diagnosis not present

## 2016-11-02 ENCOUNTER — Other Ambulatory Visit: Payer: Self-pay | Admitting: Pharmacist

## 2016-11-02 NOTE — Patient Outreach (Signed)
[name]N) Care Management  11/02/2016  Dustin [name] 1955/10/26 [id number]  Unsuccessful follow-up attempt to patient regarding if he heard anything further from his PCP regarding patient assistance.   No answer, HIPAA compliant voice message left requesting return call.   Plan:  If no return call, will make another attempt next week.   Tommye StandardKevin Abisola Carrero, PharmD, [facility]ACP Clinical Pharmacist [name] [phone]

## 2016-11-08 ENCOUNTER — Other Ambulatory Visit: Payer: Self-pay | Admitting: Pharmacist

## 2016-11-08 NOTE — Patient Outreach (Signed)
[name]) Care Management  11/08/2016  Dustin CoriaDennis R [name] November 27, 1955 [id number]  Successful phone outreach to patient, HIPAA details verified.  Patient states that he has not heard from PCP office regarding patient assistance for [employer]ck or [employer] patient assistance programs.  Patient reports he completed paperwork for both programs and gave to his PCP office to be submitted for evaluation for respective patient assistance programs.    Plan:  Will attempt to follow-up with PCP office regarding status of patient assistance applications.   Will continue to follow-up with patient.   Tommye StandardKevin Wister Hoefle, PharmD, [facility] CenterBCACP Clinical Pharmacist [name] [phone]

## 2016-11-10 ENCOUNTER — Other Ambulatory Visit: Payer: Self-pay | Admitting: Pharmacist

## 2016-11-10 NOTE — Patient Outreach (Signed)
[name](THN) Care Management  11/10/2016  Hershal CoriaDennis R Azzara 1956/09/04 [id number]  Phone call placed to Dr Milta DeitersKhan's office (PCP) to follow-up on status of patient assistance applications for patient.  Spoke with Kyla Balzarineatiana, who states Merck and [employer] patient assistance applications have been submitted.  She reports patient's insurance denied Tier Exception request for [employer]ali[employer].   Phone call to patient, HIPAA details verified.  Updated patient that per PCP office, Merck and [employer] patient assistance applications were submitted by his PCP office.  Unable to tell patient how long it will take to find out if he is eligible or not this year.   Patient asked how Dulera and Advair strengths compared.  Counseled patient strength may be different because they are different drugs within the same drug class.  He verbalized understanding.   Patient reports he is still thinking about if he would like for [facility] Pharmacist to complete home visit to review his medications or not.   Plan:  Will continue to follow-up with patient regarding patient assistance and will follow-up if patient is interested in a home visit for medication review.   Tommye StandardKevin Rajon Bisig, PharmD, [facility]BCACP Clinical Pharmacist [name] [phone]

## 2016-11-14 ENCOUNTER — Other Ambulatory Visit
Admission: RE | Admit: 2016-11-14 | Discharge: 2016-11-14 | Disposition: A | Discharge status: Home / Self Care | Payer: Medicare Other | Source: Ambulatory Visit | Attending: Internal Medicine | Admitting: Internal Medicine

## 2016-11-14 DIAGNOSIS — J9621 Acute and chronic respiratory failure with hypoxia: Secondary | ICD-10-CM | POA: Insufficient documentation

## 2016-11-14 DIAGNOSIS — M792 Neuralgia and neuritis, unspecified: Secondary | ICD-10-CM | POA: Diagnosis not present

## 2016-11-14 DIAGNOSIS — D519 Vitamin B12 deficiency anemia, unspecified: Secondary | ICD-10-CM | POA: Diagnosis not present

## 2016-11-14 DIAGNOSIS — J441 Chronic obstructive pulmonary disease with (acute) exacerbation: Secondary | ICD-10-CM | POA: Insufficient documentation

## 2016-11-15 ENCOUNTER — Other Ambulatory Visit (HOSPITAL_COMMUNITY): Payer: Self-pay | Admitting: Internal Medicine

## 2016-11-15 DIAGNOSIS — R05 Cough: Secondary | ICD-10-CM

## 2016-11-15 DIAGNOSIS — R059 Cough, unspecified: Secondary | ICD-10-CM

## 2016-11-15 LAB — EXPECTORATED SPUTUM ASSESSMENT W GRAM STAIN, RFLX TO RESP C

## 2016-11-15 LAB — EXPECTORATED SPUTUM ASSESSMENT W REFEX TO RESP CULTURE

## 2016-11-16 DIAGNOSIS — J449 Chronic obstructive pulmonary disease, unspecified: Secondary | ICD-10-CM | POA: Diagnosis not present

## 2016-11-17 ENCOUNTER — Other Ambulatory Visit
Admission: RE | Admit: 2016-11-17 | Discharge: 2016-11-17 | Disposition: A | Discharge status: Home / Self Care | Payer: Medicare Other | Source: Ambulatory Visit | Attending: Internal Medicine | Admitting: Internal Medicine

## 2016-11-17 DIAGNOSIS — Z029 Encounter for administrative examinations, unspecified: Secondary | ICD-10-CM | POA: Insufficient documentation

## 2016-11-17 LAB — EXPECTORATED SPUTUM ASSESSMENT W REFEX TO RESP CULTURE

## 2016-11-17 LAB — EXPECTORATED SPUTUM ASSESSMENT W GRAM STAIN, RFLX TO RESP C

## 2016-11-20 LAB — CULTURE, RESPIRATORY W GRAM STAIN: Culture: NORMAL

## 2016-11-20 LAB — CULTURE, RESPIRATORY

## 2016-11-23 ENCOUNTER — Encounter (HOSPITAL_COMMUNITY): Payer: Self-pay

## 2016-11-23 ENCOUNTER — Ambulatory Visit (HOSPITAL_COMMUNITY)
Admission: RE | Admit: 2016-11-23 | Discharge: 2016-11-23 | Disposition: A | Discharge status: Home / Self Care | Payer: Medicare Other | Source: Ambulatory Visit | Attending: Internal Medicine | Admitting: Internal Medicine

## 2016-11-23 DIAGNOSIS — R911 Solitary pulmonary nodule: Secondary | ICD-10-CM | POA: Insufficient documentation

## 2016-11-23 DIAGNOSIS — R059 Cough, unspecified: Secondary | ICD-10-CM

## 2016-11-23 DIAGNOSIS — R05 Cough: Secondary | ICD-10-CM | POA: Insufficient documentation

## 2016-11-23 LAB — POCT I-STAT CREATININE: CREATININE: 1.3 mg/dL — AB (ref 0.61–1.24)

## 2016-11-23 MED ORDER — IOPAMIDOL (ISOVUE-300) INJECTION 61%
75.0000 mL | Freq: Once | INTRAVENOUS | Status: AC | PRN
  Administered 2016-11-23: 75 mL via INTRAVENOUS

## 2016-11-27 ENCOUNTER — Other Ambulatory Visit: Payer: Self-pay | Admitting: Pharmacist

## 2016-11-27 NOTE — Patient Outreach (Signed)
[name]N) Care Management  11/27/2016  Dustin CoriaDennis R [name] 1956/08/26 [id number]  Follow-up with patient and Dustin [name] with Dr Milta DeitersKhan's office regarding status of patient assistance applications for patient.    [id number]:  Phone call to Dr Milta DeitersKhan's office, spoke with Dustin [name].  She reports no update on [employer] patient Dustin [name] application or Merck application.  Requested she re-fax [employer] application and she may need to have patient complete new Merck application given they require original application be mailed.    [id number]:  Phone call to patient, HIPAA details verified.  Updated patient that [facility] requested Dr Milta DeitersKhan's office re-fax [employer] application.    Patient reported getting a denial letter for [facility] for Extra Help---he didn't remember receiving Extra Help before.  Offered a home visit with patient to review medications and his SSA Extra Help letter---he declined at this time.   Plan:  Will continue to follow-up with patient and Dr Milta DeitersKhan's office regarding his patient assistance applications.     Dustin [name], PharmD, [facility]BCACP Clinical Pharmacist [name] [phone]

## 2016-11-28 DIAGNOSIS — G4733 Obstructive sleep apnea (adult) (pediatric): Secondary | ICD-10-CM | POA: Diagnosis not present

## 2016-12-01 DIAGNOSIS — G4733 Obstructive sleep apnea (adult) (pediatric): Secondary | ICD-10-CM | POA: Diagnosis not present

## 2016-12-01 DIAGNOSIS — J449 Chronic obstructive pulmonary disease, unspecified: Secondary | ICD-10-CM | POA: Diagnosis not present

## 2016-12-01 DIAGNOSIS — A4902 Methicillin resistant Staphylococcus aureus infection, unspecified site: Secondary | ICD-10-CM | POA: Diagnosis not present

## 2016-12-01 DIAGNOSIS — J471 Bronchiectasis with (acute) exacerbation: Secondary | ICD-10-CM | POA: Diagnosis not present

## 2016-12-04 DIAGNOSIS — R911 Solitary pulmonary nodule: Secondary | ICD-10-CM | POA: Diagnosis not present

## 2016-12-04 DIAGNOSIS — G4733 Obstructive sleep apnea (adult) (pediatric): Secondary | ICD-10-CM | POA: Diagnosis not present

## 2016-12-04 DIAGNOSIS — J9611 Chronic respiratory failure with hypoxia: Secondary | ICD-10-CM | POA: Diagnosis not present

## 2016-12-04 DIAGNOSIS — I5032 Chronic diastolic (congestive) heart failure: Secondary | ICD-10-CM | POA: Diagnosis not present

## 2016-12-04 DIAGNOSIS — J15212 Pneumonia due to Methicillin resistant Staphylococcus aureus: Secondary | ICD-10-CM | POA: Diagnosis not present

## 2016-12-11 ENCOUNTER — Other Ambulatory Visit: Payer: Self-pay | Admitting: Pharmacist

## 2016-12-11 NOTE — Patient Outreach (Signed)
[name]) Care Management  12/11/2016  Dustin CoriaDennis R Gotsch 22-Jun-1956 [id number]  Successful phone follow-up to patient.  Patient reports he has not heard anything on his patient assistance applications---he reports he is seeing Dr Welton FlakesKhan tomorrow.  Advised patient no update on his patient assistance applications has been received by [facility] Pharmac[facility] despite calls to PCP office.    Counseled patient to follow-up with his prescriber office at his upcoming appointment he reports he has---he agrees to this.   Plan:  Will place follow-up call to patient in next week to follow-up with patient what he learned from prescriber office regarding patient assistance applications.   Tommye StandardKevin Kataya [name], PharmD, [facility] CenterBCACP Clinical Pharmacist [name] [phone]

## 2016-12-12 DIAGNOSIS — J441 Chronic obstructive pulmonary disease with (acute) exacerbation: Secondary | ICD-10-CM | POA: Diagnosis not present

## 2016-12-12 DIAGNOSIS — J9621 Acute and chronic respiratory failure with hypoxia: Secondary | ICD-10-CM | POA: Diagnosis not present

## 2016-12-13 ENCOUNTER — Other Ambulatory Visit: Payer: Self-pay | Admitting: Pharmacist

## 2016-12-13 NOTE — Patient Outreach (Signed)
[name]) Care Management  12/13/2016  Hershal CoriaDennis R [place] [birth year] [id number]  Inbound call from patient.  Patient reports he followed-up with Dr Burney GauzeF Khan 12/13/16 and reports he was told by PCP office patient assistance paperwork was re-sent.   Patient reports he was started on doxycycline---he asked about when he should take his magnesium and what he should do if he is out in the sun.  Counseled patient to separate his magnesium and doxycycline by taking magnesium 2 hours before or 4 hours after doxycycline.   Counseled patient doxycycline can increase photosensitivity so he should take steps to reduce his sun exposure such as wearing sun screen, wide brimmed hat, etc.    Patient denied other questions at this time and verbalized understanding.    Plan:  Will continue to follow-up with patient regarding patient assistance---his PCP office has the paperwork and patient is aware it is up to PCP office to send his application to respective patient assistance programs.    Tommye StandardKevin Marleny Faller, PharmD, [facility] CenterBCACP Clinical Pharmacist [name] [phone]

## 2016-12-15 ENCOUNTER — Ambulatory Visit: Payer: Self-pay | Admitting: Pharmacist

## 2016-12-17 DIAGNOSIS — J449 Chronic obstructive pulmonary disease, unspecified: Secondary | ICD-10-CM | POA: Diagnosis not present

## 2016-12-25 ENCOUNTER — Other Ambulatory Visit: Payer: Self-pay | Admitting: Pharmacist

## 2016-12-25 NOTE — Patient Outreach (Addendum)
[name]) Care Management  12/25/2016  Dustin [name] 1956-06-20 [id number]  Unsuccessful phone outreach to patient to follow-up if he has heard anything from his PCP regarding his patient assistance applications.   No answer, HIPAA compliant message left requesting return call.   Plan:  If no return call, will make another outreach attempt in the next week.   Dustin [name], PharmD, [facility] Clinical Pharmacist [name] [phone]   Addendum:  Patient returned call to [facility] CM Pharmacist.  He reports he has not heard any updates on his patient assistance applications for Spiriva or Dulera from his PCP office.  Patient reports that he completed and mailed an application for [facility] Extra Help about 2 weeks ago.  Counseled patient SSA typically mails beneficiaries a letter with eligibility decision in 2-4 weeks and to look out for letter.  Advised patient [facility] CM Pharmacist will place another follow-up call to [facility] at Dr Milta Deiters office whom patient reports is assisting him with patient assistance applications to request an update on his applications.   Placed call to Dr Milta Deiters office, left message requesting return call.   Plan:  Will continue to follow-up patient's applications for patient assistance.    Dustin [name], PharmD, [facility] Clinical Pharmacist [name] [phone]

## 2016-12-29 ENCOUNTER — Other Ambulatory Visit: Payer: Self-pay | Admitting: Pharmacist

## 2016-12-29 DIAGNOSIS — G4733 Obstructive sleep apnea (adult) (pediatric): Secondary | ICD-10-CM | POA: Diagnosis not present

## 2016-12-29 NOTE — Patient Outreach (Signed)
[name]) Care Management  12/29/2016  Dustin [name] Mar 03, 1956 [id number]  Successful phone outreach to patient, HIPAA details verified, purpose of call to follow-up on his [employer] patient assistance application.    Conference call completed with patient to [employer]---representative reports patient's application was received but was missing a prescription and patient needed to provide answers to two questions on application, which patient provided.  Representative reports entire application needs to be re-faxed for evaluation.  Call was disconnected with [employer].   Advised patient [facility] Pharmacist will contact PCP office and speak with Kyla Balzarine regarding need for application to be re-faxed with a prescription.   Placed call to Dr Milta Deiters office, spoke with Kyla Balzarine and updated her on the above regarding [employer] reports she will re-fax application.  She also reported she faxed Merck patient assistance application---discussed with her that typically Merck requires the original application be mailed to [employer] for evaluation and that patient assistance program may not accept a faxed application.   Plan:  Will continue to follow-up with patient regarding his patient assistance applications.   [name], PharmD, [facility] Clinical Pharmacist [name] [phone]

## 2017-01-05 ENCOUNTER — Other Ambulatory Visit: Payer: Self-pay | Admitting: Pharmacist

## 2017-01-05 NOTE — Patient Outreach (Signed)
[name]) Care Management  01/05/2017  LONDELL NOLL 05-17-1956 [id number]  Successful phone outreach to patient to follow-up his patient assistance paperwork.  Patient reports he has not heard anything from his prescriber's office or patient assistance programs.   Left a message with Kyla Balzarine at Dr Rosine Beat' office requesting a return call with an update on patient's application for [employer].    Plan:  Advised patient will follow-up with his prescriber's office next week.  If no response from prescirber office will consider discussing case with [facility] Assistant Clinical Director of Pharmacy.    [name], PharmD, [facility] Clinical Pharmacist [name] [phone]

## 2017-01-08 ENCOUNTER — Other Ambulatory Visit: Payer: Self-pay | Admitting: Pharmacist

## 2017-01-08 NOTE — Patient Outreach (Signed)
[name]) Care Management  01/08/2017  Dustin [name] 1956-02-09 [id number]  Second attempt to follow-up with Dr Milta Deiters office regarding patient assistance application for tiotropium (Spiriva) from [employer].  Spoke with Kyla Balzarine who reports application was just re-faxed this morning.  She reports she mailed the [employer] Patient Assistance application but does not recall when it was mailed.    She asked about patient's out-of-pocket prescription spend year to date for [employer] application for Daliresp, unfortunately, [facility] Pharmacist does not have this information---will ask patient at next phone follow-up with him.   Plan:  Will continue to follow-up patient's applications for patient assistance and coordinate with Dr Milta Deiters office as necessary.    [name], PharmD, [facility] Clinical Pharmacist [name] [phone]

## 2017-01-12 ENCOUNTER — Other Ambulatory Visit: Payer: Self-pay | Admitting: Pharmacist

## 2017-01-12 NOTE — Patient Outreach (Signed)
[name]) Care Management  01/12/2017  Dustin [name] 07/23/1956 [id number]  Phone follow-up completed to [employer] regarding patient's application for assistance with Spiriva.  Representative reports application was received this week but has not been processed.    Successful phone outreach to patient, HIPAA details verified.  Updated patient on the above regarding his [employer].  He denies questions at this time.   Plan:  Will continue to follow-up with patient regarding patient assistance application.   [name], PharmD, [facility] Clinical Pharmacist [name] [phone]

## 2017-01-16 DIAGNOSIS — J449 Chronic obstructive pulmonary disease, unspecified: Secondary | ICD-10-CM | POA: Diagnosis not present

## 2017-01-22 ENCOUNTER — Inpatient Hospital Stay
Admission: EM | Admit: 2017-01-22 | Discharge: 2017-01-29 | DRG: 193 | Disposition: A | Discharge status: Home Health | Payer: Medicare Other | Attending: Internal Medicine | Admitting: Internal Medicine

## 2017-01-22 ENCOUNTER — Emergency Department: Payer: Medicare Other

## 2017-01-22 ENCOUNTER — Encounter: Payer: Self-pay | Admitting: Emergency Medicine

## 2017-01-22 DIAGNOSIS — Z79899 Other long term (current) drug therapy: Secondary | ICD-10-CM

## 2017-01-22 DIAGNOSIS — I10 Essential (primary) hypertension: Secondary | ICD-10-CM | POA: Diagnosis not present

## 2017-01-22 DIAGNOSIS — R05 Cough: Secondary | ICD-10-CM | POA: Diagnosis not present

## 2017-01-22 DIAGNOSIS — E875 Hyperkalemia: Secondary | ICD-10-CM | POA: Diagnosis not present

## 2017-01-22 DIAGNOSIS — Y95 Nosocomial condition: Secondary | ICD-10-CM | POA: Diagnosis present

## 2017-01-22 DIAGNOSIS — F419 Anxiety disorder, unspecified: Secondary | ICD-10-CM | POA: Diagnosis present

## 2017-01-22 DIAGNOSIS — Z886 Allergy status to analgesic agent status: Secondary | ICD-10-CM | POA: Diagnosis not present

## 2017-01-22 DIAGNOSIS — F17290 Nicotine dependence, other tobacco product, uncomplicated: Secondary | ICD-10-CM | POA: Diagnosis present

## 2017-01-22 DIAGNOSIS — Z91030 Bee allergy status: Secondary | ICD-10-CM | POA: Diagnosis not present

## 2017-01-22 DIAGNOSIS — G629 Polyneuropathy, unspecified: Secondary | ICD-10-CM | POA: Diagnosis not present

## 2017-01-22 DIAGNOSIS — J189 Pneumonia, unspecified organism: Secondary | ICD-10-CM | POA: Diagnosis not present

## 2017-01-22 DIAGNOSIS — R0602 Shortness of breath: Secondary | ICD-10-CM | POA: Diagnosis not present

## 2017-01-22 DIAGNOSIS — G2581 Restless legs syndrome: Secondary | ICD-10-CM | POA: Diagnosis not present

## 2017-01-22 DIAGNOSIS — J441 Chronic obstructive pulmonary disease with (acute) exacerbation: Secondary | ICD-10-CM | POA: Diagnosis not present

## 2017-01-22 DIAGNOSIS — F329 Major depressive disorder, single episode, unspecified: Secondary | ICD-10-CM | POA: Diagnosis present

## 2017-01-22 DIAGNOSIS — J9621 Acute and chronic respiratory failure with hypoxia: Secondary | ICD-10-CM | POA: Diagnosis not present

## 2017-01-22 DIAGNOSIS — R0902 Hypoxemia: Secondary | ICD-10-CM | POA: Diagnosis not present

## 2017-01-22 DIAGNOSIS — Z885 Allergy status to narcotic agent status: Secondary | ICD-10-CM | POA: Diagnosis not present

## 2017-01-22 DIAGNOSIS — G4733 Obstructive sleep apnea (adult) (pediatric): Secondary | ICD-10-CM | POA: Diagnosis not present

## 2017-01-22 DIAGNOSIS — Z9981 Dependence on supplemental oxygen: Secondary | ICD-10-CM

## 2017-01-22 DIAGNOSIS — Z9119 Patient's noncompliance with other medical treatment and regimen: Secondary | ICD-10-CM | POA: Diagnosis not present

## 2017-01-22 DIAGNOSIS — Z8249 Family history of ischemic heart disease and other diseases of the circulatory system: Secondary | ICD-10-CM | POA: Diagnosis not present

## 2017-01-22 LAB — CBC WITH DIFFERENTIAL/PLATELET
BASOS ABS: 0 10*3/uL (ref 0–0.1)
BASOS PCT: 1 %
Eosinophils Absolute: 0.1 10*3/uL (ref 0–0.7)
Eosinophils Relative: 2 %
HEMATOCRIT: 39.8 % — AB (ref 40.0–52.0)
HEMOGLOBIN: 13.4 g/dL (ref 13.0–18.0)
LYMPHS PCT: 19 %
Lymphs Abs: 1.5 10*3/uL (ref 1.0–3.6)
MCH: 32.5 pg (ref 26.0–34.0)
MCHC: 33.8 g/dL (ref 32.0–36.0)
MCV: 95.9 fL (ref 80.0–100.0)
MONO ABS: 0.8 10*3/uL (ref 0.2–1.0)
Monocytes Relative: 10 %
NEUTROS ABS: 5.5 10*3/uL (ref 1.4–6.5)
NEUTROS PCT: 68 %
Platelets: 235 10*3/uL (ref 150–440)
RBC: 4.14 MIL/uL — AB (ref 4.40–5.90)
RDW: 13.3 % (ref 11.5–14.5)
WBC: 7.9 10*3/uL (ref 3.8–10.6)

## 2017-01-22 LAB — BLOOD GAS, VENOUS
Acid-Base Excess: 8.3 mmol/L — ABNORMAL HIGH (ref 0.0–2.0)
Bicarbonate: 38 mmol/L — ABNORMAL HIGH (ref 20.0–28.0)
FIO2: 0.44
O2 Saturation: 72.4 %
PCO2 VEN: 79 mmHg — AB (ref 44.0–60.0)
PH VEN: 7.29 (ref 7.250–7.430)
PO2 VEN: 43 mmHg (ref 32.0–45.0)
Patient temperature: 37

## 2017-01-22 LAB — COMPREHENSIVE METABOLIC PANEL
ALBUMIN: 3.6 g/dL (ref 3.5–5.0)
ALK PHOS: 89 U/L (ref 38–126)
ALT: 26 U/L (ref 17–63)
AST: 22 U/L (ref 15–41)
Anion gap: 9 (ref 5–15)
BILIRUBIN TOTAL: 0.6 mg/dL (ref 0.3–1.2)
BUN: 12 mg/dL (ref 6–20)
CALCIUM: 8.8 mg/dL — AB (ref 8.9–10.3)
CO2: 33 mmol/L — ABNORMAL HIGH (ref 22–32)
Chloride: 97 mmol/L — ABNORMAL LOW (ref 101–111)
Creatinine, Ser: 1.07 mg/dL (ref 0.61–1.24)
GFR calc Af Amer: >60 mL/min (ref >60)
GLUCOSE: 97 mg/dL (ref 65–99)
POTASSIUM: 3.5 mmol/L (ref 3.5–5.1)
Sodium: 139 mmol/L (ref 135–145)
TOTAL PROTEIN: 7.2 g/dL (ref 6.5–8.1)

## 2017-01-22 LAB — BRAIN NATRIURETIC PEPTIDE: B NATRIURETIC PEPTIDE 5: 200 pg/mL — AB (ref 0.0–100.0)

## 2017-01-22 LAB — TROPONIN I: Troponin I: <0.03 ng/mL (ref <0.03)

## 2017-01-22 LAB — EXPECTORATED SPUTUM ASSESSMENT W REFEX TO RESP CULTURE: SPECIAL REQUESTS: NORMAL

## 2017-01-22 LAB — LACTIC ACID, PLASMA: Lactic Acid, Venous: 0.9 mmol/L (ref 0.5–1.9)

## 2017-01-22 LAB — PROCALCITONIN: PROCALCITONIN: 0.15 ng/mL

## 2017-01-22 LAB — EXPECTORATED SPUTUM ASSESSMENT W GRAM STAIN, RFLX TO RESP C

## 2017-01-22 MED ORDER — MAGNESIUM SULFATE 2 GM/50ML IV SOLN
2.0000 g | Freq: Once | INTRAVENOUS | Status: AC
  Administered 2017-01-22: 2 g via INTRAVENOUS
  Filled 2017-01-22: qty 50

## 2017-01-22 MED ORDER — DEXTROSE 5 % IV SOLN
1.0000 g | Freq: Once | INTRAVENOUS | Status: AC
  Administered 2017-01-22: 1 g via INTRAVENOUS
  Filled 2017-01-22: qty 10

## 2017-01-22 MED ORDER — ONDANSETRON HCL 4 MG/2ML IJ SOLN: 4.0000 mg | Freq: Four times a day (QID) | INTRAMUSCULAR | Status: DC | PRN

## 2017-01-22 MED ORDER — THEOPHYLLINE ER 400 MG PO TB24
400.0000 mg | ORAL_TABLET | Freq: Two times a day (BID) | ORAL | Status: DC
  Filled 2017-01-22: qty 1

## 2017-01-22 MED ORDER — POTASSIUM CHLORIDE CRYS ER 10 MEQ PO TBCR
10.0000 meq | EXTENDED_RELEASE_TABLET | Freq: Once | ORAL | Status: AC
  Administered 2017-01-22: 10 meq via ORAL
  Filled 2017-01-22: qty 1

## 2017-01-22 MED ORDER — METHYLPREDNISOLONE SODIUM SUCC 125 MG IJ SOLR
125.0000 mg | Freq: Once | INTRAMUSCULAR | Status: AC
  Administered 2017-01-22: 125 mg via INTRAVENOUS
  Filled 2017-01-22: qty 2

## 2017-01-22 MED ORDER — IPRATROPIUM-ALBUTEROL 0.5-2.5 (3) MG/3ML IN SOLN
3.0000 mL | Freq: Once | RESPIRATORY_TRACT | Status: AC
  Administered 2017-01-22: 3 mL via RESPIRATORY_TRACT
  Filled 2017-01-22: qty 3

## 2017-01-22 MED ORDER — THEOPHYLLINE ER 200 MG PO CP24
400.0000 mg | ORAL_CAPSULE | Freq: Two times a day (BID) | ORAL | Status: DC
  Administered 2017-01-22 – 2017-01-29 (×14): 400 mg via ORAL
  Filled 2017-01-22 (×15): qty 2

## 2017-01-22 MED ORDER — ONDANSETRON HCL 4 MG PO TABS: 4.0000 mg | ORAL_TABLET | Freq: Four times a day (QID) | ORAL | Status: DC | PRN

## 2017-01-22 MED ORDER — DEXTROSE 5 % IV SOLN
1.0000 g | INTRAVENOUS | Status: DC
  Administered 2017-01-23: 1 g via INTRAVENOUS
  Filled 2017-01-22 (×2): qty 10

## 2017-01-22 MED ORDER — DEXTROSE 5 % IV SOLN
500.0000 mg | Freq: Once | INTRAVENOUS | Status: AC
  Administered 2017-01-22: 500 mg via INTRAVENOUS
  Filled 2017-01-22: qty 500

## 2017-01-22 MED ORDER — THEOPHYLLINE ER 200 MG PO TB12
400.0000 mg | ORAL_TABLET | Freq: Two times a day (BID) | ORAL | Status: DC
  Filled 2017-01-22: qty 2

## 2017-01-22 MED ORDER — ACETAMINOPHEN 650 MG RE SUPP: 650.0000 mg | Freq: Four times a day (QID) | RECTAL | Status: DC | PRN

## 2017-01-22 MED ORDER — SODIUM CHLORIDE 0.9 % IV BOLUS (SEPSIS)
1000.0000 mL | Freq: Once | INTRAVENOUS | Status: AC
  Administered 2017-01-22: 1000 mL via INTRAVENOUS

## 2017-01-22 MED ORDER — METHYLPREDNISOLONE SODIUM SUCC 125 MG IJ SOLR
60.0000 mg | Freq: Four times a day (QID) | INTRAMUSCULAR | Status: DC
  Administered 2017-01-22 – 2017-01-23 (×3): 60 mg via INTRAVENOUS
  Filled 2017-01-22 (×3): qty 2

## 2017-01-22 MED ORDER — ACETAMINOPHEN 325 MG PO TABS
650.0000 mg | ORAL_TABLET | Freq: Four times a day (QID) | ORAL | Status: DC | PRN
  Administered 2017-01-24 – 2017-01-28 (×5): 650 mg via ORAL
  Filled 2017-01-22 (×6): qty 2

## 2017-01-22 MED ORDER — CEFTRIAXONE SODIUM IN DEXTROSE 20 MG/ML IV SOLN
1.0000 g | Freq: Once | INTRAVENOUS | Status: DC
  Filled 2017-01-22: qty 50

## 2017-01-22 MED ORDER — ENOXAPARIN SODIUM 40 MG/0.4ML ~~LOC~~ SOLN
40.0000 mg | SUBCUTANEOUS | Status: DC
  Administered 2017-01-22 – 2017-01-28 (×7): 40 mg via SUBCUTANEOUS
  Filled 2017-01-22 (×7): qty 0.4

## 2017-01-22 MED ORDER — IOPAMIDOL (ISOVUE-370) INJECTION 76%
75.0000 mL | Freq: Once | INTRAVENOUS | Status: AC | PRN
  Administered 2017-01-22: 75 mL via INTRAVENOUS

## 2017-01-22 MED ORDER — ALPRAZOLAM 1 MG PO TABS
1.0000 mg | ORAL_TABLET | Freq: Three times a day (TID) | ORAL | Status: DC | PRN
  Administered 2017-01-22 – 2017-01-29 (×20): 1 mg via ORAL
  Filled 2017-01-22 (×21): qty 1

## 2017-01-22 MED ORDER — AZITHROMYCIN 250 MG PO TABS
250.0000 mg | ORAL_TABLET | Freq: Every day | ORAL | Status: DC
  Administered 2017-01-23: 250 mg via ORAL
  Filled 2017-01-22: qty 1

## 2017-01-22 MED ORDER — BUDESONIDE 0.5 MG/2ML IN SUSP
0.5000 mg | Freq: Two times a day (BID) | RESPIRATORY_TRACT | Status: DC
  Administered 2017-01-23 – 2017-01-29 (×12): 0.5 mg via RESPIRATORY_TRACT
  Filled 2017-01-22 (×12): qty 2

## 2017-01-22 MED ORDER — VITAMIN D 1000 UNITS PO TABS
2000.0000 [IU] | ORAL_TABLET | Freq: Every day | ORAL | Status: DC
  Administered 2017-01-23 – 2017-01-29 (×7): 2000 [IU] via ORAL
  Filled 2017-01-22 (×7): qty 2

## 2017-01-22 MED ORDER — GABAPENTIN 300 MG PO CAPS
300.0000 mg | ORAL_CAPSULE | Freq: Two times a day (BID) | ORAL | Status: DC
  Administered 2017-01-22 – 2017-01-29 (×14): 300 mg via ORAL
  Filled 2017-01-22 (×14): qty 1

## 2017-01-22 MED ORDER — ROPINIROLE HCL 0.25 MG PO TABS
0.2500 mg | ORAL_TABLET | Freq: Two times a day (BID) | ORAL | Status: DC
  Administered 2017-01-22 – 2017-01-29 (×14): 0.25 mg via ORAL
  Filled 2017-01-22 (×14): qty 1

## 2017-01-22 MED ORDER — FUROSEMIDE 20 MG PO TABS
20.0000 mg | ORAL_TABLET | Freq: Every day | ORAL | Status: DC
  Administered 2017-01-23 – 2017-01-29 (×6): 20 mg via ORAL
  Filled 2017-01-22 (×7): qty 1

## 2017-01-22 MED ORDER — DEXTROSE 5 % IV SOLN
1.0000 g | INTRAVENOUS | Status: DC
  Filled 2017-01-22: qty 10

## 2017-01-22 MED ORDER — IPRATROPIUM-ALBUTEROL 0.5-2.5 (3) MG/3ML IN SOLN
3.0000 mL | RESPIRATORY_TRACT | Status: DC | PRN
  Administered 2017-01-23: 3 mL via RESPIRATORY_TRACT
  Filled 2017-01-22 (×2): qty 3

## 2017-01-22 MED ORDER — ESCITALOPRAM OXALATE 10 MG PO TABS
20.0000 mg | ORAL_TABLET | Freq: Every day | ORAL | Status: DC
  Administered 2017-01-22 – 2017-01-28 (×7): 20 mg via ORAL
  Filled 2017-01-22 (×7): qty 2

## 2017-01-22 MED ORDER — DILTIAZEM HCL 30 MG PO TABS
30.0000 mg | ORAL_TABLET | Freq: Three times a day (TID) | ORAL | Status: DC | PRN
  Filled 2017-01-22: qty 1

## 2017-01-22 NOTE — ED Triage Notes (Signed)
Patient arrives to desk pale, speaking one word sentences, SAT 71 on his own 02 2l. Roomed immediately with SAT increase to 95 on 6l. Wet cough noted. States has been sick with respiratory and GI symtoms, today resp distress escalated.

## 2017-01-22 NOTE — ED Notes (Signed)
Admitting MD at bedside.

## 2017-01-22 NOTE — ED Provider Notes (Signed)
[facility] Emergency Department Provider Note  ____________________________________________   First MD Initiated Contact with Patient 01/22/17 1610     (approximate)  I have reviewed the triage vital signs and the nursing notes.   HISTORY  Chief Complaint Shortness of Breath    [name] is a 61 y.o. male who comes to the emergency department with 1 day of worsening shortness of breath. He has a past medical history of COPD and has had a productive cough for the past several months. He has had 2 courses of azithromycin, 2 courses of Levaquin, a 21 day course of doxycycline, and a course of Bactrim all without relief.He denies fevers or chills. He denies ever having taken steroids the last several months. She does have sharp moderate severity aching chest pain worse when coughing improved with rest. He denies abdominal pain nausea or vomiting.   Past Medical History:  Diagnosis Date  . Allergy   . Anxiety   . Anxiety   . Asthma   . Chronic diastolic CHF (congestive heart failure) (HCC)    a. echo 07/2013: EF 60-65%, DD, biatrial dilatation, Ao sclerosis, dilated RV, moderate pulmonary HTN, elevated CV and RA pressures; b. patient reported echo at Dr. Milta Deiters office 02/2015 - his office does not have record of him being a pt there c. echo 11/2015: EF 60-65%, Grade 1 DD, mod-severe pulm pressures  . Chronic respiratory failure (HCC)    a. on 2L via nasal cannula; b. secondary to COPD  . COPD (chronic obstructive pulmonary disease) (HCC)   . Depression   . Depression   . Depression   . Emphysema of lung (HCC)   . GERD (gastroesophageal reflux disease)   . Hypertension   . Personal history of tobacco use, presenting hazards to health 08/17/2015  . Tobacco abuse     Patient Active Problem List   Diagnosis Date Noted  . Anxiety 06/22/2016  . HTN (hypertension) 06/22/2016  . Acute respiratory failure with hypoxia (HCC) 03/25/2016  . Tobacco  use disorder 03/25/2016  . Acute on chronic respiratory failure with hypercapnia (HCC)   . Endotracheally intubated   . Chest pain with low risk of acute coronary syndrome 11/30/2015  . Chronic diastolic heart failure (HCC) 11/30/2015  . Sinus tachycardia seen on cardiac monitor 11/30/2015  . Hypercapnic respiratory failure (HCC) 11/30/2015  . Pulmonary hypertension (HCC)   . Centrilobular emphysema (HCC)   . Lethargy   . Personal history of tobacco use, presenting hazards to health 08/17/2015  . Pressure ulcer 04/19/2015  . Acute on chronic diastolic CHF (congestive heart failure) (HCC)   . COPD exacerbation (HCC) 03/31/2015    Past Surgical History:  Procedure Laterality Date  . ABDOMINAL SURGERY    . ADENOIDECTOMY    . hemorrh    . NOSE SURGERY    . URETHRA SURGERY     surgery 6 times from age [age] yrs old    Prior to Admission medications   Medication Sig Start Date End Date Taking? Authorizing Provider  Albuterol Sulfate (PROAIR RESPICLICK) 108 (90 BASE) MCG/ACT AEPB Inhale 2 puffs into the lungs every 4 (four) hours as needed (for shortness of breath). Reported on 12/10/2015   Yes [provider]  ALPRAZolam Prudy Feeler) 1 MG tablet Take 1 tablet (1 mg total) by mouth 3 (three) times daily as needed for anxiety. 12/06/15  Yes Enedina Finner, MD  Cholecalciferol (VITAMIN D) 2000 units CAPS Take 2,000 Units by mouth daily.  Yes [provider]  diltiazem (CARDIZEM) 30 MG tablet Take 1 tablet (30 mg total) by mouth 3 (three) times daily as needed. 08/01/16  Yes Gollan, Tollie Pizza, MD  escitalopram (LEXAPRO) 20 MG tablet Take 20 mg by mouth at bedtime.    Yes [provider]  furosemide (LASIX) 20 MG tablet Take 20 mg by mouth daily.    Yes [provider]  gabapentin (NEURONTIN) 600 MG tablet Take 300 mg by mouth 2 (two) times daily.    Yes [provider]  ipratropium-albuterol (DUONEB) 0.5-2.5 (3) MG/3ML SOLN Take 3 mLs by nebulization 4  (four) times daily as needed (for shortness of breath).   Yes [provider]  mometasone-formoterol (DULERA) 200-5 MCG/ACT AERO Inhale 1 puff into the lungs 2 (two) times daily.    Yes [provider]  potassium chloride (K-DUR,KLOR-CON) 10 MEQ tablet Take 10 mEq by mouth once.   Yes [provider]  Probiotic Product (PROBIOTIC-10) CAPS Take 10 capsules by mouth daily.   Yes [provider]  rOPINIRole (REQUIP) 0.25 MG tablet Take 1 tablet by mouth 2 (two) times daily. 01/04/17  Yes [provider]  theophylline (UNIPHYL) 400 MG 24 hr tablet Take 400 mg by mouth 2 (two) times daily.   Yes [provider]  Tiotropium Bromide Monohydrate (SPIRIVA RESPIMAT) 2.5 MCG/ACT AERS Inhale 2 puffs into the lungs daily.    Yes [provider]  predniSONE (STERAPRED UNI-PAK 21 TAB) 10 MG (21) TBPK tablet 40mg  oral 1 day, then 20mg  oral for 2 days, then 10mg  oral 2 days, then stop Patient not taking: Reported on 01/22/2017 06/27/16   Hower, Cletis [place], MD    Allergies Asa [aspirin]; Bee venom; Codeine; and Ibuprofen  Family History  Problem Relation Age of Onset  . CAD Father   . Hypertension Mother   . Peripheral Artery Disease Mother   . Diabetes Neg Hx     Social History Social History  Substance Use Topics  . Smoking status: Current Every Day Smoker    Packs/day: 0.10    Years: 43.00    Types: E-cigarettes  . Smokeless tobacco: Never Used  . Alcohol use No    Review of Systems Constitutional: No fever/chills Eyes: No visual changes. ENT: No sore throat. Cardiovascular: Positive chest pain. Respiratory: Positive shortness of breath. Gastrointestinal: No abdominal pain.  No nausea, no vomiting.  No diarrhea.  No constipation. Genitourinary: Negative for dysuria. Musculoskeletal: Negative for back pain. Skin: Negative for rash. Neurological: Negative for headaches, focal weakness or numbness.  10-point ROS otherwise  negative.  ____________________________________________   PHYSICAL EXAM:  VITAL SIGNS: ED Triage Vitals  Enc Vitals Group     BP 01/22/17 1603 130/88     Pulse Rate 01/22/17 1603 (!) 110     Resp 01/22/17 1603 (!) 28     Temp 01/22/17 1603 98.1 F (36.7 C)     Temp Source 01/22/17 1603 Oral     SpO2 01/22/17 1603 (!) 71 %     Weight 01/22/17 1606 215 lb (97.5 kg)     Height 01/22/17 1606 5\' 10"  (1.778 m)     Head Circumference --      Peak Flow --      Pain Score 01/22/17 1602 0     Pain Loc --      Pain Edu? --      Excl. in GC? --     Constitutional: Alert and oriented x 4 Speaks in  short sentences and appears clearly short of breath Eyes: PERRL EOMI. Head: Atraumatic. Nose: No congestion/rhinnorhea. Mouth/Throat: No trismus Neck: No stridor.   Cardiovascular: Tachycardic rate, regular rhythm. Grossly normal heart sounds.  Good peripheral circulation. Respiratory: Increased respiratory effort with coarse lung sounds throughout and moving limited amounts of air Gastrointestinal: Soft nontender Musculoskeletal: No lower extremity edema   Neurologic:  Normal speech and language. No gross focal neurologic deficits are appreciated. Skin:  Skin is warm, dry and intact. No rash noted. Psychiatric: Mood and affect are normal. Speech and behavior are normal.    ____________________________________________   DIFFERENTIAL  Pneumonia, pulmonary embolism, malignancy, pneumothorax, hemothorax, lobar collapse ____________________________________________   LABS (all labs ordered are listed, but only abnormal results are displayed)  Labs Reviewed  COMPREHENSIVE METABOLIC PANEL - Abnormal; Notable for the following:       Result Value   Chloride 97 (*)    CO2 33 (*)    Calcium 8.8 (*)    All other components within normal limits  CBC WITH DIFFERENTIAL/PLATELET - Abnormal; Notable for the following:    RBC 4.14 (*)    HCT 39.8 (*)    All other components within normal  limits  BLOOD GAS, VENOUS - Abnormal; Notable for the following:    pCO2, Ven 79 (*)    Bicarbonate 38.0 (*)    Acid-Base Excess 8.3 (*)    All other components within normal limits  BRAIN NATRIURETIC PEPTIDE - Abnormal; Notable for the following:    B Natriuretic Peptide 200.0 (*)    All other components within normal limits  BASIC METABOLIC PANEL - Abnormal; Notable for the following:    Potassium 5.4 (*)    CO2 36 (*)    Glucose, Bld 143 (*)    Calcium 8.8 (*)    Anion gap 3 (*)    All other components within normal limits  CBC - Abnormal; Notable for the following:    RBC 4.11 (*)    All other components within normal limits  POTASSIUM - Abnormal; Notable for the following:    Potassium 5.2 (*)    All other components within normal limits  CULTURE, EXPECTORATED SPUTUM-ASSESSMENT  CULTURE, RESPIRATORY (NON-EXPECTORATED)  CULTURE, BLOOD (ROUTINE X 2)  CULTURE, BLOOD (ROUTINE X 2)  TROPONIN I  LACTIC ACID, PLASMA  PROCALCITONIN  PROCALCITONIN    VBG with low pH and elevated CO2 consistent with acute respiratory acidemia __________________________________________  EKG  ED ECG REPORT I, Merrily BrittleNeil Marquon Alcala, the attending physician, personally viewed and interpreted this ECG.  Date: 01/23/2017 Rate: 106 Rhythm: Rightward sinus rhythm QRS Axis: normal Intervals: Prolonged QTC ST/T Wave abnormalities: normal Conduction Disturbances: Bifascicular block Narrative Interpretation: Abnormal  ____________________________________________  RADIOLOGY  CT scan of the chest concerning for multilobar pneumonia ____________________________________________   PROCEDURES  Procedure(s) performed: no  Procedures  Critical Care performed: yes  CRITICAL CARE Performed by: Merrily BrittleNeil Raygan Skarda   Total critical care time: 35 minutes  Critical care time was exclusive of separately billable procedures and treating other patients.  Critical care was necessary to treat or prevent  imminent or life-threatening deterioration.  Critical care was time spent personally by me on the following activities: development of treatment plan with patient and/or surrogate as well as nursing, discussions with consultants, evaluation of patient's response to treatment, examination of patient, obtaining history from patient or surrogate, ordering and performing treatments and interventions, ordering and review of laboratory studies, ordering and review of radiographic studies, pulse oximetry and re-evaluation of patient's  condition.   Observation: no ____________________________________________   INITIAL IMPRESSION / ASSESSMENT AND PLAN / ED COURSE  Pertinent labs & imaging results that were available during my care of the patient were reviewed by me and considered in my medical decision making (see chart for details).  The patient arrives short of breath and hypoxic to the 70s on room air perking up to 89% on 6 L of nasal cannula. He has coarse breath sounds throughout and is coughing up productive cough. He is completed multiple courses of antibiotics and I'm concerned that he has recurrent pneumonia. Given the multiple courses however there may be a structural component so in addition to a chest x-ray he will require a CT scan.  CT scan is concerning for multilobar pneumonia. He has not been hospitalized recently and while he has been treated with Levaquin and azithromycin he has not had a third generation cephalosporin so I think it is reasonable to treat him with ceftriaxone and azithromycin at this point pending results of the sputum culture.      ____________________________________________   FINAL CLINICAL IMPRESSION(S) / ED DIAGNOSES  Final diagnoses:  Community acquired pneumonia of right lung, unspecified part of lung      NEW MEDICATIONS STARTED DURING THIS VISIT:  Current Discharge Medication List       Note:  This document was prepared using Dragon voice  recognition software and may include unintentional dictation errors.     Merrily Brittle, MD 01/23/17 1527

## 2017-01-22 NOTE — H&P (Signed)
[facility] - [place] [facility]   PATIENT NAME: Dustin [name]    MR#:  [id number]  DATE OF BIRTH:  [birth year]  DATE OF ADMISSION:  01/22/2017  PRIMARY CARE PHYSICIAN: Lyndon Code, MD   REQUESTING/REFERRING PHYSICIAN: Dr. Lamont Snowball  CHIEF COMPLAINT:   Chief Complaint  Patient presents with  . Shortness of Breath    HISTORY OF PRESENT ILLNESS:  Dustin [name]  is a 61 y.o. male with a known history of severe COPD, chronic respiratory failure, hypertension, depression, anxiety who presents to the hospital due to cough and shortness of breath. Patient says that he's had a persistent cough which is nonproductive with yellow-green sputum for over a month. He has seen his primary care physician and has been treated with oral Levaquin, Bactrim, erythromycin, doxycycline without much improvement in his symptoms. He has been having significant shortness of breath even on minimal exertion and therefore came to the ER for further evaluation. Patient was noted to be severely hypoxic with O2 sats in the low 70s and noted to be in COPD exacerbation with CT chest findings suggestive of multifocal pneumonia. Hospitalist services were contacted for further treatment and evaluation.  PAST MEDICAL HISTORY:   Past Medical History:  Diagnosis Date  . Allergy   . Anxiety   . Anxiety   . Asthma   . Chronic diastolic CHF (congestive heart failure) (HCC)    a. echo 07/2013: EF 60-65%, DD, biatrial dilatation, Ao sclerosis, dilated RV, moderate pulmonary HTN, elevated CV and RA pressures; b. patient reported echo at Dr. Milta Deiters office 02/2015 - his office does not have record of him being a pt there c. echo 11/2015: EF 60-65%, Grade 1 DD, mod-severe pulm pressures  . Chronic respiratory failure (HCC)    a. on 2L via nasal cannula; b. secondary to COPD  . COPD (chronic obstructive pulmonary disease) (HCC)   . Depression   . Depression   . Depression   . Emphysema of lung (HCC)   . GERD  (gastroesophageal reflux disease)   . Hypertension   . Personal history of tobacco use, presenting hazards to health 08/17/2015  . Tobacco abuse     PAST SURGICAL HISTORY:   Past Surgical History:  Procedure Laterality Date  . ABDOMINAL SURGERY    . ADENOIDECTOMY    . hemorrh    . NOSE SURGERY    . URETHRA SURGERY     surgery 6 times from age [age] yrs old    SOCIAL HISTORY:   Social History  Substance Use Topics  . Smoking status: Current Every Day Smoker    Packs/day: 0.10    Years: 43.00    Types: E-cigarettes  . Smokeless tobacco: Never Used  . Alcohol use No    FAMILY HISTORY:   Family History  Problem Relation Age of Onset  . CAD Father   . Hypertension Mother   . Peripheral Artery Disease Mother   . Diabetes Neg Hx     DRUG ALLERGIES:   Allergies  Allergen Reactions  . Asa [Aspirin] Other (See Comments)    Reaction: swelling of the right side.  . Bee Venom Swelling  . Codeine Other (See Comments)    Reaction: Difficulty breathing  . Ibuprofen Other (See Comments)    Reaction: Swelling of the right side.    REVIEW OF SYSTEMS:   Review of Systems  Constitutional: Negative for fever and weight loss.  HENT: Negative for congestion, nosebleeds and tinnitus.   Eyes: Negative  for blurred vision, double vision and redness.  Respiratory: Positive for cough, sputum production, shortness of breath and wheezing. Negative for hemoptysis.   Cardiovascular: Negative for chest pain, orthopnea, leg swelling and PND.  Gastrointestinal: Negative for abdominal pain, diarrhea, melena, nausea and vomiting.  Genitourinary: Negative for dysuria, hematuria and urgency.  Musculoskeletal: Negative for falls and joint pain.  Neurological: Negative for dizziness, tingling, sensory change, focal weakness, seizures, weakness and headaches.  Endo/Heme/Allergies: Negative for polydipsia. Does not bruise/bleed easily.  Psychiatric/Behavioral: Negative for depression and memory  loss. The patient is not nervous/anxious.     MEDICATIONS AT HOME:   Prior to Admission medications   Medication Sig Start Date End Date Taking? Authorizing Provider  Albuterol Sulfate (PROAIR RESPICLICK) 108 (90 BASE) MCG/ACT AEPB Inhale 2 puffs into the lungs every 4 (four) hours as needed (for shortness of breath). Reported on 12/10/2015   Yes [provider]  ALPRAZolam Prudy Feeler) 1 MG tablet Take 1 tablet (1 mg total) by mouth 3 (three) times daily as needed for anxiety. 12/06/15  Yes Enedina Finner, MD  Cholecalciferol (VITAMIN D) 2000 units CAPS Take 2,000 Units by mouth daily.   Yes [provider]  diltiazem (CARDIZEM) 30 MG tablet Take 1 tablet (30 mg total) by mouth 3 (three) times daily as needed. 08/01/16  Yes Gollan, Tollie Pizza, MD  escitalopram (LEXAPRO) 20 MG tablet Take 20 mg by mouth at bedtime.    Yes [provider]  furosemide (LASIX) 20 MG tablet Take 20 mg by mouth daily.    Yes [provider]  gabapentin (NEURONTIN) 600 MG tablet Take 300 mg by mouth 2 (two) times daily.    Yes [provider]  ipratropium-albuterol (DUONEB) 0.5-2.5 (3) MG/3ML SOLN Take 3 mLs by nebulization 4 (four) times daily as needed (for shortness of breath).   Yes [provider]  mometasone-formoterol (DULERA) 200-5 MCG/ACT AERO Inhale 1 puff into the lungs 2 (two) times daily.    Yes [provider]  potassium chloride (K-DUR,KLOR-CON) 10 MEQ tablet Take 10 mEq by mouth once.   Yes [provider]  Probiotic Product (PROBIOTIC-10) CAPS Take 10 capsules by mouth daily.   Yes [provider]  rOPINIRole (REQUIP) 0.25 MG tablet Take 1 tablet by mouth 2 (two) times daily. 01/04/17  Yes [provider]  theophylline (UNIPHYL) 400 MG 24 hr tablet Take 400 mg by mouth 2 (two) times daily.   Yes [provider]  Tiotropium Bromide Monohydrate (SPIRIVA RESPIMAT) 2.5 MCG/ACT AERS Inhale 2 puffs into the lungs daily.     Yes [provider]  predniSONE (STERAPRED UNI-PAK 21 TAB) 10 MG (21) TBPK tablet 40mg  oral 1 day, then 20mg  oral for 2 days, then 10mg  oral 2 days, then stop Patient not taking: Reported on 01/22/2017 06/27/16   Hower, Cletis Athens, MD      VITAL SIGNS:  Blood pressure 129/81, pulse (!) 108, temperature 98.1 F (36.7 C), temperature source Oral, resp. rate 19, height 5\' 10"  (1.778 m), weight 97.5 kg (215 lb), SpO2 93 %.  PHYSICAL EXAMINATION:  Physical Exam  GENERAL:  61 y.o.-year-old patient lying in bed in mild Resp. Distress.  EYES: Pupils equal, round, reactive to light and accommodation. No scleral icterus. Extraocular muscles intact.  HEENT: Head atraumatic, normocephalic. Oropharynx and nasopharynx clear. No oropharyngeal erythema, moist oral mucosa  NECK:  Supple, no jugular venous distention. No thyroid enlargement, no tenderness.  LUNGS: prolonged inspiratory and expiratory phase, diffuse wheezing b/l, No  rales, b/l rhonchi. No use of accessory muscles of respiration.  CARDIOVASCULAR: S1, S2 RRR. No murmurs, rubs, gallops, clicks.  ABDOMEN: Soft, nontender, nondistended. Bowel sounds present. No organomegaly or mass.  EXTREMITIES: No pedal edema, cyanosis, or clubbing. + 2 pedal & radial pulses b/l.   NEUROLOGIC: Cranial nerves II through XII are intact. No focal Motor or sensory deficits appreciated b/l PSYCHIATRIC: The patient is alert and oriented x 3.  SKIN: No obvious rash, lesion, or ulcer.   LABORATORY PANEL:   CBC  Recent Labs Lab 01/22/17 1614  WBC 7.9  HGB 13.4  HCT 39.8*  PLT 235   ------------------------------------------------------------------------------------------------------------------  Chemistries   Recent Labs Lab 01/22/17 1614  NA 139  K 3.5  CL 97*  CO2 33*  GLUCOSE 97  BUN 12  CREATININE 1.07  CALCIUM 8.8*  AST 22  ALT 26  ALKPHOS 89  BILITOT 0.6    ------------------------------------------------------------------------------------------------------------------  Cardiac Enzymes  Recent Labs Lab 01/22/17 1614  TROPONINI <0.03   ------------------------------------------------------------------------------------------------------------------  RADIOLOGY:  Dg Chest 2 View  Result Date: 01/22/2017 CLINICAL DATA:  Shortness of breath and cough.  History of COPD. EXAM: CHEST  2 VIEW COMPARISON:  Chest CT 12/03/2016 FINDINGS: Hyperinflation with advanced emphysema. Nodular right upper lobe opacity is faintly visualized, this was present on prior chest CT. Bibasilar atelectasis. Heart is at the upper limits of normal in size. Mild hilar prominence likely related to overlapping vascular structures. Right middle lobe atelectasis on prior CT is not definitively visualized. No pulmonary edema, pleural fluid or pneumothorax. IMPRESSION: 1. Emphysema. 2. Right upper lobe nodular opacity persists. Recommend CT follow-up. 3. Bibasilar atelectasis. Electronically Signed   By: Rubye Oaks M.D.   On: 01/22/2017 17:27   Ct Angio Chest Pe W/cm &/or Wo Cm  Result Date: 01/22/2017 CLINICAL DATA:  Decreased O2 sats. EXAM: CT ANGIOGRAPHY CHEST WITH CONTRAST TECHNIQUE: Multidetector CT imaging of the chest was performed using the standard protocol during bolus administration of intravenous contrast. Multiplanar CT image reconstructions and MIPs were obtained to evaluate the vascular anatomy. CONTRAST:  75 cc Isovue 370 COMPARISON:  11/23/2016 FINDINGS: Cardiovascular: No evidence for filling defects in the opacified pulmonary arteries to suggest the presence of an acute pulmonary embolus. Although study is non gated, no thoracic aortic dissection is identified. Heart size normal. Trace anterior pericardial thickening or fluid. Mediastinum/Nodes: Borderline mediastinal lymphadenopathy is stable with 9 mm short axis right paratracheal lymph node , 10 mm short axis  subcarinal lymph node, and borderline lymph nodes in each hilum. The esophagus has normal imaging features. There is no axillary lymphadenopathy. Lungs/Pleura: Advanced changes of emphysema are again evident. 2.7 x 1.0 cm nodule in the right upper lobe is stable when comparing to the study from 2 months ago. A linear component was measured on the previous study. A more globular component on today's exam measures 15 x 12 mm which compares to 20 x 10 mm previously. Right middle lobe collapse seen on the previous study has decreased in the interval. There is new bilateral lower lobe patchy airspace disease with nodularity. Given that this is only in the lower lobes, aspiration, bronchopneumonia common atypical infection would be considerations. Upper Abdomen: Multiple surgical clips noted around the proximal stomach. Musculoskeletal: Bone windows reveal no worrisome lytic or sclerotic osseous lesions. Review of the MIP images confirms the above findings. IMPRESSION: 1. Interval development of multiple bibasilar pulmonary nodules on a background of patchy bilateral lower lobe airspace disease. Given the interval appearance over  2 months, this is probably aspiration and/or infection. Given the advanced emphysema in the upper lobes, it is possible this could represent rapid advancement of metastatic disease in the relatively spared lower lobes, but this is felt to be less likely. Nevertheless, close follow-up is recommended. 2. The irregular nodular density in the right upper lobe shows no substantial interval change. Continued close attention on follow-up recommended as neoplasm could have this appearance. 3. No CT evidence for acute pulmonary embolus. 4. Stable borderline mediastinal and hilar lymphadenopathy. Electronically Signed   By: Kennith CenterEric  Mansell M.D.   On: 01/22/2017 17:57     IMPRESSION AND PLAN:   61 year old male with past medical history of COPD, chronic respiratory failure, anxiety, depression, restless  leg syndrome, neuropathy who presents to the hospital due to shortness of breath and noted to be in COPD exacerbation.  1. Acute on chronic respiratory failure with hypoxia-secondary to COPD exacerbation. Continue O2 supplementation, treatment of underlying COPD with IV steroids, scheduled DuoNeb's, Pulmicort nebs and empiric antibiotics.  2. COPD exacerbation-secondary to multifocal pneumonia as noted on the CT of the chest. We'll treat with IV steroids, scheduled DuoNeb's, Pulmicort nebs. Patient has been treated as an outpatient with oral Levaquin, doxycycline, erythromycin, Bactrim without much improvement now we'll start the patient on IV ceftriaxone, Zithromax. We'll get a pulmonary consult. Pt. Is well known to Dr. Carolynne EdouardSadaat Khan Continue theophylline  3. Anxiety/depression-continue Xanax, Lexapro.  4. Neuropathy-continue gabapentin.  5. Restless leg syndrome-continue Requip.    All the records are reviewed and case discussed with ED provider. Management plans discussed with the patient, family and they are in agreement.  CODE STATUS: full  TOTAL TIME TAKING CARE OF THIS PATIENT: 40 minutes.    [name],VIVEK J M.D on 01/22/2017 at 7:13 PM  Between 7am to 6pm - Pager - [phone]  After 6pm go to [email or web address] - password [email or web address] [facility]  [facility]  Office  [phone]  CC: Primary care physician; Lyndon CodeKhan, Fozia M, MD

## 2017-01-22 NOTE — ED Notes (Signed)
Report to Stanton Kidneyebra, Charity fundraiserN. No questions at this time.

## 2017-01-23 LAB — BASIC METABOLIC PANEL
ANION GAP: 3 — AB (ref 5–15)
BUN: 10 mg/dL (ref 6–20)
CALCIUM: 8.8 mg/dL — AB (ref 8.9–10.3)
CO2: 36 mmol/L — ABNORMAL HIGH (ref 22–32)
CREATININE: 0.89 mg/dL (ref 0.61–1.24)
Chloride: 104 mmol/L (ref 101–111)
GFR calc non Af Amer: >60 mL/min (ref >60)
Glucose, Bld: 143 mg/dL — ABNORMAL HIGH (ref 65–99)
Potassium: 5.4 mmol/L — ABNORMAL HIGH (ref 3.5–5.1)
Sodium: 143 mmol/L (ref 135–145)

## 2017-01-23 LAB — PROCALCITONIN: Procalcitonin: 0.11 ng/mL

## 2017-01-23 LAB — CBC
HCT: 40.9 % (ref 40.0–52.0)
Hemoglobin: 13.3 g/dL (ref 13.0–18.0)
MCH: 32.3 pg (ref 26.0–34.0)
MCHC: 32.4 g/dL (ref 32.0–36.0)
MCV: 99.4 fL (ref 80.0–100.0)
PLATELETS: 219 10*3/uL (ref 150–440)
RBC: 4.11 MIL/uL — AB (ref 4.40–5.90)
RDW: 13.4 % (ref 11.5–14.5)
WBC: 8 10*3/uL (ref 3.8–10.6)

## 2017-01-23 LAB — POTASSIUM: Potassium: 5.2 mmol/L — ABNORMAL HIGH (ref 3.5–5.1)

## 2017-01-23 MED ORDER — GUAIFENESIN ER 600 MG PO TB12
600.0000 mg | ORAL_TABLET | Freq: Two times a day (BID) | ORAL | Status: DC
  Administered 2017-01-23 – 2017-01-29 (×13): 600 mg via ORAL
  Filled 2017-01-23 (×13): qty 1

## 2017-01-23 MED ORDER — DM-GUAIFENESIN ER 30-600 MG PO TB12: 1.0000 | ORAL_TABLET | Freq: Two times a day (BID) | ORAL | Status: DC

## 2017-01-23 MED ORDER — IPRATROPIUM-ALBUTEROL 0.5-2.5 (3) MG/3ML IN SOLN
3.0000 mL | Freq: Four times a day (QID) | RESPIRATORY_TRACT | Status: DC
  Administered 2017-01-23 – 2017-01-29 (×23): 3 mL via RESPIRATORY_TRACT
  Filled 2017-01-23 (×23): qty 3

## 2017-01-23 MED ORDER — NICOTINE 14 MG/24HR TD PT24
14.0000 mg | MEDICATED_PATCH | Freq: Every day | TRANSDERMAL | Status: DC
  Administered 2017-01-23 – 2017-01-29 (×7): 14 mg via TRANSDERMAL
  Filled 2017-01-23 (×7): qty 1

## 2017-01-23 MED ORDER — GUAIFENESIN 100 MG/5ML PO SOLN
5.0000 mL | ORAL | Status: DC | PRN
  Administered 2017-01-28: 100 mg via ORAL
  Filled 2017-01-23: qty 5

## 2017-01-23 MED ORDER — DEXTROMETHORPHAN POLISTIREX ER 30 MG/5ML PO SUER
30.0000 mg | Freq: Two times a day (BID) | ORAL | Status: DC
  Administered 2017-01-23 – 2017-01-29 (×12): 30 mg via ORAL
  Filled 2017-01-23 (×16): qty 5

## 2017-01-23 MED ORDER — METHYLPREDNISOLONE SODIUM SUCC 40 MG IJ SOLR
40.0000 mg | Freq: Four times a day (QID) | INTRAMUSCULAR | Status: DC
  Administered 2017-01-23 – 2017-01-26 (×12): 40 mg via INTRAVENOUS
  Filled 2017-01-23 (×13): qty 1

## 2017-01-23 MED ORDER — ORAL CARE MOUTH RINSE
15.0000 mL | Freq: Two times a day (BID) | OROMUCOSAL | Status: DC
  Administered 2017-01-24 – 2017-01-28 (×3): 15 mL via OROMUCOSAL

## 2017-01-23 NOTE — Evaluation (Signed)
Physical Therapy Evaluation Patient Details Name: Dustin [name] MRN: [id number] DOB: 09-Oct-1955 Today's Date: 01/23/2017   History of Present Illness   61 y.o. male with a known history of severe COPD, chronic respiratory failure, hypertension, depression, anxiety who presents to the hospital due to cough and shortness of breath. Patient says that he's had a persistent cough which is nonproductive with yellow-green sputum.  He arrived to ED with O2 in the low 70s - continues to have labored breathing with low O2 sats.  Clinical Impression  Pt did relatively well with mobility, ambulation, strength and balance but his O2 was a considerable issue.  On 6 liters at rest his sats were in the mid 80s and even with focused purposeful breathing he did not get higher than 88%, dropped to 81% with modest activity (50 ft ambulation w/o AD and minimal seated exercises).      Follow Up Recommendations Home health PT    Equipment Recommendations       Recommendations for Other Services       Precautions / Restrictions Precautions Precautions:  (mod fall risk) Restrictions Weight Bearing Restrictions: No      Mobility  Bed Mobility Overal bed mobility: Independent             General bed mobility comments: Pt able to easily get to sitting EOB.  Transfers Overall transfer level: Independent Equipment used: None             General transfer comment: Pt able to rise with good safety and confidence  Ambulation/Gait Ambulation/Gait assistance: Modified independent (Device/Increase time) Ambulation Distance (Feet): 50 Feet Assistive device: None       General Gait Details: Pt with slow but steady and safe ambulation, however his O2 was only in the high 80s (on 6 liters) when we started and dropped as low as 81% with minimal exertion - HR remained in in the 110s t/o the session as well  Stairs            Wheelchair Mobility    Modified Rankin (Stroke Patients Only)        Balance Overall balance assessment: Independent                                           Pertinent Vitals/Pain Pain Assessment: No/denies pain    Home Living Family/patient expects to be discharged to:: Private residence Living Arrangements: Children Available Help at Discharge: Family Type of Home: House Home Access: Stairs to enter Entrance Stairs-Rails: Can reach both Entrance Stairs-Number of Steps: 3  Home Layout: One level Home Equipment: Walker - 2 wheels;Shower seat;Grab bars - tub/shower Additional Comments: Pt lives with his son and daughter in [place]. Son is a Naval architect and is gone during the day.    Prior Function Level of Independence: Independent               Hand Dominance        Extremity/Trunk Assessment   Upper Extremity Assessment Upper Extremity Assessment: Overall WFL for tasks assessed    Lower Extremity Assessment Lower Extremity Assessment: Overall WFL for tasks assessed       Communication   Communication: No difficulties  Cognition Arousal/Alertness: Awake/alert Behavior During Therapy: WFL for tasks assessed/performed Overall Cognitive Status: Within Functional Limits for tasks assessed  General Comments      Exercises     Assessment/Plan    PT Assessment Patient needs continued PT services  PT Problem List Decreased activity tolerance;Cardiopulmonary status limiting activity;Decreased safety awareness       PT Treatment Interventions Functional mobility training;Therapeutic activities;Therapeutic exercise;Gait training;Stair training;Balance training    PT Goals (Current goals can be found in the Care Plan section)  Acute Rehab PT Goals Patient Stated Goal: get breathing better PT Goal Formulation: With patient Time For Goal Achievement: 02/06/17 Potential to Achieve Goals: Good    Frequency Min 2X/week   Barriers to discharge         Co-evaluation               AM-PAC PT "6 Clicks" Daily Activity  Outcome Measure Difficulty turning over in bed (including adjusting bedclothes, sheets and blankets)?: None Difficulty moving from lying on back to sitting on the side of the bed? : None Difficulty sitting down on and standing up from a chair with arms (e.g., wheelchair, bedside commode, etc,.)?: None Help needed moving to and from a bed to chair (including a wheelchair)?: None Help needed walking in hospital room?: None Help needed climbing 3-5 steps with a railing? : None 6 Click Score: 24    End of Session Equipment Utilized During Treatment: Gait belt;Oxygen (6 liters) Activity Tolerance: Patient limited by fatigue Patient left: in chair;with call bell/phone within reach Nurse Communication: Other (comment);Mobility status (O2 drop with mobility) PT Visit Diagnosis: Difficulty in walking, not elsewhere classified (R26.2)    Time: 1450-1510 PT Time Calculation (min) (ACUTE ONLY): 20 min   Charges:   PT Evaluation $PT Eval Low Complexity: 1 Procedure     PT G Codes:        Malachi ProGalen R Reid Regas, DPT 01/23/2017, 4:12 PM

## 2017-01-23 NOTE — Care Management (Signed)
RNCM consult received for patient with medication needs. [facility] with patient and [facility]  He makes $1324 per month income and lives with his son that works. Patient is on home O2 through [facility] at 2L continuously. RNCM encouraged patient to speak with his PCP and other physicians to help him with medication alternatives and generic medications. Patient verbalized understanding.

## 2017-01-23 NOTE — Progress Notes (Addendum)
[employer] - [place] at [facility]   PATIENT NAME: Dustin [name]    MR#:  [id number]  DATE OF BIRTH:  1956-08-15  SUBJECTIVE:  CHIEF COMPLAINT:   Chief Complaint  Patient presents with  . Shortness of Breath   Still cough, wheezing and shortness of breath. On oxygen by nasal cannular 5 L. REVIEW OF SYSTEMS:  Review of Systems  Constitutional: Positive for malaise/fatigue. Negative for chills and fever.  HENT: Negative for congestion and sore throat.   Eyes: Negative for blurred vision and double vision.  Respiratory: Positive for cough, sputum production, shortness of breath and wheezing. Negative for hemoptysis and stridor.   Cardiovascular: Negative for chest pain, orthopnea and leg swelling.  Gastrointestinal: Negative for abdominal pain, blood in stool, constipation, diarrhea, melena, nausea and vomiting.  Genitourinary: Negative for dysuria and hematuria.  Musculoskeletal: Negative for back pain.  Skin: Negative for itching and rash.  Neurological: Positive for weakness. Negative for dizziness, focal weakness and loss of consciousness.  Psychiatric/Behavioral: Negative for depression. The patient is not nervous/anxious.     DRUG ALLERGIES:   Allergies  Allergen Reactions  . Asa [Aspirin] Other (See Comments)    Reaction: swelling of the right side.  . Bee Venom Swelling  . Codeine Other (See Comments)    Reaction: Difficulty breathing  . Ibuprofen Other (See Comments)    Reaction: Swelling of the right side.   VITALS:  Blood pressure 112/71, pulse 100, temperature 97.4 F (36.3 C), temperature source Oral, resp. rate 20, height 5\' 11"  (1.803 m), weight 217 lb 11.2 oz (98.7 kg), SpO2 90 %. PHYSICAL EXAMINATION:  Physical Exam  Constitutional: He is oriented to person, place, and time and well-developed, well-nourished, and in no distress.  Obese.  HENT:  Head: Normocephalic.  Mouth/Throat: Oropharynx is clear and moist.  Eyes: Conjunctivae  and EOM are normal. No scleral icterus.  Neck: Normal range of motion. Neck supple. No JVD present. No tracheal deviation present.  Cardiovascular: Normal rate, regular rhythm and normal heart sounds.  Exam reveals no gallop.   No murmur heard. Pulmonary/Chest: Effort normal. No respiratory distress. He has wheezes. He has no rales. He exhibits no tenderness.  Abdominal: Soft. Bowel sounds are normal. He exhibits no distension. There is no tenderness.  Musculoskeletal: Normal range of motion. He exhibits no edema or tenderness.  Neurological: He is alert and oriented to person, place, and time. No cranial nerve deficit.  Skin: No rash noted. No erythema.  Psychiatric: Affect and judgment normal.   LABORATORY PANEL:  Male CBC  Recent Labs Lab 01/23/17 0547  WBC 8.0  HGB 13.3  HCT 40.9  PLT 219   ------------------------------------------------------------------------------------------------------------------ Chemistries   Recent Labs Lab 01/22/17 1614 01/23/17 0547 01/23/17 1202  NA 139 143  --   K 3.5 5.4* 5.2*  CL 97* 104  --   CO2 33* 36*  --   GLUCOSE 97 143*  --   BUN 12 10  --   CREATININE 1.07 0.89  --   CALCIUM 8.8* 8.8*  --   AST 22  --   --   ALT 26  --   --   ALKPHOS 89  --   --   BILITOT 0.6  --   --    RADIOLOGY:  Dg Chest 2 View  Result Date: 01/22/2017 CLINICAL DATA:  Shortness of breath and cough.  History of COPD. EXAM: CHEST  2 VIEW COMPARISON:  Chest CT 12/03/2016 FINDINGS: Hyperinflation with  advanced emphysema. Nodular right upper lobe opacity is faintly visualized, this was present on prior chest CT. Bibasilar atelectasis. Heart is at the upper limits of normal in size. Mild hilar prominence likely related to overlapping vascular structures. Right middle lobe atelectasis on prior CT is not definitively visualized. No pulmonary edema, pleural fluid or pneumothorax. IMPRESSION: 1. Emphysema. 2. Right upper lobe nodular opacity persists. Recommend CT  follow-up. 3. Bibasilar atelectasis. Electronically Signed   By: Rubye OaksMelanie  Ehinger M.D.   On: 01/22/2017 17:27   Ct Angio Chest Pe W/cm &/or Wo Cm  Result Date: 01/22/2017 CLINICAL DATA:  Decreased O2 sats. EXAM: CT ANGIOGRAPHY CHEST WITH CONTRAST TECHNIQUE: Multidetector CT imaging of the chest was performed using the standard protocol during bolus administration of intravenous contrast. Multiplanar CT image reconstructions and MIPs were obtained to evaluate the vascular anatomy. CONTRAST:  75 cc Isovue 370 COMPARISON:  11/23/2016 FINDINGS: Cardiovascular: No evidence for filling defects in the opacified pulmonary arteries to suggest the presence of an acute pulmonary embolus. Although study is non gated, no thoracic aortic dissection is identified. Heart size normal. Trace anterior pericardial thickening or fluid. Mediastinum/Nodes: Borderline mediastinal lymphadenopathy is stable with 9 mm short axis right paratracheal lymph node , 10 mm short axis subcarinal lymph node, and borderline lymph nodes in each hilum. The esophagus has normal imaging features. There is no axillary lymphadenopathy. Lungs/Pleura: Advanced changes of emphysema are again evident. 2.7 x 1.0 cm nodule in the right upper lobe is stable when comparing to the study from 2 months ago. A linear component was measured on the previous study. A more globular component on today's exam measures 15 x 12 mm which compares to 20 x 10 mm previously. Right middle lobe collapse seen on the previous study has decreased in the interval. There is new bilateral lower lobe patchy airspace disease with nodularity. Given that this is only in the lower lobes, aspiration, bronchopneumonia common atypical infection would be considerations. Upper Abdomen: Multiple surgical clips noted around the proximal stomach. Musculoskeletal: Bone windows reveal no worrisome lytic or sclerotic osseous lesions. Review of the MIP images confirms the above findings. IMPRESSION: 1.  Interval development of multiple bibasilar pulmonary nodules on a background of patchy bilateral lower lobe airspace disease. Given the interval appearance over 2 months, this is probably aspiration and/or infection. Given the advanced emphysema in the upper lobes, it is possible this could represent rapid advancement of metastatic disease in the relatively spared lower lobes, but this is felt to be less likely. Nevertheless, close follow-up is recommended. 2. The irregular nodular density in the right upper lobe shows no substantial interval change. Continued close attention on follow-up recommended as neoplasm could have this appearance. 3. No CT evidence for acute pulmonary embolus. 4. Stable borderline mediastinal and hilar lymphadenopathy. Electronically Signed   By: Kennith CenterEric  Mansell M.D.   On: 01/22/2017 17:57   ASSESSMENT AND PLAN:   61 year old male with past medical history of COPD, chronic respiratory failure, anxiety, depression, restless leg syndrome, neuropathy who presents to the hospital due to shortness of breath and noted to be in COPD exacerbation.  1. Acute on chronic respiratory failure with hypoxia-secondary to COPD exacerbation. Continue O2 supplementation, IV steroids, scheduled DuoNeb's, Pulmicort nebs and empiric antibiotics.  2. COPD exacerbation-secondary to multifocal pneumonia as noted on the CT of the chest. continue IV steroids, scheduled DuoNeb's, Pulmicort nebs. Patient has been treated as an outpatient with oral Levaquin, doxycycline, erythromycin, Bactrim without much improvement. pro calcitonin is 0.11,  possible viral PNA, consider dicontinue IV ceftriaxone, Zithromax. f/u Dr. Carolynne Edouard Continue theophylline  3. Anxiety/depression-continue Xanax, Lexapro.  4. Neuropathy-continue gabapentin.  5. Restless leg syndrome-continue Requip.  Hyperkalemia.the patient was given potassium supplement last night. Potassium was 5.4 this morning, repeat 5.2. Follow-up  potassium level tomorrow.  Tobacco abuse. Smoking cessation was counseled for 4 minutes. Nicotine patch.  All the records are reviewed and case discussed with Care Management/Social Worker. Management plans discussed with the patient, family and they are in agreement.  CODE STATUS: Full Code  TOTAL TIME TAKING CARE OF THIS PATIENT: 41 minutes.   More than 50% of the time was spent in counseling/coordination of care: YES  POSSIBLE D/C IN 3 DAYS, DEPENDING ON CLINICAL CONDITION.   Shaune Pollack M.D on 01/23/2017 at 4:38 PM  Between 7am to 6pm - Pager - [phone]  After 6pm go to [email or web address] - password [email or web address] [facility]  [facility] [place] [facility]  Office  [phone]  CC: Primary care physician; Lyndon Code, MD  Note: This dictation was prepared with Dragon dictation along with smaller phrase technology. Any transcriptional errors that result from this process are unintentional.

## 2017-01-24 LAB — POTASSIUM: Potassium: 4.4 mmol/L (ref 3.5–5.1)

## 2017-01-24 LAB — MRSA PCR SCREENING: MRSA BY PCR: NEGATIVE

## 2017-01-24 LAB — PROCALCITONIN: Procalcitonin: 0.11 ng/mL

## 2017-01-24 NOTE — Care Management Note (Addendum)
Case Management Note  Patient Details  Name: Dustin [name] MRN: [id number] Date of Birth: 1956-01-22  Subjective/ObjectiveHershal [name]:   PT recommending home PT. Spoke with patient and [facility] He prefers to use [facility] again.    Action/Plan: [name]  Expected Discharge Date:                  Expected Discharge Plan:  Home w Home Health Services  In-House Referral:     Discharge planning Services  CM Consult  Post Acute Care Choice:  Home Health Choice offered to:  Patient  DME Arranged:    DME Agency:     HH Arranged:  PT [name]  Status of Service:  In process, will continue to follow  If discussed at Long Length of Stay Meetings, dates discussed:    Additional Comments:  Marily MemosLisa M Emmitte Surgeon, RN 01/24/2017, 1:58 PM

## 2017-01-24 NOTE — Progress Notes (Signed)
Patients sister called asking if we can switch medications to dexamethasone. Advised will pass along to day shift for them to discuss with MD.

## 2017-01-24 NOTE — Progress Notes (Signed)
Physical Therapy Treatment Patient Details Name: Dustin [name] MRN: [id number] DOB: [birth year] Today's Date: 01/24/2017    History of Present Illness  61 y.o. male with a known history of severe COPD, chronic respiratory failure, hypertension, depression, anxiety who presents to the hospital due to cough and shortness of breath. Patient says that he's had a persistent cough which is nonproductive with yellow-green sputum.  He arrived to ED with O2 in the low 70s - continues to have labored breathing with low O2 sats.    PT Comments    Pt is making limited progress towards goals secondary to decreased O2 sats with exertion. Pt on 4L of O2 at rest this date, however unable to achieve sats WNL for mobility. Increased to 6L for mobility with decreased sats noted, limiting further ambulation. Slight unsteadiness noted this date. Recommend trial with SPC vs RW for further mobility as pt reaching out for furniture during ambulation. Returned to [employer] manager notified. Will continue to progress.   Follow Up Recommendations  Home health PT ([facility] referral?)     Equipment Recommendations       Recommendations for Other Services       Precautions / Restrictions Precautions Precautions:  (mod fall risk) Restrictions Weight Bearing Restrictions: No    Mobility  Bed Mobility Overal bed mobility: Independent             General bed mobility comments: Pt able to easily get to sitting EOB. Prior to mobility pt on 4L of O2 with sats at 83% at rest.  Transfers Overall transfer level: Independent Equipment used: None             General transfer comment: Pt able to rise with good safety and confidence  Ambulation/Gait Ambulation/Gait assistance: Modified independent (Device/Increase time) Ambulation Distance (Feet): 40 Feet Assistive device: None       General Gait Details: slow ambulation reaching out for furniture with B hands. 1 standing rest break noted secondary to  SOB symptoms. Increased O2 to 6L of O2 during exertion. O2 sats decreased to 78%; HR at 110s to 120s. Unable to further ambulate secondary to decreased sats.    Stairs            Wheelchair Mobility    Modified Rankin (Stroke Patients Only)       Balance                                            Cognition Arousal/Alertness: Awake/alert Behavior During Therapy: Anxious Overall Cognitive Status: Within Functional Limits for tasks assessed                                        Exercises      General Comments General comments (skin integrity, edema, etc.): Once pt in recliner, O2 sats returned to 83% while on 4L of O2. RN notified.      Pertinent Vitals/Pain Pain Assessment: No/denies pain    Home Living                      Prior Function            PT Goals (current goals can now be found in the care plan section) Acute Rehab PT Goals Patient Stated Goal:  get breathing better PT Goal Formulation: With patient Time For Goal Achievement: 02/06/17 Potential to Achieve Goals: Good Progress towards PT goals: Progressing toward goals    Frequency    Min 2X/week      PT Plan Current plan remains appropriate    Co-evaluation              AM-PAC PT "6 Clicks" Daily Activity  Outcome Measure  Difficulty turning over in bed (including adjusting bedclothes, sheets and blankets)?: None Difficulty moving from lying on back to sitting on the side of the bed? : None Difficulty sitting down on and standing up from a chair with arms (e.g., wheelchair, bedside commode, etc,.)?: None Help needed moving to and from a bed to chair (including a wheelchair)?: None Help needed walking in hospital room?: A Little Help needed climbing 3-5 steps with a railing? : A Little 6 Click Score: 22    End of Session Equipment Utilized During Treatment: Gait belt;Oxygen Activity Tolerance: Patient limited by fatigue Patient  left: in chair;with call bell/phone within reach Nurse Communication: Mobility status PT Visit Diagnosis: Difficulty in walking, not elsewhere classified (R26.2)     Time: [id number] PT Time Calculation (min) (ACUTE ONLY): 17 min  Charges:  $Gait Training: 8-22 mins                    G Codes:       Elizabeth PalauStephanie Halim Surrette, PT, DPT [phone]    Maeley Matton 01/24/2017, 11:26 AM

## 2017-01-24 NOTE — Plan of Care (Signed)
Problem: Activity: Goal: Ability to tolerate increased activity will improve Outcome: Not Progressing Patient oxygen dropped significantly during ambulation with PT session

## 2017-01-24 NOTE — Progress Notes (Addendum)
[employer] - [place] at [facility]   PATIENT NAME: Dustin [name]    MR#:  [id number]  DATE OF BIRTH:  02-Feb-1956  SUBJECTIVE:  CHIEF COMPLAINT:   Chief Complaint  Patient presents with  . Shortness of Breath   Still cough, wheezing and shortness of breath. On oxygen by nasal cannular 5 L. Anxious. Given xanax. SAT down to 70-80's this am, up to 6 L. REVIEW OF SYSTEMS:  Review of Systems  Constitutional: Positive for malaise/fatigue. Negative for chills and fever.  HENT: Negative for congestion and sore throat.   Eyes: Negative for blurred vision and double vision.  Respiratory: Positive for cough, sputum production, shortness of breath and wheezing. Negative for hemoptysis and stridor.   Cardiovascular: Negative for chest pain, orthopnea and leg swelling.  Gastrointestinal: Negative for abdominal pain, blood in stool, constipation, diarrhea, melena, nausea and vomiting.  Genitourinary: Negative for dysuria and hematuria.  Musculoskeletal: Negative for back pain.  Skin: Negative for itching and rash.  Neurological: Positive for weakness. Negative for dizziness, focal weakness and loss of consciousness.  Psychiatric/Behavioral: Negative for depression. The patient is nervous/anxious.     DRUG ALLERGIES:   Allergies  Allergen Reactions  . Asa [Aspirin] Other (See Comments)    Reaction: swelling of the right side.  . Bee Venom Swelling  . Codeine Other (See Comments)    Reaction: Difficulty breathing  . Ibuprofen Other (See Comments)    Reaction: Swelling of the right side.   VITALS:  Blood pressure 110/62, pulse 100, temperature 98.2 F (36.8 C), temperature source Oral, resp. rate (!) 22, height 5\' 11"  (1.803 m), weight 217 lb 11.2 oz (98.7 kg), SpO2 (!) 89 %. PHYSICAL EXAMINATION:  Physical Exam  Constitutional: He is oriented to person, place, and time and well-developed, well-nourished, and in no distress.  Obese.  HENT:  Head: Normocephalic.    Mouth/Throat: Oropharynx is clear and moist.  Eyes: Conjunctivae and EOM are normal. No scleral icterus.  Neck: Normal range of motion. Neck supple. No JVD present. No tracheal deviation present.  Cardiovascular: Normal rate, regular rhythm and normal heart sounds.  Exam reveals no gallop.   No murmur heard. Pulmonary/Chest: Effort normal. No respiratory distress. He has wheezes. He has no rales. He exhibits no tenderness.  Abdominal: Soft. Bowel sounds are normal. He exhibits no distension. There is no tenderness.  Musculoskeletal: Normal range of motion. He exhibits no edema or tenderness.  Neurological: He is alert and oriented to person, place, and time. No cranial nerve deficit.  Skin: No rash noted. No erythema.  Psychiatric: Affect and judgment normal.   LABORATORY PANEL:  Male CBC  Recent Labs Lab 01/23/17 0547  WBC 8.0  HGB 13.3  HCT 40.9  PLT 219   ------------------------------------------------------------------------------------------------------------------ Chemistries   Recent Labs Lab 01/22/17 1614 01/23/17 0547  01/24/17 0430  NA 139 143  --   --   K 3.5 5.4*  < > 4.4  CL 97* 104  --   --   CO2 33* 36*  --   --   GLUCOSE 97 143*  --   --   BUN 12 10  --   --   CREATININE 1.07 0.89  --   --   CALCIUM 8.8* 8.8*  --   --   AST 22  --   --   --   ALT 26  --   --   --   ALKPHOS 89  --   --   --  BILITOT 0.6  --   --   --   < > = values in this interval not displayed. RADIOLOGY:  No results found. ASSESSMENT AND PLAN:   61 year old male with past medical history of COPD, chronic respiratory failure, anxiety, depression, restless leg syndrome, neuropathy who presents to the hospital due to shortness of breath and noted to be in COPD exacerbation.  1. Acute on chronic respiratory failure with hypoxia-secondary to COPD exacerbation. Continue O2 supplementation, IV steroids, scheduled DuoNeb's, Pulmicort nebs and empiric antibiotics.  2. COPD  exacerbation-secondary to multifocal pneumonia as noted on the CT of the chest. continue IV steroids, scheduled DuoNeb's, Pulmicort nebs. Patient has been treated as an outpatient with oral Levaquin, doxycycline, erythromycin, Bactrim without much improvement. pro calcitonin is 0.11, possible viral PNA,  dicontinue IV ceftriaxone, Zithromax. f/u Dr. Carolynne Edouard Continue theophylline  3. Anxiety/depression-continue Xanax, Lexapro.  4. Neuropathy-continue gabapentin.  5. Restless leg syndrome-continue Requip.  Hyperkalemia. Improved.  Tobacco abuse. Smoking cessation was counseled for 4 minutes. Nicotine patch. PT: HHPT. All the records are reviewed and case discussed with Care Management/Social Worker. Management plans discussed with the patient, family and they are in agreement.  CODE STATUS: Full Code  TOTAL TIME TAKING CARE OF THIS PATIENT: 39 minutes.   More than 50% of the time was spent in counseling/coordination of care: YES  POSSIBLE D/C IN 3 DAYS, DEPENDING ON CLINICAL CONDITION.   Shaune Pollack M.D on 01/24/2017 at 1:33 PM  Between 7am to 6pm - Pager - [phone]  After 6pm go to [email or web address] - password [email or web address] [facility]  [facility] [place] [facility]  Office  [phone]  CC: Primary care physician; Lyndon Code, MD  Note: This dictation was prepared with Dragon dictation along with smaller phrase technology. Any transcriptional errors that result from this process are unintentional.

## 2017-01-25 LAB — CULTURE, RESPIRATORY

## 2017-01-25 LAB — CULTURE, RESPIRATORY W GRAM STAIN: Special Requests: NORMAL

## 2017-01-25 MED ORDER — VANCOMYCIN HCL 10 G IV SOLR
1500.0000 mg | Freq: Two times a day (BID) | INTRAVENOUS | Status: DC
  Administered 2017-01-26 – 2017-01-28 (×5): 1500 mg via INTRAVENOUS
  Filled 2017-01-25 (×7): qty 1500

## 2017-01-25 MED ORDER — PIPERACILLIN-TAZOBACTAM 3.375 G IVPB
3.3750 g | Freq: Three times a day (TID) | INTRAVENOUS | Status: DC
  Administered 2017-01-25 – 2017-01-29 (×12): 3.375 g via INTRAVENOUS
  Filled 2017-01-25 (×17): qty 50

## 2017-01-25 MED ORDER — VANCOMYCIN HCL IN DEXTROSE 1-5 GM/200ML-% IV SOLN
1000.0000 mg | Freq: Once | INTRAVENOUS | Status: AC
  Administered 2017-01-25: 1000 mg via INTRAVENOUS
  Filled 2017-01-25: qty 200

## 2017-01-25 NOTE — Progress Notes (Signed)
Pharmacy Antibiotic Note  Dustin CoriaDennis R [name] is a 61 y.o. male admitted on 01/22/2017 with pneumonia.  Pharmacy has been consulted for vancomycin and piperacillin/tazo dosing. Spoke with Dr. Imogene Burnhen about negative MRSA PCR but pulmonologist would like to give vancomycin. Pt has increased sputum production and color has changed. Pt received vancomycin 1000 mg IV x 1.   Plan: Begin piperacillin/tazobactam 3.375 IV q 8 hours  Begin vancomycin 1500 mg IV q 12 hours (6 hour stacked dosing). Trough to be drawn prior to 4th dose as this should represent steady state with a goal of 15-20. Cmin at ss estimated to be 16.5. Did not dose q8 hours due to age and weight (accumulation) Pk: t1/2=7.7  Vd=60  Ke=0.9  Height: 5\' 11"  (180.3 cm) Weight: 217 lb 11.2 oz (98.7 kg) IBW/kg (Calculated) : 75.3  Temp (24hrs), Avg:98.3 F (36.8 C), Min:98.1 F (36.7 C), Max:98.4 F (36.9 C)   Recent Labs Lab 01/22/17 1614 01/22/17 1834 01/23/17 0547  WBC 7.9  --  8.0  CREATININE 1.07  --  0.89  LATICACIDVEN  --  0.9  --     Estimated Creatinine Clearance: 103.1 mL/min (by C-G formula based on SCr of 0.89 mg/dL).    Allergies  Allergen Reactions  . Asa [Aspirin] Other (See Comments)    Reaction: swelling of the right side.  . Bee Venom Swelling  . Codeine Other (See Comments)    Reaction: Difficulty breathing  . Ibuprofen Other (See Comments)    Reaction: Swelling of the right side.    Antimicrobials this admission: Azithromycin and ceftriaxone 5/7 >> 5/8 Piperacillin/tazo 5/10 >>  Vancomycin 5/10 >>  Dose adjustments this admission:  Microbiology results: 5/7 BCx: NG x 3 days 5/7 Sputum: normal flora  5/9 MRSA PCR: negative  Thank you for allowing pharmacy to be a part of this patient's care.  Horris LatinoHolly Inocencio Roy, PharmD Pharmacy Resident 01/25/2017 7:14 PM

## 2017-01-25 NOTE — Consult Note (Signed)
Pulmonary Critical Care  Initial Consult Note   [name]:[id number] DOB: Feb 14, 1956 DOA: 01/22/2017  Referring physician: Dr Imogene Burn PCP: Lyndon Code, MD   Chief Complaint: Pneumonia  HPI: Dustin [name] is a 61 y.o. male with history of COPD noncompliance tobacco use presented to the ED with increased SOB cough and congestion. He notes that he has been having increased sputum production and color has changed. Patient has history of bronchiectasis in addition. He has been treated with abx but has failed oral therapy. He does have recurring admissions to the hospital for pneumonia. Patient was noted to be hypoxic on admission with SaO2 in the 70s. Patient had a CT scan suggesting aspiration and multifocal pneumonia   Review of Systems:  Constitutional:  No weight loss, night sweats, + fatigue.  HEENT:  No headaches, nasal congestion, post nasal drip,  Cardio-vascular:  No chest pain, anasarca, dizziness, palpitations  GI:  No heartburn, indigestion, abdominal pain, nausea, vomiting, diarrhea  Resp:  +shortness of breath. +productive cough, No coughing up of blood. +wheezing Skin:  no rash or lesions.  Musculoskeletal:  No joint pain or swelling.   Remainder ROS performed and is unremarkable other than noted in HPI  Past Medical History:  Diagnosis Date  . Allergy   . Anxiety   . Anxiety   . Asthma   . Chronic diastolic CHF (congestive heart failure) (HCC)    a. echo 07/2013: EF 60-65%, DD, biatrial dilatation, Ao sclerosis, dilated RV, moderate pulmonary HTN, elevated CV and RA pressures; b. patient reported echo at Dr. Milta Deiters office 02/2015 - his office does not have record of him being a pt there c. echo 11/2015: EF 60-65%, Grade 1 DD, mod-severe pulm pressures  . Chronic respiratory failure (HCC)    a. on 2L via nasal cannula; b. secondary to COPD  . COPD (chronic obstructive pulmonary disease) (HCC)   . Depression   . Depression   . Depression   . Emphysema of  lung (HCC)   . GERD (gastroesophageal reflux disease)   . Hypertension   . Personal history of tobacco use, presenting hazards to health 08/17/2015  . Tobacco abuse    Past Surgical History:  Procedure Laterality Date  . ABDOMINAL SURGERY    . ADENOIDECTOMY    . hemorrh    . NOSE SURGERY    . URETHRA SURGERY     surgery 6 times from age [age] yrs old   Social History:  reports that he has been smoking E-cigarettes.  He has a 4.30 pack-year smoking history. He has never used smokeless tobacco. He reports that he does not drink alcohol or use drugs.  Allergies  Allergen Reactions  . Asa [Aspirin] Other (See Comments)    Reaction: swelling of the right side.  . Bee Venom Swelling  . Codeine Other (See Comments)    Reaction: Difficulty breathing  . Ibuprofen Other (See Comments)    Reaction: Swelling of the right side.    Family History  Problem Relation Age of Onset  . CAD Father   . Hypertension Mother   . Peripheral Artery Disease Mother   . Diabetes Neg Hx     Prior to Admission medications   Medication Sig Start Date End Date Taking? Authorizing Provider  Albuterol Sulfate (PROAIR RESPICLICK) 108 (90 BASE) MCG/ACT AEPB Inhale 2 puffs into the lungs every 4 (four) hours as needed (for shortness of breath). Reported on 12/10/2015   Yes [provider]  ALPRAZolam (  XANAX) 1 MG tablet Take 1 tablet (1 mg total) by mouth 3 (three) times daily as needed for anxiety. 12/06/15  Yes Enedina FinnerPatel, Sona, MD  Cholecalciferol (VITAMIN D) 2000 units CAPS Take 2,000 Units by mouth daily.   Yes [provider]  diltiazem (CARDIZEM) 30 MG tablet Take 1 tablet (30 mg total) by mouth 3 (three) times daily as needed. 08/01/16  Yes Gollan, Tollie Pizzaimothy J, MD  escitalopram (LEXAPRO) 20 MG tablet Take 20 mg by mouth at bedtime.    Yes [provider]  furosemide (LASIX) 20 MG tablet Take 20 mg by mouth daily.    Yes [provider]  gabapentin (NEURONTIN) 600 MG tablet Take  300 mg by mouth 2 (two) times daily.    Yes [provider]  ipratropium-albuterol (DUONEB) 0.5-2.5 (3) MG/3ML SOLN Take 3 mLs by nebulization 4 (four) times daily as needed (for shortness of breath).   Yes [provider]  mometasone-formoterol (DULERA) 200-5 MCG/ACT AERO Inhale 1 puff into the lungs 2 (two) times daily.    Yes [provider]  potassium chloride (K-DUR,KLOR-CON) 10 MEQ tablet Take 10 mEq by mouth once.   Yes [provider]  Probiotic Product (PROBIOTIC-10) CAPS Take 10 capsules by mouth daily.   Yes [provider]  rOPINIRole (REQUIP) 0.25 MG tablet Take 1 tablet by mouth 2 (two) times daily. 01/04/17  Yes [provider]  theophylline (UNIPHYL) 400 MG 24 hr tablet Take 400 mg by mouth 2 (two) times daily.   Yes [provider]  Tiotropium Bromide Monohydrate (SPIRIVA RESPIMAT) 2.5 MCG/ACT AERS Inhale 2 puffs into the lungs daily.    Yes [provider]  predniSONE (STERAPRED UNI-PAK 21 TAB) 10 MG (21) TBPK tablet 40mg  oral 1 day, then 20mg  oral for 2 days, then 10mg  oral 2 days, then stop Patient not taking: Reported on 01/22/2017 06/27/16   Hower, Cletis Athensavid K, MD   Physical Exam: Vitals:   01/24/17 1935 01/24/17 2300 01/25/17 0205 01/25/17 0741  BP:  115/60  (!) 110/54  Pulse:  89  (!) 111  Resp:  18    Temp:  98.4 F (36.9 C)  98.1 F (36.7 C)  TempSrc:  Oral  Oral  SpO2: 91% 92% 91% 91%  Weight:      Height:        Wt Readings from Last 3 Encounters:  01/22/17 98.7 kg (217 lb 11.2 oz)  08/01/16 99 kg (218 lb 4 oz)  06/28/16 96.6 kg (213 lb)    General:  Appears calm and comfortable Eyes: PERRL, normal lids, irises & conjunctiva ENT: grossly normal hearing, lips & tongue Neck: no LAD, masses or thyromegaly Cardiovascular: RRR, no m/r/g. No LE edema. Respiratory: coarse rhonchi Normal respiratory effort. Abdomen: soft, nontender Skin: no rash or induration seen on limited  exam Musculoskeletal: grossly normal tone BUE/BLE Psychiatric: grossly normal mood and affect Neurologic: grossly non-focal.          Labs on Admission:  Basic Metabolic Panel:  Recent Labs Lab 01/22/17 1614 01/23/17 0547 01/23/17 1202 01/24/17 0430  NA 139 143  --   --   K 3.5 5.4* 5.2* 4.4  CL 97* 104  --   --   CO2 33* 36*  --   --   GLUCOSE 97 143*  --   --   BUN 12 10  --   --   CREATININE 1.07 0.89  --   --   CALCIUM 8.8* 8.8*  --   --  Liver Function Tests:  Recent Labs Lab 01/22/17 1614  AST 22  ALT 26  ALKPHOS 89  BILITOT 0.6  PROT 7.2  ALBUMIN 3.6   No results for input(s): LIPASE, AMYLASE in the last 168 hours. No results for input(s): AMMONIA in the last 168 hours. CBC:  Recent Labs Lab 01/22/17 1614 01/23/17 0547  WBC 7.9 8.0  NEUTROABS 5.5  --   HGB 13.4 13.3  HCT 39.8* 40.9  MCV 95.9 99.4  PLT 235 219   Cardiac Enzymes:  Recent Labs Lab 01/22/17 1614  TROPONINI <0.03    BNP (last 3 results)  Recent Labs  03/25/16 1120 06/22/16 1557 01/22/17 1614  BNP 160.0* 369.0* 200.0*    ProBNP (last 3 results) No results for input(s): PROBNP in the last 8760 hours.  CBG: No results for input(s): GLUCAP in the last 168 hours.  Radiological Exams on Admission: No results found.     EKG: Independently reviewed.  Assessment/Plan Active Problems:   COPD exacerbation (HCC)   1. Pneumonia Likely HCAP vs Aspiration -patient has some progression of infiltrates at the bases of R>L lower lobes -though procalcitonin is not elevated this does not entirely rule out a local bacterial process -clinically he still favors a pneumonic process with increasing infiltrates and also history of MRSA -in addition he has significant bronchiectasis with purulant sputum -would start abx as discussed with hospitalist -if there is still no improvement would consider bronch for cultures only if his SaO2 improves  2. COPD -right now would continue  with IV solumedrol -will continue with nebs inhalers -encourage compliance with medical therapy     Code Status: full code DVT Prophylaxis:and prphylaxis Family Communication: none Disposition Plan: home  Time spent:    I have personally obtained a history, examined the patient, evaluated laboratory and imaging results, formulated the assessment and plan and placed orders.  The Patient requires high complexity decision making for assessment and support.    Yevonne Pax, MD [facility] Pulmonary Critical Care Medicine Sleep Medicine

## 2017-01-25 NOTE — Care Management Note (Signed)
Case Management Note  Patient Details  Name: Dustin CoriaDennis R [name] MRN: [id number] Date of Birth: 1956/01/01  Subjective/Objective:    Nursing added to home health plan                Action/Plan:   Expected Discharge Date:                  Expected Discharge Plan:  Home w Home Health Services  In-House Referral:     Discharge planning Services  CM Consult  Post Acute Care Choice:  Home Health Choice offered to:  Patient  DME Arranged:    DME Agency:     HH Arranged:  PT, RN [name]  Status of Service:  In process, will continue to follow  If discussed at Long Length of Stay Meetings, dates discussed:    Additional Comments:  Marily MemosLisa M Davidson Palmieri, RN 01/25/2017, 3:34 PM

## 2017-01-25 NOTE — Progress Notes (Addendum)
[facility] [place] [facility]   PATIENT NAME: Arley PhenixDennis Bugh    MR#:  [id number]  DATE OF BIRTH:  01/29/1956  SUBJECTIVE:  CHIEF COMPLAINT:   Chief Complaint  Patient presents with  . Shortness of Breath   better cough, wheezing and shortness of breath. On oxygen by nasal cannular 5 L. Anxious. Given xanax.  REVIEW OF SYSTEMS:  Review of Systems  Constitutional: Positive for malaise/fatigue. Negative for chills and fever.  HENT: Negative for congestion and sore throat.   Eyes: Negative for blurred vision and double vision.  Respiratory: Positive for cough, sputum production and shortness of breath. Negative for hemoptysis, wheezing and stridor.   Cardiovascular: Negative for chest pain, orthopnea and leg swelling.  Gastrointestinal: Negative for abdominal pain, blood in stool, constipation, diarrhea, melena, nausea and vomiting.  Genitourinary: Negative for dysuria and hematuria.  Musculoskeletal: Negative for back pain.  Skin: Negative for itching and rash.  Neurological: Positive for weakness. Negative for dizziness, focal weakness and loss of consciousness.  Psychiatric/Behavioral: Negative for depression. The patient is nervous/anxious.     DRUG ALLERGIES:   Allergies  Allergen Reactions  . Asa [Aspirin] Other (See Comments)    Reaction: swelling of the right side.  . Bee Venom Swelling  . Codeine Other (See Comments)    Reaction: Difficulty breathing  . Ibuprofen Other (See Comments)    Reaction: Swelling of the right side.   VITALS:  Blood pressure (!) 110/54, pulse (!) 111, temperature 98.1 F (36.7 C), temperature source Oral, resp. rate 18, height 5\' 11"  (1.803 m), weight 217 lb 11.2 oz (98.7 kg), SpO2 91 %. PHYSICAL EXAMINATION:  Physical Exam  Constitutional: He is oriented to person, place, and time and well-developed, well-nourished, and in no distress.  Obese.  HENT:  Head: Normocephalic.  Mouth/Throat: Oropharynx is clear and  moist.  Eyes: Conjunctivae and EOM are normal. No scleral icterus.  Neck: Normal range of motion. Neck supple. No JVD present. No tracheal deviation present.  Cardiovascular: Normal rate, regular rhythm and normal heart sounds.  Exam reveals no gallop.   No murmur heard. Pulmonary/Chest: Effort normal. No respiratory distress. He has wheezes. He has no rales. He exhibits no tenderness.  Abdominal: Soft. Bowel sounds are normal. He exhibits no distension. There is no tenderness.  Musculoskeletal: Normal range of motion. He exhibits no edema or tenderness.  Neurological: He is alert and oriented to person, place, and time. No cranial nerve deficit.  Skin: No rash noted. No erythema.  Psychiatric: Affect and judgment normal.   LABORATORY PANEL:  Male CBC  Recent Labs Lab 01/23/17 0547  WBC 8.0  HGB 13.3  HCT 40.9  PLT 219   ------------------------------------------------------------------------------------------------------------------ Chemistries   Recent Labs Lab 01/22/17 1614 01/23/17 0547  01/24/17 0430  NA 139 143  --   --   K 3.5 5.4*  < > 4.4  CL 97* 104  --   --   CO2 33* 36*  --   --   GLUCOSE 97 143*  --   --   BUN 12 10  --   --   CREATININE 1.07 0.89  --   --   CALCIUM 8.8* 8.8*  --   --   AST 22  --   --   --   ALT 26  --   --   --   ALKPHOS 89  --   --   --   BILITOT 0.6  --   --   --   < > =  values in this interval not displayed. RADIOLOGY:  No results found. ASSESSMENT AND PLAN:   61 year old male with past medical history of COPD, chronic respiratory failure, anxiety, depression, restless leg syndrome, neuropathy who presents to the hospital due to shortness of breath and noted to be in COPD exacerbation.  1. Acute on chronic respiratory failure with hypoxia-secondary to COPD exacerbation. Try to wean down O2 [place] I, IV steroids, scheduled DuoNeb's, Pulmicort nebs and antibiotics.  2. COPD exacerbation-secondary to HAP or aspiration PNA (multifocal  pneumonia as noted on the CT of the chest). continue IV steroids, scheduled DuoNeb's, Pulmicort nebs. Continue theophylline Patient has been treated as an outpatient with oral Levaquin, doxycycline, erythromycin, Bactrim without much improvement. pro calcitonin is 0.11, dicontinued IV ceftriaxone, Zithromax. The patient the possible has HAP and aspiration pneumonia, started vancomycin and Zosyn per Dr. Carolynne Edouard.   3. Anxiety/depression-continue Xanax, Lexapro.  4. Neuropathy-continue gabapentin.  5. Restless leg syndrome-continue Requip.  Hyperkalemia. Improved.  Tobacco abuse. Smoking cessation was counseled for 4 minutes. Nicotine patch. PT: HHPT.  I discussed with Dr. Carolynne Edouard. All the records are reviewed and case discussed with Care Management/Social Worker. Management plans discussed with the patient, family and they are in agreement.  CODE STATUS: Full Code  TOTAL TIME TAKING CARE OF THIS PATIENT: 37 minutes.   More than 50% of the time was spent in counseling/coordination of care: YES  POSSIBLE D/C IN 3 DAYS, DEPENDING ON CLINICAL CONDITION.   Shaune Pollack M.D on 01/25/2017 at 1:56 PM  Between 7am to 6pm - Pager - [phone]  After 6pm go to [email or web address] - password [email or web address] [facility]  [facility]  Office  [phone]  CC: Primary care physician; Lyndon Code, MD  Note: This dictation was prepared with Dragon dictation along with smaller phrase technology. Any transcriptional errors that result from this process are unintentional.

## 2017-01-25 NOTE — Progress Notes (Signed)
Physical Therapy Treatment Patient Details Name: Dustin CoriaDennis R [name] MRN: [id number] DOB: [birth year] Today's Date: 01/25/2017    History of Present Illness  61 y.o. male with a known history of severe COPD, chronic respiratory failure, hypertension, depression, anxiety who presents to the hospital due to cough and shortness of breath. Patient says that he's had a persistent cough which is nonproductive with yellow-green sputum.  He arrived to ED with O2 in the low 70s - continues to have labored breathing with low O2 sats.    PT Comments    Pt in bed, agrees to session.  Bed mobility and transfers without assist.  He was able to stand and ambulate 50' with walker and supervision with slow steady gait without LOB or buckling.  To bedside commode for medium soft BM - nursing tech notified.  O2 sats at rest 89% upon arrival.  With deep breathing, he was able to increase to 92%.  After gait he dropped to 82% but recovered with seated rest and deep breathing.  O2 at 4 lpm.    Discussed assistive device use.  Pt stated he has but does not use a walker at home.  He stated he does not like the idea of a cane.  Discussed use of walker for energy conservation due to O2 sats to allow for increased functional mobility and decreased fatigue.  He stated it would be hard in his home to use the walker.  Encouraged pt to take it with him when walking outside or in the community as needed. A rollator walker with a seat may be beneficial for pt to allow seated rest breaks and a place to carry O2 concentrator given his good balance at this time.   Follow Up Recommendations  Home health PT     Equipment Recommendations       Recommendations for Other Services       Precautions / Restrictions Precautions Precautions: Other (comment) Precaution Comments: monitor O2 sats Restrictions Weight Bearing Restrictions: No    Mobility  Bed Mobility Overal bed mobility: Independent                 Transfers Overall transfer level: Independent                  Ambulation/Gait Ambulation/Gait assistance: Modified independent (Device/Increase time) Ambulation Distance (Feet): 50 Feet Assistive device: Rolling walker (2 wheeled) Gait Pattern/deviations: WFL(Within Functional Limits)         Stairs            Wheelchair Mobility    Modified Rankin (Stroke Patients Only)       Balance Overall balance assessment: Independent                                          Cognition Arousal/Alertness: Awake/alert Behavior During Therapy: WFL for tasks assessed/performed Overall Cognitive Status: Within Functional Limits for tasks assessed                                 General Comments: reports anxiety at times       Exercises      General Comments        Pertinent Vitals/Pain Pain Assessment: No/denies pain    Home Living  Prior Function            PT Goals (current goals can now be found in the care plan section) Progress towards PT goals: Progressing toward goals    Frequency    Min 2X/week      PT Plan Current plan remains appropriate    Co-evaluation              AM-PAC PT "6 Clicks" Daily Activity  Outcome Measure  Difficulty turning over in bed (including adjusting bedclothes, sheets and blankets)?: None Difficulty moving from lying on back to sitting on the side of the bed? : None Difficulty sitting down on and standing up from a chair with arms (e.g., wheelchair, bedside commode, etc,.)?: None Help needed moving to and from a bed to chair (including a wheelchair)?: None Help needed walking in hospital room?: A Little Help needed climbing 3-5 steps with a railing? : A Little 6 Click Score: 22    End of Session Equipment Utilized During Treatment: Gait belt;Oxygen Activity Tolerance: Patient limited by fatigue Patient left: in chair;with call bell/phone  within reach         Time: 1610-9604 PT Time Calculation (min) (ACUTE ONLY): 20 min  Charges:  $Gait Training: 8-22 mins                    G Codes:       Danielle Dess, PTA 01/25/17, 11:01 AM

## 2017-01-26 LAB — CREATININE, SERUM
Creatinine, Ser: 1.04 mg/dL (ref 0.61–1.24)
GFR calc non Af Amer: >60 mL/min (ref >60)

## 2017-01-26 LAB — CBC
HEMATOCRIT: 36.8 % — AB (ref 40.0–52.0)
HEMOGLOBIN: 12.1 g/dL — AB (ref 13.0–18.0)
MCH: 31.4 pg (ref 26.0–34.0)
MCHC: 32.8 g/dL (ref 32.0–36.0)
MCV: 95.7 fL (ref 80.0–100.0)
Platelets: 291 10*3/uL (ref 150–440)
RBC: 3.85 MIL/uL — ABNORMAL LOW (ref 4.40–5.90)
RDW: 13.7 % (ref 11.5–14.5)
WBC: 13.3 10*3/uL — ABNORMAL HIGH (ref 3.8–10.6)

## 2017-01-26 MED ORDER — METHYLPREDNISOLONE SODIUM SUCC 40 MG IJ SOLR
40.0000 mg | Freq: Two times a day (BID) | INTRAMUSCULAR | Status: DC
  Administered 2017-01-26 – 2017-01-29 (×6): 40 mg via INTRAVENOUS
  Filled 2017-01-26 (×6): qty 1

## 2017-01-26 NOTE — Care Management Important Message (Signed)
Important Message  Patient Details  Name: Dustin [name] MRN: [id number] Date of Birth: 1956/05/17   Medicare Important Message Given:  Yes    Marily MemosLisa M Yanett Conkright, RN 01/26/2017, 9:49 AM

## 2017-01-26 NOTE — Progress Notes (Signed)
[employer] [facility] [place] at [facility]   PATIENT NAME: Dustin [name]    MR#:  [id number]  DATE OF BIRTH:  June 15, 1956  SUBJECTIVE:  CHIEF COMPLAINT:   Chief Complaint  Patient presents with  . Shortness of Breath   better cough, wheezing and shortness of breath. On oxygen by nasal cannular 4 L. REVIEW OF SYSTEMS:  Review of Systems  Constitutional: Positive for malaise/fatigue. Negative for chills and fever.  HENT: Negative for congestion and sore throat.   Eyes: Negative for blurred vision and double vision.  Respiratory: Positive for cough, sputum production and shortness of breath. Negative for hemoptysis, wheezing and stridor.   Cardiovascular: Negative for chest pain, orthopnea and leg swelling.  Gastrointestinal: Negative for abdominal pain, blood in stool, constipation, diarrhea, melena, nausea and vomiting.  Genitourinary: Negative for dysuria and hematuria.  Musculoskeletal: Negative for back pain.  Skin: Negative for itching and rash.  Neurological: Positive for weakness. Negative for dizziness, focal weakness and loss of consciousness.  Psychiatric/Behavioral: Negative for depression. The patient is nervous/anxious.     DRUG ALLERGIES:   Allergies  Allergen Reactions  . Asa [Aspirin] Other (See Comments)    Reaction: swelling of the right side.  . Bee Venom Swelling  . Codeine Other (See Comments)    Reaction: Difficulty breathing  . Ibuprofen Other (See Comments)    Reaction: Swelling of the right side.   VITALS:  Blood pressure 132/70, pulse 86, temperature 98 F (36.7 C), temperature source Oral, resp. rate 16, height 5\' 11"  (1.803 m), weight 217 lb 11.2 oz (98.7 kg), SpO2 94 %. PHYSICAL EXAMINATION:  Physical Exam  Constitutional: He is oriented to person, place, and time and well-developed, well-nourished, and in no distress.  Obese.  HENT:  Head: Normocephalic.  Mouth/Throat: Oropharynx is clear and moist.  Eyes: Conjunctivae and  EOM are normal. No scleral icterus.  Neck: Normal range of motion. Neck supple. No JVD present. No tracheal deviation present.  Cardiovascular: Normal rate, regular rhythm and normal heart sounds.  Exam reveals no gallop.   No murmur heard. Pulmonary/Chest: Effort normal. No respiratory distress. He has wheezes. He has no rales. He exhibits no tenderness.  Abdominal: Soft. Bowel sounds are normal. He exhibits no distension. There is no tenderness.  Musculoskeletal: Normal range of motion. He exhibits no edema or tenderness.  Neurological: He is alert and oriented to person, place, and time. No cranial nerve deficit.  Skin: No rash noted. No erythema.  Psychiatric: Affect and judgment normal.   LABORATORY PANEL:  Male CBC  Recent Labs Lab 01/26/17 0453  WBC 13.3*  HGB 12.1*  HCT 36.8*  PLT 291   ------------------------------------------------------------------------------------------------------------------ Chemistries   Recent Labs Lab 01/22/17 1614 01/23/17 0547  01/24/17 0430 01/26/17 0453  NA 139 143  --   --   --   K 3.5 5.4*  < > 4.4  --   CL 97* 104  --   --   --   CO2 33* 36*  --   --   --   GLUCOSE 97 143*  --   --   --   BUN 12 10  --   --   --   CREATININE 1.07 0.89  --   --  1.04  CALCIUM 8.8* 8.8*  --   --   --   AST 22  --   --   --   --   ALT 26  --   --   --   --  ALKPHOS 89  --   --   --   --   BILITOT 0.6  --   --   --   --   < > = values in this interval not displayed. RADIOLOGY:  No results found. ASSESSMENT AND PLAN:   61 year old male with past medical history of COPD, chronic respiratory failure, anxiety, depression, restless leg syndrome, neuropathy who presents to the hospital due to shortness of breath and noted to be in COPD exacerbation.  1. Acute on chronic respiratory failure with hypoxia-secondary to COPD exacerbation. Try to wean down O2 [place], IV steroids, scheduled DuoNeb's, Pulmicort nebs and antibiotics.  2. COPD  exacerbation-secondary to HAP or aspiration PNA (multifocal pneumonia as noted on the CT of the chest). continue IV steroids, scheduled DuoNeb's, Pulmicort nebs. Continue theophylline Patient has been treated as an outpatient with oral Levaquin, doxycycline, erythromycin, Bactrim without much improvement. pro calcitonin is 0.11, dicontinued IV ceftriaxone, Zithromax. The patient the possible has HAP and aspiration pneumonia, started and continue vancomycin and Zosyn per Dr. Carolynne EdouardSadaat Khan.   3. Anxiety/depression-continue Xanax, Lexapro.  4. Neuropathy-continue gabapentin.  5. Restless leg syndrome-continue Requip.  Hyperkalemia. Improved.  Tobacco abuse. Smoking cessation was counseled for 4 minutes. Nicotine patch. PT: HHPT.  I discussed with Dr. Carolynne EdouardSadaat Khan. All the records are reviewed and case discussed with Care Management/Social Worker. Management plans discussed with the patient, family and they are in agreement.  CODE STATUS: Full Code  TOTAL TIME TAKING CARE OF THIS PATIENT: 36 minutes.   More than 50% of the time was spent in counseling/coordination of care: YES  POSSIBLE D/C IN 3 DAYS, DEPENDING ON CLINICAL CONDITION.   Shaune Pollackhen, Jordana Dugue M.D on 01/26/2017 at 1:51 PM  Between 7am to 6pm - Pager - [phone]  After 6pm go to [email or web address] - password [email or web address] [facility]  [facility] [place] [facility]  Office  [phone]  CC: Primary care physician; Lyndon CodeKhan, Fozia M, MD  Note: This dictation was prepared with Dragon dictation along with smaller phrase technology. Any transcriptional errors that result from this process are unintentional.

## 2017-01-26 NOTE — Progress Notes (Signed)
Physical Therapy Treatment Patient Details Name: Dustin [name] MRN: [id number] DOB: 1956/04/02 Today's Date: 01/26/2017    History of Present Illness  61 y.o. male with a known history of severe COPD, chronic respiratory failure, hypertension, depression, anxiety who presents to the hospital due to cough and shortness of breath. Patient says that he's had a persistent cough which is nonproductive with yellow-green sputum.  He arrived to ED with O2 in the low 70s - continues to have labored breathing with low O2 sats.    PT Comments    Pt in bed watching TV upon entry and agreeable to PT session. Pt was modified independent with bed mobility, CGA for transfers, ambulation 44ft with RW, and 4 stairs with rail. Pt was initially on 4L O2. Pt's O2 sats started at 91% at beginning of session, dropped to 87% x2 with sitting EOB and ambulating 23ft in room and returned to 90% with sitting rest break and pursed lip breathing each time. Pt's O2 increased to 6L for stairs d/t O2 starting at 91% and dropping with other mobility (RN notified). Pt's O2 >90% after ascending stairs and dropped to 84% after descending stairs on 6L, but returned to >90% with a sitting rest break and pursed lip breathing and remained >90% on 4L at end of session (RN notified). PT will attempt to increase ambulation distance and continue with exercise program during next session.    Follow Up Recommendations  Home health PT ([facility] referral?)     Engineer, agricultural with 5" wheels (Pt reports he has RW at home)    Recommendations for Other Services       Precautions / Restrictions Precautions Precautions: Fall Precaution Comments: monitor O2 sats Restrictions Weight Bearing Restrictions: No    Mobility  Bed Mobility Overal bed mobility: Modified Independent             General bed mobility comments: Pt required increased time and effor to sit EOB on 4L O2. O2 sats dropped to 88%, but  quickly returned to >90% with pursed lip breathing.   Transfers Overall transfer level: Needs assistance Equipment used: Rolling walker (2 wheeled) Transfers: Sit to/from [employer] Sit to Stand: Min guard (for safety) Stand pivot transfers: Min guard (for safety)       General transfer comment: Pt steady on his feet with no LOB; v/c's provided for hand placement for standing and sitting with RW  Ambulation/Gait Ambulation/Gait assistance: Min guard Ambulation Distance (Feet): 30 Feet Assistive device: Rolling walker (2 wheeled) Gait Pattern/deviations: WFL(Within Functional Limits)   Gait velocity interpretation: Below normal speed for age/gender General Gait Details: Pt ambulates at a slow steady pace w/o LOB. O2 sats dropped to 87% on 4L O2 after 30 ft, but returned to >90% with sitting rest break and pursed lip breathing   Stairs Stairs: Yes   Stair Management: One rail Right;Step to pattern;Forwards Number of Stairs: 4 General stair comments: Pt used good safety awareness and took short rest breaks between each step up, O2 sats at 90% at top of stairs and at 84% at bottom of stairs on 6L O2, but returned to >90% with sitting rest break and pursed lip breathing. Pt reported feeling increasingly anxious at the top of the steps, but his anxiety reduced once sitting in the chair after decending steps.  Wheelchair Mobility    Modified Rankin (Stroke Patients Only)       Balance Overall balance assessment: Independent  Cognition Arousal/Alertness: Awake/alert Behavior During Therapy: WFL for tasks assessed/performed Overall Cognitive Status: Within Functional Limits for tasks assessed                                 General Comments: reports anxiety at times       Exercises      General Comments        Pertinent Vitals/Pain Pain Assessment: No/denies pain    Home Living                       Prior Function            PT Goals (current goals can now be found in the care plan section) Acute Rehab PT Goals Patient Stated Goal: to go home on 2L O2 PT Goal Formulation: With patient Time For Goal Achievement: 02/09/17 Potential to Achieve Goals: Good Progress towards PT goals: Progressing toward goals    Frequency    Min 2X/week      PT Plan Current plan remains appropriate    Co-evaluation              AM-PAC PT "6 Clicks" Daily Activity  Outcome Measure  Difficulty turning over in bed (including adjusting bedclothes, sheets and blankets)?: None Difficulty moving from lying on back to sitting on the side of the bed? : A Little Difficulty sitting down on and standing up from a chair with arms (e.g., wheelchair, bedside commode, etc,.)?: A Little Help needed moving to and from a bed to chair (including a wheelchair)?: A Little Help needed walking in hospital room?: A Little Help needed climbing 3-5 steps with a railing? : A Little 6 Click Score: 19    End of Session Equipment Utilized During Treatment: Gait belt;Oxygen (4-6L O2) Activity Tolerance:  (Pt limited by oxygen desaturation) Patient left: in chair;with call bell/phone within reach (no chair alarm in room (nursing notified and cleared pt to stay in chair w/o chair alarm) Nurse Communication: Mobility status (Oxygen desaturation with mobility (see PT comments)) PT Visit Diagnosis: Difficulty in walking, not elsewhere classified (R26.2)     Time: [id number] PT Time Calculation (min) (ACUTE ONLY): 43 min  Charges:                       G Codes:         Myriam Brandhorst, SPT 01/26/2017, 11:21 AM

## 2017-01-27 LAB — CULTURE, BLOOD (ROUTINE X 2)
CULTURE: NO GROWTH
CULTURE: NO GROWTH

## 2017-01-27 LAB — VANCOMYCIN, TROUGH: VANCOMYCIN TR: 15 ug/mL (ref 15–20)

## 2017-01-27 NOTE — Progress Notes (Signed)
[employer] - [place] at [facility]   PATIENT NAME: Dustin [name]    MR#:  [id number]  DATE OF BIRTH:  11-22-1955  SUBJECTIVE:  CHIEF COMPLAINT:   Chief Complaint  Patient presents with  . Shortness of Breath   Mild cough, no wheezing and shortness of breath. On oxygen by nasal cannular 2.5 L. REVIEW OF SYSTEMS:  Review of Systems  Constitutional: Negative for chills, fever and malaise/fatigue.  HENT: Negative for congestion and sore throat.   Eyes: Negative for blurred vision and double vision.  Respiratory: Positive for cough and shortness of breath. Negative for hemoptysis, sputum production, wheezing and stridor.   Cardiovascular: Negative for chest pain, orthopnea and leg swelling.  Gastrointestinal: Negative for abdominal pain, blood in stool, constipation, diarrhea, melena, nausea and vomiting.  Genitourinary: Negative for dysuria and hematuria.  Musculoskeletal: Negative for back pain.  Skin: Negative for itching and rash.  Neurological: Negative for dizziness, focal weakness, loss of consciousness and weakness.  Psychiatric/Behavioral: Negative for depression. The patient is not nervous/anxious.     DRUG ALLERGIES:   Allergies  Allergen Reactions  . Asa [Aspirin] Other (See Comments)    Reaction: swelling of the right side.  . Bee Venom Swelling  . Codeine Other (See Comments)    Reaction: Difficulty breathing  . Ibuprofen Other (See Comments)    Reaction: Swelling of the right side.   VITALS:  Blood pressure 113/76, pulse 83, temperature 98.8 F (37.1 C), temperature source Oral, resp. rate 18, height 5\' 11"  (1.803 m), weight 217 lb 11.2 oz (98.7 kg), SpO2 95 %. PHYSICAL EXAMINATION:  Physical Exam  Constitutional: He is oriented to person, place, and time and well-developed, well-nourished, and in no distress.  Obese.  HENT:  Head: Normocephalic.  Mouth/Throat: Oropharynx is clear and moist.  Eyes: Conjunctivae and EOM are normal. No  scleral icterus.  Neck: Normal range of motion. Neck supple. No JVD present. No tracheal deviation present.  Cardiovascular: Normal rate, regular rhythm and normal heart sounds.  Exam reveals no gallop.   No murmur heard. Pulmonary/Chest: Effort normal. No respiratory distress. He has wheezes. He has no rales. He exhibits no tenderness.  Abdominal: Soft. Bowel sounds are normal. He exhibits no distension. There is no tenderness.  Musculoskeletal: Normal range of motion. He exhibits no edema or tenderness.  Neurological: He is alert and oriented to person, place, and time. No cranial nerve deficit.  Skin: No rash noted. No erythema.  Psychiatric: Affect and judgment normal.   LABORATORY PANEL:  Male CBC  Recent Labs Lab 01/26/17 0453  WBC 13.3*  HGB 12.1*  HCT 36.8*  PLT 291   ------------------------------------------------------------------------------------------------------------------ Chemistries   Recent Labs Lab 01/22/17 1614 01/23/17 0547  01/24/17 0430 01/26/17 0453  NA 139 143  --   --   --   K 3.5 5.4*  < > 4.4  --   CL 97* 104  --   --   --   CO2 33* 36*  --   --   --   GLUCOSE 97 143*  --   --   --   BUN 12 10  --   --   --   CREATININE 1.07 0.89  --   --  1.04  CALCIUM 8.8* 8.8*  --   --   --   AST 22  --   --   --   --   ALT 26  --   --   --   --  ALKPHOS 89  --   --   --   --   BILITOT 0.6  --   --   --   --   < > = values in this interval not displayed. RADIOLOGY:  No results found. ASSESSMENT AND PLAN:   61 year old male with past medical history of COPD, chronic respiratory failure, anxiety, depression, restless leg syndrome, neuropathy who presents to the hospital due to shortness of breath and noted to be in COPD exacerbation.  1. Acute on chronic respiratory failure with hypoxia-secondary to COPD exacerbation. Try to wean down O2 [place], taper IV steroids, DuoNeb prn, Pulmicort nebs and antibiotics.  2. COPD exacerbation-secondary to HAP or  aspiration PNA (multifocal pneumonia as noted on the CT of the chest). Try to wean down O2 [place], taper IV steroids, DuoNeb prn, Pulmicort Continue theophylline Patient has been treated as an outpatient with oral Levaquin, doxycycline, erythromycin, Bactrim without much improvement. pro calcitonin is 0.11, dicontinued IV ceftriaxone, Zithromax. The patient the possible has HAP and aspiration pneumonia, started and continue vancomycin and Zosyn per Dr. Carolynne EdouardSadaat Khan.   3. Anxiety/depression-continue Xanax, Lexapro.  4. Neuropathy-continue gabapentin.  5. Restless leg syndrome-continue Requip.  Hyperkalemia. Improved.  Tobacco abuse. Smoking cessation was counseled for 4 minutes. Nicotine patch. PT: HHPT.  All the records are reviewed and case discussed with Care Management/Social Worker. Management plans discussed with the patient, family and they are in agreement.  CODE STATUS: Full Code  TOTAL TIME TAKING CARE OF THIS PATIENT: 33 minutes.   More than 50% of the time was spent in counseling/coordination of care: YES  POSSIBLE D/C IN 2 DAYS, DEPENDING ON CLINICAL CONDITION.   Shaune Pollackhen, Tory Mckissack M.D on 01/27/2017 at 11:07 AM  Between 7am to 6pm - Pager - [phone]  After 6pm go to [email or web address] - password [email or web address] [facility]  [facility] [place] [facility]  Office  [phone]  CC: Primary care physician; Lyndon CodeKhan, Fozia M, MD  Note: This dictation was prepared with Dragon dictation along with smaller phrase technology. Any transcriptional errors that result from this process are unintentional.

## 2017-01-27 NOTE — Progress Notes (Addendum)
Pharmacy Antibiotic Note  Dustin CoriaDennis R [name] is a 61 y.o. male admitted on 01/22/2017 with pneumonia.  Pharmacy has been consulted for vancomycin and piperacillin/tazo dosing. Spoke with Dr. Imogene Burnhen about negative MRSA PCR but pulmonologist would like to give vancomycin. Pt has increased sputum production and color has changed. Pt received vancomycin 1000 mg IV x 1.   Plan: Begin piperacillin/tazobactam 3.375 IV q 8 hours  Begin vancomycin 1500 mg IV q 12 hours (6 hour stacked dosing). Trough to be drawn prior to 4th dose as this should represent steady state with a goal of 15-20. Cmin at ss estimated to be 16.5. Did not dose q8 hours due to age and weight (accumulation) Pk: t1/2=7.7                 Vd=60              Ke=0.9  5/12 @ 0028 VT 15 drawn appropriately, will continue current dose and recheck next VT 5/13 @ 1200 to ensure therapeutic level of 15 - 20 mcg/mL.  Height: 5\' 11"  (180.3 cm) Weight: 217 lb 11.2 oz (98.7 kg) IBW/kg (Calculated) : 75.3  Temp (24hrs), Avg:98.3 F (36.8 C), Min:98.1 F (36.7 C), Max:98.4 F (36.9 C)   Last Labs    Recent Labs Lab 01/22/17 1614 01/22/17 1834 01/23/17 0547  WBC 7.9  --  8.0  CREATININE 1.07  --  0.89  LATICACIDVEN  --  0.9  --       Estimated Creatinine Clearance: 103.1 mL/min (by C-G formula based on SCr of 0.89 mg/dL).         Allergies  Allergen Reactions  . Asa [Aspirin] Other (See Comments)    Reaction: swelling of the right side.  . Bee Venom Swelling  . Codeine Other (See Comments)    Reaction: Difficulty breathing  . Ibuprofen Other (See Comments)    Reaction: Swelling of the right side.    Antimicrobials this admission: Azithromycin and ceftriaxone 5/7 >> 5/8 Piperacillin/tazo 5/10 >>  Vancomycin 5/10 >>  Dose adjustments this admission:  Microbiology results: 5/7 BCx: NG x 3 days 5/7 Sputum: normal flora  5/9 MRSA PCR: negative  Thank you for allowing pharmacy to be a part of this  patient's care.  Thomasene Rippleavid Aarron Wierzbicki, PharmD, BCPS Clinical Pharmacist 01/27/2017

## 2017-01-28 LAB — CREATININE, SERUM
CREATININE: 1.17 mg/dL (ref 0.61–1.24)
GFR calc Af Amer: >60 mL/min (ref >60)
GFR calc non Af Amer: >60 mL/min (ref >60)

## 2017-01-28 LAB — VANCOMYCIN, TROUGH: VANCOMYCIN TR: 22 ug/mL — AB (ref 15–20)

## 2017-01-28 MED ORDER — VANCOMYCIN HCL 10 G IV SOLR
1250.0000 mg | Freq: Two times a day (BID) | INTRAVENOUS | Status: DC
  Administered 2017-01-28 – 2017-01-29 (×2): 1250 mg via INTRAVENOUS
  Filled 2017-01-28 (×4): qty 1250

## 2017-01-28 NOTE — Progress Notes (Signed)
[employer] - [place] at [facility]   PATIENT NAME: Dustin [name]    MR#:  [id number]  DATE OF BIRTH:  June 16, 1956  SUBJECTIVE:  CHIEF COMPLAINT:   Chief Complaint  Patient presents with  . Shortness of Breath   Still cough and shortness of breath. On oxygen by nasal cannular 3 L. REVIEW OF SYSTEMS:  Review of Systems  Constitutional: Negative for chills, fever and malaise/fatigue.  HENT: Negative for congestion and sore throat.   Eyes: Negative for blurred vision and double vision.  Respiratory: Positive for cough and shortness of breath. Negative for hemoptysis, sputum production, wheezing and stridor.   Cardiovascular: Negative for chest pain, orthopnea and leg swelling.  Gastrointestinal: Negative for abdominal pain, blood in stool, constipation, diarrhea, melena, nausea and vomiting.  Genitourinary: Negative for dysuria and hematuria.  Musculoskeletal: Negative for back pain.  Skin: Negative for itching and rash.  Neurological: Negative for dizziness, focal weakness, loss of consciousness and weakness.  Psychiatric/Behavioral: Negative for depression. The patient is not nervous/anxious.     DRUG ALLERGIES:   Allergies  Allergen Reactions  . Asa [Aspirin] Other (See Comments)    Reaction: swelling of the right side.  . Bee Venom Swelling  . Codeine Other (See Comments)    Reaction: Difficulty breathing  . Ibuprofen Other (See Comments)    Reaction: Swelling of the right side.   VITALS:  Blood pressure 111/62, pulse (!) 102, temperature 97.9 F (36.6 C), temperature source Oral, resp. rate 18, height 5\' 11"  (1.803 m), weight 217 lb 11.2 oz (98.7 kg), SpO2 90 %. PHYSICAL EXAMINATION:  Physical Exam  Constitutional: He is oriented to person, place, and time and well-developed, well-nourished, and in no distress.  Obese.  HENT:  Head: Normocephalic.  Mouth/Throat: Oropharynx is clear and moist.  Eyes: Conjunctivae and EOM are normal. No scleral  icterus.  Neck: Normal range of motion. Neck supple. No JVD present. No tracheal deviation present.  Cardiovascular: Normal rate, regular rhythm and normal heart sounds.  Exam reveals no gallop.   No murmur heard. Pulmonary/Chest: Effort normal. No respiratory distress. He has no wheezes. He has no rales. He exhibits no tenderness.  Very diminished lung sounds.  Abdominal: Soft. Bowel sounds are normal. He exhibits no distension. There is no tenderness.  Musculoskeletal: Normal range of motion. He exhibits no edema or tenderness.  Neurological: He is alert and oriented to person, place, and time. No cranial nerve deficit.  Skin: No rash noted. No erythema.  Psychiatric: Affect and judgment normal.   LABORATORY PANEL:  Male CBC  Recent Labs Lab 01/26/17 0453  WBC 13.3*  HGB 12.1*  HCT 36.8*  PLT 291   ------------------------------------------------------------------------------------------------------------------ Chemistries   Recent Labs Lab 01/22/17 1614 01/23/17 0547  01/24/17 0430 01/26/17 0453  NA 139 143  --   --   --   K 3.5 5.4*  < > 4.4  --   CL 97* 104  --   --   --   CO2 33* 36*  --   --   --   GLUCOSE 97 143*  --   --   --   BUN 12 10  --   --   --   CREATININE 1.07 0.89  --   --  1.04  CALCIUM 8.8* 8.8*  --   --   --   AST 22  --   --   --   --   ALT 26  --   --   --   --  ALKPHOS 89  --   --   --   --   BILITOT 0.6  --   --   --   --   < > = values in this interval not displayed. RADIOLOGY:  No results found. ASSESSMENT AND PLAN:   61 year old male with past medical history of COPD, chronic respiratory failure, anxiety, depression, restless leg syndrome, neuropathy who presents to the hospital due to shortness of breath and noted to be in COPD exacerbation.  1. Acute on chronic respiratory failure with hypoxia-secondary to COPD exacerbation. Try to wean down O2 [place], taper IV steroids, DuoNeb prn, Pulmicort nebs and antibiotics.  2. COPD  exacerbation-secondary to HAP or aspiration PNA (multifocal pneumonia as noted on the CT of the chest). Try to wean down O2 [place], taper IV steroids, DuoNeb prn, Pulmicort Continue theophylline Patient has been treated as an outpatient with oral Levaquin, doxycycline, erythromycin, Bactrim without much improvement. pro calcitonin is 0.11, dicontinued IV ceftriaxone, Zithromax. The patient the possible has HAP and aspiration pneumonia, started and continue vancomycin and Zosyn per Dr. Carolynne EdouardSadaat Khan.   3. Anxiety/depression-continue Xanax, Lexapro.  4. Neuropathy-continue gabapentin.  5. Restless leg syndrome-continue Requip.  Hyperkalemia. Improved.  Tobacco abuse. Smoking cessation was counseled for 4 minutes. Nicotine patch. PT: HHPT.  All the records are reviewed and case discussed with Care Management/Social Worker. Management plans discussed with the patient, family and they are in agreement.  CODE STATUS: Full Code  TOTAL TIME TAKING CARE OF THIS PATIENT: 33 minutes.   More than 50% of the time was spent in counseling/coordination of care: YES  POSSIBLE D/C IN 2 DAYS, DEPENDING ON CLINICAL CONDITION.   Shaune Pollackhen, Dian Laprade M.D on 01/28/2017 at 12:27 PM  Between 7am to 6pm - Pager - [phone]  After 6pm go to [email or web address] - password [email or web address] [email or web address]  [facility] [place] [facility]  Office  [phone]  CC: Primary care physician; Lyndon CodeKhan, Fozia M, MD  Note: This dictation was prepared with Dragon dictation along with smaller phrase technology. Any transcriptional errors that result from this process are unintentional.

## 2017-01-28 NOTE — Progress Notes (Signed)
Pharmacy Antibiotic Note  Dustin [name] is a 61 y.o. male admitted on 01/22/2017 with pneumonia.  Pharmacy has been consulted for vancomycin and piperacillin/tazo dosing.  Spoke with Dr. Imogene Burnhen about negative MRSA PCR but pulmonologist would like to give vancomycin. Pt has increased sputum production and color has changed. Pt received vancomycin 1000 mg IV x 1.   Plan: Begin piperacillin/tazobactam 3.375 IV q 8 hours  Begin vancomycin 1500 mg IV q 12 hours (6 hour stacked dosing). Trough to be drawn prior to 4th dose as this should represent steady state with a goal of 15-20. Cmin at ss estimated to be 16.5. Did not dose q8 hours due to age and weight (accumulation) Pk: t1/2=7.7                 Vd=60              Ke=0.9  5/12 @ 0028 VT 15 drawn appropriately, will continue current dose and recheck next VT 5/13 @ 1200 to ensure therapeutic level of 15 - 20 mcg/mL.  5/13  Vanc trough@ 1414= 22 mcg/ml. Will adjust Vancomycin to 1250mg  IV Q12h to begin at 1800.  Ke 0.062  T1/2 11.18, Vd 59   AdjBW= 84.7 kg Will check next trough prior to 3rd dose of new regimen to make sure pt not accumulating. Patient on Lasix and Zosyn- watch renal fxn.     Height: 5\' 11"  (180.3 cm) Weight: 217 lb 11.2 oz (98.7 kg) IBW/kg (Calculated) : 75.3  Temp (24hrs), Avg:98.3 F (36.8 C), Min:98.1 F (36.7 C), Max:98.4 F (36.9 C)   Last Labs    Recent Labs Lab 01/22/17 1614 01/22/17 1834 01/23/17 0547  WBC 7.9  --  8.0  CREATININE 1.07  --  0.89  LATICACIDVEN  --  0.9  --       Estimated Creatinine Clearance: 103.1 mL/min (by C-G formula based on SCr of 0.89 mg/dL).         Allergies  Allergen Reactions  . Asa [Aspirin] Other (See Comments)    Reaction: swelling of the right side.  . Bee Venom Swelling  . Codeine Other (See Comments)    Reaction: Difficulty breathing  . Ibuprofen Other (See Comments)    Reaction: Swelling of the right side.    Antimicrobials this  admission: Azithromycin and ceftriaxone 5/7 >> 5/8 Piperacillin/tazo 5/10 >>  Vancomycin 5/10 >>  Dose adjustments this admission:  Microbiology results: 5/7 BCx: NG x 3 days 5/7 Sputum: normal flora  5/9 MRSA PCR: negative  Thank you for allowing pharmacy to be a part of this patient's care.  Bari MantisKristin Helen Winterhalter PharmD Clinical Pharmacist 01/28/2017

## 2017-01-28 NOTE — Progress Notes (Signed)
Patient is A&O x4.. Uses urinal at bedside. Tolerated diet. Decreased o2 to 2.5L, Sats in low 90's. Productive cough, Exp. Wheezes bilat lower lobes. Uses call light approp. Vanco trough this evening, dose changed, per pharmacy.

## 2017-01-29 LAB — BASIC METABOLIC PANEL
Anion gap: 8 (ref 5–15)
BUN: 26 mg/dL — AB (ref 6–20)
CALCIUM: 8.7 mg/dL — AB (ref 8.9–10.3)
CHLORIDE: 99 mmol/L — AB (ref 101–111)
CO2: 32 mmol/L (ref 22–32)
CREATININE: 1.07 mg/dL (ref 0.61–1.24)
Glucose, Bld: 132 mg/dL — ABNORMAL HIGH (ref 65–99)
Potassium: 4.6 mmol/L (ref 3.5–5.1)
SODIUM: 139 mmol/L (ref 135–145)

## 2017-01-29 MED ORDER — NICOTINE 14 MG/24HR TD PT24: 14.0000 mg | MEDICATED_PATCH | Freq: Every day | TRANSDERMAL | 0 refills | Status: DC

## 2017-01-29 MED ORDER — PREDNISONE 10 MG PO TABS: 10.0000 mg | ORAL_TABLET | Freq: Every day | ORAL | 0 refills | Status: DC

## 2017-01-29 MED ORDER — AMOXICILLIN-POT CLAVULANATE 875-125 MG PO TABS: 1.0000 | ORAL_TABLET | Freq: Two times a day (BID) | ORAL | 0 refills | Status: DC

## 2017-01-29 NOTE — Discharge Summary (Signed)
[facility] [place] [facility]   PATIENT NAME: Dustin [name]    MR#:  [id number]  DATE OF BIRTH:  Feb 13, 1956  DATE OF ADMISSION:  01/22/2017 ADMITTING PHYSICIAN: Houston Siren, MD  DATE OF DISCHARGE: 01/29/2017  PRIMARY CARE PHYSICIAN: Lyndon Code, MD    ADMISSION DIAGNOSIS:  Community acquired pneumonia of right lung, unspecified part of lung [J18.9]  DISCHARGE DIAGNOSIS:  Active Problems:   COPD exacerbation (HCC) HCAP  SECONDARY DIAGNOSIS:   Past Medical History:  Diagnosis Date  . Allergy   . Anxiety   . Anxiety   . Asthma   . Chronic diastolic CHF (congestive heart failure) (HCC)    a. echo 07/2013: EF 60-65%, DD, biatrial dilatation, Ao sclerosis, dilated RV, moderate pulmonary HTN, elevated CV and RA pressures; b. patient reported echo at Dr. Milta Deiters office 02/2015 - his office does not have record of him being a pt there c. echo 11/2015: EF 60-65%, Grade 1 DD, mod-severe pulm pressures  . Chronic respiratory failure (HCC)    a. on 2L via nasal cannula; b. secondary to COPD  . COPD (chronic obstructive pulmonary disease) (HCC)   . Depression   . Depression   . Depression   . Emphysema of lung (HCC)   . GERD (gastroesophageal reflux disease)   . Hypertension   . Personal history of tobacco use, presenting hazards to health 08/17/2015  . Tobacco abuse     HOSPITAL COURSE:   61 year old male with chronic respiratory failure and she liters oxygen due to COPD presented with COPD exacerbation and pneumonia.  1. Acute on chronic hypoxic respiratory failure necessitating of COPD exacerbation and pneumonia Patient is back on baseline 2 L of oxygen  2. HCAP: Patient was evaluated by pulmonologist. Recommendations were to start antibiotics. Patient will be discharged to Zosyn. Patient had no elevated white blood cell count or fevers prior to discharge. Blood cultures are negative.  3. COPD acute exacerbation: Patient with IV steroids and will be  transitioned to oral steroids. He will continue inhalers and nebulizer treatments. He will continue theophylline 4.Tobacco dependence: Patient is encouraged to quit smoking. Counseling was provided for 4 minutes.   5.Depression: Patient will continue Lexapro   DISCHARGE CONDITIONS AND DIET:    Stable for discharge on heart healthy diet  CONSULTS OBTAINED:  Treatment Team:  Yevonne Pax, MD  DRUG ALLERGIES:   Allergies  Allergen Reactions  . Asa [Aspirin] Other (See Comments)    Reaction: swelling of the right side.  . Bee Venom Swelling  . Codeine Other (See Comments)    Reaction: Difficulty breathing  . Ibuprofen Other (See Comments)    Reaction: Swelling of the right side.    DISCHARGE MEDICATIONS:   Current Discharge Medication List    START taking these medications   Details  amoxicillin-clavulanate (AUGMENTIN) 875-125 MG tablet Take 1 tablet by mouth 2 (two) times daily. Qty: 16 tablet, Refills: 0    nicotine (NICODERM CQ - DOSED IN MG/24 HOURS) 14 mg/24hr patch Place 1 patch (14 mg total) onto the skin daily. Qty: 28 patch, Refills: 0    predniSONE (DELTASONE) 10 MG tablet Take 1 tablet (10 mg total) by mouth daily with breakfast. 60 mg PO (ORAL) x 2 days 50 mg PO (ORAL)  x 2 days 40 mg PO (ORAL)  x 2 days 30 mg PO  (ORAL)  x 2 days 20 mg PO  (ORAL) x 2 days 10 mg PO  (ORAL) x  2 days then stop Qty: 42 tablet, Refills: 0      CONTINUE these medications which have NOT CHANGED   Details  Albuterol Sulfate (PROAIR RESPICLICK) 108 (90 BASE) MCG/ACT AEPB Inhale 2 puffs into the lungs every 4 (four) hours as needed (for shortness of breath). Reported on 12/10/2015    ALPRAZolam (XANAX) 1 MG tablet Take 1 tablet (1 mg total) by mouth 3 (three) times daily as needed for anxiety. Qty: 15 tablet, Refills: 0    Cholecalciferol (VITAMIN D) 2000 units CAPS Take 2,000 Units by mouth daily.    diltiazem (CARDIZEM) 30 MG tablet Take 1 tablet (30 mg total) by mouth 3  (three) times daily as needed. Qty: 90 tablet, Refills: 6    escitalopram (LEXAPRO) 20 MG tablet Take 20 mg by mouth at bedtime.     furosemide (LASIX) 20 MG tablet Take 20 mg by mouth daily.     gabapentin (NEURONTIN) 600 MG tablet Take 300 mg by mouth 2 (two) times daily.     ipratropium-albuterol (DUONEB) 0.5-2.5 (3) MG/3ML SOLN Take 3 mLs by nebulization 4 (four) times daily as needed (for shortness of breath).    mometasone-formoterol (DULERA) 200-5 MCG/ACT AERO Inhale 1 puff into the lungs 2 (two) times daily.     potassium chloride (K-DUR,KLOR-CON) 10 MEQ tablet Take 10 mEq by mouth once.    Probiotic Product (PROBIOTIC-10) CAPS Take 10 capsules by mouth daily.    rOPINIRole (REQUIP) 0.25 MG tablet Take 1 tablet by mouth 2 (two) times daily. Refills: 3    theophylline (UNIPHYL) 400 MG 24 hr tablet Take 400 mg by mouth 2 (two) times daily.    Tiotropium Bromide Monohydrate (SPIRIVA RESPIMAT) 2.5 MCG/ACT AERS Inhale 2 puffs into the lungs daily.       STOP taking these medications     predniSONE (STERAPRED UNI-PAK 21 TAB) 10 MG (21) TBPK tablet           Today   CHIEF COMPLAINT:  Patient is doing well. His morning. Still has cough and shortness of breath and wheezing improved   VITAL SIGNS:  Blood pressure 101/60, pulse 92, temperature 98.4 F (36.9 C), temperature source Oral, resp. rate 20, height 5\' 11"  (1.803 m), weight 98.7 kg (217 lb 11.2 oz), SpO2 92 %.   REVIEW OF SYSTEMS:  Review of Systems  Constitutional: Negative.  Negative for chills, fever and malaise/fatigue.  HENT: Negative.  Negative for ear discharge, ear pain, hearing loss, nosebleeds and sore throat.   Eyes: Negative.  Negative for blurred vision and pain.  Respiratory: Positive for cough. Negative for hemoptysis, shortness of breath and wheezing.   Cardiovascular: Negative.  Negative for chest pain, palpitations and leg swelling.  Gastrointestinal: Negative.  Negative for abdominal pain,  blood in stool, diarrhea, nausea and vomiting.  Genitourinary: Negative.  Negative for dysuria.  Musculoskeletal: Negative.  Negative for back pain.  Skin: Negative.   Neurological: Negative for dizziness, tremors, speech change, focal weakness, seizures and headaches.  Endo/Heme/Allergies: Negative.  Does not bruise/bleed easily.  Psychiatric/Behavioral: Negative.  Negative for depression, hallucinations and suicidal ideas.     PHYSICAL EXAMINATION:  GENERAL:  61 y.o.-year-old patient lying in the bed with no acute distress.  NECK:  Supple, no jugular venous distention. No thyroid enlargement, no tenderness.  LUNGS: Normal breath sounds bilaterally, no wheezing, rales,rhonchi  No use of accessory muscles of respiration.  CARDIOVASCULAR: S1, S2 normal. No murmurs, rubs, or gallops.  ABDOMEN: Soft, non-tender, non-distended. Bowel sounds present.  No organomegaly or mass.  EXTREMITIES: No pedal edema, cyanosis, or clubbing.  PSYCHIATRIC: The patient is alert and oriented x 3.  SKIN: No obvious rash, lesion, or ulcer.   DATA REVIEW:   CBC  Recent Labs Lab 01/26/17 0453  WBC 13.3*  HGB 12.1*  HCT 36.8*  PLT 291    Chemistries   Recent Labs Lab 01/22/17 1614  01/29/17 0513  NA 139  < > 139  K 3.5  < > 4.6  CL 97*  < > 99*  CO2 33*  < > 32  GLUCOSE 97  < > 132*  BUN 12  < > 26*  CREATININE 1.07  < > 1.07  CALCIUM 8.8*  < > 8.7*  AST 22  --   --   ALT 26  --   --   ALKPHOS 89  --   --   BILITOT 0.6  --   --   < > = values in this interval not displayed.  Cardiac Enzymes  Recent Labs Lab 01/22/17 1614  TROPONINI <0.03    Microbiology Results  @MICRORSLT48 @  RADIOLOGY:  No results found.    Current Discharge Medication List    START taking these medications   Details  amoxicillin-clavulanate (AUGMENTIN) 875-125 MG tablet Take 1 tablet by mouth 2 (two) times daily. Qty: 16 tablet, Refills: 0    nicotine (NICODERM CQ - DOSED IN MG/24 HOURS) 14 mg/24hr  patch Place 1 patch (14 mg total) onto the skin daily. Qty: 28 patch, Refills: 0    predniSONE (DELTASONE) 10 MG tablet Take 1 tablet (10 mg total) by mouth daily with breakfast. 60 mg PO (ORAL) x 2 days 50 mg PO (ORAL)  x 2 days 40 mg PO (ORAL)  x 2 days 30 mg PO  (ORAL)  x 2 days 20 mg PO  (ORAL) x 2 days 10 mg PO  (ORAL) x 2 days then stop Qty: 42 tablet, Refills: 0      CONTINUE these medications which have NOT CHANGED   Details  Albuterol Sulfate (PROAIR RESPICLICK) 108 (90 BASE) MCG/ACT AEPB Inhale 2 puffs into the lungs every 4 (four) hours as needed (for shortness of breath). Reported on 12/10/2015    ALPRAZolam (XANAX) 1 MG tablet Take 1 tablet (1 mg total) by mouth 3 (three) times daily as needed for anxiety. Qty: 15 tablet, Refills: 0    Cholecalciferol (VITAMIN D) 2000 units CAPS Take 2,000 Units by mouth daily.    diltiazem (CARDIZEM) 30 MG tablet Take 1 tablet (30 mg total) by mouth 3 (three) times daily as needed. Qty: 90 tablet, Refills: 6    escitalopram (LEXAPRO) 20 MG tablet Take 20 mg by mouth at bedtime.     furosemide (LASIX) 20 MG tablet Take 20 mg by mouth daily.     gabapentin (NEURONTIN) 600 MG tablet Take 300 mg by mouth 2 (two) times daily.     ipratropium-albuterol (DUONEB) 0.5-2.5 (3) MG/3ML SOLN Take 3 mLs by nebulization 4 (four) times daily as needed (for shortness of breath).    mometasone-formoterol (DULERA) 200-5 MCG/ACT AERO Inhale 1 puff into the lungs 2 (two) times daily.     potassium chloride (K-DUR,KLOR-CON) 10 MEQ tablet Take 10 mEq by mouth once.    Probiotic Product (PROBIOTIC-10) CAPS Take 10 capsules by mouth daily.    rOPINIRole (REQUIP) 0.25 MG tablet Take 1 tablet by mouth 2 (two) times daily. Refills: 3    theophylline (UNIPHYL) 400 MG 24 hr  tablet Take 400 mg by mouth 2 (two) times daily.    Tiotropium Bromide Monohydrate (SPIRIVA RESPIMAT) 2.5 MCG/ACT AERS Inhale 2 puffs into the lungs daily.       STOP taking these  medications     predniSONE (STERAPRED UNI-PAK 21 TAB) 10 MG (21) TBPK tablet          Management plans discussed with the patient and he is in agreement. Stable for discharge home  Patient should follow up with dr Welton Flakeskhan  CODE STATUS:     Code Status Orders        Start     Ordered   01/22/17 2130  Full code  Continuous     01/22/17 2129    Code Status History    Date Active Date Inactive Code Status Order ID Comments User Context   06/23/2016 12:01 AM 06/28/2016  7:43 PM Full Code [id number]  Oralia ManisWillis, David, MD Inpatient   03/25/2016  5:03 PM 03/31/2016 11:08 PM Full Code [id number]  Standley BrookingGoodrich, Daniel P, MD Inpatient   11/30/2015  5:12 AM 12/06/2015  9:34 PM Full Code [id number]  Arnaldo Nataliamond, Michael S, MD Inpatient   04/13/2015  5:22 PM 04/21/2015  7:40 PM Full Code [id number]  Auburn BilberryPatel, Shreyang, MD Inpatient   03/31/2015  8:50 PM 04/05/2015  8:48 PM Full Code [id number]  Hower, Cletis Athensavid K, MD ED      TOTAL TIME TAKING CARE OF THIS PATIENT: 37 minutes.    Note: This dictation was prepared with Dragon dictation along with smaller phrase technology. Any transcriptional errors that result from this process are unintentional.  Rein Popov M.D on 01/29/2017 at 10:35 AM  Between 7am to 6pm - Pager - [phone] After 6pm go to [email or web address] - password [email or web address]  [facility] [place] [facility]  Office  [phone]  CC: Primary care physician; Lyndon CodeKhan, Fozia M, MD

## 2017-01-29 NOTE — Progress Notes (Signed)
Reviewed discharge instructions with patient with verbal understanding, 2 Rxs given at this time. Awaiting son to transport home. VSS at this time.

## 2017-01-29 NOTE — Care Management Note (Signed)
Case Management Note  Patient Details  Name: Dustin [name] MRN: [id number] Date of Birth: 06-Nov-1955  Subjective/Objective:    [name] updated on plan of care and disicharge. Patient updated and is in agreement with POC.                 Action/Plan:   Expected Discharge Date:  01/29/17               Expected Discharge Plan:  Home w Home Health Services  In-House Referral:     Discharge planning Services  CM Consult  Post Acute Care Choice:  Home Health Choice offered to:  Patient  DME Arranged:    DME Agency:     HH Arranged:  PT, RN [name]  Status of Service:  Completed, signed off  If discussed at [employer] Length of Stay Meetings, dates discussed:    Additional Comments:  Marily MemosLisa M Kathrine Rieves, RN 01/29/2017, 10:54 AM

## 2017-01-31 ENCOUNTER — Other Ambulatory Visit: Payer: Self-pay | Admitting: Pharmacist

## 2017-01-31 DIAGNOSIS — Z7952 Long term (current) use of systemic steroids: Secondary | ICD-10-CM | POA: Diagnosis not present

## 2017-01-31 DIAGNOSIS — J9621 Acute and chronic respiratory failure with hypoxia: Secondary | ICD-10-CM | POA: Diagnosis not present

## 2017-01-31 DIAGNOSIS — Z792 Long term (current) use of antibiotics: Secondary | ICD-10-CM | POA: Diagnosis not present

## 2017-01-31 DIAGNOSIS — J439 Emphysema, unspecified: Secondary | ICD-10-CM | POA: Diagnosis not present

## 2017-01-31 DIAGNOSIS — Z9981 Dependence on supplemental oxygen: Secondary | ICD-10-CM | POA: Diagnosis not present

## 2017-01-31 DIAGNOSIS — J189 Pneumonia, unspecified organism: Secondary | ICD-10-CM | POA: Diagnosis not present

## 2017-01-31 DIAGNOSIS — I5032 Chronic diastolic (congestive) heart failure: Secondary | ICD-10-CM | POA: Diagnosis not present

## 2017-01-31 DIAGNOSIS — I110 Hypertensive heart disease with heart failure: Secondary | ICD-10-CM | POA: Diagnosis not present

## 2017-01-31 DIAGNOSIS — G629 Polyneuropathy, unspecified: Secondary | ICD-10-CM | POA: Diagnosis not present

## 2017-01-31 NOTE — Patient Outreach (Signed)
[name] Care Management  01/31/2017  Hershal CoriaDennis R Aro 1956-04-04 [id number]  Incoming call received from patient.  He reports he was recently discharged from hospital.  During call, patient didn't want to review his discharge medications yet---he was calling with questions regarding patient assistance.   Patient reports he did receive Spiriva from [employer].  He reports he received paperwork back from [employer] patient assistance.    Patient reports his application was mailed back to him with an attestation form.  Counseled patient he needs to complete attestation form and return it along with application to [employer] patient assistance program via mail.    Patient states he will complete and return forms for [employer] patient assistance program.   Patient asked about antibiotics being used to treat infections.  He was counseled antibiotics can be used to treat infection caused by bacteria.    Patient reports he did get his amoxicillin/clavulanate filled on discharge.   Plan:  Will place follow-up call to patient in next 2 weeks to follow-up on [employer] patient assistance and medication review.   Tommye StandardKevin Wyman Meschke, PharmD, [facility] Clinical Pharmacist [name] [phone]

## 2017-02-01 DIAGNOSIS — G629 Polyneuropathy, unspecified: Secondary | ICD-10-CM | POA: Diagnosis not present

## 2017-02-01 DIAGNOSIS — I5032 Chronic diastolic (congestive) heart failure: Secondary | ICD-10-CM | POA: Diagnosis not present

## 2017-02-01 DIAGNOSIS — Z792 Long term (current) use of antibiotics: Secondary | ICD-10-CM | POA: Diagnosis not present

## 2017-02-01 DIAGNOSIS — J439 Emphysema, unspecified: Secondary | ICD-10-CM | POA: Diagnosis not present

## 2017-02-01 DIAGNOSIS — J189 Pneumonia, unspecified organism: Secondary | ICD-10-CM | POA: Diagnosis not present

## 2017-02-01 DIAGNOSIS — Z7952 Long term (current) use of systemic steroids: Secondary | ICD-10-CM | POA: Diagnosis not present

## 2017-02-01 DIAGNOSIS — I110 Hypertensive heart disease with heart failure: Secondary | ICD-10-CM | POA: Diagnosis not present

## 2017-02-01 DIAGNOSIS — Z9981 Dependence on supplemental oxygen: Secondary | ICD-10-CM | POA: Diagnosis not present

## 2017-02-01 DIAGNOSIS — J9621 Acute and chronic respiratory failure with hypoxia: Secondary | ICD-10-CM | POA: Diagnosis not present

## 2017-02-05 DIAGNOSIS — J9611 Chronic respiratory failure with hypoxia: Secondary | ICD-10-CM | POA: Diagnosis not present

## 2017-02-05 DIAGNOSIS — I5032 Chronic diastolic (congestive) heart failure: Secondary | ICD-10-CM | POA: Diagnosis not present

## 2017-02-05 DIAGNOSIS — G4733 Obstructive sleep apnea (adult) (pediatric): Secondary | ICD-10-CM | POA: Diagnosis not present

## 2017-02-05 DIAGNOSIS — I279 Pulmonary heart disease, unspecified: Secondary | ICD-10-CM | POA: Diagnosis not present

## 2017-02-05 DIAGNOSIS — J449 Chronic obstructive pulmonary disease, unspecified: Secondary | ICD-10-CM | POA: Diagnosis not present

## 2017-02-06 DIAGNOSIS — J9621 Acute and chronic respiratory failure with hypoxia: Secondary | ICD-10-CM | POA: Diagnosis not present

## 2017-02-06 DIAGNOSIS — I5032 Chronic diastolic (congestive) heart failure: Secondary | ICD-10-CM | POA: Diagnosis not present

## 2017-02-06 DIAGNOSIS — Z7952 Long term (current) use of systemic steroids: Secondary | ICD-10-CM | POA: Diagnosis not present

## 2017-02-06 DIAGNOSIS — G629 Polyneuropathy, unspecified: Secondary | ICD-10-CM | POA: Diagnosis not present

## 2017-02-06 DIAGNOSIS — Z9981 Dependence on supplemental oxygen: Secondary | ICD-10-CM | POA: Diagnosis not present

## 2017-02-06 DIAGNOSIS — Z792 Long term (current) use of antibiotics: Secondary | ICD-10-CM | POA: Diagnosis not present

## 2017-02-06 DIAGNOSIS — I110 Hypertensive heart disease with heart failure: Secondary | ICD-10-CM | POA: Diagnosis not present

## 2017-02-06 DIAGNOSIS — J189 Pneumonia, unspecified organism: Secondary | ICD-10-CM | POA: Diagnosis not present

## 2017-02-06 DIAGNOSIS — J439 Emphysema, unspecified: Secondary | ICD-10-CM | POA: Diagnosis not present

## 2017-02-08 DIAGNOSIS — Z792 Long term (current) use of antibiotics: Secondary | ICD-10-CM | POA: Diagnosis not present

## 2017-02-08 DIAGNOSIS — Z7952 Long term (current) use of systemic steroids: Secondary | ICD-10-CM | POA: Diagnosis not present

## 2017-02-08 DIAGNOSIS — J189 Pneumonia, unspecified organism: Secondary | ICD-10-CM | POA: Diagnosis not present

## 2017-02-08 DIAGNOSIS — I5032 Chronic diastolic (congestive) heart failure: Secondary | ICD-10-CM | POA: Diagnosis not present

## 2017-02-08 DIAGNOSIS — J9621 Acute and chronic respiratory failure with hypoxia: Secondary | ICD-10-CM | POA: Diagnosis not present

## 2017-02-08 DIAGNOSIS — G629 Polyneuropathy, unspecified: Secondary | ICD-10-CM | POA: Diagnosis not present

## 2017-02-08 DIAGNOSIS — J439 Emphysema, unspecified: Secondary | ICD-10-CM | POA: Diagnosis not present

## 2017-02-08 DIAGNOSIS — I110 Hypertensive heart disease with heart failure: Secondary | ICD-10-CM | POA: Diagnosis not present

## 2017-02-08 DIAGNOSIS — Z9981 Dependence on supplemental oxygen: Secondary | ICD-10-CM | POA: Diagnosis not present

## 2017-02-13 ENCOUNTER — Ambulatory Visit: Payer: Self-pay | Admitting: Pharmacist

## 2017-02-13 DIAGNOSIS — J439 Emphysema, unspecified: Secondary | ICD-10-CM | POA: Diagnosis not present

## 2017-02-13 DIAGNOSIS — I110 Hypertensive heart disease with heart failure: Secondary | ICD-10-CM | POA: Diagnosis not present

## 2017-02-13 DIAGNOSIS — G629 Polyneuropathy, unspecified: Secondary | ICD-10-CM | POA: Diagnosis not present

## 2017-02-13 DIAGNOSIS — Z792 Long term (current) use of antibiotics: Secondary | ICD-10-CM | POA: Diagnosis not present

## 2017-02-13 DIAGNOSIS — J9621 Acute and chronic respiratory failure with hypoxia: Secondary | ICD-10-CM | POA: Diagnosis not present

## 2017-02-13 DIAGNOSIS — I5032 Chronic diastolic (congestive) heart failure: Secondary | ICD-10-CM | POA: Diagnosis not present

## 2017-02-13 DIAGNOSIS — Z7952 Long term (current) use of systemic steroids: Secondary | ICD-10-CM | POA: Diagnosis not present

## 2017-02-13 DIAGNOSIS — Z9981 Dependence on supplemental oxygen: Secondary | ICD-10-CM | POA: Diagnosis not present

## 2017-02-13 DIAGNOSIS — J189 Pneumonia, unspecified organism: Secondary | ICD-10-CM | POA: Diagnosis not present

## 2017-02-14 ENCOUNTER — Other Ambulatory Visit: Payer: Self-pay | Admitting: Pharmacist

## 2017-02-14 DIAGNOSIS — I5032 Chronic diastolic (congestive) heart failure: Secondary | ICD-10-CM | POA: Diagnosis not present

## 2017-02-14 DIAGNOSIS — Z7952 Long term (current) use of systemic steroids: Secondary | ICD-10-CM | POA: Diagnosis not present

## 2017-02-14 DIAGNOSIS — Z9981 Dependence on supplemental oxygen: Secondary | ICD-10-CM | POA: Diagnosis not present

## 2017-02-14 DIAGNOSIS — Z792 Long term (current) use of antibiotics: Secondary | ICD-10-CM | POA: Diagnosis not present

## 2017-02-14 DIAGNOSIS — J439 Emphysema, unspecified: Secondary | ICD-10-CM | POA: Diagnosis not present

## 2017-02-14 DIAGNOSIS — J9621 Acute and chronic respiratory failure with hypoxia: Secondary | ICD-10-CM | POA: Diagnosis not present

## 2017-02-14 DIAGNOSIS — G629 Polyneuropathy, unspecified: Secondary | ICD-10-CM | POA: Diagnosis not present

## 2017-02-14 DIAGNOSIS — I110 Hypertensive heart disease with heart failure: Secondary | ICD-10-CM | POA: Diagnosis not present

## 2017-02-14 DIAGNOSIS — J189 Pneumonia, unspecified organism: Secondary | ICD-10-CM | POA: Diagnosis not present

## 2017-02-14 NOTE — Patient Outreach (Signed)
[name] Care Management  02/14/2017  Hershal CoriaDennis R Lindfors 01-Jul-1956 [id number]  Unsuccessful phone outreach to patient.  HIPAA compliant message left requesting return call.    Plan:  If no return call, will make another outreach attempt next week.   Tommye StandardKevin Latosha Gaylord, PharmD, [facility] Clinical Pharmacist [name] [phone]

## 2017-02-15 DIAGNOSIS — Z792 Long term (current) use of antibiotics: Secondary | ICD-10-CM | POA: Diagnosis not present

## 2017-02-15 DIAGNOSIS — G629 Polyneuropathy, unspecified: Secondary | ICD-10-CM | POA: Diagnosis not present

## 2017-02-15 DIAGNOSIS — J9621 Acute and chronic respiratory failure with hypoxia: Secondary | ICD-10-CM | POA: Diagnosis not present

## 2017-02-15 DIAGNOSIS — Z7952 Long term (current) use of systemic steroids: Secondary | ICD-10-CM | POA: Diagnosis not present

## 2017-02-15 DIAGNOSIS — Z9981 Dependence on supplemental oxygen: Secondary | ICD-10-CM | POA: Diagnosis not present

## 2017-02-15 DIAGNOSIS — I5032 Chronic diastolic (congestive) heart failure: Secondary | ICD-10-CM | POA: Diagnosis not present

## 2017-02-15 DIAGNOSIS — J439 Emphysema, unspecified: Secondary | ICD-10-CM | POA: Diagnosis not present

## 2017-02-15 DIAGNOSIS — I110 Hypertensive heart disease with heart failure: Secondary | ICD-10-CM | POA: Diagnosis not present

## 2017-02-15 DIAGNOSIS — J189 Pneumonia, unspecified organism: Secondary | ICD-10-CM | POA: Diagnosis not present

## 2017-02-16 ENCOUNTER — Other Ambulatory Visit: Payer: Self-pay | Admitting: Pharmacist

## 2017-02-16 DIAGNOSIS — J9621 Acute and chronic respiratory failure with hypoxia: Secondary | ICD-10-CM | POA: Diagnosis not present

## 2017-02-16 DIAGNOSIS — Z9981 Dependence on supplemental oxygen: Secondary | ICD-10-CM | POA: Diagnosis not present

## 2017-02-16 DIAGNOSIS — Z792 Long term (current) use of antibiotics: Secondary | ICD-10-CM | POA: Diagnosis not present

## 2017-02-16 DIAGNOSIS — Z7952 Long term (current) use of systemic steroids: Secondary | ICD-10-CM | POA: Diagnosis not present

## 2017-02-16 DIAGNOSIS — G629 Polyneuropathy, unspecified: Secondary | ICD-10-CM | POA: Diagnosis not present

## 2017-02-16 DIAGNOSIS — I110 Hypertensive heart disease with heart failure: Secondary | ICD-10-CM | POA: Diagnosis not present

## 2017-02-16 DIAGNOSIS — J189 Pneumonia, unspecified organism: Secondary | ICD-10-CM | POA: Diagnosis not present

## 2017-02-16 DIAGNOSIS — J439 Emphysema, unspecified: Secondary | ICD-10-CM | POA: Diagnosis not present

## 2017-02-16 DIAGNOSIS — J449 Chronic obstructive pulmonary disease, unspecified: Secondary | ICD-10-CM | POA: Diagnosis not present

## 2017-02-16 DIAGNOSIS — I5032 Chronic diastolic (congestive) heart failure: Secondary | ICD-10-CM | POA: Diagnosis not present

## 2017-02-16 NOTE — Patient Outreach (Signed)
[facility] Care Management  [name]   02/16/2017  Dustin CoriaDennis R [name] Jan 26, 1956 [id number]  Subjective:  Follow-up phone call to patient to follow-up on [employer] patient assistance application and review medications following his [facility]/29/17 discharge [facility] for [facility].    Patient answered initial call and reported he was working with PT---asked patient to call back later.    Patient returned call.  Patient reports he completed and mailed appeals form back to [employer] about 2 weeks ago.  He was willing to review his medications over the phone.    Objective:   Current Medications: Current Outpatient Prescriptions  Medication Sig Dispense Refill  . Albuterol Sulfate (PROAIR RESPICLICK) 108 (90 BASE) MCG/ACT AEPB Inhale 2 puffs into the lungs every 4 (four) hours as needed (for shortness of breath). Reported on 12/10/2015    . ALPRAZolam (XANAX) 1 MG tablet Take 1 tablet (1 mg total) by mouth 3 (three) times daily as needed for anxiety. 15 tablet 0  . Cholecalciferol (VITAMIN D) 2000 units CAPS Take 2,000 Units by mouth daily.    Marland Kitchen. diltiazem (CARDIZEM) 30 MG tablet Take 1 tablet (30 mg total) by mouth 3 (three) times daily as needed. 90 tablet 6  . escitalopram (LEXAPRO) 20 MG tablet Take 20 mg by mouth at bedtime.     . furosemide (LASIX) 20 MG tablet Take 20 mg by mouth daily.     Marland Kitchen. gabapentin (NEURONTIN) 600 MG tablet Take 300 mg by mouth 2 (two) times daily.     Marland Kitchen. ipratropium-albuterol (DUONEB) 0.5-2.5 (3) MG/3ML SOLN Take 3 mLs by nebulization 4 (four) times daily as needed (for shortness of breath).    . potassium chloride (K-DUR,KLOR-CON) 10 MEQ tablet Take 10 mEq by mouth once.    . Probiotic Product (PROBIOTIC-10) CAPS Take 10 capsules by mouth daily.    Marland Kitchen. rOPINIRole (REQUIP) 0.25 MG tablet Take 1 tablet by mouth 2 (two) times daily.  3  . theophylline (UNIPHYL) 400 MG 24 hr tablet Take 400 mg by mouth 2 (two)  times daily.    . Tiotropium Bromide Monohydrate (SPIRIVA RESPIMAT) 2.5 MCG/ACT AERS Inhale 2 puffs into the lungs daily.     . mometasone-formoterol (DULERA) 200-5 MCG/ACT AERO Inhale 1 puff into the lungs 2 (two) times daily.     . nicotine (NICODERM CQ - DOSED IN MG/24 HOURS) 14 mg/24hr patch Place 1 patch (14 mg total) onto the skin daily. (Patient not taking: Reported on 02/16/2017) 28 patch 0  . predniSONE (DELTASONE) 10 MG tablet Take 1 tablet (10 mg total) by mouth daily with breakfast. 60 mg PO (ORAL) x 2 days 50 mg PO (ORAL)  x 2 days 40 mg PO (ORAL)  x 2 days 30 mg PO  (ORAL)  x 2 days 20 mg PO  (ORAL) x 2 days 10 mg PO  (ORAL) x 2 days then stop (Patient not taking: Reported on 02/16/2017) 42 tablet 0   No current facility-administered medications for this visit.     Functional Status: In your present state of health, do you have any difficulty performing the following activities: 01/22/2017 06/28/2016  Hearing? N -  Vision? N -  Difficulty concentrating or making decisions? N -  Walking or climbing stairs? Y -  Dressing or bathing? N -  Doing errands, shopping? N N  Some recent data might be hidden    Fall/Depression Screening: Fall Risk  04/12/2016 03/29/2016 03/02/2016  Falls  in the past year? Yes Yes Yes  Number falls in past yr: 1 1 1   Injury with Fall? No No No  Risk for fall due to : History of fall(s);Impaired balance/gait;Impaired mobility;Medication side effect History of fall(s);Impaired balance/gait;Medication side effect History of fall(s);Impaired balance/gait;Medication side effect  Risk for fall due to (comments): - - -  Follow up Falls prevention discussed Falls prevention discussed Falls prevention discussed   PHQ 2/9 Scores 04/12/2016 03/29/2016 03/02/2016 02/02/2016 12/13/2015  PHQ - 2 Score 2 2 2 2 2   PHQ- 9 Score 9 7 8 8 8     Assessment:  Medication review per patient report and comparing medication list in this chart and 01/29/17 discharge medication list.   Patient was recently discharged from hospital and all medications have been reviewed.  Drugs sorted by system:  Neurologic/Psychologic: -alprazolam  -escitalopram  -gabapentin---patient reports he is using 600 mg tab and taking 300 mg daily  -ropinirole  Cardiovascular: -diltiazem---patient reports he typically takes at least once daily  -furosemide  Pulmonary/Allergy: -albuterol inhaler as needed---reports he uses on a daily basis -ipratropium/albuterol nebs -Dulera---patient reports he was given a sample of Symbicort to use secondary to cost -theophylline---patient reports he is using once daily  -Spiriva   Vitamins/Minerals: -vitamin D  -potassium chloride   Infectious Diseases: -amoxicillin/clavulanate---was discharged on a course---patient reports he has completed   Miscellaneous: -nicotine patch---patient reports not using secondary to cost.  -prednisone---patient was discharged on a taper which he reports he completed -probiotic   Dose discrepancies: -Gabapentin---discharge medication list has dose of 300 mg twice daily, patient reports taking 300 mg once daily---please clarify dose -Theophylline---discharge medication list has dose of 400 mg (24hr) twice daily, patient reports taking once daily---24 hr tab is typically dosed once daily, not twice daily---please clarify dose   Patient assistance: Call placed to [employer] patient assistance program---representative reports completed application was received and approved for [facility] on 02/13/17 and sent to the dispensing pharmacy on 02/15/17---representative reports it may take 7-10 business days for prescription to ship to patient.    Patient was called back and updated on the above regarding his patient assistance application for [facility] patient assistance.    Plan:  Will route note to PCP regarding medication discrepancies.    Will follow-up with patient in next 2 weeks to see if he received his Dulera from [employer]  patient assistance program.   [name], PharmD, [facility] Clinical Pharmacist [facility] [employer] [phone]

## 2017-02-19 DIAGNOSIS — J189 Pneumonia, unspecified organism: Secondary | ICD-10-CM | POA: Diagnosis not present

## 2017-02-19 DIAGNOSIS — Z9981 Dependence on supplemental oxygen: Secondary | ICD-10-CM | POA: Diagnosis not present

## 2017-02-19 DIAGNOSIS — Z792 Long term (current) use of antibiotics: Secondary | ICD-10-CM | POA: Diagnosis not present

## 2017-02-19 DIAGNOSIS — I5032 Chronic diastolic (congestive) heart failure: Secondary | ICD-10-CM | POA: Diagnosis not present

## 2017-02-19 DIAGNOSIS — J439 Emphysema, unspecified: Secondary | ICD-10-CM | POA: Diagnosis not present

## 2017-02-19 DIAGNOSIS — G629 Polyneuropathy, unspecified: Secondary | ICD-10-CM | POA: Diagnosis not present

## 2017-02-19 DIAGNOSIS — Z7952 Long term (current) use of systemic steroids: Secondary | ICD-10-CM | POA: Diagnosis not present

## 2017-02-19 DIAGNOSIS — J9621 Acute and chronic respiratory failure with hypoxia: Secondary | ICD-10-CM | POA: Diagnosis not present

## 2017-02-19 DIAGNOSIS — I110 Hypertensive heart disease with heart failure: Secondary | ICD-10-CM | POA: Diagnosis not present

## 2017-02-20 DIAGNOSIS — G629 Polyneuropathy, unspecified: Secondary | ICD-10-CM | POA: Diagnosis not present

## 2017-02-20 DIAGNOSIS — J189 Pneumonia, unspecified organism: Secondary | ICD-10-CM | POA: Diagnosis not present

## 2017-02-20 DIAGNOSIS — J9621 Acute and chronic respiratory failure with hypoxia: Secondary | ICD-10-CM | POA: Diagnosis not present

## 2017-02-20 DIAGNOSIS — J439 Emphysema, unspecified: Secondary | ICD-10-CM | POA: Diagnosis not present

## 2017-02-20 DIAGNOSIS — Z9981 Dependence on supplemental oxygen: Secondary | ICD-10-CM | POA: Diagnosis not present

## 2017-02-20 DIAGNOSIS — Z7952 Long term (current) use of systemic steroids: Secondary | ICD-10-CM | POA: Diagnosis not present

## 2017-02-20 DIAGNOSIS — I5032 Chronic diastolic (congestive) heart failure: Secondary | ICD-10-CM | POA: Diagnosis not present

## 2017-02-20 DIAGNOSIS — Z792 Long term (current) use of antibiotics: Secondary | ICD-10-CM | POA: Diagnosis not present

## 2017-02-20 DIAGNOSIS — I110 Hypertensive heart disease with heart failure: Secondary | ICD-10-CM | POA: Diagnosis not present

## 2017-02-21 NOTE — Progress Notes (Signed)
Cardiology Office Note  Date:  02/22/2017   ID:  Dustin [name], DOB 1956/01/12, MRN [id number]  PCP:  Lyndon Code, MD   Chief Complaint  Patient presents with  . other    6 month follow up. Meds reviewed by the pt. verbally. Pt. c/o shortness of breath with some LE edema and still has coughing; was at [facility] with pneumonia.     HPI:   Dustin [name] is a 61 y.o. maleWith long history of  smoking at least 30 years, quit September 2017, emphysema,  acute on chronic diastolic CHF,  Obstructive sleep apnea, Wears CPAP Stress test 05/2016: no ischemia On CT scan, minimial if any CAD, PAD. Minimal aortic arch atherosclerosis who presents to the office for follow-up of his shortness of breath, COPD Exacerbation  Admission 01/2017 COPD exacerbation, PNA Hospital records reviewed with the patient in detail One week in hospital  Still little SOB, Doing home PT,  Feels 70% of normal No PND, orthopnea, edema. No abdominal pain.  Rare smoking Sometimes uses vapor cigarette   on chronic nasal cannula oxygen, still with some coughing chronically elevated Heart rate Previously treated with diltiazem but this was held for low pressure  EKG personally reviewed by myself on todays visit Shows sinus tachycardia with rate 109 bpm right bundle branch block  Other past medical history reviewed CT scan chest /ABD reviewed with him in the office today:  Images pulled up minimial if any CAD, PAD. Minimal aortic arch atherosclerosis  Hospital admission June 22 2016  discharge October 10 for COPD exacerbation with hypoxia. Notes indicating no significant CHF Discharge with antibiotics, prednisone taper  PMH:   has a past medical history of Allergy; Anxiety; Anxiety; Asthma; Chronic diastolic CHF (congestive heart failure) (HCC); Chronic respiratory failure (HCC); COPD (chronic obstructive pulmonary disease) (HCC); Depression; Depression; Depression; Emphysema of lung (HCC); GERD  (gastroesophageal reflux disease); Hypertension; Personal history of tobacco use, presenting hazards to health (08/17/2015); and Tobacco abuse.  PSH:    Past Surgical History:  Procedure Laterality Date  . ABDOMINAL SURGERY    . ADENOIDECTOMY    . hemorrh    . NOSE SURGERY    . URETHRA SURGERY     surgery 6 times from age [age] yrs old    Current Outpatient Prescriptions  Medication Sig Dispense Refill  . Albuterol Sulfate (PROAIR RESPICLICK) 108 (90 BASE) MCG/ACT AEPB Inhale 2 puffs into the lungs every 4 (four) hours as needed (for shortness of breath). Reported on 12/10/2015    . ALPRAZolam (XANAX) 1 MG tablet Take 1 tablet (1 mg total) by mouth 3 (three) times daily as needed for anxiety. 15 tablet 0  . Cholecalciferol (VITAMIN D) 2000 units CAPS Take 2,000 Units by mouth daily.    Marland Kitchen diltiazem (CARDIZEM) 30 MG tablet Take 1 tablet (30 mg total) by mouth 3 (three) times daily as needed. 90 tablet 6  . escitalopram (LEXAPRO) 20 MG tablet Take 20 mg by mouth at bedtime.     . furosemide (LASIX) 20 MG tablet Take 20 mg by mouth daily.     Marland Kitchen gabapentin (NEURONTIN) 600 MG tablet Take 300 mg by mouth 2 (two) times daily.     Marland Kitchen ipratropium-albuterol (DUONEB) 0.5-2.5 (3) MG/3ML SOLN Take 3 mLs by nebulization 4 (four) times daily as needed (for shortness of breath).    . mometasone-formoterol (DULERA) 200-5 MCG/ACT AERO Inhale 1 puff into the lungs 2 (two) times daily.     . potassium chloride (  K-DUR,KLOR-CON) 10 MEQ tablet Take 10 mEq by mouth once.    . Probiotic Product (PROBIOTIC-10) CAPS Take 10 capsules by mouth daily.    Marland Kitchen. rOPINIRole (REQUIP) 0.25 MG tablet Take 1 tablet by mouth 2 (two) times daily.  3  . theophylline (UNIPHYL) 400 MG 24 hr tablet Take 400 mg by mouth 2 (two) times daily.    . Tiotropium Bromide Monohydrate (SPIRIVA RESPIMAT) 2.5 MCG/ACT AERS Inhale 2 puffs into the lungs daily.      No current facility-administered medications for this visit.      Allergies:   Asa  [aspirin]; Bee venom; Codeine; and Ibuprofen   Social History:  The patient  reports that he has been smoking E-cigarettes.  He has a 4.30 pack-year smoking history. He has never used smokeless tobacco. He reports that he does not drink alcohol or use drugs.   Family History:   family history includes CAD in his father; Hypertension in his mother; Peripheral Artery Disease in his mother.    Review of Systems: Review of Systems  Constitutional: Negative.   Respiratory: Positive for cough and shortness of breath.   Cardiovascular: Negative.   Gastrointestinal: Negative.   Musculoskeletal: Negative.   Neurological: Negative.   Psychiatric/Behavioral: Negative.   All other systems reviewed and are negative.    PHYSICAL EXAM: VS:  BP 110/64 (BP Location: Right Arm, Patient Position: Sitting, Cuff Size: Normal)   Pulse (!) 109   Ht 5\' 10"  (1.778 m)   Wt 217 lb 8 oz (98.7 kg)   BMI 31.21 kg/m  , BMI Body mass index is 31.21 kg/m.  GEN: Well nourished, well developed, in no acute distress , on nasal cannula oxygen HEENT: normal  Neck: no JVD, carotid bruits, or masses Cardiac: RRR; no murmurs, rubs, or gallops,no edema  Respiratory:  Decreased breath sounds throughout bilaterally,  normal work of breathing GI: soft, nontender, nondistended, + BS MS: no deformity or atrophy  Skin: warm and dry, no rash Neuro:  Strength and sensation are intact Psych: euthymic mood, full affect    Recent Labs: 06/01/2016: TSH 1.580 10/03/2016: Magnesium 2.0 01/22/2017: ALT 26; B Natriuretic Peptide 200.0 01/26/2017: Hemoglobin 12.1; Platelets 291 01/29/2017: BUN 26; Creatinine, Ser 1.07; Potassium 4.6; Sodium 139    Lipid Panel Lab Results  Component Value Date   CHOL 133 06/01/2016   HDL 59 06/01/2016   LDLCALC 61 06/01/2016   TRIG 64 06/01/2016      Wt Readings from Last 3 Encounters:  02/22/17 217 lb 8 oz (98.7 kg)  01/22/17 217 lb 11.2 oz (98.7 kg)  08/01/16 218 lb 4 oz (99 kg)       ASSESSMENT AND PLAN:   Essential hypertension - Plan: EKG 12-Lead Blood pressure is well controlled on today's visit. No changes made to the medications.  SOB (shortness of breath) - Plan: EKG 12-Lead Severe COPD on chronic oxygen Recent COPD exacerbation, admission to the hospital May 2018 Slow to recover, cough, legs are weak Smoking occasionally On several inhalers, oxygen negative stress test CT scan chest with minimal coronary calcifications noted  Chronic diastolic heart failure (HCC)  on Lasix daily with potassium  COPD exacerbation [facility](HCC) hospital admission with treatment October 2017 occurring antibiotics and steroids Current admission May 2018  Acute on chronic respiratory failure with hypercapnia (HCC) Stable on 2 L nasal cannula continuous Several inhalers  Tobacco use disorder We have encouraged him to continue to work on weaning his cigarettes and smoking cessation. He will continue  to work on this and does not want any assistance with chantix.    Total encounter time more than 25 minutes  Greater than 50% was spent in counseling and coordination of care with the patient   Disposition:   F/U  12 months   Orders Placed This Encounter  Procedures  . EKG 12-Lead     Signed, Dossie Arbour, M.D., Ph.D. 02/22/2017  [facility] [place], [place] [phone]

## 2017-02-22 ENCOUNTER — Ambulatory Visit (INDEPENDENT_AMBULATORY_CARE_PROVIDER_SITE_OTHER): Payer: Medicare Other | Admitting: Cardiovascular Disease

## 2017-02-22 ENCOUNTER — Encounter: Payer: Self-pay | Admitting: Cardiovascular Disease

## 2017-02-22 VITALS — BP 110/64 | HR 109 | Ht 70.0 in | Wt 217.5 lb

## 2017-02-22 DIAGNOSIS — F17200 Nicotine dependence, unspecified, uncomplicated: Secondary | ICD-10-CM | POA: Diagnosis not present

## 2017-02-22 DIAGNOSIS — J441 Chronic obstructive pulmonary disease with (acute) exacerbation: Secondary | ICD-10-CM | POA: Diagnosis not present

## 2017-02-22 DIAGNOSIS — I10 Essential (primary) hypertension: Secondary | ICD-10-CM | POA: Diagnosis not present

## 2017-02-22 DIAGNOSIS — I5033 Acute on chronic diastolic (congestive) heart failure: Secondary | ICD-10-CM

## 2017-02-22 NOTE — Patient Instructions (Signed)

## 2017-02-23 DIAGNOSIS — I5032 Chronic diastolic (congestive) heart failure: Secondary | ICD-10-CM | POA: Diagnosis not present

## 2017-02-23 DIAGNOSIS — J189 Pneumonia, unspecified organism: Secondary | ICD-10-CM | POA: Diagnosis not present

## 2017-02-23 DIAGNOSIS — Z7952 Long term (current) use of systemic steroids: Secondary | ICD-10-CM | POA: Diagnosis not present

## 2017-02-23 DIAGNOSIS — I110 Hypertensive heart disease with heart failure: Secondary | ICD-10-CM | POA: Diagnosis not present

## 2017-02-23 DIAGNOSIS — Z9981 Dependence on supplemental oxygen: Secondary | ICD-10-CM | POA: Diagnosis not present

## 2017-02-23 DIAGNOSIS — J439 Emphysema, unspecified: Secondary | ICD-10-CM | POA: Diagnosis not present

## 2017-02-23 DIAGNOSIS — G629 Polyneuropathy, unspecified: Secondary | ICD-10-CM | POA: Diagnosis not present

## 2017-02-23 DIAGNOSIS — Z792 Long term (current) use of antibiotics: Secondary | ICD-10-CM | POA: Diagnosis not present

## 2017-02-23 DIAGNOSIS — J9621 Acute and chronic respiratory failure with hypoxia: Secondary | ICD-10-CM | POA: Diagnosis not present

## 2017-02-27 DIAGNOSIS — J189 Pneumonia, unspecified organism: Secondary | ICD-10-CM | POA: Diagnosis not present

## 2017-02-27 DIAGNOSIS — I110 Hypertensive heart disease with heart failure: Secondary | ICD-10-CM | POA: Diagnosis not present

## 2017-02-27 DIAGNOSIS — Z7952 Long term (current) use of systemic steroids: Secondary | ICD-10-CM | POA: Diagnosis not present

## 2017-02-27 DIAGNOSIS — J9621 Acute and chronic respiratory failure with hypoxia: Secondary | ICD-10-CM | POA: Diagnosis not present

## 2017-02-27 DIAGNOSIS — G629 Polyneuropathy, unspecified: Secondary | ICD-10-CM | POA: Diagnosis not present

## 2017-02-27 DIAGNOSIS — Z9981 Dependence on supplemental oxygen: Secondary | ICD-10-CM | POA: Diagnosis not present

## 2017-02-27 DIAGNOSIS — I5032 Chronic diastolic (congestive) heart failure: Secondary | ICD-10-CM | POA: Diagnosis not present

## 2017-02-27 DIAGNOSIS — J439 Emphysema, unspecified: Secondary | ICD-10-CM | POA: Diagnosis not present

## 2017-02-27 DIAGNOSIS — Z792 Long term (current) use of antibiotics: Secondary | ICD-10-CM | POA: Diagnosis not present

## 2017-02-28 ENCOUNTER — Ambulatory Visit: Payer: Medicare Other | Admitting: Pharmacist

## 2017-02-28 ENCOUNTER — Other Ambulatory Visit: Payer: Self-pay | Admitting: Pharmacist

## 2017-02-28 DIAGNOSIS — Z792 Long term (current) use of antibiotics: Secondary | ICD-10-CM | POA: Diagnosis not present

## 2017-02-28 DIAGNOSIS — G629 Polyneuropathy, unspecified: Secondary | ICD-10-CM | POA: Diagnosis not present

## 2017-02-28 DIAGNOSIS — Z7952 Long term (current) use of systemic steroids: Secondary | ICD-10-CM | POA: Diagnosis not present

## 2017-02-28 DIAGNOSIS — J189 Pneumonia, unspecified organism: Secondary | ICD-10-CM | POA: Diagnosis not present

## 2017-02-28 DIAGNOSIS — J439 Emphysema, unspecified: Secondary | ICD-10-CM | POA: Diagnosis not present

## 2017-02-28 DIAGNOSIS — G4733 Obstructive sleep apnea (adult) (pediatric): Secondary | ICD-10-CM | POA: Diagnosis not present

## 2017-02-28 DIAGNOSIS — I110 Hypertensive heart disease with heart failure: Secondary | ICD-10-CM | POA: Diagnosis not present

## 2017-02-28 DIAGNOSIS — I5032 Chronic diastolic (congestive) heart failure: Secondary | ICD-10-CM | POA: Diagnosis not present

## 2017-02-28 DIAGNOSIS — J9621 Acute and chronic respiratory failure with hypoxia: Secondary | ICD-10-CM | POA: Diagnosis not present

## 2017-02-28 DIAGNOSIS — Z9981 Dependence on supplemental oxygen: Secondary | ICD-10-CM | POA: Diagnosis not present

## 2017-02-28 NOTE — Patient Outreach (Signed)
[name]) Care Management  02/28/2017  Hershal CoriaDennis R Nou 05-Jan-1956 [id number]  Unsuccessful phone outreach to patient to follow-up if he received his Dulera from [employer] patient assistance yet.    No answer, HIPAA compliant message left requesting return call.   Plan:  If no return call, will make another outreach attempt next week.   1441:   Patient returned call.  He states he has not received Dulera shipment yet.  Patient reports he wonders if there is a difference in strength between Spiriva Handihaler and Spiriva Respimat.  Counseled patient difference in strength on prescription is more due to difference in drug delivery such as an inhaled powder versus inhaled spray.  He verbalized understanding.   Placed call to [employer] Patient Assistance to follow-up on his [place] shipment.  Was transferred to [facility], the [employer] dispensing pharmacy, and representative reports for some reason medication was not filled.  She reports she will try to have patient's order marked urgent and sent via next day air, hopefully with an arrival date of 03/02/17.    Called patient and updated him.  He verbalized understanding.  He was counseled to call [facility] Pharmac[facility] if he does not receive his Dulera.    Plan:  Continue to follow-up with patient to ensure he receives his patient assistance.   Tommye StandardKevin Arihaan Bellucci, PharmD, [facility]ACP Clinical Pharmacist [name] [phone]

## 2017-03-01 ENCOUNTER — Other Ambulatory Visit: Payer: Self-pay | Admitting: Pharmacist

## 2017-03-01 DIAGNOSIS — Z792 Long term (current) use of antibiotics: Secondary | ICD-10-CM | POA: Diagnosis not present

## 2017-03-01 DIAGNOSIS — J189 Pneumonia, unspecified organism: Secondary | ICD-10-CM | POA: Diagnosis not present

## 2017-03-01 DIAGNOSIS — I5032 Chronic diastolic (congestive) heart failure: Secondary | ICD-10-CM | POA: Diagnosis not present

## 2017-03-01 DIAGNOSIS — G629 Polyneuropathy, unspecified: Secondary | ICD-10-CM | POA: Diagnosis not present

## 2017-03-01 DIAGNOSIS — Z7952 Long term (current) use of systemic steroids: Secondary | ICD-10-CM | POA: Diagnosis not present

## 2017-03-01 DIAGNOSIS — Z9981 Dependence on supplemental oxygen: Secondary | ICD-10-CM | POA: Diagnosis not present

## 2017-03-01 DIAGNOSIS — J439 Emphysema, unspecified: Secondary | ICD-10-CM | POA: Diagnosis not present

## 2017-03-01 DIAGNOSIS — J9621 Acute and chronic respiratory failure with hypoxia: Secondary | ICD-10-CM | POA: Diagnosis not present

## 2017-03-01 DIAGNOSIS — I110 Hypertensive heart disease with heart failure: Secondary | ICD-10-CM | POA: Diagnosis not present

## 2017-03-01 NOTE — Patient Outreach (Signed)
[name]) Care Management  03/01/2017  Hershal CoriaDennis R Eddings Jul 31, 1956 [id number]  Incoming call from patient.  He reports he received Dulera from [employer] patient assistance program today.   He reports he only received one inhaler and he has already called Tatiana at prescriber's office to follow-up on getting refills/additional inhalers from [employer] patient assistance program.   At this time, patient denies other pharmacy related needs as he has received manufacturer patient assistance for Spiriva and Dulera.   Plan:  Pharmacy case closed.    Will route note to PCP to make aware of pharmacy case closure.   Patient has [facility] Pharmac[facility] phone number should new issues arise for him.    Tommye StandardKevin Baillie Mohammad, PharmD, [facility] CenterBCACP Clinical Pharmacist [name] [phone]

## 2017-03-02 DIAGNOSIS — G629 Polyneuropathy, unspecified: Secondary | ICD-10-CM | POA: Diagnosis not present

## 2017-03-02 DIAGNOSIS — I5032 Chronic diastolic (congestive) heart failure: Secondary | ICD-10-CM | POA: Diagnosis not present

## 2017-03-02 DIAGNOSIS — I110 Hypertensive heart disease with heart failure: Secondary | ICD-10-CM | POA: Diagnosis not present

## 2017-03-02 DIAGNOSIS — J439 Emphysema, unspecified: Secondary | ICD-10-CM | POA: Diagnosis not present

## 2017-03-02 DIAGNOSIS — J9621 Acute and chronic respiratory failure with hypoxia: Secondary | ICD-10-CM | POA: Diagnosis not present

## 2017-03-02 DIAGNOSIS — Z9981 Dependence on supplemental oxygen: Secondary | ICD-10-CM | POA: Diagnosis not present

## 2017-03-02 DIAGNOSIS — J189 Pneumonia, unspecified organism: Secondary | ICD-10-CM | POA: Diagnosis not present

## 2017-03-02 DIAGNOSIS — Z7952 Long term (current) use of systemic steroids: Secondary | ICD-10-CM | POA: Diagnosis not present

## 2017-03-02 DIAGNOSIS — Z792 Long term (current) use of antibiotics: Secondary | ICD-10-CM | POA: Diagnosis not present

## 2017-03-05 ENCOUNTER — Ambulatory Visit: Payer: Medicare Other | Admitting: Pharmacist

## 2017-03-09 DIAGNOSIS — G629 Polyneuropathy, unspecified: Secondary | ICD-10-CM | POA: Diagnosis not present

## 2017-03-09 DIAGNOSIS — Z792 Long term (current) use of antibiotics: Secondary | ICD-10-CM | POA: Diagnosis not present

## 2017-03-09 DIAGNOSIS — J189 Pneumonia, unspecified organism: Secondary | ICD-10-CM | POA: Diagnosis not present

## 2017-03-09 DIAGNOSIS — Z9981 Dependence on supplemental oxygen: Secondary | ICD-10-CM | POA: Diagnosis not present

## 2017-03-09 DIAGNOSIS — J439 Emphysema, unspecified: Secondary | ICD-10-CM | POA: Diagnosis not present

## 2017-03-09 DIAGNOSIS — Z7952 Long term (current) use of systemic steroids: Secondary | ICD-10-CM | POA: Diagnosis not present

## 2017-03-09 DIAGNOSIS — J9621 Acute and chronic respiratory failure with hypoxia: Secondary | ICD-10-CM | POA: Diagnosis not present

## 2017-03-09 DIAGNOSIS — I110 Hypertensive heart disease with heart failure: Secondary | ICD-10-CM | POA: Diagnosis not present

## 2017-03-09 DIAGNOSIS — I5032 Chronic diastolic (congestive) heart failure: Secondary | ICD-10-CM | POA: Diagnosis not present

## 2017-03-12 DIAGNOSIS — Z7952 Long term (current) use of systemic steroids: Secondary | ICD-10-CM | POA: Diagnosis not present

## 2017-03-12 DIAGNOSIS — J189 Pneumonia, unspecified organism: Secondary | ICD-10-CM | POA: Diagnosis not present

## 2017-03-12 DIAGNOSIS — J9621 Acute and chronic respiratory failure with hypoxia: Secondary | ICD-10-CM | POA: Diagnosis not present

## 2017-03-12 DIAGNOSIS — G629 Polyneuropathy, unspecified: Secondary | ICD-10-CM | POA: Diagnosis not present

## 2017-03-12 DIAGNOSIS — J439 Emphysema, unspecified: Secondary | ICD-10-CM | POA: Diagnosis not present

## 2017-03-12 DIAGNOSIS — Z9981 Dependence on supplemental oxygen: Secondary | ICD-10-CM | POA: Diagnosis not present

## 2017-03-12 DIAGNOSIS — Z792 Long term (current) use of antibiotics: Secondary | ICD-10-CM | POA: Diagnosis not present

## 2017-03-12 DIAGNOSIS — I5032 Chronic diastolic (congestive) heart failure: Secondary | ICD-10-CM | POA: Diagnosis not present

## 2017-03-12 DIAGNOSIS — I110 Hypertensive heart disease with heart failure: Secondary | ICD-10-CM | POA: Diagnosis not present

## 2017-03-14 DIAGNOSIS — J189 Pneumonia, unspecified organism: Secondary | ICD-10-CM | POA: Diagnosis not present

## 2017-03-14 DIAGNOSIS — I110 Hypertensive heart disease with heart failure: Secondary | ICD-10-CM | POA: Diagnosis not present

## 2017-03-14 DIAGNOSIS — Z792 Long term (current) use of antibiotics: Secondary | ICD-10-CM | POA: Diagnosis not present

## 2017-03-14 DIAGNOSIS — J439 Emphysema, unspecified: Secondary | ICD-10-CM | POA: Diagnosis not present

## 2017-03-14 DIAGNOSIS — Z7952 Long term (current) use of systemic steroids: Secondary | ICD-10-CM | POA: Diagnosis not present

## 2017-03-14 DIAGNOSIS — G629 Polyneuropathy, unspecified: Secondary | ICD-10-CM | POA: Diagnosis not present

## 2017-03-14 DIAGNOSIS — J9621 Acute and chronic respiratory failure with hypoxia: Secondary | ICD-10-CM | POA: Diagnosis not present

## 2017-03-14 DIAGNOSIS — I5032 Chronic diastolic (congestive) heart failure: Secondary | ICD-10-CM | POA: Diagnosis not present

## 2017-03-14 DIAGNOSIS — Z9981 Dependence on supplemental oxygen: Secondary | ICD-10-CM | POA: Diagnosis not present

## 2017-03-15 DIAGNOSIS — Z792 Long term (current) use of antibiotics: Secondary | ICD-10-CM | POA: Diagnosis not present

## 2017-03-15 DIAGNOSIS — Z7952 Long term (current) use of systemic steroids: Secondary | ICD-10-CM | POA: Diagnosis not present

## 2017-03-15 DIAGNOSIS — J439 Emphysema, unspecified: Secondary | ICD-10-CM | POA: Diagnosis not present

## 2017-03-15 DIAGNOSIS — I5032 Chronic diastolic (congestive) heart failure: Secondary | ICD-10-CM | POA: Diagnosis not present

## 2017-03-15 DIAGNOSIS — Z9981 Dependence on supplemental oxygen: Secondary | ICD-10-CM | POA: Diagnosis not present

## 2017-03-15 DIAGNOSIS — J189 Pneumonia, unspecified organism: Secondary | ICD-10-CM | POA: Diagnosis not present

## 2017-03-15 DIAGNOSIS — J9621 Acute and chronic respiratory failure with hypoxia: Secondary | ICD-10-CM | POA: Diagnosis not present

## 2017-03-15 DIAGNOSIS — I110 Hypertensive heart disease with heart failure: Secondary | ICD-10-CM | POA: Diagnosis not present

## 2017-03-15 DIAGNOSIS — G629 Polyneuropathy, unspecified: Secondary | ICD-10-CM | POA: Diagnosis not present

## 2017-03-18 DIAGNOSIS — J449 Chronic obstructive pulmonary disease, unspecified: Secondary | ICD-10-CM | POA: Diagnosis not present

## 2017-03-19 DIAGNOSIS — Z7952 Long term (current) use of systemic steroids: Secondary | ICD-10-CM | POA: Diagnosis not present

## 2017-03-19 DIAGNOSIS — J439 Emphysema, unspecified: Secondary | ICD-10-CM | POA: Diagnosis not present

## 2017-03-19 DIAGNOSIS — Z9981 Dependence on supplemental oxygen: Secondary | ICD-10-CM | POA: Diagnosis not present

## 2017-03-19 DIAGNOSIS — G629 Polyneuropathy, unspecified: Secondary | ICD-10-CM | POA: Diagnosis not present

## 2017-03-19 DIAGNOSIS — J9621 Acute and chronic respiratory failure with hypoxia: Secondary | ICD-10-CM | POA: Diagnosis not present

## 2017-03-19 DIAGNOSIS — Z792 Long term (current) use of antibiotics: Secondary | ICD-10-CM | POA: Diagnosis not present

## 2017-03-19 DIAGNOSIS — I5032 Chronic diastolic (congestive) heart failure: Secondary | ICD-10-CM | POA: Diagnosis not present

## 2017-03-19 DIAGNOSIS — I110 Hypertensive heart disease with heart failure: Secondary | ICD-10-CM | POA: Diagnosis not present

## 2017-03-19 DIAGNOSIS — J189 Pneumonia, unspecified organism: Secondary | ICD-10-CM | POA: Diagnosis not present

## 2017-03-20 DIAGNOSIS — R000 Tachycardia, unspecified: Secondary | ICD-10-CM | POA: Diagnosis not present

## 2017-03-20 DIAGNOSIS — J449 Chronic obstructive pulmonary disease, unspecified: Secondary | ICD-10-CM | POA: Diagnosis not present

## 2017-03-21 DIAGNOSIS — Z9981 Dependence on supplemental oxygen: Secondary | ICD-10-CM | POA: Diagnosis not present

## 2017-03-21 DIAGNOSIS — J439 Emphysema, unspecified: Secondary | ICD-10-CM | POA: Diagnosis not present

## 2017-03-21 DIAGNOSIS — Z7952 Long term (current) use of systemic steroids: Secondary | ICD-10-CM | POA: Diagnosis not present

## 2017-03-21 DIAGNOSIS — J189 Pneumonia, unspecified organism: Secondary | ICD-10-CM | POA: Diagnosis not present

## 2017-03-21 DIAGNOSIS — Z792 Long term (current) use of antibiotics: Secondary | ICD-10-CM | POA: Diagnosis not present

## 2017-03-21 DIAGNOSIS — J9621 Acute and chronic respiratory failure with hypoxia: Secondary | ICD-10-CM | POA: Diagnosis not present

## 2017-03-21 DIAGNOSIS — I110 Hypertensive heart disease with heart failure: Secondary | ICD-10-CM | POA: Diagnosis not present

## 2017-03-21 DIAGNOSIS — G629 Polyneuropathy, unspecified: Secondary | ICD-10-CM | POA: Diagnosis not present

## 2017-03-21 DIAGNOSIS — I5032 Chronic diastolic (congestive) heart failure: Secondary | ICD-10-CM | POA: Diagnosis not present

## 2017-03-22 DIAGNOSIS — J9621 Acute and chronic respiratory failure with hypoxia: Secondary | ICD-10-CM | POA: Diagnosis not present

## 2017-03-22 DIAGNOSIS — G629 Polyneuropathy, unspecified: Secondary | ICD-10-CM | POA: Diagnosis not present

## 2017-03-22 DIAGNOSIS — I5032 Chronic diastolic (congestive) heart failure: Secondary | ICD-10-CM | POA: Diagnosis not present

## 2017-03-22 DIAGNOSIS — J189 Pneumonia, unspecified organism: Secondary | ICD-10-CM | POA: Diagnosis not present

## 2017-03-22 DIAGNOSIS — I110 Hypertensive heart disease with heart failure: Secondary | ICD-10-CM | POA: Diagnosis not present

## 2017-03-22 DIAGNOSIS — Z9981 Dependence on supplemental oxygen: Secondary | ICD-10-CM | POA: Diagnosis not present

## 2017-03-22 DIAGNOSIS — J439 Emphysema, unspecified: Secondary | ICD-10-CM | POA: Diagnosis not present

## 2017-03-22 DIAGNOSIS — Z7952 Long term (current) use of systemic steroids: Secondary | ICD-10-CM | POA: Diagnosis not present

## 2017-03-22 DIAGNOSIS — Z792 Long term (current) use of antibiotics: Secondary | ICD-10-CM | POA: Diagnosis not present

## 2017-03-23 ENCOUNTER — Other Ambulatory Visit: Payer: Self-pay | Admitting: Pharmacist

## 2017-03-23 NOTE — Patient Outreach (Signed)
[name]) Care Management  03/23/2017  Hershal CoriaDennis R Kunz 01/05/1956 [id number]  Incoming call from patient.  He is familiar to [facility] Pharmac[facility] from previous patient assistance needs.   Patient is asking what gabapentin is used for.  Discussed with patient it is an anti-seizure medication that is also used for neuropathy sometimes.  He reports he believes his MD is using gabapentin for neuropathy for him.    He asked if gabapentin can cause cramping---discussed this is not a side effect typically associated with gabapentin.    He asked if theophylline can cause a tremor.  Discussed tremor is a side effect listed to theophylline but it would be difficult for [facility] Pharmacist to determine if he is experiencing tremor from this medication.   Advised patient to contact his PCP to discuss his medication concerns.   He denied other pharmacy concerns and states he appreciated the information.  Plan:  Pharmacy episode closed.   Will route this note to PCP.    Tommye StandardKevin Freeman Borba, PharmD, [facility] CenterBCACP Clinical Pharmacist [name] [phone]

## 2017-03-26 DIAGNOSIS — I5032 Chronic diastolic (congestive) heart failure: Secondary | ICD-10-CM | POA: Diagnosis not present

## 2017-03-26 DIAGNOSIS — I110 Hypertensive heart disease with heart failure: Secondary | ICD-10-CM | POA: Diagnosis not present

## 2017-03-26 DIAGNOSIS — J9621 Acute and chronic respiratory failure with hypoxia: Secondary | ICD-10-CM | POA: Diagnosis not present

## 2017-03-26 DIAGNOSIS — J189 Pneumonia, unspecified organism: Secondary | ICD-10-CM | POA: Diagnosis not present

## 2017-03-26 DIAGNOSIS — Z7952 Long term (current) use of systemic steroids: Secondary | ICD-10-CM | POA: Diagnosis not present

## 2017-03-26 DIAGNOSIS — Z792 Long term (current) use of antibiotics: Secondary | ICD-10-CM | POA: Diagnosis not present

## 2017-03-26 DIAGNOSIS — Z9981 Dependence on supplemental oxygen: Secondary | ICD-10-CM | POA: Diagnosis not present

## 2017-03-26 DIAGNOSIS — J439 Emphysema, unspecified: Secondary | ICD-10-CM | POA: Diagnosis not present

## 2017-03-26 DIAGNOSIS — G629 Polyneuropathy, unspecified: Secondary | ICD-10-CM | POA: Diagnosis not present

## 2017-03-28 DIAGNOSIS — Z792 Long term (current) use of antibiotics: Secondary | ICD-10-CM | POA: Diagnosis not present

## 2017-03-28 DIAGNOSIS — J9621 Acute and chronic respiratory failure with hypoxia: Secondary | ICD-10-CM | POA: Diagnosis not present

## 2017-03-28 DIAGNOSIS — I110 Hypertensive heart disease with heart failure: Secondary | ICD-10-CM | POA: Diagnosis not present

## 2017-03-28 DIAGNOSIS — J189 Pneumonia, unspecified organism: Secondary | ICD-10-CM | POA: Diagnosis not present

## 2017-03-28 DIAGNOSIS — Z9981 Dependence on supplemental oxygen: Secondary | ICD-10-CM | POA: Diagnosis not present

## 2017-03-28 DIAGNOSIS — I5032 Chronic diastolic (congestive) heart failure: Secondary | ICD-10-CM | POA: Diagnosis not present

## 2017-03-28 DIAGNOSIS — Z7952 Long term (current) use of systemic steroids: Secondary | ICD-10-CM | POA: Diagnosis not present

## 2017-03-28 DIAGNOSIS — J439 Emphysema, unspecified: Secondary | ICD-10-CM | POA: Diagnosis not present

## 2017-03-28 DIAGNOSIS — G629 Polyneuropathy, unspecified: Secondary | ICD-10-CM | POA: Diagnosis not present

## 2017-03-30 DIAGNOSIS — G4733 Obstructive sleep apnea (adult) (pediatric): Secondary | ICD-10-CM | POA: Diagnosis not present

## 2017-04-18 DIAGNOSIS — J449 Chronic obstructive pulmonary disease, unspecified: Secondary | ICD-10-CM | POA: Diagnosis not present

## 2017-04-23 ENCOUNTER — Other Ambulatory Visit: Payer: Self-pay | Admitting: Internal Medicine

## 2017-04-23 ENCOUNTER — Other Ambulatory Visit (HOSPITAL_COMMUNITY): Payer: Self-pay | Admitting: Internal Medicine

## 2017-04-23 ENCOUNTER — Telehealth: Payer: Self-pay | Admitting: *Deleted

## 2017-04-23 DIAGNOSIS — R911 Solitary pulmonary nodule: Secondary | ICD-10-CM

## 2017-04-23 DIAGNOSIS — R9389 Abnormal findings on diagnostic imaging of other specified body structures: Secondary | ICD-10-CM

## 2017-04-23 NOTE — Telephone Encounter (Signed)
Notified patient that lung cancer screening low dose CT scan follow up is due currently or will be in near future. Confirmed that patient is within the age range of 55-77, and asymptomatic, (no signs or symptoms of lung cancer). Patient denies illness that would prevent curative treatment for lung cancer if found. Verified smoking history, (former, quit March 2017, 64.5 pack year ). The shared decision making visit was done 08/18/15. Patient is agreeable for CT scan being scheduled.

## 2017-04-30 DIAGNOSIS — G4733 Obstructive sleep apnea (adult) (pediatric): Secondary | ICD-10-CM | POA: Diagnosis not present

## 2017-05-03 ENCOUNTER — Ambulatory Visit: Payer: Medicare Other

## 2017-05-09 ENCOUNTER — Other Ambulatory Visit
Admission: RE | Admit: 2017-05-09 | Discharge: 2017-05-09 | Disposition: A | Discharge status: Home / Self Care | Payer: Medicare Other | Source: Ambulatory Visit | Attending: Family Medicine | Admitting: Family Medicine

## 2017-05-09 DIAGNOSIS — J441 Chronic obstructive pulmonary disease with (acute) exacerbation: Secondary | ICD-10-CM | POA: Insufficient documentation

## 2017-05-09 DIAGNOSIS — J9621 Acute and chronic respiratory failure with hypoxia: Secondary | ICD-10-CM | POA: Insufficient documentation

## 2017-05-09 LAB — EXPECTORATED SPUTUM ASSESSMENT W REFEX TO RESP CULTURE

## 2017-05-09 LAB — EXPECTORATED SPUTUM ASSESSMENT W GRAM STAIN, RFLX TO RESP C

## 2017-05-11 ENCOUNTER — Other Ambulatory Visit
Admission: RE | Admit: 2017-05-11 | Discharge: 2017-05-11 | Disposition: A | Discharge status: Home / Self Care | Payer: Medicare Other | Source: Ambulatory Visit | Attending: Family Medicine | Admitting: Family Medicine

## 2017-05-11 DIAGNOSIS — J449 Chronic obstructive pulmonary disease, unspecified: Secondary | ICD-10-CM | POA: Diagnosis not present

## 2017-05-11 LAB — EXPECTORATED SPUTUM ASSESSMENT W REFEX TO RESP CULTURE

## 2017-05-14 LAB — CULTURE, RESPIRATORY W GRAM STAIN
Culture: NORMAL
Gram Stain: NONE SEEN

## 2017-05-14 LAB — CULTURE, RESPIRATORY

## 2017-05-19 DIAGNOSIS — J449 Chronic obstructive pulmonary disease, unspecified: Secondary | ICD-10-CM | POA: Diagnosis not present

## 2017-05-31 DIAGNOSIS — G4733 Obstructive sleep apnea (adult) (pediatric): Secondary | ICD-10-CM | POA: Diagnosis not present

## 2017-06-11 ENCOUNTER — Ambulatory Visit (HOSPITAL_COMMUNITY): Payer: Medicare Other

## 2017-06-11 ENCOUNTER — Ambulatory Visit
Admission: RE | Admit: 2017-06-11 | Discharge: 2017-06-11 | Disposition: A | Discharge status: Home / Self Care | Payer: Medicare Other | Source: Ambulatory Visit | Attending: Internal Medicine | Admitting: Internal Medicine

## 2017-06-11 DIAGNOSIS — J432 Centrilobular emphysema: Secondary | ICD-10-CM | POA: Diagnosis not present

## 2017-06-11 DIAGNOSIS — R911 Solitary pulmonary nodule: Secondary | ICD-10-CM | POA: Diagnosis not present

## 2017-06-11 DIAGNOSIS — R918 Other nonspecific abnormal finding of lung field: Secondary | ICD-10-CM | POA: Diagnosis not present

## 2017-06-11 LAB — POCT I-STAT CREATININE: CREATININE: 1.1 mg/dL (ref 0.61–1.24)

## 2017-06-11 MED ORDER — IOPAMIDOL (ISOVUE-300) INJECTION 61%
75.0000 mL | Freq: Once | INTRAVENOUS | Status: AC | PRN
  Administered 2017-06-11: 75 mL via INTRAVENOUS

## 2017-06-18 DIAGNOSIS — J449 Chronic obstructive pulmonary disease, unspecified: Secondary | ICD-10-CM | POA: Diagnosis not present

## 2017-06-28 ENCOUNTER — Inpatient Hospital Stay
Admission: EM | Admit: 2017-06-28 | Discharge: 2017-07-03 | DRG: 190 | Disposition: A | Discharge status: Home Health | Payer: Medicare Other | Attending: Internal Medicine | Admitting: Internal Medicine

## 2017-06-28 ENCOUNTER — Emergency Department: Payer: Medicare Other

## 2017-06-28 ENCOUNTER — Encounter: Payer: Self-pay | Admitting: Emergency Medicine

## 2017-06-28 DIAGNOSIS — Z7951 Long term (current) use of inhaled steroids: Secondary | ICD-10-CM

## 2017-06-28 DIAGNOSIS — J9601 Acute respiratory failure with hypoxia: Secondary | ICD-10-CM | POA: Diagnosis not present

## 2017-06-28 DIAGNOSIS — J9621 Acute and chronic respiratory failure with hypoxia: Secondary | ICD-10-CM | POA: Diagnosis present

## 2017-06-28 DIAGNOSIS — I5032 Chronic diastolic (congestive) heart failure: Secondary | ICD-10-CM | POA: Diagnosis present

## 2017-06-28 DIAGNOSIS — J209 Acute bronchitis, unspecified: Secondary | ICD-10-CM | POA: Diagnosis present

## 2017-06-28 DIAGNOSIS — F329 Major depressive disorder, single episode, unspecified: Secondary | ICD-10-CM | POA: Diagnosis present

## 2017-06-28 DIAGNOSIS — Z23 Encounter for immunization: Secondary | ICD-10-CM

## 2017-06-28 DIAGNOSIS — F17210 Nicotine dependence, cigarettes, uncomplicated: Secondary | ICD-10-CM | POA: Diagnosis present

## 2017-06-28 DIAGNOSIS — Z8249 Family history of ischemic heart disease and other diseases of the circulatory system: Secondary | ICD-10-CM | POA: Diagnosis not present

## 2017-06-28 DIAGNOSIS — R0602 Shortness of breath: Secondary | ICD-10-CM | POA: Diagnosis not present

## 2017-06-28 DIAGNOSIS — Z9981 Dependence on supplemental oxygen: Secondary | ICD-10-CM

## 2017-06-28 DIAGNOSIS — Z91030 Bee allergy status: Secondary | ICD-10-CM | POA: Diagnosis not present

## 2017-06-28 DIAGNOSIS — Z6831 Body mass index (BMI) 31.0-31.9, adult: Secondary | ICD-10-CM | POA: Diagnosis not present

## 2017-06-28 DIAGNOSIS — Z885 Allergy status to narcotic agent status: Secondary | ICD-10-CM

## 2017-06-28 DIAGNOSIS — Z720 Tobacco use: Secondary | ICD-10-CM | POA: Diagnosis not present

## 2017-06-28 DIAGNOSIS — J440 Chronic obstructive pulmonary disease with acute lower respiratory infection: Secondary | ICD-10-CM | POA: Diagnosis present

## 2017-06-28 DIAGNOSIS — Z79899 Other long term (current) drug therapy: Secondary | ICD-10-CM

## 2017-06-28 DIAGNOSIS — R000 Tachycardia, unspecified: Secondary | ICD-10-CM | POA: Diagnosis not present

## 2017-06-28 DIAGNOSIS — R0600 Dyspnea, unspecified: Secondary | ICD-10-CM | POA: Diagnosis not present

## 2017-06-28 DIAGNOSIS — I10 Essential (primary) hypertension: Secondary | ICD-10-CM | POA: Diagnosis not present

## 2017-06-28 DIAGNOSIS — J9620 Acute and chronic respiratory failure, unspecified whether with hypoxia or hypercapnia: Secondary | ICD-10-CM | POA: Diagnosis present

## 2017-06-28 DIAGNOSIS — K219 Gastro-esophageal reflux disease without esophagitis: Secondary | ICD-10-CM | POA: Diagnosis not present

## 2017-06-28 DIAGNOSIS — I110 Hypertensive heart disease with heart failure: Secondary | ICD-10-CM | POA: Diagnosis present

## 2017-06-28 DIAGNOSIS — R0902 Hypoxemia: Secondary | ICD-10-CM | POA: Diagnosis not present

## 2017-06-28 DIAGNOSIS — Z886 Allergy status to analgesic agent status: Secondary | ICD-10-CM | POA: Diagnosis not present

## 2017-06-28 DIAGNOSIS — G4733 Obstructive sleep apnea (adult) (pediatric): Secondary | ICD-10-CM | POA: Diagnosis not present

## 2017-06-28 DIAGNOSIS — F419 Anxiety disorder, unspecified: Secondary | ICD-10-CM | POA: Diagnosis present

## 2017-06-28 DIAGNOSIS — J441 Chronic obstructive pulmonary disease with (acute) exacerbation: Principal | ICD-10-CM | POA: Diagnosis present

## 2017-06-28 LAB — BRAIN NATRIURETIC PEPTIDE: B NATRIURETIC PEPTIDE 5: 68 pg/mL (ref 0.0–100.0)

## 2017-06-28 LAB — CBC WITH DIFFERENTIAL/PLATELET
Basophils Absolute: 0.1 10*3/uL (ref 0–0.1)
Basophils Relative: 0 %
Eosinophils Absolute: 0.1 10*3/uL (ref 0–0.7)
Eosinophils Relative: 1 %
HEMATOCRIT: 41 % (ref 40.0–52.0)
HEMOGLOBIN: 13.3 g/dL (ref 13.0–18.0)
LYMPHS ABS: 1.3 10*3/uL (ref 1.0–3.6)
LYMPHS PCT: 9 %
MCH: 31.3 pg (ref 26.0–34.0)
MCHC: 32.5 g/dL (ref 32.0–36.0)
MCV: 96.4 fL (ref 80.0–100.0)
MONOS PCT: 6 %
Monocytes Absolute: 0.9 10*3/uL (ref 0.2–1.0)
NEUTROS PCT: 84 %
Neutro Abs: 12.4 10*3/uL — ABNORMAL HIGH (ref 1.4–6.5)
Platelets: 162 10*3/uL (ref 150–440)
RBC: 4.25 MIL/uL — ABNORMAL LOW (ref 4.40–5.90)
RDW: 13.9 % (ref 11.5–14.5)
WBC: 14.8 10*3/uL — ABNORMAL HIGH (ref 3.8–10.6)

## 2017-06-28 LAB — COMPREHENSIVE METABOLIC PANEL
ALBUMIN: 3.8 g/dL (ref 3.5–5.0)
ALK PHOS: 59 U/L (ref 38–126)
ALT: 14 U/L — AB (ref 17–63)
AST: 20 U/L (ref 15–41)
Anion gap: 8 (ref 5–15)
BILIRUBIN TOTAL: 0.7 mg/dL (ref 0.3–1.2)
BUN: 17 mg/dL (ref 6–20)
CO2: 33 mmol/L — ABNORMAL HIGH (ref 22–32)
Calcium: 9.1 mg/dL (ref 8.9–10.3)
Chloride: 99 mmol/L — ABNORMAL LOW (ref 101–111)
Creatinine, Ser: 1.15 mg/dL (ref 0.61–1.24)
GFR calc Af Amer: >60 mL/min (ref >60)
GLUCOSE: 106 mg/dL — AB (ref 65–99)
POTASSIUM: 4.1 mmol/L (ref 3.5–5.1)
Sodium: 140 mmol/L (ref 135–145)
Total Protein: 6.8 g/dL (ref 6.5–8.1)

## 2017-06-28 LAB — TROPONIN I: Troponin I: <0.03 ng/mL (ref <0.03)

## 2017-06-28 MED ORDER — SODIUM CHLORIDE 0.9 % IV SOLN: 250.0000 mL | INTRAVENOUS | Status: DC | PRN

## 2017-06-28 MED ORDER — GUAIFENESIN ER 600 MG PO TB12
600.0000 mg | ORAL_TABLET | Freq: Two times a day (BID) | ORAL | Status: DC
  Administered 2017-06-28 – 2017-07-03 (×10): 600 mg via ORAL
  Filled 2017-06-28 (×10): qty 1

## 2017-06-28 MED ORDER — METHYLPREDNISOLONE SODIUM SUCC 125 MG IJ SOLR
60.0000 mg | Freq: Four times a day (QID) | INTRAMUSCULAR | Status: DC
  Administered 2017-06-29 (×3): 60 mg via INTRAVENOUS
  Filled 2017-06-28 (×3): qty 2

## 2017-06-28 MED ORDER — IPRATROPIUM-ALBUTEROL 0.5-2.5 (3) MG/3ML IN SOLN
RESPIRATORY_TRACT | Status: AC
  Administered 2017-06-28: 3 mL via RESPIRATORY_TRACT
  Filled 2017-06-28: qty 3

## 2017-06-28 MED ORDER — GABAPENTIN 600 MG PO TABS
300.0000 mg | ORAL_TABLET | Freq: Two times a day (BID) | ORAL | Status: DC
  Administered 2017-06-29 – 2017-06-30 (×4): 300 mg via ORAL
  Filled 2017-06-28 (×5): qty 0.5

## 2017-06-28 MED ORDER — AZITHROMYCIN 500 MG PO TABS
500.0000 mg | ORAL_TABLET | Freq: Every day | ORAL | Status: DC
  Administered 2017-06-29: 500 mg via ORAL
  Filled 2017-06-28 (×2): qty 1

## 2017-06-28 MED ORDER — SODIUM CHLORIDE 0.9% FLUSH: 3.0000 mL | INTRAVENOUS | Status: DC | PRN

## 2017-06-28 MED ORDER — ALPRAZOLAM 0.5 MG PO TABS
1.0000 mg | ORAL_TABLET | Freq: Three times a day (TID) | ORAL | Status: DC | PRN
Start: 2017-06-28 — End: 2017-07-03
  Administered 2017-06-29 – 2017-07-03 (×12): 1 mg via ORAL
  Filled 2017-06-28 (×13): qty 2

## 2017-06-28 MED ORDER — ACETAMINOPHEN 325 MG PO TABS: 650.0000 mg | ORAL_TABLET | Freq: Four times a day (QID) | ORAL | Status: DC | PRN

## 2017-06-28 MED ORDER — IPRATROPIUM-ALBUTEROL 0.5-2.5 (3) MG/3ML IN SOLN
3.0000 mL | Freq: Four times a day (QID) | RESPIRATORY_TRACT | Status: DC
  Administered 2017-06-28 – 2017-07-01 (×12): 3 mL via RESPIRATORY_TRACT
  Filled 2017-06-28 (×11): qty 3

## 2017-06-28 MED ORDER — ENOXAPARIN SODIUM 40 MG/0.4ML ~~LOC~~ SOLN
40.0000 mg | SUBCUTANEOUS | Status: DC
  Administered 2017-06-29 – 2017-07-03 (×5): 40 mg via SUBCUTANEOUS
  Filled 2017-06-28 (×6): qty 0.4

## 2017-06-28 MED ORDER — BUDESONIDE 0.25 MG/2ML IN SUSP
0.2500 mg | Freq: Two times a day (BID) | RESPIRATORY_TRACT | Status: DC
  Administered 2017-06-29 – 2017-07-03 (×9): 0.25 mg via RESPIRATORY_TRACT
  Filled 2017-06-28 (×9): qty 2

## 2017-06-28 MED ORDER — FUROSEMIDE 40 MG PO TABS
20.0000 mg | ORAL_TABLET | Freq: Every day | ORAL | Status: DC
  Administered 2017-06-29 – 2017-07-02 (×4): 20 mg via ORAL
  Administered 2017-07-03: 40 mg via ORAL
  Filled 2017-06-28 (×5): qty 1

## 2017-06-28 MED ORDER — ONDANSETRON HCL 4 MG PO TABS: 4.0000 mg | ORAL_TABLET | Freq: Four times a day (QID) | ORAL | Status: DC | PRN

## 2017-06-28 MED ORDER — THEOPHYLLINE ER 400 MG PO TB24
400.0000 mg | ORAL_TABLET | Freq: Two times a day (BID) | ORAL | Status: DC
  Filled 2017-06-28 (×2): qty 1

## 2017-06-28 MED ORDER — VITAMIN D 1000 UNITS PO TABS
2000.0000 [IU] | ORAL_TABLET | Freq: Every day | ORAL | Status: DC
  Administered 2017-06-29 – 2017-07-03 (×5): 2000 [IU] via ORAL
  Filled 2017-06-28 (×6): qty 2

## 2017-06-28 MED ORDER — POTASSIUM CHLORIDE CRYS ER 20 MEQ PO TBCR
10.0000 meq | EXTENDED_RELEASE_TABLET | Freq: Once | ORAL | Status: AC
  Administered 2017-06-29: 10 meq via ORAL
  Filled 2017-06-28: qty 1

## 2017-06-28 MED ORDER — ONDANSETRON HCL 4 MG/2ML IJ SOLN: 4.0000 mg | Freq: Four times a day (QID) | INTRAMUSCULAR | Status: DC | PRN

## 2017-06-28 MED ORDER — ACETAMINOPHEN 650 MG RE SUPP
650.0000 mg | Freq: Four times a day (QID) | RECTAL | Status: DC | PRN
Start: 2017-06-28 — End: 2017-07-03

## 2017-06-28 MED ORDER — IPRATROPIUM-ALBUTEROL 0.5-2.5 (3) MG/3ML IN SOLN
3.0000 mL | Freq: Once | RESPIRATORY_TRACT | Status: AC
  Administered 2017-06-28: 3 mL via RESPIRATORY_TRACT
  Filled 2017-06-28: qty 3

## 2017-06-28 MED ORDER — ROPINIROLE HCL 0.25 MG PO TABS
0.2500 mg | ORAL_TABLET | Freq: Two times a day (BID) | ORAL | Status: DC
  Administered 2017-06-29 – 2017-07-03 (×10): 0.25 mg via ORAL
  Filled 2017-06-28 (×11): qty 1

## 2017-06-28 MED ORDER — ESCITALOPRAM OXALATE 10 MG PO TABS
20.0000 mg | ORAL_TABLET | Freq: Every day | ORAL | Status: DC
  Administered 2017-06-29 – 2017-07-02 (×5): 20 mg via ORAL
  Filled 2017-06-28 (×6): qty 2

## 2017-06-28 MED ORDER — RISAQUAD PO CAPS
10.0000 | ORAL_CAPSULE | Freq: Every day | ORAL | Status: DC
  Filled 2017-06-28: qty 10

## 2017-06-28 MED ORDER — METHYLPREDNISOLONE SODIUM SUCC 125 MG IJ SOLR
125.0000 mg | Freq: Once | INTRAMUSCULAR | Status: AC
  Administered 2017-06-28: 125 mg via INTRAVENOUS
  Filled 2017-06-28: qty 2

## 2017-06-28 MED ORDER — SODIUM CHLORIDE 0.9% FLUSH
3.0000 mL | Freq: Two times a day (BID) | INTRAVENOUS | Status: DC
  Administered 2017-06-29 – 2017-07-02 (×7): 3 mL via INTRAVENOUS

## 2017-06-28 MED ORDER — IPRATROPIUM-ALBUTEROL 0.5-2.5 (3) MG/3ML IN SOLN
3.0000 mL | Freq: Once | RESPIRATORY_TRACT | Status: AC
  Administered 2017-06-28: 3 mL via RESPIRATORY_TRACT

## 2017-06-28 MED ORDER — TIOTROPIUM BROMIDE MONOHYDRATE 18 MCG IN CAPS
1.0000 | ORAL_CAPSULE | Freq: Every day | RESPIRATORY_TRACT | Status: DC
Start: 2017-06-29 — End: 2017-07-03
  Administered 2017-06-29 – 2017-07-03 (×5): 18 ug via RESPIRATORY_TRACT
  Filled 2017-06-28: qty 5

## 2017-06-28 NOTE — ED Notes (Signed)
Pt given sandwich tray and ginger ale. 

## 2017-06-28 NOTE — ED Notes (Signed)
Pt unable to tolerate [place] from 2-6 liters. Placed on venturi mask at 45 at 10L

## 2017-06-28 NOTE — ED Notes (Signed)
Patient taken off of bipap by this RN. Dr. Darnelle Catalan in agreement. Patient placed on 3L [place]. Tolerating well, SpO2 99%.

## 2017-06-28 NOTE — ED Notes (Addendum)
PT STATES FULL INFO TO SISTER AND DAUGHTER IN CONTACTS if contact RN.   Password: Pappy

## 2017-06-28 NOTE — ED Notes (Signed)
RT at bedside to place patient on Bipap

## 2017-06-28 NOTE — ED Notes (Signed)
EMS gave 1 nitro tab in route.

## 2017-06-28 NOTE — ED Provider Notes (Signed)
[facility] Emergency Department Provider Note   ____________________________________________   First MD Initiated Contact with Patient 06/28/17 1719     (approximate)  I have reviewed the triage vital signs and the nursing notes.   HISTORY  Chief Complaint Respiratory Distress    [name] is a 61 y.o. male Who complains of shortness of breath. In EMS got there and found him lying in the bed with O2 sats in the 80s. He had to walk to the ambulance because they couldn't get a stretcher in. When he got to the ambulance he was blue. They put him on CPAP. He denies any pain. He has a history of COPD and CHF. He is still quite short of breath. I gave him a DuoNeb and a sublingual nitroglycerin. He did not have chest pain and gave it to him for possible CHF.   Past Medical History:  Diagnosis Date  . Allergy   . Anxiety   . Anxiety   . Asthma   . Chronic diastolic CHF (congestive heart failure) (HCC)    a. echo 07/2013: EF 60-65%, DD, biatrial dilatation, Ao sclerosis, dilated RV, moderate pulmonary HTN, elevated CV and RA pressures; b. patient reported echo at Dr. Milta Deiters office 02/2015 - his office does not have record of him being a pt there c. echo 11/2015: EF 60-65%, Grade 1 DD, mod-severe pulm pressures  . Chronic respiratory failure (HCC)    a. on 2L via nasal cannula; b. secondary to COPD  . COPD (chronic obstructive pulmonary disease) (HCC)   . Depression   . Depression   . Depression   . Emphysema of lung (HCC)   . GERD (gastroesophageal reflux disease)   . Hypertension   . Personal history of tobacco use, presenting hazards to health 08/17/2015  . Tobacco abuse     Patient Active Problem List   Diagnosis Date Noted  . Anxiety 06/22/2016  . Essential hypertension 06/22/2016  . Acute respiratory failure with hypoxia (HCC) 03/25/2016  . Tobacco use disorder 03/25/2016  . Acute on chronic respiratory failure with hypercapnia (HCC)    . Endotracheally intubated   . Chest pain with low risk of acute coronary syndrome 11/30/2015  . Chronic diastolic heart failure (HCC) 11/30/2015  . Sinus tachycardia seen on cardiac monitor 11/30/2015  . Hypercapnic respiratory failure (HCC) 11/30/2015  . Pulmonary hypertension (HCC)   . Centrilobular emphysema (HCC)   . Lethargy   . Personal history of tobacco use, presenting hazards to health 08/17/2015  . Pressure ulcer 04/19/2015  . Acute on chronic diastolic CHF (congestive heart failure) (HCC)   . COPD exacerbation (HCC) 03/31/2015    Past Surgical History:  Procedure Laterality Date  . ABDOMINAL SURGERY    . ADENOIDECTOMY    . hemorrh    . NOSE SURGERY    . URETHRA SURGERY     surgery 6 times from age [age] yrs old    Prior to Admission medications   Medication Sig Start Date End Date Taking? Authorizing Provider  Albuterol Sulfate (PROAIR RESPICLICK) 108 (90 BASE) MCG/ACT AEPB Inhale 2 puffs into the lungs every 4 (four) hours as needed (for shortness of breath). Reported on 12/10/2015    [provider]  ALPRAZolam Prudy Feeler) 1 MG tablet Take 1 tablet (1 mg total) by mouth 3 (three) times daily as needed for anxiety. 12/06/15   Enedina Finner, MD  Cholecalciferol (VITAMIN D) 2000 units CAPS Take 2,000 Units by mouth daily.  [provider]  diltiazem (CARDIZEM) 30 MG tablet Take 1 tablet (30 mg total) by mouth 3 (three) times daily as needed. 08/01/16   Antonieta Iba, MD  escitalopram (LEXAPRO) 20 MG tablet Take 20 mg by mouth at bedtime.     [provider]  furosemide (LASIX) 20 MG tablet Take 20 mg by mouth daily.     [provider]  gabapentin (NEURONTIN) 600 MG tablet Take 300 mg by mouth 2 (two) times daily.     [provider]  ipratropium-albuterol (DUONEB) 0.5-2.5 (3) MG/3ML SOLN Take 3 mLs by nebulization 4 (four) times daily as needed (for shortness of breath).    [provider]  mometasone-formoterol  (DULERA) 200-5 MCG/ACT AERO Inhale 1 puff into the lungs 2 (two) times daily.     [provider]  potassium chloride (K-DUR,KLOR-CON) 10 MEQ tablet Take 10 mEq by mouth once.    [provider]  Probiotic Product (PROBIOTIC-10) CAPS Take 10 capsules by mouth daily.    [provider]  rOPINIRole (REQUIP) 0.25 MG tablet Take 1 tablet by mouth 2 (two) times daily. 01/04/17   [provider]  theophylline (UNIPHYL) 400 MG 24 hr tablet Take 400 mg by mouth 2 (two) times daily.    [provider]  Tiotropium Bromide Monohydrate (SPIRIVA RESPIMAT) 2.5 MCG/ACT AERS Inhale 2 puffs into the lungs daily.     [provider]    Allergies Asa [aspirin]; Bee venom; Codeine; and Ibuprofen  Family History  Problem Relation Age of Onset  . CAD Father   . Hypertension Mother   . Peripheral Artery Disease Mother   . Diabetes Neg Hx     Social History Social History  Substance Use Topics  . Smoking status: Current Every Day Smoker    Packs/day: 0.10    Years: 43.00    Types: E-cigarettes  . Smokeless tobacco: Never Used  . Alcohol use No    Review of Systems  Constitutional: No fever/chills Eyes: No visual changes. ENT: No sore throat. Cardiovascular: Denies chest pain. Respiratory:the history of present illness Gastrointestinal: No abdominal pain.  No nausea, no vomiting.  No diarrhea.  No constipation. Genitourinary: Negative for dysuria. Musculoskeletal: Negative for back pain. Skin: Negative for rash. Neurological: Negative for headaches, focal weakness  ____________________________________________   PHYSICAL EXAM:  VITAL SIGNS: ED Triage Vitals [06/28/17 1715]  Enc Vitals Group     BP      Pulse      Resp      Temp      Temp src      SpO2      Weight 215 lb (97.5 kg)     Height  (1.778 m)     Head Circumference      Peak Flow      Pain Score      Pain Loc      Pain Edu?      Excl. in GC?    Constitutional:  Alert and oriented. Well appearing and in no acute distress. Eyes: Conjunctivae are normal. PERRL. EOMI. Head: Atraumatic. Nose: No congestion/rhinnorhea. Mouth/Throat: Mucous membranes are moist.  Oropharynx non-erythematous. Neck: No stridor. Cardiovascular: Normal rate, regular rhythm. Grossly normal heart sounds.  Good peripheral circulation. Respiratory: Normal respiratory effort.  No retractions. Lungs CTAB. Gastrointestinal: Soft and nontender. No distention. No abdominal bruits. No CVA tenderness. Musculoskeletal: No lower extremity tenderness nor edema.  No joint effusions. Neurologic:  Normal speech and language. No gross  focal neurologic deficits are appreciated. No gait instability. Skin:  Skin is warm, dry and intact. No rash noted. Psychiatric: Mood and affect are normal. Speech and behavior are normal.  ____________________________________________   LABS (all labs ordered are listed, but only abnormal results are displayed)  Labs Reviewed  COMPREHENSIVE METABOLIC PANEL - Abnormal; Notable for the following:       Result Value   Chloride 99 (*)    CO2 33 (*)    Glucose, Bld 106 (*)    ALT 14 (*)    All other components within normal limits  CBC WITH DIFFERENTIAL/PLATELET - Abnormal; Notable for the following:    WBC 14.8 (*)    RBC 4.25 (*)    Neutro Abs 12.4 (*)    All other components within normal limits  BRAIN NATRIURETIC PEPTIDE  TROPONIN I   ____________________________________________  EKG   ____________________________________________  RADIOLOGY radiology shows only hyperinflation most consistent with COPD exacerbation  ____________________________________________   PROCEDURES  Procedure(s) performed:   Procedures  Critical Care performed:   ____________________________________________   INITIAL IMPRESSION / ASSESSMENT AND PLAN / ED COURSE  review of old records shows a history of CHF and COPD. Patient has had 3 DuoNeb's in the  emergency room and site Medrol and he still be sats down to 88 just sitting in his bed. When he does this he is on 3 L oxygen except for instead of his usual 2 we will put him in the hospital COPD exacerbations diagnosisthe NP and troponin are negative      ____________________________________________   FINAL CLINICAL IMPRESSION(S) / ED DIAGNOSES  Final diagnoses:  COPD exacerbation (HCC)      NEW MEDICATIONS STARTED DURING THIS VISIT:  New Prescriptions   No medications on file     Note:  This document was prepared using Dragon voice recognition software and may include unintentional dictation errors.    Arnaldo Natal, MD 06/28/17 [phone]

## 2017-06-28 NOTE — ED Notes (Signed)
RT took patient off of ETCO2.

## 2017-06-28 NOTE — ED Notes (Signed)
This rn called daughter to update on transfer to inpt bed

## 2017-06-28 NOTE — ED Triage Notes (Signed)
Patient presents to ED via [facility] from home with c/o shortness of breath. EMS report SpO2 in the low 80's upon their arrival. EMS on c pap upon arrival to ED. Patient denies pain. A&O x4. Follows commands.

## 2017-06-28 NOTE — H&P (Signed)
[employer] [facility] - [place] at [facility]   PATIENT NAME: Dustin [name]    MR#:  [id number]  DATE OF BIRTH:  1956/03/16  DATE OF ADMISSION:  06/28/2017  PRIMARY CARE PHYSICIAN: Lyndon Code, MD   REQUESTING/REFERRING PHYSICIAN:   CHIEF COMPLAINT:   Chief Complaint  Patient presents with  . Respiratory Distress    HISTORY OF PRESENT ILLNESS: Dustin [name]  is a 61 y.o. male with a known history of  Chronic respiratory failure due to COPD on oxygen therapy, chronic diastolic CHF, depression, GERD, essential hypertension who is presenting with complaint of having shortness of breath. Patient states that over the past few days his shortness of breath has gotten worse. He has had a productive cough of greenish sputum. He also has had low-grade fevers. Patient initially required BiPAP briefly what was able to be taken off BiPAP. His chest x-ray was negative in the emergency room.  PAST MEDICAL HISTORY:   Past Medical History:  Diagnosis Date  . Allergy   . Anxiety   . Anxiety   . Asthma   . Chronic diastolic CHF (congestive heart failure) (HCC)    a. echo 07/2013: EF 60-65%, DD, biatrial dilatation, Ao sclerosis, dilated RV, moderate pulmonary HTN, elevated CV and RA pressures; b. patient reported echo at Dr. Milta Deiters office 02/2015 - his office does not have record of him being a pt there c. echo 11/2015: EF 60-65%, Grade 1 DD, mod-severe pulm pressures  . Chronic respiratory failure (HCC)    a. on 2L via nasal cannula; b. secondary to COPD  . COPD (chronic obstructive pulmonary disease) (HCC)   . Depression   . Depression   . Depression   . Emphysema of lung (HCC)   . GERD (gastroesophageal reflux disease)   . Hypertension   . Personal history of tobacco use, presenting hazards to health 08/17/2015  . Tobacco abuse     PAST SURGICAL HISTORY: Past Surgical History:  Procedure Laterality Date  . ABDOMINAL SURGERY    . ADENOIDECTOMY    . hemorrh    . NOSE SURGERY     . URETHRA SURGERY     surgery 6 times from age [age] yrs old    SOCIAL HISTORY:  Social History  Substance Use Topics  . Smoking status: Current Every Day Smoker    Packs/day: 0.10    Years: 43.00    Types: E-cigarettes  . Smokeless tobacco: Never Used  . Alcohol use No    FAMILY HISTORY:  Family History  Problem Relation Age of Onset  . CAD Father   . Hypertension Mother   . Peripheral Artery Disease Mother   . Diabetes Neg Hx     DRUG ALLERGIES:  Allergies  Allergen Reactions  . Asa [Aspirin] Other (See Comments)    Reaction: swelling of the right side.  . Bee Venom Swelling  . Codeine Other (See Comments)    Reaction: Difficulty breathing  . Ibuprofen Other (See Comments)    Reaction: Swelling of the right side.    REVIEW OF SYSTEMS:   CONSTITUTIONAL: No fever, fatigue or weakness.  EYES: No blurred or double vision.  EARS, NOSE, AND THROAT: No tinnitus or ear pain.  RESPIRATORY: Positive cough, positive shortness of breath, wheezing or hemoptysis.  CARDIOVASCULAR: No chest pain, orthopnea, edema.  GASTROINTESTINAL: No nausea, vomiting, diarrhea or abdominal pain.  GENITOURINARY: No dysuria, hematuria.  ENDOCRINE: No polyuria, nocturia,  HEMATOLOGY: No anemia, easy bruising or bleeding SKIN: No rash or  lesion. MUSCULOSKELETAL: No joint pain or arthritis.   NEUROLOGIC: No tingling, numbness, weakness.  PSYCHIATRY: No anxiety or depression.   MEDICATIONS AT HOME:  Prior to Admission medications   Medication Sig Start Date End Date Taking? Authorizing Provider  Albuterol Sulfate (PROAIR RESPICLICK) 108 (90 BASE) MCG/ACT AEPB Inhale 2 puffs into the lungs every 4 (four) hours as needed (for shortness of breath). Reported on 12/10/2015   Yes [provider]  ALPRAZolam Prudy Feeler) 1 MG tablet Take 1 tablet (1 mg total) by mouth 3 (three) times daily as needed for anxiety. 12/06/15  Yes Enedina Finner, MD  Cholecalciferol (VITAMIN D) 2000 units CAPS Take 2,000  Units by mouth daily.   Yes [provider]  escitalopram (LEXAPRO) 20 MG tablet Take 20 mg by mouth at bedtime.    Yes [provider]  furosemide (LASIX) 20 MG tablet Take 20 mg by mouth daily.    Yes [provider]  gabapentin (NEURONTIN) 300 MG capsule Take 300 mg by mouth 2 (two) times daily.    Yes [provider]  ipratropium-albuterol (DUONEB) 0.5-2.5 (3) MG/3ML SOLN Take 3 mLs by nebulization 4 (four) times daily as needed (for shortness of breath).   Yes [provider]  mometasone-formoterol (DULERA) 200-5 MCG/ACT AERO Inhale 1 puff into the lungs 2 (two) times daily.    Yes [provider]  potassium chloride (K-DUR,KLOR-CON) 10 MEQ tablet Take 10 mEq by mouth once.   Yes [provider]  Probiotic Product (PROBIOTIC-10) CAPS Take 1 capsule by mouth 2 (two) times daily.    Yes [provider]  rOPINIRole (REQUIP) 0.25 MG tablet Take 1 tablet by mouth 2 (two) times daily. 01/04/17  Yes [provider]  theophylline (UNIPHYL) 400 MG 24 hr tablet Take 400 mg by mouth 2 (two) times daily.   Yes [provider]  Tiotropium Bromide Monohydrate (SPIRIVA RESPIMAT) 2.5 MCG/ACT AERS Inhale 2 puffs into the lungs daily.    Yes [provider]  diltiazem (CARDIZEM) 30 MG tablet Take 1 tablet (30 mg total) by mouth 3 (three) times daily as needed. 08/01/16   Antonieta Iba, MD  PROAIR HFA 108 [phone] Base) MCG/ACT inhaler Take 2 puffs by mouth every 6 (six) hours as needed. 05/25/17   [provider]      PHYSICAL EXAMINATION:   VITAL SIGNS: Blood pressure 123/90, pulse (!) 103, temperature (!) 96.7 F (35.9 C), temperature source Axillary, resp. rate 17, height  (1.778 m), weight 215 lb (97.5 kg), SpO2 92 %.  GENERAL:  61 y.o.-year-old patient lying in the bed with no acute distress.  EYES: Pupils equal, round, reactive to light and accommodation. No scleral icterus. Extraocular  muscles intact.  HEENT: Head atraumatic, normocephalic. Oropharynx and nasopharynx clear.  NECK:  Supple, no jugular venous distention. No thyroid enlargement, no tenderness.  LUNGS: Occasional wheezing bilaterally without any necessary muscle usage CARDIOVASCULAR: S1, S2 normal. No murmurs, rubs, or gallops.  ABDOMEN: Soft, nontender, nondistended. Bowel sounds present. No organomegaly or mass.  EXTREMITIES: No pedal edema, cyanosis, or clubbing.  NEUROLOGIC: Cranial nerves II through XII are intact. Muscle strength 5/5 in all extremities. Sensation intact. Gait not checked.  PSYCHIATRIC: The patient is alert and oriented x 3.  SKIN: No obvious rash, lesion, or ulcer.   LABORATORY PANEL:   CBC  Recent Labs Lab 06/28/17 1721  WBC 14.8*  HGB 13.3  HCT 41.0  PLT 162  MCV 96.4  MCH 31.3  MCHC  32.5  RDW 13.9  LYMPHSABS 1.3  MONOABS 0.9  EOSABS 0.1  BASOSABS 0.1   ------------------------------------------------------------------------------------------------------------------  Chemistries   Recent Labs Lab 06/28/17 1721  NA 140  K 4.1  CL 99*  CO2 33*  GLUCOSE 106*  BUN 17  CREATININE 1.15  CALCIUM 9.1  AST 20  ALT 14*  ALKPHOS 59  BILITOT 0.7   ------------------------------------------------------------------------------------------------------------------ estimated creatinine clearance is 77 mL/min (by C-G formula based on SCr of 1.15 mg/dL). ------------------------------------------------------------------------------------------------------------------ No results for input(s): TSH, T4TOTAL, T3FREE, THYROIDAB in the last 72 hours.  Invalid input(s): FREET3   Coagulation profile No results for input(s): INR, PROTIME in the last 168 hours. ------------------------------------------------------------------------------------------------------------------- No results for input(s): DDIMER in the last 72  hours. -------------------------------------------------------------------------------------------------------------------  Cardiac Enzymes  Recent Labs Lab 06/28/17 1721  TROPONINI <0.03   ------------------------------------------------------------------------------------------------------------------ Invalid input(s): POCBNP  ---------------------------------------------------------------------------------------------------------------  Urinalysis    Component Value Date/Time   COLORURINE Yellow 07/23/2014 2110   APPEARANCEUR Hazy 07/23/2014 2110   LABSPEC 1.027 07/23/2014 2110   PHURINE 5.0 07/23/2014 2110   GLUCOSEU Negative 07/23/2014 2110   HGBUR Negative 07/23/2014 2110   BILIRUBINUR Negative 07/23/2014 2110   KETONESUR Negative 07/23/2014 2110   PROTEINUR Negative 07/23/2014 2110   NITRITE Negative 07/23/2014 2110   LEUKOCYTESUR Negative 07/23/2014 2110     RADIOLOGY: Dg Chest Portable 1 View  Result Date: 06/28/2017 CLINICAL DATA:  Dyspnea and hypoxia at home today. EXAM: PORTABLE CHEST 1 VIEW COMPARISON:  01/22/2017 FINDINGS: Moderate hyperinflation and cardiomegaly, unchanged. Severe emphysematous changes are again evident. No consolidation. No large effusion. Normal pulmonary vasculature. Unchanged hilar and mediastinal contours. IMPRESSION: Stable hyperinflation and cardiomegaly. No consolidation or effusion. Electronically Signed   By: Ellery Plunk M.D.   On: 06/28/2017 18:09    EKG: Orders placed or performed during the hospital encounter of 06/28/17  . ED EKG  . ED EKG  . EKG 12-Lead  . EKG 12-Lead    IMPRESSION AND PLAN: Patient is a 61 year old male with history of COPD with worsening shortness of breath  1. Acute on chronic respiratory failure due to acute on chronic COPD exasperation I will treat patient with nebulizers every 6 hours Place patient on IV Solu-Medrol Place him on budesonide nebs Continue inhalers as taking at  home Mucinex I will obtain sputum culture possible Azithromycin for acute bronchitis Continue theophylline  2. Chronic diastolic CHF continue Lasix currently appears compensated  3. History of tachycardia continue Cardizem  4. Anxiety depression continue alprazolam  5. Miscellaneous Lovenox for DVT prophylaxis   All the records are reviewed and case discussed with ED provider. Management plans discussed with the patient, family and they are in agreement.  CODE STATUS: Code Status History    Date Active Date Inactive Code Status Order ID Comments User Context   01/22/2017  9:29 PM 01/29/2017  7:44 PM Full Code [id number]  Houston Siren, MD Inpatient   06/23/2016 12:01 AM 06/28/2016  7:43 PM Full Code [id number]  Oralia Manis, MD Inpatient   03/25/2016  5:03 PM 03/31/2016 11:08 PM Full Code [id number]  Standley Brooking, MD Inpatient   11/30/2015  5:12 AM 12/06/2015  9:34 PM Full Code [id number]  Arnaldo Natal, MD Inpatient   04/13/2015  5:22 PM 04/21/2015  7:40 PM Full Code [id number]  Auburn Bilberry, MD Inpatient   03/31/2015  8:50 PM 04/05/2015  8:48 PM Full Code [id number]  Hower, Cletis [place], MD ED       TOTAL TIME TAKING CARE OF  THIS PATIENT:55 minutes.    Auburn Bilberry M.D on 06/28/2017 at 9:24 PM  Between 7am to 6pm - Pager - [phone]  After 6pm go to [email or web address] - password [email or web address] [facility]  [facility]  Office  [phone]  CC: Primary care physician; Lyndon Code, MD

## 2017-06-29 LAB — CBC
HEMATOCRIT: 40.1 % (ref 40.0–52.0)
HEMOGLOBIN: 13.5 g/dL (ref 13.0–18.0)
MCH: 32.1 pg (ref 26.0–34.0)
MCHC: 33.6 g/dL (ref 32.0–36.0)
MCV: 95.6 fL (ref 80.0–100.0)
Platelets: 155 10*3/uL (ref 150–440)
RBC: 4.2 MIL/uL — AB (ref 4.40–5.90)
RDW: 13.6 % (ref 11.5–14.5)
WBC: 9.1 10*3/uL (ref 3.8–10.6)

## 2017-06-29 LAB — BASIC METABOLIC PANEL
Anion gap: 4 — ABNORMAL LOW (ref 5–15)
BUN: 18 mg/dL (ref 6–20)
CHLORIDE: 105 mmol/L (ref 101–111)
CO2: 33 mmol/L — AB (ref 22–32)
Calcium: 8.9 mg/dL (ref 8.9–10.3)
Creatinine, Ser: 0.87 mg/dL (ref 0.61–1.24)
GFR calc non Af Amer: >60 mL/min (ref >60)
Glucose, Bld: 145 mg/dL — ABNORMAL HIGH (ref 65–99)
POTASSIUM: 4.8 mmol/L (ref 3.5–5.1)
SODIUM: 142 mmol/L (ref 135–145)

## 2017-06-29 MED ORDER — AZITHROMYCIN 500 MG PO TABS
500.0000 mg | ORAL_TABLET | Freq: Every day | ORAL | Status: AC
  Administered 2017-06-30 – 2017-07-01 (×2): 500 mg via ORAL
  Filled 2017-06-29 (×2): qty 1

## 2017-06-29 MED ORDER — RISAQUAD PO CAPS
1.0000 | ORAL_CAPSULE | Freq: Every day | ORAL | Status: DC
  Administered 2017-06-29 – 2017-07-03 (×5): 1 via ORAL
  Filled 2017-06-29 (×5): qty 1

## 2017-06-29 MED ORDER — THEOPHYLLINE ER 200 MG PO TB12
400.0000 mg | ORAL_TABLET | Freq: Two times a day (BID) | ORAL | Status: DC
  Administered 2017-06-29 (×2): 400 mg via ORAL
  Filled 2017-06-29 (×3): qty 2

## 2017-06-29 MED ORDER — METHYLPREDNISOLONE SODIUM SUCC 125 MG IJ SOLR
60.0000 mg | Freq: Two times a day (BID) | INTRAMUSCULAR | Status: DC
  Administered 2017-06-30 – 2017-07-03 (×7): 60 mg via INTRAVENOUS
  Filled 2017-06-29 (×7): qty 2

## 2017-06-29 MED ORDER — THEOPHYLLINE ER 400 MG PO TB24
400.0000 mg | ORAL_TABLET | Freq: Every day | ORAL | Status: DC
  Filled 2017-06-29 (×2): qty 1

## 2017-06-29 NOTE — Progress Notes (Signed)
Placed pt on 6L [place]. Pt tolerating well.

## 2017-06-29 NOTE — Progress Notes (Signed)
Placed patient back on venturi mask due to low sats on nasal cannula. Patient tolerated well. Patient on cellphone talking with family. Saturation increasing. Last check 90%.

## 2017-06-29 NOTE — Progress Notes (Signed)
[facility] [employer] - [place] at [facility] [place]   PATIENT NAME: Dustin [name]    MR#:  [id number]  DATE OF BIRTH:  1956/04/15  SUBJECTIVE:  CHIEF COMPLAINT:   Chief Complaint  Patient presents with  . Respiratory Distress   Better shortness breath and wheezing, on O2 [place] 6L. REVIEW OF SYSTEMS:  Review of Systems  Constitutional: Negative for chills, fever and malaise/fatigue.  HENT: Negative for sore throat.   Eyes: Negative for blurred vision and double vision.  Respiratory: Positive for cough, shortness of breath and wheezing. Negative for hemoptysis and stridor.   Cardiovascular: Negative for chest pain, palpitations, orthopnea and leg swelling.  Gastrointestinal: Negative for abdominal pain, blood in stool, diarrhea, melena, nausea and vomiting.  Genitourinary: Negative for dysuria, flank pain and hematuria.  Musculoskeletal: Negative for back pain and joint pain.  Skin: Negative for rash.  Neurological: Negative for dizziness, sensory change, focal weakness, seizures, loss of consciousness, weakness and headaches.  Endo/Heme/Allergies: Negative for polydipsia.  Psychiatric/Behavioral: Negative for depression. The patient is not nervous/anxious.     DRUG ALLERGIES:   Allergies  Allergen Reactions  . Asa [Aspirin] Other (See Comments)    Reaction: swelling of the right side.  . Bee Venom Swelling  . Codeine Other (See Comments)    Reaction: Difficulty breathing  . Ibuprofen Other (See Comments)    Reaction: Swelling of the right side.   VITALS:  Blood pressure 113/72, pulse (!) 103, temperature 97.9 F (36.6 C), temperature source Oral, resp. rate 16, height  (1.778 m), weight 215 lb (97.5 kg), SpO2 95 %. PHYSICAL EXAMINATION:  Physical Exam  Constitutional: He is oriented to person, place, and time and well-developed, well-nourished, and in no distress.  Morbid obesity.  HENT:  Head: Normocephalic.  Mouth/Throat: Oropharynx is clear and moist.    Eyes: Pupils are equal, round, and reactive to light. Conjunctivae and EOM are normal. No scleral icterus.  Neck: Normal range of motion. Neck supple. No JVD present. No tracheal deviation present.  Cardiovascular: Normal rate, regular rhythm and normal heart sounds.  Exam reveals no gallop.   No murmur heard. Pulmonary/Chest: Effort normal. No respiratory distress. He has wheezes. He has no rales.  Abdominal: Soft. Bowel sounds are normal. He exhibits no distension. There is no tenderness. There is no rebound.  Musculoskeletal: Normal range of motion. He exhibits no edema or tenderness.  Neurological: He is alert and oriented to person, place, and time. No cranial nerve deficit.  Skin: No rash noted. No erythema.  Psychiatric: Affect normal.   LABORATORY PANEL:  Male CBC  Recent Labs Lab 06/29/17 0351  WBC 9.1  HGB 13.5  HCT 40.1  PLT 155   ------------------------------------------------------------------------------------------------------------------ Chemistries   Recent Labs Lab 06/28/17 1721 06/29/17 0351  NA 140 142  K 4.1 4.8  CL 99* 105  CO2 33* 33*  GLUCOSE 106* 145*  BUN 17 18  CREATININE 1.15 0.87  CALCIUM 9.1 8.9  AST 20  --   ALT 14*  --   ALKPHOS 59  --   BILITOT 0.7  --    RADIOLOGY:  Dg Chest Portable 1 View  Result Date: 06/28/2017 CLINICAL DATA:  Dyspnea and hypoxia at home today. EXAM: PORTABLE CHEST 1 VIEW COMPARISON:  01/22/2017 FINDINGS: Moderate hyperinflation and cardiomegaly, unchanged. Severe emphysematous changes are again evident. No consolidation. No large effusion. Normal pulmonary vasculature. Unchanged hilar and mediastinal contours. IMPRESSION: Stable hyperinflation and cardiomegaly. No consolidation or effusion. Electronically Signed  By: Ellery Plunk M.D.   On: 06/28/2017 18:09   ASSESSMENT AND PLAN:   Patient is a 61 year old male with history of COPD with worsening shortness of breath  1. Acute on chronic respiratory  failure With hypoxia due to acute on chronic COPD exasperation Continue nebulizers every 6 hours, taper IV Solu-Medrol, continue budesonide nebs and Mucinex. Azithromycin for acute bronchitis Continue theophylline  2. Chronic diastolic CHF continue Lasix currently stable.  3. History of tachycardia continue Cardizem  4. Anxiety depression continue alprazolam  5. Tobacco abuse. Smoking cessation was counseled for 5 minutes. He said he will quit.  Morbid obesity.  All the records are reviewed and case discussed with Care Management/Social Worker. Management plans discussed with the patient, family and they are in agreement.  CODE STATUS: Full Code  TOTAL TIME TAKING CARE OF THIS PATIENT: 39 minutes.   More than 50% of the time was spent in counseling/coordination of care: YES  POSSIBLE D/C IN 2-3 DAYS, DEPENDING ON CLINICAL CONDITION.   Shaune Pollack M.D on 06/29/2017 at 3:01 PM  Between 7am to 6pm - Pager - [phone]  After 6pm go to [email or web address] - Therapist, nutritional Hospitalists

## 2017-06-30 LAB — EXPECTORATED SPUTUM ASSESSMENT W GRAM STAIN, RFLX TO RESP C

## 2017-06-30 LAB — EXPECTORATED SPUTUM ASSESSMENT W REFEX TO RESP CULTURE

## 2017-06-30 MED ORDER — THEOPHYLLINE ER 400 MG PO CP24
400.0000 mg | ORAL_CAPSULE | Freq: Every day | ORAL | Status: DC
  Administered 2017-06-30 – 2017-07-03 (×4): 400 mg via ORAL
  Filled 2017-06-30 (×4): qty 1

## 2017-06-30 MED ORDER — GABAPENTIN 300 MG PO CAPS
300.0000 mg | ORAL_CAPSULE | Freq: Two times a day (BID) | ORAL | Status: DC
  Administered 2017-06-30 – 2017-07-03 (×6): 300 mg via ORAL
  Filled 2017-06-30 (×6): qty 1

## 2017-06-30 MED ORDER — GUAIFENESIN-DM 100-10 MG/5ML PO SYRP
5.0000 mL | ORAL_SOLUTION | ORAL | Status: DC | PRN
  Administered 2017-06-30: 5 mL via ORAL
  Filled 2017-06-30: qty 5

## 2017-06-30 MED ORDER — MAGNESIUM SULFATE 2 GM/50ML IV SOLN
2.0000 g | Freq: Once | INTRAVENOUS | Status: AC
Start: 2017-06-30 — End: 2017-06-30
  Administered 2017-06-30: 2 g via INTRAVENOUS
  Filled 2017-06-30: qty 50

## 2017-06-30 MED ORDER — MOMETASONE FURO-FORMOTEROL FUM 100-5 MCG/ACT IN AERO
2.0000 | INHALATION_SPRAY | Freq: Two times a day (BID) | RESPIRATORY_TRACT | Status: DC
  Administered 2017-06-30 – 2017-07-03 (×7): 2 via RESPIRATORY_TRACT
  Filled 2017-06-30: qty 8.8

## 2017-06-30 NOTE — Progress Notes (Addendum)
[employer] - [place] at [facility]   PATIENT NAME: Dustin [name]    MR#:  [id number]  DATE OF BIRTH:  05/01/1956  SUBJECTIVE:  CHIEF COMPLAINT:   Chief Complaint  Patient presents with  . Respiratory Distress   The patient was put on Ventimask due to worsening shortness breath and hypoxia this morning. He is very anxious. He is put back on O2 [place] 5 L. REVIEW OF SYSTEMS:  Review of Systems  Constitutional: Negative for chills, fever and malaise/fatigue.  HENT: Negative for sore throat.   Eyes: Negative for blurred vision and double vision.  Respiratory: Positive for cough, shortness of breath and wheezing. Negative for hemoptysis and stridor.   Cardiovascular: Negative for chest pain, palpitations, orthopnea and leg swelling.  Gastrointestinal: Negative for abdominal pain, blood in stool, diarrhea, melena, nausea and vomiting.  Genitourinary: Negative for dysuria, flank pain and hematuria.  Musculoskeletal: Negative for back pain and joint pain.  Skin: Negative for rash.  Neurological: Negative for dizziness, sensory change, focal weakness, seizures, loss of consciousness, weakness and headaches.  Endo/Heme/Allergies: Negative for polydipsia.  Psychiatric/Behavioral: Negative for depression. The patient is nervous/anxious.     DRUG ALLERGIES:   Allergies  Allergen Reactions  . Asa [Aspirin] Other (See Comments)    Reaction: swelling of the right side.  . Bee Venom Swelling  . Codeine Other (See Comments)    Reaction: Difficulty breathing  . Ibuprofen Other (See Comments)    Reaction: Swelling of the right side.   VITALS:  Blood pressure 120/66, pulse 99, temperature 98.1 F (36.7 C), temperature source Oral, resp. rate 18, height  (1.778 m), weight 215 lb (97.5 kg), SpO2 93 %. PHYSICAL EXAMINATION:  Physical Exam  Constitutional: He is oriented to person, place, and time and well-developed, well-nourished, and in no distress.  Morbid obesity.   HENT:  Head: Normocephalic.  Mouth/Throat: Oropharynx is clear and moist.  Eyes: Pupils are equal, round, and reactive to light. Conjunctivae and EOM are normal. No scleral icterus.  Neck: Normal range of motion. Neck supple. No JVD present. No tracheal deviation present.  Cardiovascular: Normal rate, regular rhythm and normal heart sounds.  Exam reveals no gallop.   No murmur heard. Pulmonary/Chest: Effort normal. No respiratory distress. He has wheezes. He has no rales.  Abdominal: Soft. Bowel sounds are normal. He exhibits no distension. There is no tenderness. There is no rebound.  Musculoskeletal: Normal range of motion. He exhibits no edema or tenderness.  Neurological: He is alert and oriented to person, place, and time. No cranial nerve deficit.  Skin: No rash noted. No erythema.  Psychiatric: Affect normal.   LABORATORY PANEL:  Male CBC  Recent Labs Lab 06/29/17 0351  WBC 9.1  HGB 13.5  HCT 40.1  PLT 155   ------------------------------------------------------------------------------------------------------------------ Chemistries   Recent Labs Lab 06/28/17 1721 06/29/17 0351  NA 140 142  K 4.1 4.8  CL 99* 105  CO2 33* 33*  GLUCOSE 106* 145*  BUN 17 18  CREATININE 1.15 0.87  CALCIUM 9.1 8.9  AST 20  --   ALT 14*  --   ALKPHOS 59  --   BILITOT 0.7  --    RADIOLOGY:  No results found. ASSESSMENT AND PLAN:   Patient is a 61 year old male with history of COPD with worsening shortness of breath  1. Acute on chronic respiratory failure With hypoxia due to acute on chronic COPD exasperation Continue nebulizers every 6 hours, taper IV Solu-Medrol, continue budesonide,  dulera nebs and Mucinex. Azithromycin for acute bronchitis Continue theophylline  2. Chronic diastolic CHF continue Lasix currently stable.  3. History of tachycardia continue Cardizem  4. Anxiety depression continue alprazolam  5. Tobacco abuse. Smoking cessation was counseled for  5 minutes. He said he will quit.  Morbid obesity.  Anxiety. Xanax 3 times a day prn.  All the records are reviewed and case discussed with Care Management/Social Worker. Management plans discussed with the patient, family and they are in agreement.  CODE STATUS: Full Code  TOTAL TIME TAKING CARE OF THIS PATIENT: 37 minutes.   More than 50% of the time was spent in counseling/coordination of care: YES  POSSIBLE D/C IN 2 DAYS, DEPENDING ON CLINICAL CONDITION.   Shaune Pollack M.D on 06/30/2017 at 2:22 PM  Between 7am to 6pm - Pager - [phone]  After 6pm go to [email or web address] - Therapist, nutritional Hospitalists

## 2017-07-01 MED ORDER — INFLUENZA VAC SPLIT QUAD 0.5 ML IM SUSY
0.5000 mL | PREFILLED_SYRINGE | INTRAMUSCULAR | Status: AC
  Administered 2017-07-03: 0.5 mL via INTRAMUSCULAR
  Filled 2017-07-01: qty 0.5

## 2017-07-01 NOTE — Progress Notes (Signed)
cpap pressure set at 14 cm. Per pt advice.

## 2017-07-01 NOTE — Progress Notes (Signed)
[facility] - [place] at [facility]   PATIENT NAME: Dustin [name]    MR#:  [id number]  DATE OF BIRTH:  1955/09/24  SUBJECTIVE:  CHIEF COMPLAINT:   Chief Complaint  Patient presents with  . Respiratory Distress   Better shortness breath and wheezing,  But on O2 [place] 6 L. REVIEW OF SYSTEMS:  Review of Systems  Constitutional: Negative for chills, fever and malaise/fatigue.  HENT: Negative for sore throat.   Eyes: Negative for blurred vision and double vision.  Respiratory: Positive for cough, shortness of breath and wheezing. Negative for hemoptysis and stridor.   Cardiovascular: Negative for chest pain, palpitations, orthopnea and leg swelling.  Gastrointestinal: Negative for abdominal pain, blood in stool, diarrhea, melena, nausea and vomiting.  Genitourinary: Negative for dysuria, flank pain and hematuria.  Musculoskeletal: Negative for back pain and joint pain.  Skin: Negative for rash.  Neurological: Negative for dizziness, sensory change, focal weakness, seizures, loss of consciousness, weakness and headaches.  Endo/Heme/Allergies: Negative for polydipsia.  Psychiatric/Behavioral: Negative for depression. The patient is nervous/anxious.     DRUG ALLERGIES:   Allergies  Allergen Reactions  . Asa [Aspirin] Other (See Comments)    Reaction: swelling of the right side.  . Bee Venom Swelling  . Codeine Other (See Comments)    Reaction: Difficulty breathing  . Ibuprofen Other (See Comments)    Reaction: Swelling of the right side.   VITALS:  Blood pressure 128/72, pulse 89, temperature 98.5 F (36.9 C), temperature source Oral, resp. rate 18, height  (1.778 m), weight 215 lb 14.4 oz (97.9 kg), SpO2 94 %. PHYSICAL EXAMINATION:  Physical Exam  Constitutional: He is oriented to person, place, and time and well-developed, well-nourished, and in no distress.  Morbid obesity.  HENT:  Head: Normocephalic.  Mouth/Throat: Oropharynx is clear and  moist.  Eyes: Pupils are equal, round, and reactive to light. Conjunctivae and EOM are normal. No scleral icterus.  Neck: Normal range of motion. Neck supple. No JVD present. No tracheal deviation present.  Cardiovascular: Normal rate, regular rhythm and normal heart sounds.  Exam reveals no gallop.   No murmur heard. Pulmonary/Chest: Effort normal. No respiratory distress. He has wheezes. He has no rales.  Abdominal: Soft. Bowel sounds are normal. He exhibits no distension. There is no tenderness. There is no rebound.  Musculoskeletal: Normal range of motion. He exhibits no edema or tenderness.  Neurological: He is alert and oriented to person, place, and time. No cranial nerve deficit.  Skin: No rash noted. No erythema.  Psychiatric: Affect normal.   LABORATORY PANEL:  Male CBC  Recent Labs Lab 06/29/17 0351  WBC 9.1  HGB 13.5  HCT 40.1  PLT 155   ------------------------------------------------------------------------------------------------------------------ Chemistries   Recent Labs Lab 06/28/17 1721 06/29/17 0351  NA 140 142  K 4.1 4.8  CL 99* 105  CO2 33* 33*  GLUCOSE 106* 145*  BUN 17 18  CREATININE 1.15 0.87  CALCIUM 9.1 8.9  AST 20  --   ALT 14*  --   ALKPHOS 59  --   BILITOT 0.7  --    RADIOLOGY:  No results found. ASSESSMENT AND PLAN:   Patient is a 61 year old male with history of COPD with worsening shortness of breath  1. Acute on chronic respiratory failure With hypoxia due to acute on chronic COPD exasperation Continue nebulizers every 6 hours, taper IV Solu-Medrol, continue budesonide, dulera nebs and Mucinex. Azithromycin for acute bronchitis Continue theophylline  2. Chronic diastolic  CHF continue Lasix currently stable.  3. History of tachycardia continue Cardizem  4. Anxiety depression continue alprazolam  5. Tobacco abuse. Smoking cessation was counseled for 5 minutes. He said he will quit.  Morbid obesity.  Anxiety. Xanax 3  times a day prn.  All the records are reviewed and case discussed with Care Management/Social Worker. Management plans discussed with the patient, family and they are in agreement.  CODE STATUS: Full Code  TOTAL TIME TAKING CARE OF THIS PATIENT: 36 minutes.   More than 50% of the time was spent in counseling/coordination of care: YES  POSSIBLE D/C IN 2 DAYS, DEPENDING ON CLINICAL CONDITION.   Shaune Pollack M.D on 07/01/2017 at 12:39 PM  Between 7am to 6pm - Pager - [phone]  After 6pm go to [email or web address] - Therapist, [facility]

## 2017-07-02 MED ORDER — IPRATROPIUM-ALBUTEROL 0.5-2.5 (3) MG/3ML IN SOLN
3.0000 mL | Freq: Three times a day (TID) | RESPIRATORY_TRACT | Status: DC
  Administered 2017-07-02 – 2017-07-03 (×5): 3 mL via RESPIRATORY_TRACT
  Filled 2017-07-02 (×5): qty 3

## 2017-07-02 MED ORDER — IPRATROPIUM-ALBUTEROL 0.5-2.5 (3) MG/3ML IN SOLN: 3.0000 mL | Freq: Four times a day (QID) | RESPIRATORY_TRACT | Status: DC | PRN

## 2017-07-02 NOTE — Care Management (Addendum)
Notified by leader rounding that patient expressed concern about returning to home too soon and that he would need to make arrangements with family. I have requested PT as patient expresses concern with weakness. I understand that patient is O2 dependent at home. RNCM will follow. I spoke with patient and he states he lives with a daughter Mardene Celeste.  Patient states he has a car that he drives but sometimes has to depend on [place]. PCP Dr.  Beverely Risen. Independent with mobility prior to this admission. He owns a rollator and a front-wheeled walker if needed. 2L supplemental O2 through [facility] at baseline. He agrees to home health through [name] if needed.

## 2017-07-02 NOTE — Progress Notes (Signed)
[facility] - [place] at [facility]   PATIENT NAME: Dustin [name]    MR#:  [id number]  DATE OF BIRTH:  23-Mar-1956  SUBJECTIVE:  Sob improving. Issues with chronic anxiety  REVIEW OF SYSTEMS:   Review of Systems  Constitutional: Negative for chills, fever and weight loss.  HENT: Negative for ear discharge, ear pain and nosebleeds.   Eyes: Negative for blurred vision, pain and discharge.  Respiratory: Positive for cough, sputum production and shortness of breath. Negative for wheezing and stridor.   Cardiovascular: Negative for chest pain, palpitations, orthopnea and PND.  Gastrointestinal: Negative for abdominal pain, diarrhea, nausea and vomiting.  Genitourinary: Negative for frequency and urgency.  Musculoskeletal: Negative for back pain and joint pain.  Neurological: Positive for weakness. Negative for sensory change, speech change and focal weakness.  Psychiatric/Behavioral: Negative for depression and hallucinations. The patient is not nervous/anxious.    Tolerating Diet:yes Tolerating PT: pending  DRUG ALLERGIES:   Allergies  Allergen Reactions  . Asa [Aspirin] Other (See Comments)    Reaction: swelling of the right side.  . Bee Venom Swelling  . Codeine Other (See Comments)    Reaction: Difficulty breathing  . Ibuprofen Other (See Comments)    Reaction: Swelling of the right side.    VITALS:  Blood pressure 137/77, pulse (!) 103, temperature 97.8 F (36.6 C), temperature source Oral, resp. rate 20, height  (1.778 m), weight 99.5 kg (219 lb 6.4 oz), SpO2 91 %.  PHYSICAL EXAMINATION:   Physical Exam  GENERAL:  61 y.o.-year-old patient lying in the bed with no acute distress.  EYES: Pupils equal, round, reactive to light and accommodation. No scleral icterus. Extraocular muscles intact.  HEENT: Head atraumatic, normocephalic. Oropharynx and nasopharynx clear.  NECK:  Supple, no jugular venous distention. No thyroid enlargement, no  tenderness.  LUNGS: distant coarse breath sounds bilaterally, no wheezing, rales, rhonchi. No use of accessory muscles of respiration.  CARDIOVASCULAR: S1, S2 normal. No murmurs, rubs, or gallops.  ABDOMEN: Soft, nontender, nondistended. Bowel sounds present. No organomegaly or mass.  EXTREMITIES: No cyanosis, clubbing or edema b/l.    NEUROLOGIC: Cranial nerves II through XII are intact. No focal Motor or sensory deficits b/l.   PSYCHIATRIC:  patient is alert and oriented x 3.  SKIN: No obvious rash, lesion, or ulcer.   LABORATORY PANEL:  CBC  Recent Labs Lab 06/29/17 0351  WBC 9.1  HGB 13.5  HCT 40.1  PLT 155    Chemistries   Recent Labs Lab 06/28/17 1721 06/29/17 0351  NA 140 142  K 4.1 4.8  CL 99* 105  CO2 33* 33*  GLUCOSE 106* 145*  BUN 17 18  CREATININE 1.15 0.87  CALCIUM 9.1 8.9  AST 20  --   ALT 14*  --   ALKPHOS 59  --   BILITOT 0.7  --    Cardiac Enzymes  Recent Labs Lab 06/28/17 1721  TROPONINI <0.03   RADIOLOGY:  No results found. ASSESSMENT AND PLAN:  61 year old male with history of COPD with worsening shortness of breath  1. Acute on chronic respiratory failure With hypoxia due to acute on chronic COPD exascerbation -Continue nebulizers every 6 hours -taper IV Solu-Medrol, continue budesonide, dulera nebs and Mucinex. -completed Azithromycin for acute bronchitis -Continue theophylline  2. Chronic diastolic CHF continue Lasix currently stable.  3. History of tachycardia continue Cardizem  4. Anxiety depression continue alprazolam  5. Tobacco abuse. Smoking cessation was counseled for 5 minutes. He said  he will quit.  6.chronic Anxiety. Xanax 3 times a day prn.  If remains stbale d/c in am  Case discussed with Care Management/Social Worker. Management plans discussed with the patient, family and they are in agreement.  CODE STATUS: full  DVT Prophylaxis: lovenox  TOTAL TIME TAKING CARE OF THIS PATIENT: *30* minutes.   >50% time spent on counselling and coordination of care  POSSIBLE D/C IN *1* DAYS, DEPENDING ON CLINICAL CONDITION.  Note: This dictation was prepared with Dragon dictation along with smaller phrase technology. Any transcriptional errors that result from this process are unintentional.  Unity Luepke M.D on 07/02/2017 at 5:05 PM  Between 7am to 6pm - Pager - [phone]  After 6pm go to [email or web address] - password [email or web address] [facility]  [facility] [place] [facility]  Office  [phone]  CC: Primary care physician; Lyndon Code, MDPatient ID: Dustin [name], male   DOB: 09-15-1956, 61 y.o.   MRN: [id number]

## 2017-07-03 DIAGNOSIS — Z23 Encounter for immunization: Secondary | ICD-10-CM | POA: Diagnosis not present

## 2017-07-03 LAB — CULTURE, RESPIRATORY: CULTURE: NORMAL

## 2017-07-03 LAB — CREATININE, SERUM
Creatinine, Ser: 1.07 mg/dL (ref 0.61–1.24)
GFR calc Af Amer: >60 mL/min (ref >60)
GFR calc non Af Amer: >60 mL/min (ref >60)

## 2017-07-03 LAB — CULTURE, RESPIRATORY W GRAM STAIN

## 2017-07-03 MED ORDER — PREDNISONE 50 MG PO TABS: ORAL_TABLET | ORAL | 0 refills | Status: DC

## 2017-07-03 MED ORDER — GUAIFENESIN ER 600 MG PO TB12: 600.0000 mg | ORAL_TABLET | Freq: Two times a day (BID) | ORAL | 0 refills | Status: DC

## 2017-07-03 MED ORDER — PREDNISONE 50 MG PO TABS
50.0000 mg | ORAL_TABLET | Freq: Every day | ORAL | Status: DC
  Administered 2017-07-03: 50 mg via ORAL
  Filled 2017-07-03: qty 1

## 2017-07-03 NOTE — Care Management (Signed)
PT is currently in patient's room. RNCM will follow.

## 2017-07-03 NOTE — Care Management (Signed)
PT pending. I have updated [name] of patient's discharge. RNCM will follow.

## 2017-07-03 NOTE — Care Management Important Message (Signed)
Important Message  Patient Details  Name: CY BRESEE MRN: [id number] Date of Birth: [birth year]   Medicare Important Message Given:  Yes    Collie Siad, RN 07/03/2017, 9:55 AM

## 2017-07-03 NOTE — Evaluation (Signed)
Physical Therapy Evaluation Patient Details Name: Dustin [name] MRN: [id number] DOB: 1956-05-25 Today's Date: 07/03/2017   History of Present Illness  presented to ER secondary to SOB; admitted with acute/chronic respiratory failure secondary to COPD exacerbation. Initially requiring BiPAP, now transitioned to 3L via [place].  Clinical Impression  Upon evaluation, patient alert and oriented; follows all commands and demonstrates good effort with all therapeutic tasks.  Demonstrates ability to complete bed mobility with mod indep; sit/stand, basic transfers and gait (75' x2) with and without assist device, cga/close sup.  Optimal stability, energy conservation and overall activity tolerance noted with use of RW; do recommend continued use at this time to optimize performance and oxygenation with functional activities. Did note desat to 83-84% on 4L with exertion, requiring 45-60 seconds of seated rest for recovery >90%.  Educated on recognition of signs/symptoms of fatigue, importance of activity pacing/energy consevation.  Patient voiced understanding/awareness.  MD informed/aware. Would benefit from skilled PT to address above deficits and promote optimal return to PLOF; Recommend transition to HHPT and HHOT upon discharge from acute hospitalization.     Follow Up Recommendations Home health PT [facility])    Equipment Recommendations       Recommendations for Other Services       Precautions / Restrictions Precautions Precautions: Fall Restrictions Weight Bearing Restrictions: No      Mobility  Bed Mobility Overal bed mobility: Modified Independent                Transfers Overall transfer level: Needs assistance Equipment used: None Transfers: Sit to/from Stand Sit to Stand: Min guard            Ambulation/Gait Ambulation/Gait assistance: Min guard Ambulation Distance (Feet): 75 Feet Assistive device: None   Gait velocity: 10' walk time, 11-12 seconds   General  Gait Details: reciprocal stepping pattern, mild abdominal flaring with limited trunk rotation/arm swing.  Slow, steady cadence, but no overt buckling or LOB.  Stairs            Wheelchair Mobility    Modified Rankin (Stroke Patients Only)       Balance Overall balance assessment: Needs assistance Sitting-balance support: No upper extremity supported;Feet supported Sitting balance-Leahy Scale: Good     Standing balance support: No upper extremity supported Standing balance-Leahy Scale: Fair                               Pertinent Vitals/Pain Pain Assessment: No/denies pain    Home Living Family/patient expects to be discharged to:: Private residence Living Arrangements: Children Available Help at Discharge: Family Type of Home: House Home Access: Stairs to enter Entrance Stairs-Rails: None Entrance Stairs-Number of Steps: 3  Home Layout: One level Home Equipment: Environmental consultant - 2 wheels;Shower seat;Grab bars - tub/shower;Walker - 4 wheels      Prior Function Level of Independence: Independent         Comments: Indep with ADLs, household and limited community distances; home O2 at 2-3L. Denies fall history.     Hand Dominance   Dominant Hand: Right    Extremity/Trunk Assessment   Upper Extremity Assessment Upper Extremity Assessment: Overall WFL for tasks assessed    Lower Extremity Assessment Lower Extremity Assessment: Overall WFL for tasks assessed (grossly at least 4/5 throughout)       Communication   Communication: No difficulties  Cognition Arousal/Alertness: Awake/alert Behavior During Therapy: WFL for tasks assessed/performed Overall Cognitive Status: Within Functional Limits  for tasks assessed                                        General Comments      Exercises Other Exercises Other Exercises: 44' with RW, cga/close sup-improved energy conservation and overall activity tolerance with use of assist  device Other Exercises: Educated on activity pacing, energy conservation and home safety awareness/set up; patient voiced understanding.   Assessment/Plan    PT Assessment Patient needs continued PT services  PT Problem List Decreased strength;Decreased range of motion;Decreased activity tolerance;Decreased balance;Decreased mobility;Decreased coordination;Decreased knowledge of precautions;Cardiopulmonary status limiting activity       PT Treatment Interventions DME instruction;Gait training;Stair training;Functional mobility training;Balance training;Therapeutic activities;Therapeutic exercise;Patient/family education    PT Goals (Current goals can be found in the Care Plan section)  Acute Rehab PT Goals Patient Stated Goal: to get my breath back PT Goal Formulation: With patient Time For Goal Achievement: 07/17/17 Potential to Achieve Goals: Good    Frequency Min 2X/week   Barriers to discharge        Co-evaluation               AM-PAC PT "6 Clicks" Daily Activity  Outcome Measure Difficulty turning over in bed (including adjusting bedclothes, sheets and blankets)?: None Difficulty moving from lying on back to sitting on the side of the bed? : None Difficulty sitting down on and standing up from a chair with arms (e.g., wheelchair, bedside commode, etc,.)?: A Little Help needed moving to and from a bed to chair (including a wheelchair)?: A Little Help needed walking in hospital room?: A Little Help needed climbing 3-5 steps with a railing? : A Little 6 Click Score: 20    End of Session Equipment Utilized During Treatment: Gait belt;Oxygen Activity Tolerance: Patient tolerated treatment well Patient left: in chair;with call bell/phone within reach;with chair alarm set Nurse Communication: Mobility status PT Visit Diagnosis: Difficulty in walking, not elsewhere classified (R26.2);Muscle weakness (generalized) (M62.81)    Time: 0930-1005 PT Time Calculation (min)  (ACUTE ONLY): 35 min   Charges:   PT Evaluation $PT Eval Moderate Complexity: 1 Mod PT Treatments $Gait Training: 8-22 mins $Therapeutic Activity: 8-22 mins   PT G Codes:   PT G-Codes **NOT FOR INPATIENT CLASS** Functional Assessment Tool Used: AM-PAC 6 Clicks Basic Mobility Functional Limitation: Mobility: Walking and moving around Mobility: Walking and Moving Around Current Status (Z6109): At least 20 percent but less than 40 percent impaired, limited or restricted Mobility: Walking and Moving Around Goal Status [phone]): At least 1 percent but less than 20 percent impaired, limited or restricted    Modene Andy H. Manson Passey, PT, DPT, NCS 07/03/17, 10:38 AM [phone]

## 2017-07-03 NOTE — Care Management (Signed)
I have updated PT/OT need with [name]. Patient's daughter to bring in portable O2 for discharge to home today.

## 2017-07-03 NOTE — Discharge Instructions (Signed)
Keep your appt with Dr Carolynne Edouard for coming thursday

## 2017-07-03 NOTE — Progress Notes (Signed)
Discharge instructions given to patient with verbalization of understanding. IV will be discontinued with his ride arrives. Patient currently waiting on his future son-in-law to arrive with home oxygen.

## 2017-07-03 NOTE — Discharge Summary (Signed)
[facility] [place] [facility]   PATIENT NAME: Dustin [name]    MR#:  [id number]  DATE OF BIRTH:  1956/04/24  DATE OF ADMISSION:  06/28/2017 ADMITTING PHYSICIAN: Auburn Bilberry, MD  DATE OF DISCHARGE: 07/03/17  PRIMARY CARE PHYSICIAN: Lyndon Code, MD    ADMISSION DIAGNOSIS:  COPD exacerbation (HCC) [J44.1]  DISCHARGE DIAGNOSIS:  Acute on Chronic COPD flare End stage emphysema Chronic Oxygen SECONDARY DIAGNOSIS:   Past Medical History:  Diagnosis Date  . Allergy   . Anxiety   . Anxiety   . Asthma   . Chronic diastolic CHF (congestive heart failure) (HCC)    a. echo 07/2013: EF 60-65%, DD, biatrial dilatation, Ao sclerosis, dilated RV, moderate pulmonary HTN, elevated CV and RA pressures; b. patient reported echo at Dr. Milta Deiters office 02/2015 - his office does not have record of him being a pt there c. echo 11/2015: EF 60-65%, Grade 1 DD, mod-severe pulm pressures  . Chronic respiratory failure (HCC)    a. on 2L via nasal cannula; b. secondary to COPD  . COPD (chronic obstructive pulmonary disease) (HCC)   . Depression   . Depression   . Depression   . Emphysema of lung (HCC)   . GERD (gastroesophageal reflux disease)   . Hypertension   . Personal history of tobacco use, presenting hazards to health 08/17/2015  . Tobacco abuse     HOSPITAL COURSE:   61 year old male with history of COPD with worsening shortness of breath  1. Acute on chronic respiratory failure With hypoxia due to acute on chronic COPD exascerbation -Continue nebulizers every 6 hours -taper IV Solu-Medrol, continue budesonide, dulera nebs and Mucinex.---slow steroid taper -completed Azithromycin for acute bronchitis -Continue theophylline  2. Chronic diastolic CHF continue Lasix currently stable.  3. History of tachycardia continue Cardizem  4. Anxiety depression continue alprazolam  5. Tobacco abuse. Smoking cessation was counseled for 5 minutes. He said  he will quit.  6.chronic Anxiety. Xanax 3 times a day prn.  PT to see pt Spoke with sister Dustin [name] at length D/c home later today CONSULTS OBTAINED:    DRUG ALLERGIES:   Allergies  Allergen Reactions  . Asa [Aspirin] Other (See Comments)    Reaction: swelling of the right side.  . Bee Venom Swelling  . Codeine Other (See Comments)    Reaction: Difficulty breathing  . Ibuprofen Other (See Comments)    Reaction: Swelling of the right side.    DISCHARGE MEDICATIONS:   Current Discharge Medication List    START taking these medications   Details  guaiFENesin (MUCINEX) 600 MG 12 hr tablet Take 1 tablet (600 mg total) by mouth 2 (two) times daily. Qty: 14 tablet, Refills: 0    predniSONE (DELTASONE) 50 MG tablet Start 50 mg daily taper by 5 mg then stop Qty: 55 tablet, Refills: 0      CONTINUE these medications which have NOT CHANGED   Details  Albuterol Sulfate (PROAIR RESPICLICK) 108 (90 BASE) MCG/ACT AEPB Inhale 2 puffs into the lungs every 4 (four) hours as needed (for shortness of breath). Reported on 12/10/2015    ALPRAZolam (XANAX) 1 MG tablet Take 1 tablet (1 mg total) by mouth 3 (three) times daily as needed for anxiety. Qty: 15 tablet, Refills: 0    Cholecalciferol (VITAMIN D) 2000 units CAPS Take 2,000 Units by mouth daily.    escitalopram (LEXAPRO) 20 MG tablet Take 20 mg by mouth at bedtime.     furosemide (LASIX)  20 MG tablet Take 20 mg by mouth daily.     gabapentin (NEURONTIN) 300 MG capsule Take 300 mg by mouth 2 (two) times daily.     ipratropium-albuterol (DUONEB) 0.5-2.5 (3) MG/3ML SOLN Take 3 mLs by nebulization 4 (four) times daily as needed (for shortness of breath).    mometasone-formoterol (DULERA) 200-5 MCG/ACT AERO Inhale 1 puff into the lungs 2 (two) times daily.     potassium chloride (K-DUR,KLOR-CON) 10 MEQ tablet Take 10 mEq by mouth once.    Probiotic Product (PROBIOTIC-10) CAPS Take 1 capsule by mouth 2 (two) times daily.      rOPINIRole (REQUIP) 0.25 MG tablet Take 1 tablet by mouth 2 (two) times daily. Refills: 3    theophylline (UNIPHYL) 400 MG 24 hr tablet Take 400 mg by mouth daily.     Tiotropium Bromide Monohydrate (SPIRIVA RESPIMAT) 2.5 MCG/ACT AERS Inhale 2 puffs into the lungs daily.     diltiazem (CARDIZEM) 30 MG tablet Take 1 tablet (30 mg total) by mouth 3 (three) times daily as needed. Qty: 90 tablet, Refills: 6    PROAIR HFA 108 (90 Base) MCG/ACT inhaler Take 2 puffs by mouth every 6 (six) hours as needed. Refills: 3        If you experience worsening of your admission symptoms, develop shortness of breath, life threatening emergency, suicidal or homicidal thoughts you must seek medical attention immediately by calling 911 or calling your MD immediately  if symptoms less severe.  You Must read complete instructions/literature along with all the possible adverse reactions/side effects for all the Medicines you take and that have been prescribed to you. Take any new Medicines after you have completely understood and accept all the possible adverse reactions/side effects.   Please note  You were cared for by a hospitalist during your hospital stay. If you have any questions about your discharge medications or the care you received while you were in the hospital after you are discharged, you can call the unit and asked to speak with the hospitalist on call if the hospitalist that took care of you is not available. Once you are discharged, your primary care physician will handle any further medical issues. Please note that NO REFILLS for any discharge medications will be authorized once you are discharged, as it is imperative that you return to your primary care physician (or establish a relationship with a primary care physician if you do not have one) for your aftercare needs so that they can reassess your need for medications and monitor your lab values. Today   SUBJECTIVE    Appears at baseline.  Cough+ VITAL SIGNS:  Blood pressure 127/75, pulse (!) 104, temperature 98.1 F (36.7 C), temperature source Oral, resp. rate 18, height  (1.778 m), weight 99.5 kg (219 lb 6.4 oz), SpO2 (!) 88 %.  I/O:   Intake/Output Summary (Last 24 hours) at 07/03/17 0742 Last data filed at 07/03/17 0345  Gross per 24 hour  Intake             1200 ml  Output             2000 ml  Net             -800 ml    PHYSICAL EXAMINATION:  GENERAL:  61 y.o.-year-old patient lying in the bed with no acute distress.  EYES: Pupils equal, round, reactive to light and accommodation. No scleral icterus. Extraocular muscles intact.  HEENT: Head atraumatic, normocephalic. Oropharynx and nasopharynx  clear.  NECK:  Supple, no jugular venous distention. No thyroid enlargement, no tenderness.  LUNGS: distant breath sounds bilaterally, no wheezing, rales,rhonchi or crepitation. No use of accessory muscles of respiration. emphysematous chest CARDIOVASCULAR: S1, S2 normal. No murmurs, rubs, or gallops.  ABDOMEN: Soft, non-tender, non-distended. Bowel sounds present. No organomegaly or mass.  EXTREMITIES: No pedal edema, cyanosis, or clubbing.  NEUROLOGIC: Cranial nerves II through XII are intact. Muscle strength 5/5 in all extremities. Sensation intact. Gait not checked.  PSYCHIATRIC: The patient is alert and oriented x 3.  SKIN: No obvious rash, lesion, or ulcer.   DATA REVIEW:   CBC   Recent Labs Lab 06/29/17 0351  WBC 9.1  HGB 13.5  HCT 40.1  PLT 155    Chemistries   Recent Labs Lab 06/28/17 1721 06/29/17 0351 07/03/17 0445  NA 140 142  --   K 4.1 4.8  --   CL 99* 105  --   CO2 33* 33*  --   GLUCOSE 106* 145*  --   BUN 17 18  --   CREATININE 1.15 0.87 1.07  CALCIUM 9.1 8.9  --   AST 20  --   --   ALT 14*  --   --   ALKPHOS 59  --   --   BILITOT 0.7  --   --     Microbiology Results   Recent Results (from the past 240 hour(s))  Culture, sputum-assessment     Status: None   Collection  Time: 06/30/17  8:57 AM  Result Value Ref Range Status   Specimen Description SPUTUM  Final   Special Requests NONE  Final   Sputum evaluation   Final    Sputum specimen not acceptable for testing.  Please recollect.   Notified Audria Nine @ 1610 07/31/17 T[facility]    Report Status 06/30/2017 FINAL  Final  Culture, expectorated sputum-assessment     Status: None   Collection Time: 06/30/17 11:55 AM  Result Value Ref Range Status   Specimen Description EXPECTORATED SPUTUM  Final   Special Requests NONE  Final   Sputum evaluation THIS SPECIMEN IS ACCEPTABLE FOR SPUTUM CULTURE  Final   Report Status 06/30/2017 FINAL  Final  Culture, respiratory (NON-Expectorated)     Status: None (Preliminary result)   Collection Time: 06/30/17 11:55 AM  Result Value Ref Range Status   Specimen Description EXPECTORATED SPUTUM  Final   Special Requests NONE Reflexed from [id number]  Final   Gram Stain   Final    ABUNDANT WBC PRESENT, PREDOMINANTLY PMN RARE SQUAMOUS EPITHELIAL CELLS PRESENT ABUNDANT GRAM POSITIVE RODS FEW GRAM POSITIVE COCCI RARE GRAM NEGATIVE RODS    Culture   Final    CULTURE REINCUBATED FOR BETTER GROWTH Performed at [facility], [address], [place], [place] [address]    Report Status PENDING  Incomplete    RADIOLOGY:  No results found.   Management plans discussed with the patient, family and they are in agreement.  CODE STATUS:     Code Status Orders        Start     Ordered   06/28/17 2305  Full code  Continuous     06/28/17 2304    Code Status History    Date Active Date Inactive Code Status Order ID Comments User Context   01/22/2017  9:29 PM 01/29/2017  7:44 PM Full Code [id number]  Houston Siren, MD Inpatient   06/23/2016 12:01 AM 06/28/2016  7:43 PM Full  Code [id number]  Oralia Manis, MD Inpatient   03/25/2016  5:03 PM 03/31/2016 11:08 PM Full Code [id number]  Standley Brooking, MD Inpatient   11/30/2015  5:12 AM 12/06/2015  9:34 PM Full Code [id number]   Arnaldo Natal, MD Inpatient   04/13/2015  5:22 PM 04/21/2015  7:40 PM Full Code [id number]  Auburn Bilberry, MD Inpatient   03/31/2015  8:50 PM 04/05/2015  8:48 PM Full Code [id number]  Hower, Cletis Athens, MD ED      TOTAL TIME TAKING CARE OF THIS PATIENT: *40* minutes.    Majel Giel M.D on 07/03/2017 at 7:42 AM  Between 7am to 6pm - Pager - [phone] After 6pm go to [email or web address] - password [email or web address]  [facility]  Office  [phone]  CC: Primary care physician; Lyndon Code, MD

## 2017-07-03 NOTE — Progress Notes (Signed)
Patient ID: Dustin [name], male   DOB: 1956-01-23, 61 y.o.   MRN: [id number] Seen by PT recommends HHPT and OT respiratory therapy also added for home. Pt encouraged to stop smoking

## 2017-07-04 DIAGNOSIS — I5032 Chronic diastolic (congestive) heart failure: Secondary | ICD-10-CM | POA: Diagnosis not present

## 2017-07-04 DIAGNOSIS — J9621 Acute and chronic respiratory failure with hypoxia: Secondary | ICD-10-CM | POA: Diagnosis not present

## 2017-07-04 DIAGNOSIS — Z7952 Long term (current) use of systemic steroids: Secondary | ICD-10-CM | POA: Diagnosis not present

## 2017-07-04 DIAGNOSIS — J439 Emphysema, unspecified: Secondary | ICD-10-CM | POA: Diagnosis not present

## 2017-07-04 DIAGNOSIS — I110 Hypertensive heart disease with heart failure: Secondary | ICD-10-CM | POA: Diagnosis not present

## 2017-07-04 DIAGNOSIS — Z9181 History of falling: Secondary | ICD-10-CM | POA: Diagnosis not present

## 2017-07-04 DIAGNOSIS — Z9981 Dependence on supplemental oxygen: Secondary | ICD-10-CM | POA: Diagnosis not present

## 2017-07-05 DIAGNOSIS — J449 Chronic obstructive pulmonary disease, unspecified: Secondary | ICD-10-CM | POA: Diagnosis not present

## 2017-07-05 DIAGNOSIS — G4733 Obstructive sleep apnea (adult) (pediatric): Secondary | ICD-10-CM | POA: Diagnosis not present

## 2017-07-05 DIAGNOSIS — I5033 Acute on chronic diastolic (congestive) heart failure: Secondary | ICD-10-CM | POA: Diagnosis not present

## 2017-07-05 DIAGNOSIS — Z79899 Other long term (current) drug therapy: Secondary | ICD-10-CM | POA: Diagnosis not present

## 2017-07-05 DIAGNOSIS — G2581 Restless legs syndrome: Secondary | ICD-10-CM | POA: Diagnosis not present

## 2017-07-06 DIAGNOSIS — I5032 Chronic diastolic (congestive) heart failure: Secondary | ICD-10-CM | POA: Diagnosis not present

## 2017-07-06 DIAGNOSIS — Z7952 Long term (current) use of systemic steroids: Secondary | ICD-10-CM | POA: Diagnosis not present

## 2017-07-06 DIAGNOSIS — J439 Emphysema, unspecified: Secondary | ICD-10-CM | POA: Diagnosis not present

## 2017-07-06 DIAGNOSIS — Z9981 Dependence on supplemental oxygen: Secondary | ICD-10-CM | POA: Diagnosis not present

## 2017-07-06 DIAGNOSIS — I110 Hypertensive heart disease with heart failure: Secondary | ICD-10-CM | POA: Diagnosis not present

## 2017-07-06 DIAGNOSIS — J9621 Acute and chronic respiratory failure with hypoxia: Secondary | ICD-10-CM | POA: Diagnosis not present

## 2017-07-06 DIAGNOSIS — Z9181 History of falling: Secondary | ICD-10-CM | POA: Diagnosis not present

## 2017-07-09 DIAGNOSIS — I5032 Chronic diastolic (congestive) heart failure: Secondary | ICD-10-CM | POA: Diagnosis not present

## 2017-07-09 DIAGNOSIS — Z9181 History of falling: Secondary | ICD-10-CM | POA: Diagnosis not present

## 2017-07-09 DIAGNOSIS — I110 Hypertensive heart disease with heart failure: Secondary | ICD-10-CM | POA: Diagnosis not present

## 2017-07-09 DIAGNOSIS — Z7952 Long term (current) use of systemic steroids: Secondary | ICD-10-CM | POA: Diagnosis not present

## 2017-07-09 DIAGNOSIS — J439 Emphysema, unspecified: Secondary | ICD-10-CM | POA: Diagnosis not present

## 2017-07-09 DIAGNOSIS — Z9981 Dependence on supplemental oxygen: Secondary | ICD-10-CM | POA: Diagnosis not present

## 2017-07-09 DIAGNOSIS — J9621 Acute and chronic respiratory failure with hypoxia: Secondary | ICD-10-CM | POA: Diagnosis not present

## 2017-07-10 DIAGNOSIS — I5032 Chronic diastolic (congestive) heart failure: Secondary | ICD-10-CM | POA: Diagnosis not present

## 2017-07-10 DIAGNOSIS — G4733 Obstructive sleep apnea (adult) (pediatric): Secondary | ICD-10-CM | POA: Diagnosis not present

## 2017-07-10 DIAGNOSIS — J9611 Chronic respiratory failure with hypoxia: Secondary | ICD-10-CM | POA: Diagnosis not present

## 2017-07-10 DIAGNOSIS — J9621 Acute and chronic respiratory failure with hypoxia: Secondary | ICD-10-CM | POA: Diagnosis not present

## 2017-07-10 DIAGNOSIS — J439 Emphysema, unspecified: Secondary | ICD-10-CM | POA: Diagnosis not present

## 2017-07-10 DIAGNOSIS — G2581 Restless legs syndrome: Secondary | ICD-10-CM | POA: Diagnosis not present

## 2017-07-10 DIAGNOSIS — J449 Chronic obstructive pulmonary disease, unspecified: Secondary | ICD-10-CM | POA: Diagnosis not present

## 2017-07-10 DIAGNOSIS — I110 Hypertensive heart disease with heart failure: Secondary | ICD-10-CM | POA: Diagnosis not present

## 2017-07-10 DIAGNOSIS — Z7952 Long term (current) use of systemic steroids: Secondary | ICD-10-CM | POA: Diagnosis not present

## 2017-07-10 DIAGNOSIS — Z9981 Dependence on supplemental oxygen: Secondary | ICD-10-CM | POA: Diagnosis not present

## 2017-07-10 DIAGNOSIS — Z9181 History of falling: Secondary | ICD-10-CM | POA: Diagnosis not present

## 2017-07-11 DIAGNOSIS — J9621 Acute and chronic respiratory failure with hypoxia: Secondary | ICD-10-CM | POA: Diagnosis not present

## 2017-07-11 DIAGNOSIS — I110 Hypertensive heart disease with heart failure: Secondary | ICD-10-CM | POA: Diagnosis not present

## 2017-07-11 DIAGNOSIS — J439 Emphysema, unspecified: Secondary | ICD-10-CM | POA: Diagnosis not present

## 2017-07-11 DIAGNOSIS — Z9181 History of falling: Secondary | ICD-10-CM | POA: Diagnosis not present

## 2017-07-11 DIAGNOSIS — Z9981 Dependence on supplemental oxygen: Secondary | ICD-10-CM | POA: Diagnosis not present

## 2017-07-11 DIAGNOSIS — Z7952 Long term (current) use of systemic steroids: Secondary | ICD-10-CM | POA: Diagnosis not present

## 2017-07-11 DIAGNOSIS — I5032 Chronic diastolic (congestive) heart failure: Secondary | ICD-10-CM | POA: Diagnosis not present

## 2017-07-12 DIAGNOSIS — Z7952 Long term (current) use of systemic steroids: Secondary | ICD-10-CM | POA: Diagnosis not present

## 2017-07-12 DIAGNOSIS — J439 Emphysema, unspecified: Secondary | ICD-10-CM | POA: Diagnosis not present

## 2017-07-12 DIAGNOSIS — Z9981 Dependence on supplemental oxygen: Secondary | ICD-10-CM | POA: Diagnosis not present

## 2017-07-12 DIAGNOSIS — J9621 Acute and chronic respiratory failure with hypoxia: Secondary | ICD-10-CM | POA: Diagnosis not present

## 2017-07-12 DIAGNOSIS — I5032 Chronic diastolic (congestive) heart failure: Secondary | ICD-10-CM | POA: Diagnosis not present

## 2017-07-12 DIAGNOSIS — Z9181 History of falling: Secondary | ICD-10-CM | POA: Diagnosis not present

## 2017-07-12 DIAGNOSIS — I110 Hypertensive heart disease with heart failure: Secondary | ICD-10-CM | POA: Diagnosis not present

## 2017-07-13 DIAGNOSIS — Z9181 History of falling: Secondary | ICD-10-CM | POA: Diagnosis not present

## 2017-07-13 DIAGNOSIS — Z9981 Dependence on supplemental oxygen: Secondary | ICD-10-CM | POA: Diagnosis not present

## 2017-07-13 DIAGNOSIS — I5032 Chronic diastolic (congestive) heart failure: Secondary | ICD-10-CM | POA: Diagnosis not present

## 2017-07-13 DIAGNOSIS — I110 Hypertensive heart disease with heart failure: Secondary | ICD-10-CM | POA: Diagnosis not present

## 2017-07-13 DIAGNOSIS — Z7952 Long term (current) use of systemic steroids: Secondary | ICD-10-CM | POA: Diagnosis not present

## 2017-07-13 DIAGNOSIS — J9621 Acute and chronic respiratory failure with hypoxia: Secondary | ICD-10-CM | POA: Diagnosis not present

## 2017-07-13 DIAGNOSIS — J439 Emphysema, unspecified: Secondary | ICD-10-CM | POA: Diagnosis not present

## 2017-07-16 DIAGNOSIS — Z9181 History of falling: Secondary | ICD-10-CM | POA: Diagnosis not present

## 2017-07-16 DIAGNOSIS — Z7952 Long term (current) use of systemic steroids: Secondary | ICD-10-CM | POA: Diagnosis not present

## 2017-07-16 DIAGNOSIS — J9621 Acute and chronic respiratory failure with hypoxia: Secondary | ICD-10-CM | POA: Diagnosis not present

## 2017-07-16 DIAGNOSIS — Z9981 Dependence on supplemental oxygen: Secondary | ICD-10-CM | POA: Diagnosis not present

## 2017-07-16 DIAGNOSIS — J439 Emphysema, unspecified: Secondary | ICD-10-CM | POA: Diagnosis not present

## 2017-07-16 DIAGNOSIS — I110 Hypertensive heart disease with heart failure: Secondary | ICD-10-CM | POA: Diagnosis not present

## 2017-07-16 DIAGNOSIS — I5032 Chronic diastolic (congestive) heart failure: Secondary | ICD-10-CM | POA: Diagnosis not present

## 2017-07-17 DIAGNOSIS — Z9981 Dependence on supplemental oxygen: Secondary | ICD-10-CM | POA: Diagnosis not present

## 2017-07-17 DIAGNOSIS — Z9181 History of falling: Secondary | ICD-10-CM | POA: Diagnosis not present

## 2017-07-17 DIAGNOSIS — I5032 Chronic diastolic (congestive) heart failure: Secondary | ICD-10-CM | POA: Diagnosis not present

## 2017-07-17 DIAGNOSIS — Z7952 Long term (current) use of systemic steroids: Secondary | ICD-10-CM | POA: Diagnosis not present

## 2017-07-17 DIAGNOSIS — J439 Emphysema, unspecified: Secondary | ICD-10-CM | POA: Diagnosis not present

## 2017-07-17 DIAGNOSIS — J9621 Acute and chronic respiratory failure with hypoxia: Secondary | ICD-10-CM | POA: Diagnosis not present

## 2017-07-17 DIAGNOSIS — I110 Hypertensive heart disease with heart failure: Secondary | ICD-10-CM | POA: Diagnosis not present

## 2017-07-18 DIAGNOSIS — Z9981 Dependence on supplemental oxygen: Secondary | ICD-10-CM | POA: Diagnosis not present

## 2017-07-18 DIAGNOSIS — Z9181 History of falling: Secondary | ICD-10-CM | POA: Diagnosis not present

## 2017-07-18 DIAGNOSIS — Z7952 Long term (current) use of systemic steroids: Secondary | ICD-10-CM | POA: Diagnosis not present

## 2017-07-18 DIAGNOSIS — I110 Hypertensive heart disease with heart failure: Secondary | ICD-10-CM | POA: Diagnosis not present

## 2017-07-18 DIAGNOSIS — J9621 Acute and chronic respiratory failure with hypoxia: Secondary | ICD-10-CM | POA: Diagnosis not present

## 2017-07-18 DIAGNOSIS — J439 Emphysema, unspecified: Secondary | ICD-10-CM | POA: Diagnosis not present

## 2017-07-18 DIAGNOSIS — I5032 Chronic diastolic (congestive) heart failure: Secondary | ICD-10-CM | POA: Diagnosis not present

## 2017-07-19 DIAGNOSIS — Z9181 History of falling: Secondary | ICD-10-CM | POA: Diagnosis not present

## 2017-07-19 DIAGNOSIS — J9621 Acute and chronic respiratory failure with hypoxia: Secondary | ICD-10-CM | POA: Diagnosis not present

## 2017-07-19 DIAGNOSIS — I110 Hypertensive heart disease with heart failure: Secondary | ICD-10-CM | POA: Diagnosis not present

## 2017-07-19 DIAGNOSIS — Z9981 Dependence on supplemental oxygen: Secondary | ICD-10-CM | POA: Diagnosis not present

## 2017-07-19 DIAGNOSIS — J449 Chronic obstructive pulmonary disease, unspecified: Secondary | ICD-10-CM | POA: Diagnosis not present

## 2017-07-19 DIAGNOSIS — Z7952 Long term (current) use of systemic steroids: Secondary | ICD-10-CM | POA: Diagnosis not present

## 2017-07-19 DIAGNOSIS — J439 Emphysema, unspecified: Secondary | ICD-10-CM | POA: Diagnosis not present

## 2017-07-19 DIAGNOSIS — I5032 Chronic diastolic (congestive) heart failure: Secondary | ICD-10-CM | POA: Diagnosis not present

## 2017-07-23 DIAGNOSIS — Z9181 History of falling: Secondary | ICD-10-CM | POA: Diagnosis not present

## 2017-07-23 DIAGNOSIS — J439 Emphysema, unspecified: Secondary | ICD-10-CM | POA: Diagnosis not present

## 2017-07-23 DIAGNOSIS — Z9981 Dependence on supplemental oxygen: Secondary | ICD-10-CM | POA: Diagnosis not present

## 2017-07-23 DIAGNOSIS — J9621 Acute and chronic respiratory failure with hypoxia: Secondary | ICD-10-CM | POA: Diagnosis not present

## 2017-07-23 DIAGNOSIS — Z7952 Long term (current) use of systemic steroids: Secondary | ICD-10-CM | POA: Diagnosis not present

## 2017-07-23 DIAGNOSIS — I110 Hypertensive heart disease with heart failure: Secondary | ICD-10-CM | POA: Diagnosis not present

## 2017-07-23 DIAGNOSIS — I5032 Chronic diastolic (congestive) heart failure: Secondary | ICD-10-CM | POA: Diagnosis not present

## 2017-07-24 DIAGNOSIS — I110 Hypertensive heart disease with heart failure: Secondary | ICD-10-CM | POA: Diagnosis not present

## 2017-07-24 DIAGNOSIS — J9621 Acute and chronic respiratory failure with hypoxia: Secondary | ICD-10-CM | POA: Diagnosis not present

## 2017-07-24 DIAGNOSIS — J439 Emphysema, unspecified: Secondary | ICD-10-CM | POA: Diagnosis not present

## 2017-07-24 DIAGNOSIS — Z9981 Dependence on supplemental oxygen: Secondary | ICD-10-CM | POA: Diagnosis not present

## 2017-07-24 DIAGNOSIS — Z9181 History of falling: Secondary | ICD-10-CM | POA: Diagnosis not present

## 2017-07-24 DIAGNOSIS — Z7952 Long term (current) use of systemic steroids: Secondary | ICD-10-CM | POA: Diagnosis not present

## 2017-07-24 DIAGNOSIS — I5032 Chronic diastolic (congestive) heart failure: Secondary | ICD-10-CM | POA: Diagnosis not present

## 2017-07-25 DIAGNOSIS — R0602 Shortness of breath: Secondary | ICD-10-CM | POA: Diagnosis not present

## 2017-07-26 DIAGNOSIS — J9621 Acute and chronic respiratory failure with hypoxia: Secondary | ICD-10-CM | POA: Diagnosis not present

## 2017-07-26 DIAGNOSIS — I5032 Chronic diastolic (congestive) heart failure: Secondary | ICD-10-CM | POA: Diagnosis not present

## 2017-07-26 DIAGNOSIS — Z7952 Long term (current) use of systemic steroids: Secondary | ICD-10-CM | POA: Diagnosis not present

## 2017-07-26 DIAGNOSIS — Z9981 Dependence on supplemental oxygen: Secondary | ICD-10-CM | POA: Diagnosis not present

## 2017-07-26 DIAGNOSIS — Z9181 History of falling: Secondary | ICD-10-CM | POA: Diagnosis not present

## 2017-07-26 DIAGNOSIS — I110 Hypertensive heart disease with heart failure: Secondary | ICD-10-CM | POA: Diagnosis not present

## 2017-07-26 DIAGNOSIS — J439 Emphysema, unspecified: Secondary | ICD-10-CM | POA: Diagnosis not present

## 2017-07-27 DIAGNOSIS — Z9981 Dependence on supplemental oxygen: Secondary | ICD-10-CM | POA: Diagnosis not present

## 2017-07-27 DIAGNOSIS — I5032 Chronic diastolic (congestive) heart failure: Secondary | ICD-10-CM | POA: Diagnosis not present

## 2017-07-27 DIAGNOSIS — I110 Hypertensive heart disease with heart failure: Secondary | ICD-10-CM | POA: Diagnosis not present

## 2017-07-27 DIAGNOSIS — Z7952 Long term (current) use of systemic steroids: Secondary | ICD-10-CM | POA: Diagnosis not present

## 2017-07-27 DIAGNOSIS — J439 Emphysema, unspecified: Secondary | ICD-10-CM | POA: Diagnosis not present

## 2017-07-27 DIAGNOSIS — Z9181 History of falling: Secondary | ICD-10-CM | POA: Diagnosis not present

## 2017-07-27 DIAGNOSIS — J9621 Acute and chronic respiratory failure with hypoxia: Secondary | ICD-10-CM | POA: Diagnosis not present

## 2017-07-31 DIAGNOSIS — Z7952 Long term (current) use of systemic steroids: Secondary | ICD-10-CM | POA: Diagnosis not present

## 2017-07-31 DIAGNOSIS — J439 Emphysema, unspecified: Secondary | ICD-10-CM | POA: Diagnosis not present

## 2017-07-31 DIAGNOSIS — J9621 Acute and chronic respiratory failure with hypoxia: Secondary | ICD-10-CM | POA: Diagnosis not present

## 2017-07-31 DIAGNOSIS — I110 Hypertensive heart disease with heart failure: Secondary | ICD-10-CM | POA: Diagnosis not present

## 2017-07-31 DIAGNOSIS — I5032 Chronic diastolic (congestive) heart failure: Secondary | ICD-10-CM | POA: Diagnosis not present

## 2017-07-31 DIAGNOSIS — Z9981 Dependence on supplemental oxygen: Secondary | ICD-10-CM | POA: Diagnosis not present

## 2017-07-31 DIAGNOSIS — Z9181 History of falling: Secondary | ICD-10-CM | POA: Diagnosis not present

## 2017-08-02 DIAGNOSIS — Z9181 History of falling: Secondary | ICD-10-CM | POA: Diagnosis not present

## 2017-08-02 DIAGNOSIS — I110 Hypertensive heart disease with heart failure: Secondary | ICD-10-CM | POA: Diagnosis not present

## 2017-08-02 DIAGNOSIS — J439 Emphysema, unspecified: Secondary | ICD-10-CM | POA: Diagnosis not present

## 2017-08-02 DIAGNOSIS — J9621 Acute and chronic respiratory failure with hypoxia: Secondary | ICD-10-CM | POA: Diagnosis not present

## 2017-08-02 DIAGNOSIS — Z7952 Long term (current) use of systemic steroids: Secondary | ICD-10-CM | POA: Diagnosis not present

## 2017-08-02 DIAGNOSIS — Z9981 Dependence on supplemental oxygen: Secondary | ICD-10-CM | POA: Diagnosis not present

## 2017-08-02 DIAGNOSIS — I5032 Chronic diastolic (congestive) heart failure: Secondary | ICD-10-CM | POA: Diagnosis not present

## 2017-08-03 DIAGNOSIS — I110 Hypertensive heart disease with heart failure: Secondary | ICD-10-CM | POA: Diagnosis not present

## 2017-08-03 DIAGNOSIS — Z9981 Dependence on supplemental oxygen: Secondary | ICD-10-CM | POA: Diagnosis not present

## 2017-08-03 DIAGNOSIS — J439 Emphysema, unspecified: Secondary | ICD-10-CM | POA: Diagnosis not present

## 2017-08-03 DIAGNOSIS — Z9181 History of falling: Secondary | ICD-10-CM | POA: Diagnosis not present

## 2017-08-03 DIAGNOSIS — I5032 Chronic diastolic (congestive) heart failure: Secondary | ICD-10-CM | POA: Diagnosis not present

## 2017-08-03 DIAGNOSIS — J9621 Acute and chronic respiratory failure with hypoxia: Secondary | ICD-10-CM | POA: Diagnosis not present

## 2017-08-03 DIAGNOSIS — Z7952 Long term (current) use of systemic steroids: Secondary | ICD-10-CM | POA: Diagnosis not present

## 2017-08-05 DIAGNOSIS — Z9981 Dependence on supplemental oxygen: Secondary | ICD-10-CM | POA: Diagnosis not present

## 2017-08-05 DIAGNOSIS — J9621 Acute and chronic respiratory failure with hypoxia: Secondary | ICD-10-CM | POA: Diagnosis not present

## 2017-08-05 DIAGNOSIS — Z9181 History of falling: Secondary | ICD-10-CM | POA: Diagnosis not present

## 2017-08-05 DIAGNOSIS — I5032 Chronic diastolic (congestive) heart failure: Secondary | ICD-10-CM | POA: Diagnosis not present

## 2017-08-05 DIAGNOSIS — I110 Hypertensive heart disease with heart failure: Secondary | ICD-10-CM | POA: Diagnosis not present

## 2017-08-05 DIAGNOSIS — Z7952 Long term (current) use of systemic steroids: Secondary | ICD-10-CM | POA: Diagnosis not present

## 2017-08-05 DIAGNOSIS — J439 Emphysema, unspecified: Secondary | ICD-10-CM | POA: Diagnosis not present

## 2017-08-06 ENCOUNTER — Other Ambulatory Visit: Payer: Self-pay

## 2017-08-06 ENCOUNTER — Encounter: Payer: Self-pay | Admitting: Emergency Medicine

## 2017-08-06 ENCOUNTER — Inpatient Hospital Stay
Admission: EM | Admit: 2017-08-06 | Discharge: 2017-08-14 | DRG: 191 | Disposition: A | Discharge status: Skilled Nursing Facility | Payer: Medicare Other | Attending: Internal Medicine | Admitting: Internal Medicine

## 2017-08-06 ENCOUNTER — Emergency Department: Payer: Medicare Other

## 2017-08-06 DIAGNOSIS — K219 Gastro-esophageal reflux disease without esophagitis: Secondary | ICD-10-CM | POA: Diagnosis present

## 2017-08-06 DIAGNOSIS — J9610 Chronic respiratory failure, unspecified whether with hypoxia or hypercapnia: Secondary | ICD-10-CM | POA: Diagnosis not present

## 2017-08-06 DIAGNOSIS — F17210 Nicotine dependence, cigarettes, uncomplicated: Secondary | ICD-10-CM | POA: Diagnosis present

## 2017-08-06 DIAGNOSIS — I5032 Chronic diastolic (congestive) heart failure: Secondary | ICD-10-CM | POA: Diagnosis not present

## 2017-08-06 DIAGNOSIS — J9611 Chronic respiratory failure with hypoxia: Secondary | ICD-10-CM | POA: Diagnosis present

## 2017-08-06 DIAGNOSIS — Z79899 Other long term (current) drug therapy: Secondary | ICD-10-CM

## 2017-08-06 DIAGNOSIS — J439 Emphysema, unspecified: Secondary | ICD-10-CM | POA: Diagnosis not present

## 2017-08-06 DIAGNOSIS — I509 Heart failure, unspecified: Secondary | ICD-10-CM | POA: Diagnosis not present

## 2017-08-06 DIAGNOSIS — I10 Essential (primary) hypertension: Secondary | ICD-10-CM | POA: Diagnosis not present

## 2017-08-06 DIAGNOSIS — Z9981 Dependence on supplemental oxygen: Secondary | ICD-10-CM

## 2017-08-06 DIAGNOSIS — R252 Cramp and spasm: Secondary | ICD-10-CM | POA: Diagnosis not present

## 2017-08-06 DIAGNOSIS — Z7952 Long term (current) use of systemic steroids: Secondary | ICD-10-CM | POA: Diagnosis not present

## 2017-08-06 DIAGNOSIS — I110 Hypertensive heart disease with heart failure: Secondary | ICD-10-CM | POA: Diagnosis present

## 2017-08-06 DIAGNOSIS — R0602 Shortness of breath: Secondary | ICD-10-CM | POA: Diagnosis not present

## 2017-08-06 DIAGNOSIS — Z9181 History of falling: Secondary | ICD-10-CM | POA: Diagnosis not present

## 2017-08-06 DIAGNOSIS — F329 Major depressive disorder, single episode, unspecified: Secondary | ICD-10-CM | POA: Diagnosis present

## 2017-08-06 DIAGNOSIS — F419 Anxiety disorder, unspecified: Secondary | ICD-10-CM | POA: Diagnosis present

## 2017-08-06 DIAGNOSIS — J441 Chronic obstructive pulmonary disease with (acute) exacerbation: Principal | ICD-10-CM | POA: Diagnosis present

## 2017-08-06 DIAGNOSIS — G2581 Restless legs syndrome: Secondary | ICD-10-CM | POA: Diagnosis not present

## 2017-08-06 DIAGNOSIS — J9621 Acute and chronic respiratory failure with hypoxia: Secondary | ICD-10-CM | POA: Diagnosis not present

## 2017-08-06 DIAGNOSIS — Z9911 Dependence on respirator [ventilator] status: Secondary | ICD-10-CM | POA: Diagnosis not present

## 2017-08-06 DIAGNOSIS — J449 Chronic obstructive pulmonary disease, unspecified: Secondary | ICD-10-CM | POA: Diagnosis not present

## 2017-08-06 DIAGNOSIS — M6281 Muscle weakness (generalized): Secondary | ICD-10-CM | POA: Diagnosis not present

## 2017-08-06 LAB — COMPREHENSIVE METABOLIC PANEL
ALK PHOS: 61 U/L (ref 38–126)
ALT: 21 U/L (ref 17–63)
ANION GAP: 9 (ref 5–15)
AST: 29 U/L (ref 15–41)
Albumin: 4 g/dL (ref 3.5–5.0)
BUN: 10 mg/dL (ref 6–20)
CALCIUM: 9.1 mg/dL (ref 8.9–10.3)
CHLORIDE: 99 mmol/L — AB (ref 101–111)
CO2: 30 mmol/L (ref 22–32)
Creatinine, Ser: 1.27 mg/dL — ABNORMAL HIGH (ref 0.61–1.24)
GFR, EST NON AFRICAN AMERICAN: 58 mL/min — AB (ref >60)
Glucose, Bld: 113 mg/dL — ABNORMAL HIGH (ref 65–99)
Potassium: 4 mmol/L (ref 3.5–5.1)
SODIUM: 138 mmol/L (ref 135–145)
Total Bilirubin: 0.5 mg/dL (ref 0.3–1.2)
Total Protein: 6.7 g/dL (ref 6.5–8.1)

## 2017-08-06 LAB — CBC WITH DIFFERENTIAL/PLATELET
BASOS ABS: 0 10*3/uL (ref 0–0.1)
BASOS PCT: 1 %
EOS PCT: 1 %
Eosinophils Absolute: 0 10*3/uL (ref 0–0.7)
HCT: 40.3 % (ref 40.0–52.0)
Hemoglobin: 13.6 g/dL (ref 13.0–18.0)
LYMPHS PCT: 22 %
Lymphs Abs: 1.2 10*3/uL (ref 1.0–3.6)
MCH: 32.7 pg (ref 26.0–34.0)
MCHC: 33.7 g/dL (ref 32.0–36.0)
MCV: 97 fL (ref 80.0–100.0)
Monocytes Absolute: 0.6 10*3/uL (ref 0.2–1.0)
Monocytes Relative: 12 %
Neutro Abs: 3.4 10*3/uL (ref 1.4–6.5)
Neutrophils Relative %: 64 %
PLATELETS: 202 10*3/uL (ref 150–440)
RBC: 4.15 MIL/uL — AB (ref 4.40–5.90)
RDW: 14.9 % — AB (ref 11.5–14.5)
WBC: 5.3 10*3/uL (ref 3.8–10.6)

## 2017-08-06 LAB — PROCALCITONIN: Procalcitonin: <0.1 ng/mL

## 2017-08-06 LAB — URINALYSIS, COMPLETE (UACMP) WITH MICROSCOPIC
BILIRUBIN URINE: NEGATIVE
Bacteria, UA: NONE SEEN
GLUCOSE, UA: NEGATIVE mg/dL
HGB URINE DIPSTICK: NEGATIVE
Ketones, ur: NEGATIVE mg/dL
Leukocytes, UA: NEGATIVE
NITRITE: NEGATIVE
PH: 7 (ref 5.0–8.0)
Protein, ur: NEGATIVE mg/dL
RBC / HPF: NONE SEEN RBC/hpf (ref 0–5)
SPECIFIC GRAVITY, URINE: 1.005 (ref 1.005–1.030)

## 2017-08-06 LAB — LACTIC ACID, PLASMA
LACTIC ACID, VENOUS: 1 mmol/L (ref 0.5–1.9)
LACTIC ACID, VENOUS: 2.7 mmol/L — AB (ref 0.5–1.9)

## 2017-08-06 LAB — PROTIME-INR
INR: 1.02
Prothrombin Time: 13.3 seconds (ref 11.4–15.2)

## 2017-08-06 MED ORDER — POTASSIUM CHLORIDE CRYS ER 20 MEQ PO TBCR
10.0000 meq | EXTENDED_RELEASE_TABLET | Freq: Once | ORAL | Status: AC
  Administered 2017-08-07: 10 meq via ORAL
  Filled 2017-08-06: qty 1

## 2017-08-06 MED ORDER — SODIUM CHLORIDE 0.9 % IV BOLUS (SEPSIS)
1000.0000 mL | Freq: Once | INTRAVENOUS | Status: AC
  Administered 2017-08-06: 1000 mL via INTRAVENOUS

## 2017-08-06 MED ORDER — ENOXAPARIN SODIUM 40 MG/0.4ML ~~LOC~~ SOLN
40.0000 mg | SUBCUTANEOUS | Status: DC
  Administered 2017-08-06 – 2017-08-13 (×8): 40 mg via SUBCUTANEOUS
  Filled 2017-08-06 (×8): qty 0.4

## 2017-08-06 MED ORDER — TIOTROPIUM BROMIDE MONOHYDRATE 18 MCG IN CAPS
1.0000 | ORAL_CAPSULE | Freq: Every day | RESPIRATORY_TRACT | Status: DC
  Administered 2017-08-07 – 2017-08-13 (×7): 18 ug via RESPIRATORY_TRACT
  Filled 2017-08-06 (×2): qty 5

## 2017-08-06 MED ORDER — ROPINIROLE HCL 0.25 MG PO TABS
0.2500 mg | ORAL_TABLET | Freq: Three times a day (TID) | ORAL | Status: DC
  Administered 2017-08-06 – 2017-08-13 (×22): 0.25 mg via ORAL
  Filled 2017-08-06 (×25): qty 1

## 2017-08-06 MED ORDER — IPRATROPIUM-ALBUTEROL 0.5-2.5 (3) MG/3ML IN SOLN
9.0000 mL | Freq: Once | RESPIRATORY_TRACT | Status: AC
  Administered 2017-08-06: 9 mL via RESPIRATORY_TRACT
  Filled 2017-08-06: qty 9

## 2017-08-06 MED ORDER — METHYLPREDNISOLONE SODIUM SUCC 125 MG IJ SOLR
125.0000 mg | Freq: Once | INTRAMUSCULAR | Status: AC
  Administered 2017-08-06: 125 mg via INTRAVENOUS
  Filled 2017-08-06: qty 2

## 2017-08-06 MED ORDER — ALPRAZOLAM 0.5 MG PO TABS
0.2500 mg | ORAL_TABLET | Freq: Three times a day (TID) | ORAL | Status: DC | PRN
  Administered 2017-08-07: 0.25 mg via ORAL
  Filled 2017-08-06 (×2): qty 1

## 2017-08-06 MED ORDER — VITAMIN D 1000 UNITS PO TABS
2000.0000 [IU] | ORAL_TABLET | Freq: Every day | ORAL | Status: DC
  Administered 2017-08-07 – 2017-08-14 (×8): 2000 [IU] via ORAL
  Filled 2017-08-06 (×9): qty 2

## 2017-08-06 MED ORDER — GABAPENTIN 300 MG PO CAPS
300.0000 mg | ORAL_CAPSULE | Freq: Three times a day (TID) | ORAL | Status: DC
  Administered 2017-08-06 – 2017-08-14 (×23): 300 mg via ORAL
  Filled 2017-08-06 (×23): qty 1

## 2017-08-06 MED ORDER — DOXYCYCLINE HYCLATE 100 MG IV SOLR
100.0000 mg | Freq: Once | INTRAVENOUS | Status: DC
  Filled 2017-08-06: qty 100

## 2017-08-06 MED ORDER — METHYLPREDNISOLONE SODIUM SUCC 40 MG IJ SOLR
40.0000 mg | Freq: Every day | INTRAMUSCULAR | Status: DC
  Administered 2017-08-07: 40 mg via INTRAVENOUS
  Filled 2017-08-06: qty 1

## 2017-08-06 MED ORDER — ACETAMINOPHEN 650 MG RE SUPP: 650.0000 mg | Freq: Four times a day (QID) | RECTAL | Status: DC | PRN

## 2017-08-06 MED ORDER — SODIUM CHLORIDE 0.9 % IV SOLN
INTRAVENOUS | Status: AC
  Administered 2017-08-07: 01:00:00 via INTRAVENOUS

## 2017-08-06 MED ORDER — ESCITALOPRAM OXALATE 10 MG PO TABS
20.0000 mg | ORAL_TABLET | Freq: Every day | ORAL | Status: DC
  Administered 2017-08-07 – 2017-08-13 (×7): 20 mg via ORAL
  Filled 2017-08-06 (×7): qty 2

## 2017-08-06 MED ORDER — IPRATROPIUM-ALBUTEROL 0.5-2.5 (3) MG/3ML IN SOLN
3.0000 mL | Freq: Four times a day (QID) | RESPIRATORY_TRACT | Status: DC
  Administered 2017-08-06 – 2017-08-14 (×31): 3 mL via RESPIRATORY_TRACT
  Filled 2017-08-06 (×31): qty 3

## 2017-08-06 MED ORDER — GUAIFENESIN ER 600 MG PO TB12
600.0000 mg | ORAL_TABLET | Freq: Two times a day (BID) | ORAL | Status: DC
  Administered 2017-08-06 – 2017-08-14 (×16): 600 mg via ORAL
  Filled 2017-08-06 (×16): qty 1

## 2017-08-06 MED ORDER — THEOPHYLLINE ER 200 MG PO CP24
400.0000 mg | ORAL_CAPSULE | Freq: Every day | ORAL | Status: DC
  Administered 2017-08-07 – 2017-08-13 (×7): 400 mg via ORAL
  Filled 2017-08-06 (×8): qty 2

## 2017-08-06 MED ORDER — ACETAMINOPHEN 325 MG PO TABS
650.0000 mg | ORAL_TABLET | Freq: Four times a day (QID) | ORAL | Status: DC | PRN
  Administered 2017-08-07: 650 mg via ORAL
  Filled 2017-08-06: qty 2

## 2017-08-06 MED ORDER — RISAQUAD PO CAPS
1.0000 | ORAL_CAPSULE | Freq: Two times a day (BID) | ORAL | Status: DC
  Administered 2017-08-06 – 2017-08-13 (×15): 1 via ORAL
  Filled 2017-08-06 (×16): qty 1

## 2017-08-06 MED ORDER — FUROSEMIDE 40 MG PO TABS
20.0000 mg | ORAL_TABLET | Freq: Every day | ORAL | Status: DC
  Administered 2017-08-07 – 2017-08-14 (×8): 20 mg via ORAL
  Filled 2017-08-06 (×9): qty 1

## 2017-08-06 MED ORDER — BUDESONIDE 0.5 MG/2ML IN SUSP
0.5000 mg | Freq: Two times a day (BID) | RESPIRATORY_TRACT | Status: DC
  Administered 2017-08-06 – 2017-08-14 (×16): 0.5 mg via RESPIRATORY_TRACT
  Filled 2017-08-06 (×17): qty 2

## 2017-08-06 NOTE — ED Triage Notes (Signed)
Pt presents with sob for last 3-4 days. Pt had recent hospitalization for pneumonia; was discharged and had productive cough, was placed on levaquin by pcp, finished that and was put on bactrim Friday. Pt is chronically on 2L O2, today is using 3.5L - fluctuates. Pt alert & oriented, obviously sob.

## 2017-08-06 NOTE — ED Notes (Signed)
Pt is aware that urine sample is needed. 

## 2017-08-06 NOTE — H&P (Signed)
[place] [facility] [place] NAME: Dustin [name]    MR#:  [id number]  DATE OF BIRTH:  08-Jan-1956  DATE OF ADMISSION:  08/06/2017  PRIMARY CARE PHYSICIAN: Lavera Guise, MD   REQUESTING/REFERRING PHYSICIAN: Dr Larae Grooms  CHIEF COMPLAINT:   Chief Complaint  Patient presents with  . Shortness of Breath    HISTORY OF PRESENT ILLNESS:  Dustin [name]  is a 61 y.o. male with a known history of COPD on chronic oxygen 2 L.  He was recently here in the hospital last month.  He was treated with a Zithromax and steroids and nebulizers for COPD exacerbation.  He went home and they titrated the steroids off.  He was doing well but then his phlegm became thick and his PMD put him on Levaquin for 7 days.  On Friday he was put on Bactrim.  He has been having a hard time breathing and fatigue.  He does smoke some cigarettes here and there.  He states he does have a fever at home and some chills and sweats.  In the ER, his chest x-ray was negative.  Hospitalist services were contacted for further evaluation.  PAST MEDICAL HISTORY:   Past Medical History:  Diagnosis Date  . Allergy   . Anxiety   . Anxiety   . Asthma   . Chronic diastolic CHF (congestive heart failure) ([place])    a. echo 07/2013: EF 60-65%, DD, biatrial dilatation, Ao sclerosis, dilated RV, moderate pulmonary HTN, elevated CV and RA pressures; b. patient reported echo at Dr. Laurelyn Sickle office 02/2015 - his office does not have record of him being a pt there c. echo 11/2015: EF 60-65%, Grade 1 DD, mod-severe pulm pressures  . Chronic respiratory failure (HCC)    a. on 2L via nasal cannula; b. secondary to COPD  . COPD (chronic obstructive pulmonary disease) ([place])   . Depression   . Depression   . Depression   . Emphysema of lung ([place])   . GERD (gastroesophageal reflux disease)   . Hypertension   . Personal history of tobacco use, presenting hazards to health 08/17/2015  . Tobacco abuse      PAST SURGICAL HISTORY:   Past Surgical History:  Procedure Laterality Date  . ABDOMINAL SURGERY    . ADENOIDECTOMY    . CARPAL TUNNEL RELEASE Right   . hemorrh    . NOSE SURGERY    . ROTATOR CUFF REPAIR Right   . TENNIS ELBOW RELEASE/NIRSCHEL PROCEDURE Right   . URETHRA SURGERY     surgery 6 times from age [age] yrs old    SOCIAL HISTORY:   Social History   Tobacco Use  . Smoking status: Current Every Day Smoker    Packs/day: 0.10    Years: 43.00    Pack years: 4.30    Types: E-cigarettes  . Smokeless tobacco: Never Used  Substance Use Topics  . Alcohol use: No    FAMILY HISTORY:   Family History  Problem Relation Age of Onset  . CAD Father   . Hypertension Mother   . Peripheral Artery Disease Mother   . Rheum arthritis Mother   . Diabetes Neg Hx     DRUG ALLERGIES:   Allergies  Allergen Reactions  . Asa [Aspirin] Other (See Comments)    Reaction: swelling of the right side.  . Bee Venom Swelling  . Codeine Other (See Comments)    Reaction: Difficulty breathing  . Ibuprofen Other (See Comments)  Reaction: Swelling of the right side.    REVIEW OF SYSTEMS:  CONSTITUTIONAL: Positive for fever, chills and sweats at home.  Positive for fatigue.  EYES: No blurred or double vision.  Wears glasses. EARS, NOSE, AND THROAT: No tinnitus or ear pain. No sore throat.  No stopped up.  Occasional dysphasia. RESPIRATORY: Positive for cough, shortness of breath, and wheezing.  No hemoptysis.  CARDIOVASCULAR: No chest pain, orthopnea, edema.  GASTROINTESTINAL: No nausea, vomiting, diarrhea or abdominal pain. No blood in bowel movements GENITOURINARY: No dysuria, hematuria.  ENDOCRINE: No polyuria, nocturia,  HEMATOLOGY: No anemia, easy bruising or bleeding SKIN: No rash or lesion. MUSCULOSKELETAL: Hands cramping and pain NEUROLOGIC: No tingling, numbness, weakness.  PSYCHIATRY: History of anxiety.   MEDICATIONS AT HOME:   Prior to Admission medications    Medication Sig Start Date End Date Taking? Authorizing Provider  ALPRAZolam Duanne Moron) 1 MG tablet Take 1 tablet (1 mg total) by mouth 3 (three) times daily as needed for anxiety. 12/06/15  Yes Fritzi Mandes, MD  Cholecalciferol (VITAMIN D) 2000 units CAPS Take 2,000 Units by mouth daily.   Yes [provider]  escitalopram (LEXAPRO) 20 MG tablet Take 20 mg by mouth at bedtime.    Yes [provider]  furosemide (LASIX) 20 MG tablet Take 20 mg by mouth daily.    Yes [provider]  gabapentin (NEURONTIN) 300 MG capsule Take 300 mg 3 (three) times daily by mouth.    Yes [provider]  potassium chloride (K-DUR,KLOR-CON) 10 MEQ tablet Take 10 mEq by mouth once.   Yes [provider]  Probiotic Product (PROBIOTIC-10) CAPS Take 1 capsule by mouth 2 (two) times daily.    Yes [provider]  rOPINIRole (REQUIP) 0.25 MG tablet Take 1 tablet 3 (three) times daily by mouth.  01/04/17  Yes [provider]  theophylline (UNIPHYL) 400 MG 24 hr tablet Take 400 mg by mouth daily.    Yes [provider]  Tiotropium Bromide Monohydrate (SPIRIVA RESPIMAT) 2.5 MCG/ACT AERS Inhale 2 puffs into the lungs daily.    Yes [provider]  Albuterol Sulfate (PROAIR RESPICLICK) 948 (90 BASE) MCG/ACT AEPB Inhale 2 puffs into the lungs every 4 (four) hours as needed (for shortness of breath). Reported on 12/10/2015    [provider]  guaiFENesin (MUCINEX) 600 MG 12 hr tablet Take 1 tablet (600 mg total) by mouth 2 (two) times daily. 07/03/17   Fritzi Mandes, MD  ipratropium-albuterol (DUONEB) 0.5-2.5 (3) MG/3ML SOLN Take 3 mLs by nebulization 4 (four) times daily as needed (for shortness of breath).    [provider]  mometasone-formoterol (DULERA) 200-5 MCG/ACT AERO Inhale 1 puff into the lungs 2 (two) times daily.     [provider]  predniSONE (DELTASONE) 50 MG tablet Start 50 mg daily taper by 5 mg then stop Patient  not taking: Reported on 08/06/2017 07/03/17   Fritzi Mandes, MD      VITAL SIGNS:  Blood pressure (!) 105/54, pulse (!) 117, temperature 98.4 F (36.9 C), temperature source Oral, resp. rate (!) 24, height '5\' 10"'  (1.778 m), weight 95.3 kg (210 lb), SpO2 97 %.  PHYSICAL EXAMINATION:  GENERAL:  61 y.o.-year-old patient lying in the bed with no acute distress.  EYES: Pupils equal, round, reactive to light and accommodation. No scleral icterus. Extraocular muscles intact.  HEENT: Head atraumatic, normocephalic. Oropharynx and nasopharynx clear.  NECK:  Supple, no jugular venous distention. No thyroid enlargement, no tenderness.  LUNGS:  Decreased breath sounds bilaterally, poor air entry bilaterally.  Positive wheezing throughout entire lung field. No use of accessory muscles of respiration.  CARDIOVASCULAR: S1, S2  tachycardic. No murmurs, rubs, or gallops.  ABDOMEN: Soft, nontender, nondistended. Bowel sounds present. No organomegaly or mass.  EXTREMITIES: Trace edema, no cyanosis.  NEUROLOGIC: Cranial nerves II through XII are intact. Muscle strength 5/5 in all extremities. Sensation intact. Gait not checked.  PSYCHIATRIC: The patient is alert and oriented x 3.  SKIN: No rash, lesion, or ulcer.   LABORATORY PANEL:   CBC Recent Labs  Lab 08/06/17 1650  WBC 5.3  HGB 13.6  HCT 40.3  PLT 202   ------------------------------------------------------------------------------------------------------------------  Chemistries  Recent Labs  Lab 08/06/17 1650  NA 138  K 4.0  CL 99*  CO2 30  GLUCOSE 113*  BUN 10  CREATININE 1.27*  CALCIUM 9.1  AST 29  ALT 21  ALKPHOS 61  BILITOT 0.5   ------------------------------------------------------------------------------------------------------------------    RADIOLOGY:  Dg Chest 2 View  Result Date: 08/06/2017 CLINICAL DATA:  Shortness of breath.  Sepsis. EXAM: CHEST  2 VIEW COMPARISON:  06/28/2017 and 01/22/2017 FINDINGS: Heart size  is normal. The patient has severe chronic obstructive lung disease. There is crowding and prominence of the vessels of the lung bases as well some scarring at the lung bases. No consolidative infiltrates or effusions. No acute bone abnormality. IMPRESSION: No acute abnormality.  Severe emphysema. Electronically Signed   By: Lorriane Shire M.D.   On: 08/06/2017 17:17    EKG:   Sinus tachycardia 105 bpm, right bundle branch block, left anterior physical block, flipped T waves laterally  IMPRESSION AND PLAN:   1.  COPD exacerbation.  Patient must stop smoking.  Continue Solu-Medrol 40 mg daily given 125 mg in the ER.  Start budesonide nebulizers and DuoNeb neb solution.  Send off a pro-calcitonin so hopefully can not give antibiotics at this time since he had quite a few courses already.  Check theophylline level 2.  Chronic respiratory failure on 2 L of oxygen 3.  Chronic diastolic congestive heart failure.  Continue oral Lasix tomorrow morning. 4.  Anxiety depression.  PRN medication and Lexapro 5.  Tobacco abuse.  Smoking cessation counseling done 4 minutes by me. 6.  Hand cramping and pain.  Send off ANA, rheumatoid factor and ESR     All the records are reviewed and case discussed with ED provider. Management plans discussed with the patient, family and they are in agreement.  CODE STATUS: Full code  TOTAL TIME TAKING CARE OF THIS PATIENT: 50 minutes.    Loletha Grayer M.D on 08/06/2017 at 6:33 PM  Between 7am to 6pm - Pager - [phone]  After 6pm call admission pager [phone]  [facility] Office  [phone]  CC: Primary care physician; Lavera Guise, MD

## 2017-08-06 NOTE — Progress Notes (Signed)
Pts. Lactic acid came back as 2.7. Dr. Anne HahnWillis notified and ordered IV fluids and another lactic acid draw.

## 2017-08-06 NOTE — ED Notes (Signed)
Patient transported to X-ray 

## 2017-08-06 NOTE — ED Provider Notes (Signed)
[facility] Emergency Department Provider Note  ____________________________________________   First MD Initiated Contact with Patient 08/06/17 1643     (approximate)  I have reviewed the triage vital signs and the nursing notes.   HISTORY  Chief Complaint Shortness of Breath   [name] is a 61 y.o. male with a history of COPD as well as recent pneumonia on Bactrim at this time was presented to the emergency department today with shortness of breath.  He says that he also has a tightness that he feels around his chest.  Says that he was admitted to the hospital about 3 weeks ago with pneumonia and has been on Levaquin, finished the course of Levaquin and then been started on Bactrim by his primary care doctor.  He says that he has been coughing up yellow to green sputum persistently.  Worsening shortness of breath today which brought him into the emergency department for further evaluation.  Past Medical History:  Diagnosis Date  . Allergy   . Anxiety   . Anxiety   . Asthma   . Chronic diastolic CHF (congestive heart failure) (HCC)    a. echo 07/2013: EF 60-65%, DD, biatrial dilatation, Ao sclerosis, dilated RV, moderate pulmonary HTN, elevated CV and RA pressures; b. patient reported echo at Dr. Milta DeitersKhan's office 02/2015 - his office does not have record of him being a pt there c. echo 11/2015: EF 60-65%, Grade 1 DD, mod-severe pulm pressures  . Chronic respiratory failure (HCC)    a. on 2L via nasal cannula; b. secondary to COPD  . COPD (chronic obstructive pulmonary disease) (HCC)   . Depression   . Depression   . Depression   . Emphysema of lung (HCC)   . GERD (gastroesophageal reflux disease)   . Hypertension   . Personal history of tobacco use, presenting hazards to health 08/17/2015  . Tobacco abuse     Patient Active Problem List   Diagnosis Date Noted  . Acute on chronic respiratory failure (HCC) 06/28/2017  . Anxiety 06/22/2016  .  Essential hypertension 06/22/2016  . Acute respiratory failure with hypoxia (HCC) 03/25/2016  . Tobacco use disorder 03/25/2016  . Acute on chronic respiratory failure with hypercapnia (HCC)   . Endotracheally intubated   . Chest pain with low risk of acute coronary syndrome 11/30/2015  . Chronic diastolic heart failure (HCC) 11/30/2015  . Sinus tachycardia seen on cardiac monitor 11/30/2015  . Hypercapnic respiratory failure (HCC) 11/30/2015  . Pulmonary hypertension (HCC)   . Centrilobular emphysema (HCC)   . Lethargy   . Personal history of tobacco use, presenting hazards to health 08/17/2015  . Pressure ulcer 04/19/2015  . Acute on chronic diastolic CHF (congestive heart failure) (HCC)   . COPD exacerbation (HCC) 03/31/2015    Past Surgical History:  Procedure Laterality Date  . ABDOMINAL SURGERY    . ADENOIDECTOMY    . hemorrh    . NOSE SURGERY    . URETHRA SURGERY     surgery 6 times from age [age] yrs old    Prior to Admission medications   Medication Sig Start Date End Date Taking? Authorizing Provider  Albuterol Sulfate (PROAIR RESPICLICK) 108 (90 BASE) MCG/ACT AEPB Inhale 2 puffs into the lungs every 4 (four) hours as needed (for shortness of breath). Reported on 12/10/2015    [provider]  ALPRAZolam Prudy Feeler(XANAX) 1 MG tablet Take 1 tablet (1 mg total) by mouth 3 (three) times daily as needed for anxiety.  12/06/15   Enedina FinnerPatel, Sona, MD  Cholecalciferol (VITAMIN D) 2000 units CAPS Take 2,000 Units by mouth daily.    [provider]  diltiazem (CARDIZEM) 30 MG tablet Take 1 tablet (30 mg total) by mouth 3 (three) times daily as needed. 08/01/16   Antonieta IbaGollan, Timothy J, MD  escitalopram (LEXAPRO) 20 MG tablet Take 20 mg by mouth at bedtime.     [provider]  furosemide (LASIX) 20 MG tablet Take 20 mg by mouth daily.     [provider]  gabapentin (NEURONTIN) 300 MG capsule Take 300 mg by mouth 2 (two) times daily.     [provider]    guaiFENesin (MUCINEX) 600 MG 12 hr tablet Take 1 tablet (600 mg total) by mouth 2 (two) times daily. 07/03/17   Enedina FinnerPatel, Sona, MD  ipratropium-albuterol (DUONEB) 0.5-2.5 (3) MG/3ML SOLN Take 3 mLs by nebulization 4 (four) times daily as needed (for shortness of breath).    [provider]  mometasone-formoterol (DULERA) 200-5 MCG/ACT AERO Inhale 1 puff into the lungs 2 (two) times daily.     [provider]  potassium chloride (K-DUR,KLOR-CON) 10 MEQ tablet Take 10 mEq by mouth once.    [provider]  predniSONE (DELTASONE) 50 MG tablet Start 50 mg daily taper by 5 mg then stop 07/03/17   Enedina FinnerPatel, Sona, MD  [facility] HFA 108 [phone](90 Base) MCG/ACT inhaler Take 2 puffs by mouth every 6 (six) hours as needed. 05/25/17   [provider]  Probiotic Product (PROBIOTIC-10) CAPS Take 1 capsule by mouth 2 (two) times daily.     [provider]  rOPINIRole (REQUIP) 0.25 MG tablet Take 1 tablet by mouth 2 (two) times daily. 01/04/17   [provider]  theophylline (UNIPHYL) 400 MG 24 hr tablet Take 400 mg by mouth daily.     [provider]  Tiotropium Bromide Monohydrate (SPIRIVA RESPIMAT) 2.5 MCG/ACT AERS Inhale 2 puffs into the lungs daily.     [provider]    Allergies Asa [aspirin]; Bee venom; Codeine; and Ibuprofen  Family History  Problem Relation Age of Onset  . CAD Father   . Hypertension Mother   . Peripheral Artery Disease Mother   . Diabetes Neg Hx     Social History Social History   Tobacco Use  . Smoking status: Current Every Day Smoker    Packs/day: 0.10    Years: 43.00    Pack years: 4.30    Types: E-cigarettes  . Smokeless tobacco: Never Used  Substance Use Topics  . Alcohol use: No  . Drug use: No    Review of Systems  Constitutional: No fever/chills Eyes: No visual changes. ENT: No sore throat. Cardiovascular: As above Respiratory: As above Gastrointestinal: No abdominal pain.  No nausea, no  vomiting.  No diarrhea.   Genitourinary: Negative for dysuria. Musculoskeletal: Negative for back pain. Skin: Negative for rash. Neurological: Negative for headaches, focal weakness or numbness.   ____________________________________________   PHYSICAL EXAM:  VITAL SIGNS: ED Triage Vitals  Enc Vitals Group     BP 08/06/17 1630 (!) 105/54     Pulse Rate 08/06/17 1630 (!) 117     Resp 08/06/17 1630 (!) 24     Temp 08/06/17 1630 98.4 F (36.9 C)     Temp Source 08/06/17 1630 Oral     SpO2 08/06/17 1630 93 %     Weight 08/06/17 1633 210 lb (95.3 kg)     Height 08/06/17 1633 5'  10" (1.778 m)     Head Circumference --      Peak Flow --      Pain Score 08/06/17 1630 0     Pain Loc --      Pain Edu? --      Excl. in GC? --     Constitutional: Alert and oriented. Well appearing and in no acute distress. Eyes: Conjunctivae are normal.  Head: Atraumatic. Nose: No congestion/rhinnorhea. Mouth/Throat: Mucous membranes are moist.  Neck: No stridor.   Cardiovascular: Tachycardic, regular rhythm. Grossly normal heart sounds.   Respiratory: Tachypneic with severely decreased air movement throughout with coarse expiratory wheezing and expiratory cough.  Patient able to speak in full sentences. Gastrointestinal: Soft and nontender. No distention. Musculoskeletal: No lower extremity tenderness nor edema.  No joint effusions. Neurologic:  Normal speech and language. No gross focal neurologic deficits are appreciated. Skin:  Skin is warm, dry and intact. No rash noted. Psychiatric: Mood and affect are normal. Speech and behavior are normal.  ____________________________________________   LABS (all labs ordered are listed, but only abnormal results are displayed)  Labs Reviewed  COMPREHENSIVE METABOLIC PANEL - Abnormal; Notable for the following components:      Result Value   Chloride 99 (*)    Glucose, Bld 113 (*)    Creatinine, Ser 1.27 (*)    GFR calc non Af Amer 58 (*)    All  other components within normal limits  CBC WITH DIFFERENTIAL/PLATELET - Abnormal; Notable for the following components:   RBC 4.15 (*)    RDW 14.9 (*)    All other components within normal limits  CULTURE, BLOOD (ROUTINE X 2)  CULTURE, BLOOD (ROUTINE X 2)  LACTIC ACID, PLASMA  PROTIME-INR  LACTIC ACID, PLASMA  URINALYSIS, COMPLETE (UACMP) WITH MICROSCOPIC   ____________________________________________  EKG  ED ECG REPORT I, Arelia Longest, the attending physician, personally viewed and interpreted this ECG.   Date: 08/06/2017  EKG Time: 1650  Rate: 105  Rhythm: sinus tachycardia  Axis: Normal  Intervals: Right bundle branch block and left anterior fascicular block  ST&T Change: No ST segment elevation or depression.  T wave inversions in V2 and V3. No significant change from EKG on 28 June 2017. ____________________________________________  RADIOLOGY  Severe emphysema without signs of pneumonia. ____________________________________________   PROCEDURES  Procedure(s) performed:   Procedures  Critical Care performed:   ____________________________________________   INITIAL IMPRESSION / ASSESSMENT AND PLAN / ED COURSE  Pertinent labs & imaging results that were available during my care of the patient were reviewed by me and considered in my medical decision making (see chart for details).  Differential includes, but is not limited to, viral syndrome, bronchitis including COPD exacerbation, pneumonia, reactive airway disease including asthma, CHF including exacerbation with or without pulmonary/interstitial edema, pneumothorax, ACS, thoracic trauma, and pulmonary embolism.  As part of my medical decision making, I reviewed the following data within the electronic MEDICAL RECORD NUMBERRecent admission reviewed     ----------------------------------------- 6:05 PM on 08/06/2017 -----------------------------------------  After 3 DuoNeb treatments as well as  steroids the patient is still with severely decreased air movement although it is slightly improved.  Still with expiratory cough.  Will be admitted to the hospital.  Patient aware of the plan for admission.  Signed out to Dr. Hilton Sinclair of the hospitalist service.  ____________________________________________   FINAL CLINICAL IMPRESSION(S) / ED DIAGNOSES  COPD exacerbation.    NEW MEDICATIONS STARTED DURING THIS VISIT:  This SmartLink is deprecated.  Use AVSMEDLIST instead to display the medication list for a patient.   Note:  This document was prepared using Dragon voice recognition software and may include unintentional dictation errors.     Myrna Blazer, MD 08/06/17 [phone]

## 2017-08-06 NOTE — ED Notes (Signed)
Pt given sandwich tray and water 

## 2017-08-06 NOTE — Progress Notes (Signed)
Patient lactic acid 2.7.. MD paged

## 2017-08-07 LAB — CBC
HEMATOCRIT: 39.3 % — AB (ref 40.0–52.0)
Hemoglobin: 13.2 g/dL (ref 13.0–18.0)
MCH: 32.5 pg (ref 26.0–34.0)
MCHC: 33.5 g/dL (ref 32.0–36.0)
MCV: 97.1 fL (ref 80.0–100.0)
PLATELETS: 192 10*3/uL (ref 150–440)
RBC: 4.05 MIL/uL — AB (ref 4.40–5.90)
RDW: 14.7 % — AB (ref 11.5–14.5)
WBC: 4.4 10*3/uL (ref 3.8–10.6)

## 2017-08-07 LAB — PROCALCITONIN

## 2017-08-07 LAB — LACTIC ACID, PLASMA: LACTIC ACID, VENOUS: 1.7 mmol/L (ref 0.5–1.9)

## 2017-08-07 LAB — BASIC METABOLIC PANEL
Anion gap: 8 (ref 5–15)
BUN: 13 mg/dL (ref 6–20)
CHLORIDE: 104 mmol/L (ref 101–111)
CO2: 28 mmol/L (ref 22–32)
CREATININE: 1.3 mg/dL — AB (ref 0.61–1.24)
Calcium: 8.8 mg/dL — ABNORMAL LOW (ref 8.9–10.3)
GFR calc Af Amer: >60 mL/min (ref >60)
GFR calc non Af Amer: 57 mL/min — ABNORMAL LOW (ref >60)
Glucose, Bld: 227 mg/dL — ABNORMAL HIGH (ref 65–99)
POTASSIUM: 4.8 mmol/L (ref 3.5–5.1)
SODIUM: 140 mmol/L (ref 135–145)

## 2017-08-07 LAB — SEDIMENTATION RATE: Sed Rate: 12 mm/hr (ref 0–20)

## 2017-08-07 LAB — THEOPHYLLINE LEVEL: Theophylline Lvl: 5 ug/mL — ABNORMAL LOW (ref 10.0–20.0)

## 2017-08-07 MED ORDER — ALPRAZOLAM 1 MG PO TABS
1.0000 mg | ORAL_TABLET | Freq: Three times a day (TID) | ORAL | Status: DC | PRN
  Administered 2017-08-07 – 2017-08-14 (×22): 1 mg via ORAL
  Filled 2017-08-07 (×24): qty 1

## 2017-08-07 MED ORDER — METHYLPREDNISOLONE SODIUM SUCC 40 MG IJ SOLR
40.0000 mg | Freq: Two times a day (BID) | INTRAMUSCULAR | Status: DC
  Administered 2017-08-07 – 2017-08-09 (×4): 40 mg via INTRAVENOUS
  Filled 2017-08-07 (×4): qty 1

## 2017-08-07 NOTE — Progress Notes (Signed)
[facility] [place] NAME: Dustin [name]    MR#:  [id number]  DATE OF BIRTH:  19-Mar-1956  SUBJECTIVE:  CHIEF COMPLAINT: Shortness of breath is slightly better.  He reports that he takes Xanax 1 mg 3 times a day at home requesting to resume it, still smoking but 3 cigarettes/day  REVIEW OF SYSTEMS:  CONSTITUTIONAL: No fever, fatigue or weakness.  EYES: No blurred or double vision.  EARS, NOSE, AND THROAT: No tinnitus or ear pain.  RESPIRATORY: No cough, shortness of breath slightly better, denies wheezing or hemoptysis.  CARDIOVASCULAR: No chest pain, orthopnea, edema.  GASTROINTESTINAL: No nausea, vomiting, diarrhea or abdominal pain.  GENITOURINARY: No dysuria, hematuria.  ENDOCRINE: No polyuria, nocturia,  HEMATOLOGY: No anemia, easy bruising or bleeding SKIN: No rash or lesion. MUSCULOSKELETAL: No joint pain or arthritis.   NEUROLOGIC: No tingling, numbness, weakness.  PSYCHIATRY: No anxiety or depression.   DRUG ALLERGIES:   Allergies  Allergen Reactions  . Asa [Aspirin] Other (See Comments)    Reaction: swelling of the right side.  . Bee Venom Swelling  . Codeine Other (See Comments)    Reaction: Difficulty breathing  . Ibuprofen Other (See Comments)    Reaction: Swelling of the right side.    VITALS:  Blood pressure 125/69, pulse (!) 102, temperature 98.5 F (36.9 C), temperature source Oral, resp. rate (!) 24, height '5\' 10"'  (1.778 m), weight 95.3 kg (210 lb), SpO2 95 %.  PHYSICAL EXAMINATION:  GENERAL:  61 y.o.-year-old patient lying in the bed with no acute distress.  EYES: Pupils equal, round, reactive to light and accommodation. No scleral icterus. Extraocular muscles intact.  HEENT: Head atraumatic, normocephalic. Oropharynx and nasopharynx clear.  NECK:  Supple, no jugular venous distention. No thyroid enlargement, no tenderness.  LUNGS: mod breath sounds bilaterally, no wheezing, rales,rhonchi or  crepitation. No use of accessory muscles of respiration.  CARDIOVASCULAR: S1, S2 normal. No murmurs, rubs, or gallops.  ABDOMEN: Soft, nontender, nondistended. Bowel sounds present. No organomegaly or mass.  EXTREMITIES: No pedal edema, cyanosis, or clubbing.  NEUROLOGIC: Cranial nerves II through XII are intact. Muscle strength 5/5 in all extremities. Sensation intact. Gait not checked.  PSYCHIATRIC: The patient is alert and oriented x 3.  SKIN: No obvious rash, lesion, or ulcer.    LABORATORY PANEL:   CBC Recent Labs  Lab 08/07/17 0008  WBC 4.4  HGB 13.2  HCT 39.3*  PLT 192   ------------------------------------------------------------------------------------------------------------------  Chemistries  Recent Labs  Lab 08/06/17 1650 08/07/17 0008  NA 138 140  K 4.0 4.8  CL 99* 104  CO2 30 28  GLUCOSE 113* 227*  BUN 10 13  CREATININE 1.27* 1.30*  CALCIUM 9.1 8.8*  AST 29  --   ALT 21  --   ALKPHOS 61  --   BILITOT 0.5  --    ------------------------------------------------------------------------------------------------------------------  Cardiac Enzymes No results for input(s): TROPONINI in the last 168 hours. ------------------------------------------------------------------------------------------------------------------  RADIOLOGY:  Dg Chest 2 View  Result Date: 08/06/2017 CLINICAL DATA:  Shortness of breath.  Sepsis. EXAM: CHEST  2 VIEW COMPARISON:  06/28/2017 and 01/22/2017 FINDINGS: Heart size is normal. The patient has severe chronic obstructive lung disease. There is crowding and prominence of the vessels of the lung bases as well some scarring at the lung bases. No consolidative infiltrates or effusions. No acute bone abnormality. IMPRESSION: No acute abnormality.  Severe emphysema. Electronically Signed   By: Lorriane Shire M.D.   On:  08/06/2017 17:17    EKG:   Orders placed or performed during the hospital encounter of 06/28/17  . ED EKG  . ED EKG   . EKG 12-Lead  . EKG 12-Lead  . EKG    ASSESSMENT AND PLAN:    1.  COPD exacerbation. Continue Solu-Medrol and bronchodilator therapy with DuoNeb's  Patient must stop smoking.    Pro-calcitonin less than 0.10 not considering any antibiotics . theophylline level 5.0, below normal   2.  Chronic respiratory failure on 2 L of oxygen  3.  Chronic diastolic congestive heart failure.  Continue oral Lasix   4.  Anxiety depression.  PRN medication and Lexapro  5.  Tobacco abuse.  Smoking cessation counseling done 4 minutes by me.  6.  Hand cramping and pain.  Send off ANA, rheumatoid factor and normal ESR      All the records are reviewed and case discussed with Care Management/Social Workerr. Management plans discussed with the patient, family and they are in agreement.  CODE STATUS: fc  TOTAL TIME TAKING CARE OF THIS PATIENT: 36 minutes.   POSSIBLE D/C IN 1-2 DAYS, DEPENDING ON CLINICAL CONDITION.  Note: This dictation was prepared with Dragon dictation along with smaller phrase technology. Any transcriptional errors that result from this process are unintentional.   Nicholes Mango M.D on 08/07/2017 at 1:59 PM  Between 7am to 6pm - Pager - [phone] After 6pm go to [email or web address] - password [email or web address] [facility]  [place] [facility]  Office  [phone]  CC: Primary care physician; Lavera Guise, MD

## 2017-08-07 NOTE — Care Management Note (Addendum)
Case Management Note  Patient Details  Name: Dustin CoriaDennis R [name] MRN: [id number] Date of Birth: 1956-03-29  Subjective/Objective: [facility] is [facility] by [facility] for RN, PT, OT. Marland Kitchen. [name] notified of admission. RNCM to follow and assist as needed.                    Action/Plan:   Expected Discharge Date:  08/08/17               Expected Discharge Plan:     In-House Referral:     Discharge planning Services  CM Consult  Post Acute Care Choice:  Resumption of Svcs/PTA Provider Choice offered to:     DME Arranged:    DME Agency:     HH Arranged:  RN, PT, OT HH Agency:  [name] ([facility])  Status of Service:  In process, will continue to follow  If discussed at Long Length of Stay Meetings, dates discussed:    Additional Comments:  Dustin MemosLisa M Zayne Marovich, RN 08/07/2017, 4:00 PM

## 2017-08-07 NOTE — Progress Notes (Signed)
Pt states his xanex should be 1mg  tid prn.  Per Dr. Amado CoeGouru, confirm with pharmacy and change medication dose as prescribed.  Updated info with [facility] and pt xanex is ordered at 1mg  tid prn.

## 2017-08-08 LAB — PROCALCITONIN

## 2017-08-08 LAB — RHEUMATOID FACTOR: Rhuematoid fact SerPl-aCnc: 11.9 IU/mL (ref 0.0–13.9)

## 2017-08-08 LAB — ANA W/REFLEX IF POSITIVE: Anti Nuclear Antibody(ANA): NEGATIVE

## 2017-08-08 LAB — HIV ANTIBODY (ROUTINE TESTING W REFLEX): HIV SCREEN 4TH GENERATION: NONREACTIVE

## 2017-08-08 MED ORDER — SALINE SPRAY 0.65 % NA SOLN
1.0000 | NASAL | Status: DC | PRN
Start: 2017-08-08 — End: 2017-08-14
  Administered 2017-08-10: 1 via NASAL
  Filled 2017-08-08: qty 44

## 2017-08-08 NOTE — Progress Notes (Signed)
Dustin [name] at [place] NAME: Dustin [name]    MR#:  [id number]  DATE OF BIRTH:  March 18, 1956  SUBJECTIVE:  CHIEF COMPLAINT: Shortness of breath is slightly better.  Still feeling tight in his chest, still smoking but 3 cigarettes/day  REVIEW OF SYSTEMS:  CONSTITUTIONAL: No fever, fatigue or weakness.  EYES: No blurred or double vision.  EARS, NOSE, AND THROAT: No tinnitus or ear pain.  RESPIRATORY: No cough, shortness of breath slightly better, denies wheezing or hemoptysis.  CARDIOVASCULAR: No chest pain, orthopnea, edema.  GASTROINTESTINAL: No nausea, vomiting, diarrhea or abdominal pain.  GENITOURINARY: No dysuria, hematuria.  ENDOCRINE: No polyuria, nocturia,  HEMATOLOGY: No anemia, easy bruising or bleeding SKIN: No rash or lesion. MUSCULOSKELETAL: No joint pain or arthritis.   NEUROLOGIC: No tingling, numbness, weakness.  PSYCHIATRY: No anxiety or depression.   DRUG ALLERGIES:   Allergies  Allergen Reactions  . Asa [Aspirin] Other (See Comments)    Reaction: swelling of the right side.  . Bee Venom Swelling  . Codeine Other (See Comments)    Reaction: Difficulty breathing  . Ibuprofen Other (See Comments)    Reaction: Swelling of the right side.    VITALS:  Blood pressure 111/67, pulse 100, temperature 98.9 F (37.2 C), temperature source Oral, resp. rate 18, height 5' 10" (1.778 m), weight 95.3 kg (210 lb), SpO2 92 %.  PHYSICAL EXAMINATION:  GENERAL:  61 y.o.-year-old patient lying in the bed with no acute distress.  EYES: Pupils equal, round, reactive to light and accommodation. No scleral icterus. Extraocular muscles intact.  HEENT: Head atraumatic, normocephalic. Oropharynx and nasopharynx clear.  NECK:  Supple, no jugular venous distention. No thyroid enlargement, no tenderness.  LUNGS: mod breath sounds bilaterally, minimal diffuse wheezing, no rales,rhonchi or crepitation. No use of accessory muscles of  respiration.  CARDIOVASCULAR: S1, S2 normal. No murmurs, rubs, or gallops.  ABDOMEN: Soft, nontender, nondistended. Bowel sounds present. No organomegaly or mass.  EXTREMITIES: No pedal edema, cyanosis, or clubbing.  NEUROLOGIC: Cranial nerves II through XII are intact. Muscle strength 5/5 in all extremities. Sensation intact. Gait not checked.  PSYCHIATRIC: The patient is alert and oriented x 3.  SKIN: No obvious rash, lesion, or ulcer.    LABORATORY PANEL:   CBC Recent Labs  Lab 08/07/17 0008  WBC 4.4  HGB 13.2  HCT 39.3*  PLT 192   ------------------------------------------------------------------------------------------------------------------  Chemistries  Recent Labs  Lab 08/06/17 1650 08/07/17 0008  NA 138 140  K 4.0 4.8  CL 99* 104  CO2 30 28  GLUCOSE 113* 227*  BUN 10 13  CREATININE 1.27* 1.30*  CALCIUM 9.1 8.8*  AST 29  --   ALT 21  --   ALKPHOS 61  --   BILITOT 0.5  --    ------------------------------------------------------------------------------------------------------------------  Cardiac Enzymes No results for input(s): TROPONINI in the last 168 hours. ------------------------------------------------------------------------------------------------------------------  RADIOLOGY:  Dg Chest 2 View  Result Date: 08/06/2017 CLINICAL DATA:  Shortness of breath.  Sepsis. EXAM: CHEST  2 VIEW COMPARISON:  06/28/2017 and 01/22/2017 FINDINGS: Heart size is normal. The patient has severe chronic obstructive lung disease. There is crowding and prominence of the vessels of the lung bases as well some scarring at the lung bases. No consolidative infiltrates or effusions. No acute bone abnormality. IMPRESSION: No acute abnormality.  Severe emphysema. Electronically Signed   By: Lorriane Shire M.D.   On: 08/06/2017 17:17    EKG:   Orders placed or performed  during the hospital encounter of 06/28/17  . ED EKG  . ED EKG  . EKG 12-Lead  . EKG 12-Lead  . EKG     ASSESSMENT AND PLAN:    1.  COPD exacerbation. Continue Solu-Medrol and bronchodilator therapy with DuoNeb's  Patient must stop smoking.    Pro-calcitonin less than 0.10 not considering any antibiotics . theophylline level 5.0, below normal  Incentive spirometry Ambulate patient and check ambulatory pulse ox  blood cultures negative. Pro Calcitonin level is back to normal  2.  Chronic respiratory failure on 2 L of oxygen  3.  Chronic diastolic congestive heart failure.  Continue oral Lasix   4.  Anxiety depression.  PRN medication and Lexapro  5.  Tobacco abuse.  Smoking cessation counseling done 4 minutes by me.  6.  Hand cramping and pain.    Negative ANA, rheumatoid factor and normal ESR      All the records are reviewed and case discussed with Care Management/Social Workerr. Management plans discussed with the patient, family and they are in agreement.  CODE STATUS: fc  TOTAL TIME TAKING CARE OF THIS PATIENT: 36 minutes.   POSSIBLE D/C IN 1-2 DAYS, DEPENDING ON CLINICAL CONDITION.  Note: This dictation was prepared with Dragon dictation along with smaller phrase technology. Any transcriptional errors that result from this process are unintentional.   Dustin [name] M.D on 08/08/2017 at 4:45 PM  Between 7am to 6pm - Pager - [phone] After 6pm go to [email or web address] - password [email or web address] [facility]  [place] [facility]  Office  [phone]  CC: Primary care physician; Lavera Guise, MD

## 2017-08-08 NOTE — Progress Notes (Signed)
Spoke with patient regarding CPAP. He declined use of hospital machine and prefers to have family member bring his from home. Advised patient that hospital will provide a machine for him to use if he changes his mind.

## 2017-08-08 NOTE — Care Management Note (Signed)
Case Management Note  Patient Details  Name: Hershal CoriaDennis R Albor MRN: [id number] Date of Birth: 1956-08-04  Subjective/Objective: Discharging today                  Action/Plan: Notified [name] of discharge. Attending to place resumption of care orders  Expected Discharge Date:  08/08/17               Expected Discharge Plan:  Home w Home Health Services  In-House Referral:     Discharge planning Services  CM Consult  Post Acute Care Choice:  Resumption of Svcs/PTA Provider Choice offered to:     DME Arranged:    DME Agency:     HH Arranged:  RN, PT, OT HH Agency[facility]  [name]  Status of Service:  Completed, signed off  If discussed at [employer] Meetings, dates discussed:    Additional Comments:  Marily MemosLisa M Sheryle Vice, RN 08/08/2017, 10:55 AM

## 2017-08-09 MED ORDER — METHYLPREDNISOLONE SODIUM SUCC 40 MG IJ SOLR
40.0000 mg | Freq: Four times a day (QID) | INTRAMUSCULAR | Status: DC
  Administered 2017-08-09 – 2017-08-12 (×12): 40 mg via INTRAVENOUS
  Filled 2017-08-09 (×12): qty 1

## 2017-08-09 NOTE — Progress Notes (Signed)
[place] at [place] NAME: Dustin [name]    MR#:  [id number]  DATE OF BIRTH:  July 10, 1956  SUBJECTIVE:  CHIEF COMPLAINT:  Still wheezing and feeling tight in his chest, still smoking but 3 cigarettes/day  REVIEW OF SYSTEMS:  CONSTITUTIONAL: No fever, fatigue or weakness.  EYES: No blurred or double vision.  EARS, NOSE, AND THROAT: No tinnitus or ear pain.  RESPIRATORY: No cough, shortness of breath slightly better, denies wheezing or hemoptysis.  CARDIOVASCULAR: No chest pain, orthopnea, edema.  GASTROINTESTINAL: No nausea, vomiting, diarrhea or abdominal pain.  GENITOURINARY: No dysuria, hematuria.  ENDOCRINE: No polyuria, nocturia,  HEMATOLOGY: No anemia, easy bruising or bleeding SKIN: No rash or lesion. MUSCULOSKELETAL: No joint pain or arthritis.   NEUROLOGIC: No tingling, numbness, weakness.  PSYCHIATRY: No anxiety or depression.   DRUG ALLERGIES:   Allergies  Allergen Reactions  . Asa [Aspirin] Other (See Comments)    Reaction: swelling of the right side.  . Bee Venom Swelling  . Codeine Other (See Comments)    Reaction: Difficulty breathing  . Ibuprofen Other (See Comments)    Reaction: Swelling of the right side.    VITALS:  Blood pressure 124/83, pulse 73, temperature 98.1 F (36.7 C), temperature source Oral, resp. rate 20, height _0  (1.778 m), weight 95.3 kg (210 lb), SpO2 93 %.  PHYSICAL EXAMINATION:  GENERAL:  61 y.o.-year-old patient lying in the bed with no acute distress.  EYES: Pupils equal, round, reactive to light and accommodation. No scleral icterus. Extraocular muscles intact.  HEENT: Head atraumatic, normocephalic. Oropharynx and nasopharynx clear.  NECK:  Supple, no jugular venous distention. No thyroid enlargement, no tenderness.  LUNGS: mod breath sounds bilaterally, minimal diffuse wheezing, no rales,rhonchi or crepitation. No use of accessory muscles of respiration.  CARDIOVASCULAR: S1,  S2 normal. No murmurs, rubs, or gallops.  ABDOMEN: Soft, nontender, nondistended. Bowel sounds present. No organomegaly or mass.  EXTREMITIES: No pedal edema, cyanosis, or clubbing.  NEUROLOGIC: Cranial nerves II through XII are intact. Muscle strength 5/5 in all extremities. Sensation intact. Gait not checked.  PSYCHIATRIC: The patient is alert and oriented x 3.  SKIN: No obvious rash, lesion, or ulcer.    LABORATORY PANEL:   CBC Recent Labs  Lab 08/07/17 0008  WBC 4.4  HGB 13.2  HCT 39.3*  PLT 192   ------------------------------------------------------------------------------------------------------------------  Chemistries  Recent Labs  Lab 08/06/17 1650 08/07/17 0008  NA 138 140  K 4.0 4.8  CL 99* 104  CO2 30 28  GLUCOSE 113* 227*  BUN 10 13  CREATININE 1.27* 1.30*  CALCIUM 9.1 8.8*  AST 29  --   ALT 21  --   ALKPHOS 61  --   BILITOT 0.5  --    ------------------------------------------------------------------------------------------------------------------  Cardiac Enzymes No results for input(s): TROPONINI in the last 168 hours. ------------------------------------------------------------------------------------------------------------------  RADIOLOGY:  No results found.  EKG:   Orders placed or performed during the hospital encounter of 06/28/17  . ED EKG  . ED EKG  . EKG 12-Lead  . EKG 12-Lead  . EKG    ASSESSMENT AND PLAN:    1.  COPD exacerbation. Very slow progress, will consider pulmonology consult  Continue and increase frequency of Solu-Medrol and bronchodilator therapy with DuoNeb's  Patient must stop smoking.    Pro-calcitonin less than 0.10 not considering any antibiotics . theophylline level 5.0, below normal  Incentive spirometry Ambulate patient and check ambulatory pulse ox  blood cultures  negative. Pro Calcitonin level is back to normal  2.  Chronic respiratory failure on 2 L of oxygen  3.  Chronic diastolic congestive  heart failure.  Continue oral Lasix   4.  Anxiety depression.  PRN medication and Lexapro  5.  Tobacco abuse.  Smoking cessation counseling done 4 minutes by me.  6.  Hand cramping and pain.    Negative ANA, rheumatoid factor and normal ESR    PT consult  All the records are reviewed and case discussed with Care Management/Social Workerr. Management plans discussed with the patient, family and they are in agreement.  CODE STATUS: fc  TOTAL TIME TAKING CARE OF THIS PATIENT: 36 minutes.   POSSIBLE D/C IN 1-2 DAYS, DEPENDING ON CLINICAL CONDITION.  Note: This dictation was prepared with Dragon dictation along with smaller phrase technology. Any transcriptional errors that result from this process are unintentional.   Nicholes Mango M.D on 08/09/2017 at 12:23 PM  Between 7am to 6pm - Pager - [phone] After 6pm go to [email or web address] - password [email or web address] [facility]  [facility] [facility]  Office  [phone]  CC: Primary care physician; Lavera Guise, MD

## 2017-08-10 NOTE — Progress Notes (Signed)
Pt still remaining alert and oriented. 2L of chronic oxygen. No complaints of pain this shift. Still with some tightness in chest with exertion. Pt able to sleep in between care.

## 2017-08-10 NOTE — Progress Notes (Signed)
[place] at [place] NAME: Dustin [name]    MR#:  [id number]  DATE OF BIRTH:  Sep 30, 1955  SUBJECTIVE:  CHIEF COMPLAINT:  no wheezing , family at bedside still smoking but 3 cigarettes/day  REVIEW OF SYSTEMS:  CONSTITUTIONAL: No fever, fatigue or weakness.  EYES: No blurred or double vision.  EARS, NOSE, AND THROAT: No tinnitus or ear pain.  RESPIRATORY: No cough, shortness of breath slightly better, denies wheezing or hemoptysis.  CARDIOVASCULAR: No chest pain, orthopnea, edema.  GASTROINTESTINAL: No nausea, vomiting, diarrhea or abdominal pain.  GENITOURINARY: No dysuria, hematuria.  ENDOCRINE: No polyuria, nocturia,  HEMATOLOGY: No anemia, easy bruising or bleeding SKIN: No rash or lesion. MUSCULOSKELETAL: No joint pain or arthritis.   NEUROLOGIC: No tingling, numbness, weakness.  PSYCHIATRY: No anxiety or depression.   DRUG ALLERGIES:   Allergies  Allergen Reactions  . Asa [Aspirin] Other (See Comments)    Reaction: swelling of the right side.  . Bee Venom Swelling  . Codeine Other (See Comments)    Reaction: Difficulty breathing  . Ibuprofen Other (See Comments)    Reaction: Swelling of the right side.    VITALS:  Blood pressure (!) 122/100, pulse (!) 107, temperature 98.2 F (36.8 C), temperature source Oral, resp. rate 18, height '5\' 10"'  (1.778 m), weight 95.3 kg (210 lb), SpO2 91 %.  PHYSICAL EXAMINATION:  GENERAL:  61 y.o.-year-old patient lying in the bed with no acute distress.  EYES: Pupils equal, round, reactive to light and accommodation. No scleral icterus. Extraocular muscles intact.  HEENT: Head atraumatic, normocephalic. Oropharynx and nasopharynx clear.  NECK:  Supple, no jugular venous distention. No thyroid enlargement, no tenderness.  LUNGS: mod breath sounds bilaterally, minimal diffuse wheezing, no rales,rhonchi or crepitation. No use of accessory muscles of respiration.  CARDIOVASCULAR: S1, S2  normal. No murmurs, rubs, or gallops.  ABDOMEN: Soft, nontender, nondistended. Bowel sounds present. No organomegaly or mass.  EXTREMITIES: No pedal edema, cyanosis, or clubbing.  NEUROLOGIC: Cranial nerves II through XII are intact. Muscle strength 5/5 in all extremities. Sensation intact. Gait not checked.  PSYCHIATRIC: The patient is alert and oriented x 3.  SKIN: No obvious rash, lesion, or ulcer.    LABORATORY PANEL:   CBC Recent Labs  Lab 08/07/17 0008  WBC 4.4  HGB 13.2  HCT 39.3*  PLT 192   ------------------------------------------------------------------------------------------------------------------  Chemistries  Recent Labs  Lab 08/06/17 1650 08/07/17 0008  NA 138 140  K 4.0 4.8  CL 99* 104  CO2 30 28  GLUCOSE 113* 227*  BUN 10 13  CREATININE 1.27* 1.30*  CALCIUM 9.1 8.8*  AST 29  --   ALT 21  --   ALKPHOS 61  --   BILITOT 0.5  --    ------------------------------------------------------------------------------------------------------------------  Cardiac Enzymes No results for input(s): TROPONINI in the last 168 hours. ------------------------------------------------------------------------------------------------------------------  RADIOLOGY:  No results found.  EKG:   Orders placed or performed during the hospital encounter of 06/28/17  . ED EKG  . ED EKG  . EKG 12-Lead  . EKG 12-Lead  . EKG    ASSESSMENT AND PLAN:    1.  COPD exacerbation. Very slow progress,  Pending pulmonology consult  Dyspneic with minimal exertion Continue and increase frequency of Solu-Medrol and bronchodilator therapy with DuoNeb's  Patient must stop smoking.    Pro-calcitonin less than 0.10 not considering any antibiotics . theophylline level 5.0, below normal  Incentive spirometry Ambulate patient and check ambulatory pulse  ox  blood cultures negative. Pro Calcitonin level is back to normal  2.  Chronic respiratory failure on 2 L of oxygen  3.   Chronic diastolic congestive heart failure.  Continue oral Lasix   4.  Anxiety depression.  PRN medication and Lexapro  5.  Tobacco abuse.  Smoking cessation counseling done 4 minutes by me.  6.  Hand cramping and pain.    Negative ANA, rheumatoid factor and normal ESR    PT consult  All the records are reviewed and case discussed with Care Management/Social Workerr. Management plans discussed with the patient, family and they are in agreement.  CODE STATUS: fc  TOTAL TIME TAKING CARE OF THIS PATIENT: 36 minutes.   POSSIBLE D/C IN 1-2 DAYS, DEPENDING ON CLINICAL CONDITION.  Note: This dictation was prepared with Dragon dictation along with smaller phrase technology. Any transcriptional errors that result from this process are unintentional.   Nicholes Mango M.D on 08/10/2017 at 4:13 PM  Between 7am to 6pm - Pager - [phone] After 6pm go to [email or web address] - password [email or web address] [facility]  [place] [facility]  Office  [phone]  CC: Primary care physician; Lavera Guise, MD

## 2017-08-10 NOTE — Progress Notes (Signed)
Pt does not want to use a Cpap machine. Hospital CPAP made available if he wants to use.

## 2017-08-10 NOTE — Evaluation (Signed)
Physical Therapy Evaluation Patient Details Name: Dustin CoriaDennis R Marolf MRN: [id number] DOB: 07-01-1956 Today's Date: 08/10/2017   History of Present Illness  61 y/o male who was here 3 weeks ago with SOB/COPD exacerbation.  Returns with same.   Clinical Impression  Pt showed great effort with PT exam, but was significantly limited with activity tolerance.  He in on 2 liters at baseline, at rest in bed on arrival his sats were in the high 80s on 2 liters and he was unable to get them higher than 90%.  During ambulation we used 3 liters and his sats still dropped to the low 80s and he had significant fatigue.  He also had HR increase to 130s with the effort and needed a seated rest break after ~30 ft of ambulation and attempting steps.  He showed good effort and willingness to participate but ultimately is very limited and not at all near his baseline.     Follow Up Recommendations Home health PT    Equipment Recommendations       Recommendations for Other Services       Precautions / Restrictions Restrictions Weight Bearing Restrictions: No      Mobility  Bed Mobility Overal bed mobility: Modified Independent                Transfers Overall transfer level: Modified independent Equipment used: None             General transfer comment: Pt able to rise to standing w/o assist, needed some time at EOB to prep  Ambulation/Gait Ambulation/Gait assistance: Min guard Ambulation Distance (Feet): 50 Feet Assistive device: None       General Gait Details: Pt with slow but safe ambulation.  Cautious gait and quick to fatigue.  He needed a seated rest break half way through and even on 3 liters his O2 went down to 83% and his HR increased to the 130s.  Pt is not at his baseline regarding activity tolerance and O2 demand  Stairs Stairs: Yes Stairs assistance: Min guard Stair Management: No rails Number of Stairs: 4 General stair comments: Pt was able to negotiate steps but  needed occasional rail use as he was not confident.  Reports his daughter is almost always with him as he leaves/enters the home  Wheelchair Mobility    Modified Rankin (Stroke Patients Only)       Balance Overall balance assessment: Modified Independent;Needs assistance Sitting-balance support: No upper extremity supported Sitting balance-Leahy Scale: Good     Standing balance support: No upper extremity supported Standing balance-Leahy Scale: Fair Standing balance comment: Pt able to maintain balance, but lacks confidence with any dynamic acitity                             Pertinent Vitals/Pain Pain Assessment: No/denies pain    Home Living Family/patient expects to be discharged to:: Private residence Living Arrangements: Children Available Help at Discharge: Family Type of Home: House Home Access: Stairs to enter Entrance Stairs-Rails: None Entrance Stairs-Number of Steps: 3  Home Layout: One level Home Equipment: Environmental consultantWalker - 2 wheels;Shower seat;Grab bars - tub/shower;Walker - 4 wheels      Prior Function Level of Independence: Independent         Comments: Indep with ADLs, household and limited community distances; home O2 at 2-3L. Denies fall history.     Hand Dominance        Extremity/Trunk Assessment   Upper  Extremity Assessment Upper Extremity Assessment: Overall WFL for tasks assessed    Lower Extremity Assessment Lower Extremity Assessment: Overall WFL for tasks assessed       Communication   Communication: No difficulties  Cognition Arousal/Alertness: Awake/alert Behavior During Therapy: WFL for tasks assessed/performed Overall Cognitive Status: Within Functional Limits for tasks assessed                                 General Comments: Pt is able to       General Comments      Exercises     Assessment/Plan    PT Assessment Patient needs continued PT services  PT Problem List Decreased  strength;Decreased range of motion;Decreased activity tolerance;Decreased balance;Decreased mobility;Decreased coordination;Decreased knowledge of use of DME;Decreased safety awareness;Cardiopulmonary status limiting activity       PT Treatment Interventions Gait training;Stair training;Functional mobility training;Therapeutic exercise;Therapeutic activities;Balance training;Neuromuscular re-education;Patient/family education    PT Goals (Current goals can be found in the Care Plan section)  Acute Rehab PT Goals Patient Stated Goal: get breathing better PT Goal Formulation: With patient Time For Goal Achievement: 08/24/17 Potential to Achieve Goals: Good    Frequency Min 2X/week   Barriers to discharge        Co-evaluation               AM-PAC PT "6 Clicks" Daily Activity  Outcome Measure Difficulty turning over in bed (including adjusting bedclothes, sheets and blankets)?: None Difficulty moving from lying on back to sitting on the side of the bed? : None Difficulty sitting down on and standing up from a chair with arms (e.g., wheelchair, bedside commode, etc,.)?: None Help needed moving to and from a bed to chair (including a wheelchair)?: None Help needed walking in hospital room?: A Little Help needed climbing 3-5 steps with a railing? : A Little 6 Click Score: 22    End of Session Equipment Utilized During Treatment: Gait belt;Oxygen(3 liters during ambulation, sats <90% t/o majority of sessio) Activity Tolerance: Patient limited by fatigue Patient left: with chair alarm set;with call bell/phone within reach   PT Visit Diagnosis: Muscle weakness (generalized) (M62.81);Difficulty in walking, not elsewhere classified (R26.2)    Time: [id number] PT Time Calculation (min) (ACUTE ONLY): 35 min   Charges:   PT Evaluation $PT Eval Low Complexity: 1 Low PT Treatments $Gait Training: 8-22 mins   PT G Codes:   PT G-Codes **NOT FOR INPATIENT CLASS** Functional  Assessment Tool Used: AM-PAC 6 Clicks Basic Mobility Functional Limitation: Mobility: Walking and moving around Mobility: Walking and Moving Around Current Status ([id number]([id number]): At least 20 percent but less than 40 percent impaired, limited or restricted Mobility: Walking and Moving Around Goal Status [phone](G8979): At least 1 percent but less than 20 percent impaired, limited or restricted    Malachi ProGalen R Armarion Greek, DPT 08/10/2017, 10:46 AM

## 2017-08-11 LAB — CULTURE, BLOOD (ROUTINE X 2)
CULTURE: NO GROWTH
Culture: NO GROWTH

## 2017-08-11 LAB — BASIC METABOLIC PANEL
ANION GAP: 8 (ref 5–15)
BUN: 31 mg/dL — AB (ref 6–20)
CHLORIDE: 100 mmol/L — AB (ref 101–111)
CO2: 31 mmol/L (ref 22–32)
Calcium: 9.2 mg/dL (ref 8.9–10.3)
Creatinine, Ser: 1.07 mg/dL (ref 0.61–1.24)
GFR calc Af Amer: >60 mL/min (ref >60)
GFR calc non Af Amer: >60 mL/min (ref >60)
GLUCOSE: 120 mg/dL — AB (ref 65–99)
POTASSIUM: 4.6 mmol/L (ref 3.5–5.1)
SODIUM: 139 mmol/L (ref 135–145)

## 2017-08-11 MED ORDER — PREDNISONE 10 MG (21) PO TBPK: 10.0000 mg | ORAL_TABLET | Freq: Every day | ORAL | 0 refills | Status: DC

## 2017-08-11 NOTE — Progress Notes (Signed)
Patient ambulated to the bathroom and back. O2 saturation remained above 90% during ambulation and after on 3 liters of oxygen. Pulse remained between 110- 120.

## 2017-08-11 NOTE — Care Management Note (Signed)
Case Management Note  Patient Details  Name: Dustin [name] MRN: [id number] Date of Birth: 17-Jul-1956  Subjective/Objective:     [facility].               Action/Plan:   Expected Discharge Date:  08/11/17               Expected Discharge Plan:  Home w Home Health Services  In-House Referral:     Discharge planning Services  CM Consult  Post Acute Care Choice:  Resumption of Svcs/PTA Provider Choice offered to:     DME Arranged:    DME Agency:     HH Arranged:  RN, PT, OT, Respirator Therapy [name]  Status of Service:  Completed, signed off  If discussed at [employer] Meetings, dates discussed:    Additional Comments:  Obie Kallenbach A, RN 08/11/2017, 4:40 PM

## 2017-08-11 NOTE — Progress Notes (Signed)
SATURATION QUALIFICATIONS: (This note is used to comply with regulatory documentation for home oxygen)  Patient Saturations on 2 Liters of Oxygen at rest= 88%   Please briefly explain why patient needs home oxygen: COPD [name]

## 2017-08-11 NOTE — Discharge Instructions (Signed)
Follow-up with primary care physician in 1 week Follow-up with pulmonology in 1 week Quit smoking Continue 2-3 L of oxygen via nasal cannula Home health PT

## 2017-08-11 NOTE — Progress Notes (Signed)
Patient disagrees with discharge plan. MD notified. MD still wants discharge order.

## 2017-08-11 NOTE — Discharge Summary (Signed)
[facility] NAME: Dustin [name]    MR#:  [id number]  DATE OF BIRTH:  1956-07-22  DATE OF ADMISSION:  08/06/2017 ADMITTING PHYSICIAN: Loletha Grayer, MD  DATE OF DISCHARGE: 08/11/17  PRIMARY CARE PHYSICIAN: Lavera Guise, MD    ADMISSION DIAGNOSIS:  COPD exacerbation ([place]) [J44.1]  DISCHARGE DIAGNOSIS:  Active Problems:   COPD exacerbation (HCC)  Chronic hypoxic respiratory failure SECONDARY DIAGNOSIS:   Past Medical History:  Diagnosis Date  . Allergy   . Anxiety   . Anxiety   . Asthma   . Chronic diastolic CHF (congestive heart failure) ([name])    a. echo 07/2013: EF 60-65%, DD, biatrial dilatation, Ao sclerosis, dilated RV, moderate pulmonary HTN, elevated CV and RA pressures; b. patient reported echo at Dr. Laurelyn Sickle office 02/2015 - his office does not have record of him being a pt there c. echo 11/2015: EF 60-65%, Grade 1 DD, mod-severe pulm pressures  . Chronic respiratory failure (HCC)    a. on 2L via nasal cannula; b. secondary to COPD  . COPD (chronic obstructive pulmonary disease) ([place])   . Depression   . Depression   . Depression   . Emphysema of lung ([place])   . GERD (gastroesophageal reflux disease)   . Hypertension   . Personal history of tobacco use, presenting hazards to health 08/17/2015  . Tobacco abuse     HOSPITAL COURSE:  [name]  is a 61 y.o. male with a known history of COPD on chronic oxygen 2 L.  He was recently here in the hospital last month.  He was treated with a Zithromax and steroids and nebulizers for COPD exacerbation.  He went home and they titrated the steroids off.  He was doing well but then his phlegm became thick and his PMD put him on Levaquin for 7 days.  On Friday he was put on Bactrim.  He has been having a hard time breathing and fatigue.  He does smoke some cigarettes here and there.  He states he does have a fever at home and some chills and sweats.  In the ER,  his chest x-ray was negative.  Hospitalist services were contacted for further evaluation.  Marland Kitchen COPD exacerbation almost end-stage COPD Very slow progress,  feeling much better today.  Wants to go home Pending pulmonology consult, patient wants to see them as an outpatient Dyspneic with minimal exertion  Solu-Medrol taper to p.o. steroids and bronchodilator therapy with DuoNeb's Patient must stop smoking.   Pro-calcitonin less than 0.10 not considering any antibiotics . theophylline level 5.0, below normal  Incentive spirometry  blood cultures negative.  2. Chronic respiratory failure on 2-3 L of oxygen  3. Chronic diastolic congestive heart failure. Continue oral Lasix   4. Anxiety depression. PRN medication and Lexapro  5. Tobacco abuse. Smoking cessation counseling done 4 minutes by me.  6. Hand cramping and pain.   Negative ANA, rheumatoid factor and normal ESR    PT consult-home health PT      DISCHARGE CONDITIONS:   stable CONSULTS OBTAINED:  Treatment Team:  Allyne Gee, MD   PROCEDURES NONE   DRUG ALLERGIES:   Allergies  Allergen Reactions  . Asa [Aspirin] Other (See Comments)    Reaction: swelling of the right side.  . Bee Venom Swelling  . Codeine Other (See Comments)    Reaction: Difficulty breathing  . Ibuprofen Other (See Comments)    Reaction: Swelling of  the right side.    DISCHARGE MEDICATIONS:   Current Discharge Medication List    START taking these medications   Details  predniSONE (STERAPRED UNI-PAK 21 TAB) 10 MG (21) TBPK tablet Take 1 tablet (10 mg total) by mouth daily. Take 6 tablets by mouth for 1 day followed by  5 tablets by mouth for 1 day followed by  4 tablets by mouth for 1 day followed by  3 tablets by mouth for 1 day followed by  2 tablets by mouth for 1 day followed by  1 tablet by mouth for a day and stop Qty: 21 tablet, Refills: 0      CONTINUE these medications which have NOT CHANGED    Details  ALPRAZolam (XANAX) 1 MG tablet Take 1 tablet (1 mg total) by mouth 3 (three) times daily as needed for anxiety. Qty: 15 tablet, Refills: 0    Cholecalciferol (VITAMIN D) 2000 units CAPS Take 2,000 Units by mouth daily.    escitalopram (LEXAPRO) 20 MG tablet Take 20 mg by mouth at bedtime.     furosemide (LASIX) 20 MG tablet Take 20 mg by mouth daily.     gabapentin (NEURONTIN) 300 MG capsule Take 300 mg 3 (three) times daily by mouth.     potassium chloride (K-DUR,KLOR-CON) 10 MEQ tablet Take 10 mEq by mouth once.    Probiotic Product (PROBIOTIC-10) CAPS Take 1 capsule by mouth 2 (two) times daily.     rOPINIRole (REQUIP) 0.25 MG tablet Take 1 tablet 3 (three) times daily by mouth.  Refills: 3    theophylline (UNIPHYL) 400 MG 24 hr tablet Take 400 mg by mouth daily.     Tiotropium Bromide Monohydrate (SPIRIVA RESPIMAT) 2.5 MCG/ACT AERS Inhale 2 puffs into the lungs daily.     Albuterol Sulfate (PROAIR RESPICLICK) 349 (90 BASE) MCG/ACT AEPB Inhale 2 puffs into the lungs every 4 (four) hours as needed (for shortness of breath). Reported on 12/10/2015    guaiFENesin (MUCINEX) 600 MG 12 hr tablet Take 1 tablet (600 mg total) by mouth 2 (two) times daily. Qty: 14 tablet, Refills: 0    ipratropium-albuterol (DUONEB) 0.5-2.5 (3) MG/3ML SOLN Take 3 mLs by nebulization 4 (four) times daily as needed (for shortness of breath).    mometasone-formoterol (DULERA) 200-5 MCG/ACT AERO Inhale 1 puff into the lungs 2 (two) times daily.       STOP taking these medications     predniSONE (DELTASONE) 50 MG tablet          DISCHARGE INSTRUCTIONS:   Follow-up with primary care physician in 1 week Follow-up with pulmonology in 1 week Quit smoking Continue 2-3 L of oxygen via nasal cannula Home health PT   DIET:  Cardiac diet  DISCHARGE CONDITION:  Fair  ACTIVITY:  Activity as tolerated  OXYGEN:  Home Oxygen: Yes.     Oxygen Delivery: 3 liters/min via Patient connected to  nasal cannula oxygen  DISCHARGE LOCATION:  home   If you experience worsening of your admission symptoms, develop shortness of breath, life threatening emergency, suicidal or homicidal thoughts you must seek medical attention immediately by calling 911 or calling your MD immediately  if symptoms less severe.  You Must read complete instructions/literature along with all the possible adverse reactions/side effects for all the Medicines you take and that have been prescribed to you. Take any new Medicines after you have completely understood and accpet all the possible adverse reactions/side effects.   Please note  You were cared for  by a hospitalist during your hospital stay. If you have any questions about your discharge medications or the care you received while you were in the hospital after you are discharged, you can call the unit and asked to speak with the hospitalist on call if the hospitalist that took care of you is not available. Once you are discharged, your primary care physician will handle any further medical issues. Please note that NO REFILLS for any discharge medications will be authorized once you are discharged, as it is imperative that you return to your primary care physician (or establish a relationship with a primary care physician if you do not have one) for your aftercare needs so that they can reassess your need for medications and monitor your lab values.     Today  Chief Complaint  Patient presents with  . Shortness of Breath    Patient shortness of breath significantly improved.  Denies any tightness in the chest and Wants to go home ROS:  CONSTITUTIONAL: Denies fevers, chills. Denies any fatigue, weakness.  EYES: Denies blurry vision, double vision, eye pain. EARS, NOSE, THROAT: Denies tinnitus, ear pain, hearing loss. RESPIRATORY: Denies cough, wheeze, shortness of breath.  CARDIOVASCULAR: Denies chest pain, palpitations, edema.  GASTROINTESTINAL: Denies  nausea, vomiting, diarrhea, abdominal pain. Denies bright red blood per rectum. GENITOURINARY: Denies dysuria, hematuria. ENDOCRINE: Denies nocturia or thyroid problems. HEMATOLOGIC AND LYMPHATIC: Denies easy bruising or bleeding. SKIN: Denies rash or lesion. MUSCULOSKELETAL: Denies pain in neck, back, shoulder, knees, hips or arthritic symptoms.  NEUROLOGIC: Denies paralysis, paresthesias.  PSYCHIATRIC: Denies anxiety or depressive symptoms.   VITAL SIGNS:  Blood pressure 124/81, pulse 90, temperature 98.6 F (37 C), temperature source Oral, resp. rate 18, height '5\' 10"'  (1.778 m), weight 95.3 kg (210 lb), SpO2 92 %.  I/O:    Intake/Output Summary (Last 24 hours) at 08/11/2017 1224 Last data filed at 08/11/2017 1019 Gross per 24 hour  Intake 720 ml  Output 1950 ml  Net -1230 ml    PHYSICAL EXAMINATION:  GENERAL:  61 y.o.-year-old patient lying in the bed with no acute distress.  EYES: Pupils equal, round, reactive to light and accommodation. No scleral icterus. Extraocular muscles intact.  HEENT: Head atraumatic, normocephalic. Oropharynx and nasopharynx clear.  NECK:  Supple, no jugular venous distention. No thyroid enlargement, no tenderness.  LUNGS: Normal breath sounds bilaterally, no wheezing, rales,rhonchi or crepitation. No use of accessory muscles of respiration.  CARDIOVASCULAR: S1, S2 normal. No murmurs, rubs, or gallops.  ABDOMEN: Soft, non-tender, non-distended. Bowel sounds present. No organomegaly or mass.  EXTREMITIES: No pedal edema, cyanosis, or clubbing.  NEUROLOGIC: Cranial nerves II through XII are intact. Muscle strength 5/5 in all extremities. Sensation intact. Gait not checked.  PSYCHIATRIC: The patient is alert and oriented x 3.  SKIN: No obvious rash, lesion, or ulcer.   DATA REVIEW:   CBC Recent Labs  Lab 08/07/17 0008  WBC 4.4  HGB 13.2  HCT 39.3*  PLT 192    Chemistries  Recent Labs  Lab 08/06/17 1650  08/11/17 0832  NA 138   < > 139   K 4.0   < > 4.6  CL 99*   < > 100*  CO2 30   < > 31  GLUCOSE 113*   < > 120*  BUN 10   < > 31*  CREATININE 1.27*   < > 1.07  CALCIUM 9.1   < > 9.2  AST 29  --   --   ALT  21  --   --   ALKPHOS 61  --   --   BILITOT 0.5  --   --    < > = values in this interval not displayed.    Cardiac Enzymes No results for input(s): TROPONINI in the last 168 hours.  Microbiology Results  Results for orders placed or performed during the hospital encounter of 08/06/17  Culture, blood (Routine x 2)     Status: None   Collection Time: 08/06/17  4:50 PM  Result Value Ref Range Status   Specimen Description BLOOD LEFT HAND  Final   Special Requests   Final    BOTTLES DRAWN AEROBIC AND ANAEROBIC Blood Culture results may not be optimal due to an excessive volume of blood received in culture bottles   Culture NO GROWTH 5 DAYS  Final   Report Status 08/11/2017 FINAL  Final  Culture, blood (Routine x 2)     Status: None   Collection Time: 08/06/17  4:50 PM  Result Value Ref Range Status   Specimen Description BLOOD RIGHT HAND  Final   Special Requests   Final    BOTTLES DRAWN AEROBIC AND ANAEROBIC Blood Culture results may not be optimal due to an excessive volume of blood received in culture bottles   Culture NO GROWTH 5 DAYS  Final   Report Status 08/11/2017 FINAL  Final    RADIOLOGY:  No results found.  EKG:   Orders placed or performed during the hospital encounter of 06/28/17  . ED EKG  . ED EKG  . EKG 12-Lead  . EKG 12-Lead  . EKG      Management plans discussed with the patient, family and they are in agreement.  CODE STATUS:     Code Status Orders  (From admission, onward)        Start     Ordered   08/06/17 1822  Full code  Continuous     08/06/17 1822    Code Status History    Date Active Date Inactive Code Status Order ID Comments User Context   06/28/2017 23:04 07/03/2017 21:13 Full Code [id number]  Dustin Flock, MD ED   01/22/2017 21:29 01/29/2017 19:44 Full  Code [id number]  Henreitta Leber, MD Inpatient   06/23/2016 00:01 06/28/2016 19:43 Full Code [id number]  Lance Coon, MD Inpatient   03/25/2016 17:03 03/31/2016 23:08 Full Code [id number]  Samuella Cota, MD Inpatient   11/30/2015 05:12 12/06/2015 21:34 Full Code [id number]  Harrie Foreman, MD Inpatient   04/13/2015 17:22 04/21/2015 19:40 Full Code [id number]  Dustin Flock, MD Inpatient   03/31/2015 20:50 04/05/2015 20:48 Full Code [id number]  Hower, Aaron Mose, MD ED      TOTAL TIME TAKING CARE OF THIS PATIENT: 45 minutes.   Note: This dictation was prepared with Dragon dictation along with smaller phrase technology. Any transcriptional errors that result from this process are unintentional.   '@MEC' @  on 08/11/2017 at 12:24 PM  Between 7am to 6pm - Pager - [phone]  After 6pm go to [email or web address] - password [email or web address] [facility]  [place]  Office  [phone]  CC: Primary care physician; Lavera Guise, MD

## 2017-08-11 NOTE — Care Management Note (Addendum)
Case Management Note  Patient Details  Name: Hershal CoriaDennis R Irion MRN: [id number] Date of Birth: Dec 11, 1955  Subjective/Objective:     This writer attempted to discuss discharge planning with [name]. He appears to be angry and is refusing to be discharged home on 3L N/C stating "it is a bomb and I would be home bound on that much oxygen." This writer asked Jermaine at [facility] to please call [name] to discuss the difference between [name] being on his chronic 2L N/C and going up to 3L N/C. [name] requested to Appeal his discharge home and was provided with An Important Message from Medicare About Your Rights form with the phone numbers to call in an appeal listed on it.  Shaune LeeksJermaine Jenkins from [facility] agreed to bring a portable tank that could deliver 3L 02 to Mr Cubbage's hospital room for him to try, and to attempt to schedule a time with [name] for [facility] to deliver an air concentrator to his which could deliver up to 5L 02.    Action/Plan:   Expected Discharge Date:  08/11/17               Expected Discharge Plan:  Home w Home Health Services  In-House Referral:     Discharge planning Services  CM Consult  Post Acute Care Choice:  Resumption of Svcs/PTA Provider Choice offered to:     DME Arranged:    DME Agency:     HH Arranged:  RN, PT, OT, Respirator Therapy HH [name]  Status of Service:  Completed, signed off  If discussed at [employer] Meetings, dates discussed:    Additional Comments:  Tamilyn Lupien A, RN 08/11/2017, 2:24 PM

## 2017-08-11 NOTE — Progress Notes (Signed)
Received notice from Jola BabinskiMarilyn at [employer] appeals [phone] that [name] has appealed his discharge. The Care Management department [facility] noon tomorrow. They have up to 72h to make a determination after the receipt of records, during which the patient remains in the hospital unless he chooses to go.

## 2017-08-11 NOTE — Progress Notes (Signed)
Patient still does not agree with discharge. MD notified. Case manager notified. Continue to monitor.

## 2017-08-11 NOTE — Consult Note (Signed)
Pulmonary Critical Care  Initial Consult Note   Dustin Dustin R Ng ZOX:[id number] DOB: Mar 09, 1956 DOA: 08/06/2017  Referring physician: Dr Hoyle SauerGuru PCP: Lyndon CodeKhan, Dustin M, MD   Chief Complaint: Acute on chronic respiratory failure with hypoxia  HPI: Dustin [name] is a 61 y.o. male with known history of COPD presented to the hospital with increasing shortness of breath.  Apparently patient has had multiple admissions to the hospital for similar complaints.  The patient was last admitted and treated with IV steroids and antibiotics.  Patient had improvement was discharged and was actually seen back in the hospital doing fairly well.  He had been having increasing cough and increasing congestion and sputum production.  Patient did not improve with antibiotics as an outpatient.  At this time patient has no chest pain or palpitations.  Patient has been having some wheezing.  He feels somewhat congested.   Review of Systems:  Constitutional:  No weight loss, night sweats, Fevers, chills, +fatigue.  HEENT:  No headaches, nasal congestion, post nasal drip,  Cardio-vascular:  No chest pain, anasarca, dizziness, palpitations  GI:  No heartburn, indigestion, abdominal pain, nausea, vomiting, diarrhea  Resp:  +shortness of breath. +productive cough, No coughing up of blood. +wheezing Skin:  no rash or lesions.  Musculoskeletal:  No joint pain or swelling.   Remainder ROS performed and is unremarkable other than noted in HPI  Past Medical History:  Diagnosis Date  . Allergy   . Anxiety   . Anxiety   . Asthma   . Chronic diastolic CHF (congestive heart failure) (HCC)    a. echo 07/2013: EF 60-65%, DD, biatrial dilatation, Ao sclerosis, dilated RV, moderate pulmonary HTN, elevated CV and RA pressures; b. patient reported echo at Dr. Milta DeitersKhan's office 02/2015 - his office does not have record of him being a pt there c. echo 11/2015: EF 60-65%, Grade 1 DD, mod-severe pulm pressures  . Chronic respiratory  failure (HCC)    a. on 2L via nasal cannula; b. secondary to COPD  . COPD (chronic obstructive pulmonary disease) (HCC)   . Depression   . Depression   . Depression   . Emphysema of lung (HCC)   . GERD (gastroesophageal reflux disease)   . Hypertension   . Personal history of tobacco use, presenting hazards to health 08/17/2015  . Tobacco abuse    Past Surgical History:  Procedure Laterality Date  . ABDOMINAL SURGERY    . ADENOIDECTOMY    . CARPAL TUNNEL RELEASE Right   . hemorrh    . NOSE SURGERY    . ROTATOR CUFF REPAIR Right   . TENNIS ELBOW RELEASE/NIRSCHEL PROCEDURE Right   . URETHRA SURGERY     surgery 6 times from age [age] yrs old   Social History:  reports that he has been smoking e-cigarettes.  He has a 4.30 pack-year smoking history. he has never used smokeless tobacco. He reports that he does not drink alcohol or use drugs.  Allergies  Allergen Reactions  . Asa [Aspirin] Other (See Comments)    Reaction: swelling of the right side.  . Bee Venom Swelling  . Codeine Other (See Comments)    Reaction: Difficulty breathing  . Ibuprofen Other (See Comments)    Reaction: Swelling of the right side.    Family History  Problem Relation Age of Onset  . CAD Father   . Hypertension Mother   . Peripheral Artery Disease Mother   . Rheum arthritis Mother   . Diabetes Neg Hx  Prior to Admission medications   Medication Sig Start Date End Date Taking? Authorizing Provider  ALPRAZolam Prudy Feeler(XANAX) 1 MG tablet Take 1 tablet (1 mg total) by mouth 3 (three) times daily as needed for anxiety. 12/06/15  Yes Enedina FinnerPatel, Sona, MD  Cholecalciferol (VITAMIN D) 2000 units CAPS Take 2,000 Units by mouth daily.   Yes [provider]  escitalopram (LEXAPRO) 20 MG tablet Take 20 mg by mouth at bedtime.    Yes [provider]  furosemide (LASIX) 20 MG tablet Take 20 mg by mouth daily.    Yes [provider]  gabapentin (NEURONTIN) 300 MG capsule Take 300 mg 3 (three)  times daily by mouth.    Yes [provider]  potassium chloride (K-DUR,KLOR-CON) 10 MEQ tablet Take 10 mEq by mouth once.   Yes [provider]  Probiotic Product (PROBIOTIC-10) CAPS Take 1 capsule by mouth 2 (two) times daily.    Yes [provider]  rOPINIRole (REQUIP) 0.25 MG tablet Take 1 tablet 3 (three) times daily by mouth.  01/04/17  Yes [provider]  theophylline (UNIPHYL) 400 MG 24 hr tablet Take 400 mg by mouth daily.    Yes [provider]  Tiotropium Bromide Monohydrate (SPIRIVA RESPIMAT) 2.5 MCG/ACT AERS Inhale 2 puffs into the lungs daily.    Yes [provider]  Albuterol Sulfate (PROAIR RESPICLICK) 108 (90 BASE) MCG/ACT AEPB Inhale 2 puffs into the lungs every 4 (four) hours as needed (for shortness of breath). Reported on 12/10/2015    [provider]  guaiFENesin (MUCINEX) 600 MG 12 hr tablet Take 1 tablet (600 mg total) by mouth 2 (two) times daily. 07/03/17   Enedina FinnerPatel, Sona, MD  ipratropium-albuterol (DUONEB) 0.5-2.5 (3) MG/3ML SOLN Take 3 mLs by nebulization 4 (four) times daily as needed (for shortness of breath).    [provider]  mometasone-formoterol (DULERA) 200-5 MCG/ACT AERO Inhale 1 puff into the lungs 2 (two) times daily.     [provider]  predniSONE (DELTASONE) 50 MG tablet Start 50 mg daily taper by 5 mg then stop Patient not taking: Reported on 08/06/2017 07/03/17   Enedina FinnerPatel, Sona, MD  predniSONE (STERAPRED UNI-PAK 21 TAB) 10 MG (21) TBPK tablet Take 1 tablet (10 mg total) by mouth daily. Take 6 tablets by mouth for 1 day followed by  5 tablets by mouth for 1 day followed by  4 tablets by mouth for 1 day followed by  3 tablets by mouth for 1 day followed by  2 tablets by mouth for 1 day followed by  1 tablet by mouth for a day and stop 08/11/17   [name], Aruna, MD   Physical Exam: Vitals:   08/11/17 0526 08/11/17 0706 08/11/17 0757 08/11/17 1605  BP: (!) 110/57 124/81  (!) 141/74   Pulse: 77 90  (!) 102  Resp: 18 18    Temp: 97.9 F (36.6 C) 98.6 F (37 C)  97.8 F (36.6 C)  TempSrc: Oral Oral  Oral  SpO2: 94% 94% 92% 94%  Weight:      Height:        Wt Readings from Last 3 Encounters:  08/06/17 95.3 kg (210 lb)  07/02/17 99.5 kg (219 lb 6.4 oz)  02/22/17 98.7 kg (217 lb 8 oz)    General:  Appears calm and comfortable Eyes: PERRL, normal lids, irises & conjunctiva ENT: grossly normal hearing, lips & tongue Neck: no LAD, masses or thyromegaly Cardiovascular: RRR, no m/r/g. No LE edema. Respiratory:  Diminished breath sounds no increased work of breathing noted. Abdomen: soft, nontender Skin: no rash or induration seen on limited exam Musculoskeletal: grossly normal tone BUE/BLE Psychiatric: grossly normal mood and affect Neurologic: grossly non-focal.          Labs on Admission:  Basic Metabolic Panel: Recent Labs  Lab 08/06/17 1650 08/07/17 0008 08/11/17 0832  NA 138 140 139  K 4.0 4.8 4.6  CL 99* 104 100*  CO2 30 28 31   GLUCOSE 113* 227* 120*  BUN 10 13 31*  CREATININE 1.27* 1.30* 1.07  CALCIUM 9.1 8.8* 9.2   Liver Function Tests: Recent Labs  Lab 08/06/17 1650  AST 29  ALT 21  ALKPHOS 61  BILITOT 0.5  PROT 6.7  ALBUMIN 4.0   No results for input(s): LIPASE, AMYLASE in the last 168 hours. No results for input(s): AMMONIA in the last 168 hours. CBC: Recent Labs  Lab 08/06/17 1650 08/07/17 0008  WBC 5.3 4.4  NEUTROABS 3.4  --   HGB 13.6 13.2  HCT 40.3 39.3*  MCV 97.0 97.1  PLT 202 192   Cardiac Enzymes: No results for input(s): CKTOTAL, CKMB, CKMBINDEX, TROPONINI in the last 168 hours.  BNP (last 3 results) Recent Labs    01/22/17 1614 06/28/17 1721  BNP 200.0* 68.0    ProBNP (last 3 results) No results for input(s): PROBNP in the last 8760 hours.  CBG: No results for input(s): GLUCAP in the last 168 hours.  Radiological Exams on Admission: No results found.  EKG: Independently  reviewed.  Assessment/Plan Active Problems:   COPD exacerbation (HCC)   1. Acute on chronic respiratory failure with hypoxia Currently patient is doing a little bit better Would continue with oxygen therapy as ordered Continue with aggressive bronchodilators Continue with Solu-Medrol as ordered for 40 mg IV every 6 Chest x-ray was reviewed no acute pneumonia noted on the last film that was done  2. COPD with exacerbation As patient has been having frequent exacerbations We will need to discuss once again with him aggressive measures to stop smoking We will reassess pulmonary functions once he is discharged in the office At this time patient requires 3lpm oxygen and should be discharged hom eon this  3. Chronic tobacco abuse ongoing Discussed at length smoking cessation Likely reason for ongoing episodes of exacerbation of his underlying severe COPD    Code Status: Full code (must indicate code status--if unknown or must be presumed, indicate so)  Family Communication: None (indicate person spoken with, if applicable, with phone number if by telephone) Disposition Plan: Home (indicate anticipated LOS)  Time spent: 20 minutes    I have personally obtained a history, examined the patient, evaluated laboratory and imaging results, formulated the assessment and plan and placed orders.  The Patient requires high complexity decision making for assessment and support.    Yevonne Pax, MD [facility] Pulmonary Critical Care Medicine Sleep Medicine

## 2017-08-11 NOTE — Care Management Note (Signed)
Case Management Note  Patient Details  Name: Hershal CoriaDennis R Marteney MRN: [id number] Date of Birth: 06-03-1956  Subjective/Objective:   [facility] Anibal Hendersonim [facility] to please resume HH=PT, RN, OT and add respiratory care.                  Action/Plan:   Expected Discharge Date:  08/11/17               Expected Discharge Plan:  Home w Home Health Services  In-House Referral:     Discharge planning Services  CM Consult  Post Acute Care Choice:  Resumption of Svcs/PTA Provider Choice offered to:     DME Arranged:    DME Agency:     HH Arranged:  RN, PT, OT, Respirator Therapy HH Agency:  [name]  Status of Service:  Completed, signed off  If discussed at [employer] Meetings, dates discussed:    Additional Comments:  Velina Drollinger A, RN 08/11/2017, 10:37 AM

## 2017-08-12 ENCOUNTER — Other Ambulatory Visit: Payer: Self-pay

## 2017-08-12 MED ORDER — PREDNISONE 50 MG PO TABS
50.0000 mg | ORAL_TABLET | Freq: Every day | ORAL | Status: DC
  Administered 2017-08-13 – 2017-08-14 (×2): 50 mg via ORAL
  Filled 2017-08-12 (×2): qty 1

## 2017-08-12 MED ORDER — METHYLPREDNISOLONE SODIUM SUCC 40 MG IJ SOLR: 40.0000 mg | INTRAMUSCULAR | Status: DC

## 2017-08-12 NOTE — Care Management Note (Signed)
Case Management Note  Patient Details  Name: Dustin [name] MRN: [id number] Date of Birth: Aug 13, 1956  Subjective/Objective:    Discussed discharge planning with Dr Amado CoeGouru. Per Dr Amado CoeGouru,  Dustin [name] told Dr Amado CoeGouru this morning that he is ready to go home today on 3L 02. When [facility] delivered [name] portable 02 tank that could deliver 3L N/C continuously to [name] today, he again started saying that the portable oxygen tank which delivers 3L N/C would not allow him to be away from home more than 3 hours at [name] time and that he would effectively be homebound. This Clinical research associatewriter had the same discussion with Dustin [name] yesterday about oxygen at 3L when he called an [facility].                Action/Plan:   Expected Discharge Date:  08/12/17               Expected Discharge Plan:  Home w Home Health Services  In-House Referral:     Discharge planning Services  CM Consult  Post Acute Care Choice:  Resumption of Svcs/PTA Provider Choice offered to:     DME Arranged:    DME Agency:     HH Arranged:  RN, PT, OT, Respirator Therapy HH Agency[facility]  [name]  Status of Service:  Completed, signed off  If discussed at [employer] Meetings, dates discussed:    Additional Comments:  Dustin Yielding A, RN 08/12/2017, 12:45 PM

## 2017-08-12 NOTE — Progress Notes (Signed)
Patient doesn't want to go home, states he will not have much quality of life if he goes home on 3L oxygen. Patient educated that he is set for discharge, portable oxygen tank at the bedside, and advanced is set up for home health. Patient appealed d/c yesterday and is still pending. Patient states he is not going home today and that maybe tomorrow will be better. Case manager notified. Patient is aware portable tank will go at 3 Liters, but he states it will only last about 45 minutes and he wont be able to go out of the house and go places. I discussed with patient on his plan of discharge whether he is willing to be discharged on 3 liters of oxygen, or if he is waiting for his appeal process. Patient states he is going to stay here until tomorrow because he appeal is pending, and he doesn't want to go home on 3 liters of oxygen.

## 2017-08-12 NOTE — Progress Notes (Signed)
Pt remaining alert and oriented. Shortness of breath on exertion. Still with productive cough with small amount of sputum. Remaining on 2.5L of oxygen.

## 2017-08-12 NOTE — Progress Notes (Signed)
Patient ambulated on 2 L of O2 to nurses station, O2 sats dropped to 80. Oxygen increased to 3L. O2 sats 90 on 3 Liters with exertion, and 96 on 3 Liters at rest. O2 decreased to 2 Liters at rest, O2 sats 95.   O2 sats 92% on 2L at rest O2 sats 80% on 2L with exertion O2 sats 88% on 3L with exertion O2 sats 96% on 3L at rest

## 2017-08-12 NOTE — Discharge Summary (Addendum)
[place] [facility] [place] NAME: Dustin [name]    MR#:  [id number]  DATE OF BIRTH:  1956/01/14  DATE OF ADMISSION:  08/06/2017   ADMITTING PHYSICIAN: Loletha Grayer, MD  DATE OF DISCHARGE: 08/11/17  PRIMARY CARE PHYSICIAN: Lavera Guise, MD    ADMISSION DIAGNOSIS:  COPD exacerbation ([place]) [J44.1]  DISCHARGE DIAGNOSIS:  Active Problems:   COPD exacerbation (HCC)  Chronic hypoxic respiratory failure SECONDARY DIAGNOSIS:   Past Medical History:  Diagnosis Date  . Allergy   . Anxiety   . Anxiety   . Asthma   . Chronic diastolic CHF (congestive heart failure) ([place])    a. echo 07/2013: EF 60-65%, DD, biatrial dilatation, Ao sclerosis, dilated RV, moderate pulmonary HTN, elevated CV and RA pressures; b. patient reported echo at Dr. Laurelyn Sickle office 02/2015 - his office does not have record of him being a pt there c. echo 11/2015: EF 60-65%, Grade 1 DD, mod-severe pulm pressures  . Chronic respiratory failure (HCC)    a. on 2L via nasal cannula; b. secondary to COPD  . COPD (chronic obstructive pulmonary disease) ([place])   . Depression   . Depression   . Depression   . Emphysema of lung ([place])   . GERD (gastroesophageal reflux disease)   . Hypertension   . Personal history of tobacco use, presenting hazards to health 08/17/2015  . Tobacco abuse     HOSPITAL COURSE:  [name]  is a 62 y.o. male with a known history of COPD on chronic oxygen 2 L.  He was recently here in the hospital last month.  He was treated with a Zithromax and steroids and nebulizers for COPD exacerbation.  He went home and they titrated the steroids off.  He was doing well but then his phlegm became thick and his PMD put him on Levaquin for 7 days.  On Friday he was put on Bactrim.  He has been having a hard time breathing and fatigue.  He does smoke some cigarettes here and there.  He states he does have a fever at home and some chills and sweats.  In the  ER, his chest x-ray was negative.  Hospitalist services were contacted for further evaluation.  Marland Kitchen COPD exacerbation almost end-stage COPD  feeling much better today.  The patient is requiring 3 L of oxygen via nasal cannula, previously he was on 2 L of oxygen, discussed with case management and 3 L of oxygen and arranged to go home.  medically stable to discharge Seen by pulmonology , outpatient follow up Dyspneic with minimal exertion  Solu-Medrol taper to p.o. steroids and bronchodilator therapy with DuoNeb's Patient must stop smoking.   Pro-calcitonin less than 0.10 not considering any antibiotics . theophylline level 5.0, below normal  Incentive spirometry  blood cultures negative.  2. Chronic respiratory failure - request 3 L of oxygen  3. Chronic diastolic congestive heart failure. Continue oral Lasix   4. Anxiety depression. PRN medication and Lexapro  5. Tobacco abuse. Smoking cessation counseling done 4 minutes by me.  6. Hand cramping and pain.   Negative ANA, rheumatoid factor and normal ESR    PT consult-home health PT      DISCHARGE CONDITIONS:   stable CONSULTS OBTAINED:  Treatment Team:  Allyne Gee, MD   PROCEDURES NONE   DRUG ALLERGIES:   Allergies  Allergen Reactions  . Asa [Aspirin] Other (See Comments)    Reaction: swelling of the right  side.  . Bee Venom Swelling  . Codeine Other (See Comments)    Reaction: Difficulty breathing  . Ibuprofen Other (See Comments)    Reaction: Swelling of the right side.    DISCHARGE MEDICATIONS:   Current Discharge Medication List    START taking these medications   Details  predniSONE (STERAPRED UNI-PAK 21 TAB) 10 MG (21) TBPK tablet Take 1 tablet (10 mg total) by mouth daily. Take 6 tablets by mouth for 1 day followed by  5 tablets by mouth for 1 day followed by  4 tablets by mouth for 1 day followed by  3 tablets by mouth for 1 day followed by  2 tablets by mouth for 1 day  followed by  1 tablet by mouth for a day and stop Qty: 21 tablet, Refills: 0      CONTINUE these medications which have NOT CHANGED   Details  ALPRAZolam (XANAX) 1 MG tablet Take 1 tablet (1 mg total) by mouth 3 (three) times daily as needed for anxiety. Qty: 15 tablet, Refills: 0    Cholecalciferol (VITAMIN D) 2000 units CAPS Take 2,000 Units by mouth daily.    escitalopram (LEXAPRO) 20 MG tablet Take 20 mg by mouth at bedtime.     furosemide (LASIX) 20 MG tablet Take 20 mg by mouth daily.     gabapentin (NEURONTIN) 300 MG capsule Take 300 mg 3 (three) times daily by mouth.     potassium chloride (K-DUR,KLOR-CON) 10 MEQ tablet Take 10 mEq by mouth once.    Probiotic Product (PROBIOTIC-10) CAPS Take 1 capsule by mouth 2 (two) times daily.     rOPINIRole (REQUIP) 0.25 MG tablet Take 1 tablet 3 (three) times daily by mouth.  Refills: 3    theophylline (UNIPHYL) 400 MG 24 hr tablet Take 400 mg by mouth daily.     Tiotropium Bromide Monohydrate (SPIRIVA RESPIMAT) 2.5 MCG/ACT AERS Inhale 2 puffs into the lungs daily.     Albuterol Sulfate (PROAIR RESPICLICK) 939 (90 BASE) MCG/ACT AEPB Inhale 2 puffs into the lungs every 4 (four) hours as needed (for shortness of breath). Reported on 12/10/2015    guaiFENesin (MUCINEX) 600 MG 12 hr tablet Take 1 tablet (600 mg total) by mouth 2 (two) times daily. Qty: 14 tablet, Refills: 0    ipratropium-albuterol (DUONEB) 0.5-2.5 (3) MG/3ML SOLN Take 3 mLs by nebulization 4 (four) times daily as needed (for shortness of breath).    mometasone-formoterol (DULERA) 200-5 MCG/ACT AERO Inhale 1 puff into the lungs 2 (two) times daily.       STOP taking these medications     predniSONE (DELTASONE) 50 MG tablet          DISCHARGE INSTRUCTIONS:   Follow-up with primary care physician in 1 week Follow-up with pulmonology in 1 week Quit smoking Continue 2-3 L of oxygen via nasal cannula Home health PT   DIET:  Cardiac diet  DISCHARGE  CONDITION:  Fair  ACTIVITY:  Activity as tolerated  OXYGEN:  Home Oxygen: Yes.     Oxygen Delivery: 3 liters/min via Patient connected to nasal cannula oxygen  DISCHARGE LOCATION:  home   If you experience worsening of your admission symptoms, develop shortness of breath, life threatening emergency, suicidal or homicidal thoughts you must seek medical attention immediately by calling 911 or calling your MD immediately  if symptoms less severe.  You Must read complete instructions/literature along with all the possible adverse reactions/side effects for all the Medicines you take and that have  been prescribed to you. Take any new Medicines after you have completely understood and accpet all the possible adverse reactions/side effects.   Please note  You were cared for by a hospitalist during your hospital stay. If you have any questions about your discharge medications or the care you received while you were in the hospital after you are discharged, you can call the unit and asked to speak with the hospitalist on call if the hospitalist that took care of you is not available. Once you are discharged, your primary care physician will handle any further medical issues. Please note that NO REFILLS for any discharge medications will be authorized once you are discharged, as it is imperative that you return to your primary care physician (or establish a relationship with a primary care physician if you do not have one) for your aftercare needs so that they can reassess your need for medications and monitor your lab values.     Today  Chief Complaint  Patient presents with  . Shortness of Breath    Patient shortness of breath significantly improved.  Denies any tightness in the chest and requiring 3 L of oxygen via nasal cannula, which is arranged by the case manager Patient is refusing to leave the hospital on 3 L as he was living on 2 L before.  Continues to smoke advised to quit smoking.   Patient is stating that he is going to stay here until tomorrow because his appeal is pending and does not want to go home on 3 L of oxygen ROS:  CONSTITUTIONAL: Denies fevers, chills. Denies any fatigue, weakness.  EYES: Denies blurry vision, double vision, eye pain. EARS, NOSE, THROAT: Denies tinnitus, ear pain, hearing loss. RESPIRATORY: Denies cough, wheeze, shortness of breath.  CARDIOVASCULAR: Denies chest pain, palpitations, edema.  GASTROINTESTINAL: Denies nausea, vomiting, diarrhea, abdominal pain. Denies bright red blood per rectum. GENITOURINARY: Denies dysuria, hematuria. ENDOCRINE: Denies nocturia or thyroid problems. HEMATOLOGIC AND LYMPHATIC: Denies easy bruising or bleeding. SKIN: Denies rash or lesion. MUSCULOSKELETAL: Denies pain in neck, back, shoulder, knees, hips or arthritic symptoms.  NEUROLOGIC: Denies paralysis, paresthesias.  PSYCHIATRIC: Denies anxiety or depressive symptoms.   VITAL SIGNS:  Blood pressure 117/61, pulse 85, temperature 97.7 F (36.5 C), temperature source Oral, resp. rate 16, height '5\' 10"'  (1.778 m), weight 95.3 kg (210 lb), SpO2 99 %.  I/O:    Intake/Output Summary (Last 24 hours) at 08/12/2017 1232 Last data filed at 08/12/2017 1156 Gross per 24 hour  Intake 240 ml  Output 1450 ml  Net -1210 ml    PHYSICAL EXAMINATION:  GENERAL:  61 y.o.-year-old patient lying in the bed with no acute distress.  EYES: Pupils equal, round, reactive to light and accommodation. No scleral icterus. Extraocular muscles intact.  HEENT: Head atraumatic, normocephalic. Oropharynx and nasopharynx clear.  NECK:  Supple, no jugular venous distention. No thyroid enlargement, no tenderness.  LUNGS: Normal breath sounds bilaterally, no wheezing, rales,rhonchi or crepitation. No use of accessory muscles of respiration.  CARDIOVASCULAR: S1, S2 normal. No murmurs, rubs, or gallops.  ABDOMEN: Soft, non-tender, non-distended. Bowel sounds present. No organomegaly or  mass.  EXTREMITIES: No pedal edema, cyanosis, or clubbing.  NEUROLOGIC: Cranial nerves II through XII are intact. Muscle strength 5/5 in all extremities. Sensation intact. Gait not checked.  PSYCHIATRIC: The patient is alert and oriented x 3.  SKIN: No obvious rash, lesion, or ulcer.   DATA REVIEW:   CBC Recent Labs  Lab 08/07/17 0008  WBC 4.4  HGB 13.2  HCT 39.3*  PLT 192    Chemistries  Recent Labs  Lab 08/06/17 1650  08/11/17 0832  NA 138   < > 139  K 4.0   < > 4.6  CL 99*   < > 100*  CO2 30   < > 31  GLUCOSE 113*   < > 120*  BUN 10   < > 31*  CREATININE 1.27*   < > 1.07  CALCIUM 9.1   < > 9.2  AST 29  --   --   ALT 21  --   --   ALKPHOS 61  --   --   BILITOT 0.5  --   --    < > = values in this interval not displayed.    Cardiac Enzymes No results for input(s): TROPONINI in the last 168 hours.  Microbiology Results  Results for orders placed or performed during the hospital encounter of 08/06/17  Culture, blood (Routine x 2)     Status: None   Collection Time: 08/06/17  4:50 PM  Result Value Ref Range Status   Specimen Description BLOOD LEFT HAND  Final   Special Requests   Final    BOTTLES DRAWN AEROBIC AND ANAEROBIC Blood Culture results may not be optimal due to an excessive volume of blood received in culture bottles   Culture NO GROWTH 5 DAYS  Final   Report Status 08/11/2017 FINAL  Final  Culture, blood (Routine x 2)     Status: None   Collection Time: 08/06/17  4:50 PM  Result Value Ref Range Status   Specimen Description BLOOD RIGHT HAND  Final   Special Requests   Final    BOTTLES DRAWN AEROBIC AND ANAEROBIC Blood Culture results may not be optimal due to an excessive volume of blood received in culture bottles   Culture NO GROWTH 5 DAYS  Final   Report Status 08/11/2017 FINAL  Final    RADIOLOGY:  No results found.  EKG:   Orders placed or performed during the hospital encounter of 06/28/17  . ED EKG  . ED EKG  . EKG 12-Lead  . EKG  12-Lead  . EKG      Management plans discussed with the patient, family and they are in agreement.  CODE STATUS:     Code Status Orders  (From admission, onward)        Start     Ordered   08/06/17 1822  Full code  Continuous     08/06/17 1822    Code Status History    Date Active Date Inactive Code Status Order ID Comments User Context   06/28/2017 23:04 07/03/2017 21:13 Full Code [id number]  Dustin Flock, MD ED   01/22/2017 21:29 01/29/2017 19:44 Full Code [id number]  Henreitta Leber, MD Inpatient   06/23/2016 00:01 06/28/2016 19:43 Full Code [id number]  Lance Coon, MD Inpatient   03/25/2016 17:03 03/31/2016 23:08 Full Code [id number]  Samuella Cota, MD Inpatient   11/30/2015 05:12 12/06/2015 21:34 Full Code [id number]  Harrie Foreman, MD Inpatient   04/13/2015 17:22 04/21/2015 19:40 Full Code [id number]  Dustin Flock, MD Inpatient   03/31/2015 20:50 04/05/2015 20:48 Full Code [id number]  Hower, Aaron Mose, MD ED      TOTAL TIME TAKING CARE OF THIS PATIENT: 45 minutes.   Note: This dictation was prepared with Dragon dictation along with smaller phrase technology. Any transcriptional errors that result from this process are unintentional.   '@[facility]' @  on 08/12/2017 at 12:32 PM  Between 7am to 6pm - Pager - [phone]  After 6pm go to [email or web address] - password [email or web address] [facility]  [place] [facility]  Office  [phone]  CC: Primary care physician; Lavera Guise, MD

## 2017-08-12 NOTE — Care Management Note (Addendum)
Case Management Note  Patient Details  Name: Dustin CoriaDennis R [name] MRN: [id number] Date of Birth: Aug 21, 1956  Subjective/Objective:        [facility] Mr Valverde's Medicare appeal of this hospital discharge yet. Nicolasa DuckingSally Holland, CM Assist Dir. is aware of this medicare appeal.  [facility] has delivered a portable [facility] which can provide 3L 02 continuously, and will set up new 02 equipment to the home as soon as [name] returns home. [name] has [facility] for [facility] health PT, RN, OT, Resp therapy.             Action/Plan:   Expected Discharge Date:  08/12/17               Expected Discharge Plan:  Home w Home Health Services  In-House Referral:     Discharge planning Services  CM Consult  Post Acute Care Choice:  Resumption of Svcs/PTA Provider Choice offered to:     DME Arranged:    DME Agency:     HH Arranged:  RN, PT, OT, Respirator Therapy HH Agency[facility]  [name]  Status of Service:  Completed, signed off  If discussed at [employer] Meetings, dates discussed:    Additional Comments:  Hymen Arnett A, RN 08/12/2017, 4:18 PM

## 2017-08-12 NOTE — Care Management Note (Signed)
Case Management Note  Patient Details  Name: Dustin [name] MRN: [id number] Date of Birth: August 25, 1956  Subjective/Objective:     Gave phone number of Dustin [name]'s sister Dustin [name] [phone] to Dr Amado CoeGouru and requested that Dr Amado CoeGouru please call her about Dustin [name] hospital discharge. Dustin [name] appealed his hospital discharge yesterday to medicare.                Action/Plan:   Expected Discharge Date:  08/11/17               Expected Discharge Plan:  Home w Home Health Services  In-House Referral:     Discharge planning Services  CM Consult  Post Acute Care Choice:  Resumption of Svcs/PTA Provider Choice offered to:     DME Arranged:    DME Agency:     HH Arranged:  RN, PT, OT, Respirator Therapy HH Agency[facility]  [name]  Status of Service:  Completed, signed off  If discussed at [employer] Meetings, dates discussed:    Additional Comments:  [name] A, RN 08/12/2017, 8:10 AM

## 2017-08-12 NOTE — Progress Notes (Addendum)
Patient ambulated to nurses station and back. O2 remained above 84% on 2 L of oxygen. Pulse under 120.   Patient insisted on using 2L of O2 while ambulating instead of 3L    [address]Kimberly Shamari [name] VictorSN, [facility]

## 2017-08-13 LAB — CREATININE, SERUM
CREATININE: 1.07 mg/dL (ref 0.61–1.24)
GFR calc Af Amer: >60 mL/min (ref >60)
GFR calc non Af Amer: >60 mL/min (ref >60)

## 2017-08-13 NOTE — Progress Notes (Signed)
Physical Therapy Treatment Patient Details Name: Dustin CoriaDennis R Knappenberger MRN: [id number] DOB: October 31, 1955 Today's Date: 08/13/2017    History of Present Illness 61 y/o male who was here 3 weeks ago with SOB/COPD exacerbation.  Returns with same.     PT Comments    Pt presents with min deficits in strength, transfers, mobility, gait, and balance, and with significant deficits in activity tolerance.  Pt able to amb 1 x 10' and 1 x 50'.  Very slow, effortful cadence with amb with several short standing rest breaks on 3LO2/min.  Pt's SpO2 91% at baseline dropping to low 80's after amb with HR increasing from baseline of 102 bpm to high 110's.  PLB education provided along with frequent therapeutic rest breaks during session.  Pt with slow cadence up and down 4 steps and was unable to negotiate steps without holding one rail.  SpO2 down to 78% with HR 120 bpm after step training with SpO2 taking around 2 min to return to near baseline levels, MD notified.  Pt would likely benefit from PT services in a SNF setting upon discharge to safely address above deficits secondary to significant deficits in activity tolerance with O2 desaturation into the 70s with activity.         Follow Up Recommendations  SNF     Equipment Recommendations       Recommendations for Other Services       Precautions / Restrictions Restrictions Weight Bearing Restrictions: No    Mobility  Bed Mobility Overal bed mobility: Modified Independent                Transfers Overall transfer level: Modified independent Equipment used: None             General transfer comment: Pt able to rise to standing w/o assist, needed some time at EOB to prep  Ambulation/Gait Ambulation/Gait assistance: Min guard Ambulation Distance (Feet): 50 Feet Assistive device: Rolling walker (2 wheeled) Gait Pattern/deviations: Step-through pattern;Decreased step length - right;Decreased step length - left   Gait velocity  interpretation: Below normal speed for age/gender General Gait Details: Amb 1 x 10', 1 x 50'.  Very slow, effortful cadence with amb with several short standing rest breaks on 3LO2/min.  Pt's SpO2 91% at baseline and low 80's after amb with HR increasing from baseline of 102 bpm to high 110's.      Stairs     Stair Management: One rail Right Number of Stairs: 4 General stair comments: Pt with slow cadence up and down 4 steps and was unable to negotiate steps without holding one rail.  SpO2 down to 78% with HR 120 bpm after step training.   Wheelchair Mobility    Modified Rankin (Stroke Patients Only)       Balance Overall balance assessment: Needs assistance Sitting-balance support: No upper extremity supported;Feet supported;Feet unsupported Sitting balance-Leahy Scale: Normal     Standing balance support: No upper extremity supported;During functional activity Standing balance-Leahy Scale: Fair Standing balance comment: Pt able to maintain balance, but lacks confidence with any dynamic acitity                            Cognition Arousal/Alertness: Awake/alert Behavior During Therapy: WFL for tasks assessed/performed Overall Cognitive Status: Within Functional Limits for tasks assessed  Exercises Total Joint Exercises Ankle Circles/Pumps: AROM;Both;10 reps Quad Sets: Strengthening;Both;10 reps Heel Slides: AROM;Both;10 reps Long Arc Quad: AROM;Both;10 reps;15 reps Knee Flexion: AROM;Both;10 reps;15 reps Marching in Standing: AROM;Both;5 reps Other Exercises Other Exercises: Physiological benefits of activity education provided along with energy conservation measures.   Other Exercises: Benefits of smoking cessation eduation provided. Other Exercises: Principles of activity progression related to dyspnea spiral with COPD education provided.     General Comments        Pertinent Vitals/Pain Pain  Assessment: No/denies pain    Home Living                      Prior Function            PT Goals (current goals can now be found in the care plan section) Progress towards PT goals: Progressing toward goals    Frequency    Min 2X/week      PT Plan Discharge plan needs to be updated    Co-evaluation              AM-PAC PT "6 Clicks" Daily Activity  Outcome Measure                   End of Session Equipment Utilized During Treatment: Gait belt;Oxygen Activity Tolerance: Patient limited by fatigue Patient left: in chair;with call bell/phone within reach Nurse Communication: Mobility status PT Visit Diagnosis: Muscle weakness (generalized) (M62.81);Difficulty in walking, not elsewhere classified (R26.2)     Time: [id number] PT Time Calculation (min) (ACUTE ONLY): 58 min  Charges:  $Gait Training: 8-22 mins $Therapeutic Exercise: 8-22 mins $Therapeutic Activity: 23-37 mins                    G Codes:       D. Scott Khristian Phillippi PT, DPT 08/13/17, 1:32 PM

## 2017-08-13 NOTE — Clinical Social Work Placement (Signed)
   CLINICAL SOCIAL WORK PLACEMENT  NOTE  Date:  08/13/2017  Patient Details  Name: Dustin [name] MRN: [id number] Date of Birth: 1956-09-02  Clinical Social Work is seeking post-discharge placement for this patient at the Skilled  Nursing Facility level of care (*CSW will initial, date and re-position this form in  chart as items are completed):  Yes   Patient/family provided with [place] Clinical Social Work Department's list of facilities offering this level of care within the geographic area requested by the patient (or if unable, by the patient's family).  Yes   Patient/family informed of their freedom to choose among providers that offer the needed level of care, that participate in Medicare, Medicaid or managed care program needed by the patient, have an available bed and are willing to accept the patient.  Yes   Patient/family informed of [name] ownership interest [facility] and [facility], as well as of the fact that they are under no obligation to receive care at these facilities.  PASRR submitted to EDS on       PASRR number received on       Existing PASRR number confirmed on 08/13/17     FL2 transmitted to all facilities in geographic area requested by pt/family on 08/13/17     FL2 transmitted to all facilities within larger geographic area on       Patient informed that his/her managed care company has contracts with or will negotiate with certain facilities, including the following:        Yes   Patient/family informed of bed offers received.  Patient chooses bed at [facility]     Physician recommends and patient chooses bed at      Patient to be transferred to   on  .  Patient to be transferred to facility by       Patient family notified on   of transfer.  Name of family member notified:        PHYSICIAN       Additional Comment:    _______________________________________________ Lary Eckardt, [name], [name] 08/13/2017,  4:21 PM

## 2017-08-13 NOTE — Discharge Summary (Signed)
[facility] NAME: Dustin [name]    MR#:  [id number]  DATE OF BIRTH:  01/06/1956  DATE OF ADMISSION:  08/06/2017   ADMITTING PHYSICIAN: Loletha Grayer, MD  DATE OF DISCHARGE: 08/12/17  PRIMARY CARE PHYSICIAN: Lavera Guise, MD    ADMISSION DIAGNOSIS:  COPD exacerbation ([place]) [J44.1]  DISCHARGE DIAGNOSIS:  Active Problems:   COPD exacerbation (H[facility])  Chronic hypoxic respiratory failure SECONDARY DIAGNOSIS:   Past Medical History:  Diagnosis Date  . Allergy   . Anxiety   . Anxiety   . Asthma   . Chronic diastolic CHF (congestive heart failure) ([place])    a. echo 07/2013: EF 60-65%, DD, biatrial dilatation, Ao sclerosis, dilated RV, moderate pulmonary HTN, elevated CV and RA pressures; b. patient reported echo at Dr. Laurelyn Sickle office 02/2015 - his office does not have record of him being a pt there c. echo 11/2015: EF 60-65%, Grade 1 DD, mod-severe pulm pressures  . Chronic respiratory failure (HCC)    a. on 2L via nasal cannula; b. secondary to COPD  . COPD (chronic obstructive pulmonary disease) ([place])   . Depression   . Depression   . Depression   . Emphysema of lung ([place])   . GERD (gastroesophageal reflux disease)   . Hypertension   . Personal history of tobacco use, presenting hazards to health 08/17/2015  . Tobacco abuse     HOSPITAL COURSE:  [name]  is a 61 y.o. male with a known history of COPD on chronic oxygen 2 L.  He was recently here in the hospital last month.  He was treated with a Zithromax and steroids and nebulizers for COPD exacerbation.  He went home and they titrated the steroids off.  He was doing well but then his phlegm became thick and his PMD put him on Levaquin for 7 days.  On Friday he was put on Bactrim.  He has been having a hard time breathing and fatigue.  He does smoke some cigarettes here and there.  He states he does have a fever at home and some chills and sweats.  In the  ER, his chest x-ray was negative.  Hospitalist services were contacted for further evaluation.  Marland Kitchen COPD exacerbation almost end-stage COPD  feeling much better today.  Wants to go to rehab The patient is requiring 3 L of oxygen via nasal cannula, previously he was on 2 L of oxygen, discussed with case management and 3 L of oxygen and arranged .   medically stable to discharge Seen by pulmonology , outpatient follow up Dyspneic with minimal exertion  Solu-Medrol taper to p.o. steroids and bronchodilator therapy with DuoNeb's Patient must stop smoking.   Pro-calcitonin less than 0.10 not considering any antibiotics . theophylline level 5.0, below normal  Incentive spirometry  blood cultures negative.  2. Chronic respiratory failure - requiring 3 L of oxygen  3. Chronic diastolic congestive heart failure. Continue oral Lasix   4. Anxiety depression. PRN medication and Lexapro  5. Tobacco abuse. Smoking cessation counseling done 4 minutes by me.  6. Hand cramping and pain.   Negative ANA, rheumatoid factor and normal ESR    PT consult- reassessed pt - SNF      DISCHARGE CONDITIONS:   stable CONSULTS OBTAINED:  Treatment Team:  Allyne Gee, MD   PROCEDURES NONE   DRUG ALLERGIES:   Allergies  Allergen Reactions  . Asa [Aspirin] Other (See Comments)  Reaction: swelling of the right side.  . Bee Venom Swelling  . Codeine Other (See Comments)    Reaction: Difficulty breathing  . Ibuprofen Other (See Comments)    Reaction: Swelling of the right side.    DISCHARGE MEDICATIONS:   Current Discharge Medication List    START taking these medications   Details  predniSONE (STERAPRED UNI-PAK 21 TAB) 10 MG (21) TBPK tablet Take 1 tablet (10 mg total) by mouth daily. Take 6 tablets by mouth for 1 day followed by  5 tablets by mouth for 1 day followed by  4 tablets by mouth for 1 day followed by  3 tablets by mouth for 1 day followed by  2  tablets by mouth for 1 day followed by  1 tablet by mouth for a day and stop Qty: 21 tablet, Refills: 0      CONTINUE these medications which have NOT CHANGED   Details  ALPRAZolam (XANAX) 1 MG tablet Take 1 tablet (1 mg total) by mouth 3 (three) times daily as needed for anxiety. Qty: 15 tablet, Refills: 0    Cholecalciferol (VITAMIN D) 2000 units CAPS Take 2,000 Units by mouth daily.    escitalopram (LEXAPRO) 20 MG tablet Take 20 mg by mouth at bedtime.     furosemide (LASIX) 20 MG tablet Take 20 mg by mouth daily.     gabapentin (NEURONTIN) 300 MG capsule Take 300 mg 3 (three) times daily by mouth.     potassium chloride (K-DUR,KLOR-CON) 10 MEQ tablet Take 10 mEq by mouth once.    Probiotic Product (PROBIOTIC-10) CAPS Take 1 capsule by mouth 2 (two) times daily.     rOPINIRole (REQUIP) 0.25 MG tablet Take 1 tablet 3 (three) times daily by mouth.  Refills: 3    theophylline (UNIPHYL) 400 MG 24 hr tablet Take 400 mg by mouth daily.     Tiotropium Bromide Monohydrate (SPIRIVA RESPIMAT) 2.5 MCG/ACT AERS Inhale 2 puffs into the lungs daily.     Albuterol Sulfate (PROAIR RESPICLICK) 213 (90 BASE) MCG/ACT AEPB Inhale 2 puffs into the lungs every 4 (four) hours as needed (for shortness of breath). Reported on 12/10/2015    guaiFENesin (MUCINEX) 600 MG 12 hr tablet Take 1 tablet (600 mg total) by mouth 2 (two) times daily. Qty: 14 tablet, Refills: 0    ipratropium-albuterol (DUONEB) 0.5-2.5 (3) MG/3ML SOLN Take 3 mLs by nebulization 4 (four) times daily as needed (for shortness of breath).    mometasone-formoterol (DULERA) 200-5 MCG/ACT AERO Inhale 1 puff into the lungs 2 (two) times daily.       STOP taking these medications     predniSONE (DELTASONE) 50 MG tablet          DISCHARGE INSTRUCTIONS:   Follow-up with primary care physician in 1 week Follow-up with pulmonology in 1 week Quit smoking Continue 3 L of oxygen via nasal cannula Home health PT   DIET:  Cardiac  diet  DISCHARGE CONDITION:  Fair  ACTIVITY:  Activity as tolerated  OXYGEN:  Home Oxygen: Yes.     Oxygen Delivery: 3 liters/min via Patient connected to nasal cannula oxygen  DISCHARGE LOCATION:  home   If you experience worsening of your admission symptoms, develop shortness of breath, life threatening emergency, suicidal or homicidal thoughts you must seek medical attention immediately by calling 911 or calling your MD immediately  if symptoms less severe.  You Must read complete instructions/literature along with all the possible adverse reactions/side effects for all the Medicines  you take and that have been prescribed to you. Take any new Medicines after you have completely understood and accpet all the possible adverse reactions/side effects.   Please note  You were cared for by a hospitalist during your hospital stay. If you have any questions about your discharge medications or the care you received while you were in the hospital after you are discharged, you can call the unit and asked to speak with the hospitalist on call if the hospitalist that took care of you is not available. Once you are discharged, your primary care physician will handle any further medical issues. Please note that NO REFILLS for any discharge medications will be authorized once you are discharged, as it is imperative that you return to your primary care physician (or establish a relationship with a primary care physician if you do not have one) for your aftercare needs so that they can reassess your need for medications and monitor your lab values.     Today  Chief Complaint  Patient presents with  . Shortness of Breath    Patient shortness of breath significantly improved.  Denies any tightness in the chest and requiring 3 L of oxygen via nasal cannula, which is arranged by the case manager Continues to smoke advised to quit smoking.  Patient is stating that he prefers going rehab ROS:   CONSTITUTIONAL: Denies fevers, chills. Denies any fatigue, weakness.  EYES: Denies blurry vision, double vision, eye pain. EARS, NOSE, THROAT: Denies tinnitus, ear pain, hearing loss. RESPIRATORY: Denies cough, wheeze, shortness of breath.  CARDIOVASCULAR: Denies chest pain, palpitations, edema.  GASTROINTESTINAL: Denies nausea, vomiting, diarrhea, abdominal pain. Denies bright red blood per rectum. GENITOURINARY: Denies dysuria, hematuria. ENDOCRINE: Denies nocturia or thyroid problems. HEMATOLOGIC AND LYMPHATIC: Denies easy bruising or bleeding. SKIN: Denies rash or lesion. MUSCULOSKELETAL: Denies pain in neck, back, shoulder, knees, hips or arthritic symptoms.  NEUROLOGIC: Denies paralysis, paresthesias.  PSYCHIATRIC: Denies anxiety or depressive symptoms.   VITAL SIGNS:  Blood pressure 134/84, pulse 87, temperature 98.7 F (37.1 C), temperature source Oral, resp. rate 18, height '5\' 10"'  (1.778 m), weight 95.3 kg (210 lb), SpO2 92 %.  I/O:    Intake/Output Summary (Last 24 hours) at 08/13/2017 1222 Last data filed at 08/13/2017 1124 Gross per 24 hour  Intake 960 ml  Output 2500 ml  Net -1540 ml    PHYSICAL EXAMINATION:  GENERAL:  61 y.o.-year-old patient lying in the bed with no acute distress.  EYES: Pupils equal, round, reactive to light and accommodation. No scleral icterus. Extraocular muscles intact.  HEENT: Head atraumatic, normocephalic. Oropharynx and nasopharynx clear.  NECK:  Supple, no jugular venous distention. No thyroid enlargement, no tenderness.  LUNGS: Normal breath sounds bilaterally, no wheezing, rales,rhonchi or crepitation. No use of accessory muscles of respiration.  CARDIOVASCULAR: S1, S2 normal. No murmurs, rubs, or gallops.  ABDOMEN: Soft, non-tender, non-distended. Bowel sounds present. No organomegaly or mass.  EXTREMITIES: No pedal edema, cyanosis, or clubbing.  NEUROLOGIC: Cranial nerves II through XII are intact. Muscle strength 5/5 in all  extremities. Sensation intact. Gait not checked.  PSYCHIATRIC: The patient is alert and oriented x 3.  SKIN: No obvious rash, lesion, or ulcer.   DATA REVIEW:   CBC Recent Labs  Lab 08/07/17 0008  WBC 4.4  HGB 13.2  HCT 39.3*  PLT 192    Chemistries  Recent Labs  Lab 08/06/17 1650  08/11/17 0832 08/13/17 0518  NA 138   < > 139  --  K 4.0   < > 4.6  --   CL 99*   < > 100*  --   CO2 30   < > 31  --   GLUCOSE 113*   < > 120*  --   BUN 10   < > 31*  --   CREATININE 1.27*   < > 1.07 1.07  CALCIUM 9.1   < > 9.2  --   AST 29  --   --   --   ALT 21  --   --   --   ALKPHOS 61  --   --   --   BILITOT 0.5  --   --   --    < > = values in this interval not displayed.    Cardiac Enzymes No results for input(s): TROPONINI in the last 168 hours.  Microbiology Results  Results for orders placed or performed during the hospital encounter of 08/06/17  Culture, blood (Routine x 2)     Status: None   Collection Time: 08/06/17  4:50 PM  Result Value Ref Range Status   Specimen Description BLOOD LEFT HAND  Final   Special Requests   Final    BOTTLES DRAWN AEROBIC AND ANAEROBIC Blood Culture results may not be optimal due to an excessive volume of blood received in culture bottles   Culture NO GROWTH 5 DAYS  Final   Report Status 08/11/2017 FINAL  Final  Culture, blood (Routine x 2)     Status: None   Collection Time: 08/06/17  4:50 PM  Result Value Ref Range Status   Specimen Description BLOOD RIGHT HAND  Final   Special Requests   Final    BOTTLES DRAWN AEROBIC AND ANAEROBIC Blood Culture results may not be optimal due to an excessive volume of blood received in culture bottles   Culture NO GROWTH 5 DAYS  Final   Report Status 08/11/2017 FINAL  Final    RADIOLOGY:  No results found.  EKG:   Orders placed or performed during the hospital encounter of 06/28/17  . ED EKG  . ED EKG  . EKG 12-Lead  . EKG 12-Lead  . EKG      Management plans discussed with the  patient, family and they are in agreement.  CODE STATUS:     Code Status Orders  (From admission, onward)        Start     Ordered   08/06/17 1822  Full code  Continuous     08/06/17 1822    Code Status History    Date Active Date Inactive Code Status Order ID Comments User Context   06/28/2017 23:04 07/03/2017 21:13 Full Code [id number]  Dustin Flock, MD ED   01/22/2017 21:29 01/29/2017 19:44 Full Code [id number]  Henreitta Leber, MD Inpatient   06/23/2016 00:01 06/28/2016 19:43 Full Code [id number]  Lance Coon, MD Inpatient   03/25/2016 17:03 03/31/2016 23:08 Full Code [id number]  Samuella Cota, MD Inpatient   11/30/2015 05:12 12/06/2015 21:34 Full Code [id number]  Harrie Foreman, MD Inpatient   04/13/2015 17:22 04/21/2015 19:40 Full Code [id number]  Dustin Flock, MD Inpatient   03/31/2015 20:50 04/05/2015 20:48 Full Code [id number]  Hower, Aaron Mose, MD ED      TOTAL TIME TAKING CARE OF THIS PATIENT: 45 minutes.   Note: This dictation was prepared with Dragon dictation along with smaller phrase technology. Any transcriptional errors that result from this process are unintentional.   '@[facility]' @  on 08/13/2017 at 12:22 PM  Between 7am to 6pm - Pager - [phone]  After 6pm go to [email or web address] - password [email or web address] [facility]  [facility]  Office  [phone]  CC: Primary care physician; Lavera Guise, MD

## 2017-08-13 NOTE — Progress Notes (Signed)
Pt's O2 has been on 2L since 11/21

## 2017-08-13 NOTE — [place] FL2 (Signed)
[facility] MEDICAID FL2 LEVEL OF CARE SCREENING TOOL     IDENTIFICATION  Patient Name: Dustin [name] Birthdate: Mar 17, 1956 Sex: male Admission Date (Current Location): 08/06/2017  [name]   Facility and Address:  [name], [name], [name], [name] [address]      Provider Number: [id number]  Attending Physician Name and Address:  [name], Macon Sandiford, MD  Relative Name and Phone Number:       Current Level of Care: Hospital Recommended Level of Care: Skilled Nursing Facility Prior Approval Number:    Date Approved/Denied:   PASRR Number: [id number] )  Discharge Plan: SNF    Current Diagnoses: Patient Active Problem List   Diagnosis Date Noted  . Acute on chronic respiratory failure (HCC) 06/28/2017  . Anxiety 06/22/2016  . Essential hypertension 06/22/2016  . Acute respiratory failure with hypoxia (HCC) 03/25/2016  . Tobacco use disorder 03/25/2016  . Acute on chronic respiratory failure with hypercapnia (HCC)   . Endotracheally intubated   . Chest pain with low risk of acute coronary syndrome 11/30/2015  . Chronic diastolic heart failure (HCC) 11/30/2015  . Sinus tachycardia seen on cardiac monitor 11/30/2015  . Hypercapnic respiratory failure (HCC) 11/30/2015  . Pulmonary hypertension (HCC)   . Centrilobular emphysema (HCC)   . Lethargy   . Personal history of tobacco use, presenting hazards to health 08/17/2015  . Pressure ulcer 04/19/2015  . Acute on chronic diastolic CHF (congestive heart failure) (HCC)   . COPD exacerbation (HCC) 03/31/2015    Orientation RESPIRATION BLADDER Height & Weight     Self, Time, Situation, Place  O2(3 Literx Oxygen. ) Continent Weight: 210 lb (95.3 kg) Height:  5\' 10"  (177.8 cm)  BEHAVIORAL SYMPTOMS/MOOD NEUROLOGICAL BOWEL NUTRITION STATUS      Continent Diet(Diet: Heart Healthy )  AMBULATORY STATUS COMMUNICATION OF NEEDS Skin   Extensive Assist Verbally  Normal                       Personal Care Assistance Level of Assistance  Bathing, Feeding, Dressing Bathing Assistance: Limited assistance Feeding assistance: Independent Dressing Assistance: Limited assistance     Functional Limitations Info  Sight, Hearing, Speech Sight Info: Adequate Hearing Info: Adequate Speech Info: Adequate    SPECIAL CARE FACTORS FREQUENCY  PT (By licensed PT), OT (By licensed OT)     PT Frequency: (5) OT Frequency: (5)            Contractures      Additional Factors Info  Code Status, Allergies Code Status Info: (Full Code. ) Allergies Info: (Asa Aspirin, Bee Venom, Codeine, Ibuprofen)           Current Medications (08/13/2017):  This is the current hospital active medication list Current Facility-Administered Medications  Medication Dose Route Frequency Provider Last Rate Last Dose  . acetaminophen (TYLENOL) tablet 650 mg  650 mg Oral Q6H PRN [name], [name], MD   650 mg at 08/07/17 1728   Or  . acetaminophen (TYLENOL) suppository 650 mg  650 mg Rectal Q6H PRN [name], [name], MD      . acidophilus ([name]) capsule 1 capsule  1 capsule Oral BID [name], [name], MD   1 capsule at 08/13/17 0933  . ALPRAZolam Prudy Feeler(XANAX) tablet 1 mg  1 mg Oral TID PRN [name], [name], MD   1 mg at 08/13/17 1122  . budesonide (PULMICORT) nebulizer solution 0.5 mg  0.5 mg Nebulization BID [name], [name], MD   0.5  mg at 08/13/17 0734  . cholecalciferol (VITAMIN D) tablet 2,000 Units  2,000 Units Oral Daily [name], [name], MD   2,000 Units at 08/13/17 0934  . enoxaparin (LOVENOX) injection 40 mg  40 mg Subcutaneous Q24H [name], [name], MD   40 mg at 08/12/17 2059  . escitalopram (LEXAPRO) tablet 20 mg  20 mg Oral QHS [name], [name], MD   20 mg at 08/12/17 2100  . furosemide (LASIX) tablet 20 mg  20 mg Oral Daily [name], [name], MD   20 mg at 08/13/17 0935  . gabapentin (NEURONTIN) capsule 300 mg  300 mg Oral TID [name], [name], MD   300 mg at  08/13/17 0934  . guaiFENesin (MUCINEX) 12 hr tablet 600 mg  600 mg Oral BID [name], [name], MD   600 mg at 08/13/17 0934  . ipratropium-albuterol (DUONEB) 0.5-2.5 (3) MG/3ML nebulizer solution 3 mL  3 mL Nebulization Q6H [name], [name], MD   3 mL at 08/13/17 1322  . predniSONE (DELTASONE) tablet 50 mg  50 mg Oral Q breakfast [name], MD   50 mg at 08/13/17 0841  . rOPINIRole (REQUIP) tablet 0.25 mg  0.25 mg Oral TID [name], [name], MD   0.25 mg at 08/13/17 0934  . sodium chloride (OCEAN) 0.65 % nasal spray 1 spray  1 spray Each Nare PRN [name], MD   1 spray at 08/10/17 1019  . theophylline (THEO-24) 24 hr capsule 400 mg  400 mg Oral Daily [name], [name], MD   400 mg at 08/13/17 0937  . tiotropium (SPIRIVA) inhalation capsule 18 mcg  1 capsule Inhalation Daily [name], [name], MD   18 mcg at 08/13/17 0932     Discharge Medications: Please see discharge summary for a list of discharge medications.  Relevant Imaging Results:  Relevant Lab Results:   Additional Information (SSN: [id number])  Sample, [name], [name]

## 2017-08-13 NOTE — Care Management (Addendum)
Spoke with patient to clarify his goals for discharge. Patient states he would like to go to SNF for short term rehab. [facility]. Requested PT reevaluate patient. CSW updated.

## 2017-08-13 NOTE — Clinical Social Work Note (Signed)
Clinical Social Work Assessment  Patient Details  Name: Dustin [name] MRN: [id number] Date of Birth: 04-01-1956  Date of referral:  08/13/17               Reason for consult:  Facility Placement                Permission sought to share information with:  Chartered certified accountant granted to share information::  Yes, Verbal Permission Granted  Name::      [name]::   [place]   Relationship::     Contact Information:     Housing/Transportation Living arrangements for the past 2 months:  [place] of Information:  Patient Patient Interpreter Needed:  None Criminal Activity/Legal Involvement Pertinent to Current Situation/Hospitalization:  No - Comment as needed Significant Relationships:  Adult Children Lives with:  Adult Children Do you feel safe going back to the place where you live?  Yes Need for family participation in patient care:  Yes (Comment)  Care giving concerns:  Patient lives in [place] with his daughter Dustin [name].    Social Worker assessment / plan:  Holiday representative (CSW) received SNF consult. PT is recommending SNF. CSW met with patient alone at bedside to address consult. Patient was alert and oriented X4 and was sitting up in the chair at bedside. CSW introduced self and explained role of CSW department. Patient reported that he lives in [place] with his daughter and wants to go [facility] for rehab. Patient reported that he doesn't want to discharge today and is in the middle of appealing his discharge from the hospital. Patient is agreeable to SNF search in [facility]. FL2 complete and faxed out.   CSW presented bed offers to patient and he chose [facility]. Patient reported that he no longer smokes and understands that [facility] is a non-smoking facility. Patient is agreeable to D/C to [facility] Deborah admissions coordinator at [facility] is aware of above. CSW  will continue to follow and assist as needed.   Employment status:  Disabled (Comment on whether or not currently receiving Disability) Insurance information:  Managed Medicare PT Recommendations:  [place] / Referral to community resources:  [place]  Patient/Family's Response to care:  Patient is agreeable to D/C to [facility].   Patient/Family's Understanding of and Emotional Response to Diagnosis, Current Treatment, and Prognosis:  Patient was very pleasant and thanked CSW for assistance.   Emotional Assessment Appearance:  Appears stated age Attitude/Demeanor/Rapport:    Affect (typically observed):  Accepting, Adaptable, Pleasant Orientation:  Oriented to Self, Oriented to Place, Oriented to  Time, Oriented to Situation Alcohol / Substance use:  Not Applicable Psych involvement (Current and /or in the community):  No (Comment)  Discharge Needs  Concerns to be addressed:  Discharge Planning Concerns Readmission within the last 30 days:  No Current discharge risk:  Dependent with Mobility Barriers to Discharge:  Continued Medical Work up   [employer], Veronia Beets, [name] 08/13/2017, 4:22 PM

## 2017-08-14 DIAGNOSIS — Z9911 Dependence on respirator [ventilator] status: Secondary | ICD-10-CM | POA: Diagnosis not present

## 2017-08-14 DIAGNOSIS — I5032 Chronic diastolic (congestive) heart failure: Secondary | ICD-10-CM | POA: Diagnosis not present

## 2017-08-14 DIAGNOSIS — I272 Pulmonary hypertension, unspecified: Secondary | ICD-10-CM | POA: Diagnosis not present

## 2017-08-14 DIAGNOSIS — J9611 Chronic respiratory failure with hypoxia: Secondary | ICD-10-CM | POA: Diagnosis not present

## 2017-08-14 DIAGNOSIS — J432 Centrilobular emphysema: Secondary | ICD-10-CM | POA: Diagnosis not present

## 2017-08-14 DIAGNOSIS — G2581 Restless legs syndrome: Secondary | ICD-10-CM | POA: Diagnosis not present

## 2017-08-14 DIAGNOSIS — M6281 Muscle weakness (generalized): Secondary | ICD-10-CM | POA: Diagnosis not present

## 2017-08-14 DIAGNOSIS — J441 Chronic obstructive pulmonary disease with (acute) exacerbation: Secondary | ICD-10-CM | POA: Diagnosis not present

## 2017-08-14 DIAGNOSIS — J9610 Chronic respiratory failure, unspecified whether with hypoxia or hypercapnia: Secondary | ICD-10-CM | POA: Diagnosis not present

## 2017-08-14 DIAGNOSIS — J449 Chronic obstructive pulmonary disease, unspecified: Secondary | ICD-10-CM | POA: Diagnosis not present

## 2017-08-14 DIAGNOSIS — I509 Heart failure, unspecified: Secondary | ICD-10-CM | POA: Diagnosis not present

## 2017-08-14 DIAGNOSIS — Z9981 Dependence on supplemental oxygen: Secondary | ICD-10-CM | POA: Diagnosis not present

## 2017-08-14 MED ORDER — ALPRAZOLAM 1 MG PO TABS: 1.0000 mg | ORAL_TABLET | Freq: Three times a day (TID) | ORAL | 0 refills | Status: DC | PRN

## 2017-08-14 NOTE — Progress Notes (Signed)
Patient is medically stable for D/C [facility] today. Per Gavin Poundeborah admissions coordinator at [facility] patient can come today to room 317. RN will call report to [facility] nurses station and arrange EMS for transport. Clinical Child psychotherapistocial Worker (CSW) sent D/C orders to [facility] via [facility]. Patient is aware of above. CSW left patient's daughter Mardene CelesteJoanna a voicemail making her aware of above. Please reconsult if future social work needs arise. CSW signing off.   [employer]Bailey Murrel Bertram, [name] [phone]

## 2017-08-14 NOTE — Progress Notes (Signed)
Dustin [name] to be D/C'd Home per MD order.  Discussed with the patient and all questions fully answered.  VSS, Skin clean, dry and intact without evidence of skin break down, no evidence of skin tears noted. IV catheter discontinued intact. Site without signs and symptoms of complications. Dressing and pressure applied.  An After Visit Summary was printed and given to the patient. Patient received prescription.  D/c education completed with patient/family including follow up instructions, medication list, d/c activities limitations if indicated, with other d/c instructions as indicated by MD - patient able to verbalize understanding, all questions fully answered.   Patient instructed to return to ED, call 911, or call MD for any changes in condition.   Patient escorted via WC, and D/C home via private auto.  [name] 08/14/2017 10:15 AM

## 2017-08-14 NOTE — Discharge Summary (Signed)
[place] [facility] [place] NAME: Dustin [name]    MR#:  [id number]  DATE OF BIRTH:  [birth year]  DATE OF ADMISSION:  08/06/2017   ADMITTING PHYSICIAN: Loletha Grayer, MD  DATE OF DISCHARGE: 08/14/17  PRIMARY CARE PHYSICIAN: Lavera Guise, MD    ADMISSION DIAGNOSIS:  COPD exacerbation ([place]) [J44.1]  DISCHARGE DIAGNOSIS:  Active Problems:   COPD exacerbation (HCC)  Chronic hypoxic respiratory failure SECONDARY DIAGNOSIS:   Past Medical History:  Diagnosis Date  . Allergy   . Anxiety   . Anxiety   . Asthma   . Chronic diastolic CHF (congestive heart failure) ([place])    a. echo 07/2013: EF 60-65%, DD, biatrial dilatation, Ao sclerosis, dilated RV, moderate pulmonary HTN, elevated CV and RA pressures; b. patient reported echo at Dr. Laurelyn Sickle office 02/2015 - his office does not have record of him being a pt there c. echo 11/2015: EF 60-65%, Grade 1 DD, mod-severe pulm pressures  . Chronic respiratory failure (HCC)    a. on 2L via nasal cannula; b. secondary to COPD  . COPD (chronic obstructive pulmonary disease) ([place])   . Depression   . Depression   . Depression   . Emphysema of lung ([place])   . GERD (gastroesophageal reflux disease)   . Hypertension   . Personal history of tobacco use, presenting hazards to health 08/17/2015  . Tobacco abuse     HOSPITAL COURSE:  [name]  is a 61 y.o. male with a known history of COPD on chronic oxygen 2 L.  He was recently here in the hospital last month.  He was treated with a Zithromax and steroids and nebulizers for COPD exacerbation.  He went home and they titrated the steroids off.  He was doing well but then his phlegm became thick and his PMD put him on Levaquin for 7 days.  On Friday he was put on Bactrim.  He has been having a hard time breathing and fatigue.  He does smoke some cigarettes here and there.  He states he does have a fever at home and some chills and sweats.  In the  ER, his chest x-ray was negative.  Hospitalist services were contacted for further evaluation.  Marland Kitchen COPD exacerbation almost end-stage COPD  feeling much better today.  [facility] The patient is requiring 3 L of oxygen via nasal cannula, previously he was on 2 L of oxygen, discussed with case management and 3 L of oxygen and arranged .   medically stable to discharge Seen by pulmonology , outpatient follow up Dyspneic with minimal exertion  Solu-Medrol taper to p.o. steroids and bronchodilator therapy with DuoNeb's Patient must stop smoking.   Pro-calcitonin less than 0.10 not considering any antibiotics . theophylline level 5.0, below normal  Incentive spirometry  blood cultures negative.  2. Chronic respiratory failure - requiring 3 L of oxygen  3. Chronic diastolic congestive heart failure. Continue oral Lasix   4. Anxiety depression. PRN medication and Lexapro  5. Tobacco abuse. Smoking cessation counseling done 4 minutes by me.  6. Hand cramping and pain.   Negative ANA, rheumatoid factor and normal ESR    PT consult- reassessed pt - SNF today      DISCHARGE CONDITIONS:   stable CONSULTS OBTAINED:  Treatment Team:  Allyne Gee, MD   PROCEDURES NONE   DRUG ALLERGIES:   Allergies  Allergen Reactions  . Asa [Aspirin] Other (  See Comments)    Reaction: swelling of the right side.  . Bee Venom Swelling  . Codeine Other (See Comments)    Reaction: Difficulty breathing  . Ibuprofen Other (See Comments)    Reaction: Swelling of the right side.    DISCHARGE MEDICATIONS:   Current Discharge Medication List    START taking these medications   Details  predniSONE (STERAPRED UNI-PAK 21 TAB) 10 MG (21) TBPK tablet Take 1 tablet (10 mg total) by mouth daily. Take 6 tablets by mouth for 1 day followed by  5 tablets by mouth for 1 day followed by  4 tablets by mouth for 1 day followed by  3 tablets by mouth for 1  day followed by  2 tablets by mouth for 1 day followed by  1 tablet by mouth for a day and stop Qty: 21 tablet, Refills: 0      CONTINUE these medications which have CHANGED   Details  ALPRAZolam (XANAX) 1 MG tablet Take 1 tablet (1 mg total) by mouth 3 (three) times daily as needed for anxiety. Qty: 15 tablet, Refills: 0      CONTINUE these medications which have NOT CHANGED   Details  Cholecalciferol (VITAMIN D) 2000 units CAPS Take 2,000 Units by mouth daily.    escitalopram (LEXAPRO) 20 MG tablet Take 20 mg by mouth at bedtime.     furosemide (LASIX) 20 MG tablet Take 20 mg by mouth daily.     gabapentin (NEURONTIN) 300 MG capsule Take 300 mg 3 (three) times daily by mouth.     potassium chloride (K-DUR,KLOR-CON) 10 MEQ tablet Take 10 mEq by mouth once.    Probiotic Product (PROBIOTIC-10) CAPS Take 1 capsule by mouth 2 (two) times daily.     rOPINIRole (REQUIP) 0.25 MG tablet Take 1 tablet 3 (three) times daily by mouth.  Refills: 3    theophylline (UNIPHYL) 400 MG 24 hr tablet Take 400 mg by mouth daily.     Tiotropium Bromide Monohydrate (SPIRIVA RESPIMAT) 2.5 MCG/ACT AERS Inhale 2 puffs into the lungs daily.     Albuterol Sulfate (PROAIR RESPICLICK) 169 (90 BASE) MCG/ACT AEPB Inhale 2 puffs into the lungs every 4 (four) hours as needed (for shortness of breath). Reported on 12/10/2015    guaiFENesin (MUCINEX) 600 MG 12 hr tablet Take 1 tablet (600 mg total) by mouth 2 (two) times daily. Qty: 14 tablet, Refills: 0    ipratropium-albuterol (DUONEB) 0.5-2.5 (3) MG/3ML SOLN Take 3 mLs by nebulization 4 (four) times daily as needed (for shortness of breath).    mometasone-formoterol (DULERA) 200-5 MCG/ACT AERO Inhale 1 puff into the lungs 2 (two) times daily.       STOP taking these medications     predniSONE (DELTASONE) 50 MG tablet          DISCHARGE INSTRUCTIONS:   Follow-up with primary care physician in 1 week Follow-up with pulmonology in 1 week Quit  smoking Continue 3 L of oxygen via nasal cannula Home health PT   DIET:  Cardiac diet  DISCHARGE CONDITION:  Fair  ACTIVITY:  Activity as tolerated  OXYGEN:  Home Oxygen: Yes.     Oxygen Delivery: 3 liters/min via Patient connected to nasal cannula oxygen  DISCHARGE LOCATION:  home   If you experience worsening of your admission symptoms, develop shortness of breath, life threatening emergency, suicidal or homicidal thoughts you must seek medical attention immediately by calling 911 or calling your MD immediately  if symptoms less severe.  You Must read complete instructions/literature along with all the possible adverse reactions/side effects for all the Medicines you take and that have been prescribed to you. Take any new Medicines after you have completely understood and accpet all the possible adverse reactions/side effects.   Please note  You were cared for by a hospitalist during your hospital stay. If you have any questions about your discharge medications or the care you received while you were in the hospital after you are discharged, you can call the unit and asked to speak with the hospitalist on call if the hospitalist that took care of you is not available. Once you are discharged, your primary care physician will handle any further medical issues. Please note that NO REFILLS for any discharge medications will be authorized once you are discharged, as it is imperative that you return to your primary care physician (or establish a relationship with a primary care physician if you do not have one) for your aftercare needs so that they can reassess your need for medications and monitor your lab values.     Today  Chief Complaint  Patient presents with  . Shortness of Breath    Patient shortness of breath significantly improved.  Denies any tightness in the chest and requiring 3 L of oxygen via nasal cannula, which is arranged by the case manager Continues to smoke  advised to quit smoking.  Patient is stating that he prefers going rehab ROS:  CONSTITUTIONAL: Denies fevers, chills. Denies any fatigue, weakness.  EYES: Denies blurry vision, double vision, eye pain. EARS, NOSE, THROAT: Denies tinnitus, ear pain, hearing loss. RESPIRATORY: Denies cough, wheeze, shortness of breath.  CARDIOVASCULAR: Denies chest pain, palpitations, edema.  GASTROINTESTINAL: Denies nausea, vomiting, diarrhea, abdominal pain. Denies bright red blood per rectum. GENITOURINARY: Denies dysuria, hematuria. ENDOCRINE: Denies nocturia or thyroid problems. HEMATOLOGIC AND LYMPHATIC: Denies easy bruising or bleeding. SKIN: Denies rash or lesion. MUSCULOSKELETAL: Denies pain in neck, back, shoulder, knees, hips or arthritic symptoms.  NEUROLOGIC: Denies paralysis, paresthesias.  PSYCHIATRIC: Denies anxiety or depressive symptoms.   VITAL SIGNS:  Blood pressure 124/69, pulse 92, temperature 98.2 F (36.8 C), temperature source Oral, resp. rate 18, height '5\' 10"'  (1.778 m), weight 95.3 kg (210 lb), SpO2 91 %.  I/O:    Intake/Output Summary (Last 24 hours) at 08/14/2017 0800 Last data filed at 08/14/2017 8469 Gross per 24 hour  Intake 960 ml  Output 3000 ml  Net -2040 ml    PHYSICAL EXAMINATION:  GENERAL:  61 y.o.-year-old patient lying in the bed with no acute distress.  EYES: Pupils equal, round, reactive to light and accommodation. No scleral icterus. Extraocular muscles intact.  HEENT: Head atraumatic, normocephalic. Oropharynx and nasopharynx clear.  NECK:  Supple, no jugular venous distention. No thyroid enlargement, no tenderness.  LUNGS: Normal breath sounds bilaterally, no wheezing, rales,rhonchi or crepitation. No use of accessory muscles of respiration.  CARDIOVASCULAR: S1, S2 normal. No murmurs, rubs, or gallops.  ABDOMEN: Soft, non-tender, non-distended. Bowel sounds present. No organomegaly or mass.  EXTREMITIES: No pedal edema, cyanosis, or clubbing.   NEUROLOGIC: Cranial nerves II through XII are intact. Muscle strength 5/5 in all extremities. Sensation intact. Gait not checked.  PSYCHIATRIC: The patient is alert and oriented x 3.  SKIN: No obvious rash, lesion, or ulcer.   DATA REVIEW:   CBC No results for input(s): WBC, HGB, HCT, PLT in the last 168 hours.  Chemistries  Recent Labs  Lab 08/11/17 0832 08/13/17 0518  NA 139  --  K 4.6  --   CL 100*  --   CO2 31  --   GLUCOSE 120*  --   BUN 31*  --   CREATININE 1.07 1.07  CALCIUM 9.2  --     Cardiac Enzymes No results for input(s): TROPONINI in the last 168 hours.  Microbiology Results  Results for orders placed or performed during the hospital encounter of 08/06/17  Culture, blood (Routine x 2)     Status: None   Collection Time: 08/06/17  4:50 PM  Result Value Ref Range Status   Specimen Description BLOOD LEFT HAND  Final   Special Requests   Final    BOTTLES DRAWN AEROBIC AND ANAEROBIC Blood Culture results may not be optimal due to an excessive volume of blood received in culture bottles   Culture NO GROWTH 5 DAYS  Final   Report Status 08/11/2017 FINAL  Final  Culture, blood (Routine x 2)     Status: None   Collection Time: 08/06/17  4:50 PM  Result Value Ref Range Status   Specimen Description BLOOD RIGHT HAND  Final   Special Requests   Final    BOTTLES DRAWN AEROBIC AND ANAEROBIC Blood Culture results may not be optimal due to an excessive volume of blood received in culture bottles   Culture NO GROWTH 5 DAYS  Final   Report Status 08/11/2017 FINAL  Final    RADIOLOGY:  No results found.  EKG:   Orders placed or performed during the hospital encounter of 06/28/17  . ED EKG  . ED EKG  . EKG 12-Lead  . EKG 12-Lead  . EKG      Management plans discussed with the patient, family and they are in agreement.  CODE STATUS:     Code Status Orders  (From admission, onward)        Start     Ordered   08/06/17 1822  Full code  Continuous      08/06/17 1822    Code Status History    Date Active Date Inactive Code Status Order ID Comments User Context   06/28/2017 23:04 07/03/2017 21:13 Full Code [id number]  Dustin Flock, MD ED   01/22/2017 21:29 01/29/2017 19:44 Full Code [id number]  Henreitta Leber, MD Inpatient   06/23/2016 00:01 06/28/2016 19:43 Full Code [id number]  Lance Coon, MD Inpatient   03/25/2016 17:03 03/31/2016 23:08 Full Code [id number]  Samuella Cota, MD Inpatient   11/30/2015 05:12 12/06/2015 21:34 Full Code [id number]  Harrie Foreman, MD Inpatient   04/13/2015 17:22 04/21/2015 19:40 Full Code [id number]  Dustin Flock, MD Inpatient   03/31/2015 20:50 04/05/2015 20:48 Full Code [id number]  Hower, Aaron Mose, MD ED      TOTAL TIME TAKING CARE OF THIS PATIENT: 45 minutes.   Note: This dictation was prepared with Dragon dictation along with smaller phrase technology. Any transcriptional errors that result from this process are unintentional.   '@[facility]' @  on 08/14/2017 at 8:00 AM  Between 7am to 6pm - Pager - [phone]  After 6pm go to [email or web address] - password [email or web address] [facility]  [facility]  Office  [phone]  CC: Primary care physician; Lavera Guise, MD

## 2017-08-14 NOTE — Care Management (Signed)
[name] notified of discharge to SNF today

## 2017-08-14 NOTE — Progress Notes (Signed)
Patient alert and oriented. Expiratory wheezing on auscultation. Non labored breathing. On 2LO2 chronic. Denies pain. Oriented to call bell.   Dustin HeckMelanie Kewan Mcnease, RN

## 2017-08-14 NOTE — Clinical Social Work Placement (Signed)
   CLINICAL SOCIAL WORK PLACEMENT  NOTE  Date:  08/14/2017  Patient Details  Name: Dustin [name] MRN: [id number] Date of Birth: 25-Feb-1956  Clinical Social Work is seeking post-discharge placement for this patient at the Skilled  Nursing Facility level of care (*CSW will initial, date and re-position this form in  chart as items are completed):  Yes   Patient/family provided with [facility] Department's list of facilities offering this level of care within the geographic area requested by the patient (or if unable, by the patient's family).  Yes   Patient/family informed of their freedom to choose among providers that offer the needed level of care, that participate in Medicare, Medicaid or managed care program needed by the patient, have an available bed and are willing to accept the patient.  Yes   Patient/family informed of [facility]'s ownership interest in [facility] and [facility], as well as of the fact that they are under no obligation to receive care at these facilities.  PASRR submitted to EDS on       PASRR number received on       Existing PASRR number confirmed on 08/13/17     FL2 transmitted to all facilities in geographic area requested by pt/family on 08/13/17     FL2 transmitted to all facilities within larger geographic area on       Patient informed that his/her managed care company has contracts with or will negotiate with certain facilities, including the following:        Yes   Patient/family informed of bed offers received.  Patient chooses bed at [facility] )     Physician recommends and patient chooses bed at      Patient to be transferred to [facility]) on 08/14/17.  Patient to be transferred to facility by [facility] )     Patient family notified on 08/14/17 of transfer.  Name of family member notified:  (CSW left patient's daughter Randa EvensJoanne a voicemail making her aware of D/C today. )      PHYSICIAN       Additional Comment:    _______________________________________________ Ether Goebel, [name], [name] 08/14/2017, 9:08 AM

## 2017-08-23 ENCOUNTER — Other Ambulatory Visit: Payer: Self-pay

## 2017-08-23 NOTE — Patient Outreach (Signed)
[name] Care Management  08/23/2017  Dustin CoriaDennis R [name] 1955/10/20 [id number]   Telephone call to patient.  He reports that he is in [facility] [place].    Plan: RN CM will have care management assistant reassign patient to social work for outreach.    Bary Lericheionne J Lemon Whitacre, RN, MSN [facility] Care Management Care Management Coordinator Direct Line [phone] Toll Free: [phone]  Fax: [phone]

## 2017-08-24 ENCOUNTER — Other Ambulatory Visit: Payer: Self-pay | Admitting: *Deleted

## 2017-08-24 NOTE — Patient Outreach (Signed)
[name] Care Management  08/24/2017  Hershal CoriaDennis R Dery 04/08/1956 [id number]   [facility] to explore any developing discharge plans for patient. This Child psychotherapistsocial worker spoke with Tiffany in the Business office in the absence of the discharge planner. Per Tiffany patient is scheduled to discharge home on 09/01/17.  [facility] , however per Elmarie Shileyiffany, he lives with his daughter who is able to provide 24 hour care and [facility] not [facility]. Patient has no DME needs, per Elmarie Shileyiffany he has all his equipment that was previously ordered.   Plan: This Child psychotherapistsocial worker will follow up with patient at the facility within 1 week to assess for any community resource needs.   Adriana ReamsChrystal Land, [name] [facility] Care Management [phone]

## 2017-08-28 ENCOUNTER — Ambulatory Visit: Payer: Self-pay | Admitting: *Deleted

## 2017-08-29 ENCOUNTER — Ambulatory Visit: Payer: Self-pay | Admitting: *Deleted

## 2017-08-30 ENCOUNTER — Other Ambulatory Visit: Payer: Self-pay | Admitting: *Deleted

## 2017-08-30 ENCOUNTER — Ambulatory Visit (INDEPENDENT_AMBULATORY_CARE_PROVIDER_SITE_OTHER): Payer: Medicare Other | Admitting: Internal Medicine

## 2017-08-30 ENCOUNTER — Encounter: Payer: Self-pay | Admitting: Internal Medicine

## 2017-08-30 VITALS — BP 124/71 | Temp 98.1°F | Resp 16 | Ht 70.0 in | Wt 224.2 lb

## 2017-08-30 DIAGNOSIS — I272 Pulmonary hypertension, unspecified: Secondary | ICD-10-CM | POA: Diagnosis not present

## 2017-08-30 DIAGNOSIS — I5032 Chronic diastolic (congestive) heart failure: Secondary | ICD-10-CM

## 2017-08-30 DIAGNOSIS — J432 Centrilobular emphysema: Secondary | ICD-10-CM | POA: Diagnosis not present

## 2017-08-30 DIAGNOSIS — J9611 Chronic respiratory failure with hypoxia: Secondary | ICD-10-CM | POA: Diagnosis not present

## 2017-08-30 NOTE — Patient Outreach (Addendum)
[name] Care Management  08/30/2017  Hershal CoriaDennis R Gaige 24-Mar-1956 [id number]   In basket message received today  by [facility] who informed this social worker that patient will be [facility].  Visit originally scheduled for 08/31/17 at the SNF.  Plan: late entry This social worker will refer patient to telephonic RNCM for screening.   Adriana ReamsChrystal Asuna Peth, [name] [facility] Care Management [phone]

## 2017-08-30 NOTE — Patient Instructions (Signed)
Chronic Obstructive Pulmonary Disease Chronic obstructive pulmonary disease (COPD) is a long-term (chronic) lung problem. When you have COPD, it is hard for air to get in and out of your lungs. The way your lungs work will never return to normal. Usually the condition gets worse over time. There are things you can do to keep yourself as healthy as possible. Your doctor may treat your condition with:  Medicines.  Quitting smoking, if you smoke.  Rehabilitation. This may involve a team of specialists.  Oxygen.  Exercise and changes to your diet.  Lung surgery.  Comfort measures (palliative care).  Follow these instructions at home: Medicines  Take over-the-counter and prescription medicines only as told by your doctor.  Talk to your doctor before taking any cough or allergy medicines. You may need to avoid medicines that cause your lungs to be dry. Lifestyle  If you smoke, stop. Smoking makes the problem worse. If you need help quitting, ask your doctor.  Avoid being around things that make your breathing worse. This may include smoke, chemicals, and fumes.  Stay active, but remember to also rest.  Learn and use tips on how to relax.  Make sure you get enough sleep. Most adults need at least 7 hours a night.  Eat healthy foods. Eat smaller meals more often. Rest before meals. Controlled breathing  Learn and use tips on how to control your breathing as told by your doctor. Try: ? Breathing in (inhaling) through your nose for 1 second. Then, pucker your lips and breath out (exhale) through your lips for 2 seconds. ? Putting one hand on your belly (abdomen). Breathe in slowly through your nose for 1 second. Your hand on your belly should move out. Pucker your lips and breathe out slowly through your lips. Your hand on your belly should move in as you breathe out. Controlled coughing  Learn and use controlled coughing to clear mucus from your lungs. The steps are: 1. Lean your  head a little forward. 2. Breathe in deeply. 3. Try to hold your breath for 3 seconds. 4. Keep your mouth slightly open while coughing 2 times. 5. Spit any mucus out into a tissue. 6. Rest and do the steps again 1 or 2 times as needed. General instructions  Make sure you get all the shots (vaccines) that your doctor recommends. Ask your doctor about a flu shot and a pneumonia shot.  Use oxygen therapy and therapy to help improve your lungs (pulmonary rehabilitation) if told by your doctor. If you need home oxygen therapy, ask your doctor if you should buy a tool to measure your oxygen level (oximeter).  Make a COPD action plan with your doctor. This helps you know what to do if you feel worse than usual.  Manage any other conditions you have as told by your doctor.  Avoid going outside when it is very hot, cold, or humid.  Avoid people who have a sickness you can catch (contagious).  Keep all follow-up visits as told by your doctor. This is important. Contact a doctor if:  You cough up more mucus than usual.  There is a change in the color or thickness of the mucus.  It is harder to breathe than usual.  Your breathing is faster than usual.  You have trouble sleeping.  You need to use your medicines more often than usual.  You have trouble doing your normal activities such as getting dressed or walking around the house. Get help right away if:    You have shortness of breath while resting.  You have shortness of breath that stops you from: ? Being able to talk. ? Doing normal activities.  Your chest hurts for longer than 5 minutes.  Your skin color is more blue than usual.  Your pulse oximeter shows that you have low oxygen for longer than 5 minutes.  You have a fever.  You feel too tired to breathe normally. Summary  Chronic obstructive pulmonary disease (COPD) is a long-term lung problem.  The way your lungs work will never return to normal. Usually the  condition gets worse over time. There are things you can do to keep yourself as healthy as possible.  Take over-the-counter and prescription medicines only as told by your doctor.  If you smoke, stop. Smoking makes the problem worse. This information is not intended to replace advice given to you by your health care provider. Make sure you discuss any questions you have with your health care provider. Document Released: 02/21/2008 Document Revised: 02/10/2016 Document Reviewed: 05/01/2013 Elsevier Interactive Patient Education  2017 [employer] Pulmonary Hypertension Pulmonary hypertension is high blood pressure within the arteries in your lungs (pulmonary arteries). It is different than having high blood pressure elsewhere in your body, such as blood pressure that is measured with a blood pressure cuff. Pulmonary hypertension makes it harder for blood to flow through the lungs. As a result, the heart must work harder to pump blood through the lungs, and it may be harder for you to breathe. Over time, this can weaken the heart muscle. Pulmonary hypertension is a serious condition and it can be fatal. What are the causes? Many different medical conditions can cause pulmonary hypertension. Pulmonary hypertension can be categorized by cause into five groups: Group 1 Pulmonary hypertension that is caused by abnormal growth of small blood vessels in the lungs (pulmonary arterial hypertension). The abnormal blood vessel growth may have no known cause, or it may be:  Passed along from a parent (hereditary).  Caused by another disease, such as a connective tissue disease (including lupus or scleroderma) or HIV.  Caused by certain drugs or toxins.  Group 2 Pulmonary hypertension that is caused by weakness of the main chamber of the heart (left ventricle) or heart valve disease. Group 3 Pulmonary hypertension that is caused by lung disease or low oxygen levels. Causes in this group  include:  Emphysema or chronic obstructive pulmonary disease (COPD).  Untreated sleep apnea.  Pulmonary fibrosis.  Group 4 Pulmonary hypertension that is caused by blood clots in the lungs (pulmonary emboli). Group 5 Other causes of pulmonary hypertension, such as sickle cell anemia, or a mix of multiple causes. What are the signs or symptoms? Symptoms of this condition include:  Shortness of breath. You may notice shortness of breath with: ? Activity, such as walking. ? No activity.  Tiredness and fatigue.  Dizziness or fainting.  Rapid heartbeat or feeling your heart flutter or skip a beat (palpitations).  Neck vein enlargement.  Bluish color to your lips and fingertips.  How is this diagnosed? This condition may be diagnosed by:  Chest X-ray.  Arterial blood gases. This test checks the acidity of your blood as well as your blood oxygen and carbon dioxide levels.  CT scan. This test can provide detailed images of your lungs.  Pulmonary function test. This test measures how much air your lungs can hold. It also tests how well air moves in and out of your lungs.  Electrocardiogram (ECG). This test  traces the electrical activity of your heart.  Echocardiogram. This test is used to look at your heart in motion and check how it is functioning.  Heart catheterization. This test can measure the pressure in your pulmonary artery and the right side of your heart.  Lung biopsy. This procedure involves checking a sample of lung tissue to find underlying causes.  How is this treated? There is no cure for pulmonary hypertension, but treatment can help to relieve symptoms and slow the progress of the condition. Treatment can involve:  Medicines, such as: ? Blood pressure medicines. ? Medicines to relax (dilate) the pulmonary blood vessels. ? Water pills to get rid of extra fluid (diuretic medicines). ? Blood-thinning medicines.  Surgery. For severe pulmonary hypertension  that does not respond to medical treatment, heart-lung or lung transplant may be needed.  Follow these instructions at home:  Take medicines only as directed by your health care provider. These include over-the-counter medicines and prescription medicines. Take all medicines exactly as instructed. Do not change or stop medicines without first checking with your health care provider.  Do not smoke. If you need help quitting, ask your health care provider.  Eat a healthy diet.  Limit your salt (sodium) intake to less than 2,300 mg per day.  Stay as active as possible. Exercise as directed by your health care provider. Talk with your health care provider about what type of exercise is safe for you.  Avoid high altitudes.  Avoid hot tubs and saunas.  Avoid becoming pregnant, if this applies. Talk with your health care provider about safe methods of birth control.  Keep all follow-up visits as directed by your health care provider. This is important. Get help right away if:  You have severe shortness of breath.  You develop chest pain or pressure in your chest.  You cough up blood.  You develop swelling of your feet or legs.  You have a significant increase in weight within 1-2 days. This information is not intended to replace advice given to you by your health care provider. Make sure you discuss any questions you have with your health care provider. Document Released: 07/02/2007 Document Revised: 03/24/2016 Document Reviewed: 02/24/2013 Elsevier Interactive Patient Education  2018 [employer]

## 2017-08-30 NOTE — Progress Notes (Signed)
[facility] [place], [place] [address]  Pulmonary Sleep Medicine  Office Visit Note  Patient Name: Dustin [name]  [id number]  [id number]  Date of Service: '@SERVICEDATE' @     Complaints/HPI: He is doing well since he was discharged from the hospital. Patient staets he has been in rehab and doing failry well. Has been on 2lpm with good saturations noted. Patient has no cough no congestion noted other than occasionally. Patient has no fever noted. Patient has no chest pain noted. Patient has noted slight edema. Currently his SaO2 is 97%  Current Medication: Outpatient Encounter Medications as of 08/30/2017  Medication Sig  . Albuterol Sulfate (PROAIR RESPICLICK) 086 (90 BASE) MCG/ACT AEPB Inhale 2 puffs into the lungs every 4 (four) hours as needed (for shortness of breath). Reported on 12/10/2015  . ALPRAZolam (XANAX) 1 MG tablet Take 1 tablet (1 mg total) by mouth 3 (three) times daily as needed for anxiety.  . Cholecalciferol (VITAMIN D) 2000 units CAPS Take 2,000 Units by mouth daily.  Marland Kitchen escitalopram (LEXAPRO) 20 MG tablet Take 20 mg by mouth at bedtime.   . furosemide (LASIX) 20 MG tablet Take 20 mg by mouth daily.   Marland Kitchen gabapentin (NEURONTIN) 300 MG capsule Take 300 mg 3 (three) times daily by mouth.   Marland Kitchen guaiFENesin (MUCINEX) 600 MG 12 hr tablet Take 1 tablet (600 mg total) by mouth 2 (two) times daily.  Marland Kitchen ipratropium-albuterol (DUONEB) 0.5-2.5 (3) MG/3ML SOLN Take 3 mLs by nebulization 4 (four) times daily as needed (for shortness of breath).  . mometasone-formoterol (DULERA) 200-5 MCG/ACT AERO Inhale 1 puff into the lungs 2 (two) times daily.   . potassium chloride (K-DUR,KLOR-CON) 10 MEQ tablet Take 10 mEq by mouth once.  . predniSONE (STERAPRED UNI-PAK 21 TAB) 10 MG (21) TBPK tablet Take 1 tablet (10 mg total) by mouth daily. Take 6 tablets by mouth for 1 day followed by  5 tablets by mouth for 1 day followed by  4 tablets by mouth for 1 day followed by   3 tablets by mouth for 1 day followed by  2 tablets by mouth for 1 day followed by  1 tablet by mouth for a day and stop  . Probiotic Product (PROBIOTIC-10) CAPS Take 1 capsule by mouth 2 (two) times daily.   Marland Kitchen rOPINIRole (REQUIP) 0.25 MG tablet Take 1 tablet 3 (three) times daily by mouth.   . theophylline (UNIPHYL) 400 MG 24 hr tablet Take 400 mg by mouth daily.   . Tiotropium Bromide Monohydrate (SPIRIVA RESPIMAT) 2.5 MCG/ACT AERS Inhale 2 puffs into the lungs daily.    No facility-administered encounter medications on file as of 08/30/2017.     Surgical History: Past Surgical History:  Procedure Laterality Date  . ABDOMINAL SURGERY    . ADENOIDECTOMY    . CARPAL TUNNEL RELEASE Right   . corpal tunnel Right   . ELBOW SURGERY Right    repaired tendon  . hemmorhoid N/A   . hemorrh    . NASAL SINUS SURGERY    . NOSE SURGERY    . reflux surgery    . ROTATOR CUFF REPAIR Right   . SHOULDER SURGERY    . TENNIS ELBOW RELEASE/NIRSCHEL PROCEDURE Right   . URETHRA SURGERY     surgery 6 times from age [age] yrs old  . URETHRA SURGERY      Medical History: Past Medical History:  Diagnosis Date  . Allergy   . Anxiety   .  Anxiety   . Asthma   . Chronic diastolic CHF (congestive heart failure) ([place])    a. echo 07/2013: EF 60-65%, DD, biatrial dilatation, Ao sclerosis, dilated RV, moderate pulmonary HTN, elevated CV and RA pressures; b. patient reported echo at Dr. Laurelyn Sickle office 02/2015 - his office does not have record of him being a pt there c. echo 11/2015: EF 60-65%, Grade 1 DD, mod-severe pulm pressures  . Chronic respiratory failure (HCC)    a. on 2L via nasal cannula; b. secondary to COPD  . COPD (chronic obstructive pulmonary disease) ([place])   . Depression   . Depression   . Depression   . Emphysema of lung ([place])   . GERD (gastroesophageal reflux disease)   . Hypertension   . Personal history of tobacco use, presenting hazards to health 08/17/2015  . Tobacco abuse      Family History: Family History  Problem Relation Age of Onset  . CAD Father   . Hyperlipidemia Father   . Stroke Father   . Heart disease Father   . Hypertension Mother   . Peripheral Artery Disease Mother   . Rheum arthritis Mother   . Asthma Mother   . Bipolar disorder Mother   . Depression Mother   . Malignant hypertension Mother   . Diabetes Neg Hx     Social History: Social History   Socioeconomic History  . Marital status: Legally Separated    Spouse name: Not on file  . Number of children: Not on file  . Years of education: Not on file  . Highest education level: Not on file  Social Needs  . Financial resource strain: Not on file  . Food insecurity - worry: Not on file  . Food insecurity - inability: Not on file  . Transportation needs - medical: Not on file  . Transportation needs - non-medical: Not on file  Occupational History  . Not on file  Tobacco Use  . Smoking status: Former Smoker    Packs/day: 0.10    Years: 43.00    Pack years: 4.30    Types: E-cigarettes, Cigarettes  . Smokeless tobacco: Never Used  Substance and Sexual Activity  . Alcohol use: No  . Drug use: No  . Sexual activity: Not on file  Other Topics Concern  . Not on file  Social History Narrative  . Not on file    ROS  General: (-) fever, (-) chills, (-) night sweats, (-) weakness, (-) changes in appetite. Skin: (-) rashes, (-) itching,. Eyes: (-) visual changes, (-) redness, (-) itching, (-) double or blurred vision. Nose and Sinuses: (-) nasal stuffiness or itchiness, (-) postnasal drip, (-) nosebleeds, (-) sinus trouble. Mouth and Throat: (-) sore throat, (-) hoarseness. Neck: (-) swollen glands, (-) enlarged thyroid, (-) neck pain. Respiratory: (-) cough, (-) bloody sputum, (-) shortness of breath, (-) wheezing. Cardiovascular: (-) ankle swelling, (-) chest pain. Lymphatic: (-) lymph node enlargement, (-) lymph node tenderness. Neurologic: (-) numbness, (-)  tingling,(-) dizziness. Psychiatric: (-) anxiety, (-) depression.  Vital Signs: Blood pressure 124/71, temperature 98.1 F (36.7 C), temperature source Oral, resp. rate 16, height '5\' 10"'  (1.778 m), weight 224 lb 3.2 oz (101.7 kg), SpO2 97 %.  Examination: General Appearance: The patient is well-developed, well-nourished, and in no distress. Skin: Gross inspection of skin demonstrates no evidence of abnormality. Head: Patient's head is normocephalic, no gross deformities. Eyes: no gross deformities noted. ENT: ears appear grossly normal. Nasopharynx appears to be normal. Neck: Supple. No  thyromegaly. No LAD. Respiratory: Lungs are clear to auscultation with no adventitious sounds. Cardiovascular: Normal S1 and S2 without murmur or rub. Extremities: No cyanosis. pulses are equal. Neurologic: Alert and oriented. No involuntary movements.  LABS: Recent Results (from the past 2160 hour(s))  I-STAT creatinine     Status: None   Collection Time: 06/11/17  9:42 AM  Result Value Ref Range   Creatinine, Ser 1.10 0.61 - 1.24 mg/dL  Comprehensive metabolic panel     Status: Abnormal   Collection Time: 06/28/17  5:21 PM  Result Value Ref Range   Sodium 140 135 - 145 mmol/L   Potassium 4.1 3.5 - 5.1 mmol/L   Chloride 99 (L) 101 - 111 mmol/L   CO2 33 (H) 22 - 32 mmol/L   Glucose, Bld 106 (H) 65 - 99 mg/dL   BUN 17 6 - 20 mg/dL   Creatinine, Ser 1.15 0.61 - 1.24 mg/dL   Calcium 9.1 8.9 - 10.3 mg/dL   Total Protein 6.8 6.5 - 8.1 g/dL   Albumin 3.8 3.5 - 5.0 g/dL   AST 20 15 - 41 U/L   ALT 14 (L) 17 - 63 U/L   Alkaline Phosphatase 59 38 - 126 U/L   Total Bilirubin 0.7 0.3 - 1.2 mg/dL   GFR calc non Af Amer >60 >60 mL/min   GFR calc Af Amer >60 >60 mL/min    Comment: (NOTE) The eGFR has been calculated using the CKD EPI equation. This calculation has not been validated in all clinical situations. eGFR's persistently <60 mL/min signify possible Chronic Kidney Disease.    Anion gap 8 5 -  15  Brain natriuretic peptide     Status: None   Collection Time: 06/28/17  5:21 PM  Result Value Ref Range   B Natriuretic Peptide 68.0 0.0 - 100.0 pg/mL  Troponin I     Status: None   Collection Time: 06/28/17  5:21 PM  Result Value Ref Range   Troponin I <0.03 <0.03 ng/mL  CBC with Differential     Status: Abnormal   Collection Time: 06/28/17  5:21 PM  Result Value Ref Range   WBC 14.8 (H) 3.8 - 10.6 K/uL   RBC 4.25 (L) 4.40 - 5.90 MIL/uL   Hemoglobin 13.3 13.0 - 18.0 g/dL   HCT 41.0 40.0 - 52.0 %   MCV 96.4 80.0 - 100.0 fL   MCH 31.3 26.0 - 34.0 pg   MCHC 32.5 32.0 - 36.0 g/dL   RDW 13.9 11.5 - 14.5 %   Platelets 162 150 - 440 K/uL   Neutrophils Relative % 84 %   Neutro Abs 12.4 (H) 1.4 - 6.5 K/uL   Lymphocytes Relative 9 %   Lymphs Abs 1.3 1.0 - 3.6 K/uL   Monocytes Relative 6 %   Monocytes Absolute 0.9 0.2 - 1.0 K/uL   Eosinophils Relative 1 %   Eosinophils Absolute 0.1 0 - 0.7 K/uL   Basophils Relative 0 %   Basophils Absolute 0.1 0 - 0.1 K/uL  CBC     Status: Abnormal   Collection Time: 06/29/17  3:51 AM  Result Value Ref Range   WBC 9.1 3.8 - 10.6 K/uL   RBC 4.20 (L) 4.40 - 5.90 MIL/uL   Hemoglobin 13.5 13.0 - 18.0 g/dL   HCT 40.1 40.0 - 52.0 %   MCV 95.6 80.0 - 100.0 fL   MCH 32.1 26.0 - 34.0 pg   MCHC 33.6 32.0 - 36.0 g/dL   RDW 13.6  11.5 - 14.5 %   Platelets 155 150 - 440 K/uL  Basic metabolic panel     Status: Abnormal   Collection Time: 06/29/17  3:51 AM  Result Value Ref Range   Sodium 142 135 - 145 mmol/L   Potassium 4.8 3.5 - 5.1 mmol/L   Chloride 105 101 - 111 mmol/L   CO2 33 (H) 22 - 32 mmol/L   Glucose, Bld 145 (H) 65 - 99 mg/dL   BUN 18 6 - 20 mg/dL   Creatinine, Ser 0.87 0.61 - 1.24 mg/dL   Calcium 8.9 8.9 - 10.3 mg/dL   GFR calc non Af Amer >60 >60 mL/min   GFR calc Af Amer >60 >60 mL/min    Comment: (NOTE) The eGFR has been calculated using the CKD EPI equation. This calculation has not been validated in all clinical  situations. eGFR's persistently <60 mL/min signify possible Chronic Kidney Disease.    Anion gap 4 (L) 5 - 15  Culture, sputum-assessment     Status: None   Collection Time: 06/30/17  8:57 AM  Result Value Ref Range   Specimen Description SPUTUM    Special Requests NONE    Sputum evaluation      Sputum specimen not acceptable for testing.  Please recollect.   Notified Helen Hashimoto @ [phone] 07/31/17 [place]    Report Status 06/30/2017 FINAL   Culture, expectorated sputum-assessment     Status: None   Collection Time: 06/30/17 11:55 AM  Result Value Ref Range   Specimen Description EXPECTORATED SPUTUM    Special Requests NONE    Sputum evaluation THIS SPECIMEN IS ACCEPTABLE FOR SPUTUM CULTURE    Report Status 06/30/2017 FINAL   Culture, respiratory (NON-Expectorated)     Status: None   Collection Time: 06/30/17 11:55 AM  Result Value Ref Range   Specimen Description EXPECTORATED SPUTUM    Special Requests NONE Reflexed from [id number]    Gram Stain      ABUNDANT WBC PRESENT, PREDOMINANTLY PMN RARE SQUAMOUS EPITHELIAL CELLS PRESENT ABUNDANT GRAM POSITIVE RODS FEW GRAM POSITIVE COCCI RARE GRAM NEGATIVE RODS    Culture      Consistent with normal respiratory flora. Performed at [facility], [place] [address], [place], [place] [address]    Report Status 07/03/2017 FINAL   Creatinine, serum     Status: None   Collection Time: 07/03/17  4:45 AM  Result Value Ref Range   Creatinine, Ser 1.07 0.61 - 1.24 mg/dL   GFR calc non Af Amer >60 >60 mL/min   GFR calc Af Amer >60 >60 mL/min    Comment: (NOTE) The eGFR has been calculated using the CKD EPI equation. This calculation has not been validated in all clinical situations. eGFR's persistently <60 mL/min signify possible Chronic Kidney Disease.   Comprehensive metabolic panel     Status: Abnormal   Collection Time: 08/06/17  4:50 PM  Result Value Ref Range   Sodium 138 135 - 145 mmol/L   Potassium 4.0 3.5 - 5.1 mmol/L    Chloride 99 (L) 101 - 111 mmol/L   CO2 30 22 - 32 mmol/L   Glucose, Bld 113 (H) 65 - 99 mg/dL   BUN 10 6 - 20 mg/dL   Creatinine, Ser 1.27 (H) 0.61 - 1.24 mg/dL   Calcium 9.1 8.9 - 10.3 mg/dL   Total Protein 6.7 6.5 - 8.1 g/dL   Albumin 4.0 3.5 - 5.0 g/dL   AST 29 15 - 41 U/L   ALT  21 17 - 63 U/L   Alkaline Phosphatase 61 38 - 126 U/L   Total Bilirubin 0.5 0.3 - 1.2 mg/dL   GFR calc non Af Amer 58 (L) >60 mL/min   GFR calc Af Amer >60 >60 mL/min    Comment: (NOTE) The eGFR has been calculated using the CKD EPI equation. This calculation has not been validated in all clinical situations. eGFR's persistently <60 mL/min signify possible Chronic Kidney Disease.    Anion gap 9 5 - 15  Lactic acid, plasma     Status: None   Collection Time: 08/06/17  4:50 PM  Result Value Ref Range   Lactic Acid, Venous 1.0 0.5 - 1.9 mmol/L  CBC with Differential     Status: Abnormal   Collection Time: 08/06/17  4:50 PM  Result Value Ref Range   WBC 5.3 3.8 - 10.6 K/uL   RBC 4.15 (L) 4.40 - 5.90 MIL/uL   Hemoglobin 13.6 13.0 - 18.0 g/dL   HCT 40.3 40.0 - 52.0 %   MCV 97.0 80.0 - 100.0 fL   MCH 32.7 26.0 - 34.0 pg   MCHC 33.7 32.0 - 36.0 g/dL   RDW 14.9 (H) 11.5 - 14.5 %   Platelets 202 150 - 440 K/uL   Neutrophils Relative % 64 %   Neutro Abs 3.4 1.4 - 6.5 K/uL   Lymphocytes Relative 22 %   Lymphs Abs 1.2 1.0 - 3.6 K/uL   Monocytes Relative 12 %   Monocytes Absolute 0.6 0.2 - 1.0 K/uL   Eosinophils Relative 1 %   Eosinophils Absolute 0.0 0 - 0.7 K/uL   Basophils Relative 1 %   Basophils Absolute 0.0 0 - 0.1 K/uL  Protime-INR     Status: None   Collection Time: 08/06/17  4:50 PM  Result Value Ref Range   Prothrombin Time 13.3 11.4 - 15.2 seconds   INR 1.02   Culture, blood (Routine x 2)     Status: None   Collection Time: 08/06/17  4:50 PM  Result Value Ref Range   Specimen Description BLOOD LEFT HAND    Special Requests      BOTTLES DRAWN AEROBIC AND ANAEROBIC Blood Culture results  may not be optimal due to an excessive volume of blood received in culture bottles   Culture NO GROWTH 5 DAYS    Report Status 08/11/2017 FINAL   Culture, blood (Routine x 2)     Status: None   Collection Time: 08/06/17  4:50 PM  Result Value Ref Range   Specimen Description BLOOD RIGHT HAND    Special Requests      BOTTLES DRAWN AEROBIC AND ANAEROBIC Blood Culture results may not be optimal due to an excessive volume of blood received in culture bottles   Culture NO GROWTH 5 DAYS    Report Status 08/11/2017 FINAL   Procalcitonin - Baseline     Status: None   Collection Time: 08/06/17  4:50 PM  Result Value Ref Range   Procalcitonin <0.10 ng/mL    Comment:        Interpretation: PCT (Procalcitonin) <= 0.5 ng/mL: Systemic infection (sepsis) is not likely. Local bacterial infection is possible. (NOTE)         ICU PCT Algorithm               Non ICU PCT Algorithm    ----------------------------     ------------------------------         PCT < 0.25 ng/mL  PCT < 0.1 ng/mL     Stopping of antibiotics            Stopping of antibiotics       strongly encouraged.               strongly encouraged.    ----------------------------     ------------------------------       PCT level decrease by               PCT < 0.25 ng/mL       >= 80% from peak PCT       OR PCT 0.25 - 0.5 ng/mL          Stopping of antibiotics                                             encouraged.     Stopping of antibiotics           encouraged.    ----------------------------     ------------------------------       PCT level decrease by              PCT >= 0.25 ng/mL       < 80% from peak PCT        AND PCT >= 0.5 ng/mL            Continuin g antibiotics                                              encouraged.       Continuing antibiotics            encouraged.    ----------------------------     ------------------------------     PCT level increase compared          PCT > 0.5 ng/mL         with  peak PCT AND          PCT >= 0.5 ng/mL             Escalation of antibiotics                                          strongly encouraged.      Escalation of antibiotics        strongly encouraged.   Theophylline level     Status: Abnormal   Collection Time: 08/06/17  4:57 PM  Result Value Ref Range   Theophylline Lvl 5.0 (L) 10.0 - 20.0 ug/mL    Comment: (NOTE)                                Detection Limit =  0.9                          <0.9 indicates None Detected Performed At: [facility] [address] [place], [place] [id number] Rush Farmer MD FU:[id number]   Urinalysis, Complete w Microscopic     Status: Abnormal   Collection Time: 08/06/17  6:30 PM  Result Value Ref Range   Color,  Urine STRAW (A) YELLOW   APPearance CLEAR (A) CLEAR   Specific Gravity, Urine 1.005 1.005 - 1.030   pH 7.0 5.0 - 8.0   Glucose, UA NEGATIVE NEGATIVE mg/dL   Hgb urine dipstick NEGATIVE NEGATIVE   Bilirubin Urine NEGATIVE NEGATIVE   Ketones, ur NEGATIVE NEGATIVE mg/dL   Protein, ur NEGATIVE NEGATIVE mg/dL   Nitrite NEGATIVE NEGATIVE   Leukocytes, UA NEGATIVE NEGATIVE   RBC / HPF NONE SEEN 0 - 5 RBC/hpf   WBC, UA 0-5 0 - 5 WBC/hpf   Bacteria, UA NONE SEEN NONE SEEN   Squamous Epithelial / LPF 0-5 (A) NONE SEEN  Lactic acid, plasma     Status: Abnormal   Collection Time: 08/06/17  8:27 PM  Result Value Ref Range   Lactic Acid, Venous 2.7 (HH) 0.5 - 1.9 mmol/L    Comment: CRITICAL RESULT CALLED TO, READ BACK BY AND VERIFIED WITH DEBORAH GLADDING 08/06/17 @ 2114  [place]   Procalcitonin     Status: None   Collection Time: 08/07/17 12:08 AM  Result Value Ref Range   Procalcitonin <0.10 ng/mL    Comment:        Interpretation: PCT (Procalcitonin) <= 0.5 ng/mL: Systemic infection (sepsis) is not likely. Local bacterial infection is possible. (NOTE)         ICU PCT Algorithm               Non ICU PCT Algorithm    ----------------------------     ------------------------------          PCT < 0.25 ng/mL                 PCT < 0.1 ng/mL     Stopping of antibiotics            Stopping of antibiotics       strongly encouraged.               strongly encouraged.    ----------------------------     ------------------------------       PCT level decrease by               PCT < 0.25 ng/mL       >= 80% from peak PCT       OR PCT 0.25 - 0.5 ng/mL          Stopping of antibiotics                                             encouraged.     Stopping of antibiotics           encouraged.    ----------------------------     ------------------------------       PCT level decrease by              PCT >= 0.25 ng/mL       < 80% from peak PCT        AND PCT >= 0.5 ng/mL            Continuin g antibiotics                                              encouraged.       Continuing antibiotics  encouraged.    ----------------------------     ------------------------------     PCT level increase compared          PCT > 0.5 ng/mL         with peak PCT AND          PCT >= 0.5 ng/mL             Escalation of antibiotics                                          strongly encouraged.      Escalation of antibiotics        strongly encouraged.   Basic metabolic panel     Status: Abnormal   Collection Time: 08/07/17 12:08 AM  Result Value Ref Range   Sodium 140 135 - 145 mmol/L   Potassium 4.8 3.5 - 5.1 mmol/L   Chloride 104 101 - 111 mmol/L   CO2 28 22 - 32 mmol/L   Glucose, Bld 227 (H) 65 - 99 mg/dL   BUN 13 6 - 20 mg/dL   Creatinine, Ser 1.30 (H) 0.61 - 1.24 mg/dL   Calcium 8.8 (L) 8.9 - 10.3 mg/dL   GFR calc non Af Amer 57 (L) >60 mL/min   GFR calc Af Amer >60 >60 mL/min    Comment: (NOTE) The eGFR has been calculated using the CKD EPI equation. This calculation has not been validated in all clinical situations. eGFR's persistently <60 mL/min signify possible Chronic Kidney Disease.    Anion gap 8 5 - 15  CBC     Status: Abnormal   Collection Time: 08/07/17 12:08 AM   Result Value Ref Range   WBC 4.4 3.8 - 10.6 K/uL   RBC 4.05 (L) 4.40 - 5.90 MIL/uL   Hemoglobin 13.2 13.0 - 18.0 g/dL   HCT 39.3 (L) 40.0 - 52.0 %   MCV 97.1 80.0 - 100.0 fL   MCH 32.5 26.0 - 34.0 pg   MCHC 33.5 32.0 - 36.0 g/dL   RDW 14.7 (H) 11.5 - 14.5 %   Platelets 192 150 - 440 K/uL  ANA w/Reflex if Positive     Status: None   Collection Time: 08/07/17 12:08 AM  Result Value Ref Range   Anit Nuclear Antibody(ANA) Negative Negative    Comment: (NOTE) Performed At: [facility] [address] [place], [place] [id number] Rush Farmer MD MO:[id number]   Rheumatoid factor     Status: None   Collection Time: 08/07/17 12:08 AM  Result Value Ref Range   Rhuematoid fact SerPl-aCnc 11.9 0.0 - 13.9 IU/mL    Comment: (NOTE) Performed At: [facility] [place], [place] [id number] Rush Farmer MD FX:[id number]   Sedimentation rate     Status: None   Collection Time: 08/07/17 12:08 AM  Result Value Ref Range   Sed Rate 12 0 - 20 mm/hr  HIV antibody     Status: None   Collection Time: 08/07/17 12:08 AM  Result Value Ref Range   HIV Screen 4th Generation wRfx Non Reactive Non Reactive    Comment: (NOTE) Performed At: [facility] [address] [place], [place] [id number] Rush Farmer MD MH:[id number]   Lactic acid, plasma     Status: None   Collection Time: 08/07/17 12:08 AM  Result Value Ref Range   Lactic Acid, Venous 1.7 0.5 - 1.9 mmol/L  Procalcitonin     Status: None   Collection Time: 08/08/17  4:17 AM  Result Value Ref Range   Procalcitonin <0.10 ng/mL    Comment:        Interpretation: PCT (Procalcitonin) <= 0.5 ng/mL: Systemic infection (sepsis) is not likely. Local bacterial infection is possible. (NOTE)         ICU PCT Algorithm               Non ICU PCT Algorithm    ----------------------------     ------------------------------         PCT < 0.25 ng/mL                 PCT < 0.1 ng/mL     Stopping of antibiotics             Stopping of antibiotics       strongly encouraged.               strongly encouraged.    ----------------------------     ------------------------------       PCT level decrease by               PCT < 0.25 ng/mL       >= 80% from peak PCT       OR PCT 0.25 - 0.5 ng/mL          Stopping of antibiotics                                             encouraged.     Stopping of antibiotics           encouraged.    ----------------------------     ------------------------------       PCT level decrease by              PCT >= 0.25 ng/mL       < 80% from peak PCT        AND PCT >= 0.5 ng/mL            Continuin g antibiotics                                              encouraged.       Continuing antibiotics            encouraged.    ----------------------------     ------------------------------     PCT level increase compared          PCT > 0.5 ng/mL         with peak PCT AND          PCT >= 0.5 ng/mL             Escalation of antibiotics                                          strongly encouraged.      Escalation of antibiotics        strongly encouraged.   Basic metabolic panel     Status: Abnormal   Collection Time: 08/11/17  8:32 AM  Result Value Ref Range  Sodium 139 135 - 145 mmol/L   Potassium 4.6 3.5 - 5.1 mmol/L   Chloride 100 (L) 101 - 111 mmol/L   CO2 31 22 - 32 mmol/L   Glucose, Bld 120 (H) 65 - 99 mg/dL   BUN 31 (H) 6 - 20 mg/dL   Creatinine, Ser 1.07 0.61 - 1.24 mg/dL   Calcium 9.2 8.9 - 10.3 mg/dL   GFR calc non Af Amer >60 >60 mL/min   GFR calc Af Amer >60 >60 mL/min    Comment: (NOTE) The eGFR has been calculated using the CKD EPI equation. This calculation has not been validated in all clinical situations. eGFR's persistently <60 mL/min signify possible Chronic Kidney Disease.    Anion gap 8 5 - 15  Creatinine, serum     Status: None   Collection Time: 08/13/17  5:18 AM  Result Value Ref Range   Creatinine, Ser 1.07 0.61 - 1.24 mg/dL   GFR calc  non Af Amer >60 >60 mL/min   GFR calc Af Amer >60 >60 mL/min    Comment: (NOTE) The eGFR has been calculated using the CKD EPI equation. This calculation has not been validated in all clinical situations. eGFR's persistently <60 mL/min signify possible Chronic Kidney Disease.     Radiology: Dg Chest 2 View  Result Date: 08/06/2017 CLINICAL DATA:  Shortness of breath.  Sepsis. EXAM: CHEST  2 VIEW COMPARISON:  06/28/2017 and 01/22/2017 FINDINGS: Heart size is normal. The patient has severe chronic obstructive lung disease. There is crowding and prominence of the vessels of the lung bases as well some scarring at the lung bases. No consolidative infiltrates or effusions. No acute bone abnormality. IMPRESSION: No acute abnormality.  Severe emphysema. Electronically Signed   By: Lorriane Shire M.D.   On: 08/06/2017 17:17    No results found.  Dg Chest 2 View  Result Date: 08/06/2017 CLINICAL DATA:  Shortness of breath.  Sepsis. EXAM: CHEST  2 VIEW COMPARISON:  06/28/2017 and 01/22/2017 FINDINGS: Heart size is normal. The patient has severe chronic obstructive lung disease. There is crowding and prominence of the vessels of the lung bases as well some scarring at the lung bases. No consolidative infiltrates or effusions. No acute bone abnormality. IMPRESSION: No acute abnormality.  Severe emphysema. Electronically Signed   By: Lorriane Shire M.D.   On: 08/06/2017 17:17      Assessment and Plan: Patient Active Problem List   Diagnosis Date Noted  . Acute on chronic respiratory failure ([place]) 06/28/2017  . Anxiety 06/22/2016  . Essential hypertension 06/22/2016  . Acute respiratory failure with hypoxia ([place]) 03/25/2016  . Tobacco use disorder 03/25/2016  . Acute on chronic respiratory failure with hypercapnia ([place])   . Endotracheally intubated   . Chest pain with low risk of acute coronary syndrome 11/30/2015  . Chronic diastolic heart failure ([place]) 11/30/2015  . Sinus tachycardia seen  on cardiac monitor 11/30/2015  . Hypercapnic respiratory failure ([place]) 11/30/2015  . Pulmonary hypertension ([place])   . Centrilobular emphysema ([place])   . Lethargy   . Personal history of tobacco use, presenting hazards to health 08/17/2015  . Pressure ulcer 04/19/2015  . Acute on chronic diastolic CHF (congestive heart failure) ([place])   . COPD exacerbation ([place]) 03/31/2015    1. Chronic Respiratory failure with hypoxia Doing well with oxygen therapy will comntinue with 2lpm flow rate Titrate as needed  2. COPD He is doing well with his current inhalers Will continue with current meds  3. Pulmonary  HTN Again related to COPD hypoxia Continue with oxygen therapy  4. Chronic Diastolic heart failure Stable at this time will continue with diuretics Follow with cardiology as needed   General Counseling: I have discussed the findings of the evaluation and examination with Simona Huh.  I have also discussed any further diagnostic evaluation thatmay be needed or ordered today. Erving verbalizes understanding of the findings of todays visit. We also reviewed his medications today and discussed drug interactions and side effects including but not limited excessive drowsiness and altered mental states. We also discussed that there is always a risk not just to him but also people around him. he has been encouraged to call the office with any questions or concerns that should arise related to todays visit.    Time spent: 43mn  I have personally obtained a history, examined the patient, evaluated laboratory and imaging results, formulated the assessment and plan and placed orders.    SAllyne Gee MD [facility] Sleep medicine

## 2017-08-31 ENCOUNTER — Ambulatory Visit: Payer: Self-pay | Admitting: *Deleted

## 2017-08-31 ENCOUNTER — Encounter: Payer: Self-pay | Admitting: *Deleted

## 2017-08-31 NOTE — Telephone Encounter (Signed)
This encounter was created in error - please disregard.  This encounter was created in error - please disregard.

## 2017-09-04 ENCOUNTER — Other Ambulatory Visit: Payer: Self-pay

## 2017-09-04 ENCOUNTER — Ambulatory Visit: Payer: Self-pay | Admitting: Internal Medicine

## 2017-09-04 NOTE — Patient Outreach (Signed)
[name]) Care Management  09/04/2017  Dustin CoriaDennis R [name] Jan 25, 1956 [id number]   Telephone call to patient for [facility].  Patient able to verify HIPAA.  Discussed with patient recent discharge from SNF.  Patient states he is doing well and that he is with his daughter. Asked patient where he was located.  He sates [place] and feels he did not need out services right now as he is with his daughter until the holidays are over.  Advised patient when he returns home he can call us if needed.  He verbalized understanding.    Plan: RN CM will close case and notify social worker Dustin [name] to close case out as well.  RN CM will notify care management assistant of case status.    Dustin Lericheionne J Glenda Spelman, RN, MSN [facility] Care Management Care Management Coordinator Direct Line [phone] Toll Free: [phone]  Fax: [phone]

## 2017-09-05 ENCOUNTER — Other Ambulatory Visit: Payer: Self-pay | Admitting: *Deleted

## 2017-09-05 ENCOUNTER — Encounter: Payer: Self-pay | Admitting: *Deleted

## 2017-09-05 NOTE — Patient Outreach (Signed)
[name]) Care Management  09/05/2017  Dustin CoriaDennis R Kopp May 14, 1956 [id number]   Phone call from Telephonic RNCM stating that she has contacted patient and was ale to confirm that patient isdoing well and [place]. Per patient, he does not need [facility] care management assistance at this time. This Child psychotherapistsocial worker to inform patient's provider of case closure.   Adriana ReamsChrystal Mikinzie Maciejewski, [name] [facility] Care Management [phone]

## 2017-09-07 NOTE — Telephone Encounter (Signed)
This encounter was created in error - please disregard.

## 2017-09-16 ENCOUNTER — Other Ambulatory Visit: Payer: Self-pay | Admitting: Cardiovascular Disease

## 2017-09-16 ENCOUNTER — Other Ambulatory Visit: Payer: Self-pay | Admitting: Internal Medicine

## 2017-09-17 NOTE — Telephone Encounter (Signed)
Medication not currently on patient's medication list. Please advise if refill is needed.

## 2017-09-18 ENCOUNTER — Other Ambulatory Visit: Payer: Self-pay

## 2017-09-18 ENCOUNTER — Inpatient Hospital Stay
Admission: EM | Admit: 2017-09-18 | Discharge: 2017-09-24 | DRG: 871 | Disposition: A | Discharge status: Home / Self Care | Payer: Medicare Other | Attending: Internal Medicine | Admitting: Internal Medicine

## 2017-09-18 ENCOUNTER — Encounter: Payer: Self-pay | Admitting: Emergency Medicine

## 2017-09-18 ENCOUNTER — Emergency Department: Payer: Medicare Other

## 2017-09-18 DIAGNOSIS — I110 Hypertensive heart disease with heart failure: Secondary | ICD-10-CM | POA: Diagnosis not present

## 2017-09-18 DIAGNOSIS — J9621 Acute and chronic respiratory failure with hypoxia: Secondary | ICD-10-CM | POA: Diagnosis present

## 2017-09-18 DIAGNOSIS — R000 Tachycardia, unspecified: Secondary | ICD-10-CM | POA: Diagnosis present

## 2017-09-18 DIAGNOSIS — I5032 Chronic diastolic (congestive) heart failure: Secondary | ICD-10-CM | POA: Diagnosis not present

## 2017-09-18 DIAGNOSIS — R531 Weakness: Secondary | ICD-10-CM | POA: Diagnosis not present

## 2017-09-18 DIAGNOSIS — E860 Dehydration: Secondary | ICD-10-CM | POA: Diagnosis not present

## 2017-09-18 DIAGNOSIS — Z87891 Personal history of nicotine dependence: Secondary | ICD-10-CM | POA: Diagnosis not present

## 2017-09-18 DIAGNOSIS — J189 Pneumonia, unspecified organism: Secondary | ICD-10-CM | POA: Diagnosis not present

## 2017-09-18 DIAGNOSIS — J449 Chronic obstructive pulmonary disease, unspecified: Secondary | ICD-10-CM | POA: Diagnosis not present

## 2017-09-18 DIAGNOSIS — J440 Chronic obstructive pulmonary disease with acute lower respiratory infection: Secondary | ICD-10-CM | POA: Diagnosis not present

## 2017-09-18 DIAGNOSIS — Z7951 Long term (current) use of inhaled steroids: Secondary | ICD-10-CM | POA: Diagnosis not present

## 2017-09-18 DIAGNOSIS — K219 Gastro-esophageal reflux disease without esophagitis: Secondary | ICD-10-CM | POA: Diagnosis not present

## 2017-09-18 DIAGNOSIS — R0602 Shortness of breath: Secondary | ICD-10-CM | POA: Diagnosis not present

## 2017-09-18 DIAGNOSIS — R0902 Hypoxemia: Secondary | ICD-10-CM

## 2017-09-18 DIAGNOSIS — J9601 Acute respiratory failure with hypoxia: Secondary | ICD-10-CM | POA: Diagnosis not present

## 2017-09-18 DIAGNOSIS — Z9981 Dependence on supplemental oxygen: Secondary | ICD-10-CM

## 2017-09-18 DIAGNOSIS — Y95 Nosocomial condition: Secondary | ICD-10-CM | POA: Diagnosis present

## 2017-09-18 DIAGNOSIS — Z79899 Other long term (current) drug therapy: Secondary | ICD-10-CM | POA: Diagnosis not present

## 2017-09-18 DIAGNOSIS — A419 Sepsis, unspecified organism: Principal | ICD-10-CM | POA: Diagnosis present

## 2017-09-18 DIAGNOSIS — J441 Chronic obstructive pulmonary disease with (acute) exacerbation: Secondary | ICD-10-CM

## 2017-09-18 LAB — BASIC METABOLIC PANEL
ANION GAP: 11 (ref 5–15)
BUN: 11 mg/dL (ref 6–20)
CO2: 32 mmol/L (ref 22–32)
Calcium: 9.5 mg/dL (ref 8.9–10.3)
Chloride: 101 mmol/L (ref 101–111)
Creatinine, Ser: 1.08 mg/dL (ref 0.61–1.24)
Glucose, Bld: 126 mg/dL — ABNORMAL HIGH (ref 65–99)
Potassium: 4.3 mmol/L (ref 3.5–5.1)
SODIUM: 144 mmol/L (ref 135–145)

## 2017-09-18 LAB — CBC
HEMATOCRIT: 43 % (ref 40.0–52.0)
HEMOGLOBIN: 13.9 g/dL (ref 13.0–18.0)
MCH: 32 pg (ref 26.0–34.0)
MCHC: 32.3 g/dL (ref 32.0–36.0)
MCV: 99.3 fL (ref 80.0–100.0)
Platelets: 217 10*3/uL (ref 150–440)
RBC: 4.34 MIL/uL — ABNORMAL LOW (ref 4.40–5.90)
RDW: 14.5 % (ref 11.5–14.5)
WBC: 16.7 10*3/uL — AB (ref 3.8–10.6)

## 2017-09-18 LAB — TROPONIN I: Troponin I: <0.03 ng/mL (ref <0.03)

## 2017-09-18 NOTE — ED Triage Notes (Signed)
Patient to ER for c/o shortness of breath. Patient has h/o COPD. Patient recently discharged from rehab  (approx 2.5 weeks ago) d/t COPD exacerbation and PNA. Patient states he has been unable to get comfortable today d/t shortness of breath. Patient able to speak in complete sentences. Patient arrives to ER on 2L O2 via nasal cannula, patient states this is what he wears all the time.

## 2017-09-18 NOTE — ED Notes (Signed)
Pt sitting in lobby with no distress noted, accomp by family; hosp O2 tank replenished per pt's home use of 2l/min via [place]; pt updated on wait time and delay

## 2017-09-19 DIAGNOSIS — J9601 Acute respiratory failure with hypoxia: Secondary | ICD-10-CM | POA: Diagnosis present

## 2017-09-19 DIAGNOSIS — J440 Chronic obstructive pulmonary disease with acute lower respiratory infection: Secondary | ICD-10-CM | POA: Diagnosis present

## 2017-09-19 DIAGNOSIS — I5032 Chronic diastolic (congestive) heart failure: Secondary | ICD-10-CM | POA: Diagnosis present

## 2017-09-19 DIAGNOSIS — I110 Hypertensive heart disease with heart failure: Secondary | ICD-10-CM | POA: Diagnosis present

## 2017-09-19 DIAGNOSIS — J189 Pneumonia, unspecified organism: Secondary | ICD-10-CM | POA: Diagnosis present

## 2017-09-19 DIAGNOSIS — K219 Gastro-esophageal reflux disease without esophagitis: Secondary | ICD-10-CM | POA: Diagnosis present

## 2017-09-19 DIAGNOSIS — Z87891 Personal history of nicotine dependence: Secondary | ICD-10-CM | POA: Diagnosis not present

## 2017-09-19 DIAGNOSIS — Z9981 Dependence on supplemental oxygen: Secondary | ICD-10-CM | POA: Diagnosis not present

## 2017-09-19 DIAGNOSIS — Y95 Nosocomial condition: Secondary | ICD-10-CM | POA: Diagnosis present

## 2017-09-19 DIAGNOSIS — J9621 Acute and chronic respiratory failure with hypoxia: Secondary | ICD-10-CM | POA: Diagnosis present

## 2017-09-19 DIAGNOSIS — R000 Tachycardia, unspecified: Secondary | ICD-10-CM | POA: Diagnosis present

## 2017-09-19 DIAGNOSIS — J441 Chronic obstructive pulmonary disease with (acute) exacerbation: Secondary | ICD-10-CM | POA: Diagnosis present

## 2017-09-19 DIAGNOSIS — A419 Sepsis, unspecified organism: Secondary | ICD-10-CM | POA: Diagnosis present

## 2017-09-19 DIAGNOSIS — Z79899 Other long term (current) drug therapy: Secondary | ICD-10-CM | POA: Diagnosis not present

## 2017-09-19 DIAGNOSIS — Z7951 Long term (current) use of inhaled steroids: Secondary | ICD-10-CM | POA: Diagnosis not present

## 2017-09-19 DIAGNOSIS — E860 Dehydration: Secondary | ICD-10-CM | POA: Diagnosis present

## 2017-09-19 LAB — INFLUENZA PANEL BY PCR (TYPE A & B)
INFLBPCR: NEGATIVE
Influenza A By PCR: NEGATIVE

## 2017-09-19 LAB — LACTIC ACID, PLASMA
Lactic Acid, Venous: 1.1 mmol/L (ref 0.5–1.9)
Lactic Acid, Venous: 1.2 mmol/L (ref 0.5–1.9)

## 2017-09-19 LAB — EXPECTORATED SPUTUM ASSESSMENT W GRAM STAIN, RFLX TO RESP C

## 2017-09-19 LAB — TSH: TSH: 1.179 u[IU]/mL (ref 0.350–4.500)

## 2017-09-19 LAB — GLUCOSE, CAPILLARY: Glucose-Capillary: 111 mg/dL — ABNORMAL HIGH (ref 65–99)

## 2017-09-19 LAB — HEMOGLOBIN A1C
Hgb A1c MFr Bld: 5.4 % (ref 4.8–5.6)
Mean Plasma Glucose: 108.28 mg/dL

## 2017-09-19 LAB — EXPECTORATED SPUTUM ASSESSMENT W REFEX TO RESP CULTURE

## 2017-09-19 LAB — MRSA PCR SCREENING: MRSA by PCR: NEGATIVE

## 2017-09-19 MED ORDER — THEOPHYLLINE ER 400 MG PO CP24: 400.0000 mg | ORAL_CAPSULE | Freq: Every day | ORAL | Status: DC

## 2017-09-19 MED ORDER — ONDANSETRON HCL 4 MG/2ML IJ SOLN: 4.0000 mg | Freq: Four times a day (QID) | INTRAMUSCULAR | Status: DC | PRN

## 2017-09-19 MED ORDER — IPRATROPIUM-ALBUTEROL 0.5-2.5 (3) MG/3ML IN SOLN
RESPIRATORY_TRACT | Status: AC
  Administered 2017-09-19: 9 mL
  Filled 2017-09-19: qty 9

## 2017-09-19 MED ORDER — THEOPHYLLINE ER 100 MG PO CP24
400.0000 mg | ORAL_CAPSULE | Freq: Every day | ORAL | Status: DC
  Administered 2017-09-19 – 2017-09-24 (×6): 400 mg via ORAL
  Filled 2017-09-19 (×6): qty 4

## 2017-09-19 MED ORDER — VANCOMYCIN HCL IN DEXTROSE 1-5 GM/200ML-% IV SOLN
1000.0000 mg | Freq: Once | INTRAVENOUS | Status: AC
  Administered 2017-09-19: 1000 mg via INTRAVENOUS
  Filled 2017-09-19: qty 200

## 2017-09-19 MED ORDER — ACETAMINOPHEN 650 MG RE SUPP: 650.0000 mg | Freq: Four times a day (QID) | RECTAL | Status: DC | PRN

## 2017-09-19 MED ORDER — ALBUTEROL SULFATE (2.5 MG/3ML) 0.083% IN NEBU
2.5000 mg | INHALATION_SOLUTION | Freq: Once | RESPIRATORY_TRACT | Status: AC
  Administered 2017-09-19: 2.5 mg via RESPIRATORY_TRACT
  Filled 2017-09-19: qty 3

## 2017-09-19 MED ORDER — POTASSIUM CHLORIDE IN NACL 20-0.9 MEQ/L-% IV SOLN
INTRAVENOUS | Status: DC
  Administered 2017-09-19: 06:00:00 via INTRAVENOUS
  Filled 2017-09-19 (×3): qty 1000

## 2017-09-19 MED ORDER — ONDANSETRON HCL 4 MG PO TABS: 4.0000 mg | ORAL_TABLET | Freq: Four times a day (QID) | ORAL | Status: DC | PRN

## 2017-09-19 MED ORDER — THEOPHYLLINE ER 400 MG PO TB24
400.0000 mg | ORAL_TABLET | Freq: Every day | ORAL | Status: DC
  Filled 2017-09-19: qty 1

## 2017-09-19 MED ORDER — DEXTROSE 5 % IV SOLN
500.0000 mg | INTRAVENOUS | Status: DC
  Administered 2017-09-19: 500 mg via INTRAVENOUS
  Filled 2017-09-19: qty 500

## 2017-09-19 MED ORDER — VANCOMYCIN HCL 10 G IV SOLR
1250.0000 mg | Freq: Two times a day (BID) | INTRAVENOUS | Status: DC
  Administered 2017-09-19: 1250 mg via INTRAVENOUS
  Filled 2017-09-19 (×3): qty 1250

## 2017-09-19 MED ORDER — DEXTROSE 5 % IV SOLN
1.0000 g | Freq: Three times a day (TID) | INTRAVENOUS | Status: DC
  Administered 2017-09-19 – 2017-09-21 (×6): 1 g via INTRAVENOUS
  Filled 2017-09-19 (×11): qty 1

## 2017-09-19 MED ORDER — DEXTROSE 5 % IV SOLN
1.0000 g | Freq: Once | INTRAVENOUS | Status: AC
  Administered 2017-09-19: 1 g via INTRAVENOUS
  Filled 2017-09-19: qty 1

## 2017-09-19 MED ORDER — SODIUM CHLORIDE 0.9 % IV BOLUS (SEPSIS)
1000.0000 mL | Freq: Once | INTRAVENOUS | Status: AC
  Administered 2017-09-19: 1000 mL via INTRAVENOUS

## 2017-09-19 MED ORDER — DEXTROSE 5 % IV SOLN
2.0000 g | Freq: Three times a day (TID) | INTRAVENOUS | Status: DC
  Administered 2017-09-19: 2 g via INTRAVENOUS
  Filled 2017-09-19 (×3): qty 2

## 2017-09-19 MED ORDER — TIOTROPIUM BROMIDE MONOHYDRATE 18 MCG IN CAPS
18.0000 ug | ORAL_CAPSULE | Freq: Every day | RESPIRATORY_TRACT | Status: DC
  Administered 2017-09-19 – 2017-09-24 (×6): 18 ug via RESPIRATORY_TRACT
  Filled 2017-09-19 (×2): qty 5

## 2017-09-19 MED ORDER — ACETAMINOPHEN 325 MG PO TABS
650.0000 mg | ORAL_TABLET | Freq: Four times a day (QID) | ORAL | Status: DC | PRN
  Administered 2017-09-21: 12:00:00 650 mg via ORAL
  Filled 2017-09-19: qty 2

## 2017-09-19 MED ORDER — CHLORHEXIDINE GLUCONATE 0.12 % MT SOLN
15.0000 mL | Freq: Two times a day (BID) | OROMUCOSAL | Status: DC
  Administered 2017-09-19 – 2017-09-23 (×10): 15 mL via OROMUCOSAL
  Filled 2017-09-19 (×9): qty 15

## 2017-09-19 MED ORDER — ALBUTEROL SULFATE (2.5 MG/3ML) 0.083% IN NEBU
2.5000 mg | INHALATION_SOLUTION | Freq: Four times a day (QID) | RESPIRATORY_TRACT | Status: DC
  Administered 2017-09-20 – 2017-09-22 (×9): 2.5 mg via RESPIRATORY_TRACT
  Filled 2017-09-19 (×9): qty 3

## 2017-09-19 MED ORDER — ENOXAPARIN SODIUM 40 MG/0.4ML ~~LOC~~ SOLN
40.0000 mg | SUBCUTANEOUS | Status: DC
  Administered 2017-09-19 – 2017-09-24 (×6): 40 mg via SUBCUTANEOUS
  Filled 2017-09-19 (×6): qty 0.4

## 2017-09-19 MED ORDER — FUROSEMIDE 10 MG/ML IJ SOLN
40.0000 mg | Freq: Every day | INTRAMUSCULAR | Status: DC
  Administered 2017-09-19 – 2017-09-21 (×3): 40 mg via INTRAVENOUS
  Filled 2017-09-19 (×3): qty 4

## 2017-09-19 MED ORDER — MOMETASONE FURO-FORMOTEROL FUM 200-5 MCG/ACT IN AERO
1.0000 | INHALATION_SPRAY | Freq: Two times a day (BID) | RESPIRATORY_TRACT | Status: DC
  Administered 2017-09-19 – 2017-09-24 (×11): 1 via RESPIRATORY_TRACT
  Filled 2017-09-19 (×3): qty 8.8

## 2017-09-19 MED ORDER — METHYLPREDNISOLONE SODIUM SUCC 40 MG IJ SOLR
40.0000 mg | Freq: Two times a day (BID) | INTRAMUSCULAR | Status: DC
  Administered 2017-09-19 – 2017-09-20 (×3): 40 mg via INTRAVENOUS
  Filled 2017-09-19 (×3): qty 1

## 2017-09-19 MED ORDER — ALPRAZOLAM 0.5 MG PO TABS
0.5000 mg | ORAL_TABLET | Freq: Three times a day (TID) | ORAL | Status: DC
  Administered 2017-09-19 – 2017-09-23 (×14): 0.5 mg via ORAL
  Filled 2017-09-19 (×16): qty 1

## 2017-09-19 MED ORDER — ORAL CARE MOUTH RINSE
15.0000 mL | Freq: Two times a day (BID) | OROMUCOSAL | Status: DC
  Administered 2017-09-20 – 2017-09-23 (×8): 15 mL via OROMUCOSAL

## 2017-09-19 MED ORDER — AZITHROMYCIN 500 MG PO TABS
500.0000 mg | ORAL_TABLET | Freq: Every day | ORAL | Status: DC
  Filled 2017-09-19: qty 1

## 2017-09-19 MED ORDER — ALBUTEROL SULFATE (2.5 MG/3ML) 0.083% IN NEBU
2.5000 mg | INHALATION_SOLUTION | RESPIRATORY_TRACT | Status: DC
  Administered 2017-09-19 (×5): 2.5 mg via RESPIRATORY_TRACT
  Filled 2017-09-19 (×6): qty 3

## 2017-09-19 MED ORDER — AZITHROMYCIN 500 MG PO TABS: 500.0000 mg | ORAL_TABLET | Freq: Every day | ORAL | Status: DC

## 2017-09-19 MED ORDER — DOCUSATE SODIUM 100 MG PO CAPS
100.0000 mg | ORAL_CAPSULE | Freq: Two times a day (BID) | ORAL | Status: DC
  Administered 2017-09-19 – 2017-09-21 (×5): 100 mg via ORAL
  Filled 2017-09-19 (×5): qty 1

## 2017-09-19 MED ORDER — LORAZEPAM 2 MG/ML IJ SOLN
1.0000 mg | Freq: Three times a day (TID) | INTRAMUSCULAR | Status: DC | PRN
  Administered 2017-09-20 – 2017-09-23 (×4): 1 mg via INTRAVENOUS
  Filled 2017-09-19 (×4): qty 1

## 2017-09-19 NOTE — ED Notes (Signed)
Per Dr Zenda AlpersWebster, start antibiotics and do not wait for blood cultures.

## 2017-09-19 NOTE — Progress Notes (Signed)
   CHIEF COMPLAINT:   Chief Complaint  Patient presents with  . Shortness of Breath    Subjective  62 yo WM with past medical history of diastolic CHF, COPD and hypertension presents to the emergency department complaining of shortness of breath.  The patient was recently discharged from the hospital for COPD exacerbation.  He has completed rehab but has felt more short of breath today.  He denies chest pain.  Chest x-ray in the emergency department revealed pneumonia.  The patient was placed on BiPAP due to increased work of breathing   Patient seems to be improving with his resp status Will attempt to wean off biPAP       Objective   Examination:  General exam: Appears calm and comfortable on biPAP Respiratory system: Clear to auscultation. Respiratory effort normal. HEENT: [place]/AT, PERRLA, no thrush, no stridor. Cardiovascular system: S1 & S2 heard, RRR. No JVD, murmurs, rubs, gallops or clicks. No pedal edema. Gastrointestinal system: Abdomen is nondistended, soft and nontender. No organomegaly or masses felt. Normal bowel sounds heard. Central nervous system: Alert and oriented. No focal neurological deficits. Extremities: Symmetric 5 x 5 power. Skin: No rashes, lesions or ulcers Psychiatry: Judgement and insight appear normal. Mood & affect appropriate.   VITALS:  height is 5\' 11"  (1.803 m) and weight is 219 lb 9.3 oz (99.6 kg). His oral temperature is 99.9 F (37.7 C). His blood pressure is 120/74 and his pulse is 121 (abnormal). His respiration is 27 (abnormal) and oxygen saturation is 94%.   I personally reviewed Labs under Results section.     Assessment/Plan 62 year old male admitted for acute respiratory failure from COPD exacerbation, CHF exacerbation with HCAP  1.  Respiratory failure acute with hypoxia.  Continue BiPAP for respiratory support.  Wean O2 as tolerated. Wean off as toelrated 2.  Sepsis: The patient meets criteria via tachycardia, tachypnea and  leukocytosis.  He is hemodynamically stable.  Continue broad-spectrum antibiotics and aggressive IV fluid.  Follow blood cultures for growth and sensitivities.  Collect sputum if possible. 3.  Pneumonia: HCAP; cefepime. Add azithromycin 4.  COPD: Continue inhaled corticosteroids and Spiriva.  Albuterol and theophylline scheduled.  5.  DVT prophylaxis: Lovenox 6.  GI prophylaxis: None    Disposition Plan: ok to transfer to gen med floor later this afternoon if remains off of biPAP     Lucie LeatherKurian David Madailein Londo, M.D.  [facility]  Medical Director [facility] Medical Director [facility] Cardio-Pulmonary Department

## 2017-09-19 NOTE — Consult Note (Signed)
   [facility] CM [facility] Consult   09/19/2017  Dustin CoriaDennis R [name] [birth year] [id number]   Referral received by inpatient case manager  for [name] Care Management services and post hospital discharge follow up related to a diagnosis of HF, COPD and 3 admits in 6 months . Patient was evaluated for community based chronic disease management services with [facility] as a benefit of patient's [employer]  Medicare. Called into patient's room  to explain [facility] Care Management services. Patient stated he has participated in [facility] CM services previously and stated it was very helpful.  Patient states his discharge plan is to go home with his son, who [place] in [place]. He stated he felt this was a temporary arrangement and needed assistance with finding affordable housing in [place] or [place]. He also stated he was having trouble affording his medications. Verbal consent recieved. Patient gave [phone] as the best number to reach him. Patient will receive post hospital discharge calls and be evaluated for monthly home visits. [facility] Care Management services does not interfere with or replace any services arranged by the inpatient care management team. RNCM left contact information and [facility] literature at the bedside. Made inpatient RNCM aware that [facility] will be following for care management. For additional questions please contact:   Scarlet Abad RN, BSN [facility]  Hospital Liaison  [phone]) Business Mobile [phone]) Toll free office

## 2017-09-19 NOTE — ED Notes (Signed)
Unable to get blood cultures x 4 attempts. Lab called.

## 2017-09-19 NOTE — Telephone Encounter (Signed)
Looks like medication was discontinued on 08/06/17 by Dr Hilton SinclairWeiting when patient was in the hospital for COPD exacerbation.  Will route to Dr Mariah MillingGollan to verify if ok to refill as this is a prn medication.

## 2017-09-19 NOTE — ED Notes (Signed)
Called lab to ask about ETA. Should be 20 minutes.

## 2017-09-19 NOTE — Progress Notes (Signed)
Pharmacy Antibiotic Note  Dustin [name] is a 62 y.o. male admitted on 09/18/2017 with pneumonia.  Pharmacy has been consulted for cefepime dosing. Patient recently in rehab facility and will be covered for possible HCAP.   Plan: Per ICU rounds, continue cefepime 1g IV Q8hr for total of 8 days of therapy for possible HCAP.   Per ICU rounds, will discontinue vancomycin secondary to negative MRSA PCR.   Per ICU rounds, will continue azithromycin 500mg  PO Q24hr for total of 5 days of atypical coverage.   Height: 5\' 11"  (180.3 cm) Weight: 219 lb 9.3 oz (99.6 kg) IBW/kg (Calculated) : 75.3  Temp (24hrs), Avg:99.1 F (37.3 C), Min:98.3 F (36.8 C), Max:99.9 F (37.7 C)  Recent Labs  Lab 09/18/17 2046 09/19/17 0203 09/19/17 0547  WBC 16.7*  --   --   CREATININE 1.08  --   --   LATICACIDVEN  --  1.1 1.2    Estimated Creatinine Clearance: 84.2 mL/min (by C-G formula based on SCr of 1.08 mg/dL).    Allergies  Allergen Reactions  . Asa [Aspirin] Other (See Comments)    Reaction: swelling of the right side.  . Bee Venom Swelling  . Codeine Other (See Comments)    Reaction: Difficulty breathing  . Ibuprofen Other (See Comments)    Reaction: Swelling of the right side.    Antimicrobials this admission: Vancomycin 1/2 >> 1/2 Cefepime 1/2 >>  Azithromycin 1/2 >>   Dose adjustments this admission: 1/2 Cefepime transitioned to 1g IV Q8hr; azithromycin transitioned to Oral   Microbiology results: 1/2 BCx: no growth < 12 hours  1/2 Sputum: pending  1/2 MRSA PCR: negative   Thank you for allowing pharmacy to be a part of this patient's care.  [name],[name] 09/19/2017 2:38 PM

## 2017-09-19 NOTE — ED Provider Notes (Signed)
[facility] Emergency Department Provider Note   ____________________________________________   First MD Initiated Contact with Patient 09/19/17 0003     (approximate)  I have reviewed the triage vital signs and the nursing notes.   HISTORY  Chief Complaint Shortness of Breath    [name] is a 62 y.o. male who comes into the hospital today with shortness of breath.  The patient states that he woke up this morning and he was feeling short of breath.  It got worse and worse throughout the day.  The patient is also been coughing up some yellowish green sputum.  He reports that he had a temperature of 98.5 and he reports that that is a fever for him as his temperature is normally lower.  The patient also has been out of town so he has not had his breathing treatments for the past 2 weeks.  He reports that it was okay but he had nothing to take today.  The patient does have a history of COPD and normally is on 2 L of O2 at home.  The patient denies any sick contacts but he has had some chest tightness.  The patient came into the hospital today for evaluation of his symptoms.  He has been admitted to the hospital with COPD exacerbations both in November and October.  Past Medical History:  Diagnosis Date  . Allergy   . Anxiety   . Anxiety   . Asthma   . Chronic diastolic CHF (congestive heart failure) (HCC)    a. echo 07/2013: EF 60-65%, DD, biatrial dilatation, Ao sclerosis, dilated RV, moderate pulmonary HTN, elevated CV and RA pressures; b. patient reported echo at Dr. Milta DeitersKhan's office 02/2015 - his office does not have record of him being a pt there c. echo 11/2015: EF 60-65%, Grade 1 DD, mod-severe pulm pressures  . Chronic respiratory failure (HCC)    a. on 2L via nasal cannula; b. secondary to COPD  . COPD (chronic obstructive pulmonary disease) (HCC)   . Depression   . Depression   . Depression   . Emphysema of lung (HCC)   . GERD  (gastroesophageal reflux disease)   . Hypertension   . Personal history of tobacco use, presenting hazards to health 08/17/2015  . Tobacco abuse     Patient Active Problem List   Diagnosis Date Noted  . Chronic respiratory failure with hypoxia (HCC) 08/30/2017  . Acute on chronic respiratory failure (HCC) 06/28/2017  . Anxiety 06/22/2016  . Essential hypertension 06/22/2016  . Acute respiratory failure with hypoxia (HCC) 03/25/2016  . Tobacco use disorder 03/25/2016  . Acute on chronic respiratory failure with hypercapnia (HCC)   . Endotracheally intubated   . Chest pain with low risk of acute coronary syndrome 11/30/2015  . Chronic diastolic heart failure (HCC) 11/30/2015  . Sinus tachycardia seen on cardiac monitor 11/30/2015  . Hypercapnic respiratory failure (HCC) 11/30/2015  . Pulmonary hypertension (HCC)   . Centrilobular emphysema (HCC)   . Lethargy   . Personal history of tobacco use, presenting hazards to health 08/17/2015  . Pressure ulcer 04/19/2015  . Acute on chronic diastolic CHF (congestive heart failure) (HCC)   . COPD exacerbation (HCC) 03/31/2015    Past Surgical History:  Procedure Laterality Date  . ABDOMINAL SURGERY    . ADENOIDECTOMY    . CARPAL TUNNEL RELEASE Right   . corpal tunnel Right   . ELBOW SURGERY Right    repaired tendon  . hemmorhoid  N/A   . hemorrh    . NASAL SINUS SURGERY    . NOSE SURGERY    . reflux surgery    . ROTATOR CUFF REPAIR Right   . SHOULDER SURGERY    . TENNIS ELBOW RELEASE/NIRSCHEL PROCEDURE Right   . URETHRA SURGERY     surgery 6 times from age [age] yrs old  . URETHRA SURGERY      Prior to Admission medications   Medication Sig Start Date End Date Taking? Authorizing Provider  Albuterol Sulfate (PROAIR RESPICLICK) 108 (90 BASE) MCG/ACT AEPB Inhale 2 puffs into the lungs every 4 (four) hours as needed (for shortness of breath). Reported on 12/10/2015   Yes [provider]  ALPRAZolam Prudy Feeler) 1 MG tablet Take  1 tablet (1 mg total) by mouth 3 (three) times daily as needed for anxiety. 08/14/17  Yes Gouru, Deanna Artis, MD  Cholecalciferol (VITAMIN D) 2000 units CAPS Take 2,000 Units by mouth daily.   Yes [provider]  escitalopram (LEXAPRO) 20 MG tablet Take 20 mg by mouth at bedtime.    Yes [provider]  furosemide (LASIX) 20 MG tablet Take 20 mg by mouth daily.    Yes [provider]  gabapentin (NEURONTIN) 300 MG capsule Take 300 mg 3 (three) times daily by mouth.    Yes [provider]  gabapentin (NEURONTIN) 600 MG tablet TAKE 1 TABLET BY MOUTH THREE TIMES DAILY 09/17/17  Yes Lyndon Code, MD  guaiFENesin (MUCINEX) 600 MG 12 hr tablet Take 1 tablet (600 mg total) by mouth 2 (two) times daily. 07/03/17  Yes Enedina Finner, MD  ipratropium-albuterol (DUONEB) 0.5-2.5 (3) MG/3ML SOLN Take 3 mLs by nebulization 4 (four) times daily as needed (for shortness of breath).   Yes [provider]  mometasone-formoterol (DULERA) 200-5 MCG/ACT AERO Inhale 1 puff into the lungs 2 (two) times daily.    Yes [provider]  potassium chloride (K-DUR,KLOR-CON) 10 MEQ tablet Take 10 mEq by mouth daily.    Yes [provider]  Probiotic Product (PROBIOTIC-10) CAPS Take 1 capsule by mouth 2 (two) times daily.    Yes [provider]  rOPINIRole (REQUIP) 0.25 MG tablet TAKE 1 TABLET BY MOUTH TWICE DAILY FOR CRAMPS 09/17/17  Yes Lyndon Code, MD  theophylline (UNIPHYL) 400 MG 24 hr tablet Take 400 mg by mouth daily.    Yes [provider]  Tiotropium Bromide Monohydrate (SPIRIVA RESPIMAT) 2.5 MCG/ACT AERS Inhale 2 puffs into the lungs daily.    Yes [provider]    Allergies Asa [aspirin]; Bee venom; Codeine; and Ibuprofen  Family History  Problem Relation Age of Onset  . CAD Father   . Hyperlipidemia Father   . Stroke Father   . Heart disease Father   . Hypertension Mother   . Peripheral Artery Disease Mother   . Rheum  arthritis Mother   . Asthma Mother   . Bipolar disorder Mother   . Depression Mother   . Malignant hypertension Mother   . Diabetes Neg Hx     Social History Social History   Tobacco Use  . Smoking status: Former Smoker    Packs/day: 0.10    Years: 43.00    Pack years: 4.30    Types: E-cigarettes, Cigarettes  . Smokeless tobacco: Never Used  Substance Use Topics  . Alcohol use: No  . Drug use: No    Review of Systems  Constitutional: Fever Eyes: No visual changes. ENT: No sore throat.  Cardiovascular: Chest tightness Respiratory: Cough and shortness of breath. Gastrointestinal: No abdominal pain.  No nausea, no vomiting.  No diarrhea.  No constipation. Genitourinary: Negative for dysuria. Musculoskeletal: Negative for back pain. Skin: Negative for rash. Neurological: Negative for headaches, focal weakness or numbness.   ____________________________________________   PHYSICAL EXAM:  VITAL SIGNS: ED Triage Vitals  Enc Vitals Group     BP 09/18/17 2042 122/67     Pulse Rate 09/18/17 2042 (!) 121     Resp 09/18/17 2042 16     Temp 09/18/17 2042 98.5 F (36.9 C)     Temp Source 09/18/17 2042 Oral     SpO2 09/18/17 2042 93 %     Weight 09/18/17 2043 214 lb (97.1 kg)     Height 09/18/17 2043 5\' 11"  (1.803 m)     Head Circumference --      Peak Flow --      Pain Score --      Pain Loc --      Pain Edu? --      Excl. in GC? --     Constitutional: Alert and oriented. Well appearing and in moderate respiratory distress. Eyes: Conjunctivae are normal. PERRL. EOMI. Head: Atraumatic. Nose: No congestion/rhinnorhea. Mouth/Throat: Mucous membranes are moist.  Oropharynx non-erythematous. Cardiovascular: Tachycardia, regular rhythm. Grossly normal heart sounds.  Good peripheral circulation. Respiratory: Increased respiratory effort.  No retractions.  Expiratory wheezes in all lung fields Gastrointestinal: Soft and nontender. No distention.  Positive bowel  sounds Musculoskeletal: No lower extremity tenderness nor edema.   Neurologic:  Normal speech and language.  Skin:  Skin is warm, dry and intact.  Psychiatric: Mood and affect are normal.   ____________________________________________   LABS (all labs ordered are listed, but only abnormal results are displayed)  Labs Reviewed  CBC - Abnormal; Notable for the following components:      Result Value   WBC 16.7 (*)    RBC 4.34 (*)    All other components within normal limits  BASIC METABOLIC PANEL - Abnormal; Notable for the following components:   Glucose, Bld 126 (*)    All other components within normal limits  CULTURE, EXPECTORATED SPUTUM-ASSESSMENT  CULTURE, BLOOD (ROUTINE X 2)  CULTURE, BLOOD (ROUTINE X 2)  CULTURE, RESPIRATORY (NON-EXPECTORATED)  TROPONIN I  INFLUENZA PANEL BY PCR (TYPE A & B)  LACTIC ACID, PLASMA  LACTIC ACID, PLASMA   ____________________________________________  EKG  ED ECG REPORT I, Rebecka Apley, the attending physician, personally viewed and interpreted this ECG.   Date: 09/18/2017  EKG Time: 2048  Rate: 120  Rhythm: sinus tachycardia  Axis: left axis deviation  Intervals:right bundle branch block  ST&T Change: flipped T wave lead V3   ED ECG REPORT #2 I, Rebecka Apley, the attending physician, personally viewed and interpreted this ECG.   Date: 09/19/2017  EKG Time: 151  Rate: 127  Rhythm: sinus tachycardia  Axis: normal  Intervals:right bundle branch block and left anterior fascicular block  ST&T Change: Flipped T wave in lead V1 and V3  ____________________________________________  RADIOLOGY  Dg Chest 2 View  Result Date: 09/18/2017 CLINICAL DATA:  Shortness of breath.  COPD. EXAM: CHEST  2 VIEW COMPARISON:  Two-view chest x-ray 08/06/2017. FINDINGS: Changes of COPD are again noted. The heart size is normal. Minimal lower lobe airspace disease or scarring is stable. Superimposed right middle lobe airspace disease  is new. IMPRESSION: 1. New right middle lobe airspace disease is concerning for pneumonia.  2. COPD. 3. Stable lower lobe airspace disease, likely atelectasis or scarring. Electronically Signed   By: Marin Roberts M.D.   On: 09/18/2017 21:04    ____________________________________________   PROCEDURES  Procedure(s) performed: please, see procedure note(s).  .Critical Care Performed by: Rebecka Apley, MD Authorized by: Rebecka Apley, MD   Critical care provider statement:    Critical care time (minutes):  30   Critical care start time:  09/19/2017 1:40 AM   Critical care end time:  09/19/2017 2:10 AM   Critical care time was exclusive of:  Separately billable procedures and treating other patients   Critical care was necessary to treat or prevent imminent or life-threatening deterioration of the following conditions:  Respiratory failure and circulatory failure   Critical care was time spent personally by me on the following activities:  Development of treatment plan with patient or surrogate, evaluation of patient's response to treatment, examination of patient, obtaining history from patient or surrogate, ordering and performing treatments and interventions, ordering and review of laboratory studies, ordering and review of radiographic studies, pulse oximetry, re-evaluation of patient's condition and review of old charts   I assumed direction of critical care for this patient from another provider in my specialty: no      Critical Care performed: Yes, see critical care note(s)  ____________________________________________   INITIAL IMPRESSION / ASSESSMENT AND PLAN / ED COURSE  As part of my medical decision making, I reviewed the following data within the electronic MEDICAL RECORD NUMBER Notes from prior ED visits and [place] Controlled Substance Database  This is a 62 year old man who comes into the hospital today with some cough shortness of breath and respiratory  distress.  My differential diagnosis includes COPD exacerbation, pneumonia  The patient had some blood work drawn as well as a chest x-ray.  His chest x-ray shows a new right middle lobe airspace disease concerning for pneumonia as well as COPD.  The patient has a white blood cell count of 16.7 and the remaining blood work is unremarkable.  I did give the patient 3 DuoNeb's when he initially arrived.  I also ordered some vancomycin and cefepime for nosocomial acquired pneumonia.  The patient remained tachycardic so I gave him some fluids.  I went back into check on the patient after some time and he was still having respiratory distress with some tachypnea and wheezing.  The decision was made given the patient's distress to go ahead and place him on BiPAP.  Given the patient's pneumonia I did not want to treat him with steroids.  Since the patient still had some respiratory distress as well as some hypoxia with the required increase of O2 the decision was made to admit him.  The nurse states that she tried to get him up to use the restroom and his sats dropped to 89% on his O2.  The patient's oxygen was increased to 3 L instead of 2.  He will be admitted to the hospitalist service for further treatment and evaluation of his pneumonia and COPD exacerbation.      ____________________________________________   FINAL CLINICAL IMPRESSION(S) / ED DIAGNOSES  Final diagnoses:  COPD exacerbation (HCC)  Hypoxia  HCAP (healthcare-associated pneumonia)  Tachycardia     ED Discharge Orders    None       Note:  This document was prepared using Dragon voice recognition software and may include unintentional dictation errors.    Rebecka Apley, MD 09/19/17 [phone]

## 2017-09-19 NOTE — Progress Notes (Signed)
Patient feels better, off BiPAP and oxygen 4 L by nasal cannula. Vital signs reviewed. Physical examinations is done. Continue current treatment.   Discussed with Dr. Belia HemanKasa.

## 2017-09-19 NOTE — Progress Notes (Signed)
eLink Physician-Brief Progress Note Patient Name: Hershal CoriaDennis R Holderness DOB: 31-Mar-1956 MRN: [id number]   Date of Service  09/19/2017  HPI/Events of Note  62 yo male with severe baseline emphysema, 2L O2 chronically, 2 recent admits for AECOPD.  Now with acute resp failure requiring bipap at 40%. CXR c/w pneumonia.   eICU Interventions  Continue abx, steroids.  Wean down/off bipap as tolerated.  Would likely benefit from pulm follow up if not already seeing.         Shane Crutchradeep Shalah Estelle 09/19/2017, 5:12 AM

## 2017-09-19 NOTE — H&P (Signed)
Dustin [name] is an 62 y.o. male.   Chief Complaint: Shortness of breath HPI: The patient with past medical history of diastolic CHF, COPD and hypertension presents to the emergency department complaining of shortness of breath.  The patient was recently discharged from the hospital for COPD exacerbation.  He has completed rehab but has felt more short of breath today.  He denies chest pain.  Chest x-ray in the emergency department revealed pneumonia.  The patient was placed on BiPAP due to increased work of breathing and the emergency department staff called the hospitalist service for admission.  Past Medical History:  Diagnosis Date  . Allergy   . Anxiety   . Anxiety   . Asthma   . Chronic diastolic CHF (congestive heart failure) ([place])    a. echo 07/2013: EF 60-65%, DD, biatrial dilatation, Ao sclerosis, dilated RV, moderate pulmonary HTN, elevated CV and RA pressures; b. patient reported echo at Dr. Laurelyn Sickle office 02/2015 - his office does not have record of him being a pt there c. echo 11/2015: EF 60-65%, Grade 1 DD, mod-severe pulm pressures  . Chronic respiratory failure (HCC)    a. on 2L via nasal cannula; b. secondary to COPD  . COPD (chronic obstructive pulmonary disease) ([place])   . Depression   . Depression   . Depression   . Emphysema of lung ([place])   . GERD (gastroesophageal reflux disease)   . Hypertension   . Personal history of tobacco use, presenting hazards to health 08/17/2015  . Tobacco abuse     Past Surgical History:  Procedure Laterality Date  . ABDOMINAL SURGERY    . ADENOIDECTOMY    . CARPAL TUNNEL RELEASE Right   . corpal tunnel Right   . ELBOW SURGERY Right    repaired tendon  . hemmorhoid N/A   . hemorrh    . NASAL SINUS SURGERY    . NOSE SURGERY    . reflux surgery    . ROTATOR CUFF REPAIR Right   . SHOULDER SURGERY    . TENNIS ELBOW RELEASE/NIRSCHEL PROCEDURE Right   . URETHRA SURGERY     surgery 6 times from age [age] yrs old  . URETHRA SURGERY       Family History  Problem Relation Age of Onset  . CAD Father   . Hyperlipidemia Father   . Stroke Father   . Heart disease Father   . Hypertension Mother   . Peripheral Artery Disease Mother   . Rheum arthritis Mother   . Asthma Mother   . Bipolar disorder Mother   . Depression Mother   . Malignant hypertension Mother   . Diabetes Neg Hx    Social History:  reports that he has quit smoking. His smoking use included e-cigarettes and cigarettes. He has a 4.30 pack-year smoking history. he has never used smokeless tobacco. He reports that he does not drink alcohol or use drugs.  Allergies:  Allergies  Allergen Reactions  . Asa [Aspirin] Other (See Comments)    Reaction: swelling of the right side.  . Bee Venom Swelling  . Codeine Other (See Comments)    Reaction: Difficulty breathing  . Ibuprofen Other (See Comments)    Reaction: Swelling of the right side.     (Not in a hospital admission)  Results for orders placed or performed during the hospital encounter of 09/18/17 (from the past 48 hour(s))  Troponin I     Status: None   Collection Time: 09/18/17  8:46 PM  Result Value Ref Range   Troponin I <0.03 <0.03 ng/mL    Comment: Performed at [facility], [address] [place], [place] [address]  CBC     Status: Abnormal   Collection Time: 09/18/17  8:46 PM  Result Value Ref Range   WBC 16.7 (H) 3.8 - 10.6 K/uL   RBC 4.34 (L) 4.40 - 5.90 MIL/uL   Hemoglobin 13.9 13.0 - 18.0 g/dL   HCT 43.0 40.0 - 52.0 %   MCV 99.3 80.0 - 100.0 fL   MCH 32.0 26.0 - 34.0 pg   MCHC 32.3 32.0 - 36.0 g/dL   RDW 14.5 11.5 - 14.5 %   Platelets 217 150 - 440 K/uL    Comment: Performed at [facility], [address] [place], [place] [address]  Basic metabolic panel     Status: Abnormal   Collection Time: 09/18/17  8:46 PM  Result Value Ref Range   Sodium 144 135 - 145 mmol/L   Potassium 4.3 3.5 - 5.1 mmol/L   Chloride 101 101 - 111 mmol/L   CO2 32 22 - 32  mmol/L   Glucose, Bld 126 (H) 65 - 99 mg/dL   BUN 11 6 - 20 mg/dL   Creatinine, Ser 1.08 0.61 - 1.24 mg/dL   Calcium 9.5 8.9 - 10.3 mg/dL   GFR calc non Af Amer >60 >60 mL/min   GFR calc Af Amer >60 >60 mL/min    Comment: (NOTE) The eGFR has been calculated using the CKD EPI equation. This calculation has not been validated in all clinical situations. eGFR's persistently <60 mL/min signify possible Chronic Kidney Disease.    Anion gap 11 5 - 15    Comment: Performed at [facility], [address] [place], [place] [address]  Influenza panel by PCR (type A & B)     Status: None   Collection Time: 09/19/17 12:27 AM  Result Value Ref Range   Influenza A By PCR NEGATIVE NEGATIVE   Influenza B By PCR NEGATIVE NEGATIVE    Comment: (NOTE) The Xpert Xpress Flu assay is intended as an aid in the diagnosis of  influenza and should not be used as a sole basis for treatment.  This  assay is FDA approved for nasopharyngeal swab specimens only. Nasal  washings and aspirates are unacceptable for Xpert Xpress Flu testing. Performed at [facility], [address] [place], [place] [address]   Culture, expectorated sputum-assessment     Status: None   Collection Time: 09/19/17 12:27 AM  Result Value Ref Range   Specimen Description SPUTUM    Special Requests NONE    Sputum evaluation      THIS SPECIMEN IS ACCEPTABLE FOR SPUTUM CULTURE Performed at [facility], [address] [place], [place] [address]    Report Status 09/19/2017 FINAL   Lactic acid, plasma     Status: None   Collection Time: 09/19/17  2:03 AM  Result Value Ref Range   Lactic Acid, Venous 1.1 0.5 - 1.9 mmol/L    Comment: Performed at [facility], [address], [place], [place] [address]   Dg Chest 2 View  Result Date: 09/18/2017 CLINICAL DATA:  Shortness of breath.  COPD. EXAM: CHEST  2 VIEW COMPARISON:  Two-view chest x-ray 08/06/2017. FINDINGS: Changes of COPD are again  noted. The heart size is normal. Minimal lower lobe airspace disease or scarring is stable. Superimposed right middle lobe airspace disease is new. IMPRESSION: 1. New  right middle lobe airspace disease is concerning for pneumonia. 2. COPD. 3. Stable lower lobe airspace disease, likely atelectasis or scarring. Electronically Signed   By: San Morelle M.D.   On: 09/18/2017 21:04    Review of Systems  Constitutional: Negative for chills and fever.  HENT: Negative for sore throat and tinnitus.   Eyes: Negative for blurred vision and redness.  Respiratory: Positive for cough and shortness of breath.   Cardiovascular: Negative for chest pain, palpitations, orthopnea and PND.  Gastrointestinal: Negative for abdominal pain, diarrhea, nausea and vomiting.  Genitourinary: Negative for dysuria, frequency and urgency.  Musculoskeletal: Negative for joint pain and myalgias.  Skin: Negative for rash.       No lesions  Neurological: Negative for speech change, focal weakness and weakness.  Endo/Heme/Allergies: Does not bruise/bleed easily.       No temperature intolerance  Psychiatric/Behavioral: Negative for depression and suicidal ideas.    Blood pressure 110/72, pulse (!) 125, temperature 98.5 F (36.9 C), temperature source Oral, resp. rate (!) 31, height _0  (1.803 m), weight 97.1 kg (214 lb), SpO2 96 %. Physical Exam  Vitals reviewed. Constitutional: He is oriented to person, place, and time. He appears well-developed and well-nourished. He appears distressed.  HENT:  Head: Normocephalic and atraumatic.  Mouth/Throat: Oropharynx is clear and moist.  Eyes: Conjunctivae and EOM are normal. Pupils are equal, round, and reactive to light. No scleral icterus.  Neck: Normal range of motion. Neck supple. No JVD present. No tracheal deviation present. No thyromegaly present.  Cardiovascular: Normal rate, regular rhythm and normal heart sounds. Exam reveals no gallop and no friction rub.  No  murmur heard. Respiratory: Effort normal and breath sounds normal. No respiratory distress.  GI: Soft. Bowel sounds are normal. He exhibits no distension. There is no tenderness.  Genitourinary:  Genitourinary Comments: Deferred  Musculoskeletal: Normal range of motion. He exhibits no edema.  Lymphadenopathy:    He has no cervical adenopathy.  Neurological: He is alert and oriented to person, place, and time. No cranial nerve deficit.  Skin: Skin is warm and dry. No rash noted. No erythema.  Psychiatric: He has a normal mood and affect. His behavior is normal. Judgment and thought content normal.     Assessment/Plan This is a 62 year old male admitted for acute respiratory failure.  1.  Respiratory failure acute with hypoxia.  Continue BiPAP for respiratory support.  Wean O2 as tolerated. 2.  Sepsis: The patient meets criteria via tachycardia, tachypnea and leukocytosis.  He is hemodynamically stable.  Continue broad-spectrum antibiotics and aggressive IV fluid.  Follow blood cultures for growth and sensitivities.  Collect sputum if possible. 3.  Pneumonia: HCAP; continue vancomycin and cefepime. Add azithromycin 4.  COPD: Continue inhaled corticosteroids and Spiriva.  Albuterol and theophylline scheduled.  5.  DVT prophylaxis: Lovenox 6.  GI prophylaxis: None The patient is a full code.  Time spent on admission orders and critical care approximately 45 minutes.  Discussed with E-Link telemedicine  Harrie Foreman, MD 09/19/2017, 5:03 AM

## 2017-09-19 NOTE — ED Notes (Signed)
Repeat EKG done and given to MD -

## 2017-09-19 NOTE — Care Management (Addendum)
RNCM consult received for assistance with medications (dulera spiriva proair). He has history of frequenting hospital due to respiratory issues. He was signed up with [facility] in March 2017. Per our conversation, patient was receiving medication assistance and has reapplied in September. [facility] if [facility] can assist Ma Rings(Janci RN will speak with patient); [facility]ist Casimiro Needle(Michael) for assistance with medication.  Lives with son- wanting assistance with housing-CSW updated. Per Pulmonary note 08/2017 patient visit- denies problems with inhalers and is on chronic O2 at 2 L/[place]. I attempted to reach out to patient insurance without answer after an extended time on hold.

## 2017-09-19 NOTE — Progress Notes (Signed)
Pharmacy Antibiotic Note  Dustin [name] is a 62 y.o. male admitted on 09/18/2017 with pneumonia.  Pharmacy has been consulted for vanc/cefepime dosing.  Plan: Patient received vanc 1g and cefepime 1g IV x 1 in ED  Will f/u w/ vanc 1.25g IV q12h w/ 6 hour stack dose. Will draw vanc trough 01/03 @ 1900 prior to 4th dose Will start cefepime 2g IV q8h  Ke 0.0742 T1/2 9 ~ 12 hrs Goal trough 15 - 20 mcg/mL  Height: 5\' 11"  (180.3 cm) Weight: 219 lb 9.3 oz (99.6 kg) IBW/kg (Calculated) : 75.3  Temp (24hrs), Avg:99 F (37.2 C), Min:98.5 F (36.9 C), Max:99.5 F (37.5 C)  Recent Labs  Lab 09/18/17 2046 09/19/17 0203  WBC 16.7*  --   CREATININE 1.08  --   LATICACIDVEN  --  1.1    Estimated Creatinine Clearance: 84.2 mL/min (by C-G formula based on SCr of 1.08 mg/dL).    Allergies  Allergen Reactions  . Asa [Aspirin] Other (See Comments)    Reaction: swelling of the right side.  . Bee Venom Swelling  . Codeine Other (See Comments)    Reaction: Difficulty breathing  . Ibuprofen Other (See Comments)    Reaction: Swelling of the right side.   Thank you for allowing pharmacy to be a part of this patient's care.  Thomasene Rippleavid Sharonna Vinje, PharmD, BCPS Clinical Pharmacist 09/19/2017

## 2017-09-19 NOTE — Progress Notes (Signed)
Report given to [name], RN. Patient transferred to room 104 on telemetry monitor. CCMD notified. Patient updated on plan of care. Dustin [name]

## 2017-09-19 NOTE — ED Notes (Signed)
Respiratory in with pt

## 2017-09-19 NOTE — ED Notes (Signed)
Family at bedside. 

## 2017-09-20 LAB — CBC
HCT: 36.5 % — ABNORMAL LOW (ref 40.0–52.0)
HEMOGLOBIN: 11.8 g/dL — AB (ref 13.0–18.0)
MCH: 32.1 pg (ref 26.0–34.0)
MCHC: 32.4 g/dL (ref 32.0–36.0)
MCV: 98.8 fL (ref 80.0–100.0)
Platelets: 174 10*3/uL (ref 150–440)
RBC: 3.7 MIL/uL — ABNORMAL LOW (ref 4.40–5.90)
RDW: 14.1 % (ref 11.5–14.5)
WBC: 15.5 10*3/uL — ABNORMAL HIGH (ref 3.8–10.6)

## 2017-09-20 MED ORDER — GUAIFENESIN-DM 100-10 MG/5ML PO SYRP
5.0000 mL | ORAL_SOLUTION | Freq: Four times a day (QID) | ORAL | Status: DC | PRN
  Administered 2017-09-20 – 2017-09-21 (×3): 5 mL via ORAL
  Filled 2017-09-20 (×4): qty 5

## 2017-09-20 MED ORDER — AZITHROMYCIN 500 MG PO TABS
500.0000 mg | ORAL_TABLET | Freq: Every day | ORAL | Status: DC
  Administered 2017-09-20 – 2017-09-21 (×2): 500 mg via ORAL
  Filled 2017-09-20: qty 1

## 2017-09-20 NOTE — Clinical Social Work Note (Signed)
CSW met with patient and discussed Medicaid application process, gave him information about ALFs, and also low income housing.  CSW explained to patient that he would have to contact DSS to inquire about what housing services are available.  CSW also informed patient that he can call the [place] [address] number and they can direct him to the resources he may need.  CSW asked patient what his current living arrangements are and he said he lives with someone, but they don't get along very often so he is looking to try to find his own place.  Patient was appreciative of resources and information given, CSW to sign off please reconsult if other social work needs arise.  Jones Broom. [place], MSW, [place]  09/20/2017 5:20 PM

## 2017-09-20 NOTE — Final Progress Note (Signed)
Pt seen by Dr. Welton FlakesKhan outpatient as his pulmonologist, will consult, and [facility] with sign off.   Wells Guileseep Laya Letendre, MD.

## 2017-09-20 NOTE — Plan of Care (Signed)
  Progressing Education: Knowledge of General Education information will improve 09/20/2017 1546 - Progressing by Kathreen CosierMalcolm, Kunio Cummiskey A, RN Health Behavior/Discharge Planning: Ability to manage health-related needs will improve 09/20/2017 1546 - Progressing by Kathreen CosierMalcolm, Lathaniel Legate A, RN Clinical Measurements: Ability to maintain clinical measurements within normal limits will improve 09/20/2017 1546 - Progressing by Kathreen CosierMalcolm, Atlas Crossland A, RN Will remain free from infection 09/20/2017 1546 - Progressing by Kathreen CosierMalcolm, Katera Rybka A, RN Diagnostic test results will improve 09/20/2017 1546 - Progressing by Kathreen CosierMalcolm, Janney Priego A, RN Respiratory complications will improve 09/20/2017 1546 - Progressing by Kathreen CosierMalcolm, Atthew Coutant A, RN Cardiovascular complication will be avoided 09/20/2017 1546 - Progressing by Kathreen CosierMalcolm, Jadee Golebiewski A, RN Activity: Risk for activity intolerance will decrease 09/20/2017 1546 - Progressing by Kathreen CosierMalcolm, Myleigh Amara A, RN Nutrition: Adequate nutrition will be maintained 09/20/2017 1546 - Progressing by Kathreen CosierMalcolm, Edelin Fryer A, RN Coping: Level of anxiety will decrease 09/20/2017 1546 - Progressing by Kathreen CosierMalcolm, Lokelani Lutes A, RN Elimination: Will not experience complications related to bowel motility 09/20/2017 1546 - Progressing by Kathreen CosierMalcolm, Shivaay Stormont A, RN Will not experience complications related to urinary retention 09/20/2017 1546 - Progressing by Kathreen CosierMalcolm, Laverle Pillard A, RN Pain Managment: General experience of comfort will improve 09/20/2017 1546 - Progressing by Kathreen CosierMalcolm, Trea Carnegie A, RN Safety: Ability to remain free from injury will improve 09/20/2017 1546 - Progressing by Kathreen CosierMalcolm, Ramzy Cappelletti A, RN Skin Integrity: Risk for impaired skin integrity will decrease 09/20/2017 1546 - Progressing by Kathreen CosierMalcolm, Harmony Sandell A, RN

## 2017-09-20 NOTE — Care Management (Signed)
Contacted patient's pharmacy and informed the following copays for the following meds- Dulera- 49.99, Proair- 9.83 and Spriva 45 dollars. The only other assistance the patient may qualify for is through medicare.gov-  Part D assistance. He may also benefit from discussion with his physician to determine if there are alternative inhalers that are less costly.  These copays are well under the actual cost of the inhalers.  Provided contact information. Patient has recently been [facility] to [name] for care because patient is not available for visits and does not let staff visit.

## 2017-09-20 NOTE — Progress Notes (Signed)
PT Cancellation Note  Patient Details Name: Dustin CoriaDennis R Schinke MRN: [id number] DOB: [birth year]   Cancelled Treatment:    Reason Eval/Treat Not Completed: Other (comment)(Consult received and chart reviewed.  Patient politely declining session at this time, "just don't feel up to it right now".  Did request therapist re-attempt at later time.  Will continue efforts in PM, time permitting.)   Joram Venson H. Manson PasseyBrown, PT, DPT, NCS 09/20/17, 11:09 AM [phone]

## 2017-09-20 NOTE — Progress Notes (Addendum)
[facility] -  at [facility]   PATIENT NAME: Dustin [name]    MR#:  [id number]  DATE OF BIRTH:  [birth year]  SUBJECTIVE:  CHIEF COMPLAINT:   Chief Complaint  Patient presents with  . Shortness of Breath   Better shortness of breath, but still cough with yellowish sputum. On O2 [place] 3 L, off BIPAP. REVIEW OF SYSTEMS:  Review of Systems  Constitutional: Positive for malaise/fatigue. Negative for chills and fever.  HENT: Negative for sore throat.   Eyes: Negative for blurred vision and double vision.  Respiratory: Positive for cough, sputum production, shortness of breath and wheezing. Negative for hemoptysis and stridor.   Cardiovascular: Negative for chest pain, palpitations, orthopnea and leg swelling.  Gastrointestinal: Negative for abdominal pain, blood in stool, diarrhea, melena, nausea and vomiting.  Genitourinary: Negative for dysuria, flank pain and hematuria.  Musculoskeletal: Negative for back pain and joint pain.  Skin: Negative for rash.  Neurological: Negative for dizziness, sensory change, focal weakness, seizures, loss of consciousness, weakness and headaches.  Endo/Heme/Allergies: Negative for polydipsia.  Psychiatric/Behavioral: Negative for depression. The patient is not nervous/anxious.     DRUG ALLERGIES:   Allergies  Allergen Reactions  . Asa [Aspirin] Other (See Comments)    Reaction: swelling of the right side.  . Bee Venom Swelling  . Codeine Other (See Comments)    Reaction: Difficulty breathing  . Ibuprofen Other (See Comments)    Reaction: Swelling of the right side.   VITALS:  Blood pressure 104/61, pulse 97, temperature 97.8 F (36.6 C), temperature source Oral, resp. rate 16, height 5\' 11"  (1.803 m), weight 225 lb (102.1 kg), SpO2 92 %. PHYSICAL EXAMINATION:  Physical Exam  Constitutional: He is oriented to person, place, and time and well-developed, well-nourished, and in no distress.  HENT:  Head: Normocephalic.    Mouth/Throat: Oropharynx is clear and moist.  Eyes: Conjunctivae and EOM are normal. Pupils are equal, round, and reactive to light. No scleral icterus.  Neck: Normal range of motion. Neck supple. No JVD present. No tracheal deviation present.  Cardiovascular: Normal rate, regular rhythm and normal heart sounds. Exam reveals no gallop.  No murmur heard. Pulmonary/Chest: Effort normal. No respiratory distress. He has wheezes. He has no rales.  Abdominal: Soft. Bowel sounds are normal. He exhibits no distension. There is no tenderness. There is no rebound.  Musculoskeletal: Normal range of motion. He exhibits no edema or tenderness.  Neurological: He is alert and oriented to person, place, and time. No cranial nerve deficit.  Skin: No rash noted. No erythema.  Psychiatric: Affect normal.   LABORATORY PANEL:  Male CBC Recent Labs  Lab 09/20/17 0743  WBC 15.5*  HGB 11.8*  HCT 36.5*  PLT 174   ------------------------------------------------------------------------------------------------------------------ Chemistries  Recent Labs  Lab 09/18/17 2046  NA 144  K 4.3  CL 101  CO2 32  GLUCOSE 126*  BUN 11  CREATININE 1.08  CALCIUM 9.5   RADIOLOGY:  No results found. ASSESSMENT AND PLAN:   This is a 62 year old male admitted for acute respiratory failure.  1.  Acute on chronic espiratory failure with hypoxia.   Off BiPAP, try to wean O2 as tolerated. NEB q6h. On home O2 [place] 2L.  2.  Sepsis: The patient meets criteria via tachycardia, tachypnea and leukocytosis.   Continue broad-spectrum antibiotics and aggressive IV fluid.  Follow CBC, blood cultures for growth and sensitivities.  3.  Pneumonia: HCAP; continue cefepime and azithromycin 4.  COPD: Continue inhaled corticosteroids  and Spiriva.  Albuterol and theophylline scheduled.  All the records are reviewed and case discussed with Care Management/Social Worker. Management plans discussed with the patient, family and they  are in agreement.  CODE STATUS: Full Code  TOTAL TIME TAKING CARE OF THIS PATIENT: 37 minutes.   More than 50% of the time was spent in counseling/coordination of care: YES  POSSIBLE D/C IN 2 DAYS, DEPENDING ON CLINICAL CONDITION.   Shaune Pollack M.D on 09/20/2017 at 12:53 PM  Between 7am to 6pm - Pager - [phone]  After 6pm go to [email or web address] - Therapist, nutritional Hospitalists

## 2017-09-21 LAB — BASIC METABOLIC PANEL
Anion gap: 6 (ref 5–15)
BUN: 27 mg/dL — AB (ref 6–20)
CHLORIDE: 101 mmol/L (ref 101–111)
CO2: 33 mmol/L — AB (ref 22–32)
CREATININE: 1.06 mg/dL (ref 0.61–1.24)
Calcium: 8.8 mg/dL — ABNORMAL LOW (ref 8.9–10.3)
GFR calc Af Amer: >60 mL/min (ref >60)
GFR calc non Af Amer: >60 mL/min (ref >60)
Glucose, Bld: 120 mg/dL — ABNORMAL HIGH (ref 65–99)
Potassium: 3.6 mmol/L (ref 3.5–5.1)
Sodium: 140 mmol/L (ref 135–145)

## 2017-09-21 LAB — CULTURE, RESPIRATORY

## 2017-09-21 LAB — CBC
HEMATOCRIT: 36.7 % — AB (ref 40.0–52.0)
HEMOGLOBIN: 12 g/dL — AB (ref 13.0–18.0)
MCH: 32.3 pg (ref 26.0–34.0)
MCHC: 32.8 g/dL (ref 32.0–36.0)
MCV: 98.6 fL (ref 80.0–100.0)
Platelets: 199 10*3/uL (ref 150–440)
RBC: 3.73 MIL/uL — ABNORMAL LOW (ref 4.40–5.90)
RDW: 14.1 % (ref 11.5–14.5)
WBC: 15.7 10*3/uL — ABNORMAL HIGH (ref 3.8–10.6)

## 2017-09-21 LAB — CULTURE, RESPIRATORY W GRAM STAIN

## 2017-09-21 MED ORDER — SENNOSIDES-DOCUSATE SODIUM 8.6-50 MG PO TABS
2.0000 | ORAL_TABLET | Freq: Two times a day (BID) | ORAL | Status: DC
  Administered 2017-09-21 – 2017-09-23 (×3): 2 via ORAL
  Filled 2017-09-21 (×6): qty 2

## 2017-09-21 MED ORDER — AMOXICILLIN 500 MG PO CAPS
500.0000 mg | ORAL_CAPSULE | Freq: Three times a day (TID) | ORAL | Status: DC
  Administered 2017-09-21 – 2017-09-24 (×10): 500 mg via ORAL
  Filled 2017-09-21 (×11): qty 1

## 2017-09-21 MED ORDER — TEMAZEPAM 7.5 MG PO CAPS
7.5000 mg | ORAL_CAPSULE | Freq: Every evening | ORAL | Status: DC | PRN
  Administered 2017-09-21 – 2017-09-22 (×2): 7.5 mg via ORAL
  Filled 2017-09-21 (×2): qty 1

## 2017-09-21 MED ORDER — POTASSIUM CHLORIDE CRYS ER 10 MEQ PO TBCR
10.0000 meq | EXTENDED_RELEASE_TABLET | Freq: Every day | ORAL | Status: DC
  Administered 2017-09-21 – 2017-09-24 (×4): 10 meq via ORAL
  Filled 2017-09-21 (×4): qty 1

## 2017-09-21 NOTE — Care Management Important Message (Signed)
Important Message  Patient Details  Name: Dustin [name] MRN: [id number] Date of Birth: Sep 25, 1955   Medicare Important Message Given:  Yes  Signed IM notice given   Eber HongGreene, Zymiere Trostle R, RN 09/21/2017, 8:28 AM

## 2017-09-21 NOTE — Care Management (Signed)
Spoke with patient regarding [name] report that he was not available for home visit, there fore they would not resume care.  Patient denies this claim.  Contacted Tim with [name] to discuss.  he will follow up with CM

## 2017-09-21 NOTE — Care Management (Addendum)
[name] made final decision not to resume care for this patient "baded on his community behavior."  Contacted [facility] and explained situation.  Accepted referral. Requesting order for RN PT OT

## 2017-09-21 NOTE — Progress Notes (Signed)
PT Cancellation Note  Patient Details Name: Dustin CoriaDennis R Armentor MRN: [id number] DOB: 01/21/1956   Cancelled Treatment:    Reason Eval/Treat Not Completed: Other (comment)(Evaluation re-attempted.  Patient currently eating breakfast, significant coughing noted (feels he "choked on breakfast").  RN informed/aware and at bedside for assessment.  Sats 85-86% on 3L.  Will hold PT at this time and re-attempt at later time/date.)   Salam Chesterfield H. Manson PasseyBrown, PT, DPT, NCS 09/21/17, 10:04 AM [phone]

## 2017-09-21 NOTE — Progress Notes (Signed)
[facility] - [place] at [facility]   PATIENT NAME: Dustin [name]    MR#:  [id number]  DATE OF BIRTH:  [birth year]  SUBJECTIVE:  CHIEF COMPLAINT:   Chief Complaint  Patient presents with  . Shortness of Breath   Better shortness of breath, but still cough with yellowish sputum. Feels anxious. On O2 [place] 4 L.Marland Kitchen REVIEW OF SYSTEMS:  Review of Systems  Constitutional: Positive for malaise/fatigue. Negative for chills and fever.  HENT: Negative for sore throat.   Eyes: Negative for blurred vision and double vision.  Respiratory: Positive for cough, sputum production, shortness of breath and wheezing. Negative for hemoptysis and stridor.   Cardiovascular: Negative for chest pain, palpitations, orthopnea and leg swelling.  Gastrointestinal: Negative for abdominal pain, blood in stool, diarrhea, melena, nausea and vomiting.  Genitourinary: Negative for dysuria, flank pain and hematuria.  Musculoskeletal: Negative for back pain and joint pain.  Skin: Negative for rash.  Neurological: Negative for dizziness, sensory change, focal weakness, seizures, loss of consciousness, weakness and headaches.  Endo/Heme/Allergies: Negative for polydipsia.  Psychiatric/Behavioral: Negative for depression. The patient is nervous/anxious.     DRUG ALLERGIES:   Allergies  Allergen Reactions  . Asa [Aspirin] Other (See Comments)    Reaction: swelling of the right side.  . Bee Venom Swelling  . Codeine Other (See Comments)    Reaction: Difficulty breathing  . Ibuprofen Other (See Comments)    Reaction: Swelling of the right side.   VITALS:  Blood pressure 122/84, pulse (!) 118, temperature 98.7 F (37.1 C), temperature source Oral, resp. rate 14, height 5\' 11"  (1.803 m), weight 231 lb (104.8 kg), SpO2 96 %. PHYSICAL EXAMINATION:  Physical Exam  Constitutional: He is oriented to person, place, and time and well-developed, well-nourished, and in no distress.  HENT:  Head:  Normocephalic.  Mouth/Throat: Oropharynx is clear and moist.  Eyes: Conjunctivae and EOM are normal. Pupils are equal, round, and reactive to light. No scleral icterus.  Neck: Normal range of motion. Neck supple. No JVD present. No tracheal deviation present.  Cardiovascular: Normal rate, regular rhythm and normal heart sounds. Exam reveals no gallop.  No murmur heard. Pulmonary/Chest: Effort normal. No respiratory distress. He has wheezes. He has no rales.  Abdominal: Soft. Bowel sounds are normal. He exhibits no distension. There is no tenderness. There is no rebound.  Musculoskeletal: Normal range of motion. He exhibits no edema or tenderness.  Neurological: He is alert and oriented to person, place, and time. No cranial nerve deficit.  Skin: No rash noted. No erythema.  Psychiatric: Affect normal.   LABORATORY PANEL:  Male CBC Recent Labs  Lab 09/21/17 0528  WBC 15.7*  HGB 12.0*  HCT 36.7*  PLT 199   ------------------------------------------------------------------------------------------------------------------ Chemistries  Recent Labs  Lab 09/21/17 0528  NA 140  K 3.6  CL 101  CO2 33*  GLUCOSE 120*  BUN 27*  CREATININE 1.06  CALCIUM 8.8*   RADIOLOGY:  No results found. ASSESSMENT AND PLAN:   This is a 62 year old male admitted for acute respiratory failure.  1.  Acute on chronic espiratory failure with hypoxia.   Off BiPAP, try to wean O2 as tolerated. NEB q6h. On home O2 [place] 2L.  2.  Sepsis: The patient meets criteria via tachycardia, tachypnea and leukocytosis.   Discontinue broad-spectrum antibiotics, change to p.o. amoxicillin.  Follow CBC, blood cultures is negative so far.  Sputum culture shows:ABUNDANT HAEMOPHILUS INFLUENZAE, BETA LACTAMASE NEGATIVE  3.  Pneumonia: HCAP; continue cefepime  and azithromycin, change to p.o. amoxicillin. 4.  COPD: Continue inhaled corticosteroids and Spiriva.  Albuterol and theophylline scheduled.  He needs pulmonary  rehab.  Dehydration.  Encourage oral fluid intake.  Generalized weakness.  PT evaluation suggest home health and PT.  All the records are reviewed and case discussed with Care Management/Social Worker. Management plans discussed with the patient, his sister and they are in agreement.  CODE STATUS: Full Code  TOTAL TIME TAKING CARE OF THIS PATIENT: 37 minutes.   More than 50% of the time was spent in counseling/coordination of care: YES  POSSIBLE D/C IN 2 DAYS, DEPENDING ON CLINICAL CONDITION.   Shaune PollackQing Antone Summons M.D on 09/21/2017 at 3:56 PM  Between 7am to 6pm - Pager - [phone]  After 6pm go to [email or web address] - Therapist, nutritionalpassword [facility]AS [facility]  [facility] [place] [facility]

## 2017-09-21 NOTE — Discharge Instructions (Signed)
Continue Home O2 [place]. Pulmonary rehab. HHPT.

## 2017-09-21 NOTE — Progress Notes (Signed)
Pt complaining for increased anxiety .  Requested his xanax early.  Given to patient and encouraged pt to relax and try to sleep.  Turned TV off and lights off. Henriette CombsSarah Breeze Berringer RN

## 2017-09-21 NOTE — Plan of Care (Signed)
  Progressing Education: Knowledge of General Education information will improve 09/21/2017 1139 - Progressing by Kathreen CosierMalcolm, Karissa Meenan A, RN Health Behavior/Discharge Planning: Ability to manage health-related needs will improve 09/21/2017 1139 - Progressing by Kathreen CosierMalcolm, Travelle Mcclimans A, RN Clinical Measurements: Ability to maintain clinical measurements within normal limits will improve 09/21/2017 1139 - Progressing by Kathreen CosierMalcolm, Da Michelle A, RN Will remain free from infection 09/21/2017 1139 - Progressing by Kathreen CosierMalcolm, Brigett Estell A, RN Diagnostic test results will improve 09/21/2017 1139 - Progressing by Kathreen CosierMalcolm, Aashvi Rezabek A, RN Respiratory complications will improve 09/21/2017 1139 - Progressing by Kathreen CosierMalcolm, Dimitrios Balestrieri A, RN Cardiovascular complication will be avoided 09/21/2017 1139 - Progressing by Kathreen CosierMalcolm, Vinisha Faxon A, RN Activity: Risk for activity intolerance will decrease 09/21/2017 1139 - Progressing by Kathreen CosierMalcolm, Zanyla Klebba A, RN Nutrition: Adequate nutrition will be maintained 09/21/2017 1139 - Progressing by Kathreen CosierMalcolm, Marg Macmaster A, RN Coping: Level of anxiety will decrease 09/21/2017 1139 - Progressing by Kathreen CosierMalcolm, Carlin Attridge A, RN Elimination: Will not experience complications related to bowel motility 09/21/2017 1139 - Progressing by Kathreen CosierMalcolm, Curtiss Mahmood A, RN Will not experience complications related to urinary retention 09/21/2017 1139 - Progressing by Kathreen CosierMalcolm, Isabel Freese A, RN Pain Managment: General experience of comfort will improve 09/21/2017 1139 - Progressing by Kathreen CosierMalcolm, Sally-Anne Wamble A, RN Safety: Ability to remain free from injury will improve 09/21/2017 1139 - Progressing by Kathreen CosierMalcolm, Maansi Wike A, RN Skin Integrity: Risk for impaired skin integrity will decrease 09/21/2017 1139 - Progressing by Kathreen CosierMalcolm, Byanca Kasper A, RN

## 2017-09-21 NOTE — Evaluation (Signed)
Physical Therapy Evaluation Patient Details Name: Dustin [name] MRN: [id number] DOB: September 08, 1956 Today's Date: 09/21/2017   History of Present Illness  presented to ER secondary to SOB; admitted with sepsis, respiratory failure with hypoxia related to HCAP.  Initially requiring BiPAP, now transitioned to 3L supplemental O2.  Clinical Impression  Upon evaluation, patient alert and oriented; follows commands and demonstrates good insight/safety awareness.  Bilat UE/LE strength and ROM grossly symmetrical and WFL for basic transfers and mobility.  Demonstrates ability to complete bed mobility indep; sit/stand, basic transfers and gait (60') with RW, close sup/mod indep.  No buckling, LOB or safety concerns; good awareness of signs/symptoms of fatigue and intermittent rest periods.  Desat to 85-86% with exertion, though recovers to >90% within 30 seconds of seated rest and pursed lip breathing. Would benefit from skilled PT to address above deficits and promote optimal return to PLOF; Recommend transition to HHPT upon discharge from acute hospitalization to address cardiopulmonary endurance deficits.  Would be appropriate for transition to outpatient pulmonary rehab as appropriate.     Follow Up Recommendations Home health PT(with transition to outpatient pulmonary rehab as appropriate)    Equipment Recommendations       Recommendations for Other Services       Precautions / Restrictions Precautions Precautions: Fall Restrictions Weight Bearing Restrictions: No      Mobility  Bed Mobility Overal bed mobility: Modified Independent                Transfers Overall transfer level: Modified independent Equipment used: Rolling walker (2 wheeled)                Ambulation/Gait Ambulation/Gait assistance: Supervision Ambulation Distance (Feet): 60 Feet Assistive device: Rolling walker (2 wheeled)       General Gait Details: reciprocal stepping pattern with slow, but  steady, cadence.  Good awareness of need for rest periods and self-initiated rest breaks.  Stairs            Wheelchair Mobility    Modified Rankin (Stroke Patients Only)       Balance Overall balance assessment: Needs assistance Sitting-balance support: No upper extremity supported;Feet supported Sitting balance-Leahy Scale: Good     Standing balance support: Bilateral upper extremity supported Standing balance-Leahy Scale: Good                               Pertinent Vitals/Pain Pain Assessment: No/denies pain    Home Living Family/patient expects to be discharged to:: Private residence Living Arrangements: Children Available Help at Discharge: Family Type of Home: House Home Access: Stairs to enter Entrance Stairs-Rails: Doctor, general practiceight;Left Entrance Stairs-Number of Steps: 3  Home Layout: One level Home Equipment: Environmental consultantWalker - 2 wheels;Shower seat;Grab bars - tub/shower;Walker - 4 wheels Additional Comments: Pt lives with his son and daughter in [place]. Son is a Naval architecttruck driver and is gone during the day.    Prior Function Level of Independence: Independent         Comments: Indep with ADLs, household and limited community distances; home O2 at 2-3L. Denies fall history.     Hand Dominance        Extremity/Trunk Assessment   Upper Extremity Assessment Upper Extremity Assessment: Overall WFL for tasks assessed    Lower Extremity Assessment Lower Extremity Assessment: Overall WFL for tasks assessed(grossly 4+/5 throughout)       Communication   Communication: No difficulties  Cognition Arousal/Alertness: Awake/alert Behavior [facility]  for tasks assessed/performed Overall Cognitive Status: Within Functional Limits for tasks assessed                                        General Comments      Exercises     Assessment/Plan    PT Assessment Patient needs continued PT services  PT Problem List Cardiopulmonary status  limiting activity;Decreased activity tolerance       PT Treatment Interventions DME instruction;Gait training;Stair training;Balance training;Functional mobility training;Therapeutic activities;Therapeutic exercise;Patient/family education    PT Goals (Current goals can be found in the Care Plan section)  Acute Rehab PT Goals Patient Stated Goal: to return home PT Goal Formulation: With patient Time For Goal Achievement: 10/05/17 Potential to Achieve Goals: Good    Frequency Min 2X/week   Barriers to discharge        Co-evaluation               AM-PAC PT "6 Clicks" Daily Activity  Outcome Measure Difficulty turning over in bed (including adjusting bedclothes, sheets and blankets)?: None Difficulty moving from lying on back to sitting on the side of the bed? : None Difficulty sitting down on and standing up from a chair with arms (e.g., wheelchair, bedside commode, etc,.)?: A Little Help needed moving to and from a bed to chair (including a wheelchair)?: A Little Help needed walking in hospital room?: A Little Help needed climbing 3-5 steps with a railing? : A Little 6 Click Score: 20    End of Session Equipment Utilized During Treatment: Gait belt;Oxygen Activity Tolerance: Patient tolerated treatment well Patient left: in chair;with call bell/phone within reach(fall risk score 8, alarm not required) Nurse Communication: Mobility status PT Visit Diagnosis: Difficulty in walking, not elsewhere classified (R26.2)    Time: 8413-2440 PT Time Calculation (min) (ACUTE ONLY): 22 min   Charges:   PT Evaluation $PT Eval Low Complexity: 1 Low     PT G Codes:   PT G-Codes **NOT FOR INPATIENT CLASS** Functional Assessment Tool Used: AM-PAC 6 Clicks Basic Mobility Functional Limitation: Mobility: Walking and moving around Mobility: Walking and Moving Around Current Status (N0272): At least 20 percent but less than 40 percent impaired, limited or restricted Mobility:  Walking and Moving Around Goal Status [phone]): At least 1 percent but less than 20 percent impaired, limited or restricted    Dustin [name] H. Manson Passey, PT, DPT, NCS 09/21/17, 1:19 PM [phone]

## 2017-09-22 LAB — CBC
HEMATOCRIT: 39.5 % — AB (ref 40.0–52.0)
HEMOGLOBIN: 13.1 g/dL (ref 13.0–18.0)
MCH: 32.7 pg (ref 26.0–34.0)
MCHC: 33.1 g/dL (ref 32.0–36.0)
MCV: 98.8 fL (ref 80.0–100.0)
Platelets: 200 10*3/uL (ref 150–440)
RBC: 3.99 MIL/uL — AB (ref 4.40–5.90)
RDW: 14.2 % (ref 11.5–14.5)
WBC: 10.4 10*3/uL (ref 3.8–10.6)

## 2017-09-22 LAB — BASIC METABOLIC PANEL
Anion gap: 7 (ref 5–15)
BUN: 25 mg/dL — ABNORMAL HIGH (ref 6–20)
CALCIUM: 8.9 mg/dL (ref 8.9–10.3)
CO2: 35 mmol/L — ABNORMAL HIGH (ref 22–32)
Chloride: 100 mmol/L — ABNORMAL LOW (ref 101–111)
Creatinine, Ser: 1.08 mg/dL (ref 0.61–1.24)
GFR calc Af Amer: >60 mL/min (ref >60)
GLUCOSE: 99 mg/dL (ref 65–99)
POTASSIUM: 4 mmol/L (ref 3.5–5.1)
Sodium: 142 mmol/L (ref 135–145)

## 2017-09-22 MED ORDER — ALBUTEROL SULFATE (2.5 MG/3ML) 0.083% IN NEBU: 2.5000 mg | INHALATION_SOLUTION | RESPIRATORY_TRACT | Status: DC | PRN

## 2017-09-22 MED ORDER — IPRATROPIUM-ALBUTEROL 0.5-2.5 (3) MG/3ML IN SOLN
3.0000 mL | Freq: Four times a day (QID) | RESPIRATORY_TRACT | Status: DC
  Administered 2017-09-22 – 2017-09-24 (×10): 3 mL via RESPIRATORY_TRACT
  Filled 2017-09-22 (×10): qty 3

## 2017-09-22 NOTE — Progress Notes (Signed)
[facility] - [place] [facility]   PATIENT NAME: Dustin [name]    MR#:  [id number]  DATE OF BIRTH:  Aug 09, 1956  SUBJECTIVE:  CHIEF COMPLAINT:   Chief Complaint  Patient presents with  . Shortness of Breath   Better shortness of breath, but still cough with yellowish sputum. Feels anxious. On O2 [place] 3 L. REVIEW OF SYSTEMS:  Review of Systems  Constitutional: Positive for malaise/fatigue. Negative for chills and fever.  HENT: Negative for sore throat.   Eyes: Negative for blurred vision and double vision.  Respiratory: Positive for cough, sputum production, shortness of breath and wheezing. Negative for hemoptysis and stridor.   Cardiovascular: Negative for chest pain, palpitations, orthopnea and leg swelling.  Gastrointestinal: Negative for abdominal pain, blood in stool, diarrhea, melena, nausea and vomiting.  Genitourinary: Negative for dysuria, flank pain and hematuria.  Musculoskeletal: Negative for back pain and joint pain.  Skin: Negative for rash.  Neurological: Negative for dizziness, sensory change, focal weakness, seizures, loss of consciousness, weakness and headaches.  Endo/Heme/Allergies: Negative for polydipsia.  Psychiatric/Behavioral: Negative for depression. The patient is not nervous/anxious.     DRUG ALLERGIES:   Allergies  Allergen Reactions  . Asa [Aspirin] Other (See Comments)    Reaction: swelling of the right side.  . Bee Venom Swelling  . Codeine Other (See Comments)    Reaction: Difficulty breathing  . Ibuprofen Other (See Comments)    Reaction: Swelling of the right side.   VITALS:  Blood pressure 101/63, pulse 98, temperature 98.8 F (37.1 C), temperature source Oral, resp. rate (!) 22, height 5\' 11"  (1.803 m), weight 226 lb 2 oz (102.6 kg), SpO2 95 %. PHYSICAL EXAMINATION:  Physical Exam  Constitutional: He is oriented to person, place, and time and well-developed, well-nourished, and in no distress.  HENT:  Head:  Normocephalic.  Mouth/Throat: Oropharynx is clear and moist.  Eyes: Conjunctivae and EOM are normal. Pupils are equal, round, and reactive to light. No scleral icterus.  Neck: Normal range of motion. Neck supple. No JVD present. No tracheal deviation present.  Cardiovascular: Normal rate, regular rhythm and normal heart sounds. Exam reveals no gallop.  No murmur heard. Pulmonary/Chest: Effort normal. No respiratory distress. He has wheezes. He has no rales.  Abdominal: Soft. Bowel sounds are normal. He exhibits no distension. There is no tenderness. There is no rebound.  Musculoskeletal: Normal range of motion. He exhibits no edema or tenderness.  Neurological: He is alert and oriented to person, place, and time. No cranial nerve deficit.  Skin: No rash noted. No erythema.  Psychiatric: Affect normal.   LABORATORY PANEL:  Male CBC Recent Labs  Lab 09/22/17 0441  WBC 10.4  HGB 13.1  HCT 39.5*  PLT 200   ------------------------------------------------------------------------------------------------------------------ Chemistries  Recent Labs  Lab 09/22/17 0441  NA 142  K 4.0  CL 100*  CO2 35*  GLUCOSE 99  BUN 25*  CREATININE 1.08  CALCIUM 8.9   RADIOLOGY:  No results found. ASSESSMENT AND PLAN:   This is a 62 year old male admitted for acute respiratory failure.  1.  Acute on chronic espiratory failure with hypoxia.   Off BiPAP, try to wean O2 as tolerated. NEB q6h. On home O2 [place] 2L.  2.  Sepsis: The patient meets criteria via tachycardia, tachypnea and leukocytosis.   Discontinue broad-spectrum antibiotics, change to p.o. amoxicillin.  Follow CBC, blood cultures is negative so far.  Sputum culture shows:ABUNDANT HAEMOPHILUS INFLUENZAE, BETA LACTAMASE NEGATIVE  3.  Pneumonia:  HCAP; continue cefepime and azithromycin, changed to p.o. amoxicillin. 4.  COPD: Continue inhaled corticosteroids and Spiriva.  Albuterol and theophylline scheduled.  He needs pulmonary  rehab.  Dehydration.  Encourage oral fluid intake.  Generalized weakness.  PT evaluation suggest home health and PT.  All the records are reviewed and case discussed with Care Management/Social Worker. Management plans discussed with the patient, and they are in agreement.  CODE STATUS: Full Code  TOTAL TIME TAKING CARE OF THIS PATIENT: 27 minutes.   More than 50% of the time was spent in counseling/coordination of care: YES  POSSIBLE D/C IN 1-2 DAYS, DEPENDING ON CLINICAL CONDITION.   Shaune PollackQing Izaah Westman M.D on 09/22/2017 at 1:18 PM  Between 7am to 6pm - Pager - [phone]  After 6pm go to [email or web address] - Therapist, nutritionalpassword EPAS [facility]  [facility] [place] [facility]

## 2017-09-22 NOTE — Plan of Care (Signed)
  Progressing Education: Knowledge of General Education information will improve 09/22/2017 1652 - Progressing by Donnel SaxonKennedy, Kealani Leckey L, RN 09/22/2017 1650 - Progressing by Donnel SaxonKennedy, Fizza Scales L, RN Health Behavior/Discharge Planning: Ability to manage health-related needs will improve 09/22/2017 1652 - Progressing by Donnel SaxonKennedy, Shalev Helminiak L, RN 09/22/2017 1650 - Progressing by Donnel SaxonKennedy, Lakira Ogando L, RN Clinical Measurements: Ability to maintain clinical measurements within normal limits will improve 09/22/2017 1652 - Progressing by Donnel SaxonKennedy, Nester Bachus L, RN 09/22/2017 1650 - Progressing by Donnel SaxonKennedy, Dvaughn Fickle L, RN Will remain free from infection 09/22/2017 1652 - Progressing by Donnel SaxonKennedy, Zyeir Dymek L, RN 09/22/2017 1650 - Progressing by Donnel SaxonKennedy, Manette Doto L, RN Diagnostic test results will improve 09/22/2017 1652 - Progressing by Donnel SaxonKennedy, Brianne Maina L, RN 09/22/2017 1650 - Progressing by Donnel SaxonKennedy, Neyla Gauntt L, RN Respiratory complications will improve 09/22/2017 1652 - Progressing by Donnel SaxonKennedy, Carlynn Leduc L, RN 09/22/2017 1650 - Progressing by Donnel SaxonKennedy, Naleigha Raimondi L, RN Cardiovascular complication will be avoided 09/22/2017 1652 - Progressing by Donnel SaxonKennedy, Aimee Heldman L, RN 09/22/2017 1650 - Progressing by Donnel SaxonKennedy, Hajra Port L, RN Activity: Risk for activity intolerance will decrease 09/22/2017 1652 - Progressing by Donnel SaxonKennedy, Eular Panek L, RN 09/22/2017 1650 - Progressing by Donnel SaxonKennedy, Avanna Sowder L, RN Nutrition: Adequate nutrition will be maintained 09/22/2017 1652 - Progressing by Donnel SaxonKennedy, Chiffon Kittleson L, RN 09/22/2017 1650 - Progressing by Donnel SaxonKennedy, Chellie Vanlue L, RN Coping: Level of anxiety will decrease 09/22/2017 1652 - Progressing by Donnel SaxonKennedy, Kahiau Schewe L, RN 09/22/2017 1650 - Progressing by Donnel SaxonKennedy, Alison Kubicki L, RN Elimination: Will not experience complications related to bowel motility 09/22/2017 1652 - Progressing by Donnel SaxonKennedy, Shatora Weatherbee L, RN 09/22/2017 1650 - Progressing by Donnel SaxonKennedy, Ashe Graybeal L, RN Will not experience complications related  to urinary retention 09/22/2017 1652 - Progressing by Donnel SaxonKennedy, Daron Stutz L, RN 09/22/2017 1650 - Progressing by Donnel SaxonKennedy, Faun Mcqueen L, RN Pain Managment: General experience of comfort will improve 09/22/2017 1652 - Progressing by Donnel SaxonKennedy, Starlee Corralejo L, RN 09/22/2017 1650 - Progressing by Donnel SaxonKennedy, Nikai Quest L, RN Safety: Ability to remain free from injury will improve 09/22/2017 1652 - Progressing by Donnel SaxonKennedy, Duru Reiger L, RN 09/22/2017 1650 - Progressing by Donnel SaxonKennedy, Radford Pease L, RN Skin Integrity: Risk for impaired skin integrity will decrease 09/22/2017 1652 - Progressing by Donnel SaxonKennedy, Jesselle Laflamme L, RN 09/22/2017 1650 - Progressing by Donnel SaxonKennedy, Calisa Luckenbaugh L, RN

## 2017-09-22 NOTE — Plan of Care (Signed)
  Progressing Education: Knowledge of General Education information will improve 09/22/2017 1650 - Progressing by Donnel SaxonKennedy, Necha Harries L, RN Health Behavior/Discharge Planning: Ability to manage health-related needs will improve 09/22/2017 1650 - Progressing by Donnel SaxonKennedy, Jaylen Knope L, RN Clinical Measurements: Ability to maintain clinical measurements within normal limits will improve 09/22/2017 1650 - Progressing by Donnel SaxonKennedy, Abagail Limb L, RN Will remain free from infection 09/22/2017 1650 - Progressing by Donnel SaxonKennedy, Estefanie Cornforth L, RN Diagnostic test results will improve 09/22/2017 1650 - Progressing by Donnel SaxonKennedy, Britt Petroni L, RN Respiratory complications will improve 09/22/2017 1650 - Progressing by Donnel SaxonKennedy, Da Michelle L, RN Cardiovascular complication will be avoided 09/22/2017 1650 - Progressing by Donnel SaxonKennedy, Onyx Edgley L, RN Activity: Risk for activity intolerance will decrease 09/22/2017 1650 - Progressing by Donnel SaxonKennedy, Darryl Willner L, RN Nutrition: Adequate nutrition will be maintained 09/22/2017 1650 - Progressing by Donnel SaxonKennedy, Tigran Haynie L, RN Coping: Level of anxiety will decrease 09/22/2017 1650 - Progressing by Donnel SaxonKennedy, Tritia Endo L, RN Elimination: Will not experience complications related to bowel motility 09/22/2017 1650 - Progressing by Donnel SaxonKennedy, Saidee Geremia L, RN Will not experience complications related to urinary retention 09/22/2017 1650 - Progressing by Donnel SaxonKennedy, Norberta Stobaugh L, RN Pain Managment: General experience of comfort will improve 09/22/2017 1650 - Progressing by Donnel SaxonKennedy, Zacory Fiola L, RN Safety: Ability to remain free from injury will improve 09/22/2017 1650 - Progressing by Donnel SaxonKennedy, Lorren Rossetti L, RN Skin Integrity: Risk for impaired skin integrity will decrease 09/22/2017 1650 - Progressing by Donnel SaxonKennedy, Junelle Hashemi L, RN

## 2017-09-23 MED ORDER — AMOXICILLIN 500 MG PO CAPS: 500.0000 mg | ORAL_CAPSULE | Freq: Three times a day (TID) | ORAL | 0 refills | Status: DC

## 2017-09-23 MED ORDER — GUAIFENESIN-DM 100-10 MG/5ML PO SYRP: 5.0000 mL | ORAL_SOLUTION | Freq: Four times a day (QID) | ORAL | 0 refills | Status: DC | PRN

## 2017-09-23 MED ORDER — ALPRAZOLAM 1 MG PO TABS
1.0000 mg | ORAL_TABLET | Freq: Three times a day (TID) | ORAL | Status: DC
  Administered 2017-09-23 – 2017-09-24 (×3): 1 mg via ORAL
  Filled 2017-09-23 (×3): qty 1

## 2017-09-23 NOTE — Progress Notes (Signed)
Patient had increased SOB compared to baseline, respirations 30BPM while using urinal in bed, 02 90% on RA, HR 120. Notified Dr. Imogene Burnhen of status, verbal order received to keep patient another day, physical therapy consulted for second time to reevaluate patient's ability to perform ADL's for safety.

## 2017-09-23 NOTE — Care Management Note (Signed)
Case Management Note  Patient Details  Name: Dustin [name] MRN: [id number] Date of Birth: [birth year]  Subjective/Objective:    [facility]. Discharged home today.                 Action/Plan:   Expected Discharge Date:  09/23/17               Expected Discharge Plan:  Home w Home Health Services  In-House Referral:     Discharge planning Services  Medication Assistance, CM Consult  Post Acute Care Choice:    Choice offered to:  Patient  DME Arranged:    DME Agency:     HH Arranged:  PT, OT, RN HH Agency:  [facility]  Status of Service:  Completed, signed off  If discussed at Long Length of Stay Meetings, dates discussed:    Additional Comments:  Cuthbert Turton A, RN 09/23/2017, 9:53 AM

## 2017-09-23 NOTE — Discharge Summary (Addendum)
[employer] - [place] at [facility]   PATIENT NAME: Dustin [name]    MR#:  [id number]  DATE OF BIRTH:  12-Jun-1956  DATE OF ADMISSION:  09/18/2017   ADMITTING PHYSICIAN: Arnaldo Natal, MD  DATE OF DISCHARGE: 09/23/2017 PRIMARY CARE PHYSICIAN: Lyndon Code, MD   ADMISSION DIAGNOSIS:  Tachycardia [R00.0] Hypoxia [R09.02] COPD exacerbation (HCC) [J44.1] HCAP (healthcare-associated pneumonia) [J18.9] DISCHARGE DIAGNOSIS:  Active Problems:   Acute respiratory failure with hypoxia (HCC)  SECONDARY DIAGNOSIS:   Past Medical History:  Diagnosis Date  . Allergy   . Anxiety   . Anxiety   . Asthma   . Chronic diastolic CHF (congestive heart failure) (HCC)    a. echo 07/2013: EF 60-65%, DD, biatrial dilatation, Ao sclerosis, dilated RV, moderate pulmonary HTN, elevated CV and RA pressures; b. patient reported echo at Dr. Milta Deiters office 02/2015 - his office does not have record of him being a pt there c. echo 11/2015: EF 60-65%, Grade 1 DD, mod-severe pulm pressures  . Chronic respiratory failure (HCC)    a. on 2L via nasal cannula; b. secondary to COPD  . COPD (chronic obstructive pulmonary disease) (HCC)   . Depression   . Depression   . Depression   . Emphysema of lung (HCC)   . GERD (gastroesophageal reflux disease)   . Hypertension   . Personal history of tobacco use, presenting hazards to health 08/17/2015  . Tobacco abuse    HOSPITAL COURSE:   This is a 62 year old male admitted for acute respiratory failure.  1. Acute on chronic espiratory failure with hypoxia.  Off BiPAP, try to wean O2 as tolerated. NEB q6h. On home O2 [place] 2L.  2. Sepsis: The patient meets criteria via tachycardia, tachypnea and leukocytosis.  Discontinue broad-spectrum antibiotics, change to p.o. amoxicillin. Follow CBC, blood cultures is negative so far.  Sputum culture shows:ABUNDANT HAEMOPHILUS INFLUENZAE, BETA LACTAMASE NEGATIVE  3. Pneumonia: HCAP; continuecefepime and  azithromycin, changed to p.o. amoxicillin. 4. COPD: Continue inhaled corticosteroids and Spiriva. Albuterol and theophylline scheduled.  He needs pulmonary rehab.  Follow-up pulmonary physician Dr.Khan as outpatient.  Dehydration. Better.  Encourage oral fluid intake.  Generalized weakness.  PT evaluation suggest home health and PT. DISCHARGE CONDITIONS:  Stable, discharge to home with home health and PT, OT today. CONSULTS OBTAINED:  Treatment Team:  Yevonne Pax, MD DRUG ALLERGIES:   Allergies  Allergen Reactions  . Asa [Aspirin] Other (See Comments)    Reaction: swelling of the right side.  . Bee Venom Swelling  . Codeine Other (See Comments)    Reaction: Difficulty breathing  . Ibuprofen Other (See Comments)    Reaction: Swelling of the right side.   DISCHARGE MEDICATIONS:   Allergies as of 09/23/2017      Reactions   Asa [aspirin] Other (See Comments)   Reaction: swelling of the right side.   Bee Venom Swelling   Codeine Other (See Comments)   Reaction: Difficulty breathing   Ibuprofen Other (See Comments)   Reaction: Swelling of the right side.      Medication List    TAKE these medications   ALPRAZolam 1 MG tablet Commonly known as:  XANAX Take 1 tablet (1 mg total) by mouth 3 (three) times daily as needed for anxiety.   amoxicillin 500 MG capsule Commonly known as:  AMOXIL Take 1 capsule (500 mg total) by mouth every 8 (eight) hours.   DULERA 200-5 MCG/ACT Aero Generic drug:  mometasone-formoterol Inhale 1 puff into  the lungs 2 (two) times daily.   escitalopram 20 MG tablet Commonly known as:  LEXAPRO Take 20 mg by mouth at bedtime.   furosemide 20 MG tablet Commonly known as:  LASIX Take 20 mg by mouth daily.   gabapentin 600 MG tablet Commonly known as:  NEURONTIN TAKE 1 TABLET BY MOUTH THREE TIMES DAILY What changed:  Another medication with the same name was removed. Continue taking this medication, and follow the directions you see  here.   guaiFENesin 600 MG 12 hr tablet Commonly known as:  MUCINEX Take 1 tablet (600 mg total) by mouth 2 (two) times daily.   guaiFENesin-dextromethorphan 100-10 MG/5ML syrup Commonly known as:  ROBITUSSIN DM Take 5 mLs by mouth every 6 (six) hours as needed for cough.   ipratropium-albuterol 0.5-2.5 (3) MG/3ML Soln Commonly known as:  DUONEB Take 3 mLs by nebulization 4 (four) times daily as needed (for shortness of breath).   potassium chloride 10 MEQ tablet Commonly known as:  K-DUR,KLOR-CON Take 10 mEq by mouth daily.   PROAIR RESPICLICK 108 (90 Base) MCG/ACT Aepb Generic drug:  Albuterol Sulfate Inhale 2 puffs into the lungs every 4 (four) hours as needed (for shortness of breath). Reported on 12/10/2015   PROBIOTIC-10 Caps Take 1 capsule by mouth 2 (two) times daily.   rOPINIRole 0.25 MG tablet Commonly known as:  REQUIP TAKE 1 TABLET BY MOUTH TWICE DAILY FOR CRAMPS   SPIRIVA RESPIMAT 2.5 MCG/ACT Aers Generic drug:  Tiotropium Bromide Monohydrate Inhale 2 puffs into the lungs daily.   theophylline 400 MG 24 hr tablet Commonly known as:  UNIPHYL Take 400 mg by mouth daily.   Vitamin D 2000 units Caps Take 2,000 Units by mouth daily.        DISCHARGE INSTRUCTIONS:  See AVS.  If you experience worsening of your admission symptoms, develop shortness of breath, life threatening emergency, suicidal or homicidal thoughts you must seek medical attention immediately by calling 911 or calling your MD immediately  if symptoms less severe.  You Must read complete instructions/literature along with all the possible adverse reactions/side effects for all the Medicines you take and that have been prescribed to you. Take any new Medicines after you have completely understood and accpet all the possible adverse reactions/side effects.   Please note  You were cared for by a hospitalist during your hospital stay. If you have any questions about your discharge medications or  the care you received while you were in the hospital after you are discharged, you can call the unit and asked to speak with the hospitalist on call if the hospitalist that took care of you is not available. Once you are discharged, your primary care physician will handle any further medical issues. Please note that NO REFILLS for any discharge medications will be authorized once you are discharged, as it is imperative that you return to your primary care physician (or establish a relationship with a primary care physician if you do not have one) for your aftercare needs so that they can reassess your need for medications and monitor your lab values.    On the day of Discharge:  VITAL SIGNS:  Blood pressure 119/64, pulse 94, temperature 98.2 F (36.8 C), temperature source Axillary, resp. rate (!) 24, height 5\' 11"  (1.803 m), weight 224 lb 2 oz (101.7 kg), SpO2 98 %. PHYSICAL EXAMINATION:  GENERAL:  62 y.o.-year-old patient lying in the bed with no acute distress.  EYES: Pupils equal, round, reactive to light  and accommodation. No scleral icterus. Extraocular muscles intact.  HEENT: Head atraumatic, normocephalic. Oropharynx and nasopharynx clear.  NECK:  Supple, no jugular venous distention. No thyroid enlargement, no tenderness.  LUNGS: Normal breath sounds bilaterally, no wheezing, rales,rhonchi or crepitation. No use of accessory muscles of respiration.  CARDIOVASCULAR: S1, S2 normal. No murmurs, rubs, or gallops.  ABDOMEN: Soft, non-tender, non-distended. Bowel sounds present. No organomegaly or mass.  EXTREMITIES: No pedal edema, cyanosis, or clubbing.  NEUROLOGIC: Cranial nerves II through XII are intact. Muscle strength 4/5 in all extremities. Sensation intact. Gait not checked.  PSYCHIATRIC: The patient is alert and oriented x 3.  SKIN: No obvious rash, lesion, or ulcer.  DATA REVIEW:   CBC Recent Labs  Lab 09/22/17 0441  WBC 10.4  HGB 13.1  HCT 39.5*  PLT 200    Chemistries   Recent Labs  Lab 09/22/17 0441  NA 142  K 4.0  CL 100*  CO2 35*  GLUCOSE 99  BUN 25*  CREATININE 1.08  CALCIUM 8.9     Microbiology Results  Results for orders placed or performed during the hospital encounter of 09/18/17  Culture, expectorated sputum-assessment     Status: None   Collection Time: 09/19/17 12:27 AM  Result Value Ref Range Status   Specimen Description SPUTUM  Final   Special Requests NONE  Final   Sputum evaluation   Final    THIS SPECIMEN IS ACCEPTABLE FOR SPUTUM CULTURE Performed at [facility], [address] [place], [name] [address]    Report Status 09/19/2017 FINAL  Final  Culture, respiratory (NON-Expectorated)     Status: None   Collection Time: 09/19/17 12:27 AM  Result Value Ref Range Status   Specimen Description   Final    SPUTUM Performed at [facility], [address] [place], [name] [address]    Special Requests   Final    NONE Reflexed from [id number] Performed at [facility], [address] [place], [name] [address]    Gram Stain   Final    FEW WBC PRESENT, PREDOMINANTLY PMN RARE GRAM NEGATIVE RODS    Culture   Final    ABUNDANT HAEMOPHILUS INFLUENZAE BETA LACTAMASE NEGATIVE Performed at [facility], [address] [place], [name] [address]    Report Status 09/21/2017 FINAL  Final  Blood culture (routine x 2)     Status: None (Preliminary result)   Collection Time: 09/19/17  2:03 AM  Result Value Ref Range Status   Specimen Description BLOOD RESISTANT WRIST  Final   Special Requests   Final    BOTTLES DRAWN AEROBIC AND ANAEROBIC Blood Culture adequate volume   Culture   Final    NO GROWTH 4 DAYS Performed at [facility], [address] [place], [name] [address]    Report Status PENDING  Incomplete  Blood culture (routine x 2)     Status: None (Preliminary result)   Collection Time: 09/19/17  2:14 AM  Result Value Ref Range Status   Specimen Description BLOOD LEFT  WRIST  Final   Special Requests   Final    BOTTLES DRAWN AEROBIC AND ANAEROBIC Blood Culture adequate volume   Culture   Final    NO GROWTH 4 DAYS Performed at [facility], [address] [place], [name] [address]    Report Status PENDING  Incomplete  MRSA PCR Screening     Status: None   Collection Time: 09/19/17  4:56 AM  Result  Value Ref Range Status   MRSA by PCR NEGATIVE NEGATIVE Final    Comment:        The GeneXpert MRSA Assay (FDA approved for NASAL specimens only), is one component of a comprehensive MRSA colonization surveillance program. It is not intended to diagnose MRSA infection nor to guide or monitor treatment for MRSA infections. Performed at [facility], [address] [place], [place] [address]     RADIOLOGY:  No results found.   Management plans discussed with the patient, family and they are in agreement.  CODE STATUS: Full Code   TOTAL TIME TAKING CARE OF THIS PATIENT: 33 minutes.    Shaune Pollack M.D on 09/23/2017 at 12:53 PM  Between 7am to 6pm - Pager - [phone]  After 6pm go to [email or web address] - password [email or web address] [facility]  [facility] [place] [facility]  Office  [phone]  CC: Primary care physician; Lyndon Code, MD   Note: This dictation was prepared with Dragon dictation along with smaller phrase technology. Any transcriptional errors that result from this process are unintentional.

## 2017-09-24 LAB — CULTURE, BLOOD (ROUTINE X 2)
CULTURE: NO GROWTH
CULTURE: NO GROWTH
Special Requests: ADEQUATE
Special Requests: ADEQUATE

## 2017-09-24 NOTE — Progress Notes (Signed)
Discharge instructions along with home medications and follow up gone over with patient. He verbalized that he understood instructions. Prescriptions given to patient. IV removed. Pt being discharged home 2L 02, no distress noted. Otilio JeffersonMadelyn S Fenton, RN

## 2017-09-24 NOTE — Progress Notes (Signed)
Physical Therapy Treatment Patient Details Name: Dustin [name] MRN: [id number] DOB: [birth year] Today's Date: 09/24/2017    History of Present Illness presented to ER secondary to SOB; admitted with sepsis, respiratory failure with hypoxia related to HCAP.  Initially requiring BiPAP, now transitioned to 3L supplemental O2.    PT Comments    Patient continues to perform all mobility at distant sup/mod indep level with RW; good awareness of need for activity pacing, energy conservation.  Completes 5x sit/stand without assist device, UE support, 18 seconds (slightly below age-matched norms, but limited by endurance rather than LE strength, power or functional ability).  Does report optimal comfort/confidence with use of RW; has access to one at home.  No buckling, LOB or safety concerns with mobility; limited primarily by cardiopulmonary endurance. Did require supplemental O2 at 3L to maintain sats with exertion (87% with exertion, recovers >90% within 15-20 seconds of seated rest).  RNCM informed and aware. Given functional performance and ability, continue to recommend home with home services and transition to outpatient pulmonary rehab for education/strategies for long-term management of pulmonary disease.   Follow Up Recommendations  Home health PT(home health OT; transition to outpatient pulmonary rehab as appropriate)     Equipment Recommendations       Recommendations for Other Services       Precautions / Restrictions Precautions Precautions: Fall Restrictions Weight Bearing Restrictions: No    Mobility  Bed Mobility               General bed mobility comments: seated in recliner beginning/end of treatment session  Transfers Overall transfer level: Needs assistance Equipment used: Rolling walker (2 wheeled) Transfers: Sit to/from Stand Sit to Stand: Modified independent (Device/Increase time)            Ambulation/Gait Ambulation/Gait assistance: Modified  independent (Device/Increase time) Ambulation Distance (Feet): 75 Feet Assistive device: Rolling walker (2 wheeled)       General Gait Details: reciprocal stepping pattern wtih slow, but steady, cadence.  1-2 standing rest periods throughout distance due to SOB   Stairs            Wheelchair Mobility    Modified Rankin (Stroke Patients Only)       Balance Overall balance assessment: Needs assistance Sitting-balance support: No upper extremity supported;Feet supported Sitting balance-Leahy Scale: Good     Standing balance support: Bilateral upper extremity supported Standing balance-Leahy Scale: Good                              Cognition Arousal/Alertness: Awake/alert Behavior During Therapy: WFL for tasks assessed/performed Overall Cognitive Status: Within Functional Limits for tasks assessed                                        Exercises Other Exercises Other Exercises: 5x sit/stand, minimal assist from bilat UEs, 18 seconds-good LE strength/power, limited by functional endurance Other Exercises: Patient with good awareness of need for activity pacing, energy conservation; has RW for use in the home.    General Comments        Pertinent Vitals/Pain Pain Assessment: No/denies pain    Home Living                      Prior Function            PT Goals (  current goals can now be found in the care plan section) Acute Rehab PT Goals Patient Stated Goal: to return home PT Goal Formulation: With patient Time For Goal Achievement: 10/05/17 Potential to Achieve Goals: Good Progress towards PT goals: Progressing toward goals    Frequency    Min 2X/week      PT Plan Current plan remains appropriate    Co-evaluation              AM-PAC PT "6 Clicks" Daily Activity  Outcome Measure  Difficulty turning over in bed (including adjusting bedclothes, sheets and blankets)?: None Difficulty moving from lying  on back to sitting on the side of the bed? : None Difficulty sitting down on and standing up from a chair with arms (e.g., wheelchair, bedside commode, etc,.)?: None Help needed moving to and from a bed to chair (including a wheelchair)?: None Help needed walking in hospital room?: None Help needed climbing 3-5 steps with a railing? : A Little 6 Click Score: 23    End of Session Equipment Utilized During Treatment: Gait belt;Oxygen Activity Tolerance: Patient tolerated treatment well Patient left: in chair;with call bell/phone within reach Nurse Communication: Mobility status PT Visit Diagnosis: Difficulty in walking, not elsewhere classified (R26.2)     Time: [id number] PT Time Calculation (min) (ACUTE ONLY): 19 min  Charges:  $Gait Training: 8-22 mins                    G Codes:       Bethene Hankinson H. Manson PasseyBrown, PT, DPT, NCS 09/24/17, 3:10 PM [phone]

## 2017-09-24 NOTE — Discharge Summary (Signed)
[employer] [facility] [place] [facility]   PATIENT NAME: Dustin [name]    MR#:  [id number]  DATE OF BIRTH:  1956/01/09  DATE OF ADMISSION:  09/18/2017   ADMITTING PHYSICIAN: Arnaldo Natal, MD  DATE OF DISCHARGE: 09/24/2017 PRIMARY CARE PHYSICIAN: Lyndon Code, MD   ADMISSION DIAGNOSIS:  Tachycardia [R00.0] Hypoxia [R09.02] COPD exacerbation (HCC) [J44.1] HCAP (healthcare-associated pneumonia) [J18.9] DISCHARGE DIAGNOSIS:  Active Problems:   Acute respiratory failure with hypoxia (HCC)  SECONDARY DIAGNOSIS:   Past Medical History:  Diagnosis Date  . Allergy   . Anxiety   . Anxiety   . Asthma   . Chronic diastolic CHF (congestive heart failure) (HCC)    a. echo 07/2013: EF 60-65%, DD, biatrial dilatation, Ao sclerosis, dilated RV, moderate pulmonary HTN, elevated CV and RA pressures; b. patient reported echo at Dr. Milta Deiters office 02/2015 - his office does not have record of him being a pt there c. echo 11/2015: EF 60-65%, Grade 1 DD, mod-severe pulm pressures  . Chronic respiratory failure (HCC)    a. on 2L via nasal cannula; b. secondary to COPD  . COPD (chronic obstructive pulmonary disease) (HCC)   . Depression   . Depression   . Depression   . Emphysema of lung (HCC)   . GERD (gastroesophageal reflux disease)   . Hypertension   . Personal history of tobacco use, presenting hazards to health 08/17/2015  . Tobacco abuse    HOSPITAL COURSE:   This is a 62 year old male admitted for acute respiratory failure.  1. Acute on chronic espiratory failure with hypoxia.  Off BiPAP, weaned down O2 to baseline 2L. NEB q6h prn. On home O2 [place] 2L.  2. Sepsis: The patient meets criteria via tachycardia, tachypnea and leukocytosis.  Discontinue broad-spectrum antibiotics, change to p.o. amoxicillin. Follow CBC, blood cultures is negative so far.  Sputum culture shows:ABUNDANT HAEMOPHILUS INFLUENZAE, BETA LACTAMASE NEGATIVE  3. Pneumonia: HCAP; he was treated  withcefepime and azithromycin, changed to p.o. amoxicillin. 4. COPD: Continue inhaled corticosteroids and Spiriva. Albuterol and theophylline scheduled.  He needs pulmonary rehab.  Follow-up pulmonary physician Dr.Khan as outpatient.  Dehydration. Better.  Encourage oral fluid intake.  Generalized weakness.    The patient and family member has concerns about weakness and want PT reevaluation. PT evaluation still suggests home health and PT. DISCHARGE CONDITIONS:  Stable, discharge to home with home health and PT, OT today. CONSULTS OBTAINED:  Treatment Team:  Yevonne Pax, MD DRUG ALLERGIES:   Allergies  Allergen Reactions  . Asa [Aspirin] Other (See Comments)    Reaction: swelling of the right side.  . Bee Venom Swelling  . Codeine Other (See Comments)    Reaction: Difficulty breathing  . Ibuprofen Other (See Comments)    Reaction: Swelling of the right side.   DISCHARGE MEDICATIONS:   Allergies as of 09/24/2017      Reactions   Asa [aspirin] Other (See Comments)   Reaction: swelling of the right side.   Bee Venom Swelling   Codeine Other (See Comments)   Reaction: Difficulty breathing   Ibuprofen Other (See Comments)   Reaction: Swelling of the right side.      Medication List    TAKE these medications   ALPRAZolam 1 MG tablet Commonly known as:  XANAX Take 1 tablet (1 mg total) by mouth 3 (three) times daily as needed for anxiety.   amoxicillin 500 MG capsule Commonly known as:  AMOXIL Take 1 capsule (500 mg total)  by mouth every 8 (eight) hours.   DULERA 200-5 MCG/ACT Aero Generic drug:  mometasone-formoterol Inhale 1 puff into the lungs 2 (two) times daily.   escitalopram 20 MG tablet Commonly known as:  LEXAPRO Take 20 mg by mouth at bedtime.   furosemide 20 MG tablet Commonly known as:  LASIX Take 20 mg by mouth daily.   gabapentin 600 MG tablet Commonly known as:  NEURONTIN TAKE 1 TABLET BY MOUTH THREE TIMES DAILY What changed:  Another  medication with the same name was removed. Continue taking this medication, and follow the directions you see here.   guaiFENesin 600 MG 12 hr tablet Commonly known as:  MUCINEX Take 1 tablet (600 mg total) by mouth 2 (two) times daily.   guaiFENesin-dextromethorphan 100-10 MG/5ML syrup Commonly known as:  ROBITUSSIN DM Take 5 mLs by mouth every 6 (six) hours as needed for cough.   ipratropium-albuterol 0.5-2.5 (3) MG/3ML Soln Commonly known as:  DUONEB Take 3 mLs by nebulization 4 (four) times daily as needed (for shortness of breath).   potassium chloride 10 MEQ tablet Commonly known as:  K-DUR,KLOR-CON Take 10 mEq by mouth daily.   PROAIR RESPICLICK 108 (90 Base) MCG/ACT Aepb Generic drug:  Albuterol Sulfate Inhale 2 puffs into the lungs every 4 (four) hours as needed (for shortness of breath). Reported on 12/10/2015   PROBIOTIC-10 Caps Take 1 capsule by mouth 2 (two) times daily.   rOPINIRole 0.25 MG tablet Commonly known as:  REQUIP TAKE 1 TABLET BY MOUTH TWICE DAILY FOR CRAMPS   SPIRIVA RESPIMAT 2.5 MCG/ACT Aers Generic drug:  Tiotropium Bromide Monohydrate Inhale 2 puffs into the lungs daily.   theophylline 400 MG 24 hr tablet Commonly known as:  UNIPHYL Take 400 mg by mouth daily.   Vitamin D 2000 units Caps Take 2,000 Units by mouth daily.        DISCHARGE INSTRUCTIONS:  See AVS.  If you experience worsening of your admission symptoms, develop shortness of breath, life threatening emergency, suicidal or homicidal thoughts you must seek medical attention immediately by calling 911 or calling your MD immediately  if symptoms less severe.  You Must read complete instructions/literature along with all the possible adverse reactions/side effects for all the Medicines you take and that have been prescribed to you. Take any new Medicines after you have completely understood and accpet all the possible adverse reactions/side effects.   Please note  You were cared  for by a hospitalist during your hospital stay. If you have any questions about your discharge medications or the care you received while you were in the hospital after you are discharged, you can call the unit and asked to speak with the hospitalist on call if the hospitalist that took care of you is not available. Once you are discharged, your primary care physician will handle any further medical issues. Please note that NO REFILLS for any discharge medications will be authorized once you are discharged, as it is imperative that you return to your primary care physician (or establish a relationship with a primary care physician if you do not have one) for your aftercare needs so that they can reassess your need for medications and monitor your lab values.    On the day of Discharge:  VITAL SIGNS:  Blood pressure 105/67, pulse 96, temperature 98.3 F (36.8 C), temperature source Axillary, resp. rate 18, height 5\' 11"  (1.803 m), weight 224 lb 9.6 oz (101.9 kg), SpO2 95 %. PHYSICAL EXAMINATION:  GENERAL:  62 y.o.-year-old patient lying in the bed with no acute distress.  Obesity. EYES: Pupils equal, round, reactive to light and accommodation. No scleral icterus. Extraocular muscles intact.  HEENT: Head atraumatic, normocephalic. Oropharynx and nasopharynx clear.  NECK:  Supple, no jugular venous distention. No thyroid enlargement, no tenderness.  LUNGS: Normal breath sounds bilaterally, no wheezing, rales,rhonchi or crepitation. No use of accessory muscles of respiration.  CARDIOVASCULAR: S1, S2 normal. No murmurs, rubs, or gallops.  ABDOMEN: Soft, non-tender, non-distended. Bowel sounds present. No organomegaly or mass.  EXTREMITIES: No pedal edema, cyanosis, or clubbing.  NEUROLOGIC: Cranial nerves II through XII are intact. Muscle strength 4/5 in all extremities. Sensation intact. Gait not checked.  PSYCHIATRIC: The patient is alert and oriented x 3.  SKIN: No obvious rash, lesion, or ulcer.    DATA REVIEW:   CBC Recent Labs  Lab 09/22/17 0441  WBC 10.4  HGB 13.1  HCT 39.5*  PLT 200    Chemistries  Recent Labs  Lab 09/22/17 0441  NA 142  K 4.0  CL 100*  CO2 35*  GLUCOSE 99  BUN 25*  CREATININE 1.08  CALCIUM 8.9     Microbiology Results  Results for orders placed or performed during the hospital encounter of 09/18/17  Culture, expectorated sputum-assessment     Status: None   Collection Time: 09/19/17 12:27 AM  Result Value Ref Range Status   Specimen Description SPUTUM  Final   Special Requests NONE  Final   Sputum evaluation   Final    THIS SPECIMEN IS ACCEPTABLE FOR SPUTUM CULTURE Performed at [facility], [address], [place], [place] [address]    Report Status 09/19/2017 FINAL  Final  Culture, respiratory (NON-Expectorated)     Status: None   Collection Time: 09/19/17 12:27 AM  Result Value Ref Range Status   Specimen Description   Final    SPUTUM Performed at [facility], [address] [place], [place] [address]    Special Requests   Final    NONE Reflexed from [id number] Performed at [facility], [address] [place], [place] [address]    Gram Stain   Final    FEW WBC PRESENT, PREDOMINANTLY PMN RARE GRAM NEGATIVE RODS    Culture   Final    ABUNDANT HAEMOPHILUS INFLUENZAE BETA LACTAMASE NEGATIVE Performed at [facility], [address] [place], [place] [address]    Report Status 09/21/2017 FINAL  Final  Blood culture (routine x 2)     Status: None   Collection Time: 09/19/17  2:03 AM  Result Value Ref Range Status   Specimen Description BLOOD RESISTANT WRIST  Final   Special Requests   Final    BOTTLES DRAWN AEROBIC AND ANAEROBIC Blood Culture adequate volume   Culture   Final    NO GROWTH 5 DAYS Performed at [facility], [address] [place], [place] [address]    Report Status 09/24/2017 FINAL  Final  Blood culture (routine x 2)     Status: None   Collection Time:  09/19/17  2:14 AM  Result Value Ref Range Status   Specimen Description BLOOD LEFT WRIST  Final   Special Requests   Final    BOTTLES DRAWN AEROBIC AND ANAEROBIC Blood Culture adequate volume   Culture   Final    NO GROWTH 5 DAYS Performed at [facility], [address] [place], [place] [address]    Report Status 09/24/2017 FINAL  Final  MRSA PCR Screening     Status: None   Collection Time: 09/19/17  4:56 AM  Result Value Ref Range Status   MRSA by PCR NEGATIVE NEGATIVE Final    Comment:        The GeneXpert MRSA Assay (FDA approved for NASAL specimens only), is one component of a comprehensive MRSA colonization surveillance program. It is not intended to diagnose MRSA infection nor to guide or monitor treatment for MRSA infections. Performed at [facility], [address] [place], [place] [address]     RADIOLOGY:  No results found.   Management plans discussed with the patient, family and they are in agreement.  CODE STATUS: Full Code   TOTAL TIME TAKING CARE OF THIS PATIENT: 33 minutes.    Shaune Pollack M.D on 09/24/2017 at 3:44 PM  Between 7am to 6pm - Pager - [phone]  After 6pm go to [email or web address] - password [email or web address] [facility]  [facility]  Office  [phone]  CC: Primary care physician; Lyndon Code, MD   Note: This dictation was prepared with Dragon dictation along with smaller phrase technology. Any transcriptional errors that result from this process are unintentional.

## 2017-09-25 ENCOUNTER — Telehealth: Payer: Self-pay

## 2017-09-25 ENCOUNTER — Other Ambulatory Visit: Payer: Self-pay | Admitting: *Deleted

## 2017-09-25 DIAGNOSIS — J9611 Chronic respiratory failure with hypoxia: Secondary | ICD-10-CM | POA: Diagnosis not present

## 2017-09-25 DIAGNOSIS — J189 Pneumonia, unspecified organism: Secondary | ICD-10-CM | POA: Diagnosis not present

## 2017-09-25 DIAGNOSIS — J441 Chronic obstructive pulmonary disease with (acute) exacerbation: Secondary | ICD-10-CM | POA: Diagnosis not present

## 2017-09-25 DIAGNOSIS — I5032 Chronic diastolic (congestive) heart failure: Secondary | ICD-10-CM | POA: Diagnosis not present

## 2017-09-25 DIAGNOSIS — J45909 Unspecified asthma, uncomplicated: Secondary | ICD-10-CM | POA: Diagnosis not present

## 2017-09-25 DIAGNOSIS — Z9981 Dependence on supplemental oxygen: Secondary | ICD-10-CM | POA: Diagnosis not present

## 2017-09-25 DIAGNOSIS — Z720 Tobacco use: Secondary | ICD-10-CM | POA: Diagnosis not present

## 2017-09-25 DIAGNOSIS — I110 Hypertensive heart disease with heart failure: Secondary | ICD-10-CM | POA: Diagnosis not present

## 2017-09-25 DIAGNOSIS — Z7951 Long term (current) use of inhaled steroids: Secondary | ICD-10-CM | POA: Diagnosis not present

## 2017-09-25 DIAGNOSIS — J440 Chronic obstructive pulmonary disease with acute lower respiratory infection: Secondary | ICD-10-CM | POA: Diagnosis not present

## 2017-09-25 NOTE — Telephone Encounter (Signed)
[facility] CALLED FOR VERBAL ORDER FOR HOME HEALTH AIDE AND I GAVE VERBAL ORDER AND DR Murlean IbaKHAN AWARE

## 2017-09-25 NOTE — Patient Outreach (Signed)
Successful telephone encounter to Dustin [name], [age] year of male, [facility] lia[facility]/20/16 for transition of care.  Recent hospitalization January 1-7,2019 for Tachycardia, Hypoxia, COPD exacerbation, Healthcare-associated  Pneumonia.   Pt's history includes but not limited to CHF, Pulmonary Hypertension, Anxiety.  Spoke with pt, HIPAA identifiers verified with pt providing a different address, reports  discharged to son's home -[address], [place], [name] [id number].  Discussed with pt purpose of call, follow up on referral/recent hospital discharge.    Pt reports back at his son's home in [place], daughter in law assisting as needed, to follow up with social services about housing, would like to move back to [place].  Pt report HH RN came today Open case, to get PT and OT.  Pt reports has a little sob, on continuous 2 Liters [place], anxiety   Effects his breathing,has issues with incontinence at times, been on Xanax a long time,not helping. Pt reports this is the fourth time he was in the hospital since October.    Pt reports taking all of his medications,* Patient was recently discharged from hospital and all medications have been reviewed. Pt reports to follow up with PCP (Dr. Beverely RisenFozia Khan) 1/22 and Lung MD (Dr. Freda MunroSaadat Khan) 1/31, to [facility]  [facility] about [facility] or daughter in law can take him to MD appointments.      Discussed with pt this RN CM does not cover [place] area, plan to refer to RN CM who does, in the  Meantime provided pt with RN CM's contact number to call if needed along with [facility]'s main office number and 24 hour nurse line.  Pt reports he had [facility] services in the past, Helmut MusterAlicia RN CM and [employer]Scott [name]  Helped him a lot.    Plan:  Plan to inform  [facility] CMA to refer pt to  RN CM covering the [place] area for ongoing transition of care.            Plan to inform Dr. Welton FlakesKhan of [facility] involvement, send Barrier letter.    Shayne Alkenose M.   Borden Thune RN  CCM [facility] Care Management  [phone]

## 2017-09-26 ENCOUNTER — Encounter: Payer: Self-pay | Admitting: *Deleted

## 2017-09-26 ENCOUNTER — Other Ambulatory Visit: Payer: Self-pay | Admitting: *Deleted

## 2017-09-26 ENCOUNTER — Telehealth: Payer: Self-pay | Admitting: Pharmacist

## 2017-09-26 DIAGNOSIS — Z7951 Long term (current) use of inhaled steroids: Secondary | ICD-10-CM | POA: Diagnosis not present

## 2017-09-26 DIAGNOSIS — J189 Pneumonia, unspecified organism: Secondary | ICD-10-CM | POA: Diagnosis not present

## 2017-09-26 DIAGNOSIS — J9611 Chronic respiratory failure with hypoxia: Secondary | ICD-10-CM | POA: Diagnosis not present

## 2017-09-26 DIAGNOSIS — I5032 Chronic diastolic (congestive) heart failure: Secondary | ICD-10-CM | POA: Diagnosis not present

## 2017-09-26 DIAGNOSIS — J440 Chronic obstructive pulmonary disease with acute lower respiratory infection: Secondary | ICD-10-CM | POA: Diagnosis not present

## 2017-09-26 DIAGNOSIS — I110 Hypertensive heart disease with heart failure: Secondary | ICD-10-CM | POA: Diagnosis not present

## 2017-09-26 DIAGNOSIS — Z720 Tobacco use: Secondary | ICD-10-CM | POA: Diagnosis not present

## 2017-09-26 DIAGNOSIS — J441 Chronic obstructive pulmonary disease with (acute) exacerbation: Secondary | ICD-10-CM | POA: Diagnosis not present

## 2017-09-26 DIAGNOSIS — Z9981 Dependence on supplemental oxygen: Secondary | ICD-10-CM | POA: Diagnosis not present

## 2017-09-26 DIAGNOSIS — J45909 Unspecified asthma, uncomplicated: Secondary | ICD-10-CM | POA: Diagnosis not present

## 2017-09-26 NOTE — Patient Outreach (Signed)
[name]) Care Management  09/26/2017  Dustin [name] 1956/06/11 [id number]   Outreach to Dustin [name] today for transition of care follow up and assessment. He has spoken with Dustin Mourning RN, [name] Care Management on initial contact. I will be assuming Dustin [name] case as he is currently living in [place], [place] with his son and daughter in law. I am familiar with Dustin [name] from previous engagements for community case management.   Dustin [name] is a 62 year old gentleman with past medical history which includes COPD (Gold IV Classificaton), asthma, hypertension, anxiety and depression, and GERD who has had 3 inpatient hospital admissions in the last 6 months, all for respiratory ailments. Most recently, Dustin [name] was admitted to [facility] from 09/18/17 - 09/24/17 with acute on chronic respiratory failure with hypoxia, sepsis (w/ abundant h-Flu, Beta Lactamase negative), and pneumonia.  Chronic Pulmonary Illness - as noted, Dustin [name] has had 3 admissions for respiratory/pulmonary illness over the last 6 months. Today, when we spoke he said he felt "a little better and moving in the right direction." He is using O2 @2l /[place] continuously at home. We reviewed Dustin [name] discharge instructions and plan of care including short and long term management of pulmonary disease and signs and symptoms for which Dustin [name] should contact his provider vs when he should call 911.  Home Health Follow Up - Dustin [name] confirmed that he was visited by the home health nurse and physical therapist from [facility]. He anticipates a visit from the occupational therapist later this week.   Medication Management - He has and is taking all prescribed medication and is very knowledgeable about his medicines. The cost of his inhalers are always a concern. He said he ended up in the donut hole by May of last year and anticipates the same problem this year. I reminded  him that our pharmacy team can help him navigate medication management. Our [facility] Pharmacist Dustin [name] is following Dustin [name] and I will collaborate with her re: this part of Dustin [name] care and needs.   Provider Follow Up - Dustin [name] had a post hospital follow up appointment scheduled for 10/09/17 with Dustin [name] but was called by the PCP office today and given a later date of 10/17/17. He also has a previously scheduled appointment on 10/18/17 with Dustin [name], Pulmonologist (same office building). Given that Dustin [name] has had frequent back to back admissions for pulmonary illness and was discharged from the hospital on 09/24/17, I will advocate for Dustin [name] to have an appointment with a provider in sooner than 23 days. I will place a call to his pulmonary doctor first to see if we can get an appointment within the next 7 days or sooner if possible. If not, I will try to get an appointment with the PCP in that time frame.   Transportation Concerns - Dustin [name] has a car and can drive but would like assistance with getting rides to doctor's appointments because he anticipates that it will be taxing in his current deconditioned state. His son and daughter in law work during the day and cannot easily take time off to take him to the doctor. I will discuss this need with our social work team. Dustin [name] may also have a transportation benefit through his [facility] insurance. I will reach out on his behalf to inquire.   Housing Concerns - Between his 1st and 2nd most recent hospitalizations, [name] and son-in-law. However, they both travel for work and Dustin [name] says today that he found himself to be quite isolated. Even thought he can drive, he found himself "driving around endlessly just [facility] to [facility] He felt it would be best for him to return to the area he knows best and where his physician team knows him well. He has been  living with his son, daughter in law, and 2 small grand-daughters in a small 2 bedroom home in [place]. He reports that his son/daughter in law are taking excellent care of him and have offered for him to stay indefinitely. However, his "room" is the living room and the quarters are quite cramped. The grand-children are school aged and therefore exposed to sick children at school through the winter months and he is concerned about imposing and about exposure to illness. He would like to find a small apartment. I offered to put him in touch with our social work team and gave him basic information about subsidized housing opportunities in the [place] area.   Plan:  I will collaborate with my teammates (pharmacy/social work) regarding Dustin [name]'s needs.   I will reach out to the PCP/Pulmonary office re: rescheduling appointments.   I will reach out to the [facility] representative re: Dustin [name]'s transportation needs.   Dustin [name] will take his medications as prescribed and will work with the home health team, [facility] Social Worker, and [facility] Pharmacist.   Dustin [name] will see his providers as scheduled.   [facility] CM Care Plan Problem One     Most Recent Value  Care Plan Problem One  Risk for readmisson related to recent hospitalization for Tachycardia, COPD, Pneumonia,   Role Documenting the Problem One  Care Management Coordinator  Care Plan for Problem One  Active  [facility] Long Term Goal   Pt would not readmit to the hospital within the next 31 days   [facility] Long Term Goal Start Date  09/25/17  Interventions for Problem One Long Term Goal  reviewed plan of care, discharge instructions, home health strategy, consults with social work and pharmacy  [facility] CM Short Term Goal #1   Pt would keep all MD appointments in the next 30 days   [facility] CM Short Term Goal #1 Start Date  09/25/17  Interventions for Short Term Goal #1  reviewed scheduled provider appointments,  outreach to PCP/pulmonary office to reschedule     [facility] CM Care Plan Problem Two     Most Recent Value  Care Plan Problem Two  Medication Management Concerns secondary to Financial Constraints  Role Documenting the Problem Two  Care Management Coordinator  Care Plan for Problem Two  Active  Interventions for Problem Two Long Term Goal   Medications reviewed,  pharmacy consult confirmed,  pharmacy collaboration  [facility] Long Term Goal  Over the next 31 days, patient will verbalize understanding of plan to address barriers around medication procurement and management  [facility] Long Term Goal Start Date  09/26/17  [facility] CM Short Term Goal #1   Over the next 30 dayts, patient will work with [facility] Pharmacist re: Medication Management needs  [facility] CM Short Term Goal #1 Start Date  09/26/17  Interventions for Short Term Goal #2   pharmacy consult confirmed,  collaboration with pharmacist,  mini-financial assessment performed    [facility] CM Care Plan Problem Three     Most Recent Value  Care Plan Problem  Three  Community Resource Needs related to housing and transportation  Role Documenting the Problem Three  Care Management Coordinator  Care Plan for Problem Three  Active  [facility] Long Term Goal   Over the next 31 days, patient will verbalize understanding of plan to address housing and transportation resources  [facility] Long Term Goal Start Date  09/26/17  Interventions for Problem Three Long Term Goal  discussed patient housing and transportation needs,  social work consult placed  [facility] CM Short Term Goal #1   Over the next 30 days, patient will make decision about long term housing options, verbalizing understanding of options in his community   [facility] CM Short Term Goal #1 Start Date  09/26/17  Interventions for Short Term Goal #1  discussed known housing rescources in the desired area,  advised pt to work closely with social work team re: housing needs  [employer] CM Short Term Goal #2   Over the next 30 days, patient will verbalize understanding of transportation options including  insurance benefit and community agency/resources  [facility] CM Short Term Goal #2 Start Date  09/26/17  Interventions for Short Term Goal #2  reviewed known community transportation resources,  social work consult made      Marja Kays [facility] Care Management  [phone]

## 2017-09-26 NOTE — Addendum Note (Signed)
Addended by: Nunzio CoryGILBOY, ALISA G on: 09/26/2017 08:24 PM   Modules accepted: Orders

## 2017-09-26 NOTE — Patient Outreach (Deleted)
[name](THN) Care Management  [facility]   09/26/2017  Dustin CoriaDennis R Thal 1956/09/08 [id number]   Provide scale + spacer  Subjective:   Objective:   Encounter Medications: Outpatient Encounter Medications as of 09/26/2017  Medication Sig  . Albuterol Sulfate (PROAIR RESPICLICK) 108 (90 BASE) MCG/ACT AEPB Inhale 2 puffs into the lungs every 4 (four) hours as needed (for shortness of breath). Reported on 12/10/2015  . ALPRAZolam (XANAX) 1 MG tablet Take 1 tablet (1 mg total) by mouth 3 (three) times daily as needed for anxiety.  Marland Kitchen. amoxicillin (AMOXIL) 500 MG capsule Take 1 capsule (500 mg total) by mouth every 8 (eight) hours.  . Cholecalciferol (VITAMIN D) 2000 units CAPS Take 2,000 Units by mouth daily.  Marland Kitchen. escitalopram (LEXAPRO) 20 MG tablet Take 20 mg by mouth at bedtime.   . furosemide (LASIX) 20 MG tablet Take 20 mg by mouth daily.   Marland Kitchen. gabapentin (NEURONTIN) 600 MG tablet TAKE 1 TABLET BY MOUTH THREE TIMES DAILY  . guaiFENesin (MUCINEX) 600 MG 12 hr tablet Take 1 tablet (600 mg total) by mouth 2 (two) times daily.  Marland Kitchen. guaiFENesin-dextromethorphan (ROBITUSSIN DM) 100-10 MG/5ML syrup Take 5 mLs by mouth every 6 (six) hours as needed for cough.  Marland Kitchen. ipratropium-albuterol (DUONEB) 0.5-2.5 (3) MG/3ML SOLN Take 3 mLs by nebulization 4 (four) times daily as needed (for shortness of breath).  . mometasone-formoterol (DULERA) 200-5 MCG/ACT AERO Inhale 1 puff into the lungs 2 (two) times daily.   . potassium chloride (K-DUR,KLOR-CON) 10 MEQ tablet Take 10 mEq by mouth daily.   . Probiotic Product (PROBIOTIC-10) CAPS Take 1 capsule by mouth 2 (two) times daily.   Marland Kitchen. rOPINIRole (REQUIP) 0.25 MG tablet TAKE 1 TABLET BY MOUTH TWICE DAILY FOR CRAMPS  . theophylline (UNIPHYL) 400 MG 24 hr tablet Take 400 mg by mouth daily.   . Tiotropium Bromide Monohydrate (SPIRIVA RESPIMAT) 2.5 MCG/ACT AERS Inhale 2 puffs into the lungs daily.    No facility-administered encounter medications on  file as of 09/26/2017.     Functional Status: In your present state of health, do you have any difficulty performing the following activities: 09/19/2017 08/06/2017  Hearing? N N  Vision? N N  Difficulty concentrating or making decisions? N N  Walking or climbing stairs? Y N  Dressing or bathing? Y N  Doing errands, shopping? N N  Some recent data might be hidden    Fall/Depression Screening: Fall Risk  04/12/2016 03/29/2016 03/02/2016  Falls in the past year? Yes Yes Yes  Number falls in past yr: 1 1 1   Injury with Fall? No No No  Risk for fall due to : History of fall(s);Impaired balance/gait;Impaired mobility;Medication side effect History of fall(s);Impaired balance/gait;Medication side effect History of fall(s);Impaired balance/gait;Medication side effect  Risk for fall due to: Comment - - -  Follow up Falls prevention discussed Falls prevention discussed Falls prevention discussed   PHQ 2/9 Scores 04/12/2016 03/29/2016 03/02/2016 02/02/2016 12/13/2015  PHQ - 2 Score 2 2 2 2 2   PHQ- 9 Score 9 7 8 8 8       Assessment:  Plan:

## 2017-09-27 ENCOUNTER — Other Ambulatory Visit: Payer: Self-pay

## 2017-09-27 DIAGNOSIS — I5032 Chronic diastolic (congestive) heart failure: Secondary | ICD-10-CM | POA: Diagnosis not present

## 2017-09-27 DIAGNOSIS — J45909 Unspecified asthma, uncomplicated: Secondary | ICD-10-CM | POA: Diagnosis not present

## 2017-09-27 DIAGNOSIS — J440 Chronic obstructive pulmonary disease with acute lower respiratory infection: Secondary | ICD-10-CM | POA: Diagnosis not present

## 2017-09-27 DIAGNOSIS — Z720 Tobacco use: Secondary | ICD-10-CM | POA: Diagnosis not present

## 2017-09-27 DIAGNOSIS — J441 Chronic obstructive pulmonary disease with (acute) exacerbation: Secondary | ICD-10-CM | POA: Diagnosis not present

## 2017-09-27 DIAGNOSIS — Z7951 Long term (current) use of inhaled steroids: Secondary | ICD-10-CM | POA: Diagnosis not present

## 2017-09-27 DIAGNOSIS — J189 Pneumonia, unspecified organism: Secondary | ICD-10-CM | POA: Diagnosis not present

## 2017-09-27 DIAGNOSIS — I110 Hypertensive heart disease with heart failure: Secondary | ICD-10-CM | POA: Diagnosis not present

## 2017-09-27 DIAGNOSIS — Z9981 Dependence on supplemental oxygen: Secondary | ICD-10-CM | POA: Diagnosis not present

## 2017-09-27 DIAGNOSIS — J9611 Chronic respiratory failure with hypoxia: Secondary | ICD-10-CM | POA: Diagnosis not present

## 2017-09-27 MED ORDER — OPTICHAMBER ADVANTAGE-LG MASK MISC: 1 refills | Status: DC

## 2017-09-27 NOTE — Patient Outreach (Addendum)
[name](THN) Care Management  [facility]   09/26/2017  Hershal CoriaDennis R Canedo Oct 12, 1955 [id number]   62 year old [facility] referred for post-discharge transition of care services.  [facility] services requested for medication reconciliation and patient assistance with inhalers.  PMHx includes, but not limited to, diastolic heart failure (EF = 60-65% 3/'17 ), HTN, pulmonary HTN, GERD, anxiety, depression, tobacco abuse with 64.5 pack-year history, asthma and COPD on 2L 02 at baseline with 3 hospitalizations since October 2018 related to COPD exacerbations.  Patient most recently admitted 09/18/2017 -09/24/2017 for AECOPD and HCAP.  Patient discharged home to complete 6 days of oral antibiotics and has home health RN, PT and OT ordered.     Per notes, patient recently living with [place] with his daughter but now has moved back to [place] with his son and is interested in housing resource information.    Subjective: Successful telephone encounter with [name] today.  HIPAA identifiers verified. [name] is familiar with [facility] as he has worked with [facility] pharmac[facility] in the past for patient assistance with inhalers.    Patient reports he is not sure why he keeps being hospitalized.  He reports "maybe I overdid it" after admission in December.  He reports he has stopped smoking cigarettes completely.  He states this he is living with his son and daughter in law's house in [place] with their 3 children in a 3 bedroom house.  He is sleeping on the couch and feels as though he is a burden on them.  He is interested in housing resources in the [place] area.  He reports that his sister is an Charity fundraiserN and helps him with his medications.  Patient reports that he checks his weights "most days" but has trouble reading his scale.  He reports that he received his inhalers, Spiriva and Dulera, at no charge from the manufacturers in 2018 and that he has  submitted applications for 2019 to Dr. Milta DeitersKhan's office for completion.  He reports that he picked up prescription for amoxicillin and is taking as scheduled x 6 days.   Objective:  SCr = 1.08 (09/22/17), CrCl = 100 ml/min (weight = 102.6kg)   Encounter Medications: Outpatient Encounter Medications as of 09/26/2017  Medication Sig  . Albuterol Sulfate (PROAIR RESPICLICK) 108 (90 BASE) MCG/ACT AEPB Inhale 2 puffs into the lungs every 4 (four) hours as needed (for shortness of breath). Reported on 12/10/2015  . ALPRAZolam (XANAX) 1 MG tablet Take 1 tablet (1 mg total) by mouth 3 (three) times daily as needed for anxiety.  Marland Kitchen. amoxicillin (AMOXIL) 500 MG capsule Take 1 capsule (500 mg total) by mouth every 8 (eight) hours.  . Cholecalciferol (VITAMIN D) 2000 units CAPS Take 2,000 Units by mouth daily.  Marland Kitchen. escitalopram (LEXAPRO) 20 MG tablet Take 20 mg by mouth at bedtime.   . furosemide (LASIX) 20 MG tablet Take 20 mg by mouth daily.   Marland Kitchen. gabapentin (NEURONTIN) 600 MG tablet TAKE 1 TABLET BY MOUTH THREE TIMES DAILY  . guaiFENesin (MUCINEX) 600 MG 12 hr tablet Take 1 tablet (600 mg total) by mouth 2 (two) times daily.  Marland Kitchen. guaiFENesin-dextromethorphan (ROBITUSSIN DM) 100-10 MG/5ML syrup Take 5 mLs by mouth every 6 (six) hours as needed for cough.  Marland Kitchen. ipratropium-albuterol (DUONEB) 0.5-2.5 (3) MG/3ML SOLN Take 3 mLs by nebulization 4 (four) times daily as needed (for shortness of breath).  . mometasone-formoterol (DULERA) 200-5 MCG/ACT AERO Inhale 1 puff  into the lungs 2 (two) times daily.   . potassium chloride (K-DUR,KLOR-CON) 10 MEQ tablet Take 10 mEq by mouth daily.   . Probiotic Product (PROBIOTIC-10) CAPS Take 1 capsule by mouth 2 (two) times daily.   Marland Kitchen rOPINIRole (REQUIP) 0.25 MG tablet TAKE 1 TABLET BY MOUTH TWICE DAILY FOR CRAMPS  . theophylline (UNIPHYL) 400 MG 24 hr tablet Take 400 mg by mouth daily.   . Tiotropium Bromide Monohydrate (SPIRIVA RESPIMAT) 2.5 MCG/ACT AERS Inhale 2 puffs into the lungs  daily.    No facility-administered encounter medications on file as of 09/26/2017.     Functional Status: In your present state of health, do you have any difficulty performing the following activities: 09/19/2017 08/06/2017  Hearing? N N  Vision? N N  Difficulty concentrating or making decisions? N N  Walking or climbing stairs? Y N  Dressing or bathing? Y N  Doing errands, shopping? N N  Some recent data might be hidden    Fall/Depression Screening: Fall Risk  04/12/2016 03/29/2016 03/02/2016  Falls in the past year? Yes Yes Yes  Number falls in past yr: 1 1 1   Injury with Fall? No No No  Risk for fall due to : History of fall(s);Impaired balance/gait;Impaired mobility;Medication side effect History of fall(s);Impaired balance/gait;Medication side effect History of fall(s);Impaired balance/gait;Medication side effect  Risk for fall due to: Comment - - -  Follow up Falls prevention discussed Falls prevention discussed Falls prevention discussed   PHQ 2/9 Scores 04/12/2016 03/29/2016 03/02/2016 02/02/2016 12/13/2015  PHQ - 2 Score 2 2 2 2 2   PHQ- 9 Score 9 7 8 8 8     ASSESSMENT: Date Discharged from Hospital: 09/24/2017 Date Medication Reconciliation Performed: 09/27/2017  Medications Discontinued at Discharge:   None  New Medications at Discharge:   Amoxicillin  Robitussin DM  Patient was recently discharged from hospital and all medications have been reviewed  Drugs sorted by system:  Neurologic/Psychologic: Alprazolam, escitalopram, gabapentin, ropinirole  Cardiovascular: Furosemide  Pulmonary/Allergy: Albuterol inhaler, ipratropium-albuterol nebulizers, mometasone-formoterol inhaler, tiotropium inhaler, guaifenesin, guaifenesin-dextromethorphan, theophylline  Gastrointestinal:Probiotic  Endocrine: None  Renal: None  Topical: None  Pain: None  Vitamins/Minerals:Cholecalciferol, potassium  Infectious Diseases: Amoxicillin  Miscellaneous: None  Duplications in  therapy:   Guaifenesin in Mucinex and Robitussin DM.  Patient reports he is not taking medications together and that he does not feel Robitussin DM is helping, plans to stop Robitussin DM.  Albuterol inhaler and nebulizer: Patient aware that albuterol in both products, only uses one or the other PRN   Gaps in therapy: Heart Failure documented, EF > 40%, no BB, ACEI on board possibly 2/2 low blood pressure, previously on diltiazem which was held for low blood pressure, last seen by cardiologist in 2017, checking weights and calling PCP if > 2-3 lbs gained in 1 day or 5 lbs in 1 week Medications to avoid in the elderly: no issues noted Drug interactions: no issues noted Other issues noted:   Medication assistance: Patient has completed 2019 applications for Spiriva and Dulera at Dr. Milta Deiters office.  Per Kyla Balzarine, billing assistance for Dr. Welton Flakes, applications will be mailed to [employer] and faxed to [facility] this week.  Patient approved for both programs in 2018.  Patient may qualify for Extra Help / LIS but needs to find financial documents to confirm.    Inhaler Technique: Patient may benefit from use of a spacer with his inhalers.  [facility] can assist with procuring spacer at no charge for patient and can review administration technique  with patient.   Weights: Patient reports he has trouble reading his current scale and would prefer using a digital scale.  I will communicate need to [facility] RN as [facility] may be able to provide scale to patient.   Housing: [facility] [name] will follow -up with patient regarding housing resources for the [place] area.     PLAN: I will follow-up with patient on Friday regarding LIS eligibility and assist with applying on-line if applicable.   I will follow-up with pulmonologist regarding prescription for inhaler spacer.  I will communicate with [facility] RN regarding scale.   Haynes Hoehn, PharmD, [facility] Clinical Pharmacist [name] [phone]         Plan:

## 2017-09-28 ENCOUNTER — Other Ambulatory Visit: Payer: Self-pay | Admitting: Pharmacist

## 2017-09-28 ENCOUNTER — Other Ambulatory Visit: Payer: Self-pay

## 2017-09-28 ENCOUNTER — Other Ambulatory Visit: Payer: Self-pay | Admitting: *Deleted

## 2017-09-28 ENCOUNTER — Ambulatory Visit: Payer: Self-pay | Admitting: Pharmacist

## 2017-09-28 DIAGNOSIS — J440 Chronic obstructive pulmonary disease with acute lower respiratory infection: Secondary | ICD-10-CM | POA: Diagnosis not present

## 2017-09-28 DIAGNOSIS — Z7951 Long term (current) use of inhaled steroids: Secondary | ICD-10-CM | POA: Diagnosis not present

## 2017-09-28 DIAGNOSIS — J45909 Unspecified asthma, uncomplicated: Secondary | ICD-10-CM | POA: Diagnosis not present

## 2017-09-28 DIAGNOSIS — J189 Pneumonia, unspecified organism: Secondary | ICD-10-CM | POA: Diagnosis not present

## 2017-09-28 DIAGNOSIS — J441 Chronic obstructive pulmonary disease with (acute) exacerbation: Secondary | ICD-10-CM | POA: Diagnosis not present

## 2017-09-28 DIAGNOSIS — I5032 Chronic diastolic (congestive) heart failure: Secondary | ICD-10-CM | POA: Diagnosis not present

## 2017-09-28 DIAGNOSIS — I110 Hypertensive heart disease with heart failure: Secondary | ICD-10-CM | POA: Diagnosis not present

## 2017-09-28 DIAGNOSIS — Z720 Tobacco use: Secondary | ICD-10-CM | POA: Diagnosis not present

## 2017-09-28 DIAGNOSIS — J9611 Chronic respiratory failure with hypoxia: Secondary | ICD-10-CM | POA: Diagnosis not present

## 2017-09-28 DIAGNOSIS — Z9981 Dependence on supplemental oxygen: Secondary | ICD-10-CM | POA: Diagnosis not present

## 2017-09-28 MED ORDER — OPTICHAMBER ADVANTAGE-LG MASK MISC: 1 refills | Status: AC

## 2017-09-28 NOTE — Patient Outreach (Signed)
[name]N) Care Management  09/28/2017  Hershal CoriaDennis R Gethers 04/22/1956 [id number]  Telephone Outreach to confirm scheduled collaborative visit for 10/02/17 with [name] Lincoln MaxinKelly Harrison.   Plan: I will see Mr. Alejandro in his home next week for a collaborative visit.    Marja Kayslisa Scherrie Seneca MHA,BSN,RN,CCM [facility] Care Management  [phone]

## 2017-09-28 NOTE — Patient Outreach (Signed)
[name]N) Care Management  09/28/2017  Dustin CoriaDennis R [name] 12/24/1955 [id number]   CSW called & spoke with patient who was currently working with Home Health PT and requested that CSW call back as they had just arrived at his home.   CSW will call patient back Monday, 1/14.    Lincoln MaxinKelly Taleyah Hillman, [name] [name]  Clinical Social Worker cell #: [phone]

## 2017-09-28 NOTE — Patient Outreach (Signed)
[name]N) Care Management  09/28/2017  Dustin CoriaDennis R [name] 1956/07/11 [id number]  [facility] [place].  Pharmacy has received prescription for inhaler spacer with mask.  Cost = $34.99.  They will need patient's Medicare Part B number and diagnosis code for spacer to bill insurance.  Product in stock and will be filled today.   Successful call to Dustin [name].  HIPAA identifiers verified.    Medication assistance:   Dustin [name] reports his financial documents are in [place] so he has not confirmed what his total monthly SSI is before premium deducted.  He reports that he does not have any other source of income or assets.  He estimates his monthly income = $1034.00.   I assisted patient with online application for Extra Help as he is preliminarily eligible based on this income.  Patient aware that he will receive a letter in the mail in 4-6 weeks to his current address in [place] with application decision.    Medication Management:  Patient agreeable to try a spacer with his inhalers to optimize drug delivery to his lungs.  Patient will call [facility] with his Medicare number to run prescription through insurance.  [facility] pharmacy director has approved funds to pay for spacer for patient.    CHL inbasket message sent to patient's pulmonary office to request diagnosis code for spacer   Plan: Home visit planned with Dustin [name] next Tuesday, January 15, at 9:30AM.  I will pick up spacer from pharmacy and bring to our visit to review administration technique.    I will make an appointment to follow-up with Dustin [name] in 6 weeks regarding LIS decision.    Haynes Hoehnolleen Clotilde Loth, PharmD, [facility] Clinical Pharmacist [facility] [phone]

## 2017-10-01 ENCOUNTER — Telehealth: Payer: Self-pay

## 2017-10-01 ENCOUNTER — Other Ambulatory Visit: Payer: Self-pay | Admitting: *Deleted

## 2017-10-01 DIAGNOSIS — Z7951 Long term (current) use of inhaled steroids: Secondary | ICD-10-CM | POA: Diagnosis not present

## 2017-10-01 DIAGNOSIS — J441 Chronic obstructive pulmonary disease with (acute) exacerbation: Secondary | ICD-10-CM | POA: Diagnosis not present

## 2017-10-01 DIAGNOSIS — J45909 Unspecified asthma, uncomplicated: Secondary | ICD-10-CM | POA: Diagnosis not present

## 2017-10-01 DIAGNOSIS — J9611 Chronic respiratory failure with hypoxia: Secondary | ICD-10-CM | POA: Diagnosis not present

## 2017-10-01 DIAGNOSIS — J440 Chronic obstructive pulmonary disease with acute lower respiratory infection: Secondary | ICD-10-CM | POA: Diagnosis not present

## 2017-10-01 DIAGNOSIS — Z720 Tobacco use: Secondary | ICD-10-CM | POA: Diagnosis not present

## 2017-10-01 DIAGNOSIS — J189 Pneumonia, unspecified organism: Secondary | ICD-10-CM | POA: Diagnosis not present

## 2017-10-01 DIAGNOSIS — I110 Hypertensive heart disease with heart failure: Secondary | ICD-10-CM | POA: Diagnosis not present

## 2017-10-01 DIAGNOSIS — Z9981 Dependence on supplemental oxygen: Secondary | ICD-10-CM | POA: Diagnosis not present

## 2017-10-01 DIAGNOSIS — I5032 Chronic diastolic (congestive) heart failure: Secondary | ICD-10-CM | POA: Diagnosis not present

## 2017-10-01 NOTE — Telephone Encounter (Signed)
[facility]ustin [name] called requesting OT for 2 x wk for 4 wks and also for a Child psychotherapistsocial worker consult.  I gave the ok to both.  dbs  Call back # [phone].  dbs

## 2017-10-01 NOTE — Patient Outreach (Signed)
[name]) Care Management  10/01/2017  Dustin [name] [birth year] [id number]  I reached out to Dustin [name] today in follow up for transition of care services and in anticipation of our collaborative home visit tomorrow.   Dustin [name] says he feels tired and less energetic and feels he has not recovered well from his most recent hospitalization.   He confirmed that he received the digital scales we requested but needs help setting them up tomorrow.   Plan: I will see Dustin [name] at home for a collaborative visit with [name] Lincoln MaxinKelly Harrison.    Marja Kayslisa Gilboy MHA,BSN,RN,CCM [facility] Care Management  [phone]

## 2017-10-02 ENCOUNTER — Other Ambulatory Visit: Payer: Self-pay | Admitting: *Deleted

## 2017-10-02 ENCOUNTER — Other Ambulatory Visit: Payer: Self-pay

## 2017-10-02 ENCOUNTER — Other Ambulatory Visit: Payer: Self-pay | Admitting: Pharmacist

## 2017-10-02 DIAGNOSIS — I5032 Chronic diastolic (congestive) heart failure: Secondary | ICD-10-CM | POA: Diagnosis not present

## 2017-10-02 DIAGNOSIS — J189 Pneumonia, unspecified organism: Secondary | ICD-10-CM | POA: Diagnosis not present

## 2017-10-02 DIAGNOSIS — Z7951 Long term (current) use of inhaled steroids: Secondary | ICD-10-CM | POA: Diagnosis not present

## 2017-10-02 DIAGNOSIS — Z720 Tobacco use: Secondary | ICD-10-CM | POA: Diagnosis not present

## 2017-10-02 DIAGNOSIS — Z9981 Dependence on supplemental oxygen: Secondary | ICD-10-CM | POA: Diagnosis not present

## 2017-10-02 DIAGNOSIS — J45909 Unspecified asthma, uncomplicated: Secondary | ICD-10-CM | POA: Diagnosis not present

## 2017-10-02 DIAGNOSIS — J9611 Chronic respiratory failure with hypoxia: Secondary | ICD-10-CM | POA: Diagnosis not present

## 2017-10-02 DIAGNOSIS — I110 Hypertensive heart disease with heart failure: Secondary | ICD-10-CM | POA: Diagnosis not present

## 2017-10-02 DIAGNOSIS — J441 Chronic obstructive pulmonary disease with (acute) exacerbation: Secondary | ICD-10-CM | POA: Diagnosis not present

## 2017-10-02 DIAGNOSIS — J440 Chronic obstructive pulmonary disease with acute lower respiratory infection: Secondary | ICD-10-CM | POA: Diagnosis not present

## 2017-10-02 NOTE — Patient Outreach (Signed)
[name]N) Care Management   10/02/2017  Hershal CoriaDennis R Dimare Apr 06, 1956 [id number]  Hershal CoriaDennis R Brinkmeier is an 62 y.o.  gentleman with past medical history which includes COPD (Gold IV Classificaton), asthma, hypertension, anxiety and depression, and GERD who has had 3 inpatient hospital admissions in the last 6 months, all for respiratory ailments. Most recently, [name] was [facility] from 09[facility]18/17 - 09/24/17 with acute on chronic respiratory failure with hypoxia, sepsis (w/ abundant h-Flu, Beta Lactamase negative), and pneumonia.  Subjective: "I'm feeling pretty good these days."   Objective:  BP 108/78   Pulse 86   Wt 214 lb (97.1 kg)   SpO2 94%   BMI 29.85 kg/m   Review of Systems  Constitutional: Negative.   HENT: Negative.   Eyes: Negative.   Respiratory: Negative.  Negative for cough, sputum production, shortness of breath and wheezing.        Respiratory status baseline  Cardiovascular: Negative.  Negative for chest pain, palpitations, orthopnea and leg swelling.  Gastrointestinal: Negative.   Genitourinary: Negative.   Musculoskeletal: Negative.  Negative for falls.  Skin: Negative.   Neurological: Negative.   Psychiatric/Behavioral: Negative.     Physical Exam  Constitutional: He is oriented to person, place, and time. Vital signs are normal. He appears well-developed and well-nourished. He is active. He does not have a sickly appearance. He does not appear ill.  Cardiovascular: Normal rate, regular rhythm and normal heart sounds.  Respiratory: Effort normal. He has decreased breath sounds in the right lower field and the left lower field. He has no wheezes. He has no rales.  GI: Soft. Bowel sounds are normal. He exhibits no distension. There is no tenderness.  Neurological: He is alert and oriented to person, place, and time.  Skin: Skin is warm, dry and intact.  Psychiatric: He has a normal mood and affect. His speech is  normal and behavior is normal. Judgment and thought content normal. Cognition and memory are normal.    Encounter Medications:   Outpatient Encounter Medications as of 10/02/2017  Medication Sig  . Albuterol Sulfate (PROAIR RESPICLICK) 108 (90 BASE) MCG/ACT AEPB Inhale 2 puffs into the lungs every 4 (four) hours as needed (for shortness of breath). Reported on 12/10/2015  . ALPRAZolam (XANAX) 1 MG tablet Take 1 tablet (1 mg total) by mouth 3 (three) times daily as needed for anxiety.  Marland Kitchen. amoxicillin (AMOXIL) 500 MG capsule Take 1 capsule (500 mg total) by mouth every 8 (eight) hours.  . Cholecalciferol (VITAMIN D) 2000 units CAPS Take 2,000 Units by mouth daily.  Marland Kitchen. escitalopram (LEXAPRO) 20 MG tablet Take 20 mg by mouth at bedtime.   . furosemide (LASIX) 20 MG tablet Take 20 mg by mouth daily.   Marland Kitchen. gabapentin (NEURONTIN) 600 MG tablet TAKE 1 TABLET BY MOUTH THREE TIMES DAILY  . guaiFENesin (MUCINEX) 600 MG 12 hr tablet Take 1 tablet (600 mg total) by mouth 2 (two) times daily.  Marland Kitchen. guaiFENesin-dextromethorphan (ROBITUSSIN DM) 100-10 MG/5ML syrup Take 5 mLs by mouth every 6 (six) hours as needed for cough.  Marland Kitchen. ipratropium-albuterol (DUONEB) 0.5-2.5 (3) MG/3ML SOLN Take 3 mLs by nebulization 4 (four) times daily as needed (for shortness of breath).  . mometasone-formoterol (DULERA) 200-5 MCG/ACT AERO Inhale 1 puff into the lungs 2 (two) times daily.   . potassium chloride (K-DUR,KLOR-CON) 10 MEQ tablet Take 10 mEq by mouth daily.   . Probiotic Product (PROBIOTIC-10) CAPS Take 1 capsule by mouth 2 (two)  times daily.   Marland Kitchen rOPINIRole (REQUIP) 0.25 MG tablet TAKE 1 TABLET BY MOUTH TWICE DAILY FOR CRAMPS  . Spacer/Aero-Holding Chambers (OPTICHAMBER ADVANTAGE-LG MASK) MISC Use as directed with inhaler diag  j44.1  . theophylline (UNIPHYL) 400 MG 24 hr tablet Take 400 mg by mouth daily.   . Tiotropium Bromide Monohydrate (SPIRIVA RESPIMAT) 2.5 MCG/ACT AERS Inhale 2 puffs into the lungs daily.    Functional  Status:   In your present state of health, do you have any difficulty performing the following activities: 09/19/2017 08/06/2017  Hearing? N N  Vision? N N  Difficulty concentrating or making decisions? N N  Walking or climbing stairs? Y N  Dressing or bathing? Y N  Doing errands, shopping? N N  Some recent data might be hidden    Fall/Depression Screening:    Fall Risk  10/02/2017 09/26/2017 04/12/2016  Falls in the past year? Yes No Yes  Number falls in past yr: - - 1  Injury with Fall? - - No  Risk for fall due to : Impaired mobility Impaired mobility History of fall(s);Impaired balance/gait;Impaired mobility;Medication side effect  Risk for fall due to: Comment - secondary to deconditioning -  Follow up - - Falls prevention discussed   PHQ 2/9 Scores 09/26/2017 04/12/2016 03/29/2016 03/02/2016 02/02/2016 12/13/2015  PHQ - 2 Score 2 2 2 2 2 2   PHQ- 9 Score 7 9 7 8 8 8     Assessment: 62 year old male patient with chronic pulmonary disease. Mr. Bingley recently moved back to [place] and is living with his son and daughter in law. He is doing well after a recent hospitalization.   Chronic Pulmonary Illness - as noted, Mr. Carillo has had 3 admissions for respiratory/pulmonary illness over the last 6 months. Today his breath sounds were clear although diminished in the bases (baseline for him). He was using O2 @ 2l/[place] continuously and says he turns his liter flow up to 3.5 when he is very active or going out to appointments. He denies having cough or shortness of breath or other pulmonary/respiratory symptom that is unusual for him.   Home Health Follow Up - The [facility] nurse was present during my visit today and states she is visiting Mr. Costlow twice weekly. He is also being followed by PT and OT.    Medication Management - Mr. Bhardwaj was seen also by [facility] Pharmacist Jill Side Summe today during our visit. Please see Dr. Jon Billings assessment note for details. She reviewed Mr.  kipton skillen and is providing assistance to him with patient assistance applications.   Provider Follow Up - Mr.Beehler had a post hospital follow up appointment scheduled for 10/09/17 with Dr. Beverely Risen but was called by the PCP office today and given a later date of 10/17/17. He also has a previously scheduled appointment on 10/18/17 with Dr. Freda Munro, Pulmonologist (same office building). Given that Mr. Wolz has had frequent back to back admissions for pulmonary illness and was discharged from the hospital on 09/24/17. I advocated for Mr. Muralles to have an appointment with a provider in sooner than 23 days and a call was returned and a message left with me indicating that both providers will be out of the office until around the time of Mr. Verba scheduled appointments.   I reviewed with Mr. Rousseau the importance of him notifying his home health nurse, me, or the provider office as early as possible of any new or worsened symptoms.    Transportation Concerns -  Mr. Weber is scheduled to speak with [name] Lincoln Maxin by phone today and will review transportation resources at that time.    Housing Concerns - Mr. Moroz received and is completing an application for an apartment very near his son's home. His son will help him submit the application. He is welcome to stay with his son indefinitely.   Plan:  Mr. Nardelli will take his medications as prescribed and will work with the home health team, [facility] Social Worker, and [facility] Pharmacist.   Mr. Gandolfi will see his providers as scheduled.    Marja Kays MHA,BSN,RN,CCM [facility] Care Management  [phone]

## 2017-10-02 NOTE — Patient Outreach (Signed)
[name]N) Care Management  10/02/2017  Hershal CoriaDennis R Lio [birth year] [id number]  Subjective:  Success[facility] home [facility] with [facility] and [name] who is living at his son's residence in [place].  HIPAA identifiers verified.   [name] reports that he received a new digital scale from [facility] and started using it yesterday.  He reports the readings from this scale are very similar to his provider's office.  He reports he checks his blood pressure twice a day with a wrist monitor.  During our visit, [facility] staff called on Mr. Kallam for an appointment.  [facility] RN and [facility] [name] are assisting with housing resources.  If [name] relocates permanently to [place], he will look into changing his primary care and pulmonary providers from [place].    Objective:  Vitals during visit:  Blood pressure: 108/78 Heart rate: 86 02 saturation on 2L [place] continuous 02 = 94%  Assessment:  Medication management:   [facility] paid for spacer + mask from [facility] for patient to use with his inhalers.  I picked up spacer from pharmacy and brought to patient today.  We reviewed spacer + mask technique and how to clean and store it.  Patient voiced understanding on how to use.  He thinks that it will help improve how much medication he breathes in when he uses his Dulera and Albuterol.  He does not think he needs to use it with the Spiriva Respimat. He is aware to rinse and spit after using Dulera.      Patient will complete his course of amoxicillin today and reports no missed doses.   Medication assistance:   2019 [facility] applications submitted by Dr. Milta DeitersKhan's office.  We reviewed application process including importance of signing and returning attestation letter for [employer] for [employer].    Patient believes he filled out his previous address in [place] on the PAP applications which will need to be changed to an updated [place] address if approved.   Plan: I  will route my note to PCP and pulmonologist.   I will follow-up with PCP office Kyla Balzarine([place] with Dr. Welton FlakesKhan) regarding PAP's next week and update patient accordingly.   I will follow-up with patient in 5 weeks regarding LIS / Extra Help application.   Haynes Hoehnolleen Drayce Tawil, PharmD, [facility] Clinical Pharmacist [facility] [phone]

## 2017-10-03 DIAGNOSIS — Z720 Tobacco use: Secondary | ICD-10-CM | POA: Diagnosis not present

## 2017-10-03 DIAGNOSIS — J45909 Unspecified asthma, uncomplicated: Secondary | ICD-10-CM | POA: Diagnosis not present

## 2017-10-03 DIAGNOSIS — Z9981 Dependence on supplemental oxygen: Secondary | ICD-10-CM | POA: Diagnosis not present

## 2017-10-03 DIAGNOSIS — J9611 Chronic respiratory failure with hypoxia: Secondary | ICD-10-CM | POA: Diagnosis not present

## 2017-10-03 DIAGNOSIS — Z7951 Long term (current) use of inhaled steroids: Secondary | ICD-10-CM | POA: Diagnosis not present

## 2017-10-03 DIAGNOSIS — J440 Chronic obstructive pulmonary disease with acute lower respiratory infection: Secondary | ICD-10-CM | POA: Diagnosis not present

## 2017-10-03 DIAGNOSIS — J441 Chronic obstructive pulmonary disease with (acute) exacerbation: Secondary | ICD-10-CM | POA: Diagnosis not present

## 2017-10-03 DIAGNOSIS — J189 Pneumonia, unspecified organism: Secondary | ICD-10-CM | POA: Diagnosis not present

## 2017-10-03 DIAGNOSIS — I5032 Chronic diastolic (congestive) heart failure: Secondary | ICD-10-CM | POA: Diagnosis not present

## 2017-10-03 DIAGNOSIS — I110 Hypertensive heart disease with heart failure: Secondary | ICD-10-CM | POA: Diagnosis not present

## 2017-10-03 NOTE — Patient Outreach (Signed)
[name]N) Care Management  10/03/2017  Dustin [name] 04-19-1956 [id number]   CSW called & spoke with patient to discuss referral for housing & transportation. Patient states that he is working on the application provided to him by [facility] RNCM, Serina CowperAlisa for [place] and plans to turn that in today or tomorrow. CSW scheduled initial home visit for Friday, 1/18 at 12:30 and will provide application [facility] as well. Full assessment & note to follow.    Lincoln MaxinKelly Chau Savell, [name] [name]  Clinical Social Worker cell #: [phone]

## 2017-10-04 ENCOUNTER — Telehealth: Payer: Self-pay | Admitting: Pharmacist

## 2017-10-04 ENCOUNTER — Other Ambulatory Visit: Payer: Self-pay | Admitting: *Deleted

## 2017-10-04 DIAGNOSIS — J441 Chronic obstructive pulmonary disease with (acute) exacerbation: Secondary | ICD-10-CM | POA: Diagnosis not present

## 2017-10-04 DIAGNOSIS — Z720 Tobacco use: Secondary | ICD-10-CM | POA: Diagnosis not present

## 2017-10-04 DIAGNOSIS — J189 Pneumonia, unspecified organism: Secondary | ICD-10-CM | POA: Diagnosis not present

## 2017-10-04 DIAGNOSIS — I110 Hypertensive heart disease with heart failure: Secondary | ICD-10-CM | POA: Diagnosis not present

## 2017-10-04 DIAGNOSIS — J9611 Chronic respiratory failure with hypoxia: Secondary | ICD-10-CM | POA: Diagnosis not present

## 2017-10-04 DIAGNOSIS — J440 Chronic obstructive pulmonary disease with acute lower respiratory infection: Secondary | ICD-10-CM | POA: Diagnosis not present

## 2017-10-04 DIAGNOSIS — J45909 Unspecified asthma, uncomplicated: Secondary | ICD-10-CM | POA: Diagnosis not present

## 2017-10-04 DIAGNOSIS — Z9981 Dependence on supplemental oxygen: Secondary | ICD-10-CM | POA: Diagnosis not present

## 2017-10-04 DIAGNOSIS — Z7951 Long term (current) use of inhaled steroids: Secondary | ICD-10-CM | POA: Diagnosis not present

## 2017-10-04 DIAGNOSIS — I5032 Chronic diastolic (congestive) heart failure: Secondary | ICD-10-CM | POA: Diagnosis not present

## 2017-10-04 NOTE — Patient Outreach (Signed)
[name]) Care Management  10/04/2017  Hershal CoriaDennis R Cherubini Jul 15, 1956 [id number]   Care coordination call placed to Dr. Milta DeitersKhan's office.  I left a message for an update on PAP application status.    Plan: I will await call back from provider office.  If I have not heard back by the end of the week, I will call them early next week.   Haynes Hoehnolleen Marvon [place], PharmD, [facility] Clinical Pharmacist [facility] [phone]

## 2017-10-04 NOTE — Patient Outreach (Signed)
[name]N) Care Management  10/04/2017  Dustin [name] Jul 17, 1956 [id number]  I reached out to Dustin [name] today to follow up and let him know about the local provider availability he inquired about during our visit on Monday.   Plan: I will follow up with Dustin [name] next week as scheduled.    Marja Kayslisa Gilboy MHA,BSN,RN,CCM [facility] Care Management  [phone]

## 2017-10-05 ENCOUNTER — Encounter: Payer: Self-pay | Admitting: *Deleted

## 2017-10-05 ENCOUNTER — Other Ambulatory Visit: Payer: Self-pay | Admitting: *Deleted

## 2017-10-05 DIAGNOSIS — I5032 Chronic diastolic (congestive) heart failure: Secondary | ICD-10-CM | POA: Diagnosis not present

## 2017-10-05 DIAGNOSIS — Z7951 Long term (current) use of inhaled steroids: Secondary | ICD-10-CM | POA: Diagnosis not present

## 2017-10-05 DIAGNOSIS — J189 Pneumonia, unspecified organism: Secondary | ICD-10-CM | POA: Diagnosis not present

## 2017-10-05 DIAGNOSIS — J45909 Unspecified asthma, uncomplicated: Secondary | ICD-10-CM | POA: Diagnosis not present

## 2017-10-05 DIAGNOSIS — I110 Hypertensive heart disease with heart failure: Secondary | ICD-10-CM | POA: Diagnosis not present

## 2017-10-05 DIAGNOSIS — J441 Chronic obstructive pulmonary disease with (acute) exacerbation: Secondary | ICD-10-CM | POA: Diagnosis not present

## 2017-10-05 DIAGNOSIS — J440 Chronic obstructive pulmonary disease with acute lower respiratory infection: Secondary | ICD-10-CM | POA: Diagnosis not present

## 2017-10-05 DIAGNOSIS — Z9981 Dependence on supplemental oxygen: Secondary | ICD-10-CM | POA: Diagnosis not present

## 2017-10-05 DIAGNOSIS — Z720 Tobacco use: Secondary | ICD-10-CM | POA: Diagnosis not present

## 2017-10-05 DIAGNOSIS — J9611 Chronic respiratory failure with hypoxia: Secondary | ICD-10-CM | POA: Diagnosis not present

## 2017-10-08 ENCOUNTER — Other Ambulatory Visit: Payer: Self-pay | Admitting: *Deleted

## 2017-10-08 DIAGNOSIS — Z9981 Dependence on supplemental oxygen: Secondary | ICD-10-CM | POA: Diagnosis not present

## 2017-10-08 DIAGNOSIS — Z720 Tobacco use: Secondary | ICD-10-CM | POA: Diagnosis not present

## 2017-10-08 DIAGNOSIS — J441 Chronic obstructive pulmonary disease with (acute) exacerbation: Secondary | ICD-10-CM | POA: Diagnosis not present

## 2017-10-08 DIAGNOSIS — Z7951 Long term (current) use of inhaled steroids: Secondary | ICD-10-CM | POA: Diagnosis not present

## 2017-10-08 DIAGNOSIS — I5032 Chronic diastolic (congestive) heart failure: Secondary | ICD-10-CM | POA: Diagnosis not present

## 2017-10-08 DIAGNOSIS — J440 Chronic obstructive pulmonary disease with acute lower respiratory infection: Secondary | ICD-10-CM | POA: Diagnosis not present

## 2017-10-08 DIAGNOSIS — J9611 Chronic respiratory failure with hypoxia: Secondary | ICD-10-CM | POA: Diagnosis not present

## 2017-10-08 DIAGNOSIS — J189 Pneumonia, unspecified organism: Secondary | ICD-10-CM | POA: Diagnosis not present

## 2017-10-08 DIAGNOSIS — J45909 Unspecified asthma, uncomplicated: Secondary | ICD-10-CM | POA: Diagnosis not present

## 2017-10-08 DIAGNOSIS — I110 Hypertensive heart disease with heart failure: Secondary | ICD-10-CM | POA: Diagnosis not present

## 2017-10-08 NOTE — Patient Outreach (Signed)
[name]) Care Management  [facility] Social Work  10/08/2017  Dustin [name] 10/03/1955 [id number]   Encounter Medications:  Outpatient Encounter Medications as of 10/05/2017  Medication Sig  . Albuterol Sulfate (PROAIR RESPICLICK) 108 (90 BASE) MCG/ACT AEPB Inhale 2 puffs into the lungs every 4 (four) hours as needed (for shortness of breath). Reported on 12/10/2015  . ALPRAZolam (XANAX) 1 MG tablet Take 1 tablet (1 mg total) by mouth 3 (three) times daily as needed for anxiety.  Marland Kitchen amoxicillin (AMOXIL) 500 MG capsule Take 1 capsule (500 mg total) by mouth every 8 (eight) hours.  . Cholecalciferol (VITAMIN D) 2000 units CAPS Take 2,000 Units by mouth daily.  Marland Kitchen escitalopram (LEXAPRO) 20 MG tablet Take 20 mg by mouth at bedtime.   . furosemide (LASIX) 20 MG tablet Take 20 mg by mouth daily.   Marland Kitchen gabapentin (NEURONTIN) 600 MG tablet TAKE 1 TABLET BY MOUTH THREE TIMES DAILY  . guaiFENesin (MUCINEX) 600 MG 12 hr tablet Take 1 tablet (600 mg total) by mouth 2 (two) times daily.  Marland Kitchen guaiFENesin-dextromethorphan (ROBITUSSIN DM) 100-10 MG/5ML syrup Take 5 mLs by mouth every 6 (six) hours as needed for cough.  Marland Kitchen ipratropium-albuterol (DUONEB) 0.5-2.5 (3) MG/3ML SOLN Take 3 mLs by nebulization 4 (four) times daily as needed (for shortness of breath).  . mometasone-formoterol (DULERA) 200-5 MCG/ACT AERO Inhale 1 puff into the lungs 2 (two) times daily.   . potassium chloride (K-DUR,KLOR-CON) 10 MEQ tablet Take 10 mEq by mouth daily.   . Probiotic Product (PROBIOTIC-10) CAPS Take 1 capsule by mouth 2 (two) times daily.   Marland Kitchen rOPINIRole (REQUIP) 0.25 MG tablet TAKE 1 TABLET BY MOUTH TWICE DAILY FOR CRAMPS  . Spacer/Aero-Holding Chambers (OPTICHAMBER ADVANTAGE-LG MASK) MISC Use as directed with inhaler diag  j44.1  . theophylline (UNIPHYL) 400 MG 24 hr tablet Take 400 mg by mouth daily.   . Tiotropium Bromide Monohydrate (SPIRIVA RESPIMAT) 2.5 MCG/ACT AERS Inhale 2 puffs into the lungs  daily.    No facility-administered encounter medications on file as of 10/05/2017.     Functional Status:  In your present state of health, do you have any difficulty performing the following activities: 10/05/2017 09/19/2017  Hearing? N N  Vision? N N  Difficulty concentrating or making decisions? N N  Walking or climbing stairs? Y Y  Dressing or bathing? Y Y  Doing errands, shopping? N N  Some recent data might be hidden    Fall/Depression Screening:  PHQ 2/9 Scores 10/05/2017 09/26/2017 04/12/2016 03/29/2016 03/02/2016 02/02/2016 12/13/2015  PHQ - 2 Score 2 2 2 2 2 2 2   PHQ- 9 Score 7 7 9 7 8 8 8     Assessment:   CSW had received referral from [facility] RNCM, Alisa for housing & transportation needs. Patient had recently moved from living with his daughter in [place] to his son & daughter-in-law in [place]. Patient sleeps on the couch is their living room and feels that he needs his own space and apartment. [facility] RNCM had provided patient with an application for [facility] which he completed and had his daughter-in-law turn in on Thursday, 1/17. CSW also provided patient with application for [address] which he states will complete over the weekend and perhaps drive by to check it out. Patient is very ambitious and motivated. CSW provided patient also with information on [facility] [employer]) as patient states that he has a car but at this time does not feel completely comfortable driving  yet.   CSW also during intake assessments, spoke with patient about advance directives. Patient reports that he does not have one yet but expressed interest in completing it. CSW provided patient with a packet and encouraged him to discuss it with his family. Patient is leaning towards assigning his sister as HCPOA. CSW instructed patient to call CSW once signed & notarized, to have it scanned into Epic system.   Plan:   CSW will follow-up with patient in 2 weeks.    [facility] CM Care Plan Problem One     Most Recent Value  Care Plan Problem One  Need for assistance in finding housing  Role Documenting the Problem One  Clinical Social Worker  Care Plan for Problem One  Active  [facility] Long Term Goal   Within the next 60 days, patient would like to move into his own apartment.   [facility] Long Term Goal Start Date  10/08/17  Interventions for Problem One Long Term Goal  CSW provided patient with application for [address], Patient has submitted application for [facility] 10/04/17.   [facility] CM Short Term Goal #1   Within the next 30 days, patient will complete & submit application to apartment complexes.   TH[facility] CM Short Term Goal #1 Start Date  10/08/17  Interventions for Short Term Goal #1  CSW provided application to [facility] & patient submitted application for [place] on 10/04/17.        Lincoln MaxinKelly Alaylah [place]ington, [name] [name]  Clinical Social Worker cell #: [phone]

## 2017-10-08 NOTE — Patient Outreach (Signed)
[name]) Care Management  10/08/2017  Hershal CoriaDennis R Caven 1955/12/01 [id number]  I was unable to reach [name] by phone today.   Plan: I will return a call to him tomorrow.    Marja Kayslisa Gilboy MHA,BSN,RN,CCM [facility] Care Management  [phone]

## 2017-10-09 ENCOUNTER — Ambulatory Visit: Payer: Self-pay | Admitting: Internal Medicine

## 2017-10-09 ENCOUNTER — Other Ambulatory Visit: Payer: Self-pay | Admitting: *Deleted

## 2017-10-09 DIAGNOSIS — Z720 Tobacco use: Secondary | ICD-10-CM | POA: Diagnosis not present

## 2017-10-09 DIAGNOSIS — J440 Chronic obstructive pulmonary disease with acute lower respiratory infection: Secondary | ICD-10-CM | POA: Diagnosis not present

## 2017-10-09 DIAGNOSIS — Z9981 Dependence on supplemental oxygen: Secondary | ICD-10-CM | POA: Diagnosis not present

## 2017-10-09 DIAGNOSIS — Z7951 Long term (current) use of inhaled steroids: Secondary | ICD-10-CM | POA: Diagnosis not present

## 2017-10-09 DIAGNOSIS — I110 Hypertensive heart disease with heart failure: Secondary | ICD-10-CM | POA: Diagnosis not present

## 2017-10-09 DIAGNOSIS — J9611 Chronic respiratory failure with hypoxia: Secondary | ICD-10-CM | POA: Diagnosis not present

## 2017-10-09 DIAGNOSIS — J189 Pneumonia, unspecified organism: Secondary | ICD-10-CM | POA: Diagnosis not present

## 2017-10-09 DIAGNOSIS — J441 Chronic obstructive pulmonary disease with (acute) exacerbation: Secondary | ICD-10-CM | POA: Diagnosis not present

## 2017-10-09 DIAGNOSIS — J45909 Unspecified asthma, uncomplicated: Secondary | ICD-10-CM | POA: Diagnosis not present

## 2017-10-09 DIAGNOSIS — I5032 Chronic diastolic (congestive) heart failure: Secondary | ICD-10-CM | POA: Diagnosis not present

## 2017-10-09 NOTE — Patient Outreach (Signed)
[name]N) Care Management  10/09/2017  Dustin [name] 12/07/1955 [id number]  Ongoing transition of care services call to Dustin [name] today. He says his home health nurse Dustin [name] just left. Dustin [name] reports and increase in his shortness of breath today. His weight is up to 217lb from 214lb. Dustin [name] says Dustin [name] has called the provider office to report this weight increase and worsening shortness of breath. Dustin [name] has taken all prescribed medications today and denies any changes to his routine or regimen.   Plan: Dustin [name] is to reach out to me if he hasn't heard back from the provider office or from his home health nurse by 4pm. If his condition worsens and/or he has new or additional symptoms that do not resolved with established/recommended treatment, he will call 911.    Dustin [name] MHA,BSN,RN,CCM [facility] Care Management  [phone]

## 2017-10-10 ENCOUNTER — Other Ambulatory Visit: Payer: Self-pay | Admitting: *Deleted

## 2017-10-10 ENCOUNTER — Telehealth: Payer: Self-pay

## 2017-10-10 DIAGNOSIS — I5032 Chronic diastolic (congestive) heart failure: Secondary | ICD-10-CM | POA: Diagnosis not present

## 2017-10-10 DIAGNOSIS — J440 Chronic obstructive pulmonary disease with acute lower respiratory infection: Secondary | ICD-10-CM | POA: Diagnosis not present

## 2017-10-10 DIAGNOSIS — Z720 Tobacco use: Secondary | ICD-10-CM | POA: Diagnosis not present

## 2017-10-10 DIAGNOSIS — Z9981 Dependence on supplemental oxygen: Secondary | ICD-10-CM | POA: Diagnosis not present

## 2017-10-10 DIAGNOSIS — J189 Pneumonia, unspecified organism: Secondary | ICD-10-CM | POA: Diagnosis not present

## 2017-10-10 DIAGNOSIS — I110 Hypertensive heart disease with heart failure: Secondary | ICD-10-CM | POA: Diagnosis not present

## 2017-10-10 DIAGNOSIS — J9611 Chronic respiratory failure with hypoxia: Secondary | ICD-10-CM | POA: Diagnosis not present

## 2017-10-10 DIAGNOSIS — Z7951 Long term (current) use of inhaled steroids: Secondary | ICD-10-CM | POA: Diagnosis not present

## 2017-10-10 DIAGNOSIS — J441 Chronic obstructive pulmonary disease with (acute) exacerbation: Secondary | ICD-10-CM | POA: Diagnosis not present

## 2017-10-10 DIAGNOSIS — J45909 Unspecified asthma, uncomplicated: Secondary | ICD-10-CM | POA: Diagnosis not present

## 2017-10-10 NOTE — Patient Outreach (Signed)
[name]) Care Management  10/10/2017  Dustin [name] 28-Jan-1956 [id number]  Follow up with Dustin [name] again today by phone. He says he remains more short of breath than he was when he was first discharged from the hospital but his condition has not worsened since yesterday. His weight remains @ 217lb (3 lbs up from baseline). Dustin [name] says his home health nurse called the PCP office yesterday but was unable to get an appointment prior to the previously scheduled appointment on 10/17/17.   I reached out to the office today and was able to speak with the practice manager who helped us schedule an appointment for Dustin [name] for tomorrow @ 11:30am with his pulmonologist. Dustin [name] states he has a ride to the appointment and plans to be there tomorrow.   Plan: Dustin [name] will call for any new or worsened symptoms today and will attend his scheduled provider appointment tomorrow.    Dustin [name] MHA,BSN,RN,CCM [facility] Care Management  [phone]

## 2017-10-10 NOTE — Telephone Encounter (Signed)
[facility]ARE NURSE [id number] CALLED VERBAL ORDER FOR COPD EXACERBATION VISIT HIM THIS Friday AND TWICE NEXT WEEK AND ONCE A WEEK AND I GAVE VERBAL ORDER  HEATHER AWARE

## 2017-10-11 ENCOUNTER — Encounter: Payer: Self-pay | Admitting: Internal Medicine

## 2017-10-11 ENCOUNTER — Ambulatory Visit: Payer: Medicare Other | Admitting: Internal Medicine

## 2017-10-11 VITALS — BP 112/79 | HR 108 | Resp 16 | Ht 71.0 in | Wt 222.2 lb

## 2017-10-11 DIAGNOSIS — I509 Heart failure, unspecified: Secondary | ICD-10-CM | POA: Diagnosis not present

## 2017-10-11 DIAGNOSIS — J449 Chronic obstructive pulmonary disease, unspecified: Secondary | ICD-10-CM | POA: Diagnosis not present

## 2017-10-11 DIAGNOSIS — J9611 Chronic respiratory failure with hypoxia: Secondary | ICD-10-CM | POA: Diagnosis not present

## 2017-10-11 DIAGNOSIS — J181 Lobar pneumonia, unspecified organism: Secondary | ICD-10-CM | POA: Diagnosis not present

## 2017-10-11 MED ORDER — ALBUTEROL SULFATE 108 (90 BASE) MCG/ACT IN AEPB: 2.0000 | INHALATION_SPRAY | RESPIRATORY_TRACT | 4 refills | Status: DC | PRN

## 2017-10-11 NOTE — Progress Notes (Signed)
[facility] [place], [place] [address]  Pulmonary Sleep Medicine  Office Visit Note  Patient Name: Dustin [name] DOB: November 05, 1955 MRN [id number]  Date of Service: 10/11/2017  Complaints/HPI:  Patient was once again Hospital.  Patient was diagnosed with pneumonia.  He says he is feeling a lot better now.  He was discharged home with antibiotics which have been completed.  Continues with his oxygen as ordered.  Still with some shortness of breath when he exerts himself.  Has positive cough denies any chest pain or palpitations.  States that he is not smoking.  ROS  General: (-) fever, (-) chills, (-) night sweats, (-) weakness Skin: (-) rashes, (-) itching,. Eyes: (-) visual changes, (-) redness, (-) itching. Nose and Sinuses: (-) nasal stuffiness or itchiness, (-) postnasal drip, (-) nosebleeds, (-) sinus trouble. Mouth and Throat: (-) sore throat, (-) hoarseness. Neck: (-) swollen glands, (-) enlarged thyroid, (-) neck pain. Respiratory: + cough, (-) bloody sputum, + shortness of breath, + wheezing. Cardiovascular: - ankle swelling, (-) chest pain. Lymphatic: (-) lymph node enlargement. Neurologic: (-) numbness, (-) tingling. Psychiatric: (-) anxiety, (-) depression   Current Medication: Outpatient Encounter Medications as of 10/11/2017  Medication Sig  . Albuterol Sulfate (PROAIR RESPICLICK) 191 (90 BASE) MCG/ACT AEPB Inhale 2 puffs into the lungs every 4 (four) hours as needed (for shortness of breath). Reported on 12/10/2015  . ALPRAZolam (XANAX) 1 MG tablet Take 1 tablet (1 mg total) by mouth 3 (three) times daily as needed for anxiety.  Marland Kitchen amoxicillin (AMOXIL) 500 MG capsule Take 1 capsule (500 mg total) by mouth every 8 (eight) hours.  . Cholecalciferol (VITAMIN D) 2000 units CAPS Take 2,000 Units by mouth daily.  Marland Kitchen escitalopram (LEXAPRO) 20 MG tablet Take 20 mg by mouth at bedtime.   . furosemide (LASIX) 20 MG tablet Take 20 mg by mouth daily.   Marland Kitchen  gabapentin (NEURONTIN) 600 MG tablet TAKE 1 TABLET BY MOUTH THREE TIMES DAILY  . guaiFENesin (MUCINEX) 600 MG 12 hr tablet Take 1 tablet (600 mg total) by mouth 2 (two) times daily.  Marland Kitchen guaiFENesin-dextromethorphan (ROBITUSSIN DM) 100-10 MG/5ML syrup Take 5 mLs by mouth every 6 (six) hours as needed for cough.  Marland Kitchen ipratropium-albuterol (DUONEB) 0.5-2.5 (3) MG/3ML SOLN Take 3 mLs by nebulization 4 (four) times daily as needed (for shortness of breath).  . mometasone-formoterol (DULERA) 200-5 MCG/ACT AERO Inhale 1 puff into the lungs 2 (two) times daily.   . potassium chloride (K-DUR,KLOR-CON) 10 MEQ tablet Take 10 mEq by mouth daily.   . Probiotic Product (PROBIOTIC-10) CAPS Take 1 capsule by mouth 2 (two) times daily.   Marland Kitchen rOPINIRole (REQUIP) 0.25 MG tablet TAKE 1 TABLET BY MOUTH TWICE DAILY FOR CRAMPS  . Spacer/Aero-Holding Chambers (OPTICHAMBER ADVANTAGE-LG MASK) MISC Use as directed with inhaler diag  j44.1  . theophylline (UNIPHYL) 400 MG 24 hr tablet Take 400 mg by mouth daily.   . Tiotropium Bromide Monohydrate (SPIRIVA RESPIMAT) 2.5 MCG/ACT AERS Inhale 2 puffs into the lungs daily.    No facility-administered encounter medications on file as of 10/11/2017.     Surgical History: Past Surgical History:  Procedure Laterality Date  . ABDOMINAL SURGERY    . ADENOIDECTOMY    . CARPAL TUNNEL RELEASE Right   . corpal tunnel Right   . ELBOW SURGERY Right    repaired tendon  . hemmorhoid N/A   . hemorrh    . NASAL SINUS SURGERY    . NOSE SURGERY    .  reflux surgery    . ROTATOR CUFF REPAIR Right   . SHOULDER SURGERY    . TENNIS ELBOW RELEASE/NIRSCHEL PROCEDURE Right   . URETHRA SURGERY     surgery 6 times from age [age] yrs old  . URETHRA SURGERY      Medical History: Past Medical History:  Diagnosis Date  . Allergy   . Anxiety   . Anxiety   . Asthma   . Chronic diastolic CHF (congestive heart failure) ([place])    a. echo 07/2013: EF 60-65%, DD, biatrial dilatation, Ao sclerosis,  dilated RV, moderate pulmonary HTN, elevated CV and RA pressures; b. patient reported echo at Dr. Laurelyn Sickle office 02/2015 - his office does not have record of him being a pt there c. echo 11/2015: EF 60-65%, Grade 1 DD, mod-severe pulm pressures  . Chronic respiratory failure (H[facility])    a. on 2L via nasal cannula; b. secondary to COPD  . COPD (chronic obstructive pulmonary disease) ([place])   . Depression   . Depression   . Depression   . Emphysema of lung ([place])   . GERD (gastroesophageal reflux disease)   . Hypertension   . Personal history of tobacco use, presenting hazards to health 08/17/2015  . Tobacco abuse     Family History: Family History  Problem Relation Age of Onset  . CAD Father   . Hyperlipidemia Father   . Stroke Father   . Heart disease Father   . Hypertension Mother   . Peripheral Artery Disease Mother   . Rheum arthritis Mother   . Asthma Mother   . Bipolar disorder Mother   . Depression Mother   . Malignant hypertension Mother   . Diabetes Neg Hx     Social History: Social History   Socioeconomic History  . Marital status: Legally Separated    Spouse name: Not on file  . Number of children: Not on file  . Years of education: Not on file  . Highest education level: Not on file  Social Needs  . Financial resource strain: Not on file  . Food insecurity - worry: Not on file  . Food insecurity - inability: Not on file  . Transportation needs - medical: Not on file  . Transportation needs - non-medical: Not on file  Occupational History  . Not on file  Tobacco Use  . Smoking status: Former Smoker    Packs/day: 0.10    Years: 43.00    Pack years: 4.30    Types: E-cigarettes, Cigarettes  . Smokeless tobacco: Never Used  Substance and Sexual Activity  . Alcohol use: No  . Drug use: No  . Sexual activity: Not on file  Other Topics Concern  . Not on file  Social History Narrative  . Not on file    Vital Signs: Blood pressure 112/79, pulse (!) 108,  resp. rate 16, height _0  (1.803 m), weight 222 lb 3.2 oz (100.8 kg), SpO2 93 %.  Examination: General Appearance: The patient is well-developed, well-nourished, and in no distress. Skin: Gross inspection of skin unremarkable. Head: normocephalic, no gross deformities. Eyes: no gross deformities noted. ENT: ears appear grossly normal no exudates. Neck: Supple. No thyromegaly. No LAD. Respiratory: coars rhonchi noted bilaterally. Cardiovascular: Normal S1 and S2 without murmur or rub. Extremities: No cyanosis. pulses are equal. Neurologic: Alert and oriented. No involuntary movements.  LABS: Recent Results (from the past 2160 hour(s))  Comprehensive metabolic panel     Status: Abnormal   Collection Time: 08/06/17  4:50 PM  Result Value Ref Range   Sodium 138 135 - 145 mmol/L   Potassium 4.0 3.5 - 5.1 mmol/L   Chloride 99 (L) 101 - 111 mmol/L   CO2 30 22 - 32 mmol/L   Glucose, Bld 113 (H) 65 - 99 mg/dL   BUN 10 6 - 20 mg/dL   Creatinine, Ser 1.27 (H) 0.61 - 1.24 mg/dL   Calcium 9.1 8.9 - 10.3 mg/dL   Total Protein 6.7 6.5 - 8.1 g/dL   Albumin 4.0 3.5 - 5.0 g/dL   AST 29 15 - 41 U/L   ALT 21 17 - 63 U/L   Alkaline Phosphatase 61 38 - 126 U/L   Total Bilirubin 0.5 0.3 - 1.2 mg/dL   GFR calc non Af Amer 58 (L) >60 mL/min   GFR calc Af Amer >60 >60 mL/min    Comment: (NOTE) The eGFR has been calculated using the CKD EPI equation. This calculation has not been validated in all clinical situations. eGFR's persistently <60 mL/min signify possible Chronic Kidney Disease.    Anion gap 9 5 - 15  Lactic acid, plasma     Status: None   Collection Time: 08/06/17  4:50 PM  Result Value Ref Range   Lactic Acid, Venous 1.0 0.5 - 1.9 mmol/L  CBC with Differential     Status: Abnormal   Collection Time: 08/06/17  4:50 PM  Result Value Ref Range   WBC 5.3 3.8 - 10.6 K/uL   RBC 4.15 (L) 4.40 - 5.90 MIL/uL   Hemoglobin 13.6 13.0 - 18.0 g/dL   HCT 40.3 40.0 - 52.0 %   MCV 97.0 80.0 -  100.0 fL   MCH 32.7 26.0 - 34.0 pg   MCHC 33.7 32.0 - 36.0 g/dL   RDW 14.9 (H) 11.5 - 14.5 %   Platelets 202 150 - 440 K/uL   Neutrophils Relative % 64 %   Neutro Abs 3.4 1.4 - 6.5 K/uL   Lymphocytes Relative 22 %   Lymphs Abs 1.2 1.0 - 3.6 K/uL   Monocytes Relative 12 %   Monocytes Absolute 0.6 0.2 - 1.0 K/uL   Eosinophils Relative 1 %   Eosinophils Absolute 0.0 0 - 0.7 K/uL   Basophils Relative 1 %   Basophils Absolute 0.0 0 - 0.1 K/uL  Protime-INR     Status: None   Collection Time: 08/06/17  4:50 PM  Result Value Ref Range   Prothrombin Time 13.3 11.4 - 15.2 seconds   INR 1.02   Culture, blood (Routine x 2)     Status: None   Collection Time: 08/06/17  4:50 PM  Result Value Ref Range   Specimen Description BLOOD LEFT HAND    Special Requests      BOTTLES DRAWN AEROBIC AND ANAEROBIC Blood Culture results may not be optimal due to an excessive volume of blood received in culture bottles   Culture NO GROWTH 5 DAYS    Report Status 08/11/2017 FINAL   Culture, blood (Routine x 2)     Status: None   Collection Time: 08/06/17  4:50 PM  Result Value Ref Range   Specimen Description BLOOD RIGHT HAND    Special Requests      BOTTLES DRAWN AEROBIC AND ANAEROBIC Blood Culture results may not be optimal due to an excessive volume of blood received in culture bottles   Culture NO GROWTH 5 DAYS    Report Status 08/11/2017 FINAL   Procalcitonin - Baseline  Status: None   Collection Time: 08/06/17  4:50 PM  Result Value Ref Range   Procalcitonin <0.10 ng/mL    Comment:        Interpretation: PCT (Procalcitonin) <= 0.5 ng/mL: Systemic infection (sepsis) is not likely. Local bacterial infection is possible. (NOTE)         ICU PCT Algorithm               Non ICU PCT Algorithm    ----------------------------     ------------------------------         PCT < 0.25 ng/mL                 PCT < 0.1 ng/mL     Stopping of antibiotics            Stopping of antibiotics       strongly  encouraged.               strongly encouraged.    ----------------------------     ------------------------------       PCT level decrease by               PCT < 0.25 ng/mL       >= 80% from peak PCT       OR PCT 0.25 - 0.5 ng/mL          Stopping of antibiotics                                             encouraged.     Stopping of antibiotics           encouraged.    ----------------------------     ------------------------------       PCT level decrease by              PCT >= 0.25 ng/mL       < 80% from peak PCT        AND PCT >= 0.5 ng/mL            Continuin g antibiotics                                              encouraged.       Continuing antibiotics            encouraged.    ----------------------------     ------------------------------     PCT level increase compared          PCT > 0.5 ng/mL         with peak PCT AND          PCT >= 0.5 ng/mL             Escalation of antibiotics                                          strongly encouraged.      Escalation of antibiotics        strongly encouraged.   Theophylline level     Status: Abnormal   Collection Time: 08/06/17  4:57 PM  Result Value Ref Range   Theophylline Lvl 5.0 (L) 10.0 -  20.0 ug/mL    Comment: (NOTE)                                Detection Limit =  0.9                          <0.9 indicates None Detected Performed At: [facility] [address] [place], [place] [id number] Rush Farmer MD [id number]   Urinalysis, Complete w Microscopic     Status: Abnormal   Collection Time: 08/06/17  6:30 PM  Result Value Ref Range   Color, Urine STRAW (A) YELLOW   APPearance CLEAR (A) CLEAR   Specific Gravity, Urine 1.005 1.005 - 1.030   pH 7.0 5.0 - 8.0   Glucose, UA NEGATIVE NEGATIVE mg/dL   Hgb urine dipstick NEGATIVE NEGATIVE   Bilirubin Urine NEGATIVE NEGATIVE   Ketones, ur NEGATIVE NEGATIVE mg/dL   Protein, ur NEGATIVE NEGATIVE mg/dL   Nitrite NEGATIVE NEGATIVE   Leukocytes, UA NEGATIVE  NEGATIVE   RBC / HPF NONE SEEN 0 - 5 RBC/hpf   WBC, UA 0-5 0 - 5 WBC/hpf   Bacteria, UA NONE SEEN NONE SEEN   Squamous Epithelial / LPF 0-5 (A) NONE SEEN  Lactic acid, plasma     Status: Abnormal   Collection Time: 08/06/17  8:27 PM  Result Value Ref Range   Lactic Acid, Venous 2.7 (HH) 0.5 - 1.9 mmol/L    Comment: CRITICAL RESULT CALLED TO, READ BACK BY AND VERIFIED WITH DEBORAH GLADDING 08/06/17 @ 2114  [place]   Procalcitonin     Status: None   Collection Time: 08/07/17 12:08 AM  Result Value Ref Range   Procalcitonin <0.10 ng/mL    Comment:        Interpretation: PCT (Procalcitonin) <= 0.5 ng/mL: Systemic infection (sepsis) is not likely. Local bacterial infection is possible. (NOTE)         ICU PCT Algorithm               Non ICU PCT Algorithm    ----------------------------     ------------------------------         PCT < 0.25 ng/mL                 PCT < 0.1 ng/mL     Stopping of antibiotics            Stopping of antibiotics       strongly encouraged.               strongly encouraged.    ----------------------------     ------------------------------       PCT level decrease by               PCT < 0.25 ng/mL       >= 80% from peak PCT       OR PCT 0.25 - 0.5 ng/mL          Stopping of antibiotics                                             encouraged.     Stopping of antibiotics           encouraged.    ----------------------------     ------------------------------  PCT level decrease by              PCT >= 0.25 ng/mL       < 80% from peak PCT        AND PCT >= 0.5 ng/mL            Continuin g antibiotics                                              encouraged.       Continuing antibiotics            encouraged.    ----------------------------     ------------------------------     PCT level increase compared          PCT > 0.5 ng/mL         with peak PCT AND          PCT >= 0.5 ng/mL             Escalation of antibiotics                                           strongly encouraged.      Escalation of antibiotics        strongly encouraged.   Basic metabolic panel     Status: Abnormal   Collection Time: 08/07/17 12:08 AM  Result Value Ref Range   Sodium 140 135 - 145 mmol/L   Potassium 4.8 3.5 - 5.1 mmol/L   Chloride 104 101 - 111 mmol/L   CO2 28 22 - 32 mmol/L   Glucose, Bld 227 (H) 65 - 99 mg/dL   BUN 13 6 - 20 mg/dL   Creatinine, Ser 1.30 (H) 0.61 - 1.24 mg/dL   Calcium 8.8 (L) 8.9 - 10.3 mg/dL   GFR calc non Af Amer 57 (L) >60 mL/min   GFR calc Af Amer >60 >60 mL/min    Comment: (NOTE) The eGFR has been calculated using the CKD EPI equation. This calculation has not been validated in all clinical situations. eGFR's persistently <60 mL/min signify possible Chronic Kidney Disease.    Anion gap 8 5 - 15  CBC     Status: Abnormal   Collection Time: 08/07/17 12:08 AM  Result Value Ref Range   WBC 4.4 3.8 - 10.6 K/uL   RBC 4.05 (L) 4.40 - 5.90 MIL/uL   Hemoglobin 13.2 13.0 - 18.0 g/dL   HCT 39.3 (L) 40.0 - 52.0 %   MCV 97.1 80.0 - 100.0 fL   MCH 32.5 26.0 - 34.0 pg   MCHC 33.5 32.0 - 36.0 g/dL   RDW 14.7 (H) 11.5 - 14.5 %   Platelets 192 150 - 440 K/uL  ANA w/Reflex if Positive     Status: None   Collection Time: 08/07/17 12:08 AM  Result Value Ref Range   Anit Nuclear Antibody(ANA) Negative Negative    Comment: (NOTE) Performed At: [facility] [place], [place] [id number] Rush Farmer MD [id number]   Rheumatoid factor     Status: None   Collection Time: 08/07/17 12:08 AM  Result Value Ref Range   Rhuematoid fact SerPl-aCnc 11.9 0.0 - 13.9 IU/mL    Comment: (NOTE) Performed At: [facility] [address]  [place], [place] [id number] Rush Farmer MD [id number]   Sedimentation rate     Status: None   Collection Time: 08/07/17 12:08 AM  Result Value Ref Range   Sed Rate 12 0 - 20 mm/hr  HIV antibody     Status: None   Collection Time: 08/07/17 12:08 AM  Result Value Ref Range    HIV Screen 4th Generation wRfx Non Reactive Non Reactive    Comment: (NOTE) Performed At: [facility] [address] [place], [place] [id number] Rush Farmer MD [id number]   Lactic acid, plasma     Status: None   Collection Time: 08/07/17 12:08 AM  Result Value Ref Range   Lactic Acid, Venous 1.7 0.5 - 1.9 mmol/L  Procalcitonin     Status: None   Collection Time: 08/08/17  4:17 AM  Result Value Ref Range   Procalcitonin <0.10 ng/mL    Comment:        Interpretation: PCT (Procalcitonin) <= 0.5 ng/mL: Systemic infection (sepsis) is not likely. Local bacterial infection is possible. (NOTE)         ICU PCT Algorithm               Non ICU PCT Algorithm    ----------------------------     ------------------------------         PCT < 0.25 ng/mL                 PCT < 0.1 ng/mL     Stopping of antibiotics            Stopping of antibiotics       strongly encouraged.               strongly encouraged.    ----------------------------     ------------------------------       PCT level decrease by               PCT < 0.25 ng/mL       >= 80% from peak PCT       OR PCT 0.25 - 0.5 ng/mL          Stopping of antibiotics                                             encouraged.     Stopping of antibiotics           encouraged.    ----------------------------     ------------------------------       PCT level decrease by              PCT >= 0.25 ng/mL       < 80% from peak PCT        AND PCT >= 0.5 ng/mL            Continuin g antibiotics                                              encouraged.       Continuing antibiotics            encouraged.    ----------------------------     ------------------------------     PCT level increase compared          PCT > 0.5 ng/mL  with peak PCT AND          PCT >= 0.5 ng/mL             Escalation of antibiotics                                          strongly encouraged.      Escalation of antibiotics        strongly encouraged.    Basic metabolic panel     Status: Abnormal   Collection Time: 08/11/17  8:32 AM  Result Value Ref Range   Sodium 139 135 - 145 mmol/L   Potassium 4.6 3.5 - 5.1 mmol/L   Chloride 100 (L) 101 - 111 mmol/L   CO2 31 22 - 32 mmol/L   Glucose, Bld 120 (H) 65 - 99 mg/dL   BUN 31 (H) 6 - 20 mg/dL   Creatinine, Ser 1.07 0.61 - 1.24 mg/dL   Calcium 9.2 8.9 - 10.3 mg/dL   GFR calc non Af Amer >60 >60 mL/min   GFR calc Af Amer >60 >60 mL/min    Comment: (NOTE) The eGFR has been calculated using the CKD EPI equation. This calculation has not been validated in all clinical situations. eGFR's persistently <60 mL/min signify possible Chronic Kidney Disease.    Anion gap 8 5 - 15  Creatinine, serum     Status: None   Collection Time: 08/13/17  5:18 AM  Result Value Ref Range   Creatinine, Ser 1.07 0.61 - 1.24 mg/dL   GFR calc non Af Amer >60 >60 mL/min   GFR calc Af Amer >60 >60 mL/min    Comment: (NOTE) The eGFR has been calculated using the CKD EPI equation. This calculation has not been validated in all clinical situations. eGFR's persistently <60 mL/min signify possible Chronic Kidney Disease.   Troponin I     Status: None   Collection Time: 09/18/17  8:46 PM  Result Value Ref Range   Troponin I <0.03 <0.03 ng/mL    Comment: Performed at [facility], [address] [place], [place] [address]  CBC     Status: Abnormal   Collection Time: 09/18/17  8:46 PM  Result Value Ref Range   WBC 16.7 (H) 3.8 - 10.6 K/uL   RBC 4.34 (L) 4.40 - 5.90 MIL/uL   Hemoglobin 13.9 13.0 - 18.0 g/dL   HCT 43.0 40.0 - 52.0 %   MCV 99.3 80.0 - 100.0 fL   MCH 32.0 26.0 - 34.0 pg   MCHC 32.3 32.0 - 36.0 g/dL   RDW 14.5 11.5 - 14.5 %   Platelets 217 150 - 440 K/uL    Comment: Performed at [facility], [address] [place], [place] [address]  Basic metabolic panel     Status: Abnormal   Collection Time: 09/18/17  8:46 PM  Result Value Ref Range   Sodium 144 135 - 145 mmol/L    Potassium 4.3 3.5 - 5.1 mmol/L   Chloride 101 101 - 111 mmol/L   CO2 32 22 - 32 mmol/L   Glucose, Bld 126 (H) 65 - 99 mg/dL   BUN 11 6 - 20 mg/dL   Creatinine, Ser 1.08 0.61 - 1.24 mg/dL   Calcium 9.5 8.9 - 10.3 mg/dL   GFR calc non Af Amer >60 >60 mL/min   GFR calc Af Amer >60 >60 mL/min  Comment: (NOTE) The eGFR has been calculated using the CKD EPI equation. This calculation has not been validated in all clinical situations. eGFR's persistently <60 mL/min signify possible Chronic Kidney Disease.    Anion gap 11 5 - 15    Comment: Performed at [facility], [address] [place], [place] [address]  TSH     Status: None   Collection Time: 09/18/17  8:46 PM  Result Value Ref Range   TSH 1.179 0.350 - 4.500 uIU/mL    Comment: Performed by a 3rd Generation assay with a functional sensitivity of <=0.01 uIU/mL. Performed at [facility], [address] [place], [place] [address]   Hemoglobin A1c     Status: None   Collection Time: 09/18/17  8:46 PM  Result Value Ref Range   Hgb A1c MFr Bld 5.4 4.8 - 5.6 %    Comment: (NOTE) Pre diabetes:          5.7%-6.4% Diabetes:              >6.4% Glycemic control for   <7.0% adults with diabetes    Mean Plasma Glucose 108.28 mg/dL    Comment: Performed at [facility] [address] [place], [place] [address]  Influenza panel by PCR (type A & B)     Status: None   Collection Time: 09/19/17 12:27 AM  Result Value Ref Range   Influenza A By PCR NEGATIVE NEGATIVE   Influenza B By PCR NEGATIVE NEGATIVE    Comment: (NOTE) The Xpert Xpress Flu assay is intended as an aid in the diagnosis of  influenza and should not be used as a sole basis for treatment.  This  assay is FDA approved for nasopharyngeal swab specimens only. Nasal  washings and aspirates are unacceptable for Xpert Xpress Flu testing. Performed at [facility], [address] [place], [place] [address]   Culture, expectorated  sputum-assessment     Status: None   Collection Time: 09/19/17 12:27 AM  Result Value Ref Range   Specimen Description SPUTUM    Special Requests NONE    Sputum evaluation      THIS SPECIMEN IS ACCEPTABLE FOR SPUTUM CULTURE Performed at [facility], [address], [place], [place] [address]    Report Status 09/19/2017 FINAL   Culture, respiratory (NON-Expectorated)     Status: None   Collection Time: 09/19/17 12:27 AM  Result Value Ref Range   Specimen Description      SPUTUM Performed at [facility], [address], [place], [place] [address]    Special Requests      NONE Reflexed from [phone] Performed at [facility], [place], [place] [address]    Gram Stain      FEW WBC PRESENT, PREDOMINANTLY PMN RARE GRAM NEGATIVE RODS    Culture      ABUNDANT HAEMOPHILUS INFLUENZAE BETA LACTAMASE NEGATIVE Performed at [facility], [place] [address], [place],  [address]    Report Status 09/21/2017 FINAL   Blood culture (routine x 2)     Status: None   Collection Time: 09/19/17  2:03 AM  Result Value Ref Range   Specimen Description BLOOD RESISTANT WRIST    Special Requests      BOTTLES DRAWN AEROBIC AND ANAEROBIC Blood Culture adequate volume   Culture      NO GROWTH 5 DAYS Performed at [name], [address] [place],  [address]  Report Status 09/24/2017 FINAL   Lactic acid, plasma     Status: None   Collection Time: 09/19/17  2:03 AM  Result Value Ref Range   Lactic Acid, Venous 1.1 0.5 - 1.9 mmol/L    Comment: Performed at [facility], [address] [place], [place] [address]  Blood culture (routine x 2)     Status: None   Collection Time: 09/19/17  2:14 AM  Result Value Ref Range   Specimen Description BLOOD LEFT WRIST    Special Requests      BOTTLES DRAWN AEROBIC AND ANAEROBIC Blood Culture adequate volume   Culture      NO GROWTH 5 DAYS Performed at [facility],  [address] [place], [place] [address]    Report Status 09/24/2017 FINAL   MRSA PCR Screening     Status: None   Collection Time: 09/19/17  4:56 AM  Result Value Ref Range   MRSA by PCR NEGATIVE NEGATIVE    Comment:        The GeneXpert MRSA Assay (FDA approved for NASAL specimens only), is one component of a comprehensive MRSA colonization surveillance program. It is not intended to diagnose MRSA infection nor to guide or monitor treatment for MRSA infections. Performed at [facility], [address] [place], [place] [address]   Glucose, capillary     Status: Abnormal   Collection Time: 09/19/17  5:07 AM  Result Value Ref Range   Glucose-Capillary 111 (H) 65 - 99 mg/dL   Comment 1 Notify RN    Comment 2 Document in Chart   Lactic acid, plasma     Status: None   Collection Time: 09/19/17  5:47 AM  Result Value Ref Range   Lactic Acid, Venous 1.2 0.5 - 1.9 mmol/L    Comment: Performed at [facility], [address] [place], [place] [address]  CBC     Status: Abnormal   Collection Time: 09/20/17  7:43 AM  Result Value Ref Range   WBC 15.5 (H) 3.8 - 10.6 K/uL   RBC 3.70 (L) 4.40 - 5.90 MIL/uL   Hemoglobin 11.8 (L) 13.0 - 18.0 g/dL   HCT 36.5 (L) 40.0 - 52.0 %   MCV 98.8 80.0 - 100.0 fL   MCH 32.1 26.0 - 34.0 pg   MCHC 32.4 32.0 - 36.0 g/dL   RDW 14.1 11.5 - 14.5 %   Platelets 174 150 - 440 K/uL    Comment: Performed at [facility], [address] [place], [place] [address]  CBC     Status: Abnormal   Collection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

## 2017-10-11 NOTE — Patient Instructions (Signed)

## 2017-10-12 ENCOUNTER — Other Ambulatory Visit: Payer: Self-pay | Admitting: Pharmacist

## 2017-10-12 ENCOUNTER — Ambulatory Visit: Payer: Self-pay | Admitting: Pharmacist

## 2017-10-12 DIAGNOSIS — Z9981 Dependence on supplemental oxygen: Secondary | ICD-10-CM | POA: Diagnosis not present

## 2017-10-12 DIAGNOSIS — J189 Pneumonia, unspecified organism: Secondary | ICD-10-CM | POA: Diagnosis not present

## 2017-10-12 DIAGNOSIS — J441 Chronic obstructive pulmonary disease with (acute) exacerbation: Secondary | ICD-10-CM | POA: Diagnosis not present

## 2017-10-12 DIAGNOSIS — J45909 Unspecified asthma, uncomplicated: Secondary | ICD-10-CM | POA: Diagnosis not present

## 2017-10-12 DIAGNOSIS — J440 Chronic obstructive pulmonary disease with acute lower respiratory infection: Secondary | ICD-10-CM | POA: Diagnosis not present

## 2017-10-12 DIAGNOSIS — Z720 Tobacco use: Secondary | ICD-10-CM | POA: Diagnosis not present

## 2017-10-12 DIAGNOSIS — I110 Hypertensive heart disease with heart failure: Secondary | ICD-10-CM | POA: Diagnosis not present

## 2017-10-12 DIAGNOSIS — I5032 Chronic diastolic (congestive) heart failure: Secondary | ICD-10-CM | POA: Diagnosis not present

## 2017-10-12 DIAGNOSIS — J9611 Chronic respiratory failure with hypoxia: Secondary | ICD-10-CM | POA: Diagnosis not present

## 2017-10-12 DIAGNOSIS — Z7951 Long term (current) use of inhaled steroids: Secondary | ICD-10-CM | POA: Diagnosis not present

## 2017-10-12 NOTE — Patient Outreach (Signed)
[name]) Care Management  10/12/2017  Dustin [name] [birth year] [id number]  Successful call placed to Dr. Milta DeitersKhan's office to speak with Dustin [name] who is assisting Dustin [name] with patient assistance applications for Spiriva and Dulera.  Applications have not been submitted yet as Dustin [name] has not provided TROOP or financial documents yet.   Office is also submitting an application for [employer].  Office has samples of inhalers if patient runs out of current supply.   Successful call placed to Dustin [name]. HIPAA identifiers verified. Dustin [name] states that he had a bad week but started to feel better yesterday and today.  He reports that he needs to work on remembering to breathe when he starts walking.  He reports that he is going to try to bring in required documents for PAP applications in the next week.  I relayed message about office having sample inhalers if he needs them and patient states that he may request these.   Plan: I will follow-up with Dustin [name] as scheduled on November 09, 2017 regarding Extra Help application.  I will let Dustin [name] and Dr. Milta DeitersKhan's office work together on the other patient assistance applications but am happy to assist in any way to make process easier if needed.    Dustin [name], PharmD, [facility]no[facility]PS Clinical Pharmacist [facility] [phone]

## 2017-10-13 DIAGNOSIS — I110 Hypertensive heart disease with heart failure: Secondary | ICD-10-CM | POA: Diagnosis not present

## 2017-10-13 DIAGNOSIS — J9611 Chronic respiratory failure with hypoxia: Secondary | ICD-10-CM | POA: Diagnosis not present

## 2017-10-13 DIAGNOSIS — J45909 Unspecified asthma, uncomplicated: Secondary | ICD-10-CM | POA: Diagnosis not present

## 2017-10-13 DIAGNOSIS — Z7951 Long term (current) use of inhaled steroids: Secondary | ICD-10-CM | POA: Diagnosis not present

## 2017-10-13 DIAGNOSIS — J440 Chronic obstructive pulmonary disease with acute lower respiratory infection: Secondary | ICD-10-CM | POA: Diagnosis not present

## 2017-10-13 DIAGNOSIS — Z9981 Dependence on supplemental oxygen: Secondary | ICD-10-CM | POA: Diagnosis not present

## 2017-10-13 DIAGNOSIS — I5032 Chronic diastolic (congestive) heart failure: Secondary | ICD-10-CM | POA: Diagnosis not present

## 2017-10-13 DIAGNOSIS — J189 Pneumonia, unspecified organism: Secondary | ICD-10-CM | POA: Diagnosis not present

## 2017-10-13 DIAGNOSIS — J441 Chronic obstructive pulmonary disease with (acute) exacerbation: Secondary | ICD-10-CM | POA: Diagnosis not present

## 2017-10-13 DIAGNOSIS — Z720 Tobacco use: Secondary | ICD-10-CM | POA: Diagnosis not present

## 2017-10-15 ENCOUNTER — Other Ambulatory Visit: Payer: Self-pay | Admitting: *Deleted

## 2017-10-15 DIAGNOSIS — I5032 Chronic diastolic (congestive) heart failure: Secondary | ICD-10-CM | POA: Diagnosis not present

## 2017-10-15 DIAGNOSIS — J9611 Chronic respiratory failure with hypoxia: Secondary | ICD-10-CM | POA: Diagnosis not present

## 2017-10-15 DIAGNOSIS — Z7951 Long term (current) use of inhaled steroids: Secondary | ICD-10-CM | POA: Diagnosis not present

## 2017-10-15 DIAGNOSIS — J441 Chronic obstructive pulmonary disease with (acute) exacerbation: Secondary | ICD-10-CM | POA: Diagnosis not present

## 2017-10-15 DIAGNOSIS — J45909 Unspecified asthma, uncomplicated: Secondary | ICD-10-CM | POA: Diagnosis not present

## 2017-10-15 DIAGNOSIS — I110 Hypertensive heart disease with heart failure: Secondary | ICD-10-CM | POA: Diagnosis not present

## 2017-10-15 DIAGNOSIS — Z720 Tobacco use: Secondary | ICD-10-CM | POA: Diagnosis not present

## 2017-10-15 DIAGNOSIS — J189 Pneumonia, unspecified organism: Secondary | ICD-10-CM | POA: Diagnosis not present

## 2017-10-15 DIAGNOSIS — Z9981 Dependence on supplemental oxygen: Secondary | ICD-10-CM | POA: Diagnosis not present

## 2017-10-15 DIAGNOSIS — J440 Chronic obstructive pulmonary disease with acute lower respiratory infection: Secondary | ICD-10-CM | POA: Diagnosis not present

## 2017-10-15 NOTE — Patient Outreach (Signed)
Telephone call to pt for transition of care week 4, no answer to telephone, left voicemail requesting return phone call.  PLAN Outreach pt next week  Irving ShowsJulie Nelia Rogoff [facility], BSN [facility] Coordinator [phone]

## 2017-10-16 ENCOUNTER — Telehealth: Payer: Self-pay

## 2017-10-16 ENCOUNTER — Other Ambulatory Visit: Payer: Self-pay | Admitting: Pharmacist

## 2017-10-16 ENCOUNTER — Ambulatory Visit: Payer: Self-pay | Admitting: Internal Medicine

## 2017-10-16 DIAGNOSIS — J189 Pneumonia, unspecified organism: Secondary | ICD-10-CM | POA: Diagnosis not present

## 2017-10-16 DIAGNOSIS — J441 Chronic obstructive pulmonary disease with (acute) exacerbation: Secondary | ICD-10-CM | POA: Diagnosis not present

## 2017-10-16 DIAGNOSIS — Z9981 Dependence on supplemental oxygen: Secondary | ICD-10-CM | POA: Diagnosis not present

## 2017-10-16 DIAGNOSIS — J45909 Unspecified asthma, uncomplicated: Secondary | ICD-10-CM | POA: Diagnosis not present

## 2017-10-16 DIAGNOSIS — J9611 Chronic respiratory failure with hypoxia: Secondary | ICD-10-CM | POA: Diagnosis not present

## 2017-10-16 DIAGNOSIS — J440 Chronic obstructive pulmonary disease with acute lower respiratory infection: Secondary | ICD-10-CM | POA: Diagnosis not present

## 2017-10-16 DIAGNOSIS — Z7951 Long term (current) use of inhaled steroids: Secondary | ICD-10-CM | POA: Diagnosis not present

## 2017-10-16 DIAGNOSIS — Z720 Tobacco use: Secondary | ICD-10-CM | POA: Diagnosis not present

## 2017-10-16 DIAGNOSIS — I110 Hypertensive heart disease with heart failure: Secondary | ICD-10-CM | POA: Diagnosis not present

## 2017-10-16 DIAGNOSIS — I5032 Chronic diastolic (congestive) heart failure: Secondary | ICD-10-CM | POA: Diagnosis not present

## 2017-10-16 NOTE — Patient Outreach (Signed)
[name]) Care Management  10/16/2017  Hershal CoriaDennis R Rincon [birth year] [id number]  Incoming call received from Mr. Mccormac. HIPAA identifiers verified. [name] reports he received a letter from [employer] stating he had already been approved for Extra Help and was currently receiving reduced co-pays. [name] was not aware that his co-pays had been reduced.   [facility]  Proair albuterol inhaler = $9.84.  Spiriva has not been filled therefore co-pay not know.  Based on albuterol co-pay, patient appears to have partial Extra Help which is a 15% co-insurance.    Successful return call to Mr. Ahlin.  [name] expressed understanding of partial Extra Help.  He will follow through with obtaining financial documents for PAP applications and submit these through Dr. Milta DeitersKhan's office.    [name] does not have any other medication questions or concerns at this time.  He has my phone number in case he needs to reach out to me in the future.    Plan: I will close [name]'s pharmacy case at this time.  I am happy to assist in the future as warranted. I will alert other [facility] CM staff of pharmacy case closure.   Haynes Hoehnolleen Kaelan Amble, PharmD, [facility] Clinical Pharmacist [facility] [phone]

## 2017-10-16 NOTE — Telephone Encounter (Signed)
[facility] home nurse stephanie called [phone] mr Novakovich gain 5 pound as per dr Welton Flakeskhan increase lasix 2 day for 2 days  And go to normal and aldo gave verbal order for home aide for twice a week for 2 weeks

## 2017-10-17 ENCOUNTER — Ambulatory Visit: Payer: Self-pay | Admitting: Internal Medicine

## 2017-10-17 DIAGNOSIS — J440 Chronic obstructive pulmonary disease with acute lower respiratory infection: Secondary | ICD-10-CM | POA: Diagnosis not present

## 2017-10-17 DIAGNOSIS — I110 Hypertensive heart disease with heart failure: Secondary | ICD-10-CM | POA: Diagnosis not present

## 2017-10-17 DIAGNOSIS — I5032 Chronic diastolic (congestive) heart failure: Secondary | ICD-10-CM | POA: Diagnosis not present

## 2017-10-17 DIAGNOSIS — Z9981 Dependence on supplemental oxygen: Secondary | ICD-10-CM | POA: Diagnosis not present

## 2017-10-17 DIAGNOSIS — Z7951 Long term (current) use of inhaled steroids: Secondary | ICD-10-CM | POA: Diagnosis not present

## 2017-10-17 DIAGNOSIS — Z720 Tobacco use: Secondary | ICD-10-CM | POA: Diagnosis not present

## 2017-10-17 DIAGNOSIS — J45909 Unspecified asthma, uncomplicated: Secondary | ICD-10-CM | POA: Diagnosis not present

## 2017-10-17 DIAGNOSIS — J189 Pneumonia, unspecified organism: Secondary | ICD-10-CM | POA: Diagnosis not present

## 2017-10-17 DIAGNOSIS — J9611 Chronic respiratory failure with hypoxia: Secondary | ICD-10-CM | POA: Diagnosis not present

## 2017-10-17 DIAGNOSIS — J441 Chronic obstructive pulmonary disease with (acute) exacerbation: Secondary | ICD-10-CM | POA: Diagnosis not present

## 2017-10-18 ENCOUNTER — Ambulatory Visit: Payer: Self-pay | Admitting: Internal Medicine

## 2017-10-18 DIAGNOSIS — I5032 Chronic diastolic (congestive) heart failure: Secondary | ICD-10-CM | POA: Diagnosis not present

## 2017-10-18 DIAGNOSIS — J189 Pneumonia, unspecified organism: Secondary | ICD-10-CM | POA: Diagnosis not present

## 2017-10-18 DIAGNOSIS — J440 Chronic obstructive pulmonary disease with acute lower respiratory infection: Secondary | ICD-10-CM | POA: Diagnosis not present

## 2017-10-18 DIAGNOSIS — J441 Chronic obstructive pulmonary disease with (acute) exacerbation: Secondary | ICD-10-CM | POA: Diagnosis not present

## 2017-10-18 DIAGNOSIS — Z720 Tobacco use: Secondary | ICD-10-CM | POA: Diagnosis not present

## 2017-10-18 DIAGNOSIS — Z7951 Long term (current) use of inhaled steroids: Secondary | ICD-10-CM | POA: Diagnosis not present

## 2017-10-18 DIAGNOSIS — Z9981 Dependence on supplemental oxygen: Secondary | ICD-10-CM | POA: Diagnosis not present

## 2017-10-18 DIAGNOSIS — I110 Hypertensive heart disease with heart failure: Secondary | ICD-10-CM | POA: Diagnosis not present

## 2017-10-18 DIAGNOSIS — J45909 Unspecified asthma, uncomplicated: Secondary | ICD-10-CM | POA: Diagnosis not present

## 2017-10-18 DIAGNOSIS — J9611 Chronic respiratory failure with hypoxia: Secondary | ICD-10-CM | POA: Diagnosis not present

## 2017-10-19 ENCOUNTER — Ambulatory Visit
Admission: RE | Admit: 2017-10-19 | Discharge: 2017-10-19 | Disposition: A | Discharge status: Home / Self Care | Payer: Medicare Other | Source: Ambulatory Visit | Attending: Internal Medicine | Admitting: Internal Medicine

## 2017-10-19 ENCOUNTER — Other Ambulatory Visit: Payer: Self-pay | Admitting: *Deleted

## 2017-10-19 DIAGNOSIS — J9611 Chronic respiratory failure with hypoxia: Secondary | ICD-10-CM | POA: Diagnosis not present

## 2017-10-19 DIAGNOSIS — J181 Lobar pneumonia, unspecified organism: Secondary | ICD-10-CM | POA: Insufficient documentation

## 2017-10-19 DIAGNOSIS — J441 Chronic obstructive pulmonary disease with (acute) exacerbation: Secondary | ICD-10-CM | POA: Diagnosis not present

## 2017-10-19 DIAGNOSIS — R0602 Shortness of breath: Secondary | ICD-10-CM | POA: Diagnosis not present

## 2017-10-19 DIAGNOSIS — J440 Chronic obstructive pulmonary disease with acute lower respiratory infection: Secondary | ICD-10-CM | POA: Insufficient documentation

## 2017-10-19 DIAGNOSIS — J449 Chronic obstructive pulmonary disease, unspecified: Secondary | ICD-10-CM | POA: Diagnosis not present

## 2017-10-19 DIAGNOSIS — Z9981 Dependence on supplemental oxygen: Secondary | ICD-10-CM | POA: Diagnosis not present

## 2017-10-19 DIAGNOSIS — J45909 Unspecified asthma, uncomplicated: Secondary | ICD-10-CM | POA: Diagnosis not present

## 2017-10-19 DIAGNOSIS — Z7951 Long term (current) use of inhaled steroids: Secondary | ICD-10-CM | POA: Diagnosis not present

## 2017-10-19 DIAGNOSIS — J189 Pneumonia, unspecified organism: Secondary | ICD-10-CM | POA: Diagnosis not present

## 2017-10-19 DIAGNOSIS — I110 Hypertensive heart disease with heart failure: Secondary | ICD-10-CM | POA: Diagnosis not present

## 2017-10-19 DIAGNOSIS — I5032 Chronic diastolic (congestive) heart failure: Secondary | ICD-10-CM | POA: Diagnosis not present

## 2017-10-19 DIAGNOSIS — Z720 Tobacco use: Secondary | ICD-10-CM | POA: Diagnosis not present

## 2017-10-19 DIAGNOSIS — R05 Cough: Secondary | ICD-10-CM | POA: Diagnosis not present

## 2017-10-19 NOTE — Patient Outreach (Addendum)
[name]) Care Management  10/19/2017  Dustin CoriaDennis R Parmelee 06-Oct-1955 [id number]   CSW called & spoke with patient to follow-up on community resources & housing. Patient states that he has not had a chance to complete and submit the application to the 2nd apartment complex, [place] but that he also has not heard [facility] yet [facility]. CSW will follow-up with [facility] in 1 month if still no response. CSW also spoke with patient about advance directives packet, but patient states that he has been feeling "a little under the weather" and is waiting to hear back from his doctors office whether it is pneumonia that he has. Patient is thankful though that he is still living with his son & daughter-in-law since they are available to help take care of him as he is sick.   CSW will follow-up with patient in 1 month.    [name], [name] [name]  Clinical Social Worker cell #: [phone]

## 2017-10-22 ENCOUNTER — Other Ambulatory Visit: Payer: Self-pay | Admitting: *Deleted

## 2017-10-22 DIAGNOSIS — J189 Pneumonia, unspecified organism: Secondary | ICD-10-CM | POA: Diagnosis not present

## 2017-10-22 DIAGNOSIS — I5032 Chronic diastolic (congestive) heart failure: Secondary | ICD-10-CM | POA: Diagnosis not present

## 2017-10-22 DIAGNOSIS — J440 Chronic obstructive pulmonary disease with acute lower respiratory infection: Secondary | ICD-10-CM | POA: Diagnosis not present

## 2017-10-22 DIAGNOSIS — J441 Chronic obstructive pulmonary disease with (acute) exacerbation: Secondary | ICD-10-CM | POA: Diagnosis not present

## 2017-10-22 DIAGNOSIS — I110 Hypertensive heart disease with heart failure: Secondary | ICD-10-CM | POA: Diagnosis not present

## 2017-10-22 DIAGNOSIS — J45909 Unspecified asthma, uncomplicated: Secondary | ICD-10-CM | POA: Diagnosis not present

## 2017-10-22 DIAGNOSIS — Z7951 Long term (current) use of inhaled steroids: Secondary | ICD-10-CM | POA: Diagnosis not present

## 2017-10-22 DIAGNOSIS — J9611 Chronic respiratory failure with hypoxia: Secondary | ICD-10-CM | POA: Diagnosis not present

## 2017-10-22 DIAGNOSIS — Z9981 Dependence on supplemental oxygen: Secondary | ICD-10-CM | POA: Diagnosis not present

## 2017-10-22 DIAGNOSIS — Z720 Tobacco use: Secondary | ICD-10-CM | POA: Diagnosis not present

## 2017-10-22 NOTE — Patient Outreach (Signed)
Telephone call to patient to schedule home visit, spoke with pt, HIPAA verified, home visit scheduled for week of 11/05/17.  Pt states he continues to have home health, has all medications and taking as prescribed, reports breathing " is okay", states he wants to move into different housing and this is his main concern at present, has placed applications,  [facility] CSW is assisting pt.  PLAN See pt for home visit this month  Irving ShowsJulie Milanie Rosenfield [facility] CenterRNC, BSN [facility] Care Coordinator [phone]

## 2017-10-23 ENCOUNTER — Telehealth: Payer: Self-pay | Admitting: Internal Medicine

## 2017-10-23 DIAGNOSIS — Z9981 Dependence on supplemental oxygen: Secondary | ICD-10-CM | POA: Diagnosis not present

## 2017-10-23 DIAGNOSIS — J45909 Unspecified asthma, uncomplicated: Secondary | ICD-10-CM | POA: Diagnosis not present

## 2017-10-23 DIAGNOSIS — I110 Hypertensive heart disease with heart failure: Secondary | ICD-10-CM | POA: Diagnosis not present

## 2017-10-23 DIAGNOSIS — I5032 Chronic diastolic (congestive) heart failure: Secondary | ICD-10-CM | POA: Diagnosis not present

## 2017-10-23 DIAGNOSIS — Z720 Tobacco use: Secondary | ICD-10-CM | POA: Diagnosis not present

## 2017-10-23 DIAGNOSIS — Z7951 Long term (current) use of inhaled steroids: Secondary | ICD-10-CM | POA: Diagnosis not present

## 2017-10-23 DIAGNOSIS — J189 Pneumonia, unspecified organism: Secondary | ICD-10-CM | POA: Diagnosis not present

## 2017-10-23 DIAGNOSIS — J9611 Chronic respiratory failure with hypoxia: Secondary | ICD-10-CM | POA: Diagnosis not present

## 2017-10-23 DIAGNOSIS — J441 Chronic obstructive pulmonary disease with (acute) exacerbation: Secondary | ICD-10-CM | POA: Diagnosis not present

## 2017-10-23 DIAGNOSIS — J440 Chronic obstructive pulmonary disease with acute lower respiratory infection: Secondary | ICD-10-CM | POA: Diagnosis not present

## 2017-10-23 NOTE — Telephone Encounter (Signed)
SIGNED PLAN OF CARE [name].JW

## 2017-10-24 DIAGNOSIS — Z7951 Long term (current) use of inhaled steroids: Secondary | ICD-10-CM | POA: Diagnosis not present

## 2017-10-24 DIAGNOSIS — J189 Pneumonia, unspecified organism: Secondary | ICD-10-CM | POA: Diagnosis not present

## 2017-10-24 DIAGNOSIS — I110 Hypertensive heart disease with heart failure: Secondary | ICD-10-CM | POA: Diagnosis not present

## 2017-10-24 DIAGNOSIS — J9611 Chronic respiratory failure with hypoxia: Secondary | ICD-10-CM | POA: Diagnosis not present

## 2017-10-24 DIAGNOSIS — Z9981 Dependence on supplemental oxygen: Secondary | ICD-10-CM | POA: Diagnosis not present

## 2017-10-24 DIAGNOSIS — J440 Chronic obstructive pulmonary disease with acute lower respiratory infection: Secondary | ICD-10-CM | POA: Diagnosis not present

## 2017-10-24 DIAGNOSIS — I5032 Chronic diastolic (congestive) heart failure: Secondary | ICD-10-CM | POA: Diagnosis not present

## 2017-10-24 DIAGNOSIS — J441 Chronic obstructive pulmonary disease with (acute) exacerbation: Secondary | ICD-10-CM | POA: Diagnosis not present

## 2017-10-24 DIAGNOSIS — Z720 Tobacco use: Secondary | ICD-10-CM | POA: Diagnosis not present

## 2017-10-24 DIAGNOSIS — J45909 Unspecified asthma, uncomplicated: Secondary | ICD-10-CM | POA: Diagnosis not present

## 2017-10-25 ENCOUNTER — Telehealth: Payer: Self-pay

## 2017-10-25 DIAGNOSIS — Z720 Tobacco use: Secondary | ICD-10-CM | POA: Diagnosis not present

## 2017-10-25 DIAGNOSIS — Z9981 Dependence on supplemental oxygen: Secondary | ICD-10-CM | POA: Diagnosis not present

## 2017-10-25 DIAGNOSIS — J45909 Unspecified asthma, uncomplicated: Secondary | ICD-10-CM | POA: Diagnosis not present

## 2017-10-25 DIAGNOSIS — J9611 Chronic respiratory failure with hypoxia: Secondary | ICD-10-CM | POA: Diagnosis not present

## 2017-10-25 DIAGNOSIS — J441 Chronic obstructive pulmonary disease with (acute) exacerbation: Secondary | ICD-10-CM | POA: Diagnosis not present

## 2017-10-25 DIAGNOSIS — J189 Pneumonia, unspecified organism: Secondary | ICD-10-CM | POA: Diagnosis not present

## 2017-10-25 DIAGNOSIS — I5032 Chronic diastolic (congestive) heart failure: Secondary | ICD-10-CM | POA: Diagnosis not present

## 2017-10-25 DIAGNOSIS — Z7951 Long term (current) use of inhaled steroids: Secondary | ICD-10-CM | POA: Diagnosis not present

## 2017-10-25 DIAGNOSIS — I110 Hypertensive heart disease with heart failure: Secondary | ICD-10-CM | POA: Diagnosis not present

## 2017-10-25 DIAGNOSIS — J440 Chronic obstructive pulmonary disease with acute lower respiratory infection: Secondary | ICD-10-CM | POA: Diagnosis not present

## 2017-10-25 NOTE — Telephone Encounter (Signed)
Stacey from advanced home care called advising that PT and OT had ran out for pt and needs approval for new orders.  Wants to extend PT/OT for 2 x wk for 2 weeks.  Gave verbal ok to go ahead.  dbs

## 2017-10-26 ENCOUNTER — Telehealth: Payer: Self-pay

## 2017-10-26 DIAGNOSIS — Z720 Tobacco use: Secondary | ICD-10-CM | POA: Diagnosis not present

## 2017-10-26 DIAGNOSIS — J45909 Unspecified asthma, uncomplicated: Secondary | ICD-10-CM | POA: Diagnosis not present

## 2017-10-26 DIAGNOSIS — J9611 Chronic respiratory failure with hypoxia: Secondary | ICD-10-CM | POA: Diagnosis not present

## 2017-10-26 DIAGNOSIS — J441 Chronic obstructive pulmonary disease with (acute) exacerbation: Secondary | ICD-10-CM | POA: Diagnosis not present

## 2017-10-26 DIAGNOSIS — J189 Pneumonia, unspecified organism: Secondary | ICD-10-CM | POA: Diagnosis not present

## 2017-10-26 DIAGNOSIS — Z9981 Dependence on supplemental oxygen: Secondary | ICD-10-CM | POA: Diagnosis not present

## 2017-10-26 DIAGNOSIS — J440 Chronic obstructive pulmonary disease with acute lower respiratory infection: Secondary | ICD-10-CM | POA: Diagnosis not present

## 2017-10-26 DIAGNOSIS — I5032 Chronic diastolic (congestive) heart failure: Secondary | ICD-10-CM | POA: Diagnosis not present

## 2017-10-26 DIAGNOSIS — I110 Hypertensive heart disease with heart failure: Secondary | ICD-10-CM | POA: Diagnosis not present

## 2017-10-26 DIAGNOSIS — Z7951 Long term (current) use of inhaled steroids: Secondary | ICD-10-CM | POA: Diagnosis not present

## 2017-10-26 NOTE — Telephone Encounter (Signed)
Dustin SheldonAshley from [facility] called stating that Dustin PhenixDennis [name] has been discharged from skilled nursing. [phone].[place]

## 2017-10-29 ENCOUNTER — Other Ambulatory Visit: Payer: Self-pay | Admitting: Internal Medicine

## 2017-10-29 DIAGNOSIS — Z720 Tobacco use: Secondary | ICD-10-CM | POA: Diagnosis not present

## 2017-10-29 DIAGNOSIS — J9611 Chronic respiratory failure with hypoxia: Secondary | ICD-10-CM | POA: Diagnosis not present

## 2017-10-29 DIAGNOSIS — J441 Chronic obstructive pulmonary disease with (acute) exacerbation: Secondary | ICD-10-CM | POA: Diagnosis not present

## 2017-10-29 DIAGNOSIS — J440 Chronic obstructive pulmonary disease with acute lower respiratory infection: Secondary | ICD-10-CM | POA: Diagnosis not present

## 2017-10-29 DIAGNOSIS — J45909 Unspecified asthma, uncomplicated: Secondary | ICD-10-CM | POA: Diagnosis not present

## 2017-10-29 DIAGNOSIS — Z9981 Dependence on supplemental oxygen: Secondary | ICD-10-CM | POA: Diagnosis not present

## 2017-10-29 DIAGNOSIS — J189 Pneumonia, unspecified organism: Secondary | ICD-10-CM | POA: Diagnosis not present

## 2017-10-29 DIAGNOSIS — I5032 Chronic diastolic (congestive) heart failure: Secondary | ICD-10-CM | POA: Diagnosis not present

## 2017-10-29 DIAGNOSIS — I110 Hypertensive heart disease with heart failure: Secondary | ICD-10-CM | POA: Diagnosis not present

## 2017-10-29 DIAGNOSIS — Z7951 Long term (current) use of inhaled steroids: Secondary | ICD-10-CM | POA: Diagnosis not present

## 2017-10-29 NOTE — Telephone Encounter (Signed)
Last here 7/18  He came  To saw dr Lavera Guisesaadat for fe times this here

## 2017-10-30 ENCOUNTER — Other Ambulatory Visit: Payer: Self-pay | Admitting: Internal Medicine

## 2017-10-30 DIAGNOSIS — Z720 Tobacco use: Secondary | ICD-10-CM | POA: Diagnosis not present

## 2017-10-30 DIAGNOSIS — J440 Chronic obstructive pulmonary disease with acute lower respiratory infection: Secondary | ICD-10-CM | POA: Diagnosis not present

## 2017-10-30 DIAGNOSIS — I110 Hypertensive heart disease with heart failure: Secondary | ICD-10-CM | POA: Diagnosis not present

## 2017-10-30 DIAGNOSIS — J9611 Chronic respiratory failure with hypoxia: Secondary | ICD-10-CM | POA: Diagnosis not present

## 2017-10-30 DIAGNOSIS — J45909 Unspecified asthma, uncomplicated: Secondary | ICD-10-CM | POA: Diagnosis not present

## 2017-10-30 DIAGNOSIS — J441 Chronic obstructive pulmonary disease with (acute) exacerbation: Secondary | ICD-10-CM | POA: Diagnosis not present

## 2017-10-30 DIAGNOSIS — Z9981 Dependence on supplemental oxygen: Secondary | ICD-10-CM | POA: Diagnosis not present

## 2017-10-30 DIAGNOSIS — Z7951 Long term (current) use of inhaled steroids: Secondary | ICD-10-CM | POA: Diagnosis not present

## 2017-10-30 DIAGNOSIS — I5032 Chronic diastolic (congestive) heart failure: Secondary | ICD-10-CM | POA: Diagnosis not present

## 2017-10-30 DIAGNOSIS — J189 Pneumonia, unspecified organism: Secondary | ICD-10-CM | POA: Diagnosis not present

## 2017-10-30 MED ORDER — ALPRAZOLAM 1 MG PO TABS: 1.0000 mg | ORAL_TABLET | Freq: Three times a day (TID) | ORAL | 2 refills | Status: DC | PRN

## 2017-11-01 DIAGNOSIS — I5032 Chronic diastolic (congestive) heart failure: Secondary | ICD-10-CM | POA: Diagnosis not present

## 2017-11-01 DIAGNOSIS — Z7951 Long term (current) use of inhaled steroids: Secondary | ICD-10-CM | POA: Diagnosis not present

## 2017-11-01 DIAGNOSIS — J9611 Chronic respiratory failure with hypoxia: Secondary | ICD-10-CM | POA: Diagnosis not present

## 2017-11-01 DIAGNOSIS — Z720 Tobacco use: Secondary | ICD-10-CM | POA: Diagnosis not present

## 2017-11-01 DIAGNOSIS — I110 Hypertensive heart disease with heart failure: Secondary | ICD-10-CM | POA: Diagnosis not present

## 2017-11-01 DIAGNOSIS — J45909 Unspecified asthma, uncomplicated: Secondary | ICD-10-CM | POA: Diagnosis not present

## 2017-11-01 DIAGNOSIS — J441 Chronic obstructive pulmonary disease with (acute) exacerbation: Secondary | ICD-10-CM | POA: Diagnosis not present

## 2017-11-01 DIAGNOSIS — J189 Pneumonia, unspecified organism: Secondary | ICD-10-CM | POA: Diagnosis not present

## 2017-11-01 DIAGNOSIS — Z9981 Dependence on supplemental oxygen: Secondary | ICD-10-CM | POA: Diagnosis not present

## 2017-11-01 DIAGNOSIS — J440 Chronic obstructive pulmonary disease with acute lower respiratory infection: Secondary | ICD-10-CM | POA: Diagnosis not present

## 2017-11-02 ENCOUNTER — Telehealth: Payer: Self-pay

## 2017-11-02 NOTE — Telephone Encounter (Signed)
We received another refill request on Alprazolam, Dr. Welton FlakesKhan sent it in on 10/30/17,but the receipt was not confirmed by pharmaccy. I called in the rx today.

## 2017-11-02 NOTE — Telephone Encounter (Signed)
Please review routing from the RX request folder.

## 2017-11-04 NOTE — Telephone Encounter (Signed)
Consider calling the patient to see if he needs the diltiazem Has not been seen since 02/2017 Suspect he does not need it

## 2017-11-05 DIAGNOSIS — J441 Chronic obstructive pulmonary disease with (acute) exacerbation: Secondary | ICD-10-CM | POA: Diagnosis not present

## 2017-11-05 DIAGNOSIS — Z720 Tobacco use: Secondary | ICD-10-CM | POA: Diagnosis not present

## 2017-11-05 DIAGNOSIS — I5032 Chronic diastolic (congestive) heart failure: Secondary | ICD-10-CM | POA: Diagnosis not present

## 2017-11-05 DIAGNOSIS — Z9981 Dependence on supplemental oxygen: Secondary | ICD-10-CM | POA: Diagnosis not present

## 2017-11-05 DIAGNOSIS — J189 Pneumonia, unspecified organism: Secondary | ICD-10-CM | POA: Diagnosis not present

## 2017-11-05 DIAGNOSIS — I110 Hypertensive heart disease with heart failure: Secondary | ICD-10-CM | POA: Diagnosis not present

## 2017-11-05 DIAGNOSIS — J9611 Chronic respiratory failure with hypoxia: Secondary | ICD-10-CM | POA: Diagnosis not present

## 2017-11-05 DIAGNOSIS — Z7951 Long term (current) use of inhaled steroids: Secondary | ICD-10-CM | POA: Diagnosis not present

## 2017-11-05 DIAGNOSIS — J45909 Unspecified asthma, uncomplicated: Secondary | ICD-10-CM | POA: Diagnosis not present

## 2017-11-05 DIAGNOSIS — J440 Chronic obstructive pulmonary disease with acute lower respiratory infection: Secondary | ICD-10-CM | POA: Diagnosis not present

## 2017-11-05 NOTE — Telephone Encounter (Signed)
S/w patient. States he does not feel he needs this prescription. Refill not sent.

## 2017-11-06 ENCOUNTER — Other Ambulatory Visit: Payer: Self-pay | Admitting: Internal Medicine

## 2017-11-07 ENCOUNTER — Other Ambulatory Visit: Payer: Self-pay

## 2017-11-07 MED ORDER — FUROSEMIDE 20 MG PO TABS: 20.0000 mg | ORAL_TABLET | Freq: Every day | ORAL | 5 refills | Status: DC

## 2017-11-08 ENCOUNTER — Encounter: Payer: Self-pay | Admitting: *Deleted

## 2017-11-08 ENCOUNTER — Ambulatory Visit: Payer: Self-pay | Admitting: Internal Medicine

## 2017-11-08 ENCOUNTER — Other Ambulatory Visit: Payer: Self-pay | Admitting: *Deleted

## 2017-11-08 ENCOUNTER — Telehealth: Payer: Self-pay | Admitting: Internal Medicine

## 2017-11-08 DIAGNOSIS — Z9981 Dependence on supplemental oxygen: Secondary | ICD-10-CM | POA: Diagnosis not present

## 2017-11-08 DIAGNOSIS — J9611 Chronic respiratory failure with hypoxia: Secondary | ICD-10-CM | POA: Diagnosis not present

## 2017-11-08 DIAGNOSIS — Z7951 Long term (current) use of inhaled steroids: Secondary | ICD-10-CM | POA: Diagnosis not present

## 2017-11-08 DIAGNOSIS — Z720 Tobacco use: Secondary | ICD-10-CM | POA: Diagnosis not present

## 2017-11-08 DIAGNOSIS — J440 Chronic obstructive pulmonary disease with acute lower respiratory infection: Secondary | ICD-10-CM | POA: Diagnosis not present

## 2017-11-08 DIAGNOSIS — I110 Hypertensive heart disease with heart failure: Secondary | ICD-10-CM | POA: Diagnosis not present

## 2017-11-08 DIAGNOSIS — J189 Pneumonia, unspecified organism: Secondary | ICD-10-CM | POA: Diagnosis not present

## 2017-11-08 DIAGNOSIS — J441 Chronic obstructive pulmonary disease with (acute) exacerbation: Secondary | ICD-10-CM | POA: Diagnosis not present

## 2017-11-08 DIAGNOSIS — J45909 Unspecified asthma, uncomplicated: Secondary | ICD-10-CM | POA: Diagnosis not present

## 2017-11-08 DIAGNOSIS — I5032 Chronic diastolic (congestive) heart failure: Secondary | ICD-10-CM | POA: Diagnosis not present

## 2017-11-08 NOTE — Patient Outreach (Signed)
[facility]) Care Management   11/08/2017  Dustin [name] 04/07/1956 [id number]  Dustin [name] is an 62 y.o. male  Subjective: Routine home visit with pt, HIPAA verified, pt reports home health OT saw him today and discharged him with all disciplines having discharged now.  Pt reports he is to see pulmonary MD 11/22/17, continues waiting on housing (on waiting list), continues living with son and daughter in law and grandchildren and sleeps on the couch.  Pt states he does not how to use proair respiclick.  Pt weighs daily but does not always record.   Objective:   Vitals:   11/08/17 1542  BP: 118/60  Pulse: (!) 108  Resp: 18  SpO2: 95%  Weight: 217 lb (98.4 kg)   ROS  Physical Exam  Constitutional: He is oriented to person, place, and time. He appears well-developed and well-nourished.  HENT:  Head: Normocephalic.  Neck: Normal range of motion. Neck supple.  Cardiovascular: Normal rate.  Respiratory: Effort normal.  Dyspnea with exertion bil breath sounds diminished all lobes Expiratory wheezing auscultated lower lobes bil- posterior  Musculoskeletal: Normal range of motion. He exhibits edema.  1+ edema lower extremities bil  Neurological: He is alert and oriented to person, place, and time.  Skin: Skin is warm and dry.  Psychiatric: He has a normal mood and affect. His behavior is normal. Judgment and thought content normal.    Encounter Medications:   Outpatient Encounter Medications as of 11/08/2017  Medication Sig  . Albuterol Sulfate (PROAIR RESPICLICK) 742 (90 Base) MCG/ACT AEPB Inhale 2 puffs into the lungs every 4 (four) hours as needed (for shortness of breath). Reported on 12/10/2015  . ALPRAZolam (XANAX) 1 MG tablet Take 1 tablet (1 mg total) by mouth 3 (three) times daily as needed for anxiety.  . Cholecalciferol (VITAMIN D) 2000 units CAPS Take 2,000 Units by mouth daily.  Marland Kitchen escitalopram (LEXAPRO) 20 MG tablet Take 20 mg by mouth at bedtime.    . furosemide (LASIX) 20 MG tablet Take 1 tablet (20 mg total) by mouth daily.  Marland Kitchen gabapentin (NEURONTIN) 600 MG tablet TAKE 1 TABLET BY MOUTH THREE TIMES DAILY  . guaiFENesin (MUCINEX) 600 MG 12 hr tablet Take 1 tablet (600 mg total) by mouth 2 (two) times daily.  Marland Kitchen ipratropium-albuterol (DUONEB) 0.5-2.5 (3) MG/3ML SOLN Take 3 mLs by nebulization 4 (four) times daily as needed (for shortness of breath).  . mometasone-formoterol (DULERA) 200-5 MCG/ACT AERO Inhale 1 puff into the lungs 2 (two) times daily.   . potassium chloride (K-DUR,KLOR-CON) 10 MEQ tablet Take 10 mEq by mouth daily.   . Probiotic Product (PROBIOTIC-10) CAPS Take 1 capsule by mouth 2 (two) times daily.   Marland Kitchen rOPINIRole (REQUIP) 0.25 MG tablet TAKE 1 TABLET BY MOUTH TWICE DAILY FOR CRAMPS  . Spacer/Aero-Holding Chambers (OPTICHAMBER ADVANTAGE-LG MASK) MISC Use as directed with inhaler diag  j44.1  . theophylline (UNIPHYL) 400 MG 24 hr tablet Take 400 mg by mouth daily.   . Tiotropium Bromide Monohydrate (SPIRIVA RESPIMAT) 2.5 MCG/ACT AERS Inhale 2 puffs into the lungs daily.   Marland Kitchen amoxicillin (AMOXIL) 500 MG capsule Take 1 capsule (500 mg total) by mouth every 8 (eight) hours. (Patient not taking: Reported on 11/08/2017)  . guaiFENesin-dextromethorphan (ROBITUSSIN DM) 100-10 MG/5ML syrup Take 5 mLs by mouth every 6 (six) hours as needed for cough. (Patient not taking: Reported on 11/08/2017)   No facility-administered encounter medications on file as of 11/08/2017.     Functional Status:  In your present state of health, do you have any difficulty performing the following activities: 10/05/2017 09/19/2017  Hearing? N N  Vision? N N  Difficulty concentrating or making decisions? N N  Walking or climbing stairs? Y Y  Dressing or bathing? Y Y  Doing errands, shopping? N N  Some recent data might be hidden    Fall/Depression Screening:    Fall Risk  10/11/2017 10/08/2017 10/05/2017  Falls in the past year? No - Yes  Number falls in  past yr: - 1 -  Injury with Fall? - No -  Risk for fall due to : - - Impaired mobility  Risk for fall due to: Comment - - -  Follow up - Falls prevention discussed -   PHQ 2/9 Scores 10/11/2017 10/05/2017 09/26/2017 04/12/2016 03/29/2016 03/02/2016 02/02/2016  PHQ - 2 Score _0 PHQ- 9 Score _1 Assessment:  HR 108 and pt states HR is elevated a lot of the time and MD aware, pt demonstrates no coughing during home visit but states he does have cough that varies throughout the day.  Pt verbalizes he is taking all medications as prescribed, RN CM instructed pt how to use proair respiclick and pt returned demonstration successfully. RN CM called primary care MD office Dr. Humphrey Rolls to give report of today's visit and received voicemail office is closed, RN CM faxed update to Dr. Humphrey Rolls reporting today's vital signs with HR 108, wheezing auscultated.  [facility] CM Care Plan Problem One     Most Recent Value  Care Plan Problem One  Risk for readmisson related to recent hospitalization for Tachycardia, COPD, Pneumonia,   Role Documenting the Problem One  Care Management Coordinator  Care Plan for Problem One  Active  [facility] Long Term Goal   Pt would not readmit to the hospital within the next 60 days  [facility] Long Term Goal Start Date  09/25/17  Interventions for Problem One Long Term Goal  RN CM reviewed with pt home health discharge and importance of continuing some type of exercise, reviewed plan on housing, pt still on waiting list  [facility] CM Short Term Goal #1   Pt would keep all MD appointments in the next 30 days   [facility] CM Short Term Goal #1 Start Date  11/08/17 [goal re-established   ongoing]  Interventions for Short Term Goal #1  RN CM reviewed upcoming appointments, pt to see pulmonary 11/22/17  pt states he is driving  [facility] CM Short Term Goal #2   Pt will verbalize COPD/ CHF action plan within 30 days  [facility] CM Short Term Goal #2 Start Date  11/08/17  Interventions for Short Term Goal #2  Pt has  copy of both COPD/ CHF action plan, RN CM reviewed action plans and which MD to call, reviewed importance of calling MD early for change in symptoms such as weight gain    [facility] CM Care Plan Problem Two     Most Recent Value  Care Plan Problem Two  Medication Management Concerns secondary to Financial Constraints  Role Documenting the Problem Two  Care Management [place] for Problem Two  Active  Interventions for Problem Two Long Term Goal   RN CM reviewed medications, instructed on use of proair respiclick, pt is working with [place] on barriers to affording medications, pt states he is taking all medication as prescribed.  [facility] Long Term Goal  Over the next 31 days, patient will verbalize understanding of plan to address barriers around medication procurement and management  [facility] Long Term Goal Start Date  09/26/17  [facility] Long Term Goal Met Date  11/08/17  [facility] CM Short Term Goal #1   Over the next 30 dayts, patient will work with [facility] Pharmacist re: Medication Management needs  [facility] CM Short Term Goal #1 Start Date  09/26/17  [facility] CM Short Term Goal #1 Met Date   11/08/17    [facility] CM Care Plan Problem Three     Most Recent Value  Care Plan Problem Three  Community Resource Needs related to housing and transportation  Role Documenting the Problem Three  Care Management [place] for Problem Three  Active  [facility] Long Term Goal   Over the next 31 days, patient will verbalize understanding of plan to address housing and transportation resources  [facility] Long Term Goal Start Date  09/26/17  Interventions for Problem Three Long Term Goal  discussed patient housing and transportation needs,  social work consult placed  [facility] CM Short Term Goal #1   Over the next 30 days, patient will make decision about long term housing options, verbalizing understanding of options in his community   [facility] CM Short Term Goal #1 Start Date  09/26/17  Interventions for Short Term Goal #1  discussed  known housing rescources in the desired area,  advised pt to work closely with social work team re: housing needs  [facility] CM Short Term Goal #2   Over the next 30 days, patient will verbalize understanding of transportation options including insurance benefit and community agency/resources  [facility] CM Short Term Goal #2 Start Date  09/26/17  Interventions for Short Term Goal #2  reviewed known community transportation resources,  social work consult made      Plan: follow up with phone call next week to assess how pt is feeling as there will be no one from home health checking in on pt. See pt for home visit next month  Jacqlyn Larsen [facility] [place] Coordinator [phone]

## 2017-11-08 NOTE — Telephone Encounter (Signed)
Home health called and said that Dustin [name] was not doing very well, he had congestion and o2 levels were down.  Wanted to change his appointment with DSK that he has scheduled for 11/22/17.  [facility] and she said to tell them to take him to ER.jw

## 2017-11-09 ENCOUNTER — Ambulatory Visit: Payer: Self-pay | Admitting: Pharmacist

## 2017-11-13 ENCOUNTER — Ambulatory Visit: Payer: Self-pay | Admitting: Internal Medicine

## 2017-11-14 ENCOUNTER — Other Ambulatory Visit: Payer: Self-pay | Admitting: *Deleted

## 2017-11-14 NOTE — Patient Outreach (Signed)
Telephone call to follow up on weight, spoke with pt, HIPAA verified, see care plan, weight 221 pounds, pt states breathing " the same", pt to see primary MD next week.  No new concerns or problems reported.  [facility] CM Care Plan Problem One     Most Recent Value  Care Plan Problem One  Risk for readmisson related to recent hospitalization for Tachycardia, COPD, Pneumonia,   Role Documenting the Problem One  Care Management Coordinator  Care Plan for Problem One  Active  [facility] Long Term Goal   Pt would not readmit to the hospital within the next 60 days  [facility] Long Term Goal Start Date  09/25/17  Interventions for Problem One Long Term Goal  RN CM reviewed importance of staying mobile, deep breathing and coughing, energy conservation  [facility] CM Short Term Goal #1   Pt would keep all MD appointments in the next 30 days   [facility] CM Short Term Goal #1 Start Date  11/08/17 Barrie Folk re-established   ongoing]  Interventions for Short Term Goal #1  RN CM reviewed upcoming primary MD appointment 11/22/17  [facility] CM Short Term Goal #2   Pt will verbalize COPD/ CHF action plan within 30 days  [facility] CM Short Term Goal #2 Start Date  11/08/17  Interventions for Short Term Goal #2  RN CM reinforced CHF/ COPD action plan,  pt weighs 221 pounds, (217 pounds on 11/08/17), 4 pound increase in 6 days, pt states he gained up to 219 pounds and stayed there for 3 days, then 221 pounds today, pt verbalizes with action plan 3 pounds overnight or 5 pounds in one week to call MD, pt states he is able to call MD.    [facility] CM Care Plan Problem Two     Most Recent Value  Care Plan Problem Two  Medication Management Concerns secondary to Financial Constraints  Role Documenting the Problem Two  Care Management Coordinator  Care Plan for Problem Two  Active  [facility] Long Term Goal  Over the next 31 days, patient will verbalize understanding of plan to address barriers around medication procurement and management  [facility] Long Term Goal Start Date  09/26/17   [facility] Long Term Goal Met Date  11/08/17  [facility] CM Short Term Goal #1   Over the next 30 dayts, patient will work with [facility] Pharmacist re: Medication Management needs  [facility] CM Short Term Goal #1 Start Date  09/26/17  [facility] CM Short Term Goal #1 Met Date   11/08/17    [facility] CM Care Plan Problem Three     Most Recent Value  Care Plan Problem Three  Community Resource Needs related to housing and transportation  Role Documenting the Problem Three  Care Management [place] for Problem Three  Active  [facility]N Long Term Goal   Over the next 31 days, patient will verbalize understanding of plan to address housing and transportation resources  [place] Term Goal Start Date  09/26/17  Interventions for Problem Three Long Term Goal  discussed patient housing and transportation needs,  social work consult placed  [facility] CM Short Term Goal #1   Over the next 30 days, patient will make decision about long term housing options, verbalizing understanding of options in his community   [facility] CM Short Term Goal #1 Start Date  09/26/17  Interventions for Short Term Goal #1  discussed known housing rescources in the desired area,  advised pt to work closely with social work  team re: housing needs  [facility] CM Short Term Goal #2   Over the next 30 days, patient will verbalize understanding of transportation options including insurance benefit and community agency/resources  [facility] CM Short Term Goal #2 Start Date  09/26/17  Interventions for Short Term Goal #2  reviewed known community transportation resources,  social work consult made     PLAN Call pt beginning of next week to follow up on weight  Jacqlyn Larsen [facility], [place] Coordinator [phone]

## 2017-11-15 NOTE — Telephone Encounter (Signed)
done

## 2017-11-16 ENCOUNTER — Ambulatory Visit: Payer: Self-pay | Admitting: *Deleted

## 2017-11-16 DIAGNOSIS — J449 Chronic obstructive pulmonary disease, unspecified: Secondary | ICD-10-CM | POA: Diagnosis not present

## 2017-11-19 ENCOUNTER — Other Ambulatory Visit: Payer: Self-pay | Admitting: *Deleted

## 2017-11-19 NOTE — Patient Outreach (Addendum)
Telephone call to pt to follow up on weight, spoke with pt, HIPAA verified, pt reports "weight has come down, I'm at 209 pounds"  RN CM questioned weight loss from 221 pounds on 2/27, pt states he has lost fluid and thinks he was looking at scale correctly.  Breathing " is about the same"  Pt to see primary MD 11/22/17 and will discuss issues with edema, weight gain or loss at that time, no new concerns voiced.  PLAN See pt for home visit this month  Irving ShowsJulie Farmer [facility], BSN [facility] Inst[facility] Care Coordinator [phone]

## 2017-11-19 NOTE — Patient Outreach (Signed)
[name]) Care Management  11/19/2017  Dustin CoriaDennis R [name] 15-Feb-1956 [id number]   CSW attempted to reach patient to follow-up on housing resources, but patient did not answer - CSW left HIPPA compliant voicemail & will follow-up tomorrow.    Lincoln MaxinKelly Lariya Kinzie, [name] [name]  Clinical Social Worker cell #: [phone]

## 2017-11-20 ENCOUNTER — Encounter: Payer: Self-pay | Admitting: Internal Medicine

## 2017-11-20 ENCOUNTER — Ambulatory Visit: Payer: Self-pay | Admitting: *Deleted

## 2017-11-22 ENCOUNTER — Inpatient Hospital Stay (HOSPITAL_COMMUNITY)
Admission: EM | Admit: 2017-11-22 | Discharge: 2017-11-29 | DRG: 208 | Disposition: A | Discharge status: Skilled Nursing Facility | Payer: Medicare Other | Attending: Internal Medicine | Admitting: Internal Medicine

## 2017-11-22 ENCOUNTER — Inpatient Hospital Stay: Payer: Self-pay

## 2017-11-22 ENCOUNTER — Emergency Department (HOSPITAL_COMMUNITY): Payer: Medicare Other

## 2017-11-22 ENCOUNTER — Inpatient Hospital Stay (HOSPITAL_COMMUNITY): Payer: Medicare Other

## 2017-11-22 ENCOUNTER — Ambulatory Visit: Payer: Self-pay | Admitting: Internal Medicine

## 2017-11-22 ENCOUNTER — Encounter (HOSPITAL_COMMUNITY): Payer: Self-pay

## 2017-11-22 DIAGNOSIS — G629 Polyneuropathy, unspecified: Secondary | ICD-10-CM | POA: Diagnosis present

## 2017-11-22 DIAGNOSIS — Z825 Family history of asthma and other chronic lower respiratory diseases: Secondary | ICD-10-CM

## 2017-11-22 DIAGNOSIS — Z87891 Personal history of nicotine dependence: Secondary | ICD-10-CM | POA: Diagnosis not present

## 2017-11-22 DIAGNOSIS — J440 Chronic obstructive pulmonary disease with acute lower respiratory infection: Secondary | ICD-10-CM | POA: Diagnosis present

## 2017-11-22 DIAGNOSIS — I5033 Acute on chronic diastolic (congestive) heart failure: Secondary | ICD-10-CM | POA: Diagnosis present

## 2017-11-22 DIAGNOSIS — J441 Chronic obstructive pulmonary disease with (acute) exacerbation: Secondary | ICD-10-CM | POA: Diagnosis not present

## 2017-11-22 DIAGNOSIS — J449 Chronic obstructive pulmonary disease, unspecified: Secondary | ICD-10-CM | POA: Diagnosis not present

## 2017-11-22 DIAGNOSIS — K219 Gastro-esophageal reflux disease without esophagitis: Secondary | ICD-10-CM | POA: Diagnosis not present

## 2017-11-22 DIAGNOSIS — I509 Heart failure, unspecified: Secondary | ICD-10-CM | POA: Diagnosis not present

## 2017-11-22 DIAGNOSIS — E872 Acidosis: Secondary | ICD-10-CM | POA: Diagnosis not present

## 2017-11-22 DIAGNOSIS — J9602 Acute respiratory failure with hypercapnia: Secondary | ICD-10-CM

## 2017-11-22 DIAGNOSIS — Z823 Family history of stroke: Secondary | ICD-10-CM

## 2017-11-22 DIAGNOSIS — G2581 Restless legs syndrome: Secondary | ICD-10-CM | POA: Diagnosis present

## 2017-11-22 DIAGNOSIS — Z4682 Encounter for fitting and adjustment of non-vascular catheter: Secondary | ICD-10-CM | POA: Diagnosis not present

## 2017-11-22 DIAGNOSIS — Z8249 Family history of ischemic heart disease and other diseases of the circulatory system: Secondary | ICD-10-CM

## 2017-11-22 DIAGNOSIS — J9621 Acute and chronic respiratory failure with hypoxia: Secondary | ICD-10-CM | POA: Diagnosis not present

## 2017-11-22 DIAGNOSIS — J189 Pneumonia, unspecified organism: Secondary | ICD-10-CM | POA: Diagnosis present

## 2017-11-22 DIAGNOSIS — Z8261 Family history of arthritis: Secondary | ICD-10-CM | POA: Diagnosis not present

## 2017-11-22 DIAGNOSIS — R0682 Tachypnea, not elsewhere classified: Secondary | ICD-10-CM | POA: Diagnosis not present

## 2017-11-22 DIAGNOSIS — R0689 Other abnormalities of breathing: Secondary | ICD-10-CM

## 2017-11-22 DIAGNOSIS — F419 Anxiety disorder, unspecified: Secondary | ICD-10-CM | POA: Diagnosis present

## 2017-11-22 DIAGNOSIS — N179 Acute kidney failure, unspecified: Secondary | ICD-10-CM | POA: Diagnosis not present

## 2017-11-22 DIAGNOSIS — Z8349 Family history of other endocrine, nutritional and metabolic diseases: Secondary | ICD-10-CM

## 2017-11-22 DIAGNOSIS — Y95 Nosocomial condition: Secondary | ICD-10-CM | POA: Diagnosis not present

## 2017-11-22 DIAGNOSIS — Z978 Presence of other specified devices: Secondary | ICD-10-CM

## 2017-11-22 DIAGNOSIS — J181 Lobar pneumonia, unspecified organism: Secondary | ICD-10-CM

## 2017-11-22 DIAGNOSIS — G4733 Obstructive sleep apnea (adult) (pediatric): Secondary | ICD-10-CM | POA: Diagnosis not present

## 2017-11-22 DIAGNOSIS — J9622 Acute and chronic respiratory failure with hypercapnia: Secondary | ICD-10-CM | POA: Diagnosis not present

## 2017-11-22 DIAGNOSIS — I110 Hypertensive heart disease with heart failure: Secondary | ICD-10-CM | POA: Diagnosis not present

## 2017-11-22 DIAGNOSIS — R739 Hyperglycemia, unspecified: Secondary | ICD-10-CM | POA: Diagnosis present

## 2017-11-22 DIAGNOSIS — I361 Nonrheumatic tricuspid (valve) insufficiency: Secondary | ICD-10-CM | POA: Diagnosis not present

## 2017-11-22 DIAGNOSIS — R0603 Acute respiratory distress: Secondary | ICD-10-CM | POA: Diagnosis not present

## 2017-11-22 DIAGNOSIS — F329 Major depressive disorder, single episode, unspecified: Secondary | ICD-10-CM | POA: Diagnosis present

## 2017-11-22 DIAGNOSIS — R0902 Hypoxemia: Secondary | ICD-10-CM | POA: Diagnosis not present

## 2017-11-22 DIAGNOSIS — J9690 Respiratory failure, unspecified, unspecified whether with hypoxia or hypercapnia: Secondary | ICD-10-CM

## 2017-11-22 DIAGNOSIS — J1289 Other viral pneumonia: Secondary | ICD-10-CM | POA: Diagnosis not present

## 2017-11-22 DIAGNOSIS — R0602 Shortness of breath: Secondary | ICD-10-CM | POA: Diagnosis not present

## 2017-11-22 DIAGNOSIS — M6281 Muscle weakness (generalized): Secondary | ICD-10-CM | POA: Diagnosis not present

## 2017-11-22 DIAGNOSIS — T380X5A Adverse effect of glucocorticoids and synthetic analogues, initial encounter: Secondary | ICD-10-CM | POA: Diagnosis not present

## 2017-11-22 DIAGNOSIS — R05 Cough: Secondary | ICD-10-CM | POA: Diagnosis not present

## 2017-11-22 DIAGNOSIS — J810 Acute pulmonary edema: Secondary | ICD-10-CM | POA: Diagnosis not present

## 2017-11-22 DIAGNOSIS — J9601 Acute respiratory failure with hypoxia: Secondary | ICD-10-CM

## 2017-11-22 DIAGNOSIS — J9600 Acute respiratory failure, unspecified whether with hypoxia or hypercapnia: Secondary | ICD-10-CM | POA: Diagnosis not present

## 2017-11-22 DIAGNOSIS — J90 Pleural effusion, not elsewhere classified: Secondary | ICD-10-CM | POA: Diagnosis not present

## 2017-11-22 LAB — BRAIN NATRIURETIC PEPTIDE: B Natriuretic Peptide: 407 pg/mL — ABNORMAL HIGH (ref 0.0–100.0)

## 2017-11-22 LAB — GLUCOSE, CAPILLARY
Glucose-Capillary: 119 mg/dL — ABNORMAL HIGH (ref 65–99)
Glucose-Capillary: 149 mg/dL — ABNORMAL HIGH (ref 65–99)

## 2017-11-22 LAB — POCT I-STAT 3, ART BLOOD GAS (G3+)
ACID-BASE EXCESS: 6 mmol/L — AB (ref 0.0–2.0)
Acid-Base Excess: 5 mmol/L — ABNORMAL HIGH (ref 0.0–2.0)
BICARBONATE: 32.9 mmol/L — AB (ref 20.0–28.0)
Bicarbonate: 35.9 mmol/L — ABNORMAL HIGH (ref 20.0–28.0)
O2 SAT: 94 %
O2 Saturation: 98 %
PH ART: 7.207 — AB (ref 7.350–7.450)
PH ART: 7.37 (ref 7.350–7.450)
Patient temperature: 100.6
TCO2: 39 mmol/L — ABNORMAL HIGH (ref 22–32)
TCO2: 35 mmol/L — AB (ref 22–32)
pCO2 arterial: >91 mmHg (ref 32.0–48.0)
pCO2 arterial: 56.9 mmHg — ABNORMAL HIGH (ref 32.0–48.0)
pO2, Arterial: 135 mmHg — ABNORMAL HIGH (ref 83.0–108.0)
pO2, Arterial: 75 mmHg — ABNORMAL LOW (ref 83.0–108.0)

## 2017-11-22 LAB — PROCALCITONIN: Procalcitonin: 0.94 ng/mL

## 2017-11-22 LAB — BLOOD GAS, ARTERIAL
ACID-BASE EXCESS: 6.3 mmol/L — AB (ref 0.0–2.0)
BICARBONATE: 28.5 mmol/L — AB (ref 20.0–28.0)
DRAWN BY: 23534
DRAWN BY: 23534
Expiratory PAP: 5
FIO2: 0.5
FIO2: 1
Inspiratory PAP: 14
LHR: 18 {breaths}/min
MODE: POSITIVE
O2 CONTENT: 100 L/min
O2 Saturation: 75.9 %
O2 Saturation: 98.9 %
PEEP/CPAP: 5 cmH2O
PO2 ART: 55.8 mmHg — AB (ref 83.0–108.0)
Patient temperature: 37
Patient temperature: 37
VT: 530 mL
pCO2 arterial: 71.3 mmHg (ref 32.0–48.0)
pH, Arterial: 7.045 — CL (ref 7.350–7.450)
pH, Arterial: 7.283 — ABNORMAL LOW (ref 7.350–7.450)
pO2, Arterial: 181 mmHg — ABNORMAL HIGH (ref 83.0–108.0)

## 2017-11-22 LAB — CBC WITH DIFFERENTIAL/PLATELET
Basophils Absolute: 0 10*3/uL (ref 0.0–0.1)
Basophils Relative: 0 %
EOS PCT: 0 %
Eosinophils Absolute: 0 10*3/uL (ref 0.0–0.7)
HEMATOCRIT: 40.6 % (ref 39.0–52.0)
HEMOGLOBIN: 12.5 g/dL — AB (ref 13.0–17.0)
LYMPHS ABS: 1.1 10*3/uL (ref 0.7–4.0)
LYMPHS PCT: 12 %
MCH: 32.4 pg (ref 26.0–34.0)
MCHC: 30.8 g/dL (ref 30.0–36.0)
MCV: 105.2 fL — AB (ref 78.0–100.0)
Monocytes Absolute: 1 10*3/uL (ref 0.1–1.0)
Monocytes Relative: 11 %
NEUTROS ABS: 7.6 10*3/uL (ref 1.7–7.7)
NEUTROS PCT: 77 %
Platelets: 188 10*3/uL (ref 150–400)
RBC: 3.86 MIL/uL — AB (ref 4.22–5.81)
RDW: 12.7 % (ref 11.5–15.5)
WBC: 9.8 10*3/uL (ref 4.0–10.5)

## 2017-11-22 LAB — TROPONIN I
TROPONIN I: 0.08 ng/mL — AB (ref <0.03)
Troponin I: 0.23 ng/mL

## 2017-11-22 LAB — COMPREHENSIVE METABOLIC PANEL
ALT: 29 U/L (ref 17–63)
AST: 23 U/L (ref 15–41)
Albumin: 3.4 g/dL — ABNORMAL LOW (ref 3.5–5.0)
Alkaline Phosphatase: 103 U/L (ref 38–126)
Anion gap: 8 (ref 5–15)
BILIRUBIN TOTAL: 0.9 mg/dL (ref 0.3–1.2)
BUN: 15 mg/dL (ref 6–20)
CALCIUM: 9.1 mg/dL (ref 8.9–10.3)
CO2: 36 mmol/L — ABNORMAL HIGH (ref 22–32)
CREATININE: 1.36 mg/dL — AB (ref 0.61–1.24)
Chloride: 98 mmol/L — ABNORMAL LOW (ref 101–111)
GFR, EST NON AFRICAN AMERICAN: 54 mL/min — AB (ref >60)
Glucose, Bld: 124 mg/dL — ABNORMAL HIGH (ref 65–99)
Potassium: 4.4 mmol/L (ref 3.5–5.1)
Sodium: 142 mmol/L (ref 135–145)
Total Protein: 7.7 g/dL (ref 6.5–8.1)

## 2017-11-22 LAB — RAPID URINE DRUG SCREEN, HOSP PERFORMED
Amphetamines: NOT DETECTED
Barbiturates: NOT DETECTED
Benzodiazepines: POSITIVE — AB
Cocaine: NOT DETECTED
Opiates: NOT DETECTED
Tetrahydrocannabinol: NOT DETECTED

## 2017-11-22 LAB — TRIGLYCERIDES: Triglycerides: 70 mg/dL (ref <150)

## 2017-11-22 MED ORDER — VANCOMYCIN HCL IN DEXTROSE 1-5 GM/200ML-% IV SOLN
1000.0000 mg | Freq: Two times a day (BID) | INTRAVENOUS | Status: DC
  Administered 2017-11-23: 1000 mg via INTRAVENOUS
  Filled 2017-11-22: qty 200

## 2017-11-22 MED ORDER — PROPOFOL 1000 MG/100ML IV EMUL
5.0000 ug/kg/min | Freq: Once | INTRAVENOUS | Status: AC
  Administered 2017-11-22: 5 ug/kg/min via INTRAVENOUS

## 2017-11-22 MED ORDER — SODIUM CHLORIDE 0.9 % IV SOLN
INTRAVENOUS | Status: DC
  Administered 2017-11-26 – 2017-11-27 (×2): via INTRAVENOUS

## 2017-11-22 MED ORDER — CHLORHEXIDINE GLUCONATE 0.12% ORAL RINSE (MEDLINE KIT)
15.0000 mL | Freq: Two times a day (BID) | OROMUCOSAL | Status: DC
  Administered 2017-11-22 – 2017-11-25 (×6): 15 mL via OROMUCOSAL

## 2017-11-22 MED ORDER — ALBUTEROL SULFATE (2.5 MG/3ML) 0.083% IN NEBU
2.5000 mg | INHALATION_SOLUTION | RESPIRATORY_TRACT | Status: DC | PRN
  Administered 2017-11-23: 2.5 mg via RESPIRATORY_TRACT
  Filled 2017-11-22: qty 3

## 2017-11-22 MED ORDER — IPRATROPIUM-ALBUTEROL 0.5-2.5 (3) MG/3ML IN SOLN
3.0000 mL | Freq: Once | RESPIRATORY_TRACT | Status: AC
  Administered 2017-11-22: 3 mL via RESPIRATORY_TRACT
  Filled 2017-11-22: qty 3

## 2017-11-22 MED ORDER — ETOMIDATE 2 MG/ML IV SOLN
15.0000 mg | Freq: Once | INTRAVENOUS | Status: AC
Start: 2017-11-22 — End: 2017-11-22
  Administered 2017-11-22: 15 mg via INTRAVENOUS

## 2017-11-22 MED ORDER — FUROSEMIDE 10 MG/ML IJ SOLN
20.0000 mg | Freq: Once | INTRAMUSCULAR | Status: AC
  Administered 2017-11-22: 20 mg via INTRAVENOUS

## 2017-11-22 MED ORDER — FUROSEMIDE 10 MG/ML IJ SOLN
40.0000 mg | Freq: Once | INTRAMUSCULAR | Status: AC
  Administered 2017-11-22: 40 mg via INTRAVENOUS
  Filled 2017-11-22: qty 4

## 2017-11-22 MED ORDER — ONDANSETRON HCL 4 MG/2ML IJ SOLN
4.0000 mg | Freq: Once | INTRAMUSCULAR | Status: AC
  Administered 2017-11-22: 4 mg via INTRAVENOUS
  Filled 2017-11-22: qty 2

## 2017-11-22 MED ORDER — PROPOFOL 1000 MG/100ML IV EMUL
INTRAVENOUS | Status: AC
  Filled 2017-11-22: qty 100

## 2017-11-22 MED ORDER — FENTANYL CITRATE (PF) 100 MCG/2ML IJ SOLN: 50.0000 ug | INTRAMUSCULAR | Status: DC | PRN

## 2017-11-22 MED ORDER — PROPOFOL 1000 MG/100ML IV EMUL
0.0000 ug/kg/min | INTRAVENOUS | Status: DC
Start: 2017-11-22 — End: 2017-11-25
  Administered 2017-11-23: 10 ug/kg/min via INTRAVENOUS
  Administered 2017-11-23: 15 ug/kg/min via INTRAVENOUS
  Administered 2017-11-24: 40 ug/kg/min via INTRAVENOUS
  Administered 2017-11-24 – 2017-11-25 (×3): 30 ug/kg/min via INTRAVENOUS
  Administered 2017-11-25: 40 ug/kg/min via INTRAVENOUS
  Filled 2017-11-22: qty 100
  Filled 2017-11-22: qty 200
  Filled 2017-11-22 (×6): qty 100

## 2017-11-22 MED ORDER — SODIUM CHLORIDE 0.9 % IV SOLN
1.0000 g | Freq: Once | INTRAVENOUS | Status: AC
  Administered 2017-11-22: 1 g via INTRAVENOUS
  Filled 2017-11-22: qty 10

## 2017-11-22 MED ORDER — METHYLPREDNISOLONE SODIUM SUCC 125 MG IJ SOLR
60.0000 mg | Freq: Three times a day (TID) | INTRAMUSCULAR | Status: DC
  Administered 2017-11-22 – 2017-11-24 (×6): 60 mg via INTRAVENOUS
  Filled 2017-11-22 (×5): qty 2

## 2017-11-22 MED ORDER — HEPARIN SODIUM (PORCINE) 5000 UNIT/ML IJ SOLN
5000.0000 [IU] | Freq: Three times a day (TID) | INTRAMUSCULAR | Status: DC
  Administered 2017-11-22 – 2017-11-29 (×20): 5000 [IU] via SUBCUTANEOUS
  Filled 2017-11-22 (×20): qty 1

## 2017-11-22 MED ORDER — SODIUM CHLORIDE 0.9 % IV SOLN: 250.0000 mL | INTRAVENOUS | Status: DC | PRN

## 2017-11-22 MED ORDER — PROPOFOL 1000 MG/100ML IV EMUL: 55.0000 ug/kg/min | Freq: Once | INTRAVENOUS | Status: DC

## 2017-11-22 MED ORDER — INSULIN ASPART 100 UNIT/ML ~~LOC~~ SOLN
2.0000 [IU] | SUBCUTANEOUS | Status: DC
  Administered 2017-11-23: 4 [IU] via SUBCUTANEOUS
  Administered 2017-11-23: 2 [IU] via SUBCUTANEOUS
  Administered 2017-11-23: 4 [IU] via SUBCUTANEOUS
  Administered 2017-11-23 – 2017-11-24 (×6): 2 [IU] via SUBCUTANEOUS
  Administered 2017-11-24: 4 [IU] via SUBCUTANEOUS

## 2017-11-22 MED ORDER — BUDESONIDE 0.5 MG/2ML IN SUSP
0.5000 mg | Freq: Two times a day (BID) | RESPIRATORY_TRACT | Status: DC
  Administered 2017-11-22 – 2017-11-29 (×14): 0.5 mg via RESPIRATORY_TRACT
  Filled 2017-11-22 (×14): qty 2

## 2017-11-22 MED ORDER — IPRATROPIUM BROMIDE 0.02 % IN SOLN
0.5000 mg | Freq: Four times a day (QID) | RESPIRATORY_TRACT | Status: DC
  Administered 2017-11-22 – 2017-11-23 (×4): 0.5 mg via RESPIRATORY_TRACT
  Filled 2017-11-22 (×4): qty 2.5

## 2017-11-22 MED ORDER — ARFORMOTEROL TARTRATE 15 MCG/2ML IN NEBU
15.0000 ug | INHALATION_SOLUTION | Freq: Two times a day (BID) | RESPIRATORY_TRACT | Status: DC
  Administered 2017-11-22 – 2017-11-29 (×14): 15 ug via RESPIRATORY_TRACT
  Filled 2017-11-22 (×14): qty 2

## 2017-11-22 MED ORDER — FUROSEMIDE 10 MG/ML IJ SOLN
40.0000 mg | Freq: Once | INTRAMUSCULAR | Status: DC
  Filled 2017-11-22: qty 4

## 2017-11-22 MED ORDER — METHYLPREDNISOLONE SODIUM SUCC 125 MG IJ SOLR
125.0000 mg | Freq: Once | INTRAMUSCULAR | Status: DC
  Filled 2017-11-22: qty 2

## 2017-11-22 MED ORDER — ORAL CARE MOUTH RINSE
15.0000 mL | OROMUCOSAL | Status: DC
  Administered 2017-11-22 – 2017-11-25 (×28): 15 mL via OROMUCOSAL

## 2017-11-22 MED ORDER — VANCOMYCIN HCL 10 G IV SOLR
1750.0000 mg | Freq: Once | INTRAVENOUS | Status: AC
  Administered 2017-11-22: 1750 mg via INTRAVENOUS
  Filled 2017-11-22: qty 1750

## 2017-11-22 MED ORDER — SODIUM CHLORIDE 0.9 % IV SOLN
500.0000 mg | INTRAVENOUS | Status: DC
  Administered 2017-11-22: 500 mg via INTRAVENOUS
  Filled 2017-11-22 (×3): qty 500

## 2017-11-22 MED ORDER — PANTOPRAZOLE SODIUM 40 MG IV SOLR
40.0000 mg | Freq: Every day | INTRAVENOUS | Status: DC
  Administered 2017-11-22 – 2017-11-23 (×2): 40 mg via INTRAVENOUS
  Filled 2017-11-22 (×2): qty 40

## 2017-11-22 MED ORDER — METHYLPREDNISOLONE SODIUM SUCC 125 MG IJ SOLR: 60.0000 mg | Freq: Three times a day (TID) | INTRAMUSCULAR | Status: DC

## 2017-11-22 MED ORDER — SODIUM CHLORIDE 0.9 % IV SOLN
1.0000 g | Freq: Three times a day (TID) | INTRAVENOUS | Status: DC
  Administered 2017-11-22 – 2017-11-26 (×12): 1 g via INTRAVENOUS
  Filled 2017-11-22 (×13): qty 1

## 2017-11-22 MED ORDER — SUCCINYLCHOLINE CHLORIDE 20 MG/ML IJ SOLN
150.0000 mg | Freq: Once | INTRAMUSCULAR | Status: AC
  Administered 2017-11-22: 150 mg via INTRAVENOUS

## 2017-11-22 MED ORDER — POTASSIUM CHLORIDE 20 MEQ/15ML (10%) PO SOLN
40.0000 meq | Freq: Once | ORAL | Status: AC
  Administered 2017-11-22: 40 meq
  Filled 2017-11-22: qty 30

## 2017-11-22 MED ORDER — INSULIN ASPART 100 UNIT/ML ~~LOC~~ SOLN: 0.0000 [IU] | Freq: Three times a day (TID) | SUBCUTANEOUS | Status: DC

## 2017-11-22 MED ORDER — SODIUM CHLORIDE 0.9 % IV BOLUS (SEPSIS)
1000.0000 mL | Freq: Once | INTRAVENOUS | Status: AC
  Administered 2017-11-22: 1000 mL via INTRAVENOUS

## 2017-11-22 NOTE — ED Notes (Signed)
Pt reaching up for ET Tube.  Pt has eyes open.  Mittens applied and diprivan increased.

## 2017-11-22 NOTE — ED Notes (Signed)
Unable to get OG inserted.  Applied condomn catheter.
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
## 2017-11-22 NOTE — ED Notes (Signed)
pts bp 91/63, edp at bedside.  Instructed to decrease diprivan to 2.445mcgkg/min

## 2017-11-22 NOTE — ED Notes (Signed)
CRITICAL VALUE ALERT  Critical Value:  Troponin 0.08  Date & Time Notied:  1151, 11/22/2017  Provider Notified: Dr. Adriana Simasook Orders Received/Actions taken: see chart

## 2017-11-22 NOTE — ED Notes (Signed)
Care Link called 

## 2017-11-22 NOTE — ED Triage Notes (Signed)
Per ems, pt has had pneumonia recently and has been in and out of the hospital for the past 5 months.  Reports was called out for sob.  O2 sat 79% on nrb.  EMS says pt had rhonchi in all fields, productive cough with green sputum.  Pt uses bipap at night but has had to be on cpap frequently.  Ems admnistered 5mg  albuterol 125mg  solumedro; iv.  Pt on cpap.  Pt has 22g in left wrist.

## 2017-11-22 NOTE — ED Notes (Signed)
2 attempts to intubate.

## 2017-11-22 NOTE — Progress Notes (Signed)
Pharmacy Antibiotic Note  Dustin CoriaDennis R [name] is a 62 y.o. male admitted on 11/22/2017 with profound respiratory distress and concern for pneumonia.  CXR shows a RLL pneumonia. Pharmacy has been consulted for Vancomycin and Cefepime dosing. WBC is within normal limits. Patient is afebrile. SCr is 1.36 (up from baseline 0.9 to 1.1). Azithromycin and Ceftriaxone doses were given at 11:20 AM today. Patient was discharged from [facility] on 09/24/17 after a 7 day admission which is within the last 90 days.  Plan: Start Cefepime 1g IV every 8 hours.  Start Vancomycin 1750 mg IV x1 then 1000 mg IV every 12 hours.  Monitor renal function, clinical status, and culture results.  Narrow therapy as able.  Vancomycin trough at Css if continued.   Height: 5\' 7"  (170.2 cm) Weight: 217 lb (98.4 kg) IBW/kg (Calculated) : 66.1  Temp (24hrs), Avg:99.2 F (37.3 C), Min:98.2 F (36.8 C), Max:100.1 F (37.8 C)  Recent Labs  Lab 11/22/17 1048  WBC 9.8  CREATININE 1.36*    Estimated Creatinine Clearance: 62.1 mL/min (A) (by C-G formula based on SCr of 1.36 mg/dL (H)).    Allergies  Allergen Reactions  . Asa [Aspirin] Other (See Comments)    Reaction: swelling of the right side.  . Bee Venom Swelling  . Codeine Other (See Comments)    Reaction: Difficulty breathing  . Ibuprofen Other (See Comments)    Reaction: Swelling of the right side.  . Iodinated Diagnostic Agents Other (See Comments)    Reaction:  Unknown  Other reaction(s): Unknown    Antimicrobials this admission: Azithromycin 3/7 x1 Ceftriaxone 3/7 x1 Cefepime 3/7 >> Vancomycin 3/7 >>  Dose adjustments this admission:   Microbiology results: 3/7 Resp cx >> 3/7 RVP >> 3/7 Blood >> 3/7 Urine >>  Thank you for allowing pharmacy to be a part of this patient's care.  Link SnufferJessica Kayti Poss, PharmD, BCPS, BCCCP Clinical Pharmacist Clinical phone 11/22/2017 until 11PM [phone]- #[phone] After hours, please call #[phone] 11/22/2017 5:58 PM

## 2017-11-22 NOTE — Progress Notes (Signed)
Per Chrissie NoaWilliam, RN PICC to be placed tomorrow 11-23-17.  Gasper LloydKerry Danaka Llera, RN VAST

## 2017-11-22 NOTE — ED Notes (Signed)
Bagging pt with ambu  bag

## 2017-11-22 NOTE — Significant Event (Addendum)
Post int[facility] cxr  [facility] 7 cm above carina ( above clavicles); RT told to advance ETT to 25cm; Rpt cxr.  [facility];  also needs gastric tube verification.  Worsened acute  resp acidosis; increased rate to 20; f/u ABG. bp -soft;  To get lasix 20 mg ivp instead of 40mg . Minimize propofol; use fentanyl.  Caryl ComesSiva P Sivakumar, MD PCCM 8pm  11-22-2017   F/u cxr- tip of ETT below calvicle; good positioning; G tube in stomach.  11-22-17 8.30 pm

## 2017-11-22 NOTE — Progress Notes (Signed)
Patient able to follow commands and when asked if he wanted me to contact anyone he nodded his head yes.  I gave him name of emergency contact listed in the charge that is listed as his sister.  He nodded yes to contacting her.  Made contact with his sister and she provided history of multiple admissions to hospital and a recent celebration for him staying out of hospital for a whole month.    She says he lives with his son and his grand daughters at this time and that his son will not be visiting because he is a Naval architecttruck driver.  She reports to being the contact person for her bother in the past and the patients nodded his head in agreement with this. They have not completed any paper work with these instructions at this time.    Jacqulyn Canehristopher Scott Atisha Hamidi RN, BSN, CCRN

## 2017-11-22 NOTE — ED Notes (Signed)
Unable to get catheter to pass through the urinary meatus.  Attempted twice

## 2017-11-22 NOTE — H&P (Signed)
PULMONARY / CRITICAL CARE MEDICINE   Name: Dustin [name] MRN: [id number] DOB: Jan 24, 1956    ADMISSION DATE:  11/22/2017 CONSULTATION DATE:  3/7  REFERRING MD:  Jeani Hawking   CHIEF COMPLAINT: Respiratory failure  HISTORY OF PRESENT ILLNESS:   62 year old male with history of COPD with ongoing tobacco abuse, chronic diastolic CHF, hypertension, recent pneumonia treated as outpt presented 3/7 to [facility] ER in profound respiratory distress.  Per EMS patient had prodromal URI symptoms for several days prior to admission.  He had significant respiratory distress treated with BiPAP in route, intubated quickly on arrival to ER.  Chest x-ray suspicious for pneumonia with overlying pulmonary edema.  PCCM was consulted for transfer to [facility] for ongoing ICU care and ventilator management.    Most recent hospitalization 09/2017 for AECOPD.   PAST MEDICAL HISTORY :  He  has a past medical history of Allergy, Anxiety, Anxiety, Asthma, Chronic diastolic CHF (congestive heart failure) (HCC), Chronic respiratory failure (HCC), COPD (chronic obstructive pulmonary disease) (HCC), Depression, Depression, Depression, Emphysema of lung (HCC), GERD (gastroesophageal reflux disease), Hypertension, Personal history of tobacco use, presenting hazards to health (08/17/2015), and Tobacco abuse.  PAST SURGICAL HISTORY: He  has a past surgical history that includes Abdominal surgery; Nose surgery; hemorrh; Urethra surgery; Adenoidectomy; Carpal tunnel release (Right); Tennis elbow release/nirschel procedure (Right); Rotator cuff repair (Right); reflux surgery; Nasal sinus surgery; Urethra surgery; Shoulder surgery; Elbow surgery (Right); corpal tunnel (Right); and hemmorhoid (N/A).  Allergies  Allergen Reactions  . Asa [Aspirin] Other (See Comments)    Reaction: swelling of the right side.  . Bee Venom Swelling  . Codeine Other (See Comments)    Reaction: Difficulty breathing  . Ibuprofen Other (See Comments)     Reaction: Swelling of the right side.  . Iodinated Diagnostic Agents Other (See Comments)    Reaction:  Unknown  Other reaction(s): Unknown    No current facility-administered medications on file prior to encounter.    Current Outpatient Medications on File Prior to Encounter  Medication Sig  . Albuterol Sulfate (PROAIR RESPICLICK) 108 (90 Base) MCG/ACT AEPB Inhale 2 puffs into the lungs every 4 (four) hours as needed (for shortness of breath). Reported on 12/10/2015  . ALPRAZolam (XANAX) 1 MG tablet Take 1 tablet (1 mg total) by mouth 3 (three) times daily as needed for anxiety.  Marland Kitchen amoxicillin (AMOXIL) 500 MG capsule Take 1 capsule (500 mg total) by mouth every 8 (eight) hours. (Patient not taking: Reported on 11/08/2017)  . Cholecalciferol (VITAMIN D) 2000 units CAPS Take 2,000 Units by mouth daily.  Marland Kitchen escitalopram (LEXAPRO) 20 MG tablet Take 20 mg by mouth at bedtime.   . furosemide (LASIX) 20 MG tablet Take 1 tablet (20 mg total) by mouth daily.  Marland Kitchen gabapentin (NEURONTIN) 600 MG tablet TAKE 1 TABLET BY MOUTH THREE TIMES DAILY  . guaiFENesin (MUCINEX) 600 MG 12 hr tablet Take 1 tablet (600 mg total) by mouth 2 (two) times daily.  Marland Kitchen guaiFENesin-dextromethorphan (ROBITUSSIN DM) 100-10 MG/5ML syrup Take 5 mLs by mouth every 6 (six) hours as needed for cough. (Patient not taking: Reported on 11/08/2017)  . ipratropium-albuterol (DUONEB) 0.5-2.5 (3) MG/3ML SOLN Take 3 mLs by nebulization 4 (four) times daily as needed (for shortness of breath).  . mometasone-formoterol (DULERA) 200-5 MCG/ACT AERO Inhale 1 puff into the lungs 2 (two) times daily.   . potassium chloride (K-DUR,KLOR-CON) 10 MEQ tablet Take 10 mEq by mouth daily.   . Probiotic Product (PROBIOTIC-10) CAPS Take  1 capsule by mouth 2 (two) times daily.   Marland Kitchen. rOPINIRole (REQUIP) 0.25 MG tablet TAKE 1 TABLET BY MOUTH TWICE DAILY FOR CRAMPS  . Spacer/Aero-Holding Chambers (OPTICHAMBER ADVANTAGE-LG MASK) MISC Use as directed with inhaler  diag  j44.1  . theophylline (UNIPHYL) 400 MG 24 hr tablet Take 400 mg by mouth daily.   . Tiotropium Bromide Monohydrate (SPIRIVA RESPIMAT) 2.5 MCG/ACT AERS Inhale 2 puffs into the lungs daily.     FAMILY HISTORY:  His indicated that his mother is deceased. He indicated that his father is deceased. He indicated that the status of his neg hx is unknown.   SOCIAL HISTORY: He  reports that he has quit smoking. His smoking use included e-cigarettes and cigarettes. He has a 4.30 pack-year smoking history. he has never used smokeless tobacco. He reports that he does not drink alcohol or use drugs.  REVIEW OF SYSTEMS:  Unable to obtain as pt is encephalopathic.  SUBJECTIVE:  On vent, slightly agitated.   VITAL SIGNS: BP 108/77 (BP Location: Right Arm)   Pulse (!) 103   Temp 98.2 F (36.8 C) (Axillary)   Resp 16   Ht 5\' 7"  (1.702 m)   Wt 98.4 kg (217 lb)   SpO2 100%   BMI 33.99 kg/m   HEMODYNAMICS:    VENTILATOR SETTINGS: Vent Mode: PRVC FiO2 (%):  [50 %-100 %] 100 % Set Rate:  [18 bmp] 18 bmp Vt Set:  [530 mL] 530 mL PEEP:  [5 cmH20] 5 cmH20 Plateau Pressure:  [15 cmH20-16 cmH20] 16 cmH20  INTAKE / OUTPUT: No intake/output data recorded.  PHYSICAL EXAMINATION: General: Adult male, somewhat agitated, in NAD. Neuro: Sedated but slightly agitated, does follow basic commands. HEENT: Ricardo/AT. EOMI, sclerae anicteric. ETT in place. Cardiovascular: RRR, no M/R/G.  Lungs: Respirations even and unlabored.  Coarse bilaterally. Abdomen: BS x 4, soft, NT/ND.  Musculoskeletal: No gross deformities, 1+ edema.  Skin: Intact, warm, no rashes.  LABS:  BMET Recent Labs  Lab 11/22/17 1048  NA 142  K 4.4  CL 98*  CO2 36*  BUN 15  CREATININE 1.36*  GLUCOSE 124*    Electrolytes Recent Labs  Lab 11/22/17 1048  CALCIUM 9.1    CBC Recent Labs  Lab 11/22/17 1048  WBC 9.8  HGB 12.5*  HCT 40.6  PLT 188    Coag's No results for input(s): APTT, INR in the last 168  hours.  Sepsis Markers No results for input(s): LATICACIDVEN, PROCALCITON, O2SATVEN in the last 168 hours.  ABG Recent Labs  Lab 11/22/17 1125 11/22/17 1525  PHART 7.045* 7.283*  PCO2ART PENDING 71.3*  PO2ART 55.8* 181*    Liver Enzymes Recent Labs  Lab 11/22/17 1048  AST 23  ALT 29  ALKPHOS 103  BILITOT 0.9  ALBUMIN 3.4*    Cardiac Enzymes Recent Labs  Lab 11/22/17 1048  TROPONINI 0.08*    Glucose No results for input(s): GLUCAP in the last 168 hours.  Imaging Dg Chest Port 1 View  Result Date: 11/22/2017 CLINICAL DATA:  Severe shortness of breath, productive cough EXAM: PORTABLE CHEST 1 VIEW COMPARISON:  10/19/2017 FINDINGS: Cardiomegaly. Vascular congestion. Interstitial and alveolar opacities throughout the lungs, likely edema. More confluent opacity at the right lung base could reflect asymmetric edema or infection. IMPRESSION: Cardiomegaly with mild pulmonary edema. More confluent opacity at the right lung base could reflect an area of asymmetric edema or infection. Electronically Signed   By: Charlett NoseKevin  Dover M.D.   On: 11/22/2017 10:47  Dg Chest Port 1v Same Day  Result Date: 11/22/2017 CLINICAL DATA:  POST ET TUBE INTUBATION EXAM: PORTABLE CHEST 1 VIEW COMPARISON:  11/22/2017 FINDINGS: Portable exam was performed on 09/05/2009 showing interval placement of endotracheal tube and 7.7 centimeters above the carina. Heart is enlarged. There are perihilar and basilar opacities compatible with edema LEFT pleural effusion is present. IMPRESSION: Interval placement of endotracheal tube. Pulmonary edema. Electronically Signed   By: Norva Pavlov M.D.   On: 11/22/2017 12:31     STUDIES:  CXR 3/7 >>> CM with pulmonary edema.  CULTURES: BC x 2 3/7>>> Sputum 3/7>>>  ANTIBIOTICS: Rocephin 3/7>>>3/7 azithro 3/7>>>3/7 vanc 3/7>>>  Cefepime 3/7>>>  SIGNIFICANT EVENTS: 3/7 tx from [facility] ER, intubated, HCAP +/- pulmonary edema   LINES/TUBES: ETT  3/7>>>  DISCUSSION: 62 year old male with acute on chronic respiratory failure secondary to HCAP +/- volume overload and probable acute exacerbation of COPD.  Intubated in [facility] ER, transferred to [facility] 3/7.  ASSESSMENT / PLAN:  PULMONARY A: Respiratory insufficiency - failed BiPAP and required intubation. HCAP. Acute pulmonary edema. Hx asthma, emphysema (based off CT scan, no PFT's on file), tobacco abuse. P:   Full vent support. Prior ABG noted with respiratory acidosis.  Repeat ABG now and adjust vent accordingly. VAP prevention measures. SBT in AM if able. Bronchial hygiene. See ID section. Budesonide, Brovana, Atrovent, Albuterol PRN. Hold preadmission proair, dulera, theophylline, spiriva. CXR in AM. Tobacco cessation counseling once extubated.  CARDIOVASCULAR A:  Troponin leak - suspect demand. Hx HTN, dCHF (Echo from Aug 2017 with EF 60 - 65%, G1DD). P:  Trend troponin. 40mg  lasix now. Continue diuresis as BP and SCr permit, goal neg balance.  RENAL A:   AKI. Volume overload. P:   NS @ KVO. 40mg  lasix now. BMP in AM.  GASTROINTESTINAL A:   Hx GERD. Nutrition. P:   SUP: Pantoprazole. NPO.  HEMATOLOGIC A:   VTE Prophylaxis. P:  SCD's / heparin. CBC in AM.  INFECTIOUS A:   HCAP. P:   Abx as above (vanc / cefepime).  Follow cultures as above. PCT algorithm to limit abx exposure.  ENDOCRINE A:   No acute issues.  P:   No interventions required.  NEUROLOGIC A:   Sedation needs due to mechanical ventilation. Hx depression, anxiety. P:   Sedation:  Propofol gtt / Fentanyl PRN. RASS goal: 0 to -1. Daily WUA. Hold preadmission alprazolam, escitalopram, gabapentin, ropinirole.  Family updated: None available.  Interdisciplinary Family Meeting v Palliative Care Meeting:  Due by: 11/28/17.  CC time: 30 min.   Rutherford Guys, [place] - [facility] [place] [facility] Pager: [phone]  or [phone] 11/22/2017, 5:58 PM

## 2017-11-22 NOTE — Progress Notes (Signed)
Advanced ETT tube to 25 without any complications, per MD verbal order.

## 2017-11-22 NOTE — ED Notes (Signed)
Pt harder to arouse.  Remains on bi-pap.  sats in the 80's.  ABG ordered

## 2017-11-22 NOTE — ED Notes (Signed)
Rechecking ABG .  No change in pt status.  Preparing to intubate Dr. Adriana Simasook at bedside.

## 2017-11-22 NOTE — ED Notes (Signed)
glidescope used.  Size 8 tube.  Good color change with CO2 monitor.  Taped 22 at the lip.

## 2017-11-23 ENCOUNTER — Inpatient Hospital Stay (HOSPITAL_COMMUNITY): Payer: Medicare Other

## 2017-11-23 ENCOUNTER — Other Ambulatory Visit: Payer: Self-pay

## 2017-11-23 DIAGNOSIS — I361 Nonrheumatic tricuspid (valve) insufficiency: Secondary | ICD-10-CM

## 2017-11-23 LAB — CBC
HEMATOCRIT: 37.2 % — AB (ref 39.0–52.0)
HEMOGLOBIN: 11.1 g/dL — AB (ref 13.0–17.0)
MCH: 31.8 pg (ref 26.0–34.0)
MCHC: 29.8 g/dL — AB (ref 30.0–36.0)
MCV: 106.6 fL — AB (ref 78.0–100.0)
Platelets: 208 10*3/uL (ref 150–400)
RBC: 3.49 MIL/uL — ABNORMAL LOW (ref 4.22–5.81)
RDW: 12.8 % (ref 11.5–15.5)
WBC: 7.9 10*3/uL (ref 4.0–10.5)

## 2017-11-23 LAB — TROPONIN I
TROPONIN I: 0.12 ng/mL — AB (ref <0.03)
TROPONIN I: 0.08 ng/mL — AB (ref <0.03)
TROPONIN I: 0.07 ng/mL — AB (ref <0.03)
Troponin I: 0.23 ng/mL (ref <0.03)

## 2017-11-23 LAB — RESPIRATORY PANEL BY PCR
Adenovirus: NOT DETECTED
BORDETELLA PERTUSSIS-RVPCR: NOT DETECTED
Chlamydophila pneumoniae: NOT DETECTED
Coronavirus 229E: NOT DETECTED
Coronavirus HKU1: NOT DETECTED
Coronavirus NL63: NOT DETECTED
Coronavirus OC43: NOT DETECTED
INFLUENZA A-RVPPCR: NOT DETECTED
INFLUENZA B-RVPPCR: NOT DETECTED
METAPNEUMOVIRUS-RVPPCR: NOT DETECTED
MYCOPLASMA PNEUMONIAE-RVPPCR: NOT DETECTED
PARAINFLUENZA VIRUS 4-RVPPCR: NOT DETECTED
Parainfluenza Virus 1: NOT DETECTED
Parainfluenza Virus 2: NOT DETECTED
Parainfluenza Virus 3: NOT DETECTED
Respiratory Syncytial Virus: NOT DETECTED
Rhinovirus / Enterovirus: DETECTED — AB

## 2017-11-23 LAB — GLUCOSE, CAPILLARY
GLUCOSE-CAPILLARY: 130 mg/dL — AB (ref 65–99)
Glucose-Capillary: 129 mg/dL — ABNORMAL HIGH (ref 65–99)
Glucose-Capillary: 129 mg/dL — ABNORMAL HIGH (ref 65–99)
Glucose-Capillary: 135 mg/dL — ABNORMAL HIGH (ref 65–99)
Glucose-Capillary: 164 mg/dL — ABNORMAL HIGH (ref 65–99)
Glucose-Capillary: 179 mg/dL — ABNORMAL HIGH (ref 65–99)

## 2017-11-23 LAB — BASIC METABOLIC PANEL
ANION GAP: 19 — AB (ref 5–15)
BUN: 23 mg/dL — ABNORMAL HIGH (ref 6–20)
CHLORIDE: 103 mmol/L (ref 101–111)
CO2: 23 mmol/L (ref 22–32)
CREATININE: 1.75 mg/dL — AB (ref 0.61–1.24)
Calcium: 9 mg/dL (ref 8.9–10.3)
GFR calc non Af Amer: 40 mL/min — ABNORMAL LOW (ref >60)
GFR, EST AFRICAN AMERICAN: 46 mL/min — AB (ref >60)
GLUCOSE: 144 mg/dL — AB (ref 65–99)
Potassium: 4.8 mmol/L (ref 3.5–5.1)
Sodium: 145 mmol/L (ref 135–145)

## 2017-11-23 LAB — BLOOD GAS, ARTERIAL
ACID-BASE EXCESS: 7 mmol/L — AB (ref 0.0–2.0)
BICARBONATE: 32.8 mmol/L — AB (ref 20.0–28.0)
DRAWN BY: 41422
FIO2: 60
MECHVT: 600 mL
O2 Saturation: 95.6 %
PATIENT TEMPERATURE: 98.6
PCO2 ART: 64.2 mmHg — AB (ref 32.0–48.0)
PEEP/CPAP: 5 cmH2O
PO2 ART: 82.3 mmHg — AB (ref 83.0–108.0)
RATE: 20 resp/min
pH, Arterial: 7.329 — ABNORMAL LOW (ref 7.350–7.450)

## 2017-11-23 LAB — ECHOCARDIOGRAM COMPLETE
HEIGHTINCHES: 70 in
Weight: 3516.78 oz

## 2017-11-23 LAB — MAGNESIUM: MAGNESIUM: 2.2 mg/dL (ref 1.7–2.4)

## 2017-11-23 LAB — MRSA PCR SCREENING: MRSA by PCR: INVALID — AB

## 2017-11-23 LAB — PHOSPHORUS: Phosphorus: 2.7 mg/dL (ref 2.5–4.6)

## 2017-11-23 LAB — PROCALCITONIN: Procalcitonin: 0.95 ng/mL

## 2017-11-23 MED ORDER — IPRATROPIUM-ALBUTEROL 0.5-2.5 (3) MG/3ML IN SOLN
3.0000 mL | Freq: Four times a day (QID) | RESPIRATORY_TRACT | Status: DC
  Administered 2017-11-23 – 2017-11-27 (×17): 3 mL via RESPIRATORY_TRACT
  Filled 2017-11-23 (×17): qty 3

## 2017-11-23 MED ORDER — VANCOMYCIN HCL IN DEXTROSE 750-5 MG/150ML-% IV SOLN
750.0000 mg | Freq: Two times a day (BID) | INTRAVENOUS | Status: DC
  Administered 2017-11-23 – 2017-11-24 (×2): 750 mg via INTRAVENOUS
  Filled 2017-11-23 (×2): qty 150

## 2017-11-23 NOTE — Consult Note (Signed)
   [facility]   11/23/2017  Dustin [name] 11/06/1955 [id number]   [name]. [facility] and [facility] [name]. Please see chart review tab then encounters for patient outreach details from [facility].  Dustin [name] could benefit from palliative care discussion regarding GOC given multiple medical problems and frequent hospitalizations.  Left voicemail for (covering) inpatient RNCM Barnie Del([place]) to make aware of above notes.  Will continue to follow.   Raiford NobleAtika Hall, MSN-Ed, RN,BSN [facility] Liaison [phone]

## 2017-11-23 NOTE — Progress Notes (Addendum)
PULMONARY / CRITICAL CARE MEDICINE   Name: Dustin [name] MRN: [id number] DOB: 07-Jun-1956    ADMISSION DATE:  11/22/2017 CONSULTATION DATE:  3/7  REFERRING MD:  Jeani Hawking   CHIEF COMPLAINT: Respiratory failure  HISTORY OF PRESENT ILLNESS:   62 year old male with history of COPD with ongoing tobacco abuse, chronic diastolic CHF, hypertension, recent pneumonia treated as outpt presented 3/7 to [facility] ER in profound respiratory distress.  Per EMS patient had prodromal URI symptoms for several days prior to admission.  He had significant respiratory distress treated with BiPAP in route, intubated quickly on arrival to ER.  Chest x-ray suspicious for pneumonia with overlying pulmonary edema.  PCCM was consulted for transfer to [facility] for ongoing ICU care and ventilator management.    Most recent hospitalization 09/2017 for AECOPD.   SUBJECTIVE:  Labile BP overnight, now resolved.  No additional acute events.  VITAL SIGNS: BP 98/68   Pulse 90   Temp 98.9 F (37.2 C) (Oral)   Resp 20   Ht 5\' 10"  (1.778 m)   Wt 99.7 kg (219 lb 12.8 oz)   SpO2 98%   BMI 31.54 kg/m   VENTILATOR SETTINGS: Vent Mode: PRVC FiO2 (%):  [50 %-100 %] 60 % Set Rate:  [16 bmp-20 bmp] 20 bmp Vt Set:  [530 mL-600 mL] 600 mL PEEP:  [5 cmH20] 5 cmH20 Plateau Pressure:  [13 cmH20-20 cmH20] 20 cmH20  INTAKE / OUTPUT: I/O last 3 completed shifts: In: 742.7 [I.V.:112.7; NG/GT:30; IV Piggyback:600] Out: 1050 [Urine:850; Emesis/NG output:200]  PHYSICAL EXAMINATION: General: Adult male,  in NAD. Neuro: Sedated but opens eyes to voice and follows basic commands. HEENT: [place]/AT. EOMI, sclerae anicteric. ETT in place. Cardiovascular: RRR, no M/R/G.  Lungs: Respirations even and unlabored.  Faint basilar crackles. Abdomen: BS x 4, soft, NT/ND.  Musculoskeletal: No gross deformities, no edema. Skin: Intact, warm, no rashes.  LABS:  BMET Recent Labs  Lab 11/22/17 1048 11/23/17 0244  NA 142 145  K 4.4  4.8  CL 98* 103  CO2 36* 23  BUN 15 23*  CREATININE 1.36* 1.75*  GLUCOSE 124* 144*    Electrolytes Recent Labs  Lab 11/22/17 1048 11/23/17 0244  CALCIUM 9.1 9.0  MG  --  2.2  PHOS  --  2.7    CBC Recent Labs  Lab 11/22/17 1048 11/23/17 0244  WBC 9.8 7.9  HGB 12.5* 11.1*  HCT 40.6 37.2*  PLT 188 208    Coag's No results for input(s): APTT, INR in the last 168 hours.  Sepsis Markers Recent Labs  Lab 11/22/17 2000 11/23/17 0244  PROCALCITON 0.94 0.95    ABG Recent Labs  Lab 11/22/17 1829 11/22/17 2218 11/23/17 0445  PHART 7.207* 7.370 7.329*  PCO2ART >91.0* 56.9* 64.2*  PO2ART 135.0* 75.0* 82.3*    Liver Enzymes Recent Labs  Lab 11/22/17 1048  AST 23  ALT 29  ALKPHOS 103  BILITOT 0.9  ALBUMIN 3.4*    Cardiac Enzymes Recent Labs  Lab 11/22/17 1048 11/22/17 2000 11/23/17 0244  TROPONINI 0.08* 0.23* 0.23*  0.12*    Glucose Recent Labs  Lab 11/22/17 2048 11/22/17 2328 11/23/17 0432  GLUCAP 119* 149* 164*    Imaging Dg Chest Port 1 View  Result Date: 11/22/2017 CLINICAL DATA:  Respiratory failure EXAM: PORTABLE CHEST 1 VIEW COMPARISON:  None. FINDINGS: Endotracheal tube tip is about 5.2 cm superior to the carina. Esophageal tube tip is below the diaphragm but non included. Emphysematous disease in the  upper lobes. Small left greater than right pleural effusion. Interstitial and airspace disease at the bilateral bases. Mild cardiomegaly. No pneumothorax. Possible spiculated opacity in the right mid lung IMPRESSION: 1. Endotracheal tube tip about 5.2 cm superior to carina. Esophageal tube tip below the diaphragm but non included 2. Emphysematous disease. Small left greater than right pleural effusions with mixed interstitial and alveolar airspace disease greatest at the bases which may reflect edema or pneumonia. 3. Cardiomegaly 4. Possible spiculated opacity in the right mid lung. CT chest could be obtained to further evaluate. Electronically  Signed   By: Jasmine Pang M.D.   On: 11/22/2017 20:15   Dg Chest Port 1 View  Result Date: 11/22/2017 CLINICAL DATA:  Severe shortness of breath, productive cough EXAM: PORTABLE CHEST 1 VIEW COMPARISON:  10/19/2017 FINDINGS: Cardiomegaly. Vascular congestion. Interstitial and alveolar opacities throughout the lungs, likely edema. More confluent opacity at the right lung base could reflect asymmetric edema or infection. IMPRESSION: Cardiomegaly with mild pulmonary edema. More confluent opacity at the right lung base could reflect an area of asymmetric edema or infection. Electronically Signed   By: Charlett Nose M.D.   On: 11/22/2017 10:47   Dg Chest Port 1v Same Day  Result Date: 11/22/2017 CLINICAL DATA:  POST ET TUBE INTUBATION EXAM: PORTABLE CHEST 1 VIEW COMPARISON:  11/22/2017 FINDINGS: Portable exam was performed on 09/05/2009 showing interval placement of endotracheal tube and 7.7 centimeters above the carina. Heart is enlarged. There are perihilar and basilar opacities compatible with edema LEFT pleural effusion is present. IMPRESSION: Interval placement of endotracheal tube. Pulmonary edema. Electronically Signed   By: Norva Pavlov M.D.   On: 11/22/2017 12:31   [place] Ekg Site Rite  Result Date: 11/22/2017 If Site Rite image not attached, placement could not be confirmed due to current cardiac rhythm.   STUDIES:  CXR 3/7 >>> CM with pulmonary edema.  CULTURES: BC x 2 3/7>>>  Sputum 3/7>>> few GNR's, rare GPC's >>>   ANTIBIOTICS: Rocephin 3/7>>>3/7 azithro 3/7>>>3/7 vanc 3/7>>>  Cefepime 3/7>>>  SIGNIFICANT EVENTS: 3/7 tx from [facility] ER, intubated, HCAP +/- pulmonary edema   LINES/TUBES: ETT 3/7>>>  DISCUSSION: 62 year old male with acute on chronic respiratory failure secondary to HCAP +/- volume overload and probable acute exacerbation of COPD.  Intubated in [facility] ER, transferred to [facility] 3/7.  ASSESSMENT / PLAN:  PULMONARY A: Respiratory insufficiency - failed  BiPAP and required intubation. HCAP. Acute pulmonary edema - responded well to lasix. Hx asthma, emphysema (based off CT scan, no PFT's on file), tobacco abuse. P:   Full vent support. ABG noted (respiratory acidosis), will increase rate to 24. VAP prevention measures. Daily SBT - will hold for now as has been on 60% FiO2 overnight.  Just decreased to 40% on rounds this AM, if tolerates then will try wean later this AM. Bronchial hygiene. See ID section. Budesonide, Brovana, Atrovent, Albuterol PRN. Hold preadmission proair, dulera, theophylline, spiriva. Follow CXR. Tobacco cessation counseling once extubated.  CARDIOVASCULAR A:  Troponin leak - suspect demand. Hx HTN, dCHF (Echo from Aug 2017 with EF 60 - 65%, G1DD). P:  Continue diuresis as BP and SCr permit, goal neg balance.  Holding further this AM given labile BP's overnight.  RENAL A:   AKI. Volume overload - net - thus far. P:   NS @ KVO. Further diuresis on hold for now. BMP in AM.  GASTROINTESTINAL A:   Hx GERD. Nutrition. P:   SUP: Pantoprazole. NPO.  HEMATOLOGIC A:  VTE Prophylaxis. P:  SCD's / heparin. CBC in AM.  INFECTIOUS A:   HCAP. P:   Abx as above (vanc / cefepime).  Follow cultures as above. PCT algorithm to limit abx exposure.  ENDOCRINE A:   Hyperglycemia. P:   SSI.  NEUROLOGIC A:   Sedation needs due to mechanical ventilation. Hx depression, anxiety. P:   Sedation:  Propofol gtt / Fentanyl PRN. RASS goal: 0 to -1. Daily WUA. Hold preadmission alprazolam, escitalopram, gabapentin, ropinirole.  Family updated: None available.  Interdisciplinary Family Meeting v Palliative Care Meeting:  Due by: 11/28/17.  CC time: 30 min.  Rutherford Guys, [place] Sidonie Dickens Pulmonary & Critical Care Medicine Pager: [phone]  or [phone] 11/23/2017, 8:05 AM  ^^^^^^^^^^ P CCM Attending Note I have seen and examined the patient. Agree with APP's Assessment and  plan. Please see my comments as follows.  62 y/o male with copd, dCHF, Enterovirus- tracheobronchitis, acute hypercapnic resp failure required intubation/MV,  diuresed well with lasix, Failed SBT this am (leass than 15 min on SBT),  HD stable Vent parameters adjusted, Had severe bronchospasm-  Responding to steroids iv, cxr- minimal basilar interstitial markings, no infiltrate, Mild renal insufficiency sec to lasix,  11-22-2017 Rhinovirus / Enterovirus  DETECTED Abnormal     Results for IZAYIAH, TIBBITTS (MRN [id number]) as of 11/23/2017 12:08  Ref. Range 11/23/2017 04:45  pH, Arterial Latest Ref Range: 7.350 - 7.450  7.329 (L)  pCO2 arterial Latest Ref Range: 32.0 - 48.0 mmHg 64.2 (H)  pO2, Arterial Latest Ref Range: 83.0 - 108.0 mmHg 82.3 (L)  Acid-Base Excess Latest Ref Range: 0.0 - 2.0 mmol/L 7.0 (H)  Bicarbonate Latest Ref Range: 20.0 - 28.0 mmol/L 32.8 (H)   Results for ALCIDE, MEMOLI (MRN [id number]) as of 11/23/2017 12:08  Ref. Range 11/23/2017 02:44 11/23/2017 02:44 11/23/2017 04:45 11/23/2017 07:23  Sodium Latest Ref Range: 135 - 145 mmol/L 145     Potassium Latest Ref Range: 3.5 - 5.1 mmol/L 4.8     Chloride Latest Ref Range: 101 - 111 mmol/L 103     CO2 Latest Ref Range: 22 - 32 mmol/L 23     Glucose Latest Ref Range: 65 - 99 mg/dL 952 (H)     BUN Latest Ref Range: 6 - 20 mg/dL 23 (H)     Creatinine Latest Ref Range: 0.61 - 1.24 mg/dL 8.41 (H)     Calcium Latest Ref Range: 8.9 - 10.3 mg/dL 9.0     Anion gap Latest Ref Range: 5 - 15  19 (H)     Phosphorus Latest Ref Range: 2.5 - 4.6 mg/dL 2.7     Magnesium Latest Ref Range: 1.7 - 2.4 mg/dL 2.2     GFR, Est Non African American Latest Ref Range: >60 mL/min 40 (L)     GFR, Est African American Latest Ref Range: >60 mL/min 46 (L)     Troponin I Latest Ref Range: <0.03 ng/mL 0.12 (HH) 0.23 (HH)  0.08 (HH)  Procalcitonin Latest Units: ng/mL 0.95     WBC Latest Ref Range: 4.0 - 10.5 K/uL 7.9     RBC Latest Ref Range: 4.22 - 5.81 MIL/uL  3.49 (L)     Hemoglobin Latest Ref Range: 13.0 - 17.0 g/dL 32.4 (L)     HCT Latest Ref Range: 39.0 - 52.0 % 37.2 (L)     MCV Latest Ref Range: 78.0 - 100.0 fL 106.6 (H)     MCH  Latest Ref Range: 26.0 - 34.0 pg 31.8     MCHC Latest Ref Range: 30.0 - 36.0 g/dL 16.129.8 (L)     RDW Latest Ref Range: 11.5 - 15.5 % 12.8     Platelets Latest Ref Range: 150 - 400 K/uL 208     11-23-2017 cxr Endotracheal tube with the tip 5.5 cm above the carina. Nasogastric tube coursing below the diaphragm. Hyperinflated lungs consistent with COPD. Right upper lobe scarring again noted. Chronic interstitial thickening at the lung bases bilaterally. No focal consolidation, pleural effusion or pneumothorax. Stable cardiomediastinal silhouette. No acute osseous abnormality. Results for Hershal CoriaWINBURN, Shahram R (MRN [id number]) as of 11/23/2017 12:08  Ref. Range 11/23/2017 07:23  Troponin I Latest Ref Range: <0.03 ng/mL 0.08 [facility])  11-23-17 Ekg Sinus rhythm with Premature supraventricular complexes with junctional escape complexes Right bundle branch block  11-23-2017 Echo - Left ventricle: The cavity size was normal. Wall thickness was   increased in a pattern of mild LVH. Systolic function was normal.   The estimated ejection fraction was in the range of 50% to 55%.   Basal inferior wall hypokinesis. Doppler parameters are   consistent with abnormal left ventricular relaxation (grade 1   diastolic dysfunction). The E/e&' ratio is >15, suggesting   elevated LV filling pressure. - Aorta: The aortic root is dilated. Aortic root dimension: 45 mm   (ED). - Mitral valve: Mildly thickened leaflets . No significant MV   inflow variation with respiration. There was trivial   regurgitation. - Left atrium: The atrium was normal in size. - Right ventricle: The cavity size was mildly dilated. Wall   thickness was normal. Systolic function is reduced toward the RV   apex. - Right atrium: The atrium was mildly dilated. - Tricuspid  valve: There was mild regurgitation. No significant TV   inflow variation with respiration. - Pulmonary arteries: PA peak pressure: 45 mm Hg (S). - Inferior vena cava: The vessel was dilated. The respirophasic   diameter changes were blunted (< 50%), consistent with elevated   central venous pressure. - Pericardium, extracardiac: A trivial pericardial effusion was   identified. Tamponade physiology cannot be excluded.    Plan  Continue prvc mode, Re-try SBT in next  24 hrs;  On steroids, BD, broad antibx-(can be deescalated once c/s results in next 24 hrs).  Cautious diuresis further.  Monitor renal function.    Thank you for letting me participate in the care of your patient. ^^^^^^^^^^ I  Have personally spent   50 Minutes  In the care of this Patient providing Critical care Services; Time includes review of chart, labs, imaging, coordinating care with other physicians and healthcare team members. Also includes time for frequent reevaluation and additional treatment implementation due to change in clinical condiiton of patient. Excludes time spent for Procedure and Teaching.  ^^^^^^^^^^ Note subject to typographical and grammatical errors;   Any formal questions or concerns about the content, text, or information contained within the body of this dictation should be directly addressed to the physician  for  clarification.  Caryl ComesSiva P Sivakumar, MD Pulmonary and Critical Care Medicine [place] [facility] Critical [facility] Pager: [phone]

## 2017-11-23 NOTE — Progress Notes (Signed)
  Echocardiogram 2D Echocardiogram has been performed.  Dustin [name] [name] 11/23/2017, 10:12 AM

## 2017-11-23 NOTE — Plan of Care (Signed)
  Progressing Clinical Measurements: Ability to maintain clinical measurements within normal limits will improve 11/23/2017 0842 - Progressing by Burman RiisKeaton, Jetaime Pinnix P, RN Will remain free from infection 11/23/2017 0842 - Progressing by Burman RiisKeaton, Adonus Uselman P, RN Diagnostic test results will improve 11/23/2017 0842 - Progressing by Burman RiisKeaton, Ravyn Nikkel P, RN Respiratory complications will improve 11/23/2017 0842 - Progressing by Burman RiisKeaton, Jenniferann Stuckert P, RN Cardiovascular complication will be avoided 11/23/2017 0842 - Progressing by Burman RiisKeaton, Latoiya Maradiaga P, RN Coping: Level of anxiety will decrease 11/23/2017 0842 - Progressing by Burman RiisKeaton, Jyllian Haynie P, RN Elimination: Will not experience complications related to bowel motility 11/23/2017 0842 - Progressing by Burman RiisKeaton, Harleen Fineberg P, RN Will not experience complications related to urinary retention 11/23/2017 0842 - Progressing by Burman RiisKeaton, Makyra Corprew P, RN Pain Managment: General experience of comfort will improve 11/23/2017 0842 - Progressing by Burman RiisKeaton, Olga Bourbeau P, RN Safety: Ability to remain free from injury will improve 11/23/2017 0842 - Progressing by Burman RiisKeaton, Lacey Dotson P, RN Skin Integrity: Risk for impaired skin integrity will decrease 11/23/2017 0842 - Progressing by Burman RiisKeaton, Kraig Genis P, RN Respiratory: Ability to maintain a clear airway and adequate ventilation will improve 11/23/2017 0842 - Progressing by Burman RiisKeaton, Toniya Rozar P, RN Role Relationship: Method of communication will improve 11/23/2017 0842 - Progressing by Burman RiisKeaton, Ponciano Shealy P, RN

## 2017-11-23 NOTE — Progress Notes (Signed)
Pharmacy Antibiotic Note  Dustin [name] is a 62 y.o. male admitted on 11/22/2017 with profound respiratory distress and concern for pneumonia.  CXR shows a RLL pneumonia. Pharmacy has been consulted for Vancomycin and Cefepime dosing.  -WBC= 7.9, tmax= 100.6, PCT= 0.95 -SCr= 1.75 (up), CrCl ~ 50 -cultures pending (respiratory cultures with GNR, GPC)  Plan: Continue Cefepime 1g IV every 8 hours.  Change vancomycin to 750 mg IV every 12 hours.  Monitor renal function, clinical status, and culture results.  Narrow therapy as able.  Vancomycin trough at Css if continued.   Height: 5\' 10"  (177.8 cm) Weight: 219 lb 12.8 oz (99.7 kg) IBW/kg (Calculated) : 73  Temp (24hrs), Avg:99.2 F (37.3 C), Min:98.2 F (36.8 C), Max:100.6 F (38.1 C)  Recent Labs  Lab 11/22/17 1048 11/23/17 0244  WBC 9.8 7.9  CREATININE 1.36* 1.75*    Estimated Creatinine Clearance: 51.2 mL/min (A) (by C-G formula based on SCr of 1.75 mg/dL (H)).    Allergies  Allergen Reactions  . Asa [Aspirin] Other (See Comments)    Reaction: swelling of the right side.  . Bee Venom Swelling  . Codeine Other (See Comments)    Reaction: Difficulty breathing  . Ibuprofen Other (See Comments)    Reaction: Swelling of the right side.  . Iodinated Diagnostic Agents Other (See Comments)    Reaction:  Unknown  Other reaction(s): Unknown    Antimicrobials this admission: Azithromycin 3/7 x1 Ceftriaxone 3/7 x1 Cefepime 3/7 >> Vancomycin 3/7 >>  Dose adjustments this admission:   Microbiology results: 3/7 Resp cx >> GNR, GPC 3/7 resp- GNR 3/7 RVP >> 3/7 Blood >> 3/7 Urine >> 3/7 MRSA PCR  Thank you for allowing pharmacy to be a part of this patient's care.  Harland GermanAndrew Seila Liston, PharmD Clinical Pharmacist Clinical phone from 8:30-4:00 is [phone] After 4pm, please call Main Rx [phone] for assistance. 11/23/2017 9:21 AM

## 2017-11-23 NOTE — Progress Notes (Signed)
Initial Nutrition Assessment  DOCUMENTATION CODES:   Obesity unspecified  INTERVENTION:   Recommend initiation of enteral nutrition within 24-48 hours  Tube Feeding:  Vital High Protein @ 30 ml/hr Pro-Stat 60 mL TID Provides 156 g of protein, 1320 kcals, 1109 mL of free water  TF regimen and propofol at current rate providing 1475 total kcal/day (112 % of kcal needs) per ASPEN guidelines for obese patient   NUTRITION DIAGNOSIS:   Inadequate oral intake related to acute illness as evidenced by NPO status.  GOAL:   Provide needs based on ASPEN/SCCM guidelines  MONITOR:   TF tolerance, Vent status, Labs, Weight trends  REASON FOR ASSESSMENT:   Ventilator    ASSESSMENT:   62 [facility] for respiratory failure requiring intubation, HCAP, acute pulmonary edema with hx of emphysema.  Pt with hx of COPD with ongoing tobacco abuse, chronic CHF, HTN  Patient is currently intubated on ventilator support  Propofol: 5.9 ml/hr  OG tube in place  No family at bedside  Labs: reviewed Meds: diprivan  NUTRITION - FOCUSED PHYSICAL EXAM:  Unable to assess at time of visit  Diet Order:  Diet NPO time specified  EDUCATION NEEDS:   Not appropriate for education at this time  Skin:  Skin Assessment: Reviewed RN Assessment  Last BM:  no documented BM  Height:   Ht Readings from Last 1 Encounters:  11/22/17 5\' 10"  (1.778 m)    Weight:   Wt Readings from Last 1 Encounters:  11/23/17 219 lb 12.8 oz (99.7 kg)    Ideal Body Weight:  75.5 kg  BMI:  Body mass index is 31.54 kg/m.  Estimated Nutritional Needs:   Kcal:  1100-1400 kcals  Protein:  151-189 g  Fluid:  >/= 2 L   Romelle Starcherate Dania Marsan MS, RD, LDN, CNSC [phone] Pager  [phone] Weekend/On-Call Pager

## 2017-11-24 ENCOUNTER — Inpatient Hospital Stay (HOSPITAL_COMMUNITY): Payer: Medicare Other

## 2017-11-24 LAB — URINE CULTURE: Culture: NO GROWTH

## 2017-11-24 LAB — BLOOD GAS, ARTERIAL
Acid-Base Excess: 6.6 mmol/L — ABNORMAL HIGH (ref 0.0–2.0)
Bicarbonate: 30.9 mmol/L — ABNORMAL HIGH (ref 20.0–28.0)
DRAWN BY: 41977
FIO2: 50
MECHVT: 600 mL
O2 SAT: 93.9 %
PCO2 ART: 46.4 mmHg (ref 32.0–48.0)
PEEP/CPAP: 5 cmH2O
PH ART: 7.438 (ref 7.350–7.450)
PO2 ART: 70.7 mmHg — AB (ref 83.0–108.0)
Patient temperature: 98.6
RATE: 24 resp/min

## 2017-11-24 LAB — GLUCOSE, CAPILLARY
GLUCOSE-CAPILLARY: 152 mg/dL — AB (ref 65–99)
GLUCOSE-CAPILLARY: 130 mg/dL — AB (ref 65–99)
Glucose-Capillary: 136 mg/dL — ABNORMAL HIGH (ref 65–99)
Glucose-Capillary: 137 mg/dL — ABNORMAL HIGH (ref 65–99)
Glucose-Capillary: 136 mg/dL — ABNORMAL HIGH (ref 65–99)
Glucose-Capillary: 141 mg/dL — ABNORMAL HIGH (ref 65–99)

## 2017-11-24 LAB — CBC
HCT: 31.8 % — ABNORMAL LOW (ref 39.0–52.0)
Hemoglobin: 10.3 g/dL — ABNORMAL LOW (ref 13.0–17.0)
MCH: 32.9 pg (ref 26.0–34.0)
MCHC: 32.4 g/dL (ref 30.0–36.0)
MCV: 101.6 fL — ABNORMAL HIGH (ref 78.0–100.0)
Platelets: 203 10*3/uL (ref 150–400)
RBC: 3.13 MIL/uL — ABNORMAL LOW (ref 4.22–5.81)
RDW: 12.9 % (ref 11.5–15.5)
WBC: 14.5 10*3/uL — ABNORMAL HIGH (ref 4.0–10.5)

## 2017-11-24 LAB — MRSA CULTURE: Culture: NOT DETECTED

## 2017-11-24 LAB — BASIC METABOLIC PANEL
Anion gap: 13 (ref 5–15)
BUN: 34 mg/dL — AB (ref 6–20)
CALCIUM: 9 mg/dL (ref 8.9–10.3)
CO2: 27 mmol/L (ref 22–32)
CREATININE: 1.73 mg/dL — AB (ref 0.61–1.24)
Chloride: 103 mmol/L (ref 101–111)
GFR calc non Af Amer: 40 mL/min — ABNORMAL LOW (ref >60)
GFR, EST AFRICAN AMERICAN: 47 mL/min — AB (ref >60)
Glucose, Bld: 147 mg/dL — ABNORMAL HIGH (ref 65–99)
Potassium: 3.7 mmol/L (ref 3.5–5.1)
SODIUM: 143 mmol/L (ref 135–145)

## 2017-11-24 LAB — MAGNESIUM
MAGNESIUM: 2.4 mg/dL (ref 1.7–2.4)
MAGNESIUM: 2.5 mg/dL — AB (ref 1.7–2.4)

## 2017-11-24 LAB — PHOSPHORUS
PHOSPHORUS: 4.3 mg/dL (ref 2.5–4.6)
Phosphorus: 3.2 mg/dL (ref 2.5–4.6)

## 2017-11-24 LAB — PROCALCITONIN: PROCALCITONIN: 0.69 ng/mL

## 2017-11-24 MED ORDER — PRO-STAT SUGAR FREE PO LIQD
30.0000 mL | Freq: Two times a day (BID) | ORAL | Status: DC
  Administered 2017-11-24 – 2017-11-25 (×3): 30 mL
  Filled 2017-11-24 (×3): qty 30

## 2017-11-24 MED ORDER — ALPRAZOLAM 0.5 MG PO TABS: 1.0000 mg | ORAL_TABLET | Freq: Three times a day (TID) | ORAL | Status: DC | PRN

## 2017-11-24 MED ORDER — POTASSIUM CHLORIDE 20 MEQ/15ML (10%) PO SOLN
40.0000 meq | Freq: Once | ORAL | Status: AC
  Administered 2017-11-24: 40 meq via ORAL
  Filled 2017-11-24: qty 30

## 2017-11-24 MED ORDER — VITAL HIGH PROTEIN PO LIQD
1000.0000 mL | ORAL | Status: DC
  Administered 2017-11-24: 1000 mL

## 2017-11-24 MED ORDER — METHYLPREDNISOLONE SODIUM SUCC 40 MG IJ SOLR
40.0000 mg | Freq: Three times a day (TID) | INTRAMUSCULAR | Status: DC
  Administered 2017-11-24 – 2017-11-27 (×8): 40 mg via INTRAVENOUS
  Filled 2017-11-24 (×9): qty 1

## 2017-11-24 MED ORDER — PANTOPRAZOLE SODIUM 40 MG PO PACK
40.0000 mg | PACK | ORAL | Status: DC
  Administered 2017-11-24: 40 mg
  Filled 2017-11-24: qty 20

## 2017-11-24 MED ORDER — FUROSEMIDE 10 MG/ML IJ SOLN
20.0000 mg | Freq: Once | INTRAMUSCULAR | Status: AC
  Administered 2017-11-24: 20 mg via INTRAVENOUS
  Filled 2017-11-24: qty 2

## 2017-11-24 MED ORDER — ALPRAZOLAM 0.5 MG PO TABS
1.0000 mg | ORAL_TABLET | Freq: Three times a day (TID) | ORAL | Status: DC | PRN
  Administered 2017-11-24 – 2017-11-25 (×3): 1 mg
  Filled 2017-11-24 (×3): qty 2

## 2017-11-24 MED ORDER — ROPINIROLE HCL 0.25 MG PO TABS
0.2500 mg | ORAL_TABLET | Freq: Two times a day (BID) | ORAL | Status: DC
  Administered 2017-11-24 – 2017-11-25 (×3): 0.25 mg
  Filled 2017-11-24 (×3): qty 1

## 2017-11-24 MED ORDER — ROPINIROLE HCL 0.25 MG PO TABS: 0.2500 mg | ORAL_TABLET | Freq: Two times a day (BID) | ORAL | Status: DC

## 2017-11-24 NOTE — Progress Notes (Signed)
eLink Physician-Brief Progress Note Patient Name: Dustin CoriaDennis R Buntin DOB: [birth year] MRN: [id number]   Date of Service  11/24/2017  HPI/Events of Note  Anxiety - Patient requests home Xanax  eICU Interventions  Will order: 1. Xanax 1 mg per tube TID PRN anxiety.      Intervention Category Minor Interventions: Agitation / anxiety - evaluation and management  Sommer,Steven Eugene 11/24/2017, 7:14 PM

## 2017-11-24 NOTE — Progress Notes (Signed)
PULMONARY / CRITICAL CARE MEDICINE   Name: Dustin CoriaDennis R Ghattas MRN: [id number] DOB: 27-Oct-1955    ADMISSION DATE:  11/22/2017 CONSULTATION DATE:  3/7  REFERRING MD:  Jeani HawkingAnnie [facility]   CHIEF COMPLAINT: Respiratory failure  HISTORY OF PRESENT ILLNESS:   62 year old male with history of COPD with ongoing tobacco abuse, chronic diastolic CHF, hypertension, recent pneumonia treated as outpt presented 3/7 to [facility] ER in profound respiratory distress.  Per EMS patient had prodromal URI symptoms for several days prior to admission.  He had significant respiratory distress treated with BiPAP in route, intubated quickly on arrival to ER.  Chest x-ray suspicious for pneumonia with overlying pulmonary edema.  PCCM was consulted for transfer to [facility] for ongoing ICU care and ventilator management.    Most recent hospitalization 09/2017 for AECOPD.   SUBJECTIVE:  No acute changes overnight.  Pt remains on propofol at 30 mcg.  Pt turned to PSV wean at 1010 5/5.    VITAL SIGNS: BP 118/72   Pulse 75   Temp 99.3 F (37.4 C) (Oral)   Resp (!) 24   Ht 5\' 10"  (1.778 m)   Wt 215 lb 6.2 oz (97.7 kg)   SpO2 93%   BMI 30.91 kg/m   VENTILATOR SETTINGS: Vent Mode: PRVC FiO2 (%):  [40 %-50 %] 50 % Set Rate:  [24 bmp] 24 bmp Vt Set:  [600 mL] 600 mL PEEP:  [5 cmH20] 5 cmH20 Pressure Support:  [5 cmH20] 5 cmH20 Plateau Pressure:  [15 cmH20-25 cmH20] 18 cmH20  INTAKE / OUTPUT: I/O last 3 completed shifts: In: 2217.6 [I.V.:817.6; IV Piggyback:1400] Out: 1445 [Urine:1245; Emesis/NG output:200]  PHYSICAL EXAMINATION: General: adult male in NAD lying in bed on vent, watching TV  HEENT: MM pink/moist, ETT Neuro: Awake, alert, follows commands, MAE  CV: s1s2 rrr, no m/r/g PULM: even/non-labored, lungs bilaterally diminished WU:JWJXGI:soft, non-tender, bsx4 active  Extremities: warm/dry, 2+ BLE pitting edema  Skin: no rashes or lesions  LABS:  BMET Recent Labs  Lab 11/22/17 1048 11/23/17 0244  11/24/17 0257  NA 142 145 143  K 4.4 4.8 3.7  CL 98* 103 103  CO2 36* 23 27  BUN 15 23* 34*  CREATININE 1.36* 1.75* 1.73*  GLUCOSE 124* 144* 147*    Electrolytes Recent Labs  Lab 11/22/17 1048 11/23/17 0244 11/24/17 0257  CALCIUM 9.1 9.0 9.0  MG  --  2.2 2.4  PHOS  --  2.7 3.2    CBC Recent Labs  Lab 11/22/17 1048 11/23/17 0244 11/24/17 0257  WBC 9.8 7.9 14.5*  HGB 12.5* 11.1* 10.3*  HCT 40.6 37.2* 31.8*  PLT 188 208 203    Coag's No results for input(s): APTT, INR in the last 168 hours.  Sepsis Markers Recent Labs  Lab 11/22/17 2000 11/23/17 0244 11/24/17 0257  PROCALCITON 0.94 0.95 0.69    ABG Recent Labs  Lab 11/22/17 2218 11/23/17 0445 11/24/17 0424  PHART 7.370 7.329* 7.438  PCO2ART 56.9* 64.2* 46.4  PO2ART 75.0* 82.3* 70.7*    Liver Enzymes Recent Labs  Lab 11/22/17 1048  AST 23  ALT 29  ALKPHOS 103  BILITOT 0.9  ALBUMIN 3.4*    Cardiac Enzymes Recent Labs  Lab 11/23/17 0244 11/23/17 0723 11/23/17 1413  TROPONINI 0.23*  0.12* 0.08* 0.07*    Glucose Recent Labs  Lab 11/23/17 1153 11/23/17 1559 11/23/17 2009 11/23/17 2332 11/24/17 0308 11/24/17 0804  GLUCAP 129* 135* 130* 129* 152* 136*    Imaging Dg Chest Port 1  View  Result Date: 11/24/2017 CLINICAL DATA:  Respiratory failure EXAM: PORTABLE CHEST 1 VIEW COMPARISON:  11/23/2017 FINDINGS: Support devices are stable. Cardiomegaly. Bilateral lower lobe airspace opacities are again noted, unchanged. No effusions or acute bony abnormality. Suspect underlying COPD. IMPRESSION: Continued bilateral lower lobe airspace opacities, unchanged. Underlying COPD.  Cardiomegaly. Electronically Signed   By: Charlett Nose M.D.   On: 11/24/2017 07:26    STUDIES:  CXR 3/7 >> CM with pulmonary edema.  CULTURES: BC x 2 3/7 >>  BCx2 3/7 >> UC 3/7 >> negative  Sputum 3/7 >>   Sputum 3/7 >>  RVP 3/7 >> rhinovirus positive   ANTIBIOTICS: Rocephin 3/7 >> 3/7 Azithro 3/7 >> 3/7 Vanc  3/7 >> 3/9 Cefepime 3/7 >>  SIGNIFICANT EVENTS: 3/07  Tx from [facility] ER, intubated, HCAP +/- pulmonary edema   LINES/TUBES: ETT 3/7 >>  DISCUSSION: 62 year old male with acute on chronic respiratory failure secondary to HCAP +/- volume overload and probable acute exacerbation of COPD.  Intubated in [facility] ER, transferred to [facility] 3/7.  ASSESSMENT / PLAN:  PULMONARY A: Respiratory insufficiency - failed BiPAP and required intubation. HCAP. Rhinovirus Positive  Acute pulmonary edema - responded well to lasix. Hx asthma, 2L O2 dependent COPD (based off CT scan, no PFT's on file), tobacco abuse. P:   PRVC 8 cc/kg  Wean PEEP / FiO2 for sats 88-94% Follow CXR  Daily SBT / WUA  Continue Budesonide + Brovana, atrovent and PRN albuterol  Hold home proair, dulera, theophylline, spiriva Smoking cessation counseling when able   CARDIOVASCULAR A:  Troponin leak - suspect demand. Hx HTN, dCHF (Echo from Aug 2017 with EF 60 - 65%, G1DD). P:  ICU monitoring Lasix 20 mg IV x1   RENAL A:   AKI. Volume overload - net - thus far. P:   KVO IVF  Trend BMP / urinary output Replace electrolytes as indicated Avoid nephrotoxic agents, ensure adequate renal perfusion  GASTROINTESTINAL A:   Hx GERD. Nutrition. P:   PPI for SUP  NPO / OGT  Consider TF if not extubated 3/9  HEMATOLOGIC A:   VTE Prophylaxis. P:  SCD's + heparin for DVT  Trend CBC   INFECTIOUS A:   HCAP. P:   ABX as above  Discontinue vancomycin  Follow cultures Trend PCT   ENDOCRINE A:   Hyperglycemia. P:   SSI   NEUROLOGIC A:   Sedation needs due to mechanical ventilation. Hx depression, anxiety. P:   Sedation:  propofol / PRN fentanyl  RASS goal: 0 to -1  Daily WUA  Hold home xanax, escitalopram, gabapentin, ropinirole   Family updated: No family at bedside am 3/9. Patient updated on plan of care.   Interdisciplinary Family Meeting v Palliative Care Meeting:  Due by:  11/28/17.  CC time: 30 minutes.   Canary Brim, NP-C [facility] Pgr: [phone] or if no answer [phone] 11/24/2017, 10:04 AM

## 2017-11-25 ENCOUNTER — Inpatient Hospital Stay (HOSPITAL_COMMUNITY): Payer: Medicare Other

## 2017-11-25 LAB — BASIC METABOLIC PANEL
Anion gap: 12 (ref 5–15)
BUN: 43 mg/dL — AB (ref 6–20)
CHLORIDE: 103 mmol/L (ref 101–111)
CO2: 28 mmol/L (ref 22–32)
Calcium: 8.9 mg/dL (ref 8.9–10.3)
Creatinine, Ser: 1.48 mg/dL — ABNORMAL HIGH (ref 0.61–1.24)
GFR calc non Af Amer: 49 mL/min — ABNORMAL LOW (ref >60)
GFR, EST AFRICAN AMERICAN: 56 mL/min — AB (ref >60)
Glucose, Bld: 167 mg/dL — ABNORMAL HIGH (ref 65–99)
POTASSIUM: 4.1 mmol/L (ref 3.5–5.1)
Sodium: 143 mmol/L (ref 135–145)

## 2017-11-25 LAB — CBC
HEMATOCRIT: 35.5 % — AB (ref 39.0–52.0)
HEMOGLOBIN: 10.9 g/dL — AB (ref 13.0–17.0)
MCH: 31.4 pg (ref 26.0–34.0)
MCHC: 30.7 g/dL (ref 30.0–36.0)
MCV: 102.3 fL — ABNORMAL HIGH (ref 78.0–100.0)
Platelets: 228 10*3/uL (ref 150–400)
RBC: 3.47 MIL/uL — AB (ref 4.22–5.81)
RDW: 13.5 % (ref 11.5–15.5)
WBC: 11.4 10*3/uL — ABNORMAL HIGH (ref 4.0–10.5)

## 2017-11-25 LAB — GLUCOSE, CAPILLARY
Glucose-Capillary: 146 mg/dL — ABNORMAL HIGH (ref 65–99)
Glucose-Capillary: 148 mg/dL — ABNORMAL HIGH (ref 65–99)
Glucose-Capillary: 137 mg/dL — ABNORMAL HIGH (ref 65–99)
Glucose-Capillary: 137 mg/dL — ABNORMAL HIGH (ref 65–99)

## 2017-11-25 LAB — CULTURE, RESPIRATORY W GRAM STAIN: Culture: NORMAL

## 2017-11-25 LAB — MAGNESIUM: MAGNESIUM: 2.7 mg/dL — AB (ref 1.7–2.4)

## 2017-11-25 LAB — CULTURE, RESPIRATORY

## 2017-11-25 LAB — PHOSPHORUS: Phosphorus: 4.8 mg/dL — ABNORMAL HIGH (ref 2.5–4.6)

## 2017-11-25 MED ORDER — VITAL HIGH PROTEIN PO LIQD: 1000.0000 mL | ORAL | Status: DC

## 2017-11-25 MED ORDER — PANTOPRAZOLE SODIUM 40 MG PO TBEC
40.0000 mg | DELAYED_RELEASE_TABLET | Freq: Every day | ORAL | Status: DC
  Administered 2017-11-25 – 2017-11-29 (×5): 40 mg via ORAL
  Filled 2017-11-25 (×5): qty 1

## 2017-11-25 MED ORDER — PRO-STAT SUGAR FREE PO LIQD: 60.0000 mL | Freq: Three times a day (TID) | ORAL | Status: DC

## 2017-11-25 MED ORDER — ROPINIROLE HCL 0.25 MG PO TABS
0.2500 mg | ORAL_TABLET | Freq: Two times a day (BID) | ORAL | Status: DC
  Administered 2017-11-26 – 2017-11-27 (×3): 0.25 mg via ORAL
  Filled 2017-11-25 (×5): qty 1

## 2017-11-25 MED ORDER — ALPRAZOLAM 0.5 MG PO TABS
1.0000 mg | ORAL_TABLET | Freq: Three times a day (TID) | ORAL | Status: DC | PRN
  Administered 2017-11-25 – 2017-11-29 (×10): 1 mg via ORAL
  Filled 2017-11-25 (×11): qty 2

## 2017-11-25 NOTE — Progress Notes (Signed)
Brief Nutrition Note  Consult received for enteral/tube feeding initiation and management.  Adult Enteral Nutrition Protocol initiated. RD adjusted TF to recommendations provided on 3/8.  [facility] to follow.  Admitting Dx: Hypercapnia [R06.89] Acute pulmonary edema (HCC) [J81.0] Acute respiratory failure with hypercapnia (HCC) [J96.02] Endotracheally intubated [Z97.8] Community acquired pneumonia of right lower lobe of lung (HCC) [J18.1] Respiratory failure (HCC) [J96.90]  Body mass index is 31.25 kg/m. Pt meets criteria for obesity based on current BMI.  Labs:  Recent Labs  Lab 11/23/17 0244 11/24/17 0257 11/24/17 1504 11/25/17 0747  NA 145 143  --  143  K 4.8 3.7  --  4.1  CL 103 103  --  103  CO2 23 27  --  28  BUN 23* 34*  --  43*  CREATININE 1.75* 1.73*  --  1.48*  CALCIUM 9.0 9.0  --  8.9  MG 2.2 2.4 2.5* 2.7*  PHOS 2.7 3.2 4.3 4.8*  GLUCOSE 144* 147*  --  167*    Tilda FrancoLindsey Kathleena Freeman, MS, RD, LDN [facility] Clinical Dietitian Pager: [phone] After Hours Pager: [phone]

## 2017-11-25 NOTE — Progress Notes (Signed)
Patient stated he wore CPAP at night at home. Placed patient on CPAP for the night via auto-mode with oxygen set at 5lpm. Sp02=94%

## 2017-11-25 NOTE — Progress Notes (Signed)
PULMONARY / CRITICAL CARE MEDICINE   Name: Dustin [name] MRN: [id number] DOB: 1955/10/06    ADMISSION DATE:  11/22/2017 CONSULTATION DATE:  3/7  REFERRING MD:  Jeani HawkingAnnie [facility]   CHIEF COMPLAINT: Respiratory failure  HISTORY OF PRESENT ILLNESS:   62 year old male with history of COPD with ongoing tobacco abuse, chronic diastolic CHF, hypertension, recent pneumonia treated as outpt presented 3/7 to [facility] in profound respiratory distress.  Per EMS patient had prodromal URI symptoms for several days prior to admission.  He had significant respiratory distress treated with BiPAP in route, intubated quickly on arrival to ER.  Chest x-ray suspicious for pneumonia with overlying pulmonary edema.  PCCM was consulted for transfer to [facility] for ongoing ICU care and ventilator management.    Most recent hospitalization 09/2017 for AECOPD.   SUBJECTIVE:   No acute change.  Tol PS 5/5 but remains on low dose propofol.    VITAL SIGNS: BP (!) 113/58   Pulse 93   Temp 97.6 F (36.4 C) (Oral)   Resp (!) 21   Ht 5\' 10"  (1.778 m)   Wt 98.8 kg (217 lb 13 oz)   SpO2 91%   BMI 31.25 kg/m   VENTILATOR SETTINGS: Vent Mode: PSV;CPAP FiO2 (%):  [40 %-50 %] 40 % Set Rate:  [24 bmp] 24 bmp Vt Set:  [600 mL] 600 mL PEEP:  [5 cmH20] 5 cmH20 Pressure Support:  [5 cmH20] 5 cmH20 Plateau Pressure:  [14 cmH20-17 cmH20] 17 cmH20  INTAKE / OUTPUT: I/O last 3 completed shifts: In: 2543.2 [I.V.:1213.9; NG/GT:679.3; IV Piggyback:650] Out: 1570 [Urine:1570]  PHYSICAL EXAMINATION: General: adult male in NAD lying in bed on vent HEENT: MM pink/moist, ETT Neuro: sedated, RASS -1, follows some commands, MAE  CV: s1s2 rrr, no m/r/g PULM: even/non-labored, lungs bilaterally diminished KG:MWNUGI:soft, non-tender, bsx4 active  Extremities: warm/dry, 1+ BLE pitting edema  Skin: no rashes or lesions  LABS:  BMET Recent Labs  Lab 11/23/17 0244 11/24/17 0257 11/25/17 0747  NA 145 143 143  K 4.8 3.7 4.1   CL 103 103 103  CO2 23 27 28   BUN 23* 34* 43*  CREATININE 1.75* 1.73* 1.48*  GLUCOSE 144* 147* 167*    Electrolytes Recent Labs  Lab 11/23/17 0244 11/24/17 0257 11/24/17 1504 11/25/17 0747  CALCIUM 9.0 9.0  --  8.9  MG 2.2 2.4 2.5* 2.7*  PHOS 2.7 3.2 4.3 4.8*    CBC Recent Labs  Lab 11/23/17 0244 11/24/17 0257 11/25/17 0747  WBC 7.9 14.5* 11.4*  HGB 11.1* 10.3* 10.9*  HCT 37.2* 31.8* 35.5*  PLT 208 203 228    Coag's No results for input(s): APTT, INR in the last 168 hours.  Sepsis Markers Recent Labs  Lab 11/22/17 2000 11/23/17 0244 11/24/17 0257  PROCALCITON 0.94 0.95 0.69    ABG Recent Labs  Lab 11/22/17 2218 11/23/17 0445 11/24/17 0424  PHART 7.370 7.329* 7.438  PCO2ART 56.9* 64.2* 46.4  PO2ART 75.0* 82.3* 70.7*    Liver Enzymes Recent Labs  Lab 11/22/17 1048  AST 23  ALT 29  ALKPHOS 103  BILITOT 0.9  ALBUMIN 3.4*    Cardiac Enzymes Recent Labs  Lab 11/23/17 0244 11/23/17 0723 11/23/17 1413  TROPONINI 0.23*  0.12* 0.08* 0.07*    Glucose Recent Labs  Lab 11/24/17 1204 11/24/17 1643 11/24/17 2001 11/24/17 2328 11/25/17 0408 11/25/17 0759  GLUCAP 137* 136* 141* 130* 146* 148*    Imaging Dg Chest Port 1 View  Result Date: 11/25/2017  CLINICAL DATA:  Acute respiratory failure, hypoxia EXAM: PORTABLE CHEST 1 VIEW COMPARISON:  11/24/2017 FINDINGS: Endotracheal tube and NG tube are unchanged. Cardiomegaly. There is hyperinflation of the lungs compatible with COPD. Bilateral lower lobe airspace opacities are again noted, unchanged. Possible small left effusion. No acute bony abnormality. IMPRESSION: COPD, cardiomegaly. Stable bibasilar airspace opacities. Suspect small left effusion. Electronically Signed   By: Charlett Nose M.D.   On: 11/25/2017 07:13    STUDIES:  CXR 3/7 >> CM with pulmonary edema.  CULTURES: BC x 2 3/7 >> neg  UC 3/7 >> negative  Sputum 3/7 >> rare staph>>> RVP 3/7 >> rhinovirus positive    ANTIBIOTICS: Rocephin 3/7 >> 3/7 Azithro 3/7 >> 3/7 Vanc 3/7 >> 3/9 Cefepime 3/7 >>  SIGNIFICANT EVENTS: 3/07  Tx from [facility] ER, intubated, HCAP +/- pulmonary edema   LINES/TUBES: ETT 3/7 >>  DISCUSSION: 62 year old male with acute on chronic respiratory failure secondary to rhinovirus/ staph HCAP +/- volume overload and probable acute exacerbation of COPD.  Intubated in [facility] ER, transferred to [facility] 3/7.  ASSESSMENT / PLAN:  PULMONARY A: Respiratory insufficiency - failed BiPAP and required intubation. HCAP. Rhinovirus Positive  Acute pulmonary edema - responded well to lasix. Hx asthma, 2L O2 dependent COPD (based off CT scan, no PFT's on file), tobacco abuse. P:   Vent support - 8cc/kg  F/u CXR  F/u ABG PS wean as tol - may be able to extubate later today - hold propofol  Wean PEEP / FiO2 for sats 88-94% Follow CXR  Daily SBT / WUA  Continue Budesonide + Brovana, atrovent and PRN albuterol  Hold home proair, dulera, theophylline, spiriva Smoking cessation counseling when able   CARDIOVASCULAR A:  Troponin leak - suspect demand. Hx HTN, dCHF (Echo from Aug 2017 with EF 60 - 65%, G1DD). P:  ICU monitoring Hold home lasix Had lasix X 1 3/9  RENAL A:   AKI. Volume overload - net - thus far. P:   KVO IVF  Trend BMP / urinary output Replace electrolytes as indicated Avoid nephrotoxic agents, ensure adequate renal perfusion  GASTROINTESTINAL A:   Hx GERD. Nutrition. P:   PPI for SUP  NPO / OGT  Continue TF   HEMATOLOGIC A:   VTE Prophylaxis. P:  SCD's + heparin for DVT  Trend CBC   INFECTIOUS A:   HCAP. Rhinovirus  P:   ABX as above  vanc off  Follow cultures Trend PCT   ENDOCRINE A:   Hyperglycemia. P:   SSI   NEUROLOGIC A:   Sedation needs due to mechanical ventilation. Hx depression, anxiety. P:   Sedation:  propofol / PRN fentanyl  RASS goal: 0 to -1  Daily WUA - hold propofol - may be able to  extubate  Continue home xanax  Hold home escitalopram, gabapentin, ropinirole   Family updated: No family at bedside am 3/10  Interdisciplinary Family Meeting v Palliative Care Meeting:  Due by: 11/28/17.   Dirk Dress, NP 11/25/2017  9:52 AM Pager: [phone] or [phone]

## 2017-11-25 NOTE — Progress Notes (Signed)
Called to room STAT d/t pt ETT partially out.  Deflated cuff to readvance ETT to 24 cm at lip.  + good VT returns, sat 95%, no distress noted.  Pt apparently was trying to itch his eyebrow and mitten got caught on ETT/airway.    RN aware.

## 2017-11-25 NOTE — Procedures (Signed)
Extubation Procedure Note  Patient Details:   Name: Dustin [name] DOB: May 18, 1956 MRN: [id number]   Airway Documentation:   + cuff leak test prior to extubation  Evaluation  O2 sats: stable throughout Complications: No apparent complications Patient did tolerate procedure well. Bilateral Breath Sounds: Rhonchi   Yes, pt able to speak. No stridor noted, no distress noted.  VSS, sat 93% on 6 lpm [place].  RN in room and aware.  [name], [name] Joy 11/25/2017, 1:30 PM

## 2017-11-26 DIAGNOSIS — J189 Pneumonia, unspecified organism: Secondary | ICD-10-CM

## 2017-11-26 DIAGNOSIS — J441 Chronic obstructive pulmonary disease with (acute) exacerbation: Secondary | ICD-10-CM

## 2017-11-26 DIAGNOSIS — R0902 Hypoxemia: Secondary | ICD-10-CM

## 2017-11-26 LAB — BASIC METABOLIC PANEL
ANION GAP: 9 (ref 5–15)
BUN: 38 mg/dL — ABNORMAL HIGH (ref 6–20)
CO2: 28 mmol/L (ref 22–32)
Calcium: 8.6 mg/dL — ABNORMAL LOW (ref 8.9–10.3)
Chloride: 102 mmol/L (ref 101–111)
Creatinine, Ser: 1.12 mg/dL (ref 0.61–1.24)
GFR calc Af Amer: >60 mL/min (ref >60)
GLUCOSE: 118 mg/dL — AB (ref 65–99)
POTASSIUM: 4.4 mmol/L (ref 3.5–5.1)
Sodium: 139 mmol/L (ref 135–145)

## 2017-11-26 LAB — CBC
HEMATOCRIT: 35.4 % — AB (ref 39.0–52.0)
Hemoglobin: 11 g/dL — ABNORMAL LOW (ref 13.0–17.0)
MCH: 31.3 pg (ref 26.0–34.0)
MCHC: 31.1 g/dL (ref 30.0–36.0)
MCV: 100.6 fL — ABNORMAL HIGH (ref 78.0–100.0)
Platelets: 231 10*3/uL (ref 150–400)
RBC: 3.52 MIL/uL — ABNORMAL LOW (ref 4.22–5.81)
RDW: 13.1 % (ref 11.5–15.5)
WBC: 11.8 10*3/uL — AB (ref 4.0–10.5)

## 2017-11-26 MED ORDER — FUROSEMIDE 10 MG/ML IJ SOLN
40.0000 mg | Freq: Once | INTRAMUSCULAR | Status: AC
  Administered 2017-11-26: 40 mg via INTRAVENOUS
  Filled 2017-11-26: qty 4

## 2017-11-26 MED ORDER — CEFAZOLIN SODIUM-DEXTROSE 2-4 GM/100ML-% IV SOLN
2.0000 g | Freq: Three times a day (TID) | INTRAVENOUS | Status: AC
  Administered 2017-11-26 – 2017-11-28 (×6): 2 g via INTRAVENOUS
  Filled 2017-11-26 (×7): qty 100

## 2017-11-26 NOTE — Progress Notes (Signed)
PULMONARY / CRITICAL CARE MEDICINE   Name: Hershal CoriaDennis R Hornstein MRN: [id number] DOB: 07/08/1956    ADMISSION DATE:  11/22/2017 CONSULTATION DATE:  3/7  REFERRING MD:  [facility]king[facility]nie [facility]   CHIEF COMPLAINT: Respiratory failure  HISTORY OF PRESENT ILLNESS:   62 year old male with history of COPD with ongoing tobacco abuse, chronic diastolic CHF, hypertension, recent pneumonia treated as outpt [facility]7 [facility] in profound respiratory distress.  Per EMS patient had prodromal URI symptoms for several days prior to admission.  He had significant respiratory distress treated with BiPAP in route, intubated quickly on arrival to ER.  Chest x-ray suspicious for pneumonia with overlying pulmonary edema.  PCCM was consulted for transfer to [facility] for ongoing ICU care and ventilator management.    Most recent hospitalization 09/2017 for AECOPD.   SUBJECTIVE:   No events overnight, extubated 3/10 and tolerating it well Reports increased cough and sputum production that he is able to bring up.  VITAL SIGNS: BP 103/61   Pulse 88   Temp 97.9 F (36.6 C) (Axillary)   Resp (!) 25   Ht 5\' 10"  (1.778 m)   Wt 98.3 kg (216 lb 11.4 oz)   SpO2 95%   BMI 31.09 kg/m   VENTILATOR SETTINGS: Vent Mode: PSV;CPAP FiO2 (%):  [40 %] 40 % PEEP:  [5 cmH20] 5 cmH20 Pressure Support:  [5 cmH20] 5 cmH20  INTAKE / OUTPUT: I/O last 3 completed shifts: In: 1702.4 [I.V.:602.4; NG/GT:600; IV Piggyback:500] Out: 1745 [Urine:1745]  PHYSICAL EXAMINATION: General: Adult male, laying in bed in NAD HEENT: [place]/AT, PERRL, EOM-I and MMM Neuro: Alert and interactive, moving all ext to command CV: RRR, Nl S1/S2 and -M/R/G. PULM: Bibasilar decreased in BS GI: Soft, NT, ND and +BS Extremities: warm/dry, 1+ BLE pitting edema  Skin: no rashes or lesions  LABS:  BMET Recent Labs  Lab 11/24/17 0257 11/25/17 0747 11/26/17 0312  NA 143 143 139  K 3.7 4.1 4.4  CL 103 103 102  CO2 27 28 28   BUN 34* 43* 38*   CREATININE 1.73* 1.48* 1.12  GLUCOSE 147* 167* 118*   Electrolytes Recent Labs  Lab 11/24/17 0257 11/24/17 1504 11/25/17 0747 11/26/17 0312  CALCIUM 9.0  --  8.9 8.6*  MG 2.4 2.5* 2.7*  --   PHOS 3.2 4.3 4.8*  --    CBC Recent Labs  Lab 11/24/17 0257 11/25/17 0747 11/26/17 0312  WBC 14.5* 11.4* 11.8*  HGB 10.3* 10.9* 11.0*  HCT 31.8* 35.5* 35.4*  PLT 203 228 231   Coag's No results for input(s): APTT, INR in the last 168 hours.  Sepsis Markers Recent Labs  Lab 11/22/17 2000 11/23/17 0244 11/24/17 0257  PROCALCITON 0.94 0.95 0.69   ABG Recent Labs  Lab 11/22/17 2218 11/23/17 0445 11/24/17 0424  PHART 7.370 7.329* 7.438  PCO2ART 56.9* 64.2* 46.4  PO2ART 75.0* 82.3* 70.7*   Liver Enzymes Recent Labs  Lab 11/22/17 1048  AST 23  ALT 29  ALKPHOS 103  BILITOT 0.9  ALBUMIN 3.4*   Cardiac Enzymes Recent Labs  Lab 11/23/17 0244 11/23/17 0723 11/23/17 1413  TROPONINI 0.23*  0.12* 0.08* 0.07*   Glucose Recent Labs  Lab 11/24/17 2001 11/24/17 2328 11/25/17 0408 11/25/17 0759 11/25/17 1251 11/25/17 1631  GLUCAP 141* 130* 146* 148* 137* 137*   Imaging I reviewed CXR myself, pulmonary edema noted  STUDIES:  CXR 3/7 >> CM with pulmonary edema.  CULTURES: BC x 2 3/7 >> neg  UC 3/7 >>  negative  Sputum 3/7 >> rare staph>>> RVP 3/7 >> rhinovirus positive   ANTIBIOTICS: Rocephin 3/7 >> 3/7 Azithro 3/7 >> 3/7 Vanc 3/7 >> 3/9 Cefepime 3/7 >>  SIGNIFICANT EVENTS: 3/07  Tx from [facility] ER, intubated, HCAP +/- pulmonary edema   LINES/TUBES: ETT 3/7 >>3/10  DISCUSSION: 62 year old male with acute on chronic respiratory failure secondary to rhinovirus/ staph HCAP +/- volume overload and probable acute exacerbation of COPD.  Intubated in [facility] ER, [facility] 3/7.  Discussed with [facility]-MD.  ASSESSMENT / PLAN:  PULMONARY A: Respiratory insufficiency - failed BiPAP and required intubation. HCAP. Rhinovirus Positive  Acute  pulmonary edema - responded well to lasix. Hx asthma, 2L O2 dependent COPD (based off CT scan, no PFT's on file), tobacco abuse. P:   Titrate O2 for sat of 88-92% F/u CXR and ABG to PRN at this point IS per RT protocol Flutter valve Wean PEEP / FiO2 for sats 88-92% Continue Budesonide + Brovana, atrovent and PRN albuterol  Hold home proair, dulera, theophylline, spiriva, will restart when getting closer to discharge Smoking cessation counseling  CARDIOVASCULAR A:  Troponin leak - suspect demand. Hx HTN, dCHF (Echo from Aug 2017 with EF 60 - 65%, G1DD). P:  Tele monitoring Lasix 40 mg IV x1 now  RENAL A:   AKI. Volume overload - net - thus far. P:   KVO IVF Trend BMP / urinary output Replace electrolytes as indicated Avoid nephrotoxic agents, ensure adequate renal perfusion  GASTROINTESTINAL A:   Hx GERD. Nutrition. P:   PPI for SUP  Heart healthy diabetic diet as ordered  HEMATOLOGIC A:   VTE Prophylaxis. P:  SCD's + heparin for DVT  Trend CBC   INFECTIOUS A:   HCAP. Rhinovirus  P:   ABX as above - cefepime vanc off 3/10 Follow cultures  ENDOCRINE A:   Hyperglycemia. P:   SSI   NEUROLOGIC A:   Sedation needs due to mechanical ventilation. Hx depression, anxiety. P:   D/C all sedation Continue home xanax  Hold home escitalopram, gabapentin, ropinirole until getting closer to discharge  Family updated: Patient updated bedside 3/11.  Transfer to tele and to [facility] service with PCCM off 3/12.  Interdisciplinary Family Meeting v Palliative Care Meeting:  Due by: 11/28/17.  Alyson Reedy, M.D. [facility] Pulmonary/Critical Care Medicine. Pager: [phone]. After hours pager: [phone].

## 2017-11-26 NOTE — Evaluation (Signed)
Physical Therapy Evaluation Patient Details Name: Dustin [name] MRN: [id number] DOB: Jul 20, 1956 Today's Date: 11/26/2017   History of Present Illness  Pt adm with respiratory failure and intubated 3/7. Extubated 3/10. PMH -  COPD, chf, htn, PNA  Clinical Impression  Pt admitted with above diagnosis and presents to PT with functional limitations due to deficits listed below (See PT problem list). Pt needs skilled PT to maximize independence and safety to allow discharge to ST-SNF. Pt debilitated and needs further therapy before return to son's home. Pt with SpO2 90% on 4L with activity.     Follow Up Recommendations SNF    Equipment Recommendations  None recommended by PT    Recommendations for Other Services       Precautions / Restrictions Precautions Precautions: Fall Restrictions Weight Bearing Restrictions: No      Mobility  Bed Mobility Overal bed mobility: Needs Assistance Bed Mobility: Supine to Sit     Supine to sit: Min assist     General bed mobility comments: Assist to bring legs off of bed and elevate trunk into sitting  Transfers Overall transfer level: Needs assistance Equipment used: 1 person hand held assist Transfers: Sit to/from [employer] Sit to Stand: Min assist Stand pivot transfers: Min assist       General transfer comment: Assist to bring hips up. Assist for balance and support for pivot to chair.  Ambulation/Gait Ambulation/Gait assistance: Min assist Ambulation Distance (Feet): 2 Feet Assistive device: 1 person hand held assist Gait Pattern/deviations: Step-through pattern;Decreased step length - right;Decreased step length - left Gait velocity: decr Gait velocity interpretation: Below normal speed for age/gender General Gait Details: Assist for balance and support for pivotal steps to chair.  Stairs            Wheelchair Mobility    Modified Rankin (Stroke Patients Only)       Balance Overall  balance assessment: Needs assistance Sitting-balance support: No upper extremity supported;Feet supported Sitting balance-Leahy Scale: Fair     Standing balance support: Single extremity supported Standing balance-Leahy Scale: Poor Standing balance comment: Min assist for static standing                             Pertinent Vitals/Pain Pain Assessment: No/denies pain    Home Living Family/patient expects to be discharged to:: Private residence Living Arrangements: Children Available Help at Discharge: Family Type of Home: House Home Access: Stairs to enter Entrance Stairs-Rails: Doctor, general practice of Steps: 3  Home Layout: One level Home Equipment: Environmental consultant - 2 wheels;Shower seat;Grab bars - tub/shower;Walker - 4 wheels Additional Comments: Pt stays in the living room at his son's house.     Prior Function Level of Independence: Independent with assistive device(s)         Comments: Uses walker at times and home O2 at 2L. Limited community distances     Hand Dominance   Dominant Hand: Right    Extremity/Trunk Assessment   Upper Extremity Assessment Upper Extremity Assessment: Generalized weakness    Lower Extremity Assessment Lower Extremity Assessment: Generalized weakness       Communication   Communication: No difficulties  Cognition Arousal/Alertness: Awake/alert Behavior During Therapy: WFL for tasks assessed/performed Overall Cognitive Status: Within Functional Limits for tasks assessed  General Comments      Exercises     Assessment/Plan    PT Assessment Patient needs continued PT services  PT Problem List Decreased strength;Decreased activity tolerance;Decreased balance;Decreased mobility;Cardiopulmonary status limiting activity       PT Treatment Interventions DME instruction;Gait training;Functional mobility training;Therapeutic activities;Therapeutic  exercise;Balance training;Patient/family education    PT Goals (Current goals can be found in the Care Plan section)  Acute Rehab PT Goals Patient Stated Goal: Get strong enought to go home PT Goal Formulation: With patient Time For Goal Achievement: 12/10/17 Potential to Achieve Goals: Good    Frequency Min 2X/week   Barriers to discharge Decreased caregiver support son works    Co-evaluation               AM-PAC PT "6 Clicks" Daily Activity  Outcome Measure Difficulty turning over in bed (including adjusting bedclothes, sheets and blankets)?: Unable Difficulty moving from lying on back to sitting on the side of the bed? : Unable Difficulty sitting down on and standing up from a chair with arms (e.g., wheelchair, bedside commode, etc,.)?: Unable Help needed moving to and from a bed to chair (including a wheelchair)?: A Little Help needed walking in hospital room?: A Lot Help needed climbing 3-5 steps with a railing? : Total 6 Click Score: 9    End of Session Equipment Utilized During Treatment: Gait belt;Oxygen Activity Tolerance: Patient limited by fatigue Patient left: in chair;with call bell/phone within reach;with chair alarm set Nurse Communication: Mobility status PT Visit Diagnosis: Unsteadiness on feet (R26.81);Other abnormalities of gait and mobility (R26.89);Muscle weakness (generalized) (M62.81)    Time: [id number] PT Time Calculation (min) (ACUTE ONLY): 25 min   Charges:   PT Evaluation $PT Eval Moderate Complexity: 1 Mod PT Treatments $Therapeutic Activity: 8-22 mins   PT G CodesMarland Kitchen:        [facility] Salima Rumer PT [phone]   Angelina OkCary W [facility] 11/26/2017, 4:52 PM

## 2017-11-26 NOTE — Progress Notes (Signed)
Pharmacy Antibiotic Note  Dustin CoriaDennis R [name] is a 62 y.o. male admitted on 11/22/2017 with profound respiratory distress and concern for pneumonia.  CXR shows a RLL pneumonia. Pharmacy has been consulted for cefepime dosing.  Day #  -WBC= 11.8, tmax= 98.6, PCT= 0.95 -SCr now improved  Cultures growing MSSA - spoke to Fredoniaacoub, will complete total of 7 days of treatment with cefazolin.  Plan: D/c cefepime Start cefazolin 2g IV q 8 hrs x 2 more days.   Height: 5\' 10"  (177.8 cm) Weight: 216 lb 11.4 oz (98.3 kg) IBW/kg (Calculated) : 73  Temp (24hrs), Avg:98 F (36.7 C), Min:97.7 F (36.5 C), Max:98.5 F (36.9 C)  Recent Labs  Lab 11/22/17 1048 11/23/17 0244 11/24/17 0257 11/25/17 0747 11/26/17 0312  WBC 9.8 7.9 14.5* 11.4* 11.8*  CREATININE 1.36* 1.75* 1.73* 1.48* 1.12    Estimated Creatinine Clearance: 79.3 mL/min (by C-G formula based on SCr of 1.12 mg/dL).    Allergies  Allergen Reactions  . Asa [Aspirin] Other (See Comments)    Reaction: swelling of the right side.  . Bee Venom Swelling  . Codeine Other (See Comments)    Reaction: Difficulty breathing  . Ibuprofen Other (See Comments)    Reaction: Swelling of the right side.  . Iodinated Diagnostic Agents Other (See Comments)    Reaction:  Unknown  Other reaction(s): Unknown    Antimicrobials this admission: Azithromycin 3/7 x1 Ceftriaxone 3/7 x1 Cefepime 3/7 >> Vancomycin 3/7 >> 3/9    Dose adjustments this admission:   Microbiology results: 3/7 Resp cx >> MSSA 3/7 resp- norm flora 3/7 RVP >> rhinovirus 3/7 Blood >> ngtd 3/7 Urine >> neg 3/7 MRSA PCR  Thank you for allowing pharmacy to be a part of this patient's care.  Tad MooreJessica Loman Logan, Pharm D, BCPS  Clinical Pharmacist Pager [phone]  11/26/2017 1:12 PM

## 2017-11-27 DIAGNOSIS — J810 Acute pulmonary edema: Secondary | ICD-10-CM

## 2017-11-27 DIAGNOSIS — J181 Lobar pneumonia, unspecified organism: Secondary | ICD-10-CM

## 2017-11-27 DIAGNOSIS — J9602 Acute respiratory failure with hypercapnia: Secondary | ICD-10-CM

## 2017-11-27 LAB — BASIC METABOLIC PANEL
Anion gap: 10 (ref 5–15)
BUN: 40 mg/dL — AB (ref 6–20)
CALCIUM: 8.7 mg/dL — AB (ref 8.9–10.3)
CO2: 27 mmol/L (ref 22–32)
CREATININE: 1.23 mg/dL (ref 0.61–1.24)
Chloride: 101 mmol/L (ref 101–111)
GFR calc Af Amer: >60 mL/min (ref >60)
GLUCOSE: 128 mg/dL — AB (ref 65–99)
Potassium: 4.6 mmol/L (ref 3.5–5.1)
Sodium: 138 mmol/L (ref 135–145)

## 2017-11-27 LAB — CULTURE, BLOOD (ROUTINE X 2)
CULTURE: NO GROWTH
Culture: NO GROWTH
Culture: NO GROWTH
Culture: NO GROWTH
SPECIAL REQUESTS: ADEQUATE
SPECIAL REQUESTS: ADEQUATE
Special Requests: ADEQUATE

## 2017-11-27 LAB — CBC
HEMATOCRIT: 37.1 % — AB (ref 39.0–52.0)
Hemoglobin: 12.1 g/dL — ABNORMAL LOW (ref 13.0–17.0)
MCH: 32 pg (ref 26.0–34.0)
MCHC: 32.6 g/dL (ref 30.0–36.0)
MCV: 98.1 fL (ref 78.0–100.0)
PLATELETS: 246 10*3/uL (ref 150–400)
RBC: 3.78 MIL/uL — ABNORMAL LOW (ref 4.22–5.81)
RDW: 13.1 % (ref 11.5–15.5)
WBC: 13.2 10*3/uL — ABNORMAL HIGH (ref 4.0–10.5)

## 2017-11-27 LAB — MAGNESIUM: Magnesium: 2.6 mg/dL — ABNORMAL HIGH (ref 1.7–2.4)

## 2017-11-27 LAB — PHOSPHORUS: Phosphorus: 3.9 mg/dL (ref 2.5–4.6)

## 2017-11-27 LAB — MRSA PCR SCREENING: MRSA BY PCR: NEGATIVE

## 2017-11-27 MED ORDER — METHYLPREDNISOLONE SODIUM SUCC 40 MG IJ SOLR
40.0000 mg | Freq: Two times a day (BID) | INTRAMUSCULAR | Status: DC
  Administered 2017-11-27 – 2017-11-28 (×2): 40 mg via INTRAVENOUS
  Filled 2017-11-27 (×2): qty 1

## 2017-11-27 MED ORDER — GABAPENTIN 300 MG PO CAPS
300.0000 mg | ORAL_CAPSULE | Freq: Every day | ORAL | Status: DC
  Administered 2017-11-27 – 2017-11-28 (×2): 300 mg via ORAL
  Filled 2017-11-27 (×2): qty 1

## 2017-11-27 MED ORDER — ROPINIROLE HCL 0.25 MG PO TABS
0.2500 mg | ORAL_TABLET | Freq: Every day | ORAL | Status: DC
  Administered 2017-11-28: 0.25 mg via ORAL
  Filled 2017-11-27: qty 1

## 2017-11-27 MED ORDER — FUROSEMIDE 10 MG/ML IJ SOLN
40.0000 mg | Freq: Two times a day (BID) | INTRAMUSCULAR | Status: AC
  Administered 2017-11-27 (×2): 40 mg via INTRAVENOUS
  Filled 2017-11-27 (×2): qty 4
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
## 2017-11-27 NOTE — Progress Notes (Signed)
PROGRESS NOTE    Dustin [name]  ZOX:[id number] DOB: Feb 04, 1956 DOA: 11/22/2017 PCP: Lyndon Code, MD  Brief Narrative: 62 year old male with history of COPD with ongoing tobacco abuse, chronic diastolic CHF, hypertension, recent pneumonia treated as outpt presented 3/7 to [facility] ER in profound respiratory distress. Admitted to ICU with acute hypoxic reps failure/COPD/PNA requiring mechanical ventilation, +Rhinovirus, also noted to have FLuid overload requiring diuresis Transferred [facility] P[facility] to [facility] today 3/12   Assessment & Plan:     Acute Hypoxic Respiratory failure (HCC) -due to COPD exacerbation/PNA, + RHinovirus -improving, s/p Mechanical ventilation -extubated 3/10 -wean O2 -cut down steroids, Trach aspirate with rare staph aureus-MSSA/doubt its clinical significance, on Ancef now, Day 6of Abx, stop after tomorrows dose  Acute on chronic diastolic CHF -preserved EF on ECHO 2017 -Iv lasix today, transition to PO lasix in 1-2days  COPD -with exacerbation, as above -on 2L Home O2 at baseline -needs nebs/ICS LABA and spiriva at DC  Anxiety -stable, continue xanax PRN  Neuropathy/RLS -resume gabapentin at lower dose  OSA on CPAP -continue CPAP QHS  DVT prophylaxis: HepSQ Code Status: Full Code Family Communication: None at bedside Disposition Plan: Tx to Tele from ICU, SNF at discharge in 48hours likley  Consultants:  PCCM Tx   Procedures:   Antimicrobials:    Subjective: -breathing improving, came off CPAP this am, eating some  Objective: Vitals:   11/27/17 0751 11/27/17 0800 11/27/17 0815 11/27/17 0900  BP:  129/88  133/82  Pulse:  61 69 65  Resp:  16 20 16   Temp: 97.9 F (36.6 C)     TempSrc: Axillary     SpO2:  91% 91% 92%  Weight:      Height:        Intake/Output Summary (Last 24 hours) at 11/27/2017 0940 Last data filed at 11/27/2017 0900 Gross per 24 hour  Intake 308.83 ml  Output 3260 ml  Net -2951.17 ml   Filed Weights   11/25/17 0351 11/26/17 0437 11/27/17 0455  Weight: 98.8 kg (217 lb 13 oz) 98.3 kg (216 lb 11.4 oz) 86.1 kg (189 lb 13.1 oz)    Examination:  General exam: Appears calm and comfortable, obese, no distress Respiratory system: poor air movement, rare whezes, decreased SB at bases Cardiovascular system: S1 & S2 heard, RRR.  Gastrointestinal system: Abdomen is nondistended, soft and nontender.Normal bowel sounds heard. Central nervous system: Alert and oriented. No focal neurological deficits. Extremities:  1plus edema Skin: No rashes, lesions or ulcers Psychiatry: Judgement and insight appear normal. Mood & affect appropriate.     Data Reviewed:   CBC: Recent Labs  Lab 11/22/17 1048 11/23/17 0244 11/24/17 0257 11/25/17 0747 11/26/17 0312 11/27/17 0300  WBC 9.8 7.9 14.5* 11.4* 11.8* 13.2*  NEUTROABS 7.6  --   --   --   --   --   HGB 12.5* 11.1* 10.3* 10.9* 11.0* 12.1*  HCT 40.6 37.2* 31.8* 35.5* 35.4* 37.1*  MCV 105.2* 106.6* 101.6* 102.3* 100.6* 98.1  PLT 188 208 203 228 231 246   Basic Metabolic Panel: Recent Labs  Lab 11/23/17 0244 11/24/17 0257 11/24/17 1504 11/25/17 0747 11/26/17 0312 11/27/17 0300  NA 145 143  --  143 139 138  K 4.8 3.7  --  4.1 4.4 4.6  CL 103 103  --  103 102 101  CO2 23 27  --  28 28 27   GLUCOSE 144* 147*  --  167* 118* 128*  BUN 23* 34*  --  43* 38* 40*  CREATININE 1.75* 1.73*  --  1.48* 1.12 1.23  CALCIUM 9.0 9.0  --  8.9 8.6* 8.7*  MG 2.2 2.4 2.5* 2.7*  --  2.6*  PHOS 2.7 3.2 4.3 4.8*  --  3.9   GFR: Estimated Creatinine Clearance: 63.5 mL/min (by C-G formula based on SCr of 1.23 mg/dL). Liver Function Tests: Recent Labs  Lab 11/22/17 1048  AST 23  ALT 29  ALKPHOS 103  BILITOT 0.9  PROT 7.7  ALBUMIN 3.4*   No results for input(s): LIPASE, AMYLASE in the last 168 hours. No results for input(s): AMMONIA in the last 168 hours. Coagulation Profile: No results for input(s): INR, PROTIME in the last 168 hours. Cardiac  Enzymes: Recent Labs  Lab 11/22/17 1048 11/22/17 2000 11/23/17 0244 11/23/17 0723 11/23/17 1413  TROPONINI 0.08* 0.23* 0.23*  0.12* 0.08* 0.07*   BNP (last 3 results) No results for input(s): PROBNP in the last 8760 hours. HbA1C: No results for input(s): HGBA1C in the last 72 hours. CBG: Recent Labs  Lab 11/24/17 2328 11/25/17 0408 11/25/17 0759 11/25/17 1251 11/25/17 1631  GLUCAP 130* 146* 148* 137* 137*   Lipid Profile: No results for input(s): CHOL, HDL, LDLCALC, TRIG, CHOLHDL, LDLDIRECT in the last 72 hours. Thyroid Function Tests: No results for input(s): TSH, T4TOTAL, FREET4, T3FREE, THYROIDAB in the last 72 hours. Anemia Panel: No results for input(s): VITAMINB12, FOLATE, FERRITIN, TIBC, IRON, RETICCTPCT in the last 72 hours. Urine analysis:    Component Value Date/Time   COLORURINE STRAW (A) 08/06/2017 1830   APPEARANCEUR CLEAR (A) 08/06/2017 1830   APPEARANCEUR Hazy 07/23/2014 2110   LABSPEC 1.005 08/06/2017 1830   LABSPEC 1.027 07/23/2014 2110   PHURINE 7.0 08/06/2017 1830   GLUCOSEU NEGATIVE 08/06/2017 1830   GLUCOSEU Negative 07/23/2014 2110   HGBUR NEGATIVE 08/06/2017 1830   BILIRUBINUR NEGATIVE 08/06/2017 1830   BILIRUBINUR Negative 07/23/2014 2110   KETONESUR NEGATIVE 08/06/2017 1830   PROTEINUR NEGATIVE 08/06/2017 1830   NITRITE NEGATIVE 08/06/2017 1830   LEUKOCYTESUR NEGATIVE 08/06/2017 1830   LEUKOCYTESUR Negative 07/23/2014 2110   Sepsis Labs: @LABRCNTIP (procalcitonin:4,lacticidven:4)  ) Recent Results (from the past 240 hour(s))  Blood culture (routine x 2)     Status: None   Collection Time: 11/22/17 10:47 AM  Result Value Ref Range Status   Specimen Description BLOOD LEFT ARM DRAWN BY RN  Final   Special Requests   Final    BOTTLES DRAWN AEROBIC AND ANAEROBIC Blood Culture adequate volume   Culture   Final    NO GROWTH 5 DAYS Performed at [facility], [address], [place], [place] [address]    Report Status 11/27/2017 FINAL   Final  Blood culture (routine x 2)     Status: None   Collection Time: 11/22/17 10:47 AM  Result Value Ref Range Status   Specimen Description BLOOD RIGHT HAND  Final   Special Requests   Final    BOTTLES DRAWN AEROBIC ONLY Blood Culture results may not be optimal due to an inadequate volume of blood received in culture bottles   Culture   Final    NO GROWTH 5 DAYS Performed at [facility], [address] [place], [place] [address]    Report Status 11/27/2017 FINAL  Final  Culture, respiratory (NON-Expectorated)     Status: None   Collection Time: 11/22/17 12:48 PM  Result Value Ref Range Status   Specimen Description   Final    TRACHEAL ASPIRATE Performed at [facility],  [address] [place], [place] [address]    Special Requests   Final    NONE Performed at [facility], [address] [place], [place] [address]    Gram Stain   Final    ABUNDANT WBC PRESENT, PREDOMINANTLY PMN MODERATE GRAM NEGATIVE RODS Performed at [facility], [address], [place], [place] [address]    Culture RARE STAPHYLOCOCCUS AUREUS  Final   Report Status 11/25/2017 FINAL  Final   Organism ID, Bacteria STAPHYLOCOCCUS AUREUS  Final      Susceptibility   Staphylococcus aureus - MIC*    CIPROFLOXACIN <=0.5 SENSITIVE Sensitive     ERYTHROMYCIN RESISTANT Resistant     GENTAMICIN <=0.5 SENSITIVE Sensitive     OXACILLIN <=0.25 SENSITIVE Sensitive     TETRACYCLINE <=1 SENSITIVE Sensitive     VANCOMYCIN <=0.5 SENSITIVE Sensitive     TRIMETH/SULFA <=10 SENSITIVE Sensitive     CLINDAMYCIN RESISTANT Resistant     RIFAMPIN <=0.5 SENSITIVE Sensitive     Inducible Clindamycin POSITIVE Resistant     * RARE STAPHYLOCOCCUS AUREUS  Respiratory Panel by PCR     Status: Abnormal   Collection Time: 11/22/17  6:00 PM  Result Value Ref Range Status   Adenovirus NOT DETECTED NOT DETECTED Final   Coronavirus 229E NOT DETECTED NOT DETECTED Final   Coronavirus HKU1 NOT DETECTED NOT DETECTED Final    Coronavirus NL63 NOT DETECTED NOT DETECTED Final   Coronavirus OC43 NOT DETECTED NOT DETECTED Final   Metapneumovirus NOT DETECTED NOT DETECTED Final   Rhinovirus / Enterovirus DETECTED (A) NOT DETECTED Final   Influenza A NOT DETECTED NOT DETECTED Final   Influenza B NOT DETECTED NOT DETECTED Final   Parainfluenza Virus 1 NOT DETECTED NOT DETECTED Final   Parainfluenza Virus 2 NOT DETECTED NOT DETECTED Final   Parainfluenza Virus 3 NOT DETECTED NOT DETECTED Final   Parainfluenza Virus 4 NOT DETECTED NOT DETECTED Final   Respiratory Syncytial Virus NOT DETECTED NOT DETECTED Final   Bordetella pertussis NOT DETECTED NOT DETECTED Final   Chlamydophila pneumoniae NOT DETECTED NOT DETECTED Final   Mycoplasma pneumoniae NOT DETECTED NOT DETECTED Final  Urine culture     Status: None   Collection Time: 11/22/17  6:00 PM  Result Value Ref Range Status   Specimen Description URINE, RANDOM  Final   Special Requests NONE  Final   Culture   Final    NO GROWTH Performed at [facility], [address] [place], [place] [address]    Report Status 11/24/2017 FINAL  Final  MRSA PCR Screening     Status: Abnormal   Collection Time: 11/22/17  6:00 PM  Result Value Ref Range Status   MRSA by PCR INVALID RESULTS, SPECIMEN SENT FOR CULTURE (A) NEGATIVE Final    Comment: W COUNCIL RN 11/23/17 0110 JDW Performed at [facility], [address] [place], [place] [address]   MRSA culture     Status: None   Collection Time: 11/22/17  6:00 PM  Result Value Ref Range Status   Specimen Description NASOPHARYNGEAL  Final   Special Requests NONE  Final   Culture   Final    NO MRSA DETECTED Performed at [name], [address], [place], [place] [address]    Report Status 11/24/2017 FINAL  Final  Culture, respiratory (NON-Expectorated)     Status: None   Collection Time: 11/22/17  6:03 PM  Result Value Ref Range Status   Specimen Description  TRACHEAL ASPIRATE  Final   Special  Requests NONE  Final   Gram Stain   Final    ABUNDANT WBC PRESENT, PREDOMINANTLY PMN FEW GRAM NEGATIVE RODS RARE GRAM POSITIVE COCCI    Culture   Final    RARE Consistent with normal respiratory flora. Performed at [facility], [address] [place], [name] [address]    Report Status 11/25/2017 FINAL  Final  Culture, blood (routine x 2)     Status: None (Preliminary result)   Collection Time: 11/22/17  7:30 PM  Result Value Ref Range Status   Specimen Description BLOOD LEFT HAND  Final   Special Requests IN PEDIATRIC BOTTLE Blood Culture adequate volume  Final   Culture   Final    NO GROWTH 4 DAYS Performed at [facility], [address] [place], [name] [address]    Report Status PENDING  Incomplete  Culture, blood (routine x 2)     Status: None (Preliminary result)   Collection Time: 11/22/17  7:40 PM  Result Value Ref Range Status   Specimen Description BLOOD RIGHT ARM  Final   Special Requests IN PEDIATRIC BOTTLE Blood Culture adequate volume  Final   Culture   Final    NO GROWTH 4 DAYS Performed at [facility], [address] [place], [name] [address]    Report Status PENDING  Incomplete         Radiology Studies: No results found.      Scheduled Meds: . arformoterol  15 mcg Nebulization BID  . budesonide (PULMICORT) nebulizer solution  0.5 mg Nebulization BID  . furosemide  40 mg Intravenous Q12H  . heparin  5,000 Units Subcutaneous Q8H  . ipratropium-albuterol  3 mL Nebulization Q6H  . methylPREDNISolone (SOLU-MEDROL) injection  40 mg Intravenous Q12H  . pantoprazole  40 mg Oral Q1200  . rOPINIRole  0.25 mg Oral BID   Continuous Infusions: . sodium chloride 10 mL/hr at 11/27/17 0700  .  ceFAZolin (ANCEF) IV Stopped (11/27/17 0525)     LOS: 5 days    Time spent: 35min    Zannie CovePreetha Adante [place], MD [facility] Page via [email or web address], password [email or web address] After 7PM please contact night-coverage  11/27/2017, 9:40 AM

## 2017-11-27 NOTE — Progress Notes (Signed)
RT went to place pt on CPAP. Pt stated he is not ready for CPAP instructed to have RN call RT when ready.

## 2017-11-27 NOTE — Progress Notes (Signed)
Nutrition Follow-up  DOCUMENTATION CODES:   Obesity unspecified  INTERVENTION:   -If po intake inadequate on follow-up, recommend addition of nutritional supplement   NUTRITION DIAGNOSIS:   Inadequate oral intake related to acute illness as evidenced by NPO status.  Improving as diet advanced, good appetite, po 100%  GOAL:   Provide needs based on ASPEN/SCCM guidelines  Progressing  MONITOR:   TF tolerance, Vent status, Labs, Weight trends  REASON FOR ASSESSMENT:   Ventilator    ASSESSMENT:   62 yo male [facility] for respiratory failure requiring intubation, HCAP, acute pulmonary edema with hx of emphysema.  Pt with hx of COPD with ongoing tobacco abuse, chronic CHF, HTN  3/10 Extubated  Recorded po intake 100% at breakfast this AM. Pt reports appetite is good. Pt indicates good appetite prior to admission as well with no major changes in weight  Labs: reviewed Meds: lasix, solumedrol   Diet Order:  Diet heart healthy/carb modified Room service appropriate? Yes; Fluid consistency: Thin  EDUCATION NEEDS:   Not appropriate for education at this time  Skin:  Skin Assessment: Reviewed RN Assessment  Last BM:  3/7  Height:   Ht Readings from Last 1 Encounters:  11/22/17 5\' 10"  (1.778 m)    Weight:   Wt Readings from Last 1 Encounters:  11/27/17 189 lb 13.1 oz (86.1 kg)    Ideal Body Weight:  75.5 kg  BMI:  Body mass index is 27.24 kg/m.  Estimated Nutritional Needs:   Kcal:  1900-2200 kcals  Protein:  96-110 g  Fluid:  >/= 2 L  Dustin Starcherate Rudolpho Claxton MS, RD, LDN, CNSC [phone] Pager  [phone] Weekend/On-Call Pager

## 2017-11-27 NOTE — Progress Notes (Signed)
  Pt admitted to the unit. Pt is stable, alert and oriented per baseline. Oriented to room, staff, and call bell. Educated to call for any assistance. Bed in lowest position, call bell within reach- will continue to monitor. 

## 2017-11-27 NOTE — Clinical Social Work Note (Signed)
Clinical Social Work Assessment  Patient Details  Name: Dustin CoriaDennis R [name] MRN: [id number] Date of Birth: 05/22/1956  Date of referral:  11/27/17               Reason for consult:  Facility Placement                Permission sought to share information with:  Facility Industrial/product designerContact Representative Permission granted to share information::     Name::        Agency::  SNF  Relationship::     Contact Information:     Housing/Transportation Living arrangements for the past 2 months:  Single Family Home Source of Information:  Patient, Partner Patient Interpreter Needed:    Criminal Activity/Legal Involvement Pertinent to Current Situation/Hospitalization:  No - Comment as needed Significant Relationships:  Adult Children Lives with:  Adult Children Do you feel safe going back to the place where you live?  No Need for family participation in patient care:  No (Coment)  Care giving concerns:  Pt lives at home with son who is truck Hospital doctordriver and dtr-in- Social workerlaw.  Son gone for long periods of time and dtr-in-law home all day but busy with his 3 grandchildren (1,3,5 yo).  Pt concerned about home environment for rehab and recovery since so hectic with children.   Social Worker assessment / plan:  CSW spoke with pt concerning PT recommendation for SNF.  Pt has been to SNF in the past and had good experience- understands SNF set up and referral process.  Employment status:  Retired Database administratornsurance information:  Managed Medicare PT Recommendations:  Skilled Nursing Facility Information / Referral to community resources:  Skilled Nursing Facility  Patient/Family's Response to care:  Pt agreeable to SNF and hopeful for [facility] [place] where he was before and had a positive experience.  Patient/Family's Understanding of and Emotional Response to Diagnosis, Current Treatment, and Prognosis:  Pt appears to have good understanding of current condition and hopeful for improvement so he can return home with  family.  Emotional Assessment Appearance:  Appears stated age Attitude/Demeanor/Rapport:    Affect (typically observed):  Appropriate, Accepting, Pleasant Orientation:  Oriented to Self, Oriented to Place, Oriented to  Time, Oriented to Situation Alcohol / Substance use:  Not Applicable Psych involvement (Current and /or in the community):  No (Comment)  Discharge Needs  Concerns to be addressed:  Care Coordination Readmission within the last 30 days:  No Current discharge risk:  Physical Impairment Barriers to Discharge:  Continued Medical Work up   Burna SisUris, Estefany Goebel H, [name] 11/27/2017, 12:56 PM

## 2017-11-27 NOTE — Progress Notes (Signed)
Report called to Verlee Monteora, RN on 3E, will transfer pt to 3E02 with all belongings.  Herma ArdMOSELEY, Yarethzi Branan F, RN

## 2017-11-27 NOTE — Plan of Care (Signed)
Vital signs stable, maintains oxygen saturation per goal.

## 2017-11-28 ENCOUNTER — Telehealth: Payer: Self-pay | Admitting: Cardiovascular Disease

## 2017-11-28 LAB — BASIC METABOLIC PANEL
ANION GAP: 11 (ref 5–15)
BUN: 46 mg/dL — ABNORMAL HIGH (ref 6–20)
CHLORIDE: 98 mmol/L — AB (ref 101–111)
CO2: 31 mmol/L (ref 22–32)
CREATININE: 1.37 mg/dL — AB (ref 0.61–1.24)
Calcium: 8.9 mg/dL (ref 8.9–10.3)
GFR calc non Af Amer: 53 mL/min — ABNORMAL LOW (ref >60)
Glucose, Bld: 91 mg/dL (ref 65–99)
POTASSIUM: 3.9 mmol/L (ref 3.5–5.1)
SODIUM: 140 mmol/L (ref 135–145)

## 2017-11-28 LAB — CBC
HCT: 43.9 % (ref 39.0–52.0)
HEMOGLOBIN: 14.5 g/dL (ref 13.0–17.0)
MCH: 32.3 pg (ref 26.0–34.0)
MCHC: 33 g/dL (ref 30.0–36.0)
MCV: 97.8 fL (ref 78.0–100.0)
PLATELETS: 303 10*3/uL (ref 150–400)
RBC: 4.49 MIL/uL (ref 4.22–5.81)
RDW: 12.8 % (ref 11.5–15.5)
WBC: 16.7 10*3/uL — ABNORMAL HIGH (ref 4.0–10.5)

## 2017-11-28 MED ORDER — IPRATROPIUM-ALBUTEROL 0.5-2.5 (3) MG/3ML IN SOLN
3.0000 mL | Freq: Three times a day (TID) | RESPIRATORY_TRACT | Status: DC
  Administered 2017-11-28 – 2017-11-29 (×3): 3 mL via RESPIRATORY_TRACT
  Filled 2017-11-28 (×4): qty 3

## 2017-11-28 MED ORDER — PREDNISONE 50 MG PO TABS
50.0000 mg | ORAL_TABLET | Freq: Every day | ORAL | Status: DC
  Administered 2017-11-29 (×2): 50 mg via ORAL
  Filled 2017-11-28 (×2): qty 1

## 2017-11-28 MED ORDER — FUROSEMIDE 20 MG PO TABS
20.0000 mg | ORAL_TABLET | Freq: Every day | ORAL | Status: DC
  Administered 2017-11-28 – 2017-11-29 (×2): 20 mg via ORAL
  Filled 2017-11-28 (×2): qty 1

## 2017-11-28 MED ORDER — IPRATROPIUM-ALBUTEROL 0.5-2.5 (3) MG/3ML IN SOLN
3.0000 mL | Freq: Four times a day (QID) | RESPIRATORY_TRACT | Status: DC
  Administered 2017-11-28: 3 mL via RESPIRATORY_TRACT
  Filled 2017-11-28: qty 3

## 2017-11-28 NOTE — Telephone Encounter (Signed)
No answer. Left message to call back on sister's number listed.   Called patient. He is currently in the [facility] right [facility]. Recommended [facility] as that is who Dr Mariah MillingGollan usually refers patient's to. He was appreciative.

## 2017-11-28 NOTE — Progress Notes (Signed)
Patient resting comfortably during shift report. Denies complaints, requests to return to sleep.

## 2017-11-28 NOTE — Progress Notes (Signed)
Call returned to pt's sister, per her initial call earlier (password provided) and verbal permission from patient to speak to her, as he states she will become his HCPOA.

## 2017-11-28 NOTE — Plan of Care (Signed)
  Progressing Activity: Risk for activity intolerance will decrease 11/28/2017 1958 - Progressing by Carolanne GrumblingWall, Brynja Marker C, RN Pt. States his energy level is slowly getting better. Elimination: Will not experience complications related to bowel motility 11/28/2017 1958 - Progressing by Carolanne GrumblingWall, Ryka Beighley C, RN Safety: Ability to remain free from injury will improve 11/28/2017 1958 - Progressing by Carolanne GrumblingWall, Robie Mcniel C, RN
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
## 2017-11-28 NOTE — Progress Notes (Signed)
Completed Advanced Directives consisting of HCPOA and Living Will.  Copy has been placed on the physical chart and the original document is also on the chart to be given to the patient at discharge.      11/28/17 1439  Clinical Encounter Type  Visited With Patient;Health care provider (Volunteer witnesses)  Visit Type Follow-up;Spiritual support  Advance Directives (For Healthcare)  Does Patient Have a Medical Advance Directive? Yes  Type of Estate agentAdvance Directive Healthcare Power of CarltonAttorney;Living will

## 2017-11-28 NOTE — Progress Notes (Signed)
Physical Therapy Treatment Patient Details Name: Dustin [name] MRN: [id number] DOB: 03-Jan-1956 Today's Date: 11/28/2017    History of Present Illness Pt adm with respiratory failure and intubated 3/7. Extubated 3/10. PMH -  COPD, chf, htn, PNA    PT Comments    Pt performed gait and functional mobility due to decreased assistance.  Pt remains limited functional due to poor endurance and energy conservation.  Pt at this time will continue to benefit from SNF placement to improve endurance reduce need for assistance when returning home.  Plan next session for gait training with rollator.    Follow Up Recommendations  SNF     Equipment Recommendations  None recommended by PT    Recommendations for Other Services       Precautions / Restrictions Precautions Precautions: Fall Restrictions Weight Bearing Restrictions: No    Mobility  Bed Mobility               General bed mobility comments: Pt in recliner on arrival.    Transfers Overall transfer level: Needs assistance Equipment used: 1 person hand held assist Transfers: Sit to/from Stand Sit to Stand: Min guard         General transfer comment: Cues for hand placement min guard for safety.    Ambulation/Gait Ambulation/Gait assistance: Min assist Ambulation Distance (Feet): 60 Feet Assistive device: Rolling walker (2 wheeled) Gait Pattern/deviations: Step-through pattern;Decreased step length - right;Decreased step length - left;Trunk flexed Gait velocity: decr Gait velocity interpretation: Below normal speed for age/gender General Gait Details: Cues for forward gaze and upper trunk control.  Pt slow and guarded and destaurated on 3L with activity and required 4L to recover above 90 %.     Stairs            Wheelchair Mobility    Modified Rankin (Stroke Patients Only)       Balance Overall balance assessment: Needs assistance   Sitting balance-Leahy Scale: Fair       Standing  balance-Leahy Scale: Poor Standing balance comment: Min assist for static standing                            Cognition Arousal/Alertness: Awake/alert Behavior During Therapy: WFL for tasks assessed/performed Overall Cognitive Status: Within Functional Limits for tasks assessed                                        Exercises      General Comments        Pertinent Vitals/Pain Pain Assessment: No/denies pain    Home Living                      Prior Function            PT Goals (current goals can now be found in the care plan section) Acute Rehab PT Goals Patient Stated Goal: Get strong enought to go home Potential to Achieve Goals: Good Progress towards PT goals: Progressing toward goals    Frequency    Min 2X/week      PT Plan Current plan remains appropriate    Co-evaluation              AM-PAC PT "6 Clicks" Daily Activity  Outcome Measure  Difficulty turning over in bed (including adjusting bedclothes, sheets and blankets)?: Unable Difficulty moving from lying  on back to sitting on the side of the bed? : Unable Difficulty sitting down on and standing up from a chair with arms (e.g., wheelchair, bedside commode, etc,.)?: Unable Help needed moving to and from a bed to chair (including a wheelchair)?: A Little Help needed walking in hospital room?: A Little Help needed climbing 3-5 steps with a railing? : A Little 6 Click Score: 12    End of Session Equipment Utilized During Treatment: Gait belt;Oxygen Activity Tolerance: Patient limited by fatigue Patient left: in chair;with call bell/phone within reach;with chair alarm set Nurse Communication: Mobility status PT Visit Diagnosis: Unsteadiness on feet (R26.81);Other abnormalities of gait and mobility (R26.89);Muscle weakness (generalized) (M62.81)     Time: [id number] PT Time Calculation (min) (ACUTE ONLY): 26 min  Charges:  $Gait Training: 8-22  mins $Therapeutic Activity: 8-22 mins                    G Codes:       Joycelyn Ruaimee Alys Dulak, PTA pager [phone]    Florestine Aversimee J Smera Guyette 11/28/2017, 5:01 PM

## 2017-11-28 NOTE — Telephone Encounter (Signed)
Sister called back .  Appreciates information .  Scheduled with Sung AmabileSimonds

## 2017-11-28 NOTE — Progress Notes (Signed)
Chaplain and witnesses have been at bedside [facility].

## 2017-11-28 NOTE — Care Management Important Message (Signed)
Important Message  Patient Details  Name: Dustin [name] MRN: [id number] Date of Birth: Nov 02, 1955   Medicare Important Message Given:  Yes    Oralia RudMegan P Kaseem Vastine 11/28/2017, 2:13 PM

## 2017-11-28 NOTE — Clinical Social Work Note (Addendum)
CSW met with patient and confirmed that [facility] is first preference SNF. They have extended a bed offer. Patient will have someone bring his home cpap machine to the facility. Per MD, patient will likely discharge tomorrow. CSW left voicemail for admissions coordinator to notify her that bed offer had been accepted and asked her to start insurance authorization if they will have a bed. Patient requesting chaplain consult for [facility] paperwork. RN notified.  Dayton Scrape, [place] [phone]  10:04 am Admissions coordinator confirmed bed availability for tomorrow and will start insurance authorization.  Dayton Scrape, [place]

## 2017-11-28 NOTE — Progress Notes (Signed)
Pt placed on CPAP with 3 lpm bled in oxygen tolerating well no issues to report.

## 2017-11-28 NOTE — Telephone Encounter (Signed)
Patient sister calling to see if Dr. Mariah MillingGollan will recommend a new pulmonologist as patient is not happy with care from sadaat khan   Please call

## 2017-11-28 NOTE — Progress Notes (Addendum)
PROGRESS NOTE    Dustin [name]  WUJ:[id number] DOB: Sep 15, 1956 DOA: 11/22/2017 PCP: Lyndon Code, MD     Brief Narrative:  Dustin [name] is a 62 year old male with history of COPD with ongoing tobacco abuse, chronic diastolic CHF, hypertension, recent pneumonia treated as outpt presented 3/7 to [facility] ER in profound respiratory distress. He was admitted to ICU with acute hypoxic reps failure/COPD/PNA requiring mechanical ventilation, +Rhinovirus and was also noted to have fluid overload requiring diuresis. He was extubated and remained stable, transferred from [facility] to [facility] 3/12.   Assessment & Plan:   Active Problems:   Respiratory failure (HCC)   HCAP (healthcare-associated pneumonia)   Acute on chronic hypoxic respiratory failure -Due to COPD exacerbation/PNA, +Rhinovirus, CHF exacerbation  -Tracheal aspirate with normal respiratory flora, rare staph aureus  -Extubated 3/10 -Total 7 days of IV antibiotics, today will be last dose  -Continue to wean [place] O2, to baseline 2L   Acute on chronic diastolic CHF -EF 95-62%, grade 1 diastolic dysfunction  -UOP 3.3L last 24 hours -Improving -Transitioned to PO lasix 20mg  daily at home dose   COPD exacerbation  -Needs nebs/ICS LABA and spiriva at DC -De-escalate solumedrol to PO prednisone starting AM   Anxiety -Stable, continue xanax PRN  Neuropathy/RLS -Resume gabapentin at lower dose, continue requip   OSA on CPAP -Continue CPAP QHS  Leukocytosis  -Due to steroids, monitor CBC    DVT prophylaxis: subq hep Code Status: full Family Communication: no family at bedside, updated daughter over phone this afternoon  Disposition Plan: pending improvement in respiratory status and wean [place] O2 to baseline level. Possible DC to SNF 3/14   Consultants:   PCCM  Procedures:   None   Antimicrobials:  Anti-infectives (From admission, onward)   Start     Dose/Rate Route Frequency Ordered Stop   11/26/17 2000   ceFAZolin (ANCEF) IVPB 2g/100 mL premix     2 g 200 mL/hr over 30 Minutes Intravenous Every 8 hours 11/26/17 1325 11/28/17 1959   11/23/17 1830  vancomycin (VANCOCIN) IVPB 750 mg/150 ml premix  Status:  Discontinued     750 mg 150 mL/hr over 60 Minutes Intravenous Every 12 hours 11/23/17 0923 11/24/17 1031   11/23/17 0630  vancomycin (VANCOCIN) IVPB 1000 mg/200 mL premix  Status:  Discontinued     1,000 mg 200 mL/hr over 60 Minutes Intravenous Every 12 hours 11/22/17 1816 11/23/17 0923   11/22/17 2000  ceFEPIme (MAXIPIME) 1 g in sodium chloride 0.9 % 100 mL IVPB  Status:  Discontinued     1 g 200 mL/hr over 30 Minutes Intravenous Every 8 hours 11/22/17 1816 11/26/17 1325   11/22/17 1815  vancomycin (VANCOCIN) 1,750 mg in sodium chloride 0.9 % 500 mL IVPB     1,750 mg 250 mL/hr over 120 Minutes Intravenous  Once 11/22/17 1816 11/22/17 2238   11/22/17 1015  azithromycin (ZITHROMAX) 500 mg in sodium chloride 0.9 % 250 mL IVPB  Status:  Discontinued     500 mg 250 mL/hr over 60 Minutes Intravenous Every 24 hours 11/22/17 1013 11/22/17 1747   11/22/17 1015  cefTRIAXone (ROCEPHIN) 1 g in sodium chloride 0.9 % 100 mL IVPB     1 g 200 mL/hr over 30 Minutes Intravenous  Once 11/22/17 1013 11/22/17 1146       Subjective: Doing well, breathing has improved since hospitalization. No complaints of chest pain, nausea, vomiting, abdominal pain.   Objective: Vitals:   11/27/17 2341 11/28/17 0151  11/28/17 0418 11/28/17 1201  BP: 112/62  107/70 117/70  Pulse: 86 88 83 87  Resp: 20 18 18 18   Temp: 98 F (36.7 C)  97.8 F (36.6 C) 97.8 F (36.6 C)  TempSrc: Oral  Oral Oral  SpO2: 92% 90% 92% 99%  Weight:   96.9 kg (213 lb 9.6 oz)   Height:        Intake/Output Summary (Last 24 hours) at 11/28/2017 1218 Last data filed at 11/28/2017 0900 Gross per 24 hour  Intake 755 ml  Output 2400 ml  Net -1645 ml   Filed Weights   11/27/17 0455 11/27/17 2120 11/28/17 0418  Weight: 86.1 kg (189 lb  13.1 oz) 98.3 kg (216 lb 12.8 oz) 96.9 kg (213 lb 9.6 oz)    Examination:  General exam: Appears calm and comfortable  Respiratory system: Decreased breath sounds bilaterally, minimal expiratory wheezes. Respiratory effort normal. On [place] O2 5L without distress  Cardiovascular system: S1 & S2 heard, RRR. No JVD, murmurs, rubs, gallops or clicks. No pedal edema. Gastrointestinal system: Abdomen is nondistended, soft and nontender. No organomegaly or masses felt. Normal bowel sounds heard. Central nervous system: Alert and oriented. No focal neurological deficits. Extremities: Symmetric 5 x 5 power. Skin: No rashes, lesions or ulcers Psychiatry: Judgement and insight appear normal. Mood & affect appropriate.   Data Reviewed: I have personally reviewed following labs and imaging studies  CBC: Recent Labs  Lab 11/22/17 1048  11/24/17 0257 11/25/17 0747 11/26/17 0312 11/27/17 0300 11/28/17 0822  WBC 9.8   < > 14.5* 11.4* 11.8* 13.2* 16.7*  NEUTROABS 7.6  --   --   --   --   --   --   HGB 12.5*   < > 10.3* 10.9* 11.0* 12.1* 14.5  HCT 40.6   < > 31.8* 35.5* 35.4* 37.1* 43.9  MCV 105.2*   < > 101.6* 102.3* 100.6* 98.1 97.8  PLT 188   < > 203 228 231 246 303   < > = values in this interval not displayed.   Basic Metabolic Panel: Recent Labs  Lab 11/23/17 0244 11/24/17 0257 11/24/17 1504 11/25/17 0747 11/26/17 0312 11/27/17 0300 11/28/17 0444  NA 145 143  --  143 139 138 140  K 4.8 3.7  --  4.1 4.4 4.6 3.9  CL 103 103  --  103 102 101 98*  CO2 23 27  --  28 28 27 31   GLUCOSE 144* 147*  --  167* 118* 128* 91  BUN 23* 34*  --  43* 38* 40* 46*  CREATININE 1.75* 1.73*  --  1.48* 1.12 1.23 1.37*  CALCIUM 9.0 9.0  --  8.9 8.6* 8.7* 8.9  MG 2.2 2.4 2.5* 2.7*  --  2.6*  --   PHOS 2.7 3.2 4.3 4.8*  --  3.9  --    GFR: Estimated Creatinine Clearance: 65.5 mL/min (A) (by C-G formula based on SCr of 1.37 mg/dL (H)). Liver Function Tests: Recent Labs  Lab 11/22/17 1048  AST 23  ALT  29  ALKPHOS 103  BILITOT 0.9  PROT 7.7  ALBUMIN 3.4*   No results for input(s): LIPASE, AMYLASE in the last 168 hours. No results for input(s): AMMONIA in the last 168 hours. Coagulation Profile: No results for input(s): INR, PROTIME in the last 168 hours. Cardiac Enzymes: Recent Labs  Lab 11/22/17 1048 11/22/17 2000 11/23/17 0244 11/23/17 0723 11/23/17 1413  TROPONINI 0.08* 0.23* 0.23*  0.12* 0.08* 0.07*  BNP (last 3 results) No results for input(s): PROBNP in the last 8760 hours. HbA1C: No results for input(s): HGBA1C in the last 72 hours. CBG: Recent Labs  Lab 11/24/17 2328 11/25/17 0408 11/25/17 0759 11/25/17 1251 11/25/17 1631  GLUCAP 130* 146* 148* 137* 137*   Lipid Profile: No results for input(s): CHOL, HDL, LDLCALC, TRIG, CHOLHDL, LDLDIRECT in the last 72 hours. Thyroid Function Tests: No results for input(s): TSH, T4TOTAL, FREET4, T3FREE, THYROIDAB in the last 72 hours. Anemia Panel: No results for input(s): VITAMINB12, FOLATE, FERRITIN, TIBC, IRON, RETICCTPCT in the last 72 hours. Sepsis Labs: Recent Labs  Lab 11/22/17 2000 11/23/17 0244 11/24/17 0257  PROCALCITON 0.94 0.95 0.69    Recent Results (from the past 240 hour(s))  Blood culture (routine x 2)     Status: None   Collection Time: 11/22/17 10:47 AM  Result Value Ref Range Status   Specimen Description BLOOD LEFT ARM DRAWN BY RN  Final   Special Requests   Final    BOTTLES DRAWN AEROBIC AND ANAEROBIC Blood Culture adequate volume   Culture   Final    NO GROWTH 5 DAYS Performed at [facility], [address], [place], [place] [address]    Report Status 11/27/2017 FINAL  Final  Blood culture (routine x 2)     Status: None   Collection Time: 11/22/17 10:47 AM  Result Value Ref Range Status   Specimen Description BLOOD RIGHT HAND  Final   Special Requests   Final    BOTTLES DRAWN AEROBIC ONLY Blood Culture results may not be optimal due to an inadequate volume of blood received in  culture bottles   Culture   Final    NO GROWTH 5 DAYS Performed at [facility], [address] [place], [place] [address]    Report Status 11/27/2017 FINAL  Final  Culture, respiratory (NON-Expectorated)     Status: None   Collection Time: 11/22/17 12:48 PM  Result Value Ref Range Status   Specimen Description   Final    TRACHEAL ASPIRATE Performed at [facility], [address], [place], [place] [address]    Special Requests   Final    NONE Performed at [facility], [address] [place], [place] [address]    Gram Stain   Final    ABUNDANT WBC PRESENT, PREDOMINANTLY PMN MODERATE GRAM NEGATIVE RODS Performed at [facility], [address] [place], [place] [address]    Culture RARE STAPHYLOCOCCUS AUREUS  Final   Report Status 11/25/2017 FINAL  Final   Organism ID, Bacteria STAPHYLOCOCCUS AUREUS  Final      Susceptibility   Staphylococcus aureus - MIC*    CIPROFLOXACIN <=0.5 SENSITIVE Sensitive     ERYTHROMYCIN RESISTANT Resistant     GENTAMICIN <=0.5 SENSITIVE Sensitive     OXACILLIN <=0.25 SENSITIVE Sensitive     TETRACYCLINE <=1 SENSITIVE Sensitive     VANCOMYCIN <=0.5 SENSITIVE Sensitive     TRIMETH/SULFA <=10 SENSITIVE Sensitive     CLINDAMYCIN RESISTANT Resistant     RIFAMPIN <=0.5 SENSITIVE Sensitive     Inducible Clindamycin POSITIVE Resistant     * RARE STAPHYLOCOCCUS AUREUS  Respiratory Panel by PCR     Status: Abnormal   Collection Time: 11/22/17  6:00 PM  Result Value Ref Range Status   Adenovirus NOT DETECTED NOT DETECTED Final   Coronavirus 229E NOT DETECTED NOT DETECTED Final   Coronavirus HKU1 NOT DETECTED NOT DETECTED Final   Coronavirus NL63 NOT DETECTED NOT DETECTED  Final   Coronavirus OC43 NOT DETECTED NOT DETECTED Final   Metapneumovirus NOT DETECTED NOT DETECTED Final   Rhinovirus / Enterovirus DETECTED (A) NOT DETECTED Final   Influenza A NOT DETECTED NOT DETECTED Final   Influenza B NOT DETECTED NOT DETECTED Final   Parainfluenza  Virus 1 NOT DETECTED NOT DETECTED Final   Parainfluenza Virus 2 NOT DETECTED NOT DETECTED Final   Parainfluenza Virus 3 NOT DETECTED NOT DETECTED Final   Parainfluenza Virus 4 NOT DETECTED NOT DETECTED Final   Respiratory Syncytial Virus NOT DETECTED NOT DETECTED Final   Bordetella pertussis NOT DETECTED NOT DETECTED Final   Chlamydophila pneumoniae NOT DETECTED NOT DETECTED Final   Mycoplasma pneumoniae NOT DETECTED NOT DETECTED Final  Urine culture     Status: None   Collection Time: 11/22/17  6:00 PM  Result Value Ref Range Status   Specimen Description URINE, RANDOM  Final   Special Requests NONE  Final   Culture   Final    NO GROWTH Performed at [facility], [address], [place], [place] [address]    Report Status 11/24/2017 FINAL  Final  MRSA PCR Screening     Status: Abnormal   Collection Time: 11/22/17  6:00 PM  Result Value Ref Range Status   MRSA by PCR INVALID RESULTS, SPECIMEN SENT FOR CULTURE (A) NEGATIVE Final    Comment: W COUNCIL RN 11/23/17 0110 JDW Performed at [facility], [address], [place], [place] [address]   MRSA culture     Status: None   Collection Time: 11/22/17  6:00 PM  Result Value Ref Range Status   Specimen Description NASOPHARYNGEAL  Final   Special Requests NONE  Final   Culture   Final    NO MRSA DETECTED Performed at [facility], [address], [place], [place] [address]    Report Status 11/24/2017 FINAL  Final  Culture, respiratory (NON-Expectorated)     Status: None   Collection Time: 11/22/17  6:03 PM  Result Value Ref Range Status   Specimen Description TRACHEAL ASPIRATE  Final   Special Requests NONE  Final   Gram Stain   Final    ABUNDANT WBC PRESENT, PREDOMINANTLY PMN FEW GRAM NEGATIVE RODS RARE GRAM POSITIVE COCCI    Culture   Final    RARE Consistent with normal respiratory flora. Performed at [facility], [address], [place], [place] [address]    Report Status 11/25/2017 FINAL  Final    Culture, blood (routine x 2)     Status: None   Collection Time: 11/22/17  7:30 PM  Result Value Ref Range Status   Specimen Description BLOOD LEFT HAND  Final   Special Requests IN PEDIATRIC BOTTLE Blood Culture adequate volume  Final   Culture   Final    NO GROWTH 5 DAYS Performed at [facility], [address] [place], [place] [address]    Report Status 11/27/2017 FINAL  Final  Culture, blood (routine x 2)     Status: None   Collection Time: 11/22/17  7:40 PM  Result Value Ref Range Status   Specimen Description BLOOD RIGHT ARM  Final   Special Requests IN PEDIATRIC BOTTLE Blood Culture adequate volume  Final   Culture   Final    NO GROWTH 5 DAYS Performed at [facility], [address] [place], [place] [address]    Report Status 11/27/2017 FINAL  Final  MRSA PCR Screening  Status: None   Collection Time: 11/27/17 11:15 AM  Result Value Ref Range Status   MRSA by PCR NEGATIVE NEGATIVE Final    Comment:        The GeneXpert MRSA Assay (FDA approved for NASAL specimens only), is one component of a comprehensive MRSA colonization surveillance program. It is not intended to diagnose MRSA infection nor to guide or monitor treatment for MRSA infections.        Radiology Studies: No results found.    Scheduled Meds: . arformoterol  15 mcg Nebulization BID  . budesonide (PULMICORT) nebulizer solution  0.5 mg Nebulization BID  . furosemide  20 mg Oral Daily  . gabapentin  300 mg Oral QHS  . heparin  5,000 Units Subcutaneous Q8H  . ipratropium-albuterol  3 mL Nebulization TID  . pantoprazole  40 mg Oral Q1200  . [START ON 11/29/2017] predniSONE  50 mg Oral Q breakfast  . rOPINIRole  0.25 mg Oral QHS   Continuous Infusions: . sodium chloride Stopped (11/27/17 1330)  .  ceFAZolin (ANCEF) IV Stopped (11/28/17 0433)     LOS: 6 days    Time spent: 30 minutes   Noralee StainJennifer Braelyn Jenson, DO [facility] [email or web address] Password [email or web address] 11/28/2017, 12:18  PM

## 2017-11-29 ENCOUNTER — Encounter: Payer: Self-pay | Admitting: *Deleted

## 2017-11-29 ENCOUNTER — Ambulatory Visit: Payer: Self-pay | Admitting: Internal Medicine

## 2017-11-29 DIAGNOSIS — I5189 Other ill-defined heart diseases: Secondary | ICD-10-CM | POA: Diagnosis not present

## 2017-11-29 DIAGNOSIS — J9600 Acute respiratory failure, unspecified whether with hypoxia or hypercapnia: Secondary | ICD-10-CM | POA: Diagnosis not present

## 2017-11-29 DIAGNOSIS — J439 Emphysema, unspecified: Secondary | ICD-10-CM | POA: Diagnosis not present

## 2017-11-29 DIAGNOSIS — J9621 Acute and chronic respiratory failure with hypoxia: Secondary | ICD-10-CM | POA: Diagnosis not present

## 2017-11-29 DIAGNOSIS — I509 Heart failure, unspecified: Secondary | ICD-10-CM | POA: Diagnosis not present

## 2017-11-29 DIAGNOSIS — J9601 Acute respiratory failure with hypoxia: Secondary | ICD-10-CM | POA: Diagnosis not present

## 2017-11-29 DIAGNOSIS — J449 Chronic obstructive pulmonary disease, unspecified: Secondary | ICD-10-CM | POA: Diagnosis not present

## 2017-11-29 DIAGNOSIS — J42 Unspecified chronic bronchitis: Secondary | ICD-10-CM | POA: Diagnosis not present

## 2017-11-29 DIAGNOSIS — J9611 Chronic respiratory failure with hypoxia: Secondary | ICD-10-CM | POA: Diagnosis not present

## 2017-11-29 DIAGNOSIS — J984 Other disorders of lung: Secondary | ICD-10-CM | POA: Diagnosis not present

## 2017-11-29 DIAGNOSIS — M6281 Muscle weakness (generalized): Secondary | ICD-10-CM | POA: Diagnosis not present

## 2017-11-29 DIAGNOSIS — R300 Dysuria: Secondary | ICD-10-CM | POA: Diagnosis not present

## 2017-11-29 DIAGNOSIS — R5381 Other malaise: Secondary | ICD-10-CM | POA: Diagnosis not present

## 2017-11-29 DIAGNOSIS — J189 Pneumonia, unspecified organism: Secondary | ICD-10-CM | POA: Diagnosis not present

## 2017-11-29 DIAGNOSIS — J9602 Acute respiratory failure with hypercapnia: Secondary | ICD-10-CM | POA: Diagnosis not present

## 2017-11-29 DIAGNOSIS — F17200 Nicotine dependence, unspecified, uncomplicated: Secondary | ICD-10-CM | POA: Diagnosis not present

## 2017-11-29 DIAGNOSIS — R600 Localized edema: Secondary | ICD-10-CM | POA: Diagnosis not present

## 2017-11-29 DIAGNOSIS — J441 Chronic obstructive pulmonary disease with (acute) exacerbation: Secondary | ICD-10-CM | POA: Diagnosis not present

## 2017-11-29 LAB — BASIC METABOLIC PANEL
ANION GAP: 9 (ref 5–15)
BUN: 40 mg/dL — ABNORMAL HIGH (ref 6–20)
CALCIUM: 8.4 mg/dL — AB (ref 8.9–10.3)
CO2: 28 mmol/L (ref 22–32)
CREATININE: 1.22 mg/dL (ref 0.61–1.24)
Chloride: 102 mmol/L (ref 101–111)
Glucose, Bld: 94 mg/dL (ref 65–99)
Potassium: 3.9 mmol/L (ref 3.5–5.1)
SODIUM: 139 mmol/L (ref 135–145)

## 2017-11-29 LAB — CBC
HEMATOCRIT: 42.3 % (ref 39.0–52.0)
Hemoglobin: 13.6 g/dL (ref 13.0–17.0)
MCH: 31.4 pg (ref 26.0–34.0)
MCHC: 32.2 g/dL (ref 30.0–36.0)
MCV: 97.7 fL (ref 78.0–100.0)
Platelets: 254 10*3/uL (ref 150–400)
RBC: 4.33 MIL/uL (ref 4.22–5.81)
RDW: 12.9 % (ref 11.5–15.5)
WBC: 12.9 10*3/uL — AB (ref 4.0–10.5)

## 2017-11-29 MED ORDER — PANTOPRAZOLE SODIUM 40 MG PO TBEC: 40.0000 mg | DELAYED_RELEASE_TABLET | Freq: Every day | ORAL | 0 refills | Status: DC

## 2017-11-29 MED ORDER — PREDNISONE 10 MG PO TABS: ORAL_TABLET | ORAL | 0 refills | Status: DC

## 2017-11-29 MED ORDER — TIOTROPIUM BROMIDE MONOHYDRATE 18 MCG IN CAPS: 18.0000 ug | ORAL_CAPSULE | Freq: Every day | RESPIRATORY_TRACT | 0 refills | Status: DC

## 2017-11-29 MED ORDER — MOMETASONE FURO-FORMOTEROL FUM 200-5 MCG/ACT IN AERO: 2.0000 | INHALATION_SPRAY | Freq: Two times a day (BID) | RESPIRATORY_TRACT | 0 refills | Status: DC

## 2017-11-29 MED ORDER — GABAPENTIN 600 MG PO TABS: 300.0000 mg | ORAL_TABLET | Freq: Every day | ORAL | 0 refills | Status: DC

## 2017-11-29 MED ORDER — ALPRAZOLAM 1 MG PO TABS: 1.0000 mg | ORAL_TABLET | Freq: Three times a day (TID) | ORAL | 0 refills | Status: AC | PRN

## 2017-11-29 NOTE — Clinical Social Work Note (Signed)
CSW facilitated patient discharge including contacting patient family and facility to confirm patient discharge plans. Clinical information faxed to facility and family agreeable with plan. CSW arranged [facility] [place] at 1:00 pm. RN to call report prior to discharge [phone]).  CSW will sign off for now as social work intervention is no longer needed. Please consult us again if new needs arise.  Charlynn CourtSarah [name] Holz, CSW [phone]

## 2017-11-29 NOTE — Clinical Social Work Note (Signed)
Patient's daughter-in-law, Lawanna Kobusngel, will bring the patient's cpap machine to the facility and complete his admissions paperwork. Admissions coordinator will call her now to set up a time.  Charlynn CourtSarah Tranice Laduke, CSW [phone]

## 2017-11-29 NOTE — Patient Outreach (Signed)
[name] Care Management  11/29/2017  Dustin CoriaDennis R [name] 1956-06-27 [id number]   CSW received ADT notification that [facility] [place]. CSW notified [facility] [place] CSW, I[place]rystal who [facility].    Lincoln MaxinKelly Juwana Thoreson, [name] [name]  Clinical Social Worker cell #: [phone]

## 2017-11-29 NOTE — Discharge Summary (Signed)
Physician Discharge Summary  Dustin [name] IEP:[id number] DOB: 1955-10-13 DOA: 11/22/2017  PCP: Lyndon Code, MD  Admit date: 11/22/2017 Discharge date: 11/29/2017  Admitted From: Home Disposition:  SNF   Recommendations for Outpatient Follow-up:  1. Follow up with PCP in 1 week 2. Please obtain CBC once off steroids to ensure resolution leukocytosis  3. Possible spiculated opacity in the right mid lung. CT chest could be obtained to further evaluate.  Discharge Condition: Stable CODE STATUS: Full  Diet recommendation: Heart healthy   Brief/Interim Summary: Dustin [name] is a 62 year old male with history of COPD with ongoing tobacco abuse, chronic diastolic CHF, hypertension, recent pneumonia treated as outpt presented 3/7 to [facility] ER in profound respiratory distress. He was admitted to ICU with acute hypoxic reps failure/COPD/PNA requiring mechanical ventilation, +Rhinovirus and was also noted to have fluid overload requiring diuresis. He was extubated and remained stable, transferred from [facility] to [facility] 3/12. He was treated with IV antibiotics for pneumonia and completed 7 day treatment. He was also treated with IV lasix, deescalated to PO home lasix 20mg  daily as well as solumedrol/nebs. PT recommended SNF and patient was in agreement. On day of discharge, he had no complaints; no chest pain or worsening SOB, nausea, vomiting, edema. He [place] O2 was weaned to home use of 2L.   Discharge Diagnoses:  Principal Problem:   Acute on chronic respiratory failure with hypoxia (HCC) Active Problems:   COPD exacerbation (HCC)   Acute on chronic diastolic CHF (congestive heart failure) (HCC)   Anxiety   HCAP (healthcare-associated pneumonia)  Acute on chronic hypoxicrespiratory failure -Due to COPD exacerbation/PNA, +Rhinovirus, CHF exacerbation  -Tracheal aspirate with normal respiratory flora, rare staph aureus  -Extubated 3/10 -Total 7 days of IV antibiotics completed  -Weaned down  to home 2L [place] baseline   Acute on chronic diastolic CHF -EF 66-06%, grade 1 diastolic dysfunction  -Net -5.6 L overall  -Improved -Transitioned to PO lasix 20mg  daily at home dose   COPD exacerbation  -Needs nebs/ICS LABA and spiriva at DC -De-escalate solumedrol to PO prednisone starting AM   Anxiety -Stable, continue xanax PRN  Neuropathy/RLS -Resume gabapentin at lower dose, continue requip   OSA on CPAP -Continue CPAP QHS  Leukocytosis  -Due to steroids, monitor CBC    Discharge Instructions  Discharge Instructions    Diet - low sodium heart healthy   Complete by:  As directed    Discharge instructions   Complete by:  As directed    You were cared for by a hospitalist during your hospital stay. If you have any questions about your discharge medications or the care you received while you were in the hospital after you are discharged, you can call the unit and asked to speak with the hospitalist on call if the hospitalist that took care of you is not available. Once you are discharged, your primary care physician will handle any further medical issues. Please note that NO REFILLS for any discharge medications will be authorized once you are discharged, as it is imperative that you return to your primary care physician (or establish a relationship with a primary care physician if you do not have one) for your aftercare needs so that they can reassess your need for medications and monitor your lab values.   Increase activity slowly   Complete by:  As directed      Allergies as of 11/29/2017      Reactions   Asa [aspirin] Other (See  Comments)   Reaction: swelling of the right side.   Bee Venom Swelling   Codeine Other (See Comments)   Reaction: Difficulty breathing   Ibuprofen Other (See Comments)   Reaction: Swelling of the right side.   Iodinated Diagnostic Agents Other (See Comments)   Reaction:  Unknown  Other reaction(s): Unknown      Medication List     TAKE these medications   Albuterol Sulfate 108 (90 Base) MCG/ACT Aepb Commonly known as:  PROAIR RESPICLICK Inhale 2 puffs into the lungs every 4 (four) hours as needed (for shortness of breath). Reported on 12/10/2015   ALPRAZolam 1 MG tablet Commonly known as:  XANAX Take 1 tablet (1 mg total) by mouth 3 (three) times daily as needed for up to 2 days for anxiety.   furosemide 20 MG tablet Commonly known as:  LASIX Take 1 tablet (20 mg total) by mouth daily.   gabapentin 600 MG tablet Commonly known as:  NEURONTIN Take 0.5 tablets (300 mg total) by mouth at bedtime. What changed:    how much to take  when to take this   ipratropium-albuterol 0.5-2.5 (3) MG/3ML Soln Commonly known as:  DUONEB Take 3 mLs by nebulization 4 (four) times daily as needed (for shortness of breath).   mometasone-formoterol 200-5 MCG/ACT Aero Commonly known as:  DULERA Inhale 2 puffs into the lungs 2 (two) times daily.   OPTICHAMBER ADVANTAGE-LG MASK Misc Use as directed with inhaler diag  j44.1   pantoprazole 40 MG tablet Commonly known as:  PROTONIX Take 1 tablet (40 mg total) by mouth daily at 12 noon.   potassium chloride 10 MEQ tablet Commonly known as:  K-DUR,KLOR-CON Take 10 mEq by mouth daily.   predniSONE 10 MG tablet Commonly known as:  DELTASONE Take 4 tabs for 3 days, then 3 tabs for 3 days, then 2 tabs for 3 days, then 1 tab for 3 days, then 1/2 tab for 4 days.   rOPINIRole 0.25 MG tablet Commonly known as:  REQUIP TAKE 1 TABLET BY MOUTH TWICE DAILY FOR CRAMPS   theophylline 400 MG 24 hr tablet Commonly known as:  UNIPHYL Take 400 mg by mouth daily.   tiotropium 18 MCG inhalation capsule Commonly known as:  SPIRIVA HANDIHALER Place 1 capsule (18 mcg total) into inhaler and inhale daily.       Contact information for follow-up providers    Lyndon Code, MD. Schedule an appointment as soon as possible for a visit in 1 week(s).   Specialty:  Internal Medicine Contact  information: [address] [place] [address] [phone]            Contact information for after-discharge care    Destination    [facility] Follow [facility].   Service:  Skilled Nursing Contact information: [address] [place] [address] [phone]                 Allergies  Allergen Reactions  . Asa [Aspirin] Other (See Comments)    Reaction: swelling of the right side.  . Bee Venom Swelling  . Codeine Other (See Comments)    Reaction: Difficulty breathing  . Ibuprofen Other (See Comments)    Reaction: Swelling of the right side.  . Iodinated Diagnostic Agents Other (See Comments)    Reaction:  Unknown  Other reaction(s): Unknown    Consultations:  PCCM   Procedures/Studies: Dg Chest Port 1 View  Result Date: 11/25/2017 CLINICAL DATA:  Acute respiratory  failure, hypoxia EXAM: PORTABLE CHEST 1 VIEW COMPARISON:  11/24/2017 FINDINGS: Endotracheal tube and NG tube are unchanged. Cardiomegaly. There is hyperinflation of the lungs compatible with COPD. Bilateral lower lobe airspace opacities are again noted, unchanged. Possible small left effusion. No acute bony abnormality. IMPRESSION: COPD, cardiomegaly. Stable bibasilar airspace opacities. Suspect small left effusion. Electronically Signed   By: Charlett NoseKevin  Dover M.D.   On: 11/25/2017 07:13   Dg Chest Port 1 View  Result Date: 11/24/2017 CLINICAL DATA:  Respiratory failure EXAM: PORTABLE CHEST 1 VIEW COMPARISON:  11/23/2017 FINDINGS: Support devices are stable. Cardiomegaly. Bilateral lower lobe airspace opacities are again noted, unchanged. No effusions or acute bony abnormality. Suspect underlying COPD. IMPRESSION: Continued bilateral lower lobe airspace opacities, unchanged. Underlying COPD.  Cardiomegaly. Electronically Signed   By: Charlett NoseKevin  Dover M.D.   On: 11/24/2017 07:26   Dg Chest Port 1 View  Result Date: 11/23/2017 CLINICAL DATA:  Respiratory failure.  Emphysema.  EXAM: PORTABLE CHEST 1 VIEW COMPARISON:  11/22/2017 FINDINGS: Endotracheal tube with the tip 5.5 cm above the carina. Nasogastric tube coursing below the diaphragm. Hyperinflated lungs consistent with COPD. Right upper lobe scarring again noted. Chronic interstitial thickening at the lung bases bilaterally. No focal consolidation, pleural effusion or pneumothorax. Stable cardiomediastinal silhouette. No acute osseous abnormality. IMPRESSION: 1. Endotracheal tube with the tip 5.5 cm above the carina. Electronically Signed   By: Elige KoHetal  Patel   On: 11/23/2017 08:50   Dg Chest Port 1 View  Result Date: 11/22/2017 CLINICAL DATA:  Respiratory failure EXAM: PORTABLE CHEST 1 VIEW COMPARISON:  None. FINDINGS: Endotracheal tube tip is about 5.2 cm superior to the carina. Esophageal tube tip is below the diaphragm but non included. Emphysematous disease in the upper lobes. Small left greater than right pleural effusion. Interstitial and airspace disease at the bilateral bases. Mild cardiomegaly. No pneumothorax. Possible spiculated opacity in the right mid lung IMPRESSION: 1. Endotracheal tube tip about 5.2 cm superior to carina. Esophageal tube tip below the diaphragm but non included 2. Emphysematous disease. Small left greater than right pleural effusions with mixed interstitial and alveolar airspace disease greatest at the bases which may reflect edema or pneumonia. 3. Cardiomegaly 4. Possible spiculated opacity in the right mid lung. CT chest could be obtained to further evaluate. Electronically Signed   By: Jasmine PangKim  Fujinaga M.D.   On: 11/22/2017 20:15   Dg Chest Port 1 View  Result Date: 11/22/2017 CLINICAL DATA:  Severe shortness of breath, productive cough EXAM: PORTABLE CHEST 1 VIEW COMPARISON:  10/19/2017 FINDINGS: Cardiomegaly. Vascular congestion. Interstitial and alveolar opacities throughout the lungs, likely edema. More confluent opacity at the right lung base could reflect asymmetric edema or infection.  IMPRESSION: Cardiomegaly with mild pulmonary edema. More confluent opacity at the right lung base could reflect an area of asymmetric edema or infection. Electronically Signed   By: Charlett NoseKevin  Dover M.D.   On: 11/22/2017 10:47   Dg Chest Port 1v Same Day  Result Date: 11/22/2017 CLINICAL DATA:  POST ET TUBE INTUBATION EXAM: PORTABLE CHEST 1 VIEW COMPARISON:  11/22/2017 FINDINGS: Portable exam was performed on 09/05/2009 showing interval placement of endotracheal tube and 7.7 centimeters above the carina. Heart is enlarged. There are perihilar and basilar opacities compatible with edema LEFT pleural effusion is present. IMPRESSION: Interval placement of endotracheal tube. Pulmonary edema. Electronically Signed   By: Norva PavlovElizabeth  Brown M.D.   On: 11/22/2017 12:31   [place] Ekg Site Rite  Result Date: 11/22/2017 If Site Rite image not attached, placement  could not be confirmed due to current cardiac rhythm.   Echo Study Conclusions  - Left ventricle: The cavity size was normal. Wall thickness was   increased in a pattern of mild LVH. Systolic function was normal.   The estimated ejection fraction was in the range of 50% to 55%.   Basal inferior wall hypokinesis. Doppler parameters are   consistent with abnormal left ventricular relaxation (grade 1   diastolic dysfunction). The E/e&' ratio is >15, suggesting   elevated LV filling pressure. - Aorta: The aortic root is dilated. Aortic root dimension: 45 mm   (ED). - Mitral valve: Mildly thickened leaflets . No significant MV   inflow variation with respiration. There was trivial   regurgitation. - Left atrium: The atrium was normal in size. - Right ventricle: The cavity size was mildly dilated. Wall   thickness was normal. Systolic function is reduced toward the RV   apex. - Right atrium: The atrium was mildly dilated. - Tricuspid valve: There was mild regurgitation. No significant TV   inflow variation with respiration. - Pulmonary arteries: PA peak  pressure: 45 mm Hg (S). - Inferior vena cava: The vessel was dilated. The respirophasic   diameter changes were blunted (< 50%), consistent with elevated   central venous pressure. - Pericardium, extracardiac: A trivial pericardial effusion was   identified. Tamponade physiology cannot be excluded.  Impressions:  - Compared to a prior study in 04/2016, the LVEF is lower at 50-55%.   There is basal inferior wall hypokinesis, grade 1 DD with   elevated LV filling pressure. The aortic root is dilated to 4.5   cm. There is mild RV hypokinesis of the apex, RVSP is 45% with a   dilated IVC. There is a trivial pericardial effusion - tamponade  physiology cannot be excluded.    Discharge Exam: BP 111/73 (BP Location: Right Arm)   Pulse 94   Temp 98.1 F (36.7 C) (Oral)   Resp 20   Ht 5\' 11"  (1.803 m)   Wt 96.3 kg (212 lb 3.2 oz)   SpO2 90%   BMI 29.60 kg/m   General: Pt is alert, awake, not in acute distress Cardiovascular: RRR, S1/S2 +, no rubs, no gallops Respiratory: diminished breath sounds, no wheezing, no rhonchi, no conversational dyspnea, on [place] O2 2L  Abdominal: Soft, NT, ND, bowel sounds + Extremities: no edema, no cyanosis    The results of significant diagnostics from this hospitalization (including imaging, microbiology, ancillary and laboratory) are listed below for reference.     Microbiology: Recent Results (from the past 240 hour(s))  Blood culture (routine x 2)     Status: None   Collection Time: 11/22/17 10:47 AM  Result Value Ref Range Status   Specimen Description BLOOD LEFT ARM DRAWN BY RN  Final   Special Requests   Final    BOTTLES DRAWN AEROBIC AND ANAEROBIC Blood Culture adequate volume   Culture   Final    NO GROWTH 5 DAYS Performed at [facility], [address], [place], [place] [address]    Report Status 11/27/2017 FINAL  Final  Blood culture (routine x 2)     Status: None   Collection Time: 11/22/17 10:47 AM  Result Value Ref Range Status    Specimen Description BLOOD RIGHT HAND  Final   Special Requests   Final    BOTTLES DRAWN AEROBIC ONLY Blood Culture results may not be optimal due to an inadequate volume of blood received in culture bottles  Culture   Final    NO GROWTH 5 DAYS Performed at [facility], [address] [place], [place] [address]    Report Status 11/27/2017 FINAL  Final  Culture, respiratory (NON-Expectorated)     Status: None   Collection Time: 11/22/17 12:48 PM  Result Value Ref Range Status   Specimen Description   Final    TRACHEAL ASPIRATE Performed at [facility], [address] [place], [place] [address]    Special Requests   Final    NONE Performed at [facility], [address] [place], [place] [address]    Gram Stain   Final    ABUNDANT WBC PRESENT, PREDOMINANTLY PMN MODERATE GRAM NEGATIVE RODS Performed at [facility], [address], [place], [place] [address]    Culture RARE STAPHYLOCOCCUS AUREUS  Final   Report Status 11/25/2017 FINAL  Final   Organism ID, Bacteria STAPHYLOCOCCUS AUREUS  Final      Susceptibility   Staphylococcus aureus - MIC*    CIPROFLOXACIN <=0.5 SENSITIVE Sensitive     ERYTHROMYCIN RESISTANT Resistant     GENTAMICIN <=0.5 SENSITIVE Sensitive     OXACILLIN <=0.25 SENSITIVE Sensitive     TETRACYCLINE <=1 SENSITIVE Sensitive     VANCOMYCIN <=0.5 SENSITIVE Sensitive     TRIMETH/SULFA <=10 SENSITIVE Sensitive     CLINDAMYCIN RESISTANT Resistant     RIFAMPIN <=0.5 SENSITIVE Sensitive     Inducible Clindamycin POSITIVE Resistant     * RARE STAPHYLOCOCCUS AUREUS  Respiratory Panel by PCR     Status: Abnormal   Collection Time: 11/22/17  6:00 PM  Result Value Ref Range Status   Adenovirus NOT DETECTED NOT DETECTED Final   Coronavirus 229E NOT DETECTED NOT DETECTED Final   Coronavirus HKU1 NOT DETECTED NOT DETECTED Final   Coronavirus NL63 NOT DETECTED NOT DETECTED Final   Coronavirus OC43 NOT DETECTED NOT DETECTED Final   Metapneumovirus NOT  DETECTED NOT DETECTED Final   Rhinovirus / Enterovirus DETECTED (A) NOT DETECTED Final   Influenza A NOT DETECTED NOT DETECTED Final   Influenza B NOT DETECTED NOT DETECTED Final   Parainfluenza Virus 1 NOT DETECTED NOT DETECTED Final   Parainfluenza Virus 2 NOT DETECTED NOT DETECTED Final   Parainfluenza Virus 3 NOT DETECTED NOT DETECTED Final   Parainfluenza Virus 4 NOT DETECTED NOT DETECTED Final   Respiratory Syncytial Virus NOT DETECTED NOT DETECTED Final   Bordetella pertussis NOT DETECTED NOT DETECTED Final   Chlamydophila pneumoniae NOT DETECTED NOT DETECTED Final   Mycoplasma pneumoniae NOT DETECTED NOT DETECTED Final  Urine culture     Status: None   Collection Time: 11/22/17  6:00 PM  Result Value Ref Range Status   Specimen Description URINE, RANDOM  Final   Special Requests NONE  Final   Culture   Final    NO GROWTH Performed at [facility], [address] [place], [place] [address]    Report Status 11/24/2017 FINAL  Final  MRSA PCR Screening     Status: Abnormal   Collection Time: 11/22/17  6:00 PM  Result Value Ref Range Status   MRSA by PCR INVALID RESULTS, SPECIMEN SENT FOR CULTURE (A) NEGATIVE Final    Comment: W COUNCIL RN 11/23/17 0110 JDW Performed at [facility], [address] [place], [place] [address]   MRSA culture     Status: None   Collection Time: 11/22/17  6:00 PM  Result Value Ref Range Status   Specimen Description NASOPHARYNGEAL  Final   Special Requests NONE  Final   Culture   Final    NO MRSA DETECTED Performed at [facility], [address] [place], [place] [address]    Report Status 11/24/2017 FINAL  Final  Culture, respiratory (NON-Expectorated)     Status: None   Collection Time: 11/22/17  6:03 PM  Result Value Ref Range Status   Specimen Description TRACHEAL ASPIRATE  Final   Special Requests NONE  Final   Gram Stain   Final    ABUNDANT WBC PRESENT, PREDOMINANTLY PMN FEW GRAM NEGATIVE RODS RARE GRAM POSITIVE  COCCI    Culture   Final    RARE Consistent with normal respiratory flora. Performed at [facility], [address], [place], [place] [address]    Report Status 11/25/2017 FINAL  Final  Culture, blood (routine x 2)     Status: None   Collection Time: 11/22/17  7:30 PM  Result Value Ref Range Status   Specimen Description BLOOD LEFT HAND  Final   Special Requests IN PEDIATRIC BOTTLE Blood Culture adequate volume  Final   Culture   Final    NO GROWTH 5 DAYS Performed at [facility], [address], [place], [place] [address]    Report Status 11/27/2017 FINAL  Final  Culture, blood (routine x 2)     Status: None   Collection Time: 11/22/17  7:40 PM  Result Value Ref Range Status   Specimen Description BLOOD RIGHT ARM  Final   Special Requests IN PEDIATRIC BOTTLE Blood Culture adequate volume  Final   Culture   Final    NO GROWTH 5 DAYS Performed at [facility], [address], [place], [place] [address]    Report Status 11/27/2017 FINAL  Final  MRSA PCR Screening     Status: None   Collection Time: 11/27/17 11:15 AM  Result Value Ref Range Status   MRSA by PCR NEGATIVE NEGATIVE Final    Comment:        The GeneXpert MRSA Assay (FDA approved for NASAL specimens only), is one component of a comprehensive MRSA colonization surveillance program. It is not intended to diagnose MRSA infection nor to guide or monitor treatment for MRSA infections.      Labs: BNP (last 3 results) Recent Labs    01/22/17 1614 06/28/17 1721 11/22/17 1048  BNP 200.0* 68.0 407.0*   Basic Metabolic Panel: Recent Labs  Lab 11/23/17 0244 11/24/17 0257 11/24/17 1504 11/25/17 0747 11/26/17 0312 11/27/17 0300 11/28/17 0444 11/29/17 0642  NA 145 143  --  143 139 138 140 139  K 4.8 3.7  --  4.1 4.4 4.6 3.9 3.9  CL 103 103  --  103 102 101 98* 102  CO2 23 27  --  28 28 27 31 28   GLUCOSE 144* 147*  --  167* 118* 128* 91 94  BUN 23* 34*  --  43* 38* 40* 46* 40*   CREATININE 1.75* 1.73*  --  1.48* 1.12 1.23 1.37* 1.22  CALCIUM 9.0 9.0  --  8.9 8.6* 8.7* 8.9 8.4*  MG 2.2 2.4 2.5* 2.7*  --  2.6*  --   --   PHOS 2.7 3.2 4.3 4.8*  --  3.9  --   --    Liver Function Tests: Recent Labs  Lab 11/22/17 1048  AST 23  ALT 29  ALKPHOS 103  BILITOT 0.9  PROT 7.7  ALBUMIN 3.4*   No  results for input(s): LIPASE, AMYLASE in the last 168 hours. No results for input(s): AMMONIA in the last 168 hours. CBC: Recent Labs  Lab 11/22/17 1048  11/25/17 0747 11/26/17 0312 11/27/17 0300 11/28/17 0822 11/29/17 0642  WBC 9.8   < > 11.4* 11.8* 13.2* 16.7* 12.9*  NEUTROABS 7.6  --   --   --   --   --   --   HGB 12.5*   < > 10.9* 11.0* 12.1* 14.5 13.6  HCT 40.6   < > 35.5* 35.4* 37.1* 43.9 42.3  MCV 105.2*   < > 102.3* 100.6* 98.1 97.8 97.7  PLT 188   < > 228 231 246 303 254   < > = values in this interval not displayed.   Cardiac Enzymes: Recent Labs  Lab 11/22/17 1048 11/22/17 2000 11/23/17 0244 11/23/17 0723 11/23/17 1413  TROPONINI 0.08* 0.23* 0.23*  0.12* 0.08* 0.07*   BNP: Invalid input(s): POCBNP CBG: Recent Labs  Lab 11/24/17 2328 11/25/17 0408 11/25/17 0759 11/25/17 1251 11/25/17 1631  GLUCAP 130* 146* 148* 137* 137*   D-Dimer No results for input(s): DDIMER in the last 72 hours. Hgb A1c No results for input(s): HGBA1C in the last 72 hours. Lipid Profile No results for input(s): CHOL, HDL, LDLCALC, TRIG, CHOLHDL, LDLDIRECT in the last 72 hours. Thyroid function studies No results for input(s): TSH, T4TOTAL, T3FREE, THYROIDAB in the last 72 hours.  Invalid input(s): FREET3 Anemia work up No results for input(s): VITAMINB12, FOLATE, FERRITIN, TIBC, IRON, RETICCTPCT in the last 72 hours. Urinalysis    Component Value Date/Time   COLORURINE STRAW (A) 08/06/2017 1830   APPEARANCEUR CLEAR (A) 08/06/2017 1830   APPEARANCEUR Hazy 07/23/2014 2110   LABSPEC 1.005 08/06/2017 1830   LABSPEC 1.027 07/23/2014 2110   PHURINE 7.0  08/06/2017 1830   GLUCOSEU NEGATIVE 08/06/2017 1830   GLUCOSEU Negative 07/23/2014 2110   HGBUR NEGATIVE 08/06/2017 1830   BILIRUBINUR NEGATIVE 08/06/2017 1830   BILIRUBINUR Negative 07/23/2014 2110   KETONESUR NEGATIVE 08/06/2017 1830   PROTEINUR NEGATIVE 08/06/2017 1830   NITRITE NEGATIVE 08/06/2017 1830   LEUKOCYTESUR NEGATIVE 08/06/2017 1830   LEUKOCYTESUR Negative 07/23/2014 2110   Sepsis Labs Invalid input(s): PROCALCITONIN,  WBC,  LACTICIDVEN Microbiology Recent Results (from the past 240 hour(s))  Blood culture (routine x 2)     Status: None   Collection Time: 11/22/17 10:47 AM  Result Value Ref Range Status   Specimen Description BLOOD LEFT ARM DRAWN BY RN  Final   Special Requests   Final    BOTTLES DRAWN AEROBIC AND ANAEROBIC Blood Culture adequate volume   Culture   Final    NO GROWTH 5 DAYS Performed at [facility], [address] [place], [place] [address]    Report Status 11/27/2017 FINAL  Final  Blood culture (routine x 2)     Status: None   Collection Time: 11/22/17 10:47 AM  Result Value Ref Range Status   Specimen Description BLOOD RIGHT HAND  Final   Special Requests   Final    BOTTLES DRAWN AEROBIC ONLY Blood Culture results may not be optimal due to an inadequate volume of blood received in culture bottles   Culture   Final    NO GROWTH 5 DAYS Performed at [facility], [address] [place], [place] [address]    Report Status 11/27/2017 FINAL  Final  Culture, respiratory (NON-Expectorated)     Status: None   Collection Time: 11/22/17 12:48 PM  Result Value Ref Range  Status   Specimen Description   Final    TRACHEAL ASPIRATE Performed at [facility], [address] [place], [place] [address]    Special Requests   Final    NONE Performed at [facility], [address], [place], [place] [address]    Gram Stain   Final    ABUNDANT WBC PRESENT, PREDOMINANTLY PMN MODERATE GRAM NEGATIVE RODS Performed at [facility], [address],  [place], [place] [address]    Culture RARE STAPHYLOCOCCUS AUREUS  Final   Report Status 11/25/2017 FINAL  Final   Organism ID, Bacteria STAPHYLOCOCCUS AUREUS  Final      Susceptibility   Staphylococcus aureus - MIC*    CIPROFLOXACIN <=0.5 SENSITIVE Sensitive     ERYTHROMYCIN RESISTANT Resistant     GENTAMICIN <=0.5 SENSITIVE Sensitive     OXACILLIN <=0.25 SENSITIVE Sensitive     TETRACYCLINE <=1 SENSITIVE Sensitive     VANCOMYCIN <=0.5 SENSITIVE Sensitive     TRIMETH/SULFA <=10 SENSITIVE Sensitive     CLINDAMYCIN RESISTANT Resistant     RIFAMPIN <=0.5 SENSITIVE Sensitive     Inducible Clindamycin POSITIVE Resistant     * RARE STAPHYLOCOCCUS AUREUS  Respiratory Panel by PCR     Status: Abnormal   Collection Time: 11/22/17  6:00 PM  Result Value Ref Range Status   Adenovirus NOT DETECTED NOT DETECTED Final   Coronavirus 229E NOT DETECTED NOT DETECTED Final   Coronavirus HKU1 NOT DETECTED NOT DETECTED Final   Coronavirus NL63 NOT DETECTED NOT DETECTED Final   Coronavirus OC43 NOT DETECTED NOT DETECTED Final   Metapneumovirus NOT DETECTED NOT DETECTED Final   Rhinovirus / Enterovirus DETECTED (A) NOT DETECTED Final   Influenza A NOT DETECTED NOT DETECTED Final   Influenza B NOT DETECTED NOT DETECTED Final   Parainfluenza Virus 1 NOT DETECTED NOT DETECTED Final   Parainfluenza Virus 2 NOT DETECTED NOT DETECTED Final   Parainfluenza Virus 3 NOT DETECTED NOT DETECTED Final   Parainfluenza Virus 4 NOT DETECTED NOT DETECTED Final   Respiratory Syncytial Virus NOT DETECTED NOT DETECTED Final   Bordetella pertussis NOT DETECTED NOT DETECTED Final   Chlamydophila pneumoniae NOT DETECTED NOT DETECTED Final   Mycoplasma pneumoniae NOT DETECTED NOT DETECTED Final  Urine culture     Status: None   Collection Time: 11/22/17  6:00 PM  Result Value Ref Range Status   Specimen Description URINE, RANDOM  Final   Special Requests NONE  Final   Culture   Final    NO GROWTH Performed at [facility], [address], [place], [place] [address]    Report Status 11/24/2017 FINAL  Final  MRSA PCR Screening     Status: Abnormal   Collection Time: 11/22/17  6:00 PM  Result Value Ref Range Status   MRSA by PCR INVALID RESULTS, SPECIMEN SENT FOR CULTURE (A) NEGATIVE Final    Comment: W COUNCIL RN 11/23/17 0110 JDW Performed at [facility], [address] [place], [place] [address]   MRSA culture     Status: None   Collection Time: 11/22/17  6:00 PM  Result Value Ref Range Status   Specimen Description NASOPHARYNGEAL  Final   Special Requests NONE  Final   Culture   Final    NO MRSA DETECTED Performed at [facility], [address], [place], [place] [address]    Report Status 11/24/2017 FINAL  Final  Culture, respiratory (NON-Expectorated)     Status:  None   Collection Time: 11/22/17  6:03 PM  Result Value Ref Range Status   Specimen Description TRACHEAL ASPIRATE  Final   Special Requests NONE  Final   Gram Stain   Final    ABUNDANT WBC PRESENT, PREDOMINANTLY PMN FEW GRAM NEGATIVE RODS RARE GRAM POSITIVE COCCI    Culture   Final    RARE Consistent with normal respiratory flora. Performed at [facility], [address] [place], [place] [address]    Report Status 11/25/2017 FINAL  Final  Culture, blood (routine x 2)     Status: None   Collection Time: 11/22/17  7:30 PM  Result Value Ref Range Status   Specimen Description BLOOD LEFT HAND  Final   Special Requests IN PEDIATRIC BOTTLE Blood Culture adequate volume  Final   Culture   Final    NO GROWTH 5 DAYS Performed at [facility], [address], [place], [place] [address]    Report Status 11/27/2017 FINAL  Final  Culture, blood (routine x 2)     Status: None   Collection Time: 11/22/17  7:40 PM  Result Value Ref Range Status   Specimen Description BLOOD RIGHT ARM  Final   Special Requests IN PEDIATRIC BOTTLE Blood Culture adequate volume  Final   Culture   Final    NO GROWTH 5  DAYS Performed at [facility], [address] [place], [place] [address]    Report Status 11/27/2017 FINAL  Final  MRSA PCR Screening     Status: None   Collection Time: 11/27/17 11:15 AM  Result Value Ref Range Status   MRSA by PCR NEGATIVE NEGATIVE Final    Comment:        The GeneXpert MRSA Assay (FDA approved for NASAL specimens only), is one component of a comprehensive MRSA colonization surveillance program. It is not intended to diagnose MRSA infection nor to guide or monitor treatment for MRSA infections.      Patient was seen and examined on the day of discharge and was found to be in stable condition. Time coordinating discharge: 35 minutes including assessment and coordination of care, as well as examination of the patient.   SIGNED:  Noralee Stain, DO [facility] Pager [phone]  If 7PM-7AM, please contact night-coverage [email or web address] Password [email or web address] 11/29/2017, 9:26 AM

## 2017-11-29 NOTE — Consult Note (Signed)
   [facility] CM Inpatient Consult   11/29/2017  JONANTHAN BOLENDER 03/19/1956 [id number]  Patient assessed for multiple admissions in the past 6 months, in the [employer]. Patient is currently active with [facility] for chronic disease management services.  Patient has been engaged by a [employer].  Our community based plan of care has focused on disease management and community resource support.  Patient will receive a post discharge transition of care call and will be evaluated for monthly home visits for assessments and disease process education.  Met with the patient at the bedside.  He endorses ongoing follow up with [facility]. Patient to go to [facility] for rehab.  Will have a [facility] social worker follow patient with transition from facility back home, if appropriate.    Made Inpatient Case Manager aware that [facility] following. Of note, [facility] Care Management services does not replace or interfere with any services that are needed or arranged by inpatient case management or social work.  For additional questions or referrals please contact:  Natividad Brood, RN BSN [facility] Liaison  [phone] business mobile phone Toll free office [phone]

## 2017-11-29 NOTE — Clinical Social Work Placement (Signed)
   CLINICAL SOCIAL WORK PLACEMENT  NOTE  Date:  11/29/2017  Patient Details  Name: Dustin [name] MRN: [id number] Date of Birth: 10/18/1955  Clinical Social Work is seeking post-discharge placement for this patient at the Skilled  Nursing Facility level of care (*CSW will initial, date and re-position this form in  chart as items are completed):  Yes   Patient/family provided with [facility] Clinical Social Work Department's list of facilities offering this level of care within the geographic area requested by the patient (or if unable, by the patient's family).  Yes   Patient/family informed of their freedom to choose among providers that offer the needed level of care, that participate in Medicare, Medicaid or managed care program needed by the patient, have an available bed and are willing to accept the patient.  Yes   Patient/family informed of [facility]'s ownership interest in [facility] and [facility], as well as of the fact that they are under no obligation to receive care at these facilities.  PASRR submitted to EDS on       PASRR number received on       Existing PASRR number confirmed on 11/27/17     FL2 transmitted to all facilities in geographic area requested by pt/family on 11/27/17     FL2 transmitted to all facilities within larger geographic area on       Patient informed that his/her managed care company has contracts with or will negotiate with certain facilities, including the following:        Yes   Patient/family informed of bed offers received.  [facility]     Physician recommends and patient chooses bed at      [facility] to be [facility] on 11/29/17.  Patient to be transferred to facility by [facility]     Patient family notified on 11/29/17 of transfer.  Name of family member notified:  Windy KalataAngel Reedy     PHYSICIAN Please prepare prescriptions     Additional Comment:     _______________________________________________ Margarito LinerSarah C Mallika Sanmiguel, [name] 11/29/2017, 11:39 AM

## 2017-11-29 NOTE — Plan of Care (Signed)
  Clinical Measurements: Cardiovascular complication will be avoided 11/29/2017 0724 - Progressing by Chaya Janunn, Jaycob Mcclenton K, RN   Coping: Level of anxiety will decrease 11/29/2017 0724 - Progressing by Chaya Janunn, Shaima Sardinas K, RN   Respiratory: Ability to maintain a clear airway and adequate ventilation will improve 11/29/2017 0724 - Progressing by Chaya Janunn, Charity Tessier K, RN   Cardiac: Ability to achieve and maintain adequate cardiopulmonary perfusion will improve 11/29/2017 0724 - Progressing by Chaya Janunn, Josecarlos Harriott K, RN

## 2017-11-30 ENCOUNTER — Other Ambulatory Visit: Payer: Self-pay | Admitting: *Deleted

## 2017-11-30 NOTE — Patient Outreach (Signed)
Pt discharged hospital 11/29/17, admitted to skilled nursing facility, in basket [facility] CSW Lincoln MaxinKelly Harrison to inform and CSW will continue to follow.  Case closed for nursing  Irving ShowsJulie Jessicca Stitzer [facility], BSN [facility] Coordinator [phone]

## 2017-12-04 DIAGNOSIS — J441 Chronic obstructive pulmonary disease with (acute) exacerbation: Secondary | ICD-10-CM | POA: Diagnosis not present

## 2017-12-04 DIAGNOSIS — R5381 Other malaise: Secondary | ICD-10-CM | POA: Diagnosis not present

## 2017-12-04 DIAGNOSIS — J9602 Acute respiratory failure with hypercapnia: Secondary | ICD-10-CM | POA: Diagnosis not present

## 2017-12-04 DIAGNOSIS — J9601 Acute respiratory failure with hypoxia: Secondary | ICD-10-CM | POA: Diagnosis not present

## 2017-12-06 ENCOUNTER — Ambulatory Visit: Payer: Self-pay | Admitting: *Deleted

## 2017-12-06 ENCOUNTER — Other Ambulatory Visit
Admission: RE | Admit: 2017-12-06 | Discharge: 2017-12-06 | Disposition: A | Discharge status: Home / Self Care | Payer: Medicare Other | Source: Ambulatory Visit | Attending: Family Medicine | Admitting: Family Medicine

## 2017-12-06 ENCOUNTER — Other Ambulatory Visit: Payer: Self-pay | Admitting: *Deleted

## 2017-12-06 DIAGNOSIS — R300 Dysuria: Secondary | ICD-10-CM | POA: Insufficient documentation

## 2017-12-06 LAB — URINALYSIS, COMPLETE (UACMP) WITH MICROSCOPIC
BILIRUBIN URINE: NEGATIVE
Bacteria, UA: NONE SEEN
Glucose, UA: NEGATIVE mg/dL
HGB URINE DIPSTICK: NEGATIVE
Ketones, ur: NEGATIVE mg/dL
LEUKOCYTES UA: NEGATIVE
NITRITE: NEGATIVE
PH: 7 (ref 5.0–8.0)
Protein, ur: NEGATIVE mg/dL
SPECIFIC GRAVITY, URINE: 1.008 (ref 1.005–1.030)

## 2017-12-06 NOTE — Patient Outreach (Signed)
[name]) Care Management  [facility] Social Work  12/06/2017  Hershal CoriaDennis R Portlock 04-17-1956 [id number]  Subjective:  Patient is a 62 year old male, [facility] following a hospital stay for acute respiratory failure. Patient discharged from the hospital on 11/29/17 and was transitioned to rehab. Per patient, he  plan t return to [place] with his son until he finds a place of his own. Per patient, he is trying to find a comfortable place within his budget. Patient  has completed an application for [facility] and a few other places. He stated that the discharge planner, has brought a list of housing options for his review as well. Care planning meeting is scheduled for Tuesday. Per patient, he is working on endurance in PT and OT. Patient's goal is to be safe and as independent as he can be.   Objective:   Encounter Medications:  Outpatient Encounter Medications as of 12/06/2017  Medication Sig Note  . Albuterol Sulfate (PROAIR RESPICLICK) 108 (90 Base) MCG/ACT AEPB Inhale 2 puffs into the lungs every 4 (four) hours as needed (for shortness of breath). Reported on 12/10/2015   . furosemide (LASIX) 20 MG tablet Take 1 tablet (20 mg total) by mouth daily.   Marland Kitchen. gabapentin (NEURONTIN) 600 MG tablet Take 0.5 tablets (300 mg total) by mouth at bedtime.   Marland Kitchen. ipratropium-albuterol (DUONEB) 0.5-2.5 (3) MG/3ML SOLN Take 3 mLs by nebulization 4 (four) times daily as needed (for shortness of breath).   . mometasone-formoterol (DULERA) 200-5 MCG/ACT AERO Inhale 2 puffs into the lungs 2 (two) times daily.   . pantoprazole (PROTONIX) 40 MG tablet Take 1 tablet (40 mg total) by mouth daily at 12 noon.   . potassium chloride (K-DUR,KLOR-CON) 10 MEQ tablet Take 10 mEq by mouth daily.    . predniSONE (DELTASONE) 10 MG tablet Take 4 tabs for 3 days, then 3 tabs for 3 days, then 2 tabs for 3 days, then 1 tab for 3 days, then 1/2 tab for 4 days.   Marland Kitchen. rOPINIRole  (REQUIP) 0.25 MG tablet TAKE 1 TABLET BY MOUTH TWICE DAILY FOR CRAMPS 11/23/2017: LF 07/24/17 #90d  . Spacer/Aero-Holding Chambers (OPTICHAMBER ADVANTAGE-LG MASK) MISC Use as directed with inhaler diag  j44.1   . theophylline (UNIPHYL) 400 MG 24 hr tablet Take 400 mg by mouth daily.  11/23/2017: LF 10/10/17 #90  . tiotropium (SPIRIVA HANDIHALER) 18 MCG inhalation capsule Place 1 capsule (18 mcg total) into inhaler and inhale daily.    No facility-administered encounter medications on file as of 12/06/2017.     Functional Status:  In your present state of health, do you have any difficulty performing the following activities: 11/23/2017 11/23/2017  Hearing? - N  Vision? N N  Difficulty concentrating or making decisions? N N  Walking or climbing stairs? N -  Dressing or bathing? N N  Doing errands, shopping? N -  Some recent data might be hidden    Fall/Depression Screening:  PHQ 2/9 Scores 10/11/2017 10/05/2017 09/26/2017 04/12/2016 03/29/2016 03/02/2016 02/02/2016  PHQ - 2 Score 2 2 2 2 2 2 2   PHQ- 9 Score 6 7 7 9 7 8 8     Assessment: Patient very talkative and engaging. He would like his own apartment however will return back to [place]  to his son's home until he can find a place of his own. Patient currently working on endurance in PT and OT. Active participation emphasized.  Per Marylene LandAngela( discharge planner)  patient will discharge on 12/18/17. This Child psychotherapist emphasized the importance of discussing discharge needs with the discharge planner.  Plan: This social worker to continue to follow patient while in rehab.    Adriana Reams [facility] Care Management [phone]

## 2017-12-07 LAB — URINE CULTURE

## 2017-12-11 DIAGNOSIS — R600 Localized edema: Secondary | ICD-10-CM | POA: Diagnosis not present

## 2017-12-13 ENCOUNTER — Other Ambulatory Visit: Payer: Self-pay | Admitting: Internal Medicine

## 2017-12-14 ENCOUNTER — Telehealth: Payer: Self-pay

## 2017-12-14 NOTE — Telephone Encounter (Signed)
CMN is signed and put in folder. 

## 2017-12-17 ENCOUNTER — Other Ambulatory Visit: Payer: Self-pay | Admitting: *Deleted

## 2017-12-17 NOTE — Patient Outreach (Signed)
  [name]) Care Management  12/17/2017  Dustin [name] Jul 31, 1956 [id number]   This social worker visited patient today at [facility] to discuss discharge plans scheduled as a phone call in error.  Per  patient he has an appointment with his new Pulmonary doctor (Dr. Sung AmabileSimonds)  on 12/18/17. The rehab will provide transport.  Per patient, he will discharge home on 12/19/17 [place] until he identifies another place to live. Per patient, he has submitted a application for  [facility](Currently 12th on the list), patient has a list of several other options that he plans to explore and  submit applications to once he discharges from rehab. Patient will discharge with HH. Per patient his endurance and stability has improved and he feels ready to return home.   PLAN: This Child psychotherapistsocial worker will inform the [name] and RNCM of patient's discharge for transition of care. Discharge date confirmed with discharge planner Mamie Leversngela King for 12/19/17.   Dustin ReamsChrystal Karley Pho, [name] [facility] Care Management [phone]

## 2017-12-18 ENCOUNTER — Encounter: Payer: Self-pay | Admitting: Pulmonary Disease

## 2017-12-18 ENCOUNTER — Ambulatory Visit: Payer: Medicare Other | Admitting: Pulmonary Disease

## 2017-12-18 VITALS — BP 124/88 | HR 106 | Ht 71.0 in | Wt 220.0 lb

## 2017-12-18 DIAGNOSIS — J9621 Acute and chronic respiratory failure with hypoxia: Secondary | ICD-10-CM | POA: Diagnosis not present

## 2017-12-18 DIAGNOSIS — I5189 Other ill-defined heart diseases: Secondary | ICD-10-CM | POA: Diagnosis not present

## 2017-12-18 DIAGNOSIS — J449 Chronic obstructive pulmonary disease, unspecified: Secondary | ICD-10-CM | POA: Diagnosis not present

## 2017-12-18 DIAGNOSIS — J9602 Acute respiratory failure with hypercapnia: Secondary | ICD-10-CM | POA: Diagnosis not present

## 2017-12-18 DIAGNOSIS — F17200 Nicotine dependence, unspecified, uncomplicated: Secondary | ICD-10-CM | POA: Diagnosis not present

## 2017-12-18 DIAGNOSIS — J9611 Chronic respiratory failure with hypoxia: Secondary | ICD-10-CM | POA: Diagnosis not present

## 2017-12-18 DIAGNOSIS — J42 Unspecified chronic bronchitis: Secondary | ICD-10-CM

## 2017-12-18 DIAGNOSIS — J439 Emphysema, unspecified: Secondary | ICD-10-CM

## 2017-12-18 DIAGNOSIS — J984 Other disorders of lung: Secondary | ICD-10-CM | POA: Diagnosis not present

## 2017-12-18 DIAGNOSIS — J9622 Acute and chronic respiratory failure with hypercapnia: Secondary | ICD-10-CM | POA: Diagnosis not present

## 2017-12-18 DIAGNOSIS — J9601 Acute respiratory failure with hypoxia: Secondary | ICD-10-CM | POA: Diagnosis not present
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
## 2017-12-18 NOTE — Addendum Note (Signed)
Addended by: Wenda OverlandLAND, Jaymie Misch M on: 12/18/2017 11:40 PM   Modules accepted: Orders

## 2017-12-18 NOTE — Patient Instructions (Signed)
Maintenance medications: Dulera (or equivalent) twice a day Spiriva (or Incruse) daily Theophylline 400 mg daily DuoNeb nebulized 4 times a day Oxygen therapy titrated to maintain oxygen saturation greater than 90%  Rescue medications: Albuterol inhaler (ProAir air or Ventolin HFA)-2 actuations as needed If albuterol inhaler fails to provide relief, may take an extra treatment of DuoNeb  We discussed smoking cessation.  It is absolutely necessary to quit smoking altogether.  If you cannot live without nicotine, I suggested an alternative mode of delivery (patches, gum, lozenges, toothpicks)  Follow-up in 3 months with chest x-ray prior to that visit

## 2017-12-19 ENCOUNTER — Other Ambulatory Visit: Payer: Self-pay | Admitting: *Deleted

## 2017-12-19 NOTE — Patient Outreach (Signed)
[place] [facility]) Care Management  12/19/2017  KHAMBREL AMSDEN 1956/09/12 [id number]  Dustin [name] is an 62 y.o.  gentleman with past medical history which includes COPD (Gold IV Classificaton), asthma, hypertension, anxiety and depression, and GERD who has had 4 inpatient hospital admissions in the last 6 months, all for respiratory ailments. Most recently, Dustin [name] hospitalized from 11/22/17-11/29/17 and was intubated 03/07 and extubated 03/10 during that hospitalization.   I reached out to Dustin [name] by phone today for transition of care services in anticipation of his discharge from SNF (rehab) to home. I am familiar with Dustin [name] having assisted with case management services several times over the last 4 years. At the time of this writing, Dustin [name] is still at the SNF but anticipating discharge today. He already has his paperwork and discharge instructions.   Medications - Dustin [name] has exceptionally good knowledge of his medications and has since I've known him. He is awaiting work from his primary care provider office re: the outcome of patient assistance applications made on his behalf for his inhalers.   Provider Follow Up - Dustin [name] saw Dr. Merton [name] yesterday in the office. Dr. Alva [name] will now be Mr. [name]'s pulmonary care provider. Dustin [name] says he is scheduled to see his primary care doctor but didn't have the date in front of him.   Home Health Follow Up - Dustin [name] is to have home health services resumed upon discharge. He has been active with [name] for some time and anticipates a call from them likely by tomorrow.   [place] Dustin [name] is working with [name] [name] ([place] [employer]) re: long term housing needs. Dustin [name] tells me today that he has 2 applications and other paperwork at home he has been working on for apartments in the [place] area. In the interim, he will be staying with his son and  daughter in law in [place].  Plan: I will follow up with Dustin [name] by phone next week and at least weekly over the next 30 days to ensure that his transition of care needs are met.   Dustin [name] will reach out to his home health nurse if he has not heard from her by tomorrow afternoon.   THN CM Care Plan Problem One     Most Recent Value  Care Plan Problem One  Knowledge Deficits related to changes in pulmonary plan of care  Role Documenting the Problem One  Care Management Coordinator  Care Plan for Problem One  Active  THN Long Term Goal   Over the next 31 days, patient will verbalize and demonstrate knowledge of changes to plan of care for management of pulmonary disease as outlined by new pulmonnary provider  [facility] Long Term Goal Start Date  12/19/17  Interventions for Problem One Long Term Goal  reviewed dischargeh plan from SNF and changes made to plan of care as outlined by new pulmonary provider in yesterday's office visit  THN CM Short Term Goal #1   Over the next 30 days, patient will verbalize receipt of/re-initaition of home health nursing services as ordered  [facility] CM Short Term Goal #1 Start Date  12/19/17  Interventions for Short Term Goal #1  reviewed plans for resumption of HHRN for pulmonary/COPD plan  THN CM Short Term Goal #2   Over the next 30 days, patient will take all medications including inhalers as prescribed   THN CM Short Term Goal #  2 Start Date  12/19/17  Interventions for Short Term Goal #2  medications reviewed with patient    [facility] CM Care Plan Problem Two     Most Recent Value  Care Plan Problem Two  Financial Barriers related to Medication Mangaement  Role Documenting the Problem Two  Care Management Coordinator  Care Plan for Problem Two  Active  Interventions for Problem Two Long Term Goal   reviewed current medicaitons and PCP office assistance with patient assistance applications  THN Long Term Goal  Over the next 31 days, patient will verbalize  understanding of assistance available for inhalers ordered   THN Long Term Goal Start Date  12/19/17  THN CM Short Term Goal #1   Over the next 7 days, patient will verbalize receipt of status of patient assistance applicaitons from PCP office  [facility] CM Short Term Goal #1 Start Date  12/19/17  Interventions for Short Term Goal #2   advised patient to follow up with PCP re: patient assistance applications      [facility] Care Management  [phone]

## 2017-12-24 ENCOUNTER — Other Ambulatory Visit: Payer: Self-pay | Admitting: *Deleted

## 2017-12-24 NOTE — Patient Outreach (Signed)
[facility]) Care Management  12/24/2017  Dustin [name] 1956-05-19 [id number]   Transition of Care Outreach Call #2:  I reached out to Dustin [name] again today to follow up on care coordination and transition of care needs related to his most recent hospitalization for COPD/Respiratory Failure.   COPD Management - Dustin [name] reports today that he is feeling much better and his breathing seems "under control". Dustin [name] denies new or worsened symptoms related to his COPD and even says he feels more energetic. He is taking his medications as prescribed. His home health nurse is coming by for visits and he says assessing his pulmonary status routinely.   Medications - Dustin [name] is awaiting work from his primary care provider office re: the outcome of patient assistance applications made on his behalf for his inhalers.   Home Health Follow Up - Dustin [name] has been active with [facility] for some time and is currently being followed for [facility] and HHPT services.    [place] Dustin [name] is working with [name] [name] ([place] [employer]) re: long term housing needs.   Plan: In my absence next week, my colleague will follow up with Dustin [name] by phone next week for ongoing transition of care assessment and support.   THN CM Care Plan Problem One     Most Recent Value  Care Plan Problem One  Knowledge Deficits related to changes in pulmonary plan of care  Role Documenting the Problem One  Care Management Coordinator  Care Plan for Problem One  Active  THN Long Term Goal   Over the next 31 days, patient will verbalize and demonstrate knowledge of changes to plan of care for management of pulmonary disease as outlined by new pulmonnary provider  [facility] Long Term Goal Start Date  12/19/17  Interventions for Problem One Long Term Goal  reviewed ongoing work of long term COPD management plan  THN CM Short Term Goal #1   Over the next 30 days,  patient will verbalize receipt of/re-initaition of home health nursing services as ordered  [facility] CM Short Term Goal #1 Start Date  12/19/17  [facility] CM Short Term Goal #1 Met Date  12/24/17  Interventions for Short Term Goal #1  reviewed current HHRN visit schedule  THN CM Short Term Goal #2   Over the next 30 days, patient will take all medications including inhalers as prescribed   THN CM Short Term Goal #2 Start Date  12/19/17  Interventions for Short Term Goal #2  discussed medicaitons and adherence    THN CM Care Plan Problem Two     Most Recent Value  Care Plan Problem Two  Financial Barriers related to Medication Mangaement  Role Documenting the Problem Two  Care Management Coordinator  Care Plan for Problem Two  Active  Interventions for Problem Two Long Term Goal   confirmed with patient that he has completed requested patient assitance paperwork and submitted it to his PCP office  THN Long Term Goal  Over the next 31 days, patient will verbalize understanding of assistance available for inhalers ordered   THN Long Term Goal Start Date  12/19/17  [facility] CM Short Term Goal #1   Over the next 7 days, patient will verbalize receipt of status of patient assistance applicaitons from PCP office  [facility] CM Short Term Goal #1 Start Date  12/19/17  Interventions for Short Term Goal #2   advised patient to contact  PCP officei by week's end if he has not heard from them      [place] Management  [phone]

## 2017-12-26 ENCOUNTER — Ambulatory Visit: Payer: Self-pay | Admitting: Internal Medicine

## 2017-12-26 ENCOUNTER — Other Ambulatory Visit
Admission: RE | Admit: 2017-12-26 | Discharge: 2017-12-26 | Disposition: A | Discharge status: Home / Self Care | Payer: Medicare Other | Source: Ambulatory Visit | Attending: Family Medicine | Admitting: Family Medicine

## 2017-12-26 DIAGNOSIS — D72829 Elevated white blood cell count, unspecified: Secondary | ICD-10-CM | POA: Insufficient documentation

## 2017-12-26 LAB — CBC
HCT: 41.5 % (ref 40.0–52.0)
Hemoglobin: 13.8 g/dL (ref 13.0–18.0)
MCH: 32.1 pg (ref 26.0–34.0)
MCHC: 33.3 g/dL (ref 32.0–36.0)
MCV: 96.4 fL (ref 80.0–100.0)
PLATELETS: 176 10*3/uL (ref 150–440)
RBC: 4.31 MIL/uL — ABNORMAL LOW (ref 4.40–5.90)
RDW: 14.1 % (ref 11.5–14.5)
WBC: 6.2 10*3/uL (ref 3.8–10.6)

## 2017-12-27 ENCOUNTER — Other Ambulatory Visit: Payer: Self-pay | Admitting: *Deleted

## 2017-12-28 NOTE — Patient Outreach (Signed)
[name](THN) Care Management  12/28/2017  Dustin CoriaDennis R [name] 07-02-1956 [id number]   CSW called to follow-up on patient since [facility] to his son & daughter-in-law's home in [place]. Patient reports that he is still on the [facility] list [facility]. CSW called & spoke with Okey Dupreose who confirmed that he is 12th on the waitlist. Patient states that he also submitted an application to [facility] in [place] but has yet to hear if he is on the waitlist or not. CSW left a Engineer, technical salesvoicemail for [employer], Radio broadcast assistantproperty manager [address] phone #: [phone]. CSW will await call back & follow-up with patient in 2 weeks.    Dustin MaxinKelly Karishma Unrein, [name] [name]  Clinical Social Worker cell #: [phone]

## 2017-12-31 ENCOUNTER — Other Ambulatory Visit: Payer: Self-pay

## 2017-12-31 ENCOUNTER — Encounter: Payer: Self-pay | Admitting: Pulmonary Disease

## 2017-12-31 MED ORDER — PREDNISONE 10 MG PO TABS: ORAL_TABLET | ORAL | 0 refills | Status: DC

## 2017-12-31 MED ORDER — LEVOFLOXACIN 500 MG PO TABS: 500.0000 mg | ORAL_TABLET | Freq: Every day | ORAL | 0 refills | Status: DC

## 2017-12-31 NOTE — Telephone Encounter (Signed)
Home health nurse [phone] that pt is coughing,wheezingand green mucus as per dr Beverely Risenfozia khan we send levaquin and prednisone and reschedlue his appt with dsk and also advised pt that we send med and reschedule his appt for tomorrow

## 2018-01-01 ENCOUNTER — Ambulatory Visit: Payer: Self-pay | Admitting: Internal Medicine

## 2018-01-01 ENCOUNTER — Other Ambulatory Visit: Payer: Self-pay | Admitting: Internal Medicine

## 2018-01-02 ENCOUNTER — Telehealth: Payer: Self-pay | Admitting: *Deleted

## 2018-01-02 ENCOUNTER — Ambulatory Visit: Payer: Self-pay | Admitting: *Deleted

## 2018-01-02 NOTE — Telephone Encounter (Signed)
Patient called and states he has an appt with his PCP at the end of this month after he was discharge from the hospital and rehab. Patient states the PCP prescribed him 2 medications w/o  seeing him and the patient states he is unsure about that. Patient is wondering if Dr. Sung AmabileSimonds can see him before the end for the month. Patient is requesting a call back. His contact # is [phone]. Please advise. Thank you

## 2018-01-03 NOTE — Telephone Encounter (Signed)
Spoke with pt and he states that [facility] and stated he was wheezing bad. She called his PCP and they placed him on Levaquin x 10days and Prednisone x 10 days and is on the 4th day. States HHRN came back yesterday and states the wheezing sounds better. Pt states he feels fine in the mornings but then as the days goes on he starts to get more SOB and more fatigued. States he has a cough but only prod at times with yellow mucus. Informed pt to start using his flutter valve since he has not been. He is concerned that PCP didn't see him yet and prescribed medications and wanted to make sure that you didn't need to see him due to this. Please advise if you would like to see him about a week after finishing the medications. He seen you 12/18/17 and is due to f/u in 3 months. Please advise.

## 2018-01-03 NOTE — Telephone Encounter (Signed)
Per DS, Pt is to come in 01/10/18 @ 1:30pm. Pt informed. Nothing further needed.

## 2018-01-07 ENCOUNTER — Ambulatory Visit: Payer: Self-pay | Admitting: Internal Medicine

## 2018-01-10 ENCOUNTER — Other Ambulatory Visit: Payer: Self-pay | Admitting: *Deleted

## 2018-01-10 ENCOUNTER — Encounter: Payer: Self-pay | Admitting: Pulmonary Disease

## 2018-01-10 ENCOUNTER — Ambulatory Visit: Payer: Medicare Other | Admitting: Pulmonary Disease

## 2018-01-10 VITALS — BP 132/78 | HR 114 | Ht 71.0 in | Wt 222.0 lb

## 2018-01-10 DIAGNOSIS — J9611 Chronic respiratory failure with hypoxia: Secondary | ICD-10-CM

## 2018-01-10 DIAGNOSIS — J449 Chronic obstructive pulmonary disease, unspecified: Secondary | ICD-10-CM

## 2018-01-10 DIAGNOSIS — J441 Chronic obstructive pulmonary disease with (acute) exacerbation: Secondary | ICD-10-CM

## 2018-01-10 MED ORDER — METHYLPREDNISOLONE ACETATE 80 MG/ML IJ SUSP: 120.0000 mg | Freq: Once | INTRAMUSCULAR | Status: DC

## 2018-01-10 MED ORDER — LEVOFLOXACIN 500 MG PO TABS: 500.0000 mg | ORAL_TABLET | Freq: Every day | ORAL | 0 refills | Status: DC

## 2018-01-10 MED ORDER — METHYLPREDNISOLONE ACETATE 80 MG/ML IJ SUSP
120.0000 mg | Freq: Once | INTRAMUSCULAR | Status: AC
  Administered 2018-01-10: 120 mg via INTRAMUSCULAR

## 2018-01-10 NOTE — Patient Outreach (Signed)
[name]: Dustin [name](THN) Care Management  01/10/2018  Dustin [name] R [name] 09/16/1956 [id number]  Continued transition of care follow up today with [name] by phone.   COPD Management - [name] denies new or worsening symptoms today related to his chronic respiratory disease. He is taking his medications as prescribed. His home health nurse is coming by for visits and he says assessing his pulmonary status routinely. [name] transitioned to and established pulmonary care with Dr. Billy Fischeravid [name] on 12/18/17 whom he saw in the office yesterday for cough and increased shortness of breath. [name] is being treated with oral steroids and antibiotics. He verbalized understanding of the treatment plan for his acute illness.   Home Health Follow Up- [name] has been active with [facility] for some time and is currently being [facility] services.  He reports he was [facility].   Long Term Housing Needs- [name] is working with Dustin MaxinKelly Harrison [name] re: short term and long term housing needs. Today, [name] tells me that his current situation (living at the home of his son and daughter in law) is not working out and he feels he needs to find housing very soon. He is willing to stay in a hotel short term while he continues to look for long term housing. [place] but [place] the [place] area as he is familiar and feels comfortable there.   I reached out to Ms. Dustin [name] today to discuss the status of [name]'s housing needs and request her expertise/assistance.   I have reached out to the following local hotels/motels to inquire on [name]'s behalf about weekly hotel/motel rates. Following are the 3 lowest priced available:   [address], [place], [name] = $280/week [facility] [place], [name] = $339/week [employer] [place], [place], [name] = $400/week  Plan: [name] will  work with [name] Dustin [name] re: housing needs. I will follow up re: this need by the first of next week.   [name] will continue antibiotics and steroids as prescribed.   THN CM Care Plan Problem One     Most Recent Value  Care Plan Problem One  Knowledge Deficits related to changes in pulmonary plan of care  Role Documenting the Problem One  Care Management Coordinator  Care Plan for Problem One  Active  THN Long Term Goal   Over the next 31 days, patient will verbalize and demonstrate knowledge of changes to plan of care for management of pulmonary disease as outlined by new pulmonnary provider  [facility] Long Term Goal Start Date  12/19/17  Interventions for Problem One Long Term Goal  reviewed plan prescribed by pulmonary provider for acute COPD flare  [facility]N CM Short Term Goal #2   Over the next 30 days, patient will take all medications including inhalers as prescribed   [facility]N CM Short Term Goal #2 Start Date  12/19/17  Interventions for Short Term Goal #2  medications reviewed    [facility] CM Care Plan Problem Three     Most Recent Value  Care Plan Problem Three  Long Term Housing Needs  Role Documenting the Problem Three  Care Management Coordinator  Care Plan for Problem Three  Active  THN Long Term Goal   Over the next 31 days, patient will verbalize understanding of options available for short and long term housing solutions  THN Long Term Goal Start Date  01/10/18  Interventions for Problem Three Long Term Goal  [name] consult,  outreach to community resources  [facility] CM Short Term Goal #1   Over the next 7 days, patient will work with [name] to review housing options      Dustin [name] [facility] Dustin [name] Care Management  [phone]

## 2018-01-10 NOTE — Progress Notes (Signed)
PULMONARY OFFICE FOLLOW-UP NOTE  Requesting MD/Service: Self Date of initial consultation: 12/18/17 Reason for consultation: severe COPD, recent intubation for AECOPD  PT PROFILE: 62 y.o. male smoker (up to 2 PPD previously X 40+ yrs, now occasional, none since recent hospitalization) hospitalized 03/07-03/14/19 with ventilator dependent respiratory failure due to AECOPD, PNA. Respiratory virus panel was negative for influenza and positive for rhinovirus. Intubated 03/07 and extubated 03/10. Has prior history of O2 dependent COPD (baseline 2 LPM [place]).   DATA: 06/11/17 CT chest: very severe emphysema. Nonspecific RUL irregular opacity. 9 mm right lower lobe nodule is stable dating back to 11/30/2015 11/23/17 Echocardiogram: LVEF 50-55%, grade I diastolic dysfunction. Mild RV hypokinesis of the apex, RVSP est 45 mmHg  SUBJ:  This is an acute care visit.  I initially evaluated him on 12/18/2017.  He was at his baseline at that time.  He admits to smoking occasionally since that time.  He reports significant stressors in the home which is the reason why he continues to smoke.  His home health nurse heard increased rhonchi and contacted his primary care physician recently.  He could not get him to be seen but was prescribed levofloxacin and prednisone.  He has completed 10 days of levofloxacin and is completing a prednisone taper.  However, he continues to have increased shortness of breath and anxiety.  He has cough productive of yellow to green mucus.  He denies fever, hemoptysis.  He notes increased chest tightness and mild increase in lower extremity edema.  He is using his nebulized bronchodilators 3-4 times per day.   Vitals:   01/10/18 1324 01/10/18 1330  BP:  132/78  Pulse:  (!) 114  SpO2:  91%  Weight: 222 lb (100.7 kg)   Height: 5\' 11"  (1.803 m)   2 LPM [place]  EXAM:  Gen: No overt distress at rest, in wheelchair HEENT: NCAT, sclera white, oropharynx normal Neck: No JVD noted Lungs:  Breath sounds markedly diminished with diffuse scattered wheezes Cardiovascular: Regular, no murmurs noted, tachycardia Abdomen: Centripetal obesity, NABS Ext: 1-2+ symmetric ankle and pedal edema Neuro: No focal deficits  DATA:   BMP Latest Ref Rng & Units 11/29/2017 11/28/2017 11/27/2017  Glucose 65 - 99 mg/dL 94 91 161(W128(H)  BUN 6 - 20 mg/dL 96(E40(H) 45(W46(H) 09(W40(H)  Creatinine 0.61 - 1.24 mg/dL 1.191.22 1.47(W1.37(H) 2.951.23  Sodium 135 - 145 mmol/L 139 140 138  Potassium 3.5 - 5.1 mmol/L 3.9 3.9 4.6  Chloride 101 - 111 mmol/L 102 98(L) 101  CO2 22 - 32 mmol/L 28 31 27   Calcium 8.9 - 10.3 mg/dL 6.2(Z8.4(L) 8.9 3.0(Q8.7(L)    CBC Latest Ref Rng & Units 12/26/2017 11/29/2017 11/28/2017  WBC 3.8 - 10.6 K/uL 6.2 12.9(H) 16.7(H)  Hemoglobin 13.0 - 18.0 g/dL 65.713.8 84.613.6 96.214.5  Hematocrit 40.0 - 52.0 % 41.5 42.3 43.9  Platelets 150 - 440 K/uL 176 254 303    CXR: No new film  I have personally reviewed all chest radiographs reported above including CXRs and CT chest unless otherwise indicated  IMPRESSION:     ICD-10-CM   1. COPD with acute exacerbation (HCC) J44.1 methylPREDNISolone acetate (DEPO-MEDROL) injection 120 mg  2. COPD, very severe (HCC) J44.9   3. Chronic hypoxemic respiratory failure (HCC) J96.11     PLAN:  An additional 5 days of levofloxacin 500 mg (to complete 15-day course) has been prescribed Depo-Medrol 120 mg administered today He is to complete the previously ordered prednisone taper (2 more days) He is to continue  the rest of his medical regimen as documented below  Cont following maintenance medications: Dulera (or equivalent) twice a day Spiriva (or Incruse) daily Theophylline 400 mg daily DuoNeb nebulized 4 times a day Oxygen therapy titrated to maintain oxygen saturation greater than 90%  Cont following rescue medications: Albuterol inhaler (ProAir air or Ventolin HFA)-2 actuations as needed If albuterol inhaler fails to provide relief, may take an extra treatment of DuoNeb  I  again counseled regarding the need for smoking cessation  Follow-up next week.  If not substantially better by that time, we will discuss possible admission for COPD exacerbation.   Billy Fischer, MD PCCM service Mobile [phone] Pager [phone] 01/10/2018 1:51 PM

## 2018-01-10 NOTE — Patient Instructions (Signed)
5 more days of levofloxacin-prescription ordered and sent to [facility] in [place] Depo-Medrol steroid injection, 120 mg, administered today Complete prednisone as previously ordered Continue the rest of your medications as previously ordered Follow-up Tuesday, 01/15/2018 at 830 or 845 AM.  If not substantially better by then, we will discuss possible admission to hospital

## 2018-01-10 NOTE — Addendum Note (Signed)
Addended by: Meyer CoryAHMAD, MISTY R on: 01/10/2018 02:17 PM   Modules accepted: Orders

## 2018-01-11 ENCOUNTER — Other Ambulatory Visit: Payer: Self-pay | Admitting: *Deleted

## 2018-01-11 NOTE — Patient Outreach (Signed)
[name]N) Care Management  01/11/2018  Dustin CoriaDennis R Kishbaugh 11/22/1955 [id number]   CSW received call from [facility] RNCM, Alisa that patient is anxious to get out of his son's home where he has been staying. CSW called patient to review options, but he was on his way to meet with the apartment manager at [facility] apartments. CSW encouraged patient to call CSW back after meeting with the manager but also forwarded referral to [facility] BSW, Amber to help assist with housing. Patient is hopeful that he will be able to get into [facility] as his home care nurse spoke with the manager there and "put in a good word".   CSW did call & speak with [facility] (Address: [address], [place], [name] [address]  Phone: [phone]) to confirm that they have weekly availability & quoted CSW weekly rate of $190.    [place] MaxinKelly Alsace Dowd, [name] [name]  Clinical Social Worker cell #: [phone]

## 2018-01-15 ENCOUNTER — Other Ambulatory Visit: Payer: Self-pay | Admitting: *Deleted

## 2018-01-15 ENCOUNTER — Encounter: Payer: Self-pay | Admitting: Pulmonary Disease

## 2018-01-15 ENCOUNTER — Other Ambulatory Visit: Payer: Self-pay

## 2018-01-15 ENCOUNTER — Inpatient Hospital Stay
Admission: AD | Admit: 2018-01-15 | Discharge: 2018-01-21 | DRG: 190 | Disposition: A | Discharge status: Home Health | Payer: Medicare Other | Source: Ambulatory Visit | Attending: Internal Medicine | Admitting: Internal Medicine

## 2018-01-15 ENCOUNTER — Ambulatory Visit (INDEPENDENT_AMBULATORY_CARE_PROVIDER_SITE_OTHER): Payer: Medicare Other | Admitting: Pulmonary Disease

## 2018-01-15 ENCOUNTER — Inpatient Hospital Stay: Payer: Medicare Other

## 2018-01-15 VITALS — BP 124/80 | HR 128 | Ht 71.0 in | Wt 218.0 lb

## 2018-01-15 DIAGNOSIS — K219 Gastro-esophageal reflux disease without esophagitis: Secondary | ICD-10-CM | POA: Diagnosis present

## 2018-01-15 DIAGNOSIS — Z79899 Other long term (current) drug therapy: Secondary | ICD-10-CM

## 2018-01-15 DIAGNOSIS — J449 Chronic obstructive pulmonary disease, unspecified: Secondary | ICD-10-CM

## 2018-01-15 DIAGNOSIS — Z9981 Dependence on supplemental oxygen: Secondary | ICD-10-CM | POA: Diagnosis not present

## 2018-01-15 DIAGNOSIS — Z7951 Long term (current) use of inhaled steroids: Secondary | ICD-10-CM | POA: Diagnosis not present

## 2018-01-15 DIAGNOSIS — F17210 Nicotine dependence, cigarettes, uncomplicated: Secondary | ICD-10-CM | POA: Diagnosis present

## 2018-01-15 DIAGNOSIS — I272 Pulmonary hypertension, unspecified: Secondary | ICD-10-CM | POA: Diagnosis not present

## 2018-01-15 DIAGNOSIS — J9611 Chronic respiratory failure with hypoxia: Secondary | ICD-10-CM | POA: Diagnosis present

## 2018-01-15 DIAGNOSIS — R0609 Other forms of dyspnea: Secondary | ICD-10-CM

## 2018-01-15 DIAGNOSIS — J441 Chronic obstructive pulmonary disease with (acute) exacerbation: Secondary | ICD-10-CM

## 2018-01-15 DIAGNOSIS — G629 Polyneuropathy, unspecified: Secondary | ICD-10-CM | POA: Diagnosis not present

## 2018-01-15 DIAGNOSIS — F17200 Nicotine dependence, unspecified, uncomplicated: Secondary | ICD-10-CM

## 2018-01-15 DIAGNOSIS — I5033 Acute on chronic diastolic (congestive) heart failure: Secondary | ICD-10-CM | POA: Diagnosis present

## 2018-01-15 DIAGNOSIS — J9601 Acute respiratory failure with hypoxia: Secondary | ICD-10-CM | POA: Diagnosis not present

## 2018-01-15 DIAGNOSIS — R0602 Shortness of breath: Secondary | ICD-10-CM

## 2018-01-15 DIAGNOSIS — F419 Anxiety disorder, unspecified: Secondary | ICD-10-CM | POA: Diagnosis present

## 2018-01-15 DIAGNOSIS — I110 Hypertensive heart disease with heart failure: Secondary | ICD-10-CM | POA: Diagnosis not present

## 2018-01-15 DIAGNOSIS — Z720 Tobacco use: Secondary | ICD-10-CM | POA: Diagnosis not present

## 2018-01-15 DIAGNOSIS — I5032 Chronic diastolic (congestive) heart failure: Secondary | ICD-10-CM | POA: Diagnosis not present

## 2018-01-15 DIAGNOSIS — R0600 Dyspnea, unspecified: Secondary | ICD-10-CM

## 2018-01-15 DIAGNOSIS — F329 Major depressive disorder, single episode, unspecified: Secondary | ICD-10-CM | POA: Diagnosis present

## 2018-01-15 DIAGNOSIS — J9621 Acute and chronic respiratory failure with hypoxia: Secondary | ICD-10-CM | POA: Diagnosis not present

## 2018-01-15 DIAGNOSIS — I10 Essential (primary) hypertension: Secondary | ICD-10-CM | POA: Diagnosis not present

## 2018-01-15 DIAGNOSIS — Z23 Encounter for immunization: Secondary | ICD-10-CM

## 2018-01-15 LAB — BASIC METABOLIC PANEL
ANION GAP: 7 (ref 5–15)
BUN: 20 mg/dL (ref 6–20)
CHLORIDE: 97 mmol/L — AB (ref 101–111)
CO2: 36 mmol/L — AB (ref 22–32)
CREATININE: 1.15 mg/dL (ref 0.61–1.24)
Calcium: 8.8 mg/dL — ABNORMAL LOW (ref 8.9–10.3)
GFR calc non Af Amer: >60 mL/min (ref >60)
Glucose, Bld: 93 mg/dL (ref 65–99)
POTASSIUM: 3.9 mmol/L (ref 3.5–5.1)
Sodium: 140 mmol/L (ref 135–145)

## 2018-01-15 LAB — CBC WITH DIFFERENTIAL/PLATELET
BASOS ABS: 0 10*3/uL (ref 0–0.1)
BASOS PCT: 0 %
Eosinophils Absolute: 0.1 10*3/uL (ref 0–0.7)
Eosinophils Relative: 1 %
HEMATOCRIT: 44.4 % (ref 40.0–52.0)
HEMOGLOBIN: 14.6 g/dL (ref 13.0–18.0)
LYMPHS PCT: 8 %
Lymphs Abs: 1.1 10*3/uL (ref 1.0–3.6)
MCH: 31.8 pg (ref 26.0–34.0)
MCHC: 32.9 g/dL (ref 32.0–36.0)
MCV: 96.8 fL (ref 80.0–100.0)
Monocytes Absolute: 1 10*3/uL (ref 0.2–1.0)
Monocytes Relative: 7 %
NEUTROS PCT: 84 %
Neutro Abs: 12.1 10*3/uL — ABNORMAL HIGH (ref 1.4–6.5)
Platelets: 173 10*3/uL (ref 150–440)
RBC: 4.58 MIL/uL (ref 4.40–5.90)
RDW: 15 % — AB (ref 11.5–14.5)
WBC: 14.5 10*3/uL — AB (ref 3.8–10.6)

## 2018-01-15 LAB — EXPECTORATED SPUTUM ASSESSMENT W REFEX TO RESP CULTURE: SPECIAL REQUESTS: NORMAL

## 2018-01-15 LAB — HEPATIC FUNCTION PANEL
ALBUMIN: 3.7 g/dL (ref 3.5–5.0)
ALT: 12 U/L — ABNORMAL LOW (ref 17–63)
AST: 17 U/L (ref 15–41)
Alkaline Phosphatase: 52 U/L (ref 38–126)
BILIRUBIN DIRECT: 0.2 mg/dL (ref 0.1–0.5)
BILIRUBIN INDIRECT: 1 mg/dL — AB (ref 0.3–0.9)
Total Bilirubin: 1.2 mg/dL (ref 0.3–1.2)
Total Protein: 6.4 g/dL — ABNORMAL LOW (ref 6.5–8.1)

## 2018-01-15 LAB — TSH: TSH: 0.701 u[IU]/mL (ref 0.350–4.500)

## 2018-01-15 LAB — BRAIN NATRIURETIC PEPTIDE: B Natriuretic Peptide: 113 pg/mL — ABNORMAL HIGH (ref 0.0–100.0)

## 2018-01-15 LAB — EXPECTORATED SPUTUM ASSESSMENT W GRAM STAIN, RFLX TO RESP C

## 2018-01-15 MED ORDER — ACETAMINOPHEN 325 MG PO TABS: 650.0000 mg | ORAL_TABLET | Freq: Four times a day (QID) | ORAL | Status: DC | PRN

## 2018-01-15 MED ORDER — SODIUM CHLORIDE 0.9 % IV SOLN
1.0000 g | INTRAVENOUS | Status: AC
  Administered 2018-01-15 – 2018-01-19 (×5): 1 g via INTRAVENOUS
  Filled 2018-01-15 (×6): qty 10

## 2018-01-15 MED ORDER — PANTOPRAZOLE SODIUM 40 MG PO TBEC
40.0000 mg | DELAYED_RELEASE_TABLET | Freq: Every day | ORAL | Status: DC
  Administered 2018-01-15 – 2018-01-21 (×7): 40 mg via ORAL
  Filled 2018-01-15 (×7): qty 1

## 2018-01-15 MED ORDER — ORAL CARE MOUTH RINSE
15.0000 mL | Freq: Two times a day (BID) | OROMUCOSAL | Status: DC
  Administered 2018-01-15 – 2018-01-21 (×11): 15 mL via OROMUCOSAL

## 2018-01-15 MED ORDER — IPRATROPIUM-ALBUTEROL 0.5-2.5 (3) MG/3ML IN SOLN
3.0000 mL | RESPIRATORY_TRACT | Status: DC
  Administered 2018-01-15 – 2018-01-19 (×23): 3 mL via RESPIRATORY_TRACT
  Filled 2018-01-15 (×22): qty 3

## 2018-01-15 MED ORDER — FUROSEMIDE 10 MG/ML IJ SOLN
40.0000 mg | Freq: Every day | INTRAMUSCULAR | Status: DC
  Administered 2018-01-15 – 2018-01-16 (×2): 40 mg via INTRAVENOUS
  Filled 2018-01-15 (×2): qty 4

## 2018-01-15 MED ORDER — DOCUSATE SODIUM 100 MG PO CAPS
100.0000 mg | ORAL_CAPSULE | Freq: Two times a day (BID) | ORAL | Status: DC
  Administered 2018-01-17 – 2018-01-18 (×2): 100 mg via ORAL
  Filled 2018-01-15 (×11): qty 1

## 2018-01-15 MED ORDER — BACLOFEN 5 MG HALF TABLET
5.0000 mg | ORAL_TABLET | Freq: Once | ORAL | Status: AC
  Administered 2018-01-15: 16:00:00 5 mg via ORAL
  Filled 2018-01-15: qty 1

## 2018-01-15 MED ORDER — ESCITALOPRAM OXALATE 10 MG PO TABS
20.0000 mg | ORAL_TABLET | Freq: Every day | ORAL | Status: DC
  Administered 2018-01-15 – 2018-01-20 (×6): 20 mg via ORAL
  Filled 2018-01-15 (×5): qty 2
  Filled 2018-01-15: qty 1
  Filled 2018-01-15 (×2): qty 2
  Filled 2018-01-15: qty 1
  Filled 2018-01-15: qty 2

## 2018-01-15 MED ORDER — ALPRAZOLAM 1 MG PO TABS
1.0000 mg | ORAL_TABLET | Freq: Three times a day (TID) | ORAL | Status: DC | PRN
  Administered 2018-01-15 – 2018-01-21 (×19): 1 mg via ORAL
  Filled 2018-01-15 (×19): qty 1

## 2018-01-15 MED ORDER — METHYLPREDNISOLONE SODIUM SUCC 125 MG IJ SOLR
60.0000 mg | Freq: Four times a day (QID) | INTRAMUSCULAR | Status: DC
  Administered 2018-01-15 – 2018-01-18 (×12): 60 mg via INTRAVENOUS
  Filled 2018-01-15 (×12): qty 2

## 2018-01-15 MED ORDER — ACETAMINOPHEN 650 MG RE SUPP: 650.0000 mg | Freq: Four times a day (QID) | RECTAL | Status: DC | PRN

## 2018-01-15 MED ORDER — ROPINIROLE HCL 0.25 MG PO TABS
0.2500 mg | ORAL_TABLET | Freq: Two times a day (BID) | ORAL | Status: DC
  Administered 2018-01-15 – 2018-01-21 (×12): 0.25 mg via ORAL
  Filled 2018-01-15 (×14): qty 1

## 2018-01-15 MED ORDER — PNEUMOCOCCAL VAC POLYVALENT 25 MCG/0.5ML IJ INJ
0.5000 mL | INJECTION | INTRAMUSCULAR | Status: AC
  Administered 2018-01-16: 10:00:00 0.5 mL via INTRAMUSCULAR
  Filled 2018-01-15: qty 0.5

## 2018-01-15 MED ORDER — GABAPENTIN 300 MG PO CAPS
300.0000 mg | ORAL_CAPSULE | Freq: Three times a day (TID) | ORAL | Status: DC
  Administered 2018-01-15 – 2018-01-18 (×9): 300 mg via ORAL
  Filled 2018-01-15 (×9): qty 1

## 2018-01-15 MED ORDER — ENOXAPARIN SODIUM 40 MG/0.4ML ~~LOC~~ SOLN
40.0000 mg | Freq: Every day | SUBCUTANEOUS | Status: DC
  Administered 2018-01-15 – 2018-01-20 (×6): 40 mg via SUBCUTANEOUS
  Filled 2018-01-15 (×7): qty 0.4

## 2018-01-15 MED ORDER — POLYETHYLENE GLYCOL 3350 17 G PO PACK: 17.0000 g | PACK | Freq: Every day | ORAL | Status: DC | PRN

## 2018-01-15 MED ORDER — FUROSEMIDE 20 MG PO TABS: 20.0000 mg | ORAL_TABLET | Freq: Every day | ORAL | Status: DC

## 2018-01-15 MED ORDER — POTASSIUM CHLORIDE CRYS ER 10 MEQ PO TBCR: 10.0000 meq | EXTENDED_RELEASE_TABLET | Freq: Every day | ORAL | Status: DC

## 2018-01-15 MED ORDER — ENOXAPARIN SODIUM 40 MG/0.4ML ~~LOC~~ SOLN: 40.0000 mg | SUBCUTANEOUS | Status: DC

## 2018-01-15 MED ORDER — POTASSIUM CHLORIDE CRYS ER 20 MEQ PO TBCR
20.0000 meq | EXTENDED_RELEASE_TABLET | Freq: Every day | ORAL | Status: DC
  Administered 2018-01-16 – 2018-01-18 (×3): 20 meq via ORAL
  Filled 2018-01-15 (×3): qty 1

## 2018-01-15 MED ORDER — THEOPHYLLINE ER 400 MG PO TB24
400.0000 mg | ORAL_TABLET | Freq: Every day | ORAL | Status: DC
  Administered 2018-01-16 – 2018-01-21 (×6): 400 mg via ORAL
  Filled 2018-01-15 (×7): qty 1

## 2018-01-15 MED ORDER — ONDANSETRON HCL 4 MG PO TABS
4.0000 mg | ORAL_TABLET | Freq: Four times a day (QID) | ORAL | Status: DC | PRN
Start: 2018-01-15 — End: 2018-01-21

## 2018-01-15 MED ORDER — SODIUM CHLORIDE 0.9 % IV SOLN
500.0000 mg | Freq: Every day | INTRAVENOUS | Status: DC
  Administered 2018-01-15 – 2018-01-16 (×2): 500 mg via INTRAVENOUS
  Filled 2018-01-15 (×2): qty 500

## 2018-01-15 MED ORDER — ONDANSETRON HCL 4 MG/2ML IJ SOLN: 4.0000 mg | Freq: Four times a day (QID) | INTRAMUSCULAR | Status: DC | PRN

## 2018-01-15 MED ORDER — GABAPENTIN 600 MG PO TABS
300.0000 mg | ORAL_TABLET | Freq: Every day | ORAL | Status: DC
  Administered 2018-01-15 – 2018-01-17 (×3): 300 mg via ORAL
  Filled 2018-01-15 (×4): qty 1

## 2018-01-15 NOTE — Patient Instructions (Signed)
Admit to hospital for COPD exacerbation

## 2018-01-15 NOTE — Consult Note (Signed)
PULMONARY CONSULT NOTE  Date of initial office consultation: 12/18/17 Reason for consultation: severe COPD, recent intubation for AECOPD  PT PROFILE (initial): 62 y.o. male smoker (up to 2 PPD previously X 40+ yrs, now 2-3 cigs/d) hospitalized 03/07-03/14/19 in [place] with ventilator dependent respiratory failure due to AECOPD, PNA. Respiratory virus panel was negative for influenza and positive for rhinovirus. Intubated 03/07 and extubated 03/10. Has prior history of O2 dependent COPD (baseline 2 LPM [place]).   DATA: 06/11/17 CT chest: very severe emphysema. Nonspecific RUL irregular opacity. 9 mm right lower lobe nodule is stable dating back to 11/30/2015 11/23/17 Echocardiogram: LVEF 50-55%, grade I diastolic dysfunction. Mild RV hypokinesis of the apex, RVSP est 45 mmHg   HPI:  Seen in consultation 12/18/17 after hospitalization as documented above.  He saw his primary care provider in the middle of April with increased shortness of breath and wheezing.  He was scribed levofloxacin and prednisone.  I saw him in follow-up 01/10/2018 with persistent symptoms of increased dyspnea, cough, discolored mucus, wheezing, chest tightness, increased LE edema.  At that time, we administered Depo-Medrol and I extended his levofloxacin course another 5 days.  He returned to my office on the day of this consultation with no improvement.  He continues to have disabling exertional dyspnea, cough with purulent sputum.  He denies fever, hemoptysis, chest pain.  Past Medical History:  Diagnosis Date  . Allergy   . Anxiety   . Anxiety   . Asthma   . Chronic diastolic CHF (congestive heart failure) (HCC)    a. echo 07/2013: EF 60-65%, DD, biatrial dilatation, Ao sclerosis, dilated RV, moderate pulmonary HTN, elevated CV and RA pressures; b. patient reported echo at Dr. Milta Deiters office 02/2015 - his office does not have record of him being a pt there c. echo 11/2015: EF 60-65%, Grade 1 DD, mod-severe pulm  pressures  . Chronic respiratory failure (HCC)    a. on 2L via nasal cannula; b. secondary to COPD  . COPD (chronic obstructive pulmonary disease) (HCC)   . Depression   . Depression   . Depression   . Emphysema of lung (HCC)   . GERD (gastroesophageal reflux disease)   . Hypertension   . Personal history of tobacco use, presenting hazards to health 08/17/2015  . Tobacco abuse     Past Surgical History:  Procedure Laterality Date  . ABDOMINAL SURGERY    . ADENOIDECTOMY    . CARPAL TUNNEL RELEASE Right   . corpal tunnel Right   . ELBOW SURGERY Right    repaired tendon  . hemmorhoid N/A   . hemorrh    . NASAL SINUS SURGERY    . NOSE SURGERY    . reflux surgery    . ROTATOR CUFF REPAIR Right   . SHOULDER SURGERY    . TENNIS ELBOW RELEASE/NIRSCHEL PROCEDURE Right   . URETHRA SURGERY     surgery 6 times from age [age] yrs old  . URETHRA SURGERY      MEDICATIONS: I have reviewed all medications and confirmed regimen as documented  Social History   Socioeconomic History  . Marital status: Legally Separated    Spouse name: Not on file  . Number of children: Not on file  . Years of education: Not on file  . Highest education level: Not on file  Occupational History  . Not on file  Social Needs  . Financial resource strain: Not on file  . Food insecurity:    Worry: Not on  file    Inability: Not on file  . Transportation needs:    Medical: Not on file    Non-medical: Not on file  Tobacco Use  . Smoking status: Current Every Day Smoker    Packs/day: 1.50    Years: 40.00    Pack years: 60.00    Types: Cigarettes  . Smokeless tobacco: Never Used  Substance and Sexual Activity  . Alcohol use: No  . Drug use: No  . Sexual activity: Not Currently  Lifestyle  . Physical activity:    Days per week: 0 days    Minutes per session: 0 min  . Stress: Only a little  Relationships  . Social connections:    Talks on phone: Not on file    Gets together: Not on file     Attends religious service: Not on file    Active member of club or organization: Not on file    Attends meetings of clubs or organizations: Not on file    Relationship status: Not on file  . Intimate partner violence:    Fear of current or ex partner: Not on file    Emotionally abused: Not on file    Physically abused: Not on file    Forced sexual activity: Not on file  Other Topics Concern  . Not on file  Social History Narrative   On 2 L chronic home O2.  Lives at home with his son    Family History  Problem Relation Age of Onset  . CAD Father   . Hyperlipidemia Father   . Stroke Father   . Heart disease Father   . Hypertension Mother   . Peripheral Artery Disease Mother   . Rheum arthritis Mother   . Asthma Mother   . Bipolar disorder Mother   . Depression Mother   . Malignant hypertension Mother   . Diabetes Neg Hx     ROS: No fever, myalgias/arthralgias, unexplained weight loss or weight gain No new focal weakness or sensory deficits No otalgia, hearing loss, visual changes, nasal and sinus symptoms, mouth and throat problems No neck pain or adenopathy No abdominal pain, N/V/D, diarrhea, change in bowel pattern No dysuria, change in urinary pattern   Vitals:   01/15/18 1043 01/15/18 1204  BP: 121/88   Pulse: (!) 116   Resp: 20   Temp: 98.1 F (36.7 C)   TempSrc: Oral   SpO2: 97% 97%  [place] O2 2 LPM   EXAM:  Gen: Frail, in wheelchair, no overt respiratory distress, cognition intact HEENT: NCAT, sclera white, oropharynx normal Neck: Supple without LAN, thyromegaly, JVD Lungs: breath sounds moderately diminished with diffuse musical wheezes Cardiovascular: RRR, no murmurs noted Abdomen: Soft, nontender, normal BS Ext: Symmetric ankle and pedal edema without clubbing, cyanosis. Neuro: CNs grossly intact, motor and sensory intact Skin: Limited exam, no lesions noted  DATA:   BMP Latest Ref Rng & Units 11/29/2017 11/28/2017 11/27/2017  Glucose 65 - 99 mg/dL  94 91 161(W)  BUN 6 - 20 mg/dL 96(E) 45(W) 09(W)  Creatinine 0.61 - 1.24 mg/dL 1.19 1.47(W) 2.95  Sodium 135 - 145 mmol/L 139 140 138  Potassium 3.5 - 5.1 mmol/L 3.9 3.9 4.6  Chloride 101 - 111 mmol/L 102 98(L) 101  CO2 22 - 32 mmol/L Calcium 8.9 - 10.3 mg/dL 6.2(Z) 8.9 3.0(Q)    CBC Latest Ref Rng & Units 01/15/2018 12/26/2017 11/29/2017  WBC 3.8 - 10.6 K/uL 14.5(H) 6.2 12.9(H)  Hemoglobin 13.0 -  18.0 g/dL 16.1 09.6 04.5  Hematocrit 40.0 - 52.0 % 44.4 41.5 42.3  Platelets 150 - 440 K/uL 173 176 254    CXR: Chronic changes of COPD.  No acute infiltrates or edema noted  I have personally reviewed all chest radiographs reported above including CXRs and CT chest unless otherwise indicated  IMPRESSION:   1) very advanced baseline COPD 2) chronic hypoxemic respiratory failure 3) former very heavy smoker with continued occasional smoking despite my admonitions on multiple occasions 4) COPD exacerbation refractory to two attempts at outpatient therapy   PLAN:  1) I admitted him from the office for inpatient treatment of COPD exacerbation 2) I have discussed with Dr. Nemiah Commander 3) he should receive the usual treatment with systemic steroids, nebulized bronchodilators, supplemental oxygen, empiric antibiotics. 4) recommend sputum Gram stain and culture as he might be colonized with a resistant organism which would explain his lack of response to outpatient attempts at treating this COPD exacerbation 5) I again counseled him regarding the need for complete abstinence from cigarette smoking 6) pulmonary medicine service will continue to follow him while hospitalized     Billy Fischer, MD PCCM service Mobile [phone] Pager [phone] 01/15/2018 12:41 PM

## 2018-01-15 NOTE — Evaluation (Signed)
Physical Therapy Evaluation Patient Details Name: Dustin [name] MRN: [id number] DOB: [birth year] Today's Date: 01/15/2018   History of Present Illness  Pt is a 62 y.o. male presenting with worsening SOB and productive sputum.  H/o intubation 11/22/17 with extubation 11/25/17 and has been home from rehab for about 3 weeks.  Pt direct admit for COPD exacerbation (failed 2 attempts OP treatment).  PMH includes O2 dependent 2 LPM baseline, CHF, COPD, depression, htn, (+) smoking.  Clinical Impression  Prior to hospital admission, pt was modified independent with ambulation (no AD use; on 2 L home O2).  Pt lives with family but has been looking at other housing options.  Currently pt is modified independent supine to sit; independent with transfers; and SBA ambulating 40 feet with no AD.  Overall pt steady and appearing strong but demonstrating SOB with activity.  Pt's O2 sats 90% on 2 L O2 via nasal cannula at rest beginning of session; 88% on 2 L after ambulating 20 feet; and 80% on 2 L after ambulating another 20 feet.  Pt requiring a few minutes sitting rest break to increase O2 to 92% on 2 L via nasal cannula at end of session; respiratory present end of session.  Pt would benefit from skilled PT to address noted impairments and functional limitations (see below for any additional details).  Upon hospital discharge, recommend pt discharge to home; no further PT needs anticipated upon hospital discharge.    Follow Up Recommendations No PT follow up    Equipment Recommendations  None recommended by PT    Recommendations for Other Services       Precautions / Restrictions Precautions Precautions: Fall Restrictions Weight Bearing Restrictions: No      Mobility  Bed Mobility Overal bed mobility: Modified Independent             General bed mobility comments: Supine to sit with HOB elevated with mild increased effort.  Transfers Overall transfer level: Independent                General transfer comment: steady safe transfers  Ambulation/Gait Ambulation/Gait assistance: Supervision Ambulation Distance (Feet): 40 Feet Assistive device: None   Gait velocity: mildly decreased   General Gait Details: steady ambulation; limited distance d/t SOB and O2 desaturation  Stairs            Wheelchair Mobility    Modified Rankin (Stroke Patients Only)       Balance Overall balance assessment: Needs assistance Sitting-balance support: No upper extremity supported;Feet supported Sitting balance-Leahy Scale: Normal Sitting balance - Comments: steady sitting reaching outside BOS   Standing balance support: No upper extremity supported Standing balance-Leahy Scale: Good Standing balance comment: steady standing reaching within BOS                             Pertinent Vitals/Pain Pain Assessment: No/denies pain  HR 115-122 bpm during session.    Home Living Family/patient expects to be discharged to:: Private residence Living Arrangements: Children(Pt's son and daughter-in-law) Available Help at Discharge: Family Type of Home: House Home Access: Stairs to enter Entrance Stairs-Rails: Doctor, general practice of Steps: 3 Home Layout: One level Home Equipment: Environmental consultant - 2 wheels;Shower seat;Grab bars - tub/shower;Walker - 4 wheels Additional Comments: Pt stays in the living room at his son's house.     Prior Function Level of Independence: Independent with assistive device(s)         Comments: 2  L home O2.  Pt reports no recent falls.     Hand Dominance        Extremity/Trunk Assessment   Upper Extremity Assessment Upper Extremity Assessment: Generalized weakness    Lower Extremity Assessment Lower Extremity Assessment: Generalized weakness    Cervical / Trunk Assessment Cervical / Trunk Assessment: Normal  Communication   Communication: No difficulties  Cognition Arousal/Alertness: Awake/alert Behavior During  Therapy: WFL for tasks assessed/performed Overall Cognitive Status: Within Functional Limits for tasks assessed                                        General Comments General comments (skin integrity, edema, etc.): Pt resting in bed upon PT arrival.  Nursing cleared pt for participation in physical therapy.  Pt agreeable to PT session.    Exercises     Assessment/Plan    PT Assessment Patient needs continued PT services  PT Problem List Decreased strength;Decreased activity tolerance;Cardiopulmonary status limiting activity       PT Treatment Interventions Functional mobility training;Therapeutic activities;Therapeutic exercise;Patient/family education    PT Goals (Current goals can be found in the Care Plan section)  Acute Rehab PT Goals Patient Stated Goal: to improve breathing with mobility PT Goal Formulation: With patient Time For Goal Achievement: 01/29/18 Potential to Achieve Goals: Fair    Frequency Min 2X/week   Barriers to discharge        Co-evaluation               AM-PAC PT "6 Clicks" Daily Activity  Outcome Measure Difficulty turning over in bed (including adjusting bedclothes, sheets and blankets)?: A Little Difficulty moving from lying on back to sitting on the side of the bed? : A Little Difficulty sitting down on and standing up from a chair with arms (e.g., wheelchair, bedside commode, etc,.)?: A Little Help needed moving to and from a bed to chair (including a wheelchair)?: None Help needed walking in hospital room?: A Little Help needed climbing 3-5 steps with a railing? : A Little 6 Click Score: 19    End of Session Equipment Utilized During Treatment: Gait belt Activity Tolerance: Other (comment)(Limited d/t SOB and O2 desaturation with activity) Patient left: in chair;with call bell/phone within reach;Other (comment)(respiratory present; no chair alarm required--pt fall risk score of 8) Nurse Communication: Mobility  status;Precautions PT Visit Diagnosis: Other abnormalities of gait and mobility (R26.89)    Time: 1540-1600 PT Time Calculation (min) (ACUTE ONLY): 20 min   Charges:   PT Evaluation $PT Eval Low Complexity: 1 Low     PT G CodesHendricks Limes, PT 01/15/18, 4:51 PM [phone]

## 2018-01-15 NOTE — Progress Notes (Signed)
PULMONARY OFFICE FOLLOW-UP NOTE  Requesting MD/Service: Self Date of initial consultation: 12/18/17 Reason for consultation: severe COPD, recent intubation for AECOPD  PT PROFILE: 63 y.o. male smoker (up to 2 PPD previously X 40+ yrs, now occasional, none since recent hospitalization) hospitalized 03/07-03/14/19 with ventilator dependent respiratory failure due to AECOPD, PNA. Respiratory virus panel was negative for influenza and positive for rhinovirus. Intubated 03/07 and extubated 03/10. Has prior history of O2 dependent COPD (baseline 2 LPM [place]).   DATA: 06/11/17 CT chest: very severe emphysema. Nonspecific RUL irregular opacity. 9 mm right lower lobe nodule is stable dating back to 11/30/2015 11/23/17 Echocardiogram: LVEF 50-55%, grade I diastolic dysfunction. Mild RV hypokinesis of the apex, RVSP est 45 mmHg  SUBJ:  Seen 04/25 with AECOPD and prominent wheezing. Received depomedrol and 5 more days of levofloxacin. Returns today for quick recheck  Vitals:   01/15/18 0904 01/15/18 0908  BP:  124/80  Pulse:  (!) 128  SpO2:  93%  Weight: 218 lb (98.9 kg)   Height:  (1.803 m)   2 LPM [place]  EXAM:  Gen: No overt distress at rest, in wheelchair HEENT: NCAT, sclera white, oropharynx normal Neck: No JVD noted Lungs: Breath sounds markedly diminished with diffuse scattered wheezes Cardiovascular: Regular, no murmurs noted, tachycardia Abdomen: Centripetal obesity, NABS Ext: 1-2+ symmetric ankle and pedal edema Neuro: No focal deficits  DATA:   BMP Latest Ref Rng & Units 11/29/2017 11/28/2017 11/27/2017  Glucose 65 - 99 mg/dL 94 91 161(W)  BUN 6 - 20 mg/dL 96(E) 45(W) 09(W)  Creatinine 0.61 - 1.24 mg/dL 1.19 1.47(W) 2.95  Sodium 135 - 145 mmol/L 139 140 138  Potassium 3.5 - 5.1 mmol/L 3.9 3.9 4.6  Chloride 101 - 111 mmol/L 102 98(L) 101  CO2 22 - 32 mmol/L Calcium 8.9 - 10.3 mg/dL 6.2(Z) 8.9 3.0(Q)    CBC Latest Ref Rng & Units 12/26/2017 11/29/2017 11/28/2017  WBC  3.8 - 10.6 K/uL 6.2 12.9(H) 16.7(H)  Hemoglobin 13.0 - 18.0 g/dL 65.7 84.6 96.2  Hematocrit 40.0 - 52.0 % 41.5 42.3 43.9  Platelets 150 - 440 K/uL 176 254 303    CXR: No new film  I have personally reviewed all chest radiographs reported above including CXRs and CT chest unless otherwise indicated  IMPRESSION:     ICD-10-CM   1. COPD, very severe (HCC) J44.9   2. Chronic hypoxemic respiratory failure (HCC) J96.11   3. COPD with acute exacerbation (HCC) J44.1     PLAN:  Direct admission was arranged through our office for inpatient treatment of COPD exacerbation which has failed 2 attempts at outpatient treatment.  I have performed inpatient consultation for COPD exacerbation.  Therefore, there is no charge for this office visit.  Billy Fischer, MD PCCM service Mobile [phone] Pager [phone] 01/15/2018 9:34 AM

## 2018-01-15 NOTE — Patient Outreach (Signed)
[name]) Care Management  01/15/2018  Dustin [name] [birth year] [id number]  [name] called me today to say that he showed up for his scheduled follow up appointment with Dr. Sung Amabile, Pulmonology today. Dr. Sung Amabile has admitted [name] to the hospital after he did not respond to outpatient treatment for COPD exacerbation.   Plan: I will notify our hospital liaisons and social work team. [name] is still in need of assistance with long term housing planning and the inpatient social work team at [facility] may have helpful input.    Marja Kays MHA,BSN,RN,CCM [facility] Care Management  [phone]

## 2018-01-15 NOTE — Clinical Social Work Note (Addendum)
Clinical Social Work Assessment  Patient Details  Name: Dustin [name] MRN: [id number] Date of Birth: October 11, 1955  Date of referral:  01/15/18               Reason for consult:  Housing Concerns/Homelessness                Permission sought to share information with:    Permission granted to share information::     Name::        Agency::     Relationship::     Contact Information:     Housing/Transportation Living arrangements for the past 2 months:  Single Family Home Source of Information:  Patient Patient Interpreter Needed:  None Criminal Activity/Legal Involvement Pertinent to Current Situation/Hospitalization:  No - Comment as needed Significant Relationships:  Adult Children Lives with:  Adult Children Do you feel safe going back to the place where you live?  Yes Need for family participation in patient care:  No (Coment)  Care giving concerns: Patient currently lives with son in [place].   Social Worker assessment / plan: Holiday representative (CSW) received housing concerns consult. Social work Theatre manager met with patient alone at bedside. Patient was sitting up in bed, alert and oriented x4. Social work Theatre manager introduced self and explained the role of the [place]. Patient shared he currently lives in [place] with his son and is looking for an apartment. Patient stated his son wants him to follow his rules and that's why he is looking to move out. Patient shared he is currently wait-listed for some apartments. Patient also shared that he receives about $1320 a month from [employer]. Social work Theatre manager presented patient with an Contractor, Secretary/administrator, Department of Orthoptist, [employer] and [employer]. Patient accepted resource list and shared he has tried to contact both housing authorities already. Patient plans to continue to live with son until he can get an apartment after discharge.  CSW and social work Theatre manager will continue to follow up and assist.  Employment status:  Retired Nurse, adult PT Recommendations:  Not assessed at this time Information / Referral to community resources:     Patient/Family's Response to care:  Patient accepted [employer] and will continue to look for an apartment once discharged.  Patient/Family's Understanding of and Emotional Response to Diagnosis, Current Treatment, and Prognosis: Patient was pleasant and thanked social work Theatre manager for her assistance.  Emotional Assessment Appearance:  Appears stated age Attitude/Demeanor/Rapport:    Affect (typically observed):  Accepting, Calm, Pleasant Orientation:  Oriented to Self, Oriented to Place, Oriented to  Time, Oriented to Situation Alcohol / Substance use:  Not Applicable Psych involvement (Current and /or in the community):  No (Comment)  Discharge Needs  Concerns to be addressed:  Homelessness Readmission within the last 30 days:  No Current discharge risk:  Homeless Barriers to Discharge:  Continued Medical Work up   Smith Mince, [place] Work 01/15/2018, 12:08 PM

## 2018-01-15 NOTE — Progress Notes (Addendum)
Pharmacy Antibiotic Note  Dustin [name] is a 62 y.o. male admitted on 01/15/2018 with pneumonia.  Pharmacy has been consulted for Ceftriaxone dosing.  Plan: Will order Ceftriaxone 1 gram IV q24h (patient already has azithromycin ordered)    Temp (24hrs), Avg:98.1 F (36.7 C), Min:98.1 F (36.7 C), Max:98.1 F (36.7 C)  Recent Labs  Lab 01/15/18 1139  WBC 14.5*    CrCl cannot be calculated (Patient's most recent lab result is older than the maximum 21 days allowed.).    Allergies  Allergen Reactions  . Asa [Aspirin] Other (See Comments)    Reaction: swelling of the right side.  . Bee Venom Swelling  . Codeine Other (See Comments)    Reaction: Difficulty breathing  . Ibuprofen Other (See Comments)    Reaction: Swelling of the right side.  . Iodinated Diagnostic Agents Other (See Comments)    Reaction:  Unknown  Other reaction(s): Unknown    Antimicrobials this admission: Azith/CTX 4/30 >>       >>    Dose adjustments this admission:    Microbiology results:   BCx:     UCx:      Sputum:      MRSA PCR:    Thank you for allowing pharmacy to be a part of this patient's care.  Delorise Hunkele A 01/15/2018 12:07 PM

## 2018-01-15 NOTE — H&P (Signed)
[facility] - [place] at [facility]   PATIENT NAME: Dustin [name]    MR#:  [id number]  DATE OF BIRTH:  1955/12/14  DATE OF ADMISSION:  01/15/2018  PRIMARY CARE PHYSICIAN: Lyndon Code, MD   REQUESTING/REFERRING PHYSICIAN: Dr. Billy Fischer  CHIEF COMPLAINT:  No chief complaint on file.   HISTORY OF PRESENT ILLNESS:  Dustin [name]  is a 62 y.o. male with a known history of chronic respiratory failure secondary to COPD on 2 L home oxygen, chronic diastolic heart failure, depression anxiety, neuropathy presents from pulmonary office secondary to worsening shortness of breath and productive sputum. Patient was admitted to [facility], ICU and was intubated about 6 weeks ago and was discharged to rehab.  He has been home from rehab for the last 3 weeks.  Initially he was doing better, but for the last 10 days his breathing started to get worse.  He states as soon as he finishes his steroids he gets these episodes.  Initially he had chest tightness difficulty breathing but last couple of days since he has been started on prednisone taper and Levaquin by his PCP, his cough has become more productive.  Still has significant wheezing on exam.  Went to follow-up with his pulmonary physician today and was noted to be in dyspnea and was sent to the hospital.  Chest x-ray is pending.  He remains on his 2 L O2 at this time.  Denies any fevers or chills.  No nausea vomiting or abdominal pain.  But has noticed increased ankle swelling in the last couple of days.  PAST MEDICAL HISTORY:   Past Medical History:  Diagnosis Date  . Allergy   . Anxiety   . Anxiety   . Asthma   . Chronic diastolic CHF (congestive heart failure) (HCC)    a. echo 07/2013: EF 60-65%, DD, biatrial dilatation, Ao sclerosis, dilated RV, moderate pulmonary HTN, elevated CV and RA pressures; b. patient reported echo at Dr. Milta Deiters office 02/2015 - his office does not have record of him being a pt there c. echo  11/2015: EF 60-65%, Grade 1 DD, mod-severe pulm pressures  . Chronic respiratory failure (HCC)    a. on 2L via nasal cannula; b. secondary to COPD  . COPD (chronic obstructive pulmonary disease) (HCC)   . Depression   . Depression   . Depression   . Emphysema of lung (HCC)   . GERD (gastroesophageal reflux disease)   . Hypertension   . Personal history of tobacco use, presenting hazards to health 08/17/2015  . Tobacco abuse     PAST SURGICAL HISTORY:   Past Surgical History:  Procedure Laterality Date  . ABDOMINAL SURGERY    . ADENOIDECTOMY    . CARPAL TUNNEL RELEASE Right   . corpal tunnel Right   . ELBOW SURGERY Right    repaired tendon  . hemmorhoid N/A   . hemorrh    . NASAL SINUS SURGERY    . NOSE SURGERY    . reflux surgery    . ROTATOR CUFF REPAIR Right   . SHOULDER SURGERY    . TENNIS ELBOW RELEASE/NIRSCHEL PROCEDURE Right   . URETHRA SURGERY     surgery 6 times from age [age] yrs old  . URETHRA SURGERY      SOCIAL HISTORY:   Social History   Tobacco Use  . Smoking status: Current Every Day Smoker    Packs/day: 1.50    Years: 40.00    Pack years:  60.00    Types: Cigarettes  . Smokeless tobacco: Never Used  Substance Use Topics  . Alcohol use: No    FAMILY HISTORY:   Family History  Problem Relation Age of Onset  . CAD Father   . Hyperlipidemia Father   . Stroke Father   . Heart disease Father   . Hypertension Mother   . Peripheral Artery Disease Mother   . Rheum arthritis Mother   . Asthma Mother   . Bipolar disorder Mother   . Depression Mother   . Malignant hypertension Mother   . Diabetes Neg Hx     DRUG ALLERGIES:   Allergies  Allergen Reactions  . Asa [Aspirin] Other (See Comments)    Reaction: swelling of the right side.  . Bee Venom Swelling  . Codeine Other (See Comments)    Reaction: Difficulty breathing  . Ibuprofen Other (See Comments)    Reaction: Swelling of the right side.  . Iodinated Diagnostic Agents Other (See  Comments)    Reaction:  Unknown  Other reaction(s): Unknown    REVIEW OF SYSTEMS:   Review of Systems  Constitutional: Negative for chills, fever, malaise/fatigue and weight loss.  HENT: Negative for ear discharge, ear pain, hearing loss and nosebleeds.   Eyes: Negative for blurred vision, double vision and photophobia.  Respiratory: Positive for cough, sputum production, shortness of breath and wheezing. Negative for hemoptysis.   Cardiovascular: Negative for chest pain, palpitations, orthopnea and leg swelling.  Gastrointestinal: Negative for abdominal pain, constipation, diarrhea, heartburn, melena, nausea and vomiting.  Genitourinary: Positive for frequency. Negative for dysuria and urgency.  Musculoskeletal: Negative for back pain, myalgias and neck pain.  Skin: Negative for rash.  Neurological: Positive for weakness. Negative for dizziness, tingling, sensory change, speech change, focal weakness and headaches.  Endo/Heme/Allergies: Does not bruise/bleed easily.  Psychiatric/Behavioral: Negative for depression.    MEDICATIONS AT HOME:   Prior to Admission medications   Medication Sig Start Date End Date Taking? Authorizing Provider  Albuterol Sulfate (PROAIR RESPICLICK) 108 (90 Base) MCG/ACT AEPB Inhale 2 puffs into the lungs every 4 (four) hours as needed (for shortness of breath). Reported on 12/10/2015 10/11/17   Yevonne Pax, MD  ALPRAZolam Prudy Feeler) 1 MG tablet Take 1 mg by mouth 3 (three) times daily as needed. 12/06/17   [provider]  escitalopram (LEXAPRO) 20 MG tablet Take 20 mg by mouth at bedtime. 01/01/18   [provider]  furosemide (LASIX) 20 MG tablet Take 1 tablet (20 mg total) by mouth daily. 11/07/17   Lyndon Code, MD  gabapentin (NEURONTIN) 300 MG capsule TAKE 1 TO 2 CAPSULES BY MOUTH THREE TIMES DAILY 01/01/18   Lyndon Code, MD  gabapentin (NEURONTIN) 600 MG tablet Take 0.5 tablets (300 mg total) by mouth at bedtime. 11/29/17   Noralee Stain, DO  guaiFENesin (ROBITUSSIN) 100 MG/5ML liquid Take 200 mg by mouth 4 (four) times daily as needed for cough.    [provider]  ipratropium-albuterol (DUONEB) 0.5-2.5 (3) MG/3ML SOLN Take 3 mLs by nebulization 4 (four) times daily as needed (for shortness of breath).    [provider]  mometasone-formoterol (DULERA) 200-5 MCG/ACT AERO Inhale 2 puffs into the lungs 2 (two) times daily. 11/29/17   Noralee Stain, DO  pantoprazole (PROTONIX) 40 MG tablet Take 1 tablet (40 mg total) by mouth daily at 12 noon. 11/29/17   Noralee Stain, DO  potassium chloride (K-DUR,KLOR-CON) 10 MEQ tablet Take 10 mEq by mouth daily.  [provider]  rOPINIRole (REQUIP) 0.25 MG tablet TAKE 1 TABLET BY MOUTH TWICE DAILY FOR CRAMPS 12/13/17   Lyndon Code, MD  Spacer/Aero-Holding Chambers [facility] ADVANTAGE-LG MASK) MISC Use as directed with inhaler diag  j44.1 09/28/17   Yevonne Pax, MD  theophylline (UNIPHYL) 400 MG 24 hr tablet Take 400 mg by mouth daily.     [provider]  tiotropium (SPIRIVA HANDIHALER) 18 MCG inhalation capsule Place 1 capsule (18 mcg total) into inhaler and inhale daily. 11/29/17 01/15/18  Noralee Stain, DO  umeclidinium bromide (INCRUSE ELLIPTA) 62.5 MCG/INH AEPB Inhale 1 puff into the lungs daily.    [provider]      VITAL SIGNS:  Blood pressure 121/88, pulse (!) 116, temperature 98.1 F (36.7 C), temperature source Oral, resp. rate 20, SpO2 97 %.  PHYSICAL EXAMINATION:   Physical Exam  GENERAL:  62 y.o.-year-old patient sitting in the bed with no acute distress.  EYES: Pupils equal, round, reactive to light and accommodation. No scleral icterus. Extraocular muscles intact.  HEENT: Head atraumatic, normocephalic. Oropharynx and nasopharynx clear.  NECK:  Supple, no jugular venous distention. No thyroid enlargement, no tenderness.  LUNGS: moving air bilaterally, diffuse scattered expiratory wheezing, no rales,rhonchi or  crepitation. No use of accessory muscles of respiration.  CARDIOVASCULAR: S1, S2 normal. No  rubs, or gallops. 2/6 systolic murmur present ABDOMEN: Soft, nontender, nondistended. Bowel sounds present. No organomegaly or mass.  EXTREMITIES: No  cyanosis, or clubbing. 2+ ankle edema NEUROLOGIC: Cranial nerves II through XII are intact. Muscle strength 5/5 in all extremities. Sensation intact. Gait not checked.  PSYCHIATRIC: The patient is alert and oriented x 3.  SKIN: No obvious rash, lesion, or ulcer.   LABORATORY PANEL:   CBC No results for input(s): WBC, HGB, HCT, PLT in the last 168 hours. ------------------------------------------------------------------------------------------------------------------  Chemistries  No results for input(s): NA, K, CL, CO2, GLUCOSE, BUN, CREATININE, CALCIUM, MG, AST, ALT, ALKPHOS, BILITOT in the last 168 hours.  Invalid input(s): GFRCGP ------------------------------------------------------------------------------------------------------------------  Cardiac Enzymes No results for input(s): TROPONINI in the last 168 hours. ------------------------------------------------------------------------------------------------------------------  RADIOLOGY:  Dg Chest 2 View  Result Date: 01/15/2018 CLINICAL DATA:  Shortness of breath, COPD EXAM: CHEST - 2 VIEW COMPARISON:  11/22/2017 FINDINGS: COPD changes with hyperinflation and bullous disease in the upper lobes. Increased lung markings in the lower lobes have improved since prior study, likely representing improving edema. No effusions. Heart is normal size. No acute bony abnormality. IMPRESSION: Improving pulmonary edema pattern. Bullous emphysematous changes. Electronically Signed   By: Charlett Nose M.D.   On: 01/15/2018 11:04    EKG:   Orders placed or performed during the hospital encounter of 11/22/17  . EKG 12-Lead  . EKG 12-Lead  . EKG 12-Lead  . EKG 12-Lead  . EKG 12-Lead  . EKG 12-Lead  .  EKG 12-Lead  . EKG 12-Lead    IMPRESSION AND PLAN:   Dustin [name]  is a 62 y.o. male with a known history of chronic respiratory failure secondary to COPD on 2 L home oxygen, chronic diastolic heart failure, depression anxiety, neuropathy presents from pulmonary office secondary to worsening shortness of breath and productive sputum.  1.  Acute on chronic COPD exacerbation-failed outpatient prednisone taper. -Still has significant tightness and wheezing on exam -On 2 L oxygen.  Continue that. -IV steroids, duo nebs -Chest x-ray is pending.  Since productive cough, order sputum cultures -Started on Rocephin and azithromycin -Pulmonary critical care consult.  2.  Acute  on chronic diastolic CHF exacerbation-with ankle swelling -Change Lasix to IV Lasix, low-salt diet -Daily weights and monitor intake and output  3.  Neuropathy-continue home medications  4.  Anxiety and depression-continue home medications  5.  DVT prophylaxis-Lovenox    All the records are reviewed and case discussed with ED provider. Management plans discussed with the patient, family and they are in agreement.  CODE STATUS: Full Code  TOTAL TIME TAKING CARE OF THIS PATIENT: 55 minutes.    Enid Baas M.D on 01/15/2018 at 11:59 AM  Between 7am to 6pm - Pager - [phone]  After 6pm go to [email or web address] - password [email or web address]  [facility] [place] [facility]  Office  [phone]  CC: Primary care physician; Lyndon Code, MD

## 2018-01-16 LAB — CBC
HCT: 42.7 % (ref 40.0–52.0)
Hemoglobin: 13.9 g/dL (ref 13.0–18.0)
MCH: 31.7 pg (ref 26.0–34.0)
MCHC: 32.6 g/dL (ref 32.0–36.0)
MCV: 97.2 fL (ref 80.0–100.0)
Platelets: 182 10*3/uL (ref 150–440)
RBC: 4.39 MIL/uL — ABNORMAL LOW (ref 4.40–5.90)
RDW: 15.2 % — AB (ref 11.5–14.5)
WBC: 12.8 10*3/uL — ABNORMAL HIGH (ref 3.8–10.6)

## 2018-01-16 LAB — BASIC METABOLIC PANEL
ANION GAP: 5 (ref 5–15)
BUN: 24 mg/dL — AB (ref 6–20)
CALCIUM: 8.9 mg/dL (ref 8.9–10.3)
CO2: 37 mmol/L — ABNORMAL HIGH (ref 22–32)
CREATININE: 1.29 mg/dL — AB (ref 0.61–1.24)
Chloride: 98 mmol/L — ABNORMAL LOW (ref 101–111)
GFR calc Af Amer: >60 mL/min (ref >60)
GFR, EST NON AFRICAN AMERICAN: 57 mL/min — AB (ref >60)
GLUCOSE: 155 mg/dL — AB (ref 65–99)
Potassium: 4.1 mmol/L (ref 3.5–5.1)
Sodium: 140 mmol/L (ref 135–145)

## 2018-01-16 MED ORDER — BACLOFEN 10 MG PO TABS
5.0000 mg | ORAL_TABLET | Freq: Two times a day (BID) | ORAL | Status: DC | PRN
  Administered 2018-01-16 – 2018-01-20 (×6): 5 mg via ORAL
  Filled 2018-01-16 (×8): qty 0.5

## 2018-01-16 MED ORDER — AZITHROMYCIN 500 MG PO TABS
500.0000 mg | ORAL_TABLET | Freq: Every day | ORAL | Status: AC
  Administered 2018-01-17 – 2018-01-19 (×3): 500 mg via ORAL
  Filled 2018-01-16 (×3): qty 1

## 2018-01-16 MED ORDER — GUAIFENESIN-DM 100-10 MG/5ML PO SYRP
5.0000 mL | ORAL_SOLUTION | ORAL | Status: DC | PRN
  Administered 2018-01-16 – 2018-01-20 (×4): 5 mL via ORAL
  Filled 2018-01-16 (×5): qty 5

## 2018-01-16 NOTE — Progress Notes (Signed)
[facility] - [place] at [facility]   PATIENT NAME: Dustin [name]    MR#:  [id number]  DATE OF BIRTH:  10-06-1955  SUBJECTIVE:  CHIEF COMPLAINT:  No chief complaint on file. Still have SOB. Uses oxygen at home.  REVIEW OF SYSTEMS:  CONSTITUTIONAL: No fever, fatigue or weakness.  EYES: No blurred or double vision.  EARS, NOSE, AND THROAT: No tinnitus or ear pain.  RESPIRATORY: have cough, shortness of breath, wheezing ,no hemoptysis.  CARDIOVASCULAR: No chest pain, orthopnea, edema.  GASTROINTESTINAL: No nausea, vomiting, diarrhea or abdominal pain.  GENITOURINARY: No dysuria, hematuria.  ENDOCRINE: No polyuria, nocturia,  HEMATOLOGY: No anemia, easy bruising or bleeding SKIN: No rash or lesion. MUSCULOSKELETAL: No joint pain or arthritis.   NEUROLOGIC: No tingling, numbness, weakness.  PSYCHIATRY: No anxiety or depression.   ROS  DRUG ALLERGIES:   Allergies  Allergen Reactions  . Asa [Aspirin] Other (See Comments)    Reaction: swelling of the right side.  . Bee Venom Swelling  . Codeine Other (See Comments)    Reaction: Difficulty breathing  . Ibuprofen Other (See Comments)    Reaction: Swelling of the right side.  . Iodinated Diagnostic Agents Other (See Comments)    Reaction:  Unknown  Other reaction(s): Unknown    VITALS:  Blood pressure 120/70, pulse (!) 108, temperature 98 F (36.7 C), temperature source Oral, resp. rate 18, height  (1.803 m), weight 98.1 kg (216 lb 4.8 oz), SpO2 92 %.  PHYSICAL EXAMINATION:  GENERAL:  62 y.o.-year-old patient lying in the bed with no acute distress.  EYES: Pupils equal, round, reactive to light and accommodation. No scleral icterus. Extraocular muscles intact.  HEENT: Head atraumatic, normocephalic. Oropharynx and nasopharynx clear.  NECK:  Supple, no jugular venous distention. No thyroid enlargement, no tenderness.  LUNGS: Decreased breath sounds bilaterally, have wheezing, no crepitation. No use of  accessory muscles of respiration.  CARDIOVASCULAR: S1, S2 normal. No murmurs, rubs, or gallops.  ABDOMEN: Soft, nontender, nondistended. Bowel sounds present. No organomegaly or mass.  EXTREMITIES: No pedal edema, cyanosis, or clubbing.  NEUROLOGIC: Cranial nerves II through XII are intact. Muscle strength 5/5 in all extremities. Sensation intact. Gait not checked.  PSYCHIATRIC: The patient is alert and oriented x 3.  SKIN: No obvious rash, lesion, or ulcer.   Physical Exam LABORATORY PANEL:   CBC Recent Labs  Lab 01/16/18 0533  WBC 12.8*  HGB 13.9  HCT 42.7  PLT 182   ------------------------------------------------------------------------------------------------------------------  Chemistries  Recent Labs  Lab 01/15/18 1139 01/16/18 0533  NA 140 140  K 3.9 4.1  CL 97* 98*  CO2 36* 37*  GLUCOSE 93 155*  BUN 20 24*  CREATININE 1.15 1.29*  CALCIUM 8.8* 8.9  AST 17  --   ALT 12*  --   ALKPHOS 52  --   BILITOT 1.2  --    ------------------------------------------------------------------------------------------------------------------  Cardiac Enzymes No results for input(s): TROPONINI in the last 168 hours. ------------------------------------------------------------------------------------------------------------------  RADIOLOGY:  Dg Chest 2 View  Result Date: 01/15/2018 CLINICAL DATA:  Shortness of breath, COPD EXAM: CHEST - 2 VIEW COMPARISON:  11/22/2017 FINDINGS: COPD changes with hyperinflation and bullous disease in the upper lobes. Increased lung markings in the lower lobes have improved since prior study, likely representing improving edema. No effusions. Heart is normal size. No acute bony abnormality. IMPRESSION: Improving pulmonary edema pattern. Bullous emphysematous changes. Electronically Signed   By: Charlett Nose M.D.   On: 01/15/2018 11:04    ASSESSMENT  AND PLAN:   Active Problems:   COPD exacerbation (HCC)  Dustin [name]  is a 62 y.o. male with  a known history of chronic respiratory failure secondary to COPD on 2 L home oxygen, chronic diastolic heart failure, depression anxiety, neuropathy presents from pulmonary office secondary to worsening shortness of breath and productive sputum.  1.  Acute on chronic COPD exacerbation-failed outpatient prednisone taper. - has significant tightness and wheezing on exam -On 3 L oxygen.  Continue that. -IV steroids, duo nebs -Chest x-ray is showing chronic bullous changes, no pneumonia.  Since productive cough, sent sputum cultures -Started on Rocephin and azithromycin -Pulmonary critical care consult appreciated  2.  Acute on chronic diastolic CHF exacerbation-with ankle swelling - IV Lasix, low-salt diet -Daily weights and monitor intake and output - Had > 2 ltr negative balance, Stop lasix now as renal func slightly worse.  3.  Neuropathy-continue home medications  4.  Anxiety and depression-continue home medications  5.  DVT prophylaxis-Lovenox      All the records are reviewed and case discussed with Care Management/Social Workerr. Management plans discussed with the patient, family and they are in agreement.  CODE STATUS: Full.  TOTAL TIME TAKING CARE OF THIS PATIENT: 35 minutes.     POSSIBLE D/C IN 1-2 DAYS, DEPENDING ON CLINICAL CONDITION.   Altamese Dilling M.D on 01/16/2018   Between 7am to 6pm - Pager - [phone]  After 6pm go to [email or web address] - password [email or web address]  [facility] [place] [facility]  Office  [phone]  CC: Primary care physician; Lyndon Code, MD  Note: This dictation was prepared with Dragon dictation along with smaller phrase technology. Any transcriptional errors that result from this process are unintentional.

## 2018-01-16 NOTE — Care Management (Addendum)
Patient lives with his son and has chronic home oxygen through [facility].  He woul d be in agreement with home health nurse follow up if attending feels it is needed.  Agency preference would be [facility].  [facility] referral

## 2018-01-16 NOTE — Consult Note (Signed)
   [facility] Inpatient Consult   01/16/2018  Dustin [name] 1955/10/01 [id number]   Patient is currently active with [facility] Care Management for chronic disease management services.  Patient has been engaged by a [employer] and CSW.  Our community based plan of care has focused on disease management and community resource support.  Patient will receive a post discharge transition of care call and will be evaluated for monthly home visits for assessments and disease process education.  Made Inpatient Case Manager aware that [facility] Care Management following. Of note, [facility] Care Management services do not replace or interfere with any services that are needed or arranged by inpatient case management or social work.  For additional questions or referrals please contact:  Edda Orea RN, BSN [facility] Liaison  [phone]) Business Mobile [phone]) Toll free office

## 2018-01-16 NOTE — Progress Notes (Signed)
[facility]     Assessment and Plan:    IMPRESSION:   Very advanced baseline COPD with acute exacerbation on chronic hypoxemic respiratory failure.  --Continue steroids, abx, nebs.  --Wean steroids and oxygen as tolerated.  --Smoking cessation.     Date: 01/16/2018  MRN# [id number] Dustin [name] 09-04-1956    HPI:  Patient was admitted to the hospital yesterday for acute COPD exacerbation from our office. Currently the patient is being treated with ceftriaxone, azithromycin, Solu-Medrol 60 mg every 6 hours, and guaifenesin as needed.  Pt appears to be feeling better today, conversational without dyspnea. Not yet back to baseline.   Sputum culture 01/15/2018; results pending. Imaging personally reviewed, chest x-ray 12/19/2017; hyperinflation, chronic bibasilar interstitial markings.  Medication:    Current Facility-Administered Medications:  .  acetaminophen (TYLENOL) tablet 650 mg, 650 mg, Oral, Q6H PRN **OR** acetaminophen (TYLENOL) suppository 650 mg, 650 mg, Rectal, Q6H PRN, Enid Baas, MD .  ALPRAZolam Prudy Feeler) tablet 1 mg, 1 mg, Oral, TID PRN, Enid Baas, MD, 1 mg at 01/16/18 0731 .  [START ON 01/17/2018] azithromycin (ZITHROMAX) tablet 500 mg, 500 mg, Oral, Daily, Altamese Dilling, MD .  baclofen (LIORESAL) tablet 5 mg, 5 mg, Oral, BID PRN, Cammy Copa, MD, 5 mg at 01/16/18 0236 .  cefTRIAXone (ROCEPHIN) 1 g in sodium chloride 0.9 % 100 mL IVPB, 1 g, Intravenous, Q24H, Enid Baas, MD, Stopped at 01/15/18 1403 .  docusate sodium (COLACE) capsule 100 mg, 100 mg, Oral, BID, Kalisetti, Radhika, MD .  enoxaparin (LOVENOX) injection 40 mg, 40 mg, Subcutaneous, Daily, Enid Baas, MD, 40 mg at 01/16/18 0939 .  escitalopram (LEXAPRO) tablet 20 mg, 20 mg, Oral, QHS, Kalisetti, Radhika, MD, 20 mg at 01/15/18 2220 .  furosemide (LASIX) injection 40 mg, 40 mg, Intravenous, Daily, Enid Baas, MD, 40 mg at 01/16/18  0939 .  gabapentin (NEURONTIN) capsule 300 mg, 300 mg, Oral, TID WC, Enid Baas, MD, 300 mg at 01/16/18 1131 .  gabapentin (NEURONTIN) tablet 300 mg, 300 mg, Oral, QHS, Kalisetti, Radhika, MD, 300 mg at 01/15/18 2227 .  guaiFENesin-dextromethorphan (ROBITUSSIN DM) 100-10 MG/5ML syrup 5 mL, 5 mL, Oral, Q4H PRN, Altamese Dilling, MD .  ipratropium-albuterol (DUONEB) 0.5-2.5 (3) MG/3ML nebulizer solution 3 mL, 3 mL, Nebulization, Q4H, Kalisetti, Radhika, MD, 3 mL at 01/16/18 1115 .  MEDLINE mouth rinse, 15 mL, Mouth Rinse, BID, Kalisetti, Radhika, MD, 15 mL at 01/15/18 2220 .  methylPREDNISolone sodium succinate (SOLU-MEDROL) 125 mg/2 mL injection 60 mg, 60 mg, Intravenous, Q6H, Enid Baas, MD, 60 mg at 01/16/18 0954 .  ondansetron (ZOFRAN) tablet 4 mg, 4 mg, Oral, Q6H PRN **OR** ondansetron (ZOFRAN) injection 4 mg, 4 mg, Intravenous, Q6H PRN, Nemiah Commander, Radhika, MD .  pantoprazole (PROTONIX) EC tablet 40 mg, 40 mg, Oral, Q1200, Enid Baas, MD, 40 mg at 01/16/18 1131 .  polyethylene glycol (MIRALAX / GLYCOLAX) packet 17 g, 17 g, Oral, Daily PRN, Nemiah Commander, Radhika, MD .  potassium chloride SA (K-DUR,KLOR-CON) CR tablet 20 mEq, 20 mEq, Oral, Daily, Enid Baas, MD, 20 mEq at 01/16/18 0939 .  rOPINIRole (REQUIP) tablet 0.25 mg, 0.25 mg, Oral, BID, Enid Baas, MD, 0.25 mg at 01/16/18 0939 .  theophylline (UNIPHYL) 400 MG 24 hr tablet 400 mg, 400 mg, Oral, Daily, Enid Baas, MD, 400 mg at 01/16/18 4098   Allergies:  Asa [aspirin]; Bee venom; Codeine; Ibuprofen; and Iodinated diagnostic agents  Review of Systems: Gen:  Denies  fever, sweats. HEENT: Denies blurred vision. Cvc:  No dizziness, chest pain or heaviness Resp:   Denies cough or sputum porduction. Gi: Denies swallowing difficulty, stomach pain. constipation, bowel incontinence Gu:  Denies bladder incontinence, burning urine Ext:   No Joint pain, stiffness. Skin: No skin rash, easy  bruising. Endoc:  No polyuria, polydipsia. Psych: No depression, insomnia. Other:  All other systems were reviewed and found to be negative other than what is mentioned in the HPI.   Physical Examination:   VS: BP 108/74 (BP Location: Left Arm)   Pulse (!) 115   Temp 98 F (36.7 C) (Oral)   Resp 18   Ht  (1.803 m)   Wt 216 lb 4.8 oz (98.1 kg)   SpO2 93%   BMI 30.17 kg/m    General Appearance: No distress  Neuro:without focal findings,  speech normal,  HEENT: PERRLA, EOM intact. Pulmonary: decreased air entry bilaterally, scattered wheezing.  CardiovascularNormal S1,S2.  No m/r/g.   Abdomen: Benign, Soft, non-tender. Renal:  No costovertebral tenderness  GU:  Not performed at this time. Endoc: No evident thyromegaly, no signs of acromegaly. Skin:   warm, no rash. Extremities: normal, no cyanosis, clubbing.   LABORATORY PANEL:   CBC Recent Labs  Lab 01/16/18 0533  WBC 12.8*  HGB 13.9  HCT 42.7  PLT 182   ------------------------------------------------------------------------------------------------------------------  Chemistries  Recent Labs  Lab 01/15/18 1139 01/16/18 0533  NA 140 140  K 3.9 4.1  CL 97* 98*  CO2 36* 37*  GLUCOSE 93 155*  BUN 20 24*  CREATININE 1.15 1.29*  CALCIUM 8.8* 8.9  AST 17  --   ALT 12*  --   ALKPHOS 52  --   BILITOT 1.2  --    ------------------------------------------------------------------------------------------------------------------  Cardiac Enzymes No results for input(s): TROPONINI in the last 168 hours. ------------------------------------------------------------  RADIOLOGY:   No results found for this or any previous visit. Results for orders placed during the hospital encounter of 01/15/18  DG Chest 2 View   Narrative CLINICAL DATA:  Shortness of breath, COPD  EXAM: CHEST - 2 VIEW  COMPARISON:  11/22/2017  FINDINGS: COPD changes with hyperinflation and bullous disease in the upper lobes.  Increased lung markings in the lower lobes have improved since prior study, likely representing improving edema. No effusions. Heart is normal size. No acute bony abnormality.  IMPRESSION: Improving pulmonary edema pattern.  Bullous emphysematous changes.   Electronically Signed   By: Charlett Nose M.D.   On: 01/15/2018 11:04    ------------------------------------------------------------------------------------------------------------------  Thank  you for allowing [facility] to assist in the care of your patient. Our recommendations are noted above.  Please contact us if we can be of further service.   Wells Guiles, MD.  [facility] Office Number: [phone]  Santiago Glad, M.D.  Billy Fischer, M.D  01/16/2018

## 2018-01-17 ENCOUNTER — Ambulatory Visit: Payer: Self-pay | Admitting: Internal Medicine

## 2018-01-17 LAB — BASIC METABOLIC PANEL
ANION GAP: 6 (ref 5–15)
BUN: 35 mg/dL — ABNORMAL HIGH (ref 6–20)
CALCIUM: 9 mg/dL (ref 8.9–10.3)
CO2: 35 mmol/L — ABNORMAL HIGH (ref 22–32)
Chloride: 99 mmol/L — ABNORMAL LOW (ref 101–111)
Creatinine, Ser: 1.06 mg/dL (ref 0.61–1.24)
GFR calc Af Amer: >60 mL/min (ref >60)
Glucose, Bld: 128 mg/dL — ABNORMAL HIGH (ref 65–99)
Potassium: 4.1 mmol/L (ref 3.5–5.1)
Sodium: 140 mmol/L (ref 135–145)

## 2018-01-17 LAB — PROCALCITONIN: Procalcitonin: <0.1 ng/mL

## 2018-01-17 MED ORDER — SODIUM CHLORIDE 0.9% FLUSH: 10.0000 mL | INTRAVENOUS | Status: DC | PRN

## 2018-01-17 NOTE — Progress Notes (Signed)
[employer] [facility] [place] at [facility]   PATIENT NAME: Dustin [name]    MR#:  [id number]  DATE OF BIRTH:  [birth year]  SUBJECTIVE:  CHIEF COMPLAINT:  No chief complaint on file. Still have SOB. Uses oxygen at home.  Have wheezing and SOB on minimal exertion.  REVIEW OF SYSTEMS:  CONSTITUTIONAL: No fever, fatigue or weakness.  EYES: No blurred or double vision.  EARS, NOSE, AND THROAT: No tinnitus or ear pain.  RESPIRATORY: have cough, shortness of breath, wheezing ,no hemoptysis.  CARDIOVASCULAR: No chest pain, orthopnea, edema.  GASTROINTESTINAL: No nausea, vomiting, diarrhea or abdominal pain.  GENITOURINARY: No dysuria, hematuria.  ENDOCRINE: No polyuria, nocturia,  HEMATOLOGY: No anemia, easy bruising or bleeding SKIN: No rash or lesion. MUSCULOSKELETAL: No joint pain or arthritis.   NEUROLOGIC: No tingling, numbness, weakness.  PSYCHIATRY: No anxiety or depression.   ROS  DRUG ALLERGIES:   Allergies  Allergen Reactions  . Asa [Aspirin] Other (See Comments)    Reaction: swelling of the right side.  . Bee Venom Swelling  . Codeine Other (See Comments)    Reaction: Difficulty breathing  . Ibuprofen Other (See Comments)    Reaction: Swelling of the right side.  . Iodinated Diagnostic Agents Other (See Comments)    Reaction:  Unknown  Other reaction(s): Unknown    VITALS:  Blood pressure 138/79, pulse (!) 110, temperature 97.7 F (36.5 C), temperature source Axillary, resp. rate 18, height  (1.803 m), weight 98.7 kg (217 lb 11.2 oz), SpO2 91 %.  PHYSICAL EXAMINATION:  GENERAL:  62 y.o.-year-old patient lying in the bed with no acute distress.  EYES: Pupils equal, round, reactive to light and accommodation. No scleral icterus. Extraocular muscles intact.  HEENT: Head atraumatic, normocephalic. Oropharynx and nasopharynx clear.  NECK:  Supple, no jugular venous distention. No thyroid enlargement, no tenderness.  LUNGS: Decreased breath sounds  bilaterally, have wheezing, no crepitation. No use of accessory muscles of respiration.  CARDIOVASCULAR: S1, S2 normal. No murmurs, rubs, or gallops.  ABDOMEN: Soft, nontender, nondistended. Bowel sounds present. No organomegaly or mass.  EXTREMITIES: No pedal edema, cyanosis, or clubbing.  NEUROLOGIC: Cranial nerves II through XII are intact. Muscle strength 5/5 in all extremities. Sensation intact. Gait not checked.  PSYCHIATRIC: The patient is alert and oriented x 3.  SKIN: No obvious rash, lesion, or ulcer.   Physical Exam LABORATORY PANEL:   CBC Recent Labs  Lab 01/16/18 0533  WBC 12.8*  HGB 13.9  HCT 42.7  PLT 182   ------------------------------------------------------------------------------------------------------------------  Chemistries  Recent Labs  Lab 01/15/18 1139  01/17/18 0525  NA 140   < > 140  K 3.9   < > 4.1  CL 97*   < > 99*  CO2 36*   < > 35*  GLUCOSE 93   < > 128*  BUN 20   < > 35*  CREATININE 1.15   < > 1.06  CALCIUM 8.8*   < > 9.0  AST 17  --   --   ALT 12*  --   --   ALKPHOS 52  --   --   BILITOT 1.2  --   --    < > = values in this interval not displayed.   ------------------------------------------------------------------------------------------------------------------  Cardiac Enzymes No results for input(s): TROPONINI in the last 168 hours. ------------------------------------------------------------------------------------------------------------------  RADIOLOGY:  No results found.  ASSESSMENT AND PLAN:   Active Problems:   COPD exacerbation (HCC)  Prudencio Velazco  is a 62  y.o. male with a known history of chronic respiratory failure secondary to COPD on 2 L home oxygen, chronic diastolic heart failure, depression anxiety, neuropathy presents from pulmonary office secondary to worsening shortness of breath and productive sputum.  1.  Acute on chronic COPD exacerbation-failed outpatient prednisone taper. - has significant  tightness and wheezing on exam -On 3 L oxygen.  Continue that. -IV steroids, duo nebs -Chest x-ray is showing chronic bullous changes, no pneumonia.  Since productive cough, sent sputum cultures -Started on Rocephin and azithromycin -Pulmonary critical care consult appreciated - encouraged to use flutter valve and incentive spirometer.  2.  Acute on chronic diastolic CHF exacerbation-with ankle swelling - IV Lasix, low-salt diet -Daily weights and monitor intake and output - Had > 2 ltr negative balance, Stop lasix now as renal func slightly worse.  renal func stable now.  3.  Neuropathy-continue home medications.  4.  Anxiety and depression-continue home medications.  5.  DVT prophylaxis-Lovenox.   All the records are reviewed and case discussed with Care Management/Social Workerr. Management plans discussed with the patient, family and they are in agreement.  CODE STATUS: Full.  TOTAL TIME TAKING CARE OF THIS PATIENT: 35 minutes.    POSSIBLE D/C IN 1-2 DAYS, DEPENDING ON CLINICAL CONDITION.   Altamese Dilling M.D on 01/17/2018   Between 7am to 6pm - Pager - [phone]  After 6pm go to [email or web address] - password [email or web address]  [facility]  Office  [phone]  CC: Primary care physician; Lyndon Code, MD  Note: This dictation was prepared with Dragon dictation along with smaller phrase technology. Any transcriptional errors that result from this process are unintentional.

## 2018-01-17 NOTE — Progress Notes (Signed)
PT Cancellation Note  Patient Details Name: Dustin [name] MRN: [id number] DOB: [birth year]   Cancelled Treatment:    Reason Eval/Treat Not Completed: Patient declined, no reason specified;Other (comment). Pt pleasantly declines noting feeling exhausted after medical procedure (PICC line) taking approximately 2 hours; unsure of why. Pt requests return tomorrow.    Scot Dock, PTA 01/17/2018, 4:38 PM

## 2018-01-18 LAB — CULTURE, RESPIRATORY W GRAM STAIN: Special Requests: NORMAL

## 2018-01-18 LAB — CULTURE, RESPIRATORY: CULTURE: NORMAL

## 2018-01-18 MED ORDER — PSEUDOEPHEDRINE HCL ER 120 MG PO TB12
120.0000 mg | ORAL_TABLET | Freq: Two times a day (BID) | ORAL | Status: DC
Start: 2018-01-18 — End: 2018-01-21
  Administered 2018-01-18 – 2018-01-21 (×7): 120 mg via ORAL
  Filled 2018-01-18 (×8): qty 1

## 2018-01-18 MED ORDER — FUROSEMIDE 20 MG PO TABS
20.0000 mg | ORAL_TABLET | Freq: Every day | ORAL | Status: DC
  Administered 2018-01-18 – 2018-01-19 (×2): 20 mg via ORAL
  Filled 2018-01-18 (×2): qty 1

## 2018-01-18 MED ORDER — METHYLPREDNISOLONE SODIUM SUCC 40 MG IJ SOLR
40.0000 mg | Freq: Three times a day (TID) | INTRAMUSCULAR | Status: DC
  Administered 2018-01-18 – 2018-01-19 (×3): 40 mg via INTRAVENOUS
  Filled 2018-01-18 (×3): qty 1

## 2018-01-18 MED ORDER — GABAPENTIN 300 MG PO CAPS
300.0000 mg | ORAL_CAPSULE | Freq: Three times a day (TID) | ORAL | Status: DC
  Administered 2018-01-18 – 2018-01-21 (×9): 300 mg via ORAL
  Filled 2018-01-18 (×9): qty 1

## 2018-01-18 MED ORDER — POTASSIUM CHLORIDE CRYS ER 10 MEQ PO TBCR
10.0000 meq | EXTENDED_RELEASE_TABLET | Freq: Every day | ORAL | Status: DC
  Administered 2018-01-19 – 2018-01-21 (×3): 10 meq via ORAL
  Filled 2018-01-18 (×3): qty 1

## 2018-01-18 MED ORDER — BUDESONIDE 0.5 MG/2ML IN SUSP
0.5000 mg | Freq: Two times a day (BID) | RESPIRATORY_TRACT | Status: DC
  Administered 2018-01-18 – 2018-01-21 (×6): 0.5 mg via RESPIRATORY_TRACT
  Filled 2018-01-18 (×6): qty 2

## 2018-01-18 NOTE — Progress Notes (Signed)
O2 sat was 89 on cpap with 2.5 lpm and 91 on cpap with 4 lpm

## 2018-01-18 NOTE — Progress Notes (Signed)
[employer] - [place] at [facility]   PATIENT NAME: Dustin [name]    MR#:  [id number]  DATE OF BIRTH:  14-May-1956  SUBJECTIVE:  CHIEF COMPLAINT:  No chief complaint on file. Still have SOB. Uses oxygen at home.  Have wheezing and SOB on minimal exertion.  REVIEW OF SYSTEMS:  CONSTITUTIONAL: No fever, fatigue or weakness.  EYES: No blurred or double vision.  EARS, NOSE, AND THROAT: No tinnitus or ear pain.  RESPIRATORY: have cough, shortness of breath, wheezing ,no hemoptysis.  CARDIOVASCULAR: No chest pain, orthopnea, edema.  GASTROINTESTINAL: No nausea, vomiting, diarrhea or abdominal pain.  GENITOURINARY: No dysuria, hematuria.  ENDOCRINE: No polyuria, nocturia,  HEMATOLOGY: No anemia, easy bruising or bleeding SKIN: No rash or lesion. MUSCULOSKELETAL: No joint pain or arthritis.   NEUROLOGIC: No tingling, numbness, weakness.  PSYCHIATRY: No anxiety or depression.   ROS  DRUG ALLERGIES:   Allergies  Allergen Reactions  . Asa [Aspirin] Other (See Comments)    Reaction: swelling of the right side.  . Bee Venom Swelling  . Codeine Other (See Comments)    Reaction: Difficulty breathing  . Ibuprofen Other (See Comments)    Reaction: Swelling of the right side.  . Iodinated Diagnostic Agents Other (See Comments)    Reaction:  Unknown  Other reaction(s): Unknown    VITALS:  Blood pressure 122/75, pulse (!) 118, temperature 97.9 F (36.6 C), temperature source Oral, resp. rate 12, height  (1.803 m), weight 98.7 kg (217 lb 8 oz), SpO2 92 %.  PHYSICAL EXAMINATION:  GENERAL:  62 y.o.-year-old patient lying in the bed with no acute distress.  EYES: Pupils equal, round, reactive to light and accommodation. No scleral icterus. Extraocular muscles intact.  HEENT: Head atraumatic, normocephalic. Oropharynx and nasopharynx clear.  NECK:  Supple, no jugular venous distention. No thyroid enlargement, no tenderness.  LUNGS: Decreased breath sounds  bilaterally, have wheezing, no crepitation. No use of accessory muscles of respiration.  CARDIOVASCULAR: S1, S2 normal. No murmurs, rubs, or gallops.  ABDOMEN: Soft, nontender, nondistended. Bowel sounds present. No organomegaly or mass.  EXTREMITIES: No pedal edema, cyanosis, or clubbing.  NEUROLOGIC: Cranial nerves II through XII are intact. Muscle strength 5/5 in all extremities. Sensation intact. Gait not checked.  PSYCHIATRIC: The patient is alert and oriented x 3.  SKIN: No obvious rash, lesion, or ulcer.   Physical Exam LABORATORY PANEL:   CBC Recent Labs  Lab 01/16/18 0533  WBC 12.8*  HGB 13.9  HCT 42.7  PLT 182   ------------------------------------------------------------------------------------------------------------------  Chemistries  Recent Labs  Lab 01/15/18 1139  01/17/18 0525  NA 140   < > 140  K 3.9   < > 4.1  CL 97*   < > 99*  CO2 36*   < > 35*  GLUCOSE 93   < > 128*  BUN 20   < > 35*  CREATININE 1.15   < > 1.06  CALCIUM 8.8*   < > 9.0  AST 17  --   --   ALT 12*  --   --   ALKPHOS 52  --   --   BILITOT 1.2  --   --    < > = values in this interval not displayed.   ------------------------------------------------------------------------------------------------------------------  Cardiac Enzymes No results for input(s): TROPONINI in the last 168 hours. ------------------------------------------------------------------------------------------------------------------  RADIOLOGY:  No results found.  ASSESSMENT AND PLAN:   Active Problems:   COPD exacerbation (HCC)  Dustin [name]  is a 62  y.o. male with a known history of chronic respiratory failure secondary to COPD on 2 L home oxygen, chronic diastolic heart failure, depression anxiety, neuropathy presents from pulmonary office secondary to worsening shortness of breath and productive sputum.  1.  Acute on chronic COPD exacerbation-failed outpatient prednisone taper. - has significant  tightness and wheezing on exam -On 3 L oxygen.  Continue that. -IV steroids, duo nebs -Chest x-ray is showing chronic bullous changes, no pneumonia.  Since productive cough, sent sputum cultures -Started on Rocephin and azithromycin -Pulmonary critical care consult appreciated - encouraged to use flutter valve and incentive spirometer. - Added Pulmicort.  2.  Acute on chronic diastolic CHF exacerbation-with ankle swelling - IV Lasix, low-salt diet -Daily weights and monitor intake and output - Had > 2 ltr negative balance, Stop lasix now as renal func slightly worse.  renal func stable now.  3.  Neuropathy-continue home medications.  4.  Anxiety and depression-continue home medications.  5.  DVT prophylaxis-Lovenox.   All the records are reviewed and case discussed with Care Management/Social Workerr. Management plans discussed with the patient, family and they are in agreement.  CODE STATUS: Full.  TOTAL TIME TAKING CARE OF THIS PATIENT: 35 minutes.    POSSIBLE D/C IN 1-2 DAYS, DEPENDING ON CLINICAL CONDITION.   Altamese Dilling M.D on 01/18/2018   Between 7am to 6pm - Pager - [phone]  After 6pm go to [email or web address] - password [email or web address] [facility]  [facility]  Office  [phone]  CC: Primary care physician; Lyndon Code, MD  Note: This dictation was prepared with Dragon dictation along with smaller phrase technology. Any transcriptional errors that result from this process are unintentional.

## 2018-01-18 NOTE — Progress Notes (Signed)
Pt placed on [facility] CPAP for sleep. CPAP plugged into red outlet with 3L O 2 in line. Pt tolerating well

## 2018-01-18 NOTE — Progress Notes (Signed)
[facility]     Assessment and Plan:    IMPRESSION:   Very advanced baseline COPD with acute exacerbation on chronic hypoxemic respiratory failure. Continued severe acute bronchitis with copious/excess mucus production.   --D/w patient needs to be more regular with IS and flutter valve, at least 4 times an hour while awake.  --Continue steroids, abx, nebs. Start sudafed.  --Wean steroids and oxygen as tolerated.  --Smoking cessation.     Date: 01/18/2018  MRN# [id number] Dustin [name] 23-Dec-1955    HPI:  Patient was admitted to the hospital  for acute COPD exacerbation from our office. Currently the patient remains on ceftriaxone, azithromycin, Solu-Medrol 60 mg every 6 hours, and guaifenesin as needed. Continues to have copious mucus secretions.   Sputum culture 01/15/2018; results are pending. Imaging personally reviewed, chest x-ray 01/15/2018; hyperinflation, chronic bibasilar interstitial markings.  Medication:    Current Facility-Administered Medications:  .  acetaminophen (TYLENOL) tablet 650 mg, 650 mg, Oral, Q6H PRN **OR** acetaminophen (TYLENOL) suppository 650 mg, 650 mg, Rectal, Q6H PRN, Enid Baas, MD .  ALPRAZolam Prudy Feeler) tablet 1 mg, 1 mg, Oral, TID PRN, Enid Baas, MD, 1 mg at 01/18/18 0612 .  azithromycin (ZITHROMAX) tablet 500 mg, 500 mg, Oral, Daily, Altamese Dilling, MD, 500 mg at 01/18/18 0849 .  baclofen (LIORESAL) tablet 5 mg, 5 mg, Oral, BID PRN, Cammy Copa, MD, 5 mg at 01/17/18 2157 .  cefTRIAXone (ROCEPHIN) 1 g in sodium chloride 0.9 % 100 mL IVPB, 1 g, Intravenous, Q24H, Enid Baas, MD, Stopped at 01/17/18 1700 .  docusate sodium (COLACE) capsule 100 mg, 100 mg, Oral, BID, Nemiah Commander, Radhika, MD, 100 mg at 01/18/18 0849 .  enoxaparin (LOVENOX) injection 40 mg, 40 mg, Subcutaneous, Daily, Enid Baas, MD, 40 mg at 01/18/18 0850 .  escitalopram (LEXAPRO) tablet 20 mg, 20 mg, Oral, QHS,  Kalisetti, Radhika, MD, 20 mg at 01/17/18 2157 .  gabapentin (NEURONTIN) capsule 300 mg, 300 mg, Oral, TID WC, Enid Baas, MD, 300 mg at 01/18/18 0849 .  gabapentin (NEURONTIN) tablet 300 mg, 300 mg, Oral, QHS, Kalisetti, Radhika, MD, 300 mg at 01/17/18 2156 .  guaiFENesin-dextromethorphan (ROBITUSSIN DM) 100-10 MG/5ML syrup 5 mL, 5 mL, Oral, Q4H PRN, Altamese Dilling, MD, 5 mL at 01/16/18 1500 .  ipratropium-albuterol (DUONEB) 0.5-2.5 (3) MG/3ML nebulizer solution 3 mL, 3 mL, Nebulization, Q4H, Kalisetti, Radhika, MD, 3 mL at 01/18/18 0742 .  MEDLINE mouth rinse, 15 mL, Mouth Rinse, BID, Kalisetti, Radhika, MD, 15 mL at 01/17/18 1012 .  methylPREDNISolone sodium succinate (SOLU-MEDROL) 125 mg/2 mL injection 60 mg, 60 mg, Intravenous, Q6H, Enid Baas, MD, 60 mg at 01/18/18 0442 .  ondansetron (ZOFRAN) tablet 4 mg, 4 mg, Oral, Q6H PRN **OR** ondansetron (ZOFRAN) injection 4 mg, 4 mg, Intravenous, Q6H PRN, Nemiah Commander, Radhika, MD .  pantoprazole (PROTONIX) EC tablet 40 mg, 40 mg, Oral, Q1200, Enid Baas, MD, 40 mg at 01/17/18 1239 .  polyethylene glycol (MIRALAX / GLYCOLAX) packet 17 g, 17 g, Oral, Daily PRN, Nemiah Commander, Radhika, MD .  potassium chloride SA (K-DUR,KLOR-CON) CR tablet 20 mEq, 20 mEq, Oral, Daily, Enid Baas, MD, 20 mEq at 01/18/18 0849 .  rOPINIRole (REQUIP) tablet 0.25 mg, 0.25 mg, Oral, BID, Enid Baas, MD, 0.25 mg at 01/18/18 0850 .  sodium chloride flush (NS) 0.9 % injection 10-40 mL, 10-40 mL, Intracatheter, PRN, Altamese Dilling, MD .  theophylline (UNIPHYL) 400 MG 24 hr tablet 400 mg, 400 mg, Oral, Daily, Nemiah Commander, Radhika, MD, 400 mg at  01/18/18 0850   Allergies:  Asa [aspirin]; Bee venom; Codeine; Ibuprofen; and Iodinated diagnostic agents  Review of Systems: Gen:  Denies  fever, sweats. HEENT: Denies blurred vision. Cvc:  No dizziness, chest pain or heaviness Resp:   Denies cough or sputum porduction. Gi: Denies  swallowing difficulty, stomach pain. constipation, bowel incontinence Gu:  Denies bladder incontinence, burning urine Ext:   No Joint pain, stiffness. Skin: No skin rash, easy bruising. Endoc:  No polyuria, polydipsia. Psych: No depression, insomnia. Other:  All other systems were reviewed and found to be negative other than what is mentioned in the HPI.   Physical Examination:   VS: BP 133/85 (BP Location: Right Arm)   Pulse (!) 105   Temp (!) 96.7 F (35.9 C) (Axillary)   Resp 18   Ht  (1.803 m)   Wt 217 lb 8 oz (98.7 kg)   SpO2 92%   BMI 30.34 kg/m    General Appearance: No distress  Neuro:without focal findings,  speech normal,  HEENT: PERRLA, EOM intact. Pulmonary: decreased air entry bilaterally, scattered wheezing.  CardiovascularNormal S1,S2.  No m/r/g.   Abdomen: Benign, Soft, non-tender. Renal:  No costovertebral tenderness  GU:  Not performed at this time. Endoc: No evident thyromegaly, no signs of acromegaly. Skin:   warm, no rash. Extremities: normal, no cyanosis, clubbing.   LABORATORY PANEL:   CBC Recent Labs  Lab 01/16/18 0533  WBC 12.8*  HGB 13.9  HCT 42.7  PLT 182   ------------------------------------------------------------------------------------------------------------------  Chemistries  Recent Labs  Lab 01/15/18 1139  01/17/18 0525  NA 140   < > 140  K 3.9   < > 4.1  CL 97*   < > 99*  CO2 36*   < > 35*  GLUCOSE 93   < > 128*  BUN 20   < > 35*  CREATININE 1.15   < > 1.06  CALCIUM 8.8*   < > 9.0  AST 17  --   --   ALT 12*  --   --   ALKPHOS 52  --   --   BILITOT 1.2  --   --    < > = values in this interval not displayed.   ------------------------------------------------------------------------------------------------------------------  Cardiac Enzymes No results for input(s): TROPONINI in the last 168 hours. ------------------------------------------------------------  RADIOLOGY:   No results found for this or any  previous visit. Results for orders placed during the hospital encounter of 01/15/18  DG Chest 2 View   Narrative CLINICAL DATA:  Shortness of breath, COPD  EXAM: CHEST - 2 VIEW  COMPARISON:  11/22/2017  FINDINGS: COPD changes with hyperinflation and bullous disease in the upper lobes. Increased lung markings in the lower lobes have improved since prior study, likely representing improving edema. No effusions. Heart is normal size. No acute bony abnormality.  IMPRESSION: Improving pulmonary edema pattern.  Bullous emphysematous changes.   Electronically Signed   By: Charlett Nose M.D.   On: 01/15/2018 11:04    ------------------------------------------------------------------------------------------------------------------  Thank  you for allowing [facility] to assist in the care of your patient. Our recommendations are noted above.  Please contact us if we can be of further service.   Wells Guiles, MD.  [facility] Office Number: [phone]  Santiago Glad, M.D.  Billy Fischer, M.D  01/18/2018

## 2018-01-18 NOTE — Progress Notes (Signed)
Physical Therapy Treatment Patient Details Name: Dustin [name] MRN: [id number] DOB: 05-06-1956 Today's Date: 01/18/2018    History of Present Illness Pt is a 62 y.o. male presenting with worsening SOB and productive sputum.  H/o intubation 11/22/17 with extubation 11/25/17 and has been home from rehab for about 3 weeks.  Pt direct admit for COPD exacerbation (failed 2 attempts OP treatment).  PMH includes O2 dependent 2 LPM baseline, CHF, COPD, depression, htn, (+) smoking.    PT Comments    Pt with increased WOB and SOB even in supine upon arrival but SpO2 initially in the mid 90s on 2.5 L O2.  Pt demonstrates safe technique with bed mobility and sit>stand transfers.  He becomes increasingly SOB while ambulating with SpO2 as low as 84% on 3L O2 when ambulating.  Down as low as 81% sitting EOB after ambulating, pt requires 1-2 minutes to recover to mid 90s. Pulse as high as 134 when ambulating but this improves with rest.  Encouraged pt to ambulate with nursing staff at least 3x/day.    Follow Up Recommendations  No PT follow up     Equipment Recommendations  None recommended by PT    Recommendations for Other Services       Precautions / Restrictions Precautions Precautions: Fall;Other (comment) Precaution Comments: monitor pulse and O2 Restrictions Weight Bearing Restrictions: No    Mobility  Bed Mobility Overal bed mobility: Modified Independent             General bed mobility comments: Supine to sit with HOB elevated with mild increased effort.  Transfers Overall transfer level: Independent Equipment used: None             General transfer comment: No signs of instability, pt performs independently and safely.   Ambulation/Gait Ambulation/Gait assistance: Supervision Ambulation Distance (Feet): 40 Feet Assistive device: None Gait Pattern/deviations: Step-through pattern Gait velocity: mildly decreased   General Gait Details: Slower but steady gait.  Pt  does reach out for wall on occasion when he is taking a rest break due to SOB.  SpO2 as low as 84% on 3L O2 when ambulating.  Down as low as 81% sitting EOB after ambulating, pt requires 1-2 minutes to recover to mid 90s.    Stairs             Wheelchair Mobility    Modified Rankin (Stroke Patients Only)       Balance Overall balance assessment: Needs assistance Sitting-balance support: No upper extremity supported;Feet supported Sitting balance-Leahy Scale: Normal     Standing balance support: No upper extremity supported;During functional activity Standing balance-Leahy Scale: Good                              Cognition Arousal/Alertness: Awake/alert Behavior During Therapy: WFL for tasks assessed/performed Overall Cognitive Status: Within Functional Limits for tasks assessed                                        Exercises Other Exercises Other Exercises: Seated scapular retractions x10 for improved posture and improved pulmonary function Other Exercises: Encouraged pt to ambulate with nursing staff at least 3x/day Other Exercises: Encouraged pt to set up chairs in each room of house for the option for a seated rest break when ambulating in home    General Comments General comments (skin integrity,  edema, etc.): Pulse as high as 134 when ambulating but this improves with rest.        Pertinent Vitals/Pain Pain Assessment: No/denies pain    Home Living                      Prior Function            PT Goals (current goals can now be found in the care plan section) Acute Rehab PT Goals Patient Stated Goal: to improve breathing with mobility PT Goal Formulation: With patient Time For Goal Achievement: 01/29/18 Potential to Achieve Goals: Fair Progress towards PT goals: Progressing toward goals    Frequency    Min 2X/week      PT Plan Current plan remains appropriate    Co-evaluation               AM-PAC PT "6 Clicks" Daily Activity  Outcome Measure  Difficulty turning over in bed (including adjusting bedclothes, sheets and blankets)?: A Little Difficulty moving from lying on back to sitting on the side of the bed? : A Little Difficulty sitting down on and standing up from a chair with arms (e.g., wheelchair, bedside commode, etc,.)?: A Little Help needed moving to and from a bed to chair (including a wheelchair)?: A Little Help needed walking in hospital room?: A Little Help needed climbing 3-5 steps with a railing? : A Little 6 Click Score: 18    End of Session Equipment Utilized During Treatment: Gait belt;Oxygen Activity Tolerance: Treatment limited secondary to medical complications (Comment)(elevated pulse and hypoxia) Patient left: in bed;with call bell/phone within reach;with bed alarm set;Other (comment)(HOB elevated for sitting upright) Nurse Communication: Mobility status;Other (comment)(SpO2) PT Visit Diagnosis: Other abnormalities of gait and mobility (R26.89)     Time: 4401-0272 PT Time Calculation (min) (ACUTE ONLY): 25 min  Charges:  $Gait Training: 8-22 mins $Therapeutic Activity: 8-22 mins                    G Codes:       Encarnacion Chu PT, DPT 01/18/2018, 4:20 PM

## 2018-01-18 NOTE — Care Management Important Message (Signed)
Important Message  Patient Details  Name: Dustin [name] MRN: [id number] Date of Birth: 30-Mar-1956   Medicare Important Message Given:  Yes    Olegario Messier A Emma-Lee Oddo 01/18/2018, 11:29 AM

## 2018-01-18 NOTE — Progress Notes (Signed)
Pharmacy Antibiotic Note  Dustin [name] is a 62 y.o. male admitted on 01/15/2018 with pneumonia.  Pharmacy has been consulted for Ceftriaxone dosing.  Plan: Continue ceftriaxone 1 gram IV q24h  (patient also ordered azithromycin PO)  Height:  (180.3 cm) Weight: 217 lb 8 oz (98.7 kg) IBW/kg (Calculated) : 75.3  Temp (24hrs), Avg:97.7 F (36.5 C), Min:96.7 F (35.9 C), Max:98.3 F (36.8 C)  Recent Labs  Lab 01/15/18 1139 01/16/18 0533 01/17/18 0525  WBC 14.5* 12.8*  --   CREATININE 1.15 1.29* 1.06    Estimated Creatinine Clearance: 85.5 mL/min (by C-G formula based on SCr of 1.06 mg/dL).    Allergies  Allergen Reactions  . Asa [Aspirin] Other (See Comments)    Reaction: swelling of the right side.  . Bee Venom Swelling  . Codeine Other (See Comments)    Reaction: Difficulty breathing  . Ibuprofen Other (See Comments)    Reaction: Swelling of the right side.  . Iodinated Diagnostic Agents Other (See Comments)    Reaction:  Unknown  Other reaction(s): Unknown    Antimicrobials this admission: Azith/CTX 4/30 >>       >>    Dose adjustments this admission:    Microbiology results: Sputum:   pending     Thank you for allowing pharmacy to be a part of this patient's care.  Marty Heck 01/18/2018 9:17 AM

## 2018-01-18 NOTE — Plan of Care (Signed)
  Problem: Education: Goal: Knowledge of General Education information will improve Outcome: Progressing   Problem: Health Behavior/Discharge Planning: Goal: Ability to manage health-related needs will improve Outcome: Progressing   Problem: Clinical Measurements: Goal: Ability to maintain clinical measurements within normal limits will improve Outcome: Progressing Goal: Will remain free from infection Outcome: Progressing Goal: Diagnostic test results will improve Outcome: Progressing Goal: Respiratory complications will improve Outcome: Progressing Note:  Cont to require  02 3l when up 2.5 while at rest. P.t. Saw  pt this pm . Sats dropped  low 80s  at end of the amb. Came back up to mid 90s on 2.5 liters . Pt on 2 l chronic at home Goal: Cardiovascular complication will be avoided Outcome: Progressing Note:  lasix   Problem: Activity: Goal: Risk for activity intolerance will decrease Outcome: Progressing   Problem: Nutrition: Goal: Adequate nutrition will be maintained Outcome: Progressing   Problem: Coping: Goal: Level of anxiety will decrease Outcome: Progressing Note:  Xanax  3xs day prn. Pt taking this   Problem: Elimination: Goal: Will not experience complications related to bowel motility Outcome: Progressing Goal: Will not experience complications related to urinary retention Outcome: Progressing   Problem: Pain Managment: Goal: General experience of comfort will improve Outcome: Progressing   Problem: Safety: Goal: Ability to remain free from injury will improve Outcome: Progressing   Problem: Skin Integrity: Goal: Risk for impaired skin integrity will decrease Outcome: Progressing

## 2018-01-19 LAB — BASIC METABOLIC PANEL
ANION GAP: 6 (ref 5–15)
BUN: 34 mg/dL — ABNORMAL HIGH (ref 6–20)
CHLORIDE: 96 mmol/L — AB (ref 101–111)
CO2: 35 mmol/L — ABNORMAL HIGH (ref 22–32)
CREATININE: 1.16 mg/dL (ref 0.61–1.24)
Calcium: 9.1 mg/dL (ref 8.9–10.3)
GFR calc non Af Amer: >60 mL/min (ref >60)
Glucose, Bld: 159 mg/dL — ABNORMAL HIGH (ref 65–99)
Potassium: 4.9 mmol/L (ref 3.5–5.1)
Sodium: 137 mmol/L (ref 135–145)

## 2018-01-19 LAB — MAGNESIUM: Magnesium: 2.4 mg/dL (ref 1.7–2.4)

## 2018-01-19 MED ORDER — PREDNISONE 20 MG PO TABS
40.0000 mg | ORAL_TABLET | Freq: Every day | ORAL | Status: DC
  Administered 2018-01-20 – 2018-01-21 (×2): 40 mg via ORAL
  Filled 2018-01-19 (×2): qty 2

## 2018-01-19 MED ORDER — IPRATROPIUM-ALBUTEROL 0.5-2.5 (3) MG/3ML IN SOLN
3.0000 mL | Freq: Four times a day (QID) | RESPIRATORY_TRACT | Status: DC
  Administered 2018-01-19 – 2018-01-21 (×8): 3 mL via RESPIRATORY_TRACT
  Filled 2018-01-19 (×9): qty 3

## 2018-01-19 MED ORDER — SACCHAROMYCES BOULARDII 250 MG PO CAPS
250.0000 mg | ORAL_CAPSULE | Freq: Two times a day (BID) | ORAL | Status: DC
  Administered 2018-01-19 – 2018-01-21 (×5): 250 mg via ORAL
  Filled 2018-01-19 (×5): qty 1

## 2018-01-19 NOTE — Progress Notes (Signed)
[employer] [facility] [place] [facility]   PATIENT NAME: Dustin [name]    MR#:  [id number]  DATE OF BIRTH:  [birth year]  SUBJECTIVE:  CHIEF COMPLAINT:  No chief complaint on file. Still have SOB. Uses oxygen at home. Weaned O2 [place] down to 2L.  REVIEW OF SYSTEMS:  CONSTITUTIONAL: No fever, fatigue or weakness.  EYES: No blurred or double vision.  EARS, NOSE, AND THROAT: No tinnitus or ear pain.  RESPIRATORY: have cough, shortness of breath, wheezing ,no hemoptysis.  CARDIOVASCULAR: No chest pain, orthopnea, edema.  GASTROINTESTINAL: No nausea, vomiting, diarrhea or abdominal pain.  GENITOURINARY: No dysuria, hematuria.  ENDOCRINE: No polyuria, nocturia,  HEMATOLOGY: No anemia, easy bruising or bleeding SKIN: No rash or lesion. MUSCULOSKELETAL: No joint pain or arthritis.   NEUROLOGIC: No tingling, numbness, weakness.  PSYCHIATRY: has anxiety but no depression.   ROS  DRUG ALLERGIES:   Allergies  Allergen Reactions  . Asa [Aspirin] Other (See Comments)    Reaction: swelling of the right side.  . Bee Venom Swelling  . Codeine Other (See Comments)    Reaction: Difficulty breathing  . Ibuprofen Other (See Comments)    Reaction: Swelling of the right side.  . Iodinated Diagnostic Agents Other (See Comments)    Reaction:  Unknown  Other reaction(s): Unknown    VITALS:  Blood pressure (!) 141/77, pulse (!) 110, temperature 97.6 F (36.4 C), temperature source Axillary, resp. rate (!) 24, height  (1.803 m), weight 217 lb 14.4 oz (98.8 kg), SpO2 94 %.  PHYSICAL EXAMINATION:  GENERAL:  62 y.o.-year-old patient lying in the bed with no acute distress.  EYES: Pupils equal, round, reactive to light and accommodation. No scleral icterus. Extraocular muscles intact.  HEENT: Head atraumatic, normocephalic. Oropharynx and nasopharynx clear.  NECK:  Supple, no jugular venous distention. No thyroid enlargement, no tenderness.  LUNGS: Decreased breath sounds  bilaterally, have wheezing, no crepitation. No use of accessory muscles of respiration.  CARDIOVASCULAR: S1, S2 normal. No murmurs, rubs, or gallops.  ABDOMEN: Soft, nontender, nondistended. Bowel sounds present. No organomegaly or mass.  EXTREMITIES: No pedal edema, cyanosis, or clubbing.  NEUROLOGIC: Cranial nerves II through XII are intact. Muscle strength 5/5 in all extremities. Sensation intact. Gait not checked.  PSYCHIATRIC: The patient is alert and oriented x 3.  SKIN: No obvious rash, lesion, or ulcer.   Physical Exam LABORATORY PANEL:   CBC Recent Labs  Lab 01/16/18 0533  WBC 12.8*  HGB 13.9  HCT 42.7  PLT 182   ------------------------------------------------------------------------------------------------------------------  Chemistries  Recent Labs  Lab 01/15/18 1139  01/19/18 0946  NA 140   < > 137  K 3.9   < > 4.9  CL 97*   < > 96*  CO2 36*   < > 35*  GLUCOSE 93   < > 159*  BUN 20   < > 34*  CREATININE 1.15   < > 1.16  CALCIUM 8.8*   < > 9.1  MG  --   --  2.4  AST 17  --   --   ALT 12*  --   --   ALKPHOS 52  --   --   BILITOT 1.2  --   --    < > = values in this interval not displayed.   ------------------------------------------------------------------------------------------------------------------  Cardiac Enzymes No results for input(s): TROPONINI in the last 168 hours. ------------------------------------------------------------------------------------------------------------------  RADIOLOGY:  No results found.  ASSESSMENT AND PLAN:   Active Problems:  COPD exacerbation (HCC)  Anant Agard  is a 62 y.o. male with a known history of chronic respiratory failure secondary to COPD on 2 L home oxygen, chronic diastolic heart failure, depression anxiety, neuropathy presents from pulmonary office secondary to worsening shortness of breath and productive sputum.  1.  Acute on chronic COPD exacerbation-failed outpatient prednisone taper. -On 2  L oxygen.  Discontinue IV steroids, change to prednisone, continue duo nebs -Chest x-ray is showing chronic bullous changes, no pneumonia.  Since productive cough, sent sputum cultures: normal respiratory flora. Completed Rocephin and azithromycin -Pulmonary critical care consult appreciated - encouraged to use flutter valve and incentive spirometer. - Added Pulmicort.  2.  Acute on chronic diastolic CHF exacerbation-with ankle swelling He was on IV Lasix, low-salt diet -Daily weights and monitor intake and output - Had > 2 ltr negative balance, Stop lasix as renal func slightly worse.  renal func stable now.  3.  Neuropathy-continue home medications.  4.  Anxiety and depression-continue home medications.  5.  DVT prophylaxis-Lovenox.   All the records are reviewed and case discussed with Care Management/Social Workerr. Management plans discussed with the patient, family and they are in agreement.  CODE STATUS: Full.  TOTAL TIME TAKING CARE OF THIS PATIENT: 35 minutes.    POSSIBLE D/C IN 1-2 DAYS, DEPENDING ON CLINICAL CONDITION.   Shaune Pollack M.D on 01/19/2018   Between 7am to 6pm - Pager - [phone]  After 6pm go to [email or web address] - password [email or web address] [facility]  [facility] [place] [facility]  Office  [phone]  CC: Primary care physician; Lyndon Code, MD  Note: This dictation was prepared with Dragon dictation along with smaller phrase technology. Any transcriptional errors that result from this process are unintentional.

## 2018-01-20 LAB — BASIC METABOLIC PANEL
Anion gap: 5 (ref 5–15)
BUN: 34 mg/dL — ABNORMAL HIGH (ref 6–20)
CHLORIDE: 98 mmol/L — AB (ref 101–111)
CO2: 37 mmol/L — AB (ref 22–32)
Calcium: 8.7 mg/dL — ABNORMAL LOW (ref 8.9–10.3)
Creatinine, Ser: 1.05 mg/dL (ref 0.61–1.24)
GFR calc non Af Amer: >60 mL/min (ref >60)
Glucose, Bld: 99 mg/dL (ref 65–99)
POTASSIUM: 4.3 mmol/L (ref 3.5–5.1)
Sodium: 140 mmol/L (ref 135–145)

## 2018-01-20 LAB — MAGNESIUM: Magnesium: 2.3 mg/dL (ref 1.7–2.4)

## 2018-01-20 NOTE — Plan of Care (Addendum)
O2 sats 89% on 2L O2 per [place]. Increased O2 to 3L, sats up to 92%. HR 115-118 at rest. Dr. Imogene Burn made aware of the above. Pt denies pain.

## 2018-01-20 NOTE — Progress Notes (Addendum)
[employer] [facility] - [place] at [facility]   PATIENT NAME: Dustin [name]    MR#:  [id number]  DATE OF BIRTH:  09-Apr-1956  SUBJECTIVE:  CHIEF COMPLAINT:  No chief complaint on file. Still have SOB and wheezing. Uses oxygen at home. Weaned O2 [place] down to 2L.  REVIEW OF SYSTEMS:  CONSTITUTIONAL: No fever, fatigue or weakness.  EYES: No blurred or double vision.  EARS, NOSE, AND THROAT: No tinnitus or ear pain.  RESPIRATORY: have cough, shortness of breath, wheezing ,no hemoptysis.  CARDIOVASCULAR: No chest pain, orthopnea, edema.  GASTROINTESTINAL: No nausea, vomiting, diarrhea or abdominal pain.  GENITOURINARY: No dysuria, hematuria.  ENDOCRINE: No polyuria, nocturia,  HEMATOLOGY: No anemia, easy bruising or bleeding SKIN: No rash or lesion. MUSCULOSKELETAL: No joint pain or arthritis.   NEUROLOGIC: No tingling, numbness, weakness.  PSYCHIATRY: has anxiety but no depression.   ROS  DRUG ALLERGIES:   Allergies  Allergen Reactions  . Asa [Aspirin] Other (See Comments)    Reaction: swelling of the right side.  . Bee Venom Swelling  . Codeine Other (See Comments)    Reaction: Difficulty breathing  . Ibuprofen Other (See Comments)    Reaction: Swelling of the right side.  . Iodinated Diagnostic Agents Other (See Comments)    Reaction:  Unknown  Other reaction(s): Unknown    VITALS:  Blood pressure 129/79, pulse (!) 117, temperature 98 F (36.7 C), temperature source Oral, resp. rate 18, height  (1.803 m), weight 214 lb 4 oz (97.2 kg), SpO2 92 %.  PHYSICAL EXAMINATION:  GENERAL:  62 y.o.-year-old patient lying in the bed with no acute distress.  EYES: Pupils equal, round, reactive to light and accommodation. No scleral icterus. Extraocular muscles intact.  HEENT: Head atraumatic, normocephalic. Oropharynx and nasopharynx clear.  NECK:  Supple, no jugular venous distention. No thyroid enlargement, no tenderness.  LUNGS: Decreased breath sounds bilaterally,  have wheezing, no crepitation. No use of accessory muscles of respiration.  CARDIOVASCULAR: S1, S2 normal. No murmurs, rubs, or gallops.  ABDOMEN: Soft, nontender, nondistended. Bowel sounds present. No organomegaly or mass.  EXTREMITIES: No pedal edema, cyanosis, or clubbing.  NEUROLOGIC: Cranial nerves II through XII are intact. Muscle strength 5/5 in all extremities. Sensation intact. Gait not checked.  PSYCHIATRIC: The patient is alert and oriented x 3.  SKIN: No obvious rash, lesion, or ulcer.   Physical Exam LABORATORY PANEL:   CBC Recent Labs  Lab 01/16/18 0533  WBC 12.8*  HGB 13.9  HCT 42.7  PLT 182   ------------------------------------------------------------------------------------------------------------------  Chemistries  Recent Labs  Lab 01/15/18 1139  01/20/18 0340  NA 140   < > 140  K 3.9   < > 4.3  CL 97*   < > 98*  CO2 36*   < > 37*  GLUCOSE 93   < > 99  BUN 20   < > 34*  CREATININE 1.15   < > 1.05  CALCIUM 8.8*   < > 8.7*  MG  --    < > 2.3  AST 17  --   --   ALT 12*  --   --   ALKPHOS 52  --   --   BILITOT 1.2  --   --    < > = values in this interval not displayed.   ------------------------------------------------------------------------------------------------------------------  Cardiac Enzymes No results for input(s): TROPONINI in the last 168 hours. ------------------------------------------------------------------------------------------------------------------  RADIOLOGY:  No results found.  ASSESSMENT AND PLAN:   Active Problems:  COPD exacerbation (HCC)  Dustin [name]  is a 62 y.o. male with a known history of chronic respiratory failure secondary to COPD on 2 L home oxygen, chronic diastolic heart failure, depression anxiety, neuropathy presents from pulmonary office secondary to worsening shortness of breath and productive sputum.  1.  Acute on chronic COPD exacerbation-failed outpatient prednisone taper. -On 2 L oxygen.   Discontinued IV steroids, changed to prednisone, continue duo nebs -Chest x-ray is showing chronic bullous changes, no pneumonia.  Since productive cough, sent sputum cultures: normal respiratory flora. Completed Rocephin and azithromycin -Pulmonary critical care consult appreciated - encouraged to use flutter valve and incentive spirometer. - Added Pulmicort.  2.  Acute on chronic diastolic CHF exacerbation-with ankle swelling He was on IV Lasix, low-salt diet -Daily weights and monitor intake and output - Had > 2 ltr negative balance, Stop lasix as renal func slightly worse.  renal func stable now.  3.  Neuropathy-continue home medications.  4.  Anxiety and depression-continue home medications.  5.  DVT prophylaxis-Lovenox.  Tobacco use. Smoking cessation was counseled for 4 minutes. All the records are reviewed and case discussed with Care Management/Social Workerr. Management plans discussed with the patient, family and they are in agreement.  CODE STATUS: Full.  TOTAL TIME TAKING CARE OF THIS PATIENT: 25 minutes.    POSSIBLE D/C IN 1-2 DAYS, DEPENDING ON CLINICAL CONDITION.   Shaune Pollack M.D on 01/20/2018   Between 7am to 6pm - Pager - [phone]  After 6pm go to [email or web address] - password [email or web address] [facility]  [facility] [place] [facility]  Office  [phone]  CC: Primary care physician; Lyndon Code, MD  Note: This dictation was prepared with Dragon dictation along with smaller phrase technology. Any transcriptional errors that result from this process are unintentional.

## 2018-01-21 DIAGNOSIS — Z23 Encounter for immunization: Secondary | ICD-10-CM | POA: Diagnosis not present

## 2018-01-21 MED ORDER — BACLOFEN 5 MG PO TABS: 5.0000 mg | ORAL_TABLET | Freq: Two times a day (BID) | ORAL | 0 refills | Status: DC | PRN

## 2018-01-21 MED ORDER — PREDNISONE 20 MG PO TABS: 40.0000 mg | ORAL_TABLET | Freq: Every day | ORAL | 0 refills | Status: DC

## 2018-01-21 MED ORDER — GUAIFENESIN-DM 100-10 MG/5ML PO SYRP: 5.0000 mL | ORAL_SOLUTION | ORAL | 0 refills | Status: DC | PRN

## 2018-01-21 MED ORDER — FUROSEMIDE 20 MG PO TABS
20.0000 mg | ORAL_TABLET | Freq: Once | ORAL | Status: AC
  Administered 2018-01-21: 20 mg via ORAL
  Filled 2018-01-21: qty 1

## 2018-01-21 NOTE — Discharge Instructions (Signed)
Continue home O2 [place] 2-3 Continue IS and flutter valve, at least 4 times an hour while awake.  Smoking cessation.

## 2018-01-21 NOTE — Progress Notes (Signed)
Pt has been discharged home with [facility]. Discharge papers given and explained to pt, verbalized understanding. Meds and f/u appointments reviewed. RX given.

## 2018-01-21 NOTE — Care Management Important Message (Signed)
Copy of signed IM left in patient's room.    

## 2018-01-21 NOTE — Care Management (Signed)
Discharge to home today per Dr. Imogene Burn. Will be followed by [facility]. Nursing services per [facility] Gwenette Greet RN MSN CCM Care Management [phone]

## 2018-01-21 NOTE — Discharge Summary (Signed)
[employer] [facility] - [place] at [facility]   PATIENT NAME: Dustin [name]    MR#:  [id number]  DATE OF BIRTH:  1956/08/05  DATE OF ADMISSION:  01/15/2018   ADMITTING PHYSICIAN: Enid Baas, MD  DATE OF DISCHARGE: 01/21/2018  PRIMARY CARE PHYSICIAN: Lyndon Code, MD   ADMISSION DIAGNOSIS:  COPD exacerbation  DISCHARGE DIAGNOSIS:  Active Problems:   COPD exacerbation (HCC)  SECONDARY DIAGNOSIS:   Past Medical History:  Diagnosis Date  . Allergy   . Anxiety   . Anxiety   . Asthma   . Chronic diastolic CHF (congestive heart failure) (HCC)    a. echo 07/2013: EF 60-65%, DD, biatrial dilatation, Ao sclerosis, dilated RV, moderate pulmonary HTN, elevated CV and RA pressures; b. patient reported echo at Dr. Milta Deiters office 02/2015 - his office does not have record of him being a pt there c. echo 11/2015: EF 60-65%, Grade 1 DD, mod-severe pulm pressures  . Chronic respiratory failure (HCC)    a. on 2L via nasal cannula; b. secondary to COPD  . COPD (chronic obstructive pulmonary disease) (HCC)   . Depression   . Depression   . Depression   . Emphysema of lung (HCC)   . GERD (gastroesophageal reflux disease)   . Hypertension   . Personal history of tobacco use, presenting hazards to health 08/17/2015  . Tobacco abuse    HOSPITAL COURSE:   DennisWinburnis a62 y.o.malewith a known history of chronic respiratory failure secondary to COPD on 2 L home oxygen, chronic diastolic heart failure, depression anxiety, neuropathy presents from pulmonary office secondary to worsening shortness of breath and productive sputum.  1.Acute on chronic COPD exacerbation-failed outpatient prednisone taper. -On 2 L oxygen.  Discontinued IV steroids, changed to prednisone, continue duo nebs -Chest x-ray is showing chronic bullous changes, no pneumonia. Since productive cough, sent sputum cultures: normal respiratory flora. Completed Rocephin and azithromycin -Pulmonary  critical care consult appreciated - encouraged to use flutter valve and incentive spirometer. - Added Pulmicort.  2. Acute on chronic diastolic CHF exacerbation-with ankle swelling He was on IV Lasix, low-salt diet -Daily weights and monitor intake and output - Had > 2 ltr negative balance, Stop lasix as renal func slightly worse.  renal func stable now. Resumed lasix.  3. Neuropathy-continue home medications.  4. Anxiety and depression-continue home medications.  5. DVT prophylaxis-Lovenox.  Tobacco use. Smoking cessation was counseled for 4 minutes. DISCHARGE CONDITIONS:  Stable, discharge to home with home health and PT today. CONSULTS OBTAINED:  Treatment Team:  Shaune Pollack, MD DRUG ALLERGIES:   Allergies  Allergen Reactions  . Asa [Aspirin] Other (See Comments)    Reaction: swelling of the right side.  . Bee Venom Swelling  . Codeine Other (See Comments)    Reaction: Difficulty breathing  . Ibuprofen Other (See Comments)    Reaction: Swelling of the right side.  . Iodinated Diagnostic Agents Other (See Comments)    Reaction:  Unknown  Other reaction(s): Unknown   DISCHARGE MEDICATIONS:   Allergies as of 01/21/2018      Reactions   Asa [aspirin] Other (See Comments)   Reaction: swelling of the right side.   Bee Venom Swelling   Codeine Other (See Comments)   Reaction: Difficulty breathing   Ibuprofen Other (See Comments)   Reaction: Swelling of the right side.   Iodinated Diagnostic Agents Other (See Comments)   Reaction:  Unknown  Other reaction(s): Unknown      Medication List  STOP taking these medications   guaiFENesin 100 MG/5ML liquid Commonly known as:  ROBITUSSIN     TAKE these medications   Albuterol Sulfate 108 (90 Base) MCG/ACT Aepb Commonly known as:  PROAIR RESPICLICK Inhale 2 puffs into the lungs every 4 (four) hours as needed (for shortness of breath). Reported on 12/10/2015   ALPRAZolam 1 MG tablet Commonly known as:   XANAX Take 1 mg by mouth 3 (three) times daily as needed.   Baclofen 5 MG Tabs Take 5 mg by mouth 2 (two) times daily as needed for muscle spasms.   escitalopram 20 MG tablet Commonly known as:  LEXAPRO Take 20 mg by mouth at bedtime.   furosemide 20 MG tablet Commonly known as:  LASIX Take 1 tablet (20 mg total) by mouth daily.   gabapentin 600 MG tablet Commonly known as:  NEURONTIN Take 0.5 tablets (300 mg total) by mouth at bedtime.   gabapentin 300 MG capsule Commonly known as:  NEURONTIN TAKE 1 TO 2 CAPSULES BY MOUTH THREE TIMES DAILY   guaiFENesin-dextromethorphan 100-10 MG/5ML syrup Commonly known as:  ROBITUSSIN DM Take 5 mLs by mouth every 4 (four) hours as needed for cough.   ipratropium-albuterol 0.5-2.5 (3) MG/3ML Soln Commonly known as:  DUONEB Take 3 mLs by nebulization 4 (four) times daily as needed (for shortness of breath).   magnesium oxide 400 MG tablet Commonly known as:  MAG-OX Take 400 mg by mouth daily.   mometasone-formoterol 200-5 MCG/ACT Aero Commonly known as:  DULERA Inhale 2 puffs into the lungs 2 (two) times daily.   OPTICHAMBER ADVANTAGE-LG MASK Misc Use as directed with inhaler diag  j44.1   pantoprazole 40 MG tablet Commonly known as:  PROTONIX Take 1 tablet (40 mg total) by mouth daily at 12 noon.   potassium chloride 10 MEQ tablet Commonly known as:  K-DUR,KLOR-CON Take 10 mEq by mouth daily. Repeat if second LASIX taken   predniSONE 20 MG tablet Commonly known as:  DELTASONE Take 2 tablets (40 mg total) by mouth daily with breakfast.   rOPINIRole 0.25 MG tablet Commonly known as:  REQUIP TAKE 1 TABLET BY MOUTH TWICE DAILY FOR CRAMPS   theophylline 400 MG 24 hr tablet Commonly known as:  UNIPHYL Take 400 mg by mouth every morning.   tiotropium 18 MCG inhalation capsule Commonly known as:  SPIRIVA HANDIHALER Place 1 capsule (18 mcg total) into inhaler and inhale daily. What changed:    how much to take  when to  take this        DISCHARGE INSTRUCTIONS:  See AVS.  If you experience worsening of your admission symptoms, develop shortness of breath, life threatening emergency, suicidal or homicidal thoughts you must seek medical attention immediately by calling 911 or calling your MD immediately  if symptoms less severe.  You Must read complete instructions/literature along with all the possible adverse reactions/side effects for all the Medicines you take and that have been prescribed to you. Take any new Medicines after you have completely understood and accpet all the possible adverse reactions/side effects.   Please note  You were cared for by a hospitalist during your hospital stay. If you have any questions about your discharge medications or the care you received while you were in the hospital after you are discharged, you can call the unit and asked to speak with the hospitalist on call if the hospitalist that took care of you is not available. Once you are discharged, your primary care physician will  handle any further medical issues. Please note that NO REFILLS for any discharge medications will be authorized once you are discharged, as it is imperative that you return to your primary care physician (or establish a relationship with a primary care physician if you do not have one) for your aftercare needs so that they can reassess your need for medications and monitor your lab values.    On the day of Discharge:  VITAL SIGNS:  Blood pressure (!) 144/98, pulse (!) 107, temperature 98.2 F (36.8 C), temperature source Oral, resp. rate 20, height  (1.803 m), weight 211 lb 12.8 oz (96.1 kg), SpO2 90 %. PHYSICAL EXAMINATION:  GENERAL:  62 y.o.-year-old patient lying in the bed with no acute distress.  EYES: Pupils equal, round, reactive to light and accommodation. No scleral icterus. Extraocular muscles intact.  HEENT: Head atraumatic, normocephalic. Oropharynx and nasopharynx clear.   NECK:  Supple, no jugular venous distention. No thyroid enlargement, no tenderness.  LUNGS: diminished breath sounds bilaterally, no wheezing, rales,rhonchi or crepitation. No use of accessory muscles of respiration.  CARDIOVASCULAR: S1, S2 normal. No murmurs, rubs, or gallops.  ABDOMEN: Soft, non-tender, non-distended. Bowel sounds present. No organomegaly or mass.  EXTREMITIES: No pedal edema, cyanosis, or clubbing.  NEUROLOGIC: Cranial nerves II through XII are intact. Muscle strength 5/5 in all extremities. Sensation intact. Gait not checked.  PSYCHIATRIC: The patient is alert and oriented x 3.  SKIN: No obvious rash, lesion, or ulcer.  DATA REVIEW:   CBC Recent Labs  Lab 01/16/18 0533  WBC 12.8*  HGB 13.9  HCT 42.7  PLT 182    Chemistries  Recent Labs  Lab 01/15/18 1139  01/20/18 0340  NA 140   < > 140  K 3.9   < > 4.3  CL 97*   < > 98*  CO2 36*   < > 37*  GLUCOSE 93   < > 99  BUN 20   < > 34*  CREATININE 1.15   < > 1.05  CALCIUM 8.8*   < > 8.7*  MG  --    < > 2.3  AST 17  --   --   ALT 12*  --   --   ALKPHOS 52  --   --   BILITOT 1.2  --   --    < > = values in this interval not displayed.     Microbiology Results  Results for orders placed or performed during the hospital encounter of 01/15/18  Culture, expectorated sputum-assessment     Status: None   Collection Time: 01/15/18 11:43 AM  Result Value Ref Range Status   Specimen Description SPUTUM  Final   Special Requests Normal  Final   Sputum evaluation   Final    THIS SPECIMEN IS ACCEPTABLE FOR SPUTUM CULTURE Performed at [facility], [address], [place], [place] [address]    Report Status 01/15/2018 FINAL  Final  Culture, respiratory (NON-Expectorated)     Status: None   Collection Time: 01/15/18 11:43 AM  Result Value Ref Range Status   Specimen Description   Final    SPUTUM Performed at [facility], [address], [place], [place] [address]    Special Requests    Final    Normal Reflexed from [id number] Performed at [facility], [address] [place], [place] [address]    Gram Stain   Final    ABUNDANT WBC PRESENT,BOTH PMN AND MONONUCLEAR RARE GRAM POSITIVE COCCI  Culture   Final    FEW Consistent with normal respiratory flora. Performed at [facility], [address] [place], [place] [address]    Report Status 01/18/2018 FINAL  Final    RADIOLOGY:  No results found.   Management plans discussed with the patient, family and they are in agreement.  CODE STATUS: Full Code   TOTAL TIME TAKING CARE OF THIS PATIENT: 35 minutes.    Shaune Pollack M.D on 01/21/2018 at 3:18 PM  Between 7am to 6pm - Pager - [phone]  After 6pm go to [email or web address] - password [email or web address] [facility]  [facility] [place] [facility]  Office  [phone]  CC: Primary care physician; Lyndon Code, MD   Note: This dictation was prepared with Dragon dictation along with smaller phrase technology. Any transcriptional errors that result from this process are unintentional.

## 2018-01-21 NOTE — Progress Notes (Signed)
[facility]  Pulmonary Medicine     Assessment and Plan:    IMPRESSION:   Very advanced baseline COPD with acute exacerbation on chronic hypoxemic respiratory failure. Continued excess mucus production.   --Continue IS and flutter valve, at least 4 times an hour while awake.  --Wean steroids and oxygen as tolerated.  --Smoking cessation.    Date: 01/21/2018  MRN# [id number] Dustin [name] 23-Jul-1956    HPI:  Patient was admitted to the hospital  for acute COPD exacerbation from our office. Currently the patient remains on prednisone 40 mg daily.   Sputum culture 01/15/2018; results consistent with normal respiratory flora Imaging personally reviewed, chest x-ray 01/15/2018; hyperinflation, chronic bibasilar interstitial markings.  Medication:    Current Facility-Administered Medications:  .  acetaminophen (TYLENOL) tablet 650 mg, 650 mg, Oral, Q6H PRN **OR** acetaminophen (TYLENOL) suppository 650 mg, 650 mg, Rectal, Q6H PRN, Enid Baas, MD .  ALPRAZolam Prudy Feeler) tablet 1 mg, 1 mg, Oral, TID PRN, Enid Baas, MD, 1 mg at 01/21/18 1478 .  baclofen (LIORESAL) tablet 5 mg, 5 mg, Oral, BID PRN, Cammy Copa, MD, 5 mg at 01/20/18 2241 .  budesonide (PULMICORT) nebulizer solution 0.5 mg, 0.5 mg, Nebulization, BID, Altamese Dilling, MD, 0.5 mg at 01/21/18 0742 .  docusate sodium (COLACE) capsule 100 mg, 100 mg, Oral, BID, Nemiah Commander, Radhika, MD, 100 mg at 01/18/18 0849 .  enoxaparin (LOVENOX) injection 40 mg, 40 mg, Subcutaneous, Daily, Nemiah Commander, Radhika, MD, 40 mg at 01/20/18 1019 .  escitalopram (LEXAPRO) tablet 20 mg, 20 mg, Oral, QHS, Kalisetti, Radhika, MD, 20 mg at 01/20/18 2150 .  gabapentin (NEURONTIN) capsule 300 mg, 300 mg, Oral, TID, Altamese Dilling, MD, 300 mg at 01/21/18 1138 .  guaiFENesin-dextromethorphan (ROBITUSSIN DM) 100-10 MG/5ML syrup 5 mL, 5 mL, Oral, Q4H PRN, Altamese Dilling, MD, 5 mL at 01/20/18 2242 .  ipratropium-albuterol  (DUONEB) 0.5-2.5 (3) MG/3ML nebulizer solution 3 mL, 3 mL, Nebulization, Q6H, Shaune Pollack, MD, 3 mL at 01/21/18 0742 .  MEDLINE mouth rinse, 15 mL, Mouth Rinse, BID, Kalisetti, Radhika, MD, 15 mL at 01/21/18 1139 .  ondansetron (ZOFRAN) tablet 4 mg, 4 mg, Oral, Q6H PRN **OR** ondansetron (ZOFRAN) injection 4 mg, 4 mg, Intravenous, Q6H PRN, Nemiah Commander, Radhika, MD .  pantoprazole (PROTONIX) EC tablet 40 mg, 40 mg, Oral, Q1200, Enid Baas, MD, 40 mg at 01/21/18 1137 .  polyethylene glycol (MIRALAX / GLYCOLAX) packet 17 g, 17 g, Oral, Daily PRN, Nemiah Commander, Radhika, MD .  potassium chloride (K-DUR,KLOR-CON) CR tablet 10 mEq, 10 mEq, Oral, Daily, Altamese Dilling, MD, 10 mEq at 01/21/18 1138 .  predniSONE (DELTASONE) tablet 40 mg, 40 mg, Oral, Q breakfast, Shaune Pollack, MD, 40 mg at 01/21/18 1138 .  pseudoephedrine (SUDAFED) 12 hr tablet 120 mg, 120 mg, Oral, BID, Shane Crutch, MD, 120 mg at 01/21/18 1141 .  rOPINIRole (REQUIP) tablet 0.25 mg, 0.25 mg, Oral, BID, Nemiah Commander, Radhika, MD, 0.25 mg at 01/21/18 1139 .  saccharomyces boulardii (FLORASTOR) capsule 250 mg, 250 mg, Oral, BID, Oralia Manis, MD, 250 mg at 01/21/18 0520 .  sodium chloride flush (NS) 0.9 % injection 10-40 mL, 10-40 mL, Intracatheter, PRN, Altamese Dilling, MD .  theophylline (UNIPHYL) 400 MG 24 hr tablet 400 mg, 400 mg, Oral, Daily, Nemiah Commander, Radhika, MD, 400 mg at 01/21/18 1141   Allergies:  Asa [aspirin]; Bee venom; Codeine; Ibuprofen; and Iodinated diagnostic agents  Review of Systems: Gen:  Denies  fever, sweats. HEENT: Denies blurred vision. Cvc:  No dizziness, chest pain or heaviness  Resp:   Denies cough or sputum porduction. Gi: Denies swallowing difficulty, stomach pain. constipation, bowel incontinence Gu:  Denies bladder incontinence, burning urine Ext:   No Joint pain, stiffness. Skin: No skin rash, easy bruising. Endoc:  No polyuria, polydipsia. Psych: No depression, insomnia. Other:   All other systems were reviewed and found to be negative other than what is mentioned in the HPI.   Physical Examination:   VS: BP (!) 144/98 (BP Location: Right Arm)   Pulse (!) 107   Temp 98.2 F (36.8 C) (Oral)   Resp 20   Ht  (1.803 m)   Wt 211 lb 12.8 oz (96.1 kg)   SpO2 92%   BMI 29.54 kg/m    General Appearance: No distress  Neuro:without focal findings,  speech normal,  HEENT: PERRLA, EOM intact. Pulmonary: decreased air entry bilaterally, scattered wheezing.  CardiovascularNormal S1,S2.  No m/r/g.   Abdomen: Benign, Soft, non-tender. Renal:  No costovertebral tenderness  GU:  Not performed at this time. Endoc: No evident thyromegaly, no signs of acromegaly. Skin:   warm, no rash. Extremities: normal, no cyanosis, clubbing.   LABORATORY PANEL:   CBC Recent Labs  Lab 01/16/18 0533  WBC 12.8*  HGB 13.9  HCT 42.7  PLT 182   ------------------------------------------------------------------------------------------------------------------  Chemistries  Recent Labs  Lab 01/15/18 1139  01/20/18 0340  NA 140   < > 140  K 3.9   < > 4.3  CL 97*   < > 98*  CO2 36*   < > 37*  GLUCOSE 93   < > 99  BUN 20   < > 34*  CREATININE 1.15   < > 1.05  CALCIUM 8.8*   < > 8.7*  MG  --    < > 2.3  AST 17  --   --   ALT 12*  --   --   ALKPHOS 52  --   --   BILITOT 1.2  --   --    < > = values in this interval not displayed.   ------------------------------------------------------------------------------------------------------------------  Cardiac Enzymes No results for input(s): TROPONINI in the last 168 hours. ------------------------------------------------------------  RADIOLOGY:   No results found for this or any previous visit. Results for orders placed during the hospital encounter of 01/15/18  DG Chest 2 View   Narrative CLINICAL DATA:  Shortness of breath, COPD  EXAM: CHEST - 2 VIEW  COMPARISON:  11/22/2017  FINDINGS: COPD changes with  hyperinflation and bullous disease in the upper lobes. Increased lung markings in the lower lobes have improved since prior study, likely representing improving edema. No effusions. Heart is normal size. No acute bony abnormality.  IMPRESSION: Improving pulmonary edema pattern.  Bullous emphysematous changes.   Electronically Signed   By: Charlett Nose M.D.   On: 01/15/2018 11:04    ------------------------------------------------------------------------------------------------------------------  Thank  you for allowing [facility] to assist in the care of your patient. Our recommendations are noted above.  Please contact us if we can be of further service.   Wells Guiles, MD.  [place] [facility] Office Number: [phone]  Santiago Glad, M.D.  Billy Fischer, M.D  01/21/2018

## 2018-01-22 ENCOUNTER — Other Ambulatory Visit: Payer: Self-pay | Admitting: *Deleted

## 2018-01-22 ENCOUNTER — Telehealth: Payer: Self-pay

## 2018-01-22 ENCOUNTER — Encounter: Payer: Self-pay | Admitting: Emergency Medicine

## 2018-01-22 ENCOUNTER — Inpatient Hospital Stay
Admission: EM | Admit: 2018-01-22 | Discharge: 2018-01-28 | DRG: 193 | Disposition: A | Discharge status: Home Health | Payer: Medicare Other | Attending: Internal Medicine | Admitting: Internal Medicine

## 2018-01-22 ENCOUNTER — Emergency Department: Payer: Medicare Other

## 2018-01-22 ENCOUNTER — Other Ambulatory Visit: Payer: Self-pay

## 2018-01-22 DIAGNOSIS — G629 Polyneuropathy, unspecified: Secondary | ICD-10-CM | POA: Diagnosis present

## 2018-01-22 DIAGNOSIS — Z91030 Bee allergy status: Secondary | ICD-10-CM | POA: Diagnosis not present

## 2018-01-22 DIAGNOSIS — I248 Other forms of acute ischemic heart disease: Secondary | ICD-10-CM | POA: Diagnosis not present

## 2018-01-22 DIAGNOSIS — J9621 Acute and chronic respiratory failure with hypoxia: Secondary | ICD-10-CM | POA: Diagnosis present

## 2018-01-22 DIAGNOSIS — J9611 Chronic respiratory failure with hypoxia: Secondary | ICD-10-CM | POA: Diagnosis not present

## 2018-01-22 DIAGNOSIS — Z885 Allergy status to narcotic agent status: Secondary | ICD-10-CM

## 2018-01-22 DIAGNOSIS — I110 Hypertensive heart disease with heart failure: Secondary | ICD-10-CM | POA: Diagnosis not present

## 2018-01-22 DIAGNOSIS — J432 Centrilobular emphysema: Secondary | ICD-10-CM | POA: Diagnosis not present

## 2018-01-22 DIAGNOSIS — Z8249 Family history of ischemic heart disease and other diseases of the circulatory system: Secondary | ICD-10-CM | POA: Diagnosis not present

## 2018-01-22 DIAGNOSIS — N179 Acute kidney failure, unspecified: Secondary | ICD-10-CM | POA: Diagnosis present

## 2018-01-22 DIAGNOSIS — Z91041 Radiographic dye allergy status: Secondary | ICD-10-CM

## 2018-01-22 DIAGNOSIS — F17210 Nicotine dependence, cigarettes, uncomplicated: Secondary | ICD-10-CM | POA: Diagnosis present

## 2018-01-22 DIAGNOSIS — Z825 Family history of asthma and other chronic lower respiratory diseases: Secondary | ICD-10-CM

## 2018-01-22 DIAGNOSIS — I5033 Acute on chronic diastolic (congestive) heart failure: Secondary | ICD-10-CM | POA: Diagnosis not present

## 2018-01-22 DIAGNOSIS — K219 Gastro-esophageal reflux disease without esophagitis: Secondary | ICD-10-CM | POA: Diagnosis not present

## 2018-01-22 DIAGNOSIS — J189 Pneumonia, unspecified organism: Secondary | ICD-10-CM | POA: Diagnosis present

## 2018-01-22 DIAGNOSIS — R05 Cough: Secondary | ICD-10-CM

## 2018-01-22 DIAGNOSIS — I5032 Chronic diastolic (congestive) heart failure: Secondary | ICD-10-CM | POA: Diagnosis present

## 2018-01-22 DIAGNOSIS — Z886 Allergy status to analgesic agent status: Secondary | ICD-10-CM

## 2018-01-22 DIAGNOSIS — J441 Chronic obstructive pulmonary disease with (acute) exacerbation: Secondary | ICD-10-CM

## 2018-01-22 DIAGNOSIS — I214 Non-ST elevation (NSTEMI) myocardial infarction: Secondary | ICD-10-CM | POA: Diagnosis not present

## 2018-01-22 DIAGNOSIS — J9601 Acute respiratory failure with hypoxia: Secondary | ICD-10-CM | POA: Diagnosis not present

## 2018-01-22 DIAGNOSIS — Z7951 Long term (current) use of inhaled steroids: Secondary | ICD-10-CM

## 2018-01-22 DIAGNOSIS — R0602 Shortness of breath: Secondary | ICD-10-CM | POA: Diagnosis not present

## 2018-01-22 DIAGNOSIS — Z7952 Long term (current) use of systemic steroids: Secondary | ICD-10-CM

## 2018-01-22 DIAGNOSIS — J181 Lobar pneumonia, unspecified organism: Principal | ICD-10-CM | POA: Diagnosis present

## 2018-01-22 DIAGNOSIS — Y95 Nosocomial condition: Secondary | ICD-10-CM | POA: Diagnosis present

## 2018-01-22 DIAGNOSIS — Z9981 Dependence on supplemental oxygen: Secondary | ICD-10-CM | POA: Diagnosis not present

## 2018-01-22 DIAGNOSIS — J45909 Unspecified asthma, uncomplicated: Secondary | ICD-10-CM | POA: Diagnosis not present

## 2018-01-22 DIAGNOSIS — Z79899 Other long term (current) drug therapy: Secondary | ICD-10-CM

## 2018-01-22 DIAGNOSIS — R059 Cough, unspecified: Secondary | ICD-10-CM

## 2018-01-22 DIAGNOSIS — J439 Emphysema, unspecified: Secondary | ICD-10-CM | POA: Diagnosis not present

## 2018-01-22 DIAGNOSIS — F419 Anxiety disorder, unspecified: Secondary | ICD-10-CM | POA: Diagnosis present

## 2018-01-22 DIAGNOSIS — R748 Abnormal levels of other serum enzymes: Secondary | ICD-10-CM | POA: Diagnosis not present

## 2018-01-22 DIAGNOSIS — Z8701 Personal history of pneumonia (recurrent): Secondary | ICD-10-CM

## 2018-01-22 LAB — CBC
HCT: 50.6 % (ref 40.0–52.0)
Hemoglobin: 16.5 g/dL (ref 13.0–18.0)
MCH: 31.6 pg (ref 26.0–34.0)
MCHC: 32.7 g/dL (ref 32.0–36.0)
MCV: 96.7 fL (ref 80.0–100.0)
PLATELETS: 168 10*3/uL (ref 150–440)
RBC: 5.23 MIL/uL (ref 4.40–5.90)
RDW: 15.5 % — AB (ref 11.5–14.5)
WBC: 18.5 10*3/uL — ABNORMAL HIGH (ref 3.8–10.6)

## 2018-01-22 LAB — BASIC METABOLIC PANEL
Anion gap: 10 (ref 5–15)
BUN: 45 mg/dL — AB (ref 6–20)
CALCIUM: 9.2 mg/dL (ref 8.9–10.3)
CO2: 30 mmol/L (ref 22–32)
CREATININE: 1.33 mg/dL — AB (ref 0.61–1.24)
Chloride: 100 mmol/L — ABNORMAL LOW (ref 101–111)
GFR calc Af Amer: >60 mL/min (ref >60)
GFR, EST NON AFRICAN AMERICAN: 55 mL/min — AB (ref >60)
Glucose, Bld: 179 mg/dL — ABNORMAL HIGH (ref 65–99)
Potassium: 4.6 mmol/L (ref 3.5–5.1)
SODIUM: 140 mmol/L (ref 135–145)

## 2018-01-22 LAB — TROPONIN I: Troponin I: 0.03 ng/mL (ref <0.03)

## 2018-01-22 MED ORDER — IPRATROPIUM-ALBUTEROL 0.5-2.5 (3) MG/3ML IN SOLN
3.0000 mL | Freq: Once | RESPIRATORY_TRACT | Status: AC
  Administered 2018-01-22: 3 mL via RESPIRATORY_TRACT

## 2018-01-22 MED ORDER — IPRATROPIUM-ALBUTEROL 0.5-2.5 (3) MG/3ML IN SOLN
RESPIRATORY_TRACT | Status: AC
  Filled 2018-01-22: qty 6

## 2018-01-22 MED ORDER — SODIUM CHLORIDE 0.9 % IV SOLN
2.0000 g | Freq: Once | INTRAVENOUS | Status: AC
  Administered 2018-01-23: 2 g via INTRAVENOUS
  Filled 2018-01-22: qty 2

## 2018-01-22 NOTE — ED Notes (Signed)
Date and time results received: 01/22/18 2037   Test: troponin  Critical Value: 0.03  Name of Provider Notified: Dr. Karen Kays

## 2018-01-22 NOTE — ED Triage Notes (Signed)
Patient states that the was sent here by his home health nurse. Patient states that when she checked his oxygen level his sats were 88%. Patient states that he was just discharged from the hospital yesterday for CHF. Patient wears O2 at 2l and sats 90% on 2l in triage.

## 2018-01-22 NOTE — ED Notes (Signed)
Report off to rebecca rn 
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
## 2018-01-22 NOTE — Telephone Encounter (Signed)
[facility] called Dustin [name] his O2 is 19 with 2 litre and pt is anxious and depression as per dr Beverely Risen advised Dustin [name] that need to go go to ER if his O2 level is low and also gave verbal order for home health nursing 2 weeks for 2 times and 1 weeks for once a week

## 2018-01-22 NOTE — ED Notes (Signed)
Pt reports sob since this afternoon.  Pt was discharged from [facility] yesterday. Pt denies chest pain .  Skin warm and dry.  No n/v/  Pt alert.  Iv started and pt placed on monitor.  Sinus tach at 120.

## 2018-01-22 NOTE — Patient Outreach (Signed)
[name]) Care Management  01/22/2018  Dustin [name] 04/21/1956 [id number]  Transition of Care Outreach Call #1:   Dustin Ran Winburnis an 62 y.o.gentleman with past medical history which includes COPD (Gold IV Classificaton), asthma, hypertension, anxiety and depression, and GERD who has had 4 inpatient hospital admissions in the last 6 months, all for respiratory ailments. Dustin [name] was hospitalized from 11/22/17-11/29/17 and was intubated 03/07 and extubated 03/10 during that hospitalization. He was treated as an outpatient by Dr. Billy [name] with oral steroids and on his followup visit was re-admitted to the hospital on 01/15/18 for COPD exacerbation by Dr. Sung [name], treated with antibiotics and IV steroids and also treated for acute/chronic CHF then discharged to home on 01/21/18. I spoke with Dustin [name] today by phone to re-start transition of care services.   COPD Management- Dustin [name] tells me today he feels better than he did the last time he was discharged from the hospital and particularly says he has few secretions and feels he can more easily clear his own secretions. Dustin [name] is taking his medications as prescribed. His home health nurse is scheduled to visit again sometime over the next 2 days. He is using his home O2 @ 2l/[place] continuously.    CHF Management - Dustin [name] was treated with IV Lasix during this hospitalization and diuresed of > 2l before his diuretic was held in light of slightly worsening renal function. His oral, pre-hospitalization dose of Lasix was resumed at discharge and he is taking it as prescribed. Dustin [name] is scheduled for follow up in the heart failure clinic at [facility]. He is weighing daily and recording as advised.   Provider Appointments/Follow Up:  Clarisa [name] NP 01/25/18 10:40am (HF Clinic)  Eula Listen PA 01/28/18 3:30pm (EKG) Liz Beach 01/30/18 11:30am (Primary Care - post hospital follow up) Billy Fischer MD 02/04/18 11:20am (Pulmonary) French Ana McLean-Scocuzza MD 02/26/18 1:30pm (new primary care)  Plan: I will follow up with Dustin [name] by phone next week for continued transition of care services.   Dustin [name] will work with his home health team and will attend all scheduled provider appointments.   Dustin [name] will call his provider or 911 as appropriate for new or worsening symptoms.   [facility] CM Care Plan Problem One     Most Recent Value  Care Plan Problem One  Knowledge Deficits related to changes in pulmonary plan of care  Role Documenting the Problem One  Care Management Coordinator  Care Plan for Problem One  Active  [facility] Long Term Goal   Over the next 31 days, patient will verbalize and demonstrate knowledge of changes to plan of care for management of pulmonary disease as outlined by new pulmonnary provider  [facility] Long Term Goal Start Date  12/19/17  Interventions for Problem One Long Term Goal  transition of care assessment,  review of AVs and plan of care  [facility] CM Short Term Goal #1   Over the next 30 days, patient will report new or worsened symptoms to pulmonary or prmary care or 911 as appropriate  [facility] CM Short Term Goal #1 Start Date  01/22/18  Interventions for Short Term Goal #1  reviewed signs and symptoms of worsening pulmonary status and when to call which provider for help  [facility] CM Short Term Goal #2   Over the next 30 days, patient will take all medications including inhalers as prescribed   [facility] CM Short Term Goal #2 Start  Date  12/19/17  Interventions for Short Term Goal #2  medications reviewed    [facility] CM Care Plan Problem Two     Most Recent Value  Care Plan Problem Two  Financial Barriers related to Medication Mangaement  Role Documenting the Problem Two  Care Management Coordinator  Care Plan for Problem Two  Active  Interventions for Problem Two Long Term Goal   medications reviewed,  discussied provider follow up re: Medication management  [facility] Long Term Goal   Over the next 31 days, patient will verbalize understanding of assistance available for inhalers ordered   [facility] Long Term Goal Start Date  12/19/17  [facility] CM Short Term Goal #1   Over the next 7 days, patient will verbalize receipt of status of patient assistance applicaitons from PCP office  [facility] CM Short Term Goal #1 Start Date  12/19/17    [facility] CM Care Plan Problem Three     Most Recent Value  Care Plan Problem Three  Long Term Housing Needs  Role Documenting the Problem Three  Care Management Coordinator  Care Plan for Problem Three  Active  [facility] Long Term Goal   Over the next 31 days, patient will verbalize understanding of options available for short and long term housing solutions  [facility] Long Term Goal Start Date  01/10/18  Interventions for Problem Three Long Term Goal  [name] apprised of patient hospitalization and status  [facility] CM Short Term Goal #1   Over the next 7 days, patient will work with [name] to review housing options  [facility] CM Short Term Goal #1 Start Date  01/10/18  Interventions for Short Term Goal #1  collaboration about patient status with [name]      Marja Kays [facility] Care Management  [phone]

## 2018-01-22 NOTE — Patient Outreach (Signed)
[name]) Care Management  01/22/2018  Dustin [name] 23-May-1956 [id number]   Social work referral received on 01/11/18 to assist patient with housing resources.  BSW made first outreach attempt.  Left message for patient.  BSW will make second outreach within 3-4 business days.  Unsuccessful outreach letter mailed.  [name], BSW Social Worker [phone]

## 2018-01-23 ENCOUNTER — Encounter: Payer: Self-pay | Admitting: *Deleted

## 2018-01-23 ENCOUNTER — Other Ambulatory Visit: Payer: Self-pay

## 2018-01-23 DIAGNOSIS — R0602 Shortness of breath: Secondary | ICD-10-CM | POA: Diagnosis present

## 2018-01-23 DIAGNOSIS — G629 Polyneuropathy, unspecified: Secondary | ICD-10-CM | POA: Diagnosis present

## 2018-01-23 DIAGNOSIS — I110 Hypertensive heart disease with heart failure: Secondary | ICD-10-CM | POA: Diagnosis present

## 2018-01-23 DIAGNOSIS — J9621 Acute and chronic respiratory failure with hypoxia: Secondary | ICD-10-CM | POA: Diagnosis present

## 2018-01-23 DIAGNOSIS — N179 Acute kidney failure, unspecified: Secondary | ICD-10-CM | POA: Diagnosis present

## 2018-01-23 DIAGNOSIS — J189 Pneumonia, unspecified organism: Secondary | ICD-10-CM | POA: Diagnosis not present

## 2018-01-23 DIAGNOSIS — I5032 Chronic diastolic (congestive) heart failure: Secondary | ICD-10-CM | POA: Diagnosis present

## 2018-01-23 DIAGNOSIS — J181 Lobar pneumonia, unspecified organism: Secondary | ICD-10-CM | POA: Diagnosis present

## 2018-01-23 DIAGNOSIS — F17210 Nicotine dependence, cigarettes, uncomplicated: Secondary | ICD-10-CM | POA: Diagnosis present

## 2018-01-23 DIAGNOSIS — Z885 Allergy status to narcotic agent status: Secondary | ICD-10-CM | POA: Diagnosis not present

## 2018-01-23 DIAGNOSIS — Z8249 Family history of ischemic heart disease and other diseases of the circulatory system: Secondary | ICD-10-CM | POA: Diagnosis not present

## 2018-01-23 DIAGNOSIS — I248 Other forms of acute ischemic heart disease: Secondary | ICD-10-CM | POA: Diagnosis present

## 2018-01-23 DIAGNOSIS — Z91030 Bee allergy status: Secondary | ICD-10-CM | POA: Diagnosis not present

## 2018-01-23 DIAGNOSIS — Z91041 Radiographic dye allergy status: Secondary | ICD-10-CM | POA: Diagnosis not present

## 2018-01-23 DIAGNOSIS — Y95 Nosocomial condition: Secondary | ICD-10-CM | POA: Diagnosis present

## 2018-01-23 DIAGNOSIS — F419 Anxiety disorder, unspecified: Secondary | ICD-10-CM | POA: Diagnosis present

## 2018-01-23 DIAGNOSIS — Z79899 Other long term (current) drug therapy: Secondary | ICD-10-CM | POA: Diagnosis not present

## 2018-01-23 DIAGNOSIS — Z886 Allergy status to analgesic agent status: Secondary | ICD-10-CM | POA: Diagnosis not present

## 2018-01-23 DIAGNOSIS — Z7952 Long term (current) use of systemic steroids: Secondary | ICD-10-CM | POA: Diagnosis not present

## 2018-01-23 DIAGNOSIS — Z9981 Dependence on supplemental oxygen: Secondary | ICD-10-CM | POA: Diagnosis not present

## 2018-01-23 DIAGNOSIS — Z825 Family history of asthma and other chronic lower respiratory diseases: Secondary | ICD-10-CM | POA: Diagnosis not present

## 2018-01-23 DIAGNOSIS — Z8701 Personal history of pneumonia (recurrent): Secondary | ICD-10-CM | POA: Diagnosis not present

## 2018-01-23 DIAGNOSIS — J441 Chronic obstructive pulmonary disease with (acute) exacerbation: Secondary | ICD-10-CM | POA: Diagnosis not present

## 2018-01-23 DIAGNOSIS — J432 Centrilobular emphysema: Secondary | ICD-10-CM | POA: Diagnosis present

## 2018-01-23 DIAGNOSIS — Z7951 Long term (current) use of inhaled steroids: Secondary | ICD-10-CM | POA: Diagnosis not present

## 2018-01-23 LAB — STREP PNEUMONIAE URINARY ANTIGEN: STREP PNEUMO URINARY ANTIGEN: NEGATIVE

## 2018-01-23 LAB — EXPECTORATED SPUTUM ASSESSMENT W GRAM STAIN, RFLX TO RESP C

## 2018-01-23 LAB — EXPECTORATED SPUTUM ASSESSMENT W REFEX TO RESP CULTURE

## 2018-01-23 LAB — PROCALCITONIN: Procalcitonin: 0.12 ng/mL

## 2018-01-23 LAB — MRSA PCR SCREENING: MRSA by PCR: NEGATIVE

## 2018-01-23 LAB — TROPONIN I

## 2018-01-23 MED ORDER — METHYLPREDNISOLONE SODIUM SUCC 125 MG IJ SOLR
80.0000 mg | Freq: Two times a day (BID) | INTRAMUSCULAR | Status: DC
  Administered 2018-01-23 – 2018-01-24 (×4): 80 mg via INTRAVENOUS
  Filled 2018-01-23 (×4): qty 2

## 2018-01-23 MED ORDER — GUAIFENESIN-DM 100-10 MG/5ML PO SYRP
5.0000 mL | ORAL_SOLUTION | ORAL | Status: DC | PRN
  Administered 2018-01-23 – 2018-01-27 (×6): 5 mL via ORAL
  Filled 2018-01-23 (×7): qty 5

## 2018-01-23 MED ORDER — MAGNESIUM OXIDE 400 (241.3 MG) MG PO TABS
400.0000 mg | ORAL_TABLET | Freq: Every day | ORAL | Status: DC
  Administered 2018-01-23 – 2018-01-28 (×6): 400 mg via ORAL
  Filled 2018-01-23 (×6): qty 1

## 2018-01-23 MED ORDER — FUROSEMIDE 20 MG PO TABS
20.0000 mg | ORAL_TABLET | Freq: Every day | ORAL | Status: DC
  Administered 2018-01-23 – 2018-01-28 (×6): 20 mg via ORAL
  Filled 2018-01-23 (×6): qty 1

## 2018-01-23 MED ORDER — GABAPENTIN 300 MG PO CAPS
300.0000 mg | ORAL_CAPSULE | Freq: Three times a day (TID) | ORAL | Status: DC
  Administered 2018-01-23 – 2018-01-28 (×17): 300 mg via ORAL
  Filled 2018-01-23 (×17): qty 1

## 2018-01-23 MED ORDER — THEOPHYLLINE ER 400 MG PO TB24
400.0000 mg | ORAL_TABLET | ORAL | Status: DC
  Administered 2018-01-23 – 2018-01-28 (×6): 400 mg via ORAL
  Filled 2018-01-23 (×7): qty 1

## 2018-01-23 MED ORDER — VANCOMYCIN HCL 10 G IV SOLR
1250.0000 mg | Freq: Two times a day (BID) | INTRAVENOUS | Status: DC
  Administered 2018-01-23 – 2018-01-24 (×3): 1250 mg via INTRAVENOUS
  Filled 2018-01-23 (×4): qty 1250

## 2018-01-23 MED ORDER — ESCITALOPRAM OXALATE 10 MG PO TABS
20.0000 mg | ORAL_TABLET | Freq: Every day | ORAL | Status: DC
  Administered 2018-01-23 – 2018-01-27 (×6): 20 mg via ORAL
  Filled 2018-01-23 (×7): qty 2

## 2018-01-23 MED ORDER — ROPINIROLE HCL 0.25 MG PO TABS
0.2500 mg | ORAL_TABLET | Freq: Two times a day (BID) | ORAL | Status: DC
  Administered 2018-01-23 – 2018-01-28 (×11): 0.25 mg via ORAL
  Filled 2018-01-23 (×13): qty 1

## 2018-01-23 MED ORDER — ORAL CARE MOUTH RINSE
15.0000 mL | Freq: Two times a day (BID) | OROMUCOSAL | Status: DC
  Administered 2018-01-23 – 2018-01-28 (×7): 15 mL via OROMUCOSAL

## 2018-01-23 MED ORDER — VANCOMYCIN HCL IN DEXTROSE 1-5 GM/200ML-% IV SOLN
1000.0000 mg | INTRAVENOUS | Status: AC
  Administered 2018-01-23: 1000 mg via INTRAVENOUS
  Filled 2018-01-23: qty 200

## 2018-01-23 MED ORDER — HEPARIN SODIUM (PORCINE) 5000 UNIT/ML IJ SOLN
5000.0000 [IU] | Freq: Three times a day (TID) | INTRAMUSCULAR | Status: DC
  Administered 2018-01-23 – 2018-01-28 (×17): 5000 [IU] via SUBCUTANEOUS
  Filled 2018-01-23 (×17): qty 1

## 2018-01-23 MED ORDER — SODIUM CHLORIDE 0.9 % IV SOLN
2.0000 g | Freq: Two times a day (BID) | INTRAVENOUS | Status: DC
  Administered 2018-01-23 (×2): 2 g via INTRAVENOUS
  Filled 2018-01-23 (×4): qty 2

## 2018-01-23 MED ORDER — POTASSIUM CHLORIDE CRYS ER 10 MEQ PO TBCR
10.0000 meq | EXTENDED_RELEASE_TABLET | Freq: Every day | ORAL | Status: DC
  Administered 2018-01-23 – 2018-01-28 (×6): 10 meq via ORAL
  Filled 2018-01-23 (×6): qty 1

## 2018-01-23 MED ORDER — RISAQUAD PO CAPS
1.0000 | ORAL_CAPSULE | Freq: Two times a day (BID) | ORAL | Status: DC
  Administered 2018-01-23 – 2018-01-28 (×12): 1 via ORAL
  Filled 2018-01-23 (×12): qty 1

## 2018-01-23 MED ORDER — IPRATROPIUM-ALBUTEROL 0.5-2.5 (3) MG/3ML IN SOLN: 3.0000 mL | Freq: Four times a day (QID) | RESPIRATORY_TRACT | Status: DC | PRN

## 2018-01-23 MED ORDER — TIOTROPIUM BROMIDE MONOHYDRATE 18 MCG IN CAPS
18.0000 ug | ORAL_CAPSULE | Freq: Every day | RESPIRATORY_TRACT | Status: DC
  Administered 2018-01-23 – 2018-01-28 (×5): 18 ug via RESPIRATORY_TRACT
  Filled 2018-01-23: qty 5

## 2018-01-23 MED ORDER — ALPRAZOLAM 1 MG PO TABS
1.0000 mg | ORAL_TABLET | Freq: Three times a day (TID) | ORAL | Status: DC | PRN
  Administered 2018-01-23 – 2018-01-28 (×17): 1 mg via ORAL
  Filled 2018-01-23 (×17): qty 1

## 2018-01-23 MED ORDER — BACLOFEN 10 MG PO TABS
5.0000 mg | ORAL_TABLET | Freq: Two times a day (BID) | ORAL | Status: DC | PRN
  Administered 2018-01-23: 5 mg via ORAL
  Filled 2018-01-23: qty 0.5

## 2018-01-23 NOTE — Progress Notes (Signed)
[facility] [place] [facility]   PATIENT NAME: Dustin [name]    MR#:  [id number]  DATE OF BIRTH:  1956/02/10  SUBJECTIVE:  CHIEF COMPLAINT: Shortness of breath is slightly better but very anxious  REVIEW OF SYSTEMS:  CONSTITUTIONAL: No fever, fatigue or weakness.  EYES: No blurred or double vision.  EARS, NOSE, AND THROAT: No tinnitus or ear pain.  RESPIRATORY:  reports cough, shortness of breath, wheezing, denies hemoptysis.  CARDIOVASCULAR: No chest pain, orthopnea, edema.  GASTROINTESTINAL: No nausea, vomiting, diarrhea or abdominal pain.  GENITOURINARY: No dysuria, hematuria.  ENDOCRINE: No polyuria, nocturia,  HEMATOLOGY: No anemia, easy bruising or bleeding SKIN: No rash or lesion. MUSCULOSKELETAL: No joint pain or arthritis.   NEUROLOGIC: No tingling, numbness, weakness.  PSYCHIATRY: No anxiety or depression.   DRUG ALLERGIES:   Allergies  Allergen Reactions  . Asa [Aspirin] Other (See Comments)    Reaction: swelling of the right side.  . Bee Venom Swelling  . Codeine Other (See Comments)    Reaction: Difficulty breathing  . Ibuprofen Other (See Comments)    Reaction: Swelling of the right side.  . Iodinated Diagnostic Agents Other (See Comments)    Reaction:  Unknown  Other reaction(s): Unknown    VITALS:  Blood pressure (!) 138/97, pulse (!) 102, temperature 98.1 F (36.7 C), temperature source Oral, resp. rate 16, height  (1.803 m), weight 95.1 kg (209 lb 9.6 oz), SpO2 94 %.  PHYSICAL EXAMINATION:  GENERAL:  62 y.o.-year-old patient lying in the bed with no acute distress.  EYES: Pupils equal, round, reactive to light and accommodation. No scleral icterus. Extraocular muscles intact.  HEENT: Head atraumatic, normocephalic. Oropharynx and nasopharynx clear.  NECK:  Supple, no jugular venous distention. No thyroid enlargement, no tenderness.  LUNGS: Moderate coarse l breath sounds bilaterally, no wheezing, rales,rhonchi.   Has crepitation. No use of accessory muscles of respiration.  CARDIOVASCULAR: S1, S2 normal. No murmurs, rubs, or gallops.  ABDOMEN: Soft, nontender, nondistended. Bowel sounds present. No organomegaly or mass.  EXTREMITIES: No pedal edema, cyanosis, or clubbing.  NEUROLOGIC: Cranial nerves II through XII are intact. Muscle strength 5/5 in all extremities. Sensation intact. Gait not checked.  PSYCHIATRIC: The patient is alert and oriented x 3.  SKIN: No obvious rash, lesion, or ulcer.    LABORATORY PANEL:   CBC Recent Labs  Lab 01/22/18 2007  WBC 18.5*  HGB 16.5  HCT 50.6  PLT 168   ------------------------------------------------------------------------------------------------------------------  Chemistries  Recent Labs  Lab 01/20/18 0340 01/22/18 2007  NA 140 140  K 4.3 4.6  CL 98* 100*  CO2 37* 30  GLUCOSE 99 179*  BUN 34* 45*  CREATININE 1.05 1.33*  CALCIUM 8.7* 9.2  MG 2.3  --    ------------------------------------------------------------------------------------------------------------------  Cardiac Enzymes Recent Labs  Lab 01/23/18 0607  TROPONINI <0.03   ------------------------------------------------------------------------------------------------------------------  RADIOLOGY:  Dg Chest 1 View  Result Date: 01/22/2018 CLINICAL DATA:  63 year old male with hypoxia when checked by home health nurse. On home oxygen. Discharged from the hospital yesterday for CHF. EXAM: CHEST  1 VIEW COMPARISON:  01/15/2018 and earlier. FINDINGS: Portable AP upright view at 1951 hours. Large lung volumes and emphysema which is most apparent in the upper lobes. No pneumothorax. Basilar predominant increased interstitial markings which seem to be at baseline when allowing for portable technique. No new pulmonary opacity. Stable cardiac size and mediastinal contours. Visualized tracheal air column is within normal limits. No pneumothorax or pleural effusion identified.  Chronic  upper abdominal surgical clips. No acute osseous abnormality identified. IMPRESSION: Chronic lung disease with Emphysema (ICD10-J43.9). No superimposed acute findings are identified. Electronically Signed   By: Odessa Fleming M.D.   On: 01/22/2018 20:26   Ct Chest Wo Contrast  Result Date: 01/22/2018 CLINICAL DATA:  Acute respiratory illness common greater than 27 years old. Hypoxia. Discharge hospital yesterday for congestive heart failure. EXAM: CT CHEST WITHOUT CONTRAST TECHNIQUE: Multidetector CT imaging of the chest was performed following the standard protocol without IV contrast. COMPARISON:  One-view chest x-ray 01/22/2018 FINDINGS: Cardiovascular: Heart size is normal. Minimal calcifications are present along the undersurface of the arch. Aortic arch is otherwise within normal limits. Pulmonary arteries are within normal limits. Mediastinum/Nodes: No significant mediastinal, hilar, or axillary adenopathy is present. Lungs/Pleura: Severe centrilobular emphysema is present bilaterally. Scarring in the right upper lobe is stable. New inferior right lower lobe airspace disease is present. Less prominent left lower lobe airspace disease is present. There is partial collapse of the right middle lobe and minimal airspace disease or atelectasis in the lingula. There is no pneumothorax. Upper Abdomen: Limited imaging the abdomen is unremarkable. Musculoskeletal: Exaggerated thoracic kyphosis present. Vertebral body heights are maintained. No focal lytic or blastic lesions are present. IMPRESSION: 1. New right lower lobe pneumonia. 2. Probable left lower lobe airspace disease is well. 3. Severe emphysema. 4. Stable right upper lobe nodule/scar. Electronically Signed   By: Marin Roberts M.D.   On: 01/22/2018 22:56    EKG:   Orders placed or performed during the hospital encounter of 01/22/18  . ED EKG within 10 minutes  . ED EKG within 10 minutes    ASSESSMENT AND PLAN:   1.  Acute on chronic  respiratory failure with hypoxia, secondary to pneumonia.   Continue  broad-spectrum IV antibiotics, nebulizer treatments, IV steroids, oxygen therapy. 2.  HCAP.   on cefepime and vancomycin IV.  3.  Acute COPD exacerbation, see treatment as above under #1. 4.  Acute renal failure, likely prerenal, due to poor p.o. Intake.  Continue gentle IV hydration and monitor kidney function closely.  Avoid nephrotoxic medications. 5.  Tobacco abuse.  Smoking cessation was discussed with patient in detail. 6.  Borderline elevated troponin level at 0.03.  This is likely secondary to demand ischemia due to pneumonia.    Non-trending troponins 7.  Anxiety continue Xanax as needed     All the records are reviewed and case discussed with Care Management/Social Workerr. Management plans discussed with the patient, family and they are in agreement.  CODE STATUS:   TOTAL TIME TAKING CARE OF THIS PATIENT: .   POSSIBLE D/C IN 2  DAYS, DEPENDING ON CLINICAL CONDITION.  Note: This dictation was prepared with Dragon dictation along with smaller phrase technology. Any transcriptional errors that result from this process are unintentional.   Ramonita Lab M.D on 01/23/2018 at 10:16 PM  Between 7am to 6pm - Pager - [phone] After 6pm go to [email or web address] - password [email or web address] [facility]  [facility] [facility]  Office  [phone]  CC: Primary care physician; Lyndon Code, MD

## 2018-01-23 NOTE — Care Management Note (Signed)
Case Management Note  Patient Details  Name: Dustin [name] MRN: [id number] Date of Birth: 1956-05-29  Subjective/Objective:                  Admitted to [facility] with the diagnosis of pneumonia. Son lives in the home. Sees Dr. Welton Flakes as primary care physician. Home Health per [facility] currently.  [facility] 11/2017. Chronic home oxygen per [facility]. CPAP in the home.  Action/Plan: Received referral for Home Health. Open to [facility] currently    Expected Discharge Date:                  Expected Discharge Plan:     In-House Referral:   yes  Discharge planning Services   yes  Post Acute Care Choice:    Choice offered to:     DME Arranged:    DME Agency:     HH Arranged:   Already current  [facility] Agency:   with [facility]  Status of Service:     If discussed at Long Length of Stay Meetings, dates discussed:    Additional Comments:  Gwenette Greet, RN MSN CCM Care Management [phone] 01/23/2018, 10:25 AM

## 2018-01-23 NOTE — ED Notes (Signed)
Called and updated sister with permission from patient

## 2018-01-23 NOTE — H&P (Signed)
[facility] - [place] at [facility]   PATIENT NAME: Dustin [name]    MR#:  [id number]  DATE OF BIRTH:  09/14/1956  DATE OF ADMISSION:  01/22/2018  PRIMARY CARE PHYSICIAN: Lyndon Code, MD   REQUESTING/REFERRING PHYSICIAN:   CHIEF COMPLAINT:   Chief Complaint  Patient presents with  . Shortness of Breath    HISTORY OF PRESENT ILLNESS: Dustin [name]  is a 62 y.o. male with a known history of CHF, asthma, COPD on 2 L continues oxygen per nasal cannula, hypertension, tobacco abuse. Patient was brought to emergency room due to shortness of breath and hypoxia noted by the home health care nurse, who was visiting him.  Per patient, his oxygen saturation was 88% on 2 L oxygen at home, while at rest.  He denies any fever or chills.  No chest pain, no N/V/D, no bleeding.  Patient was just discharged from the hospital the other day.  He was in with acute COPD exacerbation for which he was treated with Rocephin and azithromycin, along with steroids and nebulizer treatments. Blood test done emergency room are remarkable for troponin level is 0.03.  Creatinine is slightly elevated at 1.33.  WBCs elevated at 18.5.  EKG is noted without any acute ischemic changes. CT of the chest, reviewed by myself, shows new right lower lobe pneumonia. Patient is admitted for further evaluation and treatment  PAST MEDICAL HISTORY:   Past Medical History:  Diagnosis Date  . Allergy   . Anxiety   . Asthma   . Chronic diastolic CHF (congestive heart failure) (HCC)    a. echo 07/2013: EF 60-65%, DD, biatrial dilatation, Ao sclerosis, dilated RV, moderate pulmonary HTN, elevated CV and RA pressures; b. patient reported echo at Dr. Milta Deiters office 02/2015 - his office does not have record of him being a pt there c. echo 11/2015: EF 60-65%, Grade 1 DD, mod-severe pulm pressures  . Chronic respiratory failure (HCC)    a. on 2L via nasal cannula; b. secondary to COPD  . COPD (chronic obstructive  pulmonary disease) (HCC)   . Depression   . Emphysema of lung (HCC)   . GERD (gastroesophageal reflux disease)   . Hypertension   . Personal history of tobacco use, presenting hazards to health 08/17/2015  . Tobacco abuse     PAST SURGICAL HISTORY:  Past Surgical History:  Procedure Laterality Date  . ABDOMINAL SURGERY    . ADENOIDECTOMY    . CARPAL TUNNEL RELEASE Right   . corpal tunnel Right   . ELBOW SURGERY Right    repaired tendon  . hemmorhoid N/A   . hemorrh    . NASAL SINUS SURGERY    . NOSE SURGERY    . reflux surgery    . ROTATOR CUFF REPAIR Right   . SHOULDER SURGERY    . TENNIS ELBOW RELEASE/NIRSCHEL PROCEDURE Right   . URETHRA SURGERY     surgery 6 times from age [age] yrs old  . URETHRA SURGERY      SOCIAL HISTORY:  Social History   Tobacco Use  . Smoking status: Current Every Day Smoker    Packs/day: 1.50    Years: 40.00    Pack years: 60.00    Types: Cigarettes  . Smokeless tobacco: Never Used  Substance Use Topics  . Alcohol use: No    FAMILY HISTORY:  Family History  Problem Relation Age of Onset  . CAD Father   . Hyperlipidemia Father   . Stroke  Father   . Heart disease Father   . Hypertension Mother   . Peripheral Artery Disease Mother   . Rheum arthritis Mother   . Asthma Mother   . Bipolar disorder Mother   . Depression Mother   . Malignant hypertension Mother   . Diabetes Neg Hx     DRUG ALLERGIES:  Allergies  Allergen Reactions  . Asa [Aspirin] Other (See Comments)    Reaction: swelling of the right side.  . Bee Venom Swelling  . Codeine Other (See Comments)    Reaction: Difficulty breathing  . Ibuprofen Other (See Comments)    Reaction: Swelling of the right side.  . Iodinated Diagnostic Agents Other (See Comments)    Reaction:  Unknown  Other reaction(s): Unknown    REVIEW OF SYSTEMS:   CONSTITUTIONAL: No fever, the patient complains of fatigue and generalized weakness.  EYES: No blurred or double vision.  EARS,  NOSE, AND THROAT: No tinnitus or ear pain.  RESPIRATORY: Positive for productive cough, shortness of breath, wheezing; no hemoptysis.  CARDIOVASCULAR: No chest pain, orthopnea, edema.  GASTROINTESTINAL: No nausea, vomiting, diarrhea or abdominal pain.  GENITOURINARY: No dysuria, hematuria.  ENDOCRINE: No polyuria, nocturia,  HEMATOLOGY: No bleeding SKIN: No rash or lesion. MUSCULOSKELETAL: No joint pain.   NEUROLOGIC: No focal weakness.  PSYCHIATRY: No anxiety or depression.   MEDICATIONS AT HOME:  Prior to Admission medications   Medication Sig Start Date End Date Taking? Authorizing Provider  acidophilus ([name]) CAPS capsule Take 1 capsule by mouth 2 (two) times daily.   Yes [provider]  Albuterol Sulfate (PROAIR RESPICLICK) 108 (90 Base) MCG/ACT AEPB Inhale 2 puffs into the lungs every 4 (four) hours as needed (for shortness of breath). Reported on 12/10/2015 10/11/17  Yes Yevonne Pax, MD  ALPRAZolam Prudy Feeler) 1 MG tablet Take 1 mg by mouth 3 (three) times daily as needed for anxiety.  12/06/17  Yes [provider]  baclofen 5 MG TABS Take 5 mg by mouth 2 (two) times daily as needed for muscle spasms. 01/21/18  Yes Shaune Pollack, MD  escitalopram (LEXAPRO) 20 MG tablet Take 20 mg by mouth at bedtime. 01/01/18  Yes [provider]  furosemide (LASIX) 20 MG tablet Take 1 tablet (20 mg total) by mouth daily. 11/07/17  Yes Lyndon Code, MD  gabapentin (NEURONTIN) 300 MG capsule TAKE 1 TO 2 CAPSULES BY MOUTH THREE TIMES DAILY 01/01/18  Yes Lyndon Code, MD  guaiFENesin-dextromethorphan [facility] DM) 100-10 MG/5ML syrup Take 5 mLs by mouth every 4 (four) hours as needed for cough. 01/21/18  Yes Shaune Pollack, MD  ipratropium-albuterol (DUONEB) 0.5-2.5 (3) MG/3ML SOLN Take 3 mLs by nebulization 4 (four) times daily as needed (for shortness of breath).   Yes [provider]  magnesium oxide (MAG-OX) 400 MG tablet Take 400 mg by mouth daily.   Yes [provider]  mometasone-formoterol (DULERA) 200-5 MCG/ACT AERO Inhale 2 puffs into the lungs 2 (two) times daily. 11/29/17  Yes Noralee Stain, DO  potassium chloride (K-DUR,KLOR-CON) 10 MEQ tablet Take 10 mEq by mouth daily. Repeat if second LASIX taken   Yes [provider]  predniSONE (DELTASONE) 20 MG tablet Take 2 tablets (40 mg total) by mouth daily with breakfast. 01/21/18  Yes Shaune Pollack, MD  rOPINIRole (REQUIP) 0.25 MG tablet TAKE 1 TABLET BY MOUTH TWICE DAILY FOR CRAMPS 12/13/17  Yes Lyndon Code, MD  theophylline (UNIPHYL) 400 MG 24 hr tablet Take 400 mg by mouth  every morning.    Yes [provider]  tiotropium (SPIRIVA) 18 MCG inhalation capsule Place 18 mcg into inhaler and inhale daily.   Yes [provider]  gabapentin (NEURONTIN) 600 MG tablet Take 0.5 tablets (300 mg total) by mouth at bedtime. Patient not taking: Reported on 01/15/2018 11/29/17   Noralee Stain, DO  pantoprazole (PROTONIX) 40 MG tablet Take 1 tablet (40 mg total) by mouth daily at 12 noon. Patient not taking: Reported on 01/22/2018 11/29/17   Noralee Stain, DO  Spacer/Aero-Holding Chambers [facility] ADVANTAGE-LG MASK) MISC Use as directed with inhaler diag  j44.1 09/28/17   Yevonne Pax, MD  tiotropium (SPIRIVA HANDIHALER) 18 MCG inhalation capsule Place 1 capsule (18 mcg total) into inhaler and inhale daily. 11/29/17 01/15/18  Noralee Stain, DO      PHYSICAL EXAMINATION:   VITAL SIGNS: Blood pressure 136/81, pulse (!) 108, temperature 98.2 F (36.8 C), temperature source Oral, resp. rate (!) 21, height  (1.803 m), weight 95.1 kg (209 lb 9.6 oz), SpO2 91 %.  GENERAL:  62 y.o.-year-old patient lying in the bed with mild respiratory distress.  EYES: Pupils equal, round, reactive to light and accommodation. No scleral icterus. Extraocular muscles intact.  HEENT: Head atraumatic, normocephalic. Oropharynx and nasopharynx clear.  NECK:  Supple, no jugular venous distention. No  thyroid enlargement, no tenderness.  LUNGS: Reduced breath sounds and wheezing noted bilaterally. No use of accessory muscles of respiration.  CARDIOVASCULAR: S1, S2 normal. No S3/S4.  ABDOMEN: Soft, nontender, nondistended. Bowel sounds present. No organomegaly or mass.  EXTREMITIES: No pedal edema, cyanosis, or clubbing.  NEUROLOGIC: No focal weakness. PSYCHIATRIC: The patient is alert and oriented x 3.  SKIN: No obvious rash, lesion, or ulcer.   LABORATORY PANEL:   CBC Recent Labs  Lab 01/16/18 0533 01/22/18 2007  WBC 12.8* 18.5*  HGB 13.9 16.5  HCT 42.7 50.6  PLT 182 168  MCV 97.2 96.7  MCH 31.7 31.6  MCHC 32.6 32.7  RDW 15.2* 15.5*   ------------------------------------------------------------------------------------------------------------------  Chemistries  Recent Labs  Lab 01/16/18 0533 01/17/18 0525 01/19/18 0946 01/20/18 0340 01/22/18 2007  NA 140 140 137 140 140  K 4.1 4.1 4.9 4.3 4.6  CL 98* 99* 96* 98* 100*  CO2 37* 35* 35* 37* 30  GLUCOSE 155* 128* 159* 99 179*  BUN 24* 35* 34* 34* 45*  CREATININE 1.29* 1.06 1.16 1.05 1.33*  CALCIUM 8.9 9.0 9.1 8.7* 9.2  MG  --   --  2.4 2.3  --    ------------------------------------------------------------------------------------------------------------------ estimated creatinine clearance is 66.9 mL/min (A) (by C-G formula based on SCr of 1.33 mg/dL (H)). ------------------------------------------------------------------------------------------------------------------ No results for input(s): TSH, T4TOTAL, T3FREE, THYROIDAB in the last 72 hours.  Invalid input(s): FREET3   Coagulation profile No results for input(s): INR, PROTIME in the last 168 hours. ------------------------------------------------------------------------------------------------------------------- No results for input(s): DDIMER in the last 72  hours. -------------------------------------------------------------------------------------------------------------------  Cardiac Enzymes Recent Labs  Lab 01/22/18 2007  TROPONINI 0.03*   ------------------------------------------------------------------------------------------------------------------ Invalid input(s): POCBNP  ---------------------------------------------------------------------------------------------------------------  Urinalysis    Component Value Date/Time   COLORURINE STRAW (A) 12/06/2017 1200   APPEARANCEUR CLEAR (A) 12/06/2017 1200   APPEARANCEUR Hazy 07/23/2014 2110   LABSPEC 1.008 12/06/2017 1200   LABSPEC 1.027 07/23/2014 2110   PHURINE 7.0 12/06/2017 1200   GLUCOSEU NEGATIVE 12/06/2017 1200   GLUCOSEU Negative 07/23/2014 2110   HGBUR NEGATIVE 12/06/2017 1200   BILIRUBINUR NEGATIVE 12/06/2017 1200   BILIRUBINUR Negative 07/23/2014 2110   KETONESUR NEGATIVE 12/06/2017  1200   PROTEINUR NEGATIVE 12/06/2017 1200   NITRITE NEGATIVE 12/06/2017 1200   LEUKOCYTESUR NEGATIVE 12/06/2017 1200   LEUKOCYTESUR Negative 07/23/2014 2110     RADIOLOGY: Dg Chest 1 View  Result Date: 01/22/2018 CLINICAL DATA:  62 year old male with hypoxia when checked by home health nurse. On home oxygen. Discharged from the hospital yesterday for CHF. EXAM: CHEST  1 VIEW COMPARISON:  01/15/2018 and earlier. FINDINGS: Portable AP upright view at 1951 hours. Large lung volumes and emphysema which is most apparent in the upper lobes. No pneumothorax. Basilar predominant increased interstitial markings which seem to be at baseline when allowing for portable technique. No new pulmonary opacity. Stable cardiac size and mediastinal contours. Visualized tracheal air column is within normal limits. No pneumothorax or pleural effusion identified. Chronic upper abdominal surgical clips. No acute osseous abnormality identified. IMPRESSION: Chronic lung disease with Emphysema (ICD10-J43.9). No  superimposed acute findings are identified. Electronically Signed   By: Odessa Fleming M.D.   On: 01/22/2018 20:26   Ct Chest Wo Contrast  Result Date: 01/22/2018 CLINICAL DATA:  Acute respiratory illness common greater than 82 years old. Hypoxia. Discharge hospital yesterday for congestive heart failure. EXAM: CT CHEST WITHOUT CONTRAST TECHNIQUE: Multidetector CT imaging of the chest was performed following the standard protocol without IV contrast. COMPARISON:  One-view chest x-ray 01/22/2018 FINDINGS: Cardiovascular: Heart size is normal. Minimal calcifications are present along the undersurface of the arch. Aortic arch is otherwise within normal limits. Pulmonary arteries are within normal limits. Mediastinum/Nodes: No significant mediastinal, hilar, or axillary adenopathy is present. Lungs/Pleura: Severe centrilobular emphysema is present bilaterally. Scarring in the right upper lobe is stable. New inferior right lower lobe airspace disease is present. Less prominent left lower lobe airspace disease is present. There is partial collapse of the right middle lobe and minimal airspace disease or atelectasis in the lingula. There is no pneumothorax. Upper Abdomen: Limited imaging the abdomen is unremarkable. Musculoskeletal: Exaggerated thoracic kyphosis present. Vertebral body heights are maintained. No focal lytic or blastic lesions are present. IMPRESSION: 1. New right lower lobe pneumonia. 2. Probable left lower lobe airspace disease is well. 3. Severe emphysema. 4. Stable right upper lobe nodule/scar. Electronically Signed   By: Marin Roberts M.D.   On: 01/22/2018 22:56    EKG: Orders placed or performed during the hospital encounter of 01/22/18  . ED EKG within 10 minutes  . ED EKG within 10 minutes    IMPRESSION AND PLAN:  1.  Acute on chronic respiratory failure with hypoxia, secondary to pneumonia.  We will start treatment with broad-spectrum IV antibiotics, nebulizer treatments, IV steroids,  oxygen therapy. 2.  HCAP.  Patient was started on cefepime and vancomycin IV.  3.  Acute COPD exacerbation, see treatment as above under #1. 4.  Acute renal failure, likely prerenal, due to poor p.o. Intake.  We will start gentle IV hydration and monitor kidney function closely.  Avoid nephrotoxic medications. 5.  Tobacco abuse.  Smoking cessation was discussed with patient in detail. 6.  Borderline elevated troponin level at 0.03.  This is likely secondary to demand ischemia due to pneumonia.  We will continue to monitor patient on telemetry and follow troponin level to rule out ACS.   All the records are reviewed and case discussed with ED provider. Management plans discussed with the patient and he is in agreement.  CODE STATUS: FULL    Code Status Orders  (From admission, onward)        Start  Ordered   01/23/18 0142  Full code  Continuous     01/23/18 0141    Code Status History    Date Active Date Inactive Code Status Order ID Comments User Context   01/15/2018 1038 01/21/2018 2000 Full Code [id number]  Enid Baas, MD Inpatient   11/22/2017 1747 11/29/2017 1654 Full Code [id number]  Kathlene Cote, PA-C Inpatient   09/19/2017 0459 09/24/2017 1936 Full Code [id number]  Arnaldo Natal, MD ED   08/06/2017 1822 08/14/2017 1304 Full Code [id number]  Alford Highland, MD ED   06/28/2017 2304 07/03/2017 2113 Full Code [id number]  Auburn Bilberry, MD ED   01/22/2017 2129 01/29/2017 1944 Full Code [id number]  Houston Siren, MD Inpatient   06/23/2016 0001 06/28/2016 1943 Full Code [id number]  Oralia Manis, MD Inpatient   03/25/2016 1703 03/31/2016 2308 Full Code [id number]  Standley Brooking, MD Inpatient   11/30/2015 0512 12/06/2015 2134 Full Code [id number]  Arnaldo Natal, MD Inpatient   04/13/2015 1722 04/21/2015 1940 Full Code [id number]  Auburn Bilberry, MD Inpatient   03/31/2015 2050 04/05/2015 2048 Full Code [id number]  Hower, Cletis Athens, MD ED    Advance Directive Documentation     Most  Recent Value  Type of Advance Directive  Healthcare Power of Attorney  Pre-existing out of facility DNR order (yellow form or pink MOST form)  -  "MOST" Form in Place?  -       TOTAL TIME TAKING CARE OF THIS PATIENT: 45 minutes.    Cammy Copa M.D on 01/23/2018 at 5:09 AM  Between 7am to 6pm - Pager - [phone]  After 6pm go to [email or web address] - password [email or web address] [facility]  [place] [facility]  Office  [phone]  CC: Primary care physician; Lyndon Code, MD

## 2018-01-23 NOTE — Progress Notes (Signed)
Pharmacy Antibiotic Note  Dustin [name] is a 62 y.o. male admitted on 01/22/2018 with pneumonia.  Pharmacy has been consulted for vanc (cefepime ordered w/o consult) dosing.  Plan: Patient received vanc 1g and cefepime 2g IV x 1  Will continue vanc 1.25g IV q12h w/ 6 hour stack Will draw vanc trough 05/09 @ 2100 prior to 4th dose. Cefepime 2g IV q12h ordered.  Ke 0.0599 T1/2 12 hrs Goal trough 15 - 20 mcg/mL  Height:  (180.3 cm) Weight: 209 lb 9.6 oz (95.1 kg) IBW/kg (Calculated) : 75.3  Temp (24hrs), Avg:98.1 F (36.7 C), Min:98 F (36.7 C), Max:98.2 F (36.8 C)  Recent Labs  Lab 01/16/18 0533 01/17/18 0525 01/19/18 0946 01/20/18 0340 01/22/18 2007  WBC 12.8*  --   --   --  18.5*  CREATININE 1.29* 1.06 1.16 1.05 1.33*    Estimated Creatinine Clearance: 66.9 mL/min (A) (by C-G formula based on SCr of 1.33 mg/dL (H)).    Allergies  Allergen Reactions  . Asa [Aspirin] Other (See Comments)    Reaction: swelling of the right side.  . Bee Venom Swelling  . Codeine Other (See Comments)    Reaction: Difficulty breathing  . Ibuprofen Other (See Comments)    Reaction: Swelling of the right side.  . Iodinated Diagnostic Agents Other (See Comments)    Reaction:  Unknown  Other reaction(s): Unknown    Thank you for allowing pharmacy to be a part of this patient's care.  Thomasene Ripple, PharmD, BCPS Clinical Pharmacist 01/23/2018

## 2018-01-24 LAB — GLUCOSE, CAPILLARY
GLUCOSE-CAPILLARY: 140 mg/dL — AB (ref 65–99)
Glucose-Capillary: 116 mg/dL — ABNORMAL HIGH (ref 65–99)

## 2018-01-24 LAB — PROCALCITONIN

## 2018-01-24 LAB — HIV ANTIBODY (ROUTINE TESTING W REFLEX): HIV SCREEN 4TH GENERATION: NONREACTIVE

## 2018-01-24 MED ORDER — METHYLPREDNISOLONE SODIUM SUCC 40 MG IJ SOLR
40.0000 mg | Freq: Two times a day (BID) | INTRAMUSCULAR | Status: DC
  Administered 2018-01-24 – 2018-01-28 (×8): 40 mg via INTRAVENOUS
  Filled 2018-01-24 (×8): qty 1

## 2018-01-24 MED ORDER — IPRATROPIUM-ALBUTEROL 0.5-2.5 (3) MG/3ML IN SOLN
3.0000 mL | Freq: Four times a day (QID) | RESPIRATORY_TRACT | Status: DC
  Administered 2018-01-24 – 2018-01-25 (×3): 3 mL via RESPIRATORY_TRACT
  Filled 2018-01-24 (×3): qty 3

## 2018-01-24 MED ORDER — INSULIN ASPART 100 UNIT/ML ~~LOC~~ SOLN
0.0000 [IU] | Freq: Three times a day (TID) | SUBCUTANEOUS | Status: DC
  Administered 2018-01-25: 1 [IU] via SUBCUTANEOUS
  Administered 2018-01-26: 2 [IU] via SUBCUTANEOUS
  Administered 2018-01-26 – 2018-01-28 (×2): 1 [IU] via SUBCUTANEOUS
  Filled 2018-01-24 (×4): qty 1

## 2018-01-24 MED ORDER — SODIUM CHLORIDE 0.9 % IV SOLN
2.0000 g | Freq: Three times a day (TID) | INTRAVENOUS | Status: DC
  Administered 2018-01-24 – 2018-01-25 (×4): 2 g via INTRAVENOUS
  Filled 2018-01-24 (×8): qty 2

## 2018-01-24 NOTE — Progress Notes (Signed)
[facility] - [place] at [facility]   PATIENT NAME: Dustin [name]    MR#:  [id number]  DATE OF BIRTH:  May 30, 1956  SUBJECTIVE:  CHIEF COMPLAINT: Shortness of breath is slightly better  REVIEW OF SYSTEMS:  CONSTITUTIONAL: No fever, fatigue or weakness.  EYES: No blurred or double vision.  EARS, NOSE, AND THROAT: No tinnitus or ear pain.  RESPIRATORY:  reports cough, shortness of breath, wheezing, denies hemoptysis.  CARDIOVASCULAR: No chest pain, orthopnea, edema.  GASTROINTESTINAL: No nausea, vomiting, diarrhea or abdominal pain.  GENITOURINARY: No dysuria, hematuria.  ENDOCRINE: No polyuria, nocturia,  HEMATOLOGY: No anemia, easy bruising or bleeding SKIN: No rash or lesion. MUSCULOSKELETAL: No joint pain or arthritis.   NEUROLOGIC: No tingling, numbness, weakness.  PSYCHIATRY: No anxiety or depression.   DRUG ALLERGIES:   Allergies  Allergen Reactions  . Asa [Aspirin] Other (See Comments)    Reaction: swelling of the right side.  . Bee Venom Swelling  . Codeine Other (See Comments)    Reaction: Difficulty breathing  . Ibuprofen Other (See Comments)    Reaction: Swelling of the right side.  . Iodinated Diagnostic Agents Other (See Comments)    Reaction:  Unknown  Other reaction(s): Unknown    VITALS:  Blood pressure (!) 145/92, pulse 95, temperature 97.6 F (36.4 C), temperature source Oral, resp. rate 18, height  (1.803 m), weight 95.1 kg (209 lb 9.6 oz), SpO2 93 %.  PHYSICAL EXAMINATION:  GENERAL:  62 y.o.-year-old patient lying in the bed with no acute distress.  EYES: Pupils equal, round, reactive to light and accommodation. No scleral icterus. Extraocular muscles intact.  HEENT: Head atraumatic, normocephalic. Oropharynx and nasopharynx clear.  NECK:  Supple, no jugular venous distention. No thyroid enlargement, no tenderness.  LUNGS: Moderate coarse l breath sounds bilaterally, no wheezing, rales,rhonchi.  Has crepitation. No  use of accessory muscles of respiration.  CARDIOVASCULAR: S1, S2 normal. No murmurs, rubs, or gallops.  ABDOMEN: Soft, nontender, nondistended. Bowel sounds present. No organomegaly or mass.  EXTREMITIES: No pedal edema, cyanosis, or clubbing.  NEUROLOGIC: Cranial nerves II through XII are intact. Muscle strength 5/5 in all extremities. Sensation intact. Gait not checked.  PSYCHIATRIC: The patient is alert and oriented x 3.  SKIN: No obvious rash, lesion, or ulcer.    LABORATORY PANEL:   CBC Recent Labs  Lab 01/22/18 2007  WBC 18.5*  HGB 16.5  HCT 50.6  PLT 168   ------------------------------------------------------------------------------------------------------------------  Chemistries  Recent Labs  Lab 01/20/18 0340 01/22/18 2007  NA 140 140  K 4.3 4.6  CL 98* 100*  CO2 37* 30  GLUCOSE 99 179*  BUN 34* 45*  CREATININE 1.05 1.33*  CALCIUM 8.7* 9.2  MG 2.3  --    ------------------------------------------------------------------------------------------------------------------  Cardiac Enzymes Recent Labs  Lab 01/23/18 0607  TROPONINI <0.03   ------------------------------------------------------------------------------------------------------------------  RADIOLOGY:  Dg Chest 1 View  Result Date: 01/22/2018 CLINICAL DATA:  62 year old male with hypoxia when checked by home health nurse. On home oxygen. Discharged from the hospital yesterday for CHF. EXAM: CHEST  1 VIEW COMPARISON:  01/15/2018 and earlier. FINDINGS: Portable AP upright view at 1951 hours. Large lung volumes and emphysema which is most apparent in the upper lobes. No pneumothorax. Basilar predominant increased interstitial markings which seem to be at baseline when allowing for portable technique. No new pulmonary opacity. Stable cardiac size and mediastinal contours. Visualized tracheal air column is within normal limits. No pneumothorax or pleural effusion identified. Chronic upper abdominal  surgical clips. No acute osseous abnormality identified. IMPRESSION: Chronic lung disease with Emphysema (ICD10-J43.9). No superimposed acute findings are identified. Electronically Signed   By: Odessa Fleming M.D.   On: 01/22/2018 20:26   Ct Chest Wo Contrast  Result Date: 01/22/2018 CLINICAL DATA:  Acute respiratory illness common greater than 64 years old. Hypoxia. Discharge hospital yesterday for congestive heart failure. EXAM: CT CHEST WITHOUT CONTRAST TECHNIQUE: Multidetector CT imaging of the chest was performed following the standard protocol without IV contrast. COMPARISON:  One-view chest x-ray 01/22/2018 FINDINGS: Cardiovascular: Heart size is normal. Minimal calcifications are present along the undersurface of the arch. Aortic arch is otherwise within normal limits. Pulmonary arteries are within normal limits. Mediastinum/Nodes: No significant mediastinal, hilar, or axillary adenopathy is present. Lungs/Pleura: Severe centrilobular emphysema is present bilaterally. Scarring in the right upper lobe is stable. New inferior right lower lobe airspace disease is present. Less prominent left lower lobe airspace disease is present. There is partial collapse of the right middle lobe and minimal airspace disease or atelectasis in the lingula. There is no pneumothorax. Upper Abdomen: Limited imaging the abdomen is unremarkable. Musculoskeletal: Exaggerated thoracic kyphosis present. Vertebral body heights are maintained. No focal lytic or blastic lesions are present. IMPRESSION: 1. New right lower lobe pneumonia. 2. Probable left lower lobe airspace disease is well. 3. Severe emphysema. 4. Stable right upper lobe nodule/scar. Electronically Signed   By: Marin Roberts M.D.   On: 01/22/2018 22:56    EKG:   Orders placed or performed during the hospital encounter of 01/22/18  . ED EKG within 10 minutes  . ED EKG within 10 minutes    ASSESSMENT AND PLAN:   1.  Acute on chronic respiratory failure with  hypoxia, secondary to pneumonia.  Clinically improving  Continue  broad-spectrum IV antibiotics, nebulizer treatments, IV steroids, oxygen therapy.  MRSA PCR is negative discontinue vancomycin 2.  HCAP.   on cefepime and discontinued vancomycin IV.  3.  Acute COPD exacerbation, see treatment as above under #1. 4.  Acute renal failure, likely prerenal, due to poor p.o. Intake.   Check BMP tomorrow Continue gentle IV hydration and monitor kidney function closely.  Avoid nephrotoxic medications. 5.  Tobacco abuse.  Smoking cessation was discussed with patient in detail. 6.  Borderline elevated troponin level at 0.03.  This is likely secondary to demand ischemia due to pneumonia.    Non-trending troponins 7.  Anxiety continue Xanax as needed  Flutter device and incentive spirometry Out of bed as tolerated   All the records are reviewed and case discussed with Care Management/Social Workerr. Management plans discussed with the patient, family and they are in agreement.  CODE STATUS:   TOTAL TIME TAKING CARE OF THIS PATIENT: .   POSSIBLE D/C IN 1-2  DAYS, DEPENDING ON CLINICAL CONDITION.  Note: This dictation was prepared with Dragon dictation along with smaller phrase technology. Any transcriptional errors that result from this process are unintentional.   Ramonita Lab M.D on 01/24/2018 at 2:42 PM  Between 7am to 6pm - Pager - [phone] After 6pm go to [email or web address] - password [email or web address] [facility]  [place] [facility]  Office  [phone]  CC: Primary care physician; Lyndon Code, MD

## 2018-01-24 NOTE — Progress Notes (Signed)
Pharmacy Antibiotic Note  Dustin [name] is a 62 y.o. male admitted on 01/22/2018 with pneumonia.  Pharmacy has been consulted for vanc (cefepime ordered w/o consult) dosing.  Plan: Will increase cefepime to 2 g iv q 8 hours.   Will discuss d/c vancomycin with MD as MRSA PCR negative.   Height:  (180.3 cm) Weight: 209 lb 9.6 oz (95.1 kg) IBW/kg (Calculated) : 75.3  Temp (24hrs), Avg:97.9 F (36.6 C), Min:97.6 F (36.4 C), Max:98.1 F (36.7 C)  Recent Labs  Lab 01/19/18 0946 01/20/18 0340 01/22/18 2007  WBC  --   --  18.5*  CREATININE 1.16 1.05 1.33*    Estimated Creatinine Clearance: 66.9 mL/min (A) (by C-G formula based on SCr of 1.33 mg/dL (H)).    Allergies  Allergen Reactions  . Asa [Aspirin] Other (See Comments)    Reaction: swelling of the right side.  . Bee Venom Swelling  . Codeine Other (See Comments)    Reaction: Difficulty breathing  . Ibuprofen Other (See Comments)    Reaction: Swelling of the right side.  . Iodinated Diagnostic Agents Other (See Comments)    Reaction:  Unknown  Other reaction(s): Unknown    Thank you for allowing pharmacy to be a part of this patient's care.  Luisa Hart, PharmD Clinical Pharmacist  01/24/2018

## 2018-01-25 ENCOUNTER — Ambulatory Visit: Payer: Self-pay

## 2018-01-25 ENCOUNTER — Ambulatory Visit: Payer: Medicare Other | Admitting: Family

## 2018-01-25 ENCOUNTER — Inpatient Hospital Stay: Payer: Medicare Other

## 2018-01-25 DIAGNOSIS — J189 Pneumonia, unspecified organism: Secondary | ICD-10-CM

## 2018-01-25 LAB — GLUCOSE, CAPILLARY
GLUCOSE-CAPILLARY: 109 mg/dL — AB (ref 65–99)
GLUCOSE-CAPILLARY: 144 mg/dL — AB (ref 65–99)
Glucose-Capillary: 126 mg/dL — ABNORMAL HIGH (ref 65–99)
Glucose-Capillary: 82 mg/dL (ref 65–99)

## 2018-01-25 LAB — CBC
HCT: 43 % (ref 40.0–52.0)
Hemoglobin: 14.2 g/dL (ref 13.0–18.0)
MCH: 31.6 pg (ref 26.0–34.0)
MCHC: 32.9 g/dL (ref 32.0–36.0)
MCV: 96.2 fL (ref 80.0–100.0)
PLATELETS: 154 10*3/uL (ref 150–440)
RBC: 4.47 MIL/uL (ref 4.40–5.90)
RDW: 14.6 % — AB (ref 11.5–14.5)
WBC: 14.3 10*3/uL — ABNORMAL HIGH (ref 3.8–10.6)

## 2018-01-25 LAB — BASIC METABOLIC PANEL
Anion gap: 5 (ref 5–15)
BUN: 39 mg/dL — AB (ref 6–20)
CALCIUM: 9 mg/dL (ref 8.9–10.3)
CO2: 34 mmol/L — ABNORMAL HIGH (ref 22–32)
CREATININE: 1.03 mg/dL (ref 0.61–1.24)
Chloride: 100 mmol/L — ABNORMAL LOW (ref 101–111)
GFR calc non Af Amer: >60 mL/min (ref >60)
Glucose, Bld: 116 mg/dL — ABNORMAL HIGH (ref 65–99)
Potassium: 5 mmol/L (ref 3.5–5.1)
Sodium: 139 mmol/L (ref 135–145)

## 2018-01-25 LAB — CULTURE, RESPIRATORY

## 2018-01-25 LAB — PROCALCITONIN

## 2018-01-25 LAB — CULTURE, RESPIRATORY W GRAM STAIN: Culture: NORMAL

## 2018-01-25 MED ORDER — IPRATROPIUM-ALBUTEROL 0.5-2.5 (3) MG/3ML IN SOLN
3.0000 mL | Freq: Four times a day (QID) | RESPIRATORY_TRACT | Status: DC
  Administered 2018-01-25 – 2018-01-28 (×15): 3 mL via RESPIRATORY_TRACT
  Filled 2018-01-25 (×15): qty 3

## 2018-01-25 MED ORDER — AMOXICILLIN-POT CLAVULANATE 875-125 MG PO TABS
1.0000 | ORAL_TABLET | Freq: Two times a day (BID) | ORAL | Status: DC
  Administered 2018-01-25 – 2018-01-28 (×6): 1 via ORAL
  Filled 2018-01-25 (×7): qty 1

## 2018-01-25 NOTE — Care Management Important Message (Signed)
Copy of signed IM left in patient's room.    

## 2018-01-25 NOTE — Consult Note (Signed)
[facility] [place] Pulmonary Medicine Consultation      Assessment and Plan:  COPD exacerbation with acute bronchitis and acute hypoxic respiratory failure. Ongoing nicotine abuse.  -Continue steroids, nebulizers, oxygen, wean down as tolerated. -Discussed smoking cessation. -[facility]vance activity as tolerated. -Outpatient follow-up with pulmonary.  If symptoms persist, may require bronchoscopy to rule out atypical infection.   Date: 01/25/2018  MRN# [id number] Dustin [name] [birth year]  Referring Physician:   ZEPH [name] is a 62 y.o. old male seen in consultation for chief complaint of:    Chief Complaint  Patient presents with  . Shortness of Breath    HPI:  The patient is a 62 year old male with a history of severe emphysema.  He was seen in the outpatient setting by Dr. Sung Amabile. history includes a hospitalization with intubation for COPD exacerbation.  He was seen in the office on 4/25 with COPD exacerbation, given a Depo-Medrol injection, subsequent follow-up showed continued symptoms, therefore patient was admitted to the hospital on 4/30, he completed courses of antibiotics, discharged on 5/6.  Was sent back to the hospital by  home health care nurse, on 5 7, and readmitted to the hospital at that time with presumed healthcare associated pneumonia and COPD exacerbation.  Antibiotics have been reinstituted in the form of cefepime, and the patient has been placed on Solu-Medrol 40 mg every 12 hours. Review of lab studies unremarkable other than mild leukocytosis with WBC count of 14.3.  MRSA PCR on 01/23/2018 was negative.  Procalcitonin negative on this admission. Currently patient feels that he is doing much better than when he was admitted, notes continued occasional cough, with moderate dyspnea.  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  Mild bibasilar atelectasis.  11/23/17 Echocardiogram:  LVEF 50-55%, grade I diastolic dysfunction. Mild RV hypokinesis of the apex, RVSP est 45 mm  PMHX:   Past Medical History:  Diagnosis Date  . Allergy   . Anxiety   . Asthma   . Chronic diastolic CHF (congestive heart failure) (HCC)    a. echo 07/2013: EF 60-65%, DD, biatrial dilatation, Ao sclerosis, dilated RV, moderate pulmonary HTN, elevated CV and RA pressures; b. patient reported echo at Dr. Milta Deiters office 02/2015 - his office does not have record of him being a pt there c. echo 11/2015: EF 60-65%, Grade 1 DD, mod-severe pulm pressures  . Chronic respiratory failure (HCC)    a. on 2L via nasal cannula; b. secondary to COPD  . COPD (chronic obstructive pulmonary disease) (HCC)   . Depression   . Emphysema of lung (HCC)   . GERD (gastroesophageal reflux disease)   . Hypertension   . Personal history of tobacco use, presenting hazards to health 08/17/2015  . Tobacco abuse    Surgical Hx:  Past Surgical History:  Procedure Laterality Date  . ABDOMINAL SURGERY    . ADENOIDECTOMY    . CARPAL TUNNEL RELEASE Right   . corpal tunnel Right   . ELBOW SURGERY Right    repaired tendon  . hemmorhoid N/A   . hemorrh    . NASAL SINUS SURGERY    . NOSE SURGERY    . reflux surgery    . ROTATOR CUFF REPAIR Right   . SHOULDER SURGERY    . TENNIS ELBOW RELEASE/NIRSCHEL PROCEDURE Right   . URETHRA SURGERY     surgery 6 times from age [age] yrs old  . URETHRA SURGERY  Family Hx:  Family History  Problem Relation Age of Onset  . CAD Father   . Hyperlipidemia Father   . Stroke Father   . Heart disease Father   . Hypertension Mother   . Peripheral Artery Disease Mother   . Rheum arthritis Mother   . Asthma Mother   . Bipolar disorder Mother   . Depression Mother   . Malignant hypertension Mother   . Diabetes Neg Hx    Social Hx:   Social History   Tobacco Use  . Smoking status: Current Every Day Smoker    Packs/day: 1.50    Years: 40.00    Pack years: 60.00    Types:  Cigarettes  . Smokeless tobacco: Never Used  Substance Use Topics  . Alcohol use: No  . Drug use: No   Medication:    Current Facility-Administered Medications:  .  acidophilus ([name]) capsule 1 capsule, 1 capsule, Oral, BID, Cammy Copa, MD, 1 capsule at 01/25/18 0834 .  ALPRAZolam Prudy Feeler) tablet 1 mg, 1 mg, Oral, TID PRN, Cammy Copa, MD, 1 mg at 01/25/18 0616 .  baclofen (LIORESAL) tablet 5 mg, 5 mg, Oral, BID PRN, Cammy Copa, MD, 5 mg at 01/23/18 1011 .  ceFEPIme (MAXIPIME) 2 g in sodium chloride 0.9 % 100 mL IVPB, 2 g, Intravenous, Q8H, Gouru, Aruna, MD, Stopped at 01/25/18 0225 .  escitalopram (LEXAPRO) tablet 20 mg, 20 mg, Oral, QHS, Cammy Copa, MD, 20 mg at 01/24/18 2220 .  furosemide (LASIX) tablet 20 mg, 20 mg, Oral, Daily, Cammy Copa, MD, 20 mg at 01/25/18 0834 .  gabapentin (NEURONTIN) capsule 300 mg, 300 mg, Oral, TID, Cammy Copa, MD, 300 mg at 01/25/18 0834 .  guaiFENesin-dextromethorphan (ROBITUSSIN DM) 100-10 MG/5ML syrup 5 mL, 5 mL, Oral, Q4H PRN, Cammy Copa, MD, 5 mL at 01/25/18 0835 .  heparin injection 5,000 Units, 5,000 Units, Subcutaneous, Q8H, Cammy Copa, MD, 5,000 Units at 01/25/18 [phone] .  insulin aspart (novoLOG) injection 0-9 Units, 0-9 Units, Subcutaneous, TID WC, Gouru, Aruna, MD, 1 Units at 01/25/18 0834 .  ipratropium-albuterol (DUONEB) 0.5-2.5 (3) MG/3ML nebulizer solution 3 mL, 3 mL, Nebulization, QID, Gouru, Aruna, MD, 3 mL at 01/25/18 1131 .  magnesium oxide (MAG-OX) tablet 400 mg, 400 mg, Oral, Daily, Cammy Copa, MD, 400 mg at 01/25/18 0834 .  MEDLINE mouth rinse, 15 mL, Mouth Rinse, BID, Cammy Copa, MD, 15 mL at 01/23/18 2158 .  methylPREDNISolone sodium succinate (SOLU-MEDROL) 40 mg/mL injection 40 mg, 40 mg, Intravenous, Q12H, Gouru, Aruna, MD, 40 mg at 01/25/18 0834 .  potassium chloride (K-DUR,KLOR-CON) CR tablet 10 mEq, 10 mEq, Oral, Daily, Cammy Copa, MD, 10 mEq at 01/25/18 0834 .  rOPINIRole (REQUIP) tablet 0.25 mg,  0.25 mg, Oral, BID, Cammy Copa, MD, 0.25 mg at 01/25/18 0835 .  theophylline (UNIPHYL) 400 MG 24 hr tablet 400 mg, 400 mg, Oral, Mylo Red, MD, 400 mg at 01/25/18 1027 .  tiotropium (SPIRIVA) inhalation capsule 18 mcg, 18 mcg, Inhalation, Daily, Cammy Copa, MD, 18 mcg at 01/25/18 9604   Allergies:  Asa [aspirin]; Bee venom; Codeine; Ibuprofen; and Iodinated diagnostic agents  Review of Systems: Gen:  Denies  fever, sweats, chills HEENT: Denies blurred vision, double vision. bleeds, sore throat Cvc:  No dizziness, chest pain. Resp:   Denies cough  Gi: Denies swallowing difficulty, stomach pain. Gu:  Denies bladder incontinence, burning urine Ext:   No Joint pain, stiffness. Skin: No skin rash,  hives  Endoc:  No polyuria, polydipsia. Psych:  No depression, insomnia. Other:  All other systems were reviewed with the patient and were negative other that what is mentioned in the HPI.   Physical Examination:   VS: BP (!) 131/91 (BP Location: Right Arm)   Pulse 91   Temp 98 F (36.7 C) (Oral)   Resp 20   Ht  (1.803 m)   Wt 208 lb 11.2 oz (94.7 kg)   SpO2 94%   BMI 29.11 kg/m   General Appearance: No distress  Neuro:without focal findings,  speech normal,  HEENT: PERRLA, EOM intact.   Pulmonary: normal breath sounds, scattered wheezing.  CardiovascularNormal S1,S2.  No m/r/g.   Abdomen: Benign, Soft, non-tender. Renal:  No costovertebral tenderness  GU:  No performed at this time. Endoc: No evident thyromegaly, no signs of acromegaly. Skin:   warm, no rashes, no ecchymosis  Extremities: normal, no cyanosis, clubbing.  Other findings:    LABORATORY PANEL:   CBC Recent Labs  Lab 01/25/18 0540  WBC 14.3*  HGB 14.2  HCT 43.0  PLT 154   ------------------------------------------------------------------------------------------------------------------  Chemistries  Recent Labs  Lab 01/20/18 0340  01/25/18 0540  NA 140   < > 139  K 4.3   < >  5.0  CL 98*   < > 100*  CO2 37*   < > 34*  GLUCOSE 99   < > 116*  BUN 34*   < > 39*  CREATININE 1.05   < > 1.03  CALCIUM 8.7*   < > 9.0  MG 2.3  --   --    < > = values in this interval not displayed.   ------------------------------------------------------------------------------------------------------------------  Cardiac Enzymes Recent Labs  Lab 01/23/18 0607  TROPONINI <0.03   ------------------------------------------------------------  RADIOLOGY:  No results found.     Thank  you for the consultation and for allowing [facility] to assist in the care of your patient. Our recommendations are noted above.  Please contact us if we can be of further service.   Wells Guiles, MD.  Board Certified in Internal Medicine, Pulmonary Medicine, Critical Care Medicine, and Sleep Medicine.  [facility] Office Number: [phone]  Santiago Glad, M.D.  Billy Fischer, M.D  01/25/2018

## 2018-01-25 NOTE — Evaluation (Signed)
Physical Therapy Evaluation Patient Details Name: Dustin [name] MRN: [id number] DOB: Dec 12, 1955 Today's Date: 01/25/2018   History of Present Illness  presented to ER secondary to SOB, desaturation (noted by [facility]); admitted with acute/chronic respiratory failure related to PNA.  Of note, recent admission due to COPD exacerbation.  Clinical Impression  Upon evaluation, patient alert and oriented; follows all commands.  Eager for OOB activities; good effort/participation throughout.  Bilat UE/LE strength and ROM grossly symmetrical and WFL; no focal weakness appreciated.  Able to complete bed mobility with mod indep; sit/stand, basic transfers and gait (50') with RW, sup/mod indep.  Patient self-selecting use of RW at this time for optimal safety, energy conservation; does not require for balance or strength deficits.  Limited primarily by cardiopulm deficits; Desat to 84% on 3L with short-distance, requiring 60 seconds seated rest and pursed lip breathing for recovery. Will maintain on caseload throughout remaining hospitalization to ensure continued, progressive mobility. Anticipate no skilled PT needs upon discharge; may benefit from outpatient pulmonary rehab referral.    Follow Up Recommendations No PT follow up    Equipment Recommendations       Recommendations for Other Services       Precautions / Restrictions Precautions Precautions: Fall Restrictions Weight Bearing Restrictions: No      Mobility  Bed Mobility Overal bed mobility: Modified Independent                Transfers Overall transfer level: Modified independent Equipment used: Rolling walker (2 wheeled)             General transfer comment: prefers use of RW this date for optimal safety/stability  Ambulation/Gait Ambulation/Gait assistance: Supervision Ambulation Distance (Feet): 50 Feet Assistive device: Rolling walker (2 wheeled)       General Gait Details: reciprocal stepping pattern with  slow, guarded cadence and overall gait performance; no buckling or LOB.  Desat to 84% on 3L with short-distance, requiring 60 seconds seated rest and pursed lip breathing for recovery.  Stairs            Wheelchair Mobility    Modified Rankin (Stroke Patients Only)       Balance Overall balance assessment: Needs assistance Sitting-balance support: No upper extremity supported;Feet supported Sitting balance-Leahy Scale: Good     Standing balance support: Bilateral upper extremity supported Standing balance-Leahy Scale: Good                               Pertinent Vitals/Pain Pain Assessment: No/denies pain    Home Living Family/patient expects to be discharged to:: Private residence Living Arrangements: Children Available Help at Discharge: Family   Home Access: Stairs to enter Entrance Stairs-Rails: Doctor, general practice of Steps: 3 Home Layout: One level Home Equipment: Environmental consultant - 2 wheels;Shower seat;Grab bars - tub/shower;Walker - 4 wheels      Prior Function Level of Independence: Independent         Comments: Indep with ADLs, household and limited community mobility without assist device; home O2 at 2L; denies fall history.     Hand Dominance   Dominant Hand: Right    Extremity/Trunk Assessment   Upper Extremity Assessment Upper Extremity Assessment: Overall WFL for tasks assessed    Lower Extremity Assessment Lower Extremity Assessment: Overall WFL for tasks assessed       Communication   Communication: No difficulties  Cognition Arousal/Alertness: Awake/alert Behavior During Therapy: WFL for tasks assessed/performed Overall Cognitive Status:  Within Functional Limits for tasks assessed                                        General Comments      Exercises     Assessment/Plan    PT Assessment Patient needs continued PT services  PT Problem List Decreased activity tolerance;Decreased  mobility;Cardiopulmonary status limiting activity       PT Treatment Interventions Functional mobility training;Therapeutic activities;Therapeutic exercise;Patient/family education;Gait training;Stair training    PT Goals (Current goals can be found in the Care Plan section)  Acute Rehab PT Goals Patient Stated Goal: to get better and go home (has vacation scheduled for 5/17) PT Goal Formulation: With patient Time For Goal Achievement: 02/08/18 Potential to Achieve Goals: Good    Frequency Min 2X/week   Barriers to discharge        Co-evaluation               AM-PAC PT "6 Clicks" Daily Activity  Outcome Measure Difficulty turning over in bed (including adjusting bedclothes, sheets and blankets)?: None Difficulty moving from lying on back to sitting on the side of the bed? : None Difficulty sitting down on and standing up from a chair with arms (e.g., wheelchair, bedside commode, etc,.)?: None Help needed moving to and from a bed to chair (including a wheelchair)?: None Help needed walking in hospital room?: A Little Help needed climbing 3-5 steps with a railing? : A Little 6 Click Score: 22    End of Session Equipment Utilized During Treatment: Gait belt;Oxygen Activity Tolerance: Patient tolerated treatment well Patient left: in chair;with call bell/phone within reach Nurse Communication: Mobility status PT Visit Diagnosis: Difficulty in walking, not elsewhere classified (R26.2)    Time: 0454-0981 PT Time Calculation (min) (ACUTE ONLY): 20 min   Charges:   PT Evaluation $PT Eval Low Complexity: 1 Low     PT G Codes:        Satchel Heidinger H. Manson Passey, PT, DPT, NCS 01/25/18, 4:51 PM [phone]

## 2018-01-25 NOTE — Care Management (Signed)
Currently patient receiving home health nurse and physical therapy with [facility].  Physical therapy has evaluated and no physical therapy foillow up needed.

## 2018-01-25 NOTE — Progress Notes (Signed)
[facility] - [place] at [facility]   PATIENT NAME: Dustin [name]    MR#:  [id number]  DATE OF BIRTH:  1955/12/10  SUBJECTIVE:  CHIEF COMPLAINT: Shortness of breath is slightly better, coughing, he wants to see pulmonologist  REVIEW OF SYSTEMS:  CONSTITUTIONAL: No fever, fatigue or weakness.  EYES: No blurred or double vision.  EARS, NOSE, AND THROAT: No tinnitus or ear pain.  RESPIRATORY:  reports cough, shortness of breath, wheezing, denies hemoptysis.  CARDIOVASCULAR: No chest pain, orthopnea, edema.  GASTROINTESTINAL: No nausea, vomiting, diarrhea or abdominal pain.  GENITOURINARY: No dysuria, hematuria.  ENDOCRINE: No polyuria, nocturia,  HEMATOLOGY: No anemia, easy bruising or bleeding SKIN: No rash or lesion. MUSCULOSKELETAL: No joint pain or arthritis.   NEUROLOGIC: No tingling, numbness, weakness.  PSYCHIATRY: No anxiety or depression.   DRUG ALLERGIES:   Allergies  Allergen Reactions  . Asa [Aspirin] Other (See Comments)    Reaction: swelling of the right side.  . Bee Venom Swelling  . Codeine Other (See Comments)    Reaction: Difficulty breathing  . Ibuprofen Other (See Comments)    Reaction: Swelling of the right side.  . Iodinated Diagnostic Agents Other (See Comments)    Reaction:  Unknown  Other reaction(s): Unknown    VITALS:  Blood pressure (!) 131/91, pulse 91, temperature 98 F (36.7 C), temperature source Oral, resp. rate 20, height  (1.803 m), weight 94.7 kg (208 lb 11.2 oz), SpO2 94 %.  PHYSICAL EXAMINATION:  GENERAL:  62 y.o.-year-old patient lying in the bed with no acute distress.  EYES: Pupils equal, round, reactive to light and accommodation. No scleral icterus. Extraocular muscles intact.  HEENT: Head atraumatic, normocephalic. Oropharynx and nasopharynx clear.  NECK:  Supple, no jugular venous distention. No thyroid enlargement, no tenderness.  LUNGS: Moderate coarse l breath sounds bilaterally, no  wheezing, rales,rhonchi.  Has crepitation. No use of accessory muscles of respiration.  CARDIOVASCULAR: S1, S2 normal. No murmurs, rubs, or gallops.  ABDOMEN: Soft, nontender, nondistended. Bowel sounds present. No organomegaly or mass.  EXTREMITIES: No pedal edema, cyanosis, or clubbing.  NEUROLOGIC: Cranial nerves II through XII are intact. Muscle strength 5/5 in all extremities. Sensation intact. Gait not checked.  PSYCHIATRIC: The patient is alert and oriented x 3.  SKIN: No obvious rash, lesion, or ulcer.    LABORATORY PANEL:   CBC Recent Labs  Lab 01/25/18 0540  WBC 14.3*  HGB 14.2  HCT 43.0  PLT 154   ------------------------------------------------------------------------------------------------------------------  Chemistries  Recent Labs  Lab 01/20/18 0340  01/25/18 0540  NA 140   < > 139  K 4.3   < > 5.0  CL 98*   < > 100*  CO2 37*   < > 34*  GLUCOSE 99   < > 116*  BUN 34*   < > 39*  CREATININE 1.05   < > 1.03  CALCIUM 8.7*   < > 9.0  MG 2.3  --   --    < > = values in this interval not displayed.   ------------------------------------------------------------------------------------------------------------------  Cardiac Enzymes Recent Labs  Lab 01/23/18 0607  TROPONINI <0.03   ------------------------------------------------------------------------------------------------------------------  RADIOLOGY:  No results found.  EKG:   Orders placed or performed during the hospital encounter of 01/22/18  . ED EKG within 10 minutes  . ED EKG within 10 minutes  . EKG 12-Lead  . EKG 12-Lead    ASSESSMENT AND PLAN:   1.  Acute on chronic respiratory failure with  hypoxia, secondary to pneumonia.  Clinically improving  Continue  broad-spectrum IV antibiotics, nebulizer treatments, taper IV steroids, oxygen therapy.  MRSA PCR is negative discontinue vancomycin 2.  HCAP with recurrent pneumonia   on cefepime and discontinued vancomycin IV.  Clinically  improving patient prefers seeing pulmonologist consult placed IS  3.  Acute COPD exacerbation, see treatment as above under #1. 4.  Acute renal failure, likely prerenal, due to poor p.o. Intake.   Improved with gentle hydration continue gentle IV hydration. Avoid nephrotoxic medications. 5.  Tobacco abuse.  Smoking cessation was discussed with patient in detail. 6.  Borderline elevated troponin level at 0.03.  This is likely secondary to demand ischemia due to pneumonia.    Non-trending troponins 7.  Anxiety continue Xanax as needed  Flutter device and incentive spirometry Out of bed as tolerated Generalized weakness PT consult placed  All the records are reviewed and case discussed with Care Management/Social Workerr. Management plans discussed with the patient, family and they are in agreement.  CODE STATUS:   TOTAL TIME TAKING CARE OF THIS PATIENT: .   POSSIBLE D/C IN 1  DAYS, DEPENDING ON CLINICAL CONDITION.  Note: This dictation was prepared with Dragon dictation along with smaller phrase technology. Any transcriptional errors that result from this process are unintentional.   Ramonita Lab M.D on 01/25/2018 at 1:59 PM  Between 7am to 6pm - Pager - [phone] After 6pm go to [email or web address] - password [email or web address] [facility]  [place] [facility]  Office  [phone]  CC: Primary care physician; Lyndon Code, MD

## 2018-01-26 DIAGNOSIS — J441 Chronic obstructive pulmonary disease with (acute) exacerbation: Secondary | ICD-10-CM

## 2018-01-26 LAB — GLUCOSE, CAPILLARY
Glucose-Capillary: 164 mg/dL — ABNORMAL HIGH (ref 65–99)
Glucose-Capillary: 89 mg/dL (ref 65–99)
Glucose-Capillary: 142 mg/dL — ABNORMAL HIGH (ref 65–99)
Glucose-Capillary: 103 mg/dL — ABNORMAL HIGH (ref 65–99)

## 2018-01-26 MED ORDER — BUDESONIDE 0.25 MG/2ML IN SUSP
0.2500 mg | Freq: Two times a day (BID) | RESPIRATORY_TRACT | Status: DC
  Administered 2018-01-26 – 2018-01-28 (×4): 0.25 mg via RESPIRATORY_TRACT
  Filled 2018-01-26 (×4): qty 2

## 2018-01-26 NOTE — Progress Notes (Deleted)
Pt to be discharged today. Iv and tele removed. disch instructions and prescrips and elequis coupon given to pt. disch via w.c. Accompanied by her daughter.

## 2018-01-26 NOTE — Progress Notes (Signed)
[facility] - [place] at [facility]   PATIENT NAME: Dustin [name]    MR#:  [id number]  DATE OF BIRTH:  1956-05-08  SUBJECTIVE:  CHIEF COMPLAINT: Shortness of breath is better, cough is improving.  Seen by pulmonologist, chest x-ray is improving.  Physical therapy recommended no PT needs.  Patient states he does not feel comfortable to go home until pneumonia is completely resolved, refusing to go home  REVIEW OF SYSTEMS:  CONSTITUTIONAL: No fever, fatigue or weakness.  EYES: No blurred or double vision.  EARS, NOSE, AND THROAT: No tinnitus or ear pain.  RESPIRATORY:  reports cough, shortness of breath, wheezing, denies hemoptysis.  CARDIOVASCULAR: No chest pain, orthopnea, edema.  GASTROINTESTINAL: No nausea, vomiting, diarrhea or abdominal pain.  GENITOURINARY: No dysuria, hematuria.  ENDOCRINE: No polyuria, nocturia,  HEMATOLOGY: No anemia, easy bruising or bleeding SKIN: No rash or lesion. MUSCULOSKELETAL: No joint pain or arthritis.   NEUROLOGIC: No tingling, numbness, weakness.  PSYCHIATRY: No anxiety or depression.   DRUG ALLERGIES:   Allergies  Allergen Reactions  . Asa [Aspirin] Other (See Comments)    Reaction: swelling of the right side.  . Bee Venom Swelling  . Codeine Other (See Comments)    Reaction: Difficulty breathing  . Ibuprofen Other (See Comments)    Reaction: Swelling of the right side.  . Iodinated Diagnostic Agents Other (See Comments)    Reaction:  Unknown  Other reaction(s): Unknown    VITALS:  Blood pressure 127/88, pulse (!) 117, temperature 98.1 F (36.7 C), temperature source Oral, resp. rate 20, height  (1.803 m), weight 94.7 kg (208 lb 11.2 oz), SpO2 96 %.  PHYSICAL EXAMINATION:  GENERAL:  62 y.o.-year-old patient lying in the bed with no acute distress.  EYES: Pupils equal, round, reactive to light and accommodation. No scleral icterus. Extraocular muscles intact.  HEENT: Head atraumatic, normocephalic.  Oropharynx and nasopharynx clear.  NECK:  Supple, no jugular venous distention. No thyroid enlargement, no tenderness.  LUNGS: Moderate coarse l breath sounds bilaterally, no wheezing, rales,rhonchi.  Has crepitation. No use of accessory muscles of respiration.  CARDIOVASCULAR: S1, S2 normal. No murmurs, rubs, or gallops.  ABDOMEN: Soft, nontender, nondistended. Bowel sounds present. No organomegaly or mass.  EXTREMITIES: No pedal edema, cyanosis, or clubbing.  NEUROLOGIC: Cranial nerves II through XII are intact. Muscle strength 5/5 in all extremities. Sensation intact. Gait not checked.  PSYCHIATRIC: The patient is alert and oriented x 3.  SKIN: No obvious rash, lesion, or ulcer.    LABORATORY PANEL:   CBC Recent Labs  Lab 01/25/18 0540  WBC 14.3*  HGB 14.2  HCT 43.0  PLT 154   ------------------------------------------------------------------------------------------------------------------  Chemistries  Recent Labs  Lab 01/20/18 0340  01/25/18 0540  NA 140   < > 139  K 4.3   < > 5.0  CL 98*   < > 100*  CO2 37*   < > 34*  GLUCOSE 99   < > 116*  BUN 34*   < > 39*  CREATININE 1.05   < > 1.03  CALCIUM 8.7*   < > 9.0  MG 2.3  --   --    < > = values in this interval not displayed.   ------------------------------------------------------------------------------------------------------------------  Cardiac Enzymes Recent Labs  Lab 01/23/18 0607  TROPONINI <0.03   ------------------------------------------------------------------------------------------------------------------  RADIOLOGY:  Dg Chest 2 View  Result Date: 01/25/2018 CLINICAL DATA:  Cough for several days EXAM: CHEST - 2 VIEW COMPARISON:  01/22/2018, chest  x-ray from 09/18/2017 FINDINGS: Cardiac shadow is within normal limits. The lungs are hyperaerated bilaterally. Persistent chronic changes in the right middle lobe are noted. Mild chronic changes in the left base are again seen stable from multiple  previous exams. Patchy changes in right lower lobe are again seen. No new focal abnormality is seen. No acute bony abnormality is noted. IMPRESSION: Chronic right middle lobe and left basilar scarring Right basilar changes similar to that seen on prior CT examination. Electronically Signed   By: Alcide Clever M.D.   On: 01/25/2018 17:01    EKG:   Orders placed or performed during the hospital encounter of 01/22/18  . ED EKG within 10 minutes  . ED EKG within 10 minutes  . EKG 12-Lead  . EKG 12-Lead    ASSESSMENT AND PLAN:   1.  Acute on chronic respiratory failure with hypoxia, secondary to pneumonia.  Clinically improving , but refusing to go home until he completely feels better Continue  antibiotics, nebulizer treatments, taper IV steroids, oxygen therapy.  MRSA PCR is negative discontinue vancomycin Pulmonary is following.  Discussed with Dr. Lonn Georgia 2.  HCAP with recurrent pneumonia   on cefepime and discontinued vancomycin IV.  Clinically improving , IV antibiotics changed to p.o. Augmentin  3.  Acute COPD exacerbation, see treatment as above under #1. 4.  Acute renal failure, likely prerenal, due to poor p.o. Intake.   Improved with gentle hydration continue gentle IV hydration. Avoid nephrotoxic medications. 5.  Tobacco abuse.  Smoking cessation was discussed with patient in detail. 6.  Borderline elevated troponin level at 0.03.  This is likely secondary to demand ischemia due to pneumonia.    Non-trending troponins 7.  Anxiety continue Xanax as needed  Flutter device and incentive spirometry Out of bed as tolerated Generalized weakness PT consult placed  All the records are reviewed and case discussed with Care Management/Social Workerr. Management plans discussed with the patient, family and they are in agreement.  CODE STATUS:   TOTAL TIME TAKING CARE OF THIS PATIENT: 29 minutes.   POSSIBLE D/C IN 1  DAYS, DEPENDING ON CLINICAL CONDITION.  Note: This dictation was  prepared with Dragon dictation along with smaller phrase technology. Any transcriptional errors that result from this process are unintentional.   Ramonita Lab M.D on 01/26/2018 at 4:33 PM  Between 7am to 6pm - Pager - [phone] After 6pm go to [email or web address] - password [email or web address] [facility]  [place] [facility]  Office  [phone]  CC: Primary care physician; Lyndon Code, MD

## 2018-01-26 NOTE — Progress Notes (Signed)
Follow up - Critical Care Medicine Note  Patient Details:    Dustin [name] is an 62 y.o. male. with a history of severe emphysema.  He was seen in the outpatient setting by Dr. Sung Amabile. history includes a hospitalization with intubation for COPD exacerbation.  He was seen in the office on 4/25 with COPD exacerbation, given a Depo-Medrol injection, subsequent follow-up showed continued symptoms, therefore patient was admitted to the hospital on 4/30, he completed courses of antibiotics, discharged on 5/6.  Was sent back to the hospital by  home health care nurse, on 5 7, and readmitted to the hospital at that time with presumed healthcare associated pneumonia and COPD exacerbation.    Anti-infectives:  Anti-infectives (From admission, onward)   Start     Dose/Rate Route Frequency Ordered Stop   01/25/18 2200  amoxicillin-clavulanate (AUGMENTIN) 875-125 MG per tablet 1 tablet     1 tablet Oral Every 12 hours 01/25/18 1550     01/24/18 1130  ceFEPIme (MAXIPIME) 2 g in sodium chloride 0.9 % 100 mL IVPB  Status:  Discontinued     2 g 200 mL/hr over 30 Minutes Intravenous Every 8 hours 01/24/18 1024 01/25/18 1550   01/23/18 1200  ceFEPIme (MAXIPIME) 2 g in sodium chloride 0.9 % 100 mL IVPB  Status:  Discontinued     2 g 200 mL/hr over 30 Minutes Intravenous Every 12 hours 01/23/18 0141 01/24/18 1024   01/23/18 1000  vancomycin (VANCOCIN) 1,250 mg in sodium chloride 0.9 % 250 mL IVPB  Status:  Discontinued     1,250 mg 166.7 mL/hr over 90 Minutes Intravenous Every 12 hours 01/23/18 0518 01/24/18 1446   01/23/18 0315  vancomycin (VANCOCIN) IVPB 1000 mg/200 mL premix     1,000 mg 200 mL/hr over 60 Minutes Intravenous STAT 01/23/18 0312 01/23/18 0515   01/22/18 2330  ceFEPIme (MAXIPIME) 2 g in sodium chloride 0.9 % 100 mL IVPB     2 g 200 mL/hr over 30 Minutes Intravenous  Once 01/22/18 2327 01/23/18 0040      Microbiology: Results for orders placed or performed during the hospital encounter  of 01/22/18  Blood culture (routine x 2)     Status: None (Preliminary result)   Collection Time: 01/22/18 11:56 PM  Result Value Ref Range Status   Specimen Description BLOOD RIGHT [facility]  Final   Special Requests   Final    BOTTLES DRAWN AEROBIC AND ANAEROBIC Blood Culture adequate volume   Culture   Final    NO GROWTH 3 DAYS Performed at [facility], [address] [place], [place] [address]    Report Status PENDING  Incomplete  Blood culture (routine x 2)     Status: None (Preliminary result)   Collection Time: 01/22/18 11:56 PM  Result Value Ref Range Status   Specimen Description BLOOD RIGHT HAND  Final   Special Requests   Final    BOTTLES DRAWN AEROBIC AND ANAEROBIC Blood Culture adequate volume   Culture   Final    NO GROWTH 3 DAYS Performed at [facility], [address] [place], [place] [address]    Report Status PENDING  Incomplete  Culture, sputum-assessment     Status: None   Collection Time: 01/23/18  1:42 AM  Result Value Ref Range Status   Specimen Description EXPECTORATED SPUTUM  Final   Special Requests NONE  Final   Sputum evaluation   Final    THIS SPECIMEN IS ACCEPTABLE FOR SPUTUM CULTURE Performed  at [facility], [address] [place], [place] [address]    Report Status 01/23/2018 FINAL  Final  Culture, respiratory (NON-Expectorated)     Status: None   Collection Time: 01/23/18  1:42 AM  Result Value Ref Range Status   Specimen Description   Final    EXPECTORATED SPUTUM Performed at [facility], [address] [place], [place] [address]    Special Requests   Final    NONE Reflexed from [id number] Performed at [facility], [address] [place], [place] [address]    Gram Stain   Final    RARE WBC PRESENT, PREDOMINANTLY PMN FEW GRAM POSITIVE COCCI IN CLUSTERS FEW GRAM POSITIVE RODS RARE GRAM NEGATIVE RODS    Culture   Final    Consistent with normal respiratory flora. Performed at [facility], [address] [place], [place] [address]    Report Status 01/25/2018 FINAL  Final  MRSA PCR Screening     Status: None   Collection Time: 01/23/18  2:52 PM  Result Value Ref Range Status   MRSA by PCR NEGATIVE NEGATIVE Final    Comment:        The GeneXpert MRSA Assay (FDA approved for NASAL specimens only), is one component of a comprehensive MRSA colonization surveillance program. It is not intended to diagnose MRSA infection nor to guide or monitor treatment for MRSA infections. Performed at [facility], [address], [place], [place] [address]     Studies: Dg Chest 1 View  Result Date: 01/22/2018 CLINICAL DATA:  62 year old male with hypoxia when checked by home health nurse. On home oxygen. Discharged from the hospital yesterday for CHF. EXAM: CHEST  1 VIEW COMPARISON:  01/15/2018 and earlier. FINDINGS: Portable AP upright view at 1951 hours. Large lung volumes and emphysema which is most apparent in the upper lobes. No pneumothorax. Basilar predominant increased interstitial markings which seem to be at baseline when allowing for portable technique. No new pulmonary opacity. Stable cardiac size and mediastinal contours. Visualized tracheal air column is within normal limits. No pneumothorax or pleural effusion identified. Chronic upper abdominal surgical clips. No acute osseous abnormality identified. IMPRESSION: Chronic lung disease with Emphysema (ICD10-J43.9). No superimposed acute findings are identified. Electronically Signed   By: Odessa Fleming M.D.   On: 01/22/2018 20:26   Dg Chest 2 View  Result Date: 01/25/2018 CLINICAL DATA:  Cough for several days EXAM: CHEST - 2 VIEW COMPARISON:  01/22/2018, chest x-ray from 09/18/2017 FINDINGS: Cardiac shadow is within normal limits. The lungs are hyperaerated bilaterally. Persistent chronic changes in the right middle lobe are noted. Mild chronic changes in the left base are again seen stable from multiple previous  exams. Patchy changes in right lower lobe are again seen. No new focal abnormality is seen. No acute bony abnormality is noted. IMPRESSION: Chronic right middle lobe and left basilar scarring Right basilar changes similar to that seen on prior CT examination. Electronically Signed   By: Alcide Clever M.D.   On: 01/25/2018 17:01   Dg Chest 2 View  Result Date: 01/15/2018 CLINICAL DATA:  Shortness of breath, COPD EXAM: CHEST - 2 VIEW COMPARISON:  11/22/2017 FINDINGS: COPD changes with hyperinflation and bullous disease in the upper lobes. Increased lung markings in the lower lobes have improved since prior study, likely representing improving edema. No effusions. Heart is normal size. No acute bony abnormality. IMPRESSION: Improving pulmonary edema pattern. Bullous emphysematous changes. Electronically Signed   By: Caryn Bee  Dover M.D.   On: 01/15/2018 11:04   Ct Chest Wo Contrast  Result Date: 01/22/2018 CLINICAL DATA:  Acute respiratory illness common greater than 34 years old. Hypoxia. Discharge hospital yesterday for congestive heart failure. EXAM: CT CHEST WITHOUT CONTRAST TECHNIQUE: Multidetector CT imaging of the chest was performed following the standard protocol without IV contrast. COMPARISON:  One-view chest x-ray 01/22/2018 FINDINGS: Cardiovascular: Heart size is normal. Minimal calcifications are present along the undersurface of the arch. Aortic arch is otherwise within normal limits. Pulmonary arteries are within normal limits. Mediastinum/Nodes: No significant mediastinal, hilar, or axillary adenopathy is present. Lungs/Pleura: Severe centrilobular emphysema is present bilaterally. Scarring in the right upper lobe is stable. New inferior right lower lobe airspace disease is present. Less prominent left lower lobe airspace disease is present. There is partial collapse of the right middle lobe and minimal airspace disease or atelectasis in the lingula. There is no pneumothorax. Upper Abdomen: Limited  imaging the abdomen is unremarkable. Musculoskeletal: Exaggerated thoracic kyphosis present. Vertebral body heights are maintained. No focal lytic or blastic lesions are present. IMPRESSION: 1. New right lower lobe pneumonia. 2. Probable left lower lobe airspace disease is well. 3. Severe emphysema. 4. Stable right upper lobe nodule/scar. Electronically Signed   By: Marin Roberts M.D.   On: 01/22/2018 22:56    Consults: Treatment Team:  Shane Crutch, MD   Subjective:    Overnight Issues: Patient states he is breathing better but still not back to baseline.  States that he gets extremely short of breath, desaturates with heart racing with any exertion.  Presently getting a breathing treatment  Objective:  Vital signs for last 24 hours: Temp:  [97.6 F (36.4 C)-98.6 F (37 C)] 98.1 F (36.7 C) (05/11 0800) Pulse Rate:  [104-117] 117 (05/11 0800) Resp:  [16-20] 20 (05/11 0800) BP: (127-136)/(70-96) 127/88 (05/11 0800) SpO2:  [84 %-96 %] 96 % (05/11 0800)  Hemodynamic parameters for last 24 hours:    Intake/Output from previous day: 05/10 0701 - 05/11 0700 In: 1440 [P.O.:1440] Out: 4070 [Urine:3270; Stool:800]  Intake/Output this shift: Total I/O In: 480 [P.O.:480] Out: 625 [Urine:625]  Vent settings for last 24 hours:    Physical Exam:  Vital signs: Please see the above listed vital signs Cardiovascular: Regular rate and rhythm Pulmonary: Diffuse expiratory rhonchi appreciated, patient presently getting a breathing treatment Abdominal: Positive bowel sounds, soft exam Extremities: No clubbing cyanosis or edema noted  Assessment/Plan:   COPD exacerbation with presumed superimposed infection.  No specific sputum growth.  Patient is presently on Solu-Medrol, albuterol, Atrovent, Augmentin, theophylline and Tiotropium.  We will add budesonide to nebs.  Patient is rhonchorous on exam, with respiratory distress with minimal exertion.  Would continue over the  weekend treatment with consideration for discharge on Monday if patient is stable.  May convert to prednisone tomorrow In anticipation for Monday discharge   Darrell Hauk 01/26/2018  *Care during the described time interval was provided by me and/or other providers on the critical care team.  I have reviewed this patient's available data, including medical history, events of note, physical examination and test results as part of my evaluation.

## 2018-01-26 NOTE — Care Management Note (Signed)
Case Management Note  Patient Details  Name: Dustin [name] MRN: [id number] Date of Birth: 30-Jan-1956  Subjective/Objective:  Discharging today                  Action/Plan: Notified [facility] of discharge and resumption of care for RN and PT  Expected Discharge Date:  01/26/18               Expected Discharge Plan:  Home w Home Health Services  In-House Referral:     Discharge planning Services  CM Consult  Post Acute Care Choice:  Resumption of Svcs/PTA Provider Choice offered to:  Patient  DME Arranged:    DME Agency:     HH Arranged:  PT, RN HH Agency:  [facility]  Status of Service:  Completed, signed off  If discussed at Long Length of Stay Meetings, dates discussed:    Additional Comments:  [name], RN 01/26/2018, 11:15 AM

## 2018-01-27 LAB — GLUCOSE, CAPILLARY
GLUCOSE-CAPILLARY: 113 mg/dL — AB (ref 65–99)
GLUCOSE-CAPILLARY: 134 mg/dL — AB (ref 65–99)
Glucose-Capillary: 94 mg/dL (ref 65–99)
Glucose-Capillary: 113 mg/dL — ABNORMAL HIGH (ref 65–99)

## 2018-01-27 NOTE — Progress Notes (Signed)
[facility] [place] [facility]   PATIENT NAME: Dustin [name]    MR#:  [id number]  DATE OF BIRTH:  September 13, 1956  SUBJECTIVE:   Has cough with productive whitish phlegm. Patient has chronic cough which is increased somewhat but improving. Shortness of breath appears at baseline. Sats more than 90% on 3 L. Able to complete sentence without respiratory distress. REVIEW OF SYSTEMS:   Review of Systems  Constitutional: Negative for chills, fever and weight loss.  HENT: Negative for ear discharge, ear pain and nosebleeds.   Eyes: Negative for blurred vision, pain and discharge.  Respiratory: Positive for cough, sputum production and shortness of breath. Negative for wheezing and stridor.   Cardiovascular: Negative for chest pain, palpitations, orthopnea and PND.  Gastrointestinal: Negative for abdominal pain, diarrhea, nausea and vomiting.  Genitourinary: Negative for frequency and urgency.  Musculoskeletal: Negative for back pain and joint pain.  Neurological: Negative for sensory change, speech change, focal weakness and weakness.  Psychiatric/Behavioral: Negative for depression and hallucinations. The patient is not nervous/anxious.    Tolerating Diet: yes Tolerating PT: no PT needs at home  DRUG ALLERGIES:   Allergies  Allergen Reactions  . Asa [Aspirin] Other (See Comments)    Reaction: swelling of the right side.  . Bee Venom Swelling  . Codeine Other (See Comments)    Reaction: Difficulty breathing  . Ibuprofen Other (See Comments)    Reaction: Swelling of the right side.  . Iodinated Diagnostic Agents Other (See Comments)    Reaction:  Unknown  Other reaction(s): Unknown    VITALS:  Blood pressure 134/79, pulse (!) 105, temperature 97.8 F (36.6 C), temperature source Oral, resp. rate 16, height  (1.803 m), weight 94.7 kg (208 lb 11.2 oz), SpO2 90 %.  PHYSICAL EXAMINATION:   Physical Exam  GENERAL:  62 y.o.-year-old patient lying  in the bed with no acute distress.  EYES: Pupils equal, round, reactive to light and accommodation. No scleral icterus. Extraocular muscles intact.  HEENT: Head atraumatic, normocephalic. Oropharynx and nasopharynx clear.  NECK:  Supple, no jugular venous distention. No thyroid enlargement, no tenderness.  LUNGS: chronic course breath sounds bilaterally, no wheezing, rales, rhonchi. No use of accessory muscles of respiration.  CARDIOVASCULAR: S1, S2 normal. No murmurs, rubs, or gallops.  ABDOMEN: Soft, nontender, nondistended. Bowel sounds present. No organomegaly or mass.  EXTREMITIES: No cyanosis, clubbing or edema b/l.    NEUROLOGIC: Cranial nerves II through XII are intact. No focal Motor or sensory deficits b/l.   PSYCHIATRIC:  patient is alert and oriented x 3.  SKIN: No obvious rash, lesion, or ulcer.   LABORATORY PANEL:  CBC Recent Labs  Lab 01/25/18 0540  WBC 14.3*  HGB 14.2  HCT 43.0  PLT 154    Chemistries  Recent Labs  Lab 01/25/18 0540  NA 139  K 5.0  CL 100*  CO2 34*  GLUCOSE 116*  BUN 39*  CREATININE 1.03  CALCIUM 9.0   Cardiac Enzymes Recent Labs  Lab 01/23/18 0607  TROPONINI <0.03   RADIOLOGY:  No results found. ASSESSMENT AND PLAN:  Dustin [name]  is a 62 y.o. male with a known history of chronic respiratory failure secondary to COPD on 2 L home oxygen, chronic diastolic heart failure, depression anxiety, neuropathy presents from pulmonary office secondary to worsening shortness of breath and productive sputum  1.Acute on chronic respiratory failure with hypoxia,secondary to pneumonia. Clinically improving--- patient appearing appears to be nearing baseline. Continue  antibiotics,nebulizer  treatments, taper IV steroids, oxygen therapy.  MRSA PCR is negative discontinue vancomycin Pulmonary is following.  Discussed with Dr. Lonn Georgia -now on oral, Augmentin  -continue flutter valve and incentive spirometer  2.HCAP with recurrent  pneumonia  -  Clinically improving , IV antibiotics changed to p.o. Augmentin   3.Acute COPD exacerbation,see treatment as above under #1.  4.Acute renal failure,likely prerenal,due to poor p.o. Intake.   5.Tobacco abuse.Smoking cessation was discussed with patient in detail.  6.Borderline elevated troponin level at0.03.This is likely secondary to demand ischemia due to pneumonia.  Non-trending troponins  7.  Anxiety continue Xanax as needed  Physical therapy recommends no PT needs. Patient is nearing baseline for his COPD. He is afebrile while stable. Will plan discharge hopefully tomorrow if continues to show improvement. Will arrange home health at home  Case discussed with Care Management/Social Worker. Management plans discussed with the patient, family and they are in agreement.  CODE STATUS: *full DVT Prophylaxis: Lovenox  TOTAL TIME TAKING CARE OF THIS PATIENT: 25* minutes.  >50% time spent on counselling and coordination of care  POSSIBLE D/C IN one DAYS, DEPENDING ON CLINICAL CONDITION.  Note: This dictation was prepared with Dragon dictation along with smaller phrase technology. Any transcriptional errors that result from this process are unintentional.  Enedina Finner M.D on 01/27/2018 at 5:30 PM  Between 7am to 6pm - Pager - [phone]  After 6pm go to [email or web address] - password [email or web address] [facility]  [facility]  Office  [phone]  CC: Primary care physician; Lyndon Code, MDPatient ID: Dustin [name], male   DOB: 04/06/1956, 62 y.o.   MRN: [id number]

## 2018-01-28 ENCOUNTER — Ambulatory Visit: Payer: Medicare Other | Admitting: Physician Assistant

## 2018-01-28 ENCOUNTER — Other Ambulatory Visit: Payer: Self-pay | Admitting: *Deleted

## 2018-01-28 LAB — CULTURE, BLOOD (ROUTINE X 2)
CULTURE: NO GROWTH
CULTURE: NO GROWTH
Special Requests: ADEQUATE
Special Requests: ADEQUATE

## 2018-01-28 LAB — GLUCOSE, CAPILLARY
GLUCOSE-CAPILLARY: 80 mg/dL (ref 65–99)
GLUCOSE-CAPILLARY: 115 mg/dL — AB (ref 65–99)
Glucose-Capillary: 131 mg/dL — ABNORMAL HIGH (ref 65–99)

## 2018-01-28 MED ORDER — AMOXICILLIN-POT CLAVULANATE 875-125 MG PO TABS: 1.0000 | ORAL_TABLET | Freq: Two times a day (BID) | ORAL | 0 refills | Status: DC

## 2018-01-28 NOTE — Care Management Important Message (Signed)
Copy of signed IM left in patient's room.    

## 2018-01-28 NOTE — Progress Notes (Signed)
Pt to be discharged this pm. Iv and tele removed. disch instructions given to pt. disch via w.c. Accompanied by daughter.

## 2018-01-28 NOTE — Patient Outreach (Signed)
[name] Care Management  01/28/2018  Dustin [name] Jun 02, 1956 [id number]  Noted plans for [name] to discharge to home today.   Plan: I will follow up with [name] by phone tomorrow to initiate transition of care services.   Marja Kays MHA,BSN,RN,CCM [facility] Care Management  [phone]

## 2018-01-28 NOTE — Progress Notes (Signed)
[facility]     Assessment and Plan:   COPD exacerbation with acute bronchitis and acute hypoxic respiratory failure. Ongoing nicotine abuse.  -Continue steroids, nebulizers, oxygen, wean down as tolerated. -Discussed smoking cessation. -Advance activity as tolerated. -Okay to discharge from respiratory standpoint.  Outpatient follow-up with pulmonary.  If symptoms persist, may require bronchoscopy to rule out atypical infection.    MRSA PCR 01/23/2018; negative Sputum culture 01/23/2018; mixed respiratory flora.  Date: 01/28/2018  MRN# [id number] Dustin [name] 1955-10-06   Dustin [name] is a 62 y.o. old male seen in follow up for chief complaint of  Chief Complaint  Patient presents with  . Shortness of Breath     HPI:  Currently the patient remains on nasal cannula 3 L, Augmentin, nebulized budesonide, duo nebs, Solu-Medrol 40 mg every 12 hours.  He is also on theophylline 400 mg daily, Spiriva.  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  Mild bibasilar atelectasis.  Chest x-ray 5/10; bibasilar scarring, otherwise no new findings.    Medication:    Current Facility-Administered Medications:  .  acidophilus ([name]) capsule 1 capsule, 1 capsule, Oral, BID, Cammy Copa, MD, 1 capsule at 01/27/18 2207 .  ALPRAZolam Prudy Feeler) tablet 1 mg, 1 mg, Oral, TID PRN, Cammy Copa, MD, 1 mg at 01/28/18 0604 .  amoxicillin-clavulanate (AUGMENTIN) 875-125 MG per tablet 1 tablet, 1 tablet, Oral, Q12H, Gouru, Aruna, MD, 1 tablet at 01/27/18 2206 .  baclofen (LIORESAL) tablet 5 mg, 5 mg, Oral, BID PRN, Cammy Copa, MD, 5 mg at 01/23/18 1011 .  budesonide (PULMICORT) nebulizer solution 0.25 mg, 0.25 mg, Nebulization, BID, Conforti, John, DO, 0.25 mg at 01/27/18 1947 .  escitalopram (LEXAPRO) tablet 20 mg, 20 mg, Oral, QHS, Cammy Copa, MD, 20 mg at 01/27/18 2206 .   furosemide (LASIX) tablet 20 mg, 20 mg, Oral, Daily, Cammy Copa, MD, 20 mg at 01/27/18 1017 .  gabapentin (NEURONTIN) capsule 300 mg, 300 mg, Oral, TID, Cammy Copa, MD, 300 mg at 01/27/18 2207 .  guaiFENesin-dextromethorphan (ROBITUSSIN DM) 100-10 MG/5ML syrup 5 mL, 5 mL, Oral, Q4H PRN, Cammy Copa, MD, 5 mL at 01/27/18 2207 .  heparin injection 5,000 Units, 5,000 Units, Subcutaneous, Q8H, Cammy Copa, MD, 5,000 Units at 01/28/18 0130 .  insulin aspart (novoLOG) injection 0-9 Units, 0-9 Units, Subcutaneous, TID WC, Gouru, Aruna, MD, 1 Units at 01/28/18 0831 .  ipratropium-albuterol (DUONEB) 0.5-2.5 (3) MG/3ML nebulizer solution 3 mL, 3 mL, Nebulization, QID, Gouru, Aruna, MD, 3 mL at 01/27/18 1947 .  magnesium oxide (MAG-OX) tablet 400 mg, 400 mg, Oral, Daily, Cammy Copa, MD, 400 mg at 01/27/18 1017 .  MEDLINE mouth rinse, 15 mL, Mouth Rinse, BID, Cammy Copa, MD, 15 mL at 01/27/18 2200 .  methylPREDNISolone sodium succinate (SOLU-MEDROL) 40 mg/mL injection 40 mg, 40 mg, Intravenous, Q12H, Gouru, Aruna, MD, 40 mg at 01/27/18 2207 .  potassium chloride (K-DUR,KLOR-CON) CR tablet 10 mEq, 10 mEq, Oral, Daily, Cammy Copa, MD, 10 mEq at 01/27/18 1017 .  rOPINIRole (REQUIP) tablet 0.25 mg, 0.25 mg, Oral, BID, Cammy Copa, MD, 0.25 mg at 01/27/18 2207 .  theophylline (UNIPHYL) 400 MG 24 hr tablet 400 mg, 400 mg, Oral, Mylo Red, MD, 400 mg at 01/28/18 0603 .  tiotropium (SPIRIVA) inhalation capsule 18 mcg, 18 mcg, Inhalation, Daily, Cammy Copa, MD, 18 mcg at 01/27/18 1017   Allergies:  Asa [aspirin]; Bee venom; Codeine; Ibuprofen; and Iodinated  diagnostic agents  Review of Systems: Gen:  Denies  fever, sweats. HEENT: Denies blurred vision. Cvc:  No dizziness, chest pain or heaviness Resp:   Denies hemoptysis Gi: Denies swallowing difficulty, stomach pain. constipation, bowel incontinence Gu:  Denies bladder incontinence, burning urine Ext:   No Joint pain,  stiffness. Skin: No skin rash, easy bruising. Endoc:  No polyuria, polydipsia. Psych: No depression, insomnia. Other:  All other systems were reviewed and found to be negative other than what is mentioned in the HPI.   Physical Examination:   VS: BP 122/89 (BP Location: Right Arm)   Pulse (!) 102   Temp 97.6 F (36.4 C) (Oral)   Resp 18   Ht  (1.803 m)   Wt 208 lb 11.2 oz (94.7 kg)   SpO2 94%   BMI 29.11 kg/m    General Appearance: No distress  Neuro:without focal findings,  speech normal,  HEENT: PERRLA, EOM intact. Pulmonary: normal breath sounds, No wheezing.   CardiovascularNormal S1,S2.  No m/r/g.   Abdomen: Benign, Soft, non-tender. Renal:  No costovertebral tenderness  GU:  Not performed at this time. Endoc: No evident thyromegaly, no signs of acromegaly. Skin:   warm, no rash. Extremities: normal, no cyanosis, clubbing.   LABORATORY PANEL:   CBC Recent Labs  Lab 01/25/18 0540  WBC 14.3*  HGB 14.2  HCT 43.0  PLT 154   ------------------------------------------------------------------------------------------------------------------  Chemistries  Recent Labs  Lab 01/25/18 0540  NA 139  K 5.0  CL 100*  CO2 34*  GLUCOSE 116*  BUN 39*  CREATININE 1.03  CALCIUM 9.0   ------------------------------------------------------------------------------------------------------------------  Cardiac Enzymes Recent Labs  Lab 01/23/18 0607  TROPONINI <0.03   ------------------------------------------------------------  RADIOLOGY:   No results found for this or any previous visit. Results for orders placed during the hospital encounter of 01/22/18  DG Chest 2 View   Narrative CLINICAL DATA:  Cough for several days  EXAM: CHEST - 2 VIEW  COMPARISON:  01/22/2018, chest x-ray from 09/18/2017  FINDINGS: Cardiac shadow is within normal limits. The lungs are hyperaerated bilaterally. Persistent chronic changes in the right middle lobe are noted.  Mild chronic changes in the left base are again seen stable from multiple previous exams. Patchy changes in right lower lobe are again seen. No new focal abnormality is seen. No acute bony abnormality is noted.  IMPRESSION: Chronic right middle lobe and left basilar scarring  Right basilar changes similar to that seen on prior CT examination.   Electronically Signed   By: Alcide Clever M.D.   On: 01/25/2018 17:01    ------------------------------------------------------------------------------------------------------------------  Thank  you for allowing [facility] to assist in the care of your patient. Our recommendations are noted above.  Please contact us if we can be of further service.   Wells Guiles, MD.  [facility] Office Number: [phone]  Santiago Glad, M.D.  Billy Fischer, M.D  01/28/2018

## 2018-01-28 NOTE — Discharge Summary (Signed)
[facility] [place] [facility]   PATIENT NAME: Dustin [name]    MR#:  [id number]  DATE OF BIRTH:  June 19, 1956  DATE OF ADMISSION:  01/22/2018 ADMITTING PHYSICIAN: Cammy Copa, MD  DATE OF DISCHARGE: 01/28/2018  PRIMARY CARE PHYSICIAN: Lyndon Code, MD    ADMISSION DIAGNOSIS:  Healthcare-associated pneumonia [J18.9] COPD exacerbation (HCC) [J44.1]  DISCHARGE DIAGNOSIS:  acute on chronic hypoxic respiratory failure secondary to COPD exacerbation end-stage chronic lung disease on chronic home oxygen pneumonia  SECONDARY DIAGNOSIS:   Past Medical History:  Diagnosis Date  . Allergy   . Anxiety   . Asthma   . Chronic diastolic CHF (congestive heart failure) (HCC)    a. echo 07/2013: EF 60-65%, DD, biatrial dilatation, Ao sclerosis, dilated RV, moderate pulmonary HTN, elevated CV and RA pressures; b. patient reported echo at Dr. Milta Deiters office 02/2015 - his office does not have record of him being a pt there c. echo 11/2015: EF 60-65%, Grade 1 DD, mod-severe pulm pressures  . Chronic respiratory failure (HCC)    a. on 2L via nasal cannula; b. secondary to COPD  . COPD (chronic obstructive pulmonary disease) (HCC)   . Depression   . Emphysema of lung (HCC)   . GERD (gastroesophageal reflux disease)   . Hypertension   . Personal history of tobacco use, presenting hazards to health 08/17/2015  . Tobacco abuse     HOSPITAL COURSE:  DennisWinburnis a61 y.o.malewith a known history of chronic respiratory failure secondary to COPD on 2 L home oxygen, chronic diastolic heart failure, depression anxiety, neuropathy presents from pulmonary office secondary to worsening shortness of breath and productive sputum  1.Acute on chronic respiratory failure with hypoxia due to COPD exacerbation andsecondary to pneumonia. Clinically improving--- patient appearing appears to be at baseline. Continue antibiotics,nebulizer treatments, taper IV steroids---  oral taper,oxygen therapy.  -MRSA PCR is negative discontinue vancomycin Pulmonary is following. Discussed with Dr. Adriana Reams - on oral Augmentin (total 10 days) -continue flutter valve and incentive spirometer  2.HCAP with recurrent pneumonia - Clinically improving  -IV antibiotics changed to p.o. Augmentin   3.Acute COPD exacerbation,see treatment as above under #1.  4.Acute renal failure,likely prerenal,due to poor p.o. Intake.  5.Tobacco abuse.Smoking cessation was discussed with patient in detail.  6.Borderline elevated troponin level at0.03.This is likely secondary to demand ischemia due to pneumonia.Non-trending troponins  7. Anxiety continue Xanax as needed  overall appears at baseline. Patient is okay to discharge home. Physical therapy recommends no PT. Will arrange home health RN to check on patient given his chronic respiratory issue.  CONSULTS OBTAINED:  Treatment Team:  Shane Crutch, MD  DRUG ALLERGIES:   Allergies  Allergen Reactions  . Asa [Aspirin] Other (See Comments)    Reaction: swelling of the right side.  . Bee Venom Swelling  . Codeine Other (See Comments)    Reaction: Difficulty breathing  . Ibuprofen Other (See Comments)    Reaction: Swelling of the right side.  . Iodinated Diagnostic Agents Other (See Comments)    Reaction:  Unknown  Other reaction(s): Unknown    DISCHARGE MEDICATIONS:   Allergies as of 01/28/2018      Reactions   Asa [aspirin] Other (See Comments)   Reaction: swelling of the right side.   Bee Venom Swelling   Codeine Other (See Comments)   Reaction: Difficulty breathing   Ibuprofen Other (See Comments)   Reaction: Swelling of the right side.   Iodinated Diagnostic Agents Other (See Comments)  Reaction:  Unknown  Other reaction(s): Unknown      Medication List    TAKE these medications   acidophilus Caps capsule Take 1 capsule by mouth 2 (two) times daily.    Albuterol Sulfate 108 (90 Base) MCG/ACT Aepb Commonly known as:  PROAIR RESPICLICK Inhale 2 puffs into the lungs every 4 (four) hours as needed (for shortness of breath). Reported on 12/10/2015   ALPRAZolam 1 MG tablet Commonly known as:  XANAX Take 1 mg by mouth 3 (three) times daily as needed for anxiety.   amoxicillin-clavulanate 875-125 MG tablet Commonly known as:  AUGMENTIN Take 1 tablet by mouth every 12 (twelve) hours.   Baclofen 5 MG Tabs Take 5 mg by mouth 2 (two) times daily as needed for muscle spasms.   escitalopram 20 MG tablet Commonly known as:  LEXAPRO Take 20 mg by mouth at bedtime.   furosemide 20 MG tablet Commonly known as:  LASIX Take 1 tablet (20 mg total) by mouth daily.   gabapentin 300 MG capsule Commonly known as:  NEURONTIN TAKE 1 TO 2 CAPSULES BY MOUTH THREE TIMES DAILY   guaiFENesin-dextromethorphan 100-10 MG/5ML syrup Commonly known as:  ROBITUSSIN DM Take 5 mLs by mouth every 4 (four) hours as needed for cough.   ipratropium-albuterol 0.5-2.5 (3) MG/3ML Soln Commonly known as:  DUONEB Take 3 mLs by nebulization 4 (four) times daily as needed (for shortness of breath).   magnesium oxide 400 MG tablet Commonly known as:  MAG-OX Take 400 mg by mouth daily.   mometasone-formoterol 200-5 MCG/ACT Aero Commonly known as:  DULERA Inhale 2 puffs into the lungs 2 (two) times daily.   OPTICHAMBER ADVANTAGE-LG MASK Misc Use as directed with inhaler diag  j44.1   potassium chloride 10 MEQ tablet Commonly known as:  K-DUR,KLOR-CON Take 10 mEq by mouth daily. Repeat if second LASIX taken   predniSONE 20 MG tablet Commonly known as:  DELTASONE Take 2 tablets (40 mg total) by mouth daily with breakfast.   rOPINIRole 0.25 MG tablet Commonly known as:  REQUIP TAKE 1 TABLET BY MOUTH TWICE DAILY FOR CRAMPS   theophylline 400 MG 24 hr tablet Commonly known as:  UNIPHYL Take 400 mg by mouth every morning.   tiotropium 18 MCG inhalation  capsule Commonly known as:  SPIRIVA HANDIHALER Place 1 capsule (18 mcg total) into inhaler and inhale daily. What changed:  Another medication with the same name was removed. Continue taking this medication, and follow the directions you see here.       If you experience worsening of your admission symptoms, develop shortness of breath, life threatening emergency, suicidal or homicidal thoughts you must seek medical attention immediately by calling 911 or calling your MD immediately  if symptoms less severe.  You Must read complete instructions/literature along with all the possible adverse reactions/side effects for all the Medicines you take and that have been prescribed to you. Take any new Medicines after you have completely understood and accept all the possible adverse reactions/side effects.   Please note  You were cared for by a hospitalist during your hospital stay. If you have any questions about your discharge medications or the care you received while you were in the hospital after you are discharged, you can call the unit and asked to speak with the hospitalist on call if the hospitalist that took care of you is not available. Once you are discharged, your primary care physician will handle any further medical issues. Please note that NO  REFILLS for any discharge medications will be authorized once you are discharged, as it is imperative that you return to your primary care physician (or establish a relationship with a primary care physician if you do not have one) for your aftercare needs so that they can reassess your need for medications and monitor your lab values. Today   SUBJECTIVE   No new complaints.  VITAL SIGNS:  Blood pressure 122/89, pulse (!) 102, temperature 97.6 F (36.4 C), temperature source Oral, resp. rate 18, height  (1.803 m), weight 94.7 kg (208 lb 11.2 oz), SpO2 94 %.  I/O:    Intake/Output Summary (Last 24 hours) at 01/28/2018 1029 Last data  filed at 01/28/2018 0905 Gross per 24 hour  Intake 960 ml  Output 1550 ml  Net -590 ml    PHYSICAL EXAMINATION:  GENERAL:  62 y.o.-year-old patient lying in the bed with no acute distress.  EYES: Pupils equal, round, reactive to light and accommodation. No scleral icterus. Extraocular muscles intact.  HEENT: Head atraumatic, normocephalic. Oropharynx and nasopharynx clear.  NECK:  Supple, no jugular venous distention. No thyroid enlargement, no tenderness.  LUNGS: coarse breath sounds bilaterally, no wheezing, rales,rhonchi or crepitation. No use of accessory muscles of respiration.  CARDIOVASCULAR: S1, S2 normal. No murmurs, rubs, or gallops.  ABDOMEN: Soft, non-tender, non-distended. Bowel sounds present. No organomegaly or mass.  EXTREMITIES: No pedal edema, cyanosis, or clubbing.  NEUROLOGIC: Cranial nerves II through XII are intact. Muscle strength 5/5 in all extremities. Sensation intact. Gait not checked.  PSYCHIATRIC: The patient is alert and oriented x 3.  SKIN: No obvious rash, lesion, or ulcer.   DATA REVIEW:   CBC  Recent Labs  Lab 01/25/18 0540  WBC 14.3*  HGB 14.2  HCT 43.0  PLT 154    Chemistries  Recent Labs  Lab 01/25/18 0540  NA 139  K 5.0  CL 100*  CO2 34*  GLUCOSE 116*  BUN 39*  CREATININE 1.03  CALCIUM 9.0    Microbiology Results   Recent Results (from the past 240 hour(s))  Blood culture (routine x 2)     Status: None   Collection Time: 01/22/18 11:56 PM  Result Value Ref Range Status   Specimen Description BLOOD RIGHT [facility]  Final   Special Requests   Final    BOTTLES DRAWN AEROBIC AND ANAEROBIC Blood Culture adequate volume   Culture   Final    NO GROWTH 5 DAYS Performed at [facility], [address] [place], [place] [address]    Report Status 01/28/2018 FINAL  Final  Blood culture (routine x 2)     Status: None   Collection Time: 01/22/18 11:56 PM  Result Value Ref Range Status   Specimen Description BLOOD RIGHT HAND   Final   Special Requests   Final    BOTTLES DRAWN AEROBIC AND ANAEROBIC Blood Culture adequate volume   Culture   Final    NO GROWTH 5 DAYS Performed at [facility], [address], [place], [place] [address]    Report Status 01/28/2018 FINAL  Final  Culture, sputum-assessment     Status: None   Collection Time: 01/23/18  1:42 AM  Result Value Ref Range Status   Specimen Description EXPECTORATED SPUTUM  Final   Special Requests NONE  Final   Sputum evaluation   Final    THIS SPECIMEN IS ACCEPTABLE FOR SPUTUM CULTURE Performed at [facility], [address], [place], [place] [address]  Report Status 01/23/2018 FINAL  Final  Culture, respiratory (NON-Expectorated)     Status: None   Collection Time: 01/23/18  1:42 AM  Result Value Ref Range Status   Specimen Description   Final    EXPECTORATED SPUTUM Performed at [facility], [address] [place], [place] [address]    Special Requests   Final    NONE Reflexed from [id number] Performed at [facility], [address] [place], [place] [address]    Gram Stain   Final    RARE WBC PRESENT, PREDOMINANTLY PMN FEW GRAM POSITIVE COCCI IN CLUSTERS FEW GRAM POSITIVE RODS RARE GRAM NEGATIVE RODS    Culture   Final    Consistent with normal respiratory flora. Performed at [facility], [address] [place], [place] [address]    Report Status 01/25/2018 FINAL  Final  MRSA PCR Screening     Status: None   Collection Time: 01/23/18  2:52 PM  Result Value Ref Range Status   MRSA by PCR NEGATIVE NEGATIVE Final    Comment:        The GeneXpert MRSA Assay (FDA approved for NASAL specimens only), is one component of a comprehensive MRSA colonization surveillance program. It is not intended to diagnose MRSA infection nor to guide or monitor treatment for MRSA infections. Performed at [facility], [address] [place], [place] [address]     RADIOLOGY:  No results  found.   Management plans discussed with the patient, family and they are in agreement.  CODE STATUS:     Code Status Orders  (From admission, onward)        Start     Ordered   01/23/18 0142  Full code  Continuous     01/23/18 0141    Code Status History    Date Active Date Inactive Code Status Order ID Comments User Context   01/15/2018 1038 01/21/2018 2000 Full Code [id number]  Enid Baas, MD Inpatient   11/22/2017 1747 11/29/2017 1654 Full Code [id number]  Kathlene Cote, PA-C Inpatient   09/19/2017 0459 09/24/2017 1936 Full Code [id number]  Arnaldo Natal, MD ED   08/06/2017 1822 08/14/2017 1304 Full Code [id number]  Alford Highland, MD ED   06/28/2017 2304 07/03/2017 2113 Full Code [id number]  A[place]urn Bilberry, MD ED   01/22/2017 2129 01/29/2017 1944 Full Code [id number]  [place] Siren, MD Inpatient   06/23/2016 0001 06/28/2016 1943 Full Code [id number]  Oralia Manis, MD Inpatient   03/25/2016 1703 03/31/2016 2308 Full Code [id number]  Standley Brooking, MD Inpatient   11/30/2015 0512 12/06/2015 2134 Full Code [id number]  Arnaldo Natal, MD Inpatient   04/13/2015 1722 04/21/2015 1940 Full Code [id number]  A[place]urn Bilberry, MD Inpatient   03/31/2015 2050 04/05/2015 2048 Full Code [id number]  Hower, Cletis Athens, MD ED    Advance Directive Documentation     Most Recent Value  Type of Advance Directive  Healthcare Power of Attorney  Pre-existing out of facility DNR order (yellow form or pink MOST form)  -  "MOST" Form in Place?  -      TOTAL TIME TAKING CARE OF THIS PATIENT: *40* minutes.    Enedina Finner M.D on 01/28/2018 at 10:29 AM  Between 7am to 6pm - Pager - [phone] After 6pm go to [email or web address] - password [email or web address]  [facility]  Office  [phone]  CC: Primary care physician; Lyndon Code, MD

## 2018-01-28 NOTE — Progress Notes (Signed)
Ms.  Dustin [name], patient's sister and HCPOA called and  requested  to  talk to  the pulmonologist in the morning. Per her statement patient told her Dr. Allena Katz informed him he would be D/C today and she wasn't happy  about. She was concerned patient still has pneumonia not cleared by the ABT. She said the same thing happened last admission and patient came right back to the hospital the next day.  Her # [phone]

## 2018-01-28 NOTE — Discharge Instructions (Signed)
User oxygen, nebulizer, inhaler as before

## 2018-01-29 ENCOUNTER — Other Ambulatory Visit: Payer: Self-pay | Admitting: *Deleted

## 2018-01-29 NOTE — Patient Outreach (Addendum)
[name]) Care Management  01/29/2018  Dustin [name] August 31, 1956 [id number]  Transition of Care Outreach Call #1:   Dustin [name] an 62 y.o.gentleman with past medical history which includes COPD (Gold IV Classificaton), asthma, hypertension, anxiety and depression, and GERD who has had4inpatient hospital admissions in Dustin last 6 months, all for respiratory ailments:   09/18/17-09/24/17: COPD exacerbation with HCAP 11/22/17-11/29/17: COPD exacerbation/respiratory failure with intubation (Pulmonolgy f/u appointments 12/18/17 & 01/15/18) 01/15/18 - 01/21/18: COPD exacerbation and acute/chronic CHF 5/719-01/28/18: COPD exacerbation   COPD Management- Dustin [name] taking his medications as prescribed, using O2 @ 2l/[place] as prescribed, is seeing his providers as scheduled .He is able to verbalize EARLY signs and symptoms of COPD and when to call for help. However, Dustin [name] has continued to smoke and admits he is under "an enormous amount of stress" because of his living situation. He has been living with his son and daughter in law but is currently living with his daughter in [place].   Smoking Cessation - every member of Dustin care team has had ongoing conversation and provided education about smoking cessation; Dustin [name] indicates that he is not ready to quit because of Dustin stress in his life and says he doesn't feel he can do it right now. He is taking Xanax as prescribed which he says does help with anxiety and stress.   Level of Care Concerns - at this time, Dustin [name] declines consideration of a higher level of care. He wishes to move into an apartment and live alone. Dustin Maxin [name] and Dustin [name] BSW are helping investigate options in Dustin community with Dustin [name]. I have updated them today on Dustin [name]'s status.   Home Health - Dustin [name] has been active with either [facility] or [name] for quite some time. He tells me his home health  nurse called yesterday and is scheduled to visit in Dustin next few days.   CHF Management - Dustin [name] was treated with IV Lasix during his 01/15/18 hospitalization and diuresed of > 2l. He is taking oral Lasix as prescribed and is weighing himself daily. He can verbalize understanding of EARLY signs and symptoms of CHF and when to call his provider for help.    Provider Appointments/Follow Up:  Dustin [name] 01/30/18 11:30am (Primary Care - post hospital follow up) Dustin Fischer MD 02/04/18 11:20am (Pulmonary)  Dustin Ana McLean-Scocuzza MD 02/14/18 1:30pm (new primary care)  [facility] CM Care Plan Problem One     Most Recent Value  Care Plan Problem One  Knowledge Deficits related to changes in pulmonary plan of care  Role Documenting Dustin Problem One  Care Management Coordinator  Care Plan for Problem One  Active  [facility] Long Term Goal   Over Dustin next 31 days, patient will verbalize and demonstrate knowledge of changes to plan of care for management of pulmonary disease as outlined by new pulmonnary provider  [facility] Long Term Goal Start Date  12/19/17  Interventions for Problem One Long Term Goal  transition of care assessment performed,  reviewed AVS and discharge instructions and upcoming appointments  [facility] CM Short Term Goal #1   Over Dustin next 30 days, patient will report new or worsened symptoms to pulmonary or prmary care or 911 as appropriate  [facility] CM Short Term Goal #1 Start Date  01/22/18  Interventions for Short Term Goal #1  reviewed EARLY signs and symptoms of COPD and when to report vs when to contact EMS  [facility] CM Short Term Goal #2   Over Dustin next 30 days, patient will take all medications including inhalers as prescribed   [facility] CM Short Term Goal #2 Start Date  12/19/17  Interventions for Short Term Goal #2  medications reviewed    [facility] CM Care Plan Problem Two     Most Recent Value  Care Plan Problem Two  Financial Barriers related to Medication Mangaement  Role Documenting Dustin Problem Two  Care  Management Coordinator  Care Plan for Problem Two  Active  [facility] Long Term Goal  Over Dustin next 31 days, patient will verbalize understanding of assistance available for inhalers ordered   [facility] Long Term Goal Start Date  12/19/17  [facility] CM Short Term Goal #1   Over Dustin next 7 days, patient will verbalize receipt of status of patient assistance applicaitons from PCP office  [facility] CM Short Term Goal #1 Start Date  12/19/17    [facility] CM Care Plan Problem Three     Most Recent Value  Care Plan Problem Three  Long Term Housing Needs  Role Documenting Dustin Problem Three  Care Management Coordinator  Care Plan for Problem Three  Active  [facility] Long Term Goal   Over Dustin next 31 days, patient will verbalize understanding of options available for short and long term housing solutions  [facility] Long Term Goal Start Date  01/10/18  Interventions for Problem Three Long Term Goal  update to social work team re: patient status  [facility] CM Short Term Goal #1   Over Dustin next 7 days, patient will work with [name] to review housing options  [facility] CM Short Term Goal #1 Start Date  01/29/18  Interventions for Short Term Goal #1  collaboration with social work team      Dustin [name] [facility] Care Management  [phone]

## 2018-01-30 ENCOUNTER — Ambulatory Visit: Payer: Self-pay | Admitting: Internal Medicine

## 2018-01-31 ENCOUNTER — Other Ambulatory Visit: Payer: Self-pay

## 2018-01-31 NOTE — Patient Outreach (Signed)
[name]) Care Management  01/31/2018  Dustin [name] Apr 03, 1956 [id number]   BSW spoke with patient regarding social work referral for assistance with locating housing.  Patient reported that he is temporarily living with his daughter and her friend in [place].  He prefers to find housing in that county but is open to [place] as well.  Patient reported that he is currently on the wait list for [facility] and [employer] in [place].  He reported that prior to hospitalization he had an application for [employer] but has misplaced it.  Patient reported that he has an appointment at [facility] in [place] on 02/07/18 @ 10:45.   BSW offered to schedule home visit to meet with patient and review housing resources.  Patient reported that he prefers the information to be mailed to [address]  [place] [address].  Plan:  -BSW will call Barbaraann Cao and [employer] to determine where patient is on wait list.   -BSW will locate application for [employer] -BSW will attempt to put patient on wait list for [employer] in [place] and [facility].   -BSW will mail housing resources to patient and follow up with him within the next two weeks to ensure receipt and assist with any questions.    [name], BSW Social Worker [phone]

## 2018-02-01 ENCOUNTER — Other Ambulatory Visit: Payer: Self-pay

## 2018-02-01 NOTE — Patient Outreach (Signed)
[name]) Care Management  02/01/2018  Dustin [name] [birth year] [id number]  BSW researched and contacted multiple options for low income housing in [place] and [place].  BSW left message at [facility] regarding the status of patient on their wait list.  BSW attempted to call [facility] multiple times to inquire about status of patient on wait list but did not get answer or option to leave voicemail.   BSW mailed applications to patient for   [facility] and [employer] in [place].  Per patient, he has an appointment at [facility] on 02/07/18.  Information on [facility] was also sent as Print production planner reported that application has to be picked up or filled out in person.  Contact information for [employer] was included in mailing. Applications for the following properties in [facility] were mailed to patient: [address], [address],[place], [place].  Contact information for the [employer] was also mailed.   BSW will follow up with patient within the next week to ensure receipt of applications and to inquire about the status of [place].    [name], BSW Social Worker [phone]

## 2018-02-04 ENCOUNTER — Ambulatory Visit (INDEPENDENT_AMBULATORY_CARE_PROVIDER_SITE_OTHER): Payer: Medicare Other | Admitting: Pulmonary Disease

## 2018-02-04 ENCOUNTER — Other Ambulatory Visit: Payer: Self-pay | Admitting: *Deleted

## 2018-02-04 ENCOUNTER — Encounter: Payer: Self-pay | Admitting: Pulmonary Disease

## 2018-02-04 VITALS — BP 132/78 | HR 127 | Ht 71.0 in | Wt 211.0 lb

## 2018-02-04 DIAGNOSIS — J432 Centrilobular emphysema: Secondary | ICD-10-CM | POA: Diagnosis not present

## 2018-02-04 DIAGNOSIS — J9611 Chronic respiratory failure with hypoxia: Secondary | ICD-10-CM

## 2018-02-04 DIAGNOSIS — Z7951 Long term (current) use of inhaled steroids: Secondary | ICD-10-CM | POA: Diagnosis not present

## 2018-02-04 DIAGNOSIS — J189 Pneumonia, unspecified organism: Secondary | ICD-10-CM | POA: Diagnosis not present

## 2018-02-04 DIAGNOSIS — I110 Hypertensive heart disease with heart failure: Secondary | ICD-10-CM | POA: Diagnosis not present

## 2018-02-04 DIAGNOSIS — G629 Polyneuropathy, unspecified: Secondary | ICD-10-CM | POA: Diagnosis not present

## 2018-02-04 DIAGNOSIS — J449 Chronic obstructive pulmonary disease, unspecified: Secondary | ICD-10-CM

## 2018-02-04 DIAGNOSIS — K219 Gastro-esophageal reflux disease without esophagitis: Secondary | ICD-10-CM | POA: Diagnosis not present

## 2018-02-04 DIAGNOSIS — Z7952 Long term (current) use of systemic steroids: Secondary | ICD-10-CM | POA: Diagnosis not present

## 2018-02-04 DIAGNOSIS — Z9981 Dependence on supplemental oxygen: Secondary | ICD-10-CM | POA: Diagnosis not present

## 2018-02-04 DIAGNOSIS — J441 Chronic obstructive pulmonary disease with (acute) exacerbation: Secondary | ICD-10-CM | POA: Diagnosis not present

## 2018-02-04 DIAGNOSIS — J45909 Unspecified asthma, uncomplicated: Secondary | ICD-10-CM | POA: Diagnosis not present

## 2018-02-04 DIAGNOSIS — I5033 Acute on chronic diastolic (congestive) heart failure: Secondary | ICD-10-CM | POA: Diagnosis not present

## 2018-02-04 NOTE — Progress Notes (Signed)
PULMONARY OFFICE FOLLOW-UP NOTE  Requesting MD/Service: Self Date of initial consultation: 12/18/17 Reason for consultation: severe COPD, recent intubation for AECOPD  PT PROFILE: 62 y.o. male smoker (up to 2 PPD previously X 40+ yrs, now occasional, none since recent hospitalization) hospitalized 03/07-03/14/19 with ventilator dependent respiratory failure due to AECOPD, PNA. Respiratory virus panel was negative for influenza and positive for rhinovirus. Intubated 03/07 and extubated 03/10. Has prior history of O2 dependent COPD (baseline 2 LPM [place]).   DATA: 06/11/17 CT chest: very severe emphysema. Nonspecific RUL irregular opacity. 9 mm right lower lobe nodule is stable dating back to 11/30/2015 11/23/17 Echocardiogram: LVEF 50-55%, grade I diastolic dysfunction. Mild RV hypokinesis of the apex, RVSP est 45 mmHg 01/22/18 CT chest: Very severe emphysema. New right lower lobe pneumonia. Probable left lower lobe airspace disease is well.    INTERVAL: Last seen by me 04/30 and admitted for AECOPD. Hospitalized 04/30-05/06. Rehospitalized 05/08 - 05/13 for HCAP.   SUBJ:  Post hospital visit. He is nearly back to his baseline. He remains markedly limited by dyspnea. He has completed abx and course of prednisone. He has not smoked since admission. Denies CP, fever, purulent sputum, hemoptysis, LE edema and calf tenderness.   Vitals:   02/04/18 1121 02/04/18 1129  BP:  132/78  Pulse:  (!) 127  SpO2:  96%  Weight: 211 lb (95.7 kg)   Height:  (1.803 m)   3.5 LPM [place]  EXAM:  Gen: No overt distress. In WC HEENT:  NCAT, sclerae white Neck: No JVD noted Lungs: BS markedly diminished. No wheezes Cardiovascular: Reg, no M Abdomen: Obese, soft, NT Ext: 2-3+ symmetric ankle and pedal edema Neuro: No focal deficits  DATA:   BMP Latest Ref Rng & Units 01/25/2018 01/22/2018 01/20/2018  Glucose 65 - 99 mg/dL 960(A) 540(J) 99  BUN 6 - 20 mg/dL 81(X) 91(Y) 78(G)  Creatinine 0.61 - 1.24 mg/dL  9.56 2.13(Y) 8.65  Sodium 135 - 145 mmol/L 139 140 140  Potassium 3.5 - 5.1 mmol/L 5.0 4.6 4.3  Chloride 101 - 111 mmol/L 100(L) 100(L) 98(L)  CO2 22 - 32 mmol/L 34(H) 30 37(H)  Calcium 8.9 - 10.3 mg/dL 9.0 9.2 7.8(I)    CBC Latest Ref Rng & Units 01/25/2018 01/22/2018 01/16/2018  WBC 3.8 - 10.6 K/uL 14.3(H) 18.5(H) 12.8(H)  Hemoglobin 13.0 - 18.0 g/dL 69.6 29.5 28.4  Hematocrit 40.0 - 52.0 % 43.0 50.6 42.7  Platelets 150 - 440 K/uL 154 168 182    CXR 5/10: changes of COPD. R basilar IS prominence - ? Acute vs chronic   I have personally reviewed all chest radiographs reported above including CXRs and CT chest unless otherwise indicated  IMPRESSION:     ICD-10-CM   1. COPD, very severe (HCC) J44.9   2. Chronic hypoxemic respiratory failure (HCC) J96.11   3. Very severe emphysema J43.2 DG Chest 2 View  4. Recent HCAP J18.9 DG Chest 2 View    PLAN:  Continue Dulera, Spiriva, theophylline as maintenance medications  Continue oxygen therapy 24/7-  tirate to maintain SpO2 > 90% Continue albuterol inhaler as needed Follow-up in 6 weeks with CXR prior to that visit  Billy Fischer, MD PCCM service Mobile [phone] Pager [phone] 02/06/2018 4:24 PM

## 2018-02-04 NOTE — Patient Instructions (Signed)
Continue Dulera, Spiriva, theophylline as maintenance medications  Continue oxygen therapy 24/7 Continue albuterol inhaler as needed Follow-up in 6 weeks with CXR prior to that visit

## 2018-02-04 NOTE — Patient Outreach (Signed)
[name]) Care Management  02/04/2018  Dustin [name] [birth year] [id number]  Emmi Notification received re: automated COPD transition call to [name] yesterday. [name] could not be reached today but it is noted that he showed up for his pulmonology appointment with Dr. Billy Fischer.   Plan: [name] is scheuduled for a a transition call later this week.    Marja Kays MHA,BSN,RN,CCM [facility] Care Management  [phone]

## 2018-02-05 DIAGNOSIS — J441 Chronic obstructive pulmonary disease with (acute) exacerbation: Secondary | ICD-10-CM | POA: Diagnosis not present

## 2018-02-05 DIAGNOSIS — G629 Polyneuropathy, unspecified: Secondary | ICD-10-CM | POA: Diagnosis not present

## 2018-02-05 DIAGNOSIS — I5033 Acute on chronic diastolic (congestive) heart failure: Secondary | ICD-10-CM | POA: Diagnosis not present

## 2018-02-05 DIAGNOSIS — Z7951 Long term (current) use of inhaled steroids: Secondary | ICD-10-CM | POA: Diagnosis not present

## 2018-02-05 DIAGNOSIS — Z9981 Dependence on supplemental oxygen: Secondary | ICD-10-CM | POA: Diagnosis not present

## 2018-02-05 DIAGNOSIS — J45909 Unspecified asthma, uncomplicated: Secondary | ICD-10-CM | POA: Diagnosis not present

## 2018-02-05 DIAGNOSIS — K219 Gastro-esophageal reflux disease without esophagitis: Secondary | ICD-10-CM | POA: Diagnosis not present

## 2018-02-05 DIAGNOSIS — Z7952 Long term (current) use of systemic steroids: Secondary | ICD-10-CM | POA: Diagnosis not present

## 2018-02-05 DIAGNOSIS — J9611 Chronic respiratory failure with hypoxia: Secondary | ICD-10-CM | POA: Diagnosis not present

## 2018-02-05 DIAGNOSIS — I110 Hypertensive heart disease with heart failure: Secondary | ICD-10-CM | POA: Diagnosis not present

## 2018-02-06 ENCOUNTER — Other Ambulatory Visit: Payer: Self-pay | Admitting: *Deleted

## 2018-02-06 ENCOUNTER — Encounter: Payer: Self-pay | Admitting: Pulmonary Disease

## 2018-02-06 NOTE — Patient Outreach (Signed)
[facility]) Care Management  02/06/2018  Dustin [name] August 13, 1956 [id number]  Transition of Care Outreach Call #2:  Edwards Mckelvie Dustin [name] an 62 y.o.gentleman with past medical history which includes COPD (Gold IV Classificaton), asthma, hypertension, anxiety and depression, and GERD who has had4inpatient hospital admissions in the last 6 months, all for respiratory ailments:   09/18/17-09/24/17: COPD exacerbation with HCAP 11/22/17-11/29/17: COPD exacerbation/respiratory failure with intubation (Pulmonolgy f/u appointments 12/18/17 & 01/15/18) 01/15/18 - 01/21/18: COPD exacerbation and acute/chronic CHF 5/719-01/28/18: COPD exacerbation  COPD Management- [name] taking his medications as prescribed, using O2 @ 2l/[place] as prescribed, and saw Dr. [name] in the office on Monday 02/04/18. He is able to verbalize EARLY signs and symptoms of COPD and when to call for help.  Today, [name] says he feels well and despite this week's heat and the stress of driving himself to his MD appointment on Monday, arrived with an O2 Saturation of 96% on 2l/c. He says his office appointment went well and Dr. [name] was pleased with his progress.   Smoking Cessation - every member of the care team has had ongoing conversation and provided education about smoking cessation; [name] indicates that he is not ready to quit because of the stress in his life and says he doesn't feel he can do it right now. He is taking Xanax as prescribed which he says does help with anxiety and stress.   Level of Care Concerns - at this time, [name] declines consideration of a higher level of care. He wishes to move into an apartment and live alone. [name] [name] and [employer] BSW are helping investigate options in the community with [name]. I have updated them today on Mr. Quayle's status.   Mr. Mcpheeters is again living with his son in [place] and he says at this time, although  some tension is felt, the situation has generally been better. [name] is scheduled for an interview with one of the apartment complexes where he has applied and feels like he is "making progress." He says his stress feels manageable today.   Home Health - [name] has been active with either [facility] or [name] for quite some time. His home heath nurse is visiting regularly and he feels well supported by the home care team.   CHF Management- [name] was treated with IV Lasix during his 01/15/18 hospitalization and diuresed of >2l. Today, he reports no swelling or other sign or symptom of CHF. He is taking oral Lasix as prescribed and is weighing himself daily. He can verbalize understanding of EARLY signs and symptoms of CHF and when to call his provider for help.    Medication Management - [name] says he is confused about what to do about his inhalers. He has submitted information to the provider office but hasn't heard from them. He has inhalers on hand but is concerned about running out. He wishes to speak with our pharmacy team re: follow up. I will refer him.   Provider Appointments/Follow Up:  [name] 02/14/18 1:30pm (new primary care)  Plan: I will follow up with [name] again by phone next week.   [facility] CM Care Plan Problem One     Most Recent Value  Care Plan Problem One  Knowledge Deficits related to changes in pulmonary plan of care  Role Documenting the Problem One  Care Management [place] for Problem One  Active  THN Long Term Goal   Over the next 31 days, patient will verbalize and demonstrate knowledge of changes to plan of care for management of pulmonary disease as outlined by new pulmonnary provider  [place] Term Goal Start Date  12/19/17  Interventions for Problem One Long Term Goal  transition of care call,  f/u pulmonary office visit  [facility] CM Short Term Goal #1   Over the next 30 days, patient will  report new or worsened symptoms to pulmonary or prmary care or 911 as appropriate  [facility] CM Short Term Goal #1 Start Date  01/22/18  Interventions for Short Term Goal #1  symptom assesment performed,  review of action plan completed  TH[facility] CM Short Term Goal #2   Over the next 30 days, patient will take all medications including inhalers as prescribed   [facility] CM Short Term Goal #2 Start Date  12/19/17  Interventions for Short Term Goal #2  medications reviewed,  discussed need for assistance with inhalers    [name] CM Care Plan Problem Two     Most Recent Value  Care Plan Problem Two  Financial Barriers related to Medication Mangaement  Role Documenting the Problem Two  Care Management [place] for Problem Two  Active  Interventions for Problem Two Long Term Goal   notified pharmacy team of needed assistance  [facility] Long Term Goal  Over the next 31 days, patient will verbalize understanding of assistance available for inhalers ordered   THN Long Term Goal Start Date  12/19/17  [facility] CM Short Term Goal #1   Over the next 7 days, patient will verbalize receipt of status of patient assistance applicaitons from PCP office  [facility]N CM Short Term Goal #1 Start Date  12/19/17  Interventions for Short Term Goal #2   pharmacy referral made    [facility] CM Care Plan Problem Three     Most Recent Value  Care Plan Problem [place]  Role Documenting the Problem [place] for Problem Three  Active  THN Long Term Goal   Over the next 31 days, patient will verbalize understanding of options available for short and long term housing solutions  [facility]N Long Term Goal Start Date  01/10/18  Interventions for Problem Three Long Term Goal  discussed thursday apartment interview appointment and work with social work team  [facility] CM Short Term Goal #1   Over the next 7 days, patient will work with [name] to review housing options  [facility] CM Short Term Goal #1 Start Date   01/29/18  [facility] CM Short Term Goal #1 Met Date  02/06/18      [facility] Care Management  [phone]

## 2018-02-07 ENCOUNTER — Telehealth: Payer: Self-pay

## 2018-02-07 DIAGNOSIS — K219 Gastro-esophageal reflux disease without esophagitis: Secondary | ICD-10-CM | POA: Diagnosis not present

## 2018-02-07 DIAGNOSIS — Z9981 Dependence on supplemental oxygen: Secondary | ICD-10-CM | POA: Diagnosis not present

## 2018-02-07 DIAGNOSIS — G629 Polyneuropathy, unspecified: Secondary | ICD-10-CM | POA: Diagnosis not present

## 2018-02-07 DIAGNOSIS — I5033 Acute on chronic diastolic (congestive) heart failure: Secondary | ICD-10-CM | POA: Diagnosis not present

## 2018-02-07 DIAGNOSIS — I110 Hypertensive heart disease with heart failure: Secondary | ICD-10-CM | POA: Diagnosis not present

## 2018-02-07 DIAGNOSIS — J441 Chronic obstructive pulmonary disease with (acute) exacerbation: Secondary | ICD-10-CM | POA: Diagnosis not present

## 2018-02-07 DIAGNOSIS — Z7952 Long term (current) use of systemic steroids: Secondary | ICD-10-CM | POA: Diagnosis not present

## 2018-02-07 DIAGNOSIS — Z7951 Long term (current) use of inhaled steroids: Secondary | ICD-10-CM | POA: Diagnosis not present

## 2018-02-07 DIAGNOSIS — J9611 Chronic respiratory failure with hypoxia: Secondary | ICD-10-CM | POA: Diagnosis not present

## 2018-02-07 DIAGNOSIS — J45909 Unspecified asthma, uncomplicated: Secondary | ICD-10-CM | POA: Diagnosis not present

## 2018-02-07 NOTE — Telephone Encounter (Signed)
Gave verbal order kate from advanced home care OT for 2 times a week for 2 weeks as per heather is ok

## 2018-02-08 ENCOUNTER — Other Ambulatory Visit: Payer: Self-pay

## 2018-02-08 DIAGNOSIS — I5033 Acute on chronic diastolic (congestive) heart failure: Secondary | ICD-10-CM | POA: Diagnosis not present

## 2018-02-08 DIAGNOSIS — Z7951 Long term (current) use of inhaled steroids: Secondary | ICD-10-CM | POA: Diagnosis not present

## 2018-02-08 DIAGNOSIS — Z7952 Long term (current) use of systemic steroids: Secondary | ICD-10-CM | POA: Diagnosis not present

## 2018-02-08 DIAGNOSIS — J45909 Unspecified asthma, uncomplicated: Secondary | ICD-10-CM | POA: Diagnosis not present

## 2018-02-08 DIAGNOSIS — J441 Chronic obstructive pulmonary disease with (acute) exacerbation: Secondary | ICD-10-CM | POA: Diagnosis not present

## 2018-02-08 DIAGNOSIS — Z9981 Dependence on supplemental oxygen: Secondary | ICD-10-CM | POA: Diagnosis not present

## 2018-02-08 DIAGNOSIS — G629 Polyneuropathy, unspecified: Secondary | ICD-10-CM | POA: Diagnosis not present

## 2018-02-08 DIAGNOSIS — I110 Hypertensive heart disease with heart failure: Secondary | ICD-10-CM | POA: Diagnosis not present

## 2018-02-08 DIAGNOSIS — J9611 Chronic respiratory failure with hypoxia: Secondary | ICD-10-CM | POA: Diagnosis not present

## 2018-02-08 DIAGNOSIS — K219 Gastro-esophageal reflux disease without esophagitis: Secondary | ICD-10-CM | POA: Diagnosis not present

## 2018-02-08 NOTE — Patient Outreach (Signed)
[name]) Care Management  02/08/2018  Dustin [name] 08/20/1956 [id number]   Follow up call to patient to ensure receipt of housing resources mailed.  Patient reported that he did receive the resources mailed, however he had an interview at [facility] in [facility] yesterday and should be moving there toward the middle or end of next month.  Patient reported that he is very excited for this opportunity.   BSW talked with patient about putting him on the wait list for [employer] in [place].  Patient requested that this not occur until he has relocated.   BSW will follow up with patient by the end of next month regarding the status of his move and Mobile Me[employer] referral.  [name], BSW Social Worker [phone]

## 2018-02-08 NOTE — Patient Outreach (Signed)
[name]) Care Management  02/08/2018  Dustin [name] 1956/04/18 [id number]  62 year old male referred to [facility] Care Management.   [facility] services requested for medication assistance for his inhalers.  PMHx includes, but not limited to, heart failure, pulmonary hypertension, hypertension and COPD.  Unsuccessful outreach attempt #1 to Mr. Stickler.  Left HIPAA compliant voice message requesting return call.   Plan: Outreach attempt #2 in 3-4 business days.   Will send unsuccessful outreach attempt letter to patient.   [name], PharmD Clinical Pharmacist [name] [phone]

## 2018-02-11 DIAGNOSIS — K219 Gastro-esophageal reflux disease without esophagitis: Secondary | ICD-10-CM | POA: Diagnosis not present

## 2018-02-11 DIAGNOSIS — Z7951 Long term (current) use of inhaled steroids: Secondary | ICD-10-CM | POA: Diagnosis not present

## 2018-02-11 DIAGNOSIS — J441 Chronic obstructive pulmonary disease with (acute) exacerbation: Secondary | ICD-10-CM | POA: Diagnosis not present

## 2018-02-11 DIAGNOSIS — J9611 Chronic respiratory failure with hypoxia: Secondary | ICD-10-CM | POA: Diagnosis not present

## 2018-02-11 DIAGNOSIS — J45909 Unspecified asthma, uncomplicated: Secondary | ICD-10-CM | POA: Diagnosis not present

## 2018-02-11 DIAGNOSIS — I5033 Acute on chronic diastolic (congestive) heart failure: Secondary | ICD-10-CM | POA: Diagnosis not present

## 2018-02-11 DIAGNOSIS — Z7952 Long term (current) use of systemic steroids: Secondary | ICD-10-CM | POA: Diagnosis not present

## 2018-02-11 DIAGNOSIS — G629 Polyneuropathy, unspecified: Secondary | ICD-10-CM | POA: Diagnosis not present

## 2018-02-11 DIAGNOSIS — I110 Hypertensive heart disease with heart failure: Secondary | ICD-10-CM | POA: Diagnosis not present

## 2018-02-11 DIAGNOSIS — Z9981 Dependence on supplemental oxygen: Secondary | ICD-10-CM | POA: Diagnosis not present

## 2018-02-12 DIAGNOSIS — I110 Hypertensive heart disease with heart failure: Secondary | ICD-10-CM | POA: Diagnosis not present

## 2018-02-12 DIAGNOSIS — K219 Gastro-esophageal reflux disease without esophagitis: Secondary | ICD-10-CM | POA: Diagnosis not present

## 2018-02-12 DIAGNOSIS — J45909 Unspecified asthma, uncomplicated: Secondary | ICD-10-CM | POA: Diagnosis not present

## 2018-02-12 DIAGNOSIS — G629 Polyneuropathy, unspecified: Secondary | ICD-10-CM | POA: Diagnosis not present

## 2018-02-12 DIAGNOSIS — Z7951 Long term (current) use of inhaled steroids: Secondary | ICD-10-CM | POA: Diagnosis not present

## 2018-02-12 DIAGNOSIS — I5033 Acute on chronic diastolic (congestive) heart failure: Secondary | ICD-10-CM | POA: Diagnosis not present

## 2018-02-12 DIAGNOSIS — J441 Chronic obstructive pulmonary disease with (acute) exacerbation: Secondary | ICD-10-CM | POA: Diagnosis not present

## 2018-02-12 DIAGNOSIS — Z7952 Long term (current) use of systemic steroids: Secondary | ICD-10-CM | POA: Diagnosis not present

## 2018-02-12 DIAGNOSIS — J9611 Chronic respiratory failure with hypoxia: Secondary | ICD-10-CM | POA: Diagnosis not present

## 2018-02-12 DIAGNOSIS — Z9981 Dependence on supplemental oxygen: Secondary | ICD-10-CM | POA: Diagnosis not present

## 2018-02-13 ENCOUNTER — Telehealth: Payer: Self-pay | Admitting: Pulmonary Disease

## 2018-02-13 ENCOUNTER — Telehealth: Payer: Self-pay | Admitting: Internal Medicine

## 2018-02-13 DIAGNOSIS — I5033 Acute on chronic diastolic (congestive) heart failure: Secondary | ICD-10-CM | POA: Diagnosis not present

## 2018-02-13 DIAGNOSIS — J45909 Unspecified asthma, uncomplicated: Secondary | ICD-10-CM | POA: Diagnosis not present

## 2018-02-13 DIAGNOSIS — G629 Polyneuropathy, unspecified: Secondary | ICD-10-CM | POA: Diagnosis not present

## 2018-02-13 DIAGNOSIS — Z7951 Long term (current) use of inhaled steroids: Secondary | ICD-10-CM | POA: Diagnosis not present

## 2018-02-13 DIAGNOSIS — J9611 Chronic respiratory failure with hypoxia: Secondary | ICD-10-CM | POA: Diagnosis not present

## 2018-02-13 DIAGNOSIS — Z9981 Dependence on supplemental oxygen: Secondary | ICD-10-CM | POA: Diagnosis not present

## 2018-02-13 DIAGNOSIS — K219 Gastro-esophageal reflux disease without esophagitis: Secondary | ICD-10-CM | POA: Diagnosis not present

## 2018-02-13 DIAGNOSIS — Z7952 Long term (current) use of systemic steroids: Secondary | ICD-10-CM | POA: Diagnosis not present

## 2018-02-13 DIAGNOSIS — I110 Hypertensive heart disease with heart failure: Secondary | ICD-10-CM | POA: Diagnosis not present

## 2018-02-13 DIAGNOSIS — J441 Chronic obstructive pulmonary disease with (acute) exacerbation: Secondary | ICD-10-CM | POA: Diagnosis not present

## 2018-02-13 NOTE — Telephone Encounter (Signed)
Informed pt he needs to contact his PCP that has been handling his Lasix. Pt verbalized understanding. Nothing further needed.

## 2018-02-13 NOTE — Telephone Encounter (Signed)
Pt calling stating he was advised by his home health nurse to call States he's currently taking lasix once in the morning 20 mg  He is needing to know if it is okay to take another one in the afternoon for his feet swelling isn't going down much He states he had another provider taking care of this but he switched over to Dr Sung Amabile and wanted to know if he would handle this  Please advise

## 2018-02-14 ENCOUNTER — Other Ambulatory Visit: Payer: Self-pay | Admitting: *Deleted

## 2018-02-14 ENCOUNTER — Ambulatory Visit: Payer: Self-pay

## 2018-02-14 ENCOUNTER — Ambulatory Visit: Payer: Medicare Other | Admitting: Family

## 2018-02-14 ENCOUNTER — Other Ambulatory Visit: Payer: Self-pay

## 2018-02-14 DIAGNOSIS — J9611 Chronic respiratory failure with hypoxia: Secondary | ICD-10-CM | POA: Diagnosis not present

## 2018-02-14 DIAGNOSIS — Z9981 Dependence on supplemental oxygen: Secondary | ICD-10-CM | POA: Diagnosis not present

## 2018-02-14 DIAGNOSIS — I5033 Acute on chronic diastolic (congestive) heart failure: Secondary | ICD-10-CM | POA: Diagnosis not present

## 2018-02-14 DIAGNOSIS — J441 Chronic obstructive pulmonary disease with (acute) exacerbation: Secondary | ICD-10-CM | POA: Diagnosis not present

## 2018-02-14 DIAGNOSIS — Z7952 Long term (current) use of systemic steroids: Secondary | ICD-10-CM | POA: Diagnosis not present

## 2018-02-14 DIAGNOSIS — J45909 Unspecified asthma, uncomplicated: Secondary | ICD-10-CM | POA: Diagnosis not present

## 2018-02-14 DIAGNOSIS — K219 Gastro-esophageal reflux disease without esophagitis: Secondary | ICD-10-CM | POA: Diagnosis not present

## 2018-02-14 DIAGNOSIS — I110 Hypertensive heart disease with heart failure: Secondary | ICD-10-CM | POA: Diagnosis not present

## 2018-02-14 DIAGNOSIS — Z7951 Long term (current) use of inhaled steroids: Secondary | ICD-10-CM | POA: Diagnosis not present

## 2018-02-14 DIAGNOSIS — G629 Polyneuropathy, unspecified: Secondary | ICD-10-CM | POA: Diagnosis not present

## 2018-02-14 NOTE — Patient Outreach (Addendum)
[name]) Care Management  [facility]   02/14/2018  Dustin [name] [birth year] [id number]  62 year old male referred to [facility] Care Management.   [facility] services requested for medication assistance for his inhalers.  PMHx includes, but not limited to, heart failure, pulmonary hypertension, hypertension and COPD.  Successful outreach attempt to Dustin [name].  HIPAA identifiers verified.  Subjective: Dustin [name] reports that he has been in the hospital almost every month since last October for COPD or pneumonia.  He states that he has recently changed to a new pulmonologist, Dr. Sung Amabile.  He reports that his feet are swollen and his PCP told him to take furosemide 20 mg twice daily x 2 days starting today.   He reports using his albuterol MDI ~ 3 times/day and his Duonebs ~ 4 times/day.  He states that he feels advair and ventolin HFA work better for him than [employer] account [employer].   Objective:  Scr 1.03mg /dL on 7/82  Current Medications: Current Outpatient Medications  Medication Sig Dispense Refill  . Albuterol Sulfate (PROAIR RESPICLICK) 108 (90 Base) MCG/ACT AEPB Inhale 2 puffs into the lungs every 4 (four) hours as needed (for shortness of breath). Reported on 12/10/2015 1 each 4  . ALPRAZolam (XANAX) 1 MG tablet Take 1 mg by mouth 3 (three) times daily as needed for anxiety.   0  . baclofen 5 MG TABS Take 5 mg by mouth 2 (two) times daily as needed for muscle spasms. 30 each 0  . escitalopram (LEXAPRO) 20 MG tablet Take 20 mg by mouth at bedtime.  0  . furosemide (LASIX) 20 MG tablet Take 1 tablet (20 mg total) by mouth daily. 30 tablet 5  . gabapentin (NEURONTIN) 300 MG capsule TAKE 1 TO 2 CAPSULES BY MOUTH THREE TIMES DAILY 270 capsule 0  . guaiFENesin-dextromethorphan (ROBITUSSIN DM) 100-10 MG/5ML syrup Take 5 mLs by mouth every 4 (four) hours as needed for cough. 118 mL 0  . ipratropium-albuterol (DUONEB) 0.5-2.5 (3) MG/3ML SOLN Take 3 mLs by nebulization  4 (four) times daily as needed (for shortness of breath).    . magnesium oxide (MAG-OX) 400 MG tablet Take 400 mg by mouth daily.    . mometasone-formoterol (DULERA) 200-5 MCG/ACT AERO Inhale 2 puffs into the lungs 2 (two) times daily. 1 Inhaler 0  . potassium chloride (K-DUR,KLOR-CON) 10 MEQ tablet Take 10 mEq by mouth daily. Repeat if second LASIX taken    . rOPINIRole (REQUIP) 0.25 MG tablet TAKE 1 TABLET BY MOUTH TWICE DAILY FOR CRAMPS 180 tablet 0  . Spacer/Aero-Holding Chambers (OPTICHAMBER ADVANTAGE-LG MASK) MISC Use as directed with inhaler diag  j44.1 2 each 1  . theophylline (UNIPHYL) 400 MG 24 hr tablet Take 400 mg by mouth every morning.     . tiotropium (SPIRIVA HANDIHALER) 18 MCG inhalation capsule Place 1 capsule (18 mcg total) into inhaler and inhale daily. 30 capsule 0   No current facility-administered medications for this visit.     Functional Status: In your present state of health, do you have any difficulty performing the following activities: 01/23/2018 01/15/2018  Hearing? N N  Vision? N N  Difficulty concentrating or making decisions? N N  Walking or climbing stairs? Y Y  Comment - -  Dressing or bathing? N N  Doing errands, shopping? N Y  Quarry manager and eating ? - -  Using the Toilet? - -  In the past six months, have you accidently leaked urine? - -  Do you have problems with loss of bowel control? - -  Managing your Medications? - -  Managing your Finances? - -  Housekeeping or managing your Housekeeping? - -  Some recent data might be hidden    Fall/Depression Screening: Fall Risk  12/19/2017 10/11/2017 10/08/2017  Falls in the past year? No No -  Number falls in past yr: - - 1  Injury with Fall? - - No  Risk for fall due to : - - -  Risk for fall due to: Comment - - -  Follow up - - Falls prevention discussed   PHQ 2/9 Scores 12/19/2017 10/11/2017 10/05/2017 09/26/2017 04/12/2016 03/29/2016 03/02/2016  PHQ - 2 Score 0 PHQ- 9 Score - ASSESSMENT: Date Discharged from Hospital: 01/28/18 Date Medication Reconciliation Performed: 02/14/2018  No new medications were prescribed at discharge.  Patient was recently discharged from hospital and all medications have been reviewed  Drugs sorted by system:  Neurologic/Psychologic: alprazolam, baclofen, escitalopram, gabapentin, ropinirole  Cardiovascular: furosemide, potassium chloride   Pulmonary/Allergy: albuterol MDI, ipratropium/albuterol neb, mometasone-formoterol inhaler, theophylline, tiotropium  Vitamins/Minerals: magensium oxide  Miscellaneous: guaifenesin/dextromethorphan  Medication Assistance: Verified that patient has 50% Partial Extra Help LIS.  He will still have a 15% coinsurance or plan co-pay, whichever is cheaper during the initial coverage phase of Medicare.  His plan co-pay  Is $45 for his inhalers.  Once in the donut hole he would only pay the 15% coinsurance.  Dr. Milta Deiters office had originally started patient assistance applications for his Elwin Sleight and Spiriva, but had not sent them in yet.  He has been receiving samples this whole year.  Requested the office Kyla Balzarine) to fax the applications to [facility] CPhT, Lilla Shook who will complete the patient assistance applications.   [facility] will help apply for Dulera and Proventil HFA inhalers through [employer], which does not require and out of pocket expenditure to apply.  Unfortunately,[employer] the [employer] do not take patient assistance applications if a patient receives any Extra Help LIS. Dustin [name] did state that if he had assistance with some of the inhalers that he might be able afford the $45/mo. co-pay for the Spiriva.  Patient may also need to reach out to the doctor's office for samples.   Plan: Route note to new pulmonologist, Dr. Sung Amabile to verify inhaler regimen before we continue application process.   Route note, to CPhT, Lilla Shook.  [name],  PharmD Clinical Pharmacist [name] [phone]  Addendum: Jolyn Lent message from Dr. Sung Amabile, pulmonologist.   Dustin [name] and ok to switch to any form of albuterol MDI.     Plan:  Will begin application process for [facility] and Proventil HFA.  [name], PharmD Clinical Pharmacist [name] [phone]

## 2018-02-14 NOTE — Patient Outreach (Signed)
[facility]) Care Management  02/14/2018  Dustin [name] 04-09-1956 [id number]  Transition of Care Outreach Call #2:  Dustin [name] an 62 y.o.gentleman with past medical history which includes COPD (Gold IV Classificaton), asthma, hypertension, anxiety and depression, and GERD who has had4inpatient hospital admissions in the last 6 months, all for respiratory ailments:   09/18/17-09/24/17: COPD exacerbation with HCAP 11/22/17-11/29/17: COPD exacerbation/respiratory failure with intubation (Pulmonolgy f/u appointments 12/18/17 & 01/15/18) 01/15/18- 01/21/18:COPD exacerbationand acute/chronic CHF 5/719-01/28/18: COPD exacerbation  COPD Management- [name] taking his medications as prescribed, using O2 @ 2l/[place] as prescribed, and saw Dr. [name] in the office on Monday 02/04/18. He is able to verbalize EARLY signs and symptoms of COPD and when to call for help.  Today, [name] says he feels well and is working to avoid going outside in the high temperatures. His home health nurse continues to make visits and per Mr. Vannice, states his breathing is "clear".   Level of Care Concerns- [name] received the good news last week that he has been approved for housing at an apartment complex in [place] which was his first choice. He is very excited about his upcoming move and feels this will help him with stress management.   CHF Management- [name] reports no swelling or sign or symptom of CHF. He is taking oralLasixas prescribed and is weighing himself daily. He can verbalize understanding of EARLY signs and symptoms of CHF and when to call his provider for help.  Plan: I will continue transition of care outreach calls to [name].   [facility] CM Care Plan Problem One     Most Recent Value  Care Plan Problem One  Knowledge Deficits related to changes in pulmonary plan of care  Role Documenting the Problem One  Care Management Coordinator  Care  Plan for Problem One  Active  THN Long Term Goal   Over the next 31 days, patient will verbalize and demonstrate knowledge of changes to plan of care for management of pulmonary disease as outlined by new pulmonnary provider  [facility] Long Term Goal Start Date  12/19/17  Interventions for Problem One Long Term Goal  assessed current status and reviewed updated plan of care for COPD management  [facility] CM Short Term Goal #1   Over the next 30 days, patient will report new or worsened symptoms to pulmonary or prmary care or 911 as appropriate  [facility] CM Short Term Goal #1 Start Date  01/22/18  Interventions for Short Term Goal #1  reviewed current signs and symptoms and early signs and symptoms of COPD exacerbation   [facility] CM Short Term Goal #2   Over the next 30 days, patient will take all medications including inhalers as prescribed   [facility] CM Short Term Goal #2 Start Date  12/19/17  Interventions for Short Term Goal #2  medications reviewed    [facility] CM Care Plan Problem Two     Most Recent Value  Care Plan Problem Two  Financial Barriers related to Medication Mangaement  Role Documenting the Problem Two  Care Management Coordinator  Care Plan for Problem Two  Active  THN Long Term Goal  Over the next 31 days, patient will verbalize understanding of assistance available for inhalers ordered   THN Long Term Goal Start Date  12/19/17  [facility] CM Short Term Goal #1   Over the next 7 days, patient will verbalize receipt of status of patient assistance applicaitons from PCP office  [facility]  CM Short Term Goal #1 Start Date  12/19/17    [facility] CM Care Plan Problem Three     Most Recent Value  Care Plan Problem Three  Long Term Housing Needs  Role Documenting the Problem Three  Care Management Coordinator  Care Plan for Problem Three  Active  THN Long Term Goal   Over the next 31 days, patient will verbalize understanding of options available for short and long term housing solutions  THN Long Term Goal Start Date  01/10/18   [facility] CM Short Term Goal #1   Over the next 7 days, patient will work with [name] to review housing options  [facility] CM Short Term Goal #1 Start Date  01/29/18  [facility] CM Short Term Goal #1 Met Date  02/06/18     Janalyn Shy MHA,BSN,RN,CCM [facility] Care Management  [phone]

## 2018-02-15 ENCOUNTER — Other Ambulatory Visit: Payer: Self-pay | Admitting: Pharmacy Technician

## 2018-02-15 NOTE — Patient Outreach (Signed)
[name]) Care Management  02/15/2018  Hershal CoriaDennis R Sgroi [birth year] [id number]   Received [employer] patient assistance [facility] [place] HunJennifer Mendenhall for patients Dulera and Proventil HFA inhalers. Prepared patient and provider (Dr. Sung AmabileSimonds) to be mailed out.  Will follow up with patient in 7-10 business days to verify receipt of application.  [name], CPhT [name] Care Management [phone]

## 2018-02-16 DIAGNOSIS — J449 Chronic obstructive pulmonary disease, unspecified: Secondary | ICD-10-CM | POA: Diagnosis not present

## 2018-02-18 DIAGNOSIS — G629 Polyneuropathy, unspecified: Secondary | ICD-10-CM | POA: Diagnosis not present

## 2018-02-18 DIAGNOSIS — Z9981 Dependence on supplemental oxygen: Secondary | ICD-10-CM | POA: Diagnosis not present

## 2018-02-18 DIAGNOSIS — Z7952 Long term (current) use of systemic steroids: Secondary | ICD-10-CM | POA: Diagnosis not present

## 2018-02-18 DIAGNOSIS — J9611 Chronic respiratory failure with hypoxia: Secondary | ICD-10-CM | POA: Diagnosis not present

## 2018-02-18 DIAGNOSIS — K219 Gastro-esophageal reflux disease without esophagitis: Secondary | ICD-10-CM | POA: Diagnosis not present

## 2018-02-18 DIAGNOSIS — I5033 Acute on chronic diastolic (congestive) heart failure: Secondary | ICD-10-CM | POA: Diagnosis not present

## 2018-02-18 DIAGNOSIS — Z7951 Long term (current) use of inhaled steroids: Secondary | ICD-10-CM | POA: Diagnosis not present

## 2018-02-18 DIAGNOSIS — J441 Chronic obstructive pulmonary disease with (acute) exacerbation: Secondary | ICD-10-CM | POA: Diagnosis not present

## 2018-02-18 DIAGNOSIS — I110 Hypertensive heart disease with heart failure: Secondary | ICD-10-CM | POA: Diagnosis not present

## 2018-02-18 DIAGNOSIS — J45909 Unspecified asthma, uncomplicated: Secondary | ICD-10-CM | POA: Diagnosis not present

## 2018-02-19 DIAGNOSIS — J45909 Unspecified asthma, uncomplicated: Secondary | ICD-10-CM | POA: Diagnosis not present

## 2018-02-19 DIAGNOSIS — G629 Polyneuropathy, unspecified: Secondary | ICD-10-CM | POA: Diagnosis not present

## 2018-02-19 DIAGNOSIS — K219 Gastro-esophageal reflux disease without esophagitis: Secondary | ICD-10-CM | POA: Diagnosis not present

## 2018-02-19 DIAGNOSIS — J9611 Chronic respiratory failure with hypoxia: Secondary | ICD-10-CM | POA: Diagnosis not present

## 2018-02-19 DIAGNOSIS — I110 Hypertensive heart disease with heart failure: Secondary | ICD-10-CM | POA: Diagnosis not present

## 2018-02-19 DIAGNOSIS — Z7952 Long term (current) use of systemic steroids: Secondary | ICD-10-CM | POA: Diagnosis not present

## 2018-02-19 DIAGNOSIS — Z7951 Long term (current) use of inhaled steroids: Secondary | ICD-10-CM | POA: Diagnosis not present

## 2018-02-19 DIAGNOSIS — I5033 Acute on chronic diastolic (congestive) heart failure: Secondary | ICD-10-CM | POA: Diagnosis not present

## 2018-02-19 DIAGNOSIS — J441 Chronic obstructive pulmonary disease with (acute) exacerbation: Secondary | ICD-10-CM | POA: Diagnosis not present

## 2018-02-19 DIAGNOSIS — Z9981 Dependence on supplemental oxygen: Secondary | ICD-10-CM | POA: Diagnosis not present

## 2018-02-20 DIAGNOSIS — J45909 Unspecified asthma, uncomplicated: Secondary | ICD-10-CM | POA: Diagnosis not present

## 2018-02-20 DIAGNOSIS — J441 Chronic obstructive pulmonary disease with (acute) exacerbation: Secondary | ICD-10-CM | POA: Diagnosis not present

## 2018-02-20 DIAGNOSIS — Z9981 Dependence on supplemental oxygen: Secondary | ICD-10-CM | POA: Diagnosis not present

## 2018-02-20 DIAGNOSIS — G629 Polyneuropathy, unspecified: Secondary | ICD-10-CM | POA: Diagnosis not present

## 2018-02-20 DIAGNOSIS — I110 Hypertensive heart disease with heart failure: Secondary | ICD-10-CM | POA: Diagnosis not present

## 2018-02-20 DIAGNOSIS — Z7951 Long term (current) use of inhaled steroids: Secondary | ICD-10-CM | POA: Diagnosis not present

## 2018-02-20 DIAGNOSIS — Z7952 Long term (current) use of systemic steroids: Secondary | ICD-10-CM | POA: Diagnosis not present

## 2018-02-20 DIAGNOSIS — K219 Gastro-esophageal reflux disease without esophagitis: Secondary | ICD-10-CM | POA: Diagnosis not present

## 2018-02-20 DIAGNOSIS — J9611 Chronic respiratory failure with hypoxia: Secondary | ICD-10-CM | POA: Diagnosis not present

## 2018-02-20 DIAGNOSIS — I5033 Acute on chronic diastolic (congestive) heart failure: Secondary | ICD-10-CM | POA: Diagnosis not present

## 2018-02-21 DIAGNOSIS — K219 Gastro-esophageal reflux disease without esophagitis: Secondary | ICD-10-CM | POA: Diagnosis not present

## 2018-02-21 DIAGNOSIS — I5033 Acute on chronic diastolic (congestive) heart failure: Secondary | ICD-10-CM | POA: Diagnosis not present

## 2018-02-21 DIAGNOSIS — J9611 Chronic respiratory failure with hypoxia: Secondary | ICD-10-CM | POA: Diagnosis not present

## 2018-02-21 DIAGNOSIS — Z7952 Long term (current) use of systemic steroids: Secondary | ICD-10-CM | POA: Diagnosis not present

## 2018-02-21 DIAGNOSIS — G629 Polyneuropathy, unspecified: Secondary | ICD-10-CM | POA: Diagnosis not present

## 2018-02-21 DIAGNOSIS — J441 Chronic obstructive pulmonary disease with (acute) exacerbation: Secondary | ICD-10-CM | POA: Diagnosis not present

## 2018-02-21 DIAGNOSIS — Z7951 Long term (current) use of inhaled steroids: Secondary | ICD-10-CM | POA: Diagnosis not present

## 2018-02-21 DIAGNOSIS — I110 Hypertensive heart disease with heart failure: Secondary | ICD-10-CM | POA: Diagnosis not present

## 2018-02-21 DIAGNOSIS — Z9981 Dependence on supplemental oxygen: Secondary | ICD-10-CM | POA: Diagnosis not present

## 2018-02-21 DIAGNOSIS — J45909 Unspecified asthma, uncomplicated: Secondary | ICD-10-CM | POA: Diagnosis not present

## 2018-02-21 NOTE — Telephone Encounter (Signed)
Note sent to provider 

## 2018-02-22 ENCOUNTER — Other Ambulatory Visit: Payer: Self-pay | Admitting: *Deleted

## 2018-02-25 DIAGNOSIS — G629 Polyneuropathy, unspecified: Secondary | ICD-10-CM | POA: Diagnosis not present

## 2018-02-25 DIAGNOSIS — Z7951 Long term (current) use of inhaled steroids: Secondary | ICD-10-CM | POA: Diagnosis not present

## 2018-02-25 DIAGNOSIS — J9611 Chronic respiratory failure with hypoxia: Secondary | ICD-10-CM | POA: Diagnosis not present

## 2018-02-25 DIAGNOSIS — I110 Hypertensive heart disease with heart failure: Secondary | ICD-10-CM | POA: Diagnosis not present

## 2018-02-25 DIAGNOSIS — Z7952 Long term (current) use of systemic steroids: Secondary | ICD-10-CM | POA: Diagnosis not present

## 2018-02-25 DIAGNOSIS — K219 Gastro-esophageal reflux disease without esophagitis: Secondary | ICD-10-CM | POA: Diagnosis not present

## 2018-02-25 DIAGNOSIS — I5033 Acute on chronic diastolic (congestive) heart failure: Secondary | ICD-10-CM | POA: Diagnosis not present

## 2018-02-25 DIAGNOSIS — Z9981 Dependence on supplemental oxygen: Secondary | ICD-10-CM | POA: Diagnosis not present

## 2018-02-25 DIAGNOSIS — J45909 Unspecified asthma, uncomplicated: Secondary | ICD-10-CM | POA: Diagnosis not present

## 2018-02-25 DIAGNOSIS — J441 Chronic obstructive pulmonary disease with (acute) exacerbation: Secondary | ICD-10-CM | POA: Diagnosis not present

## 2018-02-26 ENCOUNTER — Encounter: Payer: Self-pay | Admitting: Internal Medicine

## 2018-02-26 ENCOUNTER — Ambulatory Visit
Admission: RE | Admit: 2018-02-26 | Discharge: 2018-02-26 | Disposition: A | Discharge status: Home / Self Care | Payer: Medicare Other | Source: Ambulatory Visit | Attending: Internal Medicine | Admitting: Internal Medicine

## 2018-02-26 ENCOUNTER — Other Ambulatory Visit: Payer: Self-pay

## 2018-02-26 ENCOUNTER — Emergency Department: Payer: Medicare Other

## 2018-02-26 ENCOUNTER — Telehealth: Payer: Self-pay | Admitting: Internal Medicine

## 2018-02-26 ENCOUNTER — Other Ambulatory Visit: Payer: Self-pay | Admitting: *Deleted

## 2018-02-26 ENCOUNTER — Ambulatory Visit (INDEPENDENT_AMBULATORY_CARE_PROVIDER_SITE_OTHER): Payer: Medicare Other | Admitting: Internal Medicine

## 2018-02-26 ENCOUNTER — Emergency Department
Admission: EM | Admit: 2018-02-26 | Discharge: 2018-02-26 | Disposition: A | Discharge status: Home / Self Care | Payer: Medicare Other | Attending: Emergency Medicine | Admitting: Emergency Medicine

## 2018-02-26 VITALS — BP 132/68 | HR 125 | Temp 98.8°F | Ht 71.0 in | Wt 215.6 lb

## 2018-02-26 DIAGNOSIS — I82441 Acute embolism and thrombosis of right tibial vein: Secondary | ICD-10-CM | POA: Insufficient documentation

## 2018-02-26 DIAGNOSIS — J189 Pneumonia, unspecified organism: Secondary | ICD-10-CM | POA: Diagnosis not present

## 2018-02-26 DIAGNOSIS — I272 Pulmonary hypertension, unspecified: Secondary | ICD-10-CM | POA: Diagnosis not present

## 2018-02-26 DIAGNOSIS — I2699 Other pulmonary embolism without acute cor pulmonale: Secondary | ICD-10-CM | POA: Insufficient documentation

## 2018-02-26 DIAGNOSIS — R000 Tachycardia, unspecified: Secondary | ICD-10-CM | POA: Insufficient documentation

## 2018-02-26 DIAGNOSIS — I82890 Acute embolism and thrombosis of other specified veins: Secondary | ICD-10-CM | POA: Insufficient documentation

## 2018-02-26 DIAGNOSIS — I110 Hypertensive heart disease with heart failure: Secondary | ICD-10-CM | POA: Diagnosis not present

## 2018-02-26 DIAGNOSIS — Z1159 Encounter for screening for other viral diseases: Secondary | ICD-10-CM

## 2018-02-26 DIAGNOSIS — J441 Chronic obstructive pulmonary disease with (acute) exacerbation: Secondary | ICD-10-CM | POA: Insufficient documentation

## 2018-02-26 DIAGNOSIS — M7989 Other specified soft tissue disorders: Secondary | ICD-10-CM | POA: Diagnosis not present

## 2018-02-26 DIAGNOSIS — Z125 Encounter for screening for malignant neoplasm of prostate: Secondary | ICD-10-CM

## 2018-02-26 DIAGNOSIS — I82491 Acute embolism and thrombosis of other specified deep vein of right lower extremity: Secondary | ICD-10-CM | POA: Insufficient documentation

## 2018-02-26 DIAGNOSIS — Z13818 Encounter for screening for other digestive system disorders: Secondary | ICD-10-CM | POA: Diagnosis not present

## 2018-02-26 DIAGNOSIS — Z87891 Personal history of nicotine dependence: Secondary | ICD-10-CM | POA: Insufficient documentation

## 2018-02-26 DIAGNOSIS — I82403 Acute embolism and thrombosis of unspecified deep veins of lower extremity, bilateral: Secondary | ICD-10-CM | POA: Insufficient documentation

## 2018-02-26 DIAGNOSIS — I82431 Acute embolism and thrombosis of right popliteal vein: Secondary | ICD-10-CM | POA: Insufficient documentation

## 2018-02-26 DIAGNOSIS — Z79899 Other long term (current) drug therapy: Secondary | ICD-10-CM | POA: Diagnosis not present

## 2018-02-26 DIAGNOSIS — J432 Centrilobular emphysema: Secondary | ICD-10-CM | POA: Diagnosis not present

## 2018-02-26 DIAGNOSIS — I82432 Acute embolism and thrombosis of left popliteal vein: Secondary | ICD-10-CM | POA: Diagnosis not present

## 2018-02-26 DIAGNOSIS — D72829 Elevated white blood cell count, unspecified: Secondary | ICD-10-CM | POA: Diagnosis not present

## 2018-02-26 DIAGNOSIS — I82442 Acute embolism and thrombosis of left tibial vein: Secondary | ICD-10-CM | POA: Diagnosis not present

## 2018-02-26 DIAGNOSIS — I824Z1 Acute embolism and thrombosis of unspecified deep veins of right distal lower extremity: Secondary | ICD-10-CM

## 2018-02-26 DIAGNOSIS — F419 Anxiety disorder, unspecified: Secondary | ICD-10-CM

## 2018-02-26 DIAGNOSIS — I5032 Chronic diastolic (congestive) heart failure: Secondary | ICD-10-CM | POA: Diagnosis not present

## 2018-02-26 DIAGNOSIS — R600 Localized edema: Secondary | ICD-10-CM

## 2018-02-26 DIAGNOSIS — I10 Essential (primary) hypertension: Secondary | ICD-10-CM

## 2018-02-26 DIAGNOSIS — R0602 Shortness of breath: Secondary | ICD-10-CM | POA: Diagnosis not present

## 2018-02-26 DIAGNOSIS — Z0184 Encounter for antibody response examination: Secondary | ICD-10-CM | POA: Diagnosis not present

## 2018-02-26 LAB — CBC
HEMATOCRIT: 37.4 % — AB (ref 40.0–52.0)
Hemoglobin: 12.6 g/dL — ABNORMAL LOW (ref 13.0–18.0)
MCH: 33 pg (ref 26.0–34.0)
MCHC: 33.8 g/dL (ref 32.0–36.0)
MCV: 97.9 fL (ref 80.0–100.0)
Platelets: 241 10*3/uL (ref 150–440)
RBC: 3.82 MIL/uL — ABNORMAL LOW (ref 4.40–5.90)
RDW: 15.3 % — AB (ref 11.5–14.5)
WBC: 8.6 10*3/uL (ref 3.8–10.6)

## 2018-02-26 LAB — COMPREHENSIVE METABOLIC PANEL
ALBUMIN: 3.4 g/dL — AB (ref 3.5–5.0)
ALT: 11 U/L — ABNORMAL LOW (ref 17–63)
ANION GAP: 10 (ref 5–15)
AST: 17 U/L (ref 15–41)
Alkaline Phosphatase: 58 U/L (ref 38–126)
BILIRUBIN TOTAL: 0.6 mg/dL (ref 0.3–1.2)
BUN: 15 mg/dL (ref 6–20)
CALCIUM: 9.3 mg/dL (ref 8.9–10.3)
CO2: 35 mmol/L — ABNORMAL HIGH (ref 22–32)
Chloride: 98 mmol/L — ABNORMAL LOW (ref 101–111)
Creatinine, Ser: 1.22 mg/dL (ref 0.61–1.24)
GFR calc Af Amer: >60 mL/min (ref >60)
GFR calc non Af Amer: >60 mL/min (ref >60)
GLUCOSE: 86 mg/dL (ref 65–99)
POTASSIUM: 3.9 mmol/L (ref 3.5–5.1)
SODIUM: 143 mmol/L (ref 135–145)
TOTAL PROTEIN: 6.7 g/dL (ref 6.5–8.1)

## 2018-02-26 LAB — BRAIN NATRIURETIC PEPTIDE: B Natriuretic Peptide: 158 pg/mL — ABNORMAL HIGH (ref 0.0–100.0)

## 2018-02-26 LAB — TROPONIN I: Troponin I: 0.03 ng/mL (ref <0.03)

## 2018-02-26 MED ORDER — RIVAROXABAN 15 MG PO TABS: 15.0000 mg | ORAL_TABLET | Freq: Once | ORAL | Status: DC

## 2018-02-26 MED ORDER — RIVAROXABAN (XARELTO) VTE STARTER PACK (15 & 20 MG): ORAL_TABLET | ORAL | 0 refills | Status: DC

## 2018-02-26 MED ORDER — IPRATROPIUM-ALBUTEROL 0.5-2.5 (3) MG/3ML IN SOLN
3.0000 mL | Freq: Once | RESPIRATORY_TRACT | Status: AC
  Administered 2018-02-26: 3 mL via RESPIRATORY_TRACT
  Filled 2018-02-26: qty 9

## 2018-02-26 MED ORDER — FUROSEMIDE 40 MG PO TABS: 40.0000 mg | ORAL_TABLET | Freq: Every day | ORAL | 0 refills | Status: DC

## 2018-02-26 MED ORDER — RIVAROXABAN 15 MG PO TABS
15.0000 mg | ORAL_TABLET | Freq: Two times a day (BID) | ORAL | Status: DC
  Administered 2018-02-26: 15 mg via ORAL
  Filled 2018-02-26: qty 1

## 2018-02-26 MED ORDER — PREDNISONE 50 MG PO TABS: 50.0000 mg | ORAL_TABLET | Freq: Every day | ORAL | 0 refills | Status: AC

## 2018-02-26 MED ORDER — IPRATROPIUM-ALBUTEROL 0.5-2.5 (3) MG/3ML IN SOLN
3.0000 mL | Freq: Once | RESPIRATORY_TRACT | Status: AC
  Administered 2018-02-26: 3 mL via RESPIRATORY_TRACT

## 2018-02-26 MED ORDER — IOPAMIDOL (ISOVUE-370) INJECTION 76%
75.0000 mL | Freq: Once | INTRAVENOUS | Status: AC | PRN
  Administered 2018-02-26: 75 mL via INTRAVENOUS

## 2018-02-26 MED ORDER — METHYLPREDNISOLONE SODIUM SUCC 125 MG IJ SOLR
125.0000 mg | Freq: Once | INTRAMUSCULAR | Status: AC
Start: 2018-02-26 — End: 2018-02-26
  Administered 2018-02-26: 125 mg via INTRAVENOUS
  Filled 2018-02-26: qty 2

## 2018-02-26 MED ORDER — ALPRAZOLAM 1 MG PO TABS: 1.0000 mg | ORAL_TABLET | Freq: Three times a day (TID) | ORAL | 2 refills | Status: DC | PRN

## 2018-02-26 MED ORDER — AZITHROMYCIN 250 MG PO TABS: ORAL_TABLET | ORAL | 0 refills | Status: DC

## 2018-02-26 NOTE — ED Triage Notes (Signed)
Pt sent here from ultrasound for positive DVT bilat legs, pt also co shob since today. Pt has COPD but worsening since this am.

## 2018-02-26 NOTE — Progress Notes (Signed)
Pre visit review using our clinic review tool, if applicable. No additional management support is needed unless otherwise documented below in the visit note. 

## 2018-02-26 NOTE — ED Notes (Signed)
Pt states he has not eaten since earlier today and is hungry, pt given sandwich tray and water, pt resting comfortably in bed at this time
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
## 2018-02-26 NOTE — ED Notes (Signed)

## 2018-02-26 NOTE — Discharge Instructions (Signed)
Please begin taking your blood thinning medication as prescribed and make an appointment to follow-up with your primary care physician within 2 days for recheck.  Return to the emergency department sooner for any concerns whatsoever.  It was a pleasure to take care of you today, and thank you for coming to our emergency department.  If you have any questions or concerns before leaving please ask the nurse to grab me and I'm more than happy to go through your aftercare instructions again.  If you were prescribed any opioid pain medication today such as Norco, Vicodin, Percocet, morphine, hydrocodone, or oxycodone please make sure you do not drive when you are taking this medication as it can alter your ability to drive safely.  If you have any concerns once you are home that you are not improving or are in fact getting worse before you can make it to your follow-up appointment, please do not hesitate to call [phone] and come back for further evaluation.  Merrily Brittle, MD  Results for orders placed or performed during the hospital encounter of 02/26/18  CBC  Result Value Ref Range   WBC 8.6 3.8 - 10.6 K/uL   RBC 3.82 (L) 4.40 - 5.90 MIL/uL   Hemoglobin 12.6 (L) 13.0 - 18.0 g/dL   HCT 16.1 (L) 09.6 - 04.5 %   MCV 97.9 80.0 - 100.0 fL   MCH 33.0 26.0 - 34.0 pg   MCHC 33.8 32.0 - 36.0 g/dL   RDW 40.9 (H) 81.1 - 91.4 %   Platelets 241 150 - 440 K/uL  Comprehensive metabolic panel  Result Value Ref Range   Sodium 143 135 - 145 mmol/L   Potassium 3.9 3.5 - 5.1 mmol/L   Chloride 98 (L) 101 - 111 mmol/L   CO2 35 (H) 22 - 32 mmol/L   Glucose, Bld 86 65 - 99 mg/dL   BUN 15 6 - 20 mg/dL   Creatinine, Ser 7.82 0.61 - 1.24 mg/dL   Calcium 9.3 8.9 - 95.6 mg/dL   Total Protein 6.7 6.5 - 8.1 g/dL   Albumin 3.4 (L) 3.5 - 5.0 g/dL   AST 17 15 - 41 U/L   ALT 11 (L) 17 - 63 U/L   Alkaline Phosphatase 58 38 - 126 U/L   Total Bilirubin 0.6 0.3 - 1.2 mg/dL   GFR calc non Af Amer >60 >60 mL/min   GFR calc  Af Amer >60 >60 mL/min   Anion gap 10 5 - 15  Troponin I  Result Value Ref Range   Troponin I 0.03 (HH) <0.03 ng/mL  Brain natriuretic peptide  Result Value Ref Range   B Natriuretic Peptide 158.0 (H) 0.0 - 100.0 pg/mL   Dg Chest 2 View  Result Date: 02/26/2018 CLINICAL DATA:  Positive DVT in BILATERAL lower extremities by ultrasound, shortness of breath today, history COPD, asthma, chronic diastolic CHF, hypertension EXAM: CHEST - 2 VIEW COMPARISON:  01/25/2018 FINDINGS: Upper normal heart size. Mediastinal contours and pulmonary vascularity normal. Emphysematous changes with minimal central peribronchial thickening consistent with COPD. Chronic bibasilar interstitial changes. No definite acute infiltrate, pleural effusion or pneumothorax. Bones demineralized. IMPRESSION: Changes of COPD and chronic basilar interstitial lung disease. No acute abnormalities. Electronically Signed   By: Ulyses Southward M.D.   On: 02/26/2018 19:34   Ct Angio Chest Pe W/cm &/or Wo Cm  Result Date: 02/26/2018 CLINICAL DATA:  Bilateral lower extremity DVT. Shortness of breath today. PE suspected, high pretest prob EXAM: CT ANGIOGRAPHY CHEST WITH  CONTRAST TECHNIQUE: Multidetector CT imaging of the chest was performed using the standard protocol during bolus administration of intravenous contrast. Multiplanar CT image reconstructions and MIPs were obtained to evaluate the vascular anatomy. CONTRAST:  75mL ISOVUE-370 IOPAMIDOL (ISOVUE-370) INJECTION 76% COMPARISON:  Chest radiograph earlier this day.  CT 01/22/2018 FINDINGS: Cardiovascular: Probable subsegmental filling defects within the lingular and lateral left lower lobe pulmonary arteries, motion artifact in this region limits detailed assessment. More central pulmonary arteries are patent. The heart is normal in size. Thoracic aorta is normal caliber with minimal atherosclerosis. Conventional branching pattern from the aortic arch. No pericardial effusion.  Mediastinum/Nodes: Small mediastinal and hilar nodes not enlarged by size criteria. No visualized thyroid nodule. The esophagus is decompressed, postsurgical change at the gastroesophageal junction. Lungs/Pleura: Advanced emphysema.  No pulmonary infarct. Partial collapse of the right middle lobe is unchanged from prior exam, there is filling of the right middle lobe bronchus image 64 series 6, probable retained secretions. Irregular right upper lobe scarring is stable from prior exam. Right lower lobe airspace disease persists with increased consolidative and nodular components in the dependent right lower lobe. A right lower lobe 8 mm nodule in the region of airspace disease image 79 series 6 is unchanged from prior. Bubbly debris in the left lower lobe bronchus. Subpleural nodular opacity in the left lower lobe measuring 9 mm image 78, is new from prior. Additional patchy and nodular opacities in the lateral left lower lobe. Paramediastinal atelectasis in the lingula. No pleural fluid. Upper Abdomen: Surgical clips at the gastroesophageal junction and left upper quadrant. No acute findings. Musculoskeletal: There are no acute or suspicious osseous abnormalities. Review of the MIP images confirms the above findings. Critical Value/emergent results were called by telephone at the time of interpretation on 02/26/2018 at 9:39 pm to Dr. Merrily Brittle , who verbally acknowledged these results. IMPRESSION: 1. Findings suspicious for subsegmental pulmonary emboli in the lingula and left lower lobe, motion artifact also considered but felt less likely. 2. Advanced emphysema. Progression in consolidative and nodular opacities in both lower lobes since exam last month. This may be infectious or inflammatory. Given presence of debris in the bronchi, aspiration is considered. Given the nodular appearance, neoplastic process is not excluded, recommend follow-up chest CT in 3 months. Aortic Atherosclerosis (ICD10-I70.0) and  Emphysema (ICD10-J43.9). Electronically Signed   By: Rubye Oaks M.D.   On: 02/26/2018 21:40   US Venous Img Lower Bilateral  Result Date: 02/26/2018 CLINICAL DATA:  Bilateral lower extremity edema, pain and swelling. EXAM: BILATERAL LOWER EXTREMITY VENOUS DOPPLER ULTRASOUND TECHNIQUE: Gray-scale sonography with graded compression, as well as color Doppler and duplex ultrasound were performed to evaluate the lower extremity deep venous systems from the level of the common femoral vein and including the common femoral, femoral, profunda femoral, popliteal and calf veins including the posterior tibial, peroneal and gastrocnemius veins when visible. The superficial great saphenous vein was also interrogated. Spectral Doppler was utilized to evaluate flow at rest and with distal augmentation maneuvers in the common femoral, femoral and popliteal veins. COMPARISON:  None. FINDINGS: RIGHT LOWER EXTREMITY Common Femoral Vein: No evidence of thrombus. Normal compressibility, respiratory phasicity and response to augmentation. Saphenofemoral Junction: No evidence of thrombus. Normal compressibility and flow on color Doppler imaging. Profunda Femoral Vein: No evidence of thrombus. Normal compressibility and flow on color Doppler imaging. Femoral Vein: No evidence of thrombus. Normal compressibility, respiratory phasicity and response to augmentation. Popliteal Vein: Nonocclusive clot is demonstrated. Calf Veins: Occlusive clot is  noted in the posterior tibial vein. The peroneal vein is not visualized. Superficial Great Saphenous Vein: No evidence of thrombus. Normal compressibility. Venous Reflux:  None. Other Findings:  None. LEFT LOWER EXTREMITY Common Femoral Vein: No evidence of thrombus. Normal compressibility, respiratory phasicity and response to augmentation. Saphenofemoral Junction: No evidence of thrombus. Normal compressibility and flow on color Doppler imaging. Profunda Femoral Vein: No evidence of  thrombus. Normal compressibility and flow on color Doppler imaging. Femoral Vein: No evidence of thrombus. Normal compressibility, respiratory phasicity and response to augmentation. Popliteal Vein: No evidence of thrombus. Normal compressibility, respiratory phasicity and response to augmentation. Calf Veins: The posterior tibial vein is patent. The peroneal vein is not visualized. Occlusive clot is noted in the gastroc veins. Superficial Great Saphenous Vein: No evidence of thrombus. Normal compressibility. Venous Reflux:  None. Other Findings:  None. IMPRESSION: Deep venous thrombosis noted in the right popliteal vein, right posterior tibial vein and left gastroc veins. These results will be called to the ordering clinician or representative by the Radiologist Assistant, and communication documented in the PACS or zVision Dashboard. Electronically Signed   By: Rudie MeyerP.  Gallerani M.D.   On: 02/26/2018 17:58

## 2018-02-26 NOTE — ED Notes (Signed)
Patient transported to CT 

## 2018-02-26 NOTE — ED Notes (Signed)
Pt to room 10 via w/c by EDT Beth; report called to care nurse Kasey; notif of troponin and DVT

## 2018-02-26 NOTE — Patient Instructions (Addendum)
sch appt for labs this week or next week fasting  F/u in 2 weeks with me   Edema Edema is an abnormal buildup of fluids in your bodytissues. Edema is somewhatdependent on gravity to pull the fluid to the lowest place in your body. That makes the condition more common in the legs and thighs (lower extremities). Painless swelling of the feet and ankles is common and becomes more likely as you get older. It is also common in looser tissues, like around your eyes. When the affected area is squeezed, the fluid may move out of that spot and leave a dent for a few moments. This dent is called pitting. What are the causes? There are many possible causes of edema. Eating too much salt and being on your feet or sitting for a long time can cause edema in your legs and ankles. Hot weather may make edema worse. Common medical causes of edema include:  Heart failure.  Liver disease.  Kidney disease.  Weak blood vessels in your legs.  Cancer.  An injury.  Pregnancy.  Some medications.  Obesity.  What are the signs or symptoms? Edema is usually painless.Your skin may look swollen or shiny. How is this diagnosed? Your health care provider may be able to diagnose edema by asking about your medical history and doing a physical exam. You may need to have tests such as X-rays, an electrocardiogram, or blood tests to check for medical conditions that may cause edema. How is this treated? Edema treatment depends on the cause. If you have heart, liver, or kidney disease, you need the treatment appropriate for these conditions. General treatment may include:  Elevation of the affected body part above the level of your heart.  Compression of the affected body part. Pressure from elastic bandages or support stockings squeezes the tissues and forces fluid back into the blood vessels. This keeps fluid from entering the tissues.  Restriction of fluid and salt intake.  Use of a water pill (diuretic).  These medications are appropriate only for some types of edema. They pull fluid out of your body and make you urinate more often. This gets rid of fluid and reduces swelling, but diuretics can have side effects. Only use diuretics as directed by your health care provider.  Follow these instructions at home:  Keep the affected body part above the level of your heart when you are lying down.  Do not sit still or stand for prolonged periods.  Do not put anything directly under your knees when lying down.  Do not wear constricting clothing or garters on your upper legs.  Exercise your legs to work the fluid back into your blood vessels. This may help the swelling go down.  Wear elastic bandages or support stockings to reduce ankle swelling as directed by your health care provider.  Eat a low-salt diet to reduce fluid if your health care provider recommends it.  Only take medicines as directed by your health care provider. Contact a health care provider if:  Your edema is not responding to treatment.  You have heart, liver, or kidney disease and notice symptoms of edema.  You have edema in your legs that does not improve after elevating them.  You have sudden and unexplained weight gain. Get help right away if:  You develop shortness of breath or chest pain.  You cannot breathe when you lie down.  You develop pain, redness, or warmth in the swollen areas.  You have heart, liver, or kidney  disease and suddenly get edema.  You have a fever and your symptoms suddenly get worse. This information is not intended to replace advice given to you by your health care provider. Make sure you discuss any questions you have with your health care provider. Document Released: 09/04/2005 Document Revised: 02/10/2016 Document Reviewed: 06/27/2013 [employer]vier Interactive Patient Education  2017 [employer]

## 2018-02-26 NOTE — Patient Outreach (Addendum)
[name] Care Management  02/22/2018 (late entry)  Dustin CoriaDennis R [name] Nov 25, 1955 [id number]  I reached Mr. Dustin HalimWinburn by phone today to follow up on his COPD management and post hospital progress. When he answered the phone he told me he was napping and asked if I would call him again at the first of next week.   Plan: I will outreach to Dustin [name] next week as requested.    Dustin [name] MHA,BSN,RN,CCM [facility]  [phone]

## 2018-02-26 NOTE — ED Notes (Signed)
[facility]

## 2018-02-26 NOTE — Progress Notes (Signed)
Chief Complaint  Patient presents with  . 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 Anxiety uncontrolled on xanax 1 mg tid prn and lexapro 20 mg declines to seek therapy for now  2. COPD with recent exacerbation 01/15/18 readmitted 01/22/18-01/28/18 for HCAP and COPD and CT chest with RLL infiltrate and scarring and severe COPD ? LLL disease. He is on 2L O2 x continuous recently stopped smoking 12/2017 max 2ppd no FH lung cancer  3. C/o b/l leg edema taking lasix 20 mg qd to bid bid is helping better than qd. Echo 11/23/17 reviewed EF 50-55% mild LVH grade 1 DD mildly dilated RA PAP 45 mmhg 4. HTN controlled on dilt 30 mg qd   Review of Systems  Constitutional: Negative for weight loss.  HENT: Negative for hearing loss.   Eyes: Negative for blurred vision.  Respiratory: Positive for cough, sputum production and shortness of breath.        On 2L O2   Cardiovascular: Positive for leg swelling. Negative for chest pain.  Musculoskeletal: Negative for falls.  Skin: Negative for rash.  Neurological: Negative for headaches.  Psychiatric/Behavioral: The patient is nervous/anxious.    Past Medical History:  Diagnosis Date  . Allergy   . Anxiety   . Asthma   . Chronic diastolic CHF (congestive heart failure) ([place])    a. echo 07/2013: EF 60-65%, DD, biatrial dilatation, Ao sclerosis, dilated RV, moderate pulmonary HTN, elevated CV and RA pressures; b. patient reported echo at Dr. Laurelyn Sickle office 02/2015 - his office does not have record of him being a pt there c. echo 11/2015: EF 60-65%, Grade 1 DD, mod-severe pulm pressures  . Chronic respiratory failure (HCC)    a. on 2L via nasal cannula; b. secondary to COPD  . COPD (chronic obstructive pulmonary disease) (HCC)    on 2L continuous   . Depression   . Emphysema of lung ([place])   . GERD (gastroesophageal reflux disease)   . HCAP (healthcare-associated pneumonia)    01/22/18-01/28/18   . Headache   . Hypertension   .  Personal history of tobacco use, presenting hazards to health 08/17/2015  . Pulmonary HTN ([place])   . Tobacco abuse    Past Surgical History:  Procedure Laterality Date  . ABDOMINAL SURGERY    . ADENOIDECTOMY    . CARPAL TUNNEL RELEASE Right   . corpal tunnel Right   . ELBOW SURGERY Right    repaired tendon  . hemmorhoid N/A   . HEMORRHOID SURGERY    . NASAL SINUS SURGERY    . NASAL SINUS SURGERY     1970s  . NOSE SURGERY    . reflux surgery     1994  . ROTATOR CUFF REPAIR Right   . SHOULDER SURGERY    . TENNIS ELBOW RELEASE/NIRSCHEL PROCEDURE Right   . TONSILLECTOMY    . URETHRA SURGERY     surgery 6 times from age [age] yrs old  . URETHRA SURGERY     Family History  Problem Relation Age of Onset  . CAD Father   . Hyperlipidemia Father   . Stroke Father   . Heart disease Father   . Arthritis Father   . Hearing loss Father   . Hypertension Father   . Hypertension Mother   . Peripheral Artery Disease Mother   . Rheum arthritis Mother   . Asthma Mother   . Bipolar disorder Mother   . Depression Mother   .  Malignant hypertension Mother   . Arthritis Mother   . Hearing loss Mother   . Heart disease Mother   . Hyperlipidemia Mother   . Kidney disease Mother   . Birth defects Brother   . Heart disease Brother   . Alcohol abuse Daughter   . Arthritis Daughter   . Asthma Daughter   . Depression Daughter   . Drug abuse Daughter   . Miscarriages / [place] Daughter   . Intellectual disability Daughter   . Kidney disease Son   . Birth defects Paternal Grandmother   . Depression Paternal Grandmother   . Heart disease Paternal Grandmother   . Birth defects Sister   . Diabetes Neg Hx    Social History   Socioeconomic History  . Marital status: Legally Separated    Spouse name: Not on file  . Number of children: Not on file  . Years of education: Not on file  . Highest education level: Not on file  Occupational History  . Not on file  Social Needs  .  Financial resource strain: Not on file  . Food insecurity:    Worry: Not on file    Inability: Not on file  . Transportation needs:    Medical: Not on file    Non-medical: Not on file  Tobacco Use  . Smoking status: Former Smoker    Packs/day: 2.00    Years: 40.00    Pack years: 80.00    Types: Cigarettes    Last attempt to quit: 01/14/2018    Years since quitting: 0.1  . Smokeless tobacco: Never Used  Substance and Sexual Activity  . Alcohol use: No  . Drug use: No  . Sexual activity: Not Currently  Lifestyle  . Physical activity:    Days per week: 0 days    Minutes per session: 0 min  . Stress: Only a little  Relationships  . Social connections:    Talks on phone: Not on file    Gets together: Not on file    Attends religious service: Not on file    Active member of club or organization: Not on file    Attends meetings of clubs or organizations: Not on file    Relationship status: Not on file  . Intimate partner violence:    Fear of current or ex partner: Not on file    Emotionally abused: Not on file    Physically abused: Not on file    Forced sexual activity: Not on file  Other Topics Concern  . Not on file  Social History Narrative   On 2 L chronic home O2.  Lives at home alone    3 kids    No guns    Wears seat belt    Safe in relationship    Current Meds  Medication Sig  . ALPRAZolam (XANAX) 1 MG tablet Take 1 tablet (1 mg total) by mouth 3 (three) times daily as needed for anxiety.  . Cholecalciferol (VITAMIN D) 2000 units tablet Take 2,000 Units by mouth daily.  Marland Kitchen diltiazem (CARDIZEM) 30 MG tablet Take 30 mg by mouth daily.  Marland Kitchen escitalopram (LEXAPRO) 20 MG tablet Take 20 mg by mouth at bedtime.  . furosemide (LASIX) 40 MG tablet Take 1 tablet (40 mg total) by mouth daily. In am  . gabapentin (NEURONTIN) 300 MG capsule TAKE 1 TO 2 CAPSULES BY MOUTH THREE TIMES DAILY  . magnesium oxide (MAG-OX) 400 MG tablet Take 400 mg by mouth daily.  Marland Kitchen  mometasone-formoterol (DULERA) 200-5 MCG/ACT AERO Inhale 2 puffs into the lungs 2 (two) times daily.  . potassium chloride (K-DUR,KLOR-CON) 10 MEQ tablet Take 10 mEq by mouth daily. Repeat if second LASIX taken  . Probiotic Product (PROBIOTIC PO) Take by mouth.  Marland Kitchen rOPINIRole (REQUIP) 0.25 MG tablet TAKE 1 TABLET BY MOUTH TWICE DAILY FOR CRAMPS  . Spacer/Aero-Holding Chambers (OPTICHAMBER ADVANTAGE-LG MASK) MISC Use as directed with inhaler diag  j44.1  . theophylline (UNIPHYL) 400 MG 24 hr tablet Take 400 mg by mouth every morning.   . Tiotropium Bromide Monohydrate (SPIRIVA RESPIMAT) 2.5 MCG/ACT AERS Inhale into the lungs.  . [DISCONTINUED] ALPRAZolam (XANAX) 1 MG tablet Take 1 mg by mouth 3 (three) times daily as needed for anxiety.   . [DISCONTINUED] furosemide (LASIX) 20 MG tablet Take 1 tablet (20 mg total) by mouth daily.  . [DISCONTINUED] guaiFENesin-dextromethorphan (ROBITUSSIN DM) 100-10 MG/5ML syrup Take 5 mLs by mouth every 4 (four) hours as needed for cough.   Allergies  Allergen Reactions  . Asa [Aspirin] Other (See Comments)    Reaction: swelling of the right side.  . Bee Venom Swelling  . Codeine Other (See Comments)    Reaction: Difficulty breathing  . Ibuprofen Other (See Comments)    Reaction: Swelling of the right side.  . Iodinated Diagnostic Agents Other (See Comments)    Reaction:  Unknown  Other reaction(s): Unknown   Recent Results (from the past 2160 hour(s))  CBC     Status: Abnormal   Collection Time: 11/29/17  6:42 AM  Result Value Ref Range   WBC 12.9 (H) 4.0 - 10.5 K/uL   RBC 4.33 4.22 - 5.81 MIL/uL   Hemoglobin 13.6 13.0 - 17.0 g/dL   HCT 42.3 39.0 - 52.0 %   MCV 97.7 78.0 - 100.0 fL   MCH 31.4 26.0 - 34.0 pg   MCHC 32.2 30.0 - 36.0 g/dL   RDW 12.9 11.5 - 15.5 %   Platelets 254 150 - 400 K/uL    Comment: Performed at [facility], [place] [address], [place], [place] [address]  Basic metabolic panel     Status: Abnormal   Collection Time:  11/29/17  6:42 AM  Result Value Ref Range   Sodium 139 135 - 145 mmol/L   Potassium 3.9 3.5 - 5.1 mmol/L   Chloride 102 101 - 111 mmol/L   CO2 28 22 - 32 mmol/L   Glucose, Bld 94 65 - 99 mg/dL   BUN 40 (H) 6 - 20 mg/dL   Creatinine, Ser 1.22 0.61 - 1.24 mg/dL   Calcium 8.4 (L) 8.9 - 10.3 mg/dL   GFR calc non Af Amer >60 >60 mL/min   GFR calc Af Amer >60 >60 mL/min    Comment: (NOTE) The eGFR has been calculated using the CKD EPI equation. This calculation has not been validated in all clinical situations. eGFR's persistently <60 mL/min signify possible Chronic Kidney Disease.    Anion gap 9 5 - 15    Comment: Performed at [facility] [address], [place], [place] [address]  Urinalysis, Complete w Microscopic     Status: Abnormal   Collection Time: 12/06/17 12:00 PM  Result Value Ref Range   Color, Urine STRAW (A) YELLOW   APPearance CLEAR (A) CLEAR   Specific Gravity, Urine 1.008 1.005 - 1.030   pH 7.0 5.0 - 8.0   Glucose, UA NEGATIVE NEGATIVE mg/dL   Hgb urine dipstick NEGATIVE NEGATIVE   Bilirubin Urine  NEGATIVE NEGATIVE   Ketones, ur NEGATIVE NEGATIVE mg/dL   Protein, ur NEGATIVE NEGATIVE mg/dL   Nitrite NEGATIVE NEGATIVE   Leukocytes, UA NEGATIVE NEGATIVE   RBC / HPF 0-5 0 - 5 RBC/hpf   WBC, UA 0-5 0 - 5 WBC/hpf   Bacteria, UA NONE SEEN NONE SEEN   Squamous Epithelial / LPF 0-5 (A) NONE SEEN   Mucus PRESENT     Comment: Performed at [facility], [address], [place], [place] [address]  Urine Culture     Status: Abnormal   Collection Time: 12/06/17 12:00 PM  Result Value Ref Range   Specimen Description      URINE, CLEAN CATCH Performed at [facility], [address], [place], [place] [address]    Special Requests      NONE Performed at [facility], [address] [place], [place] [address]    Culture (A)     <10,000 COLONIES/mL INSIGNIFICANT GROWTH Performed at [facility] [address],  [place], [place] [address]    Report Status 12/07/2017 FINAL   CBC     Status: Abnormal   Collection Time: 12/26/17 11:24 AM  Result Value Ref Range   WBC 6.2 3.8 - 10.6 K/uL   RBC 4.31 (L) 4.40 - 5.90 MIL/uL   Hemoglobin 13.8 13.0 - 18.0 g/dL   HCT 41.5 40.0 - 52.0 %   MCV 96.4 80.0 - 100.0 fL   MCH 32.1 26.0 - 34.0 pg   MCHC 33.3 32.0 - 36.0 g/dL   RDW 14.1 11.5 - 14.5 %   Platelets 176 150 - 440 K/uL    Comment: Performed at [facility], [address] [place], [place] [address]  Basic metabolic panel     Status: Abnormal   Collection Time: 01/15/18 11:39 AM  Result Value Ref Range   Sodium 140 135 - 145 mmol/L   Potassium 3.9 3.5 - 5.1 mmol/L   Chloride 97 (L) 101 - 111 mmol/L   CO2 36 (H) 22 - 32 mmol/L   Glucose, Bld 93 65 - 99 mg/dL   BUN 20 6 - 20 mg/dL   Creatinine, Ser 1.15 0.61 - 1.24 mg/dL   Calcium 8.8 (L) 8.9 - 10.3 mg/dL   GFR calc non Af Amer >60 >60 mL/min   GFR calc Af Amer >60 >60 mL/min    Comment: (NOTE) The eGFR has been calculated using the CKD EPI equation. This calculation has not been validated in all clinical situations. eGFR's persistently <60 mL/min signify possible Chronic Kidney Disease.    Anion gap 7 5 - 15    Comment: Performed at [facility], [address] [place], [place] [address]  Hepatic function panel     Status: Abnormal   Collection Time: 01/15/18 11:39 AM  Result Value Ref Range   Total Protein 6.4 (L) 6.5 - 8.1 g/dL   Albumin 3.7 3.5 - 5.0 g/dL   AST 17 15 - 41 U/L   ALT 12 (L) 17 - 63 U/L   Alkaline Phosphatase 52 38 - 126 U/L   Total Bilirubin 1.2 0.3 - 1.2 mg/dL   Bilirubin, Direct 0.2 0.1 - 0.5 mg/dL   Indirect Bilirubin 1.0 (H) 0.3 - 0.9 mg/dL    Comment: Performed at [facility], [address] [place], [place] [address]  CBC WITH DIFFERENTIAL     Status: Abnormal   Collection Time: 01/15/18 11:39 AM  Result Value Ref Range  WBC 14.5 (H) 3.8 - 10.6 K/uL   RBC 4.58 4.40 - 5.90 MIL/uL    Hemoglobin 14.6 13.0 - 18.0 g/dL   HCT 44.4 40.0 - 52.0 %   MCV 96.8 80.0 - 100.0 fL   MCH 31.8 26.0 - 34.0 pg   MCHC 32.9 32.0 - 36.0 g/dL   RDW 15.0 (H) 11.5 - 14.5 %   Platelets 173 150 - 440 K/uL   Neutrophils Relative % 84 %   Neutro Abs 12.1 (H) 1.4 - 6.5 K/uL   Lymphocytes Relative 8 %   Lymphs Abs 1.1 1.0 - 3.6 K/uL   Monocytes Relative 7 %   Monocytes Absolute 1.0 0.2 - 1.0 K/uL   Eosinophils Relative 1 %   Eosinophils Absolute 0.1 0 - 0.7 K/uL   Basophils Relative 0 %   Basophils Absolute 0.0 0 - 0.1 K/uL    Comment: Performed at [facility], [address] [place], [place] [address]  Brain natriuretic peptide     Status: Abnormal   Collection Time: 01/15/18 11:39 AM  Result Value Ref Range   B Natriuretic Peptide 113.0 (H) 0.0 - 100.0 pg/mL    Comment: Performed at [facility], [address] [place], [place] [address]  TSH     Status: None   Collection Time: 01/15/18 11:39 AM  Result Value Ref Range   TSH 0.701 0.350 - 4.500 uIU/mL    Comment: Performed by a 3rd Generation assay with a functional sensitivity of <=0.01 uIU/mL. Performed at [facility], [address] [place], [place] [address]   Culture, expectorated sputum-assessment     Status: None   Collection Time: 01/15/18 11:43 AM  Result Value Ref Range   Specimen Description SPUTUM    Special Requests Normal    Sputum evaluation      THIS SPECIMEN IS ACCEPTABLE FOR SPUTUM CULTURE Performed at [facility], [address], [place], [place] [address]    Report Status 01/15/2018 FINAL   Culture, respiratory (NON-Expectorated)     Status: None   Collection Time: 01/15/18 11:43 AM  Result Value Ref Range   Specimen Description      SPUTUM Performed at [facility], [address], [place], [place] [address]    Special Requests      Normal Reflexed from [id number] Performed at [facility], [place], [place] [address]     Gram Stain      ABUNDANT WBC PRESENT,BOTH PMN AND MONONUCLEAR RARE GRAM POSITIVE COCCI    Culture      FEW Consistent with normal respiratory flora. Performed at [facility], [place] [address], [place], [place] [address]    Report Status 01/18/2018 FINAL   Basic metabolic panel     Status: Abnormal   Collection Time: 01/16/18  5:33 AM  Result Value Ref Range   Sodium 140 135 - 145 mmol/L   Potassium 4.1 3.5 - 5.1 mmol/L   Chloride 98 (L) 101 - 111 mmol/L   CO2 37 (H) 22 - 32 mmol/L   Glucose, Bld 155 (H) 65 - 99 mg/dL   BUN 24 (H) 6 - 20 mg/dL   Creatinine, Ser 1.29 (H) 0.61 - 1.24 mg/dL   Calcium 8.9 8.9 - 10.3 mg/dL   GFR calc non Af Amer 57 (L) >60 mL/min   GFR calc Af Amer >60 >60 mL/min    Comment: (NOTE) The eGFR has been calculated using the CKD EPI equation. This  calculation has not been validated in all clinical situations. eGFR's persistently <60 mL/min signify possible Chronic Kidney Disease.    Anion gap 5 5 - 15    Comment: Performed at [facility], [address] [place], [place] [address]  CBC     Status: Abnormal   Collection Time: 01/16/18  5:33 AM  Result Value Ref Range   WBC 12.8 (H) 3.8 - 10.6 K/uL   RBC 4.39 (L) 4.40 - 5.90 MIL/uL   Hemoglobin 13.9 13.0 - 18.0 g/dL   HCT 42.7 40.0 - 52.0 %   MCV 97.2 80.0 - 100.0 fL   MCH 31.7 26.0 - 34.0 pg   MCHC 32.6 32.0 - 36.0 g/dL   RDW 15.2 (H) 11.5 - 14.5 %   Platelets 182 150 - 440 K/uL    Comment: Performed at [facility], [address] [place], [place] [address]  Basic metabolic panel     Status: Abnormal   Collection Time: 01/17/18  5:25 AM  Result Value Ref Range   Sodium 140 135 - 145 mmol/L   Potassium 4.1 3.5 - 5.1 mmol/L   Chloride 99 (L) 101 - 111 mmol/L   CO2 35 (H) 22 - 32 mmol/L   Glucose, Bld 128 (H) 65 - 99 mg/dL   BUN 35 (H) 6 - 20 mg/dL   Creatinine, Ser 1.06 0.61 - 1.24 mg/dL   Calcium 9.0 8.9 - 10.3 mg/dL   GFR calc non Af Amer >60 >60 mL/min   GFR  calc Af Amer >60 >60 mL/min    Comment: (NOTE) The eGFR has been calculated using the CKD EPI equation. This calculation has not been validated in all clinical situations. eGFR's persistently <60 mL/min signify possible Chronic Kidney Disease.    Anion gap 6 5 - 15    Comment: Performed at [facility], [place], [place] [address]  Procalcitonin - Baseline     Status: None   Collection Time: 01/17/18  5:25 AM  Result Value Ref Range   Procalcitonin <0.10 ng/mL    Comment:        Interpretation: PCT (Procalcitonin) <= 0.5 ng/mL: Systemic infection (sepsis) is not likely. Local bacterial infection is possible. (NOTE)       Sepsis PCT Algorithm           Lower Respiratory Tract                                      Infection PCT Algorithm    ----------------------------     ----------------------------         PCT < 0.25 ng/mL                PCT < 0.10 ng/mL         Strongly encourage             Strongly discourage   discontinuation of antibiotics    initiation of antibiotics    ----------------------------     -----------------------------       PCT 0.25 - 0.50 ng/mL            PCT 0.10 - 0.25 ng/mL               OR       >80% decrease in PCT            Discourage initiation of  antibiotics      Encourage discontinuation           of antibiotics    ----------------------------     -----------------------------         PCT >= 0.50 ng/mL              PCT 0.26 - 0.50 ng/mL               AND        <80% decrease in PCT             Encourage initiation of                                             antibiotics       Encourage continuation           of antibiotics    ----------------------------     -----------------------------        PCT >= 0.50 ng/mL                  PCT > 0.50 ng/mL               AND         increase in PCT                  Strongly encourage                                      initiation of  antibiotics    Strongly encourage escalation           of antibiotics                                     -----------------------------                                           PCT <= 0.25 ng/mL                                                 OR                                        > 80% decrease in PCT                                     Discontinue / Do not initiate                                             antibiotics Performed at [facility], [address], [place], [place] [address]   Basic metabolic panel     Status: Abnormal   Collection Time: 01/19/18  9:46 AM  Result Value Ref Range   Sodium 137 135 - 145 mmol/L   Potassium 4.9 3.5 - 5.1 mmol/L   Chloride 96 (L) 101 - 111 mmol/L   CO2 35 (H) 22 - 32 mmol/L   Glucose, Bld 159 (H) 65 - 99 mg/dL   BUN 34 (H) 6 - 20 mg/dL   Creatinine, Ser 1.16 0.61 - 1.24 mg/dL   Calcium 9.1 8.9 - 10.3 mg/dL   GFR calc non Af Amer >60 >60 mL/min   GFR calc Af Amer >60 >60 mL/min    Comment: (NOTE) The eGFR has been calculated using the CKD EPI equation. This calculation has not been validated in all clinical situations. eGFR's persistently <60 mL/min signify possible Chronic Kidney Disease.    Anion gap 6 5 - 15    Comment: Performed at [facility], [address] [place], [place] [address]  Magnesium     Status: None   Collection Time: 01/19/18  9:46 AM  Result Value Ref Range   Magnesium 2.4 1.7 - 2.4 mg/dL    Comment: Performed at [facility], [address] [place], [place] [address]  Basic metabolic panel     Status: Abnormal   Collection Time: 01/20/18  3:40 AM  Result Value Ref Range   Sodium 140 135 - 145 mmol/L   Potassium 4.3 3.5 - 5.1 mmol/L   Chloride 98 (L) 101 - 111 mmol/L   CO2 37 (H) 22 - 32 mmol/L   Glucose, Bld 99 65 - 99 mg/dL   BUN 34 (H) 6 - 20 mg/dL   Creatinine, Ser 1.05 0.61 - 1.24 mg/dL   Calcium 8.7 (L) 8.9 - 10.3 mg/dL   GFR calc non Af Amer >60 >60 mL/min    GFR calc Af Amer >60 >60 mL/min    Comment: (NOTE) The eGFR has been calculated using the CKD EPI equation. This calculation has not been validated in all clinical situations. eGFR's persistently <60 mL/min signify possible Chronic Kidney Disease.    Anion gap 5 5 - 15    Comment: Performed at [facility], [address] [place], [place] [address]  Magnesium     Status: None   Collection Time: 01/20/18  3:40 AM  Result Value Ref Range   Magnesium 2.3 1.7 - 2.4 mg/dL    Comment: Performed at [facility], [address] [place], [place] [address]  Basic metabolic panel     Status: Abnormal   Collection Time: 01/22/18  8:07 PM  Result Value Ref Range   Sodium 140 135 - 145 mmol/L   Potassium 4.6 3.5 - 5.1 mmol/L    Comment: HEMOLYSIS AT THIS LEVEL MAY AFFECT RESULT   Chloride 100 (L) 101 - 111 mmol/L   CO2 30 22 - 32 mmol/L   Glucose, Bld 179 (H) 65 - 99 mg/dL   BUN 45 (H) 6 - 20 mg/dL   Creatinine, Ser 1.33 (H) 0.61 - 1.24 mg/dL   Calcium 9.2 8.9 - 10.3 mg/dL   GFR calc non Af Amer 55 (L) >60 mL/min   GFR calc Af Amer >60 >60 mL/min    Comment: (NOTE) The eGFR has been calculated using the CKD EPI equation. This calculation has not been validated in all clinical situations. eGFR's persistently <60 mL/min signify possible Chronic Kidney Disease.    Anion gap 10 5 - 15    Comment: Performed at [facility], [address] [place], [place] [address]  CBC     Status: Abnormal   Collection Time: 01/22/18  8:07 PM  Result Value Ref Range   WBC 18.5 (H) 3.8 - 10.6 K/uL   RBC 5.23 4.40 - 5.90 MIL/uL   Hemoglobin 16.5 13.0 - 18.0 g/dL   HCT 50.6 40.0 - 52.0 %   MCV 96.7 80.0 - 100.0 fL   MCH 31.6 26.0 - 34.0 pg   MCHC 32.7 32.0 - 36.0 g/dL   RDW 15.5 (H) 11.5 - 14.5 %   Platelets 168 150 - 440 K/uL    Comment: Performed at [facility], [address] [place], [place] [address]  Troponin I     Status: Abnormal   Collection Time:  01/22/18  8:07 PM  Result Value Ref Range   Troponin I 0.03 (HH) <0.03 ng/mL    Comment: CRITICAL RESULT CALLED TO, READ BACK BY AND VERIFIED WITH REBECCA UHORCHUK ON 01/22/18 AT 2038 JAG Performed at [facility], [address] [place], [place] [address]   Blood culture (routine x 2)     Status: None   Collection Time: 01/22/18 11:56 PM  Result Value Ref Range   Specimen Description BLOOD RIGHT AC    Special Requests      BOTTLES DRAWN AEROBIC AND ANAEROBIC Blood Culture adequate volume   Culture      NO GROWTH 5 DAYS Performed at [facility], [address], [place], [place] [address]    Report Status 01/28/2018 FINAL   Blood culture (routine x 2)     Status: None   Collection Time: 01/22/18 11:56 PM  Result Value Ref Range   Specimen Description BLOOD RIGHT HAND    Special Requests      BOTTLES DRAWN AEROBIC AND ANAEROBIC Blood Culture adequate volume   Culture      NO GROWTH 5 DAYS Performed at [facility], [address] [place], [place] [address]    Report Status 01/28/2018 FINAL   Culture, sputum-assessment     Status: None   Collection Time: 01/23/18  1:42 AM  Result Value Ref Range   Specimen Description EXPECTORATED SPUTUM    Special Requests NONE    Sputum evaluation      THIS SPECIMEN IS ACCEPTABLE FOR SPUTUM CULTURE Performed at [facility], [address], [place], [place] [address]    Report Status 01/23/2018 FINAL   Culture, respiratory (NON-Expectorated)     Status: None   Collection Time: 01/23/18  1:42 AM  Result Value Ref Range   Specimen Description      EXPECTORATED SPUTUM Performed at [facility], [address] [place], [place] [address]    Special Requests      NONE Reflexed from [id number] Performed at [facility], [address] [place], [place] [address]    Gram Stain      RARE WBC PRESENT, PREDOMINANTLY PMN FEW GRAM POSITIVE COCCI IN CLUSTERS FEW GRAM POSITIVE RODS RARE  GRAM NEGATIVE RODS    Culture      Consistent with normal respiratory flora. Performed at [facility], [place] [address], [place], [place] [address]    Report Status 01/25/2018 FINAL   Strep pneumoniae urinary antigen     Status: None   Collection Time: 01/23/18  5:18 AM  Result Value Ref Range   Strep Pneumo Urinary Antigen NEGATIVE NEGATIVE    Comment:        Infection due to S. pneumoniae cannot be absolutely ruled out  since the antigen present may be below the detection limit of the test. Performed at [facility], [place] [address], [place], [place] [address]   HIV antibody (Routine Screening)     Status: None   Collection Time: 01/23/18  6:07 AM  Result Value Ref Range   HIV Screen 4th Generation wRfx Non Reactive Non Reactive    Comment: (NOTE) Performed At: [facility] [address] [place], [place] [id number] Rush Farmer MD [id number] Performed at [facility], [address] [place], [place] [address]   Troponin I     Status: None   Collection Time: 01/23/18  6:07 AM  Result Value Ref Range   Troponin I <0.03 <0.03 ng/mL    Comment: Performed at [facility], [address] [place], [place] [address]  MRSA PCR Screening     Status: None   Collection Time: 01/23/18  2:52 PM  Result Value Ref Range   MRSA by PCR NEGATIVE NEGATIVE    Comment:        The GeneXpert MRSA Assay (FDA approved for NASAL specimens only), is one component of a comprehensive MRSA colonization surveillance program. It is not intended to diagnose MRSA infection nor to guide or monitor treatment for MRSA infections. Performed at [facility], [address] [place], [place] [address]   Procalcitonin - Baseline     Status: None   Collection Time: 01/23/18  3:17 PM  Result Value Ref Range   Procalcitonin 0.12 ng/mL    Comment:        Interpretation: PCT (Procalcitonin) <= 0.5 ng/mL: Systemic infection (sepsis) is not  likely. Local bacterial infection is possible. (NOTE)       Sepsis PCT Algorithm           Lower Respiratory Tract                                      Infection PCT Algorithm    ----------------------------     ----------------------------         PCT < 0.25 ng/mL                PCT < 0.10 ng/mL         Strongly encourage             Strongly discourage   discontinuation of antibiotics    initiation of antibiotics    ----------------------------     -----------------------------       PCT 0.25 - 0.50 ng/mL            PCT 0.10 - 0.25 ng/mL               OR       >80% decrease in PCT            Discourage initiation of                                            antibiotics      Encourage discontinuation           of antibiotics    ----------------------------     -----------------------------         PCT >= 0.50 ng/mL              PCT 0.26 -  0.50 ng/mL               AND        <80% decrease in PCT             Encourage initiation of                                             antibiotics       Encourage continuation           of antibiotics    ----------------------------     -----------------------------        PCT >= 0.50 ng/mL                  PCT > 0.50 ng/mL               AND         increase in PCT                  Strongly encourage                                      initiation of antibiotics    Strongly encourage escalation           of antibiotics                                     -----------------------------                                           PCT <= 0.25 ng/mL                                                 OR                                        > 80% decrease in PCT                                     Discontinue / Do not initiate                                             antibiotics Performed at [facility], [address] [place], [place] [address]   Procalcitonin     Status: None   Collection Time: 01/24/18  6:12 AM  Result Value Ref  Range   Procalcitonin <0.10 ng/mL    Comment:        Interpretation: PCT (Procalcitonin) <= 0.5 ng/mL: Systemic infection (sepsis) is not likely. Local bacterial infection is possible. (NOTE)       Sepsis PCT Algorithm  Lower Respiratory Tract                                      Infection PCT Algorithm    ----------------------------     ----------------------------         PCT < 0.25 ng/mL                PCT < 0.10 ng/mL         Strongly encourage             Strongly discourage   discontinuation of antibiotics    initiation of antibiotics    ----------------------------     -----------------------------       PCT 0.25 - 0.50 ng/mL            PCT 0.10 - 0.25 ng/mL               OR       >80% decrease in PCT            Discourage initiation of                                            antibiotics      Encourage discontinuation           of antibiotics    ----------------------------     -----------------------------         PCT >= 0.50 ng/mL              PCT 0.26 - 0.50 ng/mL               AND        <80% decrease in PCT             Encourage initiation of                                             antibiotics       Encourage continuation           of antibiotics    ----------------------------     -----------------------------        PCT >= 0.50 ng/mL                  PCT > 0.50 ng/mL               AND         increase in PCT                  Strongly encourage                                      initiation of antibiotics    Strongly encourage escalation           of antibiotics                                     -----------------------------  PCT <= 0.25 ng/mL                                                 OR                                        > 80% decrease in PCT                                     Discontinue / Do not initiate                                             antibiotics Performed at [facility], [address] [place], [place] [address]   Glucose, capillary     Status: Abnormal   Collection Time: 01/24/18  6:08 PM  Result Value Ref Range   Glucose-Capillary 116 (H) 65 - 99 mg/dL  Glucose, capillary     Status: Abnormal   Collection Time: 01/24/18  8:55 PM  Result Value Ref Range   Glucose-Capillary 140 (H) 65 - 99 mg/dL   Comment 1 Notify RN    Comment 2 Document in Chart   Procalcitonin     Status: None   Collection Time: 01/25/18  5:40 AM  Result Value Ref Range   Procalcitonin <0.10 ng/mL    Comment:        Interpretation: PCT (Procalcitonin) <= 0.5 ng/mL: Systemic infection (sepsis) is not likely. Local bacterial infection is possible. (NOTE)       Sepsis PCT Algorithm           Lower Respiratory Tract                                      Infection PCT Algorithm    ----------------------------     ----------------------------         PCT < 0.25 ng/mL                PCT < 0.10 ng/mL         Strongly encourage             Strongly discourage   discontinuation of antibiotics    initiation of antibiotics    ----------------------------     -----------------------------       PCT 0.25 - 0.50 ng/mL            PCT 0.10 - 0.25 ng/mL               OR       >80% decrease in PCT            Discourage initiation of                                            antibiotics      Encourage discontinuation  of antibiotics    ----------------------------     -----------------------------         PCT >= 0.50 ng/mL              PCT 0.26 - 0.50 ng/mL               AND        <80% decrease in PCT             Encourage initiation of                                             antibiotics       Encourage continuation           of antibiotics    ----------------------------     -----------------------------        PCT >= 0.50 ng/mL                  PCT > 0.50 ng/mL               AND         increase in PCT                  Strongly encourage                                       initiation of antibiotics    Strongly encourage escalation           of antibiotics                                     -----------------------------                                           PCT <= 0.25 ng/mL                                                 OR                                        > 80% decrease in PCT                                     Discontinue / Do not initiate                                             antibiotics Performed at [facility], [address] [place], [place] [address]   CBC     Status: Abnormal   Collection Time: 01/25/18  5:40 AM  Result Value Ref Range   WBC 14.3 (H) 3.8 - 10.6 K/uL   RBC  4.47 4.40 - 5.90 MIL/uL   Hemoglobin 14.2 13.0 - 18.0 g/dL   HCT 43.0 40.0 - 52.0 %   MCV 96.2 80.0 - 100.0 fL   MCH 31.6 26.0 - 34.0 pg   MCHC 32.9 32.0 - 36.0 g/dL   RDW 14.6 (H) 11.5 - 14.5 %   Platelets 154 150 - 440 K/uL    Comment: Performed at [facility], [address] [place], [place] [address]  Basic metabolic panel     Status: Abnormal   Collection Time: 01/25/18  5:40 AM  Result Value Ref Range   Sodium 139 135 - 145 mmol/L   Potassium 5.0 3.5 - 5.1 mmol/L   Chloride 100 (L) 101 - 111 mmol/L   CO2 34 (H) 22 - 32 mmol/L   Glucose, Bld 116 (H) 65 - 99 mg/dL   BUN 39 (H) 6 - 20 mg/dL   Creatinine, Ser 1.03 0.61 - 1.24 mg/dL   Calcium 9.0 8.9 - 10.3 mg/dL   GFR calc non Af Amer >60 >60 mL/min   GFR calc Af Amer >60 >60 mL/min    Comment: (NOTE) The eGFR has been calculated using the CKD EPI equation. This calculation has not been validated in all clinical situations. eGFR's persistently <60 mL/min signify possible Chronic Kidney Disease.    Anion gap 5 5 - 15    Comment: Performed at [facility], [place], [place] [address]  Glucose, capillary     Status: Abnormal   Collection Time: 01/25/18  7:58 AM  Result Value Ref Range   Glucose-Capillary 126 (H) 65 - 99 mg/dL   Glucose, capillary     Status: None   Collection Time: 01/25/18 11:32 AM  Result Value Ref Range   Glucose-Capillary 82 65 - 99 mg/dL  Glucose, capillary     Status: Abnormal   Collection Time: 01/25/18  4:56 PM  Result Value Ref Range   Glucose-Capillary 109 (H) 65 - 99 mg/dL  Glucose, capillary     Status: Abnormal   Collection Time: 01/25/18  8:56 PM  Result Value Ref Range   Glucose-Capillary 144 (H) 65 - 99 mg/dL   Comment 1 Notify RN    Comment 2 Document in Chart   Glucose, capillary     Status: Abnormal   Collection Time: 01/26/18  8:20 AM  Result Value Ref Range   Glucose-Capillary 164 (H) 65 - 99 mg/dL  Glucose, capillary     Status: None   Collection Time: 01/26/18  1:07 PM  Result Value Ref Range   Glucose-Capillary 89 65 - 99 mg/dL  Glucose, capillary     Status: Abnormal   Collection Time: 01/26/18  5:06 PM  Result Value Ref Range   Glucose-Capillary 142 (H) 65 - 99 mg/dL  Glucose, capillary     Status: Abnormal   Collection Time: 01/26/18  9:12 PM  Result Value Ref Range   Glucose-Capillary 103 (H) 65 - 99 mg/dL  Glucose, capillary     Status: Abnormal   Collection Time: 01/27/18  7:52 AM  Result Value Ref Range   Glucose-Capillary 113 (H) 65 - 99 mg/dL  Glucose, capillary     Status: None   Collection Time: 01/27/18 12:05 PM  Result Value Ref Range   Glucose-Capillary 94 65 - 99 mg/dL  Glucose, capillary     Status: Abnormal   Collection Time: 01/27/18  4:39 PM  Result Value Ref Range   Glucose-Capillary 113 (H) 65 - 99  mg/dL  Glucose, capillary     Status: Abnormal   Collection Time: 01/27/18  9:06 PM  Result Value Ref Range   Glucose-Capillary 134 (H) 65 - 99 mg/dL  Glucose, capillary     Status: Abnormal   Collection Time: 01/28/18  7:37 AM  Result Value Ref Range   Glucose-Capillary 131 (H) 65 - 99 mg/dL  Glucose, capillary     Status: None   Collection Time: 01/28/18 11:20 AM  Result Value Ref Range   Glucose-Capillary 80 65 - 99 mg/dL   Glucose, capillary     Status: Abnormal   Collection Time: 01/28/18  4:30 PM  Result Value Ref Range   Glucose-Capillary 115 (H) 65 - 99 mg/dL   Objective  Body mass index is 30.07 kg/m. Wt Readings from Last 3 Encounters:  02/26/18 215 lb 9.6 oz (97.8 kg)  02/04/18 211 lb (95.7 kg)  01/25/18 208 lb 11.2 oz (94.7 kg)   Temp Readings from Last 3 Encounters:  02/26/18 98.8 F (37.1 C) (Oral)  01/28/18 98.1 F (36.7 C) (Oral)  01/21/18 98.2 F (36.8 C) (Oral)   BP Readings from Last 3 Encounters:  02/26/18 132/68  02/04/18 132/78  01/28/18 121/82   Pulse Readings from Last 3 Encounters:  02/26/18 (!) 125  02/04/18 (!) 127  01/28/18 (!) 112    Physical Exam  Constitutional: He is oriented to person, place, and time. He appears well-developed and well-nourished. He is cooperative.  HENT:  Head: Normocephalic and atraumatic.  Mouth/Throat: Oropharynx is clear and moist and mucous membranes are normal.  Eyes: Pupils are equal, round, and reactive to light. Conjunctivae are normal.  Cardiovascular: Regular rhythm and normal heart sounds. Tachycardia present.  2+ leg edema b/l   Pulmonary/Chest: Effort normal.  Course breath sounds b/l on 2L o2 cont.   Neurological: He is alert and oriented to person, place, and time. Gait normal.  Skin: Skin is warm, dry and intact.  Dry skin per pt he has fear of water so does not bath much    Psychiatric: He has a normal mood and affect. His speech is normal and behavior is normal. Judgment and thought content normal. Cognition and memory are normal.  Nursing note and vitals reviewed.   Assessment   1. Anxiety  2. Severe COPD on 2L O2 cont/pnuemonia had 01/22/18 RLL 3. Leg edema  4.sinus tachycardia ? Related to #1, #2 vs other  5. HTN controlled  6. HM Plan   1. Refilled xanax 1 mg tid prn rec not long term will need to control anxiety in future other means vs seek mental health lexapro 20 mg qd  2. CT chest will order  Cc Dr.  [name] due to f/u 03/18/18  3. Inc lasix 40 mg qd  Stat US legs b/l  4. F/u in 2 weeks  Pt to sch fasting labs  5. Cont meds  6.  Had flu shot 07/03/17  Tdap, prevnar check prior PCP records  pna 23 had 01/16/18  Consider shingrix in future   Check fasting labs  Get records PCP Dr. Clayborn Bigness including stool tests had in the past never had colonoscopy  Smoker age 37 to 1 max 2 ppd quit 12/2017 no FH    Provider: Dr. Olivia Mackie McLean-Scocuzza-Internal Medicine

## 2018-02-26 NOTE — Patient Outreach (Signed)
[name] ([facility] Care Management  02/26/2018  Hershal CoriaDennis R Boyde October 04, 1955 [id number]  [facility] to reach [name] by phone today when I reached out.   Plan: I will attempt to contact Mr. Andrzejewski again tomorrow.    Marja Kayslisa Gilboy MHA,BSN,RN,CCM [facility] Care Management  [phone]

## 2018-02-26 NOTE — Telephone Encounter (Signed)
[facility]  b/l +DVT and his HR was 125 today in clinic we need to r/o PE as well   TMS

## 2018-02-27 ENCOUNTER — Other Ambulatory Visit: Payer: Self-pay

## 2018-02-27 ENCOUNTER — Telehealth: Payer: Self-pay | Admitting: Pulmonary Disease

## 2018-02-27 ENCOUNTER — Other Ambulatory Visit: Payer: Self-pay | Admitting: *Deleted

## 2018-02-27 ENCOUNTER — Telehealth: Payer: Self-pay | Admitting: Internal Medicine

## 2018-02-27 DIAGNOSIS — G629 Polyneuropathy, unspecified: Secondary | ICD-10-CM | POA: Diagnosis not present

## 2018-02-27 DIAGNOSIS — K219 Gastro-esophageal reflux disease without esophagitis: Secondary | ICD-10-CM | POA: Diagnosis not present

## 2018-02-27 DIAGNOSIS — I110 Hypertensive heart disease with heart failure: Secondary | ICD-10-CM | POA: Diagnosis not present

## 2018-02-27 DIAGNOSIS — I5033 Acute on chronic diastolic (congestive) heart failure: Secondary | ICD-10-CM | POA: Diagnosis not present

## 2018-02-27 DIAGNOSIS — Z7952 Long term (current) use of systemic steroids: Secondary | ICD-10-CM | POA: Diagnosis not present

## 2018-02-27 DIAGNOSIS — J45909 Unspecified asthma, uncomplicated: Secondary | ICD-10-CM | POA: Diagnosis not present

## 2018-02-27 DIAGNOSIS — J441 Chronic obstructive pulmonary disease with (acute) exacerbation: Secondary | ICD-10-CM | POA: Diagnosis not present

## 2018-02-27 DIAGNOSIS — Z9981 Dependence on supplemental oxygen: Secondary | ICD-10-CM | POA: Diagnosis not present

## 2018-02-27 DIAGNOSIS — J9611 Chronic respiratory failure with hypoxia: Secondary | ICD-10-CM | POA: Diagnosis not present

## 2018-02-27 DIAGNOSIS — Z7951 Long term (current) use of inhaled steroids: Secondary | ICD-10-CM | POA: Diagnosis not present

## 2018-02-27 NOTE — Patient Outreach (Addendum)
[facility]) Care Management  [place]   02/27/2018  ADHVIK CANADY 27-Feb-1956 [id number]  37year oldmalereferred to [place] Management.[place] services requested for medication assistance for his inhalers. PMHx includes, but not limited to, heart failure, pulmonary hypertension, hypertension and COPD.  Successful outreach attempt to Mr. Geraghty. HIPAA identifiers verified.  Subjective: Mr. Cihlar reports that his legs had been swelling and not going down as in the past.  Scans done yesterday found that he had bilateral DVT and small PE.  He reports that he was started on Xarelto, but is not sure he can afford the $45.00 co-pay/month.  Objective:  SCr 1.22 mg/dL on 6/11, up slightly from 1.03 mg/dL on 01/25/18   Current Medications: Current Outpatient Medications  Medication Sig Dispense Refill  . ALPRAZolam (XANAX) 1 MG tablet Take 1 tablet (1 mg total) by mouth 3 (three) times daily as needed for anxiety. 90 tablet 2  . azithromycin (ZITHROMAX Z-PAK) 250 MG tablet Take 2 tablets (500 mg) on  Day 1,  followed by 1 tablet (250 mg) once daily on Days 2 through 5. 6 each 0  . Baclofen 5 MG TABS Take 1 tablet by mouth 2 (two) times daily as needed (muscle spasms).    . Cholecalciferol (VITAMIN D) 2000 units tablet Take 2,000 Units by mouth daily.    Marland Kitchen diltiazem (CARDIZEM) 30 MG tablet Take 30 mg by mouth daily.    Marland Kitchen escitalopram (LEXAPRO) 20 MG tablet Take 20 mg by mouth at bedtime.  0  . furosemide (LASIX) 40 MG tablet Take 1 tablet (40 mg total) by mouth daily. In am 90 tablet 0  . gabapentin (NEURONTIN) 300 MG capsule TAKE 1 TO 2 CAPSULES BY MOUTH THREE TIMES DAILY 270 capsule 0  . magnesium oxide (MAG-OX) 400 MG tablet Take 400 mg by mouth daily.    . mometasone-formoterol (DULERA) 200-5 MCG/ACT AERO Inhale 2 puffs into the lungs 2 (two) times daily. 1 Inhaler 0  . potassium chloride (K-DUR,KLOR-CON) 10 MEQ tablet Take 20 mEq by mouth daily.  Repeat if second LASIX taken    . predniSONE (DELTASONE) 50 MG tablet Take 1 tablet (50 mg total) by mouth daily for 4 days. 4 tablet 0  . Probiotic Product (PROBIOTIC PO) Take by mouth.    . Rivaroxaban 15 & 20 MG TBPK Take as directed on package: Start with one 3m tablet by mouth twice a day with food. On Day 22, switch to one 220mtablet once a day with food. 51 each 0  . rOPINIRole (REQUIP) 0.25 MG tablet TAKE 1 TABLET BY MOUTH TWICE DAILY FOR CRAMPS 180 tablet 0  . Spacer/Aero-Holding Chambers (OPTICHAMBER ADVANTAGE-LG MASK) MISC Use as directed with inhaler diag  j44.1 2 each 1  . theophylline (UNIPHYL) 400 MG 24 hr tablet Take 400 mg by mouth every morning.     . Tiotropium Bromide Monohydrate (SPIRIVA RESPIMAT) 2.5 MCG/ACT AERS Inhale into the lungs.     No current facility-administered medications for this visit.     Functional Status: In your present state of health, do you have any difficulty performing the following activities: 01/23/2018 01/15/2018  Hearing? N N  Vision? N N  Difficulty concentrating or making decisions? N N  Walking or climbing stairs? Y Y  Comment - -  Dressing or bathing? N N  Doing errands, shopping? N Y  PrConservation officer, naturend eating ? - -  Using the Toilet? - -  In the past six  months, have you accidently leaked urine? - -  Do you have problems with loss of bowel control? - -  Managing your Medications? - -  Managing your Finances? - -  Housekeeping or managing your Housekeeping? - -  Some recent data might be hidden    Fall/Depression Screening: Fall Risk  12/19/2017 10/11/2017 10/08/2017  Falls in the past year? No No -  Number falls in past yr: - - 1  Injury with Fall? - - No  Risk for fall due to : - - -  Risk for fall due to: Comment - - -  Follow up - - Falls prevention discussed   PHQ 2/9 Scores 12/19/2017 10/11/2017 10/05/2017 09/26/2017 04/12/2016 03/29/2016 03/02/2016  PHQ - 2 Score 0 _0 PHQ- 9 Score - _1 ASSESSMENT: Date  Discharged from Hospital: 02/26/18 Date Medication Reconciliation Performed: 02/27/2018  New Medications at Discharge:  rivaroxaban  alprazolam  azithromcyin  prednisone  baclofen  Patient was recently discharged from hospital and all medications have been reviewed  Drugs sorted by system:  Neurologic/Psychologic: alprazolam, escitalopram, gabapentin  Cardiovascular: diltiazem, furosemide, potassium chloride, rivaroxaban, ropinirole,   Pulmonary/Allergy: prednisone, mometasone/formoterol, theophylline, tiotropium   Gastrointestinal: probiotic  Vitamins/Minerals: cholecalciferol, magnesium oxide,   Infectious Diseases: azithromycin,  Miscellaneous: baclofen   Drug interactions:  Plasma concentrations and pharmacologic effects of rivaroxaban may be increased by diltiazem.  Medication Assistance: Xarelto (rivaroxaban) is made by [employer] and they require a 4% out of pocket (OOP) expenditure to apply to their patient assistance program.  Eliquis (apixaban) made by [place] requires 3% out of pocket expenditure.  Mr. Nazareno states he does not know his out of pocket this year, but he is not in the donut hole, so he has probably not met these requirements.  Requested that he call his insurance company and request his out of pocket amount spent this year.  He is approved for 50% Partial Extra Help LIS.  Informed patient that he should discuss warfarin at his PCP's visit tomorrow, as this may be his only affordable option at this time.  Patient states he will call me back Friday to inform me of his OOP for this year and the anticoagulation discussion after his PCP visit.  Plan: Follow up with patient on Friday.  Joetta Manners, PharmD Clinical Pharmacist [place] [phone]  Addendum: Per [employer], Mr. Linhardt TROOP for this year is $213.54.  Will discuss how far he is from meeting eligibility requirements for patient assistance  tomorrow.  Joetta Manners, PharmD Clinical Pharmacist [place] [phone]

## 2018-02-27 NOTE — Telephone Encounter (Signed)
Pt states he has blood clots in his legs and possibly one in his lungs. States his PCP sent him to the ED yesterday, where a chest xray and CT was performed. States the Dr. Catalina Pizzaold him he may "have a small clot in his lung" please call to discuss.

## 2018-02-27 NOTE — Patient Outreach (Signed)
[facility]) Care Management  02/27/2018  Dustin [name] 1956-09-14 [id number]  Transition of Care Outreach Call #3:  Dustin Washam Winburnis an 62 y.o.gentleman with past medical history which includes COPD (Gold IV Classificaton), asthma, hypertension, anxiety and depression, and GERD who has had4inpatient hospital admissions in the last 6 months, all for respiratory ailments:   09/18/17-09/24/17: COPD exacerbation with HCAP 11/22/17-11/29/17: COPD exacerbation/respiratory failure with intubation (Pulmonolgy f/u appointments 12/18/17 & 01/15/18) 01/15/18- 01/21/18:COPD exacerbationand acute/chronic CHF 5/719-01/28/18: COPD exacerbation 02/26/18 - ED visit from PCP office (DVT)   COPD Management- Dustin [name] presented to the ED from his PCP office on 02/26/18 because of concern about lower extremity swelling. He was seen in the ED and a venous doppler was performed showing a DVT in the popliteal vein and a CT Angio Chest which showed "suspicion of subsegmental pulmonary emboli lingula and LLL". He was evaluated in the ED, prescribed Xarelto, and discharged to home.   I called Dustin [name] today and he reports feeling "about the same". He says he is not more or less short of breath than usual. He is taking all medications as prescribed. Dustin [name] called his PCP and his pulmonologist today about the cost of his prescribed Xarelto ($900 without insurance and $45 copay with insurance). He says this extra $45 if he needs to pay for this monthly will add a considerable strain to him financially. He says he already has difficulty paying for his inhalers. Dustin [name] picked up his Xarelto this morning and is taking it as prescribed. Our pharmacy team has been following Dustin [name] and I have updated them of Dustin [name] ED visit and his medication management/assistance needs.   Level of Care/Living Arrangements - Dustin [name] picked up his key to his new apartment in [place] yesterday,  before he attended his new patient appointment. He says he can move in anytime but is waiting until after his follow up appointment with his new PCP tomorrow. He does plan to move into the apartment but has some concerns because he will be living alone. I noted his new address in the medical record as a "temporary address" and asked him to let us know when he moves.   Home Health Follow up - Dustin [name] anticipates a visit from his home health nurse today. He is to see his PCP tomorrow. He has an appointment with his pulmonologist, Dr. [name] on 03/18/18. I advised Dustin [name] to inquire with his [facility] as to when she was to visit next and how many times weekly she is planning to visit. If she is not seeing him at least twice weekly, I will see him at home next week.   Plan: Telephone follow up with Dustin [name] on Friday to follow up pulmonary care needs.   [facility] CM Care Plan Problem One     Most Recent Value  Care Plan Problem One  Knowledge Deficits related to changes in pulmonary plan of care  Role Documenting the Problem One  Care Management Coordinator  Care Plan for Problem One  Active  [facility] Long Term Goal   Over the next 60 days, patient will verbalize and demonstrate knowledge of changes to plan of care for management of pulmonary disease as outlined by new pulmonnary provider  [facility] Long Term Goal Start Date  02/27/18  Interventions for Problem One Long Term Goal  reviewed updated changes in plan of care as outlined by provider team  [facility] CM Short Term Goal #1  Over the next 30 days, patient will report new or worsened symptoms to pulmonary or prmary care or 911 as appropriate  [facility] CM Short Term Goal #1 Start Date  01/22/18  [facility] CM Short Term Goal #1 Met Date  02/27/18  Interventions for Short Term Goal #1  reviewed signs and symptoms for which patient should notify provider  [facility] CM Short Term Goal #2   Over the next 30 days, patient will take all medications including inhalers as prescribed    [facility] CM Short Term Goal #2 Start Date  12/19/17  [facility] CM Short Term Goal #2 Met Date  02/27/18  Interventions for Short Term Goal #2  reviewed medications including additions  [facility] CM Short Term Goal #3  Over the next 7 days, patient will work with pharmacy team to ascertaiin medication assistance options  [facility] CM Short Term Goal #3 Start Date  02/27/18  Interventions for Short Tern Goal #3  collaboration with pharmacy team and PCP/Pulmonary office  [facility] CM Short Term Goal #4  Over the next 30 days, patient will attend all scheduled provider appointments  [facility] CM Short Term Goal #4 Start Date  02/27/18  Interventions for Short Term Goal #4  reviewed provider appointment schedule      [facility] Care Management  [phone]

## 2018-02-27 NOTE — Telephone Encounter (Signed)
Please advise 

## 2018-02-27 NOTE — Telephone Encounter (Signed)
Copied from CRM [id number]. Topic: Inquiry >> Feb 27, 2018 12:30 PM Yvonna Alanisobinson, Andra M wrote: Reason for CRM: Patient called to let Dr. French Anaracy know that he cannot afford the blood thinner Xarelto. He needs an alternative to this medication that is cheaper. He is very concerned about his blood clots. Patient is willing to be admitted to the hospital if this would be cheaper to obtain the medication as an inpatient. Please call patient back asap at [phone].        Thank You!!!

## 2018-02-27 NOTE — Telephone Encounter (Signed)
If he is unable to afford this medication Xarelto and is not taking blood thinners  the best option would be to go back to the hospital for admission and let the hospital social worker figure out a plan so he can be on blood thinners he can afford  He needs to take blood thinners he has clots in his legs and lungs suspected   Mal AmabileBrock, Did he get the medication?    Dr. Sung AmabileSimonds and or Rayfield Citizenaroline what do you suggest?

## 2018-02-27 NOTE — Telephone Encounter (Signed)
Please review ED visit 02/26/18. Pt states he had blood clot in leg and lungs. Called patient to make sure he has picked up Xarelto. Pt has not started therapy and is not sure he can afford. Advised patient to find out cost because this is an important medication for treatment of embolism. He has appt with pcp on tomorrow and f/u scheduled here on 03/18/18.

## 2018-02-28 ENCOUNTER — Encounter: Payer: Self-pay | Admitting: Internal Medicine

## 2018-02-28 ENCOUNTER — Other Ambulatory Visit: Payer: Medicare Other

## 2018-02-28 ENCOUNTER — Ambulatory Visit (INDEPENDENT_AMBULATORY_CARE_PROVIDER_SITE_OTHER): Payer: Medicare Other | Admitting: Internal Medicine

## 2018-02-28 VITALS — BP 112/64 | HR 100 | Temp 98.1°F | Ht 70.0 in | Wt 215.0 lb

## 2018-02-28 DIAGNOSIS — J690 Pneumonitis due to inhalation of food and vomit: Secondary | ICD-10-CM | POA: Diagnosis not present

## 2018-02-28 DIAGNOSIS — I272 Pulmonary hypertension, unspecified: Secondary | ICD-10-CM

## 2018-02-28 DIAGNOSIS — J9621 Acute and chronic respiratory failure with hypoxia: Secondary | ICD-10-CM | POA: Diagnosis not present

## 2018-02-28 DIAGNOSIS — I2699 Other pulmonary embolism without acute cor pulmonale: Secondary | ICD-10-CM

## 2018-02-28 DIAGNOSIS — J432 Centrilobular emphysema: Secondary | ICD-10-CM

## 2018-02-28 DIAGNOSIS — I82403 Acute embolism and thrombosis of unspecified deep veins of lower extremity, bilateral: Secondary | ICD-10-CM | POA: Diagnosis not present

## 2018-02-28 MED ORDER — AMOXICILLIN-POT CLAVULANATE 875-125 MG PO TABS: 1.0000 | ORAL_TABLET | Freq: Two times a day (BID) | ORAL | 0 refills | Status: DC

## 2018-02-28 NOTE — Patient Instructions (Addendum)
Stop Zpack  Start Augmentin 1 pill 2x per day with food x 1 week Activate Xarelto card  15 mg 2x per day x 21 days then day 22 you start 20 mg 1x per day with food    Pulmonary Embolism A pulmonary embolism (PE) is a sudden blockage or decrease of blood flow in one lung or both lungs. Most blockages come from a blood clot that forms in a lower leg, thigh, or arm vein (deep vein thrombosis, DVT) and travels to the lungs. A clot is blood that has thickened into a gel or solid. PE is a dangerous and life-threatening condition that needs to be treated right away. What are the causes? This condition is usually caused by a blood clot that forms in a vein and moves to the lungs. In rare cases, it may be caused by air, fat, part of a tumor, or other tissue that moves through the veins and into the lungs. What increases the risk? The following factors may make you more likely to develop this condition:  Having DVT or a history of DVT.  Being older than age 66.  Personal or family history of blood clots or blood clotting disease.  Major or lengthy surgery.  Orthopedic surgery, especially hip or knee replacement.  Traumatic injury, such as breaking a hip or leg.  Spinal cord injury.  Stroke.  Taking medicines that contain estrogen. These include birth control pills and hormone replacement therapy.  Long-term (chronic) lung or heart disease.  Cancer and chemotherapy.  Having a central venous catheter.  Pregnancy and the period after delivery.  What are the signs or symptoms? Symptoms of this condition usually start suddenly and include:  Shortness of breath while active or at rest.  Coughing or coughing up blood or blood-tinged mucus.  Chest pain that is often worse with deep breaths.  Rapid or irregular heartbeat.  Feeling light-headed or dizzy.  Fainting.  Feeling anxious.  Sweating.  Pain and swelling in a leg. This is a symptom of DVT, which can lead to PE.  How is  this diagnosed? This condition may be diagnosed based on:  Your medical history.  A physical exam.  Blood tests to check blood oxygen level and how well your blood clots, and a D-dimer blood test, which checks your blood for a substance that is released when a blood clot breaks apart.  CT pulmonary angiogram. This test checks blood flow in and around your lungs.  Ventilation-perfusion scan, also called a lung VQ scan. This test measures air flow and blood flow to the lungs.  Ultrasound of the legs to look for blood clots.  How is this treated? Treatment for this conditions depends on many factors, such as the cause of your PE, your risk for bleeding or developing more clots, and other medical conditions you have. Treatment aims to remove, dissolve, or stop blood clots from forming or growing larger. Treatment may include:  Blood thinning medicines (anticoagulants) to stop clots from forming or growing. These medicines may be given as a pill, as an injection, or through an IV tube (infusion).  Medicines that dissolve clots (thrombolytics).  A procedure in which a flexible tube is used to remove a blood clot (embolectomy) or deliver medicine to destroy it (catheter-directed thrombolysis).  A procedure in which a filter is inserted into a large vein that carries blood to the heart (inferior vena cava). This filter (vena cava filter) catches blood clots before they reach the lungs.  Surgery  to remove the clot (surgical embolectomy). This is rare.  You may need a combination of immediate, long-term (up to 3 months after diagnosis), and extended (more than 3 months after diagnosis) treatments. Your treatment may continue for several months (maintenance therapy). You and your health care provider will work together to choose the treatment program that is best for you. Follow these instructions at home: If you are taking an anticoagulant medicine:  Take the medicine every day at the same time  each day.  Understand what foods and drugs interact with your medicine.  Understand the side effects of this medicine, including excessive bruising or bleeding. Ask your health care provider or pharmacist about other side effects. General instructions  Take over-the-counter and prescription medicines only as told by your health care provider.  Anticoagulant medicines may cause side effects, including easy bruising and difficulty stopping bleeding. If you are prescribed an anticoagulant: ? Hold pressure over cuts for longer than usual. ? Tell your dentist and other health care providers that you are taking anticoagulants before you have any procedure that may cause bleeding. ? Avoid contact sports. ? Be extra careful when handling sharp objects. ? Use a soft toothbrush. Floss with waxed dental floss. ? Shave with an Neuro[employer]ge[employer] razor.  Wear a medical alert bracelet or carry a medical alert card that says you have had a PE.  Ask your health care provider when you may return to your normal activities.  Talk with your health care provider about any travel plans. It is important to make sure that you are still able to take your medicine while on trips.  Keep all follow-up visits as told by your health care provider. This is important. How is this prevented? Take these actions to lower your risk of developing another PE:  Exercise regularly. Take frequent walks. For at least 30 minutes every day, engage in: ? Activity that involves moving your arms and legs. ? Activity that encourages good blood flow through your body by increasing your heart rate.  While traveling, drink plenty of water and avoid drinking alcohol. Ask your health care provider if you should wear below-the-knee compression stockings.  Avoid sitting or lying in bed for long periods of time without moving your legs. Exercise your arms and legs every hour during long-distance travel (over 4 hours).  If you are hospitalized or  have surgery, ask your health care provider about your risks and what treatments can help prevent blood clots.  Maintain a healthy weight. Ask your health care provider what weight is healthy for you.  If you are a woman who is over age 64, avoid unnecessary use of medicines that contain estrogen, including birth control pills.  Do not use any products that contain nicotine or tobacco, such as cigarettes and e-cigarettes. This is especially important if you take estrogen medicines. If you need help quitting, ask your health care provider.  See your health care provider for regular checkups. This may include blood tests and ultrasound testing on your legs to check for new blood clots.  Contact a health care provider if:  You missed a dose of your blood thinner medicine. Get help right away if:  You have new or increased pain, swelling, warmth, or redness in an arm or leg.  You have numbness or tingling in an arm or leg.  You have shortness of breath while active or at rest.  You have chest pain.  You have a rapid or irregular heartbeat.  You feel  light-headed or dizzy.  You cough up blood.  You have blood in your vomit, stool, or urine.  You have a fever.  You have abdomen (abdominal) pain.  You have a severe fall or head injury.  You have a severe headache.  You have vision changes.  You cannot move your arms or legs.  You are confused or have memory loss.  You are bleeding for 10 minutes or more, even with strong pressure on the wound. These symptoms may represent a serious problem that is an emergency. Do not wait to see if the symptoms will go away. Get medical help right away. Call your local emergency services (911 in the [place] Do not drive yourself to the hospital. Summary  A pulmonary embolism (PE) is a sudden blockage or decrease of blood flow in one lung or both lungs. PE is a dangerous and life-threatening condition that needs to be treated right  away.  Having deep vein thrombosis (DVT) or a history of DVT is the most common risk factor for PE.  Treatments for this condition usually include medicines to thin your blood (anticoagulants) or medicines to break apart blood clots (thrombolytics).  If you are prescribed blood thinners, it is important to take the medicine every single day at the same time each day.  If you have signs of PE or DVT, call your local emergency services (911 in the [place] This information is not intended to replace advice given to you by your health care provider. Make sure you discuss any questions you have with your health care provider. Document Released: 09/01/2000 Document Revised: 10/07/2016 Document Reviewed: 10/07/2016 Elsevier Interactive Patient Education  2018 [employer]  Deep Vein Thrombosis Deep vein thrombosis (DVT) is a condition in which a blood clot forms in a deep vein, such as a lower leg, thigh, or arm vein. A clot is blood that has thickened into a gel or solid. This condition is dangerous. It can lead to serious and even life-threatening complications if the clot travels to the lungs and causes a blockage (pulmonary embolism). It can also damage veins in the leg. This can result in leg pain, swelling, discoloration, and sores (post-thrombotic syndrome). What are the causes? This condition may be caused by:  A slowdown of blood flow.  Damage to a vein.  A condition that makes blood clot more easily.  What increases the risk? The following factors may make you more likely to develop this condition:  Being overweight.  Being elderly, especially over age 58.  Sitting or lying down for more than four hours.  Lack of physical activity (sedentary lifestyle).  Being pregnant, giving birth, or having recently given birth.  Taking medicines that contain estrogen.  Smoking.  A history of any of the following: ? Blood clots or blood clotting disease. ? Peripheral vascular  disease. ? Inflammatory bowel disease. ? Cancer. ? Heart disease. ? Genetic conditions that affect how blood clots. ? Neurological diseases that affect the legs (leg paresis). ? Injury. ? Major or lengthy surgery. ? A central line placed inside a large vein.  What are the signs or symptoms? Symptoms of this condition include:  Swelling, pain, or tenderness in an arm or leg.  Warmth, redness, or discoloration in an arm or leg.  If the clot is in your leg, symptoms may be more noticeable or worse when you stand or walk. Some people do not have any symptoms. How is this diagnosed? This condition is diagnosed with:  A medical  history.  A physical exam.  Tests, such as: ? Blood tests. These are done to see how your blood clots. ? Imaging tests. These are done to check for clots. Tests may include:  Ultrasound.  CT scan.  MRI.  X-ray.  Venogram. For this test, X-rays are taken after a dye is injected into a vein.  How is this treated? Treatment for this condition depends on the cause, your risk for bleeding or developing more clots, and any medical conditions you have. Treatment may include:  Taking blood thinners (also called anticoagulants). These medicines may be taken by mouth, injected under the skin, or injected through an IV tube (catheter). These medicines prevent clots from forming.  Injecting medicine that dissolves blood clots into the affected vein (catheter-directed thrombolysis).  Having surgery. Surgery may be done to: ? Remove the clot. ? Place a filter in a large vein to catch blood clots before they reach the lungs.  Some treatments may be continued for up to six months. Follow these instructions at home: If you are taking an oral blood thinner:  Take the medicine exactly as told by your health care provider. Some blood thinners need to be taken at the same time every day. Do not skip a dose.  Ask your health care provider about what foods and drugs  interact with the medicine.  Ask about possible side effects. General instructions  Blood thinners can cause easy bruising and difficulty stopping bleeding. Because of this, if you are taking or were given a blood thinner: ? Hold pressure over cuts for longer than usual. ? Tell your dentist and other health care providers that you are taking blood thinners before having any procedures that can cause bleeding. ? Avoid contact sports.  Take over-the-counter and prescription medicines only as told by your health care provider.  Return to your normal activities as told by your health care provider. Ask your health care provider what activities are safe for you.  Wear compression stockings if recommended by your health care provider.  Keep all follow-up visits as told by your health care provider. This is important. How is this prevented? To lower your risk of developing this condition again:  For 30 or more minutes every day, do an activity that: ? Involves moving your arms and legs. ? Increases your heart rate.  When traveling for longer than four hours: ? Exercise your arms and legs every hour. ? Drink plenty of water. ? Avoid drinking alcohol.  Avoid sitting or lying for a long time without moving your legs.  Stay a healthy weight.  If you are a woman who is older than age 11, avoid unnecessary use of medicines that contain estrogen.  Do not use any products that contain nicotine or tobacco, such as cigarettes and e-cigarettes. This is especially important if you take estrogen medicines. If you need help quitting, ask your health care provider.  Contact a health care provider if:  You miss a dose of your blood thinner.  You have nausea, vomiting, or diarrhea that lasts for more than one day.  Your menstrual period is heavier than usual.  You have unusual bruising. Get help right away if:  You have new or increased pain, swelling, or redness in an arm or leg.  You have  numbness or tingling in an arm or leg.  You have shortness of breath.  You have chest pain.  You have a rapid or irregular heartbeat.  You feel light-headed or dizzy.  You cough up blood.  There is blood in your vomit, stool, or urine.  You have a serious fall or accident, or you hit your head.  You have a severe headache or confusion.  You have a cut that will not stop bleeding. These symptoms may represent a serious problem that is an emergency. Do not wait to see if the symptoms will go away. Get medical help right away. Call your local emergency services (911 in the [place] Do not drive yourself to the hospital. Summary  DVT is a condition in which a blood clot forms in a deep vein, such as a lower leg, thigh, or arm vein.  Symptoms can include swelling, warmth, pain, and redness in your leg or arm.  Treatment may include taking blood thinners, injecting medicine that dissolves blood clots,wearing compression stockings, or surgery.  If you are prescribed blood thinners, take them exactly as told. This information is not intended to replace advice given to you by your health care provider. Make sure you discuss any questions you have with your health care provider. Document Released: 09/04/2005 Document Revised: 10/07/2016 Document Reviewed: 10/07/2016 Elsevier Interactive Patient Education  2018 [employer].  Aspiration Pneumonia Aspiration pneumonia is an infection in your lungs. It occurs when food, liquid, or stomach contents (vomit) are inhaled (aspirated) into your lungs. When these things get into your lungs, swelling (inflammation) and infection can occur. This can make it difficult for you to breathe. Aspiration pneumonia is a serious condition and can be life threatening. What increases the risk? Aspiration pneumonia is more likely to occur when a person's cough (gag) reflex or ability to swallow has been decreased. Some things that can do this include:  Having a  brain injury or disease, such as stroke, seizures, Parkinson's disease, dementia, or amyotrophic lateral sclerosis (ALS).  Being given general anesthetic for procedures.  Being in a coma (unconscious).  Having a narrowing of the tube that carries food to the stomach (esophagus).  Drinking too much alcohol. If a person passes out and vomits, vomit can be swallowed into the lungs.  Taking certain medicines, such as tranquilizers or sedatives.  What are the signs or symptoms?  Coughing after swallowing food or liquids.  Breathing problems, such as wheezing or shortness of breath.  Bluish skin. This can be caused by lack of oxygen.  Coughing up food or mucus. The mucus might contain blood, greenish material, or yellowish-white fluid (pus).  Fever.  Chest pain.  Being more tired than usual (fatigue).  Sweating more than usual.  Bad breath. How is this diagnosed? A physical exam will be done. During the exam, the health care provider will listen to your lungs with a stethoscope to check for:  Crackling sounds in the lungs.  Decreased breath sounds.  A rapid heartbeat.  Various tests may be ordered. These may include:  Chest X-ray.  CT scan.  Swallowing study. This test looks at how food is swallowed and whether it goes into your breathing tube (trachea) or food pipe (esophagus).  Sputum culture. Saliva and mucus (sputum) are collected from the lungs or the tubes that carry air to the lungs (bronchi). The sputum is then tested for bacteria.  Bronchoscopy. This test uses a flexible tube (bronchoscope) to see inside the lungs.  How is this treated? Treatment will usually include antibiotic medicines. Other medicines may also be used to reduce fever or pain. You may need to be treated in the hospital. In the hospital, your breathing will  be carefully monitored. Depending on how well you are breathing, you may need to be given oxygen, or you may need breathing support from a  breathing machine (ventilator). For people who fail a swallowing study, a feeding tube might be placed in the stomach, or they may be asked to avoid certain food textures or liquids when they eat. Follow these instructions at home:  Carefully follow any special eating instructions you were given, such as avoiding certain food textures or thickening liquids. This reduces the risk of developing aspiration pneumonia again.  Only take over-the-counter or prescription medicines as directed by your health care provider. Follow the directions carefully.  If you were prescribed antibiotics, take them as directed. Finish them even if you start to feel better.  Rest as instructed by your health care provider.  Keep all follow-up appointments with your health care provider. Contact a health care provider if:  You develop worsening shortness of breath, wheezing, or difficulty breathing.  You develop a fever.  You have chest pain. This information is not intended to replace advice given to you by your health care provider. Make sure you discuss any questions you have with your health care provider. Document Released: 07/02/2009 Document Revised: 02/16/2016 Document Reviewed: 02/20/2013 Elsevier Interactive Patient Education  2017 [employer].

## 2018-02-28 NOTE — Telephone Encounter (Signed)
French Anaracy,  We could go old school.Barbee Cough.Warfariin.. We would then need to set him up for home monitoring of PT-INR. He is too impaired to do outpt monitoring, I think  Theodoro Gristave

## 2018-02-28 NOTE — Telephone Encounter (Signed)
Dr. Terese Door,   Looks like [facility] is involved - Joetta Manners, PharmD is working on patient assistance for his Xarelto and his inhalers. I discussed this case with her. Patient is already qualified for 50% Low Income Subsidy. Appears that he does not qualify for Xarelto patient assistance yet as he has not met out of pocket spend requirements but Anderson Malta states that she is hopeful he will be approved for inhalers, which would free up $45 copays x2. Hopefully when he doesn't have to pay for inhalers he will be able to afford Xarelto until he qualifies for patient assistance. I believe we should have/do have samples of Xarelto in the office to hold him over in times of need.  Also appears that the patient has supply of Xarelto for now per [facility] notes.   If patient goes to ED/hospital for admission for the xarelto specifically, he will end up having to pay for that care and it is likely that he will be discharged on the same meds, same problems persist. Warfarin home monitoring is 20% coinsurance through Medicare Part B which can be pretty expensive too.   For now, it seems he has a 2-monthsupply of Xarelto. He can get one month free with a savings card: [email or web address]://[email or web address].[email or web address] . That will cover the next 2 months. We can hold him with samples if needed for a few more weeks after that. By then, we should have decision on inhaler patient assistance applications which could potentially save patient $90/month.   [facility] well for [facility].

## 2018-02-28 NOTE — Telephone Encounter (Signed)
Warfarin is a reasonable alternative and would likely be much less expensive but we would have to arrange outpt monitoring  Theodoro Gristave

## 2018-02-28 NOTE — Progress Notes (Signed)
Pre visit review using our clinic review tool, if applicable. No additional management support is needed unless otherwise documented below in the visit note. 

## 2018-02-28 NOTE — Progress Notes (Signed)
Chief Complaint  Patient presents with  . Follow-up   ED f/u  1. 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 Today he is on 3.5 L portable O2 desaturation to 77% with exertion. He c/o rattling in his chest but has not c/o wheezing. In 12/31/17 Levaquin 10 pills 500 mg qd was Rx and 01/10/18 Rx Levaquin 500 mg qd.    Review of Systems  Constitutional: Negative for weight loss.  HENT: Negative for hearing loss.   Eyes: Negative for blurred vision.  Respiratory: Positive for cough, sputum production and shortness of breath. Negative for wheezing.   Cardiovascular: Positive for leg swelling. Negative for chest pain.  Skin: Negative for rash.  Psychiatric/Behavioral: Negative for depression.   Past Medical History:  Diagnosis Date  . Allergy   . Anxiety   . Asthma   . Chronic diastolic CHF (congestive heart failure) ([place])    a. echo 07/2013: EF 60-65%, DD, biatrial dilatation, Ao sclerosis, dilated RV, moderate pulmonary HTN, elevated CV and RA pressures; b. patient reported echo at Dr. Laurelyn Sickle office 02/2015 - his office does not have record of him being a pt there c. echo 11/2015: EF 60-65%, Grade 1 DD, mod-severe pulm pressures  . Chronic respiratory failure (HCC)    a. on 2L via nasal cannula; b. secondary to COPD  . COPD (chronic obstructive pulmonary disease) (HCC)    on 2L continuous   . Depression   . Emphysema of lung ([place])   . GERD (gastroesophageal reflux disease)   . HCAP (healthcare-associated pneumonia)    01/22/18-01/28/18   . Headache   . Hypertension   . Personal history of tobacco use, presenting hazards to health 08/17/2015  . Pulmonary HTN ([place])   . Tobacco abuse    Past Surgical History:  Procedure Laterality Date  . ABDOMINAL SURGERY    . ADENOIDECTOMY    . CARPAL TUNNEL RELEASE Right   . corpal tunnel Right   . ELBOW  SURGERY Right    repaired tendon  . hemmorhoid N/A   . HEMORRHOID SURGERY    . NASAL SINUS SURGERY    . NASAL SINUS SURGERY     1970s  . NOSE SURGERY    . reflux surgery     1994  . ROTATOR CUFF REPAIR Right   . SHOULDER SURGERY    . TENNIS ELBOW RELEASE/NIRSCHEL PROCEDURE Right   . TONSILLECTOMY    . URETHRA SURGERY     surgery 6 times from age [age] yrs old  . URETHRA SURGERY     Family History  Problem Relation Age of Onset  . CAD Father   . Hyperlipidemia Father   . Stroke Father   . Heart disease Father   . Arthritis Father   . Hearing loss Father   . Hypertension Father   . Hypertension Mother   . Peripheral Artery Disease Mother   . Rheum arthritis Mother   . Asthma Mother   . Bipolar disorder Mother   . Depression Mother   . Malignant hypertension Mother   . Arthritis Mother   . Hearing loss Mother   . Heart disease Mother   . Hyperlipidemia Mother   . Kidney disease Mother   . Birth defects Brother   . Heart disease Brother   . Alcohol abuse Daughter   . Arthritis Daughter   . Asthma  Daughter   . Depression Daughter   . Drug abuse Daughter   . Miscarriages / [place] Daughter   . Intellectual disability Daughter   . Kidney disease Son   . Birth defects Paternal Grandmother   . Depression Paternal Grandmother   . Heart disease Paternal Grandmother   . Birth defects Sister   . Diabetes Neg Hx    Social History   Socioeconomic History  . Marital status: Legally Separated    Spouse name: Not on file  . Number of children: Not on file  . Years of education: Not on file  . Highest education level: Not on file  Occupational History  . Not on file  Social Needs  . Financial resource strain: Not on file  . Food insecurity:    Worry: Not on file    Inability: Not on file  . Transportation needs:    Medical: Not on file    Non-medical: Not on file  Tobacco Use  . Smoking status: Former Smoker    Packs/day: 2.00    Years: 40.00    Pack years:  80.00    Types: Cigarettes    Last attempt to quit: 01/14/2018    Years since quitting: 0.1  . Smokeless tobacco: Never Used  Substance and Sexual Activity  . Alcohol use: No  . Drug use: No  . Sexual activity: Not Currently  Lifestyle  . Physical activity:    Days per week: 0 days    Minutes per session: 0 min  . Stress: Only a little  Relationships  . Social connections:    Talks on phone: Not on file    Gets together: Not on file    Attends religious service: Not on file    Active member of club or organization: Not on file    Attends meetings of clubs or organizations: Not on file    Relationship status: Not on file  . Intimate partner violence:    Fear of current or ex partner: Not on file    Emotionally abused: Not on file    Physically abused: Not on file    Forced sexual activity: Not on file  Other Topics Concern  . Not on file  Social History Narrative   On 2 L chronic home O2.  Lives at home alone    3 kids    No guns    Wears seat belt    Safe in relationship    Current Meds  Medication Sig  . ALPRAZolam (XANAX) 1 MG tablet Take 1 tablet (1 mg total) by mouth 3 (three) times daily as needed for anxiety.  Marland Kitchen azithromycin (ZITHROMAX Z-PAK) 250 MG tablet Take 2 tablets (500 mg) on  Day 1,  followed by 1 tablet (250 mg) once daily on Days 2 through 5.  . Baclofen 5 MG TABS Take 1 tablet by mouth 2 (two) times daily as needed (muscle spasms).  . Cholecalciferol (VITAMIN D) 2000 units tablet Take 2,000 Units by mouth daily.  Marland Kitchen diltiazem (CARDIZEM) 30 MG tablet Take 30 mg by mouth daily.  Marland Kitchen escitalopram (LEXAPRO) 20 MG tablet Take 20 mg by mouth at bedtime.  . furosemide (LASIX) 40 MG tablet Take 1 tablet (40 mg total) by mouth daily. In am  . gabapentin (NEURONTIN) 300 MG capsule TAKE 1 TO 2 CAPSULES BY MOUTH THREE TIMES DAILY  . magnesium oxide (MAG-OX) 400 MG tablet Take 400 mg by mouth daily.  . mometasone-formoterol (DULERA) 200-5 MCG/ACT AERO Inhale 2  puffs  into the lungs 2 (two) times daily.  . potassium chloride (K-DUR,KLOR-CON) 10 MEQ tablet Take 20 mEq by mouth daily. Repeat if second LASIX taken  . predniSONE (DELTASONE) 50 MG tablet Take 1 tablet (50 mg total) by mouth daily for 4 days.  . Probiotic Product (PROBIOTIC PO) Take by mouth.  . Rivaroxaban 15 & 20 MG TBPK Take as directed on package: Start with one 35m tablet by mouth twice a day with food. On Day 22, switch to one 229mtablet once a day with food.  . Marland KitchenOPINIRole (REQUIP) 0.25 MG tablet TAKE 1 TABLET BY MOUTH TWICE DAILY FOR CRAMPS  . Spacer/Aero-Holding Chambers (OPTICHAMBER ADVANTAGE-LG MASK) MISC Use as directed with inhaler diag  j44.1  . theophylline (UNIPHYL) 400 MG 24 hr tablet Take 400 mg by mouth every morning.   . Tiotropium Bromide Monohydrate (SPIRIVA RESPIMAT) 2.5 MCG/ACT AERS Inhale into the lungs.   Allergies  Allergen Reactions  . Asa [Aspirin] Other (See Comments)    Reaction: swelling of the right side.  . Bee Venom Swelling  . Codeine Other (See Comments)    Reaction: Difficulty breathing  . Ibuprofen Other (See Comments)    Reaction: Swelling of the right side.   Recent Results (from the past 2160 hour(s))  Urinalysis, Complete w Microscopic     Status: Abnormal   Collection Time: 12/06/17 12:00 PM  Result Value Ref Range   Color, Urine STRAW (A) YELLOW   APPearance CLEAR (A) CLEAR   Specific Gravity, Urine 1.008 1.005 - 1.030   pH 7.0 5.0 - 8.0   Glucose, UA NEGATIVE NEGATIVE mg/dL   Hgb urine dipstick NEGATIVE NEGATIVE   Bilirubin Urine NEGATIVE NEGATIVE   Ketones, ur NEGATIVE NEGATIVE mg/dL   Protein, ur NEGATIVE NEGATIVE mg/dL   Nitrite NEGATIVE NEGATIVE   Leukocytes, UA NEGATIVE NEGATIVE   RBC / HPF 0-5 0 - 5 RBC/hpf   WBC, UA 0-5 0 - 5 WBC/hpf   Bacteria, UA NONE SEEN NONE SEEN   Squamous Epithelial / LPF 0-5 (A) NONE SEEN   Mucus PRESENT     Comment: Performed at [facility] [place] [id number] Urine Culture     Status: Abnormal   Collection Time: 12/06/17 12:00 PM  Result Value Ref Range   Specimen Description      URINE, CLEAN CATCH Performed at [facility] [place] [id number]  Special Requests      NONE Performed at [facility] [place] [address]  Culture (A)     <10,000 COLONIES/mL INSIGNIFICANT GROWTH Performed at [address] [place] [id number]  Report Status 12/07/2017 FINAL   CBC     Status: Abnormal   Collection Time: 12/26/17 11:24 AM  Result Value Ref Range   WBC 6.2 3.8 - 10.6 K/uL   RBC 4.31 (L) 4.40 - 5.90 MIL/uL   Hemoglobin 13.8 13.0 - 18.0 g/dL   HCT 41.5 40.0 - 52.0 %   MCV 96.4 80.0 - 100.0 fL   MCH 32.1 26.0 - 34.0 pg   MCHC 33.3 32.0 - 36.0 g/dL   RDW 14.1 11.5 - 14.5 %   Platelets 176 150 - 440 K/uL    Comment: Performed at [facility] [place] [id number] metabolic panel     Status: Abnormal   Collection  Time: 01/15/18 11:39 AM  Result Value Ref Range   Sodium 140 135 - 145 mmol/L   Potassium 3.9 3.5 - 5.1 mmol/L   Chloride 97 (L) 101 - 111 mmol/L   CO2 36 (H) 22 - 32 mmol/L   Glucose, Bld 93 65 - 99 mg/dL   BUN 20 6 - 20 mg/dL   Creatinine, Ser 1.15 0.61 - 1.24 mg/dL   Calcium 8.8 (L) 8.9 - 10.3 mg/dL   GFR calc non Af Amer >60 >60 mL/min   GFR calc Af Amer >60 >60 mL/min    Comment: (NOTE) The eGFR has been calculated using the CKD EPI equation. This calculation has not been validated in all clinical situations. eGFR's persistently <60 mL/min signify possible Chronic Kidney Disease.    Anion gap 7 5 - 15    Comment: Performed at [facility], [address] [place], [place] [address]  Hepatic function panel     Status: Abnormal   Collection Time: 01/15/18 11:39 AM  Result Value Ref Range   Total Protein 6.4 (L) 6.5 - 8.1 g/dL   Albumin 3.7 3.5 - 5.0 g/dL   AST 17 15 -  41 U/L   ALT 12 (L) 17 - 63 U/L   Alkaline Phosphatase 52 38 - 126 U/L   Total Bilirubin 1.2 0.3 - 1.2 mg/dL   Bilirubin, Direct 0.2 0.1 - 0.5 mg/dL   Indirect Bilirubin 1.0 (H) 0.3 - 0.9 mg/dL    Comment: Performed at [facility], [address] [place], [place] [address]  CBC WITH DIFFERENTIAL     Status: Abnormal   Collection Time: 01/15/18 11:39 AM  Result Value Ref Range   WBC 14.5 (H) 3.8 - 10.6 K/uL   RBC 4.58 4.40 - 5.90 MIL/uL   Hemoglobin 14.6 13.0 - 18.0 g/dL   HCT 44.4 40.0 - 52.0 %   MCV 96.8 80.0 - 100.0 fL   MCH 31.8 26.0 - 34.0 pg   MCHC 32.9 32.0 - 36.0 g/dL   RDW 15.0 (H) 11.5 - 14.5 %   Platelets 173 150 - 440 K/uL   Neutrophils Relative % 84 %   Neutro Abs 12.1 (H) 1.4 - 6.5 K/uL   Lymphocytes Relative 8 %   Lymphs Abs 1.1 1.0 - 3.6 K/uL   Monocytes Relative 7 %   Monocytes Absolute 1.0 0.2 - 1.0 K/uL   Eosinophils Relative 1 %   Eosinophils Absolute 0.1 0 - 0.7 K/uL   Basophils Relative 0 %   Basophils Absolute 0.0 0 - 0.1 K/uL    Comment: Performed at [facility], [address] [place], [place] [address]  Brain natriuretic peptide     Status: Abnormal   Collection Time: 01/15/18 11:39 AM  Result Value Ref Range   B Natriuretic Peptide 113.0 (H) 0.0 - 100.0 pg/mL    Comment: Performed at [facility], [address] [place], [place] [address]  TSH     Status: None   Collection Time: 01/15/18 11:39 AM  Result Value Ref Range   TSH 0.701 0.350 - 4.500 uIU/mL    Comment: Performed by a 3rd Generation assay with a functional sensitivity of <=0.01 uIU/mL. Performed at [facility], [address] [place],  [address]   Culture, expectorated sputum-assessment     Status: None   Collection Time: 01/15/18 11:43 AM  Result Value Ref Range   Specimen Description SPUTUM    Special Requests Normal    Sputum  evaluation      THIS SPECIMEN IS ACCEPTABLE FOR SPUTUM CULTURE Performed at [facility],  [address] [place], [place] [address]    Report Status 01/15/2018 FINAL   Culture, respiratory (NON-Expectorated)     Status: None   Collection Time: 01/15/18 11:43 AM  Result Value Ref Range   Specimen Description      SPUTUM Performed at [facility], [address], [place], [place] [address]    Special Requests      Normal Reflexed from [id number] Performed at [facility], [place], [place] [address]    Gram Stain      ABUNDANT WBC PRESENT,BOTH PMN AND MONONUCLEAR RARE GRAM POSITIVE COCCI    Culture      FEW Consistent with normal respiratory flora. Performed at [facility], [place] [address] [place], [place] [address]    Report Status 01/18/2018 FINAL   Basic metabolic panel     Status: Abnormal   Collection Time: 01/16/18  5:33 AM  Result Value Ref Range   Sodium 140 135 - 145 mmol/L   Potassium 4.1 3.5 - 5.1 mmol/L   Chloride 98 (L) 101 - 111 mmol/L   CO2 37 (H) 22 - 32 mmol/L   Glucose, Bld 155 (H) 65 - 99 mg/dL   BUN 24 (H) 6 - 20 mg/dL   Creatinine, Ser 1.29 (H) 0.61 - 1.24 mg/dL   Calcium 8.9 8.9 - 10.3 mg/dL   GFR calc non Af Amer 57 (L) >60 mL/min   GFR calc Af Amer >60 >60 mL/min    Comment: (NOTE) The eGFR has been calculated using the CKD EPI equation. This calculation has not been validated in all clinical situations. eGFR's persistently <60 mL/min signify possible Chronic Kidney Disease.    Anion gap 5 5 - 15    Comment: Performed at [facility], [address] [place], [place] [address]  CBC     Status: Abnormal   Collection Time: 01/16/18  5:33 AM  Result Value Ref Range   WBC 12.8 (H) 3.8 - 10.6 K/uL   RBC 4.39 (L) 4.40 - 5.90 MIL/uL   Hemoglobin 13.9 13.0 - 18.0 g/dL   HCT 42.7 40.0 - 52.0 %   MCV 97.2 80.0 - 100.0 fL   MCH 31.7 26.0 - 34.0 pg   MCHC 32.6 32.0 - 36.0 g/dL   RDW 15.2 (H) 11.5 - 14.5 %   Platelets 182 150 - 440 K/uL    Comment: Performed at [facility], [address] [place],  [address]  Basic metabolic panel     Status: Abnormal   Collection Time: 01/17/18  5:25 AM  Result Value Ref Range   Sodium 140 135 - 145 mmol/L   Potassium 4.1 3.5 - 5.1 mmol/L   Chloride 99 (L) 101 - 111 mmol/L   CO2 35 (H) 22 - 32 mmol/L   Glucose, Bld 128 (H) 65 - 99 mg/dL   BUN 35 (H) 6 - 20 mg/dL   Creatinine, Ser 1.06 0.61 - 1.24 mg/dL   Calcium 9.0 8.9 - 10.3 mg/dL   GFR calc non Af Amer >60 >60 mL/min   GFR calc Af Amer >60 >60 mL/min    Comment: (NOTE) The eGFR has been calculated using the CKD EPI equation. This calculation has not been validated in all clinical situations. eGFR's persistently <60 mL/min signify possible Chronic Kidney Disease.    Anion gap 6 5 -  15    Comment: Performed at [facility], [address] [place], [place] [address]  Procalcitonin - Baseline     Status: None   Collection Time: 01/17/18  5:25 AM  Result Value Ref Range   Procalcitonin <0.10 ng/mL    Comment:        Interpretation: PCT (Procalcitonin) <= 0.5 ng/mL: Systemic infection (sepsis) is not likely. Local bacterial infection is possible. (NOTE)       Sepsis PCT Algorithm           Lower Respiratory Tract                                      Infection PCT Algorithm    ----------------------------     ----------------------------         PCT < 0.25 ng/mL                PCT < 0.10 ng/mL         Strongly encourage             Strongly discourage   discontinuation of antibiotics    initiation of antibiotics    ----------------------------     -----------------------------       PCT 0.25 - 0.50 ng/mL            PCT 0.10 - 0.25 ng/mL               OR       >80% decrease in PCT            Discourage initiation of                                            antibiotics      Encourage discontinuation           of antibiotics    ----------------------------     -----------------------------         PCT >= 0.50 ng/mL              PCT 0.26 - 0.50  ng/mL               AND        <80% decrease in PCT             Encourage initiation of                                             antibiotics       Encourage continuation           of antibiotics    ----------------------------     -----------------------------        PCT >= 0.50 ng/mL                  PCT > 0.50 ng/mL               AND         increase in PCT                  Strongly encourage  initiation of antibiotics    Strongly encourage escalation           of antibiotics                                     -----------------------------                                           PCT <= 0.25 ng/mL                                                 OR                                        > 80% decrease in PCT                                     Discontinue / Do not initiate                                             antibiotics Performed at [facility], [address] [place], [place] [address]   Basic metabolic panel     Status: Abnormal   Collection Time: 01/19/18  9:46 AM  Result Value Ref Range   Sodium 137 135 - 145 mmol/L   Potassium 4.9 3.5 - 5.1 mmol/L   Chloride 96 (L) 101 - 111 mmol/L   CO2 35 (H) 22 - 32 mmol/L   Glucose, Bld 159 (H) 65 - 99 mg/dL   BUN 34 (H) 6 - 20 mg/dL   Creatinine, Ser 1.16 0.61 - 1.24 mg/dL   Calcium 9.1 8.9 - 10.3 mg/dL   GFR calc non Af Amer >60 >60 mL/min   GFR calc Af Amer >60 >60 mL/min    Comment: (NOTE) The eGFR has been calculated using the CKD EPI equation. This calculation has not been validated in all clinical situations. eGFR's persistently <60 mL/min signify possible Chronic Kidney Disease.    Anion gap 6 5 - 15    Comment: Performed at [facility], [address] [place], [place] [address]  Magnesium     Status: None   Collection Time: 01/19/18  9:46 AM  Result Value Ref Range   Magnesium 2.4 1.7 - 2.4 mg/dL    Comment: Performed at [facility],  [address] [place],  [address]  Basic metabolic panel     Status: Abnormal   Collection Time: 01/20/18  3:40 AM  Result Value Ref Range   Sodium 140 135 - 145 mmol/L   Potassium 4.3 3.5 - 5.1 mmol/L   Chloride 98 (L) 101 - 111 mmol/L   CO2 37 (H) 22 - 32 mmol/L   Glucose, Bld 99 65 - 99 mg/dL   BUN 34 (H) 6 - 20 mg/dL   Creatinine, Ser 1.05 0.61 - 1.24 mg/dL   Calcium  8.7 (L) 8.9 - 10.3 mg/dL   GFR calc non Af Amer >60 >60 mL/min   GFR calc Af Amer >60 >60 mL/min    Comment: (NOTE) The eGFR has been calculated using the CKD EPI equation. This calculation has not been validated in all clinical situations. eGFR's persistently <60 mL/min signify possible Chronic Kidney Disease.    Anion gap 5 5 - 15    Comment: Performed at [facility], [address] [place], [place] [address]  Magnesium     Status: None   Collection Time: 01/20/18  3:40 AM  Result Value Ref Range   Magnesium 2.3 1.7 - 2.4 mg/dL    Comment: Performed at [facility], [address] [place], [place] [address]  Basic metabolic panel     Status: Abnormal   Collection Time: 01/22/18  8:07 PM  Result Value Ref Range   Sodium 140 135 - 145 mmol/L   Potassium 4.6 3.5 - 5.1 mmol/L    Comment: HEMOLYSIS AT THIS LEVEL MAY AFFECT RESULT   Chloride 100 (L) 101 - 111 mmol/L   CO2 30 22 - 32 mmol/L   Glucose, Bld 179 (H) 65 - 99 mg/dL   BUN 45 (H) 6 - 20 mg/dL   Creatinine, Ser 1.33 (H) 0.61 - 1.24 mg/dL   Calcium 9.2 8.9 - 10.3 mg/dL   GFR calc non Af Amer 55 (L) >60 mL/min   GFR calc Af Amer >60 >60 mL/min    Comment: (NOTE) The eGFR has been calculated using the CKD EPI equation. This calculation has not been validated in all clinical situations. eGFR's persistently <60 mL/min signify possible Chronic Kidney Disease.    Anion gap 10 5 - 15    Comment: Performed at [facility], [address] [place], [place] [address]  CBC     Status: Abnormal   Collection Time:  01/22/18  8:07 PM  Result Value Ref Range   WBC 18.5 (H) 3.8 - 10.6 K/uL   RBC 5.23 4.40 - 5.90 MIL/uL   Hemoglobin 16.5 13.0 - 18.0 g/dL   HCT 50.6 40.0 - 52.0 %   MCV 96.7 80.0 - 100.0 fL   MCH 31.6 26.0 - 34.0 pg   MCHC 32.7 32.0 - 36.0 g/dL   RDW 15.5 (H) 11.5 - 14.5 %   Platelets 168 150 - 440 K/uL    Comment: Performed at [facility], [address] [place], [place] [address]  Troponin I     Status: Abnormal   Collection Time: 01/22/18  8:07 PM  Result Value Ref Range   Troponin I 0.03 (HH) <0.03 ng/mL    Comment: CRITICAL RESULT CALLED TO, READ BACK BY AND VERIFIED WITH REBECCA UHORCHUK ON 01/22/18 AT 2038 JAG Performed at [facility], [address] [place], [place] [address]   Blood culture (routine x 2)     Status: None   Collection Time: 01/22/18 11:56 PM  Result Value Ref Range   Specimen Description BLOOD RIGHT AC    Special Requests      BOTTLES DRAWN AEROBIC AND ANAEROBIC Blood Culture adequate volume   Culture      NO GROWTH 5 DAYS Performed at [facility], [address] [place], [place] [address]    Report Status 01/28/2018 FINAL   Blood culture (routine x 2)     Status: None   Collection Time: 01/22/18 11:56 PM  Result Value Ref Range   Specimen Description BLOOD RIGHT  HAND    Special Requests      BOTTLES DRAWN AEROBIC AND ANAEROBIC Blood Culture adequate volume   Culture      NO GROWTH 5 DAYS Performed at [facility], [address] [place], [place] [address]    Report Status 01/28/2018 FINAL   Culture, sputum-assessment     Status: None   Collection Time: 01/23/18  1:42 AM  Result Value Ref Range   Specimen Description EXPECTORATED SPUTUM    Special Requests NONE    Sputum evaluation      THIS SPECIMEN IS ACCEPTABLE FOR SPUTUM CULTURE Performed at [facility], [address] [place], [place] [address]    Report Status 01/23/2018 FINAL   Culture, respiratory (NON-Expectorated)      Status: None   Collection Time: 01/23/18  1:42 AM  Result Value Ref Range   Specimen Description      EXPECTORATED SPUTUM Performed at [facility], [address], [place], [place] [address]    Special Requests      NONE Reflexed from [id number] Performed at [facility], [place], [place] [address]    Gram Stain      RARE WBC PRESENT, PREDOMINANTLY PMN FEW GRAM POSITIVE COCCI IN CLUSTERS FEW GRAM POSITIVE RODS RARE GRAM NEGATIVE RODS    Culture      Consistent with normal respiratory flora. Performed at [facility], [place] [address] [place], [place] [address]    Report Status 01/25/2018 FINAL   Strep pneumoniae urinary antigen     Status: None   Collection Time: 01/23/18  5:18 AM  Result Value Ref Range   Strep Pneumo Urinary Antigen NEGATIVE NEGATIVE    Comment:        Infection due to S. pneumoniae cannot be absolutely ruled out since the antigen present may be below the detection limit of the test. Performed at [facility], [place] [address] [place], [place] [address]   HIV antibody (Routine Screening)     Status: None   Collection Time: 01/23/18  6:07 AM  Result Value Ref Range   HIV Screen 4th Generation wRfx Non Reactive Non Reactive    Comment: (NOTE) Performed At: [facility] [address] [place], [place] [id number] Rush Farmer MD OE:[id number] Performed at [facility], [address] [place], [place] [address]   Troponin I     Status: None   Collection Time: 01/23/18  6:07 AM  Result Value Ref Range   Troponin I <0.03 <0.03 ng/mL    Comment: Performed at [facility], [address] [place], [place] [address]  MRSA PCR Screening     Status: None   Collection Time: 01/23/18  2:52 PM  Result Value Ref Range   MRSA by PCR NEGATIVE NEGATIVE    Comment:        The GeneXpert MRSA Assay (FDA approved for NASAL specimens only), is one component of a comprehensive MRSA  colonization surveillance program. It is not intended to diagnose MRSA infection nor to guide or monitor treatment for MRSA infections. Performed at [facility], [address] [place], [place] [address]   Procalcitonin - Baseline     Status: None   Collection Time: 01/23/18  3:17 PM  Result Value Ref Range   Procalcitonin 0.12 ng/mL    Comment:        Interpretation: PCT (Procalcitonin) <= 0.5 ng/mL: Systemic infection (sepsis) is not likely. Local bacterial infection is  possible. (NOTE)       Sepsis PCT Algorithm           Lower Respiratory Tract                                      Infection PCT Algorithm    ----------------------------     ----------------------------         PCT < 0.25 ng/mL                PCT < 0.10 ng/mL         Strongly encourage             Strongly discourage   discontinuation of antibiotics    initiation of antibiotics    ----------------------------     -----------------------------       PCT 0.25 - 0.50 ng/mL            PCT 0.10 - 0.25 ng/mL               OR       >80% decrease in PCT            Discourage initiation of                                            antibiotics      Encourage discontinuation           of antibiotics    ----------------------------     -----------------------------         PCT >= 0.50 ng/mL              PCT 0.26 - 0.50 ng/mL               AND        <80% decrease in PCT             Encourage initiation of                                             antibiotics       Encourage continuation           of antibiotics    ----------------------------     -----------------------------        PCT >= 0.50 ng/mL                  PCT > 0.50 ng/mL               AND         increase in PCT                  Strongly encourage                                      initiation of antibiotics    Strongly encourage escalation           of antibiotics                                     -----------------------------  PCT <= 0.25 ng/mL                                                 OR                                        > 80% decrease in PCT                                     Discontinue / Do not initiate                                             antibiotics Performed at [facility], [address] [place], [place] [address]   Procalcitonin     Status: None   Collection Time: 01/24/18  6:12 AM  Result Value Ref Range   Procalcitonin <0.10 ng/mL    Comment:        Interpretation: PCT (Procalcitonin) <= 0.5 ng/mL: Systemic infection (sepsis) is not likely. Local bacterial infection is possible. (NOTE)       Sepsis PCT Algorithm           Lower Respiratory Tract                                      Infection PCT Algorithm    ----------------------------     ----------------------------         PCT < 0.25 ng/mL                PCT < 0.10 ng/mL         Strongly encourage             Strongly discourage   discontinuation of antibiotics    initiation of antibiotics    ----------------------------     -----------------------------       PCT 0.25 - 0.50 ng/mL            PCT 0.10 - 0.25 ng/mL               OR       >80% decrease in PCT            Discourage initiation of                                            antibiotics      Encourage discontinuation           of antibiotics    ----------------------------     -----------------------------         PCT >= 0.50 ng/mL              PCT 0.26 - 0.50 ng/mL               AND        <80% decrease in PCT  Encourage initiation of                                             antibiotics       Encourage continuation           of antibiotics    ----------------------------     -----------------------------        PCT >= 0.50 ng/mL                  PCT > 0.50 ng/mL               AND         increase in PCT                  Strongly encourage                                      initiation of  antibiotics    Strongly encourage escalation           of antibiotics                                     -----------------------------                                           PCT <= 0.25 ng/mL                                                 OR                                        > 80% decrease in PCT                                     Discontinue / Do not initiate                                             antibiotics Performed at [name], [address] [place], [place] [address]   Glucose, capillary     Status: Abnormal   Collection Time: 01/24/18  6:08 PM  Result Value Ref Range   Glucose-Capillary 116 (H) 65 - 99 mg/dL  Glucose, capillary     Status: Abnormal   Collection Time: 01/24/18  8:55 PM  Result Value Ref Range   Glucose-Capillary 140 (H) 65 - 99 mg/dL   Comment 1 Notify RN    Comment 2 Document in Chart   Procalcitonin     Status: None   Collection Time: 01/25/18  5:40 AM  Result Value Ref Range   Procalcitonin <0.10 ng/mL    Comment:  Interpretation: PCT (Procalcitonin) <= 0.5 ng/mL: Systemic infection (sepsis) is not likely. Local bacterial infection is possible. (NOTE)       Sepsis PCT Algorithm           Lower Respiratory Tract                                      Infection PCT Algorithm    ----------------------------     ----------------------------         PCT < 0.25 ng/mL                PCT < 0.10 ng/mL         Strongly encourage             Strongly discourage   discontinuation of antibiotics    initiation of antibiotics    ----------------------------     -----------------------------       PCT 0.25 - 0.50 ng/mL            PCT 0.10 - 0.25 ng/mL               OR       >80% decrease in PCT            Discourage initiation of                                            antibiotics      Encourage discontinuation           of antibiotics    ----------------------------     -----------------------------         PCT >= 0.50  ng/mL              PCT 0.26 - 0.50 ng/mL               AND        <80% decrease in PCT             Encourage initiation of                                             antibiotics       Encourage continuation           of antibiotics    ----------------------------     -----------------------------        PCT >= 0.50 ng/mL                  PCT > 0.50 ng/mL               AND         increase in PCT                  Strongly encourage                                      initiation of antibiotics    Strongly encourage escalation           of antibiotics                                     -----------------------------  PCT <= 0.25 ng/mL                                                 OR                                        > 80% decrease in PCT                                     Discontinue / Do not initiate                                             antibiotics Performed at [facility], [address] [place], [place] [address]   CBC     Status: Abnormal   Collection Time: 01/25/18  5:40 AM  Result Value Ref Range   WBC 14.3 (H) 3.8 - 10.6 K/uL   RBC 4.47 4.40 - 5.90 MIL/uL   Hemoglobin 14.2 13.0 - 18.0 g/dL   HCT 43.0 40.0 - 52.0 %   MCV 96.2 80.0 - 100.0 fL   MCH 31.6 26.0 - 34.0 pg   MCHC 32.9 32.0 - 36.0 g/dL   RDW 14.6 (H) 11.5 - 14.5 %   Platelets 154 150 - 440 K/uL    Comment: Performed at [facility], [address] [place], [place] [address]  Basic metabolic panel     Status: Abnormal   Collection Time: 01/25/18  5:40 AM  Result Value Ref Range   Sodium 139 135 - 145 mmol/L   Potassium 5.0 3.5 - 5.1 mmol/L   Chloride 100 (L) 101 - 111 mmol/L   CO2 34 (H) 22 - 32 mmol/L   Glucose, Bld 116 (H) 65 - 99 mg/dL   BUN 39 (H) 6 - 20 mg/dL   Creatinine, Ser 1.03 0.61 - 1.24 mg/dL   Calcium 9.0 8.9 - 10.3 mg/dL   GFR calc non Af Amer >60 >60 mL/min   GFR calc Af Amer >60 >60 mL/min    Comment:  (NOTE) The eGFR has been calculated using the CKD EPI equation. This calculation has not been validated in all clinical situations. eGFR's persistently <60 mL/min signify possible Chronic Kidney Disease.    Anion gap 5 5 - 15    Comment: Performed at [facility], [place], [place] [address]  Glucose, capillary     Status: Abnormal   Collection Time: 01/25/18  7:58 AM  Result Value Ref Range   Glucose-Capillary 126 (H) 65 - 99 mg/dL  Glucose, capillary     Status: None   Collection Time: 01/25/18 11:32 AM  Result Value Ref Range   Glucose-Capillary 82 65 - 99 mg/dL  Glucose, capillary     Status: Abnormal   Collection Time: 01/25/18  4:56 PM  Result Value Ref Range   Glucose-Capillary 109 (H) 65 - 99 mg/dL  Glucose, capillary     Status: Abnormal   Collection Time: 01/25/18  8:56 PM  Result Value Ref Range   Glucose-Capillary 144 (H) 65 - 99  mg/dL   Comment 1 Notify RN    Comment 2 Document in Chart   Glucose, capillary     Status: Abnormal   Collection Time: 01/26/18  8:20 AM  Result Value Ref Range   Glucose-Capillary 164 (H) 65 - 99 mg/dL  Glucose, capillary     Status: None   Collection Time: 01/26/18  1:07 PM  Result Value Ref Range   Glucose-Capillary 89 65 - 99 mg/dL  Glucose, capillary     Status: Abnormal   Collection Time: 01/26/18  5:06 PM  Result Value Ref Range   Glucose-Capillary 142 (H) 65 - 99 mg/dL  Glucose, capillary     Status: Abnormal   Collection Time: 01/26/18  9:12 PM  Result Value Ref Range   Glucose-Capillary 103 (H) 65 - 99 mg/dL  Glucose, capillary     Status: Abnormal   Collection Time: 01/27/18  7:52 AM  Result Value Ref Range   Glucose-Capillary 113 (H) 65 - 99 mg/dL  Glucose, capillary     Status: None   Collection Time: 01/27/18 12:05 PM  Result Value Ref Range   Glucose-Capillary 94 65 - 99 mg/dL  Glucose, capillary     Status: Abnormal   Collection Time: 01/27/18  4:39 PM  Result Value Ref Range    Glucose-Capillary 113 (H) 65 - 99 mg/dL  Glucose, capillary     Status: Abnormal   Collection Time: 01/27/18  9:06 PM  Result Value Ref Range   Glucose-Capillary 134 (H) 65 - 99 mg/dL  Glucose, capillary     Status: Abnormal   Collection Time: 01/28/18  7:37 AM  Result Value Ref Range   Glucose-Capillary 131 (H) 65 - 99 mg/dL  Glucose, capillary     Status: None   Collection Time: 01/28/18 11:20 AM  Result Value Ref Range   Glucose-Capillary 80 65 - 99 mg/dL  Glucose, capillary     Status: Abnormal   Collection Time: 01/28/18  4:30 PM  Result Value Ref Range   Glucose-Capillary 115 (H) 65 - 99 mg/dL  CBC     Status: Abnormal   Collection Time: 02/26/18  7:13 PM  Result Value Ref Range   WBC 8.6 3.8 - 10.6 K/uL   RBC 3.82 (L) 4.40 - 5.90 MIL/uL   Hemoglobin 12.6 (L) 13.0 - 18.0 g/dL   HCT 37.4 (L) 40.0 - 52.0 %   MCV 97.9 80.0 - 100.0 fL   MCH 33.0 26.0 - 34.0 pg   MCHC 33.8 32.0 - 36.0 g/dL   RDW 15.3 (H) 11.5 - 14.5 %   Platelets 241 150 - 440 K/uL    Comment: Performed at [facility], [address] [place], [place] [address]  Comprehensive metabolic panel     Status: Abnormal   Collection Time: 02/26/18  7:13 PM  Result Value Ref Range   Sodium 143 135 - 145 mmol/L   Potassium 3.9 3.5 - 5.1 mmol/L   Chloride 98 (L) 101 - 111 mmol/L   CO2 35 (H) 22 - 32 mmol/L   Glucose, Bld 86 65 - 99 mg/dL   BUN 15 6 - 20 mg/dL   Creatinine, Ser 1.22 0.61 - 1.24 mg/dL   Calcium 9.3 8.9 - 10.3 mg/dL   Total Protein 6.7 6.5 - 8.1 g/dL   Albumin 3.4 (L) 3.5 - 5.0 g/dL   AST 17 15 - 41 U/L   ALT 11 (L) 17 - 63 U/L   Alkaline Phosphatase 58 38 - 126  U/L   Total Bilirubin 0.6 0.3 - 1.2 mg/dL   GFR calc non Af Amer >60 >60 mL/min   GFR calc Af Amer >60 >60 mL/min    Comment: (NOTE) The eGFR has been calculated using the CKD EPI equation. This calculation has not been validated in all clinical situations. eGFR's persistently <60 mL/min signify possible Chronic  Kidney Disease.    Anion gap 10 5 - 15    Comment: Performed at [facility], [address] [place], [place] [address]  Troponin I     Status: Abnormal   Collection Time: 02/26/18  7:13 PM  Result Value Ref Range   Troponin I 0.03 (HH) <0.03 ng/mL    Comment: CRITICAL RESULT CALLED TO, READ BACK BY AND VERIFIED WITH LISA THOMPSON AT 2024 02/26/2018.  TFK Performed at [facility], [address] [place], [place] [address]   Brain natriuretic peptide     Status: Abnormal   Collection Time: 02/26/18  8:44 PM  Result Value Ref Range   B Natriuretic Peptide 158.0 (H) 0.0 - 100.0 pg/mL    Comment: Performed at [facility], [address] [place], [place] [address]   Objective  Body mass index is 30.85 kg/m. Wt Readings from Last 3 Encounters:  02/28/18 215 lb (97.5 kg)  02/26/18 215 lb (97.5 kg)  02/26/18 215 lb 9.6 oz (97.8 kg)   Temp Readings from Last 3 Encounters:  02/28/18 98.1 F (36.7 C) (Oral)  02/26/18 98.1 F (36.7 C) (Oral)  02/26/18 98.8 F (37.1 C) (Oral)   BP Readings from Last 3 Encounters:  02/28/18 112/64  02/26/18 128/71  02/26/18 132/68   Pulse Readings from Last 3 Encounters:  02/28/18 100  02/26/18 97  02/26/18 (!) 125    Physical Exam  Constitutional: He is oriented to person, place, and time. Vital signs are normal. He appears well-developed and well-nourished.  HENT:  Head: Normocephalic and atraumatic.  Mouth/Throat: Oropharynx is clear and moist and mucous membranes are normal.  Eyes: Pupils are equal, round, and reactive to light. Conjunctivae are normal.  Cardiovascular: Normal rate, regular rhythm and normal heart sounds.  Pulmonary/Chest: Effort normal and breath sounds normal.  Improved lung sounds no wheezing or rattling in chest today  On 3.5 L O2   Neurological: He is alert and oriented to person, place, and time. Gait normal.  Skin: Skin is warm, dry and intact.  Psychiatric: He has a normal  mood and affect. His speech is normal and behavior is normal. Judgment and thought content normal. Cognition and memory are normal.  Nursing note and vitals reviewed.   Assessment   1. DVT b/l ext with PE concerning on CTA chest 02/26/18. I am c/w malignancy underlying  2. C/w aspiration vs postobstructive pneumonia 2/2 neoplasm  3. Severe emphysema uncontrolled still with acute on chronic respiratory failure with hypoxia with exertion dest to 77 % with exertion on 3.5 L O2 today  Also h/o pulmonary HTN Plan   1.  Cont xarelto 15 mg bid x 21 days and 20 mg qd day 22 explained to pt  He does have xarelto given samples of 20 mg x total 14 tablets (2 boxed)  Will have pt f/u with lung doctor Dr. Leonidas Romberg and try to get appt before 03/18/18  2. D/c Zpack and trial of Augmentin disc with pharmacist Zacarias Pontes today  He has already completed Levaquin 500 mg qd 12/31/17 10 pills then 01/10/18 5 pills   This  will need to be worked up with pulm explained to pt today my concern  3. F/u with pulm sooner than 03/18/18 if worsening rec go to ED or call 911   Provider: Dr. Olivia Mackie McLean-Scocuzza-Internal Medicine

## 2018-03-01 ENCOUNTER — Other Ambulatory Visit: Payer: Self-pay | Admitting: *Deleted

## 2018-03-01 ENCOUNTER — Ambulatory Visit: Payer: Self-pay

## 2018-03-01 ENCOUNTER — Other Ambulatory Visit: Payer: Self-pay

## 2018-03-01 ENCOUNTER — Encounter: Payer: Self-pay | Admitting: Internal Medicine

## 2018-03-01 NOTE — Patient Outreach (Signed)
[facility]) Care Management  03/01/2018  Dustin CoriaDennis R [name] [birth year] [id number]  Dustin NickelDennis R [name] an 62 y.o.gentleman with past medical history which includes COPD (Gold IV Classificaton), asthma, hypertension, anxiety and depression, and GERD who has had4inpatient hospital admissions in the last 6 months, all for respiratory ailments:   09/18/17-09/24/17: COPD exacerbation with HCAP 11/22/17-11/29/17: COPD exacerbation/respiratory failure with intubation (Pulmonolgy f/u appointments 12/18/17 & 01/15/18) 01/15/18- 01/21/18:COPD exacerbationand acute/chronic CHF 5/719-01/28/18: COPD exacerbation 02/26/18 - ED visit from PCP office (DVT)   COPD Management- Dustin [name] presented to the ED from his PCP office on 02/26/18 because of concern about lower extremity swelling. He was seen in the ED and a venous doppler was performed showing a DVT in the popliteal vein and a CT Angio Chest which showed "suspicion of subsegmental pulmonary emboli lingula and LLL". He was evaluated in the ED, prescribed Xarelto, and discharged to home.   Dustin [name] has expressed concern re: cost of Xarelto which is $45 out of pocket for him. Our pharmacy team is following Dustin [name] closely. He currently has a 27 day supply on hand from the prescription he had filled and picked up on Wednesday. He also has a 14 day supply of samples. [place] HunJennifer Mendenhall PharmD [facility]) has reached out to Dr. Vilma PraderMcLean-Scoucuzza to ask her to consider changing Dustin [name] to Eliquis as he may be eligible for Eliquis patient assistance.   I spoke with Dustin [name] today and he says he feels "ok" today. He says he is concerned because his primary care doctor "brought up the C word". He also reported that she changed his antibiotics from azithromycin to Augmentin. Dustin [name] says he is taking all medications as prescribed.   Dustin [name] denies new or worsened symptoms today and says he does not feel he has a fever nor is his cough or  shortness of breath worse than usual.   Plan: Dustin [name] will see Dr. Sung AmabileSimonds (pulmonary) on 03/18/18 or sooner if appointment is available. Dustin [name]'s will visit with his HHRN next week.  I will reach out to Dustin [name] on Monday by phone to follow up on his weekend.   [facility] CM Care Plan Problem One     Most Recent Value  Care Plan Problem One  Knowledge Deficits related to changes in pulmonary plan of care  Role Documenting the Problem One  Care Management Coordinator  Care Plan for Problem One  Active  [facility] Long Term Goal   Over the next 60 days, patient will verbalize and demonstrate knowledge of changes to plan of care for management of pulmonary disease as outlined by new pulmonnary provider  [name] Long [facility]al Start Date  02/27/18  Interventions for Problem One Long Term Goal  reviewed updates to plan of care an dmedication regimen as per PCP orders  [facility] CM Short Term Goal #1   Over the next 30 days, patient will report new or worsened symptoms to pulmonary or prmary care or 911 as appropriate  [facility] CM Short Term Goal #1 Start Date  02/28/18  Interventions for Short Term Goal #1  reviewed signs and symptoms of worsening respiratory status and when to call for hlp  [facility] CM Short Term Goal #2   Over the next 30 days, patient will take all medications including inhalers as prescribed   [facility] CM Short Term Goal #2 Start Date  03/01/18  Interventions for Short Term Goal #2  reviewed medications and changes to regimen,  confirmed ongoing  collaboration with [facility] CM Pharmacy team  [facility] CM Short Term Goal #3  Over the next 7 days, patient will work with pharmacy team to ascertaiin medication assistance options  [facility] CM Short Term Goal #3 Start Date  02/27/18  Interventions for Short Tern Goal #3  confirmed collaboration with [facility] CM Pharmacist  [facility] CM Short Term Goal #4  Over the next 30 days, patient will attend all scheduled provider appointments  [facility] CM Short Term Goal #4 Start Date  02/27/18   Interventions for Short Term Goal #4  reviewed provider appointment schedule      Marja Kays MHA,BSN,RN,CCM [facility] Care Management  [phone]

## 2018-03-01 NOTE — Patient Outreach (Signed)
[facility]) Care Management  03/01/2018  MATTHER LABELL [birth year] [id number]  64year oldmalereferred to [place] Management.[place] services requested for medication assistance for his inhalers. PMHx includes, but not limited to, heart failure, pulmonary hypertension, hypertension and COPD.  Unsuccessful outreach attempt to Mr. Smiddy.Left HIPAA compliant voice message requesting a return call.  Incoming call received from Mr. Brasher.  HIPAA identifier verified.   Subjective: Mr. Bahe reports that he went to he his new PCP, Dr. Terese Door yesterday.  He reports that she changed his antibiotic to Augmentin.  He states that the doctor brought up the "C word" cancer as a potential cause of his recent DVT/PE and he is understandably concerned.  He states that the doctor did not want to switch him to coumadin due to all the required monitoring.  Patient reports that he received samples of Xarelto.    Medication Assistance: Per MD note, patient received an additional 14 days of Xarelto yesterday.  [place] contacted his insurance and determined his out-of-pocket (OOP) expenditure on prescriptions is $213.54 for 2019.  Mr. Richrd Sox would need to spend 4% OOP to apply for Xarelto patient assistance and 3% to apply for Elquis patient assistance.  Per financial discussion, patient would need to spend $489 to apply for Eliquis from [employer] ([employer]).  Despite the fact that he has not met this OOP amount, [facility] feels that we should apply for Eliquis patient assistance since it requires a lower OOP.   I discussed this with patient and he desires to apply if PCP will allow switch.  I contacted [employer] today and he can still apply for Eliquis patient assistance, even though he has 50% Partial Extra Help LIS.   Plan: Outreach to Dr. Danae Orleans to see if she will switch to Eliquis if [place] can get the application approved.  Until the time of  approval, patient should remain on Xarelto.   Follow up with [name], CPhT, in regards his [employer] patient assistance application for [facility] and Proventil HFA.   Joetta Manners, PharmD Clinical Pharmacist [place] [phone]

## 2018-03-01 NOTE — Progress Notes (Signed)
I think that will be fine but if he has cancer I.e lung then LMWH, edoxaban is more likely appropriate  Thanks for your help  Please start the process and I gave him Xarelto 20 mg 14 tablets yesterday in clinic samples    TMS

## 2018-03-04 ENCOUNTER — Other Ambulatory Visit: Payer: Self-pay

## 2018-03-04 ENCOUNTER — Other Ambulatory Visit: Payer: Self-pay | Admitting: *Deleted

## 2018-03-04 ENCOUNTER — Other Ambulatory Visit: Payer: Self-pay | Admitting: Pharmacy Technician

## 2018-03-04 NOTE — Patient Outreach (Signed)
[name](THN) Care Management  03/04/2018  Dustin CoriaDennis R [name] [birth year] [id number]  Dustin NickelDennis R Winburnis an 62 y.o.gentleman with past medical history which includes COPD (Gold IV Classificaton), asthma, hypertension, anxiety and depression, and GERD who has had4inpatient hospital admissions in the last 6 months, all for respiratory ailments:   09/18/17-09/24/17: COPD exacerbation with HCAP 11/22/17-11/29/17: COPD exacerbation/respiratory failure with intubation (Pulmonolgy f/u appointments 12/18/17 & 01/15/18) 01/15/18- 01/21/18:COPD exacerbationand acute/chronic CHF 5/719-01/28/18: COPD exacerbation 02/26/18- ED visit from PCP office (DVT)   COPD Management- I reached out to Dustin [name] today to follow up on his weekend and symptom management. Today, Dustin [name] says his breathing is unchanged and describes his respiratory status as "stable".   DVT/PE - Dustin [name] today says that both his legs are somewhat swollen with R>L but he does not feel they are more swollen than they were on Friday. He says his legs are not painful unless someone "presses on them". He reports being able to walk and move about freely as usual. He is taking all medications, including Xarelto, as prescribed.   Home Health Follow Up - Dustin [name] says he has not heard from his home health nurse today regarding her next visit for this week. I have placed Dustin [name] on my schedule for a visit at his home tomorrow.   Dustin [name]'s HCPOA is his sister [name] [phone]) who is visiting Dustin [name] from out of town to help him get transitioned to his new apartment. She is in town until Thursday and helping provide hands on care for Dustin [name]. He says she has a healthcare background and he would like for me to address her questions about his care, particularly management of his DVT. I told Dustin [name] I would be happy to talk with Dustin [name] and would be on the lookout for her call.    Psychosocial - Dustin [name] plans to be moved in [place] by no later than Thursday. I will update his address in the medical record when he has confirmed the move. I also have notified [facility] BSW Dustin [name] re: [name]'s plan for moving this week.   Plan: I will visit Dustin [name] at his home tomorrow 03/05/18. Dustin [name] will see Dustin [name] (pulmonary) on 03/18/18 or sooner if appointment is available.   [facility] CM Care Plan Problem One     Most Recent Value  Care Plan Problem One  Knowledge Deficits related to changes in pulmonary plan of care  Role Documenting the Problem One  Care Management Coordinator  Care Plan for Problem One  Active  [facility] Long Term Goal   Over the next 60 days, patient will verbalize and demonstrate knowledge of changes to plan of care for management of pulmonary disease as outlined by new pulmonnary provider  [facility] Start Date  02/27/18  Interventions for Problem One Long Term Goal  reviewed current status,  addressed questions,  discussed current/updated plan of care  [facility] CM Short Term Goal #1   Over the next 30 days, patient will report new or worsened symptoms to pulmonary or prmary care or 911 as appropriate  [facility] CM Short Term Goal #1 Start Date  02/28/18  Interventions for Short Term Goal #1  discussed current status and signs and symptoms of worsening respiratory status and when to call provider vs 911  [facility] CM Short Term Goal #2   Over the next 30 days, patient will take  all medications including inhalers as prescribed   [facility] CM Short Term Goal #2 Start Date  03/01/18  Interventions for Short Term Goal #2  medications reviewed  [facility] CM Short Term Goal #3  Over the next 7 days, patient will work with pharmacy team to ascertaiin medication assistance options  [facility] CM Short Term Goal #3 Start Date  02/27/18  Interventions for Short Tern Goal #3  noted ongoing collaboration with [facility] CM Pharmacy team  [facility] CM Short  Term Goal #4  Over the next 30 days, patient will attend all scheduled provider appointments  [facility] CM Short Term Goal #4 Start Date  02/27/18  Interventions for Short Term Goal #4  reviewed provider appointment schedule      Marja Kays MHA,BSN,RN,CCM [facility] Care Management  [phone]

## 2018-03-04 NOTE — Telephone Encounter (Signed)
Family is asking you to call with results. Not sure what results and they want to discuss blood clots. See message below.   Patient sister on dpr [place] poa in from [place] and wants to talk to the nurse about blood clots that were found in lungs.  She hasnt heard anything about the results .

## 2018-03-04 NOTE — Patient Outreach (Signed)
[name]N) Care Management  03/04/2018  Dustin CoriaDennis R [name] 1955-11-21 [id number]  Received [employer] patient assistance referral from [name] [facility]N RPh [place] HunJennifer Mendenhall for Eliquis. Prepared patient portion to be mailed and faxed provider portion to Dr. [name].  Will follow up with patient in 5-7 business days to confirm application was received.  [name], CPhT [name] Care Management [phone]

## 2018-03-04 NOTE — Telephone Encounter (Signed)
Informed front staff that the family will have to wait on a call. I asked that they get a name and number of 1 person and message will be forwarded to DS.

## 2018-03-04 NOTE — Patient Outreach (Addendum)
[name] ([facility]) Care Management  03/04/2018  [name] 06/20/1956 [id number]   In-basket message received from Dr. Mc-Lean-Scocuzza.  She states that it will be fine to start the patient assistance application process for apixaban.  She indicated that she may want to start a LMWH if it is determined that [name] has lung cancer.  Lovenox patient assistance from [employer] requires a 5% out of pocket expenditure to apply for their program.  He has not met that yet to qualify.  Generic enoxaparin will be more reasonably priced, but still may be out of Mr. [name]'s ability to afford.   Plan: CPhT, Ashley Coleman will begin apixaban patient assistance application through [employer].  [name], PharmD Clinical Pharmacist [name] [phone]        

## 2018-03-04 NOTE — Telephone Encounter (Signed)
Patient sister on dpr and poa in from [place] and wants to talk to the nurse about blood clots that were found in lungs.  She hasnt heard anything about the results .  Waiting in Lobby.

## 2018-03-05 ENCOUNTER — Telehealth: Payer: Self-pay

## 2018-03-05 ENCOUNTER — Ambulatory Visit: Payer: Self-pay

## 2018-03-05 ENCOUNTER — Other Ambulatory Visit: Payer: Self-pay | Admitting: *Deleted

## 2018-03-05 ENCOUNTER — Other Ambulatory Visit: Payer: Self-pay

## 2018-03-05 DIAGNOSIS — K219 Gastro-esophageal reflux disease without esophagitis: Secondary | ICD-10-CM | POA: Diagnosis not present

## 2018-03-05 DIAGNOSIS — Z7952 Long term (current) use of systemic steroids: Secondary | ICD-10-CM | POA: Diagnosis not present

## 2018-03-05 DIAGNOSIS — J9611 Chronic respiratory failure with hypoxia: Secondary | ICD-10-CM | POA: Diagnosis not present

## 2018-03-05 DIAGNOSIS — J441 Chronic obstructive pulmonary disease with (acute) exacerbation: Secondary | ICD-10-CM | POA: Diagnosis not present

## 2018-03-05 DIAGNOSIS — G629 Polyneuropathy, unspecified: Secondary | ICD-10-CM | POA: Diagnosis not present

## 2018-03-05 DIAGNOSIS — I5033 Acute on chronic diastolic (congestive) heart failure: Secondary | ICD-10-CM | POA: Diagnosis not present

## 2018-03-05 DIAGNOSIS — J45909 Unspecified asthma, uncomplicated: Secondary | ICD-10-CM | POA: Diagnosis not present

## 2018-03-05 DIAGNOSIS — Z7951 Long term (current) use of inhaled steroids: Secondary | ICD-10-CM | POA: Diagnosis not present

## 2018-03-05 DIAGNOSIS — I110 Hypertensive heart disease with heart failure: Secondary | ICD-10-CM | POA: Diagnosis not present

## 2018-03-05 DIAGNOSIS — Z9981 Dependence on supplemental oxygen: Secondary | ICD-10-CM | POA: Diagnosis not present

## 2018-03-05 NOTE — Patient Outreach (Signed)
[name]) Care Management   03/05/2018  Dustin [name] 09-Mar-1956 [id number]  Dustin [name] is an 62 y.o.gentleman with past medical history which includes COPD (Gold IV Classificaton), asthma, hypertension, anxiety and depression, and GERD who has had4inpatient hospital admissions in the last 6 months, all for respiratory ailments:   09/18/17-09/24/17: COPD exacerbation with HCAP 11/22/17-11/29/17: COPD exacerbation/respiratory failure with intubation (Pulmonolgy f/u appointments 12/18/17 & 01/15/18) 01/15/18- 01/21/18:COPD exacerbationand acute/chronic CHF 5/719-01/28/18: COPD exacerbation 02/26/18- ED visit from PCP office (DVT)   I am seeing Dustin [name] at home today for a routine home visit.  Subjective: "I'm feeling okay. Nothing special."  Objective:  BP 110/62   Pulse 98   SpO2 (!) 89%    Calf circumference:  Left = 14 1/4 inches Right = 15 inches  Review of Systems  Constitutional: Positive for malaise/fatigue. Negative for fever.  HENT: Negative.   Eyes: Negative.   Respiratory: Positive for cough, sputum production and shortness of breath. Negative for wheezing.        Intermittent cough productive of moderately thick white sputum Shortness of breath and fatigue with ANY activity  Cardiovascular: Positive for leg swelling. Negative for chest pain and palpitations.       1+ edema LLE toes to knee 2+ edema RLE toes to knee  Gastrointestinal: Negative for abdominal pain, nausea and vomiting.  Genitourinary: Negative.   Musculoskeletal: Negative for falls.  Skin:       Mild pinkness noted top of right foot - not warm to touch  Neurological: Positive for weakness. Negative for dizziness, tingling, sensory change and focal weakness.       Generally weak and deconditioned  Psychiatric/Behavioral: The patient is nervous/anxious.        Mildly anxious (baseline)    Physical Exam  Constitutional: He is oriented to person, place, and time. He  appears well-developed and well-nourished. He has a sickly appearance.  Cardiovascular: Normal rate.  Respiratory: Effort normal. No respiratory distress. He has decreased breath sounds in the right middle field, the right lower field, the left middle field and the left lower field. He has no wheezes. He has no rhonchi. He has no rales.  Breath sounds diminished throughout (baseline)  GI: Soft. Bowel sounds are normal. He exhibits no distension.  Neurological: He is alert and oriented to person, place, and time.  Skin: Skin is warm, dry and intact.  Psychiatric: He has a normal mood and affect. His speech is normal and behavior is normal. Judgment and thought content normal. Cognition and memory are normal.    Encounter Medications:   Outpatient Encounter Medications as of 03/05/2018  Medication Sig  . ALPRAZolam (XANAX) 1 MG tablet Take 1 tablet (1 mg total) by mouth 3 (three) times daily as needed for anxiety.  Marland Kitchen amoxicillin-clavulanate (AUGMENTIN) 875-125 MG tablet Take 1 tablet by mouth 2 (two) times daily. With food  . Baclofen 5 MG TABS Take 1 tablet by mouth 2 (two) times daily as needed (muscle spasms).  . Cholecalciferol (VITAMIN D) 2000 units tablet Take 2,000 Units by mouth daily.  Marland Kitchen diltiazem (CARDIZEM) 30 MG tablet Take 30 mg by mouth daily.  Marland Kitchen escitalopram (LEXAPRO) 20 MG tablet Take 20 mg by mouth at bedtime.  . furosemide (LASIX) 40 MG tablet Take 1 tablet (40 mg total) by mouth daily. In am  . gabapentin (NEURONTIN) 300 MG capsule TAKE 1 TO 2 CAPSULES BY MOUTH THREE TIMES DAILY  . magnesium oxide (MAG-OX) 400 MG tablet  Take 400 mg by mouth daily.  . mometasone-formoterol (DULERA) 200-5 MCG/ACT AERO Inhale 2 puffs into the lungs 2 (two) times daily.  . potassium chloride (K-DUR,KLOR-CON) 10 MEQ tablet Take 20 mEq by mouth daily. Repeat if second LASIX taken  . Probiotic Product (PROBIOTIC PO) Take by mouth.  . Rivaroxaban 15 & 20 MG TBPK Take as directed on package: Start with  one 15mg  tablet by mouth twice a day with food. On Day 22, switch to one 20mg  tablet once a day with food.  Marland Kitchen. rOPINIRole (REQUIP) 0.25 MG tablet TAKE 1 TABLET BY MOUTH TWICE DAILY FOR CRAMPS  . Spacer/Aero-Holding Chambers (OPTICHAMBER ADVANTAGE-LG MASK) MISC Use as directed with inhaler diag  j44.1  . theophylline (UNIPHYL) 400 MG 24 hr tablet Take 400 mg by mouth every morning.   . Tiotropium Bromide Monohydrate (SPIRIVA RESPIMAT) 2.5 MCG/ACT AERS Inhale into the lungs.   Assessment:  62 year old male with advanced pulmonary disease and recent new finding DVT/PE.   COPD Management- Today, Dustin [name]'s breath sounds are diminished (baseline) but without rales/rhonchi/wheezes. He does not experience respiratory distress while sitting/having conversation on O2 @ 2L/[place] but has poor exercise tolerance. His O2 sat at rest on 2l/[place] today is 88-89%. He has a mild intermittent cough (baseline) productive of moderately thick white sputum which he says today he can clear easily. He is taking all prescribed medications.    DVT/PE - Dustin [name] has swelling of bilateral lower extremities R>L w/ measured circumference: Right = 15inchest Left = 14 1/4inchest at the calf. He says his legs are not painful when I examine them today and gently palpate from the pre-tibial area to the popliteal space anterior/posterior.Dustin [name] is taking Xarelto as prescribed.   Home Health Follow Up - Dustin [name] says he has not heard from his home health nurse since last week. I reached out to [facility] and was informed that Dustin [name] is scheduled to be seen by his home health nurse today.     Dustin [name]'s HCPOA is his sister [name] [phone]) who is visiting Dustin [name] from out of town to help him get transitioned to his new apartment. She is in town until Thursday and helping provide hands on care for Dustin [name]. I spoke with Mrs. [name] by phone today per Dustin [name]'s request to provide an  update to her on my assessment and to review his plan of care.    Psychosocial - Mr. Netzley plans to be moved in to his new apartment in [place] by no later than Thursday. I will update his address in the medical record when he has confirmed the move. I also have notified [facility] Care Management BSW Amber Chrismon re: Dustin [name]'s plan for moving this week.   Plan: Dustin [name] will see Dr. Sung AmabileSimonds (pulmonary) on 03/18/18 or sooner if appointment is available.   [facility] CM Care Plan Problem One     Most Recent Value  Care Plan Problem One  Knowledge Deficits related to changes in pulmonary plan of care  Role Documenting the Problem One  Care Management Coordinator  Care Plan for Problem One  Active  [facility] Long Term Goal   Over the next 60 days, patient will verbalize and demonstrate knowledge of changes to plan of care for management of pulmonary disease as outlined by new pulmonnary provider  [facility] Long [facility] Goal Start Date  02/27/18  Interventions for Problem One Long Term Goal  in person review of current plan  of care with recent changes  [facility] CM Short Term Goal #1   Over the next 30 days, patient will report new or worsened symptoms to pulmonary or prmary care or 911 as appropriate  [facility] CM Short Term Goal #1 Start Date  02/28/18  Interventions for Short Term Goal #1  reviewed signs and symptoms and when/whom to call for urgent needs  [facility] CM Short Term Goal #2   Over the next 30 days, patient will take all medications including inhalers as prescribed   [facility] CM Short Term Goal #2 Start Date  03/01/18  Interventions for Short Term Goal #2  review of medications,  collaboration with PharmD  [facility] CM Short Term Goal #3  Over the next 7 days, patient will work with pharmacy team to ascertaiin medication assistance options  [facility] CM Short Term Goal #3 Start Date  02/27/18  Interventions for Short Tern Goal #3  advised patient to anticipate Eliquis patient assistance forms  [facility] CM Short Term Goal #4  Over the  next 30 days, patient will attend all scheduled provider appointments  [facility] CM Short Term Goal #4 Start Date  02/27/18  Interventions for Short Term Goal #4  reviewed provider appointment schedule      Marja Kays MHA,BSN,RN,CCM [facility] Care Management  [phone]

## 2018-03-05 NOTE — Telephone Encounter (Signed)
Please make f/u sooner with Dr. Sung AmabileSimonds   TMS

## 2018-03-05 NOTE — Telephone Encounter (Signed)
Copied from CRM [phone]. Topic: Inquiry >> Mar 05, 2018 11:18 AM Windy KalataMichael, Taylor L, NT wrote: Reason for CRM: patient is calling and would like to know if Dr. Shirlee LatchMclean was able to get in contact with Dr. Billy Fischeravid Simonds. Please contact pt.

## 2018-03-05 NOTE — Patient Outreach (Signed)
[name]) Care Management  03/05/2018  Dustin CoriaDennis R [name] 10/05/1955 [id number]  61year oldmalereferred to [facility] requested for medication assistance for his inhalers. PMHx includes, but not limited to, heart failure, pulmonary hypertension, hypertension and COPD.  Successful outreach attempt to Dustin [name].  HIPAA identifiers verified.   Medication Assistance: Informed Dustin [name] that [facility] has begun the application process for Eliquis and that he should expect the mail to arrive within a week for him to fill out.  He verbalized understanding.    Plan: Follow along with application process with Dustin [name], Dustin ShookAshley [name].   [place] HunJennifer Waylan Busta, PharmD Clinical Pharmacist [name] [phone]

## 2018-03-05 NOTE — Telephone Encounter (Signed)
fyi

## 2018-03-06 ENCOUNTER — Other Ambulatory Visit: Payer: Self-pay | Admitting: *Deleted

## 2018-03-06 DIAGNOSIS — I2699 Other pulmonary embolism without acute cor pulmonale: Secondary | ICD-10-CM

## 2018-03-06 NOTE — Telephone Encounter (Signed)
He has been rescheduled for Tuesday June 25 at 8:45 am with Dr.Simonds. LVM for him to rtc for new appt info.

## 2018-03-06 NOTE — Progress Notes (Signed)
Ref to cardiology placed for coumadin clinic per DS.

## 2018-03-08 ENCOUNTER — Telehealth: Payer: Self-pay | Admitting: *Deleted

## 2018-03-08 NOTE — Telephone Encounter (Signed)
Ok   Thanks TMS 

## 2018-03-08 NOTE — Telephone Encounter (Signed)
Copied from CRM [phone]. Topic: Inquiry >> Mar 08, 2018 10:13 AM Yvonna Alanisobinson, Andra M wrote: Reason for CRM: Carollee HerterShannon with [facility] [phone]) called requesting verbal orders for Continued Nursing services for two additional weeks. Please call Carollee HerterShannon with [facility] at [phone].       Thank You!!!

## 2018-03-11 ENCOUNTER — Other Ambulatory Visit: Payer: Self-pay | Admitting: *Deleted

## 2018-03-11 ENCOUNTER — Ambulatory Visit: Payer: Medicare Other | Admitting: *Deleted

## 2018-03-11 NOTE — Patient Outreach (Signed)
[name]) Care Management   03/11/2018  Dustin [name] 27-Jun-1956 [id number]  Dustin [name] is an 62 y.o.gentleman with past medical history which includes COPD (Gold IV Classificaton), asthma, hypertension, anxiety and depression, and GERD who has had4inpatient hospital admissions in the last 6 months, all for respiratory ailments:   09/18/17-09/24/17: COPD exacerbation with HCAP 11/22/17-11/29/17: COPD exacerbation/respiratory failure with intubation (Pulmonolgy f/u appointments 12/18/17 & 01/15/18) 01/15/18- 01/21/18:COPD exacerbationand acute/chronic CHF 5/719-01/28/18: COPD exacerbation 02/26/18 - ED visit from PCP office (DVT)   I am visiting 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 [name] continues on oral anticoagulation, reports feeling much better this week, and has a significantly improved physical assessment.   Subjective: "I'm doing a lot better this week and its really nice to be able to get rest in my new apartment. I just found out that my daughter in law and both granddaughters have strep!"  Objective: BP (!) 90/52   Pulse 97   SpO2 92% Comment: 2l/[place]  ROS  Review of Systems  Constitutional: Positive for malaise/fatigue. Negative for fever.  HENT: Negative.   Eyes: Negative.   Respiratory:  Negative for cough, positive for minimal sputum production and shortness of breath with exercise. Negative for wheezing.        Cardiovascular: Positive for leg swelling. Negative for chest pain and palpitations.       1+ edema RLE & LLE toes to mid calf Gastrointestinal: Negative for abdominal pain, nausea and vomiting.  Genitourinary: Negative.   Musculoskeletal: Negative for falls.  Skin: warm/dry/intact Neurological: Negative for dizziness, tingling, sensory change and focal weakness.       Generally deconditioned   Psychiatric/Behavioral:      Mildly anxious (baseline)  Physical Exam  Physical Exam  Constitutional: He is oriented to person, place, and time. He appears well-developed and well-nourished.  Cardiovascular: Normal rate.  Respiratory: Effort normal. No respiratory distress. He has diminished breath sounds throughout. He has no wheezes. He has no rhonchi. He has no rales.  Breath sounds diminished throughout (baseline)  GI: Soft. Bowel sounds are normal. He exhibits no distension.  Neurological: He is alert and oriented to person, place, and time.  Skin: Skin is warm, dry and intact.  Psychiatric: He has a normal mood and affect. His speech is normal and behavior is normal. Judgment and thought content normal. Cognition and memory are normal.   Encounter Medications:   Outpatient Encounter Medications as of 03/11/2018  Medication Sig  . ALPRAZolam (XANAX) 1 MG tablet Take 1 tablet (1 mg total) by mouth 3 (three) times daily as needed for anxiety.  Marland Kitchen amoxicillin-clavulanate (AUGMENTIN) 875-125 MG tablet Take 1 tablet by mouth 2 (two) times daily. With food  . Baclofen 5 MG TABS Take 1 tablet by mouth 2 (two) times daily as needed (muscle spasms).  . Cholecalciferol (VITAMIN D) 2000 units tablet Take 2,000 Units by mouth daily.  Marland Kitchen diltiazem (CARDIZEM) 30 MG tablet Take 30 mg by mouth daily.  Marland Kitchen escitalopram (LEXAPRO) 20 MG tablet Take 20 mg by mouth at bedtime.  . furosemide (LASIX) 40 MG tablet Take 1 tablet (40 mg total) by mouth daily. In am  . gabapentin (NEURONTIN) 300 MG capsule TAKE 1 TO 2 CAPSULES BY MOUTH THREE TIMES DAILY  . magnesium oxide (MAG-OX) 400 MG tablet Take 400 mg by mouth  daily.  . mometasone-formoterol (DULERA) 200-5 MCG/ACT AERO Inhale 2 puffs into the lungs 2 (two) times daily.  . potassium chloride (K-DUR,KLOR-CON) 10 MEQ tablet Take 20 mEq by mouth daily. Repeat if second LASIX taken  . Probiotic Product (PROBIOTIC PO) Take by mouth.  . Rivaroxaban 15 & 20 MG  TBPK Take as directed on package: Start with one 15mg  tablet by mouth twice a day with food. On Day 22, switch to one 20mg  tablet once a day with food.  Marland Kitchen rOPINIRole (REQUIP) 0.25 MG tablet TAKE 1 TABLET BY MOUTH TWICE DAILY FOR CRAMPS  . Spacer/Aero-Holding Chambers (OPTICHAMBER ADVANTAGE-LG MASK) MISC Use as directed with inhaler diag  j44.1  . theophylline (UNIPHYL) 400 MG 24 hr tablet Take 400 mg by mouth every morning.   . Tiotropium Bromide Monohydrate (SPIRIVA RESPIMAT) 2.5 MCG/ACT AERS Inhale into the lungs.   Fall/Depression Screening:    Fall Risk  03/11/2018 02/28/2018 12/19/2017  Falls in the past year? No No No  Number falls in past yr: - - -  Injury with Fall? - - -  Risk for fall due to : - - -  Risk for fall due to: Comment - - -  Follow up - - -   Assessment:  62 year old male with advanced pulmonary disease and recent new finding DVT/PE.   [name] has moved from his son's home to his new apartment in [place]. It is exceptionally clean and without carpets. No pets are in the home. [name] tells me that his daughter in law and 2 grand-daughters had been sick the week before he moved and were diagnosed with Strep.   COPD Management- Today, [name] breath sounds are diminished (baseline) but without rales/rhonchi/wheezes. He does not experience respiratory distress while sitting/having conversation on O2 @ 2L/[place] but has poor exercise tolerance. His O2 sat at rest on 2l/[place] today is 90-92%. He does not have a cough today. He is taking all prescribed medications.    DVT/PE- [name] has swelling of bilateral lower extremities but it is significantly improved this week compared to last. His legs are not painful when I examine them today and gently palpate from the pre-tibial area to the popliteal space anterior/posterior.[name] is taking Xarelto as prescribed.   Medication Management - [name] had on hand the Eliquis patient assistance forms sent by our  pharmacy team. I helped him complete his portion and he is to take the forms to both his provider appointments tomorrow to request that one of his doctor's complete the provider section. I reviewed with him how to mail the forms once completed.   Home Health Follow Up- Mr. Grau is being followd by [facility] for home health nursing services at his new address in [place].      Psychosocial- Mr. Shillingford is moved in to his new apartment in [place] and is very pleased about having privacy and his own space.    Plan:Mr. Sharpe will see Dr. Sung Amabile and Dr. Shirlee Latch tomorrow as scheduled. Mr. Mckey will call his home health nurse with any questions about her visit schedule. I will reach out to Mr. Kuiken by phone next week to follow up on his progress.   [facility] CM Care Plan Problem One     Most Recent Value  Care Plan Problem One  Knowledge Deficits related to changes in pulmonary plan of care  Role Documenting the Problem One  Care Management Coordinator  Care Plan for Problem One  Active  [facility] Long Term Goal   Over the next 60 days, patient will verbalize and demonstrate knowledge of changes to plan of care for management of pulmonary disease as outlined by new pulmonnary provider  [facility] Long Term Goal Start Date  02/27/18  [facility] CM Short Term Goal #1   Over the next 30 days, patient will report new or worsened symptoms to pulmonary or prmary care or 911 as appropriate  [facility] CM Short Term Goal #1 Start Date  02/28/18  [facility] CM Short Term Goal #2   Over the next 30 days, patient will take all medications including inhalers as prescribed   [facility] CM Short Term Goal #2 Start Date  03/01/18  [facility] Goal #3  Over the next 7 days, patient will work with pharmacy team to ascertaiin medication assistance options  [facility] CM Short Term Goal #3 Start Date  02/27/18  [facility] CM Short Term Goal #4  Over the next 30 days, patient will attend all scheduled provider appointments  [facility] CM Short Term  Goal #4 Start Date  02/27/18  Interventions for Short Term Goal #4  reviewed provider appointment schedule      Marja Kayslisa Madylin Fairbank MHA,BSN,RN,CCM [facility] Care Management  [phone]

## 2018-03-12 ENCOUNTER — Ambulatory Visit (INDEPENDENT_AMBULATORY_CARE_PROVIDER_SITE_OTHER): Payer: Medicare Other | Admitting: Internal Medicine

## 2018-03-12 ENCOUNTER — Encounter: Payer: Self-pay | Admitting: Pulmonary Disease

## 2018-03-12 ENCOUNTER — Ambulatory Visit (INDEPENDENT_AMBULATORY_CARE_PROVIDER_SITE_OTHER): Payer: Medicare Other | Admitting: Pulmonary Disease

## 2018-03-12 ENCOUNTER — Encounter: Payer: Self-pay | Admitting: Internal Medicine

## 2018-03-12 VITALS — BP 100/60 | HR 101 | Temp 98.5°F | Ht 70.0 in | Wt 216.8 lb

## 2018-03-12 VITALS — BP 126/80 | HR 116 | Ht 70.0 in | Wt 212.0 lb

## 2018-03-12 DIAGNOSIS — J439 Emphysema, unspecified: Secondary | ICD-10-CM | POA: Diagnosis not present

## 2018-03-12 DIAGNOSIS — J690 Pneumonitis due to inhalation of food and vomit: Secondary | ICD-10-CM | POA: Diagnosis not present

## 2018-03-12 DIAGNOSIS — I82403 Acute embolism and thrombosis of unspecified deep veins of lower extremity, bilateral: Secondary | ICD-10-CM

## 2018-03-12 DIAGNOSIS — I2699 Other pulmonary embolism without acute cor pulmonale: Secondary | ICD-10-CM

## 2018-03-12 DIAGNOSIS — I10 Essential (primary) hypertension: Secondary | ICD-10-CM

## 2018-03-12 DIAGNOSIS — J9611 Chronic respiratory failure with hypoxia: Secondary | ICD-10-CM

## 2018-03-12 DIAGNOSIS — J449 Chronic obstructive pulmonary disease, unspecified: Secondary | ICD-10-CM | POA: Diagnosis not present

## 2018-03-12 DIAGNOSIS — J432 Centrilobular emphysema: Secondary | ICD-10-CM | POA: Diagnosis not present

## 2018-03-12 DIAGNOSIS — I824Y3 Acute embolism and thrombosis of unspecified deep veins of proximal lower extremity, bilateral: Secondary | ICD-10-CM | POA: Diagnosis not present

## 2018-03-12 MED ORDER — APIXABAN 5 MG PO TABS: 5.0000 mg | ORAL_TABLET | Freq: Two times a day (BID) | ORAL | 3 refills | Status: DC

## 2018-03-12 NOTE — Patient Instructions (Addendum)
Take eliquis 5 mg 2x per day  CT scan chest and PET scan 05/29/18  Follow up in 3 months  Take care    Chronic Obstructive Pulmonary Disease Chronic obstructive pulmonary disease (COPD) is a long-term (chronic) lung problem. When you have COPD, it is hard for air to get in and out of your lungs. The way your lungs work will never return to normal. Usually the condition gets worse over time. There are things you can do to keep yourself as healthy as possible. Your doctor may treat your condition with:  Medicines.  Quitting smoking, if you smoke.  Rehabilitation. This may involve a team of specialists.  Oxygen.  Exercise and changes to your diet.  Lung surgery.  Comfort measures (palliative care).  Follow these instructions at home: Medicines  Take over-the-counter and prescription medicines only as told by your doctor.  Talk to your doctor before taking any cough or allergy medicines. You may need to avoid medicines that cause your lungs to be dry. Lifestyle  If you smoke, stop. Smoking makes the problem worse. If you need help quitting, ask your doctor.  Avoid being around things that make your breathing worse. This may include smoke, chemicals, and fumes.  Stay active, but remember to also rest.  Learn and use tips on how to relax.  Make sure you get enough sleep. Most adults need at least 7 hours a night.  Eat healthy foods. Eat smaller meals more often. Rest before meals. Controlled breathing  Learn and use tips on how to control your breathing as told by your doctor. Try: ? Breathing in (inhaling) through your nose for 1 second. Then, pucker your lips and breath out (exhale) through your lips for 2 seconds. ? Putting one hand on your belly (abdomen). Breathe in slowly through your nose for 1 second. Your hand on your belly should move out. Pucker your lips and breathe out slowly through your lips. Your hand on your belly should move in as you breathe out. Controlled  coughing  Learn and use controlled coughing to clear mucus from your lungs. The steps are: 1. Lean your head a little forward. 2. Breathe in deeply. 3. Try to hold your breath for 3 seconds. 4. Keep your mouth slightly open while coughing 2 times. 5. Spit any mucus out into a tissue. 6. Rest and do the steps again 1 or 2 times as needed. General instructions  Make sure you get all the shots (vaccines) that your doctor recommends. Ask your doctor about a flu shot and a pneumonia shot.  Use oxygen therapy and therapy to help improve your lungs (pulmonary rehabilitation) if told by your doctor. If you need home oxygen therapy, ask your doctor if you should buy a tool to measure your oxygen level (oximeter).  Make a COPD action plan with your doctor. This helps you know what to do if you feel worse than usual.  Manage any other conditions you have as told by your doctor.  Avoid going outside when it is very hot, cold, or humid.  Avoid people who have a sickness you can catch (contagious).  Keep all follow-up visits as told by your doctor. This is important. Contact a doctor if:  You cough up more mucus than usual.  There is a change in the color or thickness of the mucus.  It is harder to breathe than usual.  Your breathing is faster than usual.  You have trouble sleeping.  You need to use your  medicines more often than usual.  You have trouble doing your normal activities such as getting dressed or walking around the house. Get help right away if:  You have shortness of breath while resting.  You have shortness of breath that stops you from: ? Being able to talk. ? Doing normal activities.  Your chest hurts for longer than 5 minutes.  Your skin color is more blue than usual.  Your pulse oximeter shows that you have low oxygen for longer than 5 minutes.  You have a fever.  You feel too tired to breathe normally. Summary  Chronic obstructive pulmonary disease  (COPD) is a long-term lung problem.  The way your lungs work will never return to normal. Usually the condition gets worse over time. There are things you can do to keep yourself as healthy as possible.  Take over-the-counter and prescription medicines only as told by your doctor.  If you smoke, stop. Smoking makes the problem worse. This information is not intended to replace advice given to you by your health care provider. Make sure you discuss any questions you have with your health care provider. Document Released: 02/21/2008 Document Revised: 02/10/2016 Document Reviewed: 05/01/2013 Elsevier Interactive Patient Education  2017 [employer]

## 2018-03-12 NOTE — Telephone Encounter (Signed)
Dustin [name] was informed to continue nursing services for two additional weeks.

## 2018-03-12 NOTE — Progress Notes (Signed)
Pre visit review using our clinic review tool, if applicable. No additional management support is needed unless otherwise documented below in the visit note. 

## 2018-03-12 NOTE — Patient Instructions (Signed)
Continue oxygen therapy is close to 24 hours/day as possible Continue Dulera, Spiriva, theophylline Continue albuterol (inhaler or nebulizer) as needed for increased shortness of breath, chest tightness, wheezing, cough Continue blood thinner medicine for recent blood clot in legs and lung Follow-up in 3 months or sooner as needed

## 2018-03-12 NOTE — Progress Notes (Signed)
Chief Complaint  Patient presents with  . Follow-up   F/u  1. Severe COPD on 2L cont O2 today feeling better since Augmentin CT 02/26/18 abnormal + severe COPD, aspiration and c/w lung nodular appearance and rec repeat CT in 3 months  2. 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 Program for eliquis. Reviewed with pt if lung cancer + eliquis or xarelto not preferred but this is to be determined in 3 months with PET scan  -he brought in application to get Eliquis pt asst program today and filled out  3. HTN controlled on cardizem 30 and lasix   Review of Systems  Constitutional: Positive for weight loss.       Down 4 lbs   Eyes: Negative for blurred vision.  Respiratory: Negative for shortness of breath.   Cardiovascular: Negative for chest pain.  Skin: Negative for rash.  Psychiatric/Behavioral: Negative for depression. The patient is nervous/anxious.    Past Medical History:  Diagnosis Date  . Allergy   . Anxiety   . Asthma   . Chronic diastolic CHF (congestive heart failure) ([place])    a. echo 07/2013: EF 60-65%, DD, biatrial dilatation, Ao sclerosis, dilated RV, moderate pulmonary HTN, elevated CV and RA pressures; b. patient reported echo at Dr. Laurelyn Sickle office 02/2015 - his office does not have record of him being a pt there c. echo 11/2015: EF 60-65%, Grade 1 DD, mod-severe pulm pressures  . Chronic respiratory failure (HCC)    a. on 2L via nasal cannula; b. secondary to COPD  . COPD (chronic obstructive pulmonary disease) (HCC)    on 2L continuous   . Depression   . Emphysema of lung ([place])   . GERD (gastroesophageal reflux disease)   . HCAP (healthcare-associated pneumonia)    01/22/18-01/28/18   . Headache   . Hypertension   . Leg DVT (deep venous thromboembolism), acute, bilateral ([place])    02/26/18  . Personal history of tobacco use, presenting hazards to health 08/17/2015  . Pulmonary embolism ([place])    02/26/18  . Pulmonary HTN ([place])   . Tobacco abuse     Past Surgical History:  Procedure Laterality Date  . ABDOMINAL SURGERY    . ADENOIDECTOMY    . CARPAL TUNNEL RELEASE Right   . corpal tunnel Right   . ELBOW SURGERY Right    repaired tendon  . hemmorhoid N/A   . HEMORRHOID SURGERY    . NASAL SINUS SURGERY    . NASAL SINUS SURGERY     1970s  . NOSE SURGERY    . reflux surgery     1994  . ROTATOR CUFF REPAIR Right   . SHOULDER SURGERY    . TENNIS ELBOW RELEASE/NIRSCHEL PROCEDURE Right   . TONSILLECTOMY    . URETHRA SURGERY     surgery 6 times from age [age] yrs old  . URETHRA SURGERY     Family History  Problem Relation Age of Onset  . CAD Father   . Hyperlipidemia Father   . Stroke Father   . Heart disease Father   . Arthritis Father   . Hearing loss Father   . Hypertension Father   . Hypertension Mother   . Peripheral Artery Disease Mother   . Rheum arthritis Mother   . Asthma Mother   . Bipolar disorder Mother   . Depression Mother   . Malignant hypertension Mother   . Arthritis Mother   . Hearing loss Mother   .  Heart disease Mother   . Hyperlipidemia Mother   . Kidney disease Mother   . Birth defects Brother   . Heart disease Brother   . Alcohol abuse Daughter   . Arthritis Daughter   . Asthma Daughter   . Depression Daughter   . Drug abuse Daughter   . Miscarriages / [place] Daughter   . Intellectual disability Daughter   . Kidney disease Son   . Birth defects Paternal Grandmother   . Depression Paternal Grandmother   . Heart disease Paternal Grandmother   . Birth defects Sister   . Diabetes Neg Hx    Social History   Socioeconomic History  . Marital status: Legally Separated    Spouse name: Not on file  . Number of children: Not on file  . Years of education: Not on file  . Highest education level: Not on file  Occupational History  . Not on file  Social Needs  . Financial resource strain: Not on file  . Food insecurity:    Worry: Not on file    Inability: Not on file  .  Transportation needs:    Medical: Not on file    Non-medical: Not on file  Tobacco Use  . Smoking status: Former Smoker    Packs/day: 2.00    Years: 40.00    Pack years: 80.00    Types: Cigarettes    Last attempt to quit: 01/14/2018    Years since quitting: 0.1  . Smokeless tobacco: Never Used  Substance and Sexual Activity  . Alcohol use: No  . Drug use: No  . Sexual activity: Not Currently  Lifestyle  . Physical activity:    Days per week: 0 days    Minutes per session: 0 min  . Stress: Only a little  Relationships  . Social connections:    Talks on phone: Not on file    Gets together: Not on file    Attends religious service: Not on file    Active member of club or organization: Not on file    Attends meetings of clubs or organizations: Not on file    Relationship status: Not on file  . Intimate partner violence:    Fear of current or ex partner: Not on file    Emotionally abused: Not on file    Physically abused: Not on file    Forced sexual activity: Not on file  Other Topics Concern  . Not on file  Social History Narrative   On 2 L chronic home O2.  Lives at home alone    3 kids    No guns    Wears seat belt    Safe in relationship    Current Meds  Medication Sig  . ALPRAZolam (XANAX) 1 MG tablet Take 1 tablet (1 mg total) by mouth 3 (three) times daily as needed for anxiety.  . Baclofen 5 MG TABS Take 1 tablet by mouth 2 (two) times daily as needed (muscle spasms).  . Cholecalciferol (VITAMIN D) 2000 units tablet Take 2,000 Units by mouth daily.  Marland Kitchen diltiazem (CARDIZEM) 30 MG tablet Take 30 mg by mouth daily.  Marland Kitchen escitalopram (LEXAPRO) 20 MG tablet Take 20 mg by mouth at bedtime.  . furosemide (LASIX) 40 MG tablet Take 1 tablet (40 mg total) by mouth daily. In am  . gabapentin (NEURONTIN) 300 MG capsule TAKE 1 TO 2 CAPSULES BY MOUTH THREE TIMES DAILY  . magnesium oxide (MAG-OX) 400 MG tablet Take 400 mg by mouth  daily.  . mometasone-formoterol (DULERA) 200-5  MCG/ACT AERO Inhale 2 puffs into the lungs 2 (two) times daily.  . potassium chloride (K-DUR,KLOR-CON) 10 MEQ tablet Take 20 mEq by mouth daily. Repeat if second LASIX taken  . Probiotic Product (PROBIOTIC PO) Take by mouth.  . Rivaroxaban 15 & 20 MG TBPK Take as directed on package: Start with one '15mg'$  tablet by mouth twice a day with food. On Day 22, switch to one '20mg'$  tablet once a day with food.  Marland Kitchen rOPINIRole (REQUIP) 0.25 MG tablet TAKE 1 TABLET BY MOUTH TWICE DAILY FOR CRAMPS  . Spacer/Aero-Holding Chambers (OPTICHAMBER ADVANTAGE-LG MASK) MISC Use as directed with inhaler diag  j44.1  . theophylline (UNIPHYL) 400 MG 24 hr tablet Take 400 mg by mouth every morning.   . Tiotropium Bromide Monohydrate (SPIRIVA RESPIMAT) 2.5 MCG/ACT AERS Inhale into the lungs.   Allergies  Allergen Reactions  . Asa [Aspirin] Other (See Comments)    Reaction: swelling of the right side.  . Bee Venom Swelling  . Codeine Other (See Comments)    Reaction: Difficulty breathing  . Ibuprofen Other (See Comments)    Reaction: Swelling of the right side.   Recent Results (from the past 2160 hour(s))  CBC     Status: Abnormal   Collection Time: 12/26/17 11:24 AM  Result Value Ref Range   WBC 6.2 3.8 - 10.6 K/uL   RBC 4.31 (L) 4.40 - 5.90 MIL/uL   Hemoglobin 13.8 13.0 - 18.0 g/dL   HCT 41.5 40.0 - 52.0 %   MCV 96.4 80.0 - 100.0 fL   MCH 32.1 26.0 - 34.0 pg   MCHC 33.3 32.0 - 36.0 g/dL   RDW 14.1 11.5 - 14.5 %   Platelets 176 150 - 440 K/uL    Comment: Performed at [facility], [address] [place], [place] [address]  Basic metabolic panel     Status: Abnormal   Collection Time: 01/15/18 11:39 AM  Result Value Ref Range   Sodium 140 135 - 145 mmol/L   Potassium 3.9 3.5 - 5.1 mmol/L   Chloride 97 (L) 101 - 111 mmol/L   CO2 36 (H) 22 - 32 mmol/L   Glucose, Bld 93 65 - 99 mg/dL   BUN 20 6 - 20 mg/dL   Creatinine, Ser 1.15 0.61 - 1.24 mg/dL   Calcium 8.8 (L) 8.9 - 10.3 mg/dL   GFR calc non  Af Amer >60 >60 mL/min   GFR calc Af Amer >60 >60 mL/min    Comment: (NOTE) The eGFR has been calculated using the CKD EPI equation. This calculation has not been validated in all clinical situations. eGFR's persistently <60 mL/min signify possible Chronic Kidney Disease.    Anion gap 7 5 - 15    Comment: Performed at [facility], [address] [place], [place] [address]  Hepatic function panel     Status: Abnormal   Collection Time: 01/15/18 11:39 AM  Result Value Ref Range   Total Protein 6.4 (L) 6.5 - 8.1 g/dL   Albumin 3.7 3.5 - 5.0 g/dL   AST 17 15 - 41 U/L   ALT 12 (L) 17 - 63 U/L   Alkaline Phosphatase 52 38 - 126 U/L   Total Bilirubin 1.2 0.3 - 1.2 mg/dL   Bilirubin, Direct 0.2 0.1 - 0.5 mg/dL   Indirect Bilirubin 1.0 (H) 0.3 - 0.9 mg/dL    Comment: Performed at [facility], [address], [place],  [address]  CBC WITH DIFFERENTIAL     Status: Abnormal   Collection Time: 01/15/18 11:39 AM  Result Value Ref Range   WBC 14.5 (H) 3.8 - 10.6 K/uL   RBC 4.58 4.40 - 5.90 MIL/uL   Hemoglobin 14.6 13.0 - 18.0 g/dL   HCT 44.4 40.0 - 52.0 %   MCV 96.8 80.0 - 100.0 fL   MCH 31.8 26.0 - 34.0 pg   MCHC 32.9 32.0 - 36.0 g/dL   RDW 15.0 (H) 11.5 - 14.5 %   Platelets 173 150 - 440 K/uL   Neutrophils Relative % 84 %   Neutro Abs 12.1 (H) 1.4 - 6.5 K/uL   Lymphocytes Relative 8 %   Lymphs Abs 1.1 1.0 - 3.6 K/uL   Monocytes Relative 7 %   Monocytes Absolute 1.0 0.2 - 1.0 K/uL   Eosinophils Relative 1 %   Eosinophils Absolute 0.1 0 - 0.7 K/uL   Basophils Relative 0 %   Basophils Absolute 0.0 0 - 0.1 K/uL    Comment: Performed at [facility], [address] [place], [place] [address]  Brain natriuretic peptide     Status: Abnormal   Collection Time: 01/15/18 11:39 AM  Result Value Ref Range   B Natriuretic Peptide 113.0 (H) 0.0 - 100.0 pg/mL    Comment: Performed at [facility], [address] [place], [place] [address]  TSH      Status: None   Collection Time: 01/15/18 11:39 AM  Result Value Ref Range   TSH 0.701 0.350 - 4.500 uIU/mL    Comment: Performed by a 3rd Generation assay with a functional sensitivity of <=0.01 uIU/mL. Performed at [facility], [address] [place], [place] [address]   Culture, expectorated sputum-assessment     Status: None   Collection Time: 01/15/18 11:43 AM  Result Value Ref Range   Specimen Description SPUTUM    Special Requests Normal    Sputum evaluation      THIS SPECIMEN IS ACCEPTABLE FOR SPUTUM CULTURE Performed at [facility], [address] [place], [place] [address]    Report Status 01/15/2018 FINAL   Culture, respiratory (NON-Expectorated)     Status: None   Collection Time: 01/15/18 11:43 AM  Result Value Ref Range   Specimen Description      SPUTUM Performed at [facility], [address], [place], [place] [address]    Special Requests      Normal Reflexed from [id number] Performed at [facility], [place], [place] [address]    Gram Stain      ABUNDANT WBC PRESENT,BOTH PMN AND MONONUCLEAR RARE GRAM POSITIVE COCCI    Culture      FEW Consistent with normal respiratory flora. Performed at [facility], [place] [address] [place], [place] [address]    Report Status 01/18/2018 FINAL   Basic metabolic panel     Status: Abnormal   Collection Time: 01/16/18  5:33 AM  Result Value Ref Range   Sodium 140 135 - 145 mmol/L   Potassium 4.1 3.5 - 5.1 mmol/L   Chloride 98 (L) 101 - 111 mmol/L   CO2 37 (H) 22 - 32 mmol/L   Glucose, Bld 155 (H) 65 - 99 mg/dL   BUN 24 (H) 6 - 20 mg/dL   Creatinine, Ser 1.29 (H) 0.61 - 1.24 mg/dL   Calcium 8.9 8.9 - 10.3 mg/dL   GFR calc non Af Amer 57 (L) >60 mL/min   GFR  calc Af Amer >60 >60 mL/min    Comment: (NOTE) The eGFR has been calculated using the CKD EPI equation. This calculation has not been validated in all clinical situations. eGFR's persistently <60  mL/min signify possible Chronic Kidney Disease.    Anion gap 5 5 - 15    Comment: Performed at [facility], [address] [place], [place] [address]  CBC     Status: Abnormal   Collection Time: 01/16/18  5:33 AM  Result Value Ref Range   WBC 12.8 (H) 3.8 - 10.6 K/uL   RBC 4.39 (L) 4.40 - 5.90 MIL/uL   Hemoglobin 13.9 13.0 - 18.0 g/dL   HCT 42.7 40.0 - 52.0 %   MCV 97.2 80.0 - 100.0 fL   MCH 31.7 26.0 - 34.0 pg   MCHC 32.6 32.0 - 36.0 g/dL   RDW 15.2 (H) 11.5 - 14.5 %   Platelets 182 150 - 440 K/uL    Comment: Performed at [facility], [address] [place], [place] [address]  Basic metabolic panel     Status: Abnormal   Collection Time: 01/17/18  5:25 AM  Result Value Ref Range   Sodium 140 135 - 145 mmol/L   Potassium 4.1 3.5 - 5.1 mmol/L   Chloride 99 (L) 101 - 111 mmol/L   CO2 35 (H) 22 - 32 mmol/L   Glucose, Bld 128 (H) 65 - 99 mg/dL   BUN 35 (H) 6 - 20 mg/dL   Creatinine, Ser 1.06 0.61 - 1.24 mg/dL   Calcium 9.0 8.9 - 10.3 mg/dL   GFR calc non Af Amer >60 >60 mL/min   GFR calc Af Amer >60 >60 mL/min    Comment: (NOTE) The eGFR has been calculated using the CKD EPI equation. This calculation has not been validated in all clinical situations. eGFR's persistently <60 mL/min signify possible Chronic Kidney Disease.    Anion gap 6 5 - 15    Comment: Performed at [facility], [place], [place] [address]  Procalcitonin - Baseline     Status: None   Collection Time: 01/17/18  5:25 AM  Result Value Ref Range   Procalcitonin <0.10 ng/mL    Comment:        Interpretation: PCT (Procalcitonin) <= 0.5 ng/mL: Systemic infection (sepsis) is not likely. Local bacterial infection is possible. (NOTE)       Sepsis PCT Algorithm           Lower Respiratory Tract                                      Infection PCT Algorithm    ----------------------------     ----------------------------         PCT < 0.25 ng/mL                PCT  < 0.10 ng/mL         Strongly encourage             Strongly discourage   discontinuation of antibiotics    initiation of antibiotics    ----------------------------     -----------------------------       PCT 0.25 - 0.50 ng/mL            PCT 0.10 - 0.25 ng/mL               OR       >  80% decrease in PCT            Discourage initiation of                                            antibiotics      Encourage discontinuation           of antibiotics    ----------------------------     -----------------------------         PCT >= 0.50 ng/mL              PCT 0.26 - 0.50 ng/mL               AND        <80% decrease in PCT             Encourage initiation of                                             antibiotics       Encourage continuation           of antibiotics    ----------------------------     -----------------------------        PCT >= 0.50 ng/mL                  PCT > 0.50 ng/mL               AND         increase in PCT                  Strongly encourage                                      initiation of antibiotics    Strongly encourage escalation           of antibiotics                                     -----------------------------                                           PCT <= 0.25 ng/mL                                                 OR                                        > 80% decrease in PCT                                     Discontinue / Do not initiate  antibiotics Performed at [facility], [address] [place], [place] [address]   Basic metabolic panel     Status: Abnormal   Collection Time: 01/19/18  9:46 AM  Result Value Ref Range   Sodium 137 135 - 145 mmol/L   Potassium 4.9 3.5 - 5.1 mmol/L   Chloride 96 (L) 101 - 111 mmol/L   CO2 35 (H) 22 - 32 mmol/L   Glucose, Bld 159 (H) 65 - 99 mg/dL   BUN 34 (H) 6 - 20 mg/dL   Creatinine, Ser 1.16 0.61 - 1.24 mg/dL   Calcium 9.1 8.9 - 10.3 mg/dL    GFR calc non Af Amer >60 >60 mL/min   GFR calc Af Amer >60 >60 mL/min    Comment: (NOTE) The eGFR has been calculated using the CKD EPI equation. This calculation has not been validated in all clinical situations. eGFR's persistently <60 mL/min signify possible Chronic Kidney Disease.    Anion gap 6 5 - 15    Comment: Performed at [facility], [address] [place], [place] [address]  Magnesium     Status: None   Collection Time: 01/19/18  9:46 AM  Result Value Ref Range   Magnesium 2.4 1.7 - 2.4 mg/dL    Comment: Performed at [facility], [address] [place], [place] [address]  Basic metabolic panel     Status: Abnormal   Collection Time: 01/20/18  3:40 AM  Result Value Ref Range   Sodium 140 135 - 145 mmol/L   Potassium 4.3 3.5 - 5.1 mmol/L   Chloride 98 (L) 101 - 111 mmol/L   CO2 37 (H) 22 - 32 mmol/L   Glucose, Bld 99 65 - 99 mg/dL   BUN 34 (H) 6 - 20 mg/dL   Creatinine, Ser 1.05 0.61 - 1.24 mg/dL   Calcium 8.7 (L) 8.9 - 10.3 mg/dL   GFR calc non Af Amer >60 >60 mL/min   GFR calc Af Amer >60 >60 mL/min    Comment: (NOTE) The eGFR has been calculated using the CKD EPI equation. This calculation has not been validated in all clinical situations. eGFR's persistently <60 mL/min signify possible Chronic Kidney Disease.    Anion gap 5 5 - 15    Comment: Performed at [facility], [address] [place], [place] [address]  Magnesium     Status: None   Collection Time: 01/20/18  3:40 AM  Result Value Ref Range   Magnesium 2.3 1.7 - 2.4 mg/dL    Comment: Performed at [facility], [address] [place], [place] [address]  Basic metabolic panel     Status: Abnormal   Collection Time: 01/22/18  8:07 PM  Result Value Ref Range   Sodium 140 135 - 145 mmol/L   Potassium 4.6 3.5 - 5.1 mmol/L    Comment: HEMOLYSIS AT THIS LEVEL MAY AFFECT RESULT   Chloride 100 (L) 101 - 111 mmol/L   CO2 30 22 - 32 mmol/L   Glucose, Bld 179 (H) 65  - 99 mg/dL   BUN 45 (H) 6 - 20 mg/dL   Creatinine, Ser 1.33 (H) 0.61 - 1.24 mg/dL   Calcium 9.2 8.9 - 10.3 mg/dL   GFR calc non Af Amer 55 (L) >60 mL/min   GFR calc Af Amer >60 >60 mL/min    Comment: (NOTE) The eGFR has been calculated using the CKD EPI equation. This calculation has not been validated in all clinical situations. eGFR's persistently <60  mL/min signify possible Chronic Kidney Disease.    Anion gap 10 5 - 15    Comment: Performed at [facility], [address] [place], [place] [address]  CBC     Status: Abnormal   Collection Time: 01/22/18  8:07 PM  Result Value Ref Range   WBC 18.5 (H) 3.8 - 10.6 K/uL   RBC 5.23 4.40 - 5.90 MIL/uL   Hemoglobin 16.5 13.0 - 18.0 g/dL   HCT 50.6 40.0 - 52.0 %   MCV 96.7 80.0 - 100.0 fL   MCH 31.6 26.0 - 34.0 pg   MCHC 32.7 32.0 - 36.0 g/dL   RDW 15.5 (H) 11.5 - 14.5 %   Platelets 168 150 - 440 K/uL    Comment: Performed at [facility], [address] [place], [place] [address]  Troponin I     Status: Abnormal   Collection Time: 01/22/18  8:07 PM  Result Value Ref Range   Troponin I 0.03 (HH) <0.03 ng/mL    Comment: CRITICAL RESULT CALLED TO, READ BACK BY AND VERIFIED WITH REBECCA UHORCHUK ON 01/22/18 AT 2038 JAG Performed at [facility], [address] [place], [place] [address]   Blood culture (routine x 2)     Status: None   Collection Time: 01/22/18 11:56 PM  Result Value Ref Range   Specimen Description BLOOD RIGHT AC    Special Requests      BOTTLES DRAWN AEROBIC AND ANAEROBIC Blood Culture adequate volume   Culture      NO GROWTH 5 DAYS Performed at [facility], [address] [place], [place] [address]    Report Status 01/28/2018 FINAL   Blood culture (routine x 2)     Status: None   Collection Time: 01/22/18 11:56 PM  Result Value Ref Range   Specimen Description BLOOD RIGHT HAND    Special Requests      BOTTLES DRAWN AEROBIC AND ANAEROBIC Blood Culture adequate  volume   Culture      NO GROWTH 5 DAYS Performed at [facility], [address] [place], [place] [address]    Report Status 01/28/2018 FINAL   Culture, sputum-assessment     Status: None   Collection Time: 01/23/18  1:42 AM  Result Value Ref Range   Specimen Description EXPECTORATED SPUTUM    Special Requests NONE    Sputum evaluation      THIS SPECIMEN IS ACCEPTABLE FOR SPUTUM CULTURE Performed at [facility], [address], [place], [place] [address]    Report Status 01/23/2018 FINAL   Culture, respiratory (NON-Expectorated)     Status: None   Collection Time: 01/23/18  1:42 AM  Result Value Ref Range   Specimen Description      EXPECTORATED SPUTUM Performed at [facility], [address] [place], [place] [address]    Special Requests      NONE Reflexed from [id number] Performed at [facility], [address] [place], [place] [address]    Gram Stain      RARE WBC PRESENT, PREDOMINANTLY PMN FEW GRAM POSITIVE COCCI IN CLUSTERS FEW GRAM POSITIVE RODS RARE GRAM NEGATIVE RODS    Culture      Consistent with normal respiratory flora. Performed at [facility], [place] [address], [place],  [address]    Report Status 01/25/2018 FINAL   Strep pneumoniae urinary antigen     Status: None   Collection Time: 01/23/18  5:18 AM  Result Value  Ref Range   Strep Pneumo Urinary Antigen NEGATIVE NEGATIVE    Comment:        Infection due to S. pneumoniae cannot be absolutely ruled out since the antigen present may be below the detection limit of the test. Performed at [facility], [address] [place], [place] [address]   HIV antibody (Routine Screening)     Status: None   Collection Time: 01/23/18  6:07 AM  Result Value Ref Range   HIV Screen 4th Generation wRfx Non Reactive Non Reactive    Comment: (NOTE) Performed At: [facility] [address] [place], [place] [id number] Rush Farmer MD  NI:[id number] Performed at [facility], [address] [place], [place] [address]   Troponin I     Status: None   Collection Time: 01/23/18  6:07 AM  Result Value Ref Range   Troponin I <0.03 <0.03 ng/mL    Comment: Performed at [facility], [address] [place], [place] [address]  MRSA PCR Screening     Status: None   Collection Time: 01/23/18  2:52 PM  Result Value Ref Range   MRSA by PCR NEGATIVE NEGATIVE    Comment:        The GeneXpert MRSA Assay (FDA approved for NASAL specimens only), is one component of a comprehensive MRSA colonization surveillance program. It is not intended to diagnose MRSA infection nor to guide or monitor treatment for MRSA infections. Performed at [facility], [address] [place], [place] [address]   Procalcitonin - Baseline     Status: None   Collection Time: 01/23/18  3:17 PM  Result Value Ref Range   Procalcitonin 0.12 ng/mL    Comment:        Interpretation: PCT (Procalcitonin) <= 0.5 ng/mL: Systemic infection (sepsis) is not likely. Local bacterial infection is possible. (NOTE)       Sepsis PCT Algorithm           Lower Respiratory Tract                                      Infection PCT Algorithm    ----------------------------     ----------------------------         PCT < 0.25 ng/mL                PCT < 0.10 ng/mL         Strongly encourage             Strongly discourage   discontinuation of antibiotics    initiation of antibiotics    ----------------------------     -----------------------------       PCT 0.25 - 0.50 ng/mL            PCT 0.10 - 0.25 ng/mL               OR       >80% decrease in PCT            Discourage initiation of                                            antibiotics      Encourage discontinuation           of antibiotics    ----------------------------     -----------------------------  PCT >= 0.50 ng/mL              PCT 0.26 - 0.50 ng/mL               AND         <80% decrease in PCT             Encourage initiation of                                             antibiotics       Encourage continuation           of antibiotics    ----------------------------     -----------------------------        PCT >= 0.50 ng/mL                  PCT > 0.50 ng/mL               AND         increase in PCT                  Strongly encourage                                      initiation of antibiotics    Strongly encourage escalation           of antibiotics                                     -----------------------------                                           PCT <= 0.25 ng/mL                                                 OR                                        > 80% decrease in PCT                                     Discontinue / Do not initiate                                             antibiotics Performed at [facility], [address] [place], [place] [address]   Procalcitonin     Status: None   Collection Time: 01/24/18  6:12 AM  Result Value Ref Range   Procalcitonin <0.10 ng/mL    Comment:        Interpretation: PCT (Procalcitonin) <= 0.5 ng/mL: Systemic infection (sepsis) is not likely. Local bacterial infection  is possible. (NOTE)       Sepsis PCT Algorithm           Lower Respiratory Tract                                      Infection PCT Algorithm    ----------------------------     ----------------------------         PCT < 0.25 ng/mL                PCT < 0.10 ng/mL         Strongly encourage             Strongly discourage   discontinuation of antibiotics    initiation of antibiotics    ----------------------------     -----------------------------       PCT 0.25 - 0.50 ng/mL            PCT 0.10 - 0.25 ng/mL               OR       >80% decrease in PCT            Discourage initiation of                                            antibiotics      Encourage discontinuation           of antibiotics     ----------------------------     -----------------------------         PCT >= 0.50 ng/mL              PCT 0.26 - 0.50 ng/mL               AND        <80% decrease in PCT             Encourage initiation of                                             antibiotics       Encourage continuation           of antibiotics    ----------------------------     -----------------------------        PCT >= 0.50 ng/mL                  PCT > 0.50 ng/mL               AND         increase in PCT                  Strongly encourage                                      initiation of antibiotics    Strongly encourage escalation           of antibiotics                                     -----------------------------  PCT <= 0.25 ng/mL                                                 OR                                        > 80% decrease in PCT                                     Discontinue / Do not initiate                                             antibiotics Performed at [facility], [address] [place], [place] [address]   Glucose, capillary     Status: Abnormal   Collection Time: 01/24/18  6:08 PM  Result Value Ref Range   Glucose-Capillary 116 (H) 65 - 99 mg/dL  Glucose, capillary     Status: Abnormal   Collection Time: 01/24/18  8:55 PM  Result Value Ref Range   Glucose-Capillary 140 (H) 65 - 99 mg/dL   Comment 1 Notify RN    Comment 2 Document in Chart   Procalcitonin     Status: None   Collection Time: 01/25/18  5:40 AM  Result Value Ref Range   Procalcitonin <0.10 ng/mL    Comment:        Interpretation: PCT (Procalcitonin) <= 0.5 ng/mL: Systemic infection (sepsis) is not likely. Local bacterial infection is possible. (NOTE)       Sepsis PCT Algorithm           Lower Respiratory Tract                                      Infection PCT Algorithm    ----------------------------     ----------------------------         PCT <  0.25 ng/mL                PCT < 0.10 ng/mL         Strongly encourage             Strongly discourage   discontinuation of antibiotics    initiation of antibiotics    ----------------------------     -----------------------------       PCT 0.25 - 0.50 ng/mL            PCT 0.10 - 0.25 ng/mL               OR       >80% decrease in PCT            Discourage initiation of                                            antibiotics      Encourage discontinuation  of antibiotics    ----------------------------     -----------------------------         PCT >= 0.50 ng/mL              PCT 0.26 - 0.50 ng/mL               AND        <80% decrease in PCT             Encourage initiation of                                             antibiotics       Encourage continuation           of antibiotics    ----------------------------     -----------------------------        PCT >= 0.50 ng/mL                  PCT > 0.50 ng/mL               AND         increase in PCT                  Strongly encourage                                      initiation of antibiotics    Strongly encourage escalation           of antibiotics                                     -----------------------------                                           PCT <= 0.25 ng/mL                                                 OR                                        > 80% decrease in PCT                                     Discontinue / Do not initiate                                             antibiotics Performed at [facility], [address] [place],  [address]   CBC     Status: Abnormal   Collection Time: 01/25/18  5:40 AM  Result Value Ref Range   WBC 14.3 (H) 3.8 - 10.6 K/uL   RBC 4.47  4.40 - 5.90 MIL/uL   Hemoglobin 14.2 13.0 - 18.0 g/dL   HCT 43.0 40.0 - 52.0 %   MCV 96.2 80.0 - 100.0 fL   MCH 31.6 26.0 - 34.0 pg   MCHC 32.9 32.0 - 36.0 g/dL   RDW 14.6 (H) 11.5 - 14.5 %   Platelets 154 150  - 440 K/uL    Comment: Performed at [facility], [address] [place], [place] [address]  Basic metabolic panel     Status: Abnormal   Collection Time: 01/25/18  5:40 AM  Result Value Ref Range   Sodium 139 135 - 145 mmol/L   Potassium 5.0 3.5 - 5.1 mmol/L   Chloride 100 (L) 101 - 111 mmol/L   CO2 34 (H) 22 - 32 mmol/L   Glucose, Bld 116 (H) 65 - 99 mg/dL   BUN 39 (H) 6 - 20 mg/dL   Creatinine, Ser 1.03 0.61 - 1.24 mg/dL   Calcium 9.0 8.9 - 10.3 mg/dL   GFR calc non Af Amer >60 >60 mL/min   GFR calc Af Amer >60 >60 mL/min    Comment: (NOTE) The eGFR has been calculated using the CKD EPI equation. This calculation has not been validated in all clinical situations. eGFR's persistently <60 mL/min signify possible Chronic Kidney Disease.    Anion gap 5 5 - 15    Comment: Performed at [facility], [place], [place] [address]  Glucose, capillary     Status: Abnormal   Collection Time: 01/25/18  7:58 AM  Result Value Ref Range   Glucose-Capillary 126 (H) 65 - 99 mg/dL  Glucose, capillary     Status: None   Collection Time: 01/25/18 11:32 AM  Result Value Ref Range   Glucose-Capillary 82 65 - 99 mg/dL  Glucose, capillary     Status: Abnormal   Collection Time: 01/25/18  4:56 PM  Result Value Ref Range   Glucose-Capillary 109 (H) 65 - 99 mg/dL  Glucose, capillary     Status: Abnormal   Collection Time: 01/25/18  8:56 PM  Result Value Ref Range   Glucose-Capillary 144 (H) 65 - 99 mg/dL   Comment 1 Notify RN    Comment 2 Document in Chart   Glucose, capillary     Status: Abnormal   Collection Time: 01/26/18  8:20 AM  Result Value Ref Range   Glucose-Capillary 164 (H) 65 - 99 mg/dL  Glucose, capillary     Status: None   Collection Time: 01/26/18  1:07 PM  Result Value Ref Range   Glucose-Capillary 89 65 - 99 mg/dL  Glucose, capillary     Status: Abnormal   Collection Time: 01/26/18  5:06 PM  Result Value Ref Range   Glucose-Capillary 142  (H) 65 - 99 mg/dL  Glucose, capillary     Status: Abnormal   Collection Time: 01/26/18  9:12 PM  Result Value Ref Range   Glucose-Capillary 103 (H) 65 - 99 mg/dL  Glucose, capillary     Status: Abnormal   Collection Time: 01/27/18  7:52 AM  Result Value Ref Range   Glucose-Capillary 113 (H) 65 - 99 mg/dL  Glucose, capillary     Status: None   Collection Time: 01/27/18 12:05 PM  Result Value Ref Range   Glucose-Capillary 94 65 - 99 mg/dL  Glucose, capillary     Status: Abnormal   Collection Time: 01/27/18  4:39 PM  Result Value Ref Range   Glucose-Capillary 113 (H) 65 - 99  mg/dL  Glucose, capillary     Status: Abnormal   Collection Time: 01/27/18  9:06 PM  Result Value Ref Range   Glucose-Capillary 134 (H) 65 - 99 mg/dL  Glucose, capillary     Status: Abnormal   Collection Time: 01/28/18  7:37 AM  Result Value Ref Range   Glucose-Capillary 131 (H) 65 - 99 mg/dL  Glucose, capillary     Status: None   Collection Time: 01/28/18 11:20 AM  Result Value Ref Range   Glucose-Capillary 80 65 - 99 mg/dL  Glucose, capillary     Status: Abnormal   Collection Time: 01/28/18  4:30 PM  Result Value Ref Range   Glucose-Capillary 115 (H) 65 - 99 mg/dL  CBC     Status: Abnormal   Collection Time: 02/26/18  7:13 PM  Result Value Ref Range   WBC 8.6 3.8 - 10.6 K/uL   RBC 3.82 (L) 4.40 - 5.90 MIL/uL   Hemoglobin 12.6 (L) 13.0 - 18.0 g/dL   HCT 37.4 (L) 40.0 - 52.0 %   MCV 97.9 80.0 - 100.0 fL   MCH 33.0 26.0 - 34.0 pg   MCHC 33.8 32.0 - 36.0 g/dL   RDW 15.3 (H) 11.5 - 14.5 %   Platelets 241 150 - 440 K/uL    Comment: Performed at [facility], [address] [place], [place] [address]  Comprehensive metabolic panel     Status: Abnormal   Collection Time: 02/26/18  7:13 PM  Result Value Ref Range   Sodium 143 135 - 145 mmol/L   Potassium 3.9 3.5 - 5.1 mmol/L   Chloride 98 (L) 101 - 111 mmol/L   CO2 35 (H) 22 - 32 mmol/L   Glucose, Bld 86 65 - 99 mg/dL   BUN 15 6 - 20 mg/dL    Creatinine, Ser 1.22 0.61 - 1.24 mg/dL   Calcium 9.3 8.9 - 10.3 mg/dL   Total Protein 6.7 6.5 - 8.1 g/dL   Albumin 3.4 (L) 3.5 - 5.0 g/dL   AST 17 15 - 41 U/L   ALT 11 (L) 17 - 63 U/L   Alkaline Phosphatase 58 38 - 126 U/L   Total Bilirubin 0.6 0.3 - 1.2 mg/dL   GFR calc non Af Amer >60 >60 mL/min   GFR calc Af Amer >60 >60 mL/min    Comment: (NOTE) The eGFR has been calculated using the CKD EPI equation. This calculation has not been validated in all clinical situations. eGFR's persistently <60 mL/min signify possible Chronic Kidney Disease.    Anion gap 10 5 - 15    Comment: Performed at [facility], [address] [place], [place] [address]  Troponin I     Status: Abnormal   Collection Time: 02/26/18  7:13 PM  Result Value Ref Range   Troponin I 0.03 (HH) <0.03 ng/mL    Comment: CRITICAL RESULT CALLED TO, READ BACK BY AND VERIFIED WITH LISA THOMPSON AT 2024 02/26/2018.  TFK Performed at [facility], [address] [place], [place] [address]   Brain natriuretic peptide     Status: Abnormal   Collection Time: 02/26/18  8:44 PM  Result Value Ref Range   B Natriuretic Peptide 158.0 (H) 0.0 - 100.0 pg/mL    Comment: Performed at [facility], [address] [place], [place] [address]   Objective  Body mass index is 31.11 kg/m. Wt Readings from Last 3 Encounters:  03/12/18 216 lb 12.8 oz (98.3 kg)  03/12/18 212  lb (96.2 kg)  02/28/18 215 lb (97.5 kg)   Temp Readings from Last 3 Encounters:  03/12/18 98.5 F (36.9 C) (Oral)  02/28/18 98.1 F (36.7 C) (Oral)  02/26/18 98.1 F (36.7 C) (Oral)   BP Readings from Last 3 Encounters:  03/12/18 100/60  03/12/18 126/80  03/11/18 (!) 90/52   Pulse Readings from Last 3 Encounters:  03/12/18 (!) 101  03/12/18 (!) 116  03/11/18 97    Physical Exam  Constitutional: He is oriented to person, place, and time. Vital signs are normal. He appears well-developed and well-nourished. He is  cooperative.  HENT:  Head: Normocephalic and atraumatic.  Mouth/Throat: Oropharynx is clear and moist and mucous membranes are normal.  Eyes: Pupils are equal, round, and reactive to light. Conjunctivae are normal.  Cardiovascular: Regular rhythm and normal heart sounds. Tachycardia present.  Pulmonary/Chest: Effort normal. He has decreased breath sounds in the left upper field, the left middle field and the left lower field. He has wheezes.  Neurological: He is alert and oriented to person, place, and time. Gait normal.  Skin: Skin is warm, dry and intact.  Psychiatric: He has a normal mood and affect. His speech is normal and behavior is normal. Judgment and thought content normal. Cognition and memory are normal.  Nursing note and vitals reviewed.   Assessment   1. Severe COPD on 2L cont., abnormal CT chest 02/26/18, aspiration pneumonia and lung nodules  2. DVT/PE  3. HTN 4. HM Plan  1.  Will repeat CT chest in 3 months with PET scan to further w/u and r/o lung caner 05/29/18  Cont meds per lung MD  Congratulated on no smoking for now   2. Filled out application for eliquis today. LMWH I.e lovenox maybe preferred if malignancy 3. Cont meds  4.  Had flu shot 07/03/17  Tdap, prevnar check prior PCP records  pna 23 had 01/16/18  Consider shingrix in future   Need to check lipid in future, TSH, hep B/C, MMR, PSA  Get records PCP Dr. Clayborn Bigness including stool tests had in the past  never had colonoscopy  Smoker age 61 to 23 max 2 ppd quit 12/2017 no FH   Provider: Dr. Olivia Mackie McLean-Scocuzza-Internal Medicine

## 2018-03-13 NOTE — Progress Notes (Signed)
PULMONARY OFFICE FOLLOW-UP NOTE  Requesting MD/Service: Self Date of initial consultation: 12/18/17 Reason for consultation: severe COPD, recent intubation for AECOPD  PT PROFILE: 62 y.o. male smoker (up to 2 PPD previously X 40+ yrs, now occasional, none since recent hospitalization) hospitalized 03/07-03/14/19 with ventilator dependent respiratory failure due to AECOPD, PNA. Respiratory virus panel was negative for influenza and positive for rhinovirus. Intubated 03/07 and extubated 03/10. Has prior history of O2 dependent COPD (baseline 2 LPM [place]).   DATA/EVENTS: 06/11/17 CT chest: very severe emphysema. Nonspecific RUL irregular opacity. 9 mm right lower lobe nodule is stable dating back to 11/30/2015 11/23/17 Echocardiogram: LVEF 50-55%, grade I diastolic dysfunction. Mild RV hypokinesis of the apex, RVSP est 45 mmHg 01/22/18 CT chest: Very severe emphysema. New right lower lobe pneumonia. Probable left lower lobe airspace disease is well.  02/26/18 ED visit: CC of LE edema. CTA chest revealed possible PE. LE venous US revealed B DVT 02/26/18 LE venous US: Deep venous thrombosis noted in the right popliteal vein, right posterior tibial vein and left gastroc veins 02/26/18 CTA chest: Findings suspicious for subsegmental pulmonary emboli in the lingula and left lower lobe, motion artifact also considered but felt less likely. Advanced emphysema. Progression in consolidative and nodular opacities in both lower lobes since exam last month. This may be infectious or inflammatory. Given presence of debris in the bronchi, aspiration is considered. Given the nodular appearance, neoplastic process is not excluded, recommend follow-up chest CT in 3 months   INTERVAL: ED visit 06/11 as documented above. New dx of DVT and probable PE  SUBJ:  He is now on rivaroxaban.  This is a routine follow-up scheduled last visit.  However, the events culminating in a diagnosis of DVT and PE as documented above are  noted.  Presently, he has no new complaints.  He remains dependent on oxygen.  He continues to have very severe exertional dyspnea.  He denies fever, chest pain, purulent sputum, hemoptysis.  He has bilateral ankle and pedal edema.  This has not progressed since his diagnosis of DVT 2 weeks ago.   Vitals:   03/12/18 0841 03/12/18 0844  BP:  126/80  Pulse:  (!) 116  SpO2:  92%  Weight: 212 lb (96.2 kg)   Height: 5\' 10"  (1.778 m)   3.5 LPM [place]  EXAM:  Gen: In WC, no overt distress HEENT: NCAT, sclerae white Neck: No palpable LN, JVP not visualized Lungs: BS markedly diminished, scattered wheezes Cardiovascular: Regular, no M Abdomen: Obese, soft, NT, NABS Ext: 3+ symmetric ankle and pedal edema Neuro: No focal deficits on limited exam  DATA:   BMP Latest Ref Rng & Units 02/26/2018 01/25/2018 01/22/2018  Glucose 65 - 99 mg/dL 86 308(M116(H) 578(I179(H)  BUN 6 - 20 mg/dL 15 69(G39(H) 29(B45(H)  Creatinine 0.61 - 1.24 mg/dL 2.841.22 1.321.03 4.40(N1.33(H)  Sodium 135 - 145 mmol/L 143 139 140  Potassium 3.5 - 5.1 mmol/L 3.9 5.0 4.6  Chloride 101 - 111 mmol/L 98(L) 100(L) 100(L)  CO2 22 - 32 mmol/L 35(H) 34(H) 30  Calcium 8.9 - 10.3 mg/dL 9.3 9.0 9.2    CBC Latest Ref Rng & Units 02/26/2018 01/25/2018 01/22/2018  WBC 3.8 - 10.6 K/uL 8.6 14.3(H) 18.5(H)  Hemoglobin 13.0 - 18.0 g/dL 12.6(L) 14.2 16.5  Hematocrit 40.0 - 52.0 % 37.4(L) 43.0 50.6  Platelets 150 - 440 K/uL 241 154 168    CXR 06/11: Chronic bibasilar interstitial prominence.  No acute findings c   I have personally reviewed  all chest radiographs reported above including CXRs and CT chest unless otherwise indicated  IMPRESSION:     ICD-10-CM   1. Chronic hypoxemic respiratory failure (HCC) J96.11   2. COPD, very severe (HCC) J44.9   3. Recent pulmonary embolism I26.99     PLAN:  Continue oxygen therapy is close to 24 hours/day as possible Continue Dulera, Spiriva, theophylline Continue albuterol (inhaler or nebulizer) as needed for increased  shortness of breath, chest tightness, wheezing, cough Continue blood thinner medicine for recent blood clot in legs and lung  Due to cost reasons, he will change from rivaroxaban to apixaban A repeat CT chest to be performed in 3 months has been ordered Follow-up in 3 months or sooner as needed  Billy Fischer, MD PCCM service Mobile [phone] Pager [phone] 03/13/2018 11:04 AM

## 2018-03-14 DIAGNOSIS — Z9981 Dependence on supplemental oxygen: Secondary | ICD-10-CM | POA: Diagnosis not present

## 2018-03-14 DIAGNOSIS — Z7951 Long term (current) use of inhaled steroids: Secondary | ICD-10-CM | POA: Diagnosis not present

## 2018-03-14 DIAGNOSIS — Z7952 Long term (current) use of systemic steroids: Secondary | ICD-10-CM | POA: Diagnosis not present

## 2018-03-14 DIAGNOSIS — J441 Chronic obstructive pulmonary disease with (acute) exacerbation: Secondary | ICD-10-CM | POA: Diagnosis not present

## 2018-03-14 DIAGNOSIS — G629 Polyneuropathy, unspecified: Secondary | ICD-10-CM | POA: Diagnosis not present

## 2018-03-14 DIAGNOSIS — I5033 Acute on chronic diastolic (congestive) heart failure: Secondary | ICD-10-CM | POA: Diagnosis not present

## 2018-03-14 DIAGNOSIS — K219 Gastro-esophageal reflux disease without esophagitis: Secondary | ICD-10-CM | POA: Diagnosis not present

## 2018-03-14 DIAGNOSIS — J45909 Unspecified asthma, uncomplicated: Secondary | ICD-10-CM | POA: Diagnosis not present

## 2018-03-14 DIAGNOSIS — I110 Hypertensive heart disease with heart failure: Secondary | ICD-10-CM | POA: Diagnosis not present

## 2018-03-14 DIAGNOSIS — J9611 Chronic respiratory failure with hypoxia: Secondary | ICD-10-CM | POA: Diagnosis not present

## 2018-03-15 ENCOUNTER — Other Ambulatory Visit: Payer: Self-pay

## 2018-03-15 NOTE — Patient Outreach (Signed)
[name] Care Management  03/15/2018  Dustin [name] September 19, 1955 [id number]   BSW called [name], since he is now settled in his new apartment, to discuss a referral to [employer].  [name] reported that a referral was already made and he received a call that he was placed on the wait list.  BSW is closing case due to no other social work needs being identified at this time.   Malachy ChamberAmber Sophie Quiles, BSW Social Worker [phone]

## 2018-03-18 ENCOUNTER — Ambulatory Visit: Payer: Medicare Other | Admitting: Pulmonary Disease

## 2018-03-18 ENCOUNTER — Telehealth: Payer: Self-pay | Admitting: Internal Medicine

## 2018-03-18 DIAGNOSIS — J449 Chronic obstructive pulmonary disease, unspecified: Secondary | ICD-10-CM | POA: Diagnosis not present

## 2018-03-18 NOTE — Telephone Encounter (Signed)
Please advise 

## 2018-03-18 NOTE — Telephone Encounter (Signed)
Copied from CRM [phone]. Topic: Quick Communication - See Telephone Encounter >> Mar 18, 2018 12:13 PM Cipriano BunkerLambe, Annette S wrote: CRM for notification. See Telephone encounter for: 03/18/18.  Pt calling about the PET scan he was to have.  Has not heard anything.  Please call to schedule

## 2018-03-19 ENCOUNTER — Ambulatory Visit: Payer: Medicare Other | Admitting: Pulmonary Disease

## 2018-03-19 ENCOUNTER — Other Ambulatory Visit: Payer: Self-pay | Admitting: Pharmacy Technician

## 2018-03-19 ENCOUNTER — Encounter: Payer: Self-pay | Admitting: Pharmacy Technician

## 2018-03-19 NOTE — Patient Outreach (Signed)
[name]) Care Management  03/19/2018  Hershal CoriaDennis R Robillard [birth year] [id number]   Successful outreach to patient, HIPAA identifiers verified. [name] states that he doesn't remember receiving the [employer] patient assistance application. Informed him I would send him out another one. Informed him that I received his application for Eliquis however, I need him to send Proof of Income as well as a OOP spending report. He stated that he would get that infor.  Will follow up with patient in 5-7 business days to verify application has been received.  [name], CPhT [name] Care Management [phone]

## 2018-03-21 DIAGNOSIS — J45909 Unspecified asthma, uncomplicated: Secondary | ICD-10-CM | POA: Diagnosis not present

## 2018-03-21 DIAGNOSIS — I110 Hypertensive heart disease with heart failure: Secondary | ICD-10-CM | POA: Diagnosis not present

## 2018-03-21 DIAGNOSIS — Z7951 Long term (current) use of inhaled steroids: Secondary | ICD-10-CM | POA: Diagnosis not present

## 2018-03-21 DIAGNOSIS — I5033 Acute on chronic diastolic (congestive) heart failure: Secondary | ICD-10-CM | POA: Diagnosis not present

## 2018-03-21 DIAGNOSIS — Z7952 Long term (current) use of systemic steroids: Secondary | ICD-10-CM | POA: Diagnosis not present

## 2018-03-21 DIAGNOSIS — G629 Polyneuropathy, unspecified: Secondary | ICD-10-CM | POA: Diagnosis not present

## 2018-03-21 DIAGNOSIS — Z9981 Dependence on supplemental oxygen: Secondary | ICD-10-CM | POA: Diagnosis not present

## 2018-03-21 DIAGNOSIS — J9611 Chronic respiratory failure with hypoxia: Secondary | ICD-10-CM | POA: Diagnosis not present

## 2018-03-21 DIAGNOSIS — J441 Chronic obstructive pulmonary disease with (acute) exacerbation: Secondary | ICD-10-CM | POA: Diagnosis not present

## 2018-03-21 DIAGNOSIS — K219 Gastro-esophageal reflux disease without esophagitis: Secondary | ICD-10-CM | POA: Diagnosis not present

## 2018-03-22 ENCOUNTER — Other Ambulatory Visit: Payer: Self-pay | Admitting: *Deleted

## 2018-03-22 ENCOUNTER — Telehealth: Payer: Self-pay | Admitting: Internal Medicine

## 2018-03-22 DIAGNOSIS — I2699 Other pulmonary embolism without acute cor pulmonale: Secondary | ICD-10-CM

## 2018-03-22 DIAGNOSIS — I824Y3 Acute embolism and thrombosis of unspecified deep veins of proximal lower extremity, bilateral: Secondary | ICD-10-CM

## 2018-03-22 MED ORDER — APIXABAN 5 MG PO TABS: 5.0000 mg | ORAL_TABLET | Freq: Two times a day (BID) | ORAL | 3 refills | Status: DC

## 2018-03-22 NOTE — Patient Outreach (Signed)
[name]) Care Management  03/22/2018  Dustin [name] [birth year] [id number]  I reached out to [name] today to check on his general progress and management of COPD. He says he is doing much better since moving into his new apartment and feels the decrease in stress level is a major contributor to his progress.   I notified [name] that I will be out of office next week and explained to him how to make contact with my coverage partner or his primary care provider or pulmonologist should have new or worsened symptoms of any case management needs.   Plan: I will follow up with [name] by phone the week of 04/01/18.    Marja Kayslisa Noha Karasik MHA,BSN,RN,CCM [facility] Care [facility]  [phone]

## 2018-03-22 NOTE — Telephone Encounter (Signed)
Copied from CRM [phone]#[id number]. Topic: Quick Communication - Rx Refill/Question >> Mar 22, 2018  8:45 AM Luanna Coleawoud, Jessica L wrote: Medication:apixaban (ELIQUIS) 5 MG TABS tablet [[id number]][[id number]] pt called and stated that the scrip is not at the pharmacy. Prescription states printed but was not printed per pt    Has the patient contacted their pharmacy? Yes  Preferred Pharmacy (with phone number or street name):[id number] - [name] - [address] OF [phone] (Phone) [phone] (Fax)  Agent: Please be advised that RX refills may take up to 3 business days. We ask that you follow-up with your pharmacy.

## 2018-03-22 NOTE — Telephone Encounter (Signed)
Rx sent to pharmacy for patient- verified with pharmacy they have received.

## 2018-03-25 ENCOUNTER — Telehealth: Payer: Self-pay | Admitting: Internal Medicine

## 2018-03-25 NOTE — Telephone Encounter (Unsigned)
Copied from CRM [phone]. Topic: Quick Communication - See Telephone Encounter >> Mar 25, 2018  9:36 AM Raquel SarnaHayes, Teresa G wrote: Morrie SheldonAshley, RN w/ [facility] - [phone]  Verbal Orders: 1 week for 9 week for skilled nursing and home health aid 2 PRN visits

## 2018-03-25 NOTE — Telephone Encounter (Signed)
Verbal order given as written below. 

## 2018-03-27 ENCOUNTER — Other Ambulatory Visit: Payer: Self-pay | Admitting: Pharmacy Technician

## 2018-03-27 DIAGNOSIS — Z9981 Dependence on supplemental oxygen: Secondary | ICD-10-CM | POA: Diagnosis not present

## 2018-03-27 DIAGNOSIS — I110 Hypertensive heart disease with heart failure: Secondary | ICD-10-CM | POA: Diagnosis not present

## 2018-03-27 DIAGNOSIS — K219 Gastro-esophageal reflux disease without esophagitis: Secondary | ICD-10-CM | POA: Diagnosis not present

## 2018-03-27 DIAGNOSIS — I5033 Acute on chronic diastolic (congestive) heart failure: Secondary | ICD-10-CM | POA: Diagnosis not present

## 2018-03-27 DIAGNOSIS — J441 Chronic obstructive pulmonary disease with (acute) exacerbation: Secondary | ICD-10-CM | POA: Diagnosis not present

## 2018-03-27 DIAGNOSIS — Z7951 Long term (current) use of inhaled steroids: Secondary | ICD-10-CM | POA: Diagnosis not present

## 2018-03-27 DIAGNOSIS — G629 Polyneuropathy, unspecified: Secondary | ICD-10-CM | POA: Diagnosis not present

## 2018-03-27 DIAGNOSIS — Z7952 Long term (current) use of systemic steroids: Secondary | ICD-10-CM | POA: Diagnosis not present

## 2018-03-27 DIAGNOSIS — J45909 Unspecified asthma, uncomplicated: Secondary | ICD-10-CM | POA: Diagnosis not present

## 2018-03-27 DIAGNOSIS — J9611 Chronic respiratory failure with hypoxia: Secondary | ICD-10-CM | POA: Diagnosis not present

## 2018-03-27 NOTE — Patient Outreach (Signed)
[name] Care Management  03/27/2018  Hershal CoriaDennis R Burr 05/13/1956 [id number]   Successful outreach call to patient, HIPAA identifiers verified. [name] states that he received the [employer] patient assistance application in the mail yesterday. He stated he will send a copy of his proof of income and an OOP report so that I can submit his Eliquis application as well. He expects to mail it out by the end of the week.  Will follow up with patient in the next 10-14 business days if application has not been received.  [name], CPhT [name] Care Management [phone]

## 2018-03-29 DIAGNOSIS — J9611 Chronic respiratory failure with hypoxia: Secondary | ICD-10-CM | POA: Diagnosis not present

## 2018-03-29 DIAGNOSIS — Z7952 Long term (current) use of systemic steroids: Secondary | ICD-10-CM | POA: Diagnosis not present

## 2018-03-29 DIAGNOSIS — Z7951 Long term (current) use of inhaled steroids: Secondary | ICD-10-CM | POA: Diagnosis not present

## 2018-03-29 DIAGNOSIS — I5033 Acute on chronic diastolic (congestive) heart failure: Secondary | ICD-10-CM | POA: Diagnosis not present

## 2018-03-29 DIAGNOSIS — Z9981 Dependence on supplemental oxygen: Secondary | ICD-10-CM | POA: Diagnosis not present

## 2018-03-29 DIAGNOSIS — I110 Hypertensive heart disease with heart failure: Secondary | ICD-10-CM | POA: Diagnosis not present

## 2018-03-29 DIAGNOSIS — J441 Chronic obstructive pulmonary disease with (acute) exacerbation: Secondary | ICD-10-CM | POA: Diagnosis not present

## 2018-03-29 DIAGNOSIS — G629 Polyneuropathy, unspecified: Secondary | ICD-10-CM | POA: Diagnosis not present

## 2018-03-29 DIAGNOSIS — J45909 Unspecified asthma, uncomplicated: Secondary | ICD-10-CM | POA: Diagnosis not present

## 2018-03-29 DIAGNOSIS — K219 Gastro-esophageal reflux disease without esophagitis: Secondary | ICD-10-CM | POA: Diagnosis not present

## 2018-04-02 DIAGNOSIS — Z7951 Long term (current) use of inhaled steroids: Secondary | ICD-10-CM | POA: Diagnosis not present

## 2018-04-02 DIAGNOSIS — I5033 Acute on chronic diastolic (congestive) heart failure: Secondary | ICD-10-CM | POA: Diagnosis not present

## 2018-04-02 DIAGNOSIS — K219 Gastro-esophageal reflux disease without esophagitis: Secondary | ICD-10-CM | POA: Diagnosis not present

## 2018-04-02 DIAGNOSIS — J9611 Chronic respiratory failure with hypoxia: Secondary | ICD-10-CM | POA: Diagnosis not present

## 2018-04-02 DIAGNOSIS — Z7952 Long term (current) use of systemic steroids: Secondary | ICD-10-CM | POA: Diagnosis not present

## 2018-04-02 DIAGNOSIS — Z9981 Dependence on supplemental oxygen: Secondary | ICD-10-CM | POA: Diagnosis not present

## 2018-04-02 DIAGNOSIS — J441 Chronic obstructive pulmonary disease with (acute) exacerbation: Secondary | ICD-10-CM | POA: Diagnosis not present

## 2018-04-02 DIAGNOSIS — J45909 Unspecified asthma, uncomplicated: Secondary | ICD-10-CM | POA: Diagnosis not present

## 2018-04-02 DIAGNOSIS — I110 Hypertensive heart disease with heart failure: Secondary | ICD-10-CM | POA: Diagnosis not present

## 2018-04-02 DIAGNOSIS — G629 Polyneuropathy, unspecified: Secondary | ICD-10-CM | POA: Diagnosis not present

## 2018-04-05 ENCOUNTER — Other Ambulatory Visit: Payer: Self-pay | Admitting: *Deleted

## 2018-04-05 DIAGNOSIS — Z7952 Long term (current) use of systemic steroids: Secondary | ICD-10-CM | POA: Diagnosis not present

## 2018-04-05 DIAGNOSIS — I5033 Acute on chronic diastolic (congestive) heart failure: Secondary | ICD-10-CM | POA: Diagnosis not present

## 2018-04-05 DIAGNOSIS — J45909 Unspecified asthma, uncomplicated: Secondary | ICD-10-CM | POA: Diagnosis not present

## 2018-04-05 DIAGNOSIS — G629 Polyneuropathy, unspecified: Secondary | ICD-10-CM | POA: Diagnosis not present

## 2018-04-05 DIAGNOSIS — J441 Chronic obstructive pulmonary disease with (acute) exacerbation: Secondary | ICD-10-CM | POA: Diagnosis not present

## 2018-04-05 DIAGNOSIS — K219 Gastro-esophageal reflux disease without esophagitis: Secondary | ICD-10-CM | POA: Diagnosis not present

## 2018-04-05 DIAGNOSIS — J9611 Chronic respiratory failure with hypoxia: Secondary | ICD-10-CM | POA: Diagnosis not present

## 2018-04-05 DIAGNOSIS — Z7951 Long term (current) use of inhaled steroids: Secondary | ICD-10-CM | POA: Diagnosis not present

## 2018-04-05 DIAGNOSIS — I110 Hypertensive heart disease with heart failure: Secondary | ICD-10-CM | POA: Diagnosis not present

## 2018-04-05 DIAGNOSIS — Z9981 Dependence on supplemental oxygen: Secondary | ICD-10-CM | POA: Diagnosis not present

## 2018-04-05 NOTE — Patient Outreach (Signed)
[facility]) Care Management  04/05/2018  SEIBERT KEETER Jul 11, 1956 [id number]  Dustin [name] is an 62 y.o.gentleman with past medical history which includes COPD (Gold IV Classificaton), asthma, hypertension, anxiety and depression, and GERD who has had4inpatient hospital admissions in the last 6 months, all for respiratory ailments:   09/18/17-09/24/17: COPD exacerbation with HCAP 11/22/17-11/29/17: COPD exacerbation/respiratory failure with intubation (Pulmonolgy f/u appointments 12/18/17 & 01/15/18) 01/15/18- 01/21/18:COPD exacerbationand acute/chronic CHF 5/719-01/28/18: COPD exacerbation 02/26/18- ED visit from PCP office (DVT)   I reached out to [name] today to follow up on his COPD Management and new living situation.   COPD Management- Today, [name] reports that his breathing is "about the same". He denies experiencing worsened shortness of breath or cough and states he is avoiding going outside in the extreme heat.   DVT/PE- [name] he has no swelling of bilateral lower extremities and denies any symptoms related to his recent DVT. He is taking Xarelto as prescribed.   Medication Management - [name] is being helped by our pharmacy team with Eliquis patient assistance. He received forms in the mail and our team is giving guidance re: completion and submission.    Home Health Follow Up- Mr. Tennis is being followd by [facility] for home health nursing services at his new address in [place].   Psychosocial- [name] is moved in to his new apartment in [place] and is very pleased about having privacy and his own space.    Plan: I will continue to follow [name] for COPD management. I will reach out to [name] in 2 weeks. If he remains stable, we will discuss transition to a health coaching program.   [facility] CM Care Plan Problem One     Most Recent Value  Care Plan Problem One  Knowledge Deficits related  to changes in pulmonary plan of care  Role Documenting the Problem One  Care Management Hadar for Problem One  Active  THN Long Term Goal   Over the next 31 days, patient will verbalize understanding of long term plan for mangaement of COPD  [facility]N Long Term Goal Start Date  04/05/18  Interventions for Problem One Long Term Goal  reviewed current status and long term plan for mangaement of COPD  [facility] CM Short Term Goal #1   Over the next 30 days, patient will report new or worsened symptoms to pulmonary or prmary care or 911 as appropriate  [facility] CM Short Term Goal #1 Start Date  04/05/18  Interventions for Short Term Goal #1  reviewed signs and symptoms of worsening pulmonary status and when to call provider for help  [facility] CM Short Term Goal #2   Over the next 30 days, patient will take all medications including inhalers as prescribed   [facility]N CM Short Term Goal #2 Start Date  04/05/18  Interventions for Short Term Goal #2  medications reviewed  [facility] CM Short Term Goal #3  Over the next 7 days, patient will work with pharmacy team to ascertaiin medication assistance options  [facility] CM Short Term Goal #3 Start Date  02/27/18  [facility] CM Short Term Goal #3 Met Date  04/05/18  Interventions for Short Tern Goal #3  reviewed status of medication assistance process  [facility]N CM Short Term Goal #4  Over the next 30 days, patient will attend all scheduled provider appointments  [facility] CM Short Term Goal #4 Start Date  02/27/18  [facility] CM Short  Term Goal #4 Met Date  04/05/18  Interventions for Short Term Goal #4  reviewed provider appointment schedule      [facility] Care Management  [phone]

## 2018-04-11 ENCOUNTER — Other Ambulatory Visit: Payer: Self-pay | Admitting: Internal Medicine

## 2018-04-11 ENCOUNTER — Telehealth: Payer: Self-pay | Admitting: Internal Medicine

## 2018-04-11 DIAGNOSIS — J9611 Chronic respiratory failure with hypoxia: Secondary | ICD-10-CM | POA: Diagnosis not present

## 2018-04-11 DIAGNOSIS — I110 Hypertensive heart disease with heart failure: Secondary | ICD-10-CM | POA: Diagnosis not present

## 2018-04-11 DIAGNOSIS — K219 Gastro-esophageal reflux disease without esophagitis: Secondary | ICD-10-CM | POA: Diagnosis not present

## 2018-04-11 DIAGNOSIS — Z7951 Long term (current) use of inhaled steroids: Secondary | ICD-10-CM | POA: Diagnosis not present

## 2018-04-11 DIAGNOSIS — H6120 Impacted cerumen, unspecified ear: Secondary | ICD-10-CM

## 2018-04-11 DIAGNOSIS — J441 Chronic obstructive pulmonary disease with (acute) exacerbation: Secondary | ICD-10-CM | POA: Diagnosis not present

## 2018-04-11 DIAGNOSIS — I5033 Acute on chronic diastolic (congestive) heart failure: Secondary | ICD-10-CM | POA: Diagnosis not present

## 2018-04-11 DIAGNOSIS — J45909 Unspecified asthma, uncomplicated: Secondary | ICD-10-CM | POA: Diagnosis not present

## 2018-04-11 DIAGNOSIS — Z9981 Dependence on supplemental oxygen: Secondary | ICD-10-CM | POA: Diagnosis not present

## 2018-04-11 DIAGNOSIS — G629 Polyneuropathy, unspecified: Secondary | ICD-10-CM | POA: Diagnosis not present

## 2018-04-11 DIAGNOSIS — Z7952 Long term (current) use of systemic steroids: Secondary | ICD-10-CM | POA: Diagnosis not present

## 2018-04-11 MED ORDER — CARBAMIDE PEROXIDE 6.5 % OT SOLN: 5.0000 [drp] | Freq: Two times a day (BID) | OTIC | 2 refills | Status: DC

## 2018-04-11 NOTE — Telephone Encounter (Signed)
Copied from CRM [id number]. Topic: Quick Communication - Rx Refill/Question >> Apr 11, 2018 12:07 PM Alexander BergeronBarksdale, Harvey B wrote: Medication: Debrox  [facility] called to get the medication above for the pt, contact [facility] @ [phone] ask for [facility]  [facility]

## 2018-04-11 NOTE — Telephone Encounter (Signed)
Debrox is OTC but sent Rx not sure if they will cover I.e insurance call back   TMS

## 2018-04-12 DIAGNOSIS — G629 Polyneuropathy, unspecified: Secondary | ICD-10-CM | POA: Diagnosis not present

## 2018-04-12 DIAGNOSIS — Z7952 Long term (current) use of systemic steroids: Secondary | ICD-10-CM | POA: Diagnosis not present

## 2018-04-12 DIAGNOSIS — Z7951 Long term (current) use of inhaled steroids: Secondary | ICD-10-CM | POA: Diagnosis not present

## 2018-04-12 DIAGNOSIS — I110 Hypertensive heart disease with heart failure: Secondary | ICD-10-CM | POA: Diagnosis not present

## 2018-04-12 DIAGNOSIS — J9611 Chronic respiratory failure with hypoxia: Secondary | ICD-10-CM | POA: Diagnosis not present

## 2018-04-12 DIAGNOSIS — K219 Gastro-esophageal reflux disease without esophagitis: Secondary | ICD-10-CM | POA: Diagnosis not present

## 2018-04-12 DIAGNOSIS — I5033 Acute on chronic diastolic (congestive) heart failure: Secondary | ICD-10-CM | POA: Diagnosis not present

## 2018-04-12 DIAGNOSIS — J441 Chronic obstructive pulmonary disease with (acute) exacerbation: Secondary | ICD-10-CM | POA: Diagnosis not present

## 2018-04-12 DIAGNOSIS — Z9981 Dependence on supplemental oxygen: Secondary | ICD-10-CM | POA: Diagnosis not present

## 2018-04-12 DIAGNOSIS — J45909 Unspecified asthma, uncomplicated: Secondary | ICD-10-CM | POA: Diagnosis not present

## 2018-04-15 ENCOUNTER — Other Ambulatory Visit: Payer: Medicare Other | Admitting: *Deleted

## 2018-04-15 NOTE — Patient Outreach (Signed)
[name]N) Care Management  04/15/2018  Dustin CoriaDennis R [name] 1956-08-16 [id number]  [place] NickelDennis R Winburnis an 62 y.o.gentleman with past medical history which includes COPD (Gold IV Classificaton), asthma, hypertension, anxiety and depression, and GERD who has had4inpatient hospital admissions in the last 6 months, all for respiratory ailments:   09/18/17-09/24/17: COPD exacerbation with HCAP 11/22/17-11/29/17: COPD exacerbation/respiratory failure with intubation (Pulmonolgy f/u appointments 12/18/17 & 01/15/18) 01/15/18- 01/21/18:COPD exacerbationand acute/chronic CHF 5/719-01/28/18: COPD exacerbation 02/26/18- ED visit from PCP office (DVT)  I was unable to reach Dustin [name] today to follow up on his COPD Management.  However, I do note in the medical record a call to the PCP office from his home health nurse last week requesting a prescription for Debrox. [name] [place].  Plan: I will continue to follow Dustin [name] for COPD management. I will reach out to Dustin [name] in 1 week. If he remains stable, we will discuss transition to a health coaching program.    Marja Kayslisa Gilboy [facility] Center[facility]HA,BSN,RN,CCM [facility] Care Management  [phone]

## 2018-04-16 ENCOUNTER — Telehealth: Payer: Self-pay | Admitting: Internal Medicine

## 2018-04-16 NOTE — Telephone Encounter (Signed)
Potassium Chloride(K-Dur, Klor-con) 10MEQ tab refill Last Refill:06/22/16 by historical provider Last OV: 03/12/18 PCP: Dr. [name] [facility]acy:[facility]  [address] [place]

## 2018-04-16 NOTE — Telephone Encounter (Signed)
Copied from CRM [phone]. Topic: Quick Communication - Rx Refill/Question >> Apr 16, 2018  2:37 PM Tamela OddiHarris, Ainslie Mazurek J wrote: Medication: potassium chloride (K-DUR,KLOR-CON) 10 MEQ tablet  Patient called to get a refill on the above medication.  CB# [phone]  Preferred Pharmacy (with phone number or street name): [id number] Nicholes Rough[place], [place] - [address] [facility] [address] [phone] (Phone) [phone] (Fax)

## 2018-04-17 ENCOUNTER — Other Ambulatory Visit: Payer: Self-pay | Admitting: Internal Medicine

## 2018-04-17 DIAGNOSIS — Z9981 Dependence on supplemental oxygen: Secondary | ICD-10-CM | POA: Diagnosis not present

## 2018-04-17 DIAGNOSIS — I110 Hypertensive heart disease with heart failure: Secondary | ICD-10-CM | POA: Diagnosis not present

## 2018-04-17 DIAGNOSIS — J441 Chronic obstructive pulmonary disease with (acute) exacerbation: Secondary | ICD-10-CM | POA: Diagnosis not present

## 2018-04-17 DIAGNOSIS — G629 Polyneuropathy, unspecified: Secondary | ICD-10-CM | POA: Diagnosis not present

## 2018-04-17 DIAGNOSIS — J45909 Unspecified asthma, uncomplicated: Secondary | ICD-10-CM | POA: Diagnosis not present

## 2018-04-17 DIAGNOSIS — J9611 Chronic respiratory failure with hypoxia: Secondary | ICD-10-CM | POA: Diagnosis not present

## 2018-04-17 DIAGNOSIS — K219 Gastro-esophageal reflux disease without esophagitis: Secondary | ICD-10-CM | POA: Diagnosis not present

## 2018-04-17 DIAGNOSIS — I5033 Acute on chronic diastolic (congestive) heart failure: Secondary | ICD-10-CM | POA: Diagnosis not present

## 2018-04-17 DIAGNOSIS — E876 Hypokalemia: Secondary | ICD-10-CM

## 2018-04-17 DIAGNOSIS — Z7952 Long term (current) use of systemic steroids: Secondary | ICD-10-CM | POA: Diagnosis not present

## 2018-04-17 DIAGNOSIS — Z7951 Long term (current) use of inhaled steroids: Secondary | ICD-10-CM | POA: Diagnosis not present

## 2018-04-17 MED ORDER — POTASSIUM CHLORIDE CRYS ER 10 MEQ PO TBCR: 10.0000 meq | EXTENDED_RELEASE_TABLET | Freq: Every day | ORAL | 1 refills | Status: DC

## 2018-04-17 NOTE — Telephone Encounter (Signed)
Historical medication last OV 03/12/18 last potassium 3.9 1 month ago ok to fill?

## 2018-04-18 DIAGNOSIS — J449 Chronic obstructive pulmonary disease, unspecified: Secondary | ICD-10-CM | POA: Diagnosis not present

## 2018-04-19 DIAGNOSIS — Z7952 Long term (current) use of systemic steroids: Secondary | ICD-10-CM | POA: Diagnosis not present

## 2018-04-19 DIAGNOSIS — K219 Gastro-esophageal reflux disease without esophagitis: Secondary | ICD-10-CM | POA: Diagnosis not present

## 2018-04-19 DIAGNOSIS — I110 Hypertensive heart disease with heart failure: Secondary | ICD-10-CM | POA: Diagnosis not present

## 2018-04-19 DIAGNOSIS — I5033 Acute on chronic diastolic (congestive) heart failure: Secondary | ICD-10-CM | POA: Diagnosis not present

## 2018-04-19 DIAGNOSIS — J9611 Chronic respiratory failure with hypoxia: Secondary | ICD-10-CM | POA: Diagnosis not present

## 2018-04-19 DIAGNOSIS — Z9981 Dependence on supplemental oxygen: Secondary | ICD-10-CM | POA: Diagnosis not present

## 2018-04-19 DIAGNOSIS — G629 Polyneuropathy, unspecified: Secondary | ICD-10-CM | POA: Diagnosis not present

## 2018-04-19 DIAGNOSIS — J441 Chronic obstructive pulmonary disease with (acute) exacerbation: Secondary | ICD-10-CM | POA: Diagnosis not present

## 2018-04-19 DIAGNOSIS — Z7951 Long term (current) use of inhaled steroids: Secondary | ICD-10-CM | POA: Diagnosis not present

## 2018-04-19 DIAGNOSIS — J45909 Unspecified asthma, uncomplicated: Secondary | ICD-10-CM | POA: Diagnosis not present

## 2018-04-22 ENCOUNTER — Other Ambulatory Visit: Payer: Self-pay | Admitting: *Deleted

## 2018-04-22 NOTE — Patient Outreach (Signed)
[name]) Care Management  04/22/2018  Dustin [name] Feb 15, 1956 [id number]  Dustin [name] an 62 y.o.gentleman with past medical history which includes COPD (Gold IV Classificaton), asthma, hypertension, anxiety and depression, and GERD who has had4inpatient hospital admissions in the last 6 months, all for respiratory ailments:   09/18/17-09/24/17: COPD exacerbation with HCAP 11/22/17-11/29/17: COPD exacerbation/respiratory failure with intubation (Pulmonolgy f/u appointments 12/18/17 & 01/15/18) 01/15/18- 01/21/18:COPD exacerbationand acute/chronic CHF 5/719-01/28/18: COPD exacerbation 02/26/18- ED visit from PCP office (DVT)  I spoke with [name] today to follow up on his COPD Management.   COPD Management- Today, [name]'s reports that his breathing is "pretty good" which he partly attributes to the cooler temperatures over the last few days. He denies experiencing worsened shortness of breath or cough and states he is avoiding going outside in the extreme heat.   DVT/PE- Mr. Dustin MajesticWinburnsays he has no swelling of bilateral lower extremities and denies any symptoms related to his recent DVT. He is taking Xarelto as prescribed.  Home Health Follow Up- [name] [facility] [place].  Psychosocial- [name] privacy and his own space.  Plan: I will continue to follow [name] for COPD management. I will reach out to [name] in 1 month.    [facility] CM Care Plan Problem One     Most Recent Value  Care Plan Problem One  Knowledge Deficits related to changes in pulmonary plan of care  Role Documenting the Problem One  Care Management Coordinator  Care Plan for Problem One  Active  [facility] Long Term Goal   Over the next 31 days, patient will verbalize understanding of long  term plan for mangaement of COPD  [facility] Long Term Goal Start Date  04/05/18  Interventions for Problem One Long Term Goal  symptoms reviewed,  plan of care reviewed,  discussion re: appointments and follow up  [facility] CM Short Term Goal #1   Over the next 30 days, patient will report new or worsened symptoms to pulmonary or prmary care or 911 as appropriate  [facility] CM Short Term Goal #1 Start Date  04/05/18  Interventions for Short Term Goal #1  review of signs and symptoms and when to call provider for help  [facility] CM Short Term Goal #2   Over the next 30 days, patient will take all medications including inhalers as prescribed   [facility] CM Short Term Goal #2 Start Date  04/05/18  Interventions for Short Term Goal #2  medications reviewed      Marja Kayslisa Khaliel Morey [facility] Center[facility]HA,BSN,RN,CCM [facility] Care Management  [phone]

## 2018-04-24 DIAGNOSIS — K219 Gastro-esophageal reflux disease without esophagitis: Secondary | ICD-10-CM | POA: Diagnosis not present

## 2018-04-24 DIAGNOSIS — Z9981 Dependence on supplemental oxygen: Secondary | ICD-10-CM | POA: Diagnosis not present

## 2018-04-24 DIAGNOSIS — Z7951 Long term (current) use of inhaled steroids: Secondary | ICD-10-CM | POA: Diagnosis not present

## 2018-04-24 DIAGNOSIS — I5033 Acute on chronic diastolic (congestive) heart failure: Secondary | ICD-10-CM | POA: Diagnosis not present

## 2018-04-24 DIAGNOSIS — J9611 Chronic respiratory failure with hypoxia: Secondary | ICD-10-CM | POA: Diagnosis not present

## 2018-04-24 DIAGNOSIS — J45909 Unspecified asthma, uncomplicated: Secondary | ICD-10-CM | POA: Diagnosis not present

## 2018-04-24 DIAGNOSIS — J441 Chronic obstructive pulmonary disease with (acute) exacerbation: Secondary | ICD-10-CM | POA: Diagnosis not present

## 2018-04-24 DIAGNOSIS — Z7952 Long term (current) use of systemic steroids: Secondary | ICD-10-CM | POA: Diagnosis not present

## 2018-04-24 DIAGNOSIS — G629 Polyneuropathy, unspecified: Secondary | ICD-10-CM | POA: Diagnosis not present

## 2018-04-24 DIAGNOSIS — I110 Hypertensive heart disease with heart failure: Secondary | ICD-10-CM | POA: Diagnosis not present

## 2018-04-29 ENCOUNTER — Other Ambulatory Visit: Payer: Self-pay | Admitting: Pharmacy Technician

## 2018-04-29 ENCOUNTER — Encounter: Payer: Self-pay | Admitting: Pharmacy Technician

## 2018-04-29 NOTE — Patient Outreach (Signed)
[name]) Care Management  04/29/2018  Dustin [name] 03/05/1956 [id number]   Received patient portion of [employer] patient assistance application for [facility] and Proventil HFA as well as patients proof of income. Mailed completed [employer] application into company and faxed completed application for [employer] and required documents into [employer].  Will follow up with companies in 7-10 business days to check status of applications.  [name], CPhT [name] Care Management [phone]

## 2018-05-01 DIAGNOSIS — Z7951 Long term (current) use of inhaled steroids: Secondary | ICD-10-CM | POA: Diagnosis not present

## 2018-05-01 DIAGNOSIS — Z9981 Dependence on supplemental oxygen: Secondary | ICD-10-CM | POA: Diagnosis not present

## 2018-05-01 DIAGNOSIS — Z7952 Long term (current) use of systemic steroids: Secondary | ICD-10-CM | POA: Diagnosis not present

## 2018-05-01 DIAGNOSIS — K219 Gastro-esophageal reflux disease without esophagitis: Secondary | ICD-10-CM | POA: Diagnosis not present

## 2018-05-01 DIAGNOSIS — I5033 Acute on chronic diastolic (congestive) heart failure: Secondary | ICD-10-CM | POA: Diagnosis not present

## 2018-05-01 DIAGNOSIS — J45909 Unspecified asthma, uncomplicated: Secondary | ICD-10-CM | POA: Diagnosis not present

## 2018-05-01 DIAGNOSIS — G629 Polyneuropathy, unspecified: Secondary | ICD-10-CM | POA: Diagnosis not present

## 2018-05-01 DIAGNOSIS — J9611 Chronic respiratory failure with hypoxia: Secondary | ICD-10-CM | POA: Diagnosis not present

## 2018-05-01 DIAGNOSIS — J441 Chronic obstructive pulmonary disease with (acute) exacerbation: Secondary | ICD-10-CM | POA: Diagnosis not present

## 2018-05-01 DIAGNOSIS — I110 Hypertensive heart disease with heart failure: Secondary | ICD-10-CM | POA: Diagnosis not present

## 2018-05-03 ENCOUNTER — Telehealth: Payer: Self-pay

## 2018-05-03 DIAGNOSIS — I5033 Acute on chronic diastolic (congestive) heart failure: Secondary | ICD-10-CM | POA: Diagnosis not present

## 2018-05-03 DIAGNOSIS — K219 Gastro-esophageal reflux disease without esophagitis: Secondary | ICD-10-CM | POA: Diagnosis not present

## 2018-05-03 DIAGNOSIS — Z7952 Long term (current) use of systemic steroids: Secondary | ICD-10-CM | POA: Diagnosis not present

## 2018-05-03 DIAGNOSIS — J441 Chronic obstructive pulmonary disease with (acute) exacerbation: Secondary | ICD-10-CM | POA: Diagnosis not present

## 2018-05-03 DIAGNOSIS — G629 Polyneuropathy, unspecified: Secondary | ICD-10-CM | POA: Diagnosis not present

## 2018-05-03 DIAGNOSIS — Z7951 Long term (current) use of inhaled steroids: Secondary | ICD-10-CM | POA: Diagnosis not present

## 2018-05-03 DIAGNOSIS — J9611 Chronic respiratory failure with hypoxia: Secondary | ICD-10-CM | POA: Diagnosis not present

## 2018-05-03 DIAGNOSIS — I110 Hypertensive heart disease with heart failure: Secondary | ICD-10-CM | POA: Diagnosis not present

## 2018-05-03 DIAGNOSIS — Z9981 Dependence on supplemental oxygen: Secondary | ICD-10-CM | POA: Diagnosis not present

## 2018-05-03 DIAGNOSIS — J45909 Unspecified asthma, uncomplicated: Secondary | ICD-10-CM | POA: Diagnosis not present

## 2018-05-03 NOTE — Telephone Encounter (Signed)
Copied from CRM [phone] [id number]. Topic: Appointment Scheduling - Scheduling Inquiry for Clinic >> May 03, 2018  1:39 PM Tamela OddiMartin, Don'Quashia, NT wrote: Reason for CRM: Patient is having a PET scan and a CT done in Sept and he would like to see Dr. Shirlee LatchMcLean after those test. She has no opening in Sept. Please call to schedule  [phone]

## 2018-05-06 DIAGNOSIS — Z7951 Long term (current) use of inhaled steroids: Secondary | ICD-10-CM | POA: Diagnosis not present

## 2018-05-06 DIAGNOSIS — I110 Hypertensive heart disease with heart failure: Secondary | ICD-10-CM | POA: Diagnosis not present

## 2018-05-06 DIAGNOSIS — Z9981 Dependence on supplemental oxygen: Secondary | ICD-10-CM | POA: Diagnosis not present

## 2018-05-06 DIAGNOSIS — J441 Chronic obstructive pulmonary disease with (acute) exacerbation: Secondary | ICD-10-CM | POA: Diagnosis not present

## 2018-05-06 DIAGNOSIS — J9611 Chronic respiratory failure with hypoxia: Secondary | ICD-10-CM | POA: Diagnosis not present

## 2018-05-06 DIAGNOSIS — J45909 Unspecified asthma, uncomplicated: Secondary | ICD-10-CM | POA: Diagnosis not present

## 2018-05-06 DIAGNOSIS — G629 Polyneuropathy, unspecified: Secondary | ICD-10-CM | POA: Diagnosis not present

## 2018-05-06 DIAGNOSIS — Z7952 Long term (current) use of systemic steroids: Secondary | ICD-10-CM | POA: Diagnosis not present

## 2018-05-06 DIAGNOSIS — K219 Gastro-esophageal reflux disease without esophagitis: Secondary | ICD-10-CM | POA: Diagnosis not present

## 2018-05-06 DIAGNOSIS — I5033 Acute on chronic diastolic (congestive) heart failure: Secondary | ICD-10-CM | POA: Diagnosis not present

## 2018-05-06 NOTE — Progress Notes (Signed)
Cardiology Office Note  Date:  05/07/2018   ID:  Dustin [name], DOB 03/19/1956, MRN [id number]  PCP:  McLean-Scocuzza, Pasty Spillersracy N, MD   Chief Complaint  Patient presents with  . other    12 mo follow up hx pulmonary emboli.medications verbally reviewed. Pt. c/o chest tightness at times.     HPI:  Dustin [name] is a 62 y.o. male with long history of  smoking at least 30 years, quit September 2017, emphysema,  chronic diastolic CHF,  Obstructive sleep apnea, Wears CPAP Stress test 05/2016: no ischemia On CT scan, minimial if any CAD, PAD. Minimal aortic arch atherosclerosis who presents to the office for follow-up of his shortness of breath, COPD Exacerbation  [facility] reviewed with the patient in detail  Seen in the emergency room 02/26/2018 for leg swelling Hospital records reviewed with the patient in detail swelling in bilateral legs right greater than left and he had an outpatient ultrasound which was positive for DVT.   small pulmonary embolism   At home, pulse ox 88% with exertion,  Typically uses 2 L oxygen all the time at home Notices tachycardia on exertion in the setting of hypoxia chronically elevated Heart rate Previously treated with diltiazem but this was held for low pressure  EKG personally reviewed by myself on todays visit Shows sinus tachycardia with rate 109 bpm right bundle branch block  Other past medical history reviewed  Other past medical history reviewed Admission 01/2017 COPD exacerbation, PNA One week in hospital  CT scan chest /ABD reviewed with him in the office today:  Images pulled up minimial if any CAD, PAD. Minimal aortic arch atherosclerosis  Hospital admission June 22 2016  discharge October 10 for COPD exacerbation with hypoxia. Notes indicating no significant CHF Discharge with antibiotics, prednisone taper  PMH:   has a past medical history of Allergy, Anxiety, Asthma,  Chronic diastolic CHF (congestive heart failure) (HCC), Chronic respiratory failure (HCC), COPD (chronic obstructive pulmonary disease) (HCC), Depression, Emphysema of lung (HCC), GERD (gastroesophageal reflux disease), HCAP (healthcare-associated pneumonia), Headache, Hypertension, Leg DVT (deep venous thromboembolism), acute, bilateral (HCC), Personal history of tobacco use, presenting hazards to health (08/17/2015), Pulmonary embolism (HCC), Pulmonary HTN (HCC), and Tobacco abuse.  PSH:    Past Surgical History:  Procedure Laterality Date  . ABDOMINAL SURGERY    . ADENOIDECTOMY    . CARPAL TUNNEL RELEASE Right   . corpal tunnel Right   . ELBOW SURGERY Right    repaired tendon  . hemmorhoid N/A   . HEMORRHOID SURGERY    . NASAL SINUS SURGERY    . NASAL SINUS SURGERY     1970s  . NOSE SURGERY    . reflux surgery     1994  . ROTATOR CUFF REPAIR Right   . SHOULDER SURGERY    . TENNIS ELBOW RELEASE/NIRSCHEL PROCEDURE Right   . TONSILLECTOMY    . URETHRA SURGERY     surgery 6 times from age [age] yrs old  . URETHRA SURGERY      Current Outpatient Medications  Medication Sig Dispense Refill  . ALPRAZolam (XANAX) 1 MG tablet Take 1 tablet (1 mg total) by mouth 3 (three) times daily as needed for anxiety. 90 tablet 2  . apixaban (ELIQUIS) 5 MG TABS tablet Take 1 tablet (5 mg total) by mouth 2 (two) times daily. 180 tablet 3  . Baclofen 5 MG TABS Take 1 tablet by mouth 2 (two) times daily  as needed (muscle spasms).    . carbamide peroxide (DEBROX) 6.5 % OTIC solution Place 5 drops into both ears 2 (two) times daily. Prn x 4-7 days 15 mL 2  . Cholecalciferol (VITAMIN D) 2000 units tablet Take 2,000 Units by mouth daily.    Marland Kitchen diltiazem (CARDIZEM) 30 MG tablet Take 30 mg by mouth daily.    Marland Kitchen escitalopram (LEXAPRO) 20 MG tablet Take 20 mg by mouth at bedtime.  0  . furosemide (LASIX) 40 MG tablet Take 1 tablet (40 mg total) by mouth daily. In am 90 tablet 0  . gabapentin (NEURONTIN) 300 MG  capsule TAKE 1 TO 2 CAPSULES BY MOUTH THREE TIMES DAILY 270 capsule 0  . magnesium oxide (MAG-OX) 400 MG tablet Take 400 mg by mouth daily.    . mometasone-formoterol (DULERA) 200-5 MCG/ACT AERO Inhale 2 puffs into the lungs 2 (two) times daily. 1 Inhaler 0  . potassium chloride (K-DUR,KLOR-CON) 10 MEQ tablet Take 1-2 tablets (10-20 mEq total) by mouth daily. Repeat if second LASIX taken 180 tablet 1  . Probiotic Product (PROBIOTIC PO) Take by mouth.    Marland Kitchen rOPINIRole (REQUIP) 0.25 MG tablet TAKE 1 TABLET BY MOUTH TWICE DAILY FOR CRAMPS 180 tablet 0  . Spacer/Aero-Holding Chambers (OPTICHAMBER ADVANTAGE-LG MASK) MISC Use as directed with inhaler diag  j44.1 2 each 1  . theophylline (UNIPHYL) 400 MG 24 hr tablet Take 400 mg by mouth every morning.     . Tiotropium Bromide Monohydrate (SPIRIVA RESPIMAT) 2.5 MCG/ACT AERS Inhale into the lungs.     No current facility-administered medications for this visit.      Allergies:   Asa [aspirin]; Bee venom; Codeine; and Ibuprofen   Social History:  The patient  reports that he quit smoking about 3 months ago. His smoking use included cigarettes. He has a 80.00 pack-year smoking history. He has never used smokeless tobacco. He reports that he does not drink alcohol or use drugs.   Family History:   family history includes Alcohol abuse in his daughter; Arthritis in his daughter, father, and mother; Asthma in his daughter and mother; Bipolar disorder in his mother; Birth defects in his brother, paternal grandmother, and sister; CAD in his father; Depression in his daughter, mother, and paternal grandmother; Drug abuse in his daughter; Hearing loss in his father and mother; Heart disease in his brother, father, mother, and paternal grandmother; Hyperlipidemia in his father and mother; Hypertension in his father and mother; Intellectual disability in his daughter; Kidney disease in his mother and son; Malignant hypertension in his mother; Miscarriages /  Stillbirths in his daughter; Peripheral Artery Disease in his mother; Rheum arthritis in his mother; Stroke in his father.    Review of Systems: Review of Systems  Constitutional: Negative.   Respiratory: Positive for cough and shortness of breath.        Congestion  Cardiovascular: Negative.   Gastrointestinal: Negative.   Musculoskeletal: Negative.   Neurological: Negative.   Psychiatric/Behavioral: Negative.   All other systems reviewed and are negative.    PHYSICAL EXAM: VS:  BP 118/78 (BP Location: Right Arm, Patient Position: Sitting, Cuff Size: Normal)   Pulse (!) 109   Ht 5\' 10"  (1.778 m)   Wt 216 lb (98 kg)   BMI 30.99 kg/m  , BMI Body mass index is 30.99 kg/m.  Constitutional:  oriented to person, place, and time. No distress. On nasal cannula oxygen Presenting a wheelchair HENT:  Head: Normocephalic and atraumatic.  Eyes:  no  discharge. No scleral icterus.  Neck: Normal range of motion. Neck supple. No JVD present.  Cardiovascular: Normal rate, regular rhythm, normal heart sounds and intact distal pulses. Exam reveals no gallop and no friction rub. No edema No murmur heard. Pulmonary/Chest: moderately decreased breath sounds throughout,  No stridor. No respiratory distress.  no wheezes.  no rales.  no tenderness.  Abdominal: Soft.  no distension.  no tenderness.  Musculoskeletal: Normal range of motion.  no  tenderness or deformity.  Neurological:  normal muscle tone. Coordination normal. No atrophy Skin: Skin is warm and dry. No rash noted. not diaphoretic.  Psychiatric:  normal mood and affect. behavior is normal. Thought content normal.    Recent Labs: 01/15/2018: TSH 0.701 01/20/2018: Magnesium 2.3 02/26/2018: ALT 11; B Natriuretic Peptide 158.0; BUN 15; Creatinine, Ser 1.22; Hemoglobin 12.6; Platelets 241; Potassium 3.9; Sodium 143    Lipid Panel Lab Results  Component Value Date   CHOL 133 06/01/2016   HDL 59 06/01/2016   LDLCALC 61 06/01/2016   TRIG  70 11/22/2017      Wt Readings from Last 3 Encounters:  05/07/18 216 lb (98 kg)  03/12/18 216 lb 12.8 oz (98.3 kg)  03/12/18 212 lb (96.2 kg)      ASSESSMENT AND PLAN:   Essential hypertension - Plan: EKG 12-Lead Blood pressure is well controlled on today's visit. No changes made to the medications. Would take diltiazem very sparingly for tachycardia  SOB (shortness of breath) - Plan: EKG 12-Lead Severe COPD on chronic oxygen Prior COPD exacerbation admissions to the hospital negative stress test CT scan chest with minimal coronary calcifications noted Recent small PE  DVT bilateral leg Would likely require indefinite eliquis  twice a day At a later date may be able to decrease dose down to 2.5 twice a day for prevention  Chronic diastolic heart failure (HCC)  on Lasix daily with potassium No significant leg edema  COPD exacerbation [facility](HCC) hospital admission with treatment October 2017 occurring antibiotics and steroids Current admission May 2018 Chronic issue  Acute on chronic respiratory failure with hypercapnia (HCC) Stable on 2 L nasal cannula continuous Several inhalers Significant hypoxia on exertion, stable on rest with recovery Associated tachycardia with hypoxia  Tobacco use disorder Reports he no longer smokes  Medical records reviewed recent hospitalizations  long discussion with him concerning recent findings  Total encounter time more than 45 minutes  Greater than 50% was spent in counseling and coordination of care with the patient   Disposition:   F/U  12 months   Orders Placed This Encounter  Procedures  . EKG 12-Lead     Signed, Dossie Arbourim Malick Netz, M.D., Ph.D. 05/07/2018  [facility] [place] [phone]

## 2018-05-07 ENCOUNTER — Ambulatory Visit: Payer: Self-pay | Admitting: Adult Health

## 2018-05-07 ENCOUNTER — Encounter: Payer: Self-pay | Admitting: Cardiovascular Disease

## 2018-05-07 ENCOUNTER — Telehealth: Payer: Self-pay | Admitting: Internal Medicine

## 2018-05-07 ENCOUNTER — Telehealth: Payer: Self-pay | Admitting: Pulmonary Disease

## 2018-05-07 ENCOUNTER — Ambulatory Visit (INDEPENDENT_AMBULATORY_CARE_PROVIDER_SITE_OTHER): Payer: Medicare Other | Admitting: Cardiovascular Disease

## 2018-05-07 ENCOUNTER — Encounter

## 2018-05-07 VITALS — BP 118/78 | HR 109 | Ht 70.0 in | Wt 216.0 lb

## 2018-05-07 DIAGNOSIS — I82403 Acute embolism and thrombosis of unspecified deep veins of lower extremity, bilateral: Secondary | ICD-10-CM | POA: Diagnosis not present

## 2018-05-07 DIAGNOSIS — I5033 Acute on chronic diastolic (congestive) heart failure: Secondary | ICD-10-CM | POA: Diagnosis not present

## 2018-05-07 DIAGNOSIS — F17200 Nicotine dependence, unspecified, uncomplicated: Secondary | ICD-10-CM

## 2018-05-07 DIAGNOSIS — J441 Chronic obstructive pulmonary disease with (acute) exacerbation: Secondary | ICD-10-CM

## 2018-05-07 DIAGNOSIS — I272 Pulmonary hypertension, unspecified: Secondary | ICD-10-CM | POA: Diagnosis not present

## 2018-05-07 DIAGNOSIS — I2699 Other pulmonary embolism without acute cor pulmonale: Secondary | ICD-10-CM | POA: Diagnosis not present

## 2018-05-07 DIAGNOSIS — I10 Essential (primary) hypertension: Secondary | ICD-10-CM

## 2018-05-07 MED ORDER — DILTIAZEM HCL 30 MG PO TABS: 30.0000 mg | ORAL_TABLET | Freq: Three times a day (TID) | ORAL | 2 refills | Status: DC | PRN

## 2018-05-07 MED ORDER — TIOTROPIUM BROMIDE MONOHYDRATE 2.5 MCG/ACT IN AERS: 2.0000 | INHALATION_SPRAY | Freq: Every day | RESPIRATORY_TRACT | 5 refills | Status: DC

## 2018-05-07 NOTE — Telephone Encounter (Signed)
Hi Dr. Shirlee LatchMclean does not prescribe this medication for the patient. Does the provider there prescribe this ? I saw a phone encounter from earlier and saw that the patient asked for samples. Thank you for any help that you can offer.

## 2018-05-07 NOTE — Telephone Encounter (Signed)
Patient in office for visit with Dr. Mariah MillingGollan Patient would like to know if we will be able to provide samples of spiriva or dulera Patient would like to know before leaving Please advise

## 2018-05-07 NOTE — Patient Instructions (Signed)

## 2018-05-07 NOTE — Telephone Encounter (Signed)
Copied from CRM [phone]. Topic: Quick Communication - Rx Refill/Question >> May 07, 2018  3:06 PM Stephannie LiSimmons, Raunak Antuna L, NT wrote: Medication:   Tiotropium Bromide Monohydrate (SPIRIVA RESPIMAT) 2.5 MCG/ACT AERS  Has the patient contacted their pharmacy? yes no (Agent: If no, request that the patient contact the pharmacy for the refill. (Agent: If yes, when and what did the pharmacy advise? Pharmacy told patient they are waiting  for response from the practice  Preferred Pharmacy (with phone number or street name  [id number] Nicholes Rough[name] - [address] [facility] [address] [phone] ([phone] (Fax)    Agent: Please be advised that RX refills may take up to 3 business days. We ask that you follow-up with your pharmacy.

## 2018-05-07 NOTE — Telephone Encounter (Signed)
Unfortunately, we do not have samples for either.

## 2018-05-07 NOTE — Telephone Encounter (Signed)
Pt aware. Nothing further needed 

## 2018-05-07 NOTE — Telephone Encounter (Signed)
Rx has been sent to request pharmacy.

## 2018-05-08 ENCOUNTER — Telehealth: Payer: Self-pay

## 2018-05-08 NOTE — Telephone Encounter (Signed)
Patient has [id number]

## 2018-05-08 NOTE — Telephone Encounter (Signed)
Gave signed home health certification and plan of care with several orders for skilled nursing, physical therapy, and medication changes to advanced home care

## 2018-05-08 NOTE — Telephone Encounter (Signed)
Mailed signed order of performed occupational therapy [facility]

## 2018-05-09 ENCOUNTER — Telehealth: Payer: Self-pay

## 2018-05-09 ENCOUNTER — Other Ambulatory Visit: Payer: Self-pay

## 2018-05-09 ENCOUNTER — Other Ambulatory Visit: Payer: Self-pay | Admitting: Pharmacy Technician

## 2018-05-09 MED ORDER — GABAPENTIN 300 MG PO CAPS: ORAL_CAPSULE | ORAL | 0 refills | Status: DC

## 2018-05-09 MED ORDER — DILTIAZEM HCL 30 MG PO TABS: 30.0000 mg | ORAL_TABLET | Freq: Three times a day (TID) | ORAL | 2 refills | Status: DC | PRN

## 2018-05-09 MED ORDER — THEOPHYLLINE ER 400 MG PO TB24: 400.0000 mg | ORAL_TABLET | ORAL | 1 refills | Status: DC

## 2018-05-09 MED ORDER — ESCITALOPRAM OXALATE 20 MG PO TABS: 20.0000 mg | ORAL_TABLET | Freq: Every day | ORAL | 1 refills | Status: DC

## 2018-05-09 NOTE — Patient Outreach (Signed)
[name]) Care Management  05/10/2018  Dustin CoriaDennis R [name] [birth year] [id number]   Follow up call to check status of patients application for Eliquis. Dustin HesselbachMaria stated that the patient had been denied due to income. Upon reviewing documents she noticed that income had been put in incorrectly on patients application. She sent it back to be reviewed.  Will follow up with Dustin Fisher[employer] in 3-5 business days to check status of application.  Dustin [name], CPhT [name] Care Management [phone]

## 2018-05-09 NOTE — Telephone Encounter (Signed)
Faxed Lilla ShookAshley Coleman paperwork for Patient Assistance Remonia RichterDulera, [place]ir[place] to [phone].[place]

## 2018-05-10 DIAGNOSIS — Z7951 Long term (current) use of inhaled steroids: Secondary | ICD-10-CM | POA: Diagnosis not present

## 2018-05-10 DIAGNOSIS — J45909 Unspecified asthma, uncomplicated: Secondary | ICD-10-CM | POA: Diagnosis not present

## 2018-05-10 DIAGNOSIS — J441 Chronic obstructive pulmonary disease with (acute) exacerbation: Secondary | ICD-10-CM | POA: Diagnosis not present

## 2018-05-10 DIAGNOSIS — I110 Hypertensive heart disease with heart failure: Secondary | ICD-10-CM | POA: Diagnosis not present

## 2018-05-10 DIAGNOSIS — Z7952 Long term (current) use of systemic steroids: Secondary | ICD-10-CM | POA: Diagnosis not present

## 2018-05-10 DIAGNOSIS — J9611 Chronic respiratory failure with hypoxia: Secondary | ICD-10-CM | POA: Diagnosis not present

## 2018-05-10 DIAGNOSIS — G629 Polyneuropathy, unspecified: Secondary | ICD-10-CM | POA: Diagnosis not present

## 2018-05-10 DIAGNOSIS — Z9981 Dependence on supplemental oxygen: Secondary | ICD-10-CM | POA: Diagnosis not present

## 2018-05-10 DIAGNOSIS — K219 Gastro-esophageal reflux disease without esophagitis: Secondary | ICD-10-CM | POA: Diagnosis not present

## 2018-05-10 DIAGNOSIS — I5033 Acute on chronic diastolic (congestive) heart failure: Secondary | ICD-10-CM | POA: Diagnosis not present

## 2018-05-13 ENCOUNTER — Other Ambulatory Visit: Payer: Self-pay

## 2018-05-13 DIAGNOSIS — J9611 Chronic respiratory failure with hypoxia: Secondary | ICD-10-CM | POA: Diagnosis not present

## 2018-05-13 DIAGNOSIS — Z7952 Long term (current) use of systemic steroids: Secondary | ICD-10-CM | POA: Diagnosis not present

## 2018-05-13 DIAGNOSIS — J45909 Unspecified asthma, uncomplicated: Secondary | ICD-10-CM | POA: Diagnosis not present

## 2018-05-13 DIAGNOSIS — Z7951 Long term (current) use of inhaled steroids: Secondary | ICD-10-CM | POA: Diagnosis not present

## 2018-05-13 DIAGNOSIS — I5033 Acute on chronic diastolic (congestive) heart failure: Secondary | ICD-10-CM | POA: Diagnosis not present

## 2018-05-13 DIAGNOSIS — G629 Polyneuropathy, unspecified: Secondary | ICD-10-CM | POA: Diagnosis not present

## 2018-05-13 DIAGNOSIS — I110 Hypertensive heart disease with heart failure: Secondary | ICD-10-CM | POA: Diagnosis not present

## 2018-05-13 DIAGNOSIS — Z9981 Dependence on supplemental oxygen: Secondary | ICD-10-CM | POA: Diagnosis not present

## 2018-05-13 DIAGNOSIS — K219 Gastro-esophageal reflux disease without esophagitis: Secondary | ICD-10-CM | POA: Diagnosis not present

## 2018-05-13 DIAGNOSIS — J441 Chronic obstructive pulmonary disease with (acute) exacerbation: Secondary | ICD-10-CM | POA: Diagnosis not present

## 2018-05-13 NOTE — Progress Notes (Signed)
Please keep me update as I received letter 05/09/18 from [facility] Eliquis program does not qualify   TMS

## 2018-05-13 NOTE — Patient Outreach (Signed)
[name]) Care Management  05/13/2018  Dustin [name] [birth year] [id number]  Incoming message from Dustin [name] on Sunday evening.  Successful outreach attempt this morning to follow up with Dustin [name].  Patient states he got distracted while taking his evening medications and could not remember if he had taken his Eliquis.  He reports that he did not take another dose and was wanting to know if that was the correct thing to do.   Informed him that when in doubt, not to take an extra dose of Eliquis.  Encouraged him to write down when he has taken his Eliquis, since it is important to take this medication  correctly.  He verbalized understanding.   I also informed him that [employer] had incorrectly entered his income for his patient assistance and they are reprocessing his application to see if he is eligible.   Plan: Follow patient assistance along with CPhT, Lilla ShookAshley Coleman.  Berlin HunJennifer Indonesia Mckeough, PharmD Clinical Pharmacist [name] [phone]

## 2018-05-14 ENCOUNTER — Other Ambulatory Visit: Payer: Self-pay | Admitting: *Deleted

## 2018-05-14 ENCOUNTER — Telehealth: Payer: Self-pay | Admitting: *Deleted

## 2018-05-14 ENCOUNTER — Telehealth: Payer: Self-pay

## 2018-05-14 ENCOUNTER — Other Ambulatory Visit: Payer: Self-pay | Admitting: Internal Medicine

## 2018-05-14 DIAGNOSIS — R600 Localized edema: Secondary | ICD-10-CM

## 2018-05-14 DIAGNOSIS — J449 Chronic obstructive pulmonary disease, unspecified: Secondary | ICD-10-CM

## 2018-05-14 DIAGNOSIS — G2581 Restless legs syndrome: Secondary | ICD-10-CM

## 2018-05-14 DIAGNOSIS — F419 Anxiety disorder, unspecified: Secondary | ICD-10-CM

## 2018-05-14 DIAGNOSIS — E876 Hypokalemia: Secondary | ICD-10-CM

## 2018-05-14 DIAGNOSIS — I10 Essential (primary) hypertension: Secondary | ICD-10-CM

## 2018-05-14 DIAGNOSIS — M62838 Other muscle spasm: Secondary | ICD-10-CM

## 2018-05-14 MED ORDER — POTASSIUM CHLORIDE CRYS ER 10 MEQ PO TBCR: 10.0000 meq | EXTENDED_RELEASE_TABLET | Freq: Every day | ORAL | 3 refills | Status: DC

## 2018-05-14 MED ORDER — ESCITALOPRAM OXALATE 20 MG PO TABS: 20.0000 mg | ORAL_TABLET | Freq: Every day | ORAL | 3 refills | Status: DC

## 2018-05-14 MED ORDER — IPRATROPIUM-ALBUTEROL 0.5-2.5 (3) MG/3ML IN SOLN: 3.0000 mL | Freq: Four times a day (QID) | RESPIRATORY_TRACT | 3 refills | Status: DC | PRN

## 2018-05-14 MED ORDER — ROPINIROLE HCL 0.25 MG PO TABS: 0.2500 mg | ORAL_TABLET | Freq: Two times a day (BID) | ORAL | 3 refills | Status: DC

## 2018-05-14 MED ORDER — FUROSEMIDE 40 MG PO TABS: 40.0000 mg | ORAL_TABLET | Freq: Every day | ORAL | 3 refills | Status: DC

## 2018-05-14 MED ORDER — BACLOFEN 5 MG PO TABS: 1.0000 | ORAL_TABLET | Freq: Two times a day (BID) | ORAL | 3 refills | Status: DC | PRN

## 2018-05-14 MED ORDER — GABAPENTIN 300 MG PO CAPS: ORAL_CAPSULE | ORAL | 11 refills | Status: DC

## 2018-05-14 MED ORDER — THEOPHYLLINE ER 400 MG PO TB24: 400.0000 mg | ORAL_TABLET | ORAL | 3 refills | Status: DC

## 2018-05-14 MED ORDER — DILTIAZEM HCL 30 MG PO TABS: 30.0000 mg | ORAL_TABLET | Freq: Three times a day (TID) | ORAL | 3 refills | Status: DC | PRN

## 2018-05-14 MED ORDER — ALPRAZOLAM 1 MG PO TABS: 1.0000 mg | ORAL_TABLET | Freq: Three times a day (TID) | ORAL | 3 refills | Status: DC | PRN

## 2018-05-14 NOTE — Patient Outreach (Signed)
[name]) Care Management  05/14/2018  Hershal CoriaDennis R Oak [birth year] [id number]  [place] NickelDennis R Winburnis an 62 y.o.gentleman with past medical history which includes COPD (Gold IV Classificaton), asthma, hypertension, anxiety and depression, and GERD who has had4inpatient hospital admissions in the last 6 months, all for respiratory ailments:   09/18/17-09/24/17: COPD exacerbation with HCAP 11/22/17-11/29/17: COPD exacerbation/respiratory failure with intubation (Pulmonolgy f/u appointments 12/18/17 & 01/15/18) 01/15/18- 01/21/18:COPD exacerbationand acute/chronic CHF 5/719-01/28/18: COPD exacerbation 02/26/18- ED visit from PCP office (DVT)  I spoke withMr. Preusser today to follow up on his COPD Management.   COPD Management- Today, [name]'s reports that his breathing is "pretty good" since we've had a few days of cooler weather but says he had a harder time breathing when the temperatures were in the 90's and had to be very careful about going outside during the day. He denies new or worsened cough or fever or other respiratory symptom.  DVT/PE- Mr. Martin MajesticWinburnsays he has noswelling of bilateral lower extremities and denies any symptoms related to his recent DVT. He is takingEliquis and is being assisted by our pharmacy team with manufacturer patient assistance and general medication management and education.   Home Health Follow Up- [name] [place]  Care Coordination Needs - [name] is currently scheduled for routine lab work at his PCP office on 05/22/18 @ 8:45 and is also scheduled [facility] for PET/CT scans with an arrival time of 9am on 05/22/18. I reached out to his PCP office on his behalf to request consideration of having [facility] to avoid [name] having to travel to more than one location for services that might be able  to be performed at one location.   Plan: [name] and I discussed his transition to telephonic case management services or health coaching. We will follow up on this over the next few weeks after he gets the results of his PET/CT scans and finds out if there are changes to his plan of care.   [facility] CM Care Plan Problem One     Most Recent Value  Care Plan Problem One  Knowledge Deficits related to changes in pulmonary plan of care  Role Documenting the Problem One  Care Management Coordinator  Care Plan for Problem One  Active  [facility] Long Term Goal   Over the next 31 days, patient will verbalize understanding of long term plan of care for management of pulmonary disease  [facility] Long Term Goal Start Date  05/14/18  Interventions for Problem One Long Term Goal  reviewed current plan of care including upcoming PET/CT scans and anticiapted follow up with provider  [facility] Short [facility] #1   Over the next 14 days, patient will verbalize understanding of provider follow up plans  [facility] Goal #1 Start Date  05/14/18  Interventions for Short Term Goal #1  collaboration with PCP office re: appointment scheduling around diagnostic examinations and PCP appointment scheduling      Marja Kayslisa Gilboy [facility] MontgomeryMHA,BSN,RN,CCM [facility] Care Management  [phone]

## 2018-05-14 NOTE — Telephone Encounter (Signed)
Copied from CRM [phone]. Topic: General - Other >> May 14, 2018 12:47 PM Darletta MollLander, Lumin L wrote: Reason for CRM: [facility] to see if lab order for 09/04 can be changed to hospital location due to patient having a PET scan at the hospital that same day at 9am? Please call her back to advise.

## 2018-05-14 NOTE — Telephone Encounter (Signed)
Copied from CRM [id number]. Topic: General - Other >> May 14, 2018  1:43 PM Gaynelle AduPoole, Shalonda wrote: Reason for CRM:  [facility] is calling to inform the practice that the medication ALPRAZolam (XANAX) 1 MG tablet  has been filled a multiple different location, but they will be filling this medication. Please advise

## 2018-05-14 NOTE — Telephone Encounter (Signed)
Please call local Pharmacy and d/c Xanax only should be getting [facility]  Inform [facility] we are doinjg this   TMS

## 2018-05-14 NOTE — Telephone Encounter (Signed)
Pharmacy called to advise PCP that the Medication alprazolam has been filled at multiple locations,but they will fill medication.

## 2018-05-15 NOTE — Telephone Encounter (Signed)
Pharmacy has been notified.

## 2018-05-16 ENCOUNTER — Other Ambulatory Visit: Payer: Self-pay | Admitting: *Deleted

## 2018-05-16 NOTE — Telephone Encounter (Signed)
Alisa called back in to follow up on request. Is it okay to have orders placed at [facility]?    Alisa would like for office to follow up with pt and advise if change is okay.

## 2018-05-16 NOTE — Telephone Encounter (Signed)
Please advise 

## 2018-05-16 NOTE — Patient Outreach (Signed)
[name] ([facility]) Care Management  05/16/2018  Dustin [name] 06/27/1956 [id number]  Care Coordination Needs - [name] is currently scheduled for routine lab work at his PCP office on 05/22/18 @ 8:45 and is also scheduled at [facility] for PET/CT scans with an arrival time of 9am on 05/22/18. I reached out to his PCP office on Tuesday on his behalf to request consideration of having orders sent to the [facility] lab so that he can make all scheduled appointments.   [name] called me late yesterday afternoon and said he'd not heard from anyone regarding changes to any of his appointments. I called the office of Dr. McClean-Scocuzza this morning to follow up and the office staff member indicated that she would discuss this request with Dr. McLean-Scocuzza and return a call directly to [name]. I advised [name] of this plan and he stated he would call me again today if he hasn't heard from anyone by 2pm.   Plan: I will follow up with [name] by end of day to ensure that his needs have been met.    Alisa Gilboy MHA,BSN,RN,CCM [facility] Care Management  [phone]   

## 2018-05-17 ENCOUNTER — Other Ambulatory Visit: Payer: Self-pay | Admitting: Internal Medicine

## 2018-05-17 DIAGNOSIS — Z13818 Encounter for screening for other digestive system disorders: Secondary | ICD-10-CM

## 2018-05-17 DIAGNOSIS — Z125 Encounter for screening for malignant neoplasm of prostate: Secondary | ICD-10-CM

## 2018-05-17 DIAGNOSIS — Z1159 Encounter for screening for other viral diseases: Secondary | ICD-10-CM

## 2018-05-17 DIAGNOSIS — Z0184 Encounter for antibody response examination: Secondary | ICD-10-CM

## 2018-05-17 DIAGNOSIS — I10 Essential (primary) hypertension: Secondary | ICD-10-CM

## 2018-05-17 DIAGNOSIS — Z1329 Encounter for screening for other suspected endocrine disorder: Secondary | ICD-10-CM

## 2018-05-17 DIAGNOSIS — R739 Hyperglycemia, unspecified: Secondary | ICD-10-CM

## 2018-05-17 DIAGNOSIS — Z13220 Encounter for screening for lipoid disorders: Secondary | ICD-10-CM

## 2018-05-17 NOTE — Telephone Encounter (Signed)
Call Alisa and pt  Cancel labs 05/22/18 if here have done at hospital ordered there please go fasting nothing but water and his medications x 12 hours   Inform both above   Thanks [facility]SM

## 2018-05-19 DIAGNOSIS — J449 Chronic obstructive pulmonary disease, unspecified: Secondary | ICD-10-CM | POA: Diagnosis not present

## 2018-05-21 ENCOUNTER — Telehealth: Payer: Self-pay | Admitting: Pulmonary Disease

## 2018-05-21 MED ORDER — MOMETASONE FURO-FORMOTEROL FUM 200-5 MCG/ACT IN AERO: 2.0000 | INHALATION_SPRAY | Freq: Two times a day (BID) | RESPIRATORY_TRACT | 4 refills | Status: AC

## 2018-05-21 NOTE — Telephone Encounter (Signed)
RX for Elwin Sleight has been sent to the pharmacy.

## 2018-05-21 NOTE — Telephone Encounter (Signed)
Pt would like samples of Dulera. States he will run out today. Please call.

## 2018-05-22 ENCOUNTER — Other Ambulatory Visit (INDEPENDENT_AMBULATORY_CARE_PROVIDER_SITE_OTHER): Payer: Medicare Other

## 2018-05-22 ENCOUNTER — Ambulatory Visit
Admission: RE | Admit: 2018-05-22 | Discharge: 2018-05-22 | Disposition: A | Discharge status: Home / Self Care | Payer: Medicare Other | Source: Ambulatory Visit | Attending: Internal Medicine | Admitting: Internal Medicine

## 2018-05-22 ENCOUNTER — Other Ambulatory Visit: Payer: Medicare Other

## 2018-05-22 DIAGNOSIS — Z0184 Encounter for antibody response examination: Secondary | ICD-10-CM

## 2018-05-22 DIAGNOSIS — J439 Emphysema, unspecified: Secondary | ICD-10-CM

## 2018-05-22 DIAGNOSIS — Z1159 Encounter for screening for other viral diseases: Secondary | ICD-10-CM | POA: Diagnosis not present

## 2018-05-22 DIAGNOSIS — I700 Atherosclerosis of aorta: Secondary | ICD-10-CM | POA: Insufficient documentation

## 2018-05-22 DIAGNOSIS — R739 Hyperglycemia, unspecified: Secondary | ICD-10-CM

## 2018-05-22 DIAGNOSIS — R911 Solitary pulmonary nodule: Secondary | ICD-10-CM | POA: Diagnosis not present

## 2018-05-22 DIAGNOSIS — Z13818 Encounter for screening for other digestive system disorders: Secondary | ICD-10-CM | POA: Diagnosis not present

## 2018-05-22 DIAGNOSIS — I10 Essential (primary) hypertension: Secondary | ICD-10-CM

## 2018-05-22 DIAGNOSIS — Z1329 Encounter for screening for other suspected endocrine disorder: Secondary | ICD-10-CM

## 2018-05-22 DIAGNOSIS — Z13220 Encounter for screening for lipoid disorders: Secondary | ICD-10-CM

## 2018-05-22 DIAGNOSIS — Z125 Encounter for screening for malignant neoplasm of prostate: Secondary | ICD-10-CM

## 2018-05-22 DIAGNOSIS — R918 Other nonspecific abnormal finding of lung field: Secondary | ICD-10-CM | POA: Diagnosis not present

## 2018-05-22 LAB — LIPID PANEL
CHOL/HDL RATIO: 3
Cholesterol: 159 mg/dL (ref 0–200)
HDL: 54.3 mg/dL (ref >39.00)
LDL Cholesterol: 85 mg/dL (ref 0–99)
NONHDL: 104.38
Triglycerides: 96 mg/dL (ref 0.0–149.0)
VLDL: 19.2 mg/dL (ref 0.0–40.0)

## 2018-05-22 LAB — BASIC METABOLIC PANEL
BUN: 16 mg/dL (ref 6–23)
CALCIUM: 9.6 mg/dL (ref 8.4–10.5)
CHLORIDE: 99 meq/L (ref 96–112)
CO2: 33 mEq/L — ABNORMAL HIGH (ref 19–32)
CREATININE: 1.21 mg/dL (ref 0.40–1.50)
GFR: 64.12 mL/min (ref >60.00)
Glucose, Bld: 90 mg/dL (ref 70–99)
Potassium: 4 mEq/L (ref 3.5–5.1)
SODIUM: 144 meq/L (ref 135–145)

## 2018-05-22 LAB — GLUCOSE, CAPILLARY: GLUCOSE-CAPILLARY: 102 mg/dL — AB (ref 70–99)

## 2018-05-22 LAB — POCT I-STAT CREATININE: CREATININE: 1.2 mg/dL (ref 0.61–1.24)

## 2018-05-22 LAB — PSA: PSA: 0.75 ng/mL (ref 0.10–4.00)

## 2018-05-22 LAB — TSH: TSH: 2.08 u[IU]/mL (ref 0.35–4.50)

## 2018-05-22 LAB — HEMOGLOBIN A1C: HEMOGLOBIN A1C: 5.6 % (ref 4.6–6.5)

## 2018-05-22 MED ORDER — FLUDEOXYGLUCOSE F - 18 (FDG) INJECTION
12.3100 | Freq: Once | INTRAVENOUS | Status: AC | PRN
  Administered 2018-05-22: 12.31 via INTRAVENOUS

## 2018-05-22 MED ORDER — IOHEXOL 300 MG/ML  SOLN
75.0000 mL | Freq: Once | INTRAMUSCULAR | Status: AC | PRN
  Administered 2018-05-22: 75 mL via INTRAVENOUS

## 2018-05-22 NOTE — Addendum Note (Signed)
Addended by: London Sheer T on: 05/22/2018 12:35 PM   Modules accepted: Orders

## 2018-05-22 NOTE — Addendum Note (Signed)
Addended by: FRIZZELL, BAILEY T on: 05/22/2018 12:35 PM   Modules accepted: Orders  

## 2018-05-23 ENCOUNTER — Other Ambulatory Visit: Payer: Self-pay | Admitting: *Deleted

## 2018-05-23 LAB — MEASLES/MUMPS/RUBELLA IMMUNITY
MUMPS IGG: 28.7 [AU]/ml
RUBELLA: 1.11 {index}
Rubeola IgG: >300 AU/mL

## 2018-05-23 LAB — HEPATITIS C ANTIBODY
HEP C AB: NONREACTIVE
SIGNAL TO CUT-OFF: 0.02 (ref <1.00)

## 2018-05-23 NOTE — Patient Outreach (Signed)
[name]) Care Management  05/23/2018  TREYTON VELETA 06-May-1956 [id number]  Noted that Mr. Whitsel was able to get his venipuncture appointment rescheduled for after his diagnostic examinations yesterday as requested.   Plan: I will follow up with Mr. Stuchell by phone next week with plans to transition him to our health coaching team or discharge as appropriate.    Marja Kays MHA,BSN,RN,CCM [facility] Care Management  [phone]

## 2018-05-27 ENCOUNTER — Other Ambulatory Visit: Payer: Self-pay

## 2018-05-27 NOTE — Patient Outreach (Addendum)
[facility]) Care Management  05/27/2018  CONNERY NICKELL 03/12/1956 [id number]   Medication Assistance: Mr. Kershner has been approved for patient assistance from [employer] for [employer] and by [employer] for [employer].    Unfortunately, we cannot apply for [employer] patient assistance from [employer] because they do not accept applications from patients that receive any Extra Help LIS.  [name], PharmD Clinical Pharmacist [name] [phone]

## 2018-05-29 ENCOUNTER — Encounter: Payer: Self-pay | Admitting: Internal Medicine

## 2018-05-29 ENCOUNTER — Ambulatory Visit (INDEPENDENT_AMBULATORY_CARE_PROVIDER_SITE_OTHER): Payer: Medicare Other | Admitting: Internal Medicine

## 2018-05-29 VITALS — BP 110/72 | HR 107 | Temp 98.1°F | Ht 70.0 in | Wt 220.8 lb

## 2018-05-29 DIAGNOSIS — L219 Seborrheic dermatitis, unspecified: Secondary | ICD-10-CM

## 2018-05-29 DIAGNOSIS — Z23 Encounter for immunization: Secondary | ICD-10-CM

## 2018-05-29 DIAGNOSIS — F419 Anxiety disorder, unspecified: Secondary | ICD-10-CM

## 2018-05-29 DIAGNOSIS — I10 Essential (primary) hypertension: Secondary | ICD-10-CM

## 2018-05-29 DIAGNOSIS — J439 Emphysema, unspecified: Secondary | ICD-10-CM

## 2018-05-29 DIAGNOSIS — L744 Anhidrosis: Secondary | ICD-10-CM

## 2018-05-29 DIAGNOSIS — J449 Chronic obstructive pulmonary disease, unspecified: Secondary | ICD-10-CM | POA: Insufficient documentation

## 2018-05-29 MED ORDER — KETOCONAZOLE 2 % EX SHAM: 1.0000 "application " | MEDICATED_SHAMPOO | CUTANEOUS | 3 refills | Status: AC

## 2018-05-29 MED ORDER — HYDROCORTISONE 2.5 % EX CREA: TOPICAL_CREAM | Freq: Two times a day (BID) | CUTANEOUS | 11 refills | Status: AC | PRN

## 2018-05-29 NOTE — Patient Instructions (Addendum)
Ref. Range 05/22/2018 12:37  Sodium Latest Ref Range: 135 - 145 mEq/L 144  Potassium Latest Ref Range: 3.5 - 5.1 mEq/L 4.0  Chloride Latest Ref Range: 96 - 112 mEq/L 99  CO2 Latest Ref Range: 19 - 32 mEq/L 33 (H)  Glucose Latest Ref Range: 70 - 99 mg/dL 90  BUN Latest Ref Range: 6 - 23 mg/dL 16  Creatinine Latest Ref Range: 0.40 - 1.50 mg/dL 4.09  Calcium Latest Ref Range: 8.4 - 10.5 mg/dL 9.6  GFR Latest Ref Range: >60.00 mL/min 64.12    Results for Dustin [name], Dustin [name] (MRN [id number]) as of 05/29/2018 09:38  Ref. Range 05/22/2018 12:37  Total CHOL/HDL Ratio Unknown 3  Cholesterol Latest Ref Range: 0 - 200 mg/dL 956  HDL Cholesterol Latest Ref Range: >39.00 mg/dL 21.30  LDL (calc) Latest Ref Range: 0 - 99 mg/dL 85  NonHDL Unknown 865.78  Triglycerides Latest Ref Range: 0.0 - 149.0 mg/dL 46.9  VLDL Latest Ref Range: 0.0 - 40.0 mg/dL 62.9  Results for Dustin [name], Dustin [name] (MRN [id number]) as of 05/29/2018 09:38  Ref. Range 05/22/2018 12:37  Glucose Latest Ref Range: 70 - 99 mg/dL 90  Hemoglobin W1U Latest Ref Range: 4.6 - 6.5 % 5.6  TSH Latest Ref Range: 0.35 - 4.50 uIU/mL 2.08  PSA Latest Ref Range: 0.10 - 4.00 ng/mL 0.75     Chronic Obstructive Pulmonary Disease Chronic obstructive pulmonary disease (COPD) is a long-term (chronic) lung problem. When you have COPD, it is hard for air to get in and out of your lungs. The way your lungs work will never return to normal. Usually the condition gets worse over time. There are things you can do to keep yourself as healthy as possible. Your doctor may treat your condition with:  Medicines.  Quitting smoking, if you smoke.  Rehabilitation. This may involve a team of specialists.  Oxygen.  Exercise and changes to your diet.  Lung surgery.  Comfort measures (palliative care).  Follow these instructions at home: Medicines  Take over-the-counter and prescription medicines only as told by your doctor.  Talk to your doctor before taking any  cough or allergy medicines. You may need to avoid medicines that cause your lungs to be dry. Lifestyle  If you smoke, stop. Smoking makes the problem worse. If you need help quitting, ask your doctor.  Avoid being around things that make your breathing worse. This may include smoke, chemicals, and fumes.  Stay active, but remember to also rest.  Learn and use tips on how to relax.  Make sure you get enough sleep. Most adults need at least 7 hours a night.  Eat healthy foods. Eat smaller meals more often. Rest before meals. Controlled breathing  Learn and use tips on how to control your breathing as told by your doctor. Try: ? Breathing in (inhaling) through your nose for 1 second. Then, pucker your lips and breath out (exhale) through your lips for 2 seconds. ? Putting one hand on your belly (abdomen). Breathe in slowly through your nose for 1 second. Your hand on your belly should move out. Pucker your lips and breathe out slowly through your lips. Your hand on your belly should move in as you breathe out. Controlled coughing  Learn and use controlled coughing to clear mucus from your lungs. The steps are: 1. Lean your head a little forward. 2. Breathe in deeply. 3. Try to hold your breath for 3 seconds. 4. Keep your mouth slightly open while coughing 2  times. 5. Spit any mucus out into a tissue. 6. Rest and do the steps again 1 or 2 times as needed. General instructions  Make sure you get all the shots (vaccines) that your doctor recommends. Ask your doctor about a flu shot and a pneumonia shot.  Use oxygen therapy and therapy to help improve your lungs (pulmonary rehabilitation) if told by your doctor. If you need home oxygen therapy, ask your doctor if you should buy a tool to measure your oxygen level (oximeter).  Make a COPD action plan with your doctor. This helps you know what to do if you feel worse than usual.  Manage any other conditions you have as told by your  doctor.  Avoid going outside when it is very hot, cold, or humid.  Avoid people who have a sickness you can catch (contagious).  Keep all follow-up visits as told by your doctor. This is important. Contact a doctor if:  You cough up more mucus than usual.  There is a change in the color or thickness of the mucus.  It is harder to breathe than usual.  Your breathing is faster than usual.  You have trouble sleeping.  You need to use your medicines more often than usual.  You have trouble doing your normal activities such as getting dressed or walking around the house. Get help right away if:  You have shortness of breath while resting.  You have shortness of breath that stops you from: ? Being able to talk. ? Doing normal activities.  Your chest hurts for longer than 5 minutes.  Your skin color is more blue than usual.  Your pulse oximeter shows that you have low oxygen for longer than 5 minutes.  You have a fever.  You feel too tired to breathe normally. Summary  Chronic obstructive pulmonary disease (COPD) is a long-term lung problem.  The way your lungs work will never return to normal. Usually the condition gets worse over time. There are things you can do to keep yourself as healthy as possible.  Take over-the-counter and prescription medicines only as told by your doctor.  If you smoke, stop. Smoking makes the problem worse. This information is not intended to replace advice given to you by your health care provider. Make sure you discuss any questions you have with your health care provider. Document Released: 02/21/2008 Document Revised: 02/10/2016 Document Reviewed: 05/01/2013 Elsevier Interactive Patient Education  2017 [employer].

## 2018-05-29 NOTE — Telephone Encounter (Signed)
Patient informed at appointment today

## 2018-05-29 NOTE — Progress Notes (Signed)
Chief Complaint  Patient presents with  . Follow-up   F/u  1. Reviewed labs and CT/pet negative malignancy labs normal  2. C/o reduced sweating with aging and wants to know cause  3. C/o flaking to face scalp and ears uses otc dandruff shampoo but not helping  4. HTN controlled on lasix 40, dilt 30 5. Severe COPD on 2L O2 breathing stable on current inhalers down to 2 cig/day 6. Anxiety stable  Review of Systems  Constitutional: Negative for weight loss.  HENT: Negative for hearing loss.   Eyes: Negative for blurred vision.  Respiratory: Positive for wheezing. Negative for cough and sputum production.   Cardiovascular: Negative for chest pain.  Skin: Positive for rash.  Neurological: Negative for headaches.  Psychiatric/Behavioral: The patient is not nervous/anxious.    Past Medical History:  Diagnosis Date  . Allergy   . Anxiety   . Asthma   . Chronic diastolic CHF (congestive heart failure) ([place])    a. echo 07/2013: EF 60-65%, DD, biatrial dilatation, Ao sclerosis, dilated RV, moderate pulmonary HTN, elevated CV and RA pressures; b. patient reported echo at Dr. Laurelyn Sickle office 02/2015 - his office does not have record of him being a pt there c. echo 11/2015: EF 60-65%, Grade 1 DD, mod-severe pulm pressures  . Chronic respiratory failure (HCC)    a. on 2L via nasal cannula; b. secondary to COPD  . COPD (chronic obstructive pulmonary disease) (HCC)    on 2L continuous   . Depression   . Emphysema of lung ([place])   . GERD (gastroesophageal reflux disease)   . HCAP (healthcare-associated pneumonia)    01/22/18-01/28/18   . Headache   . Hypertension   . Leg DVT (deep venous thromboembolism), acute, bilateral ([place])    02/26/18  . Personal history of tobacco use, presenting hazards to health 08/17/2015  . Pulmonary embolism ([place])    02/26/18  . Pulmonary HTN ([place])   . Tobacco abuse    Past Surgical History:  Procedure Laterality Date  . ABDOMINAL SURGERY    . ADENOIDECTOMY    .  CARPAL TUNNEL RELEASE Right   . corpal tunnel Right   . ELBOW SURGERY Right    repaired tendon  . hemmorhoid N/A   . HEMORRHOID SURGERY    . NASAL SINUS SURGERY    . NASAL SINUS SURGERY     1970s  . NOSE SURGERY    . reflux surgery     1994  . ROTATOR CUFF REPAIR Right   . SHOULDER SURGERY    . TENNIS ELBOW RELEASE/NIRSCHEL PROCEDURE Right   . TONSILLECTOMY    . URETHRA SURGERY     surgery 6 times from age [age] yrs old  . URETHRA SURGERY     Family History  Problem Relation Age of Onset  . CAD Father   . Hyperlipidemia Father   . Stroke Father   . Heart disease Father   . Arthritis Father   . Hearing loss Father   . Hypertension Father   . Hypertension Mother   . Peripheral Artery Disease Mother   . Rheum arthritis Mother   . Asthma Mother   . Bipolar disorder Mother   . Depression Mother   . Malignant hypertension Mother   . Arthritis Mother   . Hearing loss Mother   . Heart disease Mother   . Hyperlipidemia Mother   . Kidney disease Mother   . Birth defects Brother   . Heart disease Brother   .  Alcohol abuse Daughter   . Arthritis Daughter   . Asthma Daughter   . Depression Daughter   . Drug abuse Daughter   . Miscarriages / [place] Daughter   . Intellectual disability Daughter   . Kidney disease Son   . Birth defects Paternal Grandmother   . Depression Paternal Grandmother   . Heart disease Paternal Grandmother   . Birth defects Sister   . Diabetes Neg Hx    Social History   Socioeconomic History  . Marital status: Legally Separated    Spouse name: Not on file  . Number of children: Not on file  . Years of education: Not on file  . Highest education level: Not on file  Occupational History  . Not on file  Social Needs  . Financial resource strain: Not on file  . Food insecurity:    Worry: Not on file    Inability: Not on file  . Transportation needs:    Medical: Not on file    Non-medical: Not on file  Tobacco Use  . Smoking status:  Former Smoker    Packs/day: 2.00    Years: 40.00    Pack years: 80.00    Types: Cigarettes    Last attempt to quit: 01/14/2018    Years since quitting: 0.3  . Smokeless tobacco: Never Used  Substance and Sexual Activity  . Alcohol use: No  . Drug use: No  . Sexual activity: Not Currently  Lifestyle  . Physical activity:    Days per week: 0 days    Minutes per session: 0 min  . Stress: Only a little  Relationships  . Social connections:    Talks on phone: Not on file    Gets together: Not on file    Attends religious service: Not on file    Active member of club or organization: Not on file    Attends meetings of clubs or organizations: Not on file    Relationship status: Not on file  . Intimate partner violence:    Fear of current or ex partner: Not on file    Emotionally abused: Not on file    Physically abused: Not on file    Forced sexual activity: Not on file  Other Topics Concern  . Not on file  Social History Narrative   On 2 L chronic home O2.  Lives at home alone    3 kids    No guns    Wears seat belt    Safe in relationship    Current Meds  Medication Sig  . ALPRAZolam (XANAX) 1 MG tablet Take 1 tablet (1 mg total) by mouth 3 (three) times daily as needed for anxiety.  Marland Kitchen apixaban (ELIQUIS) 5 MG TABS tablet Take 1 tablet (5 mg total) by mouth 2 (two) times daily.  . Baclofen 5 MG TABS Take 1 tablet by mouth 2 (two) times daily as needed (muscle spasms).  . carbamide peroxide (DEBROX) 6.5 % OTIC solution Place 5 drops into both ears 2 (two) times daily. Prn x 4-7 days  . Cholecalciferol (VITAMIN D) 2000 units tablet Take 2,000 Units by mouth daily.  Marland Kitchen diltiazem (CARDIZEM) 30 MG tablet Take 1 tablet (30 mg total) by mouth 3 (three) times daily as needed.  Marland Kitchen escitalopram (LEXAPRO) 20 MG tablet Take 1 tablet (20 mg total) by mouth at bedtime.  . furosemide (LASIX) 40 MG tablet Take 1 tablet (40 mg total) by mouth daily. In am  . gabapentin (NEURONTIN) 300  MG  capsule TAKE 1 TO 2 CAPSULES BY MOUTH THREE TIMES DAILY  . ipratropium-albuterol (DUONEB) 0.5-2.5 (3) MG/3ML SOLN Take 3 mLs by nebulization every 6 (six) hours as needed.  . magnesium oxide (MAG-OX) 400 MG tablet Take 400 mg by mouth daily.  . mometasone-formoterol (DULERA) 200-5 MCG/ACT AERO Inhale 2 puffs into the lungs 2 (two) times daily.  . potassium chloride (K-DUR,KLOR-CON) 10 MEQ tablet Take 1-2 tablets (10-20 mEq total) by mouth daily. Repeat if second LASIX taken  . Probiotic Product (PROBIOTIC PO) Take by mouth.  Marland Kitchen rOPINIRole (REQUIP) 0.25 MG tablet Take 1 tablet (0.25 mg total) by mouth 2 (two) times daily.  Marland Kitchen Spacer/Aero-Holding Chambers (OPTICHAMBER ADVANTAGE-LG MASK) MISC Use as directed with inhaler diag  j44.1  . theophylline (UNIPHYL) 400 MG 24 hr tablet Take 1 tablet (400 mg total) by mouth every morning.  . Tiotropium Bromide Monohydrate (SPIRIVA RESPIMAT) 2.5 MCG/ACT AERS Inhale 2 puffs into the lungs daily.   Allergies  Allergen Reactions  . Asa [Aspirin] Other (See Comments)    Reaction: swelling of the right side.  . Bee Venom Swelling  . Codeine Other (See Comments)    Reaction: Difficulty breathing  . Ibuprofen Other (See Comments)    Reaction: Swelling of the right side.  . Iodinated Diagnostic Agents Other (See Comments)    Reaction:  Unknown  Other reaction(s): Unknown   Recent Results (from the past 2160 hour(s))  Glucose, capillary     Status: Abnormal   Collection Time: 05/22/18  9:12 AM  Result Value Ref Range   Glucose-Capillary 102 (H) 70 - 99 mg/dL  I-STAT creatinine     Status: None   Collection Time: 05/22/18  9:28 AM  Result Value Ref Range   Creatinine, Ser 1.20 0.61 - 1.24 mg/dL  Hepatitis C antibody     Status: None   Collection Time: 05/22/18 12:37 PM  Result Value Ref Range   Hepatitis C Ab NON-REACTIVE NON-REACTI   SIGNAL TO CUT-OFF 0.02 <1.00    Comment: . HCV antibody was non-reactive. There is no laboratory  evidence of HCV  infection. . In most cases, no further action is required. However, if recent HCV exposure is suspected, a test for HCV RNA (test code [phone]) is suggested. . For additional information please refer to http://education.questdiagnostics.com/faq/FAQ22v1 (This link is being provided for informational/ educational purposes only.) .   Measles/Mumps/Rubella Immunity     Status: None   Collection Time: 05/22/18 12:37 PM  Result Value Ref Range   [place]ola IgG >300.00 AU/mL    Comment: AU/mL            Interpretation -----            -------------- <25.00           Negative 25.00-29.99      Equivocal >29.99           Positive . A positive result indicates that the patient has antibody to measles virus. It does not differentiate  between an active or past infection. The clinical  diagnosis must be interpreted in conjunction with  clinical signs and symptoms of the patient.    Mumps IgG 28.70 AU/mL    Comment:  AU/mL           Interpretation -------         ---------------- <9.00             Negative 9.00-10.99        Equivocal >10.99  Positive A positive result indicates that the patient has  antibody to mumps virus. It does not differentiate between an  active or past infection. The clinical diagnosis must be interpreted in conjunction with clinical signs and symptoms of the patient. .    Rubella 1.11 index    Comment:     Index            Interpretation     -----            --------------       <0.90            Not consistent with Immunity     0.90-0.99        Equivocal     > or = 1.00      Consistent with Immunity  . The presence of rubella IgG antibody suggests  immunization or past or current infection with rubella virus.   PSA     Status: None   Collection Time: 05/22/18 12:37 PM  Result Value Ref Range   PSA 0.75 0.10 - 4.00 ng/mL    Comment: Test performed using Access Hybritech PSA Assay, a parmagnetic partical, chemiluminecent immunoassay.  Hemoglobin  A1c     Status: None   Collection Time: 05/22/18 12:37 PM  Result Value Ref Range   Hgb A1c MFr Bld 5.6 4.6 - 6.5 %    Comment: Glycemic Control Guidelines for People with Diabetes:Non Diabetic:  <6%Goal of Therapy: <7%Additional Action Suggested:  >8%   TSH     Status: None   Collection Time: 05/22/18 12:37 PM  Result Value Ref Range   TSH 2.08 0.35 - 4.50 uIU/mL  Lipid panel     Status: None   Collection Time: 05/22/18 12:37 PM  Result Value Ref Range   Cholesterol 159 0 - 200 mg/dL    Comment: ATP III Classification       Desirable:  < 200 mg/dL               Borderline High:  200 - 239 mg/dL          High:  > = 240 mg/dL   Triglycerides 96.0 0.0 - 149.0 mg/dL    Comment: Normal:  <150 mg/dLBorderline High:  150 - 199 mg/dL   HDL 54.30 >39.00 mg/dL   VLDL 19.2 0.0 - 40.0 mg/dL   LDL Cholesterol 85 0 - 99 mg/dL   Total CHOL/HDL Ratio 3     Comment:                Men          Women1/2 Average Risk     3.4          3.3Average Risk          5.0          4.42X Average Risk          9.6          7.13X Average Risk          15.0          11.0                       NonHDL 104.38     Comment: NOTE:  Non-HDL goal should be 30 mg/dL higher than patient's LDL goal (i.e. LDL goal of < 70 mg/dL, would have non-HDL goal of < 100 mg/dL)  Basic Metabolic Panel (BMET)     Status: Abnormal  Collection Time: 05/22/18 12:37 PM  Result Value Ref Range   Sodium 144 135 - 145 mEq/L   Potassium 4.0 3.5 - 5.1 mEq/L   Chloride 99 96 - 112 mEq/L   CO2 33 (H) 19 - 32 mEq/L   Glucose, Bld 90 70 - 99 mg/dL   BUN 16 6 - 23 mg/dL   Creatinine, Ser 1.21 0.40 - 1.50 mg/dL   Calcium 9.6 8.4 - 10.5 mg/dL   GFR 64.12 >60.00 mL/min   Objective  Body mass index is 31.68 kg/m. Wt Readings from Last 3 Encounters:  05/29/18 220 lb 12.8 oz (100.2 kg)  05/07/18 216 lb (98 kg)  03/12/18 216 lb 12.8 oz (98.3 kg)   Temp Readings from Last 3 Encounters:  05/29/18 98.1 F (36.7 C) (Oral)  03/12/18 98.5 F (36.9  C) (Oral)  02/28/18 98.1 F (36.7 C) (Oral)   BP Readings from Last 3 Encounters:  05/29/18 110/72  05/07/18 118/78  03/12/18 100/60   Pulse Readings from Last 3 Encounters:  05/29/18 (!) 107  05/07/18 (!) 109  03/12/18 (!) 101    Physical Exam  Constitutional: He is oriented to person, place, and time. Vital signs are normal. He appears well-developed and well-nourished. He is cooperative.  HENT:  Head: Normocephalic and atraumatic.  Mouth/Throat: Oropharynx is clear and moist and mucous membranes are normal.  Eyes: Pupils are equal, round, and reactive to light. Conjunctivae are normal.  Cardiovascular: Normal rate, regular rhythm and normal heart sounds.  Pulmonary/Chest: Effort normal and breath sounds normal.  Neurological: He is alert and oriented to person, place, and time. Gait normal.  Skin: Skin is warm, dry and intact.  Psychiatric: He has a normal mood and affect. His speech is normal and behavior is normal. Judgment and thought content normal. Cognition and memory are normal.  Nursing note and vitals reviewed.   Assessment   1. Severe COPD on 2L o2 [place] breathing stable PET/CT neg malignancy 05/2018   2. Reduced sweating  3. seb derm  4. HTN  5. Anxiety stable  6. HM Plan  1. F/u pulm cont inhalers O2 therapy  rec smoking cessation  2. Check testosterone with next labs  3. hc 2.5 prn and nizoral shampoo 4. Cont meds  5. Monitor  6.  Given flu shot today  Tdap, prevnar check prior PCP records  pna 23 had 01/16/18  Consider shingrix in future  Immune MMR  Consider check hep B, testosterone at f/u labs  Hep C neg  Get records PCP Dr. Clayborn Bigness including stool tests had in the past, vaccines  never had colonoscopy would not be good candidate check stool tests above  Smoker age 40 to 17 max 2 ppd quit 12/2017 now smoking 2 cig per day no FHlung cancer   Provider: Dr. Olivia Mackie McLean-Scocuzza-Internal Medicine

## 2018-05-29 NOTE — Progress Notes (Signed)
Pre visit review using our clinic review tool, if applicable. No additional management support is needed unless otherwise documented below in the visit note. 

## 2018-05-31 ENCOUNTER — Other Ambulatory Visit: Payer: Self-pay | Admitting: *Deleted

## 2018-05-31 NOTE — Patient Outreach (Signed)
[name]) Care Management  05/31/2018  Dustin CoriaDennis R [name] [birth year] [id number]  Dustin NickelDennis R Winburnis an 62 y.o.gentleman with past medical history which includes COPD (Gold IV Classificaton), asthma, hypertension, anxiety and depression, and GERD who has had4inpatient hospital admissions in the last 6 months, all for respiratory ailments:   09/18/17-09/24/17: COPD exacerbation with HCAP 11/22/17-11/29/17: COPD exacerbation/respiratory failure with intubation (Pulmonolgy f/u appointments 12/18/17 & 01/15/18) 01/15/18- 01/21/18:COPD exacerbationand acute/chronic CHF 5/719-01/28/18: COPD exacerbation 02/26/18- ED visit from PCP office (DVT)  I spoke withMr. [name] today to follow up on his COPD Management and discuss long term plans for self health management.   COPD Management- Dustin [name]'s reports that his breathing is "pretty good" and he feels he has stabilized. He has not had another hospitalization or ED visit since June. He is seeing his primary care and pulmonary providers as scheduled. He is taking all medications as prescribed.   Plan: Dustin [name] and I discussed his transition to telephonic case management services or health coaching. He says he feels confident about his current care regimen and long term plan of care especially now that he feels well connected to his pulmonary care team. He does not wish to transition to telephonic case management or health coaching at this time and would like to be able to reach out to us by phone in the future if he feels he has case management needs. I will close Dustin [name]'s case today.    Marja Kayslisa Gilboy MHA,BSN,RN,CCM [facility]  [phone]

## 2018-06-03 ENCOUNTER — Other Ambulatory Visit: Payer: Self-pay | Admitting: Pharmacy Technician

## 2018-06-03 ENCOUNTER — Ambulatory Visit: Payer: Medicare Other | Admitting: Pulmonary Disease

## 2018-06-03 NOTE — Patient Outreach (Signed)
[name]) Care Management  06/03/2018  Dustin CoriaDennis R [name] [birth year] [id number]   Successful outreach call to patient, HIPAA identifiers confirmed. Dustin [name] states that he has not received his [facility] and rescue inhaler yet.  Outreach call to [facility] [employer] Spoke to [place] who stated [facility] Proventil should arrive at patient in home on Thursday, 9/19.  Return call to Dustin [name] to update him on delivery.  Will follow up with patient in 5-7 business days to confirm medication has been received.  [name], CPhT [name] Care Management [phone]

## 2018-06-06 ENCOUNTER — Encounter: Payer: Self-pay | Admitting: Pulmonary Disease

## 2018-06-06 ENCOUNTER — Ambulatory Visit (INDEPENDENT_AMBULATORY_CARE_PROVIDER_SITE_OTHER): Payer: Medicare Other | Admitting: Pulmonary Disease

## 2018-06-06 VITALS — BP 138/90 | HR 99 | Ht 67.0 in | Wt 218.8 lb

## 2018-06-06 DIAGNOSIS — I2699 Other pulmonary embolism without acute cor pulmonale: Secondary | ICD-10-CM | POA: Diagnosis not present

## 2018-06-06 DIAGNOSIS — J9611 Chronic respiratory failure with hypoxia: Secondary | ICD-10-CM

## 2018-06-06 DIAGNOSIS — J432 Centrilobular emphysema: Secondary | ICD-10-CM | POA: Diagnosis not present

## 2018-06-06 DIAGNOSIS — J449 Chronic obstructive pulmonary disease, unspecified: Secondary | ICD-10-CM

## 2018-06-06 DIAGNOSIS — R911 Solitary pulmonary nodule: Secondary | ICD-10-CM | POA: Diagnosis not present

## 2018-06-06 NOTE — Patient Instructions (Addendum)
Continue oxygen therapy is close to 24 hours/day as possible Continue Dulera, Spiriva, theophylline Continue albuterol (inhaler or nebulizer) as needed for increased shortness of breath, chest tightness, wheezing, cough Continue blood thinner medicine.  We should plan on this being a lifelong medication Follow-up in 3 months or sooner as needed

## 2018-06-06 NOTE — Progress Notes (Signed)
PULMONARY OFFICE FOLLOW-UP NOTE  Requesting MD/Service: Self Date of initial consultation: 12/18/17 Reason for consultation: severe COPD, recent intubation for AECOPD  PT PROFILE: 62 y.o. male smoker (up to 2 PPD previously X 40+ yrs, now occasional, none since recent hospitalization) hospitalized 03/07-03/14/19 with ventilator dependent respiratory failure due to AECOPD, PNA. Respiratory virus panel was negative for influenza and positive for rhinovirus. Intubated 03/07 and extubated 03/10. Has prior history of O2 dependent COPD (baseline 2 LPM [place]).   DATA/EVENTS: 06/11/17 CT chest: very severe emphysema. Nonspecific RUL irregular opacity. 9 mm right lower lobe nodule is stable dating back to 11/30/2015 11/23/17 Echocardiogram: LVEF 50-55%, grade I diastolic dysfunction. Mild RV hypokinesis of the apex, RVSP est 45 mmHg 01/22/18 CT chest: Very severe emphysema. New right lower lobe pneumonia. Probable left lower lobe airspace disease is well.  02/26/18 ED visit: CC of LE edema. CTA chest revealed possible PE. LE venous US revealed B DVT 02/26/18 LE venous US: Deep venous thrombosis noted in the right popliteal vein, right posterior tibial vein and left gastroc veins 02/26/18 CTA chest: Findings suspicious for subsegmental pulmonary emboli in the lingula and left lower lobe, motion artifact also considered but felt less likely. Advanced emphysema. Progression in consolidative and nodular opacities in both lower lobes since exam last month. This may be infectious or inflammatory. Given presence of debris in the bronchi, aspiration is considered. Given the nodular appearance, neoplastic process is not excluded, recommend follow-up chest CT in 3 months 05/22/18 CT/PET: Very severe emphysema.  No suspicious hypermetabolic activity within the neck, chest, abdomen or pelvis. The right lower lobe pulmonary nodule is stable, without hypermetabolic activity, consistent with a benign find   INTERVAL: Last  visit 03/12/2018.  No major events since that time  SUBJ:  This is a scheduled follow-up visit.  He has no new complaints.  He remains markedly limited by exertional dyspnea.  He is wearing his oxygen compliantly, close to 24 hours/day.  He remains on Dulera, Spiriva, theophylline.  He is using albuterol (either MDI or nebulizer) up to 6 times per day.  He remains on anticoagulation for recent DVT/PE.  He has no new complaints.   Vitals:   06/06/18 1007 06/06/18 1011  BP: 138/90   Pulse: 99   SpO2: 94% 94%  Weight: 218 lb 12.8 oz (99.2 kg)   Height: 5\' 7"  (1.702 m)   2 LPM [place]  EXAM:  Gen: In WC, no overt distress HEENT: NCAT, sclerae white Neck: No lymphadenopathy, JVP not visualized Lungs: Markedly diminished BS with no wheezes or other adventitious sounds Cardiovascular: RRR, no M Abdomen: Obese, soft, NT, NABS Ext: 2-3+ symmetric pretibial and ankle edema Neuro: No focal deficits noted  DATA:   BMP Latest Ref Rng & Units 05/22/2018 05/22/2018 02/26/2018  Glucose 70 - 99 mg/dL 90 - 86  BUN 6 - 23 mg/dL 16 - 15  Creatinine 0.980.40 - 1.50 mg/dL 1.191.21 1.471.20 8.291.22  Sodium 135 - 145 mEq/L 144 - 143  Potassium 3.5 - 5.1 mEq/L 4.0 - 3.9  Chloride 96 - 112 mEq/L 99 - 98(L)  CO2 19 - 32 mEq/L 33(H) - 35(H)  Calcium 8.4 - 10.5 mg/dL 9.6 - 9.3    CBC Latest Ref Rng & Units 02/26/2018 01/25/2018 01/22/2018  WBC 3.8 - 10.6 K/uL 8.6 14.3(H) 18.5(H)  Hemoglobin 13.0 - 18.0 g/dL 12.6(L) 14.2 16.5  Hematocrit 40.0 - 52.0 % 37.4(L) 43.0 50.6  Platelets 150 - 440 K/uL 241 154 168  CXR: No new film  I have personally reviewed all chest radiographs reported above including CXRs and CT chest unless otherwise indicated  IMPRESSION:     ICD-10-CM   1. Chronic respiratory failure with hypoxia (HCC) J96.11   2. Centrilobular emphysema (HCC) J43.2   3. Very severe COPD J44.9   4. Recent pulmonary embolism I26.99   5. Lung nodule, benign R91.1     PLAN:  Continue oxygen therapy is close to 24  hours/day as possible Continue Dulera, Spiriva, theophylline Continue albuterol (inhaler or nebulizer) as needed for increased shortness of breath, chest tightness, wheezing, cough Continue blood thinner medicine for recent blood clot in legs and lung  Due to cost reasons, he will change from rivaroxaban to apixaban Follow-up in 3-4 months or sooner as needed  Billy Fischer, MD PCCM service Mobile [phone] Pager [phone] 06/06/2018 10:30 AM

## 2018-06-07 ENCOUNTER — Other Ambulatory Visit: Payer: Self-pay | Admitting: Pharmacy Technician

## 2018-06-07 NOTE — Patient Outreach (Signed)
[name]) Care Management  06/07/2018  Dustin [name] March 24, 1956 [id number]   Successful outreach call to patient, HIPAA identifiers verified. [name] confirmed that he received his Dulera and Proventil from [employer] patient assistance. Patient received 3 boxes of each.  Will route note to [facility] [place] HunJennifer Mendenhall for case closure.  [name], CPhT [name] Care Management [phone]

## 2018-06-10 ENCOUNTER — Other Ambulatory Visit: Payer: Self-pay

## 2018-06-10 NOTE — Patient Outreach (Signed)
[name]) Care Management  06/10/2018  Dustin [name] 01/22/1956 [id number]  62year oldmalereferred to [facility] requested for medication assistance for his inhalers. PMHx includes, but not limited to, heart failure, pulmonary hypertension, hypertension and COPD.  Successful outreach attempt to Dustin [name].HIPAA identifiers verified.  Medication Assistance: Dustin [name] confirms receipt of his patient assistance medications Dulera and Proventil HFA.  He states that he is almost out of his escitalopram and theophylline that he ordered [facility].  Call placed to [facility] and they verify delivery of these medications on 9/20 along with gabapentin to his home address via [employer]PS.  He states that he checked his mail on Saturday and it was not in his mailbox.  Requested that he look today and call me back and let me know if he has received it.  Addendum: Incoming call received from Dustin [name].  He states that his prescriptions had been delivered to his apartment complex office and that he has them now.  He states that he has no further medication questions or concerns.   Informed him that I will [facility].  He is aware that he can call me in the future should medication issues arise.  [facility] will reach out to him in December or January to begin the patient assistance application process for 2020.  Plan: [facility].   Send case closure letter to PCP, Dustin [name].  [place] HunJennifer Domonic [place], PharmD Clinical Pharmacist [name] [phone]

## 2018-06-18 DIAGNOSIS — J449 Chronic obstructive pulmonary disease, unspecified: Secondary | ICD-10-CM | POA: Diagnosis not present

## 2018-06-27 ENCOUNTER — Telehealth: Payer: Self-pay | Admitting: Internal Medicine

## 2018-06-27 NOTE — Telephone Encounter (Signed)
Call patient isnt he getting Eliquis blood thinner through patient assistance program received refill request from [facility] does he need this?   TMS

## 2018-07-02 NOTE — Telephone Encounter (Signed)
Patient does not need any sent in he just received some through patient assistance.

## 2018-07-19 DIAGNOSIS — J449 Chronic obstructive pulmonary disease, unspecified: Secondary | ICD-10-CM | POA: Diagnosis not present

## 2018-07-26 ENCOUNTER — Other Ambulatory Visit: Payer: Self-pay | Admitting: *Deleted

## 2018-07-26 NOTE — Patient Outreach (Signed)
[name]) Care Management  07/26/2018  Dustin [name] 1956/02/18 [id number]  Called received from [name] re: his Eliquis patient assistance renewal application. He is concerned about properly completing the needed forms and getting them mailed in a timely manner so that his patient assistance does not lapse.   [name], PharmD is actively following Mr. Pack.   Plan: I will contact Dr. Patricia Pesa by phone and/or secure electronic message to let her know of [name] request.    Marja Kays [facility] Care Management  [phone]

## 2018-07-30 ENCOUNTER — Other Ambulatory Visit: Payer: Self-pay

## 2018-07-30 NOTE — Patient Outreach (Signed)
[facility]) Care Management  07/30/2018  NEIL BRICKELL 1955/09/20 [id number]   Incoming call received from [place], Janalyn Shy last Friday.  She reports that Mr. Najjar had called her and was concerned about his Eliquis patient assistance lapsing.    Successful outreach attempt to Mr. Sinor.  HIPAA identifiers verified.    Mr. Ruark reports he had received a renewal application in the mail from [employer].  He states that he wants to make sure he applies for Eliquis in 2020.    Informed him that his current Eliquis assistance will end 09/17/18.  After that time he will need to show proof that he has spent 3% of his income on prescriptions (~$500) before we can reapply.   Estimate that will be in June/July as it was this year and until then he will have to file through his insurance and pay the co-pays.  In December, [facility] CPhT, [name] will outreach to him about reappling for Proventil HFA and Dulera from [employer], as this company does not require an out of pocket spend to be eligible for their assistance program.  He verbalized understanding and states that he has my phone number and will call me in 2020 when he has met his out of pocket.   Plan: Route note to CPhT, [name]. Joetta Manners, PharmD Clinical Pharmacist [place] [phone]

## 2018-08-01 ENCOUNTER — Telehealth: Payer: Self-pay | Admitting: Internal Medicine

## 2018-08-01 NOTE — Telephone Encounter (Signed)
Copied from CRM [id number]. Topic: Quick Communication - See Telephone Encounter >> Aug 01, 2018  3:05 PM Fanny BienIlderton, Jessica L wrote: CRM for notification. See Telephone encounter for: 08/01/18. Pt called and stated that he needs a note for is apartment complex stating that he needs a certain handicap spot because of his condition. Pt states that he needs a place as close to his apartment as possible. Please advise

## 2018-08-06 ENCOUNTER — Other Ambulatory Visit: Payer: Self-pay

## 2018-08-06 ENCOUNTER — Ambulatory Visit (INDEPENDENT_AMBULATORY_CARE_PROVIDER_SITE_OTHER): Payer: Medicare Other

## 2018-08-06 ENCOUNTER — Encounter: Payer: Self-pay | Admitting: Internal Medicine

## 2018-08-06 VITALS — BP 120/80 | HR 102 | Temp 98.1°F | Resp 18 | Ht 68.0 in | Wt 220.0 lb

## 2018-08-06 DIAGNOSIS — Z0000 Encounter for general adult medical examination without abnormal findings: Secondary | ICD-10-CM | POA: Diagnosis not present

## 2018-08-06 DIAGNOSIS — Z1211 Encounter for screening for malignant neoplasm of colon: Secondary | ICD-10-CM | POA: Diagnosis not present

## 2018-08-06 NOTE — Progress Notes (Signed)
Agree with below   TMS 

## 2018-08-06 NOTE — Telephone Encounter (Signed)
Patient would like Dr French Anaracy to write a short note forhim. He is with denisa now until 10:00. The note is for closer parking to residence  door due  to his walking with oxygen tank and walker. States it is hard for him to park far away and pull tank and push walker.  There is handicap parking but it is always used.

## 2018-08-06 NOTE — Progress Notes (Signed)
Subjective:   Dustin [name] is a 62 y.o. male who presents for an Initial Medicare Annual Wellness Visit.  Review of Systems  No ROS.  Medicare Wellness Visit. Additional risk factors are reflected in the social history. Cardiac Risk Factors include: advanced age (>86men, >70 women);male gender;hypertension;obesity (BMI >30kg/m2);smoking/ tobacco exposure    Objective:    Today's Vitals   08/06/18 0946  BP: 120/80  Pulse: (!) 102  Resp: 18  Temp: 98.1 F (36.7 C)  TempSrc: Oral  SpO2: 91%  Weight: 220 lb (99.8 kg)  Height: 5\' 8"  (1.727 m)   Body mass index is 33.45 kg/m.  Advanced Directives 08/06/2018 01/23/2018 01/22/2018 01/15/2018 12/19/2017 11/28/2017 11/28/2017  Does Patient Have a Medical Advance Directive? Yes Yes Yes Yes Yes Yes Yes  Type of Estate agent of [place];Living will Healthcare Power of [employer] Power of [employer] Power of [employer] Power of [employer] Power of Attorney Healthcare Power of Attorney  Does patient want to make changes to medical advance directive? No - Patient declined No - Patient declined - No - Patient declined No - Patient declined Yes (Inpatient - patient requests chaplain consult to change a medical advance directive) -  Copy of Healthcare Power of Attorney in Chart? Yes - validated most recent copy scanned in chart (See row information) No - copy requested - Yes Yes Yes Yes  Would patient like information on creating a medical advance directive? - - - - No - Patient declined Yes (Inpatient - patient requests chaplain consult to create a medical advance directive) Yes (Inpatient - patient requests chaplain consult to create a medical advance directive)    Current Medications (verified) Outpatient Encounter Medications as of 08/06/2018  Medication Sig  . ALPRAZolam (XANAX) 1 MG tablet Take 1 tablet (1 mg total) by mouth 3 (three) times daily as needed for anxiety.  Marland Kitchen apixaban  (ELIQUIS) 5 MG TABS tablet Take 1 tablet (5 mg total) by mouth 2 (two) times daily.  . Baclofen 5 MG TABS Take 1 tablet by mouth 2 (two) times daily as needed (muscle spasms).  . carbamide peroxide (DEBROX) 6.5 % OTIC solution Place 5 drops into both ears 2 (two) times daily. Prn x 4-7 days  . Cholecalciferol (VITAMIN D) 2000 units tablet Take 2,000 Units by mouth daily.  Marland Kitchen diltiazem (CARDIZEM) 30 MG tablet Take 1 tablet (30 mg total) by mouth 3 (three) times daily as needed.  Marland Kitchen escitalopram (LEXAPRO) 20 MG tablet Take 1 tablet (20 mg total) by mouth at bedtime.  . furosemide (LASIX) 40 MG tablet Take 1 tablet (40 mg total) by mouth daily. In am  . gabapentin (NEURONTIN) 300 MG capsule TAKE 1 TO 2 CAPSULES BY MOUTH THREE TIMES DAILY  . hydrocortisone 2.5 % cream Apply topically 2 (two) times daily as needed. face  . ipratropium-albuterol (DUONEB) 0.5-2.5 (3) MG/3ML SOLN Take 3 mLs by nebulization every 6 (six) hours as needed.  Marland Kitchen ketoconazole (NIZORAL) 2 % shampoo Apply 1 application topically 2 (two) times a week. Let stand for 5 minutes  . magnesium oxide (MAG-OX) 400 MG tablet Take 400 mg by mouth daily.  . mometasone-formoterol (DULERA) 200-5 MCG/ACT AERO Inhale 2 puffs into the lungs 2 (two) times daily.  . potassium chloride (K-DUR,KLOR-CON) 10 MEQ tablet Take 1-2 tablets (10-20 mEq total) by mouth daily. Repeat if second LASIX taken  . Probiotic Product (PROBIOTIC PO) Take by mouth.  Marland Kitchen rOPINIRole (REQUIP) 0.25 MG tablet Take 1  tablet (0.25 mg total) by mouth 2 (two) times daily.  Marland Kitchen Spacer/Aero-Holding Chambers (OPTICHAMBER ADVANTAGE-LG MASK) MISC Use as directed with inhaler diag  j44.1  . theophylline (UNIPHYL) 400 MG 24 hr tablet Take 1 tablet (400 mg total) by mouth every morning.  . Tiotropium Bromide Monohydrate (SPIRIVA RESPIMAT) 2.5 MCG/ACT AERS Inhale 2 puffs into the lungs daily.   No facility-administered encounter medications on file as of 08/06/2018.     Allergies  (verified) Asa [aspirin]; Bee venom; Codeine; Ibuprofen; and Iodinated diagnostic agents   History: Past Medical History:  Diagnosis Date  . Allergy   . Anxiety   . Asthma   . Chronic diastolic CHF (congestive heart failure) (HCC)    a. echo 07/2013: EF 60-65%, DD, biatrial dilatation, Ao sclerosis, dilated RV, moderate pulmonary HTN, elevated CV and RA pressures; b. patient reported echo at Dr. Milta Deiters office 02/2015 - his office does not have record of him being a pt there c. echo 11/2015: EF 60-65%, Grade 1 DD, mod-severe pulm pressures  . Chronic respiratory failure (HCC)    a. on 2L via nasal cannula; b. secondary to COPD  . COPD (chronic obstructive pulmonary disease) (HCC)    on 2L continuous   . Depression   . Emphysema of lung (HCC)   . GERD (gastroesophageal reflux disease)   . HCAP (healthcare-associated pneumonia)    01/22/18-01/28/18   . Headache   . Hypertension   . Leg DVT (deep venous thromboembolism), acute, bilateral (HCC)    02/26/18  . Personal history of tobacco use, presenting hazards to health 08/17/2015  . Pulmonary embolism (HCC)    02/26/18  . Pulmonary HTN (HCC)   . Tobacco abuse    Past Surgical History:  Procedure Laterality Date  . ABDOMINAL SURGERY    . ADENOIDECTOMY    . CARPAL TUNNEL RELEASE Right   . corpal tunnel Right   . ELBOW SURGERY Right    repaired tendon  . hemmorhoid N/A   . HEMORRHOID SURGERY    . NASAL SINUS SURGERY    . NASAL SINUS SURGERY     1970s  . NOSE SURGERY    . reflux surgery     1994  . ROTATOR CUFF REPAIR Right   . SHOULDER SURGERY    . TENNIS ELBOW RELEASE/NIRSCHEL PROCEDURE Right   . TONSILLECTOMY    . URETHRA SURGERY     surgery 6 times from age [age] yrs old  . URETHRA SURGERY     Family History  Problem Relation Age of Onset  . CAD Father   . Hyperlipidemia Father   . Stroke Father   . Heart disease Father   . Arthritis Father   . Hearing loss Father   . Hypertension Father   . Hypertension Mother   .  Peripheral Artery Disease Mother   . Rheum arthritis Mother   . Asthma Mother   . Bipolar disorder Mother   . Depression Mother   . Malignant hypertension Mother   . Arthritis Mother   . Hearing loss Mother   . Heart disease Mother   . Hyperlipidemia Mother   . Kidney disease Mother   . Birth defects Brother   . Heart disease Brother   . Alcohol abuse Daughter   . Arthritis Daughter   . Asthma Daughter   . Depression Daughter   . Drug abuse Daughter   . Miscarriages / [place] Daughter   . Intellectual disability Daughter   . Kidney disease Son   .  Birth defects Paternal Grandmother   . Depression Paternal Grandmother   . Heart disease Paternal Grandmother   . Birth defects Sister   . Diabetes Neg Hx    Social History   Socioeconomic History  . Marital status: Legally Separated    Spouse name: Not on file  . Number of children: Not on file  . Years of education: Not on file  . Highest education level: Not on file  Occupational History  . Not on file  Social Needs  . Financial resource strain: Not hard at all  . Food insecurity:    Worry: Never true    Inability: Never true  . Transportation needs:    Medical: No    Non-medical: No  Tobacco Use  . Smoking status: Former Smoker    Packs/day: 2.00    Years: 40.00    Pack years: 80.00    Types: Cigarettes    Last attempt to quit: 01/14/2018    Years since quitting: 0.5  . Smokeless tobacco: Never Used  Substance and Sexual Activity  . Alcohol use: No  . Drug use: No  . Sexual activity: Not Currently  Lifestyle  . Physical activity:    Days per week: 0 days    Minutes per session: 0 min  . Stress: Only a little  Relationships  . Social connections:    Talks on phone: Not on file    Gets together: Not on file    Attends religious service: Not on file    Active member of club or organization: Not on file    Attends meetings of clubs or organizations: Not on file    Relationship status: Separated   Other Topics Concern  . Not on file  Social History Narrative   On 2 L chronic home O2.  Lives at home alone    3 kids    No guns    Wears seat belt    Safe in relationship    Tobacco Counseling Counseling given: Not Answered   Clinical Intake:  Pre-visit preparation completed: Yes  Pain : No/denies pain     Nutritional Status: BMI > 30  Obese Diabetes: No  How often do you need to have someone help you when you read instructions, pamphlets, or other written materials from your doctor or pharmacy?: 1 - Never  Interpreter Needed?: No     Activities of Daily Living In your present state of health, do you have any difficulty performing the following activities: 08/06/2018 01/23/2018  Hearing? N N  Vision? N N  Difficulty concentrating or making decisions? N N  Walking or climbing stairs? Y Y  Comment - -  Dressing or bathing? N N  Doing errands, shopping? N N  Preparing Food and eating ? N -  Using the Toilet? N -  In the past six months, have you accidently leaked urine? N -  Do you have problems with loss of bowel control? N -  Managing your Medications? N -  Managing your Finances? Y -  Comment Daughter assists as needed -  Housekeeping or managing your Housekeeping? Y -  Comment Daughter assists  -  Some recent data might be hidden     Immunizations and Health Maintenance Immunization History  Administered Date(s) Administered  . Influenza, High Dose Seasonal PF 05/29/2018  . Influenza,inj,Quad PF,6+ Mos 06/24/2016, 07/03/2017  . Influenza-Unspecified 10/23/2012  . Pneumococcal Polysaccharide-23 01/16/2018   Health Maintenance Due  Topic Date Due  . TETANUS/TDAP  03/01/1973  .  Fecal DNA (Cologuard)  03/01/2004    Patient Care Team: McLean-Scocuzza, Pasty Spillersracy N, MD as PCP - General (Internal Medicine) Antonieta IbaGollan, Timothy J, MD as Consulting Physician (Cardiology)  Indicate any recent Medical Services you may have received from other than [facility] providers in  the past year (date may be approximate).    Assessment:   This is a routine wellness examination for [place].  The goal of the wellness visit is to assist the patient how to close the gaps in care and create a preventative care plan for the patient.   The roster of all physicians providing medical care to patient is listed in the Snapshot section of the chart.  Presents with discolored mark/mass inside L arm x3 weeks and R collar bone area x1 day. Red and peeling around the outer edge. Appointment scheduled with his pcp tomorrow.  Requests letter for closer parking at his place of residence; difficult to pull oxygen tank and push walker at a great distance. Deferred to pcp for follow up.  Taking calcium VIT D as appropriate/Osteoporosis risk reviewed.    Safety issues reviewed; Smoke and carbon monoxide detectors in the home. No firearms in the home. Wears seatbelts when driving or riding with others. No violence in the home.  They do not have excessive sun exposure.  Discussed the need for sun protection: hats, long sleeves and the use of sunscreen if there is significant sun exposure.  Patient is alert, normal appearance, oriented to person/place/and time.  Correctly identified the president of the [place] and recalls of 2/3 words. Performs simple calculations and can read correct time from watch face.  Displays appropriate judgement.  No new identified risk were noted.  Daughter assists with housekeeping and financial management as needed. He prepares his own meals, bath/dress, drives, manages home, toileting.   Ambulates with cane or walker as needed. Oxygen remains at 2L.  BMI- discussed the importance of a healthy diet, water intake and the benefits of aerobic exercise. He tries to have a healthy diet and drink plenty of fluids. He plans to continue home physical therapy exercises for leg strengthening exercises.    24 hour diet recall: Regular diet  Dental- every UTD.  Sleep  patterns- Sleeps 6-8 hours at night.  Wakes feeling rested. CPAP in use. Oxygen set at 2L.   Colonoscopy declined. Cologuard consent given; follow as directed.  TDAP vaccine deferred per patient preference.  Follow up with insurance.  Educational material provided.  Hearing/Vision screen Hearing Screening Comments: Patient is able to hear conversational tones without difficulty.  No issues reported.   Vision Screening Comments: Wears glasses. Visual acuity not assessed per patient preference. Appoint[facility] 08/2018.  Dietary issues and exercise activities discussed:    Goals    . Increase physical activity     Continue exercises for leg strength      Depression Screen PHQ 2/9 Scores 08/06/2018 02/28/2018 12/19/2017 10/11/2017  PHQ - 2 Score 0 0 0 2  PHQ- 9 Score - - - 6    Fall Risk Fall Risk  08/06/2018 05/14/2018 03/11/2018 02/28/2018 12/19/2017  Falls in the past year? 0 No No No No  Number falls in past yr: - - - - -  Injury with Fall? - - - - -  Risk for fall due to : - - - - -  Risk for fall due to: Comment - - - - -  Follow up - - - - -   Cognitive  Function:     6CIT Screen 08/06/2018  What Year? 0 points  What month? 0 points  What time? 0 points  Count back from 20 0 points  Months in reverse 0 points    Screening Tests Health Maintenance  Topic Date Due  . TETANUS/TDAP  03/01/1973  . Fecal DNA (Cologuard)  03/01/2004  . INFLUENZA VACCINE  Completed  . Hepatitis C Screening  Completed  . HIV Screening  Completed      Plan:    End of life planning; Advance aging; Advanced directives discussed. Copy of current HCPOA/Living Will on file.    I have personally reviewed and noted the following in the patient's chart:   . Medical and social history . Use of alcohol, tobacco or illicit drugs  . Current medications and supplements . Functional ability and status . Nutritional status . Physical activity . Advanced  directives . List of other physicians . Hospitalizations, surgeries, and ER visits in previous 12 months . Vitals . Screenings to include cognitive, depression, and falls . Referrals and appointments  In addition, I have reviewed and discussed with patient certain preventive protocols, quality metrics, and best practice recommendations. A written personalized care plan for preventive services as well as general preventive health recommendations were provided to patient.     Ashok Pall, LPN   [id number]

## 2018-08-06 NOTE — Patient Instructions (Addendum)
  [name] , Thank you for taking time to come for your Medicare Wellness Visit. I appreciate your ongoing commitment to your health goals. Please review the following plan we discussed and let me know if I can assist you in the future.   Keep all routine maintenance appointments.    Eye exam scheduled 08/2018.  Cologuard ordered; follow as directed.   These are the goals we discussed: Goals    . Increase physical activity     Continue exercises for leg strength       This is a list of the screening recommended for you and due dates:  Health Maintenance  Topic Date Due  . Tetanus Vaccine  03/01/1973  . Cologuard (Stool DNA test)  03/01/2004  . Flu Shot  Completed  .  Hepatitis C: One time screening is recommended by Center for Disease Control  (CDC) for  adults born from [birth year] through 1965.   Completed  . HIV Screening  Completed

## 2018-08-07 ENCOUNTER — Encounter: Payer: Self-pay | Admitting: Internal Medicine

## 2018-08-07 ENCOUNTER — Ambulatory Visit (INDEPENDENT_AMBULATORY_CARE_PROVIDER_SITE_OTHER): Payer: Medicare Other | Admitting: Internal Medicine

## 2018-08-07 VITALS — BP 100/60 | HR 114 | Temp 98.5°F | Ht 68.0 in | Wt 217.2 lb

## 2018-08-07 DIAGNOSIS — L729 Follicular cyst of the skin and subcutaneous tissue, unspecified: Secondary | ICD-10-CM | POA: Diagnosis not present

## 2018-08-07 DIAGNOSIS — J432 Centrilobular emphysema: Secondary | ICD-10-CM

## 2018-08-07 DIAGNOSIS — L821 Other seborrheic keratosis: Secondary | ICD-10-CM | POA: Diagnosis not present

## 2018-08-07 DIAGNOSIS — L0291 Cutaneous abscess, unspecified: Secondary | ICD-10-CM

## 2018-08-07 DIAGNOSIS — L219 Seborrheic dermatitis, unspecified: Secondary | ICD-10-CM

## 2018-08-07 DIAGNOSIS — J9611 Chronic respiratory failure with hypoxia: Secondary | ICD-10-CM

## 2018-08-07 MED ORDER — CLOBETASOL PROPIONATE 0.05 % EX SOLN: 1.0000 "application " | Freq: Two times a day (BID) | CUTANEOUS | 11 refills | Status: AC

## 2018-08-07 MED ORDER — MUPIROCIN 2 % EX OINT: 1.0000 "application " | TOPICAL_OINTMENT | Freq: Two times a day (BID) | CUTANEOUS | 2 refills | Status: DC

## 2018-08-07 MED ORDER — DOXYCYCLINE HYCLATE 100 MG PO TABS: 100.0000 mg | ORAL_TABLET | Freq: Two times a day (BID) | ORAL | 0 refills | Status: DC

## 2018-08-07 NOTE — Progress Notes (Signed)
Pre visit review using our clinic review tool, if applicable. No additional management support is needed unless otherwise documented below in the visit note. 

## 2018-08-07 NOTE — Patient Instructions (Addendum)
Consider Clotrimazole antifungal to face and ears 2x per day for maintenance and it that work Nystatin prescription  Use bactroban/mupuricin also in nose and ear and left arm 2x per day x 7-14 days  Antibacterial soap otc Cetaphil bar soap or Hibiclens   Call lung doctors to schedule an appt and ask about Trelegy   Seborrheic Keratosis Seborrheic keratosis is a common, noncancerous (benign) skin growth. This condition causes waxy, rough, tan, brown, or black spots to appear on the skin. These skin growths can be flat or raised. What are the causes? The cause of this condition is not known. What increases the risk? This condition is more likely to develop in:  People who have a family history of seborrheic keratosis.  People who are 103 or older.  People who are pregnant.  People who have had estrogen replacement therapy.  What are the signs or symptoms? This condition often occurs on the face, chest, shoulders, back, or other areas. These growths:  Are usually painless, but may become irritated and itchy.  Can be yellow, brown, black, or other colors.  Are slightly raised or have a flat surface.  Are sometimes rough or wart-like in texture.  Are often waxy on the surface.  Are round or oval-shaped.  Sometimes look like they are "stuck on."  Often occur in groups, but may occur as a single growth.  How is this diagnosed? This condition is diagnosed with a medical history and physical exam. A sample of the growth may be tested (skin biopsy). You may need to see a skin specialist (dermatologist). How is this treated? Treatment is not usually needed for this condition, unless the growths are irritated or are often bleeding. You may also choose to have the growths removed if you do not like their appearance. Most commonly, these growths are treated with a procedure in which liquid nitrogen is applied to "freeze" off the growth (cryosurgery). They may also be burned off with  electricity or cut off. Follow these instructions at home:  Watch your growth for any changes.  Keep all follow-up visits as told by your health care provider. This is important.  Do not scratch or pick at the growth or growths. This can cause them to become irritated or infected. Contact a health care provider if:  You suddenly have many new growths.  Your growth bleeds, itches, or hurts.  Your growth suddenly becomes larger or changes color. This information is not intended to replace advice given to you by your health care provider. Make sure you discuss any questions you have with your health care provider. Document Released: 10/07/2010 Document Revised: 02/10/2016 Document Reviewed: 01/20/2015 Elsevier Interactive Patient Education  2018 [employer].  Skin Abscess A skin abscess is an infected area on or under your skin that contains a collection of pus and other material. An abscess may also be called a furuncle, carbuncle, or boil. An abscess can occur in or on almost any part of your body. Some abscesses break open (rupture) on their own. Most continue to get worse unless they are treated. The infection can spread deeper into the body and eventually into your blood, which can make you feel ill. Treatment usually involves draining the abscess. What are the causes? An abscess occurs when germs, often bacteria, pass through your skin and cause an infection. This may be caused by:  A scrape or cut on your skin.  A puncture wound through your skin, including a needle injection.  Blocked oil  or sweat glands.  Blocked and infected hair follicles.  A cyst that forms beneath your skin (sebaceous cyst) and becomes infected.  What increases the risk? This condition is more likely to develop in people who:  Have a weak body defense system (immune system).  Have diabetes.  Have dry and irritated skin.  Get frequent injections or use illegal IV drugs.  Have a foreign body in  a wound, such as a splinter.  Have problems with their lymph system or veins.  What are the signs or symptoms? An abscess may start as a painful, firm bump under the skin. Over time, the abscess may get larger or become softer. Pus may appear at the top of the abscess, causing pressure and pain. It may eventually break through the skin and drain. Other symptoms include:  Redness.  Warmth.  Swelling.  Tenderness.  A sore on the skin.  How is this diagnosed? This condition is diagnosed based on your medical history and a physical exam. A sample of pus may be taken from the abscess to find out what is causing the infection and what antibiotics can be used to treat it. You also may have:  Blood tests to look for signs of infection or spread of an infection to your blood.  Imaging studies such as ultrasound, CT scan, or MRI if the abscess is deep.  How is this treated? Small abscesses that drain on their own may not need treatment. Treatment for an abscess that does not rupture on its own may include:  Warm compresses applied to the area several times per day.  Incision and drainage. Your health care provider will make an incision to open the abscess and will remove pus and any foreign body or dead tissue. The incision area may be packed with gauze to keep it open for a few days while it heals.  Antibiotic medicines to treat infection. For a severe abscess, you may first get antibiotics through an IV and then change to oral antibiotics.  Follow these instructions at home: Abscess Care  If you have an abscess that has not drained, place a warm, clean, wet washcloth over the abscess several times a day. Do this as told by your health care provider.  Follow instructions from your health care provider about how to take care of your abscess. Make sure you: ? Cover the abscess with a bandage (dressing). ? Change your dressing or gauze as told by your health care provider. ? Wash your  hands with soap and water before you change the dressing or gauze. If soap and water are not available, use hand sanitizer.  Check your abscess every day for signs of a worsening infection. Check for: ? More redness, swelling, or pain. ? More fluid or blood. ? Warmth. ? More pus or a bad smell. Medicines  Take over-the-counter and prescription medicines only as told by your health care provider.  If you were prescribed an antibiotic medicine, take it as told by your health care provider. Do not stop taking the antibiotic even if you start to feel better. General instructions  To avoid spreading the infection: ? Do not share personal care items, towels, or hot tubs with others. ? Avoid making skin contact with other people.  Keep all follow-up visits as told by your health care provider. This is important. Contact a health care provider if:  You have more redness, swelling, or pain around your abscess.  You have more fluid or blood coming from  your abscess.  Your abscess feels warm to the touch.  You have more pus or a bad smell coming from your abscess.  You have a fever.  You have muscle aches.  You have chills or a general ill feeling. Get help right away if:  You have severe pain.  You see red streaks on your skin spreading away from the abscess. This information is not intended to replace advice given to you by your health care provider. Make sure you discuss any questions you have with your health care provider. Document Released: 06/14/2005 Document Revised: 04/30/2016 Document Reviewed: 07/14/2015 Elsevier Interactive Patient Education  [employer].

## 2018-08-07 NOTE — Progress Notes (Addendum)
Chief Complaint  Patient presents with  . Follow-up   F/u  1. C/o right neck lesion and left upper arm abscess which drained w/in the last 1-2 weeks and is now red. He noticed lesion in the left upper arm nothing tried no bug bite  2. COPD stable on 2L portable o2 today needs to sch f/u pulmonary disc trelegy today but currently out of samples, abrasion to left helix 2/2 O2 tubing advised pt to disc with pulm 3. seb derm to scalp, face, ears improved but still with flaking despite nizoral shampoo and hc cream to face  4. He needs letter to get closer handicap parking as spots at his home are not available letter given today    Review of Systems  Constitutional: Negative for weight loss.  HENT: Negative for hearing loss.   Eyes: Negative for blurred vision.  Respiratory: Negative for shortness of breath.   Cardiovascular: Negative for chest pain.  Gastrointestinal: Negative for abdominal pain.  Musculoskeletal: Negative for falls.  Skin: Positive for rash.  Neurological: Negative for headaches.  Psychiatric/Behavioral: The patient is nervous/anxious.    Past Medical History:  Diagnosis Date  . Allergy   . Anxiety   . Asthma   . Chronic diastolic CHF (congestive heart failure) ([place])    a. echo 07/2013: EF 60-65%, DD, biatrial dilatation, Ao sclerosis, dilated RV, moderate pulmonary HTN, elevated CV and RA pressures; b. patient reported echo at Dr. Laurelyn Sickle office 02/2015 - his office does not have record of him being a pt there c. echo 11/2015: EF 60-65%, Grade 1 DD, mod-severe pulm pressures  . Chronic respiratory failure ([facility])    a. on 2L via nasal cannula; b. secondary to COPD  . COPD (chronic obstructive pulmonary disease) ([facility])    on 2L continuous   . Depression   . Emphysema of lung ([place])   . GERD (gastroesophageal reflux disease)   . HCAP (healthcare-associated pneumonia)    01/22/18-01/28/18   . Headache   . Hypertension   . Leg DVT (deep venous thromboembolism), acute,  bilateral ([place])    02/26/18  . Personal history of tobacco use, presenting hazards to health 08/17/2015  . Pulmonary embolism ([place])    02/26/18  . Pulmonary HTN ([place])   . Tobacco abuse    Past Surgical History:  Procedure Laterality Date  . ABDOMINAL SURGERY    . ADENOIDECTOMY    . CARPAL TUNNEL RELEASE Right   . corpal tunnel Right   . ELBOW SURGERY Right    repaired tendon  . hemmorhoid N/A   . HEMORRHOID SURGERY    . NASAL SINUS SURGERY    . NASAL SINUS SURGERY     1970s  . NOSE SURGERY    . reflux surgery     1994  . ROTATOR CUFF REPAIR Right   . SHOULDER SURGERY    . TENNIS ELBOW RELEASE/NIRSCHEL PROCEDURE Right   . TONSILLECTOMY    . URETHRA SURGERY     surgery 6 times from age [age] yrs old  . URETHRA SURGERY     Family History  Problem Relation Age of Onset  . CAD Father   . Hyperlipidemia Father   . Stroke Father   . Heart disease Father   . Arthritis Father   . Hearing loss Father   . Hypertension Father   . Hypertension Mother   . Peripheral Artery Disease Mother   . Rheum arthritis Mother   . Asthma Mother   . Bipolar disorder  Mother   . Depression Mother   . Malignant hypertension Mother   . Arthritis Mother   . Hearing loss Mother   . Heart disease Mother   . Hyperlipidemia Mother   . Kidney disease Mother   . Birth defects Brother   . Heart disease Brother   . Alcohol abuse Daughter   . Arthritis Daughter   . Asthma Daughter   . Depression Daughter   . Drug abuse Daughter   . Miscarriages / [place] Daughter   . Intellectual disability Daughter   . Kidney disease Son   . Birth defects Paternal Grandmother   . Depression Paternal Grandmother   . Heart disease Paternal Grandmother   . Birth defects Sister   . Diabetes Neg Hx    Social History   Socioeconomic History  . Marital status: Legally Separated    Spouse name: Not on file  . Number of children: Not on file  . Years of education: Not on file  . Highest education level: Not  on file  Occupational History  . Not on file  Social Needs  . Financial resource strain: Not hard at all  . Food insecurity:    Worry: Never true    Inability: Never true  . Transportation needs:    Medical: No    Non-medical: No  Tobacco Use  . Smoking status: Former Smoker    Packs/day: 2.00    Years: 40.00    Pack years: 80.00    Types: Cigarettes    Last attempt to quit: 01/14/2018    Years since quitting: 0.5  . Smokeless tobacco: Never Used  Substance and Sexual Activity  . Alcohol use: No  . Drug use: No  . Sexual activity: Not Currently  Lifestyle  . Physical activity:    Days per week: 0 days    Minutes per session: 0 min  . Stress: Only a little  Relationships  . Social connections:    Talks on phone: Not on file    Gets together: Not on file    Attends religious service: Not on file    Active member of club or organization: Not on file    Attends meetings of clubs or organizations: Not on file    Relationship status: Separated  . Intimate partner violence:    Fear of current or ex partner: Not on file    Emotionally abused: Not on file    Physically abused: Not on file    Forced sexual activity: Not on file  Other Topics Concern  . Not on file  Social History Narrative   On 2 L chronic home O2.  Lives at home alone    3 kids    No guns    Wears seat belt    Safe in relationship    Current Meds  Medication Sig  . ALPRAZolam (XANAX) 1 MG tablet Take 1 tablet (1 mg total) by mouth 3 (three) times daily as needed for anxiety.  Marland Kitchen apixaban (ELIQUIS) 5 MG TABS tablet Take 1 tablet (5 mg total) by mouth 2 (two) times daily.  . Baclofen 5 MG TABS Take 1 tablet by mouth 2 (two) times daily as needed (muscle spasms).  . carbamide peroxide (DEBROX) 6.5 % OTIC solution Place 5 drops into both ears 2 (two) times daily. Prn x 4-7 days  . Cholecalciferol (VITAMIN D) 2000 units tablet Take 2,000 Units by mouth daily.  Marland Kitchen diltiazem (CARDIZEM) 30 MG tablet Take 1  tablet (30 mg  total) by mouth 3 (three) times daily as needed.  Marland Kitchen escitalopram (LEXAPRO) 20 MG tablet Take 1 tablet (20 mg total) by mouth at bedtime.  . furosemide (LASIX) 40 MG tablet Take 1 tablet (40 mg total) by mouth daily. In am  . gabapentin (NEURONTIN) 300 MG capsule TAKE 1 TO 2 CAPSULES BY MOUTH THREE TIMES DAILY  . hydrocortisone 2.5 % cream Apply topically 2 (two) times daily as needed. face  . ipratropium-albuterol (DUONEB) 0.5-2.5 (3) MG/3ML SOLN Take 3 mLs by nebulization every 6 (six) hours as needed.  Marland Kitchen ketoconazole (NIZORAL) 2 % shampoo Apply 1 application topically 2 (two) times a week. Let stand for 5 minutes  . magnesium oxide (MAG-OX) 400 MG tablet Take 400 mg by mouth daily.  . mometasone-formoterol (DULERA) 200-5 MCG/ACT AERO Inhale 2 puffs into the lungs 2 (two) times daily.  . potassium chloride (K-DUR,KLOR-CON) 10 MEQ tablet Take 1-2 tablets (10-20 mEq total) by mouth daily. Repeat if second LASIX taken  . Probiotic Product (PROBIOTIC PO) Take by mouth.  Marland Kitchen rOPINIRole (REQUIP) 0.25 MG tablet Take 1 tablet (0.25 mg total) by mouth 2 (two) times daily.  Marland Kitchen Spacer/Aero-Holding Chambers (OPTICHAMBER ADVANTAGE-LG MASK) MISC Use as directed with inhaler diag  j44.1  . theophylline (UNIPHYL) 400 MG 24 hr tablet Take 1 tablet (400 mg total) by mouth every morning.  . Tiotropium Bromide Monohydrate (SPIRIVA RESPIMAT) 2.5 MCG/ACT AERS Inhale 2 puffs into the lungs daily.   Allergies  Allergen Reactions  . Asa [Aspirin] Other (See Comments)    Reaction: swelling of the right side.  . Bee Venom Swelling  . Codeine Other (See Comments)    Reaction: Difficulty breathing  . Ibuprofen Other (See Comments)    Reaction: Swelling of the right side.  . Iodinated Diagnostic Agents Other (See Comments)    Reaction:  Unknown  Other reaction(s): Unknown   Recent Results (from the past 2160 hour(s))  Glucose, capillary     Status: Abnormal   Collection Time: 05/22/18  9:12 AM  Result  Value Ref Range   Glucose-Capillary 102 (H) 70 - 99 mg/dL  I-STAT creatinine     Status: None   Collection Time: 05/22/18  9:28 AM  Result Value Ref Range   Creatinine, Ser 1.20 0.61 - 1.24 mg/dL  Hepatitis C antibody     Status: None   Collection Time: 05/22/18 12:37 PM  Result Value Ref Range   Hepatitis C Ab NON-REACTIVE NON-REACTI   SIGNAL TO CUT-OFF 0.02 <1.00    Comment: . HCV antibody was non-reactive. There is no laboratory  evidence of HCV infection. . In most cases, no further action is required. However, if recent HCV exposure is suspected, a test for HCV RNA (test code [phone]) is suggested. . For additional information please refer to http://education.questdiagnostics.com/faq/FAQ22v1 (This link is being provided for informational/ educational purposes only.) .   Measles/Mumps/Rubella Immunity     Status: None   Collection Time: 05/22/18 12:37 PM  Result Value Ref Range   Rubeola IgG >300.00 AU/mL    Comment: AU/mL            Interpretation -----            -------------- <25.00           Negative 25.00-29.99      Equivocal >29.99           Positive . A positive result indicates that the patient has antibody to measles virus. It does not differentiate  between  an active or past infection. The clinical  diagnosis must be interpreted in conjunction with  clinical signs and symptoms of the patient.    Mumps IgG 28.70 AU/mL    Comment:  AU/mL           Interpretation -------         ---------------- <9.00             Negative 9.00-10.99        Equivocal >10.99            Positive A positive result indicates that the patient has  antibody to mumps virus. It does not differentiate between an  active or past infection. The clinical diagnosis must be interpreted in conjunction with clinical signs and symptoms of the patient. .    Rubella 1.11 index    Comment:     Index            Interpretation     -----            --------------       <0.90            Not  consistent with Immunity     0.90-0.99        Equivocal     > or = 1.00      Consistent with Immunity  . The presence of rubella IgG antibody suggests  immunization or past or current infection with rubella virus.   PSA     Status: None   Collection Time: 05/22/18 12:37 PM  Result Value Ref Range   PSA 0.75 0.10 - 4.00 ng/mL    Comment: Test performed using Access Hybritech PSA Assay, a parmagnetic partical, chemiluminecent immunoassay.  Hemoglobin A1c     Status: None   Collection Time: 05/22/18 12:37 PM  Result Value Ref Range   Hgb A1c MFr Bld 5.6 4.6 - 6.5 %    Comment: Glycemic Control Guidelines for People with Diabetes:Non Diabetic:  <6%Goal of Therapy: <7%Additional Action Suggested:  >8%   TSH     Status: None   Collection Time: 05/22/18 12:37 PM  Result Value Ref Range   TSH 2.08 0.35 - 4.50 uIU/mL  Lipid panel     Status: None   Collection Time: 05/22/18 12:37 PM  Result Value Ref Range   Cholesterol 159 0 - 200 mg/dL    Comment: ATP III Classification       Desirable:  < 200 mg/dL               Borderline High:  200 - 239 mg/dL          High:  > = 240 mg/dL   Triglycerides 96.0 0.0 - 149.0 mg/dL    Comment: Normal:  <150 mg/dLBorderline High:  150 - 199 mg/dL   HDL 54.30 >39.00 mg/dL   VLDL 19.2 0.0 - 40.0 mg/dL   LDL Cholesterol 85 0 - 99 mg/dL   Total CHOL/HDL Ratio 3     Comment:                Men          Women1/2 Average Risk     3.4          3.3Average Risk          5.0          4.42X Average Risk          9.6  7.13X Average Risk          15.0          11.0                       NonHDL 104.38     Comment: NOTE:  Non-HDL goal should be 30 mg/dL higher than patient's LDL goal (i.e. LDL goal of < 70 mg/dL, would have non-HDL goal of < 100 mg/dL)  Basic Metabolic Panel (BMET)     Status: Abnormal   Collection Time: 05/22/18 12:37 PM  Result Value Ref Range   Sodium 144 135 - 145 mEq/L   Potassium 4.0 3.5 - 5.1 mEq/L   Chloride 99 96 - 112 mEq/L   CO2  33 (H) 19 - 32 mEq/L   Glucose, Bld 90 70 - 99 mg/dL   BUN 16 6 - 23 mg/dL   Creatinine, Ser 1.21 0.40 - 1.50 mg/dL   Calcium 9.6 8.4 - 10.5 mg/dL   GFR 64.12 >60.00 mL/min   Objective  Body mass index is 33.03 kg/m. Wt Readings from Last 3 Encounters:  08/07/18 217 lb 3.2 oz (98.5 kg)  08/06/18 220 lb (99.8 kg)  06/06/18 218 lb 12.8 oz (99.2 kg)   Temp Readings from Last 3 Encounters:  08/07/18 98.5 F (36.9 C) (Oral)  08/06/18 98.1 F (36.7 C) (Oral)  05/29/18 98.1 F (36.7 C) (Oral)   BP Readings from Last 3 Encounters:  08/07/18 100/60  08/06/18 120/80  06/06/18 138/90   Pulse Readings from Last 3 Encounters:  08/07/18 (!) 114  08/06/18 (!) 102  06/06/18 99    Physical Exam  Constitutional: He is oriented to person, place, and time. Vital signs are normal. He appears well-developed and well-nourished. He is cooperative.  HENT:  Head: Normocephalic and atraumatic.  Mouth/Throat: Oropharynx is clear and moist and mucous membranes are normal.  Eyes: Pupils are equal, round, and reactive to light. Conjunctivae are normal.  Cardiovascular: Regular rhythm and normal heart sounds. Tachycardia present.  Pulmonary/Chest: Effort normal and breath sounds normal. He has no wheezes.  Neurological: He is alert and oriented to person, place, and time. Gait normal.  Skin: Skin is warm and dry.     seb derm to scalp, ears, face   Psychiatric: He has a normal mood and affect. His behavior is normal. Judgment and thought content normal. Cognition and memory are normal.  Nursing note and vitals reviewed.   Assessment   1. SK right neck  2. Left upper arm abscess  3. COPD stable on 2L O2  4. HM 5. seb derm to scalp, face ears Plan   1. Given info monitor  2. Doxycycline bid with bactroban and hibiclens or cetaphil antibacterial soap  tx nares with light coat of bactroban 3. Cont meds consider trelegy disc today  rec call to sch f/u with pulm  Prn mucinex/robitussin dm    Given small gauze to place above left ear with laceration from tubing use bactroban there as well  4.  Flu shot utd  Tdap, prevnar check prior PCP records  pna 23 had 01/16/18  Consider shingrix in future Immune MMR  Consider check hep B, testosterone at f/u labs  Hep C neg Ordered cologaurd   Get records PCP Dr. Clayborn Bigness including stool tests had in the past, vaccines  never had colonoscopy would not be good candidate check stool tests above  Smoker age 70 to 40 max 2 ppd quit 12/2017  now smoking 2 cig per day no FHlung cancer  5. Add prn  Temovate solution to meds  Provider: Dr. Olivia Mackie McLean-Scocuzza-[facility]

## 2018-08-12 ENCOUNTER — Other Ambulatory Visit: Payer: Self-pay | Admitting: Pharmacy Technician

## 2018-08-12 ENCOUNTER — Encounter: Payer: Self-pay | Admitting: Pharmacy Technician

## 2018-08-12 NOTE — Patient Outreach (Signed)
[name] Care Management  08/12/2018  Hershal CoriaDennis R Vandermeulen [birth year] [id number]  Successful call to Mr. Vallez in reference to 2020 patient assistance. Patient is agreeable to reapplying for [employer] patient assistance for [facility] and Proventil HFA. We are also going to apply for Spiriva Respimat.  Prepared patient portion to be mailed and will send provider portion via [facility] Catie Feliz [facility].  Will follow up with patient in 7-10 business days (due to holiday) to confirm applications have been received.  Suzan SlickAshley N. Effie Shyoleman, CPhT Certified Pharmacy Technician [name] Care Management Direct Dial:[phone]

## 2018-08-18 DIAGNOSIS — J449 Chronic obstructive pulmonary disease, unspecified: Secondary | ICD-10-CM | POA: Diagnosis not present

## 2018-08-19 DIAGNOSIS — H2513 Age-related nuclear cataract, bilateral: Secondary | ICD-10-CM | POA: Diagnosis not present

## 2018-08-26 ENCOUNTER — Ambulatory Visit: Payer: Self-pay | Admitting: Pharmacy Technician

## 2018-08-26 ENCOUNTER — Other Ambulatory Visit: Payer: Self-pay | Admitting: Pharmacy Technician

## 2018-08-26 NOTE — Patient Outreach (Signed)
[name] Care Management  08/26/2018  Hershal CoriaDennis R Bos Mar 17, 1956 [id number]   Received provider portion of Spiriva Respimat application. Submitted completed application and required documents to [employer].  Will follow up with company in 7-10 business days to check status of application.  Suzan SlickAshley N. Effie Shyoleman, CPhT Certified Pharmacy Technician [name] Care Management Direct Dial:[phone]

## 2018-08-27 ENCOUNTER — Other Ambulatory Visit: Payer: Self-pay | Admitting: Pharmacy Technician

## 2018-08-27 NOTE — Patient Outreach (Signed)
[name] Care Management  08/27/2018  Hershal CoriaDennis R Ruotolo 1955-09-21 [id number]   Received provider portion of [employer] patient assistance application for [facility] and Proventil. Mailed completed application and required documents into company.  Will follow up in 10-14 business days to confirm application has been received.  Suzan SlickAshley N. Effie Shyoleman, CPhT Certified Pharmacy Technician [name] Care Management Direct Dial:[phone]

## 2018-08-29 ENCOUNTER — Telehealth: Payer: Self-pay | Admitting: Internal Medicine

## 2018-08-29 DIAGNOSIS — Z1212 Encounter for screening for malignant neoplasm of rectum: Secondary | ICD-10-CM | POA: Diagnosis not present

## 2018-08-29 DIAGNOSIS — Z1211 Encounter for screening for malignant neoplasm of colon: Secondary | ICD-10-CM | POA: Diagnosis not present

## 2018-08-29 LAB — COLOGUARD: COLOGUARD: NEGATIVE

## 2018-08-29 NOTE — Telephone Encounter (Signed)
Received information from patient. Resending paperwork now via fax.

## 2018-08-29 NOTE — Telephone Encounter (Signed)
Call pt and ask income and Household size for pt assistance application for Eliquis    TMS

## 2018-09-03 ENCOUNTER — Other Ambulatory Visit: Payer: Self-pay | Admitting: Pharmacy Technician

## 2018-09-03 NOTE — Patient Outreach (Signed)
[name] Care Management  09/03/2018  Hershal CoriaDennis R Rupert 11/02/1955 [id number]    Follow up call placed to [employer] regarding patient assistance application for Spiriva , Darl PikesSusan stated that patient has been temporarily approved and company requires LIS denial letter.    Successful call placed to patient regarding patient assistance application for Spiriva , HIPAA identifiers verified. Patient confirms receiving a 3 month supply from [employer]. Patient agreeable to me helping him apply for LIS. Requested patient contact me once he receives the determination letter in the mail.  Will follow up with patients [employer] application for [employer] T[employer]Dulera and Proventil in the next 5-6 business days.  Suzan SlickAshley N. Effie Shyoleman, CPhT Certified Pharmacy Technician [name] Care Management Direct [phone]

## 2018-09-04 ENCOUNTER — Telehealth: Payer: Self-pay | Admitting: Internal Medicine

## 2018-09-04 ENCOUNTER — Encounter: Payer: Self-pay | Admitting: Internal Medicine

## 2018-09-04 NOTE — Telephone Encounter (Signed)
cologuard negative 08/29/18   TMS

## 2018-09-05 NOTE — Telephone Encounter (Signed)
Patient was informed of results.  Patient understood and no questions, comments, or concerns at this time. Patient has had a few days where his BP was low 90's over mid-high 50's.  His legs are still a little swollen still.

## 2018-09-05 NOTE — Telephone Encounter (Signed)
He can try to reduce diltiazem to 2x per day  If legs still swollen try elevation higher than heart, compression stockings to the knee (does he need Rx?)  He can also do lasix 40 in am and 1/2 pill 20 mg at lunch  If this does not help schedule appt   TMS

## 2018-09-06 NOTE — Telephone Encounter (Signed)
Patient was informed.  Patient understood and no questions, comments, or concerns at this time.  

## 2018-09-18 DIAGNOSIS — J449 Chronic obstructive pulmonary disease, unspecified: Secondary | ICD-10-CM | POA: Diagnosis not present

## 2018-09-25 ENCOUNTER — Other Ambulatory Visit: Payer: Self-pay | Admitting: Pharmacy Technician

## 2018-09-25 ENCOUNTER — Telehealth: Payer: Self-pay | Admitting: Internal Medicine

## 2018-09-25 NOTE — Patient Outreach (Signed)
[name]) Care Management  09/25/2018  MIRL TROCCOLI 1956-05-21 [id number]    Successful call placed to patient regarding patient assistance [employer] attestation form, HIPAA identifiers verified. Mr. Trivino informed me that he has already received the attestation form from [employer] and mailed it back into the company on 1/7. He stated he received a letter from Social security that he thinks was about his application for LIS. I informed him that I would mail him a return envelope so that I can submit the letter to [employer] to get his enrollment for Spiriva Respimat extended.  Will follow up with [employer] in 7-10 business days to check status of Attestation form.  Suzan Slick Effie Shy CPhT Certified Pharmacy Technician [name] Care Management Direct Dial:[phone]

## 2018-09-25 NOTE — Telephone Encounter (Signed)
[facility] he should have 1 year supply on xanax  Filled 04/2018 for 1 year supply  Inform pt to call [facility] as well   TMS

## 2018-09-25 NOTE — Telephone Encounter (Unsigned)
Copied from CRM [id number]. Topic: Quick Communication - Rx Refill/Question >> Sep 25, 2018 11:08 AM Darletta Moll L wrote: Medication: ALPRAZolam Prudy Feeler) 1 MG tablet  Has the patient contacted their pharmacy? Yes.   (Agent: If no, request that the patient contact the pharmacy for the refill.) (Agent: If yes, when and what did the pharmacy advise?) contacted the office already 2x   Preferred Pharmacy (with phone number or street name): [facility] - [place], [place] - [address] [place] [address] [place] [address] Phone: [phone] Fax: [phone]  Agent: Please be advised that RX refills may take up to 3 business days. We ask that you follow-up with your pharmacy.

## 2018-09-25 NOTE — Telephone Encounter (Signed)
He should not be due sent 1 year supply to [facility]  Call [facility] [phone]  TMS

## 2018-09-26 NOTE — Telephone Encounter (Signed)
Patient informed, he stated he was going to call [facility].

## 2018-09-27 ENCOUNTER — Other Ambulatory Visit: Payer: Self-pay | Admitting: Internal Medicine

## 2018-09-27 ENCOUNTER — Telehealth: Payer: Self-pay | Admitting: *Deleted

## 2018-09-27 DIAGNOSIS — F419 Anxiety disorder, unspecified: Secondary | ICD-10-CM

## 2018-09-27 MED ORDER — ALPRAZOLAM 1 MG PO TABS: 1.0000 mg | ORAL_TABLET | Freq: Three times a day (TID) | ORAL | 3 refills | Status: DC | PRN

## 2018-09-27 NOTE — Telephone Encounter (Signed)
Yes [facility] confirmed 60 day rx with three refill. He is currently on his last refill and is needing refill of medication. The 90 days supply should be 270 tablets 3 refills.

## 2018-09-27 NOTE — Telephone Encounter (Signed)
Copied from CRM [id number]. Topic: General - Other >> Sep 26, 2018  5:05 PM Marylen Ponto wrote: Reason for CRM: Pt returned call to [facility]. Pt stated he spoke with [facility] and was told that the last request they received was a 60 day prescription with no refills. Pt requests a call back. Cb# [phone]

## 2018-09-27 NOTE — Telephone Encounter (Signed)
Sent #90 RF x 3 [facility] Xanax  TMS

## 2018-10-08 ENCOUNTER — Encounter: Payer: Self-pay | Admitting: Pharmacy Technician

## 2018-10-10 ENCOUNTER — Telehealth: Payer: Self-pay | Admitting: Pulmonary Disease

## 2018-10-10 MED ORDER — AZITHROMYCIN 250 MG PO TABS: ORAL_TABLET | ORAL | 0 refills | Status: AC

## 2018-10-10 MED ORDER — PREDNISONE 20 MG PO TABS: 20.0000 mg | ORAL_TABLET | Freq: Every day | ORAL | 0 refills | Status: DC

## 2018-10-10 NOTE — Telephone Encounter (Signed)
LMTCB

## 2018-10-10 NOTE — Telephone Encounter (Signed)
Please prescribed  Prednisone 20 mg daily for 7 days Z pak

## 2018-10-10 NOTE — Telephone Encounter (Signed)
rx sent  Pt notified. 

## 2018-10-14 ENCOUNTER — Telehealth: Payer: Self-pay | Admitting: Pulmonary Disease

## 2018-10-14 DIAGNOSIS — J449 Chronic obstructive pulmonary disease, unspecified: Secondary | ICD-10-CM

## 2018-10-14 NOTE — Telephone Encounter (Signed)
I spoke to patient, let him know Dr. Sung Amabile wants him to come in for apt, and needs CXR before. Scheduled for 10/16/18, patient aware with nothing further needed at this time.

## 2018-10-14 NOTE — Telephone Encounter (Signed)
Schedule F/U with me 01/29 (Wed) with CXR prior to that visit. I have placed CXR order  Thanks  Theodoro Grist

## 2018-10-16 ENCOUNTER — Ambulatory Visit
Admission: RE | Admit: 2018-10-16 | Discharge: 2018-10-16 | Disposition: A | Discharge status: Home / Self Care | Payer: Medicare Other | Source: Ambulatory Visit | Attending: Pulmonary Disease | Admitting: Pulmonary Disease

## 2018-10-16 ENCOUNTER — Encounter: Payer: Self-pay | Admitting: Pulmonary Disease

## 2018-10-16 ENCOUNTER — Ambulatory Visit: Payer: Medicare Other | Admitting: Pulmonary Disease

## 2018-10-16 VITALS — BP 110/70 | HR 109 | Resp 16 | Ht 68.0 in | Wt 220.0 lb

## 2018-10-16 DIAGNOSIS — R0602 Shortness of breath: Secondary | ICD-10-CM | POA: Diagnosis not present

## 2018-10-16 DIAGNOSIS — J42 Unspecified chronic bronchitis: Secondary | ICD-10-CM | POA: Diagnosis not present

## 2018-10-16 DIAGNOSIS — J441 Chronic obstructive pulmonary disease with (acute) exacerbation: Secondary | ICD-10-CM | POA: Diagnosis not present

## 2018-10-16 DIAGNOSIS — J449 Chronic obstructive pulmonary disease, unspecified: Secondary | ICD-10-CM | POA: Insufficient documentation

## 2018-10-16 DIAGNOSIS — R079 Chest pain, unspecified: Secondary | ICD-10-CM | POA: Diagnosis not present

## 2018-10-16 DIAGNOSIS — J9611 Chronic respiratory failure with hypoxia: Secondary | ICD-10-CM

## 2018-10-16 MED ORDER — PREDNISONE 20 MG PO TABS: 40.0000 mg | ORAL_TABLET | Freq: Every day | ORAL | 0 refills | Status: AC

## 2018-10-16 MED ORDER — DOXYCYCLINE HYCLATE 100 MG PO TABS: 100.0000 mg | ORAL_TABLET | Freq: Two times a day (BID) | ORAL | 0 refills | Status: DC

## 2018-10-16 NOTE — Patient Instructions (Signed)
Continue all of your previous medications with addition of the following.  New prescription: Prednisone 40 mg (2 tabs 20 mg) daily for 5 days New prescription: Doxycycline 100 mg twice a day for 7 days  Follow-up in 2-3 weeks for quick recheck.  At that time we will consider initiating chronic azithromycin to help reduce the inflammation in your airways

## 2018-10-18 NOTE — Progress Notes (Signed)
PULMONARY OFFICE FOLLOW-UP NOTE  Requesting MD/Service: Self Date of initial consultation: 12/18/17 Reason for consultation: severe COPD, recent intubation for AECOPD  PT PROFILE: 63 y.o. male smoker (up to 2 PPD previously X 40+ yrs, now occasional, none since recent hospitalization) hospitalized 03/07-03/14/19 with ventilator dependent respiratory failure due to AECOPD, PNA. Respiratory virus panel was negative for influenza and positive for rhinovirus. Intubated 03/07 and extubated 03/10. Has prior history of O2 dependent COPD (baseline 2 LPM [place]).   DATA/EVENTS: 06/11/17 CT chest: very severe emphysema. Nonspecific RUL irregular opacity. 9 mm right lower lobe nodule is stable dating back to 11/30/2015 11/23/17 Echocardiogram: LVEF 50-55%, grade I diastolic dysfunction. Mild RV hypokinesis of the apex, RVSP est 45 mmHg 01/22/18 CT chest: Very severe emphysema. New right lower lobe pneumonia. Probable left lower lobe airspace disease is well.  02/26/18 ED visit: CC of LE edema. CTA chest revealed possible PE. LE venous US revealed B DVT 02/26/18 LE venous US: Deep venous thrombosis noted in the right popliteal vein, right posterior tibial vein and left gastroc veins 02/26/18 CTA chest: Findings suspicious for subsegmental pulmonary emboli in the lingula and left lower lobe, motion artifact also considered but felt less likely. Advanced emphysema. Progression in consolidative and nodular opacities in both lower lobes since exam last month. This may be infectious or inflammatory. Given presence of debris in the bronchi, aspiration is considered. Given the nodular appearance, neoplastic process is not excluded, recommend follow-up chest CT in 3 months 05/22/18 CT/PET: Very severe emphysema.  No suspicious hypermetabolic activity within the neck, chest, abdomen or pelvis. The right lower lobe pulmonary nodule is stable, without hypermetabolic activity, consistent with a benign find   INTERVAL: Last  visit 06/06/18.  No major pulmonary events in the interim until past 2 weeks as described below  SUBJ:  This is a scheduled follow-up visit.  Since last visit, he has done very well until the past 2 weeks.  Now he reports increased shortness of breath, chest tightness, cough, thick pale green mucus.  A prescription for prednisone and azithromycin was called in by Dr. Belia HemanKasa in my absence.  He does not feel significant improvement.  He denies fever, hemoptysis, change in lower extremity edema, calf tenderness.  He remains on Dulera, Spiriva, theophylline and is using these medications compliantly.  He uses albuterol rescue medicine (either MDI or nebulizer) up to 6 times per day.  He remains on anticoagulation for recent VTE.   Vitals:   10/16/18 0945 10/16/18 0953  BP:  110/70  Pulse:  (!) 109  Resp: 16   SpO2:  (!) 88%  Weight: 220 lb (99.8 kg)   Height: 5\' 8"  (1.727 m)   2 LPM [place]  EXAM:  Gen: In WC, no overt distress, raspy voice quality HEENT: NCAT, sclerae white Neck: No lymphadenopathy, JVP not visualized Lungs: Breath sounds markedly diminished, no wheezes, moderate diffuse rhonchi Cardiovascular: RRR, no M Abdomen: Obese, soft, NT, NABS Ext: 2+ symmetric pretibial and ankle edema Neuro: No focal deficits noted  DATA:   BMP Latest Ref Rng & Units 05/22/2018 05/22/2018 02/26/2018  Glucose 70 - 99 mg/dL 90 - 86  BUN 6 - 23 mg/dL 16 - 15  Creatinine 4.740.40 - 1.50 mg/dL 2.591.21 5.631.20 8.751.22  Sodium 135 - 145 mEq/L 144 - 143  Potassium 3.5 - 5.1 mEq/L 4.0 - 3.9  Chloride 96 - 112 mEq/L 99 - 98(L)  CO2 19 - 32 mEq/L 33(H) - 35(H)  Calcium 8.4 - 10.5 mg/dL  9.6 - 9.3    CBC Latest Ref Rng & Units 02/26/2018 01/25/2018 01/22/2018  WBC 3.8 - 10.6 K/uL 8.6 14.3(H) 18.5(H)  Hemoglobin 13.0 - 18.0 g/dL 12.6(L) 14.2 16.5  Hematocrit 40.0 - 52.0 % 37.4(L) 43.0 50.6  Platelets 150 - 440 K/uL 241 154 168    CXR 1/29: Chronic findings of hyperinflation and chronic bibasilar atelectasis versus  scarring.  No acute findings  I have personally reviewed all chest radiographs reported above including CXRs and CT chest unless otherwise indicated  IMPRESSION:     ICD-10-CM   1. Chronic hypoxemic respiratory failure (HCC) J96.11   2. COPD, very severe (HCC) J44.9   3. COPD exacerbation (HCC) J44.1   4. Chronic bronchitis J42     PLAN:  Continue oxygen therapy is close to 24 hours/day as possible Continue Dulera, Spiriva, theophylline Continue albuterol (inhaler or nebulizer) as needed for increased shortness of breath, chest tightness, wheezing, cough Continue apixaban (lifelong) for recent VTE  New prescription: Prednisone 40 mg daily for 5 days  New prescription: Doxycycline 100 mg twice daily for 7 days  Follow-up in 2-3 weeks for quick recheck.  At that time we will consider initiating chronic azithromycin to help reduce airway inflammation.     Billy Fischer, MD PCCM service Mobile [phone] Pager [phone] 10/18/2018 1:30 PM

## 2018-10-19 DIAGNOSIS — J449 Chronic obstructive pulmonary disease, unspecified: Secondary | ICD-10-CM | POA: Diagnosis not present

## 2018-10-30 ENCOUNTER — Telehealth: Payer: Self-pay | Admitting: Pulmonary Disease

## 2018-10-30 NOTE — Telephone Encounter (Signed)
Patient wanted to schedule appt for tomorrow, 10/31/2018 and also requesting call back from nurse today.

## 2018-10-30 NOTE — Telephone Encounter (Signed)
Called and spoke to patient. Recommended he schedule and apt for today, gave options of times. He is trying to find a ride, then will call us back if able to find a way here.

## 2018-10-31 ENCOUNTER — Ambulatory Visit: Payer: Medicare Other | Admitting: Internal Medicine

## 2018-10-31 ENCOUNTER — Encounter: Payer: Self-pay | Admitting: Internal Medicine

## 2018-10-31 VITALS — BP 124/80 | HR 122 | Temp 98.7°F | Ht 68.0 in | Wt 226.8 lb

## 2018-10-31 DIAGNOSIS — J441 Chronic obstructive pulmonary disease with (acute) exacerbation: Secondary | ICD-10-CM

## 2018-10-31 DIAGNOSIS — J9611 Chronic respiratory failure with hypoxia: Secondary | ICD-10-CM | POA: Diagnosis not present

## 2018-10-31 MED ORDER — PREDNISONE 20 MG PO TABS: 40.0000 mg | ORAL_TABLET | Freq: Every day | ORAL | 0 refills | Status: DC

## 2018-10-31 MED ORDER — IPRATROPIUM-ALBUTEROL 0.5-2.5 (3) MG/3ML IN SOLN
3.0000 mL | Freq: Once | RESPIRATORY_TRACT | Status: AC
  Administered 2018-10-31: 3 mL via RESPIRATORY_TRACT

## 2018-10-31 MED ORDER — AMOXICILLIN-POT CLAVULANATE 875-125 MG PO TABS: 1.0000 | ORAL_TABLET | Freq: Two times a day (BID) | ORAL | 0 refills | Status: AC

## 2018-10-31 NOTE — Progress Notes (Signed)
PULMONARY OFFICE FOLLOW-UP NOTE  Requesting MD/Service: Self Date of initial consultation: 12/18/17 Reason for consultation: severe COPD, recent intubation for AECOPD  PT PROFILE: 63 y.o. male smoker (up to 2 PPD previously X 40+ yrs, now occasional, none since recent hospitalization) hospitalized 03/07-03/14/19 with ventilator dependent respiratory failure due to AECOPD, PNA. Respiratory virus panel was negative for influenza and positive for rhinovirus. Intubated 03/07 and extubated 03/10. Has prior history of O2 dependent COPD (baseline 2 LPM [employer]).   DATA/EVENTS: 06/11/17 CT chest: very severe emphysema. Nonspecific RUL irregular opacity. 9 mm right lower lobe nodule is stable dating back to 11/30/2015 11/23/17 Echocardiogram: LVEF 50-55%, grade I diastolic dysfunction. Mild RV hypokinesis of the apex, RVSP est 45 mmHg 01/22/18 CT chest: Very severe emphysema. New right lower lobe pneumonia. Probable left lower lobe airspace disease is well.  02/26/18 ED visit: CC of LE edema. CTA chest revealed possible PE. LE venous US revealed B DVT 02/26/18 LE venous US: Deep venous thrombosis noted in the right popliteal vein, right posterior tibial vein and left gastroc veins 02/26/18 CTA chest: Findings suspicious for subsegmental pulmonary emboli in the lingula and left lower lobe, motion artifact also considered but felt less likely. Advanced emphysema. Progression in consolidative and nodular opacities in both lower lobes since exam last month. This may be infectious or inflammatory. Given presence of debris in the bronchi, aspiration is considered. Given the nodular appearance, neoplastic process is not excluded, recommend follow-up chest CT in 3 months 05/22/18 CT/PET: Very severe emphysema.  No suspicious hypermetabolic activity within the neck, chest, abdomen or pelvis. The right lower lobe pulmonary nodule is stable, without hypermetabolic activity, consistent with a benign find   CC  SOB and not  feeling well Acute Visit  SUBJ:  +wheezing 5 days +SOB acute on chronic +DOE +productive cough green sputum +chronic hypoxic resp failure  Will give douneb in office now  Has had several rounds of abx and steroids over last 3 months   Smoking Assessment and Cessation Counseling   Upon further questioning, Patient smokes 1/2 ppd  I have advised patient to quit/stop smoking as soon as possible due to high risk for multiple medical problems  Patient  is NOT willing to quit smoking  I have advised patient that we can assist and have options of Nicotine replacement therapy. I also advised patient on behavioral therapy and can provide oral medication therapy in conjunction with the other therapies  Follow up next Office visit  for assessment of smoking cessation  Smoking cessation counseling advised for 4 minutes    Vitals:   10/31/18 1018  BP: 124/80  Pulse: (!) 122  Temp: 98.7 F (37.1 C)  TempSrc: Oral  SpO2: 90%  Weight: 226 lb 12.8 oz (102.9 kg)  Height: 5\' 8"  (1.727 m)  2 LPM     Review of Systems:  Gen:  +fever,  HEENT: Denies blurred vision, double vision, ear pain, eye pain, hearing loss, nose bleeds, sore throat Cardiac:  No dizziness, chest pain or heaviness, chest tightness,edema, No JVD Resp:   + cough, +sputum production, +shortness of breath,+wheezing, -hemoptysis,  Gi: Denies swallowing difficulty, stomach pain, nausea or vomiting, diarrhea, constipation, bowel incontinence Gu:  Denies bladder incontinence, burning urine Ext:   Denies Joint pain, stiffness or swelling Skin: Denies  skin rash, easy bruising or bleeding or hives Endoc:  Denies polyuria, polydipsia , polyphagia or weight change Psych:   Denies depression, insomnia or hallucinations  Other:  All other systems negative  Physical Examination:   GENERAL:NAD, no fevers, chills, no weakness no fatigue HEAD: Normocephalic, atraumatic.  EYES: PERLA, EOMI No scleral icterus.   MOUTH: Moist mucosal membrane.  EAR, NOSE, THROAT: Clear without exudates. No external lesions.  NECK: Supple. No thyromegaly.  No JVD.  PULMONARY: CTA B/L no wheezing, rhonchi, crackles CARDIOVASCULAR: S1 and S2. Regular rate and rhythm. No murmurs GASTROINTESTINAL: Soft, nontender, nondistended. Positive bowel sounds.  MUSCULOSKELETAL: No swelling, clubbing, or edema.  NEUROLOGIC: No gross focal neurological deficits. 5/5 strength all extremities SKIN: No ulceration, lesions, rashes, or cyanosis.  PSYCHIATRIC: Insight, judgment intact. -depression -anxiety ALL OTHER ROS ARE NEGATIVE      DATA:   BMP Latest Ref Rng & Units 05/22/2018 05/22/2018 02/26/2018  Glucose 70 - 99 mg/dL 90 - 86  BUN 6 - 23 mg/dL 16 - 15  Creatinine 2.950.40 - 1.50 mg/dL 6.211.21 3.081.20 6.571.22  Sodium 135 - 145 mEq/L 144 - 143  Potassium 3.5 - 5.1 mEq/L 4.0 - 3.9  Chloride 96 - 112 mEq/L 99 - 98(L)  CO2 19 - 32 mEq/L 33(H) - 35(H)  Calcium 8.4 - 10.5 mg/dL 9.6 - 9.3    CBC Latest Ref Rng & Units 02/26/2018 01/25/2018 01/22/2018  WBC 3.8 - 10.6 K/uL 8.6 14.3(H) 18.5(H)  Hemoglobin 13.0 - 18.0 g/dL 12.6(L) 14.2 16.5  Hematocrit 40.0 - 52.0 % 37.4(L) 43.0 50.6  Platelets 150 - 440 K/uL 241 154 168    CXR 1/29: Chronic findings of hyperinflation and chronic bibasilar atelectasis versus scarring.  No acute findings  I have personally reviewed all chest radiographs reported above including CXRs and CT chest unless otherwise indicated  ACUTE COPD EXACERBATION MOST LIKELY FROM ACUTE BRONCHITIS WITH CHRONIC HYPOXIC RESP FAILURE ON OXYGEN WITH UNDERLYING OSA ON CPAP WITH MORBID OBESITY AND DECONDITIONED STATE  PREDNISONE 40 mg daily for 10 days DOUNEB in office now then every 4 hrs for next 4 days Augmentin 875 mg BID Continue Oxygen as prescribed Smoking cessation advised Continue CPAP as prescribed    Patient  satisfied with Plan of action and management. All questions answered Follow up Dr Sung AmabileSimonds in 2  weeks  Wallis BambergKurian Santiago Gladavid Aryah Doering, M.D.  [facility]  Medical Director [facility] Medical Director [facility] Cardio-Pulmonary Department

## 2018-10-31 NOTE — Patient Instructions (Addendum)
PREDNISONE 40 MG DAILY FOR 10 DAYS  AUGMENTIN 875 MG TWICE DAILY FOR 7 DAYS   DOUNEBS EVERY 4 HRS FOR NEXT 4 DAYS   CONTINUE OXYGEN AS PRESCRIBED

## 2018-11-12 ENCOUNTER — Other Ambulatory Visit: Payer: Self-pay | Admitting: Pharmacy Technician

## 2018-11-13 ENCOUNTER — Ambulatory Visit: Payer: Medicare Other | Admitting: Pulmonary Disease

## 2018-11-15 ENCOUNTER — Encounter: Payer: Self-pay | Admitting: Pulmonary Disease

## 2018-11-15 ENCOUNTER — Ambulatory Visit: Payer: Medicare Other | Admitting: Pulmonary Disease

## 2018-11-15 VITALS — BP 122/78 | HR 110 | Ht 68.0 in | Wt 226.6 lb

## 2018-11-15 DIAGNOSIS — J9611 Chronic respiratory failure with hypoxia: Secondary | ICD-10-CM

## 2018-11-15 DIAGNOSIS — J449 Chronic obstructive pulmonary disease, unspecified: Secondary | ICD-10-CM | POA: Diagnosis not present

## 2018-11-15 DIAGNOSIS — B370 Candidal stomatitis: Secondary | ICD-10-CM

## 2018-11-15 DIAGNOSIS — J441 Chronic obstructive pulmonary disease with (acute) exacerbation: Secondary | ICD-10-CM | POA: Diagnosis not present

## 2018-11-15 MED ORDER — AZITHROMYCIN 250 MG PO TABS: ORAL_TABLET | ORAL | 5 refills | Status: AC

## 2018-11-15 MED ORDER — NYSTATIN 100000 UNIT/ML MT SUSP: 5.0000 mL | Freq: Four times a day (QID) | OROMUCOSAL | 0 refills | Status: AC

## 2018-11-15 NOTE — Patient Instructions (Signed)
Continue oxygen therapy is close to 24 hours/day as possible Continue Dulera, Spiriva inhalers and theophylline as maintenance medications Continue DuoNeb up to every 6 hours as needed for increased wheezing, chest tightness, shortness of breath, cough New therapy: Initiate azithromycin 250 mg every other day For thrush, nystatin mouthwash scribed.  Use 3-4 times a day for next 5 days  Follow-up in 3 months.  Call sooner if needed

## 2018-11-17 ENCOUNTER — Other Ambulatory Visit: Payer: Self-pay | Admitting: Internal Medicine

## 2018-11-17 DIAGNOSIS — I10 Essential (primary) hypertension: Secondary | ICD-10-CM

## 2018-11-17 DIAGNOSIS — J471 Bronchiectasis with (acute) exacerbation: Secondary | ICD-10-CM | POA: Diagnosis not present

## 2018-11-17 DIAGNOSIS — J449 Chronic obstructive pulmonary disease, unspecified: Secondary | ICD-10-CM | POA: Diagnosis not present

## 2018-11-17 MED ORDER — DILTIAZEM HCL 30 MG PO TABS: 30.0000 mg | ORAL_TABLET | Freq: Three times a day (TID) | ORAL | 3 refills | Status: AC | PRN

## 2018-11-18 NOTE — Progress Notes (Signed)
PULMONARY OFFICE FOLLOW-UP NOTE  Requesting MD/Service: Self Date of initial consultation: 12/18/17 Reason for consultation: severe COPD, recent intubation for AECOPD  PT PROFILE: 63 y.o. male smoker (up to 2 PPD previously X 40+ yrs, now occasional, none since recent hospitalization) hospitalized 03/07-03/14/19 with ventilator dependent respiratory failure due to AECOPD, PNA. Respiratory virus panel was negative for influenza and positive for rhinovirus. Intubated 03/07 and extubated 03/10. Has prior history of O2 dependent COPD (baseline 2 LPM [place]).   DATA/EVENTS: 06/11/17 CT chest: very severe emphysema. Nonspecific RUL irregular opacity. 9 mm right lower lobe nodule is stable dating back to 11/30/2015 11/23/17 Echocardiogram: LVEF 50-55%, grade I diastolic dysfunction. Mild RV hypokinesis of the apex, RVSP est 45 mmHg 01/22/18 CT chest: Very severe emphysema. New right lower lobe pneumonia. Probable left lower lobe airspace disease is well.  02/26/18 ED visit: CC of LE edema. CTA chest revealed possible PE. LE venous US revealed B DVT 02/26/18 LE venous US: Deep venous thrombosis noted in the right popliteal vein, right posterior tibial vein and left gastroc veins 02/26/18 CTA chest: Findings suspicious for subsegmental pulmonary emboli in the lingula and left lower lobe, motion artifact also considered but felt less likely. Advanced emphysema. Progression in consolidative and nodular opacities in both lower lobes since exam last month. This may be infectious or inflammatory. Given presence of debris in the bronchi, aspiration is considered. Given the nodular appearance, neoplastic process is not excluded, recommend follow-up chest CT in 3 months 05/22/18 CT/PET: Very severe emphysema.  No suspicious hypermetabolic activity within the neck, chest, abdomen or pelvis. The right lower lobe pulmonary nodule is stable, without hypermetabolic activity, consistent with a benign find   INTERVAL: Last  office visit with me 10/16/18.  Seen in follow-up by Dr. Belia Heman 10/31/18 and treated with another course of prednisone.  SUBJ:  This is a scheduled follow-up visit.  He feels that he is now back to his baseline.  He continues to have cough productive of mostly clear mucus.  He continues to have severe exertional shortness of breath but this is his baseline.  He denies chest tightness and purulent sputum.  He has no fever or hemoptysis.  He has chronic lower extremity edema which is unchanged from baseline.  He remains on Dulera, Spiriva, theophylline as maintenance medications.  He is using these compliantly.  He is using DuoNeb 3-4 times per day.  With the recent antibiotics and prednisone, he is developed thrush underneath his dentures which is causing mouth pain.   Vitals:   11/15/18 1125 11/15/18 1133  BP:  122/78  Pulse:  (!) 110  SpO2:  92%  Weight: 226 lb 9.6 oz (102.8 kg)   Height: 5\' 8"  (1.727 m)   2 LPM [place]  EXAM:  Gen: In wheelchair, no overt respiratory distress HEENT: NCAT, sclerae white.  Mild buccal mucosa erythema but no plaques noted Neck: Supple without lymphadenopathy or JVD Lungs: Breath sounds diminished and coarse with few scattered wheezes Cardiovascular: Regular, no M Abdomen: Obese, soft, NT, NABS Ext: 1-2+ symmetric ankle edema Neuro: No focal deficits noted  DATA:   BMP Latest Ref Rng & Units 05/22/2018 05/22/2018 02/26/2018  Glucose 70 - 99 mg/dL 90 - 86  BUN 6 - 23 mg/dL 16 - 15  Creatinine 6.71 - 1.50 mg/dL 2.45 8.09 9.83  Sodium 135 - 145 mEq/L 144 - 143  Potassium 3.5 - 5.1 mEq/L 4.0 - 3.9  Chloride 96 - 112 mEq/L 99 - 98(L)  CO2 19 -  32 mEq/L 33(H) - 35(H)  Calcium 8.4 - 10.5 mg/dL 9.6 - 9.3    CBC Latest Ref Rng & Units 02/26/2018 01/25/2018 01/22/2018  WBC 3.8 - 10.6 K/uL 8.6 14.3(H) 18.5(H)  Hemoglobin 13.0 - 18.0 g/dL 12.6(L) 14.2 16.5  Hematocrit 40.0 - 52.0 % 37.4(L) 43.0 50.6  Platelets 150 - 440 K/uL 241 154 168    CXR: No new film  I have  personally reviewed all chest radiographs reported above including CXRs and CT chest unless otherwise indicated  IMPRESSION:     ICD-10-CM   1. Chronic hypoxemic respiratory failure (HCC) J96.11   2. COPD, very severe (HCC) J44.9   3. Recent COPD exacerbation, gradually resolving J44.1   4. Oral candidiasis B37.0     PLAN:  Continue oxygen therapy as close to 24 hours/day as possible Continue Dulera, Spiriva inhalers and theophylline as maintenance medications Continue DuoNeb up to every 6 hours as needed for increased wheezing, chest tightness, shortness of breath, cough New therapy: Initiate azithromycin 250 mg every other day For thrush, nystatin mouthwash prescribed.  Use 3-4 times a day for next 5 days  Follow-up in 3 months.  Call sooner if needed   Billy Fischer, MD PCCM service Mobile [phone] Pager [phone] 11/18/2018 10:08 AM

## 2018-11-21 ENCOUNTER — Telehealth: Payer: Self-pay

## 2018-11-21 ENCOUNTER — Other Ambulatory Visit: Payer: Self-pay | Admitting: Internal Medicine

## 2018-11-21 DIAGNOSIS — Z1389 Encounter for screening for other disorder: Secondary | ICD-10-CM

## 2018-11-21 DIAGNOSIS — E559 Vitamin D deficiency, unspecified: Secondary | ICD-10-CM

## 2018-11-21 DIAGNOSIS — I10 Essential (primary) hypertension: Secondary | ICD-10-CM

## 2018-11-21 NOTE — Telephone Encounter (Signed)
Copied from CRM [id number]. Topic: General - Other >> Nov 21, 2018  9:07 AM Wyonia Hough E wrote: Reason for CRM: Pt is suppose to be coming in. He has been on a lot of antibiotics that are hard on liver and kidneys and daughter(Caroline) wants to know if blood work can be done at his next appt because of being on a lot of antibiotics and to help him from having to travel so many times. The Pt would need to know if he needs to be fasting or not/ please advise

## 2018-11-21 NOTE — Telephone Encounter (Signed)
Does not need to be fasting this time will do labs at f/u  Inform patient

## 2018-11-22 NOTE — Telephone Encounter (Signed)
Left detailed message for patient. PEC may give information if patient returns call.Marland Kitchen

## 2018-11-22 NOTE — Telephone Encounter (Signed)
Does not need to be fasting this time will do labs at f/u  Inform patient  

## 2018-12-04 ENCOUNTER — Telehealth: Payer: Self-pay | Admitting: Pulmonary Disease

## 2018-12-04 MED ORDER — AMBULATORY NON FORMULARY MEDICATION: 1.0000 | 0 refills | Status: AC | PRN

## 2018-12-04 NOTE — Telephone Encounter (Addendum)
Placed prescription for "so clean" device. Will have Dr. Sung Amabile sign order for pick. Will call patient when ready.

## 2018-12-05 NOTE — Telephone Encounter (Signed)
Spoke to patient, RX ready to be picked up. He will send his daughter to get it tomorrow. Nothing further needed at this time.

## 2018-12-06 ENCOUNTER — Encounter: Payer: Self-pay | Admitting: Internal Medicine

## 2018-12-06 ENCOUNTER — Other Ambulatory Visit: Payer: Self-pay

## 2018-12-06 ENCOUNTER — Ambulatory Visit (INDEPENDENT_AMBULATORY_CARE_PROVIDER_SITE_OTHER): Payer: Medicare Other | Admitting: Internal Medicine

## 2018-12-06 VITALS — BP 110/70 | HR 108 | Temp 98.3°F | Ht 70.0 in | Wt 227.8 lb

## 2018-12-06 DIAGNOSIS — R252 Cramp and spasm: Secondary | ICD-10-CM | POA: Diagnosis not present

## 2018-12-06 DIAGNOSIS — J309 Allergic rhinitis, unspecified: Secondary | ICD-10-CM

## 2018-12-06 DIAGNOSIS — H6120 Impacted cerumen, unspecified ear: Secondary | ICD-10-CM

## 2018-12-06 DIAGNOSIS — J441 Chronic obstructive pulmonary disease with (acute) exacerbation: Secondary | ICD-10-CM | POA: Diagnosis not present

## 2018-12-06 DIAGNOSIS — R682 Dry mouth, unspecified: Secondary | ICD-10-CM

## 2018-12-06 DIAGNOSIS — H6122 Impacted cerumen, left ear: Secondary | ICD-10-CM

## 2018-12-06 DIAGNOSIS — G4700 Insomnia, unspecified: Secondary | ICD-10-CM

## 2018-12-06 LAB — COMPREHENSIVE METABOLIC PANEL
ALT: 13 U/L (ref 0–53)
AST: 13 U/L (ref 0–37)
Albumin: 4.1 g/dL (ref 3.5–5.2)
Alkaline Phosphatase: 72 U/L (ref 39–117)
BUN: 20 mg/dL (ref 6–23)
CO2: 39 meq/L — AB (ref 19–32)
Calcium: 9.6 mg/dL (ref 8.4–10.5)
Chloride: 102 mEq/L (ref 96–112)
Creatinine, Ser: 1.32 mg/dL (ref 0.40–1.50)
GFR: 54.47 mL/min — ABNORMAL LOW (ref >60.00)
Glucose, Bld: 92 mg/dL (ref 70–99)
Potassium: 5.1 mEq/L (ref 3.5–5.1)
Sodium: 146 mEq/L — ABNORMAL HIGH (ref 135–145)
Total Bilirubin: 0.4 mg/dL (ref 0.2–1.2)
Total Protein: 6.9 g/dL (ref 6.0–8.3)

## 2018-12-06 LAB — MAGNESIUM: Magnesium: 2.2 mg/dL (ref 1.5–2.5)

## 2018-12-06 MED ORDER — FLUTICASONE PROPIONATE 50 MCG/ACT NA SUSP: 2.0000 | Freq: Every day | NASAL | 11 refills | Status: AC | PRN

## 2018-12-06 MED ORDER — LEVOFLOXACIN 750 MG PO TABS: 750.0000 mg | ORAL_TABLET | Freq: Every day | ORAL | 0 refills | Status: DC

## 2018-12-06 MED ORDER — PREDNISONE 20 MG PO TABS: 20.0000 mg | ORAL_TABLET | Freq: Every day | ORAL | 0 refills | Status: DC

## 2018-12-06 MED ORDER — CARBAMIDE PEROXIDE 6.5 % OT SOLN: 5.0000 [drp] | Freq: Two times a day (BID) | OTIC | 11 refills | Status: AC

## 2018-12-06 MED ORDER — BIOTENE DRY MOUTH MT LIQD: 15.0000 mL | Freq: Every day | OROMUCOSAL | 12 refills | Status: AC | PRN

## 2018-12-06 MED ORDER — TRAZODONE HCL 50 MG PO TABS: 50.0000 mg | ORAL_TABLET | Freq: Every evening | ORAL | 0 refills | Status: DC | PRN

## 2018-12-06 NOTE — Progress Notes (Signed)
Chief Complaint  Patient presents with  . Follow-up    4 mo   F/u with daughter Dustin [name] today  1. COPD with exacerbation sob with exertion worse, cough O2 with exertion was 84% on 2L he normally turns O2 up to 3.5 L at home after rest O2 90%  -he'ed recently been tx'ed with augmentin, azm now on 250 mg azm qod per Dr. [name] also on [facility], spiriva, theophylline   He c/o nasal congestion and hard to breath through nose at times and O2 is not humidified he was told it could not be  2. Insomnia trouble falling asleep at night though on xanax 1 mg tid prn  3. C/o dry mouth  4. C/o muscle cramps in hands baclofen stopped 5 mg did not help  5. C/o itching ears and clear drainage at times from ears    Review of Systems  Constitutional: Negative for weight loss.  HENT: Negative for hearing loss.        Ears itching Dry mouth    Eyes: Negative for blurred vision.  Respiratory: Positive for cough and shortness of breath. Negative for sputum production and wheezing.   Cardiovascular: Negative for chest pain.  Gastrointestinal: Negative for abdominal pain.  Skin: Negative for rash.  Neurological: Negative for headaches.  Psychiatric/Behavioral: The patient has insomnia.    Past Medical History:  Diagnosis Date  . Allergy   . Anxiety   . Asthma   . Chronic diastolic CHF (congestive heart failure) ([place])    a. echo 07/2013: EF 60-65%, DD, biatrial dilatation, Ao sclerosis, dilated RV, moderate pulmonary HTN, elevated CV and RA pressures; b. patient reported echo at Dr. Laurelyn Sickle office 02/2015 - his office does not have record of him being a pt there c. echo 11/2015: EF 60-65%, Grade 1 DD, mod-severe pulm pressures  . Chronic respiratory failure (HCC)    a. on 2L via nasal cannula; b. secondary to COPD  . COPD (chronic obstructive pulmonary disease) (HCC)    on 2L continuous   . Depression   . Emphysema of lung ([place])   . GERD (gastroesophageal reflux disease)   . HCAP (healthcare-associated  pneumonia)    01/22/18-01/28/18   . Headache   . Hypertension   . Leg DVT (deep venous thromboembolism), acute, bilateral ([place])    02/26/18  . Personal history of tobacco use, presenting hazards to health 08/17/2015  . Pulmonary embolism ([place])    02/26/18  . Pulmonary HTN ([place])   . Tobacco abuse    Past Surgical History:  Procedure Laterality Date  . ABDOMINAL SURGERY    . ADENOIDECTOMY    . CARPAL TUNNEL RELEASE Right   . corpal tunnel Right   . ELBOW SURGERY Right    repaired tendon  . hemmorhoid N/A   . HEMORRHOID SURGERY    . NASAL SINUS SURGERY    . NASAL SINUS SURGERY     1970s  . NOSE SURGERY    . reflux surgery     1994  . ROTATOR CUFF REPAIR Right   . SHOULDER SURGERY    . TENNIS ELBOW RELEASE/NIRSCHEL PROCEDURE Right   . TONSILLECTOMY    . URETHRA SURGERY     surgery 6 times from age [age] yrs old  . URETHRA SURGERY     Family History  Problem Relation Age of Onset  . CAD Father   . Hyperlipidemia Father   . Stroke Father   . Heart disease Father   . Arthritis Father   .  Hearing loss Father   . Hypertension Father   . Hypertension Mother   . Peripheral Artery Disease Mother   . Rheum arthritis Mother   . Asthma Mother   . Bipolar disorder Mother   . Depression Mother   . Malignant hypertension Mother   . Arthritis Mother   . Hearing loss Mother   . Heart disease Mother   . Hyperlipidemia Mother   . Kidney disease Mother   . Birth defects Brother   . Heart disease Brother   . Alcohol abuse Daughter   . Arthritis Daughter   . Asthma Daughter   . Depression Daughter   . Drug abuse Daughter   . Miscarriages / [place] Daughter   . Intellectual disability Daughter   . Kidney disease Son   . Birth defects Paternal Grandmother   . Depression Paternal Grandmother   . Heart disease Paternal Grandmother   . Birth defects Sister   . Diabetes Neg Hx    Social History   Socioeconomic History  . Marital status: Legally Separated    Spouse name: Not  on file  . Number of children: Not on file  . Years of education: Not on file  . Highest education level: Not on file  Occupational History  . Not on file  Social Needs  . Financial resource strain: Not hard at all  . Food insecurity:    Worry: Never true    Inability: Never true  . Transportation needs:    Medical: No    Non-medical: No  Tobacco Use  . Smoking status: Former Smoker    Packs/day: 2.00    Years: 40.00    Pack years: 80.00    Types: Cigarettes    Last attempt to quit: 01/14/2018    Years since quitting: 0.8  . Smokeless tobacco: Never Used  Substance and Sexual Activity  . Alcohol use: No  . Drug use: No  . Sexual activity: Not Currently  Lifestyle  . Physical activity:    Days per week: 0 days    Minutes per session: 0 min  . Stress: Only a little  Relationships  . Social connections:    Talks on phone: Not on file    Gets together: Not on file    Attends religious service: Not on file    Active member of club or organization: Not on file    Attends meetings of clubs or organizations: Not on file    Relationship status: Separated  . Intimate partner violence:    Fear of current or ex partner: Not on file    Emotionally abused: Not on file    Physically abused: Not on file    Forced sexual activity: Not on file  Other Topics Concern  . Not on file  Social History Narrative   On 2 L chronic home O2.  Lives at home alone    3 kids    No guns    Wears seat belt    Safe in relationship    Current Meds  Medication Sig  . ALPRAZolam (XANAX) 1 MG tablet Take 1 tablet (1 mg total) by mouth 3 (three) times daily as needed for anxiety.  . AMBULATORY NON FORMULARY MEDICATION 1 each by Does not apply route as needed. Please provide patient with "So Clean" CPAP cleaning device to be used as needed.  Marland Kitchen apixaban (ELIQUIS) 5 MG TABS tablet Take 1 tablet (5 mg total) by mouth 2 (two) times daily.  . Baclofen 5  MG TABS Take 1 tablet by mouth 2 (two) times daily  as needed (muscle spasms).  . carbamide peroxide (DEBROX) 6.5 % OTIC solution Place 5 drops into both ears 2 (two) times daily. Prn x 4-7 days  . Cholecalciferol (VITAMIN D) 2000 units tablet Take 2,000 Units by mouth daily.  . clobetasol (TEMOVATE) 0.05 % external solution Apply 1 application topically 2 (two) times daily. Prn to scalp and ears  . diltiazem (CARDIZEM) 30 MG tablet Take 1 tablet (30 mg total) by mouth 3 (three) times daily as needed.  Marland Kitchen escitalopram (LEXAPRO) 20 MG tablet Take 1 tablet (20 mg total) by mouth at bedtime.  . furosemide (LASIX) 40 MG tablet Take 1 tablet (40 mg total) by mouth daily. In am  . gabapentin (NEURONTIN) 300 MG capsule TAKE 1 TO 2 CAPSULES BY MOUTH THREE TIMES DAILY  . hydrocortisone 2.5 % cream Apply topically 2 (two) times daily as needed. face  . ipratropium-albuterol (DUONEB) 0.5-2.5 (3) MG/3ML SOLN Take 3 mLs by nebulization every 6 (six) hours as needed.  Marland Kitchen ketoconazole (NIZORAL) 2 % shampoo Apply 1 application topically 2 (two) times a week. Let stand for 5 minutes  . magnesium oxide (MAG-OX) 400 MG tablet Take 400 mg by mouth daily.  . mometasone-formoterol (DULERA) 200-5 MCG/ACT AERO Inhale 2 puffs into the lungs 2 (two) times daily.  . mupirocin ointment (BACTROBAN) 2 % Apply 1 application topically 2 (two) times daily. Use left upper arm  And left ear x 1-2 weeks  . nystatin (MYCOSTATIN) 100000 UNIT/ML suspension Use as directed 5 mLs (500,000 Units total) in the mouth or throat 4 (four) times daily. Swish and spit 3 or 4 times daily for at least 5 days.  Then may be used as needed thereafter for thrush  . potassium chloride (K-DUR,KLOR-CON) 10 MEQ tablet Take 1-2 tablets (10-20 mEq total) by mouth daily. Repeat if second LASIX taken  . Probiotic Product (PROBIOTIC PO) Take by mouth.  Marland Kitchen rOPINIRole (REQUIP) 0.25 MG tablet Take 1 tablet (0.25 mg total) by mouth 2 (two) times daily.  Marland Kitchen Spacer/Aero-Holding Chambers (OPTICHAMBER ADVANTAGE-LG MASK)  MISC Use as directed with inhaler diag  j44.1  . theophylline (UNIPHYL) 400 MG 24 hr tablet Take 1 tablet (400 mg total) by mouth every morning.  . Tiotropium Bromide Monohydrate (SPIRIVA RESPIMAT) 2.5 MCG/ACT AERS Inhale 2 puffs into the lungs daily.   Allergies  Allergen Reactions  . Asa [Aspirin] Other (See Comments)    Reaction: swelling of the right side.  . Bee Venom Swelling  . Codeine Other (See Comments)    Reaction: Difficulty breathing  . Ibuprofen Other (See Comments)    Reaction: Swelling of the right side.  . Iodinated Diagnostic Agents Other (See Comments)    Reaction:  Unknown  Other reaction(s): Unknown   No results found for this or any previous visit (from the past 2160 hour(s)). Objective  Body mass index is 32.69 kg/m. Wt Readings from Last 3 Encounters:  12/06/18 227 lb 12.8 oz (103.3 kg)  11/15/18 226 lb 9.6 oz (102.8 kg)  10/31/18 226 lb 12.8 oz (102.9 kg)   Temp Readings from Last 3 Encounters:  12/06/18 98.3 F (36.8 C) (Oral)  10/31/18 98.7 F (37.1 C) (Oral)  08/07/18 98.5 F (36.9 C) (Oral)   BP Readings from Last 3 Encounters:  12/06/18 110/70  11/15/18 122/78  10/31/18 124/80   Pulse Readings from Last 3 Encounters:  12/06/18 (!) 108  11/15/18 (!) 110  10/31/18 (!)  122    Physical Exam Vitals signs and nursing note reviewed.  Constitutional:      Appearance: Normal appearance. He is well-developed and well-groomed.  HENT:     Head: Normocephalic and atraumatic.     Ears:     Comments: Left ear with cerumen and scale      Nose: Nose normal.     Mouth/Throat:     Mouth: Mucous membranes are moist.     Pharynx: Oropharynx is clear.  Eyes:     Conjunctiva/sclera: Conjunctivae normal.     Pupils: Pupils are equal, round, and reactive to light.  Cardiovascular:     Rate and Rhythm: Normal rate and regular rhythm.     Heart sounds: Normal heart sounds.  Pulmonary:     Effort: Pulmonary effort is normal.     Breath sounds:  Normal breath sounds.  Skin:    General: Skin is warm and dry.  Neurological:     General: No focal deficit present.     Mental Status: He is alert and oriented to person, place, and time. Mental status is at baseline.     Gait: Gait normal.     Comments: Rolling walker today   Psychiatric:        Attention and Perception: Attention and perception normal.        Mood and Affect: Mood and affect normal.        Speech: Speech normal.        Behavior: Behavior normal. Behavior is cooperative.        Thought Content: Thought content normal.        Cognition and Memory: Cognition and memory normal.        Judgment: Judgment normal.     Assessment   1. Copd exacerbation  2. Allergic rhinitis  3. Insomnia  4. Dry mouth  5. Muscle cramps  6. Left cerumen in ear and psoriasis vs seb derm in scalp and ears  7. HM Plan  1. levaquin 750 qd x 5 days with azm 250 qod continue  Prednisone 20 mg qd x 7-10 days  2. flonase add 3. Add trazadone 50-100 mg qhs prn  4. Biotene mouthwash 5. Check CMET, mag today  6. Debrox left ear  Prn Temovate to scalp and ears prn  7.  Flu shot utd  Tdap, prevnar check prior PCP records Dr. Clayborn Bigness pna 23 had 01/16/18  Consider shingrix in future Immune MMR  Consider check hep B, testosterone at f/u labs  Hep C neg cologaurd neg 08/29/18 PSA normal 05/22/18  Smoker age 29 to 22 max 2 ppd quit 4/2019now smoking 2 cig per day noFHlung cancer Provider: Dr. Olivia Mackie McLean-Scocuzza-Internal Medicine

## 2018-12-06 NOTE — Patient Instructions (Addendum)
Biotene mouthwash for dry mouth   Warm milk for sleep Melatonin for sleep 3-5 mg at night   levaquin 750 mg x 5 days  Prednisone 20 mg in am x 1 week to 10 days   theraworx for muscle cramps   Debrox into left ear for ear wax   Muscle Cramps and Spasms Muscle cramps and spasms are when muscles tighten by themselves. They usually get better within minutes. Muscle cramps are painful. They are usually stronger and last longer than muscle spasms. Muscle spasms may or may not be painful. They can last a few seconds or much longer. Cramps and spasms can affect any muscle, but they occur most often in the calf muscles of the leg. They are usually not caused by a serious problem. In many cases, the cause is not known. Some common causes include:  Doing more physical work or exercise than your body is ready for.  Using the muscles too much (overuse) by repeating certain movements too many times.  Staying in a certain position for a long time.  Playing a sport or doing an activity without preparing properly.  Using bad form or technique while playing a sport or doing an activity.  Not having enough water in your body (dehydration).  Injury.  Side effects of some medicines.  Low levels of the salts and minerals in your blood (electrolytes), such as low potassium or calcium. Follow these instructions at home: Managing pain and stiffness      Massage, stretch, and relax the muscle. Do this for many minutes at a time.  If told, put heat on tight or tense muscles as often as told by your doctor. Use the heat source that your doctor recommends, such as a moist heat pack or a heating pad. ? Place a towel between your skin and the heat source. ? Leave the heat on for 20-30 minutes. ? Remove the heat if your skin turns bright red. This is very important if you are not able to feel pain, heat, or cold. You may have a greater risk of getting burned.  If told, put ice on the affected area.  This may help if you are sore or have pain after a cramp or spasm. ? Put ice in a plastic bag. ? Place a towel between your skin and the bag. ? Leave the ice on for 20 minutes, 2-3 times a day.  Try taking hot showers or baths to help relax tight muscles. Eating and drinking  Drink enough fluid to keep your pee (urine) pale yellow.  Eat a healthy diet to help ensure that your muscles work well. This should include: ? Fruits and vegetables. ? Lean protein. ? Whole grains. ? Low-fat or nonfat dairy products. General instructions  If you are having cramps often, avoid intense exercise for several days.  Take over-the-counter and prescription medicines only as told by your doctor.  Watch for any changes in your symptoms.  Keep all follow-up visits as told by your doctor. This is important. Contact a doctor if:  Your cramps or spasms get worse or happen more often.  Your cramps or spasms do not get better with time. Summary  Muscle cramps and spasms are when muscles tighten by themselves. They usually get better within minutes.  Cramps and spasms occur most often in the calf muscles of the leg.  Massage, stretch, and relax the muscle. This may help the cramp or spasm go away.  Drink enough fluid to keep your  pee (urine) pale yellow. This information is not intended to replace advice given to you by your health care provider. Make sure you discuss any questions you have with your health care provider. Document Released: 08/17/2008 Document Revised: 01/28/2018 Document Reviewed: 01/28/2018 Elsevier Interactive Patient Education  2019 [employer] Inc[employer]    Leg Cramps Leg cramps occur when one or more muscles tighten and you have no control over this tightening (involuntary muscle contraction). Muscle cramps can develop in any muscle, but the most common place is in the calf muscles of the leg. Those cramps can occur during exercise or when you are at rest. Leg cramps are painful, and  they may last for a few seconds to a few minutes. Cramps may return several times before they finally stop. Usually, leg cramps are not caused by a serious medical problem. In many cases, the cause is not known. Some common causes include:  Excessive physical effort (overexertion), such as during intense exercise.  Overuse from repetitive motions, or doing the same thing over and over.  Staying in a certain position for a long period of time.  Improper preparation, form, or technique while performing a sport or an activity.  Dehydration.  Injury.  Side effects of certain medicines.  Abnormally low levels of minerals in your blood (electrolytes), especially potassium and calcium. This could result from: ? Pregnancy. ? Taking diuretic medicines. Follow these instructions at home: Eating and drinking  Drink enough fluid to keep your urine pale yellow. Staying hydrated may help prevent cramps.  Eat a healthy diet that includes plenty of nutrients to help your muscles function. A healthy diet includes fruits and vegetables, lean protein, whole grains, and low-fat or nonfat dairy products. Managing pain, stiffness, and swelling      Try massaging, stretching, and relaxing the affected muscle. Do this for several minutes at a time.  If directed, put ice on areas that are sore or painful after a cramp: ? Put ice in a plastic bag. ? Place a towel between your skin and the bag. ? Leave the ice on for 20 minutes, 2-3 times a day.  If directed, apply heat to muscles that are tense or tight. Do this before you exercise, or as often as told by your health care provider. Use the heat source that your health care provider recommends, such as a moist heat pack or a heating pad. ? Place a towel between your skin and the heat source. ? Leave the heat on for 20-30 minutes. ? Remove the heat if your skin turns bright red. This is especially important if you are unable to feel pain, heat, or cold.  You may have a greater risk of getting burned.  Try taking hot showers or baths to help relax tight muscles. General instructions  If you are having frequent leg cramps, avoid intense exercise for several days.  Take over-the-counter and prescription medicines only as told by your health care provider.  Keep all follow-up visits as told by your health care provider. This is important. Contact a health care provider if:  Your leg cramps get more severe or more frequent, or they do not improve over time.  Your foot becomes cold, numb, or blue. Summary  Muscle cramps can develop in any muscle, but the most common place is in the calf muscles of the leg.  Leg cramps are painful, and they may last for a few seconds to a few minutes.  Usually, leg cramps are not caused by a  serious medical problem. Often, the cause is not known.  Stay hydrated and take over-the-counter and prescription medicines only as told by your health care provider. This information is not intended to replace advice given to you by your health care provider. Make sure you discuss any questions you have with your health care provider. Document Released: 10/12/2004 Document Revised: 06/14/2017 Document Reviewed: 06/14/2017 Elsevier Interactive Patient Education  2019 Else[employer] Inc.  Insomnia Insomnia is a sleep disorder that makes it difficult to fall asleep or stay asleep. Insomnia can cause fatigue, low energy, difficulty concentrating, mood swings, and poor performance at work or school. There are three different ways to classify insomnia:  Difficulty falling asleep.  Difficulty staying asleep.  Waking up too early in the morning. Any type of insomnia can be long-term (chronic) or short-term (acute). Both are common. Short-term insomnia usually lasts for three months or less. Chronic insomnia occurs at least three times a week for longer than three months. What are the causes? Insomnia may be caused by another  condition, situation, or substance, such as:  Anxiety.  Certain medicines.  Gastroesophageal reflux disease (GERD) or other gastrointestinal conditions.  Asthma or other breathing conditions.  Restless legs syndrome, sleep apnea, or other sleep disorders.  Chronic pain.  Menopause.  Stroke.  Abuse of alcohol, tobacco, or illegal drugs.  Mental health conditions, such as depression.  Caffeine.  Neurological disorders, such as Alzheimer's disease.  An overactive thyroid (hyperthyroidism). Sometimes, the cause of insomnia may not be known. What increases the risk? Risk factors for insomnia include:  Gender. Women are affected more often than men.  Age. Insomnia is more common as you get older.  Stress.  Lack of exercise.  Irregular work schedule or working night shifts.  Traveling between different time zones.  Certain medical and mental health conditions. What are the signs or symptoms? If you have insomnia, the main symptom is having trouble falling asleep or having trouble staying asleep. This may lead to other symptoms, such as:  Feeling fatigued or having low energy.  Feeling nervous about going to sleep.  Not feeling rested in the morning.  Having trouble concentrating.  Feeling irritable, anxious, or depressed. How is this diagnosed? This condition may be diagnosed based on:  Your symptoms and medical history. Your health care provider may ask about: ? Your sleep habits. ? Any medical conditions you have. ? Your mental health.  A physical exam. How is this treated? Treatment for insomnia depends on the cause. Treatment may focus on treating an underlying condition that is causing insomnia. Treatment may also include:  Medicines to help you sleep.  Counseling or therapy.  Lifestyle adjustments to help you sleep better. Follow these instructions at home: Eating and drinking   Limit or avoid alcohol, caffeinated beverages, and cigarettes,  especially close to bedtime. These can disrupt your sleep.  Do not eat a large meal or eat spicy foods right before bedtime. This can lead to digestive discomfort that can make it hard for you to sleep. Sleep habits   Keep a sleep diary to help you and your health care provider figure out what could be causing your insomnia. Write down: ? When you sleep. ? When you wake up during the night. ? How well you sleep. ? How rested you feel the next day. ? Any side effects of medicines you are taking. ? What you eat and drink.  Make your bedroom a dark, comfortable place where it is easy to fall  asleep. ? Put up shades or blackout curtains to block light from outside. ? Use a white noise machine to block noise. ? Keep the temperature cool.  Limit screen use before bedtime. This includes: ? Watching TV. ? Using your smartphone, tablet, or computer.  Stick to a routine that includes going to bed and waking up at the same times every day and night. This can help you fall asleep faster. Consider making a quiet activity, such as reading, part of your nighttime routine.  Try to avoid taking naps during the day so that you sleep better at night.  Get out of bed if you are still awake after 15 minutes of trying to sleep. Keep the lights down, but try reading or doing a quiet activity. When you feel sleepy, go back to bed. General instructions  Take over-the-counter and prescription medicines only as told by your health care provider.  Exercise regularly, as told by your health care provider. Avoid exercise starting several hours before bedtime.  Use relaxation techniques to manage stress. Ask your health care provider to suggest some techniques that may work well for you. These may include: ? Breathing exercises. ? Routines to release muscle tension. ? Visualizing peaceful scenes.  Make sure that you drive carefully. Avoid driving if you feel very sleepy.  Keep all follow-up visits as told  by your health care provider. This is important. Contact a health care provider if:  You are tired throughout the day.  You have trouble in your daily routine due to sleepiness.  You continue to have sleep problems, or your sleep problems get worse. Get help right away if:  You have serious thoughts about hurting yourself or someone else. If you ever feel like you may hurt yourself or others, or have thoughts about taking your own life, get help right away. You can go to your nearest emergency department or call:  Your local emergency services (911 in the [place]  A suicide crisis helpline, such as the National Suicide Prevention Lifeline at (484)120-5469. This is open 24 hours a day. Summary  Insomnia is a sleep disorder that makes it difficult to fall asleep or stay asleep.  Insomnia can be long-term (chronic) or short-term (acute).  Treatment for insomnia depends on the cause. Treatment may focus on treating an underlying condition that is causing insomnia.  Keep a sleep diary to help you and your health care provider figure out what could be causing your insomnia. This information is not intended to replace advice given to you by your health care provider. Make sure you discuss any questions you have with your health care provider. Document Released: 09/01/2000 Document Revised: 06/14/2017 Document Reviewed: 06/14/2017 Elsevier Interactive Patient Education  2019 [employer].

## 2018-12-18 DIAGNOSIS — J471 Bronchiectasis with (acute) exacerbation: Secondary | ICD-10-CM | POA: Diagnosis not present

## 2018-12-18 DIAGNOSIS — J449 Chronic obstructive pulmonary disease, unspecified: Secondary | ICD-10-CM | POA: Diagnosis not present

## 2018-12-19 ENCOUNTER — Other Ambulatory Visit: Payer: Self-pay | Admitting: Pharmacist

## 2018-12-19 NOTE — Patient Outreach (Signed)
[name]) Care Management  12/19/2018  Dustin [name] 10/14/1955 [id number]  Received message from PCP Dr. Terese Door regarding potential for Eliquis patient assistance for this patient. Communicated with [name], CPhT, who has been working with the patient chronically on prescription assistance.   Eliquis assistance through [employer] requires that the patient spend ~3% of their yearly household income on prescriptions before approval. He met that out of pocket spend requirement in 2019 due to copays of Eliquis in combination with inhalers.   For 2020, [name] enrolled the patient in Dulera/Proventil assistance from [employer], which does not require an out of pocket spend. She also applied for Spiriva through [employer], however, as the patient has partial Medicare Extra Help/Low Income Subsidy, he was only given a temporary 90 day supply. [name] plans to submit an appeal, once the patient has filled the Spiriva once, to show his high copay to [employer].   However, as he has not been needing to pay for inhalers this year, it will take longer to meet the required out of pocket spend to qualify for Eliquis prescription assistance.   Contacted patient; left HIPAA compliant message to call me at his convenience. Will plan to discuss his current out of pocket spend and if he has needed to fill Spiriva yet at the pharmacy, as the next step for full Spiriva prescription assistance.   Catie Darnelle Maffucci, PharmD, [place] PGY2 Ambulatory Care Pharmacy Resident, [facility] [facility] Phone: [phone]

## 2018-12-20 ENCOUNTER — Other Ambulatory Visit: Payer: Self-pay | Admitting: Pharmacist

## 2018-12-20 NOTE — Patient Outreach (Signed)
[name]) Care Management  12/20/2018  Dustin [name] 1956-01-27 [id number]   Received call from patient, HIPAA verifiers identified. He noted that he believes he filled Spiriva at the pharmacy, and the copay was ~$20. A print out from the pharmacy showing his copay would be required for the Spiriva appeal to [employer].   Will route letter to Lilla Shook, CPhT to mail to patient to get an out of pocket spend print out from the pharmacy. Patient verbalized understanding about this next step.   Catie Feliz Beam, PharmD, CPP PGY2 Ambulatory Care Pharmacy Resident, [name] Phone: [phone]

## 2019-01-06 ENCOUNTER — Other Ambulatory Visit: Payer: Self-pay | Admitting: Pharmacist

## 2019-01-06 ENCOUNTER — Telehealth: Payer: Self-pay | Admitting: Internal Medicine

## 2019-01-06 NOTE — Telephone Encounter (Signed)
Thanks or please mail a letter explaining what he needs to do  TMS

## 2019-01-06 NOTE — Patient Outreach (Signed)
[name]) Care Management  01/06/2019  ZAYVIEN DUBICKI 1955/12/22 [id number]   Received call from patient regarding patient assistance process. He noted that he received the Spiriva application in the mail, but was concerned about needing his Eliquis.   Returned patient's call. Was unable to leave a message as his voicemail was not set up.   Plan:  - If I have not heard back from patient in 2-3 business days, will call back.   Catie Feliz Beam, PharmD, CPP PGY2 Ambulatory Care Pharmacy Resident, [name] Phone: [phone]

## 2019-01-06 NOTE — Telephone Encounter (Signed)
See [facility] phone note dated today. Attempted to call patient back to explain that he will need to spend a certain amount out of pocket prior to being approved for Eliquis from [employer]. Was unable to leave message. If I don't hear back from him, I will try back later this week.   Catie Feliz Beam, PharmD, CPP PGY2 Ambulatory Care Pharmacy Resident, [name] Phone: [phone]

## 2019-01-06 NOTE — Telephone Encounter (Signed)
Patient called and would like someone to call him back to discuss his blood thinner. He states it is to expensive for him to afford. [phone] (mobile)

## 2019-01-06 NOTE — Telephone Encounter (Signed)
See phone note stating blood thinner too expensive   TMS

## 2019-01-08 ENCOUNTER — Other Ambulatory Visit: Payer: Self-pay | Admitting: Pharmacist

## 2019-01-08 ENCOUNTER — Other Ambulatory Visit: Payer: Self-pay | Admitting: Pulmonary Disease

## 2019-01-08 MED ORDER — TIOTROPIUM BROMIDE MONOHYDRATE 2.5 MCG/ACT IN AERS: 2.0000 | INHALATION_SPRAY | Freq: Every day | RESPIRATORY_TRACT | 3 refills | Status: AC

## 2019-01-08 NOTE — Telephone Encounter (Signed)
Received Rx request from [facility] for spiriva 2.5. Pt last seen 11/15/18 and was instructed to continue this medication.  Rx for spiriva has been sent to preferred pharmacy.  Nothing further is needed.

## 2019-01-08 NOTE — Patient Outreach (Signed)
[name]) Care Management  01/08/2019  Dustin [name] Dec 17, 1955 [id number]  Received return call from patient. Explained that for the Spiriva application, we will need proof of what he spent on the medication, which he can get from his pharmacy.    He asked about Eliquis assistance. Explained that he needs to spend 3% of his household income on prescription medications prior to approval. Advised him to let Ok Edwards, CPhT know when he has spend $300-400 out of pocket on copays, and we will start the Eliquis patient assistance application.   Will route to Lilla Shook, CPhT and PCP Dr. Judie Grieve for follow up.   Catie Feliz Beam, PharmD, CPP PGY2 Ambulatory Care Pharmacy Resident, [name] Phone: [phone]

## 2019-01-10 ENCOUNTER — Other Ambulatory Visit: Payer: Self-pay | Admitting: Pharmacy Technician

## 2019-01-10 NOTE — Patient Outreach (Signed)
[name]) Care Management  01/10/2019  JAKEEL YOU 16-May-1956 [id number]   Successful call to Mr. Charter, HIPAA identifiers verified. Contacted Mr. Gabhart to clarify a couple items for patient assistance that he has spoken to myself and [facility] Embedded RPh Catie Feliz Beam about.  Reviewed with him that his Spiriva Respimat had only been approved thru [employer] temporarily and that company needs [place] to submit proof of his co-pay for that medication. He stated he would go get a printout from pharmacy and confirms that he received the envelope that I had mailed to him and return it in.  He questioned applying for Eliquis. I informed him again that because of the Temporary approval as well as approval for [facility] and Proventil that it might take him longer to reach the 3% OOP spend to apply. I told him that when he sends me the printout for the Spiriva, I can review the document to see where he is with spending to see if we can possibly apply for the Eliquis. He stated he understood.  Will route note to Catie to inform.  Suzan Slick Effie Shy CPhT Certified Pharmacy Technician [name] Care Management Direct Dial:[phone]

## 2019-01-14 ENCOUNTER — Other Ambulatory Visit: Payer: Self-pay | Admitting: Internal Medicine

## 2019-01-14 DIAGNOSIS — G4700 Insomnia, unspecified: Secondary | ICD-10-CM

## 2019-01-14 MED ORDER — TRAZODONE HCL 50 MG PO TABS: 50.0000 mg | ORAL_TABLET | Freq: Every evening | ORAL | 11 refills | Status: AC | PRN

## 2019-01-17 DIAGNOSIS — J471 Bronchiectasis with (acute) exacerbation: Secondary | ICD-10-CM | POA: Diagnosis not present

## 2019-01-17 DIAGNOSIS — J449 Chronic obstructive pulmonary disease, unspecified: Secondary | ICD-10-CM | POA: Diagnosis not present

## 2019-01-31 ENCOUNTER — Telehealth: Payer: Self-pay

## 2019-01-31 NOTE — Telephone Encounter (Signed)
The past couple weeks has been having runny nose, extra phlem a little on the green side, not really sleeping, coughing, is sleeping during the day. His fever is up a little bit 98.3, 98.4, he is usually 96.7-97.4. O2 sats 89-91% on 2L which he states has been his baseline for the past year. Patient denies leaving the house or having any sick contacts.   DS please advise. Patient was advised if he has any increased breathing difficulty this weekend to go to the ED.

## 2019-02-05 MED ORDER — LEVOFLOXACIN 500 MG PO TABS: 500.0000 mg | ORAL_TABLET | Freq: Every day | ORAL | 0 refills | Status: AC

## 2019-02-05 NOTE — Telephone Encounter (Signed)
Still with moderately discolored sputum. Discussed options with him. He wishes to try a short course of abx. I have placed order for levofloxacin. Ask him to hold azithromycin while he is on the levoflox  Thanks   Theodoro Grist

## 2019-02-11 ENCOUNTER — Telehealth: Payer: Self-pay

## 2019-02-11 ENCOUNTER — Other Ambulatory Visit: Payer: Self-pay | Admitting: Pharmacist

## 2019-02-11 NOTE — Telephone Encounter (Signed)
Samples placed in the front office for pt pick up. 2 boxes of Eliquis 5 mg.   Daughter Hayze Hass going to pick up tomorrow.

## 2019-02-11 NOTE — Patient Outreach (Signed)
[name]) Care Management  02/11/2019  Dustin [name] 06/28/1956 [id number]  Communicated with [facility]. They are setting aside samples of Eliquis 5 mg for the patient to help bridge until we determine if he qualifies for patient assistance yet.   Catie Feliz Beam, PharmD, CPP PGY2 Ambulatory Care Pharmacy Resident, [name] Phone: [phone]

## 2019-02-11 NOTE — Patient Outreach (Signed)
[name]) Care Management  02/11/2019  Dustin [name] 04-06-1956 [id number]  Received message from Haynes Hoehn, PharmD (previously on patient's care team) that he had left her a message over the weekend noting that he was almost out of Eliquis.   Contacted patient, HIPAA verifiers identified. He noted that he cannot afford to pick up a supply of Eliquis, as it is >$200. He notes that he has been taking Eliquis once daily to stretch his supply, and he only has ~7 tablets left. [facility] does not have Eliquis samples, so I will contact Dr. Windell Hummingbird office on the patient's behalf to see if they have any samples of Eliquis 5 mg BID.  He did mail his out of pocket spend totals to [facility] pharmacy technician Lilla Shook, will alert her to be on the lookout for this in the mail. He needs to spend ~$540 out of pocket on prescription medications to be able to receive Eliquis through patient assistance, though we plan to submit the application to [facility] sooner than reaching that, so that we know the specific amount he will still need to spend.    Catie Feliz Beam, PharmD, CPP PGY2 Ambulatory Care Pharmacy Resident, [name] Phone: [phone]

## 2019-02-14 ENCOUNTER — Other Ambulatory Visit: Payer: Self-pay | Admitting: Pharmacy Technician

## 2019-02-14 ENCOUNTER — Telehealth: Payer: Self-pay | Admitting: Pulmonary Disease

## 2019-02-14 MED ORDER — LEVOFLOXACIN 500 MG PO TABS: 500.0000 mg | ORAL_TABLET | Freq: Every day | ORAL | 0 refills | Status: DC

## 2019-02-14 NOTE — Telephone Encounter (Signed)
Pt is aware of below recommendations and voiced his understanding.  Nothing further is needed.  

## 2019-02-14 NOTE — Telephone Encounter (Signed)
Spoke to pt, who stated that he completed course of Levaquin that was prescribed on 01/31/2019/. Pt feels that his sx improved, however they are still presents. Pt is requesting to extend course of Levaquin.  Pt reports of prod cough with clear mucus, sob with exertion and low grade temp of 99. Denied chills or sweats, diarrhea or headache.   DS please advise. Thanks

## 2019-02-14 NOTE — Telephone Encounter (Signed)
Refilled for 5 more days  Thanks  Theodoro Grist

## 2019-02-14 NOTE — Patient Outreach (Signed)
[name]) Care Management  02/14/2019  CHARVIS GHOLAR May 19, 1956 [id number]   Incoming message from [facility] CPhT Noreene Larsson [place] that she had a missed call from patient.  Outreach call to Mr. Veith, Mr. Hafler states that he wanted to see if I had received the paperwork he had mailed back. Mr. Ahrens states he mailed it out on Friday. Informed him that with Monday being a holiday it will delay the mail.  Informed him that I would look thru the paperwork once I have received it and submit it.  Suzan Slick Effie Shy CPhT Certified Pharmacy Technician [name] Care Management Direct Dial:[phone]

## 2019-02-17 ENCOUNTER — Telehealth: Payer: Self-pay | Admitting: Pharmacist

## 2019-02-17 ENCOUNTER — Other Ambulatory Visit: Payer: Self-pay | Admitting: Pharmacy Technician

## 2019-02-17 DIAGNOSIS — J449 Chronic obstructive pulmonary disease, unspecified: Secondary | ICD-10-CM | POA: Diagnosis not present

## 2019-02-17 DIAGNOSIS — J471 Bronchiectasis with (acute) exacerbation: Secondary | ICD-10-CM | POA: Diagnosis not present

## 2019-02-17 NOTE — Patient Outreach (Signed)
[name]) Care Management  02/17/2019  Dustin [name] Apr 13, 1956 [id number]   Incoming mail from patient that included numerous receipts from [facility] and [facility].  Received pharmacy printout from [facility] stating what patient has paid this year for medications.  Faxed document to [employer] so that Spiriva Respimat approval can be extended.  Will follow up with [employer] in 2-3 business days to check status of appeal.  Suzan Slick. Effie Shy CPhT Certified Pharmacy Technician [name] Care Management Direct Dial:[phone]

## 2019-02-17 NOTE — Telephone Encounter (Signed)
Received message from Lilla Shook, CPhT that she had received patient's total out of pocket spend from [facility] and [facility], and this total may be enough for the patient to qualify for Assistance for Eliquis.   Attempted to contact patient to ask him to come by clinic and sign the Eliquis assistance application, tried on both my work phone and a clinic phone. Was unable to leave a message on his phone. Called his emergency contact [place]field Citizen, sister), on [place], left HIPAA compliant message asking her to ask the patient to call me. This is time sensitive, as patient was only able to receive 2 weeks of samples from the cardiologist last week, and he previously noted that he was unable to afford a refill.   Have left the patient page at the front desk of clinic with instructions to have patient sign and date, and then fax to [facility] technician [place] at [phone].   Left provider page on Brock's desk for Dr. McLean-Scocuzza's signature tomorrow with instructions to fax to [facility] technician [place] at [phone].   Once Morrie Sheldon has received all parts of the application, she will submit to [employer].   Catie Feliz Beam, PharmD, CPP PGY2 Ambulatory Care Pharmacy Resident, [name] Phone: [phone]

## 2019-02-17 NOTE — Telephone Encounter (Signed)
Thank you :)

## 2019-02-18 ENCOUNTER — Other Ambulatory Visit: Payer: Self-pay | Admitting: Internal Medicine

## 2019-02-18 DIAGNOSIS — I824Y3 Acute embolism and thrombosis of unspecified deep veins of proximal lower extremity, bilateral: Secondary | ICD-10-CM

## 2019-02-18 DIAGNOSIS — I2699 Other pulmonary embolism without acute cor pulmonale: Secondary | ICD-10-CM

## 2019-02-18 MED ORDER — APIXABAN 5 MG PO TABS: 5.0000 mg | ORAL_TABLET | Freq: Two times a day (BID) | ORAL | 3 refills | Status: AC

## 2019-02-18 NOTE — Telephone Encounter (Signed)
Patient returned my call; he understands the below plan and will have someone go by the office to get the patient portion for him to sign. I explained to him that if we submit quickly, we'll know whether he will need to pay for another month of Eliquis.   He expressed understanding and will let me know when he has signed his portion.   Catie Feliz Beam, PharmD, CPP PGY2 Ambulatory Care Pharmacy Resident, [name] Phone: [phone]

## 2019-02-24 ENCOUNTER — Other Ambulatory Visit: Payer: Self-pay | Admitting: Pharmacy Technician

## 2019-02-24 NOTE — Telephone Encounter (Signed)
Contacted patient to follow up on completion of his portion of Eliquis application. He noted that he signed the application, and his daughter was supposed to bring back to clinic. Per front desk staff, this has not been dropped off yet.   Called patient's daughter, [name] [phone]) and left message for her to return my call at her convenience.   Catie Darnelle Maffucci, PharmD, [place] PGY2 Ambulatory Care Pharmacy Resident, [facility] [facility] Phone: [phone]

## 2019-02-24 NOTE — Patient Outreach (Signed)
[facility]) Care Management  02/24/2019  Dustin [name] May 12, 1956 [id number]   Received patient portion(s) of patient assistance application for Eliquis. Faxed completed application and required documents into [employer].  Will follow up with company in 3-5 business days to check status of application.  Maud Deed Chana Bode [place] Certified Pharmacy Technician [place] Management Direct Dial:[phone]

## 2019-02-25 ENCOUNTER — Other Ambulatory Visit: Payer: Self-pay | Admitting: Internal Medicine

## 2019-02-25 DIAGNOSIS — R600 Localized edema: Secondary | ICD-10-CM

## 2019-02-25 DIAGNOSIS — E876 Hypokalemia: Secondary | ICD-10-CM

## 2019-02-25 DIAGNOSIS — G2581 Restless legs syndrome: Secondary | ICD-10-CM

## 2019-02-25 DIAGNOSIS — F419 Anxiety disorder, unspecified: Secondary | ICD-10-CM

## 2019-02-25 MED ORDER — FUROSEMIDE 40 MG PO TABS: 40.0000 mg | ORAL_TABLET | Freq: Every day | ORAL | 3 refills | Status: DC

## 2019-02-25 MED ORDER — POTASSIUM CHLORIDE CRYS ER 10 MEQ PO TBCR: 10.0000 meq | EXTENDED_RELEASE_TABLET | Freq: Every day | ORAL | 3 refills | Status: DC

## 2019-02-25 MED ORDER — ROPINIROLE HCL 0.25 MG PO TABS: 0.2500 mg | ORAL_TABLET | Freq: Two times a day (BID) | ORAL | 3 refills | Status: AC

## 2019-02-25 MED ORDER — ESCITALOPRAM OXALATE 20 MG PO TABS: 20.0000 mg | ORAL_TABLET | Freq: Every day | ORAL | 3 refills | Status: AC

## 2019-02-26 ENCOUNTER — Other Ambulatory Visit: Payer: Self-pay | Admitting: Pharmacy Technician

## 2019-02-26 NOTE — Patient Outreach (Signed)
[facility]) Care Management  02/26/2019  Dustin [name] May 11, 1956 [id number]    Follow up call placed to [employer] regarding patient assistance application(s) for Spiriva Respimat , Manuela Schwartz states that patients application had been "archived" in their system, therefore application needed to be resent in to be reprocessed.   Faxed application, LIS letter and patient printout showing cost of SPiriva Respimat into [employer]. Will follow up in 7-10 business days to check status of application.   Follow up call placed to [place]  regarding patient assistance application(s) for Eliquis , [place] states that an application had been submitted back in March (Not by [facility])  and that patient has been informed that they needed an up to date proof of income. [place] states that she received application that was faxed in on 6/8 however the new proof of income will still be required...  Will route note to [place] to inform.  Maud Deed Chana Bode [place] Certified Pharmacy Technician [place] [facility] Direct Dial:[phone]

## 2019-02-27 ENCOUNTER — Other Ambulatory Visit: Payer: Self-pay | Admitting: Pharmacist

## 2019-02-27 NOTE — Patient Outreach (Signed)
[facility]) Care Management  02/27/2019  Dustin [name] 12-07-1955 [id number]  Received message from [name], CPhT that updated proof of income is required for Eliquis application.   Contacted patient; explained what was needed. He did not file taxes for 2019, but does have a copy of his 2020 social security award letter. He will drop it off by Dr. McLean-Scocuzza's office in the next few days, and when call me when he has done so.   Will pass along to [place] when I receive it to submit to [place] for Eliquis application.   Catie Darnelle Maffucci, PharmD, [place] PGY2 Ambulatory Care Pharmacy Resident, [place] [facility] Phone: [phone]

## 2019-02-28 ENCOUNTER — Other Ambulatory Visit: Payer: Self-pay | Admitting: Internal Medicine

## 2019-02-28 DIAGNOSIS — G2581 Restless legs syndrome: Secondary | ICD-10-CM

## 2019-03-03 ENCOUNTER — Other Ambulatory Visit: Payer: Self-pay | Admitting: Pulmonary Disease

## 2019-03-03 DIAGNOSIS — J449 Chronic obstructive pulmonary disease, unspecified: Secondary | ICD-10-CM

## 2019-03-03 DIAGNOSIS — F419 Anxiety disorder, unspecified: Secondary | ICD-10-CM

## 2019-03-03 MED ORDER — THEOPHYLLINE ER 400 MG PO TB24: 400.0000 mg | ORAL_TABLET | ORAL | 3 refills | Status: AC

## 2019-03-03 NOTE — Progress Notes (Signed)
Theophylline re-ordered

## 2019-03-04 ENCOUNTER — Other Ambulatory Visit: Payer: Self-pay | Admitting: Internal Medicine

## 2019-03-04 DIAGNOSIS — G2581 Restless legs syndrome: Secondary | ICD-10-CM

## 2019-03-05 ENCOUNTER — Other Ambulatory Visit: Payer: Self-pay | Admitting: Pharmacy Technician

## 2019-03-05 NOTE — Patient Outreach (Signed)
[facility] [facility]) Care Management  03/05/2019  Dustin [name] [birth year] [id number]    Follow up call placed to [place] regarding patient assistance application(s) for Eliquis , [place] confirms patient has been approved as of 6/15 until 09/18/19. Medication is set to be delivered this week.  Follow up:  Will route note to [place] to inform.  Maud Deed Chana Bode [place] Certified Pharmacy Technician [place] Management Direct Dial:[phone]

## 2019-03-06 ENCOUNTER — Other Ambulatory Visit: Payer: Self-pay | Admitting: Pharmacist

## 2019-03-06 NOTE — Patient Outreach (Signed)
[facility]) Care Management  03/06/2019  Dustin [name] [birth year] [id number]   Received message from [name], CPhT that patient was APPROVED for Eliquis assistance through 09/18/2019.   Attempted to contact patient; was unable to leave a message.  Contacted his granddaughter, [name] at [phone]. Left HIPAA compliant message.   Will plan to call back within 7-10 business days to ensure medication is approved.   Catie Darnelle Maffucci, PharmD, [place] PGY2 Ambulatory Care Pharmacy Resident, [place] [facility] Phone: [phone]

## 2019-03-07 ENCOUNTER — Other Ambulatory Visit: Payer: Self-pay | Admitting: Pharmacist

## 2019-03-07 NOTE — Patient Outreach (Signed)
[facility]) Care Management  03/07/2019  Dustin BARBERI 09-15-1956 [id number]   Contacted patient to update on being approved for Eliquis through 09/18/2019. He answered, noted that he had actually received the Eliquis in the mail yesterday. Expressed his appreciation for our efforts.   I informed him that [name] is still working on [employer], and we will let him know when we have a determination. He expressed understanding.   Catie Darnelle Maffucci, PharmD, [place] PGY2 Ambulatory Care Pharmacy Resident, [place] [facility] Phone: [phone]

## 2019-03-12 ENCOUNTER — Ambulatory Visit (INDEPENDENT_AMBULATORY_CARE_PROVIDER_SITE_OTHER): Payer: Medicare Other | Admitting: Internal Medicine

## 2019-03-12 ENCOUNTER — Other Ambulatory Visit: Payer: Self-pay

## 2019-03-12 DIAGNOSIS — J3489 Other specified disorders of nose and nasal sinuses: Secondary | ICD-10-CM

## 2019-03-12 DIAGNOSIS — J9611 Chronic respiratory failure with hypoxia: Secondary | ICD-10-CM | POA: Diagnosis not present

## 2019-03-12 DIAGNOSIS — I2699 Other pulmonary embolism without acute cor pulmonale: Secondary | ICD-10-CM

## 2019-03-12 DIAGNOSIS — Z1329 Encounter for screening for other suspected endocrine disorder: Secondary | ICD-10-CM

## 2019-03-12 DIAGNOSIS — G4700 Insomnia, unspecified: Secondary | ICD-10-CM | POA: Diagnosis not present

## 2019-03-12 DIAGNOSIS — R319 Hematuria, unspecified: Secondary | ICD-10-CM

## 2019-03-12 DIAGNOSIS — J432 Centrilobular emphysema: Secondary | ICD-10-CM | POA: Diagnosis not present

## 2019-03-12 DIAGNOSIS — Z0000 Encounter for general adult medical examination without abnormal findings: Secondary | ICD-10-CM

## 2019-03-12 DIAGNOSIS — J449 Chronic obstructive pulmonary disease, unspecified: Secondary | ICD-10-CM

## 2019-03-12 DIAGNOSIS — Z1389 Encounter for screening for other disorder: Secondary | ICD-10-CM

## 2019-03-12 DIAGNOSIS — J439 Emphysema, unspecified: Secondary | ICD-10-CM

## 2019-03-12 DIAGNOSIS — I5032 Chronic diastolic (congestive) heart failure: Secondary | ICD-10-CM

## 2019-03-12 DIAGNOSIS — Z125 Encounter for screening for malignant neoplasm of prostate: Secondary | ICD-10-CM

## 2019-03-12 DIAGNOSIS — R7989 Other specified abnormal findings of blood chemistry: Secondary | ICD-10-CM

## 2019-03-12 DIAGNOSIS — R600 Localized edema: Secondary | ICD-10-CM

## 2019-03-12 DIAGNOSIS — J309 Allergic rhinitis, unspecified: Secondary | ICD-10-CM

## 2019-03-12 DIAGNOSIS — Z13220 Encounter for screening for lipoid disorders: Secondary | ICD-10-CM

## 2019-03-12 IMAGING — CR DG CHEST 2V
1 series · 3 of 3 positions shown · non-contrast
Comparison: Two-view chest x-ray 08/06/2017.

CLINICAL DATA: Shortness of breath.  COPD.

EXAM:
CHEST  2 VIEW

[Series 1: dg chest 2 view · 0.14mm/px · 3 of 3 slices shown]
[im 1/3]
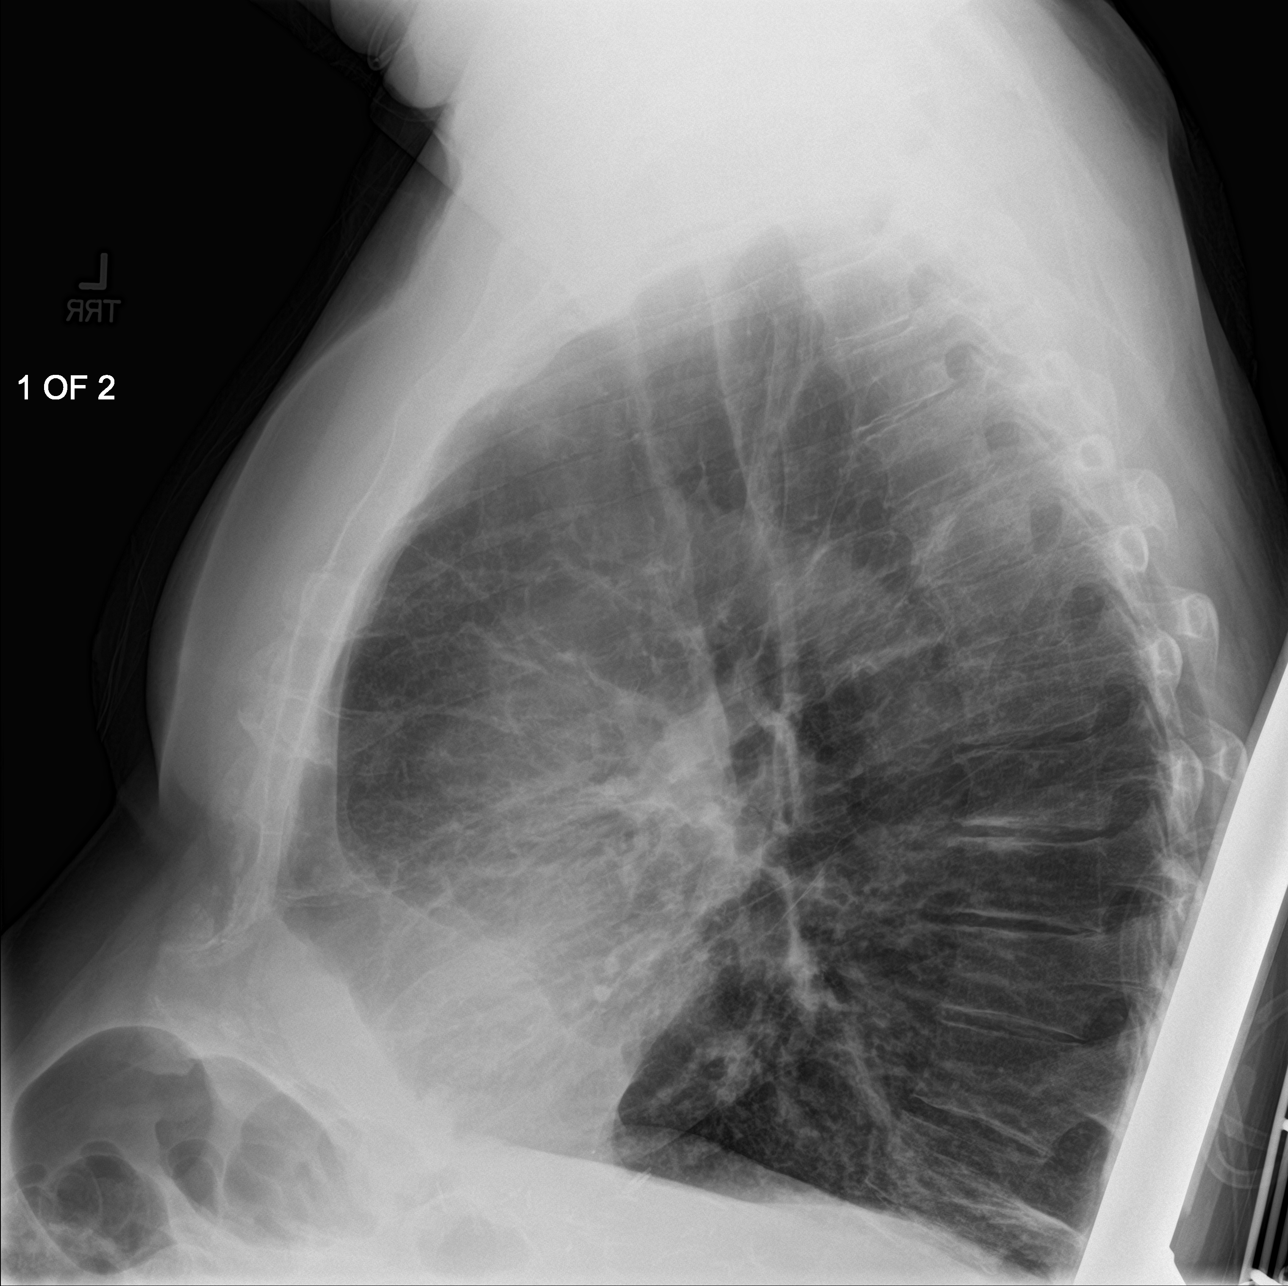
[im 2/3]
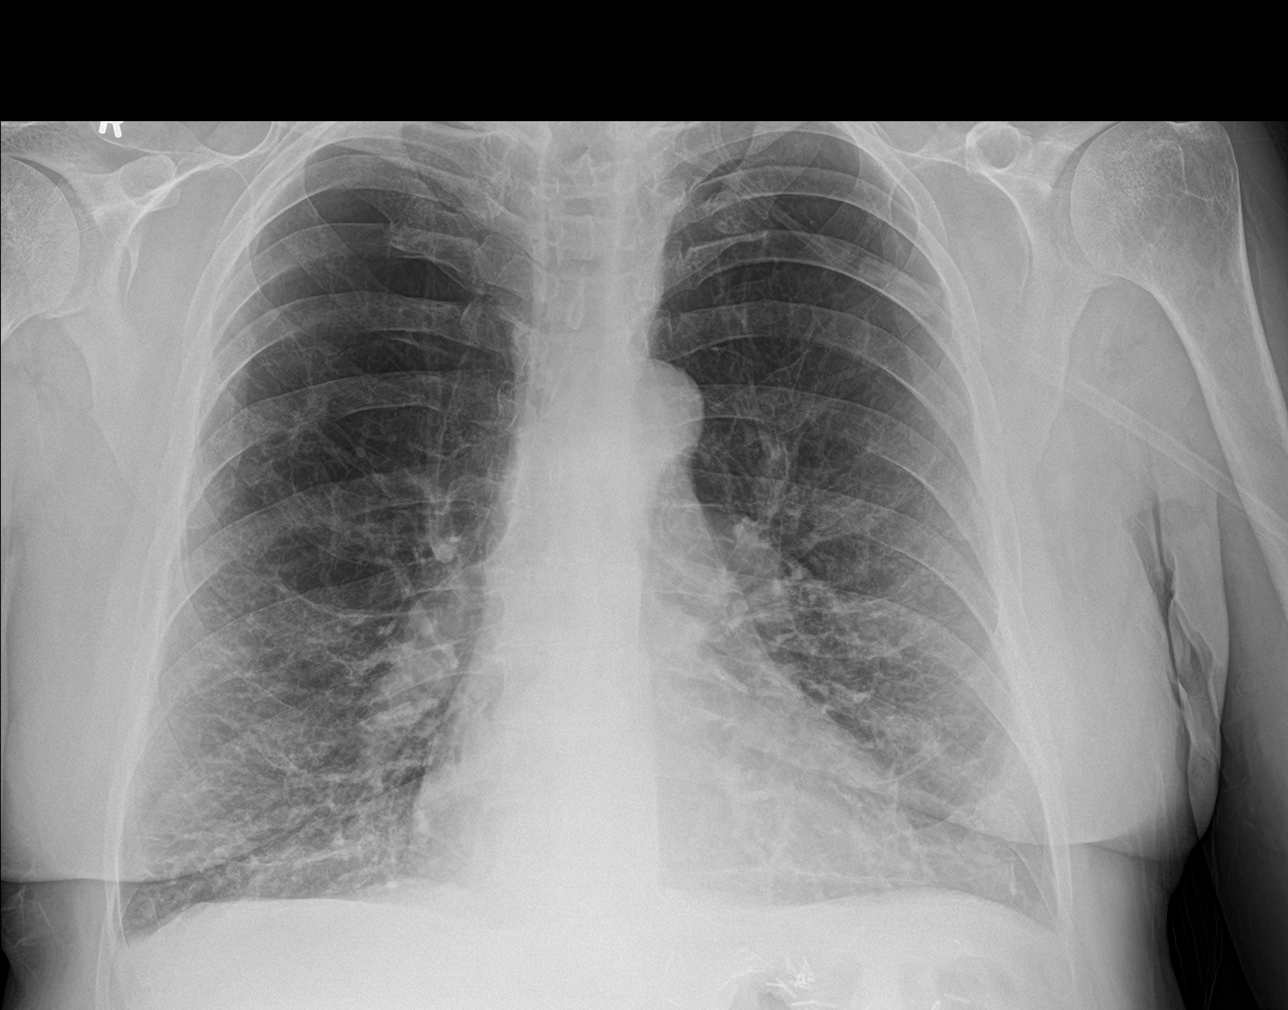
[im 3/3]
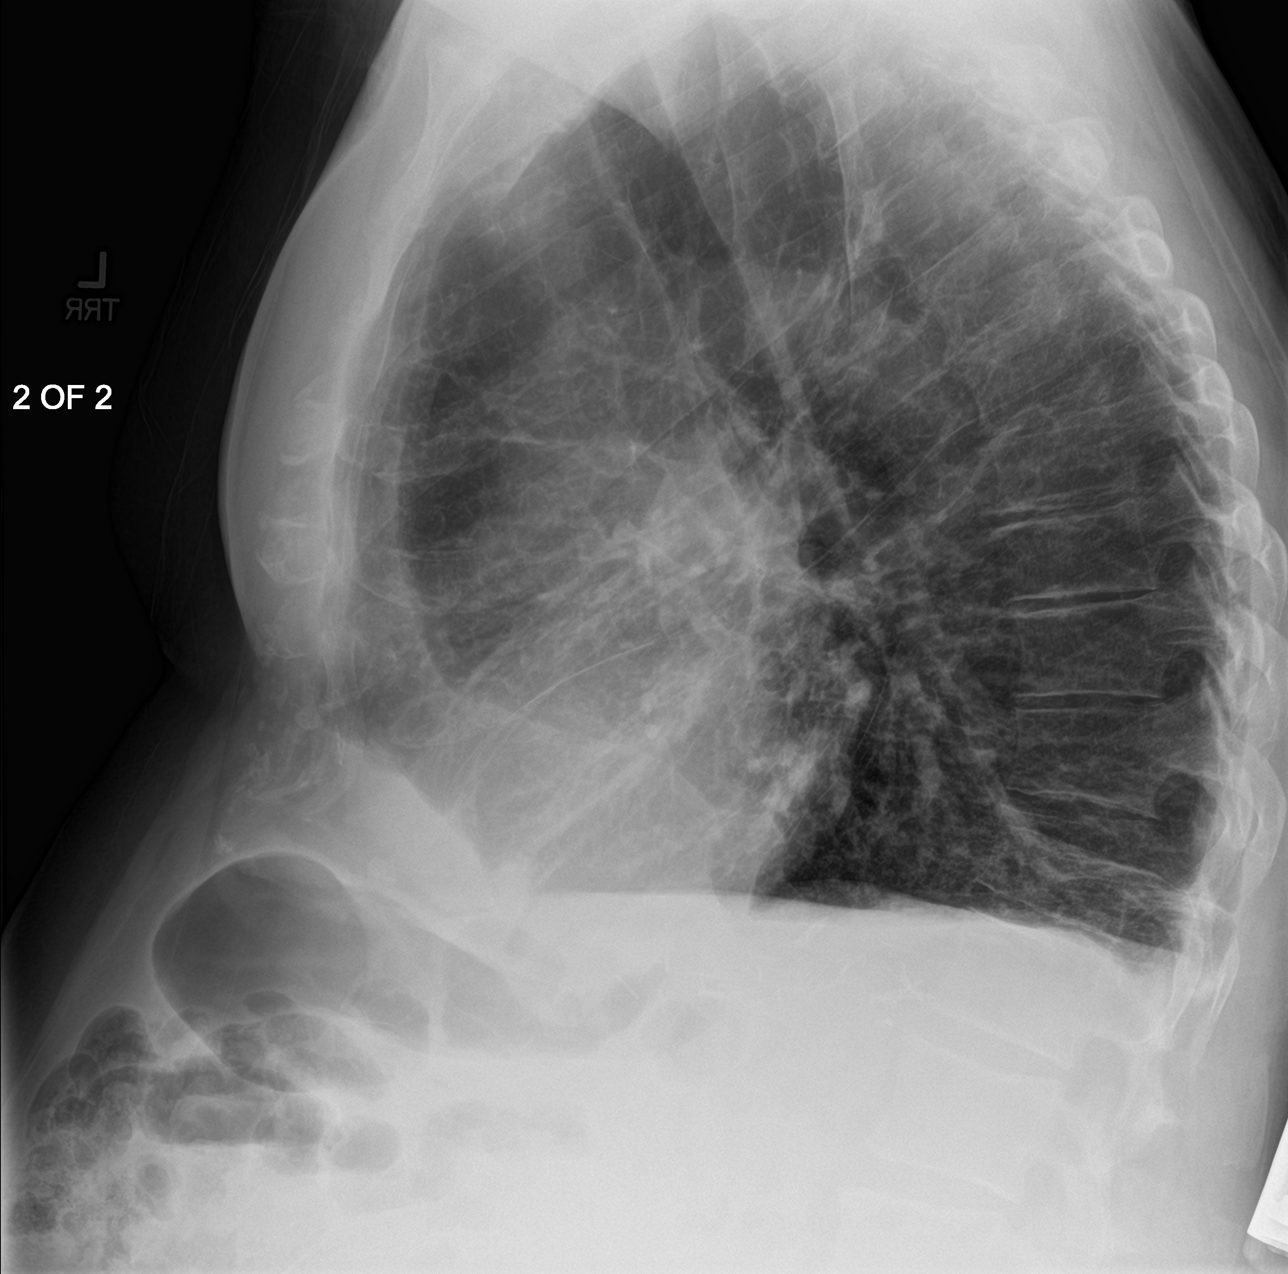

[3 of 3 positions shown; findings below may reference images not displayed]

FINDINGS: Changes of COPD are again noted. The heart size is normal. Minimal
lower lobe airspace disease or scarring is stable. Superimposed
right middle lobe airspace disease is new.
IMPRESSION: 1. New right middle lobe airspace disease is concerning for
pneumonia.
2. COPD.
3. Stable lower lobe airspace disease, likely atelectasis or
scarring.

## 2019-03-12 MED ORDER — SALINE SPRAY 0.65 % NA SOLN: 1.0000 | NASAL | 12 refills | Status: AC | PRN

## 2019-03-12 NOTE — Progress Notes (Signed)
Telephone Note  I connected with Dustin [name]  on 03/12/19 at 11:000 AM EDT by telephone and verified that I am speaking with the correct person using two identifiers.  Location patient: home Location provider:work  Persons participating in the virtual visit: patient, provider, pts daughter  I discussed the limitations of evaluation and management by telemedicine and the availability of in person appointments. The patient expressed understanding and agreed to proceed.   HPI: 1. COPD stable with chronic respiratory failure with hypoxia on O2 2L currently. He reports dry nasal passages at night using flonase and not helping at night with sx's of nasal congestion and dry nose. His O2 is not humidified but will disc with pulmonary  Reviewed with Pulmonary Dr. [name] who rec saline nasal drips with glycerine as an option and will try to coordinate humidifications with O2  2. Insomnia improved with trazadone 50-100 mg qhs he is sleeping 7 hrs at night  3. C/o leg edema b/l he is drinking 96 oz of liquid daily an dhas missed 3 days if lasix 40 mg his weight is 224 lbs and up with h/o chronic diastolic CHF    ROS: See pertinent positives and negatives per HPI.  Past Medical History:  Diagnosis Date  . Allergy   . Anxiety   . Asthma   . Chronic diastolic CHF (congestive heart failure) ([place])    a. echo 07/2013: EF 60-65%, DD, biatrial dilatation, Ao sclerosis, dilated RV, moderate pulmonary HTN, elevated CV and RA pressures; b. patient reported echo at Dr. Laurelyn Sickle office 02/2015 - his office does not have record of him being a pt there c. echo 11/2015: EF 60-65%, Grade 1 DD, mod-severe pulm pressures  . Chronic respiratory failure (HCC)    a. on 2L via nasal cannula; b. secondary to COPD  . COPD (chronic obstructive pulmonary disease) (HCC)    on 2L continuous   . Depression   . Emphysema of lung ([place])   . GERD (gastroesophageal reflux disease)   . HCAP (healthcare-associated pneumonia)    01/22/18-01/28/18   . Headache   . Hypertension   . Leg DVT (deep venous thromboembolism), acute, bilateral ([place])    02/26/18  . Personal history of tobacco use, presenting hazards to health 08/17/2015  . Pulmonary embolism ([place])    02/26/18  . Pulmonary HTN ([place])   . Tobacco abuse     Past Surgical History:  Procedure Laterality Date  . ABDOMINAL SURGERY    . ADENOIDECTOMY    . CARPAL TUNNEL RELEASE Right   . corpal tunnel Right   . ELBOW SURGERY Right    repaired tendon  . hemmorhoid N/A   . HEMORRHOID SURGERY    . NASAL SINUS SURGERY    . NASAL SINUS SURGERY     1970s  . NOSE SURGERY    . reflux surgery     1994  . ROTATOR CUFF REPAIR Right   . SHOULDER SURGERY    . TENNIS ELBOW RELEASE/NIRSCHEL PROCEDURE Right   . TONSILLECTOMY    . URETHRA SURGERY     surgery 6 times from age [age] yrs old  . URETHRA SURGERY      Family History  Problem Relation Age of Onset  . CAD Father   . Hyperlipidemia Father   . Stroke Father   . Heart disease Father   . Arthritis Father   . Hearing loss Father   . Hypertension Father   . Hypertension Mother   . Peripheral Artery Disease  Mother   . Rheum arthritis Mother   . Asthma Mother   . Bipolar disorder Mother   . Depression Mother   . Malignant hypertension Mother   . Arthritis Mother   . Hearing loss Mother   . Heart disease Mother   . Hyperlipidemia Mother   . Kidney disease Mother   . Birth defects Brother   . Heart disease Brother   . Alcohol abuse Daughter   . Arthritis Daughter   . Asthma Daughter   . Depression Daughter   . Drug abuse Daughter   . Miscarriages / [place] Daughter   . Intellectual disability Daughter   . Kidney disease Son   . Birth defects Paternal Grandmother   . Depression Paternal Grandmother   . Heart disease Paternal Grandmother   . Birth defects Sister   . Diabetes Neg Hx     SOCIAL HX: lives alone though daughter comes to visit at times    Current Outpatient Medications:  .   ALPRAZolam (XANAX) 1 MG tablet, Take 1 tablet (1 mg total) by mouth 3 (three) times daily as needed for anxiety., Disp: 90 tablet, Rfl: 3 .  AMBULATORY NON FORMULARY MEDICATION, 1 each by Does not apply route as needed. Please provide patient with "So Clean" CPAP cleaning device to be used as needed., Disp: 1 Device, Rfl: 0 .  antiseptic oral rinse (BIOTENE) LIQD, 15 mLs by Mouth Rinse route daily as needed for dry mouth., Disp: 473 mL, Rfl: 12 .  apixaban (ELIQUIS) 5 MG TABS tablet, Take 1 tablet (5 mg total) by mouth 2 (two) times daily., Disp: 180 tablet, Rfl: 3 .  carbamide peroxide (DEBROX) 6.5 % OTIC solution, Place 5 drops into both ears 2 (two) times daily. Prn x 4-7 days, Disp: 15 mL, Rfl: 11 .  Cholecalciferol (VITAMIN D) 2000 units tablet, Take 2,000 Units by mouth daily., Disp: , Rfl:  .  clobetasol (TEMOVATE) 0.05 % external solution, Apply 1 application topically 2 (two) times daily. Prn to scalp and ears, Disp: 50 mL, Rfl: 11 .  diltiazem (CARDIZEM) 30 MG tablet, Take 1 tablet (30 mg total) by mouth 3 (three) times daily as needed., Disp: 180 tablet, Rfl: 3 .  escitalopram (LEXAPRO) 20 MG tablet, Take 1 tablet (20 mg total) by mouth at bedtime., Disp: 90 tablet, Rfl: 3 .  fluticasone (FLONASE) 50 MCG/ACT nasal spray, Place 2 sprays into both nostrils daily as needed for allergies or rhinitis., Disp: 16 g, Rfl: 11 .  furosemide (LASIX) 40 MG tablet, Take 1 tablet (40 mg total) by mouth daily. In am, Disp: 90 tablet, Rfl: 3 .  gabapentin (NEURONTIN) 300 MG capsule, TAKE 1 TO 2 CAPSULES BY MOUTH THREE TIMES DAILY, Disp: 520 capsule, Rfl: 11 .  hydrocortisone 2.5 % cream, Apply topically 2 (two) times daily as needed. face, Disp: 30 g, Rfl: 11 .  ipratropium-albuterol (DUONEB) 0.5-2.5 (3) MG/3ML SOLN, Take 3 mLs by nebulization every 6 (six) hours as needed., Disp: 1080 mL, Rfl: 3 .  ketoconazole (NIZORAL) 2 % shampoo, Apply 1 application topically 2 (two) times a week. Let stand for 5  minutes, Disp: 120 mL, Rfl: 3 .  magnesium oxide (MAG-OX) 400 MG tablet, Take 400 mg by mouth daily., Disp: , Rfl:  .  mometasone-formoterol (DULERA) 200-5 MCG/ACT AERO, Inhale 2 puffs into the lungs 2 (two) times daily., Disp: 1 Inhaler, Rfl: 4 .  mupirocin ointment (BACTROBAN) 2 %, Apply 1 application topically 2 (two) times daily. Use left  upper arm  And left ear x 1-2 weeks, Disp: 30 g, Rfl: 2 .  nystatin (MYCOSTATIN) 100000 UNIT/ML suspension, Use as directed 5 mLs (500,000 Units total) in the mouth or throat 4 (four) times daily. Swish and spit 3 or 4 times daily for at least 5 days.  Then may be used as needed thereafter for thrush, Disp: 473 mL, Rfl: 0 .  potassium chloride (K-DUR) 10 MEQ tablet, Take 1-2 tablets (10-20 mEq total) by mouth daily. Repeat if second LASIX taken, Disp: 180 tablet, Rfl: 3 .  Probiotic Product (PROBIOTIC PO), Take by mouth., Disp: , Rfl:  .  rOPINIRole (REQUIP) 0.25 MG tablet, Take 1 tablet (0.25 mg total) by mouth 2 (two) times daily., Disp: 180 tablet, Rfl: 3 .  sodium chloride (OCEAN) 0.65 % SOLN nasal spray, Place 1-2 sprays into both nostrils as needed for congestion., Disp: 480 mL, Rfl: 12 .  Spacer/Aero-Holding Chambers (OPTICHAMBER ADVANTAGE-LG MASK) MISC, Use as directed with inhaler diag  j44.1, Disp: 2 each, Rfl: 1 .  theophylline (UNIPHYL) 400 MG 24 hr tablet, Take 1 tablet (400 mg total) by mouth every morning., Disp: 90 tablet, Rfl: 3 .  Tiotropium Bromide Monohydrate (SPIRIVA RESPIMAT) 2.5 MCG/ACT AERS, Inhale 2 puffs into the lungs daily., Disp: 3 Inhaler, Rfl: 3 .  traZODone (DESYREL) 50 MG tablet, Take 1-2 tablets (50-100 mg total) by mouth at bedtime as needed for sleep., Disp: 60 tablet, Rfl: 11  EXAM:  VITALS per patient if applicable:  GENERAL: alert, oriented, appears well and in no acute distress  HEENT: atraumatic, conjunttiva clear, no obvious abnormalities on inspection of external nose and ears  NECK: normal movements of the head  and neck  LUNGS: on inspection no signs of respiratory distress, breathing rate appears normal, no obvious gross SOB, gasping or wheezing  CV: no obvious cyanosis  MS: moves all visible extremities without noticeable abnormality  PSYCH/NEURO: pleasant and cooperative, no obvious depression or anxiety, speech and thought processing grossly intact  ASSESSMENT AND PLAN:  Discussed the following assessment and plan:  Centrilobular emphysema (HCC)/COPD/Chronic respiratory failure with hypoxia (HCC) -  Cont meds Dulera, spiriva, prn duoneb pulm will try to get O2 humidified  rec use Nasal saline with glycerine and Flonase max sprays 2 total per day   pulmonary embolism /DVT  History continue chronic anticoagulation with eliquis 5 mg bid   Insomnia, unspecified type - Plan: cont trazadone it is helping  Allergic rhinitis, unspecified seasonality, unspecified trigger - Plan: sodium chloride (OCEAN) 0.65 % SOLN nasal spray, prn Flonase rec 1 spray bid max 2 sprays total   Dry nose - Plan: sodium chloride (OCEAN) 0.65 % SOLN nasal spray with glycerin try to get O2 humidified   Chronic diastolic heart failure (HCC) - Plan:  Leg edema - Plan:  -rec take Lasix 40 mg qd and reduce 96 oz fluid daily to 60 oz daily  Rx compression stockings zip up knee highs  HM Flu shot utd Tdap, prevnar check prior PCP records Dr. Clayborn Bigness pna 23 had 01/16/18  Consider shingrix in future Immune MMR  Consider check hep B, testosterone at f/u labs  Hep C neg cologaurdneg 08/29/18 PSA normal 05/22/18  Smoker age 64 to 70 max 2 ppd quit 4/2019now smoking 2 cig per day noFHlung cancer   I discussed the assessment and treatment plan with the patient. The patient was provided an opportunity to ask questions and all were answered. The patient agreed with the plan and demonstrated  an understanding of the instructions.   The patient was advised to call back or seek an in-person evaluation if the symptoms  worsen or if the condition fails to improve as anticipated.  Time spent 20 minutes  Delorise Jackson, MD

## 2019-03-12 NOTE — Patient Instructions (Signed)
Heart Failure  Heart failure means your heart has trouble pumping blood. This makes it hard for your body to work well. Heart failure is usually a long-term (chronic) condition. You must take good care of yourself and follow your treatment plan from your doctor. Follow these instructions at home: Medicines  Take over-the-counter and prescription medicines only as told by your doctor. ? Do not stop taking your medicine unless your doctor told you to do that. ? Do not skip any doses. ? Refill your prescriptions before you run out of medicine. You need your medicines every day. Eating and drinking   Eat heart-healthy foods. Talk with a diet and nutrition specialist (dietitian) to make an eating plan.  Choose foods that: ? Have no trans fat. ? Are low in saturated fat and cholesterol.  Choose healthy foods, like: ? Fresh or frozen fruits and vegetables. ? Fish. ? Low-fat (lean) meats. ? Legumes (like beans, peas, and lentils). ? Fat-free or low-fat dairy products. ? Whole-grain foods. ? High-fiber foods.  Limit salt (sodium) if told by your doctor. Ask your nutrition specialist to recommend heart-healthy seasonings.  Cook in healthy ways instead of frying. Healthy ways of cooking include: ? Roasting. ? Grilling. ? Broiling. ? Baking. ? Poaching. ? Steaming. ? Stir-frying.  Limit how much fluid you drink, if told by your doctor. Lifestyle  Do not smoke or use chewing tobacco. Do not use nicotine gum or patches before talking to your doctor.  Limit alcohol intake to no more than 1 drink a day for non-pregnant women and 2 drinks a day for men. One drink equals 12 oz of beer, 5 oz of wine, or 1 oz of hard liquor. ? Tell your doctor if you drink alcohol many times a week. ? Talk with your doctor about whether any alcohol is safe for you. ? You should stop drinking alcohol: ? If your heart has been damaged by alcohol. ? You have very bad heart failure.  Do not use illegal  drugs.  Lose weight if told by your doctor.  Do moderate physical activity if told by your doctor. Ask your doctor what activities are safe for you if: ? You are of older age (elderly). ? You have very bad heart failure. Keep track of important information  Weigh yourself every day. ? Weigh yourself every morning after you pee (urinate) and before breakfast. ? Wear the same amount of clothing each time. ? Write down your daily weight. Give your record to your doctor.  Check and write down your blood pressure as told by your doctor.  Check your pulse as told by your doctor. Dealing with heat and cold  If the weather is very hot: ? Avoid activity that takes a lot of energy. ? Use air conditioning or fans, or find a cooler place. ? Avoid caffeine. ? Avoid alcohol. ? Wear clothing that is loose-fitting, lightweight, and light-colored.  If the weather is very cold: ? Avoid activity that takes a lot of energy. ? Layer your clothes. ? Wear mittens or gloves, a hat, and a scarf when you go outside. ? Avoid alcohol. General instructions  Manage other conditions that you have as told by your doctor.  Learn to manage stress. If you need help, ask your doctor.  Plan rest periods for when you get tired.  Get education and support as needed.  Get rehab (rehabilitation) to help you stay independent and to help with everyday tasks.  Stay up to date with shots (  immunizations), especially pneumococcal and flu (influenza) shots.  Keep all follow-up visits as told by your doctor. This is important. Contact a doctor if:  You gain weight quickly.  You are more short of breath than normal.  You cannot do your normal activities.  You tire easily.  You cough more than normal, especially with activity.  You have any or more puffiness (swelling) in areas such as your hands, feet, ankles, or belly (abdomen).  You cannot sleep because it is hard to breathe.  You feel like your heart  is beating fast (palpitations).  You get dizzy or light-headed when you stand up. Get help right away if:  You have trouble breathing.  You or someone else notices a change in your awareness. This could be trouble staying awake or trouble concentrating.  You have chest pain or discomfort.  You pass out (faint). Summary  Heart failure means your heart has trouble pumping blood.  Make sure you refill your prescriptions before you run out of medicine. You need your medicines every day.  Keep records of your weight and blood pressure to give to your doctor.  Contact a doctor if you gain weight quickly. This information is not intended to replace advice given to you by your health care provider. Make sure you discuss any questions you have with your health care provider. Document Released: 06/13/2008 Document Revised: 05/29/2018 Document Reviewed: 09/26/2016 Elsevier Interactive Patient Education  2019 [employer]vier Inc.  Edema  Edema is an abnormal buildup of fluids in the body tissues and under the skin. Swelling of the legs, feet, and ankles is a common symptom that becomes more likely as you get older. Swelling is also common in looser tissues, like around the eyes. When the affected area is squeezed, the fluid may move out of that spot and leave a dent for a few moments. This dent is called pitting edema. There are many possible causes of edema. Eating too much salt (sodium) and being on your feet or sitting for a long time can cause edema in your legs, feet, and ankles. Hot weather may make edema worse. Common causes of edema include:  Heart failure.  Liver or kidney disease.  Weak leg blood vessels.  Cancer.  An injury.  Pregnancy.  Medicines.  Being obese.  Low protein levels in the blood. Edema is usually painless. Your skin may look swollen or shiny. Follow these instructions at home:  Keep the affected body part raised (elevated) above the level of your heart when  you are sitting or lying down.  Do not sit still or stand for long periods of time.  Do not wear tight clothing. Do not wear garters on your upper legs.  Exercise your legs to get your circulation going. This helps to move the fluid back into your blood vessels, and it may help the swelling go down.  Wear elastic bandages or support stockings to reduce swelling as told by your health care provider.  Eat a low-salt (low-sodium) diet to reduce fluid as told by your health care provider.  Depending on the cause of your swelling, you may need to limit how much fluid you drink (fluid restriction).  Take over-the-counter and prescription medicines only as told by your health care provider. Contact a health care provider if:  Your edema does not get better with treatment.  You have heart, liver, or kidney disease and have symptoms of edema.  You have sudden and unexplained weight gain. Get help right away if:  You develop shortness of breath or chest pain.  You cannot breathe when you lie down.  You develop pain, redness, or warmth in the swollen areas.  You have heart, liver, or kidney disease and suddenly get edema.  You have a fever and your symptoms suddenly get worse. Summary  Edema is an abnormal buildup of fluids in the body tissues and under the skin.  Eating too much salt (sodium) and being on your feet or sitting for a long time can cause edema in your legs, feet, and ankles.  Keep the affected body part raised (elevated) above the level of your heart when you are sitting or lying down. This information is not intended to replace advice given to you by your health care provider. Make sure you discuss any questions you have with your health care provider. Document Released: 09/04/2005 Document Revised: 10/07/2016 Document Reviewed: 10/07/2016 Elsevier Interactive Patient Education  2019 [employer]

## 2019-03-17 ENCOUNTER — Other Ambulatory Visit: Payer: Self-pay | Admitting: Pharmacy Technician

## 2019-03-17 NOTE — Patient Outreach (Signed)
[facility]) Care Management  03/17/2019  Dustin [name] 24-Jul-1956 [id number]    Follow up call placed to [employer] regarding patient assistance application(s) for Spiriva Respimat , April confirms patient has been approved as of 6/15 until 09/18/19. Medication to arrive at patients home in the next 5 business days.  Follow up:  Will route note to [place] to inform.  Maud Deed Chana Bode [place] Certified Pharmacy Technician [place] Management Direct [phone]

## 2019-03-19 DIAGNOSIS — J449 Chronic obstructive pulmonary disease, unspecified: Secondary | ICD-10-CM | POA: Diagnosis not present

## 2019-03-19 DIAGNOSIS — J471 Bronchiectasis with (acute) exacerbation: Secondary | ICD-10-CM | POA: Diagnosis not present

## 2019-03-24 ENCOUNTER — Ambulatory Visit: Payer: Self-pay | Admitting: Pharmacist

## 2019-03-24 DIAGNOSIS — J432 Centrilobular emphysema: Secondary | ICD-10-CM

## 2019-03-24 DIAGNOSIS — I5032 Chronic diastolic (congestive) heart failure: Secondary | ICD-10-CM

## 2019-03-24 NOTE — Progress Notes (Signed)
  Care Management Note   Dustin [name] is a 63 y.o. year old male who is a primary care patient of McLean-Scocuzza, Nino Glow, MD. The CM team was consulted for assistance with chronic disease management and care coordination. We have been working collaboratively on medication access with this patient.   I reached out to [employer] by phone today. Informed him that he had been approved for Spiriva patient assistance through [employer], and should receive medication in the next 1-2 weeks. He verbalized understanding and appreciation.   We also discussed chronic care management.   Dustin [name] was given information about Chronic Care Management services today including:  1. CCM service includes personalized support from designated clinical staff supervised by his physician, including individualized plan of care and coordination with other care providers 2. 24/7 contact phone numbers for assistance for urgent and routine care needs. 3. Service will only be billed when office clinical staff spend 20 minutes or more in a month to coordinate care. 4. Only one practitioner may furnish and bill the service in a calendar month. 5. The patient may stop CCM services at any time (effective at the end of the month) by phone call to the office staff. 6. The patient will be responsible for cost sharing (co-pay) of up to 20% of the service fee (after annual deductible is met).  Patient agreed to services and verbal consent obtained.    Review of patient status, including review of consultants reports, relevant laboratory and other test results, and collaboration with appropriate care team members and the patient's provider was performed as part of comprehensive patient evaluation and provision of chronic care management services.   Follow Up Plan:  - Will follow up with patient in 3-5 weeks for continued medication management support.   Catie Darnelle Maffucci, PharmD Clinical Pharmacist [facility] [phone]

## 2019-03-28 ENCOUNTER — Telehealth: Payer: Self-pay | Admitting: Cardiovascular Disease

## 2019-03-28 NOTE — Telephone Encounter (Signed)
Pt c/o swelling: STAT is pt has developed SOB within 24 hours  1) How much weight have you gained and in what time span?  Yes in 2 months 216 to 235  2) If swelling, where is the swelling located? Pitting edema in  Bilateral ankles and feet and abdomen   3) Are you currently taking a fluid pill? yes  Are you currently SOB?  A little  4) Do you have a log of your daily weights (if so, list)? In 2 months went from 216-235 , no daily log  Have you gained 3 pounds in a day or 5 pounds in a week?  At"sometime, yes"  5) Have you traveled recently? No

## 2019-03-28 NOTE — Telephone Encounter (Signed)
Returned the pt call. Unable to lmom pt voicemail is not set up.

## 2019-03-28 NOTE — Telephone Encounter (Signed)
Patient returned a call from our office.

## 2019-03-31 NOTE — Telephone Encounter (Signed)
Returned the pt call. Unable to lmom. Pt voicemail is not set up. 

## 2019-03-31 NOTE — Telephone Encounter (Signed)
Patient sister returning call  States the best number to call is [phone]

## 2019-03-31 NOTE — Telephone Encounter (Signed)
Spoke with the pt sister [name] who is his healthcare POA. [name] who is also nurse sts that she is visiting from [facility] and has recently sen the pt. [name] who is a Marine scientist sts that the pt weight is up. He has LE pitting edema and his abdomen is distended. His HR yesterday at rest was >100bpm. This morning 96-97bpm.  Pt was last seen in Aug 2019. Tyrone Sage that the pt to be scheduled for an appt. Appt scheduled with Dr.Gollan on 04/01/19 @ 2pm. Pt will need someone to attend the appt. COVID screening done. [name] aware of the appt date and time and location. Adv her that they will need to enter through the medical mall entrance for screening and wear a face mask. Tyrone Sage to arrive 2min prior to the appt.to allot enough time  get down to the appt on time. [name] verbalized understanding and voiced appreciation for the call.      COVID-19 Pre-Screening Questions:  . In the past 7 to 10 days have you had a cough,  shortness of breath, headache, congestion, fever (100 or greater) body aches, chills, sore throat, or sudden loss of taste or sense of smell? No . Have you been around anyone with known Covid 19. No . Have you been around anyone who is awaiting Covid 19 test results in the past 7 to 10 days? No . Have you been around anyone who has been exposed to Covid 19, or has mentioned symptoms of Covid 19 within the past 7 to 10 days? No

## 2019-03-31 NOTE — Telephone Encounter (Signed)
Correct number to call is [phone]

## 2019-04-01 ENCOUNTER — Ambulatory Visit
Admission: RE | Admit: 2019-04-01 | Discharge: 2019-04-01 | Disposition: A | Discharge status: Home / Self Care | Payer: Medicare Other | Source: Ambulatory Visit | Attending: Cardiovascular Disease | Admitting: Cardiovascular Disease

## 2019-04-01 ENCOUNTER — Telehealth: Payer: Self-pay | Admitting: Cardiovascular Disease

## 2019-04-01 ENCOUNTER — Ambulatory Visit: Payer: Medicare Other | Admitting: Cardiovascular Disease

## 2019-04-01 VITALS — BP 120/76 | HR 108 | Ht 71.0 in | Wt 230.4 lb

## 2019-04-01 DIAGNOSIS — J441 Chronic obstructive pulmonary disease with (acute) exacerbation: Secondary | ICD-10-CM

## 2019-04-01 DIAGNOSIS — I5033 Acute on chronic diastolic (congestive) heart failure: Secondary | ICD-10-CM | POA: Diagnosis not present

## 2019-04-01 DIAGNOSIS — F17200 Nicotine dependence, unspecified, uncomplicated: Secondary | ICD-10-CM | POA: Diagnosis not present

## 2019-04-01 DIAGNOSIS — I10 Essential (primary) hypertension: Secondary | ICD-10-CM

## 2019-04-01 DIAGNOSIS — I272 Pulmonary hypertension, unspecified: Secondary | ICD-10-CM

## 2019-04-01 DIAGNOSIS — R0602 Shortness of breath: Secondary | ICD-10-CM | POA: Diagnosis not present

## 2019-04-01 MED ORDER — LEVOFLOXACIN 500 MG PO TABS: 500.0000 mg | ORAL_TABLET | Freq: Every day | ORAL | 0 refills | Status: DC

## 2019-04-01 NOTE — Telephone Encounter (Signed)
Spoke with patients sister POA and reviewed that medication had been sent in and that we would give her a call with lab and xray results tomorrow or Thursday. She verbalized understanding with no further questions at this time. She was appreciative for the call and had no other concerns at this time.

## 2019-04-01 NOTE — Telephone Encounter (Signed)
Left voicemail message that Levaquin 500 mg sent in with instructions to take once daily for 14 days and that results are still pending and that I would call back with that information.

## 2019-04-01 NOTE — Patient Instructions (Addendum)
Medication Instructions:  Your physician has recommended you make the following change in your medication:  1. INCREASE Furosemide (Lasix) 40 mg twice a day for weight over 225 pounds. THEN go back to Furosemide 40 mg twice a day alternating with 40 mg daily 2. START Levaquin 500 mg once daily for 14 days  If you need a refill on your cardiac medications before your next appointment, please call your pharmacy.    Lab work: BMP done today   If you have labs (blood work) drawn today and your tests are completely normal, you will receive your results only by: Marland Kitchen MyChart Message (if you have MyChart) OR . A paper copy in the mail If you have any lab test that is abnormal or we need to change your treatment, we will call you to review the results.   Testing/Procedures:  Chest x-ray   Follow-Up: At [facility], you and your health needs are our priority.  As part of our continuing mission to provide you with exceptional heart care, we have created designated Provider Care Teams.  These Care Teams include your primary Cardiologist (physician) and Advanced Practice Providers (APPs -  Physician Assistants and Nurse Practitioners) who all work together to provide you with the care you need, when you need it.  . You will need a follow up appointment in 3 months  . Providers on your designated Care Team:   . Murray Hodgkins, NP . Christell Faith, PA-C . Marrianne Mood, PA-C  Any Other Special Instructions Will Be Listed Below (If Applicable).  For educational health videos Log in to : [email or web address] Or : [email or web address], password : [email or web address]

## 2019-04-01 NOTE — Progress Notes (Signed)
Cardiology Office Note  Date:  04/01/2019   ID:  Dustin [name], DOB Apr 01, 1956, MRN [id number]  PCP:  McLean-Scocuzza, Pasty Spillersracy N, MD   Chief Complaint  Patient presents with  . Other    Patient c/o Increased SOB, Swelling, and Weight Gain. Meds reviewed verbally with patient.     HPI:  Dustin [name] is a 63 y.o. male with long history of  smoking at least 30 years, quit September 2017, emphysema,  chronic diastolic CHF,  Obstructive sleep apnea, Wears CPAP Stress test 05/2016: no ischemia On CT scan, minimial if any CAD, PAD. Minimal aortic arch atherosclerosis DVT 02/2018, small PE who presents to the office for follow-up of his shortness of breath, COPD Exacerbation  Legs feeling heavy Recent fall Presents with his daughter Reports coughing has been worse, significant green sputum " Enough to fill a cup" Coughing in the room today, into napkins, large amounts of green thick sputum  Weight at home 230.7 Prior weight 218 per the patient  Missed Lasix for 3 days last week for a wedding Got back on it and ankle swelling has dramatically improved  Review of notes indicates he did short burst of prednisone and Levaquin  Has not been in the hospital for over a year Chronic oxygen supplemental typically 2 L  EKG personally reviewed by myself on todays visit Shows sinus tachycardia with rate 108 bpm right bundle branch block  Other past medical history reviewed In the hospital 01/2018 with COPD exacerbation  Seen in the emergency room 02/26/2018 for leg swelling swelling in bilateral legs right greater than left and he had an outpatient ultrasound which was positive for DVT.   small pulmonary embolism   Admission 01/2017 COPD exacerbation, PNA One week in hospital  CT scan chest /ABD reviewed with him in the office today:  Images pulled up minimial if any CAD, PAD. Minimal aortic arch atherosclerosis  Hospital admission June 22 2016  discharge October 10 for COPD  exacerbation with hypoxia. Notes indicating no significant CHF Discharge with antibiotics, prednisone taper  PMH:   has a past medical history of Allergy, Anxiety, Asthma, Chronic diastolic CHF (congestive heart failure) (HCC), Chronic respiratory failure (HCC), COPD (chronic obstructive pulmonary disease) (HCC), Depression, Emphysema of lung (HCC), GERD (gastroesophageal reflux disease), HCAP (healthcare-associated pneumonia), Headache, Hypertension, Leg DVT (deep venous thromboembolism), acute, bilateral (HCC), Personal history of tobacco use, presenting hazards to health (08/17/2015), Pulmonary embolism (HCC), Pulmonary HTN (HCC), and Tobacco abuse.  PSH:    Past Surgical History:  Procedure Laterality Date  . ABDOMINAL SURGERY    . ADENOIDECTOMY    . CARPAL TUNNEL RELEASE Right   . corpal tunnel Right   . ELBOW SURGERY Right    repaired tendon  . hemmorhoid N/A   . HEMORRHOID SURGERY    . NASAL SINUS SURGERY    . NASAL SINUS SURGERY     1970s  . NOSE SURGERY    . reflux surgery     1994  . ROTATOR CUFF REPAIR Right   . SHOULDER SURGERY    . TENNIS ELBOW RELEASE/NIRSCHEL PROCEDURE Right   . TONSILLECTOMY    . URETHRA SURGERY     surgery 6 times from age [age] yrs old  . URETHRA SURGERY      Current Outpatient Medications  Medication Sig Dispense Refill  . ALPRAZolam (XANAX) 1 MG tablet Take 1 tablet (1 mg total) by mouth 3 (three) times daily as needed for anxiety. 90 tablet 3  .  AMBULATORY NON FORMULARY MEDICATION 1 each by Does not apply route as needed. Please provide patient with "So Clean" CPAP cleaning device to be used as needed. 1 Device 0  . antiseptic oral rinse (BIOTENE) LIQD 15 mLs by Mouth Rinse route daily as needed for dry mouth. 473 mL 12  . apixaban (ELIQUIS) 5 MG TABS tablet Take 1 tablet (5 mg total) by mouth 2 (two) times daily. 180 tablet 3  . carbamide peroxide (DEBROX) 6.5 % OTIC solution Place 5 drops into both ears 2 (two) times daily. Prn x 4-7 days 15  mL 11  . Cholecalciferol (VITAMIN D) 2000 units tablet Take 2,000 Units by mouth daily.    . clobetasol (TEMOVATE) 0.05 % external solution Apply 1 application topically 2 (two) times daily. Prn to scalp and ears 50 mL 11  . diltiazem (CARDIZEM) 30 MG tablet Take 1 tablet (30 mg total) by mouth 3 (three) times daily as needed. 180 tablet 3  . escitalopram (LEXAPRO) 20 MG tablet Take 1 tablet (20 mg total) by mouth at bedtime. 90 tablet 3  . fluticasone (FLONASE) 50 MCG/ACT nasal spray Place 2 sprays into both nostrils daily as needed for allergies or rhinitis. 16 g 11  . furosemide (LASIX) 40 MG tablet Take 1 tablet (40 mg total) by mouth daily. In am 90 tablet 3  . gabapentin (NEURONTIN) 300 MG capsule TAKE 1 TO 2 CAPSULES BY MOUTH THREE TIMES DAILY 520 capsule 11  . hydrocortisone 2.5 % cream Apply topically 2 (two) times daily as needed. face 30 g 11  . ipratropium-albuterol (DUONEB) 0.5-2.5 (3) MG/3ML SOLN Take 3 mLs by nebulization every 6 (six) hours as needed. 1080 mL 3  . ketoconazole (NIZORAL) 2 % shampoo Apply 1 application topically 2 (two) times a week. Let stand for 5 minutes 120 mL 3  . magnesium oxide (MAG-OX) 400 MG tablet Take 400 mg by mouth daily.    . mometasone-formoterol (DULERA) 200-5 MCG/ACT AERO Inhale 2 puffs into the lungs 2 (two) times daily. 1 Inhaler 4  . mupirocin ointment (BACTROBAN) 2 % Apply 1 application topically 2 (two) times daily. Use left upper arm  And left ear x 1-2 weeks 30 g 2  . nystatin (MYCOSTATIN) 100000 UNIT/ML suspension Use as directed 5 mLs (500,000 Units total) in the mouth or throat 4 (four) times daily. Swish and spit 3 or 4 times daily for at least 5 days.  Then may be used as needed thereafter for thrush 473 mL 0  . potassium chloride (K-DUR) 10 MEQ tablet Take 1-2 tablets (10-20 mEq total) by mouth daily. Repeat if second LASIX taken 180 tablet 3  . Probiotic Product (PROBIOTIC PO) Take by mouth.    Marland Kitchen. rOPINIRole (REQUIP) 0.25 MG tablet Take 1  tablet (0.25 mg total) by mouth 2 (two) times daily. 180 tablet 3  . sodium chloride (OCEAN) 0.65 % SOLN nasal spray Place 1-2 sprays into both nostrils as needed for congestion. 480 mL 12  . Spacer/Aero-Holding Chambers (OPTICHAMBER ADVANTAGE-LG MASK) MISC Use as directed with inhaler diag  j44.1 2 each 1  . theophylline (UNIPHYL) 400 MG 24 hr tablet Take 1 tablet (400 mg total) by mouth every morning. 90 tablet 3  . Tiotropium Bromide Monohydrate (SPIRIVA RESPIMAT) 2.5 MCG/ACT AERS Inhale 2 puffs into the lungs daily. 3 Inhaler 3  . traZODone (DESYREL) 50 MG tablet Take 1-2 tablets (50-100 mg total) by mouth at bedtime as needed for sleep. 60 tablet 11   No  current facility-administered medications for this visit.      Allergies:   Asa [aspirin], Bee venom, Codeine, Ibuprofen, and Iodinated diagnostic agents   Social History:  The patient  reports that he quit smoking about 14 months ago. His smoking use included cigarettes. He has a 80.00 pack-year smoking history. He has never used smokeless tobacco. He reports that he does not drink alcohol or use drugs.   Family History:   family history includes Alcohol abuse in his daughter; Arthritis in his daughter, father, and mother; Asthma in his daughter and mother; Bipolar disorder in his mother; Birth defects in his brother, paternal grandmother, and sister; CAD in his father; Depression in his daughter, mother, and paternal grandmother; Drug abuse in his daughter; Hearing loss in his father and mother; Heart disease in his brother, father, mother, and paternal grandmother; Hyperlipidemia in his father and mother; Hypertension in his father and mother; Intellectual disability in his daughter; Kidney disease in his mother and son; Malignant hypertension in his mother; Miscarriages / Stillbirths in his daughter; Peripheral Artery Disease in his mother; Rheum arthritis in his mother; Stroke in his father.    Review of Systems: Review of Systems   Constitutional: Negative.   Respiratory: Positive for cough and shortness of breath.        Congestion  Cardiovascular: Negative.   Gastrointestinal: Negative.   Musculoskeletal: Negative.   Neurological: Negative.   Psychiatric/Behavioral: Negative.   All other systems reviewed and are negative.    PHYSICAL EXAM: VS:  BP 120/76 (BP Location: Left Arm, Patient Position: Sitting, Cuff Size: Normal)   Pulse (!) 108   Ht 5\' 11"  (1.803 m)   Wt 230 lb 7 oz (104.5 kg)   BMI 32.14 kg/m  , BMI Body mass index is 32.14 kg/m.  Constitutional:  oriented to person, place, and time. No distress.  Coughing, presenting in a wheelchair on nasal cannula oxygen HENT:  Head: Grossly normal Eyes:  no discharge. No scleral icterus.  Neck: Unable to estimate JVD, no carotid bruits  Cardiovascular: Regular rate and rhythm, no murmurs appreciated Trace pitting lower extremity edema Pulmonary/Chest: Coarse breath sounds bilaterally, rales Abdominal: Soft.  no distension.  no tenderness.  Musculoskeletal: Normal range of motion Neurological:  normal muscle tone. Coordination normal. No atrophy Skin: Skin warm and dry Psychiatric: normal affect, pleasant   Recent Labs: 05/22/2018: TSH 2.08 12/06/2018: ALT 13; BUN 20; Creatinine, Ser 1.32; Magnesium 2.2; Potassium 5.1; Sodium 146    Lipid Panel Lab Results  Component Value Date   CHOL 159 05/22/2018   HDL 54.30 05/22/2018   LDLCALC 85 05/22/2018   TRIG 96.0 05/22/2018      Wt Readings from Last 3 Encounters:  04/01/19 230 lb 7 oz (104.5 kg)  12/06/18 227 lb 12.8 oz (103.3 kg)  11/15/18 226 lb 9.6 oz (102.8 kg)      ASSESSMENT AND PLAN:   Essential hypertension - Plan: EKG 12-Lead Blood pressure is well controlled on today's visit. No changes made to the medications.  SOB (shortness of breath) - Plan: EKG 12-Lead Severe COPD on chronic oxygen Likely acute on chronic bronchitis given amount of green productive sputum X-ray  today Pulmonary is out of the office for several days -We will start Levaquin 500 mg daily, follow-up with pulmonary Extra Lasix as detailed below  DVT bilateral leg Would likely require indefinite eliquis  twice a day Tolerating anticoagulation  Chronic diastolic heart failure ([place]) He reports weight is up and with worsening  shortness of breath suggested he try several days of Lasix 40 twice daily then go back to 40 twice daily alternating with 40 daily BMP today  COPD exacerbation (HCC) Chronic issue Seems to have acute on chronic bronchitis on today's visit We will discuss with pulmonary  Tobacco use disorder Reports he no longer smokes  long discussion with him concerning recent findings Worsening bronchitis symptoms  Total encounter time more than 45 minutes  Greater than 50% was spent in counseling and coordination of care with the patient   Disposition:   F/U  3 months   Orders Placed This Encounter  Procedures  . DG Chest 2 View  . Basic metabolic panel  . EKG 12-Lead    Signed, Dossie Arbourim Gollan, M.D., Ph.D. 04/01/2019  [facility] [place] [phone]

## 2019-04-02 ENCOUNTER — Other Ambulatory Visit: Payer: Self-pay

## 2019-04-02 ENCOUNTER — Telehealth: Payer: Self-pay | Admitting: Cardiovascular Disease

## 2019-04-02 LAB — BASIC METABOLIC PANEL
BUN/Creatinine Ratio: 10 (ref 10–24)
BUN: 11 mg/dL (ref 8–27)
CO2: 33 mmol/L — ABNORMAL HIGH (ref 20–29)
Calcium: 9.5 mg/dL (ref 8.6–10.2)
Chloride: 96 mmol/L (ref 96–106)
Creatinine, Ser: 1.11 mg/dL (ref 0.76–1.27)
GFR calc Af Amer: 80 mL/min/{1.73_m2} (ref >59)
GFR calc non Af Amer: 69 mL/min/{1.73_m2} (ref >59)
Glucose: 81 mg/dL (ref 65–99)
Potassium: 4.5 mmol/L (ref 3.5–5.2)
Sodium: 145 mmol/L — ABNORMAL HIGH (ref 134–144)

## 2019-04-02 MED ORDER — LEVOFLOXACIN 500 MG PO TABS: 500.0000 mg | ORAL_TABLET | Freq: Every day | ORAL | 0 refills | Status: DC

## 2019-04-02 NOTE — Telephone Encounter (Signed)
Medication resent to correct pharmacy.  levofloxacin (LEVAQUIN) 500 MG tablet 14 tablet 0 04/02/2019    Sig - Route: Take 1 tablet (500 mg total) by mouth daily. - Oral   Sent to pharmacy as: levofloxacin (LEVAQUIN) 500 MG tablet   E-Prescribing Status: Receipt confirmed by pharmacy (04/02/2019 1:57 PM EDT)   [id number] - [place], [place]

## 2019-04-02 NOTE — Telephone Encounter (Signed)
Patient calling in due to his pharmacy not having the prescription for Levaquin 500 mg once daily for 14 days. He needs this to be sent to [facility] on [address] in [place]

## 2019-04-09 ENCOUNTER — Telehealth: Payer: Self-pay | Admitting: Internal Medicine

## 2019-04-09 ENCOUNTER — Other Ambulatory Visit: Payer: Self-pay | Admitting: Internal Medicine

## 2019-04-09 DIAGNOSIS — F419 Anxiety disorder, unspecified: Secondary | ICD-10-CM

## 2019-04-09 MED ORDER — ALPRAZOLAM 1 MG PO TABS: 1.0000 mg | ORAL_TABLET | Freq: Three times a day (TID) | ORAL | 3 refills | Status: DC | PRN

## 2019-04-09 NOTE — Telephone Encounter (Signed)
Medication:  ALPRAZolam Duanne Moron) 1 MG tablet  Patient is requesting refill    Pharmacy:  [place], [place] [employer] [phone] (Phone) [phone] (Fax)

## 2019-04-09 NOTE — Telephone Encounter (Signed)
Last OV:  03/12/19  Last Refill:  09/27/18

## 2019-04-10 ENCOUNTER — Other Ambulatory Visit: Payer: Self-pay | Admitting: Cardiovascular Disease

## 2019-04-10 ENCOUNTER — Ambulatory Visit: Payer: Medicare Other | Admitting: Pharmacist

## 2019-04-10 ENCOUNTER — Other Ambulatory Visit: Payer: Self-pay | Admitting: Internal Medicine

## 2019-04-10 DIAGNOSIS — J449 Chronic obstructive pulmonary disease, unspecified: Secondary | ICD-10-CM

## 2019-04-10 DIAGNOSIS — I5032 Chronic diastolic (congestive) heart failure: Secondary | ICD-10-CM

## 2019-04-10 DIAGNOSIS — J432 Centrilobular emphysema: Secondary | ICD-10-CM

## 2019-04-10 MED ORDER — IPRATROPIUM-ALBUTEROL 0.5-2.5 (3) MG/3ML IN SOLN: 3.0000 mL | Freq: Four times a day (QID) | RESPIRATORY_TRACT | 3 refills | Status: AC | PRN

## 2019-04-10 NOTE — Patient Instructions (Signed)
Visit Information  Goals Addressed            This Visit's Progress     Patient Stated   . "I want to stay well" (pt-stated)       Current Barriers:  . Polypharmacy - patient with COPD, CHF, Afib, anxiety . Financial concerns- resolved; patient is receiving Dulera ([employer]), Spiriva ([employer]), and Eliquis ([employer]) patient assistance through 09/18/2019. Confirms that he received 3 month supply of Spiriva in the mail . COPD; follows with Dr. [name], managed on [facility] + Spiriva, azithromycin 250 QOD, theophylline 400 mg QAM, Duonebs PRN. Currently completing 14 day course of levofloxacin  . HFrEF, follows with Dr. Rockey Situ o Weight this morning 227; notes that he has been taking furosemide alternating - BID, then QAM, then BID. Continues to weigh daily.  o Notes that his legs still feel "heavy"  Pharmacist Clinical Goal(s):  Marland Kitchen Over the next 90 days, patient will work with PharmD, primary care provider, and specialty providers to address needs related to optimized medication management  Interventions: . Comprehensive medication review performed. . Reviewed Dr. Donivan Scull office visit from 04/01/2019 with patient. Re-educated that he is to take furosemide BID if AM weight >225, then resume alternating schedule. Patient verbalized understanding of this instruction.   Patient Self Care Activities:  . Self administers medications as prescribed  Initial goal documentation        The patient verbalized understanding of instructions provided today and declined a print copy of patient instruction materials.   Plan: - Will outreach patient in ~2 weeks for continued medication management support   Catie Darnelle Maffucci, PharmD, [facility] Pharmacist [place] [phone]

## 2019-04-10 NOTE — Chronic Care Management (AMB) (Signed)
Chronic Care Management   Note  04/10/2019 Name: Dustin [name] MRN: [id number] DOB: 02-Mar-1956   Subjective:  Dustin [name] is a 63 y.o. year old male who is a primary care patient of McLean-Scocuzza, Nino Glow, MD. The CCM team was consulted for assistance with chronic disease management and care coordination needs.     Review of patient status, including review of consultants reports, laboratory and other test data, was performed as part of comprehensive evaluation and provision of chronic care management services.   Objective:  Lab Results  Component Value Date   CREATININE 1.11 04/01/2019   CREATININE 1.32 12/06/2018   CREATININE 1.21 05/22/2018    Lab Results  Component Value Date   HGBA1C 5.6 05/22/2018       Component Value Date/Time   CHOL 159 05/22/2018 1237   CHOL 142 09/24/2013 1027   TRIG 96.0 05/22/2018 1237   TRIG 74 09/24/2013 1027   HDL 54.30 05/22/2018 1237   HDL 56 09/24/2013 1027   CHOLHDL 3 05/22/2018 1237   VLDL 19.2 05/22/2018 1237   VLDL 15 09/24/2013 1027   LDLCALC 85 05/22/2018 1237   LDLCALC 71 09/24/2013 1027    Clinical ASCVD: No  The 10-year ASCVD risk score Mikey Bussing DC Jr., et al., 2013) is: 10.8%   Values used to calculate the score:     Age: 69 years     Sex: Male     Is Non-Hispanic African American: No     Diabetic: No     Tobacco smoker: No     Systolic Blood Pressure: 427 mmHg     Is BP treated: Yes     HDL Cholesterol: 54.3 mg/dL     Total Cholesterol: 159 mg/dL    BP Readings from Last 3 Encounters:  04/01/19 120/76  12/06/18 110/70  11/15/18 122/78    Allergies  Allergen Reactions  . Asa [Aspirin] Other (See Comments)    Reaction: swelling of the right side.  . Bee Venom Swelling  . Codeine Other (See Comments)    Reaction: Difficulty breathing  . Ibuprofen Other (See Comments)    Reaction: Swelling of the right side.  . Iodinated Diagnostic Agents Other (See Comments)    Reaction:  Unknown  Other  reaction(s): Unknown    Medications Reviewed Today    Reviewed by De Hollingshead, [facility] (Pharmacist) on 04/10/19 at 1042  Med List Status: <None>  Medication Order Taking? Sig Documenting Provider Last Dose Status Informant  Acetylcysteine (NAC PO) [id number] Yes Take 1 tablet by mouth daily. [provider] Taking Active   ALPRAZolam Duanne Moron) 1 MG tablet [id number] Yes Take 1 tablet (1 mg total) by mouth 3 (three) times daily as needed for anxiety. McLean-Scocuzza, Nino Glow, MD Taking Active   AMBULATORY NON FORMULARY MEDICATION [id number] No 1 each by Does not apply route as needed. Please provide patient with "So Clean" CPAP cleaning device to be used as needed.  Patient not taking: Reported on 04/10/2019   Wilhelmina Mcardle, MD Not Taking Active   antiseptic oral rinse Charlene Brooke) LIQD [id number] No 15 mLs by Mouth Rinse route daily as needed for dry mouth.  Patient not taking: Reported on 04/10/2019   McLean-Scocuzza, Nino Glow, MD Not Taking Active   apixaban (ELIQUIS) 5 MG TABS tablet [id number] Yes Take 1 tablet (5 mg total) by mouth 2 (two) times daily. McLean-Scocuzza, Nino Glow, MD Taking Active   azithromycin (ZITHROMAX) 250 MG tablet [id number] Yes Take 250 mg by mouth  every other day. [provider] Taking Active   b complex vitamins capsule [id number] Yes Take 1 capsule by mouth daily. [provider] Taking Active   carbamide peroxide (DEBROX) 6.5 % OTIC solution [id number] Yes Place 5 drops into both ears 2 (two) times daily. Prn x 4-7 days McLean-Scocuzza, Pasty Spillersracy N, MD Taking Active   Cholecalciferol (VITAMIN D) 2000 units tablet [id number] Yes Take 2,000 Units by mouth 2 (two) times a day.  [provider] Taking Active   clobetasol (TEMOVATE) 0.05 % external solution [id number] Yes Apply 1 application topically 2 (two) times daily. Prn to scalp and ears McLean-Scocuzza, Pasty Spillersracy N, MD Taking Active   diltiazem (CARDIZEM) 30 MG tablet [id number] No Take 1 tablet  (30 mg total) by mouth 3 (three) times daily as needed.  Patient not taking: Reported on 04/10/2019   McLean-Scocuzza, Pasty Spillersracy N, MD Not Taking Active   escitalopram (LEXAPRO) 20 MG tablet [id number] Yes Take 1 tablet (20 mg total) by mouth at bedtime. McLean-Scocuzza, Pasty Spillersracy N, MD Taking Active   fluticasone [facility](FLONASE) 50 MCG/ACT nasal spray [id number] Yes Place 2 sprays into both nostrils daily as needed for allergies or rhinitis. McLean-Scocuzza, Pasty Spillersracy N, MD Taking Active   furosemide (LASIX) 40 MG tablet [id number] Yes Take 1 tablet (40 mg total) by mouth daily. In am McLean-Scocuzza, Pasty Spillersracy N, MD Taking Active            Med Note Feliz Beam(TRAVIS, Andris FlurryCATHERINE E   Thu Apr 10, 2019 10:37 AM) Alternates 1 QD, BID  gabapentin (NEURONTIN) 300 MG capsule [id number] Yes TAKE 1 TO 2 CAPSULES BY MOUTH THREE TIMES DAILY McLean-Scocuzza, Pasty Spillersracy N, MD Taking Active            Med Note Feliz Beam(TRAVIS, Arie SabinaCATHERINE E   Thu Apr 10, 2019 10:38 AM) 1 QAM, 2 noon, 1 QPM  hydrocortisone 2.5 % cream [id number] Yes Apply topically 2 (two) times daily as needed. face McLean-Scocuzza, Pasty Spillersracy N, MD Taking Active   ipratropium-albuterol (DUONEB) 0.5-2.5 (3) MG/3ML SOLN [id number] Yes Take 3 mLs by nebulization every 6 (six) hours as needed. McLean-Scocuzza, Pasty Spillersracy N, MD Taking Active   ketoconazole (NIZORAL) 2 % shampoo [id number] Yes Apply 1 application topically 2 (two) times a week. Let stand for 5 minutes McLean-Scocuzza, Pasty Spillersracy N, MD Taking Active   levofloxacin (LEVAQUIN) 500 MG tablet [id number] Yes Take 1 tablet (500 mg total) by mouth daily. Antonieta IbaGollan, Timothy J, MD Taking Active   magnesium oxide (MAG-OX) 400 MG tablet [id number] Yes Take 400 mg by mouth daily. [provider] Taking Active Self  mometasone-formoterol (DULERA) 200-5 MCG/ACT Sandrea MatteAERO [id number] Yes Inhale 2 puffs into the lungs 2 (two) times daily. Merwyn KatosSimonds, David B, MD Taking Active   mupirocin ointment (BACTROBAN) 2 % [id number] Yes Apply 1 application topically 2 (two) times  daily. Use left upper arm  And left ear x 1-2 weeks McLean-Scocuzza, Pasty Spillersracy N, MD Taking Active   nystatin (MYCOSTATIN) 100000 UNIT/ML suspension [id number] Yes Use as directed 5 mLs (500,000 Units total) in the mouth or throat 4 (four) times daily. Swish and spit 3 or 4 times daily for at least 5 days.  Then may be used as needed thereafter for thrush Merwyn KatosSimonds, David B, MD Taking Active   potassium chloride (K-DUR) 10 MEQ tablet [id number] Yes Take 1-2 tablets (10-20 mEq total) by mouth daily. Repeat if second LASIX taken McLean-Scocuzza, Pasty Spillersracy N, MD Taking Active   Probiotic Product (PROBIOTIC PO) [id number] Yes Take 1 capsule by mouth 2 (two) times a  day.  [provider] Taking Active   rOPINIRole (REQUIP) 0.25 MG tablet [id number] Yes Take 1 tablet (0.25 mg total) by mouth 2 (two) times daily. McLean-Scocuzza, Pasty Spillersracy N, MD Taking Active   sodium chloride (OCEAN) 0.65 % SOLN nasal spray [id number] Yes Place 1-2 sprays into both nostrils as needed for congestion. McLean-Scocuzza, Pasty Spillersracy N, MD Taking Active   Spacer/Aero-Holding Deretha [facility](OPTICHAMBER ADVANTAGE-[employer] [place]) [place] [id number] Yes Use as directed with inhaler diag  j44.1 Yevonne PaxKhan, Saadat A, MD Taking Active Self  theophylline (UNIPHYL) 400 MG 24 hr tablet [id number] Yes Take 1 tablet (400 mg total) by mouth every morning. Merwyn KatosSimonds, David B, MD Taking Active   Tiotropium Bromide Monohydrate (SPIRIVA RESPIMAT) 2.5 MCG/ACT AERS [id number] Yes Inhale 2 puffs into the lungs daily. Merwyn KatosSimonds, David B, MD Taking Active   traZODone (DESYREL) 50 MG tablet [id number] Yes Take 1-2 tablets (50-100 mg total) by mouth at bedtime as needed for sleep. McLean-Scocuzza, Pasty Spillersracy N, MD Taking Active            Med Note Feliz Beam(TRAVIS, Andris FlurryCATHERINE E   Thu Apr 10, 2019 10:39 AM) PRN           Assessment:   Goals Addressed            This Visit's Progress     Patient Stated   . "I want to stay well" (pt-stated)       Current Barriers:  . Polypharmacy - patient with COPD,  CHF, Afib, anxiety . Financial concerns- resolved; patient is receiving Dulera (Merck), Spiriva (BI), and Eliquis (BMS) patient assistance through 09/18/2019. Confirms that he received 3 month supply of Spiriva in the mail . COPD; follows with Dr. Sung AmabileSimonds, [facility], azithromycin 250 QOD, theophylline 400 mg QAM, Duonebs PRN. Currently completing 14 day course of levofloxacin  . HFrEF, follows with Dr. Mariah MillingGollan o Weight this morning 227; notes that he has been taking furosemide alternating - BID, then QAM, then BID. Continues to weigh daily.  o Notes that his legs still feel "heavy"  Pharmacist Clinical Goal(s):  Marland Kitchen. Over the next 90 days, patient will work with PharmD, primary care provider, and specialty providers to address needs related to optimized medication management  Interventions: . Comprehensive medication review performed. . Reviewed Dr. Windell HummingbirdGollan's office visit from 04/01/2019 with patient. Re-educated that he is to take furosemide BID if AM weight >225, then resume alternating schedule. Patient verbalized understanding of this instruction.   Patient Self Care Activities:  . Self administers medications as prescribed  Initial goal documentation        Plan: - Will outreach patient in ~2 weeks for continued medication management support   Catie Feliz Beamravis, PharmD, CPP Clinical Pharmacist [name] [phone]

## 2019-04-11 ENCOUNTER — Other Ambulatory Visit: Payer: Self-pay | Admitting: Internal Medicine

## 2019-04-11 DIAGNOSIS — M62838 Other muscle spasm: Secondary | ICD-10-CM

## 2019-04-11 MED ORDER — GABAPENTIN 300 MG PO CAPS: ORAL_CAPSULE | ORAL | 11 refills | Status: AC

## 2019-04-14 ENCOUNTER — Telehealth: Payer: Self-pay

## 2019-04-14 DIAGNOSIS — F419 Anxiety disorder, unspecified: Secondary | ICD-10-CM

## 2019-04-14 MED ORDER — ALPRAZOLAM 1 MG PO TABS: 1.0000 mg | ORAL_TABLET | Freq: Three times a day (TID) | ORAL | 0 refills | Status: DC | PRN

## 2019-04-14 NOTE — Telephone Encounter (Signed)
Thanks for covering  I work on Mondays so if urgent issues need to be addressed the clinic can call my cell phone  Not sure why his mail order is delayed   Take care [place]

## 2019-04-14 NOTE — Telephone Encounter (Signed)
Noted. I will send in a small amount to cover for the next 2 days, though If his symptoms do not improve with restarting his xanax he needs to be seen at an urgent care for in person evaluation. I will forward to his PCP as an FYI as well.

## 2019-04-14 NOTE — Addendum Note (Signed)
Addended by: Leone Haven on: 04/14/2019 03:23 PM   Modules accepted: Orders

## 2019-04-14 NOTE — Telephone Encounter (Signed)
Pt's daughter, Lizabeth Leyden came into office to see if pt could get a refill on Xanax.  Pt's daughter said that pt has been w/o this medication for 2 days.  Xanax was sent in for refill at [facility] mail order.  Pt's daughter said that [facility] refill was mailed out on 7/22 with an expected date of arrival on last Saturday but pt hasn't received Rx yet.  Pt's daughter said that pt is having a panic attack and SOB from anxiety and body shakes from being anxious.  Pt's daughter said that pt has been on Xanax for 10 years and has never been w/o this med.  Pt's daughter said pt is sitting in a chair in front of the door waiting on his medication.  Called and spoke to pt.  Pt said that he was feeling "all shook up" and was very anxious and was experiencing intermittent SOB from feeling anxious.  Pt said that he is sleeping ok. Pt said that he ate yesterday but did not eat today.  Pt c/o legs feeling heavy.  No diarrhea but feels like he may would have it if he got up and walked around.  Dry mouth and a headache that starts and goes away.  Informed pt and daughter that PCP is out-of-the-office today but will forward notes to a covering provider to see if possibly a few pills can be called into the pharmacy until mail order Rx arrives.

## 2019-04-14 NOTE — Telephone Encounter (Signed)
Pt aware.

## 2019-04-15 ENCOUNTER — Other Ambulatory Visit: Payer: Self-pay | Admitting: Internal Medicine

## 2019-04-15 DIAGNOSIS — F419 Anxiety disorder, unspecified: Secondary | ICD-10-CM

## 2019-04-15 MED ORDER — ALPRAZOLAM 1 MG PO TABS: 1.0000 mg | ORAL_TABLET | Freq: Three times a day (TID) | ORAL | 0 refills | Status: DC | PRN

## 2019-04-15 NOTE — Telephone Encounter (Signed)
Sent enough to get him to Friday  [place]

## 2019-04-15 NOTE — Telephone Encounter (Signed)
Mark with [facility], called in to request to have another small quantity script sent to his local pharmacy for ALPRAZolam Duanne Moron) 1 MG tablet. Pt says that he was told by his mail order that his medication wouldn't be delivered to him until Friday. Pt will be out of his medication by then.   Pharmacy:  [id number] [address], [place] [phone] (Phone) [phone] (Fax)    Please assist.

## 2019-04-17 ENCOUNTER — Other Ambulatory Visit: Payer: Self-pay | Admitting: Internal Medicine

## 2019-04-17 DIAGNOSIS — F419 Anxiety disorder, unspecified: Secondary | ICD-10-CM

## 2019-04-17 MED ORDER — ALPRAZOLAM 1 MG PO TABS: 1.0000 mg | ORAL_TABLET | Freq: Three times a day (TID) | ORAL | 0 refills | Status: AC | PRN

## 2019-04-17 NOTE — Telephone Encounter (Signed)
[place], with [facility], calling on behalf of patient stating that mail order of this RX has been shipped but has an estimated arrival date of 08/05. She inquired if patient can have another partial fill.

## 2019-04-19 DIAGNOSIS — J471 Bronchiectasis with (acute) exacerbation: Secondary | ICD-10-CM | POA: Diagnosis not present

## 2019-04-19 DIAGNOSIS — J449 Chronic obstructive pulmonary disease, unspecified: Secondary | ICD-10-CM | POA: Diagnosis not present

## 2019-04-24 ENCOUNTER — Ambulatory Visit: Payer: Medicare Other | Admitting: Pharmacist

## 2019-04-24 DIAGNOSIS — J449 Chronic obstructive pulmonary disease, unspecified: Secondary | ICD-10-CM

## 2019-04-24 DIAGNOSIS — I5032 Chronic diastolic (congestive) heart failure: Secondary | ICD-10-CM

## 2019-04-24 NOTE — Chronic Care Management (AMB) (Signed)
  Chronic Care Management   Follow Up Note   04/24/2019 Name: Dustin [name] MRN: [id number] DOB: 02-12-1956  Referred by: McLean-Scocuzza, Nino Glow, MD Reason for referral : Chronic Care Management (Medication Management)   Dustin [name] is a 63 y.o. year old male who is a primary care patient of McLean-Scocuzza, Nino Glow, MD. The CCM team was consulted for assistance with chronic disease management and care coordination needs.    Contacted patient today for medication management follow up.   Review of patient status, including review of consultants reports, relevant laboratory and other test results, and collaboration with appropriate care team members and the patient's provider was performed as part of comprehensive patient evaluation and provision of chronic care management services.    Goals Addressed            This Visit's Progress     Patient Stated   . "I want to stay well" (pt-stated)       Current Barriers:  . Polypharmacy - patient with COPD, CHF, Afib, anxiety . Financial concerns- resolved; patient is receiving Dulera (Merck), Spiriva (BI), and Eliquis (BMS) patient assistance through 09/18/2019. Marland Kitchen COPD; follows with Dr. [name], though has not been seen since 10/2018, managed on Dulera + Spiriva, azithromycin 250 QOD, theophylline 400 mg QAM, Duonebs PRN. Notes that he will need a refill of azithromycin soon o Patient reports increased eye watering recently; has been using fluticasone intranasal daily since ~11/2018 . HFrEF, follows with Dr. Rockey Situ o Weight has been maintained at 225-227, 226 lbs today. Alternating furosemide QD, BID.  Pharmacist Clinical Goal(s):  Marland Kitchen Over the next 90 days, patient will work with PharmD, primary care provider, and specialty providers to address needs related to optimized medication management  Interventions: . Comprehensive medication review performed. . Reviewed Dr. [name]' note; patient was due for follow up ~01/2019. Encouraged  patient to schedule follow up. He noted that he would. . Recommended addition of systemic antihistamine (Zyrtec, Xyzal) to daily fluticasone if allergies are more troublesome right now. Patient verbalized understanding.  . Praised patient for continued maintenance of near-goal weight with current diuretic regimen.   Patient Self Care Activities:  . Self administers medications as prescribed  Please see past updates related to this goal by clicking on the "Past Updates" button in the selected goal         Plan:  - Will outreach patient in 4-5 weeks for continued medication management support  Catie Darnelle Maffucci, PharmD, [place] Pharmacist [facility] [phone]

## 2019-04-24 NOTE — Patient Instructions (Signed)
Visit Information  Goals Addressed            This Visit's Progress     Patient Stated   . "I want to stay well" (pt-stated)       Current Barriers:  . Polypharmacy - patient with COPD, CHF, Afib, anxiety . Financial concerns- resolved; patient is receiving Dulera (Merck), Spiriva (BI), and Eliquis (BMS) patient assistance through 09/18/2019. Marland Kitchen COPD; follows with Dr. [name], though has not been seen since 10/2018, managed on Dulera + Spiriva, azithromycin 250 QOD, theophylline 400 mg QAM, Duonebs PRN. Notes that he will need a refill of azithromycin soon o Patient reports increased eye watering recently; has been using fluticasone intranasal daily since ~11/2018 . HFrEF, follows with Dr. Rockey Situ o Weight has been maintained at 225-227, 226 lbs today. Alternating furosemide QD, BID.  Pharmacist Clinical Goal(s):  Marland Kitchen Over the next 90 days, patient will work with PharmD, primary care provider, and specialty providers to address needs related to optimized medication management  Interventions: . Comprehensive medication review performed. . Reviewed Dr. [name]' note; patient was due for follow up ~01/2019. Encouraged patient to schedule follow up. He noted that he would. . Recommended addition of systemic antihistamine (Zyrtec, Xyzal) to daily fluticasone if allergies are more troublesome right now. Patient verbalized understanding.  . Praised patient for continued maintenance of near-goal weight with current diuretic regimen.   Patient Self Care Activities:  . Self administers medications as prescribed  Please see past updates related to this goal by clicking on the "Past Updates" button in the selected goal         The patient verbalized understanding of instructions provided today and declined a print copy of patient instruction materials.   Plan:  - Will outreach patient in 4-5 weeks for continued medication management support  Catie Darnelle Maffucci, PharmD, [place]  Pharmacist [facility] [phone]

## 2019-04-25 ENCOUNTER — Emergency Department: Payer: Medicare Other

## 2019-04-25 ENCOUNTER — Ambulatory Visit: Payer: Self-pay | Admitting: Pharmacist

## 2019-04-25 ENCOUNTER — Encounter: Payer: Self-pay | Admitting: Cardiovascular Disease

## 2019-04-25 ENCOUNTER — Emergency Department
Admission: EM | Admit: 2019-04-25 | Discharge: 2019-04-25 | Disposition: A | Discharge status: Home / Self Care | Payer: Medicare Other | Attending: Emergency Medicine | Admitting: Emergency Medicine

## 2019-04-25 ENCOUNTER — Other Ambulatory Visit: Payer: Self-pay

## 2019-04-25 ENCOUNTER — Encounter: Payer: Self-pay | Admitting: Emergency Medicine

## 2019-04-25 ENCOUNTER — Telehealth: Payer: Self-pay | Admitting: Internal Medicine

## 2019-04-25 DIAGNOSIS — Z87891 Personal history of nicotine dependence: Secondary | ICD-10-CM | POA: Insufficient documentation

## 2019-04-25 DIAGNOSIS — J449 Chronic obstructive pulmonary disease, unspecified: Secondary | ICD-10-CM | POA: Insufficient documentation

## 2019-04-25 DIAGNOSIS — J45909 Unspecified asthma, uncomplicated: Secondary | ICD-10-CM | POA: Insufficient documentation

## 2019-04-25 DIAGNOSIS — Z7901 Long term (current) use of anticoagulants: Secondary | ICD-10-CM | POA: Diagnosis not present

## 2019-04-25 DIAGNOSIS — I110 Hypertensive heart disease with heart failure: Secondary | ICD-10-CM | POA: Diagnosis not present

## 2019-04-25 DIAGNOSIS — R600 Localized edema: Secondary | ICD-10-CM | POA: Diagnosis not present

## 2019-04-25 DIAGNOSIS — Z79899 Other long term (current) drug therapy: Secondary | ICD-10-CM | POA: Diagnosis not present

## 2019-04-25 DIAGNOSIS — R2243 Localized swelling, mass and lump, lower limb, bilateral: Secondary | ICD-10-CM | POA: Diagnosis present

## 2019-04-25 DIAGNOSIS — I5032 Chronic diastolic (congestive) heart failure: Secondary | ICD-10-CM | POA: Diagnosis not present

## 2019-04-25 DIAGNOSIS — R05 Cough: Secondary | ICD-10-CM | POA: Diagnosis not present

## 2019-04-25 DIAGNOSIS — R0602 Shortness of breath: Secondary | ICD-10-CM | POA: Diagnosis not present

## 2019-04-25 DIAGNOSIS — M7989 Other specified soft tissue disorders: Secondary | ICD-10-CM

## 2019-04-25 DIAGNOSIS — J90 Pleural effusion, not elsewhere classified: Secondary | ICD-10-CM | POA: Diagnosis not present

## 2019-04-25 LAB — URINALYSIS, COMPLETE (UACMP) WITH MICROSCOPIC
Bacteria, UA: NONE SEEN
Bilirubin Urine: NEGATIVE
Glucose, UA: NEGATIVE mg/dL
Ketones, ur: NEGATIVE mg/dL
Leukocytes,Ua: NEGATIVE
Nitrite: NEGATIVE
Protein, ur: NEGATIVE mg/dL
Specific Gravity, Urine: 1.015 (ref 1.005–1.030)
pH: 5 (ref 5.0–8.0)

## 2019-04-25 LAB — COMPREHENSIVE METABOLIC PANEL
ALT: 14 U/L (ref 0–44)
AST: 20 U/L (ref 15–41)
Albumin: 4.2 g/dL (ref 3.5–5.0)
Alkaline Phosphatase: 65 U/L (ref 38–126)
Anion gap: 9 (ref 5–15)
BUN: 20 mg/dL (ref 8–23)
CO2: 35 mmol/L — ABNORMAL HIGH (ref 22–32)
Calcium: 9.1 mg/dL (ref 8.9–10.3)
Chloride: 99 mmol/L (ref 98–111)
Creatinine, Ser: 1.46 mg/dL — ABNORMAL HIGH (ref 0.61–1.24)
GFR calc Af Amer: 58 mL/min — ABNORMAL LOW (ref >60)
GFR calc non Af Amer: 50 mL/min — ABNORMAL LOW (ref >60)
Glucose, Bld: 88 mg/dL (ref 70–99)
Potassium: 4 mmol/L (ref 3.5–5.1)
Sodium: 143 mmol/L (ref 135–145)
Total Bilirubin: 0.8 mg/dL (ref 0.3–1.2)
Total Protein: 7.1 g/dL (ref 6.5–8.1)

## 2019-04-25 LAB — CBC
HCT: 47 % (ref 39.0–52.0)
Hemoglobin: 14.3 g/dL (ref 13.0–17.0)
MCH: 31.4 pg (ref 26.0–34.0)
MCHC: 30.4 g/dL (ref 30.0–36.0)
MCV: 103.1 fL — ABNORMAL HIGH (ref 80.0–100.0)
Platelets: 180 10*3/uL (ref 150–400)
RBC: 4.56 MIL/uL (ref 4.22–5.81)
RDW: 13.2 % (ref 11.5–15.5)
WBC: 9.5 10*3/uL (ref 4.0–10.5)
nRBC: 0 % (ref 0.0–0.2)

## 2019-04-25 LAB — BRAIN NATRIURETIC PEPTIDE: B Natriuretic Peptide: 70 pg/mL (ref 0.0–100.0)

## 2019-04-25 LAB — TROPONIN I (HIGH SENSITIVITY): Troponin I (High Sensitivity): 9 ng/L (ref <18)

## 2019-04-25 LAB — LIPASE, BLOOD: Lipase: 28 U/L (ref 11–51)
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
## 2019-04-25 NOTE — Telephone Encounter (Signed)
Received message from Chronic Care Management Pharm-D and provider concerning patient swollen , purple feet, that are really Puffy per patient.  That  Patient needed follow up and, and to be evaluated at ER. Advised patient he needs evaluation in the ER for his legs , patient agreed he will cal his brother to drive him and he will wear a mask.

## 2019-04-25 NOTE — Chronic Care Management (AMB) (Signed)
  Chronic Care Management   Note  04/25/2019 Name: Dustin [name] MRN: [id number] DOB: [birth year]  Dustin [name] is a 63 y.o. year old male who is a primary care patient of McLean-Scocuzza, Nino Glow, MD. The CCM team was consulted for assistance with chronic disease management and care coordination needs.    Received voicemail from patient today concerned about his feet/legs. Notes that they look "dark, especially the big toes", and feels like he's "seeing more of it" and remaining "puffy around the ankles". Notes his weight today is 225 lbs. Has on "loose" hospital socks. Requested a return call. I called him, informed him that I would make Dr. Terese Door aware and see her recommendations.   Catie Darnelle Maffucci, PharmD, [place] Pharmacist [facility] [place] [phone]

## 2019-04-25 NOTE — Telephone Encounter (Signed)
This encounter was created in error - please disregard.

## 2019-04-25 NOTE — Discharge Instructions (Addendum)
Return to the emergency room for shortness of breath, increased swelling, discoloration of your feet fever, or other concerns, follow closely with your primary care doctor.  Creatinine is slightly up at 1.4, please inform your doctor have them recheck it and they may consider going down on your Lasix as they have been giving you more recently.

## 2019-04-25 NOTE — ED Notes (Signed)
Signature pad in room not working, Pt verbalizes discharge teaching.

## 2019-04-25 NOTE — ED Triage Notes (Signed)
Patient presents to the ED with bilateral pedal edema and reports "purplish/blackish" tint to his feet.  Patient states he called his PCP to discuss this and was instructed to come to the ED.  Patient has a congested cough with history of copd and asthma.  Patient reports noting slightly worsened shortness of breath in the past week.  Patient denies any pain.  Patient also reports upper abdominal pain.  Denies vomiting and diarrhea.

## 2019-04-30 ENCOUNTER — Other Ambulatory Visit: Payer: Self-pay

## 2019-05-01 ENCOUNTER — Ambulatory Visit (INDEPENDENT_AMBULATORY_CARE_PROVIDER_SITE_OTHER): Payer: Medicare Other | Admitting: Internal Medicine

## 2019-05-01 ENCOUNTER — Other Ambulatory Visit: Payer: Self-pay

## 2019-05-01 ENCOUNTER — Encounter: Payer: Self-pay | Admitting: Internal Medicine

## 2019-05-01 VITALS — Ht 71.0 in | Wt 228.0 lb

## 2019-05-01 DIAGNOSIS — J432 Centrilobular emphysema: Secondary | ICD-10-CM

## 2019-05-01 DIAGNOSIS — I5032 Chronic diastolic (congestive) heart failure: Secondary | ICD-10-CM

## 2019-05-01 DIAGNOSIS — R600 Localized edema: Secondary | ICD-10-CM

## 2019-05-01 MED ORDER — FUROSEMIDE 40 MG PO TABS: ORAL_TABLET | ORAL | 3 refills | Status: DC

## 2019-05-01 NOTE — Progress Notes (Signed)
Telephone Note  I connected with Dustin [name]  on 05/01/19 at  4:00 PM EDT by a telephone and verified that I am speaking with the correct person using two identifiers.  Location patient: home Location provider:work  Persons participating in the virtual visit: patient, provider  I discussed the limitations of evaluation and management by telemedicine and the availability of in person appointments. The patient expressed understanding and agreed to proceed.   HPI: 1. ED fu went to ED 04/25/19 for ankle/foot swelling b/l and discoloration of legs looking dark CXR +mild pulmonary edema vs inflammation and chronic lung changes (I.e severe copd) and pulses intact he had h/o chronic diastolic CHF and wt is up to 228 lbs up from baseline lower 200s. He was also c/o sob. He has not been taking lasix 40 mg daily alternating with 40 mg bid due to c/w kidney function. He has been drinking total between 80-136 oz of fluid daily including water, soda and juice we discussed cutting this total intake back to 50-60 oz total per day   For leg edema compression stockings pt reports hard to get the zip up ones on    ROS: See pertinent positives and negatives per HPI.  Past Medical History:  Diagnosis Date  . Allergy   . Anxiety   . Asthma   . Chronic diastolic CHF (congestive heart failure) (HCC)    a. echo 07/2013: EF 60-65%, DD, biatrial dilatation, Ao sclerosis, dilated RV, moderate pulmonary HTN, elevated CV and RA pressures; b. patient reported echo at Dr. Milta DeitersKhan's office 02/2015 - his office does not have record of him being a pt there c. echo 11/2015: EF 60-65%, Grade 1 DD, mod-severe pulm pressures  . Chronic respiratory failure (HCC)    a. on 2L via nasal cannula; b. secondary to COPD  . COPD (chronic obstructive pulmonary disease) (HCC)    on 2L continuous   . Depression   . Emphysema of lung (HCC)   . GERD (gastroesophageal reflux disease)   . HCAP (healthcare-associated pneumonia)    01/22/18-01/28/18   . Headache   . Hypertension   . Leg DVT (deep venous thromboembolism), acute, bilateral (HCC)    02/26/18  . Personal history of tobacco use, presenting hazards to health 08/17/2015  . Pulmonary embolism (HCC)    02/26/18  . Pulmonary HTN (HCC)   . Tobacco abuse     Past Surgical History:  Procedure Laterality Date  . ABDOMINAL SURGERY    . ADENOIDECTOMY    . CARPAL TUNNEL RELEASE Right   . corpal tunnel Right   . ELBOW SURGERY Right    repaired tendon  . hemmorhoid N/A   . HEMORRHOID SURGERY    . NASAL SINUS SURGERY    . NASAL SINUS SURGERY     1970s  . NOSE SURGERY    . reflux surgery     1994  . ROTATOR CUFF REPAIR Right   . SHOULDER SURGERY    . TENNIS ELBOW RELEASE/NIRSCHEL PROCEDURE Right   . TONSILLECTOMY    . URETHRA SURGERY     surgery 6 times from age [age] yrs old  . URETHRA SURGERY      Family History  Problem Relation Age of Onset  . CAD Father   . Hyperlipidemia Father   . Stroke Father   . Heart disease Father   . Arthritis Father   . Hearing loss Father   . Hypertension Father   . Hypertension Mother   . Peripheral Artery  Disease Mother   . Rheum arthritis Mother   . Asthma Mother   . Bipolar disorder Mother   . Depression Mother   . Malignant hypertension Mother   . Arthritis Mother   . Hearing loss Mother   . Heart disease Mother   . Hyperlipidemia Mother   . Kidney disease Mother   . Birth defects Brother   . Heart disease Brother   . Alcohol abuse Daughter   . Arthritis Daughter   . Asthma Daughter   . Depression Daughter   . Drug abuse Daughter   . Miscarriages / [place] Daughter   . Intellectual disability Daughter   . Kidney disease Son   . Birth defects Paternal Grandmother   . Depression Paternal Grandmother   . Heart disease Paternal Grandmother   . Birth defects Sister   . Diabetes Neg Hx     SOCIAL HX: lives alone    Current Outpatient Medications:  .  Acetylcysteine (NAC PO), Take 1 tablet by  mouth daily., Disp: , Rfl:  .  ALPRAZolam (XANAX) 1 MG tablet, Take 1 tablet (1 mg total) by mouth 3 (three) times daily as needed for anxiety., Disp: 15 tablet, Rfl: 0 .  AMBULATORY NON FORMULARY MEDICATION, 1 each by Does not apply route as needed. Please provide patient with "So Clean" CPAP cleaning device to be used as needed., Disp: 1 Device, Rfl: 0 .  antiseptic oral rinse (BIOTENE) LIQD, 15 mLs by Mouth Rinse route daily as needed for dry mouth., Disp: 473 mL, Rfl: 12 .  apixaban (ELIQUIS) 5 MG TABS tablet, Take 1 tablet (5 mg total) by mouth 2 (two) times daily., Disp: 180 tablet, Rfl: 3 .  azithromycin (ZITHROMAX) 250 MG tablet, Take 250 mg by mouth every other day., Disp: , Rfl:  .  b complex vitamins capsule, Take 1 capsule by mouth daily., Disp: , Rfl:  .  carbamide peroxide (DEBROX) 6.5 % OTIC solution, Place 5 drops into both ears 2 (two) times daily. Prn x 4-7 days, Disp: 15 mL, Rfl: 11 .  Cholecalciferol (VITAMIN D) 2000 units tablet, Take 2,000 Units by mouth 2 (two) times a day. , Disp: , Rfl:  .  clobetasol (TEMOVATE) 0.05 % external solution, Apply 1 application topically 2 (two) times daily. Prn to scalp and ears, Disp: 50 mL, Rfl: 11 .  diltiazem (CARDIZEM) 30 MG tablet, Take 1 tablet (30 mg total) by mouth 3 (three) times daily as needed., Disp: 180 tablet, Rfl: 3 .  escitalopram (LEXAPRO) 20 MG tablet, Take 1 tablet (20 mg total) by mouth at bedtime., Disp: 90 tablet, Rfl: 3 .  fluticasone (FLONASE) 50 MCG/ACT nasal spray, Place 2 sprays into both nostrils daily as needed for allergies or rhinitis., Disp: 16 g, Rfl: 11 .  furosemide (LASIX) 40 MG tablet, 40 daily In am alternate the next day 40 mg bid (take 2nd dose in the early afternoon), Disp: 180 tablet, Rfl: 3 .  gabapentin (NEURONTIN) 300 MG capsule, TAKE 1 TO 2 CAPSULES BY MOUTH THREE TIMES DAILY, Disp: 520 capsule, Rfl: 11 .  hydrocortisone 2.5 % cream, Apply topically 2 (two) times daily as needed. face, Disp: 30 g,  Rfl: 11 .  ipratropium-albuterol (DUONEB) 0.5-2.5 (3) MG/3ML SOLN, Take 3 mLs by nebulization every 6 (six) hours as needed., Disp: 1080 mL, Rfl: 3 .  ketoconazole (NIZORAL) 2 % shampoo, Apply 1 application topically 2 (two) times a week. Let stand for 5 minutes, Disp: 120 mL, Rfl: 3 .  levofloxacin (LEVAQUIN) 500 MG tablet, Take 1 tablet (500 mg total) by mouth daily., Disp: 14 tablet, Rfl: 0 .  magnesium oxide (MAG-OX) 400 MG tablet, Take 400 mg by mouth daily., Disp: , Rfl:  .  mometasone-formoterol (DULERA) 200-5 MCG/ACT AERO, Inhale 2 puffs into the lungs 2 (two) times daily., Disp: 1 Inhaler, Rfl: 4 .  mupirocin ointment (BACTROBAN) 2 %, Apply 1 application topically 2 (two) times daily. Use left upper arm  And left ear x 1-2 weeks, Disp: 30 g, Rfl: 2 .  nystatin (MYCOSTATIN) 100000 UNIT/ML suspension, Use as directed 5 mLs (500,000 Units total) in the mouth or throat 4 (four) times daily. Swish and spit 3 or 4 times daily for at least 5 days.  Then may be used as needed thereafter for thrush, Disp: 473 mL, Rfl: 0 .  potassium chloride (K-DUR) 10 MEQ tablet, Take 1-2 tablets (10-20 mEq total) by mouth daily. Repeat if second LASIX taken, Disp: 180 tablet, Rfl: 3 .  Probiotic Product (PROBIOTIC PO), Take 1 capsule by mouth 2 (two) times a day. , Disp: , Rfl:  .  rOPINIRole (REQUIP) 0.25 MG tablet, Take 1 tablet (0.25 mg total) by mouth 2 (two) times daily., Disp: 180 tablet, Rfl: 3 .  sodium chloride (OCEAN) 0.65 % SOLN nasal spray, Place 1-2 sprays into both nostrils as needed for congestion., Disp: 480 mL, Rfl: 12 .  Spacer/Aero-Holding Chambers (OPTICHAMBER ADVANTAGE-LG MASK) MISC, Use as directed with inhaler diag  j44.1, Disp: 2 each, Rfl: 1 .  theophylline (UNIPHYL) 400 MG 24 hr tablet, Take 1 tablet (400 mg total) by mouth every morning., Disp: 90 tablet, Rfl: 3 .  Tiotropium Bromide Monohydrate (SPIRIVA RESPIMAT) 2.5 MCG/ACT AERS, Inhale 2 puffs into the lungs daily., Disp: 3 Inhaler, Rfl:  3 .  traZODone (DESYREL) 50 MG tablet, Take 1-2 tablets (50-100 mg total) by mouth at bedtime as needed for sleep., Disp: 60 tablet, Rfl: 11  EXAM:  VITALS per patient if applicable:  GENERAL: alert, oriented, appears well and in no acute distress  HEENT: atraumatic, conjunttiva clear, no obvious abnormalities on inspection of external nose and ears  LUNGS: speech normal via telephone breathing rate appears normal, no obvious gross SOB, gasping or wheezing  PSYCH/NEURO: pleasant and cooperative, no obvious depression or anxiety, speech and thought processing grossly intact  ASSESSMENT AND PLAN:  Discussed the following assessment and plan:  Chronic diastolic heart failure (HCC) - Plan:  rec lasix 40 mg qd alternate with 40 bid next day alternate with 40 qd  Reduce fluid intake from 80-136 + total oz per day to 50-60 oz per day   Leg edema - Plan: furosemide (LASIX) 40 MG tablet dosing as above  If legs do not improve and weight does not improve in 1-2 week call back and let me know Consider vascular for unna boots b/l legs   Centrilobular emphysema (HCC) - Plan: f/u pulmonary chronic lung changes noted on CXR 04/25/2019   Pt happy today he was able to attend his sons wedding recently outside and socially distanced      I discussed the assessment and treatment plan with the patient. The patient was provided an opportunity to ask questions and all were answered. The patient agreed with the plan and demonstrated an understanding of the instructions.   The patient was advised to call back or seek an in-person evaluation if the symptoms worsen or if the condition fails to improve as anticipated.  Time spent 20 minutes  Nino Glow McLean-Scocuzza, MD

## 2019-05-01 NOTE — Addendum Note (Signed)
Addended by: Orland Mustard on: 05/01/2019 06:12 PM   Modules accepted: Orders

## 2019-05-02 ENCOUNTER — Telehealth: Payer: Self-pay | Admitting: Cardiovascular Disease

## 2019-05-02 ENCOUNTER — Ambulatory Visit: Payer: Medicare Other | Admitting: Internal Medicine

## 2019-05-02 NOTE — Telephone Encounter (Signed)
I spoke with the patient to confirm his chest x-ray from 04/25/19 that we have in Epic is the once he wants Dr. Rockey Situ to review.  The patient confirms this is correct.  He is aware I will forward this message to Dr. Rockey Situ and we will call back with any recommendations.

## 2019-05-02 NOTE — Telephone Encounter (Signed)
Patient wants Dr. Rockey Situ to look at recent xray results and call with advise

## 2019-05-04 NOTE — Telephone Encounter (Signed)
Chest xray suggests some underlying lung issue which we know about, By low BNP and elevated creatinine, appears a little on the dry side

## 2019-05-05 NOTE — Telephone Encounter (Signed)
Spoke with the patient. Adv him that Dr.Gollan had reviewed his recent cxr and had ot made any changes at this time. Patient sts that his existing sob is stable. Patient does weight daily and sts that his weight had been trending down. Adv the pt to continue his daily weights and to contact the office if his weight is up 3lbs in a day 5lbs in a week. Patient verbalized understanding and voiced appreciation for the call.

## 2019-05-05 NOTE — Telephone Encounter (Signed)
This is non pre-op questions. Will remove from pool and forward to Triage.

## 2019-05-05 NOTE — Telephone Encounter (Signed)
Patient calling to check on status  Would like to know Dr Donivan Scull recommendations

## 2019-05-06 ENCOUNTER — Telehealth: Payer: Self-pay | Admitting: *Deleted

## 2019-05-06 NOTE — Telephone Encounter (Signed)
-----   Message from Minna Merritts, MD sent at 05/04/2019  3:48 PM EDT ----- Chest xray suggests some underlying lung issue which we know about, By low BNP and elevated creatinine, appears a little on the dry side

## 2019-05-06 NOTE — Telephone Encounter (Signed)
Reviewed providers information with him and advised to try and not get overheated. He verbalized understanding and had no further questions at this time.

## 2019-05-08 ENCOUNTER — Telehealth: Payer: Self-pay | Admitting: Cardiovascular Disease

## 2019-05-08 NOTE — Telephone Encounter (Signed)
Levaquin is an antibiotic and non-cardiac medication. Will route to [facility] for approval and to advise if a long-term prescription is needed.

## 2019-05-08 NOTE — Telephone Encounter (Signed)
Patient calling in to see if he should continue taking Levaquin. Patient has 2 weeks prescribed and is calling back to see if Dr. Rockey Situ would like for patient to continue medication. If so, refills will need to be sent into [facility] mail order. Please advise

## 2019-05-08 NOTE — Telephone Encounter (Signed)
No refills needed of his White he did take helped is bronchitis/pneumonia resolve

## 2019-05-12 ENCOUNTER — Ambulatory Visit: Payer: Self-pay | Admitting: Pharmacist

## 2019-05-12 DIAGNOSIS — J449 Chronic obstructive pulmonary disease, unspecified: Secondary | ICD-10-CM

## 2019-05-12 NOTE — Chronic Care Management (AMB) (Signed)
Chronic Care Management   Follow Up Note   05/12/2019 Name: Dustin [name] MRN: [id number] DOB: July 25, 1956  Referred by: McLean-Scocuzza, Nino Glow, MD Reason for referral : Chronic Care Management (Medication Management)   Dustin [name] is a 63 y.o. year old male who is a primary care patient of McLean-Scocuzza, Nino Glow, MD. The CCM team was consulted for assistance with chronic disease management and care coordination needs.    Received call from patient today regarding medication access concerns.   Review of patient status, including review of consultants reports, relevant laboratory and other test results, and collaboration with appropriate care team members and the patient's provider was performed as part of comprehensive patient evaluation and provision of chronic care management services.    SDOH (Social Determinants of Health) screening performed today: Financial Strain . See Care Plan for related entries.   Outpatient Encounter Medications as of 05/12/2019  Medication Sig Note  . Acetylcysteine (NAC PO) Take 1 tablet by mouth daily.   Marland Kitchen ALPRAZolam (XANAX) 1 MG tablet Take 1 tablet (1 mg total) by mouth 3 (three) times daily as needed for anxiety.   . AMBULATORY NON FORMULARY MEDICATION 1 each by Does not apply route as needed. Please provide patient with "So Clean" CPAP cleaning device to be used as needed.   Marland Kitchen antiseptic oral rinse (BIOTENE) LIQD 15 mLs by Mouth Rinse route daily as needed for dry mouth.   Marland Kitchen apixaban (ELIQUIS) 5 MG TABS tablet Take 1 tablet (5 mg total) by mouth 2 (two) times daily.   Marland Kitchen azithromycin (ZITHROMAX) 250 MG tablet Take 250 mg by mouth every other day.   . b complex vitamins capsule Take 1 capsule by mouth daily.   . carbamide peroxide (DEBROX) 6.5 % OTIC solution Place 5 drops into both ears 2 (two) times daily. Prn x 4-7 days   . Cholecalciferol (VITAMIN D) 2000 units tablet Take 2,000 Units by mouth 2 (two) times a day.    . clobetasol  (TEMOVATE) 0.05 % external solution Apply 1 application topically 2 (two) times daily. Prn to scalp and ears   . diltiazem (CARDIZEM) 30 MG tablet Take 1 tablet (30 mg total) by mouth 3 (three) times daily as needed.   Marland Kitchen escitalopram (LEXAPRO) 20 MG tablet Take 1 tablet (20 mg total) by mouth at bedtime.   . fluticasone (FLONASE) 50 MCG/ACT nasal spray Place 2 sprays into both nostrils daily as needed for allergies or rhinitis.   . furosemide (LASIX) 40 MG tablet 40 daily In am alternate the next day 40 mg bid (take 2nd dose in the early afternoon)   . gabapentin (NEURONTIN) 300 MG capsule TAKE 1 TO 2 CAPSULES BY MOUTH THREE TIMES DAILY   . hydrocortisone 2.5 % cream Apply topically 2 (two) times daily as needed. face   . ipratropium-albuterol (DUONEB) 0.5-2.5 (3) MG/3ML SOLN Take 3 mLs by nebulization every 6 (six) hours as needed.   Marland Kitchen ketoconazole (NIZORAL) 2 % shampoo Apply 1 application topically 2 (two) times a week. Let stand for 5 minutes   . levofloxacin (LEVAQUIN) 500 MG tablet Take 1 tablet (500 mg total) by mouth daily.   . magnesium oxide (MAG-OX) 400 MG tablet Take 400 mg by mouth daily.   . mometasone-formoterol (DULERA) 200-5 MCG/ACT AERO Inhale 2 puffs into the lungs 2 (two) times daily.   . mupirocin ointment (BACTROBAN) 2 % Apply 1 application topically 2 (two) times daily. Use left upper arm  And left ear  x 1-2 weeks   . nystatin (MYCOSTATIN) 100000 UNIT/ML suspension Use as directed 5 mLs (500,000 Units total) in the mouth or throat 4 (four) times daily. Swish and spit 3 or 4 times daily for at least 5 days.  Then may be used as needed thereafter for thrush   . potassium chloride (K-DUR) 10 MEQ tablet Take 1-2 tablets (10-20 mEq total) by mouth daily. Repeat if second LASIX taken   . Probiotic Product (PROBIOTIC PO) Take 1 capsule by mouth 2 (two) times a day.    Marland Kitchen. rOPINIRole (REQUIP) 0.25 MG tablet Take 1 tablet (0.25 mg total) by mouth 2 (two) times daily.   . sodium chloride  (OCEAN) 0.65 % SOLN nasal spray Place 1-2 sprays into both nostrils as needed for congestion.   Marland Kitchen. Spacer/Aero-Holding Chambers (OPTICHAMBER ADVANTAGE-LG MASK) MISC Use as directed with inhaler diag  j44.1   . theophylline (UNIPHYL) 400 MG 24 hr tablet Take 1 tablet (400 mg total) by mouth every morning.   . Tiotropium Bromide Monohydrate (SPIRIVA RESPIMAT) 2.5 MCG/ACT AERS Inhale 2 puffs into the lungs daily.   . traZODone (DESYREL) 50 MG tablet Take 1-2 tablets (50-100 mg total) by mouth at bedtime as needed for sleep. 04/10/2019: PRN   No facility-administered encounter medications on file as of 05/12/2019.      Goals Addressed            This Visit's Progress     Patient Stated   . "I want to stay well" (pt-stated)       Current Barriers:  . Polypharmacy - patient with COPD, CHF, Afib, anxiety . Financial concerns- resolved; patient is receiving Dulera ([employer]ck), Spiriva ([facility]), and Eliquis (B[employer]) patient assistance through 09/18/2019. o Contacted me today concerned about his Spiriva refills. He notes [facility] Spir[facility] stating that he would only get 4 inhalers, then cannot receive any more fills. I suspect this letter was from his initial temporary supply sent in January, but he states this was from his supply received 02/2019.  o Notes he has ~2 inhalers left, so is not about to run out. . COPD; follows with Dr. Sung AmabileSimonds, though has not been seen since 10/2018, managed on Dulera + Spiriva, azithromycin 250 QOD, theophylline 400 mg QAM, Duonebs PRN. Notes that he will need a refill of azithromycin soon . HFrEF, follows with Dr. Mariah MillingGollan  Pharmacist Clinical Goal(s):  Marland Kitchen. Over the next 90 days, patient will work with PharmD, primary care provider, and specialty providers to address needs related to optimized medication management  Interventions: . Will collaborate with Lilla ShookAshley Coleman, CPhT to follow up with [facility] over the next few weeks to see if there will be  any concerns when patient is due for a Spiriva refill.  Patient Self Care Activities:  . Self administers medications as prescribed  Please see past updates related to this goal by clicking on the "Past Updates" button in the selected goal         Plan:  - Will collaborate with Lilla ShookAshley Coleman, CPhT to contact [employer] [facility].   Catie Feliz Beamravis, PharmD, CPP Clinical Pharmacist [facility] [facility] [name] [phone]

## 2019-05-12 NOTE — Patient Instructions (Signed)
Visit Information  Goals Addressed            This Visit's Progress     Patient Stated   . "I want to stay well" (pt-stated)       Current Barriers:  . Polypharmacy - patient with COPD, CHF, Afib, anxiety . Financial concerns- resolved; patient is receiving Dulera (Merck), Spiriva ([facility]), and Eliquis (BMS) patient assistance through 09/18/2019. o Contacted me today concerned about his Spiriva refills. He notes he received a letter from [facility] in the package with Spiriva stating that he would only get 4 inhalers, then cannot receive any more fills. I suspect this letter was from his initial temporary supply sent in January, but he states this was from his supply received 02/2019.  o Notes he has ~2 inhalers left, so is not about to run out. . COPD; follows with Dr. [name], though has not been seen since 10/2018, managed on Dulera + Spiriva, azithromycin 250 QOD, theophylline 400 mg QAM, Duonebs PRN. Notes that he will need a refill of azithromycin soon . HFrEF, follows with Dr. Rockey Situ  Pharmacist Clinical Goal(s):  Marland Kitchen Over the next 90 days, patient will work with PharmD, primary care provider, and specialty providers to address needs related to optimized medication management  Interventions: . Will collaborate with [name], CPhT to follow up with [facility] over the next few weeks to see if there will be any concerns when patient is due for a Spiriva refill.  Patient Self Care Activities:  . Self administers medications as prescribed  Please see past updates related to this goal by clicking on the "Past Updates" button in the selected goal         The patient verbalized understanding of instructions provided today and declined a print copy of patient instruction materials.   Plan:  - Will collaborate with [name], CPhT to contact [place].   Catie Darnelle Maffucci, PharmD, [place] Pharmacist [facility] [place] [phone]

## 2019-05-12 NOTE — Telephone Encounter (Signed)
Spoke with patient and reviewed that provider said no refills at this time and hoped that this medication had helped. He verbalized understanding with no further questions at this time.

## 2019-05-20 DIAGNOSIS — J471 Bronchiectasis with (acute) exacerbation: Secondary | ICD-10-CM | POA: Diagnosis not present

## 2019-05-20 DIAGNOSIS — J449 Chronic obstructive pulmonary disease, unspecified: Secondary | ICD-10-CM | POA: Diagnosis not present

## 2019-05-22 ENCOUNTER — Telehealth: Payer: Self-pay | Admitting: Pulmonary Disease

## 2019-05-22 ENCOUNTER — Ambulatory Visit: Payer: Self-pay | Admitting: Pharmacist

## 2019-05-22 DIAGNOSIS — J449 Chronic obstructive pulmonary disease, unspecified: Secondary | ICD-10-CM

## 2019-05-22 DIAGNOSIS — J432 Centrilobular emphysema: Secondary | ICD-10-CM

## 2019-05-22 NOTE — Telephone Encounter (Signed)
ATC pt- unable to leave voicemail due to mailbox not being setup.  *if patient returns call we can schedule phone visit with DS if pt would like.

## 2019-05-22 NOTE — Telephone Encounter (Signed)
Pt called stating that he was in the ED 2 weeks ago due to retaining fluid - Dr. Lenon Ahmadi that it may be an early sign of bacteria. Pt has been coughing up phlegm for the past week that is green in color and has a taste of possible abscess.  Pt states he had CXR and is still retaining some fluid.- would Dr. [name] like to see him or does he feel he can call in something for him?

## 2019-05-22 NOTE — Patient Instructions (Signed)
Visit Information  Goals Addressed            This Visit's Progress     Patient Stated   . "I want to stay well" (pt-stated)       Current Barriers:  . Polypharmacy - patient with COPD, CHF, Afib, anxiety . Financial concerns- resolved; patient is receiving Dulera (Merck), Spiriva ([facility]), and Eliquis (BMS) patient assistance through 09/18/2019. o Contacted me to follow up on Spiriva supply . COPD; follows with Dr. [name], though has not been seen since 10/2018, managed on Dulera + Spiriva, azithromycin 250 QOD, theophylline 400 mg QAM, Duonebs PRN.  Marland Kitchen HFrEF, follows with Dr. Rockey Situ  Pharmacist Clinical Goal(s):  Marland Kitchen Over the next 90 days, patient will work with PharmD, primary care provider, and specialty providers to address needs related to optimized medication management  Interventions: . Informed patient that he is approved for Spiriva through the end of the year, and to contact [place] for refills when he needs them. He verbalized understanding  Patient Self Care Activities:  . Self administers medications as prescribed  Please see past updates related to this goal by clicking on the "Past Updates" button in the selected goal         The patient verbalized understanding of instructions provided today and declined a print copy of patient instruction materials.   Plan:  - Will outreach patient in 2-4 weeks for continued medication management support  Catie Darnelle Maffucci, PharmD, [place] Pharmacist [facility] [place] [phone]

## 2019-05-22 NOTE — Chronic Care Management (AMB) (Signed)
Chronic Care Management   Follow Up Note   05/22/2019 Name: Dustin [name] MRN: [id number] DOB: March 27, 1956  Referred by: McLean-Scocuzza, Nino Glow, MD Reason for referral : Chronic Care Management (Medication Management)   Dustin [name] is a 63 y.o. year old male who is a primary care patient of McLean-Scocuzza, Nino Glow, MD. The CCM team was consulted for assistance with chronic disease management and care coordination needs.    Received call from patient today with concerns regarding Spiriva supply.  Review of patient status, including review of consultants reports, relevant laboratory and other test results, and collaboration with appropriate care team members and the patient's provider was performed as part of comprehensive patient evaluation and provision of chronic care management services.    SDOH (Social Determinants of Health) screening performed today: Financial Strain . See Care Plan for related entries.   Outpatient Encounter Medications as of 05/22/2019  Medication Sig Note  . Acetylcysteine (NAC PO) Take 1 tablet by mouth daily.   Marland Kitchen ALPRAZolam (XANAX) 1 MG tablet Take 1 tablet (1 mg total) by mouth 3 (three) times daily as needed for anxiety.   . AMBULATORY NON FORMULARY MEDICATION 1 each by Does not apply route as needed. Please provide patient with "So Clean" CPAP cleaning device to be used as needed.   Marland Kitchen antiseptic oral rinse (BIOTENE) LIQD 15 mLs by Mouth Rinse route daily as needed for dry mouth.   Marland Kitchen apixaban (ELIQUIS) 5 MG TABS tablet Take 1 tablet (5 mg total) by mouth 2 (two) times daily.   Marland Kitchen azithromycin (ZITHROMAX) 250 MG tablet Take 250 mg by mouth every other day.   . b complex vitamins capsule Take 1 capsule by mouth daily.   . carbamide peroxide (DEBROX) 6.5 % OTIC solution Place 5 drops into both ears 2 (two) times daily. Prn x 4-7 days   . Cholecalciferol (VITAMIN D) 2000 units tablet Take 2,000 Units by mouth 2 (two) times a day.    . clobetasol (TEMOVATE)  0.05 % external solution Apply 1 application topically 2 (two) times daily. Prn to scalp and ears   . diltiazem (CARDIZEM) 30 MG tablet Take 1 tablet (30 mg total) by mouth 3 (three) times daily as needed.   Marland Kitchen escitalopram (LEXAPRO) 20 MG tablet Take 1 tablet (20 mg total) by mouth at bedtime.   . fluticasone (FLONASE) 50 MCG/ACT nasal spray Place 2 sprays into both nostrils daily as needed for allergies or rhinitis.   . furosemide (LASIX) 40 MG tablet 40 daily In am alternate the next day 40 mg bid (take 2nd dose in the early afternoon)   . gabapentin (NEURONTIN) 300 MG capsule TAKE 1 TO 2 CAPSULES BY MOUTH THREE TIMES DAILY   . hydrocortisone 2.5 % cream Apply topically 2 (two) times daily as needed. face   . ipratropium-albuterol (DUONEB) 0.5-2.5 (3) MG/3ML SOLN Take 3 mLs by nebulization every 6 (six) hours as needed.   Marland Kitchen ketoconazole (NIZORAL) 2 % shampoo Apply 1 application topically 2 (two) times a week. Let stand for 5 minutes   . levofloxacin (LEVAQUIN) 500 MG tablet Take 1 tablet (500 mg total) by mouth daily.   . magnesium oxide (MAG-OX) 400 MG tablet Take 400 mg by mouth daily.   . mometasone-formoterol (DULERA) 200-5 MCG/ACT AERO Inhale 2 puffs into the lungs 2 (two) times daily.   . mupirocin ointment (BACTROBAN) 2 % Apply 1 application topically 2 (two) times daily. Use left upper arm  And left ear  x 1-2 weeks   . nystatin (MYCOSTATIN) 100000 UNIT/ML suspension Use as directed 5 mLs (500,000 Units total) in the mouth or throat 4 (four) times daily. Swish and spit 3 or 4 times daily for at least 5 days.  Then may be used as needed thereafter for thrush   . potassium chloride (K-DUR) 10 MEQ tablet Take 1-2 tablets (10-20 mEq total) by mouth daily. Repeat if second LASIX taken   . Probiotic Product (PROBIOTIC PO) Take 1 capsule by mouth 2 (two) times a day.    Marland Kitchen. rOPINIRole (REQUIP) 0.25 MG tablet Take 1 tablet (0.25 mg total) by mouth 2 (two) times daily.   . sodium chloride (OCEAN) 0.65  % SOLN nasal spray Place 1-2 sprays into both nostrils as needed for congestion.   Marland Kitchen. Spacer/Aero-Holding Chambers (OPTICHAMBER ADVANTAGE-LG MASK) MISC Use as directed with inhaler diag  j44.1   . theophylline (UNIPHYL) 400 MG 24 hr tablet Take 1 tablet (400 mg total) by mouth every morning.   . Tiotropium Bromide Monohydrate (SPIRIVA RESPIMAT) 2.5 MCG/ACT AERS Inhale 2 puffs into the lungs daily.   . traZODone (DESYREL) 50 MG tablet Take 1-2 tablets (50-100 mg total) by mouth at bedtime as needed for sleep. 04/10/2019: PRN   No facility-administered encounter medications on file as of 05/22/2019.      Goals Addressed            This Visit's Progress     Patient Stated   . "I want to stay well" (pt-stated)       Current Barriers:  . Polypharmacy - patient with COPD, CHF, Afib, anxiety . Financial concerns- resolved; patient is receiving Dulera ([employer]ck), Spiriva (BI), and Eliquis (BMS) patient assistance through 09/18/2019. o Contacted me to follow up on Spiriva supply . COPD; follows with Dr. Sung AmabileSimonds, though has not been seen since 10/2018, managed on Dulera + Spiriva, azithromycin 250 QOD, theophylline 400 mg QAM, Duonebs PRN.  Marland Kitchen. HFrEF, follows with Dr. Mariah MillingGollan  Pharmacist Clinical Goal(s):  Marland Kitchen. Over the next 90 days, patient will work with PharmD, primary care provider, and specialty providers to address needs related to optimized medication management  Interventions: . Informed patient that he is approved for Spiriva through the end of the year, and to contact [employer] for refills when he needs them. He verbalized understanding  Patient Self Care Activities:  . Self administers medications as prescribed  Please see past updates related to this goal by clicking on the "Past Updates" button in the selected goal          Plan:  - Will outreach patient in 2-4 weeks for continued medication management support  Catie Feliz Beamravis, PharmD, CPP Clinical Pharmacist [facility]  [name] [phone]

## 2019-05-23 ENCOUNTER — Other Ambulatory Visit (INDEPENDENT_AMBULATORY_CARE_PROVIDER_SITE_OTHER): Payer: Medicare Other

## 2019-05-23 ENCOUNTER — Ambulatory Visit (INDEPENDENT_AMBULATORY_CARE_PROVIDER_SITE_OTHER): Payer: Medicare Other | Admitting: Pulmonary Disease

## 2019-05-23 ENCOUNTER — Encounter: Payer: Self-pay | Admitting: Pulmonary Disease

## 2019-05-23 ENCOUNTER — Other Ambulatory Visit: Payer: Self-pay

## 2019-05-23 DIAGNOSIS — J449 Chronic obstructive pulmonary disease, unspecified: Secondary | ICD-10-CM

## 2019-05-23 DIAGNOSIS — Z1389 Encounter for screening for other disorder: Secondary | ICD-10-CM

## 2019-05-23 DIAGNOSIS — Z1329 Encounter for screening for other suspected endocrine disorder: Secondary | ICD-10-CM

## 2019-05-23 DIAGNOSIS — Z86711 Personal history of pulmonary embolism: Secondary | ICD-10-CM

## 2019-05-23 DIAGNOSIS — Z125 Encounter for screening for malignant neoplasm of prostate: Secondary | ICD-10-CM

## 2019-05-23 DIAGNOSIS — E559 Vitamin D deficiency, unspecified: Secondary | ICD-10-CM | POA: Diagnosis not present

## 2019-05-23 DIAGNOSIS — Z13220 Encounter for screening for lipoid disorders: Secondary | ICD-10-CM

## 2019-05-23 DIAGNOSIS — J411 Mucopurulent chronic bronchitis: Secondary | ICD-10-CM

## 2019-05-23 DIAGNOSIS — J9611 Chronic respiratory failure with hypoxia: Secondary | ICD-10-CM

## 2019-05-23 DIAGNOSIS — R7989 Other specified abnormal findings of blood chemistry: Secondary | ICD-10-CM | POA: Diagnosis not present

## 2019-05-23 DIAGNOSIS — F17200 Nicotine dependence, unspecified, uncomplicated: Secondary | ICD-10-CM

## 2019-05-23 DIAGNOSIS — R319 Hematuria, unspecified: Secondary | ICD-10-CM | POA: Diagnosis not present

## 2019-05-23 DIAGNOSIS — I10 Essential (primary) hypertension: Secondary | ICD-10-CM | POA: Diagnosis not present

## 2019-05-23 LAB — CBC WITH DIFFERENTIAL/PLATELET
Basophils Absolute: 0.1 10*3/uL (ref 0.0–0.1)
Basophils Relative: 0.9 % (ref 0.0–3.0)
Eosinophils Absolute: 0.2 10*3/uL (ref 0.0–0.7)
Eosinophils Relative: 2.8 % (ref 0.0–5.0)
HCT: 42 % (ref 39.0–52.0)
Hemoglobin: 13.5 g/dL (ref 13.0–17.0)
Lymphocytes Relative: 23.1 % (ref 12.0–46.0)
Lymphs Abs: 1.4 10*3/uL (ref 0.7–4.0)
MCHC: 32.1 g/dL (ref 30.0–36.0)
MCV: 99.6 fl (ref 78.0–100.0)
Monocytes Absolute: 0.4 10*3/uL (ref 0.1–1.0)
Monocytes Relative: 7.2 % (ref 3.0–12.0)
Neutro Abs: 4.1 10*3/uL (ref 1.4–7.7)
Neutrophils Relative %: 66 % (ref 43.0–77.0)
Platelets: 173 10*3/uL (ref 150.0–400.0)
RBC: 4.21 Mil/uL — ABNORMAL LOW (ref 4.22–5.81)
RDW: 14.3 % (ref 11.5–15.5)
WBC: 6.2 10*3/uL (ref 4.0–10.5)

## 2019-05-23 LAB — LIPID PANEL
Cholesterol: 121 mg/dL (ref 0–200)
HDL: 48.2 mg/dL (ref >39.00)
LDL Cholesterol: 62 mg/dL (ref 0–99)
NonHDL: 72.97
Total CHOL/HDL Ratio: 3
Triglycerides: 55 mg/dL (ref 0.0–149.0)
VLDL: 11 mg/dL (ref 0.0–40.0)

## 2019-05-23 LAB — BASIC METABOLIC PANEL
BUN: 13 mg/dL (ref 6–23)
CO2: 36 mEq/L — ABNORMAL HIGH (ref 19–32)
Calcium: 9.3 mg/dL (ref 8.4–10.5)
Chloride: 103 mEq/L (ref 96–112)
Creatinine, Ser: 1.08 mg/dL (ref 0.40–1.50)
GFR: 68.57 mL/min (ref >60.00)
Glucose, Bld: 85 mg/dL (ref 70–99)
Potassium: 4.1 mEq/L (ref 3.5–5.1)
Sodium: 145 mEq/L (ref 135–145)

## 2019-05-23 LAB — VITAMIN D 25 HYDROXY (VIT D DEFICIENCY, FRACTURES): VITD: 51.04 ng/mL (ref 30.00–100.00)

## 2019-05-23 LAB — TSH: TSH: 2.32 u[IU]/mL (ref 0.35–4.50)

## 2019-05-23 LAB — PSA, MEDICARE: PSA: 0.39 ng/ml (ref 0.10–4.00)

## 2019-05-23 MED ORDER — AMOXICILLIN-POT CLAVULANATE 875-125 MG PO TABS: 1.0000 | ORAL_TABLET | Freq: Two times a day (BID) | ORAL | 0 refills | Status: DC

## 2019-05-23 NOTE — Telephone Encounter (Signed)
Pt has been scheduled for phone visit on 05/23/2019.

## 2019-05-23 NOTE — Patient Instructions (Addendum)
Continue Dulera and Spiriva inhalers as previously Continue DuoNeb (nebulized medication) as needed up to 4 times per day Continue theophylline (Uniphyl) 400 mg daily Continue oxygen therapy as close to 24 hours/day as possible Strongly encourage that you quit smoking altogether  New prescription: Augmentin 875-125 mg, 1 pill twice a day for 5 days. Hold azithromycin while on Augmentin.  Resume after Augmentin course completed.  Follow-up 3-4 months with Dr. Patsey Berthold.  Call sooner if needed

## 2019-05-23 NOTE — Progress Notes (Signed)
PULMONARY OFFICE FOLLOW-UP NOTE  Requesting MD/Service: Self Date of initial consultation: 12/18/17 Reason for consultation: severe COPD, recent intubation for AECOPD  PT PROFILE: 63 y.o. male smoker (up to 2 PPD previously X 40+ yrs, now occasional, none since recent hospitalization) hospitalized 03/07-03/14/19 with ventilator dependent respiratory failure due to AECOPD, PNA. Respiratory virus panel was negative for influenza and positive for rhinovirus. Intubated 03/07 and extubated 03/10. Has prior history of O2 dependent COPD (baseline 2 LPM [name]).   DATA/EVENTS: 06/11/17 CT chest: very severe emphysema. Nonspecific RUL irregular opacity. 9 mm right lower lobe nodule is stable dating back to 11/30/2015 11/23/17 Echocardiogram: LVEF 95-62%, grade I diastolic dysfunction. Mild RV hypokinesis of the apex, RVSP est 45 mmHg 01/22/18 CT chest: Very severe emphysema. New right lower lobe pneumonia. Probable left lower lobe airspace disease is well.  02/26/18 ED visit: CC of LE edema. CTA chest revealed possible PE. LE venous US revealed B DVT 02/26/18 LE venous US: Deep venous thrombosis noted in the right popliteal vein, right posterior tibial vein and left gastroc veins 02/26/18 CTA chest: Findings suspicious for subsegmental pulmonary emboli in the lingula and left lower lobe, motion artifact also considered but felt less likely. Advanced emphysema. Progression in consolidative and nodular opacities in both lower lobes since exam last month. This may be infectious or inflammatory. Given presence of debris in the bronchi, aspiration is considered. Given the nodular appearance, neoplastic process is not excluded, recommend follow-up chest CT in 3 months 05/22/18 CT/PET: Very severe emphysema.  No suspicious hypermetabolic activity within the neck, chest, abdomen or pelvis. The right lower lobe pulmonary nodule is stable, without hypermetabolic activity, consistent with a benign find  Virtual Visit via  Telephone Note I connected with Dustin [name] on 05/23/19 at 10:00 AM EDT by telephone and verified that I am speaking with the correct person using two identifiers. I discussed the limitations, risks, security and privacy concerns of performing an evaluation and management service by telephone and the availability of in person appointments. I also discussed with the patient that there may be a patient responsible charge related to this service. The patient expressed understanding and agreed to proceed.   INTERVAL: Last office visit with me 11/15/18.  No major pulmonary events. Seen in ED 08/07 with increased LE edema.   SUBJ:  This is a scheduled follow-up visit which was performed remotely due to the coronavirus pandemic. Reports one week of green mucus with foul taste and odor. Still reports LE edema - improved since last month. Denies fever, CP, hemoptysis. Continues to have SOB with minimal exertion. Still smokes 1-2 cigs per day.     There were no vitals filed for this visit.  EXAM:  Due to the remote nature of this encounter, no physical examination could be performed  DATA:   BMP Latest Ref Rng & Units 04/25/2019 04/01/2019 12/06/2018  Glucose 70 - 99 mg/dL 88 81 92  BUN 8 - 23 mg/dL 20 11 20   Creatinine 0.61 - 1.24 mg/dL 1.46(H) 1.11 1.32  BUN/Creat Ratio 10 - 24 - 10 -  Sodium 135 - 145 mmol/L 143 145(H) 146(H)  Potassium 3.5 - 5.1 mmol/L 4.0 4.5 5.1  Chloride 98 - 111 mmol/L 99 96 102  CO2 22 - 32 mmol/L 35(H) 33(H) 39(H)  Calcium 8.9 - 10.3 mg/dL 9.1 9.5 9.6    CBC Latest Ref Rng & Units 04/25/2019 02/26/2018 01/25/2018  WBC 4.0 - 10.5 K/uL 9.5 8.6 14.3(H)  Hemoglobin 13.0 - 17.0  g/dL 45.414.3 12.6(L) 14.2  Hematocrit 39.0 - 52.0 % 47.0 37.4(L) 43.0  Platelets 150 - 400 K/uL 180 241 154    CXR 08/07: Borderline cardiomegaly, NSC diffuse interstitial prominence  I have personally reviewed all chest radiographs reported above including CXRs and CT chest unless otherwise  indicated  IMPRESSION:     ICD-10-CM   1. Chronic respiratory failure with hypoxia (HCC)  J96.11   2. Very severe COPD  J44.9   3. Purulent bronchitis (HCC)  J41.1   4. Smoker  F17.200   5. History of pulmonary embolism  Z86.711     PLAN:  Continue Dulera and Spiriva inhalers as previously Continue DuoNeb (nebulized medication) as needed up to 4 times per day Continue theophylline (Uniphyl) 400 mg daily Continue oxygen therapy as close to 24 hours/day as possible I strongly encouraged that he quit smoking altogether  New prescription: Augmentin 875-125 mg, 1 pill twice a day for 5 days. Hold azithromycin while on Augmentin.  Resume every other day azithromycin after Augmentin course completed.   Follow-up 3-4 months with Dr. Jayme CloudGonzalez.  Call sooner if needed  18 mins spent on this encounter  Billy Fischeravid Ulysses Alper, MD PCCM service Mobile [phone] Pager [phone] 05/23/2019 10:11 AM

## 2019-05-25 LAB — URINALYSIS, ROUTINE W REFLEX MICROSCOPIC
Bilirubin Urine: NEGATIVE
Glucose, UA: NEGATIVE
Hgb urine dipstick: NEGATIVE
Ketones, ur: NEGATIVE
Leukocytes,Ua: NEGATIVE
Nitrite: NEGATIVE
Protein, ur: NEGATIVE
Specific Gravity, Urine: 1.023 (ref 1.001–1.03)
pH: <=5 (ref 5.0–8.0)

## 2019-05-25 LAB — URINE CULTURE
MICRO NUMBER:: 849207
SPECIMEN QUALITY:: ADEQUATE

## 2019-06-09 ENCOUNTER — Ambulatory Visit (INDEPENDENT_AMBULATORY_CARE_PROVIDER_SITE_OTHER): Payer: Medicare Other | Admitting: Pharmacist

## 2019-06-09 DIAGNOSIS — J449 Chronic obstructive pulmonary disease, unspecified: Secondary | ICD-10-CM

## 2019-06-09 DIAGNOSIS — I5032 Chronic diastolic (congestive) heart failure: Secondary | ICD-10-CM

## 2019-06-09 NOTE — Patient Instructions (Addendum)
Mr. Seiden,   It was great talking to you today!  I strongly recommend you call the [place] Quitline (1-800-QUIT-NOW; 647-757-7644). They should be able to provide you nicotine patches and/or gum to help you quit smoking and stay tobacco free.   I've placed an order for a [place] and Education officer, museum to give you a call, just to touch base and see if they can help with your oxygen machine and transportation concerns.   As always, feel free to call me with any concerns!  Catie Darnelle Maffucci, PharmD  [phone]  Visit Information  Goals Addressed            This Visit's Progress     Patient Stated   . "I need to quit smoking" (pt-stated)       Current Barriers:  . Tobacco abuse of ~ 40 years; currently smoking 1-2 cigarettes daily  . Previous quit attempts, successful going cold [place]; however, notes he would be interested in using pharmacotherapy  . Reports triggers to smoke include: stress, things that get you down  . Reports motivation to quit smoking includes: his granddaughter (3 yo)  . On a scale of 1-10, reports MOTIVATION to quit is at least an 8 o Hobbies: used to play ball and did bowling; however, knows he can't do this right now . Notes that he is having a difficult time with transportation and his oxygen machine right now. He reports the battery doesn't last very long, which limits his ability to go places and run his own errands. He also notes that his apartment complex won't give him a parking spot near his apartment, which also limits his ability to walk to his car   Pharmacist Clinical Goal(s):  Marland Kitchen Over the next 90 days, patient will work with PharmD and provider towards tobacco cessation  Interventions: . Comprehensive medication review performed, medication list in electronic medical record updated . Recommend NRT gum. Provided contact information for [place] [facility]uit Line (1-800-QUIT-NOW). Will mail this phone number to patient for his review. Patient will  outreach this group for support. . Discussed referral to [facility] RN Cm/possibly SW for oxygen machine support and possibly transportation/housing support. He is amenable to this. Order placed.  . Patient confirmed knowing he has f/u w/ Dr. Terese Door this Friday.   Patient Self Care Activities:  . Patient will commit to reducing tobacco consumption . Patient will contact 1-800-QUIT-NOW line  Initial goal documentation        Print copy of patient instructions provided.    Plan: - Will outreach patient in the next 4-5 weeks for continued medication management support  Catie Darnelle Maffucci, PharmD, [place] Pharmacist [place] [phone]

## 2019-06-09 NOTE — Chronic Care Management (AMB) (Signed)
Chronic Care Management   Follow Up Note   06/09/2019 Name: Dustin [name] MRN: [id number] DOB: Nov 05, 1955  Referred by: McLean-Scocuzza, Pasty Spillersracy N, MD Reason for referral : Chronic Care Management (Medication Management)   Dustin [name] is a 63 y.o. year old male who is a primary care patient of McLean-Scocuzza, Pasty Spillersracy N, MD. The CCM team was consulted for assistance with chronic disease management and care coordination needs.    Contacted patient for medication management follow up today.   Review of patient status, including review of consultants reports, relevant laboratory and other test results, and collaboration with appropriate care team members and the patient's provider was performed as part of comprehensive patient evaluation and provision of chronic care management services.    SDOH (Social Determinants of Health) screening performed today: Housing  Transportation Tobacco Use. See Care Plan for related entries.   Advanced Directives Status: N See Care Plan and [place] application for related entries.  Outpatient Encounter Medications as of 06/09/2019  Medication Sig Note  . Acetylcysteine (NAC PO) Take 1 tablet by mouth daily.   Marland Kitchen. ALPRAZolam (XANAX) 1 MG tablet Take 1 tablet (1 mg total) by mouth 3 (three) times daily as needed for anxiety.   . AMBULATORY NON FORMULARY MEDICATION 1 each by Does not apply route as needed. Please provide patient with "So Clean" CPAP cleaning device to be used as needed.   Marland Kitchen. amoxicillin-clavulanate (AUGMENTIN) 875-125 MG tablet Take 1 tablet by mouth 2 (two) times daily.   Marland Kitchen. antiseptic oral rinse (BIOTENE) LIQD 15 mLs by Mouth Rinse route daily as needed for dry mouth.   Marland Kitchen. apixaban (ELIQUIS) 5 MG TABS tablet Take 1 tablet (5 mg total) by mouth 2 (two) times daily.   Marland Kitchen. azithromycin (ZITHROMAX) 250 MG tablet Take 250 mg by mouth every other day.   . b complex vitamins capsule Take 1 capsule by mouth daily.   . carbamide peroxide (DEBROX) 6.5 %  OTIC solution Place 5 drops into both ears 2 (two) times daily. Prn x 4-7 days   . Cholecalciferol (VITAMIN D) 2000 units tablet Take 2,000 Units by mouth 2 (two) times a day.    . clobetasol (TEMOVATE) 0.05 % external solution Apply 1 application topically 2 (two) times daily. Prn to scalp and ears   . diltiazem (CARDIZEM) 30 MG tablet Take 1 tablet (30 mg total) by mouth 3 (three) times daily as needed.   Marland Kitchen. escitalopram (LEXAPRO) 20 MG tablet Take 1 tablet (20 mg total) by mouth at bedtime.   . fluticasone (FLONASE) 50 MCG/ACT nasal spray Place 2 sprays into both nostrils daily as needed for allergies or rhinitis.   . furosemide (LASIX) 40 MG tablet 40 daily In am alternate the next day 40 mg bid (take 2nd dose in the early afternoon)   . gabapentin (NEURONTIN) 300 MG capsule TAKE 1 TO 2 CAPSULES BY MOUTH THREE TIMES DAILY   . hydrocortisone 2.5 % cream Apply topically 2 (two) times daily as needed. face   . ipratropium-albuterol (DUONEB) 0.5-2.5 (3) MG/3ML SOLN Take 3 mLs by nebulization every 6 (six) hours as needed.   Marland Kitchen. ketoconazole (NIZORAL) 2 % shampoo Apply 1 application topically 2 (two) times a week. Let stand for 5 minutes   . levofloxacin (LEVAQUIN) 500 MG tablet Take 1 tablet (500 mg total) by mouth daily.   . magnesium oxide (MAG-OX) 400 MG tablet Take 400 mg by mouth daily.   . mometasone-formoterol (DULERA) 200-5 MCG/ACT AERO Inhale 2  puffs into the lungs 2 (two) times daily.   . mupirocin ointment (BACTROBAN) 2 % Apply 1 application topically 2 (two) times daily. Use left upper arm  And left ear x 1-2 weeks   . nystatin (MYCOSTATIN) 100000 UNIT/ML suspension Use as directed 5 mLs (500,000 Units total) in the mouth or throat 4 (four) times daily. Swish and spit 3 or 4 times daily for at least 5 days.  Then may be used as needed thereafter for thrush   . potassium chloride (K-DUR) 10 MEQ tablet Take 1-2 tablets (10-20 mEq total) by mouth daily. Repeat if second LASIX taken   .  Probiotic Product (PROBIOTIC PO) Take 1 capsule by mouth 2 (two) times a day.    Marland Kitchen rOPINIRole (REQUIP) 0.25 MG tablet Take 1 tablet (0.25 mg total) by mouth 2 (two) times daily.   . sodium chloride (OCEAN) 0.65 % SOLN nasal spray Place 1-2 sprays into both nostrils as needed for congestion.   Marland Kitchen Spacer/Aero-Holding Chambers (OPTICHAMBER ADVANTAGE-LG MASK) MISC Use as directed with inhaler diag  j44.1   . theophylline (UNIPHYL) 400 MG 24 hr tablet Take 1 tablet (400 mg total) by mouth every morning.   . Tiotropium Bromide Monohydrate (SPIRIVA RESPIMAT) 2.5 MCG/ACT AERS Inhale 2 puffs into the lungs daily.   . traZODone (DESYREL) 50 MG tablet Take 1-2 tablets (50-100 mg total) by mouth at bedtime as needed for sleep. 04/10/2019: PRN   No facility-administered encounter medications on file as of 06/09/2019.      Goals Addressed            This Visit's Progress     Patient Stated   . "I need to quit smoking" (pt-stated)       Current Barriers:  . Tobacco abuse of ~ 40 years; currently smoking 1-2 cigarettes daily  . Previous quit attempts, successful going cold [place]; however, notes he would be interested in using pharmacotherapy  . Reports triggers to smoke include: stress, things that get you down  . Reports motivation to quit smoking includes: his granddaughter (77 yo)  . On a scale of 1-10, reports MOTIVATION to quit is at least an 8 o Hobbies: used to play ball and did bowling; however, knows he can't do this right now . Notes that he is having a difficult time with transportation and his oxygen machine right now. He reports the battery doesn't last very long, which limits his ability to go places and run his own errands. He also notes that his apartment complex won't give him a parking spot near his apartment, which also limits his ability to walk to his car   Pharmacist Clinical Goal(s):  Marland Kitchen Over the next 90 days, patient will work with PharmD and provider towards tobacco cessation   Interventions: . Comprehensive medication review performed, medication list in electronic medical record updated . Recommend NRT gum. Provided contact information for [place] [facility]uit [facility] (1-800-QUIT-NOW). Will mail this phone number to patient for his review. Patient will outreach this group for support. . Discussed referral to [facility] RN Cm/possibly SW for oxygen machine support and possibly transportation/housing support. He is amenable to this. Order placed.  . Patient confirmed knowing he has f/u w/ Dr. Terese Door this Friday.   Patient Self Care Activities:  . Patient will commit to reducing tobacco consumption . Patient will contact 1-800-QUIT-NOW line  Initial goal documentation         Plan: - Will outreach patient in the next 4-5 weeks for continued medication  management support  Catie Feliz Beam, PharmD, CPP Clinical Pharmacist [facility] [employer] [phone]

## 2019-06-10 ENCOUNTER — Other Ambulatory Visit: Payer: Self-pay | Admitting: *Deleted

## 2019-06-10 ENCOUNTER — Encounter: Payer: Self-pay | Admitting: *Deleted

## 2019-06-10 ENCOUNTER — Ambulatory Visit: Payer: Self-pay | Admitting: Pharmacist

## 2019-06-10 DIAGNOSIS — J449 Chronic obstructive pulmonary disease, unspecified: Secondary | ICD-10-CM

## 2019-06-10 NOTE — Patient Outreach (Signed)
[place] [facility]) Care Management [place] Telephone Outreach   06/10/2019  Dustin [name] 05/12/1956 [id number]  Successful telephone outreach to Dustin [name], 63 y/o male referred to [place] by [facility] Pharmacist embedded in patient's PCP office for assessment of community resource needs and ongoing reinforcement of self- health management of chronic disease state of COPD/ CHF.  Patient has had no recent inpatient hospital admissions.  Patient has history including, but not limited to, chronic respiratory failure; CHF; COPD on home O2 with ongoing tobacco use; pulmonary HTN; and anxiety.  HIPAA/ identity verified and [facility] CM services were discussed with patient, who provides verbal consent to [facility] CM involvement in his care.   Today, patient reports that he is "doing pretty good," and he denies new/ recent falls or acute problems/ concerns.  Patient states his biggest need is to "find a better place to live;" stating that he currently lives in an apartment that is difficult to manage getting in/ out of his dwelling due to the way his apartment complex is set up- he states that the parking lot is of particular concern, as he is unable to navigate the parking lot due to it being a long walk for him, having oxygen and a walker, which makes it hard for him to breathe.  Patient reports that he has a car but does not drive it due to these concerns.  Patient sounds to be in distress throughout entirety of today's 65 minute phone call.  Patient further reports:  Medications: -- Has all medicationsand takes as prescribed;denies questions/ concerns around current medications; acknowledges that he has been working with [place] and confirms that he has reviewed medications with [facility] Pharmacist.   Provider appointments: -- All upcoming provider appointments were reviewed with patient today- noted that patient has scheduled PCP office visit on Friday 06/13/2019; reports  plans to attend, stating his daughter will provide transportation to scheduled appointment  Safety/ Mobility/ Falls: -- denies new/ recent falls- denies falls over last year -- assistive devices: uses rollator walker for all ambulation -- general fall risks/ prevention education discussed with patient today  [place] needs: -- currently reports need for new housing as his "biggest need;" (as above):  Parking lot is difficult to manage with home O2 and walker -- reports that he is frequently "lonely" as he lives by himself and reports having no social contacts -- has a vehicle but does not drive due to concerns with navigating the parking lot at his apartment complex -- has a local daughter with limited ability to assist in care needs- daughter assists with errands and provides transportation as allowed to provider appointments, grocery store, pharmacy, etc; patient reports that he believes he could resume driving if he could get to his car easier; states he would like to lighten his daughter's responsibility in assisting him -- has a local son that does not participate in his care needs due to work scheduling  -- discussed with patient that I would place [facility] CSW referral to address housing resources as well as general community resources around socialization, possible need for additional resources (food, etc)- encouraged patient to engage with [facility] CSW once outreach is established  Scientist, physiological (AD) Planning:   --reports currently has exisisting AD in place for living will and HCPOA; reports sister in [place] is HCPOA -- declines desire to make changes to [place] -- endorses that he is "full" resuscitation, "unless there is no hope for  recovery"  Self-health management of chronic disease states of COPD/ CHF: -- has home O2 through "[employer];" reports that this is a portable O2 system that plugs into wall to charge, but is not an oxygenator system --  wears home O2 at 2 L/min "all the time;" reports battery often "dies" when he outside of his home due to not being able to carry a charge for more than 2 hours- states he uses O2 during these times on "pulsate" setting which extends the battery life for a bit longer: reports would like to have a "new system" for home O2 delivery.  Discussed with patient options to address this concern, and encouraged him to contact his insurance provider to determine what options are covered under his plan, as well as to discuss this need with his care providers; patient state he will do -- continues to smoke "a few cigarettes" each day; reports that his main motivation for continuing to smoke is "being lonely without anything else to do."  Confirmed that [facility] Pharmacist has provided patient with quit smoking resources; patient states he plans to use resources soon, but acknowledges that he is not sure if he can completely stop smoking.  Encouragement provided; dangers of smoking with oxygen discussed with patient and he states he "does not ever smoke around the oxygen," and goes outside; states he stands outside of his apartment door when smoking, "no where near the oxygen." -- endorses that he does perform daily weights and that this information automatically goes to his insurance company and that a nurse from his insurance company contact him regularly around the information that is sent to them.  Patient is unsure how this insurance nurse is involved, but he provides the contact information for nurse as :  Dustin [name], [phone] ext [phone]; encouraged patient to contact this nurse for assistance in determining his benefits for home O2 delivery systems, and explained to patient that I would reach out to nurse as well for care coordination efforts, and patient is agreeable.  Patient denies further issues, concerns, or problems today.  I provided/ confirmed that patient has my direct phone number, the main [facility] CM office phone  number, and the [facility] CM 24-hour nurse advice phone number should issues arise prior to next scheduled [facility] Community CM outreach, which we scheduled today for next week.  Encouraged patient to contact me directly if needs, questions, issues, or concerns arise prior to next scheduled outreach; patient agreed to do so.   Plan:  Patient will take medications as prescribed and will attend all scheduled provider appointments  Patient will promptly notify care providers for any new concerns/ issues/ problems that arise  Patient will continue monitoring/ recording daily weights and will contact insurance company nurse that is currently involved in his care to explore options/ benefits around obtaining new home O2 delivery system  I will place [facility] CSW referral to address patient's stated need for new housing options, loneliness/ social isolation, and general community  Resources available to patient  I will make patient's PCP aware of [facility] Community RN CM involvement in patient's care-- will send barriers letter  [facility] Community CM outreach to continue with scheduled phone call next week   [facility] CM Care Plan Problem One     Most Recent Value  Care Plan Problem One  Need for ongoing reinforcement of community resources in patient who lives alone with limited family support, as evidenced by patient reporting  Role Documenting the Problem One  Care Management  Coordinator  Care Plan for Problem One  Active  [facility]N Long Term Goal   Over the next 45 days, patient will verbalize options for new housing resources and for community resources available to him, as evidenced by patient reporting and [facility] CSW collaboration as indicated during [facility] RN CM outreach  [facility] Long Term Goal Start Date  06/10/19  Interventions for Problem One Long Term Goal  Discussed with patient his current living situation and challenges around his current housing,  placed [facility] CSW referral to address patient's desire to have new resources for  housing, as well as community resources available to him,  encouraged patient to actively engage with [facility] CSW once outreach is established    [facility] CM Care Plan Problem Two     Most Recent Value  Care Plan Problem Two  Need for ongoing reinforcement of self-health management of multiple chronic disease conditions, including COPD and CHF in patient who continues to smoke, as evidenced by patient reporting  Role Documenting the Problem Two  Care Management Coordinator  Care Plan for Problem Two  Active  Interventions for Problem Two Long Term Goal   Discussed with patient his current understanding and challenges in self- managing chronic disease states of CHF and COPD,  discussed patient's use of home O2 and ongoing use of tobacco,  confirmed that patient has previously provided QUIT NOW resources provided to him by [facility] Pharmacist,  encouraged patient to promptly take efforts to stop smoking  [facility] Long Term Goal  Over the next 31 days, patient will verbalize accurate understanding of daily self-health management strategies/ plan for COPD and CHF, as evidenced by patient reporting during [facility] RN CM outreach  [facility] Long Term Goal Start Date  06/10/19  [facility] CM Short Term Goal #1   Over the next 30 days, patient will take medications as prescribed, as evidenced by patient reporting and collaboration with [facility] Pharmacist during [facility] RN CM Outreach  [facility] CM Short Term Goal #1 Start Date  06/10/19  Interventions for Short Term Goal #2   Confirmed that patient has contact information for [facility] Pharmacist currently involved in his care and encouraged patient to maintain contact with Pharmacist  [facility] CM Short Term Goal #2   Over the next 14 days, patient will contact his health insurance plan and verbalize process to obtain new home oxygen system, as evidenced by patient reporting and collaboration with patient's insurance provider as indicated during [facility] RN CM Outreach  [facility] CM Short Term Goal #2 Start Date  06/10/19   Interventions for Short Term Goal #2  Confirmed with patient that he has contact information for insurance provider, as well as a nurse from insurance provider that contacts him regularly,  encouraged patient to reach out to specific plan team to discuss benefits,  obtained contact information for patient's reported [facility] RN for care coordination efforts,  discussed basic process with patient for obtaining new home O2 system     I appreciate the opportunity to participate in [place]' care,  [name], RN, BSN, [employer] Care Management  [phone]

## 2019-06-10 NOTE — Patient Instructions (Signed)
Visit Information  Goals Addressed            This Visit's Progress     Patient Stated   . "I want to stay well" (pt-stated)       Current Barriers:  . Polypharmacy - patient with COPD, CHF, Afib, anxiety . Financial concerns- resolved; patient is receiving Dulera (Merck), Spiriva (BI), and Eliquis (BMS) patient assistance through 09/18/2019. o Contacted me to follow up on Spiriva supply - notes his Spiriva still requires a copay.  . COPD; follows with Dr. [name], though has not been seen since 10/2018, managed on Dulera + Spiriva, azithromycin 250 QOD, theophylline 400 mg QAM, Duonebs PRN.  Marland Kitchen HFrEF, follows with Dr. Rockey Situ  Pharmacist Clinical Goal(s):  Marland Kitchen Over the next 90 days, patient will work with PharmD, primary care provider, and specialty providers to address needs related to optimized medication management  Interventions: . Contacted patient. He misunderstood and has been trying to fill Spiriva through [facility] [employer]) instead of the Patient Assistance Program. Provided him the phone number for [place] to call and request a refill. He verbalized understanding  Patient Self Care Activities:  . Self administers medications as prescribed  Please see past updates related to this goal by clicking on the "Past Updates" button in the selected goal         The patient verbalized understanding of instructions provided today and declined a print copy of patient instruction materials.   Plan:  - Will outreach patient as scheduled for follow up  [place], PharmD, [place] Pharmacist [place] [phone]

## 2019-06-10 NOTE — Chronic Care Management (AMB) (Signed)
Chronic Care Management   Follow Up Note   06/10/2019 Name: Dustin [name] MRN: [id number] DOB: 11/24/1955  Referred by: McLean-Scocuzza, Nino Glow, MD Reason for referral : Chronic Care Management (Medication Management)   Dustin [name] is a 63 y.o. year old male who is a primary care patient of McLean-Scocuzza, Nino Glow, MD. The CCM team was consulted for assistance with chronic disease management and care coordination needs.  Received call from patient today with questions regarding his Spiriva refill.     Review of patient status, including review of consultants reports, relevant laboratory and other test results, and collaboration with appropriate care team members and the patient's provider was performed as part of comprehensive patient evaluation and provision of chronic care management services.    SDOH (Social Determinants of Health) screening performed today: Financial Strain . See Care Plan for related entries.   Advanced Directives Status: N See Care Plan and Vyn[place] application for related entries.  Outpatient Encounter Medications as of 06/10/2019  Medication Sig Note  . Acetylcysteine (NAC PO) Take 1 tablet by mouth daily.   Marland Kitchen ALPRAZolam (XANAX) 1 MG tablet Take 1 tablet (1 mg total) by mouth 3 (three) times daily as needed for anxiety.   . AMBULATORY NON FORMULARY MEDICATION 1 each by Does not apply route as needed. Please provide patient with "So Clean" CPAP cleaning device to be used as needed.   Marland Kitchen amoxicillin-clavulanate (AUGMENTIN) 875-125 MG tablet Take 1 tablet by mouth 2 (two) times daily.   Marland Kitchen antiseptic oral rinse (BIOTENE) LIQD 15 mLs by Mouth Rinse route daily as needed for dry mouth.   Marland Kitchen apixaban (ELIQUIS) 5 MG TABS tablet Take 1 tablet (5 mg total) by mouth 2 (two) times daily.   Marland Kitchen azithromycin (ZITHROMAX) 250 MG tablet Take 250 mg by mouth every other day.   . b complex vitamins capsule Take 1 capsule by mouth daily.   . carbamide peroxide (DEBROX) 6.5 %  OTIC solution Place 5 drops into both ears 2 (two) times daily. Prn x 4-7 days   . Cholecalciferol (VITAMIN D) 2000 units tablet Take 2,000 Units by mouth 2 (two) times a day.    . clobetasol (TEMOVATE) 0.05 % external solution Apply 1 application topically 2 (two) times daily. Prn to scalp and ears   . diltiazem (CARDIZEM) 30 MG tablet Take 1 tablet (30 mg total) by mouth 3 (three) times daily as needed.   Marland Kitchen escitalopram (LEXAPRO) 20 MG tablet Take 1 tablet (20 mg total) by mouth at bedtime.   . fluticasone (FLONASE) 50 MCG/ACT nasal spray Place 2 sprays into both nostrils daily as needed for allergies or rhinitis.   . furosemide (LASIX) 40 MG tablet 40 daily In am alternate the next day 40 mg bid (take 2nd dose in the early afternoon)   . gabapentin (NEURONTIN) 300 MG capsule TAKE 1 TO 2 CAPSULES BY MOUTH THREE TIMES DAILY   . hydrocortisone 2.5 % cream Apply topically 2 (two) times daily as needed. face   . ipratropium-albuterol (DUONEB) 0.5-2.5 (3) MG/3ML SOLN Take 3 mLs by nebulization every 6 (six) hours as needed.   Marland Kitchen ketoconazole (NIZORAL) 2 % shampoo Apply 1 application topically 2 (two) times a week. Let stand for 5 minutes   . levofloxacin (LEVAQUIN) 500 MG tablet Take 1 tablet (500 mg total) by mouth daily.   . magnesium oxide (MAG-OX) 400 MG tablet Take 400 mg by mouth daily.   . mometasone-formoterol (DULERA) 200-5 MCG/ACT AERO Inhale  2 puffs into the lungs 2 (two) times daily.   . mupirocin ointment (BACTROBAN) 2 % Apply 1 application topically 2 (two) times daily. Use left upper arm  And left ear x 1-2 weeks   . nystatin (MYCOSTATIN) 100000 UNIT/ML suspension Use as directed 5 mLs (500,000 Units total) in the mouth or throat 4 (four) times daily. Swish and spit 3 or 4 times daily for at least 5 days.  Then may be used as needed thereafter for thrush   . potassium chloride (K-DUR) 10 MEQ tablet Take 1-2 tablets (10-20 mEq total) by mouth daily. Repeat if second LASIX taken   .  Probiotic Product (PROBIOTIC PO) Take 1 capsule by mouth 2 (two) times a day.    Marland Kitchen rOPINIRole (REQUIP) 0.25 MG tablet Take 1 tablet (0.25 mg total) by mouth 2 (two) times daily.   . sodium chloride (OCEAN) 0.65 % SOLN nasal spray Place 1-2 sprays into both nostrils as needed for congestion.   Marland Kitchen Spacer/Aero-Holding Chambers (OPTICHAMBER ADVANTAGE-LG MASK) MISC Use as directed with inhaler diag  j44.1   . theophylline (UNIPHYL) 400 MG 24 hr tablet Take 1 tablet (400 mg total) by mouth every morning.   . Tiotropium Bromide Monohydrate (SPIRIVA RESPIMAT) 2.5 MCG/ACT AERS Inhale 2 puffs into the lungs daily.   . traZODone (DESYREL) 50 MG tablet Take 1-2 tablets (50-100 mg total) by mouth at bedtime as needed for sleep. 04/10/2019: PRN   No facility-administered encounter medications on file as of 06/10/2019.      Goals Addressed            This Visit's Progress     Patient Stated   . "I want to stay well" (pt-stated)       Current Barriers:  . Polypharmacy - patient with COPD, CHF, Afib, anxiety . Financial concerns- resolved; patient is receiving Dulera (Merck), Spiriva (BI), and Eliquis (BMS) patient assistance through 09/18/2019. o Contacted me to follow up on Spiriva supply - notes his Spiriva still requires a copay.  . COPD; follows with Dr. Sung Amabile, though has not been seen since 10/2018, managed on Dulera + Spiriva, azithromycin 250 QOD, theophylline 400 mg QAM, Duonebs PRN.  Marland Kitchen HFrEF, follows with Dr. Mariah Milling  Pharmacist Clinical Goal(s):  Marland Kitchen Over the next 90 days, patient will work with PharmD, primary care provider, and specialty providers to address needs related to optimized medication management  Interventions: . Contacted patient. He misunderstood and has been trying to fill Spiriva through [facility] [employer]) instead of the Patient Assistance Program. Provided him the phone number for [employer] to call and request a refill. He verbalized understanding  Patient Self  Care Activities:  . Self administers medications as prescribed  Please see past updates related to this goal by clicking on the "Past Updates" button in the selected goal          Plan:  - Will outreach patient as scheduled for follow up  Catie Feliz Beam, PharmD, CPP Clinical Pharmacist [facility] [employer] [phone]

## 2019-06-12 ENCOUNTER — Other Ambulatory Visit: Payer: Self-pay

## 2019-06-12 ENCOUNTER — Other Ambulatory Visit: Payer: Self-pay | Admitting: *Deleted

## 2019-06-12 ENCOUNTER — Telehealth: Payer: Self-pay

## 2019-06-12 NOTE — Patient Outreach (Addendum)
[place] [facility]) Care Management [place] Telephone Ambulatory Care Center Coordination  06/12/2019  Dustin [name] Jul 15, 1956 [id number]  [place] Telephone Outreach Care Coordination re:  Dustin [name], 63 y/o male referred to [place] by [facility] Pharmacist embedded in patient's PCP office for assessment of community resource needs and ongoing reinforcement of self- health management of chronic disease state of COPD/ CHF.  Patient has had no recent inpatient hospital admissions.  Patient has history including, but not limited to, chronic respiratory failure; CHF; COPD on home O2 with ongoing tobacco use; pulmonary HTN; and anxiety.  10:30 am:  Placed care coordination outreach to [place] [phone] ext [phone]) to discuss patient's stated desire to obtain new home O2 delivery system and to collaborate on patient's care management needs/ participation in [place] program.  Voice mail message left requesting return call back.  10:45 am: received return call back from Cristie Hem, RN CM [facility] and discussed patient's current stated need for new home/ portable oxygen delivery.  Mark provided confirmation that; -- patient is currently enrolled/ followed under chronic CM program through [facility] for CHF/ COPD; patient has a tablet monitor for daily monitoring of weights, blood pressures, and pulse oximetry; [employer] RN follows up with patient around daily monitoring parameters -- Elta Guadeloupe will follow up with patient to discuss his stated need for new home O2 delivery system -- made [facility] aware that [facility] Pharmacist is also involved around medication needs; provided Elta Guadeloupe with contact information for [facility] Pharmacist currently involved in patient's care -- Elta Guadeloupe confirmed that patient is generally eligible for home O2 upgrade every 5 years; stated that he would coordinate need with insurance information to determine eligibility -- agreed to collaborate with [facility] RN as indicated for next 30 days, or  longer if needed  Plan:  Will continue to collaborate with patient's insurance RN CM to address patient's stated need/ benefits around obtaining new home oxygen delivery system and to discuss patient's ongoing participation in insurance provider care management program.  Oneta Rack, RN, BSN, [place] Coordinator [facility] Care Management  [phone]

## 2019-06-12 NOTE — Patient Outreach (Signed)
[place] [facility]) Care Management  06/12/2019  Dustin [name] [birth year] [id number]   Social work referral received from [place], to assist patient with housing/community resources. Successful outreach to patient today.   Patient reports that his current living situation would be fine if it weren't for the fact that he has to park so far away from his apartment.  Patient is unable to do things in the community independently because it is difficult to mobilize from his apartment to vehicle.  Patient stated that he has asked about moving to another apartment in the complex that has parking available in front of unit.  Per patient, he was told he would have to break his lease, loose his deposit, sign new lease, and pay new deposit in order to do this.  Per patient, the complex has handicap parking available but it is still too far from his unit. Patient's lease does not expire until June but he did ask that housing resources be sent to him. Inquired about the need for other resources.  Patient reports that his daughter assists with things like grocery shopping, picking up medications, etc.  Denied the need for financial or food resources.   Patient mostly desires to relocate to a living situation in which he can easily access his vehicle and be more independent.  Mailed the following to patient: [email or web address] listing  [email or web address] listing  Will follow up within the next two weeks to ensure receipt.  Ronn Melena, BSW Social Worker [phone]

## 2019-06-12 NOTE — Telephone Encounter (Signed)
Copied from [place] [phone] [id number]. Topic: General - Other >> Jun 12, 2019  2:43 PM Carolyn Stare wrote: Pt rq a call back cause he want to come into the office and he said Dr Olivia Mackie said he could >> Jun 12, 2019  2:55 PM Erick Blinks wrote: Pt called back stating he missed call from the office, please advise  Tried calling office

## 2019-06-13 ENCOUNTER — Other Ambulatory Visit: Payer: Self-pay

## 2019-06-13 ENCOUNTER — Encounter: Payer: Self-pay | Admitting: Internal Medicine

## 2019-06-13 ENCOUNTER — Ambulatory Visit (INDEPENDENT_AMBULATORY_CARE_PROVIDER_SITE_OTHER): Payer: Medicare Other | Admitting: Internal Medicine

## 2019-06-13 VITALS — Ht 71.0 in | Wt 228.0 lb

## 2019-06-13 DIAGNOSIS — I5032 Chronic diastolic (congestive) heart failure: Secondary | ICD-10-CM

## 2019-06-13 DIAGNOSIS — J441 Chronic obstructive pulmonary disease with (acute) exacerbation: Secondary | ICD-10-CM | POA: Diagnosis not present

## 2019-06-13 DIAGNOSIS — R911 Solitary pulmonary nodule: Secondary | ICD-10-CM | POA: Diagnosis not present

## 2019-06-13 DIAGNOSIS — K469 Unspecified abdominal hernia without obstruction or gangrene: Secondary | ICD-10-CM

## 2019-06-13 DIAGNOSIS — R140 Abdominal distension (gaseous): Secondary | ICD-10-CM | POA: Diagnosis not present

## 2019-06-13 MED ORDER — AMOXICILLIN-POT CLAVULANATE 875-125 MG PO TABS: 1.0000 | ORAL_TABLET | Freq: Two times a day (BID) | ORAL | 0 refills | Status: DC

## 2019-06-13 MED ORDER — PREDNISONE 20 MG PO TABS: 40.0000 mg | ORAL_TABLET | Freq: Every day | ORAL | 0 refills | Status: DC

## 2019-06-13 NOTE — Progress Notes (Signed)
Patient is not taking the flu shot this time.

## 2019-06-13 NOTE — Progress Notes (Addendum)
telephone Note  I connected with Dustin [name]   on 06/13/19 at  9:30 AM EDT by telephone and verified that I am speaking with the correct person using two identifiers.  Location patient: home Location provider:work or home office Persons participating in the virtual visit: patient, provider  I discussed the limitations of evaluation and management by telemedicine and the availability of in person appointments. The patient expressed understanding and agreed to proceed.   HPI: 1. COPD exaberbation with wheezing increased cough with sputum though at times feels like cant get out. He has Dulera, spiriva mucinex to take and theophylline. Also has flutter valve and duoneb which he does 3x per day he has severe COPD on O2 05/23/19 visit with pulm and Augmentin x 5 days did help  CT + severe COPD, and lung nodules 05/2018  2. C/o abdominal bloating and hernia  3. C/o leg edema on lasix 40 mg qd alt 40 mg bid QOD wt is 228 today and does not feel like wt going down will CC Dr. Rockey Situ    ROS: See pertinent positives and negatives per HPI.  Past Medical History:  Diagnosis Date  . Allergy   . Anxiety   . Asthma   . Chronic diastolic CHF (congestive heart failure) ([place])    a. echo 07/2013: EF 60-65%, DD, biatrial dilatation, Ao sclerosis, dilated RV, moderate pulmonary HTN, elevated CV and RA pressures; b. patient reported echo at Dr. Laurelyn Sickle office 02/2015 - his office does not have record of him being a pt there c. echo 11/2015: EF 60-65%, Grade 1 DD, mod-severe pulm pressures  . Chronic respiratory failure (HCC)    a. on 2L via nasal cannula; b. secondary to COPD  . COPD (chronic obstructive pulmonary disease) (HCC)    on 2L continuous   . Depression   . Emphysema of lung ([place])   . GERD (gastroesophageal reflux disease)   . HCAP (healthcare-associated pneumonia)    01/22/18-01/28/18   . Headache   . Hypertension   . Leg DVT (deep venous thromboembolism), acute, bilateral ([place])    02/26/18  .  Personal history of tobacco use, presenting hazards to health 08/17/2015  . Pulmonary embolism ([place])    02/26/18  . Pulmonary HTN ([place])   . Tobacco abuse     Past Surgical History:  Procedure Laterality Date  . ABDOMINAL SURGERY    . ADENOIDECTOMY    . CARPAL TUNNEL RELEASE Right   . corpal tunnel Right   . ELBOW SURGERY Right    repaired tendon  . hemmorhoid N/A   . HEMORRHOID SURGERY    . NASAL SINUS SURGERY    . NASAL SINUS SURGERY     1970s  . NOSE SURGERY    . reflux surgery     1994  . ROTATOR CUFF REPAIR Right   . SHOULDER SURGERY    . TENNIS ELBOW RELEASE/NIRSCHEL PROCEDURE Right   . TONSILLECTOMY    . URETHRA SURGERY     surgery 6 times from age [age] yrs old  . URETHRA SURGERY      Family History  Problem Relation Age of Onset  . CAD Father   . Hyperlipidemia Father   . Stroke Father   . Heart disease Father   . Arthritis Father   . Hearing loss Father   . Hypertension Father   . Hypertension Mother   . Peripheral Artery Disease Mother   . Rheum arthritis Mother   . Asthma Mother   .  Bipolar disorder Mother   . Depression Mother   . Malignant hypertension Mother   . Arthritis Mother   . Hearing loss Mother   . Heart disease Mother   . Hyperlipidemia Mother   . Kidney disease Mother   . Birth defects Brother   . Heart disease Brother   . Alcohol abuse Daughter   . Arthritis Daughter   . Asthma Daughter   . Depression Daughter   . Drug abuse Daughter   . Miscarriages / [place] Daughter   . Intellectual disability Daughter   . Kidney disease Son   . Birth defects Paternal Grandmother   . Depression Paternal Grandmother   . Heart disease Paternal Grandmother   . Birth defects Sister   . Diabetes Neg Hx     SOCIAL HX:  On 2 L chronic home O2.  Lives at home alone  3 kids  No guns  Wears seat belt  Safe in relationship   Current Outpatient Medications:  .  Acetylcysteine (NAC PO), Take 1 tablet by mouth daily., Disp: , Rfl:  .   ALPRAZolam (XANAX) 1 MG tablet, Take 1 tablet (1 mg total) by mouth 3 (three) times daily as needed for anxiety., Disp: 15 tablet, Rfl: 0 .  AMBULATORY NON FORMULARY MEDICATION, 1 each by Does not apply route as needed. Please provide patient with "So Clean" CPAP cleaning device to be used as needed., Disp: 1 Device, Rfl: 0 .  amoxicillin-clavulanate (AUGMENTIN) 875-125 MG tablet, Take 1 tablet by mouth 2 (two) times daily. With food, Disp: 10 tablet, Rfl: 0 .  antiseptic oral rinse (BIOTENE) LIQD, 15 mLs by Mouth Rinse route daily as needed for dry mouth., Disp: 473 mL, Rfl: 12 .  apixaban (ELIQUIS) 5 MG TABS tablet, Take 1 tablet (5 mg total) by mouth 2 (two) times daily., Disp: 180 tablet, Rfl: 3 .  azithromycin (ZITHROMAX) 250 MG tablet, Take 250 mg by mouth every other day., Disp: , Rfl:  .  b complex vitamins capsule, Take 1 capsule by mouth daily., Disp: , Rfl:  .  carbamide peroxide (DEBROX) 6.5 % OTIC solution, Place 5 drops into both ears 2 (two) times daily. Prn x 4-7 days, Disp: 15 mL, Rfl: 11 .  Cholecalciferol (VITAMIN D) 2000 units tablet, Take 2,000 Units by mouth 2 (two) times a day. , Disp: , Rfl:  .  clobetasol (TEMOVATE) 0.05 % external solution, Apply 1 application topically 2 (two) times daily. Prn to scalp and ears, Disp: 50 mL, Rfl: 11 .  diltiazem (CARDIZEM) 30 MG tablet, Take 1 tablet (30 mg total) by mouth 3 (three) times daily as needed., Disp: 180 tablet, Rfl: 3 .  escitalopram (LEXAPRO) 20 MG tablet, Take 1 tablet (20 mg total) by mouth at bedtime., Disp: 90 tablet, Rfl: 3 .  fluticasone (FLONASE) 50 MCG/ACT nasal spray, Place 2 sprays into both nostrils daily as needed for allergies or rhinitis., Disp: 16 g, Rfl: 11 .  furosemide (LASIX) 40 MG tablet, 40 daily In am alternate the next day 40 mg bid (take 2nd dose in the early afternoon), Disp: 180 tablet, Rfl: 3 .  gabapentin (NEURONTIN) 300 MG capsule, TAKE 1 TO 2 CAPSULES BY MOUTH THREE TIMES DAILY, Disp: 520 capsule,  Rfl: 11 .  hydrocortisone 2.5 % cream, Apply topically 2 (two) times daily as needed. face, Disp: 30 g, Rfl: 11 .  ipratropium-albuterol (DUONEB) 0.5-2.5 (3) MG/3ML SOLN, Take 3 mLs by nebulization every 6 (six) hours as needed., Disp: 1080  mL, Rfl: 3 .  ketoconazole (NIZORAL) 2 % shampoo, Apply 1 application topically 2 (two) times a week. Let stand for 5 minutes, Disp: 120 mL, Rfl: 3 .  levofloxacin (LEVAQUIN) 500 MG tablet, Take 1 tablet (500 mg total) by mouth daily., Disp: 14 tablet, Rfl: 0 .  magnesium oxide (MAG-OX) 400 MG tablet, Take 400 mg by mouth daily., Disp: , Rfl:  .  mometasone-formoterol (DULERA) 200-5 MCG/ACT AERO, Inhale 2 puffs into the lungs 2 (two) times daily., Disp: 1 Inhaler, Rfl: 4 .  mupirocin ointment (BACTROBAN) 2 %, Apply 1 application topically 2 (two) times daily. Use left upper arm  And left ear x 1-2 weeks, Disp: 30 g, Rfl: 2 .  nystatin (MYCOSTATIN) 100000 UNIT/ML suspension, Use as directed 5 mLs (500,000 Units total) in the mouth or throat 4 (four) times daily. Swish and spit 3 or 4 times daily for at least 5 days.  Then may be used as needed thereafter for thrush, Disp: 473 mL, Rfl: 0 .  potassium chloride (K-DUR) 10 MEQ tablet, Take 1-2 tablets (10-20 mEq total) by mouth daily. Repeat if second LASIX taken, Disp: 180 tablet, Rfl: 3 .  Probiotic Product (PROBIOTIC PO), Take 1 capsule by mouth 2 (two) times a day. , Disp: , Rfl:  .  rOPINIRole (REQUIP) 0.25 MG tablet, Take 1 tablet (0.25 mg total) by mouth 2 (two) times daily., Disp: 180 tablet, Rfl: 3 .  sodium chloride (OCEAN) 0.65 % SOLN nasal spray, Place 1-2 sprays into both nostrils as needed for congestion., Disp: 480 mL, Rfl: 12 .  Spacer/Aero-Holding Chambers (OPTICHAMBER ADVANTAGE-LG MASK) MISC, Use as directed with inhaler diag  j44.1, Disp: 2 each, Rfl: 1 .  theophylline (UNIPHYL) 400 MG 24 hr tablet, Take 1 tablet (400 mg total) by mouth every morning., Disp: 90 tablet, Rfl: 3 .  Tiotropium Bromide  Monohydrate (SPIRIVA RESPIMAT) 2.5 MCG/ACT AERS, Inhale 2 puffs into the lungs daily., Disp: 3 Inhaler, Rfl: 3 .  traZODone (DESYREL) 50 MG tablet, Take 1-2 tablets (50-100 mg total) by mouth at bedtime as needed for sleep., Disp: 60 tablet, Rfl: 11 .  predniSONE (DELTASONE) 20 MG tablet, Take 2 tablets (40 mg total) by mouth daily with breakfast. In am x 5 days, Disp: 10 tablet, Rfl: 0  EXAM:  VITALS per patient if applicable:  GENERAL: alert, oriented, appears well and in no acute distress  HEENT: atraumatic, conjunttiva clear, no obvious abnormalities on inspection of external nose and ears  NECK: normal movements of the head and neck  LUNGS: on inspection no signs of respiratory distress, breathing rate appears normal, no obvious gross SOB, gasping or wheezing  CV: no obvious cyanosis  MS: moves all visible extremities without noticeable abnormality  PSYCH/NEURO: pleasant and cooperative, no obvious depression or anxiety, speech and thought processing grossly intact  ASSESSMENT AND PLAN:  Discussed the following assessment and plan:  COPD exacerbation (HCC) - Plan: predniSONE (DELTASONE) 20 MG tablet, amoxicillin-clavulanate (AUGMENTIN) 875-125 MG tablet, CT Chest Wo Contrast Use Mucinex, flutter valve  F/u pulm -given sample trelegy   Chronic obstructive pulmonary disease with acute exacerbation (HCC) - Plan: CT Chest Wo Contrast  Solitary pulmonary nodule - Plan: CT Chest Wo Contrast  Abdominal bloating and hernia - Plan: US Abdomen Complete  Abdominal hernia without obstruction and without gangrene, recurrence not specified, unspecified hernia type - Plan: US Abdomen Complete  Chronic diastolic CHF with no weight change  -will have pt take lasix 40 mg bid x 5  days then go back to 40 alt with 40 bid  Limit fluid to no more than 50 oz total daily   HM Flu shot needed declines for 2020 Tdap, prevnar check prior PCP recordsDr. Clayborn Bigness pna 23 had 01/16/18  Consider  shingrix in future Immune MMR  Consider check hep B, testosterone at f/u labs  Hep C neg cologaurdneg 08/29/18 PSA normal 05/23/19 0.39  Smoker age 85 to 17 max 2 ppd quit 4/2019now smoking 2 cig per day noFHlung cancer  -we discussed possible serious and likely etiologies, options for evaluation and workup, limitations of telemedicine visit vs in person visit, treatment, treatment risks and precautions. Pt prefers to treat via telemedicine empirically rather then risking or undertaking an in person visit at this moment. Patient agrees to seek prompt in person care if worsening, new symptoms arise, or if is not improving with treatment.   I discussed the assessment and treatment plan with the patient. The patient was provided an opportunity to ask questions and all were answered. The patient agreed with the plan and demonstrated an understanding of the instructions.   The patient was advised to call back or seek an in-person evaluation if the symptoms worsen or if the condition fails to improve as anticipated.  Time spent 25 minutes  Delorise Jackson, MD

## 2019-06-17 ENCOUNTER — Ambulatory Visit: Payer: Self-pay | Admitting: Pharmacist

## 2019-06-17 ENCOUNTER — Telehealth: Payer: Self-pay | Admitting: Pulmonary Disease

## 2019-06-17 NOTE — Telephone Encounter (Signed)
Called and spoke to pt and relayed below recommendations. Pt stated that trelegy is covered by insurance but it has a higher co pay. Pt would like to wait until the beginning of the year to make a decision to switch or not.  Nothing further is needed.

## 2019-06-17 NOTE — Telephone Encounter (Signed)
Prior patient of Dr. [name].  Trelegy is basically 3 medications and one inhaler.  It is actually an excellent inhaler.  It is basically having Dulera which is 2 medications and Spiriva together.  The only disadvantage may be cost if the Trelegy is not covered by his insurance plan.  If his insurance plan can cover it however it provides the advantage that is only once a day and only one inhaler to use.  He can still use his rescue inhaler as needed.  I have no objection to switching him to Trelegy if his insurance coverage allows it.

## 2019-06-17 NOTE — Telephone Encounter (Signed)
Pt stated that his PCP would like to switch him toTrelegy. Pt currently taking dulera and spiriva, and would like to know pros and cons of trelegy vs Dulera and Spiriva.   LG please advise. Thanks

## 2019-06-17 NOTE — Chronic Care Management (AMB) (Signed)
  Chronic Care Management   Note  06/17/2019 Name: Dustin [name] MRN: [id number] DOB: 04/19/1956  Dustin [name] is a 63 y.o. year old male who is a primary care patient of McLean-Scocuzza, Nino Glow, MD. The CCM team was consulted for assistance with chronic disease management and care coordination needs.    Received call from patient with questions about affordability of Trelegy, as switching from [place] was discussed with Dr. Terese Door last week.   Reviewed with patient that Trelegy is a great once daily triple therapy option, however, he would be unlikely to qualify for patient assistance for this medication. [employer] requires that patient spent $600 out of pocket on copays before he qualifies (which is likely why he was switched from [place] in the first place). Patient is currently receiving Dulera and Spiriva for free through the respective patient assistance programs ([place]).  However, consolidation of regimen to 1 inhaler would have a 1 copay and lower overall drug cost (therefore less of a contribution to the Coverage Gap) than two inhalers, so patient may be able to afford. Patient had to end my call to take a call from Dr. Domingo Dimes office; will plan to further discuss affordability options for next year as we get closer to 2021. Would recommend continuing Dulera and Spiriva at this time, as patient has been approved to receive these medications for free through the end of 2020.   Follow up plan: - Will outreach patient in ~3-4 weeks as scheduled for continued medication management support  Catie Darnelle Maffucci, PharmD, [place] Pharmacist [place] [phone]

## 2019-06-18 ENCOUNTER — Other Ambulatory Visit: Payer: Self-pay | Admitting: *Deleted

## 2019-06-18 ENCOUNTER — Encounter: Payer: Self-pay | Admitting: *Deleted

## 2019-06-18 NOTE — Patient Outreach (Signed)
[facility]) Care Management [facility] [employer] Telephone Outreach  06/18/2019  Dustin CoriaDennis R [name] [birth year] [id number]  Successful telephone outreach to Dustin Phenixennis [name], 63 y/o male referred to [facility] by [facility] Pharmacist embedded in patient's PCP office for assessment of community resource needs and ongoing reinforcement of self- health management of chronic disease state of COPD/ CHF.  Patient has had no recent inpatient hospital admissions.  Patient has history including, but not limited to, chronic respiratory failure; CHF; COPD on home O2 with ongoing tobacco use; pulmonary HTN; and anxiety.  HIPAA/ identity verified.  Patient reports "doing fine" and he denies pain, new/ recent falls, and sounds to be in no distress throughout entirety of call today.  Patient further reports:  -- attended recent PCP office visit 06/13/2019 where it was confirmed that patient was in COPD exacerbation; patient was prescribed short-term course of prednisone and antibiotics; patient confirms that he obtained and took medications as prescribed and is feeling better.  Patient denies ongoing symptoms yellow COPD zone, although he continues to admit to activity intolerance as his baseline.    -- denies concerns/ questions/ issues around medications; [facility] on medication management  -- refused flu shot at time of PCP office visit, stating that he usually gets a flu shot, but due to political considerations and personal beliefs has decided not to get this year; states he "doesn't trust" that the flu shot is safe and "no one is going to change" his mind.    -- upcoming appointment for CT scan of chest and abdomen (hernia follow up); reports plans to attend and confirms that his daughter will contnue providing transportation; continues to deny transportation needs.  All upcoming provider appointments reviewed with patient  -- [facility] RN CM and  confirms that he continues participating in [facility]; has tablet for daily weight, SaO2 and BP monitoring.   States that he discussed possibility of obtaining new home oxygen delivery system with his [facility] RN CM, and that the [facility] RN CM is "looking into it."  Encouraged patient to maintain contact with [facility] CM around his benefits and patient agrees to do so; explained that I would continue to follow for now, and discussed eventual THN CM case closure, as he is currently active with [facility] Chronic CM program; patient verbalizes understanding  -- has spoken to [facility] BSW around community resources for new housing and general resources; has not yet received information mailed to him by [facility]- encouraged patient to review promptly and maintain engagement with [facility] B[facility] around resources available to him  Self-health management of chronic disease states of COPD/ CHF: -- continues using home O2 at 2 L/min through "[employer];" reports that this portable O2 system continues to be difficult to transport due to short battery life; hopeful that [facility] RN CM will be able to provide update on insurance benefit to replace his current home O2 delivery system -- continues to smoke "a few cigarettes" each day; reports that he is currently smoking "about 2-3 cigarettes" per day; previously provided education/ support around quitting smoking reinforced; continues to deny that he smokes near his home O2 delivery system  -- continues monitoring and recording daily weights, SaO2, and BP readings, which automatically goes to his insurance company through the tablet provided to him by [employer] company ---- reports daily weight ranges between 224-226 lbs; SaO2 levels between 88-93% (goal); and BP readings that he states "are  good." ---- reviewed signs/ symptoms yellow COPD zone, along with corresponding action plan, and patient remains able to accurately verbalize same with minimal prompting  Patient denies further issues,  concerns, or problems today. I confirmed that patient hasmy direct phone number, the main [facility] CM office phone number, and the [facility] CM 24-hour nurse advice phone number should issues arise prior to next scheduled [facility] CM outreach, which we scheduled today for next month.  Encouraged patient to contact me directly if needs, questions, issues, or concerns arise prior to next scheduled outreach; patient agreed to do so.   Plan:  Patient will take medications as prescribed and will attend all scheduled provider appointments  Patient will promptly notify care providers for any new concerns/ issues/ problems that arise  Patient will continue monitoring/ recording daily weights and will maintain communication with  insurance company RN CM that is currently involved in his care to explore options/ benefits around obtaining new home O2 delivery system  Patient will maintain communication with [facility] and [facility] CSW teams currently involved in his care  I will send today's [facility] CM notes and care plan to patient's PCP as [facility] CM initial assessment  I will place printed educational material in mail to patient around quitting smoking and self-health management of COPD and patient will review  [facility] Community CM outreach to continue with scheduled phone call next month  [facility] CM Care Plan Problem One     Most Recent Value  Care Plan Problem One  Need for ongoing reinforcement of community resources in patient who lives alone with limited family support, as evidenced by patient reporting  Role Documenting the Problem One  Care Management Coordinator  Care Plan for Problem One  Active  THN Long Term Goal   Over the next 45 days, patient will verbalize options for new housing resources and for community resources available to him, as evidenced by patient reporting and [facility] CSW collaboration as indicated during [facility] RN CM outreach  [facility] Long Term Goal Start Date  06/10/19  Interventions for Problem  One Long Term Goal  Confirmed that patient has spoken with [facility] BSW around housing and community resources available to him- encouraged his ongoing engagement with [facility] CSW team    [facility] CM Care Plan Problem Two     Most Recent Value  Care Plan Problem Two  Need for ongoing reinforcement of self-health management of multiple chronic disease conditions, including COPD and CHF in patient who continues to smoke, as evidenced by patient reporting  Role Documenting the Problem Two  Care Management Coordinator  Care Plan for Problem Two  Active  Interventions for Problem Two Long Term Goal   Discussed with patient his current clinical condition and recent PCP office visit,  discussed his refusal to take flu shot,  discussed action plan for yellow CHF/ COPD zones along with corresponding action plan for same  [facility] Long Term Goal  Over the next 31 days, patient will verbalize accurate understanding of daily self-health management strategies/ plan for COPD and CHF, as evidenced by patient reporting during [facility] RN CM outreach  [facility] Long Term Goal Start Date  06/18/19 [Goal extended due to COPD exacerbation]  THN CM Short Term Goal #1   Over the next 30 days, patient will take medications as prescribed, as evidenced by patient reporting and collaboration with [facility] Pharmacist during [facility] RN CM Outreach  [facility] CM Short Term Goal #1 Start Date  06/18/19 [Goal extended another 30 days]  Interventions for Short Term Goal #2   Confirmed that patient obtained and has/ is taken newly prescribed medications for COPD exacerbation, prescribed at time of recent PCP office visit,  confirmed that patient continues self-managing medications and that he denies current medication concerns  THN CM Short Term Goal #2   Over the next 30 days, patient will continue working with his health insurance plan/ RN CM and verbalize process to obtain new home oxygen system, as evidenced by patient reporting and collaboration with patient's insurance  provider as indicated during [facility] RN CM Outreach [Goal modified and extended today]  [facility] CM Short Term Goal #2 Start Date  06/18/19  Interventions for Short Term Goal #2  Placed previous care coordination outreach to patient's insurance RN CM, currently involved in patient's care,  confirmed that [facility] RN CM has spoken to patient in follow up on need for new home monitoring oxygen system,  encouraged patient to continue communicating with [facility] RN CM around this stated need     Oneta Rack, RN, BSN, [employer] Coordinator [facility] Care Management  [phone]

## 2019-06-19 DIAGNOSIS — J449 Chronic obstructive pulmonary disease, unspecified: Secondary | ICD-10-CM | POA: Diagnosis not present

## 2019-06-19 DIAGNOSIS — J471 Bronchiectasis with (acute) exacerbation: Secondary | ICD-10-CM | POA: Diagnosis not present

## 2019-06-19 NOTE — Progress Notes (Signed)
Will CC pulm

## 2019-06-20 ENCOUNTER — Other Ambulatory Visit: Payer: Self-pay | Admitting: Internal Medicine

## 2019-06-20 ENCOUNTER — Telehealth: Payer: Self-pay | Admitting: Internal Medicine

## 2019-06-20 DIAGNOSIS — J441 Chronic obstructive pulmonary disease with (acute) exacerbation: Secondary | ICD-10-CM

## 2019-06-20 MED ORDER — AMOXICILLIN-POT CLAVULANATE 875-125 MG PO TABS: 1.0000 | ORAL_TABLET | Freq: Two times a day (BID) | ORAL | 0 refills | Status: DC

## 2019-06-20 NOTE — Telephone Encounter (Signed)
Patient does want 5 more days. hes does not wasn't to stay on it. He doesn't want C-diff

## 2019-06-20 NOTE — Progress Notes (Signed)
Looks like I am inheriting him, will make sure he has upcoming appt.

## 2019-06-20 NOTE — Telephone Encounter (Signed)
Patient states he was under the impression he was going to be taken amoxicillin-clavulanate (AUGMENTIN) 875-125 MG tablet for 10 days instead of 5 days, patient would like clarity and states he is not a 100 percent and symptoms have not improved, patient would like a follow up call today, please advise  [place], [place] [phone] (Phone) [phone] (Fax)

## 2019-06-20 NOTE — Telephone Encounter (Signed)
Sent Augmentin x 5 days bid   TMS

## 2019-06-20 NOTE — Telephone Encounter (Signed)
He had already had for 5 days does he want more Augmentin?  5=5+ 10  He cant stay on this forever can become resistant to Abx and get infectious diarrhea

## 2019-06-23 ENCOUNTER — Telehealth: Payer: Self-pay

## 2019-06-23 ENCOUNTER — Other Ambulatory Visit: Payer: Self-pay | Admitting: Internal Medicine

## 2019-06-23 DIAGNOSIS — K469 Unspecified abdominal hernia without obstruction or gangrene: Secondary | ICD-10-CM

## 2019-06-23 DIAGNOSIS — R140 Abdominal distension (gaseous): Secondary | ICD-10-CM

## 2019-06-23 NOTE — Telephone Encounter (Signed)
Copied from [place] [phone]. Topic: General - Other >> Jun 23, 2019  8:59 AM Ivar Drape wrote: Reason for CRM: [facility] w/[facility] [phone] or [phone] would like to talk to someone about the order for the patient's Ultrasound.

## 2019-06-24 ENCOUNTER — Ambulatory Visit
Admission: RE | Admit: 2019-06-24 | Discharge: 2019-06-24 | Disposition: A | Discharge status: Home / Self Care | Payer: Medicare Other | Source: Ambulatory Visit | Attending: Internal Medicine | Admitting: Internal Medicine

## 2019-06-24 ENCOUNTER — Telehealth: Payer: Self-pay | Admitting: Internal Medicine

## 2019-06-24 ENCOUNTER — Other Ambulatory Visit: Payer: Self-pay

## 2019-06-24 DIAGNOSIS — J441 Chronic obstructive pulmonary disease with (acute) exacerbation: Secondary | ICD-10-CM | POA: Insufficient documentation

## 2019-06-24 DIAGNOSIS — J439 Emphysema, unspecified: Secondary | ICD-10-CM | POA: Diagnosis not present

## 2019-06-24 DIAGNOSIS — K469 Unspecified abdominal hernia without obstruction or gangrene: Secondary | ICD-10-CM | POA: Diagnosis not present

## 2019-06-24 DIAGNOSIS — R911 Solitary pulmonary nodule: Secondary | ICD-10-CM | POA: Insufficient documentation

## 2019-06-24 DIAGNOSIS — R140 Abdominal distension (gaseous): Secondary | ICD-10-CM

## 2019-06-24 NOTE — Telephone Encounter (Signed)
Attempted to call pt back to give him his results from  CT of chest and abd. No answer, left message for patient to call back.

## 2019-06-24 NOTE — Telephone Encounter (Signed)
Patient returning call for ultrasound results. Nurse triage currently unavailable. Please advise.    Copied from [place] [phone]. Topic: Quick Communication - Lab Results (Clinic Use ONLY) >> Jun 24, 2019  4:46 PM Babs Bertin, CMA wrote: Called patient to inform them of 6OCT2020 lab results. When patient returns call, triage nurse may disclose results.

## 2019-06-24 NOTE — Telephone Encounter (Signed)
Spoken to [facility] and also gave message to Dr Olivia Mackie order has been placed.

## 2019-06-25 ENCOUNTER — Other Ambulatory Visit: Payer: Self-pay | Admitting: Internal Medicine

## 2019-06-25 DIAGNOSIS — K429 Umbilical hernia without obstruction or gangrene: Secondary | ICD-10-CM

## 2019-06-26 ENCOUNTER — Other Ambulatory Visit: Payer: Self-pay

## 2019-06-26 NOTE — Patient Outreach (Signed)
[facility]) Care Management  06/26/2019  Dustin [name] 04/21/1956 [id number]   Successful follow up call to patient today regarding social work referral for housing resources.  Patient confirmed receipt of [email or web address] listing and [email or web address] listing that were mailed on 06/12/19, however he states that he has not reviewed properties yet.   Closing social work case but did encourage him to call if additional needs arise.  Ronn Melena, BSW Social Worker [phone]

## 2019-06-27 ENCOUNTER — Ambulatory Visit: Payer: Self-pay | Admitting: Pharmacist

## 2019-06-27 DIAGNOSIS — J439 Emphysema, unspecified: Secondary | ICD-10-CM

## 2019-06-27 NOTE — Chronic Care Management (AMB) (Signed)
Chronic Care Management   Follow Up Note   06/27/2019 Name: TREGAN READ MRN: [id number] DOB: 1956-01-31  Referred by: McLean-Scocuzza, Nino Glow, MD Reason for referral : Chronic Care Management (Medication Management)   Dustin [name] is a 63 y.o. year old male who is a primary care patient of McLean-Scocuzza, Nino Glow, MD. The CCM team was consulted for assistance with chronic disease management and care coordination needs.    Received call from patient today.  Review of patient status, including review of consultants reports, relevant laboratory and other test results, and collaboration with appropriate care team members and the patient's provider was performed as part of comprehensive patient evaluation and provision of chronic care management services.    SDOH (Social Determinants of Health) screening performed today: Financial Strain . See Care Plan for related entries.   Advanced Directives Status: N See Care Plan and Vynca application for related entries.  Outpatient Encounter Medications as of 06/27/2019  Medication Sig Note  . Acetylcysteine (NAC PO) Take 1 tablet by mouth daily.   Marland Kitchen ALPRAZolam (XANAX) 1 MG tablet Take 1 tablet (1 mg total) by mouth 3 (three) times daily as needed for anxiety.   . AMBULATORY NON FORMULARY MEDICATION 1 each by Does not apply route as needed. Please provide patient with "So Clean" CPAP cleaning device to be used as needed.   Marland Kitchen amoxicillin-clavulanate (AUGMENTIN) 875-125 MG tablet Take 1 tablet by mouth 2 (two) times daily. With food   . antiseptic oral rinse (BIOTENE) LIQD 15 mLs by Mouth Rinse route daily as needed for dry mouth.   Marland Kitchen apixaban (ELIQUIS) 5 MG TABS tablet Take 1 tablet (5 mg total) by mouth 2 (two) times daily.   Marland Kitchen azithromycin (ZITHROMAX) 250 MG tablet Take 250 mg by mouth every other day.   . b complex vitamins capsule Take 1 capsule by mouth daily.   . carbamide peroxide (DEBROX) 6.5 % OTIC solution Place 5 drops into both  ears 2 (two) times daily. Prn x 4-7 days   . Cholecalciferol (VITAMIN D) 2000 units tablet Take 2,000 Units by mouth 2 (two) times a day.    . clobetasol (TEMOVATE) 0.05 % external solution Apply 1 application topically 2 (two) times daily. Prn to scalp and ears   . diltiazem (CARDIZEM) 30 MG tablet Take 1 tablet (30 mg total) by mouth 3 (three) times daily as needed.   Marland Kitchen escitalopram (LEXAPRO) 20 MG tablet Take 1 tablet (20 mg total) by mouth at bedtime.   . fluticasone (FLONASE) 50 MCG/ACT nasal spray Place 2 sprays into both nostrils daily as needed for allergies or rhinitis.   . furosemide (LASIX) 40 MG tablet 40 daily In am alternate the next day 40 mg bid (take 2nd dose in the early afternoon)   . gabapentin (NEURONTIN) 300 MG capsule TAKE 1 TO 2 CAPSULES BY MOUTH THREE TIMES DAILY   . hydrocortisone 2.5 % cream Apply topically 2 (two) times daily as needed. face   . ipratropium-albuterol (DUONEB) 0.5-2.5 (3) MG/3ML SOLN Take 3 mLs by nebulization every 6 (six) hours as needed.   Marland Kitchen ketoconazole (NIZORAL) 2 % shampoo Apply 1 application topically 2 (two) times a week. Let stand for 5 minutes   . levofloxacin (LEVAQUIN) 500 MG tablet Take 1 tablet (500 mg total) by mouth daily.   . magnesium oxide (MAG-OX) 400 MG tablet Take 400 mg by mouth daily.   . mometasone-formoterol (DULERA) 200-5 MCG/ACT AERO Inhale 2 puffs into the lungs  2 (two) times daily.   . mupirocin ointment (BACTROBAN) 2 % Apply 1 application topically 2 (two) times daily. Use left upper arm  And left ear x 1-2 weeks   . nystatin (MYCOSTATIN) 100000 UNIT/ML suspension Use as directed 5 mLs (500,000 Units total) in the mouth or throat 4 (four) times daily. Swish and spit 3 or 4 times daily for at least 5 days.  Then may be used as needed thereafter for thrush   . potassium chloride (K-DUR) 10 MEQ tablet Take 1-2 tablets (10-20 mEq total) by mouth daily. Repeat if second LASIX taken   . predniSONE (DELTASONE) 20 MG tablet Take 2  tablets (40 mg total) by mouth daily with breakfast. In am x 5 days   . Probiotic Product (PROBIOTIC PO) Take 1 capsule by mouth 2 (two) times a day.    Marland Kitchen rOPINIRole (REQUIP) 0.25 MG tablet Take 1 tablet (0.25 mg total) by mouth 2 (two) times daily.   . sodium chloride (OCEAN) 0.65 % SOLN nasal spray Place 1-2 sprays into both nostrils as needed for congestion.   Marland Kitchen Spacer/Aero-Holding Chambers (OPTICHAMBER ADVANTAGE-LG MASK) MISC Use as directed with inhaler diag  j44.1   . theophylline (UNIPHYL) 400 MG 24 hr tablet Take 1 tablet (400 mg total) by mouth every morning.   . Tiotropium Bromide Monohydrate (SPIRIVA RESPIMAT) 2.5 MCG/ACT AERS Inhale 2 puffs into the lungs daily.   . traZODone (DESYREL) 50 MG tablet Take 1-2 tablets (50-100 mg total) by mouth at bedtime as needed for sleep. 04/10/2019: PRN   No facility-administered encounter medications on file as of 06/27/2019.      Goals Addressed            This Visit's Progress     Patient Stated   . "I want to stay well" (pt-stated)       Current Barriers:  . Polypharmacy - patient with COPD, CHF, Afib, anxiety . Financial concerns- resolved; patient is receiving Dulera (Merck), Spiriva (BI), and Eliquis (BMS) patient assistance through 09/18/2019. o Contacted me asking for the refill phone number for [employer] for [employer] and Proventil . COPD; follows with Dr. Sung Amabile, though has not been seen since 10/2018, managed on Dulera + Spiriva, azithromycin 250 QOD, theophylline 400 mg QAM, Duonebs PRN.  Marland Kitchen HFrEF, follows with Dr. Mariah Milling  Pharmacist Clinical Goal(s):  Marland Kitchen Over the next 90 days, patient will work with PharmD, primary care provider, and specialty providers to address needs related to optimized medication management  Interventions: . Contacted patient. Provided [employer] phone number: [phone]. Patient expressed appreciation.  Patient Self Care Activities:  . Self administers medications as prescribed  Please see past  updates related to this goal by clicking on the "Past Updates" button in the selected goal         Plan: - Will outreach patient in the next 3 weeks as scheduled  Catie Feliz Beam, PharmD, CPP Clinical Pharmacist [facility] [employer] [phone]

## 2019-06-27 NOTE — Patient Instructions (Signed)
Visit Information  Goals Addressed            This Visit's Progress     Patient Stated   . "I want to stay well" (pt-stated)       Current Barriers:  . Polypharmacy - patient with COPD, CHF, Afib, anxiety . Financial concerns- resolved; patient is receiving Dulera (Merck), Spiriva (BI), and Eliquis (BMS) patient assistance through 09/18/2019. o Contacted me asking for the refill phone number for [employer] for [employer] and Proventil . COPD; follows with Dr. [name], though has not been seen since 10/2018, managed on Dulera + Spiriva, azithromycin 250 QOD, theophylline 400 mg QAM, Duonebs PRN.  Marland Kitchen HFrEF, follows with Dr. Rockey Situ  Pharmacist Clinical Goal(s):  Marland Kitchen Over the next 90 days, patient will work with PharmD, primary care provider, and specialty providers to address needs related to optimized medication management  Interventions: . Contacted patient. Provided [employer] phone number: [phone]. Patient expressed appreciation.  Patient Self Care Activities:  . Self administers medications as prescribed  Please see past updates related to this goal by clicking on the "Past Updates" button in the selected goal         The patient verbalized understanding of instructions provided today and declined a print copy of patient instruction materials.   Plan: - Will outreach patient in the next 3 weeks as scheduled  Catie Darnelle Maffucci, PharmD, [employer] Pharmacist [place] [phone]

## 2019-06-28 NOTE — Progress Notes (Signed)
Can we call to check on him Has diastolic CHF I think he is supposed to be taking lasix 40 BID alternating with lasix 40 daily

## 2019-06-30 ENCOUNTER — Telehealth: Payer: Self-pay | Admitting: *Deleted

## 2019-06-30 NOTE — Telephone Encounter (Signed)
Author: Antonieta Iba, MD Service: Cardiology Author Type: Physician  Filed: 06/28/2019 12:44 PM Encounter Date: 06/13/2019 Status: Signed  Editor: Antonieta Iba, MD (Physician)     Show:Clear all [x] Manual[] Template[] Copied  Added by: [x] , MD  [] Hover for details Can we call to check on him Has diastolic CHF I think he is supposed to be taking lasix 40 BID alternating with lasix 40 daily      See above. Dr wanted Antonieta Iba to check on patient. Patient has completed the lasix 40 mg BID x5 days from seeing PCP around 9/25. Patient is now taking the loasix 40 mg BID alternating days with 40 mg daily. He is due for 3 months follow up. Scheduled him with virtual visit for Dr Mariah Milling on 07/02/19. Consent obtained.         Virtual Visit Pre-Appointment Phone Call  "(Name), I am calling you today to discuss your upcoming appointment. We are currently trying to limit exposure to the virus that causes COVID-19 by seeing patients at home rather than in the office."  1. "What is the BEST phone number to call the day of the visit?" - include this in appointment notes  2. "Do you have or have access to (through a family member/friend) a smartphone with video capability that we can use for your visit?" a. telephone  3. Confirm consent - "In the setting of the current Covid19 crisis, you are scheduled for a (phone or video) visit with your provider on (date) at (time).  Just as we do with many in-office visits, in order for you to participate in this visit, we must obtain consent.  If you'd like, I can send this to your mychart (if signed up) or email for you to review.  Otherwise, I can obtain your verbal consent now.  All virtual visits are billed to your insurance company just like a normal visit would be.  By agreeing to a virtual visit, we'd like you to understand that the technology does not allow for your provider to perform an examination, and thus may limit your  provider's ability to fully assess your condition. If your provider identifies any concerns that need to be evaluated in person, we will make arrangements to do so.  Finally, though the technology is pretty good, we cannot assure that it will always work on either your or our end, and in the setting of a video visit, we may have to convert it to a phone-only visit.  In either situation, we cannot ensure that we have a secure connection.  Are you willing to proceed?" STAFF: Did the patient verbally acknowledge consent to telehealth visit? Document YES/NO here: yes  4. Advise patient to be prepared - "Two hours prior to your appointment, go ahead and check your blood pressure, pulse, oxygen saturation, and your weight (if you have the equipment to check those) and write them all down. When your visit starts, your provider will ask you for this information. If you have an Apple Watch or Kardia device, please plan to have heart rate information ready on the day of your appointment. Please have a pen and paper handy nearby the day of the visit as well."  5. Give patient instructions for MyChart download to smartphone OR [email or web address].me as below if video visit (depending on what platform provider is using)  6. Inform patient they will receive a phone call 15 minutes prior to their appointment time (may be from unknown caller ID) so they should  be prepared to answer    TELEPHONE CALL NOTE  Dustin [name] has been deemed a candidate for a follow-up tele-health visit to limit community exposure during the Covid-19 pandemic. I spoke with the patient via phone to ensure availability of phone/video source, confirm preferred email & phone number, and discuss instructions and expectations.  I reminded Dustin [name] to be prepared with any vital sign and/or heart rhythm information that could potentially be obtained via home monitoring, at the time of his visit. I reminded Dustin [name] to expect a phone call  prior to his visit.  Roney Jaffe, RN 06/30/2019 8:58 AM

## 2019-06-30 NOTE — Telephone Encounter (Signed)
-----   Message from Minna Merritts, MD sent at 06/28/2019 12:44 PM EDT -----   ----- Message ----- From: McLean-Scocuzza, Nino Glow, MD Sent: 06/13/2019  10:17 AM EDT To: Minna Merritts, MD  Dr. [name] still having COPD exacerbation FYI Dr. Rockey Situ leg edema and wt gain increased lasix 40 mg bid x 5 days not sure if this needs to be bid long term ThanksTMS

## 2019-07-02 ENCOUNTER — Telehealth (INDEPENDENT_AMBULATORY_CARE_PROVIDER_SITE_OTHER): Payer: Medicare Other | Admitting: Cardiovascular Disease

## 2019-07-02 VITALS — BP 103/63 | HR 100 | Ht 70.0 in | Wt 225.0 lb

## 2019-07-02 DIAGNOSIS — J441 Chronic obstructive pulmonary disease with (acute) exacerbation: Secondary | ICD-10-CM

## 2019-07-02 DIAGNOSIS — F17200 Nicotine dependence, unspecified, uncomplicated: Secondary | ICD-10-CM

## 2019-07-02 DIAGNOSIS — I272 Pulmonary hypertension, unspecified: Secondary | ICD-10-CM | POA: Diagnosis not present

## 2019-07-02 DIAGNOSIS — Z79899 Other long term (current) drug therapy: Secondary | ICD-10-CM

## 2019-07-02 DIAGNOSIS — E876 Hypokalemia: Secondary | ICD-10-CM

## 2019-07-02 DIAGNOSIS — R600 Localized edema: Secondary | ICD-10-CM

## 2019-07-02 DIAGNOSIS — I5032 Chronic diastolic (congestive) heart failure: Secondary | ICD-10-CM | POA: Diagnosis not present

## 2019-07-02 MED ORDER — POTASSIUM CHLORIDE CRYS ER 10 MEQ PO TBCR: EXTENDED_RELEASE_TABLET | ORAL | 3 refills | Status: AC

## 2019-07-02 MED ORDER — FUROSEMIDE 40 MG PO TABS: ORAL_TABLET | ORAL | 3 refills | Status: AC

## 2019-07-02 NOTE — Patient Instructions (Addendum)
Medication Instructions:  - Your physician has recommended you make the following change in your medication:   1) Increase lasix (furosemide) to 40 mg- take 1 tablet by mouth twice a day  2) Increase Potassium 10 meq- take 4 tablets (40 meq) by mouth once daily   If you need a refill on your cardiac medications before your next appointment, please call your pharmacy.    Lab work: - Your physician recommends that you return for lab work in: 1 month- BMP (around 07/30/2019)   - please come to the [place] [facility], 1st desk on the right to check in - lab is open Monday-Friday (7:30 am- 5:30 pm)  - no appointment needed  If you have labs (blood work) drawn today and your tests are completely normal, you will receive your results only by: Marland Kitchen MyChart Message (if you have MyChart) OR . A paper copy in the mail If you have any lab test that is abnormal or we need to change your treatment, we will call you to review the results.   Testing/Procedures: No new testing needed   Follow-Up: At [facility], you and your health needs are our priority.  As part of our continuing mission to provide you with exceptional heart care, we have created designated Provider Care Teams.  These Care Teams include your primary Cardiologist (physician) and Advanced Practice Providers (APPs -  Physician Assistants and Nurse Practitioners) who all work together to provide you with the care you need, when you need it.  . You will need a follow up appointment in 6 months (April 2021) .   Please call our office 2 months in advance to schedule this appointment.  (Call in early February 2021 to schedule)   . Providers on your designated Care Team:   . Murray Hodgkins, NP . Christell Faith, PA-C . Marrianne Mood, PA-C  Any Other Special Instructions Will Be Listed Below (If Applicable).  For educational health videos Log in to : [email or web address] Or : [email or web address], password : [email or web address]

## 2019-07-02 NOTE — Progress Notes (Signed)
Virtual Visit via Video Note   This visit type was conducted due to national recommendations for restrictions regarding the COVID-19 Pandemic (e.g. social distancing) in an effort to limit this patient's exposure and mitigate transmission in our community.  Due to his co-morbid illnesses, this patient is at least at moderate risk for complications without adequate follow up.  This format is felt to be most appropriate for this patient at this time.  All issues noted in this document were discussed and addressed.  A limited physical exam was performed with this format.  Please refer to the patient's chart for his consent to telehealth for [facility].   I connected with  Dustin [name] on 07/02/19 by a video enabled telemedicine application and verified that I am speaking with the correct person using two identifiers. I discussed the limitations of evaluation and management by telemedicine. The patient expressed understanding and agreed to proceed.   Evaluation Performed:  Follow-up visit  Date:  07/02/2019   ID:  Dustin [name], DOB 1956-08-09, MRN [id number]  Patient Location:  [address] [place], [place] [address] [facility]    Provider location:   [facility], [place] office  PCP:  McLean-Scocuzza, Pasty Spillers, MD  Cardiologist:  Fonnie Mu   Chief Complaint  Patient presents with  . Other    3 month follow up. patient c/o swelling in feet and legs. meds reviewed verbally with patient.      History of Present Illness:    Dustin [name] is a 63 y.o. male who presents via audio/video conferencing for a telehealth visit today.   The patient does not symptoms concerning for COVID-19 infection (fever, chills, cough, or new SHORTNESS OF BREATH).   Patient has a past medical history of smoking at least 30 years, quit September 2017, emphysema,  chronic diastolic CHF,  Obstructive sleep apnea, Wears CPAP Stress test 05/2016: no ischemia On  CT scan, minimial if any CAD, PAD. Minimal aortic arch atherosclerosis DVT 02/2018, small PE who presents to the office for follow-up of his shortness of breath, COPD Exacerbation  Received seen by primary care, Lasix up to 40 twice daily for 5 days then back down to Lasix 40 twice daily alternating with 40 daily  On prior clinic visit legs were heavy, had a recent fall, lots of coughing consistent with bronchitis, lots of green sputum  Weight on last clinic visit at home 230.7 Prior weight 218  He had missed several days of Lasix for a wedding And got back on the Lasix with improved leg swelling  Chronic oxygen supplemental typically 2 L  On todays visit, feet swollen, same as last 6 to 8 months Weight 222 to 226,  Foot swelling  AVG 110-120s systolic  Testing discussed with him in detail CT chest Stable severe emphysematous changes and pulmonary scarring without acute overlying pulmonary process.  U/s umbilical hernia containing peritoneal fat.   Other past medical history reviewed In the hospital 01/2018 with COPD exacerbation  Seen in the emergency room 02/26/2018 for leg swelling swelling in bilateral legs right greater than left and he had an outpatient ultrasound which was positive for DVT.  small pulmonary embolism   Admission 01/2017 COPD exacerbation, PNA One week in hospital  CT scan chest /ABD reviewed with him in the office today:  Images pulled up minimial if any CAD, PAD. Minimal aortic arch atherosclerosis  Hospital admission June 22 2016  discharge October 10 for COPD  exacerbation with hypoxia. Notes indicating no significant CHF Discharge with antibiotics, prednisone taper   Prior CV studies:   The following studies were reviewed today:    Past Medical History:  Diagnosis Date  . Allergy   . Anxiety   . Asthma   . Chronic diastolic CHF (congestive heart failure) (HCC)    a. echo 07/2013: EF 60-65%, DD, biatrial dilatation, Ao  sclerosis, dilated RV, moderate pulmonary HTN, elevated CV and RA pressures; b. patient reported echo at Dr. Milta Deiters office 02/2015 - his office does not have record of him being a pt there c. echo 11/2015: EF 60-65%, Grade 1 DD, mod-severe pulm pressures  . Chronic respiratory failure (HCC)    a. on 2L via nasal cannula; b. secondary to COPD  . COPD (chronic obstructive pulmonary disease) (HCC)    on 2L continuous   . Depression   . Emphysema of lung (HCC)   . GERD (gastroesophageal reflux disease)   . HCAP (healthcare-associated pneumonia)    01/22/18-01/28/18   . Headache   . Hypertension   . Leg DVT (deep venous thromboembolism), acute, bilateral (HCC)    02/26/18  . Personal history of tobacco use, presenting hazards to health 08/17/2015  . Pulmonary embolism (HCC)    02/26/18  . Pulmonary HTN (HCC)   . Tobacco abuse    Past Surgical History:  Procedure Laterality Date  . ABDOMINAL SURGERY    . ADENOIDECTOMY    . CARPAL TUNNEL RELEASE Right   . corpal tunnel Right   . ELBOW SURGERY Right    repaired tendon  . hemmorhoid N/A   . HEMORRHOID SURGERY    . NASAL SINUS SURGERY    . NASAL SINUS SURGERY     1970s  . NOSE SURGERY    . reflux surgery     1994  . ROTATOR CUFF REPAIR Right   . SHOULDER SURGERY    . TENNIS ELBOW RELEASE/NIRSCHEL PROCEDURE Right   . TONSILLECTOMY    . URETHRA SURGERY     surgery 6 times from age [age] yrs old  . URETHRA SURGERY        Allergies:   Asa [aspirin], Bee venom, Codeine, Ibuprofen, and Iodinated diagnostic agents   Social History   Tobacco Use  . Smoking status: Former Smoker    Packs/day: 2.00    Years: 40.00    Pack years: 80.00    Types: Cigarettes    Quit date: 01/14/2018    Years since quitting: 1.4  . Smokeless tobacco: Never Used  Substance Use Topics  . Alcohol use: No  . Drug use: No     Current Outpatient Medications on File Prior to Visit  Medication Sig Dispense Refill  . Acetylcysteine (NAC PO) Take 1 tablet by  mouth daily.    Marland Kitchen ALPRAZolam (XANAX) 1 MG tablet Take 1 tablet (1 mg total) by mouth 3 (three) times daily as needed for anxiety. 15 tablet 0  . AMBULATORY NON FORMULARY MEDICATION 1 each by Does not apply route as needed. Please provide patient with "So Clean" CPAP cleaning device to be used as needed. 1 Device 0  . antiseptic oral rinse (BIOTENE) LIQD 15 mLs by Mouth Rinse route daily as needed for dry mouth. 473 mL 12  . apixaban (ELIQUIS) 5 MG TABS tablet Take 1 tablet (5 mg total) by mouth 2 (two) times daily. 180 tablet 3  . azithromycin (ZITHROMAX) 250 MG tablet Take 250 mg by mouth every other day.    Marland Kitchen  b complex vitamins capsule Take 1 capsule by mouth daily.    . carbamide peroxide (DEBROX) 6.5 % OTIC solution Place 5 drops into both ears 2 (two) times daily. Prn x 4-7 days 15 mL 11  . Cholecalciferol (VITAMIN D) 2000 units tablet Take 2,000 Units by mouth 2 (two) times a day.     . clobetasol (TEMOVATE) 0.05 % external solution Apply 1 application topically 2 (two) times daily. Prn to scalp and ears 50 mL 11  . diltiazem (CARDIZEM) 30 MG tablet Take 1 tablet (30 mg total) by mouth 3 (three) times daily as needed. 180 tablet 3  . escitalopram (LEXAPRO) 20 MG tablet Take 1 tablet (20 mg total) by mouth at bedtime. 90 tablet 3  . fluticasone (FLONASE) 50 MCG/ACT nasal spray Place 2 sprays into both nostrils daily as needed for allergies or rhinitis. 16 g 11  . furosemide (LASIX) 40 MG tablet 40 daily In am alternate the next day 40 mg bid (take 2nd dose in the early afternoon) 180 tablet 3  . gabapentin (NEURONTIN) 300 MG capsule TAKE 1 TO 2 CAPSULES BY MOUTH THREE TIMES DAILY 520 capsule 11  . hydrocortisone 2.5 % cream Apply topically 2 (two) times daily as needed. face 30 g 11  . ipratropium-albuterol (DUONEB) 0.5-2.5 (3) MG/3ML SOLN Take 3 mLs by nebulization every 6 (six) hours as needed. 1080 mL 3  . ketoconazole (NIZORAL) 2 % shampoo Apply 1 application topically 2 (two) times a week.  Let stand for 5 minutes 120 mL 3  . magnesium oxide (MAG-OX) 400 MG tablet Take 400 mg by mouth daily.    . mometasone-formoterol (DULERA) 200-5 MCG/ACT AERO Inhale 2 puffs into the lungs 2 (two) times daily. 1 Inhaler 4  . mupirocin ointment (BACTROBAN) 2 % Apply 1 application topically 2 (two) times daily. Use left upper arm  And left ear x 1-2 weeks 30 g 2  . nystatin (MYCOSTATIN) 100000 UNIT/ML suspension Use as directed 5 mLs (500,000 Units total) in the mouth or throat 4 (four) times daily. Swish and spit 3 or 4 times daily for at least 5 days.  Then may be used as needed thereafter for thrush 473 mL 0  . potassium chloride (K-DUR) 10 MEQ tablet Take 1-2 tablets (10-20 mEq total) by mouth daily. Repeat if second LASIX taken 180 tablet 3  . Probiotic Product (PROBIOTIC PO) Take 1 capsule by mouth 2 (two) times a day.     Marland Kitchen rOPINIRole (REQUIP) 0.25 MG tablet Take 1 tablet (0.25 mg total) by mouth 2 (two) times daily. 180 tablet 3  . sodium chloride (OCEAN) 0.65 % SOLN nasal spray Place 1-2 sprays into both nostrils as needed for congestion. 480 mL 12  . Spacer/Aero-Holding Chambers (OPTICHAMBER ADVANTAGE-LG MASK) MISC Use as directed with inhaler diag  j44.1 2 each 1  . theophylline (UNIPHYL) 400 MG 24 hr tablet Take 1 tablet (400 mg total) by mouth every morning. 90 tablet 3  . Tiotropium Bromide Monohydrate (SPIRIVA RESPIMAT) 2.5 MCG/ACT AERS Inhale 2 puffs into the lungs daily. 3 Inhaler 3  . traZODone (DESYREL) 50 MG tablet Take 1-2 tablets (50-100 mg total) by mouth at bedtime as needed for sleep. 60 tablet 11   No current facility-administered medications on file prior to visit.      Family Hx: The patient's family history includes Alcohol abuse in his daughter; Arthritis in his daughter, father, and mother; Asthma in his daughter and mother; Bipolar disorder in his mother; Birth  defects in his brother, paternal grandmother, and sister; CAD in his father; Depression in his daughter,  mother, and paternal grandmother; Drug abuse in his daughter; Hearing loss in his father and mother; Heart disease in his brother, father, mother, and paternal grandmother; Hyperlipidemia in his father and mother; Hypertension in his father and mother; Intellectual disability in his daughter; Kidney disease in his mother and son; Malignant hypertension in his mother; Miscarriages / Stillbirths in his daughter; Peripheral Artery Disease in his mother; Rheum arthritis in his mother; Stroke in his father. There is no history of Diabetes.  ROS:   Please see the history of present illness.    Review of Systems  Constitutional: Negative.   HENT: Negative.   Respiratory: Positive for shortness of breath.   Cardiovascular: Positive for leg swelling.  Gastrointestinal: Negative.   Musculoskeletal: Negative.   Neurological: Negative.   Psychiatric/Behavioral: Negative.   All other systems reviewed and are negative.    Labs/Other Tests and Data Reviewed:    Recent Labs: 12/06/2018: Magnesium 2.2 04/25/2019: ALT 14; B Natriuretic Peptide 70.0 05/23/2019: BUN 13; Creatinine, Ser 1.08; Hemoglobin 13.5; Platelets 173.0 Repeated and verified X2.; Potassium 4.1; Sodium 145; TSH 2.32   Recent Lipid Panel Lab Results  Component Value Date/Time   CHOL 121 05/23/2019 08:19 AM   CHOL 142 09/24/2013 10:27 AM   TRIG 55.0 05/23/2019 08:19 AM   TRIG 74 09/24/2013 10:27 AM   HDL 48.20 05/23/2019 08:19 AM   HDL 56 09/24/2013 10:27 AM   CHOLHDL 3 05/23/2019 08:19 AM   LDLCALC 62 05/23/2019 08:19 AM   LDLCALC 71 09/24/2013 10:27 AM    Wt Readings from Last 3 Encounters:  07/02/19 225 lb (102.1 kg)  06/13/19 228 lb (103.4 kg)  05/01/19 228 lb (103.4 kg)     Exam:    Vital Signs: Vital signs may also be detailed in the HPI BP 103/63   Pulse 100   Ht 5\' 10"  (1.778 m)   Wt 225 lb (102.1 kg)   BMI 32.28 kg/m   Wt Readings from Last 3 Encounters:  07/02/19 225 lb (102.1 kg)  06/13/19 228 lb (103.4 kg)   05/01/19 228 lb (103.4 kg)   Temp Readings from Last 3 Encounters:  04/25/19 99.8 F (37.7 C)  12/06/18 98.3 F (36.8 C) (Oral)  10/31/18 98.7 F (37.1 C) (Oral)   BP Readings from Last 3 Encounters:  07/02/19 103/63  04/25/19 128/89  04/01/19 120/76   Pulse Readings from Last 3 Encounters:  07/02/19 100  04/25/19 99  04/01/19 (!) 108     Well nourished, well developed male in no acute distress. Constitutional:  oriented to person, place, and time. No distress.  Head: Normocephalic and atraumatic.  Eyes:  no discharge. No scleral icterus.  Neck: Normal range of motion. Neck supple.  Pulmonary/Chest: No audible wheezing, no distress, appears comfortable Musculoskeletal: Normal range of motion.  no  tenderness or deformity.  Neurological:   Coordination normal. Full exam not performed Skin:  No rash Psychiatric:  normal mood and affect. behavior is normal. Thought content normal.    ASSESSMENT & PLAN:    Problem List Items Addressed This Visit      Cardiology Problems   CHF (congestive heart failure) (HCC)   Pulmonary hypertension (HCC)     Other   Tobacco use disorder    Other Visit Diagnoses    COPD exacerbation (HCC)    -  Primary     Essential hypertension - Plan:  EKG 12-Lead Blood pressure is well controlled on today's visit. No changes made to the medications. stable  SOB (shortness of breath) - Plan: EKG 12-Lead Severe COPD on chronic oxygen chronic bronchitis Recent tx with ABX  DVT bilateral leg Would likely require indefinite eliquis  twice a day Tolerating anticoagulation No bleeding  Chronic diastolic heart failure (HCC) Lasix up to 40 mg twice a day Potassium 40 meq daily BMP in one month  COPD exacerbation (HCC) Chronic issue followed by pulmonary  Tobacco use disorder Reports he no longer smokes  COVID-19 Education: The signs and symptoms of COVID-19 were discussed with the patient and how to seek care for testing (follow up  with PCP or arrange E-visit).  The importance of social distancing was discussed today.  Patient Risk:   After full review of this patients clinical status, I feel that they are at least moderate risk at this time.  Time:   Today, I have spent 25 minutes with the patient with telehealth technology discussing the cardiac and medical problems/diagnoses detailed above   Additional 10 min spent reviewing the chart prior to patient visit today   Medication Adjustments/Labs and Tests Ordered: Current medicines are reviewed at length with the patient today.  Concerns regarding medicines are outlined above.   Tests Ordered: No tests ordered   Medication Changes: No changes made   Disposition: Follow-up in 6 months   Signed, Julien Nordmannimothy Gollan, MD  [facility] [place] Office [address], [place], [name] [id number]

## 2019-07-04 ENCOUNTER — Telehealth: Payer: Self-pay | Admitting: Cardiovascular Disease

## 2019-07-04 DIAGNOSIS — Z131 Encounter for screening for diabetes mellitus: Secondary | ICD-10-CM

## 2019-07-04 NOTE — Telephone Encounter (Signed)
Patient wants Dustin [name] to add a1c to upcoming [facility]

## 2019-07-04 NOTE — Telephone Encounter (Signed)
Pts sister takes care of him and he would to get a HGBA1C since she worries about his BS he was told he was borderline Diabetic. Would like to have drawn at the time of his BMET.   Will forward to Dr. Rockey Situ.

## 2019-07-06 NOTE — Telephone Encounter (Signed)
We can add HBA1C if sister would like It will be normal given not a single glucose past year over 90

## 2019-07-07 ENCOUNTER — Emergency Department: Payer: Medicare Other

## 2019-07-07 ENCOUNTER — Ambulatory Visit (INDEPENDENT_AMBULATORY_CARE_PROVIDER_SITE_OTHER): Payer: Medicare Other | Admitting: Pulmonary Disease

## 2019-07-07 ENCOUNTER — Other Ambulatory Visit: Payer: Self-pay

## 2019-07-07 ENCOUNTER — Inpatient Hospital Stay
Admission: EM | Admit: 2019-07-07 | Discharge: 2019-07-10 | DRG: 190 | Disposition: A | Discharge status: Home / Self Care | Payer: Medicare Other | Attending: Internal Medicine | Admitting: Internal Medicine

## 2019-07-07 DIAGNOSIS — D72829 Elevated white blood cell count, unspecified: Secondary | ICD-10-CM | POA: Diagnosis present

## 2019-07-07 DIAGNOSIS — Z79899 Other long term (current) drug therapy: Secondary | ICD-10-CM

## 2019-07-07 DIAGNOSIS — Z885 Allergy status to narcotic agent status: Secondary | ICD-10-CM | POA: Diagnosis not present

## 2019-07-07 DIAGNOSIS — J441 Chronic obstructive pulmonary disease with (acute) exacerbation: Secondary | ICD-10-CM | POA: Diagnosis not present

## 2019-07-07 DIAGNOSIS — Z91041 Radiographic dye allergy status: Secondary | ICD-10-CM | POA: Diagnosis not present

## 2019-07-07 DIAGNOSIS — R739 Hyperglycemia, unspecified: Secondary | ICD-10-CM | POA: Diagnosis present

## 2019-07-07 DIAGNOSIS — K219 Gastro-esophageal reflux disease without esophagitis: Secondary | ICD-10-CM | POA: Diagnosis present

## 2019-07-07 DIAGNOSIS — Z9981 Dependence on supplemental oxygen: Secondary | ICD-10-CM

## 2019-07-07 DIAGNOSIS — Z8349 Family history of other endocrine, nutritional and metabolic diseases: Secondary | ICD-10-CM | POA: Diagnosis not present

## 2019-07-07 DIAGNOSIS — J449 Chronic obstructive pulmonary disease, unspecified: Secondary | ICD-10-CM

## 2019-07-07 DIAGNOSIS — Z8701 Personal history of pneumonia (recurrent): Secondary | ICD-10-CM

## 2019-07-07 DIAGNOSIS — Z86718 Personal history of other venous thrombosis and embolism: Secondary | ICD-10-CM

## 2019-07-07 DIAGNOSIS — Z841 Family history of disorders of kidney and ureter: Secondary | ICD-10-CM

## 2019-07-07 DIAGNOSIS — Z7901 Long term (current) use of anticoagulants: Secondary | ICD-10-CM

## 2019-07-07 DIAGNOSIS — D539 Nutritional anemia, unspecified: Secondary | ICD-10-CM | POA: Diagnosis present

## 2019-07-07 DIAGNOSIS — Z886 Allergy status to analgesic agent status: Secondary | ICD-10-CM | POA: Diagnosis not present

## 2019-07-07 DIAGNOSIS — T380X5A Adverse effect of glucocorticoids and synthetic analogues, initial encounter: Secondary | ICD-10-CM | POA: Diagnosis present

## 2019-07-07 DIAGNOSIS — I110 Hypertensive heart disease with heart failure: Secondary | ICD-10-CM | POA: Diagnosis present

## 2019-07-07 DIAGNOSIS — I5032 Chronic diastolic (congestive) heart failure: Secondary | ICD-10-CM | POA: Diagnosis present

## 2019-07-07 DIAGNOSIS — Z823 Family history of stroke: Secondary | ICD-10-CM

## 2019-07-07 DIAGNOSIS — J9621 Acute and chronic respiratory failure with hypoxia: Secondary | ICD-10-CM | POA: Diagnosis present

## 2019-07-07 DIAGNOSIS — Z825 Family history of asthma and other chronic lower respiratory diseases: Secondary | ICD-10-CM

## 2019-07-07 DIAGNOSIS — I5033 Acute on chronic diastolic (congestive) heart failure: Secondary | ICD-10-CM

## 2019-07-07 DIAGNOSIS — R0602 Shortness of breath: Secondary | ICD-10-CM | POA: Diagnosis not present

## 2019-07-07 DIAGNOSIS — Z20828 Contact with and (suspected) exposure to other viral communicable diseases: Secondary | ICD-10-CM | POA: Diagnosis present

## 2019-07-07 DIAGNOSIS — Z86711 Personal history of pulmonary embolism: Secondary | ICD-10-CM | POA: Diagnosis not present

## 2019-07-07 DIAGNOSIS — Z720 Tobacco use: Secondary | ICD-10-CM | POA: Diagnosis not present

## 2019-07-07 DIAGNOSIS — Z7951 Long term (current) use of inhaled steroids: Secondary | ICD-10-CM

## 2019-07-07 DIAGNOSIS — Z87891 Personal history of nicotine dependence: Secondary | ICD-10-CM | POA: Diagnosis not present

## 2019-07-07 DIAGNOSIS — Z8249 Family history of ischemic heart disease and other diseases of the circulatory system: Secondary | ICD-10-CM | POA: Diagnosis not present

## 2019-07-07 DIAGNOSIS — I272 Pulmonary hypertension, unspecified: Secondary | ICD-10-CM | POA: Diagnosis present

## 2019-07-07 DIAGNOSIS — I509 Heart failure, unspecified: Secondary | ICD-10-CM | POA: Diagnosis not present

## 2019-07-07 DIAGNOSIS — J9611 Chronic respiratory failure with hypoxia: Secondary | ICD-10-CM

## 2019-07-07 DIAGNOSIS — D72828 Other elevated white blood cell count: Secondary | ICD-10-CM | POA: Diagnosis present

## 2019-07-07 DIAGNOSIS — Z792 Long term (current) use of antibiotics: Secondary | ICD-10-CM

## 2019-07-07 LAB — BASIC METABOLIC PANEL
Anion gap: 8 (ref 5–15)
BUN: 17 mg/dL (ref 8–23)
CO2: 35 mmol/L — ABNORMAL HIGH (ref 22–32)
Calcium: 9.4 mg/dL (ref 8.9–10.3)
Chloride: 100 mmol/L (ref 98–111)
Creatinine, Ser: 1.1 mg/dL (ref 0.61–1.24)
GFR calc Af Amer: >60 mL/min (ref >60)
GFR calc non Af Amer: >60 mL/min (ref >60)
Glucose, Bld: 105 mg/dL — ABNORMAL HIGH (ref 70–99)
Potassium: 4.1 mmol/L (ref 3.5–5.1)
Sodium: 143 mmol/L (ref 135–145)

## 2019-07-07 LAB — CBC
HCT: 44.8 % (ref 39.0–52.0)
Hemoglobin: 13.8 g/dL (ref 13.0–17.0)
MCH: 32.1 pg (ref 26.0–34.0)
MCHC: 30.8 g/dL (ref 30.0–36.0)
MCV: 104.2 fL — ABNORMAL HIGH (ref 80.0–100.0)
Platelets: 143 10*3/uL — ABNORMAL LOW (ref 150–400)
RBC: 4.3 MIL/uL (ref 4.22–5.81)
RDW: 13.4 % (ref 11.5–15.5)
WBC: 12.7 10*3/uL — ABNORMAL HIGH (ref 4.0–10.5)
nRBC: 0 % (ref 0.0–0.2)

## 2019-07-07 LAB — SARS CORONAVIRUS 2 (TAT 6-24 HRS): SARS Coronavirus 2: NEGATIVE

## 2019-07-07 MED ORDER — SODIUM CHLORIDE 0.9% FLUSH
3.0000 mL | Freq: Two times a day (BID) | INTRAVENOUS | Status: DC
  Administered 2019-07-07 – 2019-07-10 (×6): 3 mL via INTRAVENOUS

## 2019-07-07 MED ORDER — VITAMIN D 25 MCG (1000 UNIT) PO TABS
2000.0000 [IU] | ORAL_TABLET | Freq: Every day | ORAL | Status: DC
  Administered 2019-07-08 – 2019-07-10 (×3): 2000 [IU] via ORAL
  Filled 2019-07-07 (×3): qty 2

## 2019-07-07 MED ORDER — SODIUM CHLORIDE 0.9% FLUSH: 3.0000 mL | INTRAVENOUS | Status: DC | PRN

## 2019-07-07 MED ORDER — ONDANSETRON HCL 4 MG PO TABS: 4.0000 mg | ORAL_TABLET | Freq: Four times a day (QID) | ORAL | Status: DC | PRN

## 2019-07-07 MED ORDER — ACETAMINOPHEN 325 MG PO TABS: 650.0000 mg | ORAL_TABLET | Freq: Four times a day (QID) | ORAL | Status: DC | PRN

## 2019-07-07 MED ORDER — MAGNESIUM OXIDE 400 (241.3 MG) MG PO TABS
400.0000 mg | ORAL_TABLET | Freq: Every day | ORAL | Status: DC
  Administered 2019-07-08 – 2019-07-10 (×3): 400 mg via ORAL
  Filled 2019-07-07 (×6): qty 1

## 2019-07-07 MED ORDER — APIXABAN 5 MG PO TABS
5.0000 mg | ORAL_TABLET | Freq: Two times a day (BID) | ORAL | Status: DC
  Administered 2019-07-07 – 2019-07-10 (×6): 5 mg via ORAL
  Filled 2019-07-07 (×6): qty 1

## 2019-07-07 MED ORDER — B COMPLEX-C PO TABS
1.0000 | ORAL_TABLET | Freq: Every day | ORAL | Status: DC
  Administered 2019-07-08 – 2019-07-10 (×3): 1 via ORAL
  Filled 2019-07-07 (×3): qty 1

## 2019-07-07 MED ORDER — ROPINIROLE HCL 0.25 MG PO TABS
0.2500 mg | ORAL_TABLET | Freq: Two times a day (BID) | ORAL | Status: DC
  Administered 2019-07-07 – 2019-07-10 (×6): 0.25 mg via ORAL
  Filled 2019-07-07 (×7): qty 1

## 2019-07-07 MED ORDER — SODIUM CHLORIDE 0.9 % IV SOLN: 250.0000 mL | INTRAVENOUS | Status: DC | PRN

## 2019-07-07 MED ORDER — FLUTICASONE PROPIONATE 50 MCG/ACT NA SUSP
2.0000 | Freq: Every day | NASAL | Status: DC | PRN
  Filled 2019-07-07: qty 16

## 2019-07-07 MED ORDER — CARBAMIDE PEROXIDE 6.5 % OT SOLN
5.0000 [drp] | Freq: Two times a day (BID) | OTIC | Status: DC
  Administered 2019-07-07 – 2019-07-10 (×6): 5 [drp] via OTIC
  Filled 2019-07-07: qty 15

## 2019-07-07 MED ORDER — GABAPENTIN 300 MG PO CAPS
300.0000 mg | ORAL_CAPSULE | Freq: Two times a day (BID) | ORAL | Status: DC
  Administered 2019-07-07 – 2019-07-10 (×6): 300 mg via ORAL
  Filled 2019-07-07 (×6): qty 1

## 2019-07-07 MED ORDER — IPRATROPIUM-ALBUTEROL 0.5-2.5 (3) MG/3ML IN SOLN: 3.0000 mL | Freq: Four times a day (QID) | RESPIRATORY_TRACT | Status: DC | PRN

## 2019-07-07 MED ORDER — VITAMIN D 50 MCG (2000 UT) PO TABS: 2000.0000 [IU] | ORAL_TABLET | Freq: Every day | ORAL | Status: DC

## 2019-07-07 MED ORDER — ESCITALOPRAM OXALATE 10 MG PO TABS
20.0000 mg | ORAL_TABLET | Freq: Every day | ORAL | Status: DC
  Administered 2019-07-07 – 2019-07-09 (×3): 20 mg via ORAL
  Filled 2019-07-07 (×4): qty 2

## 2019-07-07 MED ORDER — IPRATROPIUM-ALBUTEROL 0.5-2.5 (3) MG/3ML IN SOLN
3.0000 mL | Freq: Once | RESPIRATORY_TRACT | Status: AC
  Administered 2019-07-07: 3 mL via RESPIRATORY_TRACT

## 2019-07-07 MED ORDER — TRAZODONE HCL 50 MG PO TABS: 50.0000 mg | ORAL_TABLET | Freq: Every evening | ORAL | Status: DC | PRN

## 2019-07-07 MED ORDER — B COMPLEX VITAMINS PO CAPS: 1.0000 | ORAL_CAPSULE | Freq: Every day | ORAL | Status: DC

## 2019-07-07 MED ORDER — METHYLPREDNISOLONE SODIUM SUCC 125 MG IJ SOLR
125.0000 mg | Freq: Once | INTRAMUSCULAR | Status: AC
  Administered 2019-07-07: 125 mg via INTRAVENOUS
  Filled 2019-07-07: qty 2

## 2019-07-07 MED ORDER — SODIUM CHLORIDE 0.9 % IV BOLUS
500.0000 mL | Freq: Once | INTRAVENOUS | Status: AC
  Administered 2019-07-07: 500 mL via INTRAVENOUS

## 2019-07-07 MED ORDER — SENNOSIDES-DOCUSATE SODIUM 8.6-50 MG PO TABS: 1.0000 | ORAL_TABLET | Freq: Every evening | ORAL | Status: DC | PRN

## 2019-07-07 MED ORDER — DILTIAZEM HCL 30 MG PO TABS
30.0000 mg | ORAL_TABLET | Freq: Three times a day (TID) | ORAL | Status: DC
  Administered 2019-07-07 – 2019-07-10 (×7): 30 mg via ORAL
  Filled 2019-07-07 (×8): qty 1

## 2019-07-07 MED ORDER — THEOPHYLLINE ER 400 MG PO TB24
400.0000 mg | ORAL_TABLET | ORAL | Status: DC
  Administered 2019-07-08 – 2019-07-10 (×3): 400 mg via ORAL
  Filled 2019-07-07 (×3): qty 1

## 2019-07-07 MED ORDER — MOMETASONE FURO-FORMOTEROL FUM 200-5 MCG/ACT IN AERO
2.0000 | INHALATION_SPRAY | Freq: Two times a day (BID) | RESPIRATORY_TRACT | Status: DC
  Administered 2019-07-07 – 2019-07-10 (×6): 2 via RESPIRATORY_TRACT
  Filled 2019-07-07: qty 8.8

## 2019-07-07 MED ORDER — IPRATROPIUM-ALBUTEROL 0.5-2.5 (3) MG/3ML IN SOLN
3.0000 mL | Freq: Once | RESPIRATORY_TRACT | Status: AC
  Administered 2019-07-07: 3 mL via RESPIRATORY_TRACT
  Filled 2019-07-07: qty 6

## 2019-07-07 MED ORDER — DOXYCYCLINE HYCLATE 100 MG PO TABS
100.0000 mg | ORAL_TABLET | Freq: Every day | ORAL | Status: DC
  Administered 2019-07-07 – 2019-07-10 (×4): 100 mg via ORAL
  Filled 2019-07-07 (×4): qty 1

## 2019-07-07 MED ORDER — FUROSEMIDE 40 MG PO TABS
40.0000 mg | ORAL_TABLET | Freq: Two times a day (BID) | ORAL | Status: DC
  Administered 2019-07-08: 40 mg via ORAL
  Filled 2019-07-07: qty 1

## 2019-07-07 MED ORDER — SALINE SPRAY 0.65 % NA SOLN
1.0000 | NASAL | Status: DC | PRN
  Filled 2019-07-07: qty 44

## 2019-07-07 MED ORDER — POTASSIUM CHLORIDE CRYS ER 20 MEQ PO TBCR
20.0000 meq | EXTENDED_RELEASE_TABLET | Freq: Two times a day (BID) | ORAL | Status: DC
  Administered 2019-07-08 – 2019-07-10 (×4): 20 meq via ORAL
  Filled 2019-07-07 (×6): qty 1

## 2019-07-07 MED ORDER — ACETAMINOPHEN 650 MG RE SUPP: 650.0000 mg | Freq: Four times a day (QID) | RECTAL | Status: DC | PRN

## 2019-07-07 MED ORDER — METHYLPREDNISOLONE SODIUM SUCC 125 MG IJ SOLR
60.0000 mg | Freq: Four times a day (QID) | INTRAMUSCULAR | Status: DC
  Administered 2019-07-07 – 2019-07-09 (×7): 60 mg via INTRAVENOUS
  Filled 2019-07-07 (×7): qty 2

## 2019-07-07 MED ORDER — BIOTENE DRY MOUTH MT LIQD
15.0000 mL | Freq: Every day | OROMUCOSAL | Status: DC | PRN
  Filled 2019-07-07: qty 15

## 2019-07-07 MED ORDER — ALPRAZOLAM 1 MG PO TABS
1.0000 mg | ORAL_TABLET | Freq: Three times a day (TID) | ORAL | Status: DC | PRN
  Administered 2019-07-07 – 2019-07-10 (×7): 1 mg via ORAL
  Filled 2019-07-07 (×7): qty 1

## 2019-07-07 MED ORDER — ONDANSETRON HCL 4 MG/2ML IJ SOLN: 4.0000 mg | Freq: Four times a day (QID) | INTRAMUSCULAR | Status: DC | PRN

## 2019-07-07 NOTE — H&P (Signed)
[facility] - [place] at [facility]   PATIENT NAME: Dustin [name]    MR#:  [id number]  DATE OF BIRTH:  July 16, 1956  DATE OF ADMISSION:  07/07/2019  PRIMARY CARE PHYSICIAN: McLean-Scocuzza, Pasty Spillers, MD   REQUESTING/REFERRING PHYSICIAN:   CHIEF COMPLAINT:   Chief Complaint  Patient presents with  . Shortness of Breath    HISTORY OF PRESENT ILLNESS: Dustin [name]  is a 63 y.o. male with a known history of COPD on home oxygen at 2 L via nasal cannula, chronic diastolic heart failure with EF of 60%, GERD, pneumonia in the past, hypertension, left leg DVT, pulmonary resume on Eliquis for anticoagulation, pulmonary hypertension, tobacco abuse presented to the emergency room for shortness of breath.  Patient has increased shortness of breath and wheezing for the last few days.  He tried inhaler treatments at home but did not help.  Currently is on oxygen via nasal cannula at 4 L in the emergency room.  Was given IV Solu-Medrol and ablation therapy.  No fever chills cough and myalgias.  No recent travel.  COVID-19 test pending.  PAST MEDICAL HISTORY:   Past Medical History:  Diagnosis Date  . Allergy   . Anxiety   . Asthma   . Chronic diastolic CHF (congestive heart failure) (HCC)    a. echo 07/2013: EF 60-65%, DD, biatrial dilatation, Ao sclerosis, dilated RV, moderate pulmonary HTN, elevated CV and RA pressures; b. patient reported echo at Dr. Milta Deiters office 02/2015 - his office does not have record of him being a pt there c. echo 11/2015: EF 60-65%, Grade 1 DD, mod-severe pulm pressures  . Chronic respiratory failure (HCC)    a. on 2L via nasal cannula; b. secondary to COPD  . COPD (chronic obstructive pulmonary disease) (HCC)    on 2L continuous   . Depression   . Emphysema of lung (HCC)   . GERD (gastroesophageal reflux disease)   . HCAP (healthcare-associated pneumonia)    01/22/18-01/28/18   . Headache   . Hypertension   . Leg DVT (deep venous  thromboembolism), acute, bilateral (HCC)    02/26/18  . Personal history of tobacco use, presenting hazards to health 08/17/2015  . Pulmonary embolism (HCC)    02/26/18  . Pulmonary HTN (HCC)   . Tobacco abuse     PAST SURGICAL HISTORY:  Past Surgical History:  Procedure Laterality Date  . ABDOMINAL SURGERY    . ADENOIDECTOMY    . CARPAL TUNNEL RELEASE Right   . corpal tunnel Right   . ELBOW SURGERY Right    repaired tendon  . hemmorhoid N/A   . HEMORRHOID SURGERY    . NASAL SINUS SURGERY    . NASAL SINUS SURGERY     1970s  . NOSE SURGERY    . reflux surgery     1994  . ROTATOR CUFF REPAIR Right   . SHOULDER SURGERY    . TENNIS ELBOW RELEASE/NIRSCHEL PROCEDURE Right   . TONSILLECTOMY    . URETHRA SURGERY     surgery 6 times from age [age] yrs old  . URETHRA SURGERY      SOCIAL HISTORY:  Social History   Tobacco Use  . Smoking status: Former Smoker    Packs/day: 2.00    Years: 40.00    Pack years: 80.00    Types: Cigarettes    Quit date: 01/14/2018    Years since quitting: 1.4  . Smokeless tobacco: Never Used  Substance Use Topics  .  Alcohol use: No    FAMILY HISTORY:  Family History  Problem Relation Age of Onset  . CAD Father   . Hyperlipidemia Father   . Stroke Father   . Heart disease Father   . Arthritis Father   . Hearing loss Father   . Hypertension Father   . Hypertension Mother   . Peripheral Artery Disease Mother   . Rheum arthritis Mother   . Asthma Mother   . Bipolar disorder Mother   . Depression Mother   . Malignant hypertension Mother   . Arthritis Mother   . Hearing loss Mother   . Heart disease Mother   . Hyperlipidemia Mother   . Kidney disease Mother   . Birth defects Brother   . Heart disease Brother   . Alcohol abuse Daughter   . Arthritis Daughter   . Asthma Daughter   . Depression Daughter   . Drug abuse Daughter   . Miscarriages / [place] Daughter   . Intellectual disability Daughter   . Kidney disease Son   .  Birth defects Paternal Grandmother   . Depression Paternal Grandmother   . Heart disease Paternal Grandmother   . Birth defects Sister   . Diabetes Neg Hx     DRUG ALLERGIES:  Allergies  Allergen Reactions  . Asa [Aspirin] Other (See Comments)    Reaction: swelling of the right side.  . Bee Venom Swelling  . Codeine Other (See Comments)    Reaction: Difficulty breathing  . Ibuprofen Other (See Comments)    Reaction: Swelling of the right side.  . Iodinated Diagnostic Agents Other (See Comments)    Reaction:  Unknown  Other reaction(s): Unknown    REVIEW OF SYSTEMS:   CONSTITUTIONAL: No fever,has  fatigue and weakness.  EYES: No blurred or double vision.  EARS, NOSE, AND THROAT: No tinnitus or ear pain.  RESPIRATORY: Has cough, shortness of breath, wheezing  no hemoptysis.  CARDIOVASCULAR: No chest pain, orthopnea, edema.  GASTROINTESTINAL: No nausea, vomiting, diarrhea or abdominal pain.  GENITOURINARY: No dysuria, hematuria.  ENDOCRINE: No polyuria, nocturia,  HEMATOLOGY: No anemia, easy bruising or bleeding SKIN: No rash or lesion. MUSCULOSKELETAL: No joint pain or arthritis.   NEUROLOGIC: No tingling, numbness, weakness.  PSYCHIATRY: No anxiety or depression.   MEDICATIONS AT HOME:  Prior to Admission medications   Medication Sig Start Date End Date Taking? Authorizing Provider  Acetylcysteine (NAC PO) Take 1 tablet by mouth daily.    [provider]  ALPRAZolam Prudy Feeler) 1 MG tablet Take 1 tablet (1 mg total) by mouth 3 (three) times daily as needed for anxiety. 04/17/19   McLean-Scocuzza, Pasty Spillers, MD  AMBULATORY NON FORMULARY MEDICATION 1 each by Does not apply route as needed. Please provide patient with "So Clean" CPAP cleaning device to be used as needed. 12/04/18   Merwyn Katos, MD  antiseptic oral rinse (BIOTENE) LIQD 15 mLs by Mouth Rinse route daily as needed for dry mouth. 12/06/18   McLean-Scocuzza, Pasty Spillers, MD  apixaban (ELIQUIS) 5 MG TABS tablet  Take 1 tablet (5 mg total) by mouth 2 (two) times daily. 02/18/19   McLean-Scocuzza, Pasty Spillers, MD  azithromycin (ZITHROMAX) 250 MG tablet Take 250 mg by mouth every other day.    [provider]  b complex vitamins capsule Take 1 capsule by mouth daily.    [provider]  carbamide peroxide (DEBROX) 6.5 % OTIC solution Place 5 drops into both ears 2 (two) times daily. Prn x  4-7 days 12/06/18   McLean-Scocuzza, Nino Glow, MD  Cholecalciferol (VITAMIN D) 2000 units tablet Take 2,000 Units by mouth 2 (two) times a day.     [provider]  clobetasol (TEMOVATE) 0.05 % external solution Apply 1 application topically 2 (two) times daily. Prn to scalp and ears 08/07/18   McLean-Scocuzza, Nino Glow, MD  diltiazem (CARDIZEM) 30 MG tablet Take 1 tablet (30 mg total) by mouth 3 (three) times daily as needed. 11/17/18   McLean-Scocuzza, Nino Glow, MD  escitalopram (LEXAPRO) 20 MG tablet Take 1 tablet (20 mg total) by mouth at bedtime. 02/25/19   McLean-Scocuzza, Nino Glow, MD  fluticasone (FLONASE) 50 MCG/ACT nasal spray Place 2 sprays into both nostrils daily as needed for allergies or rhinitis. 12/06/18   McLean-Scocuzza, Nino Glow, MD  furosemide (LASIX) 40 MG tablet Take 1 tablet (40 mg) by mouth twice daily 07/02/19   Minna Merritts, MD  gabapentin (NEURONTIN) 300 MG capsule TAKE 1 TO 2 CAPSULES BY MOUTH THREE TIMES DAILY 04/11/19   McLean-Scocuzza, Nino Glow, MD  hydrocortisone 2.5 % cream Apply topically 2 (two) times daily as needed. face 05/29/18   McLean-Scocuzza, Nino Glow, MD  ipratropium-albuterol (DUONEB) 0.5-2.5 (3) MG/3ML SOLN Take 3 mLs by nebulization every 6 (six) hours as needed. 04/10/19   McLean-Scocuzza, Nino Glow, MD  ketoconazole (NIZORAL) 2 % shampoo Apply 1 application topically 2 (two) times a week. Let stand for 5 minutes 05/30/18   McLean-Scocuzza, Nino Glow, MD  magnesium oxide (MAG-OX) 400 MG tablet Take 400 mg by mouth daily.    [provider]  mometasone-formoterol  (DULERA) 200-5 MCG/ACT AERO Inhale 2 puffs into the lungs 2 (two) times daily. 05/21/18   Wilhelmina Mcardle, MD  mupirocin ointment (BACTROBAN) 2 % Apply 1 application topically 2 (two) times daily. Use left upper arm  And left ear x 1-2 weeks 08/07/18   McLean-Scocuzza, Nino Glow, MD  nystatin (MYCOSTATIN) 100000 UNIT/ML suspension Use as directed 5 mLs (500,000 Units total) in the mouth or throat 4 (four) times daily. Swish and spit 3 or 4 times daily for at least 5 days.  Then may be used as needed thereafter for thrush 11/15/18   Wilhelmina Mcardle, MD  potassium chloride (KLOR-CON) 10 MEQ tablet Take 4 tablets (40 meq) by mouth once daily 07/02/19   Minna Merritts, MD  Probiotic Product (PROBIOTIC PO) Take 1 capsule by mouth 2 (two) times a day.     [provider]  rOPINIRole (REQUIP) 0.25 MG tablet Take 1 tablet (0.25 mg total) by mouth 2 (two) times daily. 02/25/19   McLean-Scocuzza, Nino Glow, MD  sodium chloride (OCEAN) 0.65 % SOLN nasal spray Place 1-2 sprays into both nostrils as needed for congestion. 03/12/19   McLean-Scocuzza, Nino Glow, MD  Spacer/Aero-Holding Chambers [facility] ADVANTAGE-LG MASK) MISC Use as directed with inhaler diag  j44.1 09/28/17   Allyne Gee, MD  theophylline (UNIPHYL) 400 MG 24 hr tablet Take 1 tablet (400 mg total) by mouth every morning. 03/03/19   Wilhelmina Mcardle, MD  Tiotropium Bromide Monohydrate (SPIRIVA RESPIMAT) 2.5 MCG/ACT AERS Inhale 2 puffs into the lungs daily. 01/08/19   Wilhelmina Mcardle, MD  traZODone (DESYREL) 50 MG tablet Take 1-2 tablets (50-100 mg total) by mouth at bedtime as needed for sleep. 01/14/19   McLean-Scocuzza, Nino Glow, MD      PHYSICAL EXAMINATION:   VITAL SIGNS: Blood pressure 127/83, pulse (!) 103, temperature 99.4 F (37.4 C), temperature source  Oral, resp. rate (!) 29, height 5\' 10"  (1.778 m), weight 102 kg, SpO2 95 %.  GENERAL:  63 y.o.-year-old patient lying in the bed with no acute distress.  EYES: Pupils equal, round,  reactive to light and accommodation. No scleral icterus. Extraocular muscles intact.  HEENT: Head atraumatic, normocephalic. Oropharynx and nasopharynx clear.  NECK:  Supple, no jugular venous distention. No thyroid enlargement, no tenderness.  LUNGS: Decreased breath sounds bilaterally, bilateral wheezing. No use of accessory muscles of respiration.  CARDIOVASCULAR: S1, S2 normal. No murmurs, rubs, or gallops.  ABDOMEN: Soft, nontender, nondistended. Bowel sounds present. No organomegaly or mass.  EXTREMITIES: No pedal edema, cyanosis, or clubbing.  NEUROLOGIC: Cranial nerves II through XII are intact. Muscle strength 5/5 in all extremities. Sensation intact. Gait not checked.  PSYCHIATRIC: The patient is alert and oriented x 3.  SKIN: No obvious rash, lesion, or ulcer.   LABORATORY PANEL:   CBC Recent Labs  Lab 07/07/19 1059  WBC 12.7*  HGB 13.8  HCT 44.8  PLT 143*  MCV 104.2*  MCH 32.1  MCHC 30.8  RDW 13.4   ------------------------------------------------------------------------------------------------------------------  Chemistries  Recent Labs  Lab 07/07/19 1059  NA 143  K 4.1  CL 100  CO2 35*  GLUCOSE 105*  BUN 17  CREATININE 1.10  CALCIUM 9.4   ------------------------------------------------------------------------------------------------------------------ estimated creatinine clearance is 80.1 mL/min (by C-G formula based on SCr of 1.1 mg/dL). ------------------------------------------------------------------------------------------------------------------ No results for input(s): TSH, T4TOTAL, T3FREE, THYROIDAB in the last 72 hours.  Invalid input(s): FREET3   Coagulation profile No results for input(s): INR, PROTIME in the last 168 hours. ------------------------------------------------------------------------------------------------------------------- No results for input(s): DDIMER in the last 72  hours. -------------------------------------------------------------------------------------------------------------------  Cardiac Enzymes No results for input(s): CKMB, TROPONINI, MYOGLOBIN in the last 168 hours.  Invalid input(s): CK ------------------------------------------------------------------------------------------------------------------ Invalid input(s): POCBNP  ---------------------------------------------------------------------------------------------------------------  Urinalysis    Component Value Date/Time   COLORURINE YELLOW 05/23/2019 0820   APPEARANCEUR CLEAR 05/23/2019 0820   APPEARANCEUR Hazy 07/23/2014 2110   LABSPEC 1.023 05/23/2019 0820   LABSPEC 1.027 07/23/2014 2110   PHURINE < OR = 5.0 05/23/2019 0820   GLUCOSEU NEGATIVE 05/23/2019 0820   GLUCOSEU Negative 07/23/2014 2110   HGBUR NEGATIVE 05/23/2019 0820   BILIRUBINUR NEGATIVE 04/25/2019 1733   BILIRUBINUR Negative 07/23/2014 2110   KETONESUR NEGATIVE 05/23/2019 0820   PROTEINUR NEGATIVE 05/23/2019 0820   NITRITE NEGATIVE 05/23/2019 0820   LEUKOCYTESUR NEGATIVE 05/23/2019 0820   LEUKOCYTESUR Negative 07/23/2014 2110     RADIOLOGY: Dg Chest 2 View  Result Date: 07/07/2019 CLINICAL DATA:  Shortness of breath EXAM: CHEST - 2 VIEW COMPARISON:  April 25, 2019 FINDINGS: There is underlying emphysematous change with bullous disease in the upper lobes. There is patchy atelectasis in the lung bases. There is no frank edema or consolidation. Heart size is upper normal. The pulmonary vascularity reflects underlying emphysematous change with diminished vascularity in the upper lobes. No adenopathy evident. No bone lesions. IMPRESSION: Emphysematous change with bibasilar atelectasis. No frank edema or consolidation. Heart upper normal in size. Pulmonary vascularity reflects underlying emphysematous change. No adenopathy evident. Emphysema (ICD10-J43.9). Electronically Signed   By: Bretta BangWilliam  Woodruff III M.D.    On: 07/07/2019 11:41    EKG: Orders placed or performed during the hospital encounter of 07/07/19  . ED EKG  . ED EKG  . EKG 12-Lead  . EKG 12-Lead    IMPRESSION AND PLAN: 63 year old male patient with a known history of COPD on home oxygen at 2 L via nasal cannula,  chronic diastolic heart failure with EF of 60%, GERD, pneumonia in the past, hypertension, left leg DVT, pulmonary resume on Eliquis for anticoagulation, pulmonary hypertension, tobacco abuse presented to the emergency room for shortness of breath.  -Acute COPD exacerbation Admit patient to medical floor IV Solu-Medrol 60 mg every 6 hourly Aggressive nebulization therapy Oxygen via nasal cannula Oral doxycycline antibiotic  -Chronic diastolic heart failure with preserved ejection fraction Continue Lasix for diuresis Monitor electrolytes  -History of DVT) and embolism Restart Eliquis for anticoagulation  -Tobacco abuse Tobacco cessation counseled the patient for 6 minutes Nicotine patch offered  All the records are reviewed and case discussed with ED provider. Management plans discussed with the patient, family and they are in agreement.  CODE STATUS:Full code Code Status History    Date Active Date Inactive Code Status Order ID Comments User Context   01/23/2018 0141 01/28/2018 2201 Full Code [id number]  Cammy Copa, MD Inpatient   01/15/2018 1038 01/21/2018 2000 Full Code [id number]  Enid Baas, MD Inpatient   11/22/2017 1747 11/29/2017 1654 Full Code [id number]  Kathlene Cote, PA-C Inpatient   09/19/2017 0459 09/24/2017 1936 Full Code [id number]  Arnaldo Natal, MD ED   08/06/2017 1822 08/14/2017 1304 Full Code [id number]  Alford Highland, MD ED   06/28/2017 2304 07/03/2017 2113 Full Code [id number]  Auburn Bilberry, MD ED   01/22/2017 2129 01/29/2017 1944 Full Code [id number]  Houston Siren, MD Inpatient   06/23/2016 0001 06/28/2016 1943 Full Code [id number]  Oralia Manis, MD Inpatient   03/25/2016 1703 03/31/2016  2308 Full Code [id number]  Standley Brooking, MD Inpatient   11/30/2015 0512 12/06/2015 2134 Full Code [id number]  Arnaldo Natal, MD Inpatient   04/13/2015 1722 04/21/2015 1940 Full Code [id number]  Auburn Bilberry, MD Inpatient   03/31/2015 2050 04/05/2015 2048 Full Code [id number]  Hower, Cletis Athens, MD ED   Advance Care Planning Activity       TOTAL TIME TAKING CARE OF THIS PATIENT: 53 minutes.    Ihor Austin M.D on 07/07/2019 at 4:23 PM  Between 7am to 6pm - Pager - [phone]  After 6pm go to [email or web address] - password [email or web address] [facility]  [place] [facility]  Office  [phone]  CC: Primary care physician; McLean-Scocuzza, Pasty Spillers, MD

## 2019-07-07 NOTE — Progress Notes (Signed)
Advanced care plan.  Purpose of the Encounter: CODE STATUS  Parties in Attendance: Patient  Patient's Decision Capacity: Good  Subjective/Patient's story: 63 y.o. male with a known history of COPD on home oxygen at 2 L via nasal cannula, chronic diastolic heart failure with EF of 60%, GERD, pneumonia in the past, hypertension, left leg DVT, pulmonary resume on Eliquis for anticoagulation, pulmonary hypertension, tobacco abuse presented to the emergency room for shortness of breath.  Patient has increased shortness of breath and wheezing for the last few days.  He tried inhaler treatments at home but did not help.  Currently is on oxygen via nasal cannula at 4 L in the emergency room.  Was given IV Solu-Medrol and ablation therapy.  No fever chills cough and myalgias.  No recent travel.  COVID-19 test pending.   Objective/Medical story Patient needs aggressive nebulization therapy Needs IV steroids   Goals of care determination:  Advance care directives goals of care treatment plan discussed For now patient wants everything done which includes CPR, intubation ventilator if the need arises   CODE STATUS: Full code   Time spent discussing advanced care planning: 16 minutes

## 2019-07-07 NOTE — ED Notes (Signed)
Pt states he is at the ED because [place] would not see him because his "breathing was weird from rushing" and sent him to the ED. Pt denies SHOB, chest pain, chest pressure, chest discomfort at this moment. PT appears in NAD at this time. EDP at bedside
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
## 2019-07-07 NOTE — ED Notes (Signed)
Pt visualized by this RN, no SHOB noted, speaking in full sentences.

## 2019-07-07 NOTE — Telephone Encounter (Signed)
HgbA1c added on. I have called and notified the patient of this.

## 2019-07-07 NOTE — ED Triage Notes (Signed)
Pt states that he was going to first appt to pulmonologist and was brought to ER for "breathing being off". Pt states he wears 2L [place] chronically but was placed on 6L [place] by [facility] staff. Pt states he usually feel SOB when he arrives to doctor's appt due to rushing and a lot of walking. Pt does not appear in any distress, states his SOB is minimally above his baseline at this time. Pt color WNL, RR even and unlabored.

## 2019-07-07 NOTE — ED Notes (Signed)
Sister/POA, [name] updated by this RN on patient's status. Pt gave permission.

## 2019-07-07 NOTE — ED Notes (Signed)
RN to bedside due to oxygen saturation. PT in NAD and denies feeling shorter of breath. No increased WOB noted. Oxygen via [place] put at 3L and Oxygen saturation only improved to 88%. Oxygen then increased to 4L via [place].

## 2019-07-07 NOTE — ED Notes (Signed)
Stuck X 1 for blood collection, not success.

## 2019-07-07 NOTE — Progress Notes (Signed)
Patient ID: Dustin [name], male   DOB: 11/20/1955, 63 y.o.   MRN: [id number]   Patient had to be transported directly to the emergency room due to having severe hypoxemia.  On 2 L/min (pulsed) patient was 83% saturated.  On 6 L 91%.  Patient was very tachypneic.  Transported to emergency room for evaluation possible admission for acute exacerbation of COPD.  Renold Don, MD [place] [facility]

## 2019-07-07 NOTE — ED Notes (Signed)
Sister/POA Caroline [phone]

## 2019-07-08 ENCOUNTER — Other Ambulatory Visit: Payer: Self-pay | Admitting: *Deleted

## 2019-07-08 LAB — BASIC METABOLIC PANEL
Anion gap: 6 (ref 5–15)
BUN: 20 mg/dL (ref 8–23)
CO2: 36 mmol/L — ABNORMAL HIGH (ref 22–32)
Calcium: 8.8 mg/dL — ABNORMAL LOW (ref 8.9–10.3)
Chloride: 100 mmol/L (ref 98–111)
Creatinine, Ser: 1.27 mg/dL — ABNORMAL HIGH (ref 0.61–1.24)
GFR calc Af Amer: >60 mL/min (ref >60)
GFR calc non Af Amer: 59 mL/min — ABNORMAL LOW (ref >60)
Glucose, Bld: 172 mg/dL — ABNORMAL HIGH (ref 70–99)
Potassium: 4.8 mmol/L (ref 3.5–5.1)
Sodium: 142 mmol/L (ref 135–145)

## 2019-07-08 LAB — EXPECTORATED SPUTUM ASSESSMENT W GRAM STAIN, RFLX TO RESP C: Special Requests: NORMAL

## 2019-07-08 LAB — URINALYSIS, COMPLETE (UACMP) WITH MICROSCOPIC
Bacteria, UA: NONE SEEN
Bilirubin Urine: NEGATIVE
Glucose, UA: NEGATIVE mg/dL
Hgb urine dipstick: NEGATIVE
Ketones, ur: NEGATIVE mg/dL
Leukocytes,Ua: NEGATIVE
Nitrite: NEGATIVE
Protein, ur: NEGATIVE mg/dL
Specific Gravity, Urine: 1.026 (ref 1.005–1.030)
Waxy Casts, UA: 1
pH: 5 (ref 5.0–8.0)

## 2019-07-08 LAB — CBC
HCT: 41.2 % (ref 39.0–52.0)
Hemoglobin: 12.4 g/dL — ABNORMAL LOW (ref 13.0–17.0)
MCH: 31.5 pg (ref 26.0–34.0)
MCHC: 30.1 g/dL (ref 30.0–36.0)
MCV: 104.6 fL — ABNORMAL HIGH (ref 80.0–100.0)
Platelets: 133 10*3/uL — ABNORMAL LOW (ref 150–400)
RBC: 3.94 MIL/uL — ABNORMAL LOW (ref 4.22–5.81)
RDW: 13 % (ref 11.5–15.5)
WBC: 9.8 10*3/uL (ref 4.0–10.5)
nRBC: 0 % (ref 0.0–0.2)

## 2019-07-08 LAB — HIV ANTIBODY (ROUTINE TESTING W REFLEX): HIV Screen 4th Generation wRfx: NONREACTIVE

## 2019-07-08 NOTE — Patient Outreach (Signed)
[facility]) Care Management  07/08/2019  Dustin [name] 01-14-1956 [id number]   Care Coordination   Noted patient followed in [facility] complex care management  with inpatient admission on 10/19 Dx: Acute on chronic respiratory failure with hypoxia, COPD exacerbation .   Plan Will alert [facility]  hospital liaison,  team and follow for disposition discharge plans    Joylene Draft, RN, [place] Management Coordinator  [phone]- Mobile [phone]- [place] Office

## 2019-07-08 NOTE — Progress Notes (Addendum)
[place] [facility] [place] NAME: Marcoantonio Legault    MR#:  [id number]  DATE OF BIRTH:  [birth year]  SUBJECTIVE:   Patient states that his shortness of breath is better this morning.  He endorses cough productive of yellow/green sputum.  He feels like his shortness of breath gets worse anytime he tries to move around.  No fevers or chills.  REVIEW OF SYSTEMS:  Review of Systems  Constitutional: Negative for chills and fever.  HENT: Negative for congestion and sore throat.   Eyes: Negative for blurred vision and double vision.  Respiratory: Positive for cough, sputum production, shortness of breath and wheezing.   Cardiovascular: Negative for chest pain and palpitations.  Gastrointestinal: Negative for nausea and vomiting.  Genitourinary: Negative for dysuria and urgency.  Musculoskeletal: Negative for back pain and neck pain.  Neurological: Negative for dizziness and headaches.  Psychiatric/Behavioral: Negative for depression. The patient is not nervous/anxious.     DRUG ALLERGIES:   Allergies  Allergen Reactions  . Asa [Aspirin] Other (See Comments)    Reaction: swelling of the right side.  . Bee Venom Swelling  . Codeine Other (See Comments)    Reaction: Difficulty breathing  . Ibuprofen Other (See Comments)    Reaction: Swelling of the right side.  . Iodinated Diagnostic Agents Other (See Comments)    Reaction:  Unknown  Other reaction(s): Unknown   VITALS:  Blood pressure 97/63, pulse 71, temperature 97.8 F (36.6 C), temperature source Oral, resp. rate 16, height 5\' 10"  (1.778 m), weight 102 kg, SpO2 93 %. PHYSICAL EXAMINATION:  Physical Exam  GENERAL:  Laying in the bed with no acute distress.  HEENT: Head atraumatic, normocephalic. Pupils equal, round, reactive to light and accommodation. No scleral icterus. Extraocular muscles intact. Oropharynx and nasopharynx clear.  NECK:  Supple, no jugular venous distention. No thyroid  enlargement. LUNGS: + Diminished breath sounds throughout all lung fields.  Scattered expiratory wheezing present.  Able to speak in full sentences. No use of accessory muscles of respiration.  CARDIOVASCULAR: RRR, S1, S2 normal. No murmurs, rubs, or gallops.  ABDOMEN: Soft, nontender, nondistended. Bowel sounds present.  EXTREMITIES: No pedal edema, cyanosis, or clubbing.  NEUROLOGIC: CN 2-12 intact, no focal deficits. 5/5 muscle strength throughout all extremities. Sensation intact throughout. Gait not checked.  PSYCHIATRIC: The patient is alert and oriented x 3.  SKIN: No obvious rash, lesion, or ulcer.  LABORATORY PANEL:  Male CBC Recent Labs  Lab 07/08/19 0341  WBC 9.8  HGB 12.4*  HCT 41.2  PLT 133*   ------------------------------------------------------------------------------------------------------------------ Chemistries  Recent Labs  Lab 07/08/19 0341  NA 142  K 4.8  CL 100  CO2 36*  GLUCOSE 172*  BUN 20  CREATININE 1.27*  CALCIUM 8.8*   RADIOLOGY:  No results found. ASSESSMENT AND PLAN:   Acute exacerbation of COPD with a history of chronic hypoxic respiratory failure- patient is stable on his home 2 L O2. -Continue IV Solu-Medrol -Scheduled duo nebs -Continue doxycycline -Continue supplemental oxygen -Sputum culture pending -Continue home inhalers  Chronic diastolic congestive heart failure- patient does not appear volume overloaded. Creatinine has increased from yesterday to today. -Holding home Lasix -Recheck BMP in the morning  History of DVT/PE -Continue home Eliquis  Macrocytic anemia- hemoglobin close to baseline -Check B12 and folate  Tobacco use -Nicotine patch as needed  Sister, [name], updated via the phone.  All the records are reviewed and case discussed with Care Management/Social Worker. Management  plans discussed with the patient, family and they are in agreement.  CODE STATUS: Full Code  TOTAL TIME TAKING CARE OF THIS  PATIENT: 40 minutes.   More than 50% of the time was spent in counseling/coordination of care: YES  POSSIBLE D/C IN 1-2 DAYS, DEPENDING ON CLINICAL CONDITION.  Jinny Blossom Max Romano M.D on 07/08/2019 at 2:41 PM  Between 7am to 6pm - Pager [phone]  After 6pm go to [email or web address] - Social research officer, government  [employer] [facility]  Office  [phone]  CC: Primary care physician; McLean-Scocuzza, Pasty Spillers, MD  Note: This dictation was prepared with Dragon dictation along with smaller phrase technology. Any transcriptional errors that result from this process are unintentional.

## 2019-07-09 ENCOUNTER — Ambulatory Visit: Payer: Medicare Other | Admitting: Surgery

## 2019-07-09 ENCOUNTER — Inpatient Hospital Stay: Payer: Medicare Other

## 2019-07-09 LAB — CBC
HCT: 39.4 % (ref 39.0–52.0)
Hemoglobin: 12.1 g/dL — ABNORMAL LOW (ref 13.0–17.0)
MCH: 31.7 pg (ref 26.0–34.0)
MCHC: 30.7 g/dL (ref 30.0–36.0)
MCV: 103.1 fL — ABNORMAL HIGH (ref 80.0–100.0)
Platelets: 150 10*3/uL (ref 150–400)
RBC: 3.82 MIL/uL — ABNORMAL LOW (ref 4.22–5.81)
RDW: 12.7 % (ref 11.5–15.5)
WBC: 16.8 10*3/uL — ABNORMAL HIGH (ref 4.0–10.5)
nRBC: 0 % (ref 0.0–0.2)

## 2019-07-09 LAB — BRAIN NATRIURETIC PEPTIDE: B Natriuretic Peptide: 124 pg/mL — ABNORMAL HIGH (ref 0.0–100.0)

## 2019-07-09 LAB — BASIC METABOLIC PANEL
Anion gap: 10 (ref 5–15)
BUN: 35 mg/dL — ABNORMAL HIGH (ref 8–23)
CO2: 34 mmol/L — ABNORMAL HIGH (ref 22–32)
Calcium: 9.4 mg/dL (ref 8.9–10.3)
Chloride: 99 mmol/L (ref 98–111)
Creatinine, Ser: 1.29 mg/dL — ABNORMAL HIGH (ref 0.61–1.24)
GFR calc Af Amer: >60 mL/min (ref >60)
GFR calc non Af Amer: 58 mL/min — ABNORMAL LOW (ref >60)
Glucose, Bld: 158 mg/dL — ABNORMAL HIGH (ref 70–99)
Potassium: 4.7 mmol/L (ref 3.5–5.1)
Sodium: 143 mmol/L (ref 135–145)

## 2019-07-09 LAB — EXPECTORATED SPUTUM ASSESSMENT W GRAM STAIN, RFLX TO RESP C: Special Requests: NORMAL

## 2019-07-09 LAB — VITAMIN B12: Vitamin B-12: 231 pg/mL (ref 180–914)

## 2019-07-09 LAB — FOLATE: Folate: 12.3 ng/mL (ref >5.9)

## 2019-07-09 IMAGING — CR DG CHEST 2V
2 series · 2 of 2 positions shown · non-contrast
Comparison: 11/22/2017

CLINICAL DATA: Shortness of breath, COPD

EXAM:
CHEST - 2 VIEW

[chest pa]
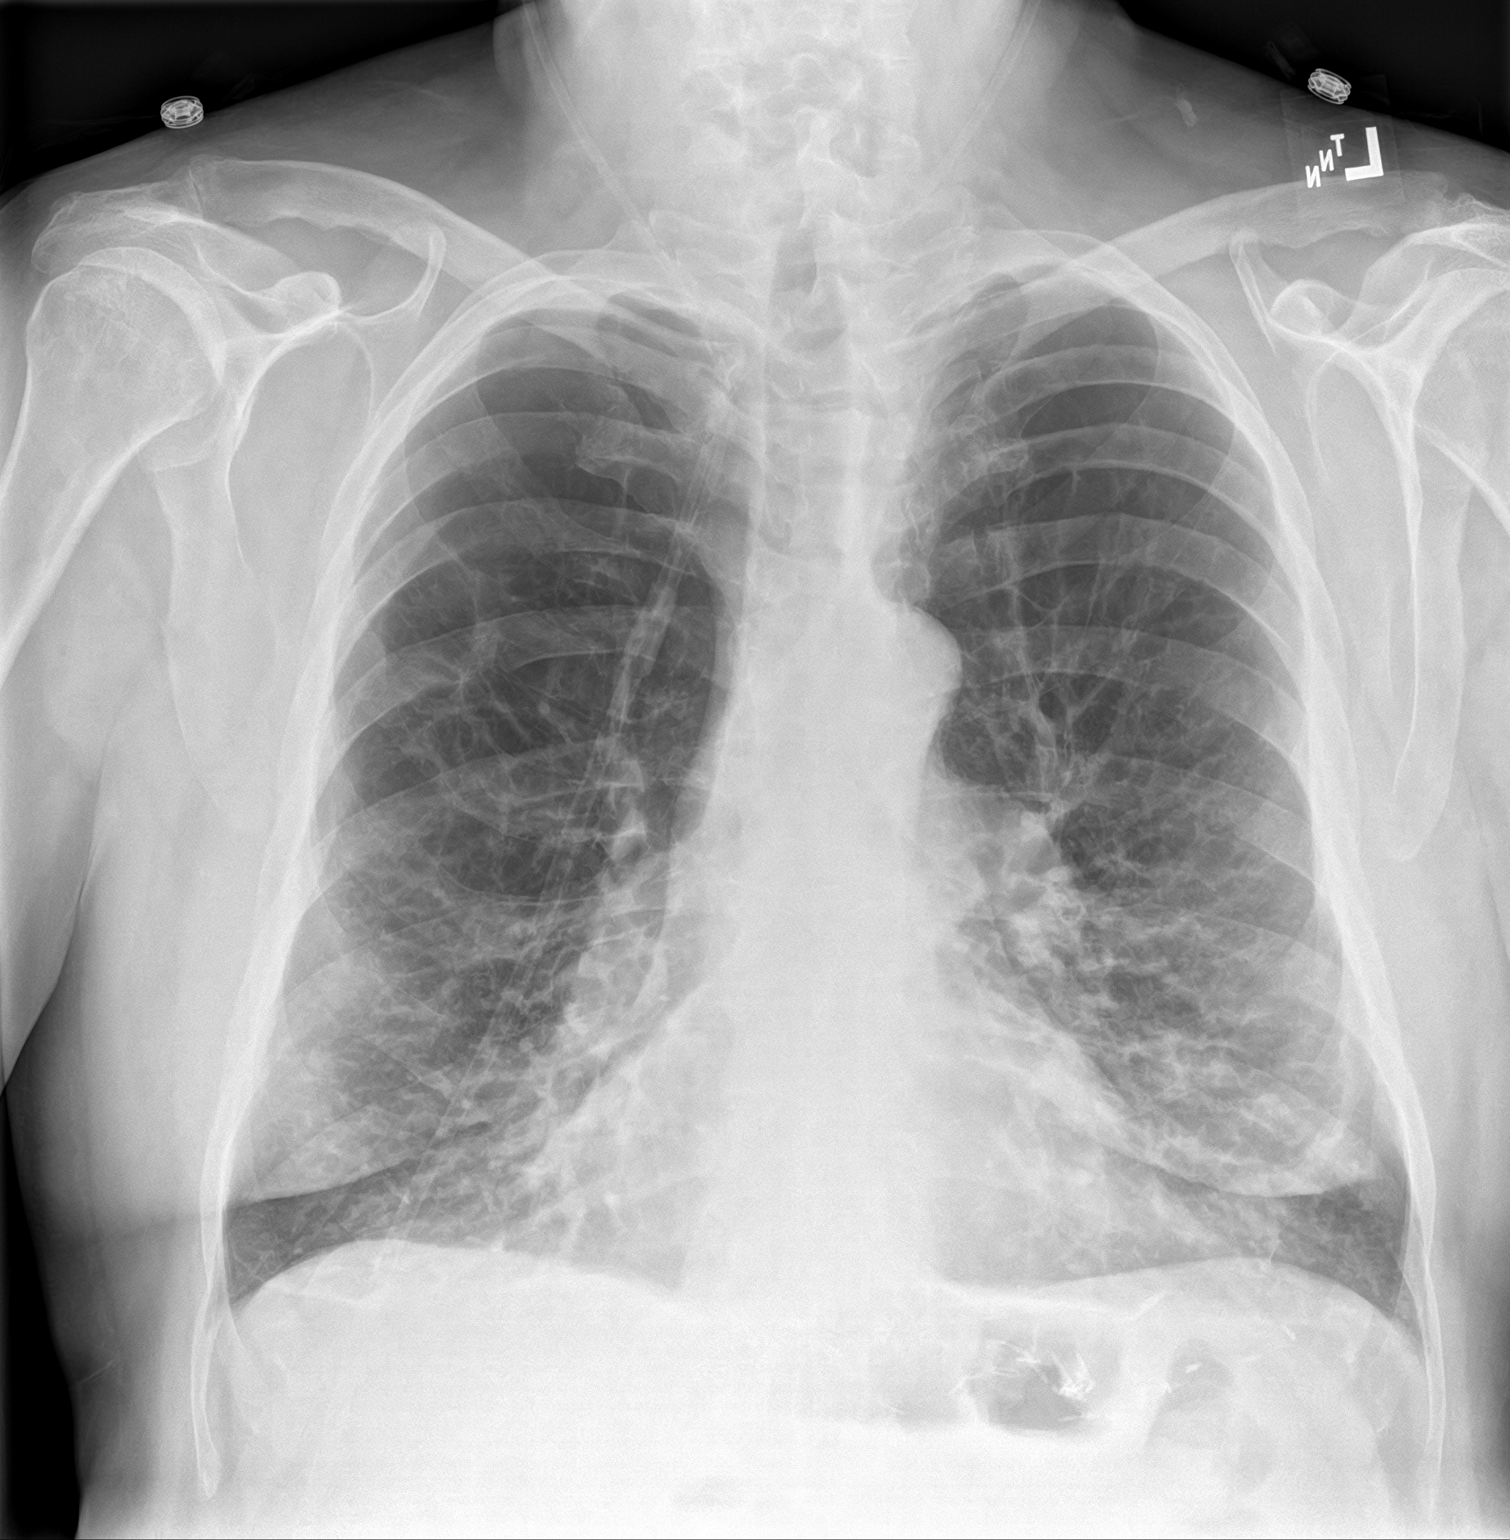

[chest lat]
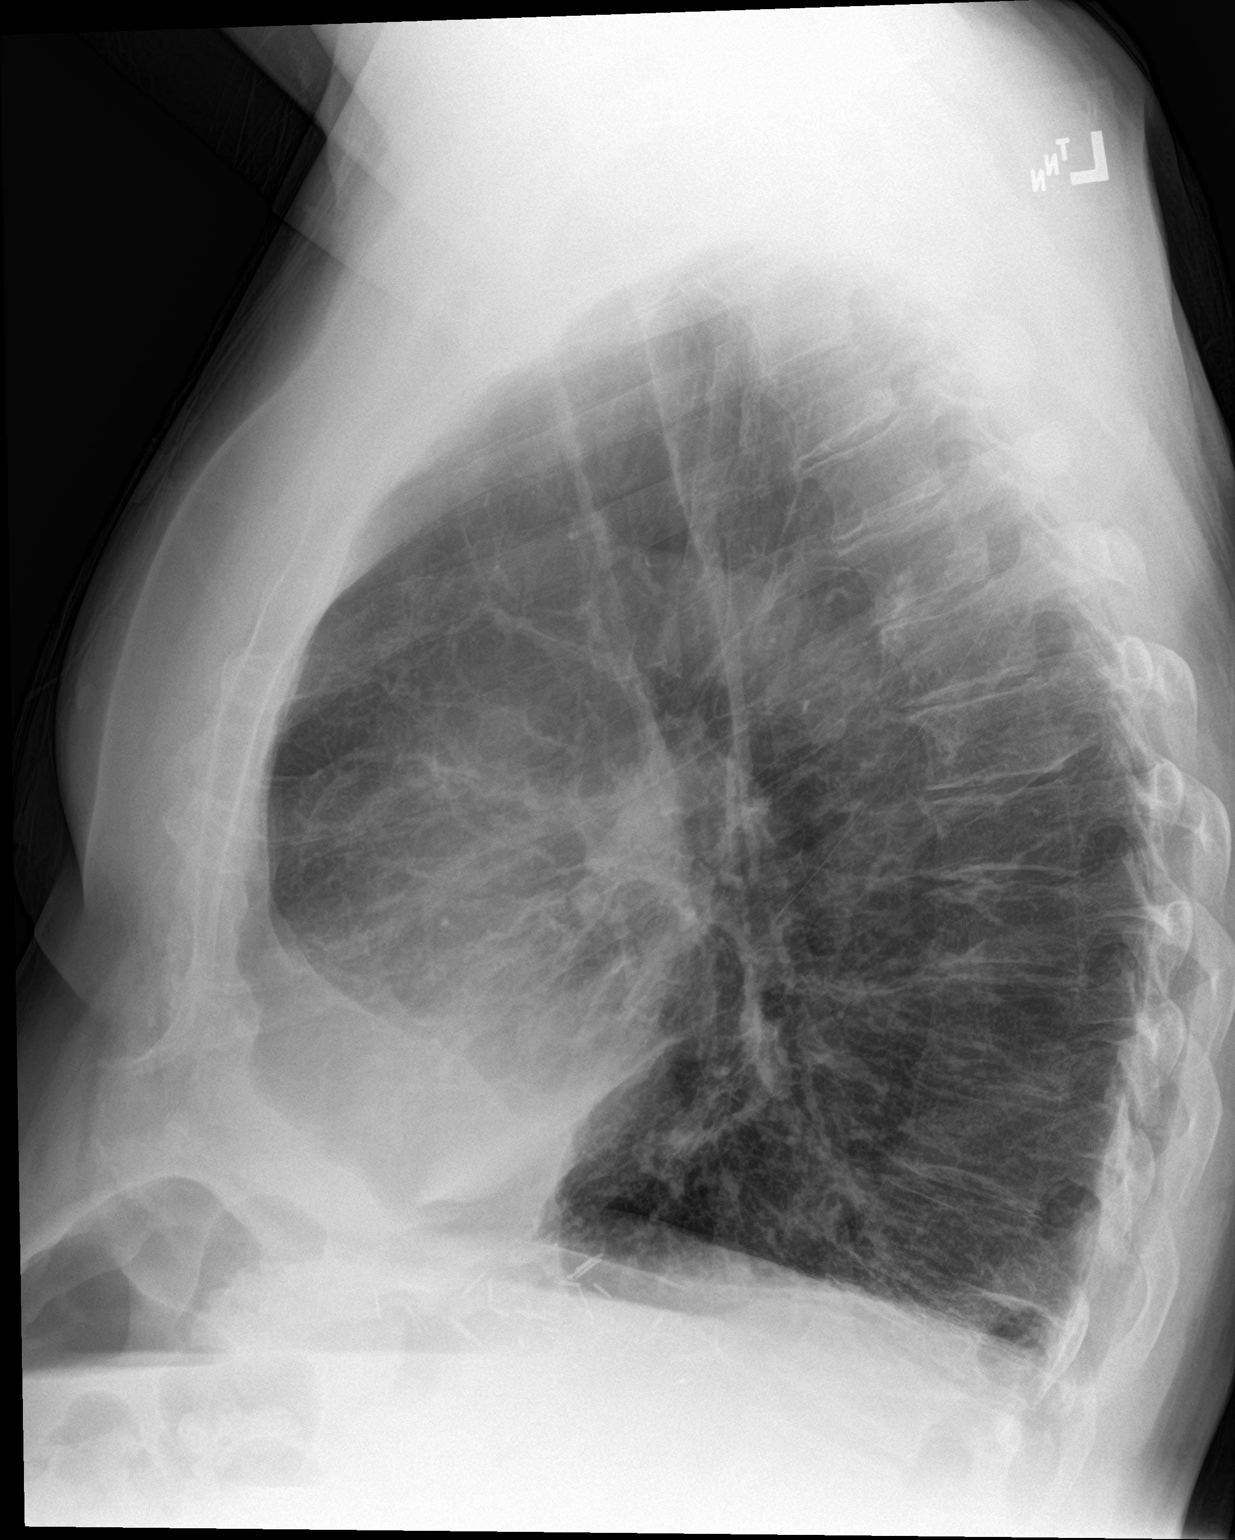

[2 of 2 positions shown; findings below may reference images not displayed]

FINDINGS: COPD changes with hyperinflation and bullous disease in the upper
lobes. Increased lung markings in the lower lobes have improved
since prior study, likely representing improving edema. No
effusions. Heart is normal size. No acute bony abnormality.
IMPRESSION: Improving pulmonary edema pattern.

Bullous emphysematous changes.

## 2019-07-09 MED ORDER — METHYLPREDNISOLONE SODIUM SUCC 125 MG IJ SOLR
60.0000 mg | Freq: Two times a day (BID) | INTRAMUSCULAR | Status: DC
  Administered 2019-07-09: 60 mg via INTRAVENOUS
  Filled 2019-07-09: qty 2

## 2019-07-09 NOTE — Progress Notes (Signed)
Called [employer] requested resting hand splint for rt hand. Spoke with [employer]. Splint will be delivered today

## 2019-07-09 NOTE — Evaluation (Signed)
Physical Therapy Evaluation Patient Details Name: Dustin [name] MRN: [id number] DOB: 10-22-1955 Today's Date: 07/09/2019   History of Present Illness  Pt is a 63 y/o M admitted for COPD exacerbation. PMH includes CHF, RF, COPD, depression, emphysema, HTN, PE, DVT.   Clinical Impression  Pt is a pleasant 63 year old M who was admitted for COPD exacerbation. Upon entry, pt is resting in bed with SpO2 at 88% with 2 L/min O2 via [place]. Pt performs bed mobility, transfers, and ambulation with min guard/supervision SpO2 monitored throughout tx; dropped to 79% following bout of amb. When asked, pt stated that he felt increased work of breathing, but did not appear to know the extent of the drop. Pt demonstrates deficits with activity tolerance, strength, and mobility. Pt will benefit from skilled PT to address above deficits; current follow-up care recommendation is HHPT.      Follow Up Recommendations Home health PT    Equipment Recommendations  None recommended by PT    Recommendations for Other Services       Precautions / Restrictions Precautions Precautions: None Restrictions Weight Bearing Restrictions: No      Mobility  Bed Mobility Overal bed mobility: Needs Assistance Bed Mobility: Supine to Sit     Supine to sit: Min guard     General bed mobility comments: Pt reaches for PT hand when coming up to EOB, but could perform without or with use of bedrails. Able to perform quickly and confidently  Transfers Overall transfer level: Needs assistance Equipment used: Rolling walker (2 wheeled) Transfers: Sit to/from Stand Sit to Stand: Supervision         General transfer comment: Able to transfer with use of RW confidently and without LOB or signs of instability  Ambulation/Gait Ambulation/Gait assistance: Min guard Gait Distance (Feet): 20 Feet Assistive device: Rolling walker (2 wheeled) Gait Pattern/deviations: Step-through pattern;Decreased stride length;Trunk  flexed;Wide base of support     General Gait Details: Pt amb with increased base of support. Able to reach recliner; amb is limited by SpO2 reaching 79 following bout to chair at 4L O2  Stairs            Wheelchair Mobility    Modified Rankin (Stroke Patients Only)       Balance Overall balance assessment: Needs assistance(Able to sit and stand upright) Sitting-balance support: Feet supported Sitting balance-Leahy Scale: Normal     Standing balance support: Bilateral upper extremity supported Standing balance-Leahy Scale: Good                               Pertinent Vitals/Pain Pain Assessment: No/denies pain    Home Living Family/patient expects to be discharged to:: Private residence Living Arrangements: Alone Available Help at Discharge: Family;Available PRN/intermittently Type of Home: Apartment Home Access: Level entry     Home Layout: One level Home Equipment: Walker - 4 wheels;Wheelchair - power Additional Comments: Pt amb household distances using rollator. Manual wc used "to keep up" and for long distances outside the home.    Prior Function Level of Independence: Independent with assistive device(s)         Comments: Pt reports he is independent in ADL's, but feels he would benefit from some help with walking since he is afraid of falling when attempting showering. Pt's daughter checks in intermittently and helps with some iADL's including laundry and shopping     Hand Dominance  Extremity/Trunk Assessment   Upper Extremity Assessment Upper Extremity Assessment: Overall WFL for tasks assessed    Lower Extremity Assessment Lower Extremity Assessment: Generalized weakness       Communication   Communication: No difficulties  Cognition Arousal/Alertness: Awake/alert Behavior During Therapy: WFL for tasks assessed/performed Overall Cognitive Status: Within Functional Limits for tasks assessed                                         General Comments General comments (skin integrity, edema, etc.): SpO2 monitored throughout tx; at entry pt at 88% on 2 L/min via [place]. Bumped up to 4L for ambulation. SpO2 remains within 80-90%. Pt is aware of increased difficulty breathing with activity, but doesn't sense drops in O2/realize severity.    Exercises Other Exercises Other Exercises: Supine, 10 reps, bilateral: AP, QS, HS; PT supervision Other Exercises: Pt independent in use of urinal at EOB.    Assessment/Plan    PT Assessment Patient needs continued PT services  PT Problem List Decreased strength;Decreased activity tolerance;Decreased balance;Cardiopulmonary status limiting activity       PT Treatment Interventions DME instruction;Gait training;Functional mobility training;Therapeutic activities;Therapeutic exercise;Balance training;Stair training;Patient/family education    PT Goals (Current goals can be found in the Care Plan section)  Acute Rehab PT Goals Patient Stated Goal: to [place] and [place] PT Goal Formulation: With patient Time For Goal Achievement: 07/23/19 Potential to Achieve Goals: Good    Frequency Min 2X/week   Barriers to discharge        Co-evaluation               AM-PAC PT "6 Clicks" Mobility  Outcome Measure Help needed turning from your back to your side while in a flat bed without using bedrails?: None Help needed moving from lying on your back to sitting on the side of a flat bed without using bedrails?: None Help needed moving to and from a bed to a chair (including a wheelchair)?: A Little Help needed standing up from a chair using your arms (e.g., wheelchair or bedside chair)?: A Little Help needed to walk in hospital room?: A Little Help needed climbing 3-5 steps with a railing? : A Little 6 Click Score: 20    End of Session Equipment Utilized During Treatment: Gait belt;Oxygen Activity Tolerance: Other (comment)(Pt limited by  SpO2/breathing) Patient left: in chair;with call bell/phone within reach;with chair alarm set Nurse Communication: Mobility status PT Visit Diagnosis: Unsteadiness on feet (R26.81);Muscle weakness (generalized) (M62.81)    Time: 5361-4431 PT Time Calculation (min) (ACUTE ONLY): 29 min   Charges:              Dixie Dials, SPT   Avry Roedl 07/09/2019, 11:55 AM

## 2019-07-09 NOTE — Plan of Care (Signed)
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
## 2019-07-09 NOTE — Progress Notes (Signed)
Cardiovascular and Pulmonary Nurse Navigator Note:    13:30 p.m. -  NOTE:  Attempted ReDS Clip reading x 4 with patient sitting up in the recliner.   "Low quality" message obtained each time.  Dr. Brett Albino informed.    63 year old male with known hx of COPD on home oxygen at 2 liters via [place], chronic diastolic HF with EF of 50 to 55%, GERD, pneumonia in the past, hypertension, left leg deep vein thrombosis, pulmonary embolism on Eliquis, pulmonary hypertension, tobacco abuse who presented to the ED with c/o SOB.   Patient is a former smoker.    Patient admitted with acute COPD exacerbation.  Patient's home lasix dose is currently being held due to increased creatinine (1.29) and BUN 35 today.   11:00 a.m.  - This RN obtained weight on standing scale of 228.2.  Breakfast tray was delivered.  Dr. Brett Albino asked this RN to obtain ReDS Clip reading.  This RN will attempt ReDS Clip reading after patient eats breakfast.    Given patient's dx of COPD and chronic diastolic HF, this RN discussed Pulmonary Rehab with Dr. Brett Albino. Referral entered.   Physical Therapy is recommending Home Health PT upon discharge.  Pulmonary Rehab upon completion of Home Health PT.    Roanna Epley, RN, BSN, [facility]  Cardiovascular & Pulmonary Nurse Navigator  Direct Line: [phone]  Department Phone #: [phone] Fax: [phone]  Email Address: [email or web address]

## 2019-07-09 NOTE — Progress Notes (Signed)
[place] [place] NAME: Dustin [name]    MR#:  [id number]  DATE OF BIRTH:  1956/07/22  SUBJECTIVE:   Patient feels like his breathing is better today.  He is still getting winded whenever he moves around in the bed.  He endorses edema in his lower extremities and stomach, but states that it is not any worse than usual.  REVIEW OF SYSTEMS:  Review of Systems  Constitutional: Negative for chills and fever.  HENT: Negative for congestion and sore throat.   Eyes: Negative for blurred vision and double vision.  Respiratory: Positive for cough, sputum production, shortness of breath and wheezing.   Cardiovascular: Negative for chest pain and palpitations.  Gastrointestinal: Negative for nausea and vomiting.  Genitourinary: Negative for dysuria and urgency.  Musculoskeletal: Negative for back pain and neck pain.  Neurological: Negative for dizziness and headaches.  Psychiatric/Behavioral: Negative for depression. The patient is not nervous/anxious.    DRUG ALLERGIES:   Allergies  Allergen Reactions  . Asa [Aspirin] Other (See Comments)    Reaction: swelling of the right side.  . Bee Venom Swelling  . Codeine Other (See Comments)    Reaction: Difficulty breathing  . Ibuprofen Other (See Comments)    Reaction: Swelling of the right side.  . Iodinated Diagnostic Agents Other (See Comments)    Reaction:  Unknown  Other reaction(s): Unknown   VITALS:  Blood pressure 111/76, pulse 81, temperature 98 F (36.7 C), temperature source Oral, resp. rate 18, height 5\' 10"  (1.778 m), weight 103.5 kg, SpO2 93 %. PHYSICAL EXAMINATION:  Physical Exam  GENERAL:  Laying in the bed with no acute distress.  HEENT: Head atraumatic, normocephalic. Pupils equal, round, reactive to light and accommodation. No scleral icterus. Extraocular muscles intact. Oropharynx and nasopharynx clear.  NECK:  Supple, no jugular venous distention. No thyroid  enlargement. LUNGS: + Diminished breath sounds throughout all lung fields.  Scattered expiratory wheezing present.  Able to speak in full sentences. No use of accessory muscles of respiration.  CARDIOVASCULAR: RRR, S1, S2 normal. No murmurs, rubs, or gallops.  ABDOMEN: Soft, nontender, nondistended. Bowel sounds present.  EXTREMITIES: No cyanosis, or clubbing. Chronic-appearing bilateral pitting edema in the lower extremities. NEUROLOGIC: CN 2-12 intact, no focal deficits. 5/5 muscle strength throughout all extremities. Sensation intact throughout. Gait not checked.  PSYCHIATRIC: The patient is alert and oriented x 3.  SKIN: No obvious rash, lesion, or ulcer.  LABORATORY PANEL:  Male CBC Recent Labs  Lab 07/09/19 0338  WBC 16.8*  HGB 12.1*  HCT 39.4  PLT 150   ------------------------------------------------------------------------------------------------------------------ Chemistries  Recent Labs  Lab 07/09/19 0338  NA 143  K 4.7  CL 99  CO2 34*  GLUCOSE 158*  BUN 35*  CREATININE 1.29*  CALCIUM 9.4   RADIOLOGY:  Dg Chest 1 View  Result Date: 07/09/2019 CLINICAL DATA:  Shortness of breath EXAM: CHEST  1 VIEW COMPARISON:  07/07/2019 FINDINGS: Heart is borderline in size. Bibasilar airspace opacities are again noted, unchanged. No effusions. No acute bony abnormality. Mild vascular congestion. IMPRESSION: Bibasilar atelectasis or infiltrates, unchanged since prior study. Electronically Signed   By: Rolm Baptise M.D.   On: 07/09/2019 09:48   ASSESSMENT AND PLAN:   Acute exacerbation of COPD with a history of chronic hypoxic respiratory failure- patient is stable on his home 2 L O2. -Taper IV Solu-Medrol to 60 mg twice daily -Scheduled duo nebs -Continue doxycycline -Continue supplemental oxygen -Sputum culture pending -  Continue home inhalers -PT eval pending  Chronic diastolic congestive heart failure- patient does not appear volume overloaded, although he does have  some chronic lower extremity edema bilaterally. -Repeat CXR this morning without pulmonary edema -BNP was mildly elevated -Attempted to place Reds vest on patient today, but it was unsuccessful x 4 -Will hold Lasix for another day and will likely restart tomorrow -Daily weights, strict I/O -Daily BMP  Leukocytosis-likely due to steroids.  No signs of infection. -On doxycycline for COPD exacerbation, as above -Recheck CBC in the morning  History of DVT/PE -Continue home Eliquis  Macrocytic anemia- hemoglobin close to baseline -Vitamin B12 and folate were normal  Tobacco use -Nicotine patch as needed  Sister, [place] Citizen, updated via the phone.  All the records are reviewed and case discussed with Care Management/Social Worker. Management plans discussed with the patient, family and they are in agreement.  CODE STATUS: Full Code  TOTAL TIME TAKING CARE OF THIS PATIENT: 40 minutes.   More than 50% of the time was spent in counseling/coordination of care: YES  POSSIBLE D/C IN 1-2 DAYS, DEPENDING ON CLINICAL CONDITION.  Jinny Blossom Mayo M.D on 07/09/2019 at 2:20 PM  Between 7am to 6pm - Pager [phone]  After 6pm go to [email or web address] - Social research officer, government  [facility] [place] [facility]  Office  [phone]  CC: Primary care physician; McLean-Scocuzza, Pasty Spillers, MD  Note: This dictation was prepared with Dragon dictation along with smaller phrase technology. Any transcriptional errors that result from this process are unintentional.

## 2019-07-10 ENCOUNTER — Telehealth: Payer: Self-pay | Admitting: Internal Medicine

## 2019-07-10 ENCOUNTER — Other Ambulatory Visit: Payer: Self-pay | Admitting: *Deleted

## 2019-07-10 LAB — BASIC METABOLIC PANEL
Anion gap: 6 (ref 5–15)
BUN: 36 mg/dL — ABNORMAL HIGH (ref 8–23)
CO2: 34 mmol/L — ABNORMAL HIGH (ref 22–32)
Calcium: 9.4 mg/dL (ref 8.9–10.3)
Chloride: 102 mmol/L (ref 98–111)
Creatinine, Ser: 1.04 mg/dL (ref 0.61–1.24)
GFR calc Af Amer: >60 mL/min (ref >60)
GFR calc non Af Amer: >60 mL/min (ref >60)
Glucose, Bld: 140 mg/dL — ABNORMAL HIGH (ref 70–99)
Potassium: 5 mmol/L (ref 3.5–5.1)
Sodium: 142 mmol/L (ref 135–145)

## 2019-07-10 LAB — CBC
HCT: 40 % (ref 39.0–52.0)
Hemoglobin: 12.5 g/dL — ABNORMAL LOW (ref 13.0–17.0)
MCH: 32.1 pg (ref 26.0–34.0)
MCHC: 31.3 g/dL (ref 30.0–36.0)
MCV: 102.8 fL — ABNORMAL HIGH (ref 80.0–100.0)
Platelets: 182 10*3/uL (ref 150–400)
RBC: 3.89 MIL/uL — ABNORMAL LOW (ref 4.22–5.81)
RDW: 13.1 % (ref 11.5–15.5)
WBC: 15 10*3/uL — ABNORMAL HIGH (ref 4.0–10.5)
nRBC: 0 % (ref 0.0–0.2)

## 2019-07-10 LAB — HEMOGLOBIN A1C
Hgb A1c MFr Bld: 5.6 % (ref 4.8–5.6)
Mean Plasma Glucose: 114.02 mg/dL

## 2019-07-10 MED ORDER — PREDNISONE 50 MG PO TABS: 50.0000 mg | ORAL_TABLET | Freq: Every day | ORAL | 0 refills | Status: DC

## 2019-07-10 MED ORDER — DOXYCYCLINE HYCLATE 100 MG PO TABS: 100.0000 mg | ORAL_TABLET | Freq: Every day | ORAL | 0 refills | Status: AC

## 2019-07-10 MED ORDER — PREDNISONE 50 MG PO TABS
50.0000 mg | ORAL_TABLET | Freq: Every day | ORAL | Status: DC
  Administered 2019-07-10: 08:00:00 50 mg via ORAL
  Filled 2019-07-10: qty 1

## 2019-07-10 MED ORDER — FUROSEMIDE 40 MG PO TABS
40.0000 mg | ORAL_TABLET | Freq: Two times a day (BID) | ORAL | Status: DC
  Administered 2019-07-10: 08:00:00 40 mg via ORAL
  Filled 2019-07-10: qty 1

## 2019-07-10 NOTE — Patient Outreach (Signed)
[facility]) Care Management  07/10/2019  DRAXTON LUU December 27, 1955 [id number]   This encounter was created in error - please disregard.  Joylene Draft, RN, [place] Management Coordinator  [phone]- Mobile [phone]- Toll Free Main Office

## 2019-07-10 NOTE — Discharge Instructions (Signed)
It was so nice to meet you during this hospitalization!  You came into the hospital with shortness of breath. We think you had an exacerbation, or worsening, of your COPD.  I have prescribed the following medications: 1. Please take Doxycycline 100mg  tomorrow morning to finish your 5 day course of antibiotics. 2. Please take Prednisone 50mg  tomorrow morning to finish your 5 day course of steroids.  Take care, Dr. Brett Albino

## 2019-07-10 NOTE — Telephone Encounter (Signed)
Pt does not need to see me for this  Please make sure he sees pulmonary not PCP Call and and make appt in person asap [facility] pulm Dr. Patsey Berthold    [place]

## 2019-07-10 NOTE — Progress Notes (Signed)
Cardiovascular and Pulmonary Nurse Navigator Note:    Rounded on patient.  Patient for discharge today.  Discussed with patient the referral to [facility].  Patient stated he feels like he has been seen in the HF Clinic once before.  Purpose of the clinic reviewed with patient. Informed patient that being followed in this clinic does not replace PCP nor cardiologist, but serves as an additional resource to assist with managing his heart failure.    Given patient's dx of COPD and Diastolic HF, patient is a candidate for Pulmonary Rehab.  Overview of the program provided.  Brochure and informational letter provided.  Patient stated his parking space where he lives is not close to his apartment.  This makes it more difficult for him to have the energy or breath to drive to any appointments.  As a result, patient has declined to participate at this time. Patient did request information so if he decides later that he would like to participate.  Information provided.    Patient appreciated of the above information.     Roanna Epley, RN, BSN, [place] [facility]  Cardiovascular & Pulmonary Nurse Navigator  Direct Line: [phone]  Department Phone #: [phone] Fax: [phone]  Email Address: [email or web address]

## 2019-07-10 NOTE — TOC Progression Note (Signed)
Transition of Care [facility]) - Progression Note    Patient Details  Name: TAYVIEN KANE MRN: [id number] Date of Birth: 02/12/1956  Transition of Care [facility]) CM/SW [place], [name] Phone Number: 07/10/2019, 10:15 AM  Clinical Narrative: [facility], [facility], [facility], and [facility] are unable to accept home health referral. Waiting on response from [facility].    Expected Discharge Plan: [place]    Expected Discharge Plan and Services Expected Discharge Plan: [place] Choice: [place] arrangements for the past 2 months: Apartment                                       Social Determinants of Health (SDOH) Interventions    Readmission Risk Interventions No flowsheet data found.

## 2019-07-10 NOTE — Telephone Encounter (Signed)
HFU/ Pt is being discharged from [facility] today to home/ Dx was COPD/ Pt is scheduled for 07/16/2019 @ 11:30am. Is pt coming in or virtual or phone? Thank you!

## 2019-07-10 NOTE — Care Management Important Message (Signed)
Important Message  Patient Details  Name: Dustin [name] MRN: [id number] Date of Birth: 1956/04/14   Medicare Important Message Given:  Yes     [name] [name] 07/10/2019, 10:35 AM

## 2019-07-10 NOTE — Evaluation (Signed)
Occupational Therapy Evaluation Patient Details Name: Dustin [name] MRN: [id number] DOB: 17-Dec-1955 Today's Date: 07/10/2019    History of Present Illness Pt is a 63 y/o M admitted for COPD exacerbation. PMH includes CHF, RF, COPD, depression, emphysema, HTN, PE, DVT.    Clinical Impression   Pt seen for OT evaluation this date. Pt was independent in all ADLs and mobility, living in a 1 story home by himself. Pt on 2 liters of O2 at home. No falls in past 12 months but pt endorses fear of falling. Pt typically ambulates with a rollator short household distances and performs basic ADL with modified independence. Drives. Pt reports becoming easily fatigued or out of breath with minimize exertion. Pt currently requires minimal assist for LB ADL and CGA for functional mobility due to poor activity tolerance. On 2L, pt ambulated approx 10 ft from EOB to bathroom with RW and O2 sats dropped to high 70's. Cues for PLB and O2 increased to 4L with pt able to recover within 2 minutes to high 80's to 90% on 4L. Back on 2L at end of session in bed. RN notified. Pt educated in energy conservation conservation strategies including pursed lip breathing, activity pacing, home/routines modifications, work simplification, AE/DME, prioritizing of meaningful occupations, and falls prevention. Handout provided. Handout provided. Pt verbalized understanding but would benefit from additional skilled OT services to maximize recall and carryover of learned techniques and facilitate implementation of learned techniques into daily routines. Upon discharge, recommend [place] services. RNCM notified.    Follow Up Recommendations  Home health OT    Equipment Recommendations  Toilet riser    Recommendations for Other Services       Precautions / Restrictions Precautions Precautions: Fall Precaution Comments: monitor O2 sats with exertion Restrictions Weight Bearing Restrictions: No      Mobility Bed Mobility Overal  bed mobility: Needs Assistance Bed Mobility: Supine to Sit;Sit to Supine     Supine to sit: Min guard Sit to supine: Min guard   General bed mobility comments: increased effort  Transfers Overall transfer level: Needs assistance Equipment used: Rolling walker (2 wheeled) Transfers: Sit to/from Stand Sit to Stand: Supervision              Balance Overall balance assessment: Needs assistance Sitting-balance support: Feet supported Sitting balance-Leahy Scale: Normal     Standing balance support: Bilateral upper extremity supported Standing balance-Leahy Scale: Good                             ADL either performed or assessed with clinical judgement   ADL Overall ADL's : Needs assistance/impaired                                       General ADL Comments: Pt requires CGA for toileting, Min A for LB ADL, cues for PLB to improve O2 sats     Vision Baseline Vision/History: Wears glasses Wears Glasses: At all times Patient Visual Report: No change from baseline       Perception     Praxis      Pertinent Vitals/Pain Pain Assessment: No/denies pain     Hand Dominance Right   Extremity/Trunk Assessment Upper Extremity Assessment Upper Extremity Assessment: Overall WFL for tasks assessed   Lower Extremity Assessment Lower Extremity Assessment: Generalized weakness       Communication Communication Communication:  No difficulties   Cognition Arousal/Alertness: Awake/alert Behavior During Therapy: WFL for tasks assessed/performed Overall Cognitive Status: Within Functional Limits for tasks assessed                                     General Comments  O2 sats 91% at rest on 2L, increased to 4L O2 with ambulation to bathroom with desat into high 70's briefly and recovering quickly to high 80's with PLB. On 2L at end of session in bed at 91%; RN notified    Exercises Other Exercises Other Exercises: Pt instructed  in energy conservation strategies, falls prevention, AE/DME, and home/routines modifications to maximize safety/independence; handout provided   Shoulder Instructions      Home Living Family/patient expects to be discharged to:: Private residence Living Arrangements: Alone Available Help at Discharge: Family;Available PRN/intermittently Type of Home: Apartment Home Access: Level entry     Home Layout: One level     Bathroom Shower/Tub: Chief Strategy Officer: Standard     Home Equipment: Environmental consultant - 4 wheels;Wheelchair - power;Grab bars - toilet;Grab bars - tub/shower;Adaptive equipment Adaptive Equipment: Reacher;Long-handled shoe horn Additional Comments: Pt amb household distances using rollator. Manual wc used "to keep up" and for long distances outside the home.      Prior Functioning/Environment Level of Independence: Independent with assistive device(s)        Comments: Pt reports he is independent in ADL's, but feels he would benefit from some help with walking since he is afraid of falling when attempting showering. Pt's daughter checks in intermittently and helps with some iADL's including laundry and shopping        OT Problem List: Decreased strength;Cardiopulmonary status limiting activity;Decreased activity tolerance;Decreased knowledge of use of DME or AE      OT Treatment/Interventions: Self-care/ADL training;Therapeutic activities;Therapeutic exercise;Energy conservation;DME and/or AE instruction;Patient/family education    OT Goals(Current goals can be found in the care plan section) Acute Rehab OT Goals Patient Stated Goal: to return home and [place] OT Goal Formulation: With patient Time For Goal Achievement: 07/24/19 Potential to Achieve Goals: Good ADL Goals Pt Will Perform Lower Body Dressing: with supervision;sit to/from stand;with adaptive equipment(with O2 sats >90%) Pt Will Transfer to Toilet: with  supervision;ambulating(elevated commode, with O2 sats >90%) Additional ADL Goal #1: Pt will verbalize plan to implement at least 1 learned energy conservation strategy.  OT Frequency: Min 1X/week   Barriers to D/C:            Co-evaluation              AM-PAC OT "6 Clicks" Daily Activity     Outcome Measure Help from another person eating meals?: None Help from another person taking care of personal grooming?: None Help from another person toileting, which includes using toliet, bedpan, or urinal?: A Little Help from another person bathing (including washing, rinsing, drying)?: A Little Help from another person to put on and taking off regular upper body clothing?: None Help from another person to put on and taking off regular lower body clothing?: A Little 6 Click Score: 21   End of Session Equipment Utilized During Treatment: Gait belt;Rolling walker;Oxygen Nurse Communication: Other (comment)(O2 sats during exertion)  Activity Tolerance: Patient tolerated treatment well Patient left: in bed;with call bell/phone within reach;with bed alarm set  OT Visit Diagnosis: Other abnormalities of gait and mobility (R26.89)  Time: [id number] OT Time Calculation (min): 47 min Charges:  OT General Charges $OT Visit: 1 Visit OT Evaluation $OT Eval Low Complexity: 1 Low OT Treatments $Self Care/Home Management : 23-37 mins $Therapeutic Activity: 8-22 mins  Richrd PrimeJamie Stiller, MPH, MS, OTR/L ascom [phone] 07/10/19, 10:51 AM

## 2019-07-10 NOTE — Discharge Summary (Signed)
[facility] [place] [facility]   PATIENT NAME: Dustin [name]    MR#:  [id number]  DATE OF BIRTH:  11-Sep-1956  DATE OF ADMISSION:  07/07/2019   ADMITTING PHYSICIAN: Ihor Austin, MD  DATE OF DISCHARGE: 07/10/19  PRIMARY CARE PHYSICIAN: McLean-Scocuzza, Pasty Spillers, MD   ADMISSION DIAGNOSIS:  COPD exacerbation (HCC) [J44.1] Acute on chronic respiratory failure with hypoxia (HCC) [J96.21] DISCHARGE DIAGNOSIS:  Active Problems:   COPD exacerbation (HCC)  SECONDARY DIAGNOSIS:   Past Medical History:  Diagnosis Date  . Allergy   . Anxiety   . Asthma   . Chronic diastolic CHF (congestive heart failure) (HCC)    a. echo 07/2013: EF 60-65%, DD, biatrial dilatation, Ao sclerosis, dilated RV, moderate pulmonary HTN, elevated CV and RA pressures; b. patient reported echo at Dr. Milta Deiters office 02/2015 - his office does not have record of him being a pt there c. echo 11/2015: EF 60-65%, Grade 1 DD, mod-severe pulm pressures  . Chronic respiratory failure (HCC)    a. on 2L via nasal cannula; b. secondary to COPD  . COPD (chronic obstructive pulmonary disease) (HCC)    on 2L continuous   . Depression   . Emphysema of lung (HCC)   . GERD (gastroesophageal reflux disease)   . HCAP (healthcare-associated pneumonia)    01/22/18-01/28/18   . Headache   . Hypertension   . Leg DVT (deep venous thromboembolism), acute, bilateral (HCC)    02/26/18  . Personal history of tobacco use, presenting hazards to health 08/17/2015  . Pulmonary embolism (HCC)    02/26/18  . Pulmonary HTN (HCC)   . Tobacco abuse    HOSPITAL COURSE:   Dustin [name] is a 63 year old male who presented to the ED with shortness of breath and wheezing.  In the ED,  chest x-ray was unremarkable.  He was felt to have a COPD exacerbation.  He was admitted for further management.  Acute exacerbation of COPD with a history of chronic hypoxic respiratory failure- patient is stable on his home 2 L O2. -Initially  treated with Solu-Medrol and then transitioned to prednisone for total 5-day course -Treated with a 5-day course of doxycycline -Home inhalers were continued -Patient will follow-up with PCP and pulmonologist as an outpatient -PT/OT recommended home health, but there was not a home health agency that was able to take the patient  Chronic diastolic congestive heart failure- patient did not appear volume overloaded this admission. -Home Lasix was continued -Patient will follow up with cardiology as an outpatient -We were unable to get patient set up with home health heart failure protocol due to not enough staffing.  Can consider getting patient set up for this in the future.  Leukocytosis- likely due to steroids.  No signs of infection. -On doxycycline for COPD exacerbation, as above -Recheck CBC as an outpatient  History of DVT/PE -Continued home Eliquis  Macrocytic anemia- hemoglobin close to baseline -Vitamin B12 and folate were normal  Tobacco use -Tobacco cessation counseling performed this admission  Hyperglycemia-likely secondary to steroid use -A1c was pending at the time of discharge  DISCHARGE CONDITIONS:  Acute exacerbation of COPD Chronic respiratory failure on 2 L O2 Chronic diastolic congestive heart failure History of DVT/PE Macrocytic anemia Tobacco use CONSULTS OBTAINED:   DRUG ALLERGIES:   Allergies  Allergen Reactions  . Asa [Aspirin] Other (See Comments)    Reaction: swelling of the right side.  . Bee Venom Swelling  . Codeine Other (See Comments)  Reaction: Difficulty breathing  . Ibuprofen Other (See Comments)    Reaction: Swelling of the right side.  . Iodinated Diagnostic Agents Other (See Comments)    Reaction:  Unknown  Other reaction(s): Unknown   DISCHARGE MEDICATIONS:   Allergies as of 07/10/2019      Reactions   Asa [aspirin] Other (See Comments)   Reaction: swelling of the right side.   Bee Venom Swelling   Codeine Other  (See Comments)   Reaction: Difficulty breathing   Ibuprofen Other (See Comments)   Reaction: Swelling of the right side.   Iodinated Diagnostic Agents Other (See Comments)   Reaction:  Unknown  Other reaction(s): Unknown      Medication List    STOP taking these medications   mupirocin ointment 2 % Commonly known as: Bactroban     TAKE these medications   ALPRAZolam 1 MG tablet Commonly known as: XANAX Take 1 tablet (1 mg total) by mouth 3 (three) times daily as needed for anxiety.   AMBULATORY NON FORMULARY MEDICATION 1 each by Does not apply route as needed. Please provide patient with "So Clean" CPAP cleaning device to be used as needed.   antiseptic oral rinse Liqd 15 mLs by Mouth Rinse route daily as needed for dry mouth.   apixaban 5 MG Tabs tablet Commonly known as: Eliquis Take 1 tablet (5 mg total) by mouth 2 (two) times daily.   b complex vitamins capsule Take 1 capsule by mouth daily.   carbamide peroxide 6.5 % OTIC solution Commonly known as: DEBROX Place 5 drops into both ears 2 (two) times daily. Prn x 4-7 days   clobetasol 0.05 % external solution Commonly known as: TEMOVATE Apply 1 application topically 2 (two) times daily. Prn to scalp and ears   diltiazem 30 MG tablet Commonly known as: CARDIZEM Take 1 tablet (30 mg total) by mouth 3 (three) times daily as needed. What changed: reasons to take this   doxycycline 100 MG tablet Commonly known as: VIBRA-TABS Take 1 tablet (100 mg total) by mouth daily. For 1 more day. Start taking on: July 11, 2019   escitalopram 20 MG tablet Commonly known as: LEXAPRO Take 1 tablet (20 mg total) by mouth at bedtime.   fluticasone 50 MCG/ACT nasal spray Commonly known as: FLONASE Place 2 sprays into both nostrils daily as needed for allergies or rhinitis.   furosemide 40 MG tablet Commonly known as: LASIX Take 1 tablet (40 mg) by mouth twice daily What changed:   how much to take  how to take this   when to take this  additional instructions   gabapentin 300 MG capsule Commonly known as: NEURONTIN TAKE 1 TO 2 CAPSULES BY MOUTH THREE TIMES DAILY What changed:   how much to take  how to take this  when to take this  additional instructions   hydrocortisone 2.5 % cream Apply topically 2 (two) times daily as needed. face What changed:   how much to take  reasons to take this  additional instructions   ipratropium-albuterol 0.5-2.5 (3) MG/3ML Soln Commonly known as: DUONEB Take 3 mLs by nebulization every 6 (six) hours as needed. What changed: reasons to take this   ketoconazole 2 % shampoo Commonly known as: Nizoral Apply 1 application topically 2 (two) times a week. Let stand for 5 minutes   magnesium oxide 400 MG tablet Commonly known as: MAG-OX Take 400 mg by mouth daily.   mometasone-formoterol 200-5 MCG/ACT Aero Commonly known as: Surveyor, mining  2 puffs into the lungs 2 (two) times daily.   NAC 600 MG Caps Generic drug: Acetylcysteine Take 1 tablet by mouth daily.   nystatin 100000 UNIT/ML suspension Commonly known as: MYCOSTATIN Use as directed 5 mLs (500,000 Units total) in the mouth or throat 4 (four) times daily. Swish and spit 3 or 4 times daily for at least 5 days.  Then may be used as needed thereafter for thrush   OptiChamber Advantage-Lg Mask Misc Use as directed with inhaler diag  j44.1   potassium chloride 10 MEQ tablet Commonly known as: KLOR-CON Take 4 tablets (40 meq) by mouth once daily What changed:   how much to take  how to take this  when to take this  additional instructions   predniSONE 50 MG tablet Commonly known as: DELTASONE Take 1 tablet (50 mg total) by mouth daily with breakfast. For 1 more day. Start taking on: July 11, 2019   PROBIOTIC PO Take 1 capsule by mouth 2 (two) times a day.   rOPINIRole 0.25 MG tablet Commonly known as: REQUIP Take 1 tablet (0.25 mg total) by mouth 2 (two) times daily.    sodium chloride 0.65 % Soln nasal spray Commonly known as: OCEAN Place 1-2 sprays into both nostrils as needed for congestion.   theophylline 400 MG 24 hr tablet Commonly known as: UNIPHYL Take 1 tablet (400 mg total) by mouth every morning.   Tiotropium Bromide Monohydrate 2.5 MCG/ACT Aers Commonly known as: Spiriva Respimat Inhale 2 puffs into the lungs daily.   traZODone 50 MG tablet Commonly known as: DESYREL Take 1-2 tablets (50-100 mg total) by mouth at bedtime as needed for sleep.   Vitamin D 50 MCG (2000 UT) tablet Take 2,000 Units by mouth 2 (two) times a day.        DISCHARGE INSTRUCTIONS:  1.  Follow-up with PCP in 5 days 2.  Follow-up with pulmonology in 2 weeks 3.  Follow-up with cardiology in 2 weeks 4.  Take prednisone 50 mg and doxycycline 100 mg tomorrow to complete a 5-day course of both of these medicines DIET:  Cardiac diet DISCHARGE CONDITION:  Stable ACTIVITY:  Activity as tolerated OXYGEN:  Home Oxygen: Yes.    Oxygen Delivery: 2 liters/min via Patient connected to nasal cannula oxygen DISCHARGE LOCATION:  home   If you experience worsening of your admission symptoms, develop shortness of breath, life threatening emergency, suicidal or homicidal thoughts you must seek medical attention immediately by calling 911 or calling your MD immediately  if symptoms less severe.  You Must read complete instructions/literature along with all the possible adverse reactions/side effects for all the Medicines you take and that have been prescribed to you. Take any new Medicines after you have completely understood and accpet all the possible adverse reactions/side effects.   Please note  You were cared for by a hospitalist during your hospital stay. If you have any questions about your discharge medications or the care you received while you were in the hospital after you are discharged, you can call the unit and asked to speak with the hospitalist on call if  the hospitalist that took care of you is not available. Once you are discharged, your primary care physician will handle any further medical issues. Please note that NO REFILLS for any discharge medications will be authorized once you are discharged, as it is imperative that you return to your primary care physician (or establish a relationship with a primary care physician if you do  not have one) for your aftercare needs so that they can reassess your need for medications and monitor your lab values.    On the day of Discharge:  VITAL SIGNS:  Blood pressure 105/69, pulse 70, temperature 97.8 F (36.6 C), temperature source Oral, resp. rate 19, height 5\' 10"  (1.778 m), weight 103.9 kg, SpO2 93 %. PHYSICAL EXAMINATION:  GENERAL:  63 y.o.-year-old patient lying in the bed with no acute distress.  EYES: Pupils equal, round, reactive to light and accommodation. No scleral icterus. Extraocular muscles intact.  HEENT: Head atraumatic, normocephalic. Oropharynx and nasopharynx clear.  NECK:  Supple, no jugular venous distention. No thyroid enlargement, no tenderness.  LUNGS: + Scattered wheezing present throughout all lung fields.  Nasal cannula in place.  Able to speak in full sentences. No use of accessory muscles of respiration.  CARDIOVASCULAR: RRR, S1, S2 normal. No murmurs, rubs, or gallops.  ABDOMEN: Soft, non-tender, non-distended. Bowel sounds present. No organomegaly or mass.  EXTREMITIES: No cyanosis, or clubbing. + Moderate chronic appearing lower extremity edema bilaterally. NEUROLOGIC: Cranial nerves II through XII are intact. Muscle strength 5/5 in all extremities. Sensation intact. Gait not checked.  PSYCHIATRIC: The patient is alert and oriented x 3.  SKIN: No obvious rash, lesion, or ulcer.  DATA REVIEW:   CBC Recent Labs  Lab 07/10/19 0330  WBC 15.0*  HGB 12.5*  HCT 40.0  PLT 182    Chemistries  Recent Labs  Lab 07/10/19 0330  NA 142  K 5.0  CL 102  CO2 34*   GLUCOSE 140*  BUN 36*  CREATININE 1.04  CALCIUM 9.4     Microbiology Results  Results for orders placed or performed during the hospital encounter of 07/07/19  SARS CORONAVIRUS 2 (TAT 6-24 HRS) Nasopharyngeal Nasopharyngeal Swab     Status: None   Collection Time: 07/07/19  4:13 PM   Specimen: Nasopharyngeal Swab  Result Value Ref Range Status   SARS Coronavirus 2 NEGATIVE NEGATIVE Final    Comment: (NOTE) SARS-CoV-2 target nucleic acids are NOT DETECTED. The SARS-CoV-2 RNA is generally detectable in upper and lower respiratory specimens during the acute phase of infection. Negative results do not preclude SARS-CoV-2 infection, do not rule out co-infections with other pathogens, and should not be used as the sole basis for treatment or other patient management decisions. Negative results must be combined with clinical observations, patient history, and epidemiological information. The expected result is Negative. Fact Sheet for Patients: 07/09/19 Fact Sheet for Healthcare Providers: HairSlick.no This test is not yet approved or cleared by the quierodirigir.com FDA and  has been authorized for detection and/or diagnosis of SARS-CoV-2 by FDA under an Emergency Use Authorization (EUA). This EUA will remain  in effect (meaning this test can be used) for the duration of the COVID-19 declaration under Section 56 4(b)(1) of the Act, 21 U.S.C. section 360bbb-3(b)(1), unless the authorization is terminated or revoked sooner. Performed at [facility], [address] [place], [place]   Expectorated sputum assessment w rflx to resp cult     Status: None   Collection Time: 07/08/19  3:04 PM   Specimen: Sputum  Result Value Ref Range Status   Specimen Description SPU  Final   Special Requests Normal  Final   Sputum evaluation   Final    Sputum specimen not acceptable for testing.  Please recollect.   07/10/19 AT 1644 07/08/2019 KMP Performed at [facility], [address] [place],  [place] [address]    Report Status 07/08/2019 FINAL  Final  Expectorated sputum assessment w rflx to resp cult     Status: None   Collection Time: 07/09/19  6:32 AM   Specimen: Sputum  Result Value Ref Range Status   Specimen Description SPUTUM  Final   Special Requests Normal  Final   Sputum evaluation   Final    Sputum specimen not acceptable for testing.  Please recollect.   NOTIFIED MELISSA BELLOW AT 0739 ON 07/09/2019 JJB Performed at [facility], [address], [place], [place] [address]    Report Status 07/09/2019 FINAL  Final    RADIOLOGY:  No results found.   Management plans discussed with the patient, family and they are in agreement.  CODE STATUS: Full Code   TOTAL TIME TAKING CARE OF THIS PATIENT: 45 minutes.    Jinny Blossom Jaecion Dempster M.D on 07/10/2019 at 10:32 AM  Between 7am to 6pm - Pager - [phone]  After 6pm go to [email or web address] - Social research officer, government  [facility]  Office  [phone]  CC: Primary care physician; McLean-Scocuzza, Pasty Spillers, MD   Note: This dictation was prepared with Dragon dictation along with smaller phrase technology. Any transcriptional errors that result from this process are unintentional.

## 2019-07-10 NOTE — TOC Transition Note (Signed)
Transition of Care [facility]) - CM/SW Discharge Note   Patient Details  Name: CAISEN MANGAS MRN: [id number] Date of Birth: 05/29/1956  [facility] of [facility] CM/SW Contact:  Candie Chroman, [name] Phone Number: 07/10/2019, 10:39 AM   Clinical Narrative: CSW unable to find a home health agency to accept referral. CSW has notified patient of this and let him know he would have to follow up at the heart failure clinic for their redsvest program. CSW provided patient with list of local outpatient PT clinics. He's not sure if he is interested in it right now but CSW instructed him on what to do if he does decide to pursue it. Patient confirmed he is on oxygen at home. His daughter will pick him up today. No further concerns. CSW signing off.    Final next level of care: Home/Self Care Barriers to Discharge: Barriers Resolved   Patient Goals and CMS Choice        Discharge Placement                Patient to be transferred to facility by: Daughter will pick him up.   Patient and family notified of of transfer: 07/10/19  Discharge Plan and Services     Post Acute Care Choice: Home Health                               Social Determinants of Health (SDOH) Interventions     Readmission Risk Interventions No flowsheet data found.

## 2019-07-11 ENCOUNTER — Other Ambulatory Visit: Payer: Self-pay | Admitting: *Deleted

## 2019-07-11 NOTE — Patient Outreach (Signed)
[name]) Care Management  07/11/2019  Dustin [name] 19-Jan-1956 [id number]   Telephone follow up call  Covering for assigned care coordinator    63 year old male followed by [facility] care management for ongoing reinforcement of self health management of Chronic disease state of COPD/CHF.   Patient FXT:[id number] but not limited to Chronic respiratory failure, CHF, COPD on home oxygen, pulmonary hypertension , and anxiety.   Patient experienced hospital admission at [facility] 10/19-10/22 for COPD per notes transported from Pulmonary office to emergency room   Subjective Successful outreach call to patient,explained purpose for the call . Patient reports that he feeling alright,just recently getting up. He reports sleeping well on last night.  Patient discussed recent hospital admission due to increased shortness of breath when he went to pulmonary visit and had to be sent straight to the hospital. Patient reports being a little short of breath at this time as he has just being moving around in the home using his rolling walker.  Patient states that he is wearing oxygen at 2 liters via simply go. He reports having a cough at times with light yellow green sputum at times.  Patient discussed eating breakfast this morning having only yogurt as that is all that he felt like.    Patient discussed concern regarding recommendation for home health at discharge and not being able to find agency available and he  is interested and will benefit from services in the home as he states he would not be able to go outpatient therapy sessions.   Heart Failure  Patient reports that he has not weighed, checked blood pressure or saturations  yet for today, states that he has the scale that sends reading to insurance company. Discussed best time of day to weigh guidelines.  Patient denies increase in swelling on today. Encouraged to continue routine of daily weights, blood  pressure.   Medications Patient discussed that he had one more day of antibiotic and prednisone left to take today and he completed both of those already this morning along with regular medications.  Patient declined reviewing full ist of medications at this time.  Social /Follow up medical appointments  Patient discussed that he has several appointments on next week, beginning on Monday 10/26 with surgeon regarding umbilical hernia.  Patient states that his daughter will be the one to provide and assist with transportation to appointments.  Patient requested assistance with rescheduling appointment with Dr. Jayme Cloud, Pulmonary that was his initial visit with her as he  had previously been followed by Dr.Simonds. Patient requested that I communicate appointment time information with his daughter, [name] , placed call to daughter, no answer able to leave a message for return call.   Returned call to patient, to provide information on appointment with Dr.Gonzalez he is agreeable to date and will inform his daughter, patient able to teachback date time of appointment.  Patient now reports that his weight is remaining 226 and oxygen saturation was 90% on 2 liters.  Tuesday, 10/27 Dr. Jayme Cloud 1045 Wednesday, 10/28 Dr. Mclean-Scocuzza  10/29 Dr.Gollan, Cardiology 11/4, Heart failure Clinic.   Care Coordination Discharge summary note   Patient mentioned [name] as an agency he remembers being mentioned as agency that hospital staff was checking on for home health,but agency was not able to provide service at that time.  Placed call to [facility] as listed in [facility] note as agency [name] was seeking for services, spoke representative, hospital liaison Elnita Maxwell she  suggested returning call on Tuesday as agency may have availability.   Plan Will update assigned care coordinator for follow up on next week on patient , on patient medical appointments Patient will benefit from Home Health  service coordination of  services as available.  Patient advised to seek medical attention for worsening of shortness of breath , weight gain, swelling .    THN CM Care Plan Problem One     Most Recent Value  Care Plan for Problem One  Active  THN Long Term Goal   Over the next 45 days, patient will verbalize options for new housing resources and for community resources available to him, as evidenced by patient reporting and [facility] CSW collaboration as indicated during [facility] RN CM outreach  [facility] Long Term Goal Start Date  06/10/19    [facility] CM Care Plan Problem Two     Most Recent Value  Care Plan Problem Two  Need for ongoing reinforcement of self-health management of multiple chronic disease conditions, including COPD and CHF in patient who continues to smoke, as evidenced by patient reporting  Role Documenting the Problem Two  Care Management Coordinator  Care Plan for Problem Two  Active  Interventions for Problem Two Long Term Goal   Reviewed importance of daily monitoring of weights, best time of day to weigh,, review of worsening symptoms of heart failure and COPD and importance notifying MD sooner.   THN Long Term Goal  Over the next 31 days, patient will verbalize accurate understanding of daily self-health management strategies/ plan for COPD and CHF, as evidenced by patient reporting during [facility] RN CM outreach  [facility] Long Term Goal Start Date  07/11/19  THN CM Short Term Goal #1   Over the next 30 days, patient will take medications as prescribed, as evidenced by patient reporting and collaboration with [facility] Pharmacist during [facility] RN CM Outreach  [facility] CM Short Term Goal #1 Start Date  06/18/19  Interventions for Short Term Goal #2   Verified patient completed recent antibiotic and prednisone dosing, reinforced taking all prescribed medications.   THN CM Short Term Goal #2   Over the next 30 days, patient will continue working with his health insurance plan/ RN CM and verbalize process to obtain new  home oxygen system, as evidenced by patient reporting and collaboration with patient's insurance provider as indicated during [facility] RN CM Outreach  THN CM Short Term Goal #2 Start Date  06/18/19  Interventions for Short Term Goal #2  Discussed current oxygen use with simply go at 2 liters reinforced monitoring oxygen saturations.     [facility] CM Care Plan Problem Three     Most Recent Value  Care Plan Problem Three  High risk fo readmission related to recent hospitalization for COPD exacerbation 10/19- 10/23   Role Documenting the Problem Three  Care Management Coordinator  Care Plan for Problem Three  Active  THN Long Term Goal   Over the next 31 days patient will not experince a hospital readmission   THN Long Term Goal Start Date  07/11/19  Interventions for Problem Three Long Term Goal  Discusssed current clinical state, discharge instruction, new medications. Review of worsening sympotms in COPD yellow zone and action plan of notifyin MD sooner to avoid readmission   THN CM Short Term Goal #1   Over the next 20 days patient will be able to report attending all medical appointments.   THN CM Short Term Goal #1 Start Date  07/11/19  Interventions for Short Term Goal #1  Reviewed with patient upcoming scheduled appointments and importance of prompt MD follow up after discharge, assisted with scheduling post dc appointment with Pulmonary.       Joylene Draft, RN, [place] Management Coordinator  [phone]- Mobile [phone]- Toll Free Main Office

## 2019-07-14 ENCOUNTER — Ambulatory Visit: Payer: Medicare Other | Admitting: Surgery

## 2019-07-14 ENCOUNTER — Telehealth: Payer: Self-pay | Admitting: *Deleted

## 2019-07-14 NOTE — Telephone Encounter (Signed)
Copied from [place] [phone]. Topic: General - Other >> Jul 14, 2019  3:35 PM Celene Kras A wrote: Reason for CRM: Pt called stating he is supposed to be starting [facility] aid. Pt states this has not started and he is concerned. Pt is requesting an update. Please advise.

## 2019-07-15 ENCOUNTER — Other Ambulatory Visit: Payer: Self-pay

## 2019-07-15 ENCOUNTER — Encounter: Payer: Self-pay | Admitting: Pulmonary Disease

## 2019-07-15 ENCOUNTER — Ambulatory Visit (INDEPENDENT_AMBULATORY_CARE_PROVIDER_SITE_OTHER): Payer: Medicare Other | Admitting: Pulmonary Disease

## 2019-07-15 VITALS — BP 128/88 | HR 87 | Temp 98.7°F | Ht 70.0 in

## 2019-07-15 DIAGNOSIS — J449 Chronic obstructive pulmonary disease, unspecified: Secondary | ICD-10-CM | POA: Diagnosis not present

## 2019-07-15 DIAGNOSIS — I272 Pulmonary hypertension, unspecified: Secondary | ICD-10-CM

## 2019-07-15 DIAGNOSIS — Z7189 Other specified counseling: Secondary | ICD-10-CM

## 2019-07-15 DIAGNOSIS — J9611 Chronic respiratory failure with hypoxia: Secondary | ICD-10-CM | POA: Diagnosis not present

## 2019-07-15 NOTE — Progress Notes (Signed)
Subjective:    Patient ID: Dustin [name], male    DOB: 07-Apr-1956, 63 y.o.   MRN: [id number]  HPI  Patient is a 63 year old current smoker with Stage IV COPD (very severe) who was previously followed by Dr. Billy Fischer and is currently transferring his care to me after Dr. Sung Amabile departure.  Recall that the patient presented on 19 October to the clinic but at that time his oxygen saturations despite being on 2 L/min of oxygen were barely 83%.  He had to be placed on 6 L/min to maintain 91%.  He was very tachypneic and was admitted to [facility].  He was discharged on 19 October after a COPD exacerbation.  He presents today for routine follow-up.  He actually feels better since he was treated for his exacerbation.  He is back to his baseline 2 L/min of oxygen.  Prior CT chest have shown very severe emphysema.  The patient is on chronic anticoagulation with Eliquis due to prior DVT and PE.  His only concern today is that he is having issues with his oxygen concentrator he is having his DME company take a look at this.  He would like to go to [place], [place] for "1 last trip" between 16 November and 19 November.  He is currently maintained on Dulera and Spiriva and has been told that Trelegy would be best for him however Trelegy would not be covered.  I explained to him that actually [facility] and Spiriva are pretty much the same combination of medications that would be found in Trelegy.  The patient has not had any recent fevers, chills or sweats.  He voices no other complaint today.     We had a discussion with him and his daughter who is present with him today with regards to end-of-life issues.  DATA/EVENTS: 06/11/17 CT chest: very severe emphysema. Nonspecific RUL irregular opacity. 9 mm right lower lobe nodule is stable dating back to 11/30/2015 11/23/17 Echocardiogram: LVEF 50-55%, grade I diastolic dysfunction. Mild RV hypokinesis of the apex, RVSP est 45 mmHg 01/22/18 CT chest: Very  severe emphysema. New right lower lobe pneumonia. Probable left lower lobe airspace disease is well.  02/26/18 ED visit: CC of LE edema. CTA chest revealed possible PE. LE venous US revealed B DVT 02/26/18 LE venous US: Deep venous thrombosis noted in the right popliteal vein, right posterior tibial vein and left gastroc veins 02/26/18 CTA chest: Findings suspicious for subsegmental pulmonary emboli in the lingula and left lower lobe, motion artifact also considered but felt less likely. Advanced emphysema. Progression in consolidative and nodular opacities in both lower lobes since exam last month. This may be infectious or inflammatory. Given presence of debris in the bronchi, aspiration is considered. Given the nodular appearance, neoplastic process is not excluded, recommend follow-up chest CT in 3 months 05/22/18 CT/PET: Very severe emphysema.  No suspicious hypermetabolic activity within the neck, chest, abdomen or pelvis. The right lower lobe pulmonary nodule is stable, without hypermetabolic activity, consistent with a benign find 06/24/19 CT chest: Stable very severe emphysematous changes with pulmonary scarring, stable 7.5 mm right lower lobe pulmonary nodule no mediastinal mass or adenopathy 07/07/19 admitted to [name] for COPD exacerbation discharge 07/10/19  Review of Systems A 10 point review of systems was performed and it is as noted above otherwise negative.    Objective:   Physical Exam Vitals signs and nursing note reviewed.  Constitutional:      Appearance: He is ill-appearing (Chronically ill-appearing).  Comments: Appears much older than stated age.  In wheelchair due to severe dyspnea on ambulation.  HENT:     Head: Normocephalic and atraumatic.     Right Ear: External ear normal.     Left Ear: External ear normal.     Nose:     Comments: Nose/mouth/throat not examined due to masking requirements for COVID 19. Eyes:     General: No scleral icterus.     Conjunctiva/sclera: Conjunctivae normal.     Pupils: Pupils are equal, round, and reactive to light.  Neck:     Musculoskeletal: Neck supple.     Thyroid: No thyromegaly.     Trachea: Trachea and phonation normal.  Cardiovascular:     Rate and Rhythm: Normal rate and regular rhythm.     Heart sounds: Normal heart sounds, S1 normal and S2 normal.  Pulmonary:     Effort: Pulmonary effort is normal.     Breath sounds: Decreased air movement present. Decreased breath sounds (Distant breath sounds throughout) present. No wheezing, rhonchi or rales.     Comments: Coarse breath sounds Chest:     Chest wall: Deformity (Barrel chest, increased AP diameter) present.  Abdominal:     General: Abdomen is protuberant. There is no distension.  Musculoskeletal: Normal range of motion.     Right lower leg: 1+ Edema present.     Left lower leg: 1+ Edema present.  Lymphadenopathy:     Cervical: No cervical adenopathy.  Skin:    General: Skin is warm and dry.     Comments: Multiple ecchymoses  Neurological:     General: No focal deficit present.     Mental Status: He is alert and oriented to person, place, and time.  Psychiatric:        Mood and Affect: Mood normal.        Behavior: Behavior normal.        Assessment & Plan:   1.  COPD stage IV (very severe): Currently the patient appears to be fairly well compensated.  Recommend that he continue Dulera and Spiriva for now there is currently no advantage to switching to Trelegy particularly in view that Trelegy is not covered by his insurance company. We will see the patient in follow-up in 4 to 6 weeks time he is to contact us prior to that time should any new difficulties arise.  2.  Chronic respiratory failure with hypoxia: He appears to be well compensated on 2 L/min which is his baseline.  He has been having issues with his portable concentrator so we will have adapt check the issues with his portable concentrator.  It appears that  concentrator is not holding charge.  3.  Pulmonary hypertension/cor pulmonale: Patient is currently on diuretic therapy due to issues with recurrent lower extremity edema associated with this.  I have advised him that he will always have some degree of swelling due to his core pulmonale and actually will be more fluid dependent.  He was not sure as to whether taking his Lasix twice a day is helpful as he sometimes feels "terrible" after taking it like this for a few days.  I recommended that he take it 1 a day but however if his weight goes up by 5 pounds or more or if swelling becomes more pronounced he could take an extra furosemide that day.  He seemed to understand the instructions.  Will obtain an echocardiogram to reevaluate this issue.  Echocardiogram is scheduled for 6 November.  4.  End-of-life issues: We had a discussion with regards to end-of-life issues and the likelihood that if he ever has to be placed on a ventilator again he will likely not be able to be liberated from the ventilator due to his very severe COPD.  He was previously [facility] in March 2019 [facility] and [facility] .  The patient is currently indecisive but  is considering palliative care.  I have advised him that he should have clear goals of care delineated particularly in view of [place].    This chart was dictated using voice recognition software/Dragon.  Despite best efforts to proofread, errors can occur which can change the meaning.  Any change was purely unintentional.   C. Danice GoltzLaura Antonela Freiman, MD [facility]

## 2019-07-15 NOTE — Progress Notes (Deleted)
Cardiology Office Note    Date:  07/15/2019   ID:  Dustin [name], DOB 05-02-1956, MRN [id number]  PCP:  McLean-Scocuzza, Pasty Spillersracy N, MD  Cardiologist:  Julien Nordmannimothy Gollan, MD  Electrophysiologist:  None   Chief Complaint: Hospital follow-up  History of Present Illness:   Dustin [name] is a 63 y.o. 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   Patient was previously followed by Dr. Alvino ChapelIngal, though more recently has established with Dr. Mariah MillingGollan.  Prior echo from 11/2015 showed an EF of 60-65%, normal wall motion, grade 1 diastolic dysfunction, mildly dilated right ventricle with normal RV systolic function, moderately to severely elevated PASP.  Nuclear stress test in 05/2016 showed no significant ischemia with a small region of mild fixed perfusion abnormality in the apical region unable to exclude small region of infarct though felt to most likely represent processing artifact, EF estimated at 28% which was felt to be secondary to GI uptake artifact.  Overall, this was a low risk scan.  Follow-up echo in 04/2016 showed an EF of 60 to 6, no regional wall motion normalities, grade 1 diastolic dysfunction, calcified aortic annulus, PASP 35 mmHg.  Most recent echo from 11/2017 showed an EF of 50 to 55%, mild LVH, grade 1 diastolic dysfunction, dilated aortic root measuring 45 mm, trivial mitral regurgitation, mildly dilated right ventricle with reduced RV systolic function toward the apex, mildly dilated right atrium, mild tricuspid regurgitation, PASP 45 mmHg.  Patient was recently admitted to the hospital from 10/19 through 10/22 for COPD exacerbation and treated with steroids, inhalers, and antibiotics.  Cardiology  was not consulted during admission.  Cardiac enzymes were not cycled.  EKG showed sinus tachycardia with a bifascicular block.  COVID-19 test negative, BNP 124.  Discharge labs showed a WBC 15.0, Hgb 12.5, PLT 182, potassium 5.0, BUN 36, serum creatinine 1.04, A1c 5.6.  Documented discharge weight of 103.9 kg.  He has subsequently been seen by pulmonology on 07/15/2019 with repeat echocardiogram pending.  Due to his RV failure/pulmonary hypertension he was advised to continue current dose diuretic.   ***    Past Medical History:  Diagnosis Date  . Allergy   . Anxiety   . Asthma   . Chronic diastolic CHF (congestive heart failure) (HCC)    a. echo 07/2013: EF 60-65%, DD, biatrial dilatation, Ao sclerosis, dilated RV, moderate pulmonary HTN, elevated CV and RA pressures; b. patient reported echo at Dr. Milta DeitersKhan's office 02/2015 - his office does not have record of him being a pt there c. echo 11/2015: EF 60-65%, Grade 1 DD, mod-severe pulm pressures  . Chronic respiratory failure (HCC)    a. on 2L via nasal cannula; b. secondary to COPD  . COPD (chronic obstructive pulmonary disease) (HCC)    on 2L continuous   . Depression   . Emphysema of lung (HCC)   . GERD (gastroesophageal reflux disease)   . HCAP (healthcare-associated pneumonia)    01/22/18-01/28/18   . Headache   . Hypertension   . Leg DVT (deep venous thromboembolism), acute, bilateral (HCC)    02/26/18  . Personal history of tobacco use, presenting hazards to health 08/17/2015  . Pulmonary embolism (HCC)    02/26/18  .  Pulmonary HTN ([place])   . Tobacco abuse     Past Surgical History:  Procedure Laterality Date  . ABDOMINAL SURGERY    . ADENOIDECTOMY    . CARPAL TUNNEL RELEASE Right   . corpal tunnel Right   . ELBOW SURGERY Right    repaired tendon  . hemmorhoid N/A   . HEMORRHOID SURGERY    . NASAL SINUS SURGERY    . NASAL SINUS SURGERY     1970s  . NOSE SURGERY    . reflux surgery     1994  . ROTATOR CUFF REPAIR Right    . SHOULDER SURGERY    . TENNIS ELBOW RELEASE/NIRSCHEL PROCEDURE Right   . TONSILLECTOMY    . URETHRA SURGERY     surgery 6 times from age [age] yrs old  . URETHRA SURGERY      Current Medications: No outpatient medications have been marked as taking for the 07/17/19 encounter (Appointment) with Dustin Mu, PA-C.    Allergies:   Asa [aspirin], Bee venom, Codeine, Ibuprofen, and Iodinated diagnostic agents   Social History   Socioeconomic History  . Marital status: Legally Separated    Spouse name: Not on file  . Number of children: Not on file  . Years of education: Not on file  . Highest education level: Not on file  Occupational History  . Not on file  Social Needs  . Financial resource strain: Not hard at all  . Food insecurity    Worry: Never true    Inability: Never true  . Transportation needs    Medical: No    Non-medical: No  Tobacco Use  . Smoking status: Current Every Day Smoker    Packs/day: 0.10    Years: 40.00    Pack years: 4.00    Types: Cigarettes    Last attempt to quit: 01/14/2018    Years since quitting: 1.4  . Smokeless tobacco: Never Used  Substance and Sexual Activity  . Alcohol use: No  . Drug use: No  . Sexual activity: Not Currently  Lifestyle  . Physical activity    Days per week: 0 days    Minutes per session: 0 min  . Stress: Only a little  Relationships  . Social Herbalist on phone: Not on file    Gets together: Not on file    Attends religious service: Not on file    Active member of club or organization: Not on file    Attends meetings of clubs or organizations: Not on file    Relationship status: Separated  Other Topics Concern  . Not on file  Social History Narrative   On 2 L chronic home O2.  Lives at home alone    3 kids    No guns    Wears seat belt    Safe in relationship      Family History:  The patient's family history includes Alcohol abuse in his daughter; Arthritis in his daughter, father, and  mother; Asthma in his daughter and mother; Bipolar disorder in his mother; Birth defects in his brother, paternal grandmother, and sister; CAD in his father; Depression in his daughter, mother, and paternal grandmother; Drug abuse in his daughter; Hearing loss in his father and mother; Heart disease in his brother, father, mother, and paternal grandmother; Hyperlipidemia in his father and mother; Hypertension in his father and mother; Intellectual disability in his daughter; Kidney disease in his mother and son; Malignant hypertension in his  mother; Miscarriages / Stillbirths in his daughter; Peripheral Artery Disease in his mother; Rheum arthritis in his mother; Stroke in his father. There is no history of Diabetes.  ROS:   ROS   EKGs/Labs/Other Studies Reviewed:    Studies reviewed were summarized above. The additional studies were reviewed today: As above.   EKG:  EKG is ordered today.  The EKG ordered today demonstrates ***  Recent Labs: 12/06/2018: Magnesium 2.2 04/25/2019: ALT 14 05/23/2019: TSH 2.32 07/09/2019: B Natriuretic Peptide 124.0 07/10/2019: BUN 36; Creatinine, Ser 1.04; Hemoglobin 12.5; Platelets 182; Potassium 5.0; Sodium 142  Recent Lipid Panel    Component Value Date/Time   CHOL 121 05/23/2019 0819   CHOL 142 09/24/2013 1027   TRIG 55.0 05/23/2019 0819   TRIG 74 09/24/2013 1027   HDL 48.20 05/23/2019 0819   HDL 56 09/24/2013 1027   CHOLHDL 3 05/23/2019 0819   VLDL 11.0 05/23/2019 0819   VLDL 15 09/24/2013 1027   LDLCALC 62 05/23/2019 0819   LDLCALC 71 09/24/2013 1027    PHYSICAL EXAM:    VS:  There were no vitals taken for this visit.  BMI: There is no height or weight on file to calculate BMI.  Physical Exam  Wt Readings from Last 3 Encounters:  07/10/19 229 lb 0.9 oz (103.9 kg)  07/02/19 225 lb (102.1 kg)  06/13/19 228 lb (103.4 kg)     ASSESSMENT & PLAN:   1. ***  Disposition: F/u with Dr. Mariah Milling or an APP in ***.   Medication Adjustments/Labs  and Tests Ordered: Current medicines are reviewed at length with the patient today.  Concerns regarding medicines are outlined above. Medication changes, Labs and Tests ordered today are summarized above and listed in the Patient Instructions accessible in Encounters.   Signed, Eula Listen, PA-C 07/15/2019 7:56 AM     [facility] [place] [address] [place], [place] [address] [phone]

## 2019-07-15 NOTE — Patient Instructions (Signed)
1.  Continue Dulera and Spiriva as they are.  You seem to be well compensated on these medications.  Currently I would not switch to Trelegy as your Ruthe Mannan and Spiriva are being covered and Trelegy would not be.  2.  Continue use of oxygen.  We will have [employer] check on the issues with your portable concentrator.  3.  We discussed the use of your fluid pills.  Because of the right side of your heart being weaker, you will always have some degree of swelling.  Recommend to take 1 fluid pill daily but if your weight goes up by 5 pounds or more or if the swelling becomes tighter you could take an extra pill that day.  4.  We will get an echocardiogram to recheck the right side of your heart.  5.  We will see you in follow-up in 4 to 6 weeks time.

## 2019-07-16 ENCOUNTER — Ambulatory Visit: Payer: Medicare Other | Admitting: Internal Medicine

## 2019-07-16 NOTE — Telephone Encounter (Signed)
This should have been ordered from hospital  I did not order this  If he needs this would ask his cardiologist or lung doctor to order  I can order if needed but insurance may not cover if not seen in the last 3 months, I think he has been seen though    Why does he need an aid?

## 2019-07-16 NOTE — Telephone Encounter (Signed)
No referral for [facility] in chart.

## 2019-07-16 NOTE — Telephone Encounter (Signed)
Unable to leave message for patient to return call back, phone kept ringing. PEC may give and obtain information.  

## 2019-07-17 ENCOUNTER — Ambulatory Visit (INDEPENDENT_AMBULATORY_CARE_PROVIDER_SITE_OTHER): Payer: Medicare Other | Admitting: Pharmacist

## 2019-07-17 ENCOUNTER — Ambulatory Visit: Payer: Medicare Other | Admitting: Physician Assistant

## 2019-07-17 DIAGNOSIS — J439 Emphysema, unspecified: Secondary | ICD-10-CM

## 2019-07-17 DIAGNOSIS — I5032 Chronic diastolic (congestive) heart failure: Secondary | ICD-10-CM | POA: Diagnosis not present

## 2019-07-17 NOTE — Progress Notes (Signed)
Thanks Barnetta Chapel.  Dr. Darnell Level.

## 2019-07-17 NOTE — Chronic Care Management (AMB) (Signed)
Chronic Care Management   Follow Up Note   07/17/2019 Name: Dustin CoriaDennis R [name] MRN: [id number] DOB: [birth year]  Referred by: McLean-Scocuzza, Dustin Spillersracy N, MD Reason for referral : Chronic Care Management (Medication Management)   Dustin CoriaDennis R [name] is a 63 y.o. year old male who is a primary care patient of McLean-Scocuzza, Dustin Spillersracy N, MD. The CCM team was consulted for assistance with chronic disease management and care coordination needs.    Contacted patient for medication management review today.   Review of patient status, including review of consultants reports, relevant laboratory and other test results, and collaboration with appropriate care team members and the patient's provider was performed as part of comprehensive patient evaluation and provision of chronic care management services.    SDOH (Social Determinants of Health) screening performed today: Social Connections Advanced Directives Stress. See Care Plan for related entries.   Advanced Directives Status: [name] See Care Plan and Vynca application for related entries.  Outpatient Encounter Medications as of 07/17/2019  Medication Sig Note  . AMBULATORY NON FORMULARY MEDICATION 1 each by Does not apply route as needed. Please provide patient with "So Clean" CPAP cleaning device to be used as needed.   . furosemide (LASIX) 40 MG tablet Take 1 tablet (40 mg) by mouth twice daily 07/17/2019: Alternating 40 mg daily, then 80 mg    . Acetylcysteine (NAC) 600 MG CAPS Take 1 tablet by mouth daily.    Marland Kitchen. ALPRAZolam (XANAX) 1 MG tablet Take 1 tablet (1 mg total) by mouth 3 (three) times daily as needed for anxiety.   Marland Kitchen. antiseptic oral rinse (BIOTENE) LIQD 15 mLs by Mouth Rinse route daily as needed for dry mouth.   Marland Kitchen. apixaban (ELIQUIS) 5 MG TABS tablet Take 1 tablet (5 mg total) by mouth 2 (two) times daily.   Marland Kitchen. b complex vitamins capsule Take 1 capsule by mouth daily.   . carbamide peroxide (DEBROX) 6.5 % OTIC solution Place 5 drops into both  ears 2 (two) times daily. Prn x 4-7 days   . Cholecalciferol (VITAMIN D) 2000 units tablet Take 2,000 Units by mouth 2 (two) times a day.    . clobetasol (TEMOVATE) 0.05 % external solution Apply 1 application topically 2 (two) times daily. Prn to scalp and ears   . diltiazem (CARDIZEM) 30 MG tablet Take 1 tablet (30 mg total) by mouth 3 (three) times daily as needed. (Patient taking differently: Take 30 mg by mouth 3 (three) times daily as needed (heartrate). )   . doxycycline (VIBRA-TABS) 100 MG tablet Take 1 tablet (100 mg total) by mouth daily. For 1 more day.   . escitalopram (LEXAPRO) 20 MG tablet Take 1 tablet (20 mg total) by mouth at bedtime.   . fluticasone (FLONASE) 50 MCG/ACT nasal spray Place 2 sprays into both nostrils daily as needed for allergies or rhinitis.   Marland Kitchen. gabapentin (NEURONTIN) 300 MG capsule TAKE 1 TO 2 CAPSULES BY MOUTH THREE TIMES DAILY (Patient taking differently: Take 300-600 mg by mouth 3 (three) times daily. )   . hydrocortisone 2.5 % cream Apply topically 2 (two) times daily as needed. face (Patient taking differently: Apply 1 application topically 2 (two) times daily as needed (skin irritations). (apply to the face))   . ipratropium-albuterol (DUONEB) 0.5-2.5 (3) MG/3ML SOLN Take 3 mLs by nebulization every 6 (six) hours as needed. (Patient taking differently: Take 3 mLs by nebulization every 6 (six) hours as needed (shortness of breath/wheezing). )   . ketoconazole (NIZORAL) 2 % shampoo  Apply 1 application topically 2 (two) times a week. Let stand for 5 minutes   . magnesium oxide (MAG-OX) 400 MG tablet Take 400 mg by mouth daily.   . mometasone-formoterol (DULERA) 200-5 MCG/ACT AERO Inhale 2 puffs into the lungs 2 (two) times daily.   Marland Kitchen nystatin (MYCOSTATIN) 100000 UNIT/ML suspension Use as directed 5 mLs (500,000 Units total) in the mouth or throat 4 (four) times daily. Swish and spit 3 or 4 times daily for at least 5 days.  Then may be used as needed thereafter for  thrush   . potassium chloride (KLOR-CON) 10 MEQ tablet Take 4 tablets (40 meq) by mouth once daily (Patient taking differently: Take 20 mEq by mouth daily. )   . Probiotic Product (PROBIOTIC PO) Take 1 capsule by mouth 2 (two) times a day.    Marland Kitchen rOPINIRole (REQUIP) 0.25 MG tablet Take 1 tablet (0.25 mg total) by mouth 2 (two) times daily.   . sodium chloride (OCEAN) 0.65 % SOLN nasal spray Place 1-2 sprays into both nostrils as needed for congestion.   Marland Kitchen Spacer/Aero-Holding Chambers (OPTICHAMBER ADVANTAGE-LG MASK) MISC Use as directed with inhaler diag  j44.1   . theophylline (UNIPHYL) 400 MG 24 hr tablet Take 1 tablet (400 mg total) by mouth every morning.   . Tiotropium Bromide Monohydrate (SPIRIVA RESPIMAT) 2.5 MCG/ACT AERS Inhale 2 puffs into the lungs daily.   . traZODone (DESYREL) 50 MG tablet Take 1-2 tablets (50-100 mg total) by mouth at bedtime as needed for sleep.   . [DISCONTINUED] predniSONE (DELTASONE) 50 MG tablet Take 1 tablet (50 mg total) by mouth daily with breakfast. For 1 more day.    No facility-administered encounter medications on file as of 07/17/2019.      Goals Addressed            This Visit's Progress     Patient Stated   . "I want to stay well" (pt-stated)       Current Barriers:  . Polypharmacy - patient with COPD, CHF, Afib, anxiety . Financial concerns- resolved; patient is receiving Dulera (Merck), Spiriva (BI), and Eliquis (BMS) patient assistance through 09/18/2019. o CHF/Afib; follows w/ Dr. Rockey [name]; appointment this afternoon with Dustin Faith, PA for follow up. Appointment w/ HF clinic next week.  - Eliquis 5 mg BID, diltiazem 30 mg PRN tachycardia - Furosemide 40 mg one day, 80 mg the next, etc o COPD; recent hospitalization 10/19-10/22 for COPD exacerbation, established w/ Dr. Patsey [name] on 07/15/2019 - Dulera 200 mg BID + Spiriva 18 mcg daily, azithromycin 250 QOD, theophylline 400 mg QAM, Duonebs PRN.  - Patient notes that he and Dr. Patsey [name] had a  frank conversation regarding mortality expectations. He has been thinking through financial and healthcare wishes, and wants to make his quality of life a priority. Notes [place] "I don't know how much longer I'll be here after that".   Pharmacist Clinical Goal(s):  Marland Kitchen Over the next 90 days, patient will work with PharmD, primary care provider, and specialty providers to address needs related to optimized medication management  Interventions: . Provided empathetic listening and counseling to patient regarding his expectations of and goals of care.  . Collaborated w/ Reginia Naas, RN CM. She has a call planned to him tomorrow. Will plan to discuss his current HCPOA and living will information on file. May consider SW referral for any assistance in changing this, or any advice/assistance in setting up his finances .  Discussed potential referral to Palliative Care team to provide mental and physical support to patient regarding goals of care and symptomatic relief related to COPD and HF.   Patient Self Care Activities:  . Self administers medications as prescribed  Please see past updates related to this goal by clicking on the "Past Updates" button in the selected goal          Plan:  - Will continue to collaborate w/ multidisciplinary team on this patient's care.  - Will outreach patient in ~4-5 weeks for continued medication management  Catie Feliz Beam, PharmD, CPP Clinical Pharmacist [facility] [place] [employer] [phone]

## 2019-07-17 NOTE — Patient Instructions (Signed)
Visit Information  Goals Addressed            This Visit's Progress     Patient Stated   . "I want to stay well" (pt-stated)       Current Barriers:  . Polypharmacy - patient with COPD, CHF, Afib, anxiety . Financial concerns- resolved; patient is receiving Dulera (Merck), Spiriva (BI), and Eliquis (BMS) patient assistance through 09/18/2019. o CHF/Afib; follows w/ Dr. Rockey Situ; appointment this afternoon with Christell Faith, PA for follow up. Appointment w/ HF clinic next week.  - Eliquis 5 mg BID, diltiazem 30 mg PRN tachycardia - Furosemide 40 mg one day, 80 mg the next, etc o COPD; recent hospitalization 10/19-10/22 for COPD exacerbation, established w/ Dr. Patsey Berthold on 07/15/2019 - Dulera 200 mg BID + Spiriva 18 mcg daily, azithromycin 250 QOD, theophylline 400 mg QAM, Duonebs PRN.  - Patient notes that he and Dr. Patsey Berthold had a frank conversation regarding mortality expectations. He has been thinking through financial and healthcare wishes, and wants to make his quality of life a priority. Notes [place] is [place] "I don't know how much longer I'll be here after that".   Pharmacist Clinical Goal(s):  Marland Kitchen Over the next 90 days, patient will work with PharmD, primary care provider, and specialty providers to address needs related to optimized medication management  Interventions: . Provided empathetic listening and counseling to patient regarding his expectations of and goals of care.  . Collaborated w/ Reginia Naas, RN CM. She has a call planned to him tomorrow. Will plan to discuss his current HCPOA and living will information on file. May consider SW referral for any assistance in changing this, or any advice/assistance in setting up his finances . Discussed potential referral to Palliative Care team to provide mental and physical support to patient regarding goals of care and symptomatic relief related to COPD and HF.   Patient Self Care Activities:   . Self administers medications as prescribed  Please see past updates related to this goal by clicking on the "Past Updates" button in the selected goal         The patient verbalized understanding of instructions provided today and declined a print copy of patient instruction materials.   Plan:  - Will continue to collaborate w/ multidisciplinary team on this patient's care.  - Will outreach patient in ~4-5 weeks for continued medication management  Catie Darnelle Maffucci, PharmD, [place] Pharmacist [facility] [place] [phone]

## 2019-07-18 ENCOUNTER — Encounter: Payer: Self-pay | Admitting: *Deleted

## 2019-07-18 ENCOUNTER — Other Ambulatory Visit: Payer: Self-pay | Admitting: Internal Medicine

## 2019-07-18 ENCOUNTER — Other Ambulatory Visit: Payer: Self-pay | Admitting: *Deleted

## 2019-07-18 DIAGNOSIS — G4700 Insomnia, unspecified: Secondary | ICD-10-CM

## 2019-07-18 DIAGNOSIS — J189 Pneumonia, unspecified organism: Secondary | ICD-10-CM

## 2019-07-18 DIAGNOSIS — J9611 Chronic respiratory failure with hypoxia: Secondary | ICD-10-CM

## 2019-07-18 DIAGNOSIS — Z789 Other specified health status: Secondary | ICD-10-CM

## 2019-07-18 DIAGNOSIS — I5032 Chronic diastolic (congestive) heart failure: Secondary | ICD-10-CM

## 2019-07-18 DIAGNOSIS — J690 Pneumonitis due to inhalation of food and vomit: Secondary | ICD-10-CM

## 2019-07-18 DIAGNOSIS — I5033 Acute on chronic diastolic (congestive) heart failure: Secondary | ICD-10-CM

## 2019-07-18 DIAGNOSIS — J9622 Acute and chronic respiratory failure with hypercapnia: Secondary | ICD-10-CM

## 2019-07-18 DIAGNOSIS — J432 Centrilobular emphysema: Secondary | ICD-10-CM

## 2019-07-18 DIAGNOSIS — I2699 Other pulmonary embolism without acute cor pulmonale: Secondary | ICD-10-CM

## 2019-07-18 DIAGNOSIS — J439 Emphysema, unspecified: Secondary | ICD-10-CM

## 2019-07-18 DIAGNOSIS — I82403 Acute embolism and thrombosis of unspecified deep veins of lower extremity, bilateral: Secondary | ICD-10-CM

## 2019-07-18 DIAGNOSIS — I272 Pulmonary hypertension, unspecified: Secondary | ICD-10-CM

## 2019-07-18 DIAGNOSIS — F419 Anxiety disorder, unspecified: Secondary | ICD-10-CM

## 2019-07-18 DIAGNOSIS — I10 Essential (primary) hypertension: Secondary | ICD-10-CM

## 2019-07-18 NOTE — Patient Outreach (Signed)
[facility]) [place] Telephone Outreach PCP office completes Transition of Care follow up post-hospital discharge Post-hospital discharge day #  Unsuccessful (consecutive) telephone outreach # 1- previously engaged patient  07/18/2019  Dustin [name] 06-24-1956 [id number]  Unsuccessful telephone outreach attempt to Dustin [name], 63 y/o male referred to [place] by [facility] Pharmacist embedded in patient's PCP office for assessment of community resource needs and ongoing reinforcement of self- health management of chronic disease state of COPD/ CHF. Patient has had no recent inpatient hospital admissions. Patient has history including, but not limited to, chronic respiratory failure; CHF; COPD on home O2 with ongoing tobacco use; pulmonary HTN; and anxiety.  Since [facility] CM initially became involved in patient's care, her had recent hospitalization October 19-22, 2020 for COPD exacerbation/ acute on chronic respiratory failure.  Patient was discharged home to self-care with home health services.  With today's call attempt, received automated outgoing voice message stating that patient has a voice mail box that has not yet been set up yet; unable to leave patient voice message requesting call back.  Plan:  Will re-attempt [place] telephone outreach within 4 business days if I do not hear back from patient first.  Oneta Rack, RN, BSN, [place] Coordinator [facility] Care Management  [phone]

## 2019-07-20 DIAGNOSIS — J471 Bronchiectasis with (acute) exacerbation: Secondary | ICD-10-CM | POA: Diagnosis not present

## 2019-07-20 DIAGNOSIS — J449 Chronic obstructive pulmonary disease, unspecified: Secondary | ICD-10-CM | POA: Diagnosis not present

## 2019-07-21 ENCOUNTER — Telehealth: Payer: Self-pay | Admitting: Nurse Practitioner

## 2019-07-21 NOTE — Progress Notes (Signed)
Patient ID: Dustin [name], male    DOB: [birth year], 63 y.o.   MRN: [id number]  HPI  Dustin [name] is a 63 year old male with a history of CHF, HTN, pulmonary hypertension, bilateral leg DVT, pulmonary embolism, and COPD.  His 2D echo from 11/23/17 was reviewed and shows an EF of 50-55%.   He was admitted 07/07/2019 for a COPD exacerbation. He was treated with IV solumedrol and discharged after 3 days. He went to the ED 04/25/2019 for foot swelling which resolved and was discharged.   He presents today for an initial visit with a chief complaint of shortness of breath on moderate exertion and fatigue. This is associated with a chronic productive cough, feet swelling, chronic finger pain, and trouble sleeping. He denies chest pain, palpitations, abdominal distention, dizziness, and light-headedness. He has been weighing himself every day and his weight is between 224-226 pounds. He does not add salt to his food and uses Mrs Deliah Boston. He tries to read the nutrition label for sodium content. He wears compression socks most days and puts his legs up while sitting. He wears 2L oxygen around the clock.   Past Medical History:  Diagnosis Date  . Allergy   . Anxiety   . Asthma   . Chronic diastolic CHF (congestive heart failure) ([place])    a. echo 07/2013: EF 60-65%, DD, biatrial dilatation, Ao sclerosis, dilated RV, moderate pulmonary HTN, elevated CV and RA pressures; b. patient reported echo at Dr. Laurelyn Sickle office 02/2015 - his office does not have record of him being a pt there c. echo 11/2015: EF 60-65%, Grade 1 DD, mod-severe pulm pressures  . Chronic respiratory failure (HCC)    a. on 2L via nasal cannula; b. secondary to COPD  . COPD (chronic obstructive pulmonary disease) (HCC)    on 2L continuous   . Depression   . Emphysema of lung ([place])   . GERD (gastroesophageal reflux disease)   . HCAP (healthcare-associated pneumonia)    01/22/18-01/28/18   . Headache   . Hypertension   . Leg DVT (deep venous  thromboembolism), acute, bilateral ([place])    02/26/18  . Personal history of tobacco use, presenting hazards to health 08/17/2015  . Pulmonary embolism ([place])    02/26/18  . Pulmonary HTN ([place])   . Tobacco abuse    Past Surgical History:  Procedure Laterality Date  . ABDOMINAL SURGERY    . ADENOIDECTOMY    . CARPAL TUNNEL RELEASE Right   . corpal tunnel Right   . ELBOW SURGERY Right    repaired tendon  . hemmorhoid N/A   . HEMORRHOID SURGERY    . NASAL SINUS SURGERY    . NASAL SINUS SURGERY     1970s  . NOSE SURGERY    . reflux surgery     1994  . ROTATOR CUFF REPAIR Right   . SHOULDER SURGERY    . TENNIS ELBOW RELEASE/NIRSCHEL PROCEDURE Right   . TONSILLECTOMY    . URETHRA SURGERY     surgery 6 times from age [age] yrs old  . URETHRA SURGERY     Family History  Problem Relation Age of Onset  . CAD Father   . Hyperlipidemia Father   . Stroke Father   . Heart disease Father   . Arthritis Father   . Hearing loss Father   . Hypertension Father   . Hypertension Mother   . Peripheral Artery Disease Mother   . Rheum arthritis Mother   .  Asthma Mother   . Bipolar disorder Mother   . Depression Mother   . Malignant hypertension Mother   . Arthritis Mother   . Hearing loss Mother   . Heart disease Mother   . Hyperlipidemia Mother   . Kidney disease Mother   . Birth defects Brother   . Heart disease Brother   . Alcohol abuse Daughter   . Arthritis Daughter   . Asthma Daughter   . Depression Daughter   . Drug abuse Daughter   . Miscarriages / [place] Daughter   . Intellectual disability Daughter   . Kidney disease Son   . Birth defects Paternal Grandmother   . Depression Paternal Grandmother   . Heart disease Paternal Grandmother   . Birth defects Sister   . Diabetes Neg Hx    Social History   Tobacco Use  . Smoking status: Current Every Day Smoker    Packs/day: 0.10    Years: 40.00    Pack years: 4.00    Types: Cigarettes    Last attempt to quit:  01/14/2018    Years since quitting: 1.5  . Smokeless tobacco: Never Used  Substance Use Topics  . Alcohol use: No   Allergies  Allergen Reactions  . Asa [Aspirin] Other (See Comments)    Reaction: swelling of the right side.  . Bee Venom Swelling  . Codeine Other (See Comments)    Reaction: Difficulty breathing  . Ibuprofen Other (See Comments)    Reaction: Swelling of the right side.  . Iodinated Diagnostic Agents Other (See Comments)    Reaction:  Unknown  Other reaction(s): Unknown   Prior to Admission medications   Medication Sig Start Date End Date Taking? Authorizing Provider  Acetylcysteine (NAC) 600 MG CAPS Take 1 tablet by mouth daily.    Yes [provider]  ALPRAZolam Prudy Feeler) 1 MG tablet Take 1 tablet (1 mg total) by mouth 3 (three) times daily as needed for anxiety. 04/17/19  Yes McLean-Scocuzza, Pasty Spillers, MD  AMBULATORY NON FORMULARY MEDICATION 1 each by Does not apply route as needed. Please provide patient with "So Clean" CPAP cleaning device to be used as needed. 12/04/18  Yes Merwyn Katos, MD  antiseptic oral rinse (BIOTENE) LIQD 15 mLs by Mouth Rinse route daily as needed for dry mouth. 12/06/18  Yes McLean-Scocuzza, Pasty Spillers, MD  apixaban (ELIQUIS) 5 MG TABS tablet Take 1 tablet (5 mg total) by mouth 2 (two) times daily. 02/18/19  Yes McLean-Scocuzza, Pasty Spillers, MD  b complex vitamins capsule Take 1 capsule by mouth daily.   Yes [provider]  carbamide peroxide (DEBROX) 6.5 % OTIC solution Place 5 drops into both ears 2 (two) times daily. Prn x 4-7 days 12/06/18  Yes McLean-Scocuzza, Pasty Spillers, MD  Cholecalciferol (VITAMIN D) 2000 units tablet Take 2,000 Units by mouth 2 (two) times a day.    Yes [provider]  clobetasol (TEMOVATE) 0.05 % external solution Apply 1 application topically 2 (two) times daily. Prn to scalp and ears 08/07/18  Yes McLean-Scocuzza, Pasty Spillers, MD  diltiazem (CARDIZEM) 30 MG tablet Take 1 tablet (30 mg total) by mouth 3  (three) times daily as needed. Patient taking differently: Take 30 mg by mouth 3 (three) times daily as needed (heartrate).  11/17/18  Yes McLean-Scocuzza, Pasty Spillers, MD  doxycycline (VIBRA-TABS) 100 MG tablet Take 1 tablet (100 mg total) by mouth daily. For 1 more day. 07/11/19  Yes Mayo, Allyn Kenner, MD  escitalopram (LEXAPRO) 20  MG tablet Take 1 tablet (20 mg total) by mouth at bedtime. 02/25/19  Yes McLean-Scocuzza, Pasty Spillers, MD  fluticasone (FLONASE) 50 MCG/ACT nasal spray Place 2 sprays into both nostrils daily as needed for allergies or rhinitis. 12/06/18  Yes McLean-Scocuzza, Pasty Spillers, MD  furosemide (LASIX) 40 MG tablet Take 1 tablet (40 mg) by mouth twice daily 07/02/19  Yes Gollan, Tollie Pizza, MD  gabapentin (NEURONTIN) 300 MG capsule TAKE 1 TO 2 CAPSULES BY MOUTH THREE TIMES DAILY Patient taking differently: Take 300-600 mg by mouth 3 (three) times daily.  04/11/19  Yes McLean-Scocuzza, Pasty Spillers, MD  hydrocortisone 2.5 % cream Apply topically 2 (two) times daily as needed. face Patient taking differently: Apply 1 application topically 2 (two) times daily as needed (skin irritations). (apply to the face) 05/29/18  Yes McLean-Scocuzza, Pasty Spillers, MD  ipratropium-albuterol (DUONEB) 0.5-2.5 (3) MG/3ML SOLN Take 3 mLs by nebulization every 6 (six) hours as needed. Patient taking differently: Take 3 mLs by nebulization every 6 (six) hours as needed (shortness of breath/wheezing).  04/10/19  Yes McLean-Scocuzza, Pasty Spillers, MD  ketoconazole (NIZORAL) 2 % shampoo Apply 1 application topically 2 (two) times a week. Let stand for 5 minutes 05/30/18  Yes McLean-Scocuzza, Pasty Spillers, MD  magnesium oxide (MAG-OX) 400 MG tablet Take 400 mg by mouth daily.   Yes [provider]  mometasone-formoterol (DULERA) 200-5 MCG/ACT AERO Inhale 2 puffs into the lungs 2 (two) times daily. 05/21/18  Yes Merwyn Katos, MD  nystatin (MYCOSTATIN) 100000 UNIT/ML suspension Use as directed 5 mLs (500,000 Units total) in the mouth or  throat 4 (four) times daily. Swish and spit 3 or 4 times daily for at least 5 days.  Then may be used as needed thereafter for thrush 11/15/18  Yes Merwyn Katos, MD  potassium chloride (KLOR-CON) 10 MEQ tablet Take 4 tablets (40 meq) by mouth once daily Patient taking differently: Take 20 mEq by mouth daily. 20 every other day, alternate with 40 every other day 07/02/19  Yes Gollan, Tollie Pizza, MD  Probiotic Product (PROBIOTIC PO) Take 1 capsule by mouth 2 (two) times a day.    Yes [provider]  rOPINIRole (REQUIP) 0.25 MG tablet Take 1 tablet (0.25 mg total) by mouth 2 (two) times daily. 02/25/19  Yes McLean-Scocuzza, Pasty Spillers, MD  sodium chloride (OCEAN) 0.65 % SOLN nasal spray Place 1-2 sprays into both nostrils as needed for congestion. 03/12/19  Yes McLean-Scocuzza, Pasty Spillers, MD  Spacer/Aero-Holding Chambers [facility] ADVANTAGE-LG MASK) MISC Use as directed with inhaler diag  j44.1 09/28/17  Yes Yevonne Pax, MD  theophylline (UNIPHYL) 400 MG 24 hr tablet Take 1 tablet (400 mg total) by mouth every morning. 03/03/19  Yes Merwyn Katos, MD  Tiotropium Bromide Monohydrate (SPIRIVA RESPIMAT) 2.5 MCG/ACT AERS Inhale 2 puffs into the lungs daily. 01/08/19  Yes Merwyn Katos, MD  traZODone (DESYREL) 50 MG tablet Take 1-2 tablets (50-100 mg total) by mouth at bedtime as needed for sleep. 01/14/19  Yes McLean-Scocuzza, Pasty Spillers, MD   Review of Systems  Constitutional: Positive for fatigue. Negative for appetite change.  HENT: Positive for congestion. Negative for postnasal drip and sore throat.   Eyes: Negative.   Respiratory: Positive for cough (chronic productive) and shortness of breath (with moderate exertion).   Cardiovascular: Positive for leg swelling (feet). Negative for chest pain and palpitations.  Gastrointestinal: Negative for abdominal distention and abdominal pain.  Endocrine: Negative.   Genitourinary: Positive for frequency.  Musculoskeletal: Positive for arthralgias  (fingers). Negative for back pain.  Skin: Negative.   Allergic/Immunologic: Negative.   Neurological: Negative for dizziness and light-headedness.  Hematological: Negative for adenopathy. Bruises/bleeds easily.  Psychiatric/Behavioral: Positive for sleep disturbance (sleeps on 2 pillows, bed can raise up as well). Negative for dysphoric mood. The patient is not nervous/anxious.    Vitals:   07/23/19 1208  BP: 120/65  Pulse: (!) 114  Resp: (!) 24  SpO2: 94%   Filed Weights   07/23/19 1208  Weight: 225 lb (102.1 kg)   Lab Results  Component Value Date   CREATININE 1.04 07/10/2019   CREATININE 1.29 (H) 07/09/2019   CREATININE 1.27 (H) 07/08/2019    Physical Exam Vitals signs and nursing note reviewed.  Constitutional:      Appearance: Normal appearance.  HENT:     Head: Normocephalic and atraumatic.  Neck:     Musculoskeletal: Normal range of motion.  Cardiovascular:     Rate and Rhythm: Normal rate and regular rhythm.     Heart sounds: Normal heart sounds.  Pulmonary:     Effort: Pulmonary effort is normal. No respiratory distress.     Breath sounds: Normal breath sounds. No wheezing or rales.  Abdominal:     General: Abdomen is flat.     Palpations: Abdomen is soft.  Musculoskeletal:     Right lower leg: Edema (1+ to shins) present.     Left lower leg: Edema (1+ to shins) present.  Skin:    General: Skin is warm and dry.  Neurological:     General: No focal deficit present.     Mental Status: He is alert and oriented to person, place, and time.  Psychiatric:        Mood and Affect: Mood normal.        Behavior: Behavior normal.    Assessment/Plan:  1: Heart failure with preserved ejection fraction- - NYHA class II - euvolemic today - weighing daily; reminded to call for a weight gain >2 pounds overnight or >5 pounds in a week - does not add salt to food, educated on following a 2,000mg  sodium diet - he drinks approximately 55-60 ounces of fluid per day -  will recheck BMP today, since last potassium borderline high - 07/09/2019 BNP 124.0 - saw cardiology Mariah Milling([place]) 07/02/2019 via telemedicine  - has palliative care at home - does not plan to receive a flu vaccine this season. Encouraged good handwashing.   2: Hypertension- - BP looks good today - saw PCP (McLean-Scocuzza) 06/13/2019 - BMP reviewed from 07/10/2019 and showed sodium 142, potassium 5.0, creatinine 1.04, GFR >60  3: COPD- - saw pulmonology Jayme Cloud([facility]) 07/15/2019 - wears 2L oxygen around the clock  Patient did not bring medications nor a list. Each medication was verbally reviewed with the patient and was encouraged to bring the bottles to every visit to confirm accuracy of the list.  Return in 2 months or sooner for any questions/problems before then.

## 2019-07-21 NOTE — Telephone Encounter (Signed)
Spoke with patient regarding Palliative services and he was in agreement with this.  I have scheduled a Telephone Consult for 07/22/19 @ 2 PM.

## 2019-07-22 ENCOUNTER — Encounter: Payer: Self-pay | Admitting: Nurse Practitioner

## 2019-07-22 ENCOUNTER — Other Ambulatory Visit: Payer: Self-pay

## 2019-07-22 ENCOUNTER — Other Ambulatory Visit: Payer: Self-pay | Admitting: Nurse Practitioner

## 2019-07-22 DIAGNOSIS — Z515 Encounter for palliative care: Secondary | ICD-10-CM | POA: Diagnosis not present

## 2019-07-22 NOTE — Progress Notes (Addendum)
[facility], [facility] Consult Note Telephone: [phone]) [phone]  Fax: [phone]  PATIENT NAME: Dustin [name] DOB: 05-Mar-1956 MRN: [id number]  PRIMARY CARE PROVIDER:   McLean-Scocuzza, Pasty Spillersracy N, MD  REFERRING PROVIDER:  McLean-Scocuzza, Pasty Spillersracy N, MD [address] [name] [id number]  RESPONSIBLE PARTY:   Self  Due to the COVID-19 crisis, this visit was done via telemedicine from my office and it was initiated and consent by this patient and or family.  I was asked by Dr Marcos EkeGonzales to see Dustin [name] for palliative care consult for goc.  RECOMMENDATIONS and PLAN:  1. ACP: Discussed made DNR; will mail blank MOST form with Hard Choice booklet for review and will revisit at next pc visit  Addendeum: patient sister, [name] called, notify she is POA if Dustin [name] was not able to make decisions for himself. She requested the DNR be revoked until she is able to have further discussion with Dustin [name]. Currently [name] is capable of making his decisions. Discussed at length with sister about MOST form, goc, role palliative care.  2. Palliative care encounter Palliative medicine team will continue to support patient, patient's family, and medical team. Visit consisted of counseling and education dealing with the complex and emotionally intense issues of symptom management and palliative care in the setting of serious and potentially life-threatening illness  I spent 65 minutes providing this consultation,  from 1:55pm to 3:00pm. More than 50% of the time in this consultation was spent coordinating communication.   HISTORY OF PRESENT ILLNESS:  Dustin [name] is a 63 y.o. year old male with multiple medical problems including COPD, chronic respiratory failure, chronic diastolic congestive heart failure, history of pulmonary embolism, history of DVT, hypertension, gerd, asthma, allergies, anxiety, depression, tonsillectomy, urethral  surgery, right tennis elbow, right rotator cuff repair, nasal surgery sinus, hemorrhoid surgery, carpal tunnel release on right, adenoidectomy, tobacco abuse. He had a recent hospitalization 10 / 19 / 2020 to 10 / 22 / 2020 for acute on chronic respiratory failure with hypoxia, COPD exacerbation requiring inhalation therapy, continued home O2 and recommended home health for PT / 80. Chronic diastolic congestive heart failure did not appear to be volume overload the submission. He is on home Eliquis do to history of DVT PE. Macrocytic anemia with hemoglobin close to baseline. He did have a follow-up appointment with pulmonologist Dr Marcos EkeGonzales 10 / 27 / 2020 continued use of oxygen, continues free that and dulera and appeared to be well compensated on medication. Recommendation to have echocardiogram to recheck right side of heart. Palliative care outpatient referral. I called Dustin [name] for schedule palliative care telemedicine telephonic initial visit. We talked about purpose for palliative care visit and Mr Jacinto [name] in agreement. We talked about past medical history. We talked about living independent at home. He does continue to drive short distances. He retired from [employer]. He currently is separated. They have seven children between them. His sister is his POA [place] and she is a Engineer, civil (consulting)nurse. We talked about symptoms, appetite. We talked about medical goals of care including aggressive versus conservative versus comfort care. We talked about the most form and Hard Choice book. We talked about code status as his wishes are to be a DNR. Mr Jacinto [name] was in agreement for [place] form to be completed, placed in Epic/Vynca and mailed in addition to blank most form in Hard Choice book. We talked about intubation and Mr Jacinto [name] endorses that he would  not want to be intubated for an extended of time. We talked about some families or patience to setting a specific day like 3 or 5 days on a ventilator to further  discuss with his sister so they know his wishes. We talked about his appointment with Dr Patsey Berthold. Mr Blankenhorn endorses he was confused about palliative and she also mentioned Hospice Services. We talked about what Hospice Services do you provide being under Medicare benefit. We talked about currently he is driving and typically he would need to decline further for hospice. Discuss that will touch base Dr Duwayne Heck to clarify. We talked about role of palliative care and plan of care. Discuss will follow up and 4 weeks if needed or sooner should he declined. Therapeutic listening and emotional support provided. Contact information provided. Questions answered satisfaction. .  Palliative Care was asked to help address goals of care.   CODE STATUS: DNR  PPS: 60% HOSPICE ELIGIBILITY/DIAGNOSIS: TBD  PAST MEDICAL HISTORY:  Past Medical History:  Diagnosis Date  . Allergy   . Anxiety   . Asthma   . Chronic diastolic CHF (congestive heart failure) ([place])    a. echo 07/2013: EF 60-65%, DD, biatrial dilatation, Ao sclerosis, dilated RV, moderate pulmonary HTN, elevated CV and RA pressures; b. patient reported echo at Dr. Laurelyn Sickle office 02/2015 - his office does not have record of him being a pt there c. echo 11/2015: EF 60-65%, Grade 1 DD, mod-severe pulm pressures  . Chronic respiratory failure (HCC)    a. on 2L via nasal cannula; b. secondary to COPD  . COPD (chronic obstructive pulmonary disease) (HCC)    on 2L continuous   . Depression   . Emphysema of lung ([place])   . GERD (gastroesophageal reflux disease)   . HCAP (healthcare-associated pneumonia)    01/22/18-01/28/18   . Headache   . Hypertension   . Leg DVT (deep venous thromboembolism), acute, bilateral ([place])    02/26/18  . Personal history of tobacco use, presenting hazards to health 08/17/2015  . Pulmonary embolism ([place])    02/26/18  . Pulmonary HTN ([place])   . Tobacco abuse     SOCIAL HX:  Social History   Tobacco Use  . Smoking status:  Current Every Day Smoker    Packs/day: 0.10    Years: 40.00    Pack years: 4.00    Types: Cigarettes    Last attempt to quit: 01/14/2018    Years since quitting: 1.5  . Smokeless tobacco: Never Used  Substance Use Topics  . Alcohol use: No    ALLERGIES:  Allergies  Allergen Reactions  . Asa [Aspirin] Other (See Comments)    Reaction: swelling of the right side.  . Bee Venom Swelling  . Codeine Other (See Comments)    Reaction: Difficulty breathing  . Ibuprofen Other (See Comments)    Reaction: Swelling of the right side.  . Iodinated Diagnostic Agents Other (See Comments)    Reaction:  Unknown  Other reaction(s): Unknown     PERTINENT MEDICATIONS:  Outpatient Encounter Medications as of 07/22/2019  Medication Sig  . Acetylcysteine (NAC) 600 MG CAPS Take 1 tablet by mouth daily.   Marland Kitchen ALPRAZolam (XANAX) 1 MG tablet Take 1 tablet (1 mg total) by mouth 3 (three) times daily as needed for anxiety.  . AMBULATORY NON FORMULARY MEDICATION 1 each by Does not apply route as needed. Please provide patient with "So Clean" CPAP cleaning device to be used as needed.  Marland Kitchen antiseptic oral  rinse (BIOTENE) LIQD 15 mLs by Mouth Rinse route daily as needed for dry mouth.  Marland Kitchen apixaban (ELIQUIS) 5 MG TABS tablet Take 1 tablet (5 mg total) by mouth 2 (two) times daily.  Marland Kitchen b complex vitamins capsule Take 1 capsule by mouth daily.  . carbamide peroxide (DEBROX) 6.5 % OTIC solution Place 5 drops into both ears 2 (two) times daily. Prn x 4-7 days  . Cholecalciferol (VITAMIN D) 2000 units tablet Take 2,000 Units by mouth 2 (two) times a day.   . clobetasol (TEMOVATE) 0.05 % external solution Apply 1 application topically 2 (two) times daily. Prn to scalp and ears  . diltiazem (CARDIZEM) 30 MG tablet Take 1 tablet (30 mg total) by mouth 3 (three) times daily as needed. (Patient taking differently: Take 30 mg by mouth 3 (three) times daily as needed (heartrate). )  . doxycycline (VIBRA-TABS) 100 MG tablet Take 1  tablet (100 mg total) by mouth daily. For 1 more day.  . escitalopram (LEXAPRO) 20 MG tablet Take 1 tablet (20 mg total) by mouth at bedtime.  . fluticasone (FLONASE) 50 MCG/ACT nasal spray Place 2 sprays into both nostrils daily as needed for allergies or rhinitis.  . furosemide (LASIX) 40 MG tablet Take 1 tablet (40 mg) by mouth twice daily  . gabapentin (NEURONTIN) 300 MG capsule TAKE 1 TO 2 CAPSULES BY MOUTH THREE TIMES DAILY (Patient taking differently: Take 300-600 mg by mouth 3 (three) times daily. )  . hydrocortisone 2.5 % cream Apply topically 2 (two) times daily as needed. face (Patient taking differently: Apply 1 application topically 2 (two) times daily as needed (skin irritations). (apply to the face))  . ipratropium-albuterol (DUONEB) 0.5-2.5 (3) MG/3ML SOLN Take 3 mLs by nebulization every 6 (six) hours as needed. (Patient taking differently: Take 3 mLs by nebulization every 6 (six) hours as needed (shortness of breath/wheezing). )  . ketoconazole (NIZORAL) 2 % shampoo Apply 1 application topically 2 (two) times a week. Let stand for 5 minutes  . magnesium oxide (MAG-OX) 400 MG tablet Take 400 mg by mouth daily.  . mometasone-formoterol (DULERA) 200-5 MCG/ACT AERO Inhale 2 puffs into the lungs 2 (two) times daily.  Marland Kitchen nystatin (MYCOSTATIN) 100000 UNIT/ML suspension Use as directed 5 mLs (500,000 Units total) in the mouth or throat 4 (four) times daily. Swish and spit 3 or 4 times daily for at least 5 days.  Then may be used as needed thereafter for thrush  . potassium chloride (KLOR-CON) 10 MEQ tablet Take 4 tablets (40 meq) by mouth once daily (Patient taking differently: Take 20 mEq by mouth daily. )  . Probiotic Product (PROBIOTIC PO) Take 1 capsule by mouth 2 (two) times a day.   Marland Kitchen rOPINIRole (REQUIP) 0.25 MG tablet Take 1 tablet (0.25 mg total) by mouth 2 (two) times daily.  . sodium chloride (OCEAN) 0.65 % SOLN nasal spray Place 1-2 sprays into both nostrils as needed for  congestion.  Marland Kitchen Spacer/Aero-Holding Chambers (OPTICHAMBER ADVANTAGE-LG MASK) MISC Use as directed with inhaler diag  j44.1  . theophylline (UNIPHYL) 400 MG 24 hr tablet Take 1 tablet (400 mg total) by mouth every morning.  . Tiotropium Bromide Monohydrate (SPIRIVA RESPIMAT) 2.5 MCG/ACT AERS Inhale 2 puffs into the lungs daily.  . traZODone (DESYREL) 50 MG tablet Take 1-2 tablets (50-100 mg total) by mouth at bedtime as needed for sleep.   No facility-administered encounter medications on file as of 07/22/2019.     PHYSICAL EXAM:   Deferred  Christin Ihor Gully, NP

## 2019-07-23 ENCOUNTER — Ambulatory Visit: Payer: Medicare Other | Attending: Family | Admitting: Family

## 2019-07-23 ENCOUNTER — Encounter: Payer: Self-pay | Admitting: Family

## 2019-07-23 ENCOUNTER — Other Ambulatory Visit: Payer: Self-pay

## 2019-07-23 VITALS — BP 120/65 | HR 114 | Resp 24 | Ht 70.0 in | Wt 225.0 lb

## 2019-07-23 DIAGNOSIS — Z91041 Radiographic dye allergy status: Secondary | ICD-10-CM | POA: Diagnosis not present

## 2019-07-23 DIAGNOSIS — J439 Emphysema, unspecified: Secondary | ICD-10-CM | POA: Insufficient documentation

## 2019-07-23 DIAGNOSIS — Z885 Allergy status to narcotic agent status: Secondary | ICD-10-CM | POA: Diagnosis not present

## 2019-07-23 DIAGNOSIS — Z86718 Personal history of other venous thrombosis and embolism: Secondary | ICD-10-CM | POA: Insufficient documentation

## 2019-07-23 DIAGNOSIS — F419 Anxiety disorder, unspecified: Secondary | ICD-10-CM | POA: Diagnosis not present

## 2019-07-23 DIAGNOSIS — Z823 Family history of stroke: Secondary | ICD-10-CM | POA: Diagnosis not present

## 2019-07-23 DIAGNOSIS — Z886 Allergy status to analgesic agent status: Secondary | ICD-10-CM | POA: Diagnosis not present

## 2019-07-23 DIAGNOSIS — Z8249 Family history of ischemic heart disease and other diseases of the circulatory system: Secondary | ICD-10-CM | POA: Diagnosis not present

## 2019-07-23 DIAGNOSIS — F17210 Nicotine dependence, cigarettes, uncomplicated: Secondary | ICD-10-CM | POA: Diagnosis not present

## 2019-07-23 DIAGNOSIS — Z86711 Personal history of pulmonary embolism: Secondary | ICD-10-CM | POA: Diagnosis not present

## 2019-07-23 DIAGNOSIS — Z91030 Bee allergy status: Secondary | ICD-10-CM | POA: Diagnosis not present

## 2019-07-23 DIAGNOSIS — Z9981 Dependence on supplemental oxygen: Secondary | ICD-10-CM | POA: Insufficient documentation

## 2019-07-23 DIAGNOSIS — Z822 Family history of deafness and hearing loss: Secondary | ICD-10-CM | POA: Insufficient documentation

## 2019-07-23 DIAGNOSIS — Z79899 Other long term (current) drug therapy: Secondary | ICD-10-CM | POA: Diagnosis not present

## 2019-07-23 DIAGNOSIS — K219 Gastro-esophageal reflux disease without esophagitis: Secondary | ICD-10-CM | POA: Insufficient documentation

## 2019-07-23 DIAGNOSIS — I272 Pulmonary hypertension, unspecified: Secondary | ICD-10-CM | POA: Diagnosis not present

## 2019-07-23 DIAGNOSIS — Z7901 Long term (current) use of anticoagulants: Secondary | ICD-10-CM | POA: Diagnosis not present

## 2019-07-23 DIAGNOSIS — F329 Major depressive disorder, single episode, unspecified: Secondary | ICD-10-CM | POA: Diagnosis not present

## 2019-07-23 DIAGNOSIS — Z825 Family history of asthma and other chronic lower respiratory diseases: Secondary | ICD-10-CM | POA: Diagnosis not present

## 2019-07-23 DIAGNOSIS — I5032 Chronic diastolic (congestive) heart failure: Secondary | ICD-10-CM | POA: Diagnosis not present

## 2019-07-23 DIAGNOSIS — J449 Chronic obstructive pulmonary disease, unspecified: Secondary | ICD-10-CM

## 2019-07-23 DIAGNOSIS — I10 Essential (primary) hypertension: Secondary | ICD-10-CM

## 2019-07-23 DIAGNOSIS — Z7951 Long term (current) use of inhaled steroids: Secondary | ICD-10-CM | POA: Diagnosis not present

## 2019-07-23 DIAGNOSIS — I110 Hypertensive heart disease with heart failure: Secondary | ICD-10-CM | POA: Insufficient documentation

## 2019-07-23 LAB — BASIC METABOLIC PANEL
Anion gap: 9 (ref 5–15)
BUN: 14 mg/dL (ref 8–23)
CO2: 37 mmol/L — ABNORMAL HIGH (ref 22–32)
Calcium: 9.3 mg/dL (ref 8.9–10.3)
Chloride: 100 mmol/L (ref 98–111)
Creatinine, Ser: 1.07 mg/dL (ref 0.61–1.24)
GFR calc Af Amer: >60 mL/min (ref >60)
GFR calc non Af Amer: >60 mL/min (ref >60)
Glucose, Bld: 95 mg/dL (ref 70–99)
Potassium: 4.6 mmol/L (ref 3.5–5.1)
Sodium: 146 mmol/L — ABNORMAL HIGH (ref 135–145)

## 2019-07-23 NOTE — Patient Instructions (Signed)
Continue weighing daily and call for an overnight weight gain of > 2 pounds or a weekly weight gain of >5 pounds. 

## 2019-07-24 ENCOUNTER — Other Ambulatory Visit: Payer: Self-pay | Admitting: *Deleted

## 2019-07-24 ENCOUNTER — Encounter: Payer: Self-pay | Admitting: *Deleted

## 2019-07-24 NOTE — Patient Outreach (Signed)
[facility]) [place] Telephone Outreach PCP office completes Transition of Care post-hospital discharge Post-hospital discharge day # 14 Unsuccessful (consecutive) outreach attempt # 2 in previously engaged patient  07/24/2019  Dustin [name] 11/09/1955 [id number]  10:55 am: Unsuccessful telephone [name], 63 y/o male referred to [employer] by [facility] Pharmacist embedded in patient's PCP office for assessment of community resource needs and ongoing reinforcement of self- health management of chronic disease state of COPD/ CHF. Patient has had no recent inpatient hospital admissions. Patient has history including, but not limited to, chronic respiratory failure; CHF; COPD on home O2 with ongoing tobacco use; pulmonary HTN; and anxiety.  Since [facility] CM initially became involved in patient's care, he had recent hospitalization October 19-22, 2020 for COPD exacerbation/ acute on chronic respiratory failure.  Patient was discharged home to self-care with home health services.  Received automated outgoing voice message stating that patient's voice mail box is full and can not accept new messages.  Unable to leave patient voice message requesting call back.  Plan:  Will place [facility] Community CM unsuccessful patient outreach letter in mail requesting call back in writing, as this is the second consecutive unsuccessful outreach attempt in patient that was previously engaged with [facility] CM services  Will re-attempt [place] telephone outreach later within 4 business days if I do not hear back from patient first.  Oneta Rack, RN, BSN, [place] Care Management  [phone]

## 2019-07-25 ENCOUNTER — Other Ambulatory Visit: Payer: Self-pay

## 2019-07-25 ENCOUNTER — Ambulatory Visit
Admission: RE | Admit: 2019-07-25 | Discharge: 2019-07-25 | Disposition: A | Discharge status: Home / Self Care | Payer: Medicare Other | Source: Ambulatory Visit | Attending: Pulmonary Disease | Admitting: Pulmonary Disease

## 2019-07-25 ENCOUNTER — Encounter: Payer: Self-pay | Admitting: Pulmonary Disease

## 2019-07-25 DIAGNOSIS — I272 Pulmonary hypertension, unspecified: Secondary | ICD-10-CM | POA: Diagnosis not present

## 2019-07-25 DIAGNOSIS — I10 Essential (primary) hypertension: Secondary | ICD-10-CM | POA: Diagnosis not present

## 2019-07-25 NOTE — Progress Notes (Signed)
*  PRELIMINARY RESULTS* Echocardiogram 2D Echocardiogram has been performed.  Dustin [name] 07/25/2019, 11:36 AM

## 2019-07-28 ENCOUNTER — Encounter: Payer: Self-pay | Admitting: *Deleted

## 2019-07-28 ENCOUNTER — Other Ambulatory Visit: Payer: Self-pay | Admitting: *Deleted

## 2019-07-28 NOTE — Patient Outreach (Signed)
[facility]) [place] Telephone Outreach PCP office completes transition of Care outreach post-hospital discharge Post-hospital discharge day # 18  07/28/2019  Dustin [name] 02-Nov-1955 [id number]  Successful telephone Dustin [name], 63 y/o male referred to [place] by [facility] Pharmacist embedded in patient's PCP office for assessment of community resource needs and ongoing reinforcement of self- health management of chronic disease state of COPD/ CHF. Patient has had no recent inpatient hospital admissions. Patient has history including, but not limited to, chronic respiratory failure; CHF; COPD on home O2 with ongoing tobacco use; pulmonary HTN; and anxiety.  Since [facility] CM initially became involved in patient's care, he had recent hospitalization October 19-22, 2020 for COPD exacerbation/ acute on chronic respiratory failure. Patient was discharged home to self-care with home health services ordered.  HIPAA/ identity verified.  Today, patient reports he is "doing pretty good;" he denies pain and reports a (new) recent fall, "the day I got home from the hospital;" states he was outside and "lost (his) footing" in the parking lot; states he "fell into the bushes," and was able to get up without assistance/ delay.  Denies injury from fall; reports was using walker at time of fall and continues using walker for all ambulation.  Patient sounds to be in no distress throughout phone call today.  Patient further reports:  -- denies concerns/ issues around medications and continues self-managing medications; states that he continues working with [place] team on medication management.  Patient reports that he is taking z-pack "every other day" and "has been taking" for a "long time." Stated that his previous pulmonology provider put him on this medication, however, it is not listed on his medication list in EMR- I updated list according to patient  report today.  Previously reported that he does not want to take flu shot for 2020 flu season, and patient reiterates this today.  Additionally, patient mentions that he plans to ask cardiology provider at upcoming appointment about his dosing of carvedilol, stating that he thinks "3 times a day is too much;" reports that he believes that carvedilol "makes him all tired and washed out feeling."  Confirms that he has not mentioned this to his PCP, nor to [facility] Pharmacist Dustin [name]  -- has attended all provider appointments; daughter continues to transport/ attend with patient; accurately verbalizes dates of upcoming provider appointments with plans to attend all   ---- patient tells me he is now active with Community Palliative care team and he immediately states that he believes he 'told them wrong" that he wishes to be a DNR-- states that he 'wants this removed from his record;" adding that he "does not know exactly what" he wishes he code status to be.  We again thoroughly discussed basics of Advanced Directive planning, and I assured patient that Advanced Directives will be followed when/ if he is unable to speak for himself.  Patient stated several times that he placed his sister in charge as HCPOA and wants to keep her as HCPOA.  I encouraged patient to re-discuss his goals of care not only with Palliative Care team but also with his care providers, and encouraged him to maintain contact/ engagement with Palliative care team  -- has spoken to [place] around community resources for new housing and general resources; has not yet acted on the information that was provided to him; stated that he has determined that he should wait until spring of "next year" to move around  his current finances/ lease agreement; denies further community resource needs and confirms that he has ongoing Meals on Wheels  --  Still active with [facility] Chronic CM RN CM and confirms that he continues participating in [place] program;  has tablet for daily weight, SaO2 and BP monitoring.   States that he discussed possibility of obtaining new home oxygen delivery system with his [facility] RN CM, and with his pulmonary provider and that he has been given information to follow up with [facility], which he has done; states that obtaining new oxygen delivery system is still pending but he is "starting to work on it;" again discussed with patient eventual case closure since he followed by [facility] Chronic CM program as well as community palliative care program  Self-health management ofchronic disease states of COPD/ CHF: --continues using home O2 at 2 L/min through "[employer];" as noted above, looking into new home O2 delivery system -- has decreased his smoking even further- has only smoked one cigarette a day over last 2 days- positive reinforcement provided -- continues monitoring/ recording daily weights and BP readings at home, which are relayed directly to [facility] RN CM by tablet -- daily weight ranges at home continue between 225-227 lbs, with a weight today of 226 lbs -- discussed dietary strategies in self-health management of COPD/ CHF; patient endorses following heart healthy/ low salt diet -- reviewed signs/ symptoms yellow CHF/ COPD zones along with corresponding action plan and patient remains able to accurately verbalize same with minimal prompting -- Patient denies ongoing symptoms yellow COPD zone, although he continues to admit to activity intolerance and SOB as his baseline.    Patient denies further issues, concerns, or problems today. Iconfirmed that patient hasmy direct phone number, the main [facility] CM office phone number, and the [facility] CM 24-hour nurse advice phone number should issues arise prior to next scheduled [place] outreach, which we scheduled today for next month. Encouraged patient to contact me directly if needs, questions, issues, or concerns arise prior to next scheduled outreach; patient agreed to do  so.  Plan:  Patient will take medications as prescribed and will attend all scheduled provider appointments  Patient will promptly notify care providers for any new concerns/ issues/ problems that arise  Patient will continue monitoring/ recording daily weightsand will maintain communication with community palliative care team   Patient will maintain communication with [place] team currently involved in his care  I will send today's [facility] CM notes to community palliative care team to update on conversation with patient around advanced Directives, and to [facility] pharmacist/ PCP/ pulmonology provider to update on patient's report of current medications  Patient will continue his efforts to decrease/ stop smoking  [facility] Community CM outreach to continue with scheduled phone call next month  [facility] CM Care Plan Problem One     Most Recent Value  Care Plan Problem One  Need for ongoing reinforcement of community resources in patient who lives alone with limited family support, as evidenced by patient reporting  Role Documenting the Problem One  Care Management Coordinator  Care Plan for Problem One  Not Active  [facility] Long Term Goal   Over the next 45 days, patient will verbalize options for new housing resources and for community resources available to him, as evidenced by patient reporting and [facility] CSW collaboration as indicated during [facility] RN CM outreach  [place] Term Goal Start Date  06/10/19  [facility] Long Term Goal Met Date  07/28/19 [Goal Met]  Interventions for Problem One Long Term Goal  Confirmed that patient spoke with [facility] BSW and received resources as requested    [facility] CM Care Plan Problem Two     Most Recent Value  Care Plan Problem Two  Need for ongoing reinforcement of self-health management of multiple chronic disease conditions, including COPD and CHF in patient who continues to smoke, as evidenced by patient reporting  Role Documenting the Problem Two  Care Management [place] for Problem Two  Active  Interventions for Problem Two Long Term Goal   Discussed patient's breathing status/ use of home O2,  current overall clinical condition and signs/ symptoms COPD exacerbation with corresponding action plan for same,  discussed patient's use of medications and his decrease in smoking to 1-2 / day  [facility] Long Term Goal  Over the next 25 days, patient will verbalize accurate understanding of daily self-health management strategies/ plan for COPD and CHF, as evidenced by patient reporting during [facility] RN CM outreach [Goal extended today]  [facility] Long Term Goal Start Date  07/28/19- goal extended today  [facility] CM Short Term Goal #1   Over the next 30 days, patient will take medications as prescribed, as evidenced by patient reporting and collaboration with [facility] Pharmacist during [facility] RN CM Outreach  [facility] CM Short Term Goal #1 Start Date  06/18/19  [facility] CM Short Term Goal #1 Met Date   07/28/19 [Goal met]  Interventions for Short Term Goal #2   Reviewed patient's medications with him and updated medication list in EMR,  sent [facility] Pharmacist copy of updates per patient report today,  confirmed that patient has no medication concerns and continues self-managing medications,  encouraged patient's ongoing communication with [facility] Pharmacist and care providers should medication concerns arise  [facility] CM Short Term Goal #2   Over the next 30 days, patient will continue working with his health insurance plan/ RN CM and verbalize process to obtain new home oxygen system, as evidenced by patient reporting and collaboration with patient's insurance provider as indicated during [facility] RN CM Outreach  [facility] CM Short Term Goal #2 Start Date  06/18/19  [facility] CM Short Term Goal #2 Met Date  07/28/19 [Goal met]  Interventions for Short Term Goal #2  Confirmed that patient has spoken with his pulomary care provider and his [facility] RN CM,  confirmed that he has been contacted by DME company around process to obtain new  home O2 delivery system,  encouraged patient to act promptly with DME company to obtain new system    [facility] CM Care Plan Problem Three     Most Recent Value  Care Plan Problem Three  High risk fo readmission related to recent hospitalization for COPD exacerbation 10/19- 10/23   Role Documenting the Problem Three  Care Management [place] for Problem Three  Active  [facility] Long Term Goal   Over the next 25 days patient will not experince a hospital readmission as evidenced by patient reporting and review of EMR during [facility] RN CM outreach [goal re-stated/ extended today]  [place] Term Goal Start Date  07/28/19 [Goal extended today]  Interventions for Problem Three Long Term Goal  Confirmed that patient has no current clinical concerns and understands to promptly contact care providers for any new concerns/ issues/ problems that arise; discussed role of newly added community palliative care team and encouraged patient to maintain communication with palliative care team  [facility] CM Short  Term Goal #1   Over the next 20 days patient will be able to report attending all medical appointments.   [facility] CM Short Term Goal #1 Start Date  07/11/19  [facility] CM Short Term Goal #1 Met Date  07/28/19 [Goal Met]  Interventions for Short Term Goal #1  reviewed recent provider appointments with patient and confoirmed that he has accurate understanding of all upcoming provider appointments with stated plans to attend all,  confirmed that patient has ongoing transportation thorugh family member who attends appointments with him     Oneta Rack, RN, BSN, [employer] Coordinator [facility] Care Management  [phone]

## 2019-07-31 ENCOUNTER — Ambulatory Visit (INDEPENDENT_AMBULATORY_CARE_PROVIDER_SITE_OTHER): Payer: Medicare Other | Admitting: Pharmacist

## 2019-07-31 DIAGNOSIS — J439 Emphysema, unspecified: Secondary | ICD-10-CM | POA: Diagnosis not present

## 2019-07-31 DIAGNOSIS — R000 Tachycardia, unspecified: Secondary | ICD-10-CM

## 2019-07-31 NOTE — Chronic Care Management (AMB) (Signed)
Chronic Care Management   Follow Up Note   07/31/2019 Name: Dustin [name] MRN: [id number] DOB: 09-Oct-1955  Referred by: McLean-Scocuzza, Pasty Spillers, MD Reason for referral : Chronic Care Management (Medication Management)   Dustin [name] is a 63 y.o. year old male who is a primary care patient of McLean-Scocuzza, Pasty Spillers, MD. The CCM team was consulted for assistance with chronic disease management and care coordination needs.   Received call from patient today with some medication questions.   Review of patient status, including review of consultants reports, relevant laboratory and other test results, and collaboration with appropriate care team members and the patient's provider was performed as part of comprehensive patient evaluation and provision of chronic care management services.    SDOH (Social Determinants of Health) screening performed today: Stress. See Care Plan for related entries.   Outpatient Encounter Medications as of 07/31/2019  Medication Sig Note  . Acetylcysteine (NAC) 600 MG CAPS Take 1 tablet by mouth daily.    Marland Kitchen ALPRAZolam (XANAX) 1 MG tablet Take 1 tablet (1 mg total) by mouth 3 (three) times daily as needed for anxiety.   . AMBULATORY NON FORMULARY MEDICATION 1 each by Does not apply route as needed. Please provide patient with "So Clean" CPAP cleaning device to be used as needed. (Patient not taking: Reported on 07/28/2019)   . antiseptic oral rinse (BIOTENE) LIQD 15 mLs by Mouth Rinse route daily as needed for dry mouth.   Marland Kitchen apixaban (ELIQUIS) 5 MG TABS tablet Take 1 tablet (5 mg total) by mouth 2 (two) times daily.   Marland Kitchen azithromycin (ZITHROMAX) 250 MG tablet Take 250 mg by mouth daily. Take one tablet by mouth every other day- patient reports that this was prescribed by pulmonary provider in October 2020   . b complex vitamins capsule Take 1 capsule by mouth daily.   . carbamide peroxide (DEBROX) 6.5 % OTIC solution Place 5 drops into both ears 2 (two)  times daily. Prn x 4-7 days   . Cholecalciferol (VITAMIN D) 2000 units tablet Take 2,000 Units by mouth 2 (two) times a day.    . clobetasol (TEMOVATE) 0.05 % external solution Apply 1 application topically 2 (two) times daily. Prn to scalp and ears   . diltiazem (CARDIZEM) 30 MG tablet Take 1 tablet (30 mg total) by mouth 3 (three) times daily as needed. (Patient taking differently: Take 30 mg by mouth 3 (three) times daily as needed (heartrate). )   . doxycycline (VIBRA-TABS) 100 MG tablet Take 1 tablet (100 mg total) by mouth daily. For 1 more day. (Patient not taking: Reported on 07/28/2019)   . escitalopram (LEXAPRO) 20 MG tablet Take 1 tablet (20 mg total) by mouth at bedtime.   . fluticasone (FLONASE) 50 MCG/ACT nasal spray Place 2 sprays into both nostrils daily as needed for allergies or rhinitis.   . furosemide (LASIX) 40 MG tablet Take 1 tablet (40 mg) by mouth twice daily 07/17/2019: Alternating 40 mg daily, then 80 mg    . gabapentin (NEURONTIN) 300 MG capsule TAKE 1 TO 2 CAPSULES BY MOUTH THREE TIMES DAILY (Patient taking differently: Take 300-600 mg by mouth 3 (three) times daily. )   . hydrocortisone 2.5 % cream Apply topically 2 (two) times daily as needed. face (Patient taking differently: Apply 1 application topically 2 (two) times daily as needed (skin irritations). (apply to the face))   . ipratropium-albuterol (DUONEB) 0.5-2.5 (3) MG/3ML SOLN Take 3 mLs by nebulization every 6 (six)  hours as needed. (Patient taking differently: Take 3 mLs by nebulization every 6 (six) hours as needed (shortness of breath/wheezing). )   . ketoconazole (NIZORAL) 2 % shampoo Apply 1 application topically 2 (two) times a week. Let stand for 5 minutes   . magnesium oxide (MAG-OX) 400 MG tablet Take 400 mg by mouth daily.   . mometasone-formoterol (DULERA) 200-5 MCG/ACT AERO Inhale 2 puffs into the lungs 2 (two) times daily.   Marland Kitchen. nystatin (MYCOSTATIN) 100000 UNIT/ML suspension Use as directed 5 mLs  (500,000 Units total) in the mouth or throat 4 (four) times daily. Swish and spit 3 or 4 times daily for at least 5 days.  Then may be used as needed thereafter for thrush   . potassium chloride (KLOR-CON) 10 MEQ tablet Take 4 tablets (40 meq) by mouth once daily (Patient taking differently: Take 20 mEq by mouth daily. 20 every other day, alternate with 40 every other day)   . Probiotic Product (PROBIOTIC PO) Take 1 capsule by mouth 2 (two) times a day.    Marland Kitchen. rOPINIRole (REQUIP) 0.25 MG tablet Take 1 tablet (0.25 mg total) by mouth 2 (two) times daily.   . sodium chloride (OCEAN) 0.65 % SOLN nasal spray Place 1-2 sprays into both nostrils as needed for congestion.   Marland Kitchen. Spacer/Aero-Holding Chambers (OPTICHAMBER ADVANTAGE-LG MASK) MISC Use as directed with inhaler diag  j44.1   . theophylline (UNIPHYL) 400 MG 24 hr tablet Take 1 tablet (400 mg total) by mouth every morning.   . Tiotropium Bromide Monohydrate (SPIRIVA RESPIMAT) 2.5 MCG/ACT AERS Inhale 2 puffs into the lungs daily.   . traZODone (DESYREL) 50 MG tablet Take 1-2 tablets (50-100 mg total) by mouth at bedtime as needed for sleep.    No facility-administered encounter medications on file as of 07/31/2019.      Goals Addressed            This Visit's Progress     Patient Stated   . "I want to stay well" (pt-stated)       Current Barriers:  . Polypharmacy - patient with COPD, CHF, Afib, anxiety . Financial concerns- resolved; patient is receiving Dulera (Merck), Spiriva (BI), and Eliquis (BMS) patient assistance through 09/18/2019. o CHF/Afib; follows w/ Dr. Mariah MillingGollan and HF clinic - Eliquis 5 mg BID, diltiazem 30 mg PRN tachycardia - notes that lately, he has been feeling more tired when he takes diltizem more than twice daily; is unable to verbalize any parameters for when he takes diltiazem - Furosemide 40 mg one day, 80 mg the next, etc o COPD; recent hospitalization 10/19-10/22 for COPD exacerbation, follows w/ Dr. Jayme CloudGonzalez -  Dulera 200 mg BID + Spiriva 18 mcg daily, azithromycin 250 QOD, theophylline 400 mg QAM, Duonebs PRN.  - Patient recently established w/ palliative care, and wants to make it clear that he does NOT want to be DNR - Notes his biggest concern today is a dry mouth/dry throat. Notes he would use Biotene mouth wash throughout the day if possible  Pharmacist Clinical Goal(s):  Marland Kitchen. Over the next 90 days, patient will work with PharmD, primary care provider, and specialty providers to address needs related to optimized medication management  Interventions: . Discussed DNR status. He also discussed this w/ Iline OvenLainey Tousey, RN CM . Reviewed medications. Consider minimization of any anticholinergic medications moving forward. Will discuss benefits vs sx of ropinirole therapy moving forward.  . Encouraged to discuss parameters for diltiazem use w/ Dr. Mariah MillingGollan at upcoming appointment. Patient  verbalized understanding  Patient Self Care Activities:  . Self administers medications as prescribed  Please see past updates related to this goal by clicking on the "Past Updates" button in the selected goal         Plan: - Will outreach patient in ~4 weeks after upcoming cardiology and pulmonary appointments.   Catie Darnelle Maffucci, PharmD, [place] Pharmacist [facility] [phone]

## 2019-07-31 NOTE — Patient Instructions (Signed)
Visit Information  Goals Addressed            This Visit's Progress     Patient Stated   . "I want to stay well" (pt-stated)       Current Barriers:  . Polypharmacy - patient with COPD, CHF, Afib, anxiety . Financial concerns- resolved; patient is receiving Dulera (Merck), Spiriva (BI), and Eliquis (BMS) patient assistance through 09/18/2019. o CHF/Afib; follows w/ Dr. Rockey Situ and HF clinic - Eliquis 5 mg BID, diltiazem 30 mg PRN tachycardia - notes that lately, he has been feeling more tired when he takes diltizem more than twice daily; is unable to verbalize any parameters for when he takes diltiazem - Furosemide 40 mg one day, 80 mg the next, etc o COPD; recent hospitalization 10/19-10/22 for COPD exacerbation, follows w/ Dr. Patsey Berthold - Dulera 200 mg BID + Spiriva 18 mcg daily, azithromycin 250 QOD, theophylline 400 mg QAM, Duonebs PRN.  - Patient recently established w/ palliative care, and wants to make it clear that he does NOT want to be DNR - Notes his biggest concern today is a dry mouth/dry throat. Notes he would use Biotene mouth wash throughout the day if possible  Pharmacist Clinical Goal(s):  Marland Kitchen Over the next 90 days, patient will work with PharmD, primary care provider, and specialty providers to address needs related to optimized medication management  Interventions: . Discussed DNR status. He also discussed this w/ Arnette Felts, RN CM . Reviewed medications. Consider minimization of any anticholinergic medications moving forward. Will discuss benefits vs sx of ropinirole therapy moving forward.  . Encouraged to discuss parameters for diltiazem use w/ Dr. Rockey Situ at upcoming appointment. Patient verbalized understanding  Patient Self Care Activities:  . Self administers medications as prescribed  Please see past updates related to this goal by clicking on the "Past Updates" button in the selected goal         The patient verbalized understanding of instructions  provided today and declined a print copy of patient instruction materials.   Plan: - Will outreach patient in ~4 weeks after upcoming cardiology and pulmonary appointments.   Catie Darnelle Maffucci, PharmD, [place] Pharmacist [facility] [place] [phone]

## 2019-08-01 NOTE — Progress Notes (Signed)
Thank you :)

## 2019-08-04 ENCOUNTER — Ambulatory Visit: Payer: Self-pay | Admitting: Pharmacist

## 2019-08-04 DIAGNOSIS — J439 Emphysema, unspecified: Secondary | ICD-10-CM

## 2019-08-04 NOTE — Chronic Care Management (AMB) (Signed)
Chronic Care Management   Follow Up Note   08/04/2019 Name: Dustin [name] MRN: [id number] DOB: 12/12/1955  Referred by: McLean-Scocuzza, Pasty Spillers, MD Reason for referral : Chronic Care Management (Medication Management)   Dustin [name] is a 63 y.o. year old male who is a primary care patient of McLean-Scocuzza, Pasty Spillers, MD. The CCM team was consulted for assistance with chronic disease management and care coordination needs.    Received call from patient with questions about 2021 applications.  Review of patient status, including review of consultants reports, relevant laboratory and other test results, and collaboration with appropriate care team members and the patient's provider was performed as part of comprehensive patient evaluation and provision of chronic care management services.    SDOH (Social Determinants of Health) screening performed today: Financial Strain . See Care Plan for related entries.   Outpatient Encounter Medications as of 08/04/2019  Medication Sig Note  . Acetylcysteine (NAC) 600 MG CAPS Take 1 tablet by mouth daily.    Marland Kitchen ALPRAZolam (XANAX) 1 MG tablet Take 1 tablet (1 mg total) by mouth 3 (three) times daily as needed for anxiety.   . AMBULATORY NON FORMULARY MEDICATION 1 each by Does not apply route as needed. Please provide patient with "So Clean" CPAP cleaning device to be used as needed. (Patient not taking: Reported on 07/28/2019)   . antiseptic oral rinse (BIOTENE) LIQD 15 mLs by Mouth Rinse route daily as needed for dry mouth.   Marland Kitchen apixaban (ELIQUIS) 5 MG TABS tablet Take 1 tablet (5 mg total) by mouth 2 (two) times daily.   Marland Kitchen azithromycin (ZITHROMAX) 250 MG tablet Take 250 mg by mouth daily. Take one tablet by mouth every other day- patient reports that this was prescribed by pulmonary provider in October 2020   . b complex vitamins capsule Take 1 capsule by mouth daily.   . carbamide peroxide (DEBROX) 6.5 % OTIC solution Place 5 drops into both  ears 2 (two) times daily. Prn x 4-7 days   . Cholecalciferol (VITAMIN D) 2000 units tablet Take 2,000 Units by mouth 2 (two) times a day.    . clobetasol (TEMOVATE) 0.05 % external solution Apply 1 application topically 2 (two) times daily. Prn to scalp and ears   . diltiazem (CARDIZEM) 30 MG tablet Take 1 tablet (30 mg total) by mouth 3 (three) times daily as needed. (Patient taking differently: Take 30 mg by mouth 3 (three) times daily as needed (heartrate). )   . doxycycline (VIBRA-TABS) 100 MG tablet Take 1 tablet (100 mg total) by mouth daily. For 1 more day. (Patient not taking: Reported on 07/28/2019)   . escitalopram (LEXAPRO) 20 MG tablet Take 1 tablet (20 mg total) by mouth at bedtime.   . fluticasone (FLONASE) 50 MCG/ACT nasal spray Place 2 sprays into both nostrils daily as needed for allergies or rhinitis.   . furosemide (LASIX) 40 MG tablet Take 1 tablet (40 mg) by mouth twice daily 07/17/2019: Alternating 40 mg daily, then 80 mg    . gabapentin (NEURONTIN) 300 MG capsule TAKE 1 TO 2 CAPSULES BY MOUTH THREE TIMES DAILY (Patient taking differently: Take 300-600 mg by mouth 3 (three) times daily. )   . hydrocortisone 2.5 % cream Apply topically 2 (two) times daily as needed. face (Patient taking differently: Apply 1 application topically 2 (two) times daily as needed (skin irritations). (apply to the face))   . ipratropium-albuterol (DUONEB) 0.5-2.5 (3) MG/3ML SOLN Take 3 mLs by nebulization every  6 (six) hours as needed. (Patient taking differently: Take 3 mLs by nebulization every 6 (six) hours as needed (shortness of breath/wheezing). )   . ketoconazole (NIZORAL) 2 % shampoo Apply 1 application topically 2 (two) times a week. Let stand for 5 minutes   . magnesium oxide (MAG-OX) 400 MG tablet Take 400 mg by mouth daily.   . mometasone-formoterol (DULERA) 200-5 MCG/ACT AERO Inhale 2 puffs into the lungs 2 (two) times daily.   Marland Kitchen nystatin (MYCOSTATIN) 100000 UNIT/ML suspension Use as directed  5 mLs (500,000 Units total) in the mouth or throat 4 (four) times daily. Swish and spit 3 or 4 times daily for at least 5 days.  Then may be used as needed thereafter for thrush   . potassium chloride (KLOR-CON) 10 MEQ tablet Take 4 tablets (40 meq) by mouth once daily (Patient taking differently: Take 20 mEq by mouth daily. 20 every other day, alternate with 40 every other day)   . Probiotic Product (PROBIOTIC PO) Take 1 capsule by mouth 2 (two) times a day.    Marland Kitchen rOPINIRole (REQUIP) 0.25 MG tablet Take 1 tablet (0.25 mg total) by mouth 2 (two) times daily.   . sodium chloride (OCEAN) 0.65 % SOLN nasal spray Place 1-2 sprays into both nostrils as needed for congestion.   Marland Kitchen Spacer/Aero-Holding Chambers (OPTICHAMBER ADVANTAGE-LG MASK) MISC Use as directed with inhaler diag  j44.1   . theophylline (UNIPHYL) 400 MG 24 hr tablet Take 1 tablet (400 mg total) by mouth every morning.   . Tiotropium Bromide Monohydrate (SPIRIVA RESPIMAT) 2.5 MCG/ACT AERS Inhale 2 puffs into the lungs daily.   . traZODone (DESYREL) 50 MG tablet Take 1-2 tablets (50-100 mg total) by mouth at bedtime as needed for sleep.    No facility-administered encounter medications on file as of 08/04/2019.      Goals Addressed            This Visit's Progress     Patient Stated   . "I want to stay well" (pt-stated)       Current Barriers:  . Polypharmacy - patient with COPD, CHF, Afib, anxiety . Financial concerns- resolved; patient is receiving Dulera ([employer]), Spiriva (BI), and Eliquis (BMS) patient assistance through 09/18/2019. Marland Kitchen Patient called me today w/ questions about 2021  o CHF/Afib; follows w/ Dr. Rockey Situ and HF clinic - Eliquis 5 mg BID, diltiazem 30 mg PRN tachycardia  - Furosemide 40 mg one day, 80 mg the next, etc o COPD; follows w/ Dr. Patsey Berthold - Dulera 200 mg BID + Spiriva 18 mcg daily, azithromycin 250 QOD, theophylline 400 mg QAM, Duonebs PRN.  - Patient recently established w/ palliative care, and wants to  make it clear that he does NOT want to be DNR - Notes his biggest concern today is a dry mouth/dry throat. Notes he would use Biotene mouth wash throughout the day if possible  Pharmacist Clinical Goal(s):  Marland Kitchen Over the next 90 days, patient will work with PharmD, primary care provider, and specialty providers to address needs related to optimized medication management  Interventions: . Discussed plans for 2021 patient assistance with patient. [employer] Mann[facility] is likely changing their program at some point in 2021, but will be available to start. This past year, he had to hit the Coverage Gap before he could obtain Spiriva. He has to meet out of pocket spend to receive Eliquis. Will collaborate w/ Susy Frizzle, CPhT to determine the best plan for moving forward. It may be most feasible in  the long run [facility] Trelegy, to only have 1 copay/total drug spend.   Patient Self Care Activities:  . Self administers medications as prescribed  Please see past updates related to this goal by clicking on the "Past Updates" button in the selected goal          Plan: - Will collaborate with medication access technician to determine best plan moving forward  Catie Feliz Beamravis, PharmD, CPP Clinical Pharmacist [facility] [name] [phone]

## 2019-08-04 NOTE — Patient Instructions (Signed)
Visit Information  Goals Addressed            This Visit's Progress     Patient Stated   . "I want to stay well" (pt-stated)       Current Barriers:  . Polypharmacy - patient with COPD, CHF, Afib, anxiety . Financial concerns- resolved; patient is receiving Dulera ([employer]), Spiriva (BI), and Eliquis (BMS) patient assistance through 09/18/2019. Marland Kitchen Patient called me today w/ questions about 2021  o CHF/Afib; follows w/ Dr. Rockey Situ and HF clinic - Eliquis 5 mg BID, diltiazem 30 mg PRN tachycardia  - Furosemide 40 mg one day, 80 mg the next, etc o COPD; follows w/ Dr. Patsey Berthold - Dulera 200 mg BID + Spiriva 18 mcg daily, azithromycin 250 QOD, theophylline 400 mg QAM, Duonebs PRN.  - Patient recently established w/ palliative care, and wants to make it clear that he does NOT want to be DNR - Notes his biggest concern today is a dry mouth/dry throat. Notes he would use Biotene mouth wash throughout the day if possible  Pharmacist Clinical Goal(s):  Marland Kitchen Over the next 90 days, patient will work with PharmD, primary care provider, and specialty providers to address needs related to optimized medication management  Interventions: . Discussed plans for 2021 patient assistance with patient. [employer] Mann[facility] is likely changing their program at some point in 2021, but will be available to start. This past year, he had to hit the Coverage Gap before he could obtain Spiriva. He has to meet out of pocket spend to receive Eliquis. Will collaborate w/ Susy Frizzle, CPhT to determine the best plan for moving forward. It may be most feasible in the long run to switch patient from [place] to Trelegy, to only have 1 copay/total drug spend.   Patient Self Care Activities:  . Self administers medications as prescribed  Please see past updates related to this goal by clicking on the "Past Updates" button in the selected goal         The patient verbalized understanding of instructions provided today and declined  a print copy of patient instruction materials.   Plan: - Will collaborate with medication access technician to determine best plan moving forward  Catie Darnelle Maffucci, PharmD, [place] Pharmacist [facility] [place] [phone]

## 2019-08-05 ENCOUNTER — Inpatient Hospital Stay: Payer: Medicare Other

## 2019-08-05 ENCOUNTER — Encounter: Payer: Self-pay | Admitting: Emergency Medicine

## 2019-08-05 ENCOUNTER — Emergency Department: Payer: Medicare Other

## 2019-08-05 ENCOUNTER — Other Ambulatory Visit: Payer: Self-pay | Admitting: *Deleted

## 2019-08-05 ENCOUNTER — Inpatient Hospital Stay
Admission: EM | Admit: 2019-08-05 | Discharge: 2019-09-19 | DRG: 870 | Disposition: E | Payer: Medicare Other | Attending: Pulmonary Disease | Admitting: Pulmonary Disease

## 2019-08-05 DIAGNOSIS — Z7901 Long term (current) use of anticoagulants: Secondary | ICD-10-CM

## 2019-08-05 DIAGNOSIS — Z515 Encounter for palliative care: Secondary | ICD-10-CM | POA: Diagnosis not present

## 2019-08-05 DIAGNOSIS — R042 Hemoptysis: Secondary | ICD-10-CM | POA: Diagnosis not present

## 2019-08-05 DIAGNOSIS — J441 Chronic obstructive pulmonary disease with (acute) exacerbation: Secondary | ICD-10-CM | POA: Diagnosis not present

## 2019-08-05 DIAGNOSIS — Z886 Allergy status to analgesic agent status: Secondary | ICD-10-CM

## 2019-08-05 DIAGNOSIS — J9602 Acute respiratory failure with hypercapnia: Secondary | ICD-10-CM | POA: Diagnosis not present

## 2019-08-05 DIAGNOSIS — I272 Pulmonary hypertension, unspecified: Secondary | ICD-10-CM | POA: Diagnosis present

## 2019-08-05 DIAGNOSIS — Z79899 Other long term (current) drug therapy: Secondary | ICD-10-CM

## 2019-08-05 DIAGNOSIS — J9601 Acute respiratory failure with hypoxia: Secondary | ICD-10-CM | POA: Diagnosis present

## 2019-08-05 DIAGNOSIS — Z20828 Contact with and (suspected) exposure to other viral communicable diseases: Secondary | ICD-10-CM | POA: Diagnosis present

## 2019-08-05 DIAGNOSIS — J181 Lobar pneumonia, unspecified organism: Secondary | ICD-10-CM | POA: Diagnosis not present

## 2019-08-05 DIAGNOSIS — F05 Delirium due to known physiological condition: Secondary | ICD-10-CM | POA: Diagnosis not present

## 2019-08-05 DIAGNOSIS — I471 Supraventricular tachycardia: Secondary | ICD-10-CM | POA: Diagnosis not present

## 2019-08-05 DIAGNOSIS — Z885 Allergy status to narcotic agent status: Secondary | ICD-10-CM

## 2019-08-05 DIAGNOSIS — J90 Pleural effusion, not elsewhere classified: Secondary | ICD-10-CM | POA: Diagnosis not present

## 2019-08-05 DIAGNOSIS — R918 Other nonspecific abnormal finding of lung field: Secondary | ICD-10-CM | POA: Diagnosis not present

## 2019-08-05 DIAGNOSIS — Z86711 Personal history of pulmonary embolism: Secondary | ICD-10-CM

## 2019-08-05 DIAGNOSIS — R0489 Hemorrhage from other sites in respiratory passages: Secondary | ICD-10-CM | POA: Diagnosis not present

## 2019-08-05 DIAGNOSIS — J9600 Acute respiratory failure, unspecified whether with hypoxia or hypercapnia: Secondary | ICD-10-CM

## 2019-08-05 DIAGNOSIS — R0602 Shortness of breath: Secondary | ICD-10-CM | POA: Diagnosis not present

## 2019-08-05 DIAGNOSIS — R451 Restlessness and agitation: Secondary | ICD-10-CM | POA: Diagnosis not present

## 2019-08-05 DIAGNOSIS — J9621 Acute and chronic respiratory failure with hypoxia: Secondary | ICD-10-CM

## 2019-08-05 DIAGNOSIS — J302 Other seasonal allergic rhinitis: Secondary | ICD-10-CM | POA: Diagnosis present

## 2019-08-05 DIAGNOSIS — J80 Acute respiratory distress syndrome: Secondary | ICD-10-CM | POA: Diagnosis not present

## 2019-08-05 DIAGNOSIS — R5381 Other malaise: Secondary | ICD-10-CM | POA: Diagnosis present

## 2019-08-05 DIAGNOSIS — I5033 Acute on chronic diastolic (congestive) heart failure: Secondary | ICD-10-CM | POA: Diagnosis present

## 2019-08-05 DIAGNOSIS — I5032 Chronic diastolic (congestive) heart failure: Secondary | ICD-10-CM | POA: Diagnosis not present

## 2019-08-05 DIAGNOSIS — Z22322 Carrier or suspected carrier of Methicillin resistant Staphylococcus aureus: Secondary | ICD-10-CM

## 2019-08-05 DIAGNOSIS — Z91041 Radiographic dye allergy status: Secondary | ICD-10-CM

## 2019-08-05 DIAGNOSIS — R0689 Other abnormalities of breathing: Secondary | ICD-10-CM | POA: Diagnosis not present

## 2019-08-05 DIAGNOSIS — Z86718 Personal history of other venous thrombosis and embolism: Secondary | ICD-10-CM

## 2019-08-05 DIAGNOSIS — Z66 Do not resuscitate: Secondary | ICD-10-CM | POA: Diagnosis not present

## 2019-08-05 DIAGNOSIS — A419 Sepsis, unspecified organism: Principal | ICD-10-CM | POA: Diagnosis present

## 2019-08-05 DIAGNOSIS — I82493 Acute embolism and thrombosis of other specified deep vein of lower extremity, bilateral: Secondary | ICD-10-CM | POA: Diagnosis present

## 2019-08-05 DIAGNOSIS — J9811 Atelectasis: Secondary | ICD-10-CM | POA: Diagnosis present

## 2019-08-05 DIAGNOSIS — N179 Acute kidney failure, unspecified: Secondary | ICD-10-CM | POA: Diagnosis present

## 2019-08-05 DIAGNOSIS — I110 Hypertensive heart disease with heart failure: Secondary | ICD-10-CM | POA: Diagnosis present

## 2019-08-05 DIAGNOSIS — G4700 Insomnia, unspecified: Secondary | ICD-10-CM | POA: Diagnosis present

## 2019-08-05 DIAGNOSIS — J449 Chronic obstructive pulmonary disease, unspecified: Secondary | ICD-10-CM | POA: Diagnosis not present

## 2019-08-05 DIAGNOSIS — J15212 Pneumonia due to Methicillin resistant Staphylococcus aureus: Secondary | ICD-10-CM | POA: Diagnosis present

## 2019-08-05 DIAGNOSIS — J9620 Acute and chronic respiratory failure, unspecified whether with hypoxia or hypercapnia: Secondary | ICD-10-CM | POA: Diagnosis not present

## 2019-08-05 DIAGNOSIS — R6521 Severe sepsis with septic shock: Secondary | ICD-10-CM | POA: Diagnosis present

## 2019-08-05 DIAGNOSIS — D638 Anemia in other chronic diseases classified elsewhere: Secondary | ICD-10-CM | POA: Diagnosis present

## 2019-08-05 DIAGNOSIS — F329 Major depressive disorder, single episode, unspecified: Secondary | ICD-10-CM | POA: Diagnosis present

## 2019-08-05 DIAGNOSIS — J440 Chronic obstructive pulmonary disease with acute lower respiratory infection: Secondary | ICD-10-CM | POA: Diagnosis present

## 2019-08-05 DIAGNOSIS — G9349 Other encephalopathy: Secondary | ICD-10-CM | POA: Diagnosis present

## 2019-08-05 DIAGNOSIS — I82403 Acute embolism and thrombosis of unspecified deep veins of lower extremity, bilateral: Secondary | ICD-10-CM | POA: Diagnosis present

## 2019-08-05 DIAGNOSIS — R000 Tachycardia, unspecified: Secondary | ICD-10-CM | POA: Diagnosis not present

## 2019-08-05 DIAGNOSIS — J189 Pneumonia, unspecified organism: Secondary | ICD-10-CM | POA: Diagnosis not present

## 2019-08-05 DIAGNOSIS — J471 Bronchiectasis with (acute) exacerbation: Secondary | ICD-10-CM | POA: Diagnosis not present

## 2019-08-05 DIAGNOSIS — I2781 Cor pulmonale (chronic): Secondary | ICD-10-CM | POA: Diagnosis present

## 2019-08-05 DIAGNOSIS — R570 Cardiogenic shock: Secondary | ICD-10-CM | POA: Diagnosis present

## 2019-08-05 DIAGNOSIS — K219 Gastro-esophageal reflux disease without esophagitis: Secondary | ICD-10-CM | POA: Diagnosis present

## 2019-08-05 DIAGNOSIS — J9690 Respiratory failure, unspecified, unspecified whether with hypoxia or hypercapnia: Secondary | ICD-10-CM

## 2019-08-05 DIAGNOSIS — Z8249 Family history of ischemic heart disease and other diseases of the circulatory system: Secondary | ICD-10-CM

## 2019-08-05 DIAGNOSIS — Z9981 Dependence on supplemental oxygen: Secondary | ICD-10-CM

## 2019-08-05 DIAGNOSIS — Z7952 Long term (current) use of systemic steroids: Secondary | ICD-10-CM

## 2019-08-05 DIAGNOSIS — Z4682 Encounter for fitting and adjustment of non-vascular catheter: Secondary | ICD-10-CM | POA: Diagnosis not present

## 2019-08-05 DIAGNOSIS — Z91030 Bee allergy status: Secondary | ICD-10-CM

## 2019-08-05 DIAGNOSIS — F419 Anxiety disorder, unspecified: Secondary | ICD-10-CM | POA: Diagnosis present

## 2019-08-05 DIAGNOSIS — G2581 Restless legs syndrome: Secondary | ICD-10-CM | POA: Diagnosis present

## 2019-08-05 DIAGNOSIS — Z7189 Other specified counseling: Secondary | ICD-10-CM | POA: Diagnosis not present

## 2019-08-05 DIAGNOSIS — I509 Heart failure, unspecified: Secondary | ICD-10-CM | POA: Diagnosis not present

## 2019-08-05 DIAGNOSIS — F17210 Nicotine dependence, cigarettes, uncomplicated: Secondary | ICD-10-CM | POA: Diagnosis present

## 2019-08-05 DIAGNOSIS — Z79891 Long term (current) use of opiate analgesic: Secondary | ICD-10-CM

## 2019-08-05 LAB — BLOOD GAS, ARTERIAL
Acid-Base Excess: 6.8 mmol/L — ABNORMAL HIGH (ref 0.0–2.0)
Acid-Base Excess: 8.7 mmol/L — ABNORMAL HIGH (ref 0.0–2.0)
Acid-Base Excess: 10.6 mmol/L — ABNORMAL HIGH (ref 0.0–2.0)
Bicarbonate: 39.2 mmol/L — ABNORMAL HIGH (ref 20.0–28.0)
Bicarbonate: 38.5 mmol/L — ABNORMAL HIGH (ref 20.0–28.0)
Bicarbonate: 35.7 mmol/L — ABNORMAL HIGH (ref 20.0–28.0)
Delivery systems: POSITIVE
Expiratory PAP: 8
FIO2: 0.6
FIO2: 0.4
FIO2: 0.5
Inspiratory PAP: 20
MECHVT: 500 mL
MECHVT: 500 mL
O2 Saturation: 97.9 %
O2 Saturation: 85 %
O2 Saturation: 96.7 %
PEEP: 5 cmH2O
PEEP: 5 cmH2O
Patient temperature: 37
Patient temperature: 37
Patient temperature: 37
RATE: 22 resp/min
RATE: 22 resp/min
pCO2 arterial: 105 mmHg (ref 32.0–48.0)
pCO2 arterial: 80 mmHg (ref 32.0–48.0)
pCO2 arterial: 48 mmHg (ref 32.0–48.0)
pH, Arterial: 7.18 — CL (ref 7.350–7.450)
pH, Arterial: 7.29 — ABNORMAL LOW (ref 7.350–7.450)
pH, Arterial: 7.48 — ABNORMAL HIGH (ref 7.350–7.450)
pO2, Arterial: 124 mmHg — ABNORMAL HIGH (ref 83.0–108.0)
pO2, Arterial: 56 mmHg — ABNORMAL LOW (ref 83.0–108.0)
pO2, Arterial: 81 mmHg — ABNORMAL LOW (ref 83.0–108.0)

## 2019-08-05 LAB — LACTATE DEHYDROGENASE: LDH: 279 U/L — ABNORMAL HIGH (ref 98–192)

## 2019-08-05 LAB — BLOOD GAS, VENOUS
Acid-Base Excess: 8.4 mmol/L — ABNORMAL HIGH (ref 0.0–2.0)
Bicarbonate: 40.6 mmol/L — ABNORMAL HIGH (ref 20.0–28.0)
O2 Saturation: 91.9 %
Patient temperature: 37
pCO2, Ven: 104 mmHg (ref 44.0–60.0)
pH, Ven: 7.2 — ABNORMAL LOW (ref 7.250–7.430)
pO2, Ven: 77 mmHg — ABNORMAL HIGH (ref 32.0–45.0)

## 2019-08-05 LAB — CBC
HCT: 42.2 % (ref 39.0–52.0)
Hemoglobin: 12.9 g/dL — ABNORMAL LOW (ref 13.0–17.0)
MCH: 31.6 pg (ref 26.0–34.0)
MCHC: 30.6 g/dL (ref 30.0–36.0)
MCV: 103.4 fL — ABNORMAL HIGH (ref 80.0–100.0)
Platelets: 182 10*3/uL (ref 150–400)
RBC: 4.08 MIL/uL — ABNORMAL LOW (ref 4.22–5.81)
RDW: 13.5 % (ref 11.5–15.5)
WBC: 15 10*3/uL — ABNORMAL HIGH (ref 4.0–10.5)
nRBC: 0 % (ref 0.0–0.2)

## 2019-08-05 LAB — APTT: aPTT: 35 seconds (ref 24–36)

## 2019-08-05 LAB — HEPATIC FUNCTION PANEL
ALT: 42 U/L (ref 0–44)
AST: 56 U/L — ABNORMAL HIGH (ref 15–41)
Albumin: 3.4 g/dL — ABNORMAL LOW (ref 3.5–5.0)
Alkaline Phosphatase: 70 U/L (ref 38–126)
Bilirubin, Direct: 0.4 mg/dL — ABNORMAL HIGH (ref 0.0–0.2)
Indirect Bilirubin: 1.1 mg/dL — ABNORMAL HIGH (ref 0.3–0.9)
Total Bilirubin: 1.5 mg/dL — ABNORMAL HIGH (ref 0.3–1.2)
Total Protein: 6.8 g/dL (ref 6.5–8.1)

## 2019-08-05 LAB — PROCALCITONIN: Procalcitonin: 0.22 ng/mL

## 2019-08-05 LAB — COMPREHENSIVE METABOLIC PANEL
ALT: 38 U/L (ref 0–44)
AST: 43 U/L — ABNORMAL HIGH (ref 15–41)
Albumin: 3.3 g/dL — ABNORMAL LOW (ref 3.5–5.0)
Alkaline Phosphatase: 72 U/L (ref 38–126)
Anion gap: 9 (ref 5–15)
BUN: 21 mg/dL (ref 8–23)
CO2: 31 mmol/L (ref 22–32)
Calcium: 8.8 mg/dL — ABNORMAL LOW (ref 8.9–10.3)
Chloride: 99 mmol/L (ref 98–111)
Creatinine, Ser: 1.26 mg/dL — ABNORMAL HIGH (ref 0.61–1.24)
GFR calc Af Amer: >60 mL/min (ref >60)
GFR calc non Af Amer: 59 mL/min — ABNORMAL LOW (ref >60)
Glucose, Bld: 160 mg/dL — ABNORMAL HIGH (ref 70–99)
Potassium: 5.1 mmol/L (ref 3.5–5.1)
Sodium: 139 mmol/L (ref 135–145)
Total Bilirubin: 1.3 mg/dL — ABNORMAL HIGH (ref 0.3–1.2)
Total Protein: 6.7 g/dL (ref 6.5–8.1)

## 2019-08-05 LAB — BASIC METABOLIC PANEL
Anion gap: 8 (ref 5–15)
BUN: 18 mg/dL (ref 8–23)
CO2: 34 mmol/L — ABNORMAL HIGH (ref 22–32)
Calcium: 8.8 mg/dL — ABNORMAL LOW (ref 8.9–10.3)
Chloride: 99 mmol/L (ref 98–111)
Creatinine, Ser: 1.13 mg/dL (ref 0.61–1.24)
GFR calc Af Amer: >60 mL/min (ref >60)
GFR calc non Af Amer: >60 mL/min (ref >60)
Glucose, Bld: 114 mg/dL — ABNORMAL HIGH (ref 70–99)
Potassium: 3.7 mmol/L (ref 3.5–5.1)
Sodium: 141 mmol/L (ref 135–145)

## 2019-08-05 LAB — MRSA PCR SCREENING: MRSA by PCR: POSITIVE — AB

## 2019-08-05 LAB — LACTIC ACID, PLASMA
Lactic Acid, Venous: 0.6 mmol/L (ref 0.5–1.9)
Lactic Acid, Venous: 0.4 mmol/L — ABNORMAL LOW (ref 0.5–1.9)

## 2019-08-05 LAB — TROPONIN I (HIGH SENSITIVITY)
Troponin I (High Sensitivity): 14 ng/L (ref <18)
Troponin I (High Sensitivity): 13 ng/L (ref <18)
Troponin I (High Sensitivity): 13 ng/L (ref <18)
Troponin I (High Sensitivity): 12 ng/L (ref <18)

## 2019-08-05 LAB — BRAIN NATRIURETIC PEPTIDE
B Natriuretic Peptide: 154 pg/mL — ABNORMAL HIGH (ref 0.0–100.0)
B Natriuretic Peptide: 175 pg/mL — ABNORMAL HIGH (ref 0.0–100.0)

## 2019-08-05 LAB — SARS CORONAVIRUS 2 BY RT PCR (HOSPITAL ORDER, PERFORMED IN ~~LOC~~ HOSPITAL LAB): SARS Coronavirus 2: NEGATIVE

## 2019-08-05 LAB — FIBRIN DERIVATIVES D-DIMER (ARMC ONLY): Fibrin derivatives D-dimer (ARMC): 330.34 ng/mL (FEU) (ref 0.00–499.00)

## 2019-08-05 LAB — STREP PNEUMONIAE URINARY ANTIGEN: Strep Pneumo Urinary Antigen: NEGATIVE

## 2019-08-05 LAB — GLUCOSE, CAPILLARY: Glucose-Capillary: 134 mg/dL — ABNORMAL HIGH (ref 70–99)

## 2019-08-05 LAB — C-REACTIVE PROTEIN: CRP: 17.2 mg/dL — ABNORMAL HIGH (ref <1.0)

## 2019-08-05 LAB — TRIGLYCERIDES: Triglycerides: 35 mg/dL (ref <150)

## 2019-08-05 LAB — FERRITIN: Ferritin: 163 ng/mL (ref 24–336)

## 2019-08-05 MED ORDER — ESCITALOPRAM OXALATE 10 MG PO TABS
20.0000 mg | ORAL_TABLET | Freq: Every day | ORAL | Status: DC
  Filled 2019-08-05: qty 2

## 2019-08-05 MED ORDER — IPRATROPIUM-ALBUTEROL 0.5-2.5 (3) MG/3ML IN SOLN: 3.0000 mL | Freq: Once | RESPIRATORY_TRACT | Status: DC

## 2019-08-05 MED ORDER — APIXABAN 5 MG PO TABS
5.0000 mg | ORAL_TABLET | Freq: Two times a day (BID) | ORAL | Status: DC
  Administered 2019-08-05: 5 mg via ORAL
  Filled 2019-08-05: qty 1

## 2019-08-05 MED ORDER — MUPIROCIN 2 % EX OINT
TOPICAL_OINTMENT | Freq: Two times a day (BID) | CUTANEOUS | Status: DC
  Administered 2019-08-05: 22:00:00 via NASAL
  Administered 2019-08-05: 1 via NASAL
  Administered 2019-08-06: 21:00:00 via NASAL
  Administered 2019-08-06: 1 via NASAL
  Administered 2019-08-07: 21:00:00 via NASAL
  Administered 2019-08-07: 1 via NASAL
  Administered 2019-08-08 – 2019-08-20 (×25): via NASAL
  Filled 2019-08-05: qty 22

## 2019-08-05 MED ORDER — FUROSEMIDE 20 MG PO TABS
40.0000 mg | ORAL_TABLET | Freq: Every day | ORAL | Status: DC
  Administered 2019-08-05: 40 mg via ORAL
  Filled 2019-08-05: qty 2

## 2019-08-05 MED ORDER — MOMETASONE FURO-FORMOTEROL FUM 200-5 MCG/ACT IN AERO
2.0000 | INHALATION_SPRAY | Freq: Two times a day (BID) | RESPIRATORY_TRACT | Status: DC
  Filled 2019-08-05: qty 8.8

## 2019-08-05 MED ORDER — VITAMIN D 25 MCG (1000 UNIT) PO TABS
2000.0000 [IU] | ORAL_TABLET | Freq: Every day | ORAL | Status: DC
  Administered 2019-08-05 – 2019-08-20 (×16): 2000 [IU]
  Filled 2019-08-05 (×17): qty 2

## 2019-08-05 MED ORDER — ETOMIDATE 2 MG/ML IV SOLN
25.0000 mg | Freq: Once | INTRAVENOUS | Status: AC
  Administered 2019-08-05: 25 mg via INTRAVENOUS

## 2019-08-05 MED ORDER — GABAPENTIN 300 MG PO CAPS: 300.0000 mg | ORAL_CAPSULE | Freq: Three times a day (TID) | ORAL | Status: DC

## 2019-08-05 MED ORDER — IPRATROPIUM-ALBUTEROL 0.5-2.5 (3) MG/3ML IN SOLN
3.0000 mL | RESPIRATORY_TRACT | Status: DC
  Administered 2019-08-05 – 2019-08-20 (×89): 3 mL via RESPIRATORY_TRACT
  Filled 2019-08-05 (×90): qty 3

## 2019-08-05 MED ORDER — SODIUM CHLORIDE 0.9 % IV BOLUS
500.0000 mL | Freq: Once | INTRAVENOUS | Status: AC
  Administered 2019-08-05: 500 mL via INTRAVENOUS

## 2019-08-05 MED ORDER — SUCCINYLCHOLINE CHLORIDE 20 MG/ML IJ SOLN
150.0000 mg | Freq: Once | INTRAMUSCULAR | Status: AC
  Administered 2019-08-05: 150 mg via INTRAVENOUS

## 2019-08-05 MED ORDER — IPRATROPIUM-ALBUTEROL 0.5-2.5 (3) MG/3ML IN SOLN
3.0000 mL | Freq: Once | RESPIRATORY_TRACT | Status: AC
  Administered 2019-08-05: 3 mL via RESPIRATORY_TRACT
  Filled 2019-08-05: qty 9

## 2019-08-05 MED ORDER — ALBUTEROL SULFATE HFA 108 (90 BASE) MCG/ACT IN AERS
6.0000 | INHALATION_SPRAY | Freq: Once | RESPIRATORY_TRACT | Status: DC
  Filled 2019-08-05: qty 6.7

## 2019-08-05 MED ORDER — FLUTICASONE PROPIONATE 50 MCG/ACT NA SUSP
2.0000 | Freq: Every day | NASAL | Status: DC | PRN
  Filled 2019-08-05: qty 16

## 2019-08-05 MED ORDER — B COMPLEX-C PO TABS
1.0000 | ORAL_TABLET | Freq: Every day | ORAL | Status: DC
  Administered 2019-08-05: 1 via ORAL
  Filled 2019-08-05: qty 1

## 2019-08-05 MED ORDER — METHYLPREDNISOLONE SODIUM SUCC 40 MG IJ SOLR
40.0000 mg | Freq: Two times a day (BID) | INTRAMUSCULAR | Status: DC
  Administered 2019-08-05: 13:00:00 40 mg via INTRAVENOUS
  Filled 2019-08-05: qty 1

## 2019-08-05 MED ORDER — ACETAMINOPHEN 500 MG PO TABS
1000.0000 mg | ORAL_TABLET | Freq: Once | ORAL | Status: AC
  Administered 2019-08-05: 1000 mg via ORAL
  Filled 2019-08-05: qty 2

## 2019-08-05 MED ORDER — SODIUM CHLORIDE 0.9 % IV SOLN
500.0000 mg | INTRAVENOUS | Status: DC
  Administered 2019-08-06: 500 mg via INTRAVENOUS
  Filled 2019-08-05: qty 500

## 2019-08-05 MED ORDER — CHLORHEXIDINE GLUCONATE CLOTH 2 % EX PADS
6.0000 | MEDICATED_PAD | Freq: Every day | CUTANEOUS | Status: DC
  Administered 2019-08-05 – 2019-08-20 (×16): 6 via TOPICAL

## 2019-08-05 MED ORDER — ACETAMINOPHEN 325 MG PO TABS
650.0000 mg | ORAL_TABLET | Freq: Four times a day (QID) | ORAL | Status: DC | PRN
  Administered 2019-08-16: 650 mg via ORAL
  Filled 2019-08-05: qty 2

## 2019-08-05 MED ORDER — METHYLPREDNISOLONE SODIUM SUCC 40 MG IJ SOLR
40.0000 mg | Freq: Two times a day (BID) | INTRAMUSCULAR | Status: DC
  Administered 2019-08-05 – 2019-08-20 (×30): 40 mg via INTRAVENOUS
  Filled 2019-08-05 (×30): qty 1

## 2019-08-05 MED ORDER — PHENYLEPHRINE HCL-NACL 10-0.9 MG/250ML-% IV SOLN
25.0000 ug/min | INTRAVENOUS | Status: DC
  Filled 2019-08-05: qty 250

## 2019-08-05 MED ORDER — METHYLPREDNISOLONE SODIUM SUCC 125 MG IJ SOLR
125.0000 mg | Freq: Once | INTRAMUSCULAR | Status: AC
  Administered 2019-08-05: 125 mg via INTRAVENOUS
  Filled 2019-08-05: qty 2

## 2019-08-05 MED ORDER — ALBUTEROL SULFATE HFA 108 (90 BASE) MCG/ACT IN AERS: 2.0000 | INHALATION_SPRAY | RESPIRATORY_TRACT | Status: DC

## 2019-08-05 MED ORDER — SODIUM CHLORIDE 0.9 % IV SOLN: 500.0000 mg | INTRAVENOUS | Status: DC

## 2019-08-05 MED ORDER — SODIUM CHLORIDE 0.9 % IV SOLN
25.0000 ug/min | INTRAVENOUS | Status: DC
  Administered 2019-08-05: 30 ug/min via INTRAVENOUS
  Administered 2019-08-05: 25 ug/min via INTRAVENOUS
  Administered 2019-08-06: 40 ug/min via INTRAVENOUS
  Administered 2019-08-06: 38 ug/min via INTRAVENOUS
  Administered 2019-08-06 (×2): 40 ug/min via INTRAVENOUS
  Administered 2019-08-06: 37 ug/min via INTRAVENOUS
  Filled 2019-08-05 (×2): qty 10
  Filled 2019-08-05: qty 1
  Filled 2019-08-05 (×4): qty 10

## 2019-08-05 MED ORDER — ORAL CARE MOUTH RINSE
15.0000 mL | OROMUCOSAL | Status: DC
  Administered 2019-08-05 – 2019-08-20 (×144): 15 mL via OROMUCOSAL

## 2019-08-05 MED ORDER — ESCITALOPRAM OXALATE 10 MG PO TABS
20.0000 mg | ORAL_TABLET | Freq: Every day | ORAL | Status: DC
  Administered 2019-08-05 – 2019-08-19 (×15): 20 mg
  Filled 2019-08-05 (×16): qty 2

## 2019-08-05 MED ORDER — THEOPHYLLINE ER 400 MG PO TB24
400.0000 mg | ORAL_TABLET | Freq: Every day | ORAL | Status: DC
  Administered 2019-08-05 – 2019-08-06 (×2): 400 mg via ORAL
  Filled 2019-08-05 (×3): qty 1

## 2019-08-05 MED ORDER — SENNOSIDES-DOCUSATE SODIUM 8.6-50 MG PO TABS
2.0000 | ORAL_TABLET | Freq: Two times a day (BID) | ORAL | Status: DC
  Administered 2019-08-05 – 2019-08-14 (×19): 2
  Filled 2019-08-05 (×19): qty 2

## 2019-08-05 MED ORDER — CHLORHEXIDINE GLUCONATE 0.12% ORAL RINSE (MEDLINE KIT)
15.0000 mL | Freq: Two times a day (BID) | OROMUCOSAL | Status: DC
  Administered 2019-08-05 – 2019-08-20 (×30): 15 mL via OROMUCOSAL

## 2019-08-05 MED ORDER — ONDANSETRON HCL 4 MG PO TABS: 4.0000 mg | ORAL_TABLET | Freq: Four times a day (QID) | ORAL | Status: DC | PRN

## 2019-08-05 MED ORDER — SODIUM CHLORIDE 0.9 % IV SOLN
1.0000 g | Freq: Once | INTRAVENOUS | Status: AC
  Administered 2019-08-05: 1 g via INTRAVENOUS
  Filled 2019-08-05: qty 10

## 2019-08-05 MED ORDER — PROPOFOL 1000 MG/100ML IV EMUL
5.0000 ug/kg/min | INTRAVENOUS | Status: DC
  Administered 2019-08-05: 15 ug/kg/min via INTRAVENOUS
  Administered 2019-08-05: 10 ug/kg/min via INTRAVENOUS
  Administered 2019-08-06: 20 ug/kg/min via INTRAVENOUS
  Administered 2019-08-06: 15 ug/kg/min via INTRAVENOUS
  Administered 2019-08-06: 14 ug/kg/min via INTRAVENOUS
  Administered 2019-08-07: 25 ug/kg/min via INTRAVENOUS
  Administered 2019-08-07: 30 ug/kg/min via INTRAVENOUS
  Administered 2019-08-07: 20 ug/kg/min via INTRAVENOUS
  Administered 2019-08-07: 30 ug/kg/min via INTRAVENOUS
  Administered 2019-08-08: 25 ug/kg/min via INTRAVENOUS
  Administered 2019-08-08: 30 ug/kg/min via INTRAVENOUS
  Administered 2019-08-08 (×2): 25 ug/kg/min via INTRAVENOUS
  Administered 2019-08-09: 10 ug/kg/min via INTRAVENOUS
  Administered 2019-08-09: 30 ug/kg/min via INTRAVENOUS
  Administered 2019-08-09: 35 ug/kg/min via INTRAVENOUS
  Administered 2019-08-09: 40 ug/kg/min via INTRAVENOUS
  Administered 2019-08-10 – 2019-08-11 (×8): 35 ug/kg/min via INTRAVENOUS
  Filled 2019-08-05 (×25): qty 100

## 2019-08-05 MED ORDER — HEPARIN (PORCINE) 25000 UT/250ML-% IV SOLN
1200.0000 [IU]/h | INTRAVENOUS | Status: DC
  Administered 2019-08-05: 1500 [IU]/h via INTRAVENOUS
  Administered 2019-08-06: 1150 [IU]/h via INTRAVENOUS
  Administered 2019-08-07 – 2019-08-08 (×3): 1300 [IU]/h via INTRAVENOUS
  Administered 2019-08-09: 1350 [IU]/h via INTRAVENOUS
  Administered 2019-08-10 – 2019-08-13 (×5): 1450 [IU]/h via INTRAVENOUS
  Administered 2019-08-14: 1100 [IU]/h via INTRAVENOUS
  Filled 2019-08-05 (×13): qty 250

## 2019-08-05 MED ORDER — ROPINIROLE HCL 0.25 MG PO TABS
0.2500 mg | ORAL_TABLET | Freq: Two times a day (BID) | ORAL | Status: DC
  Filled 2019-08-05 (×2): qty 1

## 2019-08-05 MED ORDER — LORAZEPAM 2 MG/ML IJ SOLN
2.0000 mg | INTRAMUSCULAR | Status: DC | PRN
  Administered 2019-08-06 – 2019-08-15 (×7): 2 mg via INTRAVENOUS
  Filled 2019-08-05 (×7): qty 1

## 2019-08-05 MED ORDER — ALPRAZOLAM 0.5 MG PO TABS: 1.0000 mg | ORAL_TABLET | Freq: Three times a day (TID) | ORAL | Status: DC | PRN

## 2019-08-05 MED ORDER — ONDANSETRON HCL 4 MG/2ML IJ SOLN: 4.0000 mg | Freq: Four times a day (QID) | INTRAMUSCULAR | Status: DC | PRN

## 2019-08-05 MED ORDER — FENTANYL 2500MCG IN NS 250ML (10MCG/ML) PREMIX INFUSION
INTRAVENOUS | Status: AC
  Filled 2019-08-05: qty 250

## 2019-08-05 MED ORDER — FENTANYL 2500MCG IN NS 250ML (10MCG/ML) PREMIX INFUSION
0.0000 ug/h | INTRAVENOUS | Status: DC
  Administered 2019-08-05: 25 ug/h via INTRAVENOUS
  Administered 2019-08-06 (×2): 400 ug/h via INTRAVENOUS
  Administered 2019-08-06: 150 ug/h via INTRAVENOUS
  Administered 2019-08-07 (×2): 300 ug/h via INTRAVENOUS
  Administered 2019-08-07: 400 ug/h via INTRAVENOUS
  Administered 2019-08-08 – 2019-08-09 (×4): 300 ug/h via INTRAVENOUS
  Administered 2019-08-09: 200 ug/h via INTRAVENOUS
  Administered 2019-08-09: 100 ug/h via INTRAVENOUS
  Administered 2019-08-10 (×2): 200 ug/h via INTRAVENOUS
  Administered 2019-08-11: 250 ug/h via INTRAVENOUS
  Administered 2019-08-11: 200 ug/h via INTRAVENOUS
  Administered 2019-08-12 – 2019-08-14 (×6): 250 ug/h via INTRAVENOUS
  Administered 2019-08-14 – 2019-08-16 (×4): 300 ug/h via INTRAVENOUS
  Administered 2019-08-16 (×2): 200 ug/h via INTRAVENOUS
  Administered 2019-08-17: 300 ug/h via INTRAVENOUS
  Administered 2019-08-17 – 2019-08-18 (×4): 350 ug/h via INTRAVENOUS
  Filled 2019-08-05 (×33): qty 250

## 2019-08-05 MED ORDER — SODIUM CHLORIDE 0.9 % IV SOLN
2.0000 g | INTRAVENOUS | Status: AC
  Administered 2019-08-05 – 2019-08-09 (×5): 2 g via INTRAVENOUS
  Filled 2019-08-05 (×4): qty 2
  Filled 2019-08-05: qty 20

## 2019-08-05 MED ORDER — POTASSIUM CHLORIDE CRYS ER 20 MEQ PO TBCR: 20.0000 meq | EXTENDED_RELEASE_TABLET | Freq: Every day | ORAL | Status: DC

## 2019-08-05 MED ORDER — MAGNESIUM OXIDE 400 (241.3 MG) MG PO TABS: 400.0000 mg | ORAL_TABLET | Freq: Every day | ORAL | Status: DC

## 2019-08-05 MED ORDER — FAMOTIDINE 20 MG PO TABS
20.0000 mg | ORAL_TABLET | Freq: Two times a day (BID) | ORAL | Status: DC
  Administered 2019-08-05 – 2019-08-20 (×30): 20 mg
  Filled 2019-08-05 (×30): qty 1

## 2019-08-05 MED ORDER — ACETAMINOPHEN 650 MG RE SUPP: 650.0000 mg | Freq: Four times a day (QID) | RECTAL | Status: DC | PRN

## 2019-08-05 MED ORDER — TIOTROPIUM BROMIDE MONOHYDRATE 2.5 MCG/ACT IN AERS: 2.0000 | INHALATION_SPRAY | Freq: Every day | RESPIRATORY_TRACT | Status: DC

## 2019-08-05 MED ORDER — SODIUM CHLORIDE 0.9 % IV SOLN
500.0000 mg | Freq: Once | INTRAVENOUS | Status: AC
  Administered 2019-08-05: 500 mg via INTRAVENOUS
  Filled 2019-08-05: qty 500

## 2019-08-05 MED ORDER — ALBUTEROL SULFATE (2.5 MG/3ML) 0.083% IN NEBU
5.0000 mg | INHALATION_SOLUTION | Freq: Once | RESPIRATORY_TRACT | Status: AC
  Administered 2019-08-05: 5 mg via RESPIRATORY_TRACT

## 2019-08-05 MED ORDER — BUDESONIDE 0.5 MG/2ML IN SUSP
0.5000 mg | Freq: Two times a day (BID) | RESPIRATORY_TRACT | Status: DC
  Administered 2019-08-05 – 2019-08-20 (×30): 0.5 mg via RESPIRATORY_TRACT
  Filled 2019-08-05 (×33): qty 2

## 2019-08-05 MED ORDER — PROPOFOL 1000 MG/100ML IV EMUL
INTRAVENOUS | Status: AC
  Filled 2019-08-05: qty 100

## 2019-08-05 MED ORDER — TRAZODONE HCL 50 MG PO TABS: 50.0000 mg | ORAL_TABLET | Freq: Every evening | ORAL | Status: DC | PRN

## 2019-08-05 MED ORDER — ACETYLCYSTEINE 600 MG PO CAPS: 600.0000 mg | ORAL_CAPSULE | Freq: Every day | ORAL | Status: DC

## 2019-08-05 MED ORDER — SODIUM CHLORIDE 0.9 % IV SOLN
250.0000 mL | INTRAVENOUS | Status: DC
  Administered 2019-08-18: 250 mL via INTRAVENOUS

## 2019-08-05 MED ORDER — SODIUM CHLORIDE 0.9 % IV SOLN
100.0000 mg | Freq: Two times a day (BID) | INTRAVENOUS | Status: DC
  Filled 2019-08-05: qty 100
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
## 2019-08-05 NOTE — ED Notes (Signed)
Upon reassessment, patient found to be slouched over to right, unresponsive to verbal and physical stimuli.  EDP notified, RT notified; room prepared to intubate.

## 2019-08-05 NOTE — ED Notes (Signed)
Pts family updated on plan of care and treatment.

## 2019-08-05 NOTE — ED Notes (Signed)
RT at bedside, adjusting BiPAP settings.

## 2019-08-05 NOTE — ED Notes (Signed)
Pt intubated per EDP, [place], 7.5, 26 at the lip.  Positive color change, diminished lung sounds.  Radiology called for post intubation chest xray.

## 2019-08-05 NOTE — H&P (Signed)
History and Physical    Dustin [name] QQP:[id number] DOB: May 30, 1956 DOA: 08/14/19  PCP: McLean-Scocuzza, Nino Glow, MD  Patient coming from: Home.  Chief Complaint: Shortness of breath.  HPI: Dustin [name] is a 63 y.o. male with history of COPD with chronic respiratory failure, on chronic diastolic CHF, history of DVT on apixaban presents to the ER with complaints of having fever chills and fatigue.  Patient symptoms started last evening.  Has chronic cough.  Shortness of breath is not more than his baseline.  Patient's daughter went to check on him and found he was febrile and was brought to the ER.  Did not have any chest pain nausea vomiting or diarrhea.  ED Course: In the ER patient was febrile with temperature of 100.1 tachycardic and a VBG was showing gas of 7.2 PCO2 104 PO2 77.  Patient was placed on BiPAP.  Chest x-ray shows infiltrates concerning for pneumonia.  Labs show creatinine 1.1 BNP 154 high-sensitivity troponin of 14 and 13 procalcitonin of 0.2 lactic acid 0.6 BBC count of 15 hemoglobin 12.9 platelets 182 EKG sinus tachycardia COVID-19 test is pending.  Patient was started on empiric antibiotics for community acquired pneumonia along with steroids and inhalers.  Admitted for possible pneumonia with respiratory failure and COPD exacerbation.  Review of Systems: As per HPI, rest all negative.   Past Medical History:  Diagnosis Date  . Allergy   . Anxiety   . Asthma   . Chronic diastolic CHF (congestive heart failure) ([place])    a. echo 07/2013: EF 60-65%, DD, biatrial dilatation, Ao sclerosis, dilated RV, moderate pulmonary HTN, elevated CV and RA pressures; b. patient reported echo at Dr. Laurelyn Sickle office 02/2015 - his office does not have record of him being a pt there c. echo 11/2015: EF 60-65%, Grade 1 DD, mod-severe pulm pressures  . Chronic respiratory failure (HCC)    a. on 2L via nasal cannula; b. secondary to COPD  . COPD (chronic obstructive pulmonary disease)  (HCC)    on 2L continuous   . Depression   . Emphysema of lung ([place])   . GERD (gastroesophageal reflux disease)   . HCAP (healthcare-associated pneumonia)    01/22/18-01/28/18   . Headache   . Hypertension   . Leg DVT (deep venous thromboembolism), acute, bilateral ([place])    02/26/18  . Personal history of tobacco use, presenting hazards to health 08/17/2015  . Pulmonary embolism ([place])    02/26/18  . Pulmonary HTN ([place])   . Tobacco abuse     Past Surgical History:  Procedure Laterality Date  . ABDOMINAL SURGERY    . ADENOIDECTOMY    . CARPAL TUNNEL RELEASE Right   . corpal tunnel Right   . ELBOW SURGERY Right    repaired tendon  . hemmorhoid N/A   . HEMORRHOID SURGERY    . NASAL SINUS SURGERY    . NASAL SINUS SURGERY     1970s  . NOSE SURGERY    . reflux surgery     1994  . ROTATOR CUFF REPAIR Right   . SHOULDER SURGERY    . TENNIS ELBOW RELEASE/NIRSCHEL PROCEDURE Right   . TONSILLECTOMY    . URETHRA SURGERY     surgery 6 times from age [age] yrs old  . URETHRA SURGERY       reports that he has been smoking cigarettes. He has a 4.00 pack-year smoking history. He has never used smokeless tobacco. He reports that he does not drink  alcohol or use drugs.  Allergies  Allergen Reactions  . Asa [Aspirin] Other (See Comments)    Reaction: swelling of the right side.  . Bee Venom Swelling  . Codeine Other (See Comments)    Reaction: Difficulty breathing  . Ibuprofen Other (See Comments)    Reaction: Swelling of the right side.  . Iodinated Diagnostic Agents Other (See Comments)    Reaction:  Unknown  Other reaction(s): Unknown    Family History  Problem Relation Age of Onset  . CAD Father   . Hyperlipidemia Father   . Stroke Father   . Heart disease Father   . Arthritis Father   . Hearing loss Father   . Hypertension Father   . Hypertension Mother   . Peripheral Artery Disease Mother   . Rheum arthritis Mother   . Asthma Mother   . Bipolar disorder Mother   .  Depression Mother   . Malignant hypertension Mother   . Arthritis Mother   . Hearing loss Mother   . Heart disease Mother   . Hyperlipidemia Mother   . Kidney disease Mother   . Birth defects Brother   . Heart disease Brother   . Alcohol abuse Daughter   . Arthritis Daughter   . Asthma Daughter   . Depression Daughter   . Drug abuse Daughter   . Miscarriages / [place] Daughter   . Intellectual disability Daughter   . Kidney disease Son   . Birth defects Paternal Grandmother   . Depression Paternal Grandmother   . Heart disease Paternal Grandmother   . Birth defects Sister   . Diabetes Neg Hx     Prior to Admission medications   Medication Sig Start Date End Date Taking? Authorizing Provider  Acetylcysteine (NAC) 600 MG CAPS Take 1 tablet by mouth daily.     [provider]  ALPRAZolam Prudy Feeler) 1 MG tablet Take 1 tablet (1 mg total) by mouth 3 (three) times daily as needed for anxiety. 04/17/19   McLean-Scocuzza, Pasty Spillers, MD  AMBULATORY NON FORMULARY MEDICATION 1 each by Does not apply route as needed. Please provide patient with "So Clean" CPAP cleaning device to be used as needed. Patient not taking: Reported on 07/28/2019 12/04/18   Merwyn Katos, MD  antiseptic oral rinse (BIOTENE) LIQD 15 mLs by Mouth Rinse route daily as needed for dry mouth. 12/06/18   McLean-Scocuzza, Pasty Spillers, MD  apixaban (ELIQUIS) 5 MG TABS tablet Take 1 tablet (5 mg total) by mouth 2 (two) times daily. 02/18/19   McLean-Scocuzza, Pasty Spillers, MD  azithromycin (ZITHROMAX) 250 MG tablet Take 250 mg by mouth daily. Take one tablet by mouth every other day- patient reports that this was prescribed by pulmonary provider in October 2020    Salena Saner, MD  b complex vitamins capsule Take 1 capsule by mouth daily.    [provider]  carbamide peroxide (DEBROX) 6.5 % OTIC solution Place 5 drops into both ears 2 (two) times daily. Prn x 4-7 days 12/06/18   McLean-Scocuzza, Pasty Spillers, MD   Cholecalciferol (VITAMIN D) 2000 units tablet Take 2,000 Units by mouth 2 (two) times a day.     [provider]  clobetasol (TEMOVATE) 0.05 % external solution Apply 1 application topically 2 (two) times daily. Prn to scalp and ears 08/07/18   McLean-Scocuzza, Pasty Spillers, MD  diltiazem (CARDIZEM) 30 MG tablet Take 1 tablet (30 mg total) by mouth 3 (three) times daily as needed. Patient taking differently:  Take 30 mg by mouth 3 (three) times daily as needed (heartrate).  11/17/18   McLean-Scocuzza, Pasty Spillers, MD  doxycycline (VIBRA-TABS) 100 MG tablet Take 1 tablet (100 mg total) by mouth daily. For 1 more day. Patient not taking: Reported on 07/28/2019 07/11/19   MayoAllyn Kenner, MD  escitalopram (LEXAPRO) 20 MG tablet Take 1 tablet (20 mg total) by mouth at bedtime. 02/25/19   McLean-Scocuzza, Pasty Spillers, MD  fluticasone (FLONASE) 50 MCG/ACT nasal spray Place 2 sprays into both nostrils daily as needed for allergies or rhinitis. 12/06/18   McLean-Scocuzza, Pasty Spillers, MD  furosemide (LASIX) 40 MG tablet Take 1 tablet (40 mg) by mouth twice daily 07/02/19   Antonieta Iba, MD  gabapentin (NEURONTIN) 300 MG capsule TAKE 1 TO 2 CAPSULES BY MOUTH THREE TIMES DAILY Patient taking differently: Take 300-600 mg by mouth 3 (three) times daily.  04/11/19   McLean-Scocuzza, Pasty Spillers, MD  hydrocortisone 2.5 % cream Apply topically 2 (two) times daily as needed. face Patient taking differently: Apply 1 application topically 2 (two) times daily as needed (skin irritations). (apply to the face) 05/29/18   McLean-Scocuzza, Pasty Spillers, MD  ipratropium-albuterol (DUONEB) 0.5-2.5 (3) MG/3ML SOLN Take 3 mLs by nebulization every 6 (six) hours as needed. Patient taking differently: Take 3 mLs by nebulization every 6 (six) hours as needed (shortness of breath/wheezing).  04/10/19   McLean-Scocuzza, Pasty Spillers, MD  ketoconazole (NIZORAL) 2 % shampoo Apply 1 application topically 2 (two) times a week. Let stand for 5 minutes 05/30/18    McLean-Scocuzza, Pasty Spillers, MD  magnesium oxide (MAG-OX) 400 MG tablet Take 400 mg by mouth daily.    [provider]  mometasone-formoterol (DULERA) 200-5 MCG/ACT AERO Inhale 2 puffs into the lungs 2 (two) times daily. 05/21/18   Merwyn Katos, MD  nystatin (MYCOSTATIN) 100000 UNIT/ML suspension Use as directed 5 mLs (500,000 Units total) in the mouth or throat 4 (four) times daily. Swish and spit 3 or 4 times daily for at least 5 days.  Then may be used as needed thereafter for thrush 11/15/18   Merwyn Katos, MD  potassium chloride (KLOR-CON) 10 MEQ tablet Take 4 tablets (40 meq) by mouth once daily Patient taking differently: Take 20 mEq by mouth daily. 20 every other day, alternate with 40 every other day 07/02/19   Antonieta Iba, MD  Probiotic Product (PROBIOTIC PO) Take 1 capsule by mouth 2 (two) times a day.     [provider]  rOPINIRole (REQUIP) 0.25 MG tablet Take 1 tablet (0.25 mg total) by mouth 2 (two) times daily. 02/25/19   McLean-Scocuzza, Pasty Spillers, MD  sodium chloride (OCEAN) 0.65 % SOLN nasal spray Place 1-2 sprays into both nostrils as needed for congestion. 03/12/19   McLean-Scocuzza, Pasty Spillers, MD  Spacer/Aero-Holding Chambers [facility] ADVANTAGE-LG MASK) MISC Use as directed with inhaler diag  j44.1 09/28/17   Yevonne Pax, MD  theophylline (UNIPHYL) 400 MG 24 hr tablet Take 1 tablet (400 mg total) by mouth every morning. 03/03/19   Merwyn Katos, MD  Tiotropium Bromide Monohydrate (SPIRIVA RESPIMAT) 2.5 MCG/ACT AERS Inhale 2 puffs into the lungs daily. 01/08/19   Merwyn Katos, MD  traZODone (DESYREL) 50 MG tablet Take 1-2 tablets (50-100 mg total) by mouth at bedtime as needed for sleep. 01/14/19   McLean-Scocuzza, Pasty Spillers, MD    Physical Exam: Constitutional: Moderately built and nourished. Vitals:   07/28/2019 0430 08/06/2019 0457 08/13/2019 0530 07/31/2019 0600  BP: 134/82  127/77 124/78  Pulse: (!) 107 (!) 107 (!) 108 (!) 106  Resp: 20 (!) 22 (!) 22  19  Temp:      TempSrc:      SpO2: 95% 98% 96% 95%   Eyes: Anicteric no pallor. ENMT: No discharge from the ears eyes nose or mouth. Neck: No mass felt.  No neck rigidity. Respiratory: Bilateral expiratory wheeze and no crepitations. Cardiovascular: S1-S2 heard. Abdomen: Soft nontender bowel sounds present. Musculoskeletal: No edema. Skin: No rash. Neurologic: Patient is mildly drowsy but easily arousable and answers questions appropriately and moves all extremities. Psychiatric: Mildly drowsy but answers questions appropriately.   Labs on Admission: I have personally reviewed following labs and imaging studies  CBC: Recent Labs  Lab 07/31/2019 0021  WBC 15.0*  HGB 12.9*  HCT 42.2  MCV 103.4*  PLT 182   Basic Metabolic Panel: Recent Labs  Lab 07/25/2019 0021  NA 141  K 3.7  CL 99  CO2 34*  GLUCOSE 114*  BUN 18  CREATININE 1.13  CALCIUM 8.8*   GFR: Estimated Creatinine Clearance: 78 mL/min (by C-G formula based on SCr of 1.13 mg/dL). Liver Function Tests: No results for input(s): AST, ALT, ALKPHOS, BILITOT, PROT, ALBUMIN in the last 168 hours. No results for input(s): LIPASE, AMYLASE in the last 168 hours. No results for input(s): AMMONIA in the last 168 hours. Coagulation Profile: No results for input(s): INR, PROTIME in the last 168 hours. Cardiac Enzymes: No results for input(s): CKTOTAL, CKMB, CKMBINDEX, TROPONINI in the last 168 hours. BNP (last 3 results) No results for input(s): PROBNP in the last 8760 hours. HbA1C: No results for input(s): HGBA1C in the last 72 hours. CBG: No results for input(s): GLUCAP in the last 168 hours. Lipid Profile: No results for input(s): CHOL, HDL, LDLCALC, TRIG, CHOLHDL, LDLDIRECT in the last 72 hours. Thyroid Function Tests: No results for input(s): TSH, T4TOTAL, FREET4, T3FREE, THYROIDAB in the last 72 hours. Anemia Panel: No results for input(s): VITAMINB12, FOLATE, FERRITIN, TIBC, IRON, RETICCTPCT in the last 72  hours. Urine analysis:    Component Value Date/Time   COLORURINE YELLOW (A) 07/08/2019 1504   APPEARANCEUR HAZY (A) 07/08/2019 1504   APPEARANCEUR Hazy 07/23/2014 2110   LABSPEC 1.026 07/08/2019 1504   LABSPEC 1.027 07/23/2014 2110   PHURINE 5.0 07/08/2019 1504   GLUCOSEU NEGATIVE 07/08/2019 1504   GLUCOSEU Negative 07/23/2014 2110   HGBUR NEGATIVE 07/08/2019 1504   BILIRUBINUR NEGATIVE 07/08/2019 1504   BILIRUBINUR Negative 07/23/2014 2110   KETONESUR NEGATIVE 07/08/2019 1504   PROTEINUR NEGATIVE 07/08/2019 1504   NITRITE NEGATIVE 07/08/2019 1504   LEUKOCYTESUR NEGATIVE 07/08/2019 1504   LEUKOCYTESUR Negative 07/23/2014 2110   Sepsis Labs: @LABRCNTIP (procalcitonin:4,lacticidven:4) ) Recent Results (from the past 240 hour(s))  Culture, blood (routine x 2)     Status: None (Preliminary result)   Collection Time: 08/03/2019  4:35 AM   Specimen: BLOOD  Result Value Ref Range Status   Specimen Description BLOOD RIGHT WRIST   Final   Special Requests   Final    BOTTLES DRAWN AEROBIC AND ANAEROBIC Blood Culture results may not be optimal due to an excessive volume of blood received in culture bottles   Culture   Final    NO GROWTH <12 HOURS Performed at [facility], [address] [place], [place]    Report Status PENDING  Incomplete  Culture, blood (routine x 2)     Status: None (Preliminary result)   Collection Time:  08/12/2019  4:35 AM   Specimen: BLOOD  Result Value Ref Range Status   Specimen Description BLOOD LEFT HAND  Final   Special Requests   Final    BOTTLES DRAWN AEROBIC AND ANAEROBIC Blood Culture results may not be optimal due to an excessive volume of blood received in culture bottles   Culture   Final    NO GROWTH <12 HOURS Performed at [facility], [address] [place], [name] [address]    Report Status PENDING  Incomplete     Radiological Exams on Admission: Dg Chest 2 View  Result Date: 08/12/2019 CLINICAL DATA:   Fever, shortness of breath EXAM: CHEST - 2 VIEW COMPARISON:  07/09/2019 FINDINGS: Heart is upper limits normal in size. Bibasilar opacities are again noted, similar to prior study. No visible effusions. No acute bony abnormality. IMPRESSION: Bibasilar opacities, similar to prior study. This could reflect atelectasis or infiltrates/pneumonia. Electronically Signed   By: Charlett NoseKevin  Dover M.D.   On: 07/23/2019 00:47    EKG: Independently reviewed.  Sinus tachycardia with right bundle branch block.  Assessment/Plan Principal Problem:   Acute on chronic respiratory failure with hypoxia (HCC) Active Problems:   Chronic diastolic heart failure (HCC)   Leg DVT (deep venous thromboembolism), acute, bilateral (HCC)   COPD exacerbation (HCC)   Acute respiratory failure with hypoxia (HCC)    1. Acute on chronic respiratory failure with hypoxia and hypercarbia with fever concerning for pneumonia particularly Covid 19 for which patient is placed presently on empiric antibiotics for community-acquired pneumonia COVID-19 test is pending patient is also on steroids for COPD.  We will keep patient on inhalers.  Check ABG which has been ordered now. 2. Chronic diastolic CHF last EF measured recently on November 6 was 55 to 50% with grade 1 diastolic dysfunction on Lasix 40 mg daily. 3. History of DVT on Eliquis which will be continued. 4. Chronic anemia follow CBC hemoglobin appears to be at baseline. 5. Restless leg syndrome on ropinirole. 6. History of depression on Lexapro. 7. Patient is on trazodone and gabapentin.  If patient's ABG shows worsening CO2 we have to hold these medications.  Repeat ABG is pending. COVID-19 test is pending.  Please update patient's healthcare power of attorney Ms. Sharyon CableMcKeithen, Caroline at [phone] for any changes.   DVT prophylaxis: Apixaban. Code Status: Full code confirmed with patient's healthcare power of attorney Ms. Sharyon CableMcKeithen, [place] at [phone] Family  Communication:  Ms. Sharyon CableMcKeithen, Caroline at [phone] Disposition Plan: Home when stable. Consults called: None. Admission status: Inpatient.   Eduard ClosArshad N Dereke Neumann MD [facility] Pager [phone].  If 7PM-7AM, please contact night-coverage [email or web address] Password [email or web address]  08/04/2019, 6:27 AM

## 2019-08-05 NOTE — ED Triage Notes (Signed)
Pt c/o generalized malaise, fever and increased SOB from chronic x2 days. Pt has hx/o COPD/Asthma and on 2L O2. Tonight pt has increased O2 to 3.5L and O2 in triage is 95%.

## 2019-08-05 NOTE — Progress Notes (Signed)
Pharmacy Electrolyte Monitoring Consult:  Pharmacy consulted to assist in monitoring and replacing electrolytes in this 63 y.o. male admitted on August 22, 2019 with acute respiratory failure from COPD, CHF, and possible PNA.   Labs:  Sodium (mmol/[name])  Date Value  22-Aug-2019 139  04/01/2019 145 (H)  07/23/2014 139   Potassium (mmol/[name])  Date Value  2019-08-22 5.1  07/23/2014 4.1   Magnesium (mg/dL)  Date Value  12/06/2018 2.2   Phosphorus (mg/dL)  Date Value  11/27/2017 3.9   Calcium (mg/dL)  Date Value  22-Aug-2019 8.8 ([name])   Calcium, Total (mg/dL)  Date Value  07/23/2014 8.4 ([name])   Albumin (g/dL)  Date Value  08-22-19 3.3 ([name])  07/23/2014 2.8 ([name])    Assessment/Plan: Electrolytes: Patient's furosemide stopped on admission secondary to vasopressor requirements. No replacement warranted at this time.   Glucose: patient with no history of diabetes (A1C 5.6 on 10/22). Patient ordered methylprednisolone 40mg  BID. Will continue to monitor.   Constipation: patient's sedation regimen includes continuous fentanyl infusion. Will initiate senna/docusate 2 tabs VT BID.   Pharmacy will continue to monitor and adjust per consult.   Dustin [name],Dustin [name] 08/22/19 2:57 PM

## 2019-08-05 NOTE — ED Notes (Signed)
RT to bedside, collecting ABG.

## 2019-08-05 NOTE — Progress Notes (Signed)
PROGRESS NOTE    Dustin [name]  QMV:[id number] DOB: [birth year] DOA: August 06, 2019 PCP: McLean-Scocuzza, Pasty Spillers, MD   Brief Narrative: 63 year old male with history of COPD with chronic respiratory failure, chronic diastolic heart failure, history of DVT on apixaban who presented to the ER complaining of fever chills and fatigue.  In the ER patient was febrile with a temperature 100.1 and ABG PCO2 104 pH 7.2 with a PO2 of 77.  He was placed on BiPAP to 1/2 hours later gas noted to be pH of 7.18 with a PCO2 of 105 was not responsive and not protecting his airway.  He was subsequently intubated.  Rapid Covid test was obtained which was negative.   Assessment & Plan:   Principal Problem:   Acute on chronic respiratory failure with hypoxia (HCC) Active Problems:   Chronic diastolic heart failure (HCC)   Leg DVT (deep venous thromboembolism), acute, bilateral (HCC)   COPD exacerbation (HCC)   Acute respiratory failure with hypoxia (HCC)   Acute on chronic respiratory failure with hypoxia and hypercarbia Patient was intubated to protect his airway.  Blood gas improved Continue with vent protocol Sedation Oral hygiene  Severe sepsis with community acquired pneumonia Ventilator as above Patient transitioning to ICU was primary team Antibiotics per primary  Chronic diastolic heart failure with most recent EF of 55% Patient maintaining blood pressure Strict I's and O's Daily weights  Chronic DVT Patient was taken Eliquis prior intubation Need appropriate anticoagulation in ICU Defer to the primary team    DVT prophylaxis: SCD/Compression stockings  Code Status: full code    Code Status Orders  (From admission, onward)         Start     Ordered   06-Aug-2019 0626  Full code  Continuous     08-06-19 0626        Code Status History    Date Active Date Inactive Code Status Order ID Comments User Context   07/07/2019 1810 07/10/2019 1813 Full Code [id number]  Ihor Austin,  MD Inpatient   01/23/2018 0141 01/28/2018 2201 Full Code [id number]  Cammy Copa, MD Inpatient   01/15/2018 1038 01/21/2018 2000 Full Code [id number]  Enid Baas, MD Inpatient   11/22/2017 1747 11/29/2017 1654 Full Code [id number]  Kathlene Cote, PA-C Inpatient   09/19/2017 0459 09/24/2017 1936 Full Code [id number]  Arnaldo Natal, MD ED   08/06/2017 1822 08/14/2017 1304 Full Code [id number]  Alford Highland, MD ED   06/28/2017 2304 07/03/2017 2113 Full Code [id number]  Auburn Bilberry, MD ED   01/22/2017 2129 01/29/2017 1944 Full Code [id number]  Houston Siren, MD Inpatient   06/23/2016 0001 06/28/2016 1943 Full Code [id number]  Oralia Manis, MD Inpatient   03/25/2016 1703 03/31/2016 2308 Full Code [id number]  Standley Brooking, MD Inpatient   11/30/2015 0512 12/06/2015 2134 Full Code [id number]  Arnaldo Natal, MD Inpatient   04/13/2015 1722 04/21/2015 1940 Full Code [id number]  Auburn Bilberry, MD Inpatient   03/31/2015 2050 04/05/2015 2048 Full Code [id number]  Hower, Cletis Athens, MD ED   Advance Care Planning Activity     Family Communication: Discussed with sister healthcare power of attorney Disposition Plan:   Patient admitted to critical care ventilated. Consults called: None Admission status: Inpatient   Consultants:   CCM who is assuming care  Procedures:  Dg Chest 1 View  Result Date: 07/09/2019 CLINICAL DATA:  Shortness of breath EXAM: CHEST  1 VIEW COMPARISON:  07/07/2019 FINDINGS: Heart is borderline in size. Bibasilar  airspace opacities are again noted, unchanged. No effusions. No acute bony abnormality. Mild vascular congestion. IMPRESSION: Bibasilar atelectasis or infiltrates, unchanged since prior study. Electronically Signed   By: Charlett NoseKevin  Dover M.D.   On: 07/09/2019 09:48   Dg Chest 2 View  Result Date: 2019-01-04 CLINICAL DATA:  Fever, shortness of breath EXAM: CHEST - 2 VIEW COMPARISON:  07/09/2019 FINDINGS: Heart is upper limits normal in size. Bibasilar opacities are again  noted, similar to prior study. No visible effusions. No acute bony abnormality. IMPRESSION: Bibasilar opacities, similar to prior study. This could reflect atelectasis or infiltrates/pneumonia. Electronically Signed   By: Charlett NoseKevin  Dover M.D.   On: 2019-01-04 00:47   Dg Chest 2 View  Result Date: 07/07/2019 CLINICAL DATA:  Shortness of breath EXAM: CHEST - 2 VIEW COMPARISON:  April 25, 2019 FINDINGS: There is underlying emphysematous change with bullous disease in the upper lobes. There is patchy atelectasis in the lung bases. There is no frank edema or consolidation. Heart size is upper normal. The pulmonary vascularity reflects underlying emphysematous change with diminished vascularity in the upper lobes. No adenopathy evident. No bone lesions. IMPRESSION: Emphysematous change with bibasilar atelectasis. No frank edema or consolidation. Heart upper normal in size. Pulmonary vascularity reflects underlying emphysematous change. No adenopathy evident. Emphysema (ICD10-J43.9). Electronically Signed   By: Bretta BangWilliam  Woodruff III M.D.   On: 07/07/2019 11:41   Dg Abdomen 1 View  Result Date: 2019-01-04 CLINICAL DATA:  OG tube placement. EXAM: ABDOMEN - 1 VIEW COMPARISON:  11/30/2015 FINDINGS: The OG tube tip is in the fundus of the stomach and could be advanced. The visualized bowel gas pattern is normal. Markings are slightly accentuated at the lung bases. IMPRESSION: 1. OG tube tip is in the fundus of the stomach and could be advanced slightly. 2. No acute abnormalities in the abdomen. Electronically Signed   By: Francene BoyersJames  Maxwell M.D.   On: 2019-01-04 11:42   Dg Chest Portable 1 View  Result Date: 2019-01-04 CLINICAL DATA:  Hypoxia EXAM: PORTABLE CHEST 1 VIEW COMPARISON:  August 05, 2019 study obtained earlier in the day FINDINGS: Endotracheal tube tip is 4.7 cm above the carina. No pneumothorax. There is patchy airspace opacity in both lung bases with associated atelectasis and pleural effusions. Pleural  effusions are larger than on study obtained earlier in the day. There appears to be bullous disease in the upper lobes, particularly on the right. Heart size is normal. There is diminished pulmonary vascularity in the upper lobes, likely due to bullous disease. Pulmonary vascularity appears stable. No adenopathy. No bone lesions evident. IMPRESSION: Endotracheal tube as described without pneumothorax. There are new small pleural effusions bilaterally. There is patchy airspace consolidation and atelectasis in the lung bases. Probable bullous disease in the upper lobes. Stable cardiac silhouette. Electronically Signed   By: Bretta BangWilliam  Woodruff III M.D.   On: 2019-01-04 10:03     Antimicrobials:   Received azithromycin and ceftriaxone in the ER   Subjective: Patient unable to protect airway now intubated  Objective: Vitals:   04/20/19 1000 04/20/19 1030 04/20/19 1100 04/20/19 1125  BP: 111/76 110/72 91/71 100/78  Pulse: 91 76 79 77  Resp: (!) 22 (!) 22 (!) 22 (!) 22  Temp:  (!) 95.9 F (35.5 C) (!) 97.3 F (36.3 C) 97.7 F (36.5 C)  TempSrc:      SpO2: 98% (!) 89% 92% 95%   No intake or output data in the 24 hours ending 04/20/19 1149 There were no vitals filed  for this visit.  Examination:  General exam: Not responsive not protecting airway Respiratory system: Rhonchi throughout, mild wheezing throughout Cardiovascular system: Sinus tachycardia, no murmurs rubs or gallops noted although limited exam secondary to respiratory compromise  gastrointestinal system: Abdomen is nondistended, soft Normal bowel sounds heard. Central nervous system: Obtunded, reflexes 2+ Extremities: No contractures, warm well perfused Skin: No rashes, lesions or ulcers Psychiatry: Judgement and insight impaired.     Data Reviewed: I have personally reviewed following labs and imaging studies  CBC: Recent Labs  Lab Aug 31, 2019 0021  WBC 15.0*  HGB 12.9*  HCT 42.2  MCV 103.4*  PLT 182   Basic  Metabolic Panel: Recent Labs  Lab 08/31/19 0021  NA 141  K 3.7  CL 99  CO2 34*  GLUCOSE 114*  BUN 18  CREATININE 1.13  CALCIUM 8.8*   GFR: Estimated Creatinine Clearance: 78 mL/min (by C-G formula based on SCr of 1.13 mg/dL). Liver Function Tests: Recent Labs  Lab 08/31/2019 0941  AST 56*  ALT 42  ALKPHOS 70  BILITOT 1.5*  PROT 6.8  ALBUMIN 3.4*   No results for input(s): LIPASE, AMYLASE in the last 168 hours. No results for input(s): AMMONIA in the last 168 hours. Coagulation Profile: No results for input(s): INR, PROTIME in the last 168 hours. Cardiac Enzymes: No results for input(s): CKTOTAL, CKMB, CKMBINDEX, TROPONINI in the last 168 hours. BNP (last 3 results) No results for input(s): PROBNP in the last 8760 hours. HbA1C: No results for input(s): HGBA1C in the last 72 hours. CBG: No results for input(s): GLUCAP in the last 168 hours. Lipid Profile: Recent Labs    Aug 31, 2019 0941  TRIG 35   Thyroid Function Tests: No results for input(s): TSH, T4TOTAL, FREET4, T3FREE, THYROIDAB in the last 72 hours. Anemia Panel: Recent Labs    2019-08-31 0941  FERRITIN 163   Sepsis Labs: Recent Labs  Lab 08-31-19 0417 08/31/2019 0635  PROCALCITON 0.22  --   LATICACIDVEN 0.6 0.4*    Recent Results (from the past 240 hour(s))  Culture, blood (routine x 2)     Status: None (Preliminary result)   Collection Time: 08/31/2019  4:35 AM   Specimen: BLOOD  Result Value Ref Range Status   Specimen Description BLOOD RIGHT WRIST   Final   Special Requests   Final    BOTTLES DRAWN AEROBIC AND ANAEROBIC Blood Culture results may not be optimal due to an excessive volume of blood received in culture bottles   Culture   Final    NO GROWTH <12 HOURS Performed at [facility], [address] [place], [place] [address]    Report Status PENDING  Incomplete  Culture, blood (routine x 2)     Status: None (Preliminary result)   Collection Time: Aug 31, 2019  4:35 AM   Specimen:  BLOOD  Result Value Ref Range Status   Specimen Description BLOOD LEFT HAND  Final   Special Requests   Final    BOTTLES DRAWN AEROBIC AND ANAEROBIC Blood Culture results may not be optimal due to an excessive volume of blood received in culture bottles   Culture   Final    NO GROWTH <12 HOURS Performed at [facility], [address] [place], [place] [address]    Report Status PENDING  Incomplete  SARS Coronavirus 2 by RT PCR (hospital order, performed in [facility] hospital lab) Nasopharyngeal Nasopharyngeal Swab     Status: None   Collection Time: 08-31-19  9:41 AM  Specimen: Nasopharyngeal Swab  Result Value Ref Range Status   SARS Coronavirus 2 NEGATIVE NEGATIVE Final    Comment: (NOTE) If result is NEGATIVE SARS-CoV-2 target nucleic acids are NOT DETECTED. The SARS-CoV-2 RNA is generally detectable in upper and lower  respiratory specimens during the acute phase of infection. The lowest  concentration of SARS-CoV-2 viral copies this assay can detect is 250  copies / mL. A negative result does not preclude SARS-CoV-2 infection  and should not be used as the sole basis for treatment or other  patient management decisions.  A negative result may occur with  improper specimen collection / handling, submission of specimen other  than nasopharyngeal swab, presence of viral mutation(s) within the  areas targeted by this assay, and inadequate number of viral copies  (<250 copies / mL). A negative result must be combined with clinical  observations, patient history, and epidemiological information. If result is POSITIVE SARS-CoV-2 target nucleic acids are DETECTED. The SARS-CoV-2 RNA is generally detectable in upper and lower  respiratory specimens dur ing the acute phase of infection.  Positive  results are indicative of active infection with SARS-CoV-2.  Clinical  correlation with patient history and other diagnostic information is  necessary to determine patient  infection status.  Positive results do  not rule out bacterial infection or co-infection with other viruses. If result is PRESUMPTIVE POSTIVE SARS-CoV-2 nucleic acids MAY BE PRESENT.   A presumptive positive result was obtained on the submitted specimen  and confirmed on repeat testing.  While 2019 novel coronavirus  (SARS-CoV-2) nucleic acids may be present in the submitted sample  additional confirmatory testing may be necessary for epidemiological  and / or clinical management purposes  to differentiate between  SARS-CoV-2 and other Sarbecovirus currently known to infect humans.  If clinically indicated additional testing with an alternate test  methodology [phone]) is advised. The SARS-CoV-2 RNA is generally  detectable in upper and lower respiratory sp ecimens during the acute  phase of infection. The expected result is Negative. Fact Sheet for Patients:  StrictlyIdeas.no Fact Sheet for Healthcare Providers: BankingDealers.co.za This test is not yet approved or cleared by the [place] FDA and has been authorized for detection and/or diagnosis of SARS-CoV-2 by FDA under an Emergency Use Authorization (EUA).  This EUA will remain in effect (meaning this test can be used) for the duration of the COVID-19 declaration under Section 564(b)(1) of the Act, 21 U.S.C. section 360bbb-3(b)(1), unless the authorization is terminated or revoked sooner. Performed at [facility], [address], [place], [place] [address]          Radiology Studies: Dg Chest 2 View  Result Date: 08/04/2019 CLINICAL DATA:  Fever, shortness of breath EXAM: CHEST - 2 VIEW COMPARISON:  07/09/2019 FINDINGS: Heart is upper limits normal in size. Bibasilar opacities are again noted, similar to prior study. No visible effusions. No acute bony abnormality. IMPRESSION: Bibasilar opacities, similar to prior study. This could reflect atelectasis or  infiltrates/pneumonia. Electronically Signed   By: Rolm Baptise M.D.   On: 08/13/2019 00:47   Dg Abdomen 1 View  Result Date: 07/22/2019 CLINICAL DATA:  OG tube placement. EXAM: ABDOMEN - 1 VIEW COMPARISON:  11/30/2015 FINDINGS: The OG tube tip is in the fundus of the stomach and could be advanced. The visualized bowel gas pattern is normal. Markings are slightly accentuated at the lung bases. IMPRESSION: 1. OG tube tip is in the fundus of the stomach and could be advanced slightly. 2. No  acute abnormalities in the abdomen. Electronically Signed   By: Francene Boyers M.D.   On: 08/03/2019 11:42   Dg Chest Portable 1 View  Result Date: 07/24/2019 CLINICAL DATA:  Hypoxia EXAM: PORTABLE CHEST 1 VIEW COMPARISON:  August 05, 2019 study obtained earlier in the day FINDINGS: Endotracheal tube tip is 4.7 cm above the carina. No pneumothorax. There is patchy airspace opacity in both lung bases with associated atelectasis and pleural effusions. Pleural effusions are larger than on study obtained earlier in the day. There appears to be bullous disease in the upper lobes, particularly on the right. Heart size is normal. There is diminished pulmonary vascularity in the upper lobes, likely due to bullous disease. Pulmonary vascularity appears stable. No adenopathy. No bone lesions evident. IMPRESSION: Endotracheal tube as described without pneumothorax. There are new small pleural effusions bilaterally. There is patchy airspace consolidation and atelectasis in the lung bases. Probable bullous disease in the upper lobes. Stable cardiac silhouette. Electronically Signed   By: Bretta Bang III M.D.   On: 08/11/2019 10:03        Scheduled Meds: . Acetylcysteine  600 mg Oral Daily  . albuterol  2 puff Inhalation Q4H  . apixaban  5 mg Oral BID  . B-complex with vitamin C  1 tablet Oral Daily  . budesonide (PULMICORT) nebulizer solution  0.5 mg Nebulization BID  . escitalopram  20 mg Oral QHS  . furosemide   40 mg Oral Daily  . ipratropium-albuterol  3 mL Nebulization Q4H  . methylPREDNISolone (SOLU-MEDROL) injection  40 mg Intravenous Q12H  . theophylline  400 mg Oral Daily   Continuous Infusions: . cefTRIAXone (ROCEPHIN)  IV    . doxycycline (VIBRAMYCIN) IV    . fentaNYL infusion INTRAVENOUS 25 mcg/hr (08/14/2019 0950)  . propofol (DIPRIVAN) infusion 10 mcg/kg/min (08/09/2019 0950)     LOS: 0 days    Time spent: 55 min    Burke Keels, MD [facility]  If 7PM-7AM, please contact night-coverage  07/26/2019, 11:49 AM

## 2019-08-05 NOTE — H&P (Signed)
Name: Dustin [name] MRN: [id number] DOB: April 07, 1956     CONSULTATION DATE: 07/22/2019  REFERRING MD :Altha Harm  CHIEF COMPLAINT: resp failure   HISTORY OF PRESENT ILLNESS:   63 y.o. male with history of COPD with chronic respiratory failure, on chronic diastolic CHF, history of DVT on apixaban presents to the ER with complaints of having fever chills and fatigue -Patient's daughter went to check on him and found he was febrile and was brought to the ER.   ED Course: In the ER patient was febrile with temperature of 100.1 tachycardic and a VBG was showing gas of 7.2 PCO2 104 PO2 77 -Patient was placed on BiPAP -Chest x-ray shows infiltrates concerning for pneumonia -Labs show creatinine 1.1 BNP 154 high-sensitivity troponin of 14 and 13 procalcitonin of 0.2 lactic acid 0.6 BBC count of 15 hemoglobin 12.9 platelets 182 EKG sinus tachycardia  COVID-19 test is NEG Patient subsequently developed worsening resp failure Failed biPAP and was emergently intubated  Patient transferred to ICU for further care Critically ill Prognosis is guarded     PAST MEDICAL HISTORY :   has a past medical history of Allergy, Anxiety, Asthma, Chronic diastolic CHF (congestive heart failure) (HCC), Chronic respiratory failure (HCC), COPD (chronic obstructive pulmonary disease) (HCC), Depression, Emphysema of lung (HCC), GERD (gastroesophageal reflux disease), HCAP (healthcare-associated pneumonia), Headache, Hypertension, Leg DVT (deep venous thromboembolism), acute, bilateral (HCC), Personal history of tobacco use, presenting hazards to health (08/17/2015), Pulmonary embolism (HCC), Pulmonary HTN (HCC), and Tobacco abuse.  has a past surgical history that includes Abdominal surgery; Nose surgery; Urethra surgery; Adenoidectomy; Carpal tunnel release (Right); Tennis elbow release/nirschel procedure (Right); Rotator cuff repair (Right); reflux surgery; Nasal sinus surgery; Urethra surgery; Shoulder  surgery; Elbow surgery (Right); corpal tunnel (Right); hemmorhoid (N/A); Nasal sinus surgery; Hemorrhoid surgery; and Tonsillectomy. Prior to Admission medications   Medication Sig Start Date End Date Taking? Authorizing Provider  Acetylcysteine (NAC) 600 MG CAPS Take 1 tablet by mouth daily.    Yes [provider]  ALPRAZolam Prudy Feeler) 1 MG tablet Take 1 tablet (1 mg total) by mouth 3 (three) times daily as needed for anxiety. 04/17/19  Yes McLean-Scocuzza, Pasty Spillers, MD  antiseptic oral rinse (BIOTENE) LIQD 15 mLs by Mouth Rinse route daily as needed for dry mouth. 12/06/18  Yes McLean-Scocuzza, Pasty Spillers, MD  apixaban (ELIQUIS) 5 MG TABS tablet Take 1 tablet (5 mg total) by mouth 2 (two) times daily. 02/18/19  Yes McLean-Scocuzza, Pasty Spillers, MD  azithromycin (ZITHROMAX) 250 MG tablet Take 250 mg by mouth every other day.    Yes Salena Saner, MD  b complex vitamins capsule Take 1 capsule by mouth daily.   Yes [provider]  carbamide peroxide (DEBROX) 6.5 % OTIC solution Place 5 drops into both ears 2 (two) times daily. Prn x 4-7 days 12/06/18  Yes McLean-Scocuzza, Pasty Spillers, MD  Cholecalciferol (VITAMIN D) 2000 units tablet Take 2,000 Units by mouth 2 (two) times a day.    Yes [provider]  clobetasol (TEMOVATE) 0.05 % external solution Apply 1 application topically 2 (two) times daily. Prn to scalp and ears 08/07/18  Yes McLean-Scocuzza, Pasty Spillers, MD  diltiazem (CARDIZEM) 30 MG tablet Take 1 tablet (30 mg total) by mouth 3 (three) times daily as needed. Patient taking differently: Take 30 mg by mouth 3 (three) times daily as needed (heartrate).  11/17/18  Yes McLean-Scocuzza, Pasty Spillers, MD  escitalopram (LEXAPRO) 20 MG tablet Take 1 tablet (20 mg total) by  mouth at bedtime. 02/25/19  Yes McLean-Scocuzza, Nino Glow, MD  fluticasone (FLONASE) 50 MCG/ACT nasal spray Place 2 sprays into both nostrils daily as needed for allergies or rhinitis. 12/06/18  Yes McLean-Scocuzza, Nino Glow, MD   furosemide (LASIX) 40 MG tablet Take 1 tablet (40 mg) by mouth twice daily 07/02/19  Yes Gollan, Kathlene November, MD  gabapentin (NEURONTIN) 300 MG capsule TAKE 1 TO 2 CAPSULES BY MOUTH THREE TIMES DAILY Patient taking differently: Take 300-600 mg by mouth 3 (three) times daily.  04/11/19  Yes McLean-Scocuzza, Nino Glow, MD  hydrocortisone 2.5 % cream Apply topically 2 (two) times daily as needed. face Patient taking differently: Apply 1 application topically 2 (two) times daily as needed (skin irritations). (apply to the face) 05/29/18  Yes McLean-Scocuzza, Nino Glow, MD  ipratropium-albuterol (DUONEB) 0.5-2.5 (3) MG/3ML SOLN Take 3 mLs by nebulization every 6 (six) hours as needed. Patient taking differently: Take 3 mLs by nebulization every 6 (six) hours as needed (shortness of breath/wheezing).  04/10/19  Yes McLean-Scocuzza, Nino Glow, MD  magnesium oxide (MAG-OX) 400 MG tablet Take 400 mg by mouth daily.   Yes [provider]  mometasone-formoterol (DULERA) 200-5 MCG/ACT AERO Inhale 2 puffs into the lungs 2 (two) times daily. 05/21/18  Yes Wilhelmina Mcardle, MD  nystatin (MYCOSTATIN) 100000 UNIT/ML suspension Use as directed 5 mLs (500,000 Units total) in the mouth or throat 4 (four) times daily. Swish and spit 3 or 4 times daily for at least 5 days.  Then may be used as needed thereafter for thrush 11/15/18  Yes Wilhelmina Mcardle, MD  potassium chloride (KLOR-CON) 10 MEQ tablet Take 4 tablets (40 meq) by mouth once daily Patient taking differently: Take 20 mEq by mouth daily. 20 every other day, alternate with 40 every other day 07/02/19  Yes Gollan, Kathlene November, MD  Probiotic Product (PROBIOTIC PO) Take 1 capsule by mouth 2 (two) times a day.    Yes [provider]  rOPINIRole (REQUIP) 0.25 MG tablet Take 1 tablet (0.25 mg total) by mouth 2 (two) times daily. 02/25/19  Yes McLean-Scocuzza, Nino Glow, MD  sodium chloride (OCEAN) 0.65 % SOLN nasal spray Place 1-2 sprays into both nostrils as needed for  congestion. 03/12/19  Yes McLean-Scocuzza, Nino Glow, MD  Spacer/Aero-Holding Chambers [facility] ADVANTAGE-LG MASK) MISC Use as directed with inhaler diag  j44.1 09/28/17  Yes Allyne Gee, MD  theophylline (UNIPHYL) 400 MG 24 hr tablet Take 1 tablet (400 mg total) by mouth every morning. 03/03/19  Yes Wilhelmina Mcardle, MD  Tiotropium Bromide Monohydrate (SPIRIVA RESPIMAT) 2.5 MCG/ACT AERS Inhale 2 puffs into the lungs daily. 01/08/19  Yes Wilhelmina Mcardle, MD  traZODone (DESYREL) 50 MG tablet Take 1-2 tablets (50-100 mg total) by mouth at bedtime as needed for sleep. 01/14/19  Yes McLean-Scocuzza, Nino Glow, MD  AMBULATORY NON FORMULARY MEDICATION 1 each by Does not apply route as needed. Please provide patient with "So Clean" CPAP cleaning device to be used as needed. Patient not taking: Reported on 07/28/2019 12/04/18   Wilhelmina Mcardle, MD  doxycycline (VIBRA-TABS) 100 MG tablet Take 1 tablet (100 mg total) by mouth daily. For 1 more day. Patient not taking: Reported on 07/28/2019 07/11/19   Mayo, Pete Pelt, MD  ketoconazole (NIZORAL) 2 % shampoo Apply 1 application topically 2 (two) times a week. Let stand for 5 minutes Patient not taking: Reported on 2019-08-28 05/30/18   McLean-Scocuzza, Nino Glow, MD   Allergies  Allergen Reactions  .  Asa [Aspirin] Other (See Comments)    Reaction: swelling of the right side.  . Bee Venom Swelling  . Codeine Other (See Comments)    Reaction: Difficulty breathing  . Ibuprofen Other (See Comments)    Reaction: Swelling of the right side.  . Iodinated Diagnostic Agents Other (See Comments)    Reaction:  Unknown  Other reaction(s): Unknown    FAMILY HISTORY:  family history includes Alcohol abuse in his daughter; Arthritis in his daughter, father, and mother; Asthma in his daughter and mother; Bipolar disorder in his mother; Birth defects in his brother, paternal grandmother, and sister; CAD in his father; Depression in his daughter, mother, and paternal  grandmother; Drug abuse in his daughter; Hearing loss in his father and mother; Heart disease in his brother, father, mother, and paternal grandmother; Hyperlipidemia in his father and mother; Hypertension in his father and mother; Intellectual disability in his daughter; Kidney disease in his mother and son; Malignant hypertension in his mother; Miscarriages / Stillbirths in his daughter; Peripheral Artery Disease in his mother; Rheum arthritis in his mother; Stroke in his father. SOCIAL HISTORY:  reports that he has been smoking cigarettes. He has a 4.00 pack-year smoking history. He has never used smokeless tobacco. He reports that he does not drink alcohol or use drugs.  REVIEW OF SYSTEMS:   Unable to obtain due to critical illness  REVIEW OF SYSTEMS  PATIENT IS UNABLE TO PROVIDE COMPLETE REVIEW OF SYSTEM S DUE TO SEVERE CRITICAL ILLNESS AND ENCEPHALOPATHY  VITAL SIGNS: Temp:  [95.9 F (35.5 C)-100.1 F (37.8 C)] 98.2 F (36.8 C) (11/17 1208) Pulse Rate:  [76-115] 82 (11/17 1208) Resp:  [17-25] 21 (11/17 1208) BP: (91-134)/(65-95) 96/80 (11/17 1208) SpO2:  [89 %-98 %] 97 % (11/17 1208) FiO2 (%):  [40 %-50 %] 50 % (11/17 1200) Weight:  [98.9 kg] 98.9 kg (11/17 1208)   No intake/output data recorded. No intake/output data recorded.   SpO2: 97 % O2 Flow Rate (L/min): 3.5 L/min FiO2 (%): 50 %   Physical Examination:  GENERAL:critically ill appearing, +resp distress HEAD: Normocephalic, atraumatic.  EYES: Pupils equal, round, reactive to light.  No scleral icterus.  MOUTH: Moist mucosal membrane. NECK: Supple. No JVD.  PULMONARY: +rhonchi, +wheezing CARDIOVASCULAR: S1 and S2. Regular rate and rhythm. No murmurs, rubs, or gallops.  GASTROINTESTINAL: Soft, nontender, -distended. No masses. Positive bowel sounds. No hepatosplenomegaly.  MUSCULOSKELETAL: No swelling, clubbing, or edema.  NEUROLOGIC: obtunded SKIN:intact,warm,dry  I personally reviewed lab work that was  obtained in last 24 hrs. CXR Independently reviewed-b/l effusions, lower lobe opacities  MEDICATIONS: I have reviewed all medications and confirmed regimen as documented   CULTURE RESULTS   Recent Results (from the past 240 hour(s))  Culture, blood (routine x 2)     Status: None (Preliminary result)   Collection Time: 2019/08/09  4:35 AM   Specimen: BLOOD  Result Value Ref Range Status   Specimen Description BLOOD RIGHT WRIST   Final   Special Requests   Final    BOTTLES DRAWN AEROBIC AND ANAEROBIC Blood Culture results may not be optimal due to an excessive volume of blood received in culture bottles   Culture   Final    NO GROWTH <12 HOURS Performed at [facility], [address] [place], [place] [address]    Report Status PENDING  Incomplete  Culture, blood (routine x 2)     Status: None (Preliminary result)   Collection Time: 2019-08-09  4:35 AM   Specimen:  BLOOD  Result Value Ref Range Status   Specimen Description BLOOD LEFT HAND  Final   Special Requests   Final    BOTTLES DRAWN AEROBIC AND ANAEROBIC Blood Culture results may not be optimal due to an excessive volume of blood received in culture bottles   Culture   Final    NO GROWTH <12 HOURS Performed at [facility], [address] [place], [name] [address]    Report Status PENDING  Incomplete  SARS Coronavirus 2 by RT PCR (hospital order, performed in [facility] lab) Nasopharyngeal Nasopharyngeal Swab     Status: None   Collection Time: 05/21/2019  9:41 AM   Specimen: Nasopharyngeal Swab  Result Value Ref Range Status   SARS Coronavirus 2 NEGATIVE NEGATIVE Final    Comment: (NOTE) If result is NEGATIVE SARS-CoV-2 target nucleic acids are NOT DETECTED. The SARS-CoV-2 RNA is generally detectable in upper and lower  respiratory specimens during the acute phase of infection. The lowest  concentration of SARS-CoV-2 viral copies this assay can detect is 250  copies / mL. A negative result  does not preclude SARS-CoV-2 infection  and should not be used as the sole basis for treatment or other  patient management decisions.  A negative result may occur with  improper specimen collection / handling, submission of specimen other  than nasopharyngeal swab, presence of viral mutation(s) within the  areas targeted by this assay, and inadequate number of viral copies  (<250 copies / mL). A negative result must be combined with clinical  observations, patient history, and epidemiological information. If result is POSITIVE SARS-CoV-2 target nucleic acids are DETECTED. The SARS-CoV-2 RNA is generally detectable in upper and lower  respiratory specimens dur ing the acute phase of infection.  Positive  results are indicative of active infection with SARS-CoV-2.  Clinical  correlation with patient history and other diagnostic information is  necessary to determine patient infection status.  Positive results do  not rule out bacterial infection or co-infection with other viruses. If result is PRESUMPTIVE POSTIVE SARS-CoV-2 nucleic acids MAY BE PRESENT.   A presumptive positive result was obtained on the submitted specimen  and confirmed on repeat testing.  While 2019 novel coronavirus  (SARS-CoV-2) nucleic acids may be present in the submitted sample  additional confirmatory testing may be necessary for epidemiological  and / or clinical management purposes  to differentiate between  SARS-CoV-2 and other Sarbecovirus currently known to infect humans.  If clinically indicated additional testing with an alternate test  methodology [phone]([id number]) is advised. The SARS-CoV-2 RNA is generally  detectable in upper and lower respiratory sp ecimens during the acute  phase of infection. The expected result is Negative. Fact Sheet for Patients:  BoilerBrush.com.cyhttps://www.fda.gov/media/136312/download Fact Sheet for Healthcare Providers: https://pope.com/https://www.fda.gov/media/136313/download This test is not yet approved or  cleared by the [place] FDA and has been authorized for detection and/or diagnosis of SARS-CoV-2 by FDA under an Emergency Use Authorization (EUA).  This EUA will remain in effect (meaning this test can be used) for the duration of the COVID-19 declaration under Section 564(b)(1) of the Act, 21 U.S.C. section 360bbb-3(b)(1), unless the authorization is terminated or revoked sooner. Performed at [facility], [address] [place], [name] [address]           IMAGING    Dg Chest 2 View  Result Date: 09/02/202020 CLINICAL DATA:  Fever, shortness of breath EXAM: CHEST - 2 VIEW COMPARISON:  07/09/2019 FINDINGS: Heart is upper limits normal  in size. Bibasilar opacities are again noted, similar to prior study. No visible effusions. No acute bony abnormality. IMPRESSION: Bibasilar opacities, similar to prior study. This could reflect atelectasis or infiltrates/pneumonia. Electronically Signed   By: Charlett Nose M.D.   On: 07/24/2019 00:47   Dg Abdomen 1 View  Result Date: 08/04/2019 CLINICAL DATA:  OG tube placement. EXAM: ABDOMEN - 1 VIEW COMPARISON:  11/30/2015 FINDINGS: The OG tube tip is in the fundus of the stomach and could be advanced. The visualized bowel gas pattern is normal. Markings are slightly accentuated at the lung bases. IMPRESSION: 1. OG tube tip is in the fundus of the stomach and could be advanced slightly. 2. No acute abnormalities in the abdomen. Electronically Signed   By: Francene Boyers M.D.   On: 07/28/2019 11:42   Dg Chest Portable 1 View  Result Date: 08/06/2019 CLINICAL DATA:  Hypoxia EXAM: PORTABLE CHEST 1 VIEW COMPARISON:  August 05, 2019 study obtained earlier in the day FINDINGS: Endotracheal tube tip is 4.7 cm above the carina. No pneumothorax. There is patchy airspace opacity in both lung bases with associated atelectasis and pleural effusions. Pleural effusions are larger than on study obtained earlier in the day. There appears to be bullous  disease in the upper lobes, particularly on the right. Heart size is normal. There is diminished pulmonary vascularity in the upper lobes, likely due to bullous disease. Pulmonary vascularity appears stable. No adenopathy. No bone lesions evident. IMPRESSION: Endotracheal tube as described without pneumothorax. There are new small pleural effusions bilaterally. There is patchy airspace consolidation and atelectasis in the lung bases. Probable bullous disease in the upper lobes. Stable cardiac silhouette. Electronically Signed   By: Bretta Bang III M.D.   On: 07/27/2019 10:03    CBC    Component Value Date/Time   WBC 15.0 (H) 07/29/2019 0021   RBC 4.08 (L) 08/01/2019 0021   HGB 12.9 (L) 07/21/2019 0021   HGB 13.6 07/23/2014 2053   HCT 42.2 08/03/2019 0021   HCT 42.0 07/23/2014 2053   PLT 182 08/04/2019 0021   PLT 213 07/23/2014 2053   MCV 103.4 (H) 07/24/2019 0021   MCV 96 07/23/2014 2053   MCH 31.6 08/14/2019 0021   MCHC 30.6 08/10/2019 0021   RDW 13.5 07/31/2019 0021   RDW 14.2 07/23/2014 2053   LYMPHSABS 1.4 05/23/2019 0819   LYMPHSABS 0.7 (L) 03/08/2014 0623   MONOABS 0.4 05/23/2019 0819   MONOABS 0.7 03/08/2014 0623   EOSABS 0.2 05/23/2019 0819   EOSABS 0.0 03/08/2014 0623   BASOSABS 0.1 05/23/2019 0819   BASOSABS 0.0 03/08/2014 0623    BMP Latest Ref Rng & Units 07/27/2019 07/23/2019 07/10/2019  Glucose 70 - 99 mg/dL 161(W) 95 960(A)  BUN 8 - 23 mg/dL 18 14 54(U)  Creatinine 0.61 - 1.24 mg/dL 9.81 1.91 4.78  BUN/Creat Ratio 10 - 24 - - -  Sodium 135 - 145 mmol/L 141 146(H) 142  Potassium 3.5 - 5.1 mmol/L 3.7 4.6 5.0  Chloride 98 - 111 mmol/L 99 100 102  CO2 22 - 32 mmol/L 34(H) 37(H) 34(H)  Calcium 8.9 - 10.3 mg/dL 2.9(F) 9.3 9.4      Indwelling Urinary Catheter continued, requirement due to   Reason to continue Indwelling Urinary Catheter strict Intake/Output monitoring for hemodynamic instability         Ventilator continued, requirement due to severe  respiratory failure   Ventilator Sedation RASS 0 to -2      ASSESSMENT AND PLAN  SYNOPSIS   Severe ACUTE Hypoxic and Hypercapnic Respiratory Failure from COPD exacerbation also from acute diastolic dysfunction possible pneumonia with signs and symptoms of sepsis -continue Full MV support -continue Bronchodilator Therapy -Wean Fio2 and PEEP as tolerated   ACUTE DIASTOLIC CARDIAC FAILURE- -oxygen as needed -Lasix as tolerated -follow up cardiac enzymes as indicated   SEVERE COPD EXACERBATION -continue IV steroids as prescribed -continue NEB THERAPY as prescribed -morphine as needed -wean fio2 as needed and tolerated   NEUROLOGY - intubated and sedated - minimal sedation to achieve a RASS goal: -1   CARDIAC ICU monitoring  ID Continue IV abx for now  GI GI PROPHYLAXIS as indicated  NUTRITIONAL STATUS DIET-->TF's as tolerated Constipation protocol as indicated   ENDO - will use ICU hypoglycemic\Hyperglycemia protocol if needed    ELECTROLYTES -follow labs as needed -replace as needed -pharmacy consultation and following   DVT/GI PRX ordered TRANSFUSIONS AS NEEDED MONITOR FSBS ASSESS the need for LABS    Critical Care Time devoted to patient care services described in this note is 45 minutes.   Overall, patient is critically ill, prognosis is guarded.   high risk for cardiac arrest and death.    Lucie Leather, M.D.  [facility]  Medical Director [facility] Medical Director [facility] Cardio-Pulmonary Department

## 2019-08-05 NOTE — ED Provider Notes (Signed)
    Discussed with the hospital team and the pulmonary team.  Patient with worsening mental status and the PCO2 stayed about the same therefore they are requesting patient be intubated.  Patient also approved for rapid coronavirus testing.  If positive patient will need to be transferred.   PROCEDURES  Procedure(s) performed (including Critical Care):  Procedure Name: Intubation Date/Time: 07/31/2019 9:47 AM Performed by: Vanessa Coalmont, MD Pre-anesthesia Checklist: Patient identified, Patient being monitored, Emergency Drugs available, Timeout performed and Suction available Oxygen Delivery Method: Non-rebreather mask Preoxygenation: Pre-oxygenation with 100% oxygen Induction Type: Rapid sequence Ventilation: Mask ventilation without difficulty Laryngoscope Size: Glidescope and 4 Grade View: Grade IV Tube size: 7.5 mm Number of attempts: 1 Airway Equipment and Method: Rigid stylet Placement Confirmation: ETT inserted through vocal cords under direct vision,  CO2 detector and Breath sounds checked- equal and bilateral Tube secured with: ETT holder Difficulty Due To: Difficulty was anticipated      Coronavirus test was negative.  PCO2 is improving with ventilation.  Patient will be admitted to our ICU   ____________________________________________    Vanessa Dermott, MD 08/15/2019 [phone]

## 2019-08-05 NOTE — Progress Notes (Signed)
Patient with end stage COPD with significant PULM HTN and COR PULMONALE  Patient supposedly with recurrent PE and DVT and needs long term Anticoagulation if there are no contraindications.

## 2019-08-05 NOTE — Progress Notes (Signed)
Patient transported on vent with RN to ICU bed. Patient tolerated transport well, no complications.

## 2019-08-05 NOTE — ED Notes (Signed)
ED TO INPATIENT HANDOFF REPORT  ED Nurse Name and Phone #: Morrie Sheldon, RN [phone]  S Name/Age/Gender [place] Nick[place] Gidney 63 y.o. male Room/Bed: ED04A/ED04A  Code Status   Code Status: Full Code  Home/SNF/Other Home Patient oriented to: UTA; pt intubated Is this baseline? No   Triage Complete: Triage complete  Chief Complaint Fever, fatigue, shortness of breath  Triage Note Pt c/o generalized malaise, fever and increased SOB from chronic x2 days. Pt has hx/o COPD/Asthma and on 2L O2. Tonight pt has increased O2 to 3.5L and O2 in triage is 95%.    Allergies Allergies  Allergen Reactions  . Asa [Aspirin] Other (See Comments)    Reaction: swelling of the right side.  . Bee Venom Swelling  . Codeine Other (See Comments)    Reaction: Difficulty breathing  . Ibuprofen Other (See Comments)    Reaction: Swelling of the right side.  . Iodinated Diagnostic Agents Other (See Comments)    Reaction:  Unknown  Other reaction(s): Unknown    Level of Care/Admitting Diagnosis ED Disposition    ED Disposition Condition Comment   Admit  Hospital Area: [facility] [[id number]]  Level of Care: Stepdown [14]  Covid Evaluation: Person Under Investigation (PUI)  Diagnosis: Acute respiratory failure with hypoxia [facility]) [[id number]]  Admitting Physician: Eduard Clos [phone]  Attending Physician: Eduard Clos [phone]  Estimated length of stay: past midnight tomorrow  Certification:: I certify this patient will need inpatient services for at least 2 midnights  PT Class (Do Not Modify): Inpatient [101]  PT Acc Code (Do Not Modify): Private [1]       B Medical/Surgery History Past Medical History:  Diagnosis Date  . Allergy   . Anxiety   . Asthma   . Chronic diastolic CHF (congestive heart failure) (HCC)    a. echo 07/2013: EF 60-65%, DD, biatrial dilatation, Ao sclerosis, dilated RV, moderate pulmonary HTN, elevated CV and RA pressures; b. patient reported echo at  Dr. Milta Deiters office 02/2015 - his office does not have record of him being a pt there c. echo 11/2015: EF 60-65%, Grade 1 DD, mod-severe pulm pressures  . Chronic respiratory failure (HCC)    a. on 2L via nasal cannula; b. secondary to COPD  . COPD (chronic obstructive pulmonary disease) (HCC)    on 2L continuous   . Depression   . Emphysema of lung (HCC)   . GERD (gastroesophageal reflux disease)   . HCAP (healthcare-associated pneumonia)    01/22/18-01/28/18   . Headache   . Hypertension   . Leg DVT (deep venous thromboembolism), acute, bilateral (HCC)    02/26/18  . Personal history of tobacco use, presenting hazards to health 08/17/2015  . Pulmonary embolism (HCC)    02/26/18  . Pulmonary HTN (HCC)   . Tobacco abuse    Past Surgical History:  Procedure Laterality Date  . ABDOMINAL SURGERY    . ADENOIDECTOMY    . CARPAL TUNNEL RELEASE Right   . corpal tunnel Right   . ELBOW SURGERY Right    repaired tendon  . hemmorhoid N/A   . HEMORRHOID SURGERY    . NASAL SINUS SURGERY    . NASAL SINUS SURGERY     1970s  . NOSE SURGERY    . reflux surgery     1994  . ROTATOR CUFF REPAIR Right   . SHOULDER SURGERY    . TENNIS ELBOW RELEASE/NIRSCHEL PROCEDURE Right   . TONSILLECTOMY    . URETHRA SURGERY  surgery 6 times from age [age] yrs old  . URETHRA SURGERY       A IV Location/Drains/Wounds Patient Lines/Drains/Airways Status   Active Line/Drains/Airways    Name:   Placement date:   Placement time:   Site:   Days:   Peripheral IV 07/28/2019 Right Wrist   07/26/2019    0400    Wrist   less than 1   Peripheral IV 07/28/2019 Left Hand   07/23/2019    0433    Hand   less than 1   Airway 7.5 mm   07/20/2019    0950     less than 1          Intake/Output Last 24 hours No intake or output data in the 24 hours ending 07/22/2019 1053  Labs/Imaging Results for orders placed or performed during the hospital encounter of 07/24/2019 (from the past 48 hour(s))  Basic metabolic panel     Status:  Abnormal   Collection Time: 08/08/2019 12:21 AM  Result Value Ref Range   Sodium 141 135 - 145 mmol/L   Potassium 3.7 3.5 - 5.1 mmol/L   Chloride 99 98 - 111 mmol/L   CO2 34 (H) 22 - 32 mmol/L   Glucose, Bld 114 (H) 70 - 99 mg/dL   BUN 18 8 - 23 mg/dL   Creatinine, Ser 1.61 0.61 - 1.24 mg/dL   Calcium 8.8 (L) 8.9 - 10.3 mg/dL   GFR calc non Af Amer >60 >60 mL/min   GFR calc Af Amer >60 >60 mL/min   Anion gap 8 5 - 15    Comment: Performed at [facility] [address] [place], [place] [address]  CBC     Status: Abnormal   Collection Time: 08/16/2019 12:21 AM  Result Value Ref Range   WBC 15.0 (H) 4.0 - 10.5 K/uL   RBC 4.08 (L) 4.22 - 5.81 MIL/uL   Hemoglobin 12.9 (L) 13.0 - 17.0 g/dL   HCT 54.0 98.1 - 19.1 %   MCV 103.4 (H) 80.0 - 100.0 fL   MCH 31.6 26.0 - 34.0 pg   MCHC 30.6 30.0 - 36.0 g/dL   RDW 47.8 29.5 - 62.1 %   Platelets 182 150 - 400 K/uL   nRBC 0.0 0.0 - 0.2 %    Comment: Performed at [facility], [address] [place], [place] [address]  Troponin I (High Sensitivity)     Status: None   Collection Time: 08/01/2019 12:21 AM  Result Value Ref Range   Troponin I (High Sensitivity) 14 <18 ng/L    Comment: (NOTE) Elevated high sensitivity troponin I (hsTnI) values and significant  changes across serial measurements may suggest ACS but many other  chronic and acute conditions are known to elevate hsTnI results.  Refer to the "Links" section for chest pain algorithms and additional  guidance. Performed at [facility], [address] [place], [place] [address]   Troponin I (High Sensitivity)     Status: None   Collection Time: 08/06/2019  4:17 AM  Result Value Ref Range   Troponin I (High Sensitivity) 13 <18 ng/L    Comment: (NOTE) Elevated high sensitivity troponin I (hsTnI) values and significant  changes across serial measurements may suggest ACS but many other  chronic and acute conditions are known to elevate hsTnI results.   Refer to the "Links" section for chest pain algorithms and additional  guidance. Performed at [facility], [address], [place], [place]  [id number]   Brain natriuretic peptide     Status: Abnormal   Collection Time: 07-18-19  4:17 AM  Result Value Ref Range   B Natriuretic Peptide 154.0 (H) 0.0 - 100.0 pg/mL    Comment: Performed at [facility], [address] [place], [name] [id number]  Procalcitonin     Status: None   Collection Time: 07-18-19  4:17 AM  Result Value Ref Range   Procalcitonin 0.22 ng/mL    Comment:        Interpretation: PCT (Procalcitonin) <= 0.5 ng/mL: Systemic infection (sepsis) is not likely. Local bacterial infection is possible. (NOTE)       Sepsis PCT Algorithm           Lower Respiratory Tract                                      Infection PCT Algorithm    ----------------------------     ----------------------------         PCT < 0.25 ng/mL                PCT < 0.10 ng/mL         Strongly encourage             Strongly discourage   discontinuation of antibiotics    initiation of antibiotics    ----------------------------     -----------------------------       PCT 0.25 - 0.50 ng/mL            PCT 0.10 - 0.25 ng/mL               OR       >80% decrease in PCT            Discourage initiation of                                            antibiotics      Encourage discontinuation           of antibiotics    ----------------------------     -----------------------------         PCT >= 0.50 ng/mL              PCT 0.26 - 0.50 ng/mL               AND        <80% decrease in PCT             Encourage initiation of                                             antibiotics       Encourage continuation           of antibiotics    ----------------------------     -----------------------------        PCT >= 0.50 ng/mL                  PCT > 0.50 ng/mL               AND         increase in PCT  Strongly encourage                                       initiation of antibiotics    Strongly encourage escalation           of antibiotics                                     -----------------------------                                           PCT <= 0.25 ng/mL                                                 OR                                        > 80% decrease in PCT                                     Discontinue / Do not initiate                                             antibiotics Performed at [facility], [address] [place], [place] [address]   Blood gas, venous     Status: Abnormal   Collection Time: Aug 23, 2019  4:17 AM  Result Value Ref Range   pH, Ven 7.20 (L) 7.250 - 7.430   pCO2, Ven 104 (HH) 44.0 - 60.0 mmHg    Comment: CRITICAL RESULT CALLED TO, READ BACK BY AND VERIFIED WITH: DR Royden Purl [phone]    pO2, Ven 77.0 (H) 32.0 - 45.0 mmHg   Bicarbonate 40.6 (H) 20.0 - 28.0 mmol/L   Acid-Base Excess 8.4 (H) 0.0 - 2.0 mmol/L   O2 Saturation 91.9 %   Patient temperature 37.0    Collection site VEIN    Sample type VENOUS     Comment: Performed at [facility], [address] [place], [place] [address]  Lactic acid, plasma     Status: None   Collection Time: 2019/08/23  4:17 AM  Result Value Ref Range   Lactic Acid, Venous 0.6 0.5 - 1.9 mmol/L    Comment: Performed at [facility], [address] [place], [place] [address]  Culture, blood (routine x 2)     Status: None (Preliminary result)   Collection Time: Aug 23, 2019  4:35 AM   Specimen: BLOOD  Result Value Ref Range   Specimen Description BLOOD RIGHT WRIST     Special Requests      BOTTLES DRAWN AEROBIC AND ANAEROBIC Blood Culture results may not be optimal due to an excessive volume of blood received in culture bottles   Culture      NO GROWTH <12 HOURS Performed  at [facility], [address], [place], [place] [address]    Report Status PENDING   Culture, blood (routine x 2)      Status: None (Preliminary result)   Collection Time: 08-31-2019  4:35 AM   Specimen: BLOOD  Result Value Ref Range   Specimen Description BLOOD LEFT HAND    Special Requests      BOTTLES DRAWN AEROBIC AND ANAEROBIC Blood Culture results may not be optimal due to an excessive volume of blood received in culture bottles   Culture      NO GROWTH <12 HOURS Performed at [facility], [address] [place], [place] [address]    Report Status PENDING   Troponin I (High Sensitivity)     Status: None   Collection Time: Aug 31, 2019  6:34 AM  Result Value Ref Range   Troponin I (High Sensitivity) 13 <18 ng/L    Comment: (NOTE) Elevated high sensitivity troponin I (hsTnI) values and significant  changes across serial measurements may suggest ACS but many other  chronic and acute conditions are known to elevate hsTnI results.  Refer to the "Links" section for chest pain algorithms and additional  guidance. Performed at [facility], [address] [place], [place] [address]   Brain natriuretic peptide     Status: Abnormal   Collection Time: 08/31/2019  6:34 AM  Result Value Ref Range   B Natriuretic Peptide 175.0 (H) 0.0 - 100.0 pg/mL    Comment: Performed at [facility], [address] [place], [place] [address]  Blood gas, arterial     Status: Abnormal   Collection Time: 2019/08/31  6:34 AM  Result Value Ref Range   FIO2 0.60    Delivery systems BILEVEL POSITIVE AIRWAY PRESSURE    Inspiratory PAP 20    Expiratory PAP 8.0    pH, Arterial 7.18 (LL) 7.350 - 7.450    Comment: CRITICAL RESULT CALLED TO, READ BACK BY AND VERIFIED WITH: DR MONKS [phone]    pCO2 arterial 105 (HH) 32.0 - 48.0 mmHg    Comment: CRITICAL RESULT CALLED TO, READ BACK BY AND VERIFIED WITH: DR MONKS [phone]`    pO2, Arterial 124 (H) 83.0 - 108.0 mmHg   Bicarbonate 39.2 (H) 20.0 - 28.0 mmol/L   Acid-Base Excess 6.8 (H) 0.0 - 2.0 mmol/L   O2 Saturation 97.9 %   Patient  temperature 37.0    Collection site LEFT RADIAL    Sample type ARTERIAL DRAW    Allens test (pass/fail) PASS PASS    Comment: Performed at [facility], [address] [place], [place] [address]  Lactic acid, plasma     Status: Abnormal   Collection Time: 2019-08-31  6:35 AM  Result Value Ref Range   Lactic Acid, Venous 0.4 (L) 0.5 - 1.9 mmol/L    Comment: Performed at [facility], [address] [place], [place] [address]  SARS Coronavirus 2 by RT PCR (hospital order, performed in [facility] hospital lab) Nasopharyngeal Nasopharyngeal Swab     Status: None   Collection Time: 2019-08-31  9:41 AM   Specimen: Nasopharyngeal Swab  Result Value Ref Range   SARS Coronavirus 2 NEGATIVE NEGATIVE    Comment: (NOTE) If result is NEGATIVE SARS-CoV-2 target nucleic acids are NOT DETECTED. The SARS-CoV-2 RNA is generally detectable in upper and lower  respiratory specimens during the acute phase of infection. The lowest  concentration of SARS-CoV-2 viral copies this assay can detect is 250  copies / mL. A negative result does not preclude SARS-CoV-2 infection  and should not be used as the sole basis for treatment or other  patient management decisions.  A negative result may occur with  improper specimen collection / handling, submission of specimen other  than nasopharyngeal swab, presence of viral mutation(s) within the  areas targeted by this assay, and inadequate number of viral copies  (<250 copies / mL). A negative result must be combined with clinical  observations, patient history, and epidemiological information. If result is POSITIVE SARS-CoV-2 target nucleic acids are DETECTED. The SARS-CoV-2 RNA is generally detectable in upper and lower  respiratory specimens dur ing the acute phase of infection.  Positive  results are indicative of active infection with SARS-CoV-2.  Clinical  correlation with patient history and other diagnostic information is  necessary to  determine patient infection status.  Positive results do  not rule out bacterial infection or co-infection with other viruses. If result is PRESUMPTIVE POSTIVE SARS-CoV-2 nucleic acids MAY BE PRESENT.   A presumptive positive result was obtained on the submitted specimen  and confirmed on repeat testing.  While 2019 novel coronavirus  (SARS-CoV-2) nucleic acids may be present in the submitted sample  additional confirmatory testing may be necessary for epidemiological  and / or clinical management purposes  to differentiate between  SARS-CoV-2 and other Sarbecovirus currently known to infect humans.  If clinically indicated additional testing with an alternate test  methodology [phone]) is advised. The SARS-CoV-2 RNA is generally  detectable in upper and lower respiratory sp ecimens during the acute  phase of infection. The expected result is Negative. Fact Sheet for Patients:  BoilerBrush.com.cy Fact Sheet for Healthcare Providers: https://pope.com/ This test is not yet approved or cleared by the [place] FDA and has been authorized for detection and/or diagnosis of SARS-CoV-2 by FDA under an Emergency Use Authorization (EUA).  This EUA will remain in effect (meaning this test can be used) for the duration of the COVID-19 declaration under Section 564(b)(1) of the Act, 21 U.S.C. section 360bbb-3(b)(1), unless the authorization is terminated or revoked sooner. Performed at [facility], [address] [place], [place] [address]    Dg Chest 2 View  Result Date: 08/02/2019 CLINICAL DATA:  Fever, shortness of breath EXAM: CHEST - 2 VIEW COMPARISON:  07/09/2019 FINDINGS: Heart is upper limits normal in size. Bibasilar opacities are again noted, similar to prior study. No visible effusions. No acute bony abnormality. IMPRESSION: Bibasilar opacities, similar to prior study. This could reflect atelectasis or  infiltrates/pneumonia. Electronically Signed   By: Charlett Nose M.D.   On: 08/08/2019 00:47   Dg Chest Portable 1 View  Result Date: 07/23/2019 CLINICAL DATA:  Hypoxia EXAM: PORTABLE CHEST 1 VIEW COMPARISON:  August 05, 2019 study obtained earlier in the day FINDINGS: Endotracheal tube tip is 4.7 cm above the carina. No pneumothorax. There is patchy airspace opacity in both lung bases with associated atelectasis and pleural effusions. Pleural effusions are larger than on study obtained earlier in the day. There appears to be bullous disease in the upper lobes, particularly on the right. Heart size is normal. There is diminished pulmonary vascularity in the upper lobes, likely due to bullous disease. Pulmonary vascularity appears stable. No adenopathy. No bone lesions evident. IMPRESSION: Endotracheal tube as described without pneumothorax. There are new small pleural effusions bilaterally. There is patchy airspace consolidation and atelectasis in the lung bases. Probable bullous disease in the upper lobes. Stable cardiac silhouette. Electronically Signed  By: Bretta Bang III M.D.   On: 07/24/2019 10:03    Pending Labs Unresulted Labs (From admission, onward)    Start     Ordered   08/06/19 0500  Basic metabolic panel  Tomorrow morning,   STAT     07/21/2019 0626   08/06/19 0500  CBC  Tomorrow morning,   STAT     08/16/2019 0626   07/31/2019 1045  Blood gas, arterial  Once,   STAT    Comments: ABG - 1 hour post intubation    07/22/2019 1017   08/14/2019 0948  Triglycerides  (propofol (DIPRIVAN) infusion)  Every 72 hours,   STAT    Comments: while on propofol (DIPRIVAN)    07/30/2019 0947   08/13/2019 0840  Fibrin derivatives D-Dimer ([facility] only)  Once,   STAT     07/20/2019 0839   07/25/2019 0840  Lactate dehydrogenase  Once,   STAT     07/26/2019 0839   08/09/2019 0840  Hepatic function panel  Once,   STAT     08/17/2019 0839   08/15/2019 0840  C-reactive protein  Once,   STAT     08/13/2019 0839    08/12/2019 0839  Ferritin  Once,   STAT     07/21/2019 0839   08/03/2019 0636  Theophylline level  ONCE - STAT,   STAT     08/07/2019 0635   07/21/2019 0629  Strep pneumoniae urinary antigen  Once,   STAT     08/04/2019 0629   08/01/2019 0629  Legionella Pneumophila Serogp 1 Ur Ag  Once,   STAT     07/26/2019 0629          Vitals/Pain Today's Vitals   08/17/2019 0900 07/28/2019 0930 08/01/2019 1000 08/02/2019 1030  BP: 128/80 124/80 111/76 110/72  Pulse: (!) 103 (!) 104 91 76  Resp: 19 (!) 25 (!) 22 (!) 22  Temp:    (!) 95.9 F (35.5 C)  TempSrc:      SpO2: 90% 91% 98% (!) 89%  PainSc:        Isolation Precautions No active isolations  Medications Medications  furosemide (LASIX) tablet 40 mg (has no administration in time range)  ALPRAZolam (XANAX) tablet 1 mg (has no administration in time range)  escitalopram (LEXAPRO) tablet 20 mg (has no administration in time range)  traZODone (DESYREL) tablet 50-100 mg (has no administration in time range)  apixaban (ELIQUIS) tablet 5 mg (has no administration in time range)  Acetylcysteine CAPS 600 mg (has no administration in time range)  B-complex with vitamin C tablet 1 tablet (has no administration in time range)  fluticasone (FLONASE) 50 MCG/ACT nasal spray 2 spray (has no administration in time range)  theophylline (UNIPHYL) 400 MG 24 hr tablet 400 mg (has no administration in time range)  acetaminophen (TYLENOL) tablet 650 mg (has no administration in time range)    Or  acetaminophen (TYLENOL) suppository 650 mg (has no administration in time range)  ondansetron (ZOFRAN) tablet 4 mg (has no administration in time range)    Or  ondansetron (ZOFRAN) injection 4 mg (has no administration in time range)  albuterol (VENTOLIN HFA) 108 (90 Base) MCG/ACT inhaler 2 puff (2 puffs Inhalation Not Given 07/23/2019 0946)  cefTRIAXone (ROCEPHIN) 2 g in sodium chloride 0.9 % 100 mL IVPB (has no administration in time range)  methylPREDNISolone sodium succinate  (SOLU-MEDROL) 40 mg/mL injection 40 mg (has no administration in time range)  doxycycline (VIBRAMYCIN) 100 mg in sodium  chloride 0.9 % 250 mL IVPB (has no administration in time range)  fentaNYL in NS (20mcg/ml) infusion-PREMIX ( Intravenous Not Given 08/29/2019 0955)  propofol (DIPRIVAN) 1000 MG/100ML infusion (10 mcg/kg/min  102.1 kg Intravenous New Bag/Given 08-29-2019 0950)  ipratropium-albuterol (DUONEB) 0.5-2.5 (3) MG/3ML nebulizer solution 3 mL (has no administration in time range)  budesonide (PULMICORT) nebulizer solution 0.5 mg (has no administration in time range)  cefTRIAXone (ROCEPHIN) 1 g in sodium chloride 0.9 % 100 mL IVPB (0 g Intravenous Stopped 29-Aug-2019 0546)  azithromycin (ZITHROMAX) 500 mg in sodium chloride 0.9 % 250 mL IVPB (0 mg Intravenous Stopped 08/29/2019 0555)  acetaminophen (TYLENOL) tablet 1,000 mg (1,000 mg Oral Given Aug 29, 2019 0450)  sodium chloride 0.9 % bolus 500 mL (0 mLs Intravenous Stopped 2019-08-29 0546)  methylPREDNISolone sodium succinate (SOLU-MEDROL) 125 mg/2 mL injection 125 mg (125 mg Intravenous Given 2019-08-29 0455)  ipratropium-albuterol (DUONEB) 0.5-2.5 (3) MG/3ML nebulizer solution 3 mL (3 mLs Nebulization Given August 29, 2019 0455)  albuterol (PROVENTIL) (2.5 MG/3ML) 0.083% nebulizer solution 5 mg (5 mg Nebulization Given 2019/08/29 0648)  etomidate (AMIDATE) injection 25 mg (25 mg Intravenous Given 08/29/2019 0939)  succinylcholine (ANECTINE) injection 150 mg (150 mg Intravenous Given 2019-08-29 0940)    Mobility walks Moderate fall risk   Focused Assessments Pulmonary Assessment Handoff:  Lung sounds: Bilateral Breath Sounds: Diminished, Intubated        R Recommendations: See Admitting Provider Note  Report given to:   Additional Notes: Pt on BiPap previously due to CO2 level, patient became unresponsive, currently intubated.

## 2019-08-05 NOTE — Consult Note (Signed)
Pulmonary Medicine          Date: 07/31/2019,   MRN# [id number] Dustin [name] 1956-03-12     Admission                  Current       CHIEF COMPLAINT:   Acute hypoxemic hypercapnic respiratory failure   HISTORY OF PRESENT ILLNESS   This is a 63 year old male with a history of heart failure with preserved EF, pulmonary hypertension, history of PE and DVT, centrilobular emphysema who came into the ED due to worsening shortness of breath in the setting of febrile illness.  Patient had Covid testing which has not returned yet.  I was consulted by Dr. Marlon Pel to evaluate patient for acute hypoxemic hypercapnic respiratory failure.  Evaluation of his arterial blood gas shows severely elevated PCO2 and hypoxemia despite BiPAP.  On my evaluation patient is obtunded despite prolonged period of BiPAP trial.  I recommended endotracheal intubation as patient is currently full code.  His COVID-19 testing is not back yet and he is a PUI.  I discussed endotracheal intubation with Dr. Mayford Knife in the ER as well as primary team with additional recommendation to upgrade patient to medical intensive care unit for further management and chest imaging to narrow differential diagnosis including recurrent PE, COVID-19, CHF exacerbation COPD exacerbation.   PAST MEDICAL HISTORY   Past Medical History:  Diagnosis Date  . Allergy   . Anxiety   . Asthma   . Chronic diastolic CHF (congestive heart failure) (HCC)    a. echo 07/2013: EF 60-65%, DD, biatrial dilatation, Ao sclerosis, dilated RV, moderate pulmonary HTN, elevated CV and RA pressures; b. patient reported echo at Dr. Milta Deiters office 02/2015 - his office does not have record of him being a pt there c. echo 11/2015: EF 60-65%, Grade 1 DD, mod-severe pulm pressures  . Chronic respiratory failure (HCC)    a. on 2L via nasal cannula; b. secondary to COPD  . COPD (chronic obstructive pulmonary disease) (HCC)    on 2L continuous   .  Depression   . Emphysema of lung (HCC)   . GERD (gastroesophageal reflux disease)   . HCAP (healthcare-associated pneumonia)    01/22/18-01/28/18   . Headache   . Hypertension   . Leg DVT (deep venous thromboembolism), acute, bilateral (HCC)    02/26/18  . Personal history of tobacco use, presenting hazards to health 08/17/2015  . Pulmonary embolism (HCC)    02/26/18  . Pulmonary HTN (HCC)   . Tobacco abuse      SURGICAL HISTORY   Past Surgical History:  Procedure Laterality Date  . ABDOMINAL SURGERY    . ADENOIDECTOMY    . CARPAL TUNNEL RELEASE Right   . corpal tunnel Right   . ELBOW SURGERY Right    repaired tendon  . hemmorhoid N/A   . HEMORRHOID SURGERY    . NASAL SINUS SURGERY    . NASAL SINUS SURGERY     1970s  . NOSE SURGERY    . reflux surgery     1994  . ROTATOR CUFF REPAIR Right   . SHOULDER SURGERY    . TENNIS ELBOW RELEASE/NIRSCHEL PROCEDURE Right   . TONSILLECTOMY    . URETHRA SURGERY     surgery 6 times from age [age] yrs old  . URETHRA SURGERY       FAMILY HISTORY   Family History  Problem Relation Age of Onset  . CAD Father   .  Hyperlipidemia Father   . Stroke Father   . Heart disease Father   . Arthritis Father   . Hearing loss Father   . Hypertension Father   . Hypertension Mother   . Peripheral Artery Disease Mother   . Rheum arthritis Mother   . Asthma Mother   . Bipolar disorder Mother   . Depression Mother   . Malignant hypertension Mother   . Arthritis Mother   . Hearing loss Mother   . Heart disease Mother   . Hyperlipidemia Mother   . Kidney disease Mother   . Birth defects Brother   . Heart disease Brother   . Alcohol abuse Daughter   . Arthritis Daughter   . Asthma Daughter   . Depression Daughter   . Drug abuse Daughter   . Miscarriages / [place] Daughter   . Intellectual disability Daughter   . Kidney disease Son   . Birth defects Paternal Grandmother   . Depression Paternal Grandmother   . Heart disease  Paternal Grandmother   . Birth defects Sister   . Diabetes Neg Hx      SOCIAL HISTORY   Social History   Tobacco Use  . Smoking status: Current Every Day Smoker    Packs/day: 0.10    Years: 40.00    Pack years: 4.00    Types: Cigarettes    Last attempt to quit: 01/14/2018    Years since quitting: 1.5  . Smokeless tobacco: Never Used  Substance Use Topics  . Alcohol use: No  . Drug use: No     MEDICATIONS    Home Medication:  Current Outpatient Rx  . Order #: [id number] Class: Historical Med  . Order #: [id number] Class: Normal  . Order #: [id number] Class: Print  . Order #: [id number] Class: Normal  . Order #: [id number] Class: Print  . Order #: [id number] Class: Historical Med  . Order #: [id number] Class: Historical Med  . Order #: [id number] Class: Normal  . Order #: [id number] Class: Historical Med  . Order #: [id number] Class: Normal  . Order #: [id number] Class: Normal  . Order #: [id number] Class: Normal  . Order #: [id number] Class: Normal  . Order #: [id number] Class: Normal  . Order #: [id number] Class: No Print  . Order #: [id number] Class: Normal  . Order #: [id number] Class: Normal  . Order #: [id number] Class: Normal  . Order #: [id number] Class: Normal  . Order #: [id number] Class: Historical Med  . Order #: [id number] Class: Normal  . Order #: [id number] Class: Normal  . Order #: [id number] Class: Normal  . Order #: [id number] Class: Historical Med  . Order #: [id number] Class: Normal  . Order #: [id number] Class: Normal  . Order #: [id number] Class: Normal  . Order #: [id number] Class: Normal  . Order #: [id number] Class: Normal  . Order #: [id number] Class: Normal    Current Medication:  Current Facility-Administered Medications:  .  acetaminophen (TYLENOL) tablet 650 mg, 650 mg, Oral, Q6H PRN **OR** acetaminophen (TYLENOL) suppository 650 mg, 650 mg, Rectal, Q6H PRN, Eduard ClosKakrakandy, Arshad N, MD .  Acetylcysteine CAPS 600 mg, 600 mg, Oral, Daily, Toniann FailKakrakandy, Arshad N, MD .  albuterol (VENTOLIN  HFA) 108 (90 Base) MCG/ACT inhaler 2 puff, 2 puff, Inhalation, Q4H, Eduard ClosKakrakandy, Arshad N, MD .  ALPRAZolam Prudy Feeler(XANAX) tablet 1 mg, 1 mg, Oral, TID PRN, Eduard ClosKakrakandy, Arshad N, MD .  apixaban (ELIQUIS) tablet 5 mg, 5 mg, Oral, BID, Eduard ClosKakrakandy, Arshad N, MD .  B-complex with vitamin C tablet 1 tablet, 1 tablet, Oral, Daily, Midge MiniumKakrakandy, Arshad N, MD .  cefTRIAXone (ROCEPHIN) 2 g in sodium chloride 0.9 % 100  mL IVPB, 2 g, Intravenous, Q24H, Eduard Clos, MD .  doxycycline (VIBRAMYCIN) 100 mg in sodium chloride 0.9 % 250 mL IVPB, 100 mg, Intravenous, Q12H, Eduard Clos, MD .  escitalopram (LEXAPRO) tablet 20 mg, 20 mg, Oral, QHS, Eduard Clos, MD .  fluticasone (FLONASE) 50 MCG/ACT nasal spray 2 spray, 2 spray, Each Nare, Daily PRN, Eduard Clos, MD .  furosemide (LASIX) tablet 40 mg, 40 mg, Oral, Daily, Eduard Clos, MD .  gabapentin (NEURONTIN) capsule 300-600 mg, 300-600 mg, Oral, TID, Eduard Clos, MD .  magnesium oxide (MAG-OX) tablet 400 mg, 400 mg, Oral, Daily, Eduard Clos, MD .  methylPREDNISolone sodium succinate (SOLU-MEDROL) 40 mg/mL injection 40 mg, 40 mg, Intravenous, Q12H, Eduard Clos, MD .  mometasone-formoterol (DULERA) 200-5 MCG/ACT inhaler 2 puff, 2 puff, Inhalation, BID, Eduard Clos, MD .  ondansetron (ZOFRAN) tablet 4 mg, 4 mg, Oral, Q6H PRN **OR** ondansetron (ZOFRAN) injection 4 mg, 4 mg, Intravenous, Q6H PRN, Eduard Clos, MD .  potassium chloride SA (KLOR-CON) CR tablet 20 mEq, 20 mEq, Oral, Daily, Eduard Clos, MD .  rOPINIRole (REQUIP) tablet 0.25 mg, 0.25 mg, Oral, BID, Eduard Clos, MD .  theophylline (UNIPHYL) 400 MG 24 hr tablet 400 mg, 400 mg, Oral, Daily, Eduard Clos, MD .  Tiotropium Bromide Monohydrate AERS 2 puff, 2 puff, Inhalation, Daily, Eduard Clos, MD .  traZODone (DESYREL) tablet 50-100 mg, 50-100 mg, Oral, QHS PRN, Eduard Clos, MD  Current  Outpatient Medications:  .  Acetylcysteine (NAC) 600 MG CAPS, Take 1 tablet by mouth daily. , Disp: , Rfl:  .  ALPRAZolam (XANAX) 1 MG tablet, Take 1 tablet (1 mg total) by mouth 3 (three) times daily as needed for anxiety., Disp: 15 tablet, Rfl: 0 .  AMBULATORY NON FORMULARY MEDICATION, 1 each by Does not apply route as needed. Please provide patient with "So Clean" CPAP cleaning device to be used as needed. (Patient not taking: Reported on 07/28/2019), Disp: 1 Device, Rfl: 0 .  antiseptic oral rinse (BIOTENE) LIQD, 15 mLs by Mouth Rinse route daily as needed for dry mouth., Disp: 473 mL, Rfl: 12 .  apixaban (ELIQUIS) 5 MG TABS tablet, Take 1 tablet (5 mg total) by mouth 2 (two) times daily., Disp: 180 tablet, Rfl: 3 .  azithromycin (ZITHROMAX) 250 MG tablet, Take 250 mg by mouth daily. Take one tablet by mouth every other day- patient reports that this was prescribed by pulmonary provider in October 2020, Disp: , Rfl:  .  b complex vitamins capsule, Take 1 capsule by mouth daily., Disp: , Rfl:  .  carbamide peroxide (DEBROX) 6.5 % OTIC solution, Place 5 drops into both ears 2 (two) times daily. Prn x 4-7 days, Disp: 15 mL, Rfl: 11 .  Cholecalciferol (VITAMIN D) 2000 units tablet, Take 2,000 Units by mouth 2 (two) times a day. , Disp: , Rfl:  .  clobetasol (TEMOVATE) 0.05 % external solution, Apply 1 application topically 2 (two) times daily. Prn to scalp and ears, Disp: 50 mL, Rfl: 11 .  diltiazem (CARDIZEM) 30 MG tablet, Take 1 tablet (30 mg total) by mouth 3 (three) times daily as needed. (Patient taking differently: Take 30 mg by mouth 3 (three) times daily as needed (heartrate). ), Disp: 180 tablet, Rfl: 3 .  doxycycline (VIBRA-TABS) 100 MG tablet, Take 1 tablet (100 mg total) by mouth daily. For 1 more day. (Patient not taking: Reported on 07/28/2019), Disp:  1 tablet, Rfl: 0 .  escitalopram (LEXAPRO) 20 MG tablet, Take 1 tablet (20 mg total) by mouth at bedtime., Disp: 90 tablet, Rfl: 3 .   fluticasone (FLONASE) 50 MCG/ACT nasal spray, Place 2 sprays into both nostrils daily as needed for allergies or rhinitis., Disp: 16 g, Rfl: 11 .  furosemide (LASIX) 40 MG tablet, Take 1 tablet (40 mg) by mouth twice daily, Disp: 180 tablet, Rfl: 3 .  gabapentin (NEURONTIN) 300 MG capsule, TAKE 1 TO 2 CAPSULES BY MOUTH THREE TIMES DAILY (Patient taking differently: Take 300-600 mg by mouth 3 (three) times daily. ), Disp: 520 capsule, Rfl: 11 .  hydrocortisone 2.5 % cream, Apply topically 2 (two) times daily as needed. face (Patient taking differently: Apply 1 application topically 2 (two) times daily as needed (skin irritations). (apply to the face)), Disp: 30 g, Rfl: 11 .  ipratropium-albuterol (DUONEB) 0.5-2.5 (3) MG/3ML SOLN, Take 3 mLs by nebulization every 6 (six) hours as needed. (Patient taking differently: Take 3 mLs by nebulization every 6 (six) hours as needed (shortness of breath/wheezing). ), Disp: 1080 mL, Rfl: 3 .  ketoconazole (NIZORAL) 2 % shampoo, Apply 1 application topically 2 (two) times a week. Let stand for 5 minutes, Disp: 120 mL, Rfl: 3 .  magnesium oxide (MAG-OX) 400 MG tablet, Take 400 mg by mouth daily., Disp: , Rfl:  .  mometasone-formoterol (DULERA) 200-5 MCG/ACT AERO, Inhale 2 puffs into the lungs 2 (two) times daily., Disp: 1 Inhaler, Rfl: 4 .  nystatin (MYCOSTATIN) 100000 UNIT/ML suspension, Use as directed 5 mLs (500,000 Units total) in the mouth or throat 4 (four) times daily. Swish and spit 3 or 4 times daily for at least 5 days.  Then may be used as needed thereafter for thrush, Disp: 473 mL, Rfl: 0 .  potassium chloride (KLOR-CON) 10 MEQ tablet, Take 4 tablets (40 meq) by mouth once daily (Patient taking differently: Take 20 mEq by mouth daily. 20 every other day, alternate with 40 every other day), Disp: 360 tablet, Rfl: 3 .  Probiotic Product (PROBIOTIC PO), Take 1 capsule by mouth 2 (two) times a day. , Disp: , Rfl:  .  rOPINIRole (REQUIP) 0.25 MG tablet, Take 1  tablet (0.25 mg total) by mouth 2 (two) times daily., Disp: 180 tablet, Rfl: 3 .  sodium chloride (OCEAN) 0.65 % SOLN nasal spray, Place 1-2 sprays into both nostrils as needed for congestion., Disp: 480 mL, Rfl: 12 .  Spacer/Aero-Holding Chambers (OPTICHAMBER ADVANTAGE-LG MASK) MISC, Use as directed with inhaler diag  j44.1, Disp: 2 each, Rfl: 1 .  theophylline (UNIPHYL) 400 MG 24 hr tablet, Take 1 tablet (400 mg total) by mouth every morning., Disp: 90 tablet, Rfl: 3 .  Tiotropium Bromide Monohydrate (SPIRIVA RESPIMAT) 2.5 MCG/ACT AERS, Inhale 2 puffs into the lungs daily., Disp: 3 Inhaler, Rfl: 3 .  traZODone (DESYREL) 50 MG tablet, Take 1-2 tablets (50-100 mg total) by mouth at bedtime as needed for sleep., Disp: 60 tablet, Rfl: 11    ALLERGIES   Asa [aspirin], Bee venom, Codeine, Ibuprofen, and Iodinated diagnostic agents     REVIEW OF SYSTEMS    Review of Systems:  Gen:  Denies  fever, sweats, chills weigh loss  HEENT: Denies blurred vision, double vision, ear pain, eye pain, hearing loss, nose bleeds, sore throat Cardiac:  No dizziness, chest pain or heaviness, chest tightness,edema Resp:   Denies cough or sputum porduction, shortness of breath,wheezing, hemoptysis,  Gi: Denies swallowing difficulty, stomach pain, nausea or  vomiting, diarrhea, constipation, bowel incontinence Gu:  Denies bladder incontinence, burning urine Ext:   Denies Joint pain, stiffness or swelling Skin: Denies  skin rash, easy bruising or bleeding or hives Endoc:  Denies polyuria, polydipsia , polyphagia or weight change Psych:   Denies depression, insomnia or hallucinations   Other:  All other systems negative   VS: BP 123/84   Pulse (!) 103   Temp 100.1 F (37.8 C) (Oral)   Resp 17   SpO2 94%      PHYSICAL EXAM    GENERAL: Chronically ill-appearing currently obtunded HEAD: Normocephalic, atraumatic.  EYES: Pupils equal, round, reactive to light. Extraocular muscles intact. No scleral  icterus.  MOUTH: Moist mucosal membrane. Dentition intact. No abscess noted.  EAR, NOSE, THROAT: Clear without exudates. No external lesions.  NECK: Supple. No thyromegaly. No nodules. No JVD.  PULMONARY: NIV background sound with decreased breath sounds bilaterally without wheezing on auscultation CARDIOVASCULAR: S1 and S2. Regular rate and rhythm. No murmurs, rubs, or gallops. No edema. Pedal pulses 2+ bilaterally.  GASTROINTESTINAL: Soft, nontender, nondistended. No masses. Positive bowel sounds. No hepatosplenomegaly.  MUSCULOSKELETAL: No swelling, clubbing, or edema. Range of motion full in all extremities.  NEUROLOGIC: GCS 4 SKIN: No ulceration, lesions, rashes, or cyanosis. Skin warm and dry. Turgor intact.         IMAGING    Dg Chest 1 View  Result Date: 07/09/2019 CLINICAL DATA:  Shortness of breath EXAM: CHEST  1 VIEW COMPARISON:  07/07/2019 FINDINGS: Heart is borderline in size. Bibasilar airspace opacities are again noted, unchanged. No effusions. No acute bony abnormality. Mild vascular congestion. IMPRESSION: Bibasilar atelectasis or infiltrates, unchanged since prior study. Electronically Signed   By: Rolm Baptise M.D.   On: 07/09/2019 09:48   Dg Chest 2 View  Result Date: 07/26/2019 CLINICAL DATA:  Fever, shortness of breath EXAM: CHEST - 2 VIEW COMPARISON:  07/09/2019 FINDINGS: Heart is upper limits normal in size. Bibasilar opacities are again noted, similar to prior study. No visible effusions. No acute bony abnormality. IMPRESSION: Bibasilar opacities, similar to prior study. This could reflect atelectasis or infiltrates/pneumonia. Electronically Signed   By: Rolm Baptise M.D.   On: 07/31/2019 00:47   Dg Chest 2 View  Result Date: 07/07/2019 CLINICAL DATA:  Shortness of breath EXAM: CHEST - 2 VIEW COMPARISON:  April 25, 2019 FINDINGS: There is underlying emphysematous change with bullous disease in the upper lobes. There is patchy atelectasis in the lung bases.  There is no frank edema or consolidation. Heart size is upper normal. The pulmonary vascularity reflects underlying emphysematous change with diminished vascularity in the upper lobes. No adenopathy evident. No bone lesions. IMPRESSION: Emphysematous change with bibasilar atelectasis. No frank edema or consolidation. Heart upper normal in size. Pulmonary vascularity reflects underlying emphysematous change. No adenopathy evident. Emphysema (ICD10-J43.9). Electronically Signed   By: Lowella Grip III M.D.   On: 07/07/2019 11:41      ASSESSMENT/PLAN   Acute hypoxemic and hypercapnic respiratory failure  -Unclear etiology at this time   -CXR with possible atypical pneumonia   -Patient with profound hypercapnia, hypoxemia and acidemia on ABG -Patient is full code recommend endotracheal intubation at this time with upgrade to medical intensive care unit for further management -Consider CHF, recurrent PE, community-acquired pneumonia, acute exacerbation of COPD -Discussed with Dr. Jimmye Norman ER, Dr. Lorre Munroe hospitalist -patient is PUI     Thank you for allowing me to participate in the care of this patient.  nt in counseling and  coordination of care.   Patient/Family are satisfied with care plan and all questions have been answered.  This document was prepared using Dragon voice recognition software and may include unintentional dictation errors.     Vida Rigger, M.D.  Division of Pulmonary & Critical Care Medicine  [facility]

## 2019-08-05 NOTE — Patient Outreach (Signed)
[place] [facility]) Care Management [place] Telephone Outreach Care Coordination  07/23/2019  WYNN ALLDREDGE 02/04/1956 [id number]  [place] Telephone Outreach Care Coordination re:  Atwood Adcock, 63 y/o male referred to [place] by [facility] Pharmacist embedded in patient's PCP office for assessment of community resource needs and ongoing reinforcement of self- health management of chronic disease state of COPD/ CHF. Patient has had no recent inpatient hospital admissions. Patient has history including, but not limited to, chronic respiratory failure; CHF; COPD on home O2 with ongoing tobacco use; pulmonary HTN; and anxiety  Since [facility] CM initially became involved in patient's care, he had recent hospitalization October 19-22, 2020 for COPD exacerbation/ acute on chronic respiratory failure  Unfortunately, patient re-[facility] ED this morning (26 days post-last hospital discharge) with shortness of breath and in acute respiratory failure; patient was admitted to ICU and emergently intubated.  Secure communication via EMR sent to [place]  and [place], notifying of patient's hospital re- admission  Plan:  [place] RN CM will follow patient's progress while hospitalized and collaborate with [facility] liaison's for discharge disposition  Oneta Rack, RN, BSN, [place] Coordinator [facility] Care Management  [phone]

## 2019-08-05 NOTE — Progress Notes (Signed)
[facility] Progress Note Patient Name: Dustin [name] DOB: 01/20/1956 MRN: [id number]   Date of Service  2019/08/09  HPI/Events of Note  Pt needs foley continuation order.  eICU Interventions  Order placed.        Kerry Kass Ogan 2019/08/09, 10:53 PM

## 2019-08-05 NOTE — Consult Note (Signed)
ANTICOAGULATION CONSULT NOTE - Initial Consult  Pharmacy Consult for Heparin Dosing Indication: venous thromboembolism  Patient Measurements: Height: 5\' 10"  (177.8 cm) Weight: 218 lb 0.6 oz (98.9 kg) IBW/kg (Calculated) : 73 Heparin Dosing Weight: 93.5 kg  Vital Signs: Temp: 98.2 F (36.8 C) (11/17 1208) Temp Source: Bladder (11/17 1208) BP: 96/80 (11/17 1208) Pulse Rate: 82 (11/17 1208)  Labs: Recent Labs    07/26/2019 0021 07/24/2019 0417 08/12/2019 0634 07/20/2019 0941  HGB 12.9*  --   --   --   HCT 42.2  --   --   --   PLT 182  --   --   --   CREATININE 1.13  --   --   --   TROPONINIHS 14 13 13 12     Estimated Creatinine Clearance: 76.9 mL/min (by C-G formula based on SCr of 1.13 mg/dL).  Assessment: Vangie Bicker. Cullipher is a 69 YOM admitted to the ICU on 07/24/2019 with respiratory failure requiring intubation. 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 Pharmacy has been consulted for heparin dosing.  Patient received dose of apixaban 5 mg PO @ 0865 today.  Baseline Labs: - Hemoglobin: 12.9 - Hematocrit: 42.2 - Platelets: 182 - aPTT: pending  Goal of Therapy:  - aPTT 66-102 seconds - Monitor platelets by anticoagulation protocol: Yes.   Plan:  - Initiate heparin drip at 1500 units/hr at 2200 (will defer bolus). - Check aPTT at 0400. - Will begin to monitor anti-Xa levels after 48 hours. - Will monitor platelets and H&H daily and make adjustments as appropriate.  Thank you for allowing pharmacy to be a part of this patient's care.  Raiford Simmonds, PharmD Candidate 07/26/2019,2:10 PM

## 2019-08-06 ENCOUNTER — Inpatient Hospital Stay: Payer: Self-pay

## 2019-08-06 LAB — MAGNESIUM: Magnesium: 2.4 mg/dL (ref 1.7–2.4)

## 2019-08-06 LAB — COMPREHENSIVE METABOLIC PANEL
ALT: 30 U/L (ref 0–44)
AST: 31 U/L (ref 15–41)
Albumin: 3.1 g/dL — ABNORMAL LOW (ref 3.5–5.0)
Alkaline Phosphatase: 62 U/L (ref 38–126)
Anion gap: 9 (ref 5–15)
BUN: 21 mg/dL (ref 8–23)
CO2: 32 mmol/L (ref 22–32)
Calcium: 8.9 mg/dL (ref 8.9–10.3)
Chloride: 100 mmol/L (ref 98–111)
Creatinine, Ser: 1.29 mg/dL — ABNORMAL HIGH (ref 0.61–1.24)
GFR calc Af Amer: >60 mL/min (ref >60)
GFR calc non Af Amer: 58 mL/min — ABNORMAL LOW (ref >60)
Glucose, Bld: 152 mg/dL — ABNORMAL HIGH (ref 70–99)
Potassium: 4.2 mmol/L (ref 3.5–5.1)
Sodium: 141 mmol/L (ref 135–145)
Total Bilirubin: 0.8 mg/dL (ref 0.3–1.2)
Total Protein: 6.1 g/dL — ABNORMAL LOW (ref 6.5–8.1)

## 2019-08-06 LAB — APTT
aPTT: >160 seconds (ref 24–36)
aPTT: 105 seconds — ABNORMAL HIGH (ref 24–36)
aPTT: 62 seconds — ABNORMAL HIGH (ref 24–36)
aPTT: 90 seconds — ABNORMAL HIGH (ref 24–36)

## 2019-08-06 LAB — CBC
HCT: 37.4 % — ABNORMAL LOW (ref 39.0–52.0)
Hemoglobin: 11.8 g/dL — ABNORMAL LOW (ref 13.0–17.0)
MCH: 31.7 pg (ref 26.0–34.0)
MCHC: 31.6 g/dL (ref 30.0–36.0)
MCV: 100.5 fL — ABNORMAL HIGH (ref 80.0–100.0)
Platelets: 224 10*3/uL (ref 150–400)
RBC: 3.72 MIL/uL — ABNORMAL LOW (ref 4.22–5.81)
RDW: 13.2 % (ref 11.5–15.5)
WBC: 13.7 10*3/uL — ABNORMAL HIGH (ref 4.0–10.5)
nRBC: 0 % (ref 0.0–0.2)

## 2019-08-06 LAB — GLUCOSE, CAPILLARY
Glucose-Capillary: 130 mg/dL — ABNORMAL HIGH (ref 70–99)
Glucose-Capillary: 118 mg/dL — ABNORMAL HIGH (ref 70–99)
Glucose-Capillary: 127 mg/dL — ABNORMAL HIGH (ref 70–99)

## 2019-08-06 LAB — PHOSPHORUS: Phosphorus: 3.1 mg/dL (ref 2.5–4.6)

## 2019-08-06 LAB — LEGIONELLA PNEUMOPHILA SEROGP 1 UR AG: L. pneumophila Serogp 1 Ur Ag: NEGATIVE

## 2019-08-06 LAB — THEOPHYLLINE LEVEL: Theophylline Lvl: 4.7 ug/mL — ABNORMAL LOW (ref 10.0–20.0)

## 2019-08-06 MED ORDER — PRO-STAT SUGAR FREE PO LIQD
60.0000 mL | Freq: Four times a day (QID) | ORAL | Status: DC
  Administered 2019-08-06 – 2019-08-20 (×55): 60 mL

## 2019-08-06 MED ORDER — INSULIN ASPART 100 UNIT/ML ~~LOC~~ SOLN
0.0000 [IU] | SUBCUTANEOUS | Status: DC
  Administered 2019-08-06 (×2): 1 [IU] via SUBCUTANEOUS
  Administered 2019-08-07: 2 [IU] via SUBCUTANEOUS
  Administered 2019-08-07 (×4): 1 [IU] via SUBCUTANEOUS
  Administered 2019-08-07: 2 [IU] via SUBCUTANEOUS
  Administered 2019-08-08 (×3): 1 [IU] via SUBCUTANEOUS
  Administered 2019-08-08: 08:00:00 2 [IU] via SUBCUTANEOUS
  Administered 2019-08-09 – 2019-08-16 (×7): 1 [IU] via SUBCUTANEOUS
  Administered 2019-08-16: 2 [IU] via SUBCUTANEOUS
  Administered 2019-08-16 – 2019-08-18 (×9): 1 [IU] via SUBCUTANEOUS
  Administered 2019-08-18: 2 [IU] via SUBCUTANEOUS
  Administered 2019-08-18 – 2019-08-19 (×4): 1 [IU] via SUBCUTANEOUS
  Administered 2019-08-19: 2 [IU] via SUBCUTANEOUS
  Administered 2019-08-19 (×3): 1 [IU] via SUBCUTANEOUS
  Administered 2019-08-20 (×2): 2 [IU] via SUBCUTANEOUS
  Administered 2019-08-20: 1 [IU] via SUBCUTANEOUS
  Filled 2019-08-06 (×39): qty 1

## 2019-08-06 MED ORDER — SODIUM CHLORIDE 0.9% FLUSH
10.0000 mL | INTRAVENOUS | Status: DC | PRN
  Administered 2019-08-06: 10 mL
  Filled 2019-08-06: qty 40

## 2019-08-06 MED ORDER — VITAL HIGH PROTEIN PO LIQD
1000.0000 mL | ORAL | Status: DC
  Administered 2019-08-06 – 2019-08-19 (×15): 1000 mL

## 2019-08-06 MED ORDER — SODIUM CHLORIDE 0.9 % IV SOLN
0.0000 ug/min | INTRAVENOUS | Status: DC
  Administered 2019-08-06: 30 ug/min via INTRAVENOUS
  Administered 2019-08-08: 10 ug/min via INTRAVENOUS
  Administered 2019-08-14: 15 ug/min via INTRAVENOUS
  Filled 2019-08-06 (×3): qty 40

## 2019-08-06 MED ORDER — ADULT MULTIVITAMIN LIQUID CH
15.0000 mL | Freq: Every day | ORAL | Status: DC
  Administered 2019-08-06 – 2019-08-20 (×15): 15 mL
  Filled 2019-08-06 (×15): qty 15

## 2019-08-06 MED ORDER — SODIUM CHLORIDE 0.9% FLUSH
10.0000 mL | Freq: Two times a day (BID) | INTRAVENOUS | Status: DC
  Administered 2019-08-06 – 2019-08-07 (×2): 10 mL
  Administered 2019-08-07: 30 mL
  Administered 2019-08-08 – 2019-08-09 (×3): 10 mL
  Administered 2019-08-09: 40 mL
  Administered 2019-08-10 – 2019-08-12 (×5): 10 mL
  Administered 2019-08-12: 40 mL
  Administered 2019-08-14 – 2019-08-15 (×3): 10 mL
  Administered 2019-08-15: 30 mL
  Administered 2019-08-16: 10 mL
  Administered 2019-08-16: 20 mL
  Administered 2019-08-17 – 2019-08-20 (×5): 10 mL

## 2019-08-06 MED ORDER — SODIUM CHLORIDE 0.9 % IV SOLN
0.0000 ug/min | INTRAVENOUS | Status: DC
  Filled 2019-08-06: qty 4

## 2019-08-06 MED ORDER — AZITHROMYCIN 500 MG PO TABS
500.0000 mg | ORAL_TABLET | Freq: Every day | ORAL | Status: AC
  Administered 2019-08-07 – 2019-08-09 (×3): 500 mg via ORAL
  Filled 2019-08-06 (×3): qty 1

## 2019-08-06 NOTE — Progress Notes (Signed)
Peripherally Inserted Central Catheter/Midline Placement  The IV Nurse has discussed with the patient and/or persons authorized to consent for the patient, the purpose of this procedure and the potential benefits and risks involved with this procedure.  The benefits include less needle sticks, lab draws from the catheter, and the patient may be discharged home with the catheter. Risks include, but not limited to, infection, bleeding, blood clot (thrombus formation), and puncture of an artery; nerve damage and irregular heartbeat and possibility to perform a PICC exchange if needed/ordered by physician.  Alternatives to this procedure were also discussed.  Bard Power PICC patient education guide, fact sheet on infection prevention and patient information card has been provided to patient /or left at bedside.    PICC/Midline Placement Documentation  PICC Triple Lumen 90/30/09 PICC Right Basilic 41 cm 0 cm (Active)  Indication for Insertion or Continuance of Line Vasoactive infusions 08/06/19 1700  Exposed Catheter (cm) 0 cm 08/06/19 1700  Site Assessment Clean;Dry;Intact 08/06/19 1700  Lumen #1 Status Flushed;Saline locked;Blood return noted 08/06/19 1700  Lumen #2 Status Flushed;Saline locked;Blood return noted 08/06/19 1700  Lumen #3 Status Flushed;Saline locked;Blood return noted 08/06/19 1700  Dressing Type Transparent;Securing device 08/06/19 1700  Dressing Status Clean;Dry;Intact;Antimicrobial disc in place 08/06/19 1700  Line Care Connections checked and tightened 08/06/19 1700  Dressing Intervention New dressing;Other (Comment) 08/06/19 1700  Dressing Change Due 08/13/19 08/06/19 1700    MD gave consent for medical necessity   Virgilio Belling 08/06/2019, 5:19 PM

## 2019-08-06 NOTE — Consult Note (Signed)
ANTICOAGULATION CONSULT NOTE - Initial Consult  Pharmacy Consult for Heparin Dosing Indication: venous thromboembolism  Patient Measurements: Height: 5\' 10"  (177.8 cm) Weight: 224 lb 13.9 oz (102 kg) IBW/kg (Calculated) : 73 Heparin Dosing Weight: 93.5 kg  Vital Signs: Temp: 98.4 F (36.9 C) (11/18 1000) Temp Source: Bladder (11/18 0800) BP: 94/53 (11/18 1000) Pulse Rate: 79 (11/18 1000)  Labs: Recent Labs    08-27-19 0021 08-27-19 0417 08-27-19 0634 27-Aug-2019 0941 08-27-2019 1422 27-Aug-2019 1433 08/06/19 0532 08/06/19 0808  HGB 12.9*  --   --   --   --   --  11.8*  --   HCT 42.2  --   --   --   --   --  37.4*  --   PLT 182  --   --   --   --   --  224  --   APTT  --   --   --   --   --  35 >160* 105*  CREATININE 1.13  --   --   --  1.26*  --  1.29*  --   TROPONINIHS 14 13 13 12   --   --   --   --     Estimated Creatinine Clearance: 68.3 mL/min (A) (by C-G formula based on SCr of 1.29 mg/dL (H)).  Assessment: Dustin [name] is a 60 YOM admitted to the ICU on 08-27-19 with respiratory failure requiring intubation. 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 Pharmacy has been consulted for heparin dosing.  Patient received dose of apixaban 5 mg PO @ 4128 yesterday (11/17).  11/18 @ 0808 aPTT 105 seconds, warranting reduction in drip rate.  Goal of Therapy:  - aPTT 66-102 seconds - Monitor platelets by anticoagulation protocol: Yes.   Plan:  - Reduced drip to a rate of 1150 units/hr. - Will check aPTT at 1500. - Continue to monitor platelets and H&H daily and adjust as appropriate. - Will monitor aPTTs until they correlate with anti-Xa levels. - Will begin to monitor anti-Xa levels starting on 11/20.  Thank you for allowing pharmacy to be a part of this patient's care.  Raiford Simmonds, PharmD Candidate 08/06/2019,11:03 AM

## 2019-08-06 NOTE — Progress Notes (Addendum)
CRITICAL CARE NOTE  CC  follow up respiratory failure due to COPD exacerbation   SUBJECTIVE Patient remains critically ill Prognosis is guarded Remains on vent at FiO2 50%.  On neo at 40 mcg    BP (!) 94/53   Pulse 79   Temp 98.4 F (36.9 C)   Resp 18   Ht '5\' 10"'  (1.778 m)   Wt 102 kg   SpO2 93%   BMI 32.27 kg/m    I/O last 3 completed shifts: In: 1192.8 [I.V.:1102.8; IV Piggyback:90] Out: 760 [Urine:760] Total I/O In: 888.3 [I.V.:638.2; IV Piggyback:250] Out: 15 [Urine:15]  SpO2: 93 % O2 Flow Rate (L/min): 3.5 L/min FiO2 (%): 50 %   SIGNIFICANT EVENTS 11/17: Intubated. Started on neo 11/18: Remains intubated, FiO2 50%. On neo at 40 mcg. Sedated on 15 mcg propofol and 200 mcg fentanyl. Responds to verbal stimuli and follows commands.   REVIEW OF SYSTEMS  PATIENT IS UNABLE TO PROVIDE COMPLETE REVIEW OF SYSTEMS DUE TO SEVERE CRITICAL ILLNESS   PHYSICAL EXAMINATION:  GENERAL:critically ill appearing, intubated, sedated  HEAD: Normocephalic, atraumatic.  EYES: Pupils equal, round, reactive to light.  No scleral icterus.  MOUTH: ETT and OGT in place  NECK: Supple.  PULMONARY: Distant breath sounds bilaterally  CARDIOVASCULAR: S1 and S2. Regular rate and rhythm. No murmurs, rubs, or gallops.  GASTROINTESTINAL: Soft, nontender, -distended. No masses. Positive bowel sounds. No hepatosplenomegaly.  MUSCULOSKELETAL: No swelling, clubbing, or edema.  NEUROLOGIC: obtunded, GCS<8 SKIN:intact,warm,dry  MEDICATIONS: I have reviewed all medications and confirmed regimen as documented   CULTURE RESULTS   Recent Results (from the past 240 hour(s))  Culture, blood (routine x 2)     Status: None (Preliminary result)   Collection Time: 08/11/2019  4:35 AM   Specimen: BLOOD  Result Value Ref Range Status   Specimen Description BLOOD RIGHT WRIST   Final   Special Requests   Final    BOTTLES DRAWN AEROBIC AND ANAEROBIC Blood Culture results may not be optimal due to an  excessive volume of blood received in culture bottles   Culture   Final    NO GROWTH 1 DAY Performed at [facility], [address] [place], [place] [address]    Report Status PENDING  Incomplete  Culture, blood (routine x 2)     Status: None (Preliminary result)   Collection Time: 08/07/2019  4:35 AM   Specimen: BLOOD  Result Value Ref Range Status   Specimen Description BLOOD LEFT HAND  Final   Special Requests   Final    BOTTLES DRAWN AEROBIC AND ANAEROBIC Blood Culture results may not be optimal due to an excessive volume of blood received in culture bottles   Culture   Final    NO GROWTH 1 DAY Performed at [facility], [address] [place], [place] [address]    Report Status PENDING  Incomplete  SARS Coronavirus 2 by RT PCR (hospital order, performed in [place] hospital lab) Nasopharyngeal Nasopharyngeal Swab     Status: None   Collection Time: 08/04/2019  9:41 AM   Specimen: Nasopharyngeal Swab  Result Value Ref Range Status   SARS Coronavirus 2 NEGATIVE NEGATIVE Final    Comment: (NOTE) If result is NEGATIVE SARS-CoV-2 target nucleic acids are NOT DETECTED. The SARS-CoV-2 RNA is generally detectable in upper and lower  respiratory specimens during the acute phase of infection. The lowest  concentration of SARS-CoV-2 viral copies this assay can detect is 250  copies / mL. A negative  result does not preclude SARS-CoV-2 infection  and should not be used as the sole basis for treatment or other  patient management decisions.  A negative result may occur with  improper specimen collection / handling, submission of specimen other  than nasopharyngeal swab, presence of viral mutation(s) within the  areas targeted by this assay, and inadequate number of viral copies  (<250 copies / mL). A negative result must be combined with clinical  observations, patient history, and epidemiological information. If result is POSITIVE SARS-CoV-2 target nucleic acids  are DETECTED. The SARS-CoV-2 RNA is generally detectable in upper and lower  respiratory specimens dur ing the acute phase of infection.  Positive  results are indicative of active infection with SARS-CoV-2.  Clinical  correlation with patient history and other diagnostic information is  necessary to determine patient infection status.  Positive results do  not rule out bacterial infection or co-infection with other viruses. If result is PRESUMPTIVE POSTIVE SARS-CoV-2 nucleic acids MAY BE PRESENT.   A presumptive positive result was obtained on the submitted specimen  and confirmed on repeat testing.  While 2019 novel coronavirus  (SARS-CoV-2) nucleic acids may be present in the submitted sample  additional confirmatory testing may be necessary for epidemiological  and / or clinical management purposes  to differentiate between  SARS-CoV-2 and other Sarbecovirus currently known to infect humans.  If clinically indicated additional testing with an alternate test  methodology [phone]) is advised. The SARS-CoV-2 RNA is generally  detectable in upper and lower respiratory sp ecimens during the acute  phase of infection. The expected result is Negative. Fact Sheet for Patients:  StrictlyIdeas.no Fact Sheet for Healthcare Providers: BankingDealers.co.za This test is not yet approved or cleared by the [place] FDA and has been authorized for detection and/or diagnosis of SARS-CoV-2 by FDA under an Emergency Use Authorization (EUA).  This EUA will remain in effect (meaning this test can be used) for the duration of the COVID-19 declaration under Section 564(b)(1) of the Act, 21 U.S.C. section 360bbb-3(b)(1), unless the authorization is terminated or revoked sooner. Performed at [facility], [address] [place], [place] [address]   MRSA PCR Screening     Status: Abnormal   Collection Time: 08/01/2019 12:11 PM   Specimen:  Nasal Mucosa; Nasopharyngeal  Result Value Ref Range Status   MRSA by PCR POSITIVE (A) NEGATIVE Final    Comment:        The GeneXpert MRSA Assay (FDA approved for NASAL specimens only), is one component of a comprehensive MRSA colonization surveillance program. It is not intended to diagnose MRSA infection nor to guide or monitor treatment for MRSA infections. RESULT CALLED TO, READ BACK BY AND VERIFIED WITH: CHARLIE WAITLEY 08/06/2019 1340 KLW Performed at [facility], [address] [place], [place] [address]           IMAGING    Dg Abdomen 1 View  Result Date: 08/18/2019 CLINICAL DATA:  OG tube placement. EXAM: ABDOMEN - 1 VIEW COMPARISON:  11/30/2015 FINDINGS: The OG tube tip is in the fundus of the stomach and could be advanced. The visualized bowel gas pattern is normal. Markings are slightly accentuated at the lung bases. IMPRESSION: 1. OG tube tip is in the fundus of the stomach and could be advanced slightly. 2. No acute abnormalities in the abdomen. Electronically Signed   By: Lorriane Shire M.D.   On: 08/06/2019 11:42       Indwelling Urinary Catheter continued, requirement due to  Reason to continue Indwelling Urinary Catheter strict Intake/Output monitoring for hemodynamic instability         Ventilator continued, requirement due to severe respiratory failure   Ventilator Sedation RASS 0 to -2      ASSESSMENT AND PLAN SYNOPSIS   Severe Acute Hypoxic and Hypercapnic Respiratory Failure due to severe COPD exacerbation, complicated by acute diastolic CHF probable underlying pneumonia -continue Full MV support -continue Bronchodilator Therapy -Wean Fio2 and PEEP as tolerated -will perform SAT/SBT when respiratory parameters are met -continue nebulizer treatments and steroids  -empiric antibiotics - azithromycin and rocephin   Septic and Hypovolemic Shock  -use vasopressors to keep MAP>65 - currently on 40 mcg neo, wean as tolerated   -decrease propofol, reassess BP  -follow ABG and LA -follow up cultures - cultures have no growth after 1 day  -emperic ABX   SEVERE COPD EXACERBATION -continue IV steroids as prescribed -continue NEB THERAPY as prescribed -morphine as needed -wean fio2 as needed and tolerated   Diastolic CHF -oxygen as needed -Lasix as tolerated -follow up cardiology recs  AKI -follow chem 7 -follow UO -continue Foley Catheter-assess need -Avoid nephrotoxic agents -Recheck creatinine - creatinine trending upward (1.29)  NEUROLOGY - intubated and sedated on propofol and fentanyl  - minimal sedation to achieve a RASS goal: -1 - responsive to verbal stimuli, able to follow commands   CARDIAC ICU monitoring  ID -continue IV abx as prescibed -follow up cultures   GI GI PROPHYLAXIS as indicated  NUTRITIONAL STATUS DIET-->TF's as tolerated Constipation protocol as indicated  ENDO - will use ICU hypoglycemic\Hyperglycemia protocol if indicated   ELECTROLYTES -follow labs as needed -replace as needed -pharmacy consultation and following   DVT/GI PRX ordered TRANSFUSIONS AS NEEDED MONITOR FSBS ASSESS the need for LABS as needed   Critical Care Time devoted to patient care services described in this note is 32 minutes.   Overall, patient is critically ill, prognosis is guarded.     Corrin Parker, M.D.  [facility]  Medical Director [place] Director [facility] Cardio-Pulmonary Department

## 2019-08-06 NOTE — Consult Note (Signed)
ANTICOAGULATION CONSULT NOTE   Pharmacy Consult for Heparin Dosing Indication: venous thromboembolism  Patient Measurements: Height: 5\' 10"  (177.8 cm) Weight: 224 lb 13.9 oz (102 kg) IBW/kg (Calculated) : 73 Heparin Dosing Weight: 93.5 kg  Vital Signs: Temp: 99.1 F (37.3 C) (11/18 2000) Temp Source: Bladder (11/18 1600) BP: 98/59 (11/18 2000) Pulse Rate: 66 (11/18 2000)  Labs: Recent Labs    2019/08/27 0021 2019-08-27 0417 2019/08/27 0634 08-27-19 0941 08/27/19 1422  08/06/19 0532 08/06/19 0808 08/06/19 1447  HGB 12.9*  --   --   --   --   --  11.8*  --   --   HCT 42.2  --   --   --   --   --  37.4*  --   --   PLT 182  --   --   --   --   --  224  --   --   APTT  --   --   --   --   --    < > >160* 105* 62*  CREATININE 1.13  --   --   --  1.26*  --  1.29*  --   --   TROPONINIHS 14 13 13 12   --   --   --   --   --    < > = values in this interval not displayed.    Estimated Creatinine Clearance: 68.3 mL/min (A) (by C-G formula based on SCr of 1.29 mg/dL (H)).  Assessment: Dustin [name] is a 63 YOM admitted to the ICU on 27-Aug-2019 with respiratory failure requiring intubation. 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 Pharmacy has been consulted for heparin dosing.  Patient received dose of apixaban 5 mg PO @ 8768 yesterday (11/17).  11/18 @ 0808 aPTT 105 seconds, warranting reduction in drip rate.  Goal of Therapy:  - aPTT 66-102 seconds - Monitor platelets by anticoagulation protocol: Yes.   Plan:  - Increase drip to a rate of 1300 units/hr. - Will check aPTT at 2300. - Continue to monitor platelets and H&H daily and adjust as appropriate. - Will monitor aPTTs until they correlate with anti-Xa levels. - Will begin to monitor anti-Xa levels starting on 11/20.  Thank you for allowing pharmacy to be a part of this patient's care.  MLS 08/06/2019,8:57 PM

## 2019-08-06 NOTE — Progress Notes (Signed)
Initial Nutrition Assessment  DOCUMENTATION CODES:   Obesity unspecified  INTERVENTION:  Initiate Vital High Protein at 20 mL/hr (480 mL goal daily volume) + Pro-Stat 60 mL QID per tube. Provides 1280 kcal, 162 grams of protein, 403 mL H2O daily. With current propofol rate provides 1522 kcal daily.  Provide liquid MVI daily per tube to meet 100% RDIs for vitamins/minerals.  Provide minimum free water flush of 30 mL Q4hrs.  NUTRITION DIAGNOSIS:   Inadequate oral intake related to inability to eat as evidenced by NPO status.  GOAL:   Provide needs based on ASPEN/SCCM guidelines  MONITOR:   Vent status, Labs, Weight trends, TF tolerance, I & O's  REASON FOR ASSESSMENT:   Ventilator    ASSESSMENT:   63 year old male with PMHx of asthma, chronic diastolic CHF, GERD, tobacco abuse, anxiety, depression, HTN, emphysema of lung, COPD on 2L [place] at baseline admitted with severe acute hypoxic and hypercapnic respiratory failure due to severe COPD exacerbation requiring intubation on 11/17, septic and hypovolemic shock, AKI.   Patient intubated and sedated. On PRVC mode with FiO2 50% and PEEP 5 cmH2O. Abdomen soft. Last BM unknown/PTA. Weight appears stable per chart. Patient does not meet criteria for malnutrition at this time.  Enteral Access: 16 Fr. OGT placed 11/17; terminates in stomach per abdominal x-ray 11/17; now 63 cm at corner of mouth after being advanced 3 cm  MAP: 61-75 mmHg  Patient is currently intubated on ventilator support Ve: 9.3 L/min Temp (24hrs), Avg:98 F (36.7 C), Min:97.5 F (36.4 C), Max:98.6 F (37 C)  Propofol: 9.18 mL/hr (242 kcal daily)  Medications reviewed and include: vitamin D3 2000 units daily, famotidine, Novolog 0-9 units Q4hrs, Solu-Medrol 40 mg BID IV, senna-docusate 2 tablets BID, azithromycin, ceftriaxone, fentanyl gtt, heparin gtt, phenylephrine gtt at 37 mcg/min, propofol gtt  Labs reviewed: CBG 130-134, Creatinine 1.29.  I/O: 760 mL  UOP yesterday (0.3 mL/kg/hr)  NUTRITION - FOCUSED PHYSICAL EXAM:    Most Recent Value  Orbital Region  No depletion  Upper Arm Region  No depletion  Thoracic and Lumbar Region  No depletion  Buccal Region  Unable to assess  Temple Region  No depletion  Clavicle Bone Region  No depletion  Clavicle and Acromion Bone Region  No depletion  Scapular Bone Region  Unable to assess  Dorsal Hand  No depletion  Patellar Region  No depletion  Anterior Thigh Region  No depletion  Posterior Calf Region  No depletion  Edema (RD Assessment)  Mild  Hair  Reviewed  Eyes  Unable to assess  Mouth  Unable to assess  Skin  Reviewed  Nails  Reviewed     Diet Order:   Diet Order    None     EDUCATION NEEDS:   No education needs have been identified at this time  Skin:  Skin Assessment: Reviewed RN Assessment  Last BM:  Unknown/PTA  Height:   Ht Readings from Last 1 Encounters:  07/26/2019 5\' 10"  (1.778 m)   Weight:   Wt Readings from Last 1 Encounters:  08/06/19 102 kg   Ideal Body Weight:  75.5 kg  BMI:  Body mass index is 32.27 kg/m.  Estimated Nutritional Needs:   Kcal:  1122-1428 (11-14 kcal/kg)  Protein:  151 grams (2 grams/kg IBW)  Fluid:  1.8 L/day  Jacklynn Barnacle, MS, RD, LDN Office: [phone] Pager: [phone] After Hours/Weekend Pager: [phone]

## 2019-08-06 NOTE — Consult Note (Signed)
ANTICOAGULATION CONSULT NOTE - Initial Consult  Pharmacy Consult for Heparin Dosing Indication: venous thromboembolism  Patient Measurements: Height: 5\' 10"  (177.8 cm) Weight: 224 lb 13.9 oz (102 kg) IBW/kg (Calculated) : 73 Heparin Dosing Weight: 93.5 kg  Vital Signs: Temp: 98.2 F (36.8 C) (11/18 0445) BP: 96/56 (11/18 0445) Pulse Rate: 71 (11/18 0445)  Labs: Recent Labs    Aug 20, 2019 0021 09/18/2019 0417 09/15/2019 0634 08/30/2019 0941 09/14/2019 1422 09/03/2019 1433 08/06/19 0532  HGB 12.9*  --   --   --   --   --  11.8*  HCT 42.2  --   --   --   --   --  37.4*  PLT 182  --   --   --   --   --  224  APTT  --   --   --   --   --  35 >160*  CREATININE 1.13  --   --   --  1.26*  --  1.29*  TROPONINIHS 14 13 13 12   --   --   --     Estimated Creatinine Clearance: 68.3 mL/min (A) (by C-G formula based on SCr of 1.29 mg/dL (H)).  Assessment: Dustin [name] is a 36 YOM admitted to the ICU on 08/19/2019 with respiratory failure requiring intubation. 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 Pharmacy has been consulted for heparin dosing.  Patient received dose of apixaban 5 mg PO @ 9450 today.  Baseline Labs: - Hemoglobin: 12.9 - Hematocrit: 42.2 - Platelets: 182 - aPTT: pending  Goal of Therapy:  - aPTT 66-102 seconds - Monitor platelets by anticoagulation protocol: Yes.   Plan:  11/18 @ 0530 aPTT > 160 seconds. Per RN level was drawn from line heparin was running, while it was running, will get a confirmatory level and spoke w/ lab to have phleb draw level from opposite side of heparin drip and spoke to RN to ensure heparin drip is stopped until confirmatory comes back.  Tobie Lords, PharmD, BCPS Clinical Pharmacist 08/06/2019,7:20 AM

## 2019-08-06 NOTE — Progress Notes (Signed)
Pharmacy Electrolyte Monitoring Consult:  Dustin [name]. Raptis is a 54 YOM admitted to the ICU on 07/26/2019 with respiratory failure requiring intubation from COPD, CHF, and possible PNA. 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  Patient is receiving Solu-Medrol 40 mg IV BID. Furosemide was discontinued on admission secondary to vasopressor requirements. Per AM rounds, tube feeds will begin for this patient today.  Labs:  Sodium (mmol/L)  Date Value  08/06/2019 141  04/01/2019 145 (H)  07/23/2014 139   Potassium (mmol/L)  Date Value  08/06/2019 4.2  07/23/2014 4.1   Magnesium (mg/dL)  Date Value  08/06/2019 2.4   Phosphorus (mg/dL)  Date Value  08/06/2019 3.1   Calcium (mg/dL)  Date Value  08/06/2019 8.9   Calcium, Total (mg/dL)  Date Value  07/23/2014 8.4 (L)   Albumin (g/dL)  Date Value  08/06/2019 3.1 (L)  07/23/2014 2.8 (L)   Corrected Ca: 9.1 mg/dL   Assessment/Plan:  Electrolytes:  - No electrolyte replacement warranted at this time. - Will check BMP with AM labs.   Goals of Therapy: - K ~ 4 - Mg ~ 2  - All other electrolytes within normal limits.   Glucose:  - Blood glucose readings ranging 130s-160s over past 24 hours.  - Patient is on 0-9 units q4h SSI. - Will continue to monitor and consult with diabetes team as necessary.  Constipation:  - Patient receiving Senokot-S 2 tablets VT BID (LBM: NTD, admit 11/17). - He is on fentanyl 275 mcg/hr for pain management. - Will continue to monitor and adjust as needs arise.  Thank you for allowing pharmacy to be a part of this patient's care.  Raiford Simmonds, PharmD Candidate 08/06/2019 11:11 AM

## 2019-08-07 ENCOUNTER — Inpatient Hospital Stay: Payer: Medicare Other

## 2019-08-07 DIAGNOSIS — J9601 Acute respiratory failure with hypoxia: Secondary | ICD-10-CM

## 2019-08-07 LAB — MAGNESIUM: Magnesium: 2.3 mg/dL (ref 1.7–2.4)

## 2019-08-07 LAB — CBC
HCT: 35.1 % — ABNORMAL LOW (ref 39.0–52.0)
Hemoglobin: 11.3 g/dL — ABNORMAL LOW (ref 13.0–17.0)
MCH: 32.6 pg (ref 26.0–34.0)
MCHC: 32.2 g/dL (ref 30.0–36.0)
MCV: 101.2 fL — ABNORMAL HIGH (ref 80.0–100.0)
Platelets: 214 10*3/uL (ref 150–400)
RBC: 3.47 MIL/uL — ABNORMAL LOW (ref 4.22–5.81)
RDW: 13.5 % (ref 11.5–15.5)
WBC: 15.3 10*3/uL — ABNORMAL HIGH (ref 4.0–10.5)
nRBC: 0 % (ref 0.0–0.2)

## 2019-08-07 LAB — BASIC METABOLIC PANEL
Anion gap: 10 (ref 5–15)
BUN: 25 mg/dL — ABNORMAL HIGH (ref 8–23)
CO2: 30 mmol/L (ref 22–32)
Calcium: 8.7 mg/dL — ABNORMAL LOW (ref 8.9–10.3)
Chloride: 99 mmol/L (ref 98–111)
Creatinine, Ser: 1.02 mg/dL (ref 0.61–1.24)
GFR calc Af Amer: >60 mL/min (ref >60)
GFR calc non Af Amer: >60 mL/min (ref >60)
Glucose, Bld: 141 mg/dL — ABNORMAL HIGH (ref 70–99)
Potassium: 4.4 mmol/L (ref 3.5–5.1)
Sodium: 139 mmol/L (ref 135–145)

## 2019-08-07 LAB — GLUCOSE, CAPILLARY
Glucose-Capillary: 112 mg/dL — ABNORMAL HIGH (ref 70–99)
Glucose-Capillary: 130 mg/dL — ABNORMAL HIGH (ref 70–99)
Glucose-Capillary: 130 mg/dL — ABNORMAL HIGH (ref 70–99)
Glucose-Capillary: 134 mg/dL — ABNORMAL HIGH (ref 70–99)
Glucose-Capillary: 141 mg/dL — ABNORMAL HIGH (ref 70–99)
Glucose-Capillary: 157 mg/dL — ABNORMAL HIGH (ref 70–99)
Glucose-Capillary: 174 mg/dL — ABNORMAL HIGH (ref 70–99)

## 2019-08-07 LAB — BRAIN NATRIURETIC PEPTIDE: B Natriuretic Peptide: 164 pg/mL — ABNORMAL HIGH (ref 0.0–100.0)

## 2019-08-07 LAB — APTT: aPTT: 78 seconds — ABNORMAL HIGH (ref 24–36)

## 2019-08-07 LAB — PHOSPHORUS: Phosphorus: 3.3 mg/dL (ref 2.5–4.6)

## 2019-08-07 MED ORDER — ALPRAZOLAM 0.5 MG PO TABS
0.5000 mg | ORAL_TABLET | Freq: Three times a day (TID) | ORAL | Status: DC
  Administered 2019-08-07 – 2019-08-18 (×34): 0.5 mg
  Filled 2019-08-07 (×34): qty 1

## 2019-08-07 MED ORDER — POLYETHYLENE GLYCOL 3350 17 G PO PACK
17.0000 g | PACK | Freq: Every day | ORAL | Status: DC
  Administered 2019-08-07 – 2019-08-18 (×11): 17 g
  Filled 2019-08-07 (×11): qty 1

## 2019-08-07 MED ORDER — VASOPRESSIN 20 UNIT/ML IV SOLN
0.0300 [IU]/min | INTRAVENOUS | Status: DC
  Administered 2019-08-07 – 2019-08-09 (×3): 0.03 [IU]/min via INTRAVENOUS
  Filled 2019-08-07 (×3): qty 2

## 2019-08-07 NOTE — Progress Notes (Addendum)
CRITICAL CARE NOTE  CC  follow up respiratory failure due to COPD exacerbation   SUBJECTIVE Patient remains critically ill Prognosis is guarded Remains on vent at FiO2 55%.  On neo at 22 mcg, vasopressin at .03 units  Increased agitation and hypoxia last night   BP 126/68   Pulse (!) 57   Temp 98.8 F (37.1 C)   Resp 18   Ht 5\' 10"  (1.778 m)   Wt 105 kg   SpO2 96%   BMI 33.21 kg/m    I/O last 3 completed shifts: In: 3583.2 [I.V.:3233.2; IV Piggyback:350] Out: 1130 [Urine:1130] Total I/O In: 420.4 [I.V.:420.4] Out: 220 [Urine:220]  SpO2: 96 % O2 Flow Rate (L/min): 3.5 L/min FiO2 (%): 55 %   SIGNIFICANT EVENTS 11/17: Intubated. Started on neo 11/18: PICC line placed. Remains intubated, FiO2 50%. On neo at 40 mcg. Sedated on 15 mcg propofol and 200 mcg fentanyl. Responds to verbal stimuli and follows commands.  11/19: Remains intubated and sedated. FiO2 55%. On neo at 22, vasopressin .03. Sedated with 25 propofol and 300 fentanyl. Unresponsive to verbal stimuli.   REVIEW OF SYSTEMS  PATIENT IS UNABLE TO PROVIDE COMPLETE REVIEW OF SYSTEMS DUE TO SEVERE CRITICAL ILLNESS   PHYSICAL EXAMINATION:  GENERAL:critically ill appearing, intubated, sedated  HEAD: Normocephalic, atraumatic.  EYES: Pupils equal, round, reactive to light.  No scleral icterus.  MOUTH: ETT and OGT in place NECK: Supple.  PULMONARY: Distant breath sounds bilaterally. Inspiratory wheezing CARDIOVASCULAR: S1 and S2. Regular rate and rhythm. No murmurs, rubs, or gallops.  GASTROINTESTINAL: Soft, nontender, -distended. No masses. Positive bowel sounds. No hepatosplenomegaly.  MUSCULOSKELETAL: No swelling, clubbing, or edema.  NEUROLOGIC: obtunded, GCS<8 SKIN:intact,warm,dry  MEDICATIONS: I have reviewed all medications and confirmed regimen as documented   CULTURE RESULTS   Recent Results (from the past 240 hour(s))  Culture, blood (routine x 2)     Status: None (Preliminary result)   Collection Time: Nov 29, 2018  4:35 AM   Specimen: BLOOD  Result Value Ref Range Status   Specimen Description BLOOD RIGHT WRIST   Final   Special Requests   Final    BOTTLES DRAWN AEROBIC AND ANAEROBIC Blood Culture results may not be optimal due to an excessive volume of blood received in culture bottles   Culture   Final    NO GROWTH 2 DAYS Performed at [facility], [address] [place], [name] [address]    Report Status PENDING  Incomplete  Culture, blood (routine x 2)     Status: None (Preliminary result)   Collection Time: Nov 29, 2018  4:35 AM   Specimen: BLOOD  Result Value Ref Range Status   Specimen Description BLOOD LEFT HAND  Final   Special Requests   Final    BOTTLES DRAWN AEROBIC AND ANAEROBIC Blood Culture results may not be optimal due to an excessive volume of blood received in culture bottles   Culture   Final    NO GROWTH 2 DAYS Performed at [facility], [address] [place], [name] [address]    Report Status PENDING  Incomplete  SARS Coronavirus 2 by RT PCR (hospital order, performed in [facility]) Nasopharyngeal Nasopharyngeal Swab     Status: None   Collection Time: Nov 29, 2018  9:41 AM   Specimen: Nasopharyngeal Swab  Result Value Ref Range Status   SARS Coronavirus 2 NEGATIVE NEGATIVE Final    Comment: (NOTE) If result is NEGATIVE SARS-CoV-2 target nucleic acids are NOT DETECTED. The SARS-CoV-2 RNA is  generally detectable in upper and lower  respiratory specimens during the acute phase of infection. The lowest  concentration of SARS-CoV-2 viral copies this assay can detect is 250  copies / mL. A negative result does not preclude SARS-CoV-2 infection  and should not be used as the sole basis for treatment or other  patient management decisions.  A negative result may occur with  improper specimen collection / handling, submission of specimen other  than nasopharyngeal swab, presence of viral mutation(s) within the  areas  targeted by this assay, and inadequate number of viral copies  (<250 copies / mL). A negative result must be combined with clinical  observations, patient history, and epidemiological information. If result is POSITIVE SARS-CoV-2 target nucleic acids are DETECTED. The SARS-CoV-2 RNA is generally detectable in upper and lower  respiratory specimens dur ing the acute phase of infection.  Positive  results are indicative of active infection with SARS-CoV-2.  Clinical  correlation with patient history and other diagnostic information is  necessary to determine patient infection status.  Positive results do  not rule out bacterial infection or co-infection with other viruses. If result is PRESUMPTIVE POSTIVE SARS-CoV-2 nucleic acids MAY BE PRESENT.   A presumptive positive result was obtained on the submitted specimen  and confirmed on repeat testing.  While 2019 novel coronavirus  (SARS-CoV-2) nucleic acids may be present in the submitted sample  additional confirmatory testing may be necessary for epidemiological  and / or clinical management purposes  to differentiate between  SARS-CoV-2 and other Sarbecovirus currently known to infect humans.  If clinically indicated additional testing with an alternate test  methodology [phone]) is advised. The SARS-CoV-2 RNA is generally  detectable in upper and lower respiratory sp ecimens during the acute  phase of infection. The expected result is Negative. Fact Sheet for Patients:  BoilerBrush.com.cy Fact Sheet for Healthcare Providers: https://pope.com/ This test is not yet approved or cleared by the [place] FDA and has been authorized for detection and/or diagnosis of SARS-CoV-2 by FDA under an Emergency Use Authorization (EUA).  This EUA will remain in effect (meaning this test can be used) for the duration of the COVID-19 declaration under Section 564(b)(1) of the Act, 21 U.S.C. section  360bbb-3(b)(1), unless the authorization is terminated or revoked sooner. Performed at [facility], [address] [place], [place] [address]   MRSA PCR Screening     Status: Abnormal   Collection Time: 08-31-19 12:11 PM   Specimen: Nasal Mucosa; Nasopharyngeal  Result Value Ref Range Status   MRSA by PCR POSITIVE (A) NEGATIVE Final    Comment:        The GeneXpert MRSA Assay (FDA approved for NASAL specimens only), is one component of a comprehensive MRSA colonization surveillance program. It is not intended to diagnose MRSA infection nor to guide or monitor treatment for MRSA infections. RESULT CALLED TO, READ BACK BY AND VERIFIED WITH: CHARLIE WAITLEY 2019/08/31 1340 KLW Performed at [facility], [address] [place] [place], [place] [address]           IMAGING    Dg Chest Port 1 View  Result Date: 08/07/2019 CLINICAL DATA:  Acute respiratory failure. EXAM: PORTABLE CHEST 1 VIEW COMPARISON:  Radiograph 08-31-2019. CT 06/24/2019 FINDINGS: Endotracheal tube tip 5.2 cm from the carina. Enteric tube in place with tip below the diaphragm, not included in the field of view. Right upper extremity PICC tip in the lower SVC. Advanced emphysema with patchy bibasilar opacities, likely scarring. No  pneumothorax. Blunting of the costophrenic angles, likely due to chronic hyperinflation rather than pleural effusions. IMPRESSION: 1. Advanced emphysema with patchy bibasilar opacities, favor scarring. 2. Support apparatus unchanged. Electronically Signed   By: Keith Rake M.D.   On: 08/07/2019 06:52   [place] Ekg Site Rite  Result Date: 08/06/2019 If Site Rite image not attached, placement could not be confirmed due to current cardiac rhythm.      Indwelling Urinary Catheter continued, requirement due to   Reason to continue Indwelling Urinary Catheter strict Intake/Output monitoring for hemodynamic instability   Central Line/ continued, requirement due to  Reason  to continue [place] of central venous pressure or other hemodynamic parameters and poor IV access   Ventilator continued, requirement due to severe respiratory failure   Ventilator Sedation RASS 0 to -2      ASSESSMENT AND PLAN SYNOPSIS   Severe Acute Hypoxic and Hypercapnic Respiratory Failure due to severe COPD exacerbation, complicated by acute diastolic CHF probable underlying pneumonia -continue Full MV support -continue Bronchodilator Therapy -Wean Fio2 and PEEP as tolerated -unable to  perform SAT/SBT  -empiric antibiotics - azithromycin and rocephin   Septic and Hypovolemic Shock  -use vasopressors to keep MAP>65 - currently on 22 mcg neo, .03 vasopressin  -follow ABG and LA -follow up cultures - cultures have no growth after 2 days  -emperic ABX   Severe COPD Exacerbation  -continue IV steroids as prescribed -continue NEB THERAPY as prescribed -morphine as needed -wean fio2 as needed and tolerated   Diastolic CHF -oxygen as needed -Lasix as tolerated -BNP trending down (164 today)  AKI -follow chem 7 -follow UO -continue Foley Catheter-assess need -Avoid nephrotoxic agents -Creatinine improving (102 today)   Neurology  - intubated and sedated on propofol and fentanyl - wean sedation  - start xanax - pt takes at home - minimal sedation to achieve a RASS goal: -1 - responsive to verbal stimuli, able to follow commands   Cardiac ICU monitoring  ID -continue IV abx as prescibed -follow up cultures -WBC trending upward (15 today) - monitor    GI -GI PROPHYLAXIS as indicated -Bowel regimen   Nutritional Status DIET-->TF's as tolerated Constipation protocol as indicated  Endo - will use ICU hypoglycemic\Hyperglycemia protocol if indicated   Electrolytes -follow labs as needed -replace as needed -pharmacy consultation and following   DVT/GI PRX ordered TRANSFUSIONS AS NEEDED MONITOR FSBS ASSESS the need  for LABS as needed   Critical Care Time devoted to patient care services described in this note is 35 minutes.   Overall, patient is critically ill, prognosis is guarded.  Patient with Multiorgan failure and at high risk for cardiac arrest and death.   I anticipate prolonged ICU LOS and Vent support due to severe end stage COPD and upper lobe emphysema.  Corrin Parker, M.D.  [facility]  Medical Director [place] Director [facility] Cardio-Pulmonary Department

## 2019-08-07 NOTE — Consult Note (Signed)
ANTICOAGULATION CONSULT NOTE   Pharmacy Consult for Heparin Dosing Indication: venous thromboembolism  Patient Measurements: Height: 5\' 10"  (177.8 cm) Weight: 224 lb 13.9 oz (102 kg) IBW/kg (Calculated) : 73 Heparin Dosing Weight: 93.5 kg  Vital Signs: Temp: 99.1 F (37.3 C) (11/18 2300) Temp Source: Bladder (11/18 1600) BP: 103/61 (11/18 2300) Pulse Rate: 70 (11/18 2300)  Labs: Recent Labs    September 03, 2019 0021 2019-09-03 0417 2019-09-03 0634 2019-09-03 0941 03-Sep-2019 1422  08/06/19 0532 08/06/19 0808 08/06/19 1447 08/06/19 2320  HGB 12.9*  --   --   --   --   --  11.8*  --   --   --   HCT 42.2  --   --   --   --   --  37.4*  --   --   --   PLT 182  --   --   --   --   --  224  --   --   --   APTT  --   --   --   --   --    < > >160* 105* 62* 90*  CREATININE 1.13  --   --   --  1.26*  --  1.29*  --   --   --   TROPONINIHS 14 13 13 12   --   --   --   --   --   --    < > = values in this interval not displayed.    Estimated Creatinine Clearance: 68.3 mL/min (A) (by C-G formula based on SCr of 1.29 mg/dL (H)).  Assessment: Dustin [name] is a 4 YOM admitted to the ICU on 09-03-2019 with respiratory failure requiring intubation. 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 Pharmacy has been consulted for heparin dosing.  Patient received dose of apixaban 5 mg PO @ 6578 yesterday (11/17).  11/18 @ 0808 aPTT 105 seconds, warranting reduction in drip rate.  Goal of Therapy:  - aPTT 66-102 seconds - Monitor platelets by anticoagulation protocol: Yes.   Plan:  11/18 @ 2230 aPTT 90 seconds therapeutic. Will continue current rate and recheck aPTT w/ am labs, CBC has dropped from baseline but is stable, will continue to monitor.  Thank you for allowing pharmacy to be a part of this patient's care.  Tobie Lords, PharmD, BCPS Clinical Pharmacist 08/07/2019,12:13 AM

## 2019-08-07 NOTE — Consult Note (Signed)
ANTICOAGULATION CONSULT NOTE   Pharmacy Consult for Heparin Dosing Indication: venous thromboembolism  Patient Measurements: Height: 5\' 10"  (177.8 cm) Weight: 231 lb 7.7 oz (105 kg) IBW/kg (Calculated) : 73 Heparin Dosing Weight: 93.5 kg  Vital Signs: Temp: 99 F (37.2 C) (11/19 0430) BP: 100/58 (11/19 0430) Pulse Rate: 74 (11/19 0430)  Labs: Recent Labs    09/04/19 0021 09/04/2019 0417 September 04, 2019 0634 09/04/2019 0941 2019-09-04 1422  08/06/19 0532  08/06/19 1447 08/06/19 2320 08/07/19 0413  HGB 12.9*  --   --   --   --   --  11.8*  --   --   --  11.3*  HCT 42.2  --   --   --   --   --  37.4*  --   --   --  35.1*  PLT 182  --   --   --   --   --  224  --   --   --  214  APTT  --   --   --   --   --    < > >160*   < > 62* 90* 78*  CREATININE 1.13  --   --   --  1.26*  --  1.29*  --   --   --  1.02  TROPONINIHS 14 13 13 12   --   --   --   --   --   --   --    < > = values in this interval not displayed.    Estimated Creatinine Clearance: 87.6 mL/min (by C-G formula based on SCr of 1.02 mg/dL).  Assessment: Dustin [name]. Dustin [name] is a 31 YOM admitted to the ICU on 2019-09-04 with respiratory failure requiring intubation. 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 Pharmacy has been consulted for heparin dosing.  Patient received dose of apixaban 5 mg PO @ 9675 yesterday (11/17).  11/18 @ 0808 aPTT 105 seconds, warranting reduction in drip rate.  Goal of Therapy:  - aPTT 66-102 seconds - Monitor platelets by anticoagulation protocol: Yes.   Plan:  11/19 @ 0500 aPTT 78 seconds therapeutic. Will continue current rate and will recheck aPTT/HL w/ am labs, CBC stable will continue to monitor.  Tobie Lords, PharmD, BCPS Clinical Pharmacist 08/07/2019,5:39 AM

## 2019-08-07 NOTE — Progress Notes (Signed)
Pharmacy Electrolyte Monitoring Consult:  Dustin [name] is a 68 YOM admitted to the ICU on 08/18/2019 with respiratory failure requiring intubation from COPD, CHF, and possible PNA. 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  Patient is receiving Solu-Medrol 40 mg IV BID. Furosemide was discontinued on admission secondary to vasopressor requirements. Patient is on tube feeds (VITAL HIGH PROTEIN 1,000 mL @ 20 mL/hr and PRO-STAT 60 mL QID).   Labs:  Sodium (mmol/L)  Date Value  08/07/2019 139  04/01/2019 145 (H)  07/23/2014 139   Potassium (mmol/L)  Date Value  08/07/2019 4.4  07/23/2014 4.1   Magnesium (mg/dL)  Date Value  08/07/2019 2.3   Phosphorus (mg/dL)  Date Value  08/07/2019 3.3   Calcium (mg/dL)  Date Value  08/07/2019 8.7 (L)   Calcium, Total (mg/dL)  Date Value  07/23/2014 8.4 (L)   Albumin (g/dL)  Date Value  08/06/2019 3.1 (L)  07/23/2014 2.8 (L)   Corrected Ca: 9.4 mg/dL   Assessment/Plan:  Electrolytes:  - No electrolyte replacement warranted at this time. - Will check BMP with AM labs.   Goals of Therapy: - K ~ 4 - Mg ~ 2  - All other electrolytes within normal limits.   Glucose:  - Blood glucose readings ranging 127-141 over past 24 hours.  - Patient is on 0-9 units q4h SSI. - Will continue to monitor and consult with diabetes team as necessary.  Constipation:  - Patient receiving Senokot-S 2 tablets VT BID (LBM: NTD, admit 11/17). - Will add MiraLAX 17g VT daily. - He is on fentanyl 300 mcg/hr for pain management. - Will continue to monitor and adjust as needs arise.  Thank you for allowing pharmacy to be a part of this patient's care.  Dustin [name], PharmD Candidate 08/07/2019 11:06 AM

## 2019-08-08 ENCOUNTER — Telehealth: Payer: Self-pay | Admitting: Cardiovascular Disease

## 2019-08-08 LAB — CBC
HCT: 34.2 % — ABNORMAL LOW (ref 39.0–52.0)
Hemoglobin: 10.9 g/dL — ABNORMAL LOW (ref 13.0–17.0)
MCH: 32 pg (ref 26.0–34.0)
MCHC: 31.9 g/dL (ref 30.0–36.0)
MCV: 100.3 fL — ABNORMAL HIGH (ref 80.0–100.0)
Platelets: 172 10*3/uL (ref 150–400)
RBC: 3.41 MIL/uL — ABNORMAL LOW (ref 4.22–5.81)
RDW: 13.2 % (ref 11.5–15.5)
WBC: 6.7 10*3/uL (ref 4.0–10.5)
nRBC: 0 % (ref 0.0–0.2)

## 2019-08-08 LAB — BASIC METABOLIC PANEL
Anion gap: 9 (ref 5–15)
BUN: 31 mg/dL — ABNORMAL HIGH (ref 8–23)
CO2: 31 mmol/L (ref 22–32)
Calcium: 8.7 mg/dL — ABNORMAL LOW (ref 8.9–10.3)
Chloride: 99 mmol/L (ref 98–111)
Creatinine, Ser: 0.78 mg/dL (ref 0.61–1.24)
GFR calc Af Amer: >60 mL/min (ref >60)
GFR calc non Af Amer: >60 mL/min (ref >60)
Glucose, Bld: 163 mg/dL — ABNORMAL HIGH (ref 70–99)
Potassium: 4.7 mmol/L (ref 3.5–5.1)
Sodium: 139 mmol/L (ref 135–145)

## 2019-08-08 LAB — HEPARIN LEVEL (UNFRACTIONATED): Heparin Unfractionated: 1.19 IU/mL — ABNORMAL HIGH (ref 0.30–0.70)

## 2019-08-08 LAB — GLUCOSE, CAPILLARY
Glucose-Capillary: 150 mg/dL — ABNORMAL HIGH (ref 70–99)
Glucose-Capillary: 159 mg/dL — ABNORMAL HIGH (ref 70–99)
Glucose-Capillary: 113 mg/dL — ABNORMAL HIGH (ref 70–99)
Glucose-Capillary: 145 mg/dL — ABNORMAL HIGH (ref 70–99)
Glucose-Capillary: 129 mg/dL — ABNORMAL HIGH (ref 70–99)
Glucose-Capillary: 119 mg/dL — ABNORMAL HIGH (ref 70–99)

## 2019-08-08 LAB — TRIGLYCERIDES: Triglycerides: 81 mg/dL (ref <150)

## 2019-08-08 LAB — APTT: aPTT: 83 seconds — ABNORMAL HIGH (ref 24–36)

## 2019-08-08 NOTE — Progress Notes (Signed)
Pharmacy Electrolyte Monitoring Consult:  Dustin [name] is a 13 YOM admitted to the ICU on 23-Aug-2019 with respiratory failure requiring intubation from COPD, CHF, and possible PNA. 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  Patient is receiving Solu-Medrol 40 mg IV BID. Furosemide was discontinued on admission secondary to vasopressor requirements. Patient is on tube feeds (VITAL HIGH PROTEIN 1,000 mL @ 20 mL/hr and PRO-STAT 60 mL QID).   Labs:  Sodium (mmol/L)  Date Value  08/08/2019 139  04/01/2019 145 (H)  07/23/2014 139   Potassium (mmol/L)  Date Value  08/08/2019 4.7  07/23/2014 4.1   Magnesium (mg/dL)  Date Value  08/07/2019 2.3   Phosphorus (mg/dL)  Date Value  08/07/2019 3.3   Calcium (mg/dL)  Date Value  08/08/2019 8.7 (L)   Calcium, Total (mg/dL)  Date Value  07/23/2014 8.4 (L)   Albumin (g/dL)  Date Value  08/06/2019 3.1 (L)  07/23/2014 2.8 (L)   Corrected Ca: 9.4 mg/dL   Assessment/Plan:  Electrolytes:  - No electrolyte replacement warranted at this time. - No supplementation over past few days, will defer BMP to 11/22 and adjust as necessary..  Goals of Therapy: - K ~ 4 - Mg ~ 2  - All other electrolytes within normal limits.   Glucose:  - Blood glucose readings ranging 134-174 over past 24 hours.  - Patient is on 0-9 units q4h SSI. - Will continue to monitor and consult with diabetes team as necessary.  Constipation:  - Patient receiving Senokot-S 2 tablets VT BID (LBM: NTD, admit 11/17). - Continue MiraLAX 17g VT daily. - He is on fentanyl 300 mcg/hr for pain management. - Will continue to monitor and adjust as needs arise.  Thank you for allowing pharmacy to be a part of this patient's care.  Raiford Simmonds, PharmD Candidate 08/08/2019 10:55 AM

## 2019-08-08 NOTE — Telephone Encounter (Signed)
Spoke with patients daughter per release form. He is currently admitted and she is wanting to speak with Dr. Rockey Situ regarding his current admission. Advised that I would send provider a message and she also wanted to know if he could call her once seen. Advised that he may update his nurse and she could review but that is something I am not sure about. She verbalized understanding and was appreciative for the call.

## 2019-08-08 NOTE — Consult Note (Signed)
ANTICOAGULATION CONSULT NOTE   Pharmacy Consult for Heparin Dosing Indication: venous thromboembolism  Patient Measurements: Height: 5\' 10"  (177.8 cm) Weight: 233 lb 4 oz (105.8 kg) IBW/kg (Calculated) : 73 Heparin Dosing Weight: 93.5 kg  Vital Signs: Temp: 97.5 F (36.4 C) (11/20 0445) Temp Source: Bladder (11/20 0400) BP: 116/63 (11/20 0445) Pulse Rate: 56 (11/20 0445)  Labs: Recent Labs    08/12/2019 0941  08/06/19 0532  08/06/19 2320 08/07/19 0413 08/08/19 0445  HGB  --    < > 11.8*  --   --  11.3* 10.9*  HCT  --   --  37.4*  --   --  35.1* 34.2*  PLT  --   --  224  --   --  214 172  APTT  --    < > >160*   < > 90* 78* 83*  HEPARINUNFRC  --   --   --   --   --   --  1.19*  CREATININE  --    < > 1.29*  --   --  1.02 0.78  TROPONINIHS 12  --   --   --   --   --   --    < > = values in this interval not displayed.    Estimated Creatinine Clearance: 112.1 mL/min (by C-G formula based on SCr of 0.78 mg/dL).  Assessment: Dustin [name]. Weissberg is a 66 YOM admitted to the ICU on 07/20/2019 with respiratory failure requiring intubation. 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 Pharmacy has been consulted for heparin dosing.  Patient received dose of apixaban 5 mg PO @ 6712 11/17.   Goal of Therapy:  - aPTT 66-102 seconds - Monitor platelets by anticoagulation protocol: Yes.   Plan:  11/20 0445 aPTT 83 therapeutic. Will continue current rate and will recheck aPTT/HL w/ am labs, CBC stable will continue to monitor.  Dorena Bodo, PharmD Clinical Pharmacist 08/08/2019,7:43 AM

## 2019-08-08 NOTE — Progress Notes (Addendum)
CRITICAL CARE NOTE  CC  follow up respiratory failuredue to COPD exacerbation  SUBJECTIVE Patient remains critically ill Prognosis is guarded Remains on vent at FiO2 50% On 3 mcg neo, .03 units vaso  Sedated on 25 propofol, 300 fentanyl    BP (!) 118/58   Pulse 65   Temp (!) 97 F (36.1 C)   Resp 16   Ht 5\' 10"  (1.778 m)   Wt 105.8 kg   SpO2 (!) 88%   BMI 33.47 kg/m    I/O last 3 completed shifts: In: 2750.7 [I.V.:2660.7; IV Piggyback:90] Out: 2297 [Urine:1340] No intake/output data recorded.  SpO2: (!) 88 % O2 Flow Rate (L/min): 3.5 L/min FiO2 (%): 50 %   SIGNIFICANT EVENTS 11/17: Intubated. Started on neo 11/18: PICC line placed. Remains intubated, FiO2 50%. On neo at 40 mcg. Sedated on 15 mcg propofol and 200 mcg fentanyl. Responds to verbal stimuli and follows commands. 11/19: Remains intubated and sedated. FiO2 55%. On neo at 22, vasopressin .03. Sedated with 25 propofol and 300 fentanyl. Unresponsive to verbal stimuli.  11/20: Pt confused and delirious when sedated weaned. Remains intubated and sedated - FiO2 50%, 3 mcg neo, .03 units vasopressin. Sedated on 20 mcg propofol, 300 mcg fentanyl.   REVIEW OF SYSTEMS  PATIENT IS UNABLE TO PROVIDE COMPLETE REVIEW OF SYSTEMS DUE TO SEVERE CRITICAL ILLNESS   PHYSICAL EXAMINATION:  GENERAL:critically ill appearing, intubated, sedated  HEAD: Normocephalic, atraumatic.  EYES: Pupils equal, round, reactive to light.  No scleral icterus.  MOUTH: ETT and OGT in place. Tan/pink secretions.  NECK: Supple.  PULMONARY: Distant breath sounds bilaterally. CARDIOVASCULAR: S1 and S2. Regular rate and rhythm. No murmurs, rubs, or gallops.  GASTROINTESTINAL: Soft, nontender, -distended. No masses. Positive bowel sounds. No hepatosplenomegaly.  MUSCULOSKELETAL: No swelling, clubbing, or edema.  NEUROLOGIC: obtunded, GCS<8 SKIN:intact,warm,dry  MEDICATIONS: I have reviewed all medications and confirmed regimen as  documented   CULTURE RESULTS   Recent Results (from the past 240 hour(s))  Culture, blood (routine x 2)     Status: None (Preliminary result)   Collection Time: 08/15/2019  4:35 AM   Specimen: BLOOD  Result Value Ref Range Status   Specimen Description BLOOD RIGHT WRIST   Final   Special Requests   Final    BOTTLES DRAWN AEROBIC AND ANAEROBIC Blood Culture results may not be optimal due to an excessive volume of blood received in culture bottles   Culture   Final    NO GROWTH 3 DAYS Performed at [facility], [address] [place], [place] [address]    Report Status PENDING  Incomplete  Culture, blood (routine x 2)     Status: None (Preliminary result)   Collection Time: 07/20/2019  4:35 AM   Specimen: BLOOD  Result Value Ref Range Status   Specimen Description BLOOD LEFT HAND  Final   Special Requests   Final    BOTTLES DRAWN AEROBIC AND ANAEROBIC Blood Culture results may not be optimal due to an excessive volume of blood received in culture bottles   Culture   Final    NO GROWTH 3 DAYS Performed at [facility], [address] [place], [place] [address]    Report Status PENDING  Incomplete  SARS Coronavirus 2 by RT PCR (hospital order, performed in [facility] hospital lab) Nasopharyngeal Nasopharyngeal Swab     Status: None   Collection Time: 07/23/2019  9:41 AM   Specimen: Nasopharyngeal Swab  Result Value Ref Range Status  SARS Coronavirus 2 NEGATIVE NEGATIVE Final    Comment: (NOTE) If result is NEGATIVE SARS-CoV-2 target nucleic acids are NOT DETECTED. The SARS-CoV-2 RNA is generally detectable in upper and lower  respiratory specimens during the acute phase of infection. The lowest  concentration of SARS-CoV-2 viral copies this assay can detect is 250  copies / mL. A negative result does not preclude SARS-CoV-2 infection  and should not be used as the sole basis for treatment or other  patient management decisions.  A negative result may occur  with  improper specimen collection / handling, submission of specimen other  than nasopharyngeal swab, presence of viral mutation(s) within the  areas targeted by this assay, and inadequate number of viral copies  (<250 copies / mL). A negative result must be combined with clinical  observations, patient history, and epidemiological information. If result is POSITIVE SARS-CoV-2 target nucleic acids are DETECTED. The SARS-CoV-2 RNA is generally detectable in upper and lower  respiratory specimens dur ing the acute phase of infection.  Positive  results are indicative of active infection with SARS-CoV-2.  Clinical  correlation with patient history and other diagnostic information is  necessary to determine patient infection status.  Positive results do  not rule out bacterial infection or co-infection with other viruses. If result is PRESUMPTIVE POSTIVE SARS-CoV-2 nucleic acids MAY BE PRESENT.   A presumptive positive result was obtained on the submitted specimen  and confirmed on repeat testing.  While 2019 novel coronavirus  (SARS-CoV-2) nucleic acids may be present in the submitted sample  additional confirmatory testing may be necessary for epidemiological  and / or clinical management purposes  to differentiate between  SARS-CoV-2 and other Sarbecovirus currently known to infect humans.  If clinically indicated additional testing with an alternate test  methodology [phone]([id number]) is advised. The SARS-CoV-2 RNA is generally  detectable in upper and lower respiratory sp ecimens during the acute  phase of infection. The expected result is Negative. Fact Sheet for Patients:  BoilerBrush.com.cyhttps://www.fda.gov/media/136312/download Fact Sheet for Healthcare Providers: https://pope.com/https://www.fda.gov/media/136313/download This test is not yet approved or cleared by the [place] FDA and has been authorized for detection and/or diagnosis of SARS-CoV-2 by FDA under an Emergency Use Authorization (EUA).  This EUA  will remain in effect (meaning this test can be used) for the duration of the COVID-19 declaration under Section 564(b)(1) of the Act, 21 U.S.C. section 360bbb-3(b)(1), unless the authorization is terminated or revoked sooner. Performed at [facility], [address] [place], [name] [address]   MRSA PCR Screening     Status: Abnormal   Collection Time: Dec 05, 2018 12:11 PM   Specimen: Nasal Mucosa; Nasopharyngeal  Result Value Ref Range Status   MRSA by PCR POSITIVE (A) NEGATIVE Final    Comment:        The GeneXpert MRSA Assay (FDA approved for NASAL specimens only), is one component of a comprehensive MRSA colonization surveillance program. It is not intended to diagnose MRSA infection nor to guide or monitor treatment for MRSA infections. RESULT CALLED TO, READ BACK BY AND VERIFIED WITH: CHARLIE WAITLEY Dec 05, 2018 1340 KLW Performed at [facility], [address] [place], [name] [address]            Indwelling Urinary Catheter continued, requirement due to   Reason to continue Indwelling Urinary Catheter strict Intake/Output monitoring for hemodynamic instability   Central Line/ continued, requirement due to  Reason to continue [employer] Line Monitoring of central venous pressure or other hemodynamic parameters and poor  IV access   Ventilator continued, requirement due to severe respiratory failure   Ventilator Sedation RASS 0 to -2      ASSESSMENT AND PLAN SYNOPSIS   Severe AcuteHypoxic and Hypercapnic Respiratory Failuredue to severe COPD exacerbation, complicated byacute diastolicCHFprobable underlying pneumonia -continue Full MV support -continue Bronchodilator Therapy -Wean Fio2 and PEEP as tolerated -unable to  perform SAT/SBT - pt is confused, unable to protect airway  -empiric antibiotics - azithromycin and rocephin. Last doses tomorrow.   Septic and Hypovolemic Shock -slowly improving, coming down on pressor doses  -use  vasopressors to keep MAP>65- currently on 3 mcg neo, .03 vasopressin  -follow ABG and LA -follow up cultures- cultures have no growth after 3 days -emperic ABX   Severe COPD Exacerbation  -continue IV steroids as prescribed -continue NEB THERAPY as prescribed -morphine as needed -wean fio2 as needed and tolerated   DiastolicCHF -oxygen as needed -Lasix as tolerated  AKI -improving -follow chem 7 -follow UO (865 mL) -continue Foley Catheter-assess need -Avoid nephrotoxic agents -Creatinine improving (.78 today)   Neurology  - intubated and sedatedon propofol and fentanyl - on anxiolytics: xanax and lexapro  - minimal sedation to achieve a RASS goal: -1 - unresponsive to verbal stimuli d/t sedation   Heme -On 1300 units of heparin today -APTT 83 today, heparin level 1.19 -Hbg stable (10.9),Platelets stable (172) -Pharmacy consulted, continue to monitor levels  Cardiac ICU monitoring  ID -continue IV abx as prescibed -follow up cultures -WBC WNL (6.7 today)  GI -GI PROPHYLAXIS as indicated -Bowel regimen   Nutritional Status DIET-->TF's as tolerated Constipation protocol as indicated  Endo - will use ICU hypoglycemic\Hyperglycemia protocol if indicated   Electrolytes -follow labs as needed -replace as needed -pharmacy consultation and following   DVT/GI PRX ordered TRANSFUSIONS AS NEEDED MONITOR FSBS ASSESS the need for LABS as needed   Critical Care Time devoted to patient care services described in this note is 31 minutes.   Overall, patient is critically ill, prognosis is guarded.  Patient with Multiorgan failure and at high risk for cardiac arrest and death. Consult palliative care to discuss patient goals.    Lucie Leather, M.D.  [facility]  Medical Director [facility] Medical Director [facility] Cardio-Pulmonary Department

## 2019-08-08 NOTE — Telephone Encounter (Signed)
Patient sister Dustin [name] calling Patient is currently in hospital and there has been a new development found with ECHO that she would like to discuss Dustin [name] requesting to speak with Dr Rockey Situ only but I informed a nurse may have to call  Please contact Dustin [name] at [phone]

## 2019-08-09 ENCOUNTER — Inpatient Hospital Stay: Payer: Medicare Other

## 2019-08-09 LAB — CBC
HCT: 34.1 % — ABNORMAL LOW (ref 39.0–52.0)
Hemoglobin: 10.5 g/dL — ABNORMAL LOW (ref 13.0–17.0)
MCH: 31.7 pg (ref 26.0–34.0)
MCHC: 30.8 g/dL (ref 30.0–36.0)
MCV: 103 fL — ABNORMAL HIGH (ref 80.0–100.0)
Platelets: 164 10*3/uL (ref 150–400)
RBC: 3.31 MIL/uL — ABNORMAL LOW (ref 4.22–5.81)
RDW: 13.3 % (ref 11.5–15.5)
WBC: 11.4 10*3/uL — ABNORMAL HIGH (ref 4.0–10.5)
nRBC: 0 % (ref 0.0–0.2)

## 2019-08-09 LAB — APTT
aPTT: 65 seconds — ABNORMAL HIGH (ref 24–36)
aPTT: >160 seconds (ref 24–36)
aPTT: 57 seconds — ABNORMAL HIGH (ref 24–36)
aPTT: 61 seconds — ABNORMAL HIGH (ref 24–36)

## 2019-08-09 LAB — GLUCOSE, CAPILLARY
Glucose-Capillary: 109 mg/dL — ABNORMAL HIGH (ref 70–99)
Glucose-Capillary: 111 mg/dL — ABNORMAL HIGH (ref 70–99)
Glucose-Capillary: 120 mg/dL — ABNORMAL HIGH (ref 70–99)
Glucose-Capillary: 124 mg/dL — ABNORMAL HIGH (ref 70–99)
Glucose-Capillary: 129 mg/dL — ABNORMAL HIGH (ref 70–99)
Glucose-Capillary: 87 mg/dL (ref 70–99)

## 2019-08-09 LAB — HEPARIN LEVEL (UNFRACTIONATED): Heparin Unfractionated: 0.95 IU/mL — ABNORMAL HIGH (ref 0.30–0.70)

## 2019-08-09 MED ORDER — BISACODYL 10 MG RE SUPP
10.0000 mg | Freq: Once | RECTAL | Status: AC
  Administered 2019-08-09: 10 mg via RECTAL
  Filled 2019-08-09: qty 1

## 2019-08-09 MED ORDER — STERILE WATER FOR INJECTION IJ SOLN
INTRAMUSCULAR | Status: AC
  Administered 2019-08-09: 10 mL
  Filled 2019-08-09: qty 10

## 2019-08-09 MED ORDER — VECURONIUM BROMIDE 10 MG IV SOLR
10.0000 mg | Freq: Once | INTRAVENOUS | Status: AC
  Administered 2019-08-09: 11:00:00 10 mg via INTRAVENOUS

## 2019-08-09 MED ORDER — VECURONIUM BROMIDE 10 MG IV SOLR
INTRAVENOUS | Status: AC
  Administered 2019-08-09: 10 mg via INTRAVENOUS
  Filled 2019-08-09: qty 10

## 2019-08-09 NOTE — Consult Note (Signed)
ANTICOAGULATION CONSULT NOTE   Pharmacy Consult for Heparin Dosing Indication: venous thromboembolism  Patient Measurements: Height: 5\' 10"  (177.8 cm) Weight: 236 lb 15.9 oz (107.5 kg) IBW/kg (Calculated) : 73 Heparin Dosing Weight: 93.5 kg  Vital Signs: Temp: 96.3 F (35.7 C) (11/21 2100) Temp Source: Rectal (11/21 1939) BP: 107/57 (11/21 2100) Pulse Rate: 69 (11/21 2100)  Labs: Recent Labs    08/07/19 0413 08/08/19 0445 08/09/19 0434 08/09/19 1100 08/09/19 1302 08/09/19 2041  HGB 11.3* 10.9* 10.5*  --   --   --   HCT 35.1* 34.2* 34.1*  --   --   --   PLT 214 172 164  --   --   --   APTT 78* 83* 65* >160* 57* 61*  HEPARINUNFRC  --  1.19* 0.95*  --   --   --   CREATININE 1.02 0.78  --   --   --   --     Estimated Creatinine Clearance: 113 mL/min (by C-G formula based on SCr of 0.78 mg/dL).  Assessment: Dustin [name] is a 89 YOM admitted to the ICU on 08/12/2019 with respiratory failure requiring intubation. 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 Pharmacy has been consulted for heparin dosing.  Patient received dose of apixaban 5 mg PO @ 9833 11/17.   Goal of Therapy:  - aPTT 66-102 seconds - Monitor platelets by anticoagulation protocol: Yes.   Plan:  11/21 @ 2100 aPTT 61 seconds subtherapeutic. Will increase rate to 1450 units/hr and will recheck aPTT/HL w/ am labs, CBC trending down will continue to monitor.  Dustin [name], PharmD, BCPS Clinical Pharmacist 08/09/2019,10:26 PM

## 2019-08-09 NOTE — Progress Notes (Signed)
CRITICAL CARE NOTE  CC  Follow up COPD exacerbation and resp failure  SUBJECTIVE Remains critically ill On pressors On vent fio2 at 50% Severe hypoxia    BP 129/73   Pulse 71   Temp 99.9 F (37.7 C)   Resp 18   Ht 5\' 10"  (1.778 m)   Wt 107.5 kg   SpO2 92%   BMI 34.01 kg/m    I/O last 3 completed shifts: In: 2544.8 [I.V.:2444.8; IV Piggyback:100] Out: 1335 [Urine:1335] No intake/output data recorded.  SpO2: 92 % O2 Flow Rate (L/min): 3.5 L/min FiO2 (%): 50 %   SIGNIFICANT EVENTS 11/17: Intubated. Started on neo 11/18: PICC line placed. Remains intubated, FiO2 50%. On neo at 40 mcg. Sedated on 15 mcg propofol and 200 mcg fentanyl. Responds to verbal stimuli and follows commands. 11/19: Remains intubated and sedated. FiO2 55%. On neo at 22, vasopressin .03. Sedated with 25 propofol and 300 fentanyl. Unresponsive to verbal stimuli.  11/20: Pt confused and delirious when sedated weaned. Remains intubated and sedated - FiO2 50%, 3 mcg neo, .03 units vasopressin. Sedated on 20 mcg propofol, 300 mcg fentanyl.  11/21 remains on vent, failed SAT/SBT   REVIEW OF SYSTEMS  PATIENT IS UNABLE TO PROVIDE COMPLETE REVIEW OF SYSTEM S DUE TO SEVERE CRITICAL ILLNESS AND ENCEPHALOPATHy  PHYSICAL EXAMINATION:  GENERAL:critically ill appearing, +resp distress HEAD: Normocephalic, atraumatic.  EYES: Pupils equal, round, reactive to light.  No scleral icterus.  MOUTH: Moist mucosal membrane. NECK: Supple. No thyromegaly. No nodules. No JVD.  PULMONARY: +rhonchi, +wheezing CARDIOVASCULAR: S1 and S2. Regular rate and rhythm. No murmurs, rubs, or gallops.  GASTROINTESTINAL: Soft, nontender, -distended. Positive bowel sounds.  MUSCULOSKELETAL: No swelling, clubbing, or edema.  NEUROLOGIC: obtunded SKIN:intact,warm,dry     CULTURE RESULTS   Recent Results (from the past 240 hour(s))  Culture, blood (routine x 2)     Status: None (Preliminary result)   Collection Time: 08/10/2019   4:35 AM   Specimen: BLOOD  Result Value Ref Range Status   Specimen Description BLOOD RIGHT WRIST   Final   Special Requests   Final    BOTTLES DRAWN AEROBIC AND ANAEROBIC Blood Culture results may not be optimal due to an excessive volume of blood received in culture bottles   Culture   Final    NO GROWTH 4 DAYS Performed at [facility], [address], [place], [place]    Report Status PENDING  Incomplete  Culture, blood (routine x 2)     Status: None (Preliminary result)   Collection Time: 08/07/2019  4:35 AM   Specimen: BLOOD  Result Value Ref Range Status   Specimen Description BLOOD LEFT HAND  Final   Special Requests   Final    BOTTLES DRAWN AEROBIC AND ANAEROBIC Blood Culture results may not be optimal due to an excessive volume of blood received in culture bottles   Culture   Final    NO GROWTH 4 DAYS Performed at [facility], [address], [place], [place]    Report Status PENDING  Incomplete  SARS Coronavirus 2 by RT PCR (hospital order, performed in [facility] hospital lab) Nasopharyngeal Nasopharyngeal Swab     Status: None   Collection Time: 07/25/2019  9:41 AM   Specimen: Nasopharyngeal Swab  Result Value Ref Range Status   SARS Coronavirus 2 NEGATIVE NEGATIVE Final    Comment: (NOTE) If result is NEGATIVE SARS-CoV-2 target nucleic acids are NOT DETECTED. The SARS-CoV-2 RNA is generally  detectable in upper and lower  respiratory specimens during the acute phase of infection. The lowest  concentration of SARS-CoV-2 viral copies this assay can detect is 250  copies / mL. A negative result does not preclude SARS-CoV-2 infection  and should not be used as the sole basis for treatment or other  patient management decisions.  A negative result may occur with  improper specimen collection / handling, submission of specimen other  than nasopharyngeal swab, presence of viral mutation(s) within the  areas targeted by this assay,  and inadequate number of viral copies  (<250 copies / mL). A negative result must be combined with clinical  observations, patient history, and epidemiological information. If result is POSITIVE SARS-CoV-2 target nucleic acids are DETECTED. The SARS-CoV-2 RNA is generally detectable in upper and lower  respiratory specimens dur ing the acute phase of infection.  Positive  results are indicative of active infection with SARS-CoV-2.  Clinical  correlation with patient history and other diagnostic information is  necessary to determine patient infection status.  Positive results do  not rule out bacterial infection or co-infection with other viruses. If result is PRESUMPTIVE POSTIVE SARS-CoV-2 nucleic acids MAY BE PRESENT.   A presumptive positive result was obtained on the submitted specimen  and confirmed on repeat testing.  While 2019 novel coronavirus  (SARS-CoV-2) nucleic acids may be present in the submitted sample  additional confirmatory testing may be necessary for epidemiological  and / or clinical management purposes  to differentiate between  SARS-CoV-2 and other Sarbecovirus currently known to infect humans.  If clinically indicated additional testing with an alternate test  methodology [phone]) is advised. The SARS-CoV-2 RNA is generally  detectable in upper and lower respiratory sp ecimens during the acute  phase of infection. The expected result is Negative. Fact Sheet for Patients:  BoilerBrush.com.cyhttps://www.fda.gov/media/136312/download Fact Sheet for Healthcare Providers: https://pope.com/https://www.fda.gov/media/136313/download This test is not yet approved or cleared by the [place] FDA and has been authorized for detection and/or diagnosis of SARS-CoV-2 by FDA under an Emergency Use Authorization (EUA).  This EUA will remain in effect (meaning this test can be used) for the duration of the COVID-19 declaration under Section 564(b)(1) of the Act, 21 U.S.C. section 360bbb-3(b)(1), unless  the authorization is terminated or revoked sooner. Performed at [facility], [address] [place], [name] [address]   MRSA PCR Screening     Status: Abnormal   Collection Time: 08/01/2019 12:11 PM   Specimen: Nasal Mucosa; Nasopharyngeal  Result Value Ref Range Status   MRSA by PCR POSITIVE (A) NEGATIVE Final    Comment:        The GeneXpert MRSA Assay (FDA approved for NASAL specimens only), is one component of a comprehensive MRSA colonization surveillance program. It is not intended to diagnose MRSA infection nor to guide or monitor treatment for MRSA infections. RESULT CALLED TO, READ BACK BY AND VERIFIED WITH: CHARLIE WAITLEY 08/04/2019 1340 KLW Performed at [facility], [address] [place], [name] [address]             Indwelling Urinary Catheter continued, requirement due to   Reason to continue Indwelling Urinary Catheter strict Intake/Output monitoring for hemodynamic instability   Central Line/ continued, requirement due to  Reason to continue [employer] Line Monitoring of central venous pressure or other hemodynamic parameters and poor IV access   Ventilator continued, requirement due to severe respiratory failure   Ventilator Sedation RASS 0 to -2  ASSESSMENT AND PLAN SYNOPSIS  63 yo WM with end stage COPD admitted for acute and severe COPD exacerbation with severe resp failure from acute diastolic dysfunction and pneumonia   Severe ACUTE Hypoxic and Hypercapnic Respiratory Failure -continue Mechanical Ventilator support -continue Bronchodilator Therapy -Wean Fio2 and PEEP as tolerated -VAP/VENT bundle implementation Unable to perform SAT/SBT  Septic/cardiogenic shock -use vasopressors to keep MAP>65 -follow ABG and LA -follow up cultures -emperic ABX -consider stress dose steroids  SEVERE COPD EXACERBATION -continue IV steroids as prescribed -continue NEB THERAPY as prescribed   ACUTE DIASTOLIC CARDIAC  FAILURE- -oxygen as needed -Lasix as tolerated -follow up cardiac enzymes as indicated   ACUTE KIDNEY INJURY/Renal Failure -follow chem 7 -follow UO -continue Foley Catheter-assess need -Avoid nephrotoxic agents      NEUROLOGY - intubated and sedated - minimal sedation to achieve a RASS goal: -1   PE/DVT Continue Heparin infusion  ID Continue IV abx for pneumonia   GI GI PROPHYLAXIS as indicated  NUTRITIONAL STATUS DIET-->TF's as tolerated Constipation protocol as indicated  ENDO - ICU hypoglycemic\Hyperglycemia protocol -check FSBS per protocol    DVT/GI PRX ordered TRANSFUSIONS AS NEEDED MONITOR FSBS ASSESS the need for LABS as needed  ELECTROLYTES -follow labs as needed -replace as needed -pharmacy consultation and following    Critical Care Time devoted to patient care services described in this note is 35 minutes.   Overall, patient is critically ill, prognosis is guarded.  Patient with Multiorgan failure and at high risk for cardiac arrest and death.    Corrin Parker, M.D.  [facility]  Medical Director [place] Director [facility] Cardio-Pulmonary Department

## 2019-08-09 NOTE — Progress Notes (Signed)
Family At bedside, clinical status relayed to family  Updated and notified of patients medical condition-  Progressive multiorgan failure with very low chance of meaningful recovery.   Family understands the situation.  They have consented and agreed to DNR.  The plan is to continue medical management and to continue vent support. Plan for one way extubation when time comes   Family are satisfied with Plan of action and management. All questions answered  Corrin Parker, M.D.  [facility]  Medical Director [place] Director [facility] Cardio-Pulmonary Department

## 2019-08-09 NOTE — Consult Note (Signed)
ANTICOAGULATION CONSULT NOTE   Pharmacy Consult for Heparin Dosing Indication: venous thromboembolism  Patient Measurements: Height: 5\' 10"  (177.8 cm) Weight: 236 lb 15.9 oz (107.5 kg) IBW/kg (Calculated) : 73 Heparin Dosing Weight: 93.5 kg  Vital Signs: Temp: 98.2 F (36.8 C) (11/21 1400) Temp Source: Bladder (11/21 1145) BP: 98/55 (11/21 1400) Pulse Rate: 70 (11/21 1400)  Labs: Recent Labs    08/07/19 0413 08/08/19 0445 08/09/19 0434 08/09/19 1100 08/09/19 1302  HGB 11.3* 10.9* 10.5*  --   --   HCT 35.1* 34.2* 34.1*  --   --   PLT 214 172 164  --   --   APTT 78* 83* 65* >160* 57*  HEPARINUNFRC  --  1.19* 0.95*  --   --   CREATININE 1.02 0.78  --   --   --     Estimated Creatinine Clearance: 113 mL/min (by C-G formula based on SCr of 0.78 mg/dL).  Assessment: Dustin [name]. Dustin [name] is a 34 YOM admitted to the ICU on 08-30-19 with respiratory failure requiring intubation. 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 Pharmacy has been consulted for heparin dosing.  Patient received dose of apixaban 5 mg PO @ 6144 11/17.   Goal of Therapy:  - aPTT 66-102 seconds - Monitor platelets by anticoagulation protocol: Yes.   Plan:  APTT elevated at > 160, heparin drip paused and follow up aPTT obtained via peripheral stick. Follow up aPTT was 57. Heparin paused for ~ 75 minutes. Heparin resumed at previous rate of 1350 units/hr and aPTT scheduled for 2030.   Pharmacy will continue to monitor and adjust per consult.   MLS 08/09/2019,3:25 PM

## 2019-08-09 NOTE — Consult Note (Signed)
ANTICOAGULATION CONSULT NOTE   Pharmacy Consult for Heparin Dosing Indication: venous thromboembolism  Patient Measurements: Height: 5\' 10"  (177.8 cm) Weight: 236 lb 15.9 oz (107.5 kg) IBW/kg (Calculated) : 73 Heparin Dosing Weight: 93.5 kg  Vital Signs: Temp: 100.2 F (37.9 C) (11/21 0500) Temp Source: Bladder (11/21 0400) BP: 124/70 (11/21 0500) Pulse Rate: 73 (11/21 0500)  Labs: Recent Labs    08/06/19 0532  08/07/19 0413 08/08/19 0445 08/09/19 0434  HGB 11.8*  --  11.3* 10.9* 10.5*  HCT 37.4*  --  35.1* 34.2* 34.1*  PLT 224  --  214 172 164  APTT >160*   < > 78* 83* 65*  HEPARINUNFRC  --   --   --  1.19* 0.95*  CREATININE 1.29*  --  1.02 0.78  --    < > = values in this interval not displayed.    Estimated Creatinine Clearance: 113 mL/min (by C-G formula based on SCr of 0.78 mg/dL).  Assessment: Vangie Bicker. Pile is a 52 YOM admitted to the ICU on 07/24/2019 with respiratory failure requiring intubation. 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 Pharmacy has been consulted for heparin dosing.  Patient received dose of apixaban 5 mg PO @ 0347 11/17.   Goal of Therapy:  - aPTT 66-102 seconds - Monitor platelets by anticoagulation protocol: Yes.   Plan:  11/21 0434 aPTT 65 barely subtherapeutic. Will increase rate slightly to 1350 unit/hr and recheck aPTT at 1100, CBC continue to decline, will continue to monitor.  Tobie Lords, PharmD, BCPS Clinical Pharmacist 08/09/2019,5:19 AM

## 2019-08-09 NOTE — Progress Notes (Signed)
Pharmacy Electrolyte Monitoring Consult:  Olusegun Gerstenberger. Bloom is a 65 YOM admitted to the ICU on 08/06/2019 with respiratory failure requiring intubation from COPD, CHF, and possible PNA. 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  Patient is receiving methylprednisolone 40 mg IV BID. Furosemide was discontinued on admission secondary to vasopressor requirements. Patient is on tube feeds (VITAL HIGH PROTEIN 1,000 mL at 20 mL/hr and PRO-STAT 60 mL QID).   Labs:  Sodium (mmol/L)  Date Value  08/08/2019 139  04/01/2019 145 (H)  07/23/2014 139   Potassium (mmol/L)  Date Value  08/08/2019 4.7  07/23/2014 4.1   Magnesium (mg/dL)  Date Value  08/07/2019 2.3   Phosphorus (mg/dL)  Date Value  08/07/2019 3.3   Calcium (mg/dL)  Date Value  08/08/2019 8.7 (L)   Calcium, Total (mg/dL)  Date Value  07/23/2014 8.4 (L)   Albumin (g/dL)  Date Value  08/06/2019 3.1 (L)  07/23/2014 2.8 (L)   Corrected Ca: 9.4 mg/dL   Assessment/Plan:  Electrolytes:  - No electrolyte replacement warranted at this time. - BMP with am labs on 11/22.  Goals of Therapy: - Potassium ~ 4 - Magnesium ~ 2  - All other electrolytes within normal limits.   Glucose:  - Patient requiring 4 units over past 24 hours.  - Patient is on 0-9 units q4h SSI. - Will continue to monitor and consult with diabetes team as necessary.  Constipation:  - No documented bowel movement since admission.  - Patient receiving Senokot-S 2 tablets VT BID and MiraLAX 17g VT daily. - Will order bisacodyl PR x 1.  - He is on fentanyl 300 mcg/hr for pain management. - Will continue to monitor and adjust as needs arise.  Pharmacy will continue to monitor and adjust per consult.   [name],[name] 08/09/2019 9:52 AM

## 2019-08-10 LAB — APTT
aPTT: 91 seconds — ABNORMAL HIGH (ref 24–36)
aPTT: >160 seconds (ref 24–36)
aPTT: 61 seconds — ABNORMAL HIGH (ref 24–36)
aPTT: 73 seconds — ABNORMAL HIGH (ref 24–36)

## 2019-08-10 LAB — CULTURE, BLOOD (ROUTINE X 2)
Culture: NO GROWTH
Culture: NO GROWTH

## 2019-08-10 LAB — GLUCOSE, CAPILLARY
Glucose-Capillary: 118 mg/dL — ABNORMAL HIGH (ref 70–99)
Glucose-Capillary: 103 mg/dL — ABNORMAL HIGH (ref 70–99)
Glucose-Capillary: 103 mg/dL — ABNORMAL HIGH (ref 70–99)
Glucose-Capillary: 102 mg/dL — ABNORMAL HIGH (ref 70–99)
Glucose-Capillary: 105 mg/dL — ABNORMAL HIGH (ref 70–99)

## 2019-08-10 LAB — BASIC METABOLIC PANEL
Anion gap: 8 (ref 5–15)
BUN: 41 mg/dL — ABNORMAL HIGH (ref 8–23)
CO2: 32 mmol/L (ref 22–32)
Calcium: 9 mg/dL (ref 8.9–10.3)
Chloride: 104 mmol/L (ref 98–111)
Creatinine, Ser: 0.7 mg/dL (ref 0.61–1.24)
GFR calc Af Amer: >60 mL/min (ref >60)
GFR calc non Af Amer: >60 mL/min (ref >60)
Glucose, Bld: 124 mg/dL — ABNORMAL HIGH (ref 70–99)
Potassium: 4.7 mmol/L (ref 3.5–5.1)
Sodium: 144 mmol/L (ref 135–145)

## 2019-08-10 LAB — CBC
HCT: 35.1 % — ABNORMAL LOW (ref 39.0–52.0)
Hemoglobin: 10.9 g/dL — ABNORMAL LOW (ref 13.0–17.0)
MCH: 32 pg (ref 26.0–34.0)
MCHC: 31.1 g/dL (ref 30.0–36.0)
MCV: 102.9 fL — ABNORMAL HIGH (ref 80.0–100.0)
Platelets: 171 10*3/uL (ref 150–400)
RBC: 3.41 MIL/uL — ABNORMAL LOW (ref 4.22–5.81)
RDW: 13.7 % (ref 11.5–15.5)
WBC: 10.3 10*3/uL (ref 4.0–10.5)
nRBC: 0 % (ref 0.0–0.2)

## 2019-08-10 LAB — HEPARIN LEVEL (UNFRACTIONATED): Heparin Unfractionated: 0.88 IU/mL — ABNORMAL HIGH (ref 0.30–0.70)

## 2019-08-10 LAB — PHOSPHORUS: Phosphorus: 4.5 mg/dL (ref 2.5–4.6)

## 2019-08-10 LAB — MAGNESIUM: Magnesium: 2.4 mg/dL (ref 1.7–2.4)

## 2019-08-10 MED ORDER — VECURONIUM BROMIDE 10 MG IV SOLR
10.0000 mg | Freq: Once | INTRAVENOUS | Status: AC
  Administered 2019-08-10: 13:00:00 10 mg via INTRAVENOUS
  Filled 2019-08-10: qty 10

## 2019-08-10 MED ORDER — FREE WATER
100.0000 mL | Status: DC
  Administered 2019-08-10 – 2019-08-15 (×31): 100 mL

## 2019-08-10 MED ORDER — BISACODYL 10 MG RE SUPP
10.0000 mg | Freq: Once | RECTAL | Status: AC
  Administered 2019-08-10: 10 mg via RECTAL
  Filled 2019-08-10: qty 1

## 2019-08-10 NOTE — Progress Notes (Signed)
Pharmacy Electrolyte Monitoring Consult:  Dustin [name] is a 72 YOM admitted to the ICU on 08/25/2019 with respiratory failure requiring intubation from COPD, CHF, and possible PNA. 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  Patient is receiving methylprednisolone 40 mg IV BID. Furosemide was discontinued on admission secondary to vasopressor requirements. Patient is on tube feeds (VITAL HIGH PROTEIN 1,000 mL at 20 mL/hr and PRO-STAT 60 mL QID).   Labs:  Sodium (mmol/L)  Date Value  08/10/2019 144  04/01/2019 145 (H)  07/23/2014 139   Potassium (mmol/L)  Date Value  08/10/2019 4.7  07/23/2014 4.1   Magnesium (mg/dL)  Date Value  08/10/2019 2.4   Phosphorus (mg/dL)  Date Value  08/10/2019 4.5   Calcium (mg/dL)  Date Value  08/10/2019 9.0   Calcium, Total (mg/dL)  Date Value  07/23/2014 8.4 (L)   Albumin (g/dL)  Date Value  08/06/2019 3.1 (L)  07/23/2014 2.8 (L)    Assessment/Plan:  Electrolytes:  -Sodium trending up. Initiate free water flushes at 124mL Q4hr.  - Obtain BMP with am labs.   Goals of Therapy: - Potassium ~ 4 - Magnesium ~ 2  - All other electrolytes within normal limits.   Glucose:  - Patient requiring 0 units over past 24 hours.  - Patient is on 0-9 units q4h SSI. - Will continue to monitor and consult with diabetes team as necessary.  Constipation:  - No documented bowel movement since admission.  - Patient receiving Senokot-S 2 tablets VT BID and MiraLAX 17g VT daily. - Bisacodyl PR x 1 ordered on 11/22. Will repeat. Will consider lactulose.  - He is on fentanyl 300 mcg/hr for pain management.  Pharmacy will continue to monitor and adjust per consult.   [name],[name] 08/10/2019 8:02 AM

## 2019-08-10 NOTE — Consult Note (Signed)
ANTICOAGULATION CONSULT NOTE   Pharmacy Consult for Heparin Dosing Indication: venous thromboembolism  Patient Measurements: Height: 5\' 10"  (177.8 cm) Weight: 240 lb 4.8 oz (109 kg) IBW/kg (Calculated) : 73 Heparin Dosing Weight: 93.5 kg  Vital Signs: Temp: 98.1 F (36.7 C) (11/22 2100) Temp Source: Bladder (11/22 2000) BP: 94/54 (11/22 2100) Pulse Rate: 70 (11/22 2100)  Labs: Recent Labs    08/08/19 0445 08/09/19 0434  08/10/19 0416 08/10/19 1102 08/10/19 1247 08/10/19 2009  HGB 10.9* 10.5*  --  10.9*  --   --   --   HCT 34.2* 34.1*  --  35.1*  --   --   --   PLT 172 164  --  171  --   --   --   APTT 83* 65*   < > 91* >160* 61* 73*  HEPARINUNFRC 1.19* 0.95*  --  0.88*  --   --   --   CREATININE 0.78  --   --  0.70  --   --   --    < > = values in this interval not displayed.    Estimated Creatinine Clearance: 113.8 mL/min (by C-G formula based on SCr of 0.7 mg/dL).  Assessment: Dustin [name] is a 44 YOM admitted to the ICU on 2019-08-14 with respiratory failure requiring intubation. 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 Pharmacy has been consulted for heparin dosing.  Patient received dose of apixaban 5 mg PO @ 2426 11/17.   Goal of Therapy:  - aPTT 66-102 seconds - Monitor platelets by anticoagulation protocol: Yes.   Plan:  11/22 @ 2009 aPTT: 73. Therapeutic x1.  Will continue heparin drip at 1450 units/hr. Will recheck aPTT in 6 hours and HL/CBC with AM labs.   Will continue to monitor with aPTTs until aPTTs and heparin levels correlate.   Recommend obtaining aPTTs and heparin levels via phlebotomist.   Pharmacy will continue to monitor and adjust per consult.   Kristeen Miss, PharmD 08/10/2019,9:31 PM

## 2019-08-10 NOTE — Consult Note (Signed)
ANTICOAGULATION CONSULT NOTE   Pharmacy Consult for Heparin Dosing Indication: venous thromboembolism  Patient Measurements: Height: 5\' 10"  (177.8 cm) Weight: 240 lb 4.8 oz (109 kg) IBW/kg (Calculated) : 73 Heparin Dosing Weight: 93.5 kg  Vital Signs: Temp: 98.8 F (37.1 C) (11/22 0700) BP: 96/53 (11/22 0700) Pulse Rate: 66 (11/22 0700)  Labs: Recent Labs    08/08/19 0445 08/09/19 0434  08/10/19 0416 08/10/19 1102 08/10/19 1247  HGB 10.9* 10.5*  --  10.9*  --   --   HCT 34.2* 34.1*  --  35.1*  --   --   PLT 172 164  --  171  --   --   APTT 83* 65*   < > 91* >160* 61*  HEPARINUNFRC 1.19* 0.95*  --  0.88*  --   --   CREATININE 0.78  --   --  0.70  --   --    < > = values in this interval not displayed.    Estimated Creatinine Clearance: 113.8 mL/min (by C-G formula based on SCr of 0.7 mg/dL).  Assessment: Vangie Bicker. Dissinger is a 72 YOM admitted to the ICU on 07/31/2019 with respiratory failure requiring intubation. 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 Pharmacy has been consulted for heparin dosing.  Patient received dose of apixaban 5 mg PO @ 1761 11/17.   Goal of Therapy:  - aPTT 66-102 seconds - Monitor platelets by anticoagulation protocol: Yes.   Plan:  Intial aPTT > 160. Recheck is at 55. Will resume heparin drip at 1450 units/hr. Will obtain follow up aPTT at 2030. CBC/HL with am labs.   Will continue to monitor with aPTTs until aPTTs and heparin levels correlate.   Recommend obtaining aPTTs and heparin levels via phlebotomist.   Pharmacy will continue to monitor and adjust per consult.   MLS 08/10/2019,2:26 PM

## 2019-08-10 NOTE — Consult Note (Signed)
ANTICOAGULATION CONSULT NOTE   Pharmacy Consult for Heparin Dosing Indication: venous thromboembolism  Patient Measurements: Height: 5\' 10"  (177.8 cm) Weight: 240 lb 4.8 oz (109 kg) IBW/kg (Calculated) : 73 Heparin Dosing Weight: 93.5 kg  Vital Signs: Temp: 98.4 F (36.9 C) (11/22 0500) Temp Source: Bladder (11/21 2000) BP: 102/58 (11/22 0500) Pulse Rate: 86 (11/22 0500)  Labs: Recent Labs    08/08/19 0445 08/09/19 0434  08/09/19 1302 08/09/19 2041 08/10/19 0416  HGB 10.9* 10.5*  --   --   --  10.9*  HCT 34.2* 34.1*  --   --   --  35.1*  PLT 172 164  --   --   --  171  APTT 83* 65*   < > 57* 61* 91*  HEPARINUNFRC 1.19* 0.95*  --   --   --  0.88*  CREATININE 0.78  --   --   --   --  0.70   < > = values in this interval not displayed.    Estimated Creatinine Clearance: 113.8 mL/min (by C-G formula based on SCr of 0.7 mg/dL).  Assessment: Dustin [name] is a 50 YOM admitted to the ICU on 07/27/2019 with respiratory failure requiring intubation. 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 Pharmacy has been consulted for heparin dosing.  Patient received dose of apixaban 5 mg PO @ 9833 11/17.   Goal of Therapy:  - aPTT 66-102 seconds - Monitor platelets by anticoagulation protocol: Yes.   Plan:  11/22 @ 0400 aPTT 91 seconds therapeutic. Will continue rate at 1450 units/hr and will recheck aPTT at 1000 HL w/ am labs, CBC currently stable will continue to monitor.  Tobie Lords, PharmD, BCPS Clinical Pharmacist 08/10/2019,5:52 AM

## 2019-08-10 NOTE — Progress Notes (Signed)
CRITICAL CARE NOTE  CC  Follow up resp failure Severe COPD exacerbation  SUBJECTIVE Remains critically ill On pressors fio2 at 55% Severe hypoxia Failed weaning trials last several days    BP (!) 96/53   Pulse 66   Temp 98.8 F (37.1 C)   Resp 18   Ht 5\' 10"  (1.778 m)   Wt 109 kg   SpO2 90%   BMI 34.48 kg/m    I/O last 3 completed shifts: In: 5112.7 [I.V.:3412.7; NG/GT:1500; IV Piggyback:200] Out: 4380 [Urine:4380] No intake/output data recorded.  SpO2: 90 % O2 Flow Rate (L/min): 3.5 L/min FiO2 (%): 50 %   SIGNIFICANT EVENTS 11/17: Intubated. Started on neo 11/18: PICC line placed. Remains intubated, FiO2 50%. On neo at 40 mcg. Sedated on 15 mcg propofol and 200 mcg fentanyl. Responds to verbal stimuli and follows commands. 11/19: Remains intubated and sedated. FiO2 55%. On neo at 22, vasopressin .03. Sedated with 25 propofol and 300 fentanyl. Unresponsive to verbal stimuli.  11/20: Pt confused and delirious when sedated weaned. Remains intubated and sedated - FiO2 50%, 3 mcg neo, .03 units vasopressin. Sedated on 20 mcg propofol, 300 mcg fentanyl.  11/21 remains on vent, failed SAT/SBT   REVIEW OF SYSTEMS  PATIENT IS UNABLE TO PROVIDE COMPLETE REVIEW OF SYSTEM S DUE TO SEVERE CRITICAL ILLNESS AND ENCEPHALOPATHy  PHYSICAL EXAMINATION:  PHYSICAL EXAMINATION:  GENERAL:critically ill appearing, +resp distress HEAD: Normocephalic, atraumatic.  EYES: Pupils equal, round, reactive to light.  No scleral icterus.  MOUTH: Moist mucosal membrane. NECK: Supple. No thyromegaly. No nodules. No JVD.  PULMONARY: +rhonchi, +wheezing CARDIOVASCULAR: S1 and S2. Regular rate and rhythm. No murmurs, rubs, or gallops.  GASTROINTESTINAL: Soft, nontender, -distended. Positive bowel sounds.  MUSCULOSKELETAL: No swelling, clubbing, or edema.  NEUROLOGIC: obtunded SKIN:intact,warm,dry       CULTURE RESULTS   Recent Results (from the past 240 hour(s))  Culture, blood  (routine x 2)     Status: None   Collection Time: 09/09/19  4:35 AM   Specimen: BLOOD  Result Value Ref Range Status   Specimen Description BLOOD RIGHT WRIST   Final   Special Requests   Final    BOTTLES DRAWN AEROBIC AND ANAEROBIC Blood Culture results may not be optimal due to an excessive volume of blood received in culture bottles   Culture   Final    NO GROWTH 5 DAYS Performed at [facility], [address] [place], [name] [address]    Report Status 08/10/2019 FINAL  Final  Culture, blood (routine x 2)     Status: None   Collection Time: 09/09/19  4:35 AM   Specimen: BLOOD  Result Value Ref Range Status   Specimen Description BLOOD LEFT HAND  Final   Special Requests   Final    BOTTLES DRAWN AEROBIC AND ANAEROBIC Blood Culture results may not be optimal due to an excessive volume of blood received in culture bottles   Culture   Final    NO GROWTH 5 DAYS Performed at [facility], [address] [place], [name] [address]    Report Status 08/10/2019 FINAL  Final  SARS Coronavirus 2 by RT PCR (hospital order, performed in [facility] hospital lab) Nasopharyngeal Nasopharyngeal Swab     Status: None   Collection Time: 09/09/19  9:41 AM   Specimen: Nasopharyngeal Swab  Result Value Ref Range Status   SARS Coronavirus 2 NEGATIVE NEGATIVE Final    Comment: (NOTE) If result is NEGATIVE SARS-CoV-2 target nucleic  acids are NOT DETECTED. The SARS-CoV-2 RNA is generally detectable in upper and lower  respiratory specimens during the acute phase of infection. The lowest  concentration of SARS-CoV-2 viral copies this assay can detect is 250  copies / mL. A negative result does not preclude SARS-CoV-2 infection  and should not be used as the sole basis for treatment or other  patient management decisions.  A negative result may occur with  improper specimen collection / handling, submission of specimen other  than nasopharyngeal swab, presence of viral  mutation(s) within the  areas targeted by this assay, and inadequate number of viral copies  (<250 copies / mL). A negative result must be combined with clinical  observations, patient history, and epidemiological information. If result is POSITIVE SARS-CoV-2 target nucleic acids are DETECTED. The SARS-CoV-2 RNA is generally detectable in upper and lower  respiratory specimens dur ing the acute phase of infection.  Positive  results are indicative of active infection with SARS-CoV-2.  Clinical  correlation with patient history and other diagnostic information is  necessary to determine patient infection status.  Positive results do  not rule out bacterial infection or co-infection with other viruses. If result is PRESUMPTIVE POSTIVE SARS-CoV-2 nucleic acids MAY BE PRESENT.   A presumptive positive result was obtained on the submitted specimen  and confirmed on repeat testing.  While 2019 novel coronavirus  (SARS-CoV-2) nucleic acids may be present in the submitted sample  additional confirmatory testing may be necessary for epidemiological  and / or clinical management purposes  to differentiate between  SARS-CoV-2 and other Sarbecovirus currently known to infect humans.  If clinically indicated additional testing with an alternate test  methodology [phone]) is advised. The SARS-CoV-2 RNA is generally  detectable in upper and lower respiratory sp ecimens during the acute  phase of infection. The expected result is Negative. Fact Sheet for Patients:  StrictlyIdeas.no Fact Sheet for Healthcare Providers: BankingDealers.co.za This test is not yet approved or cleared by the [place] FDA and has been authorized for detection and/or diagnosis of SARS-CoV-2 by FDA under an Emergency Use Authorization (EUA).  This EUA will remain in effect (meaning this test can be used) for the duration of the COVID-19 declaration under Section 564(b)(1)  of the Act, 21 U.S.C. section 360bbb-3(b)(1), unless the authorization is terminated or revoked sooner. Performed at [facility], [address] [place], [place] [address]   MRSA PCR Screening     Status: Abnormal   Collection Time: 08/04/2019 12:11 PM   Specimen: Nasal Mucosa; Nasopharyngeal  Result Value Ref Range Status   MRSA by PCR POSITIVE (A) NEGATIVE Final    Comment:        The GeneXpert MRSA Assay (FDA approved for NASAL specimens only), is one component of a comprehensive MRSA colonization surveillance program. It is not intended to diagnose MRSA infection nor to guide or monitor treatment for MRSA infections. RESULT CALLED TO, READ BACK BY AND VERIFIED WITH: CHARLIE WAITLEY 07/23/2019 1340 KLW Performed at [facility], [address] [place], [place] [address]             Indwelling Urinary Catheter continued, requirement due to   Reason to continue Indwelling Urinary Catheter strict Intake/Output monitoring for hemodynamic instability   Central Line/ continued, requirement due to  Reason to continue [place] of central venous pressure or other hemodynamic parameters and poor IV access   Ventilator continued, requirement due to severe respiratory failure   Ventilator Sedation RASS  0 to -2       ASSESSMENT AND PLAN SYNOPSIS  63 yo WM with end stage COPD admitted for acute and severe COPD exacerbation with severe resp failure from acute diastolic dysfunction and pneumonia  Severe ACUTE Hypoxic and Hypercapnic Respiratory Failure -continue Mechanical Ventilator support -continue Bronchodilator Therapy -Wean Fio2 and PEEP as tolerated -VAP/VENT bundle implementation  Septic shock -use vasopressors to keep MAP>65 -follow ABG and LA -follow up cultures -emperic ABX -consider stress dose steroids   SEVERE COPD EXACERBATION -continue IV steroids as prescribed -continue NEB THERAPY as prescribed   ACUTE  DIASTOLIC CARDIAC FAILURE-  -oxygen as needed -Lasix as tolerated  ACUTE KIDNEY INJURY/Renal Failure -follow chem 7 -follow UO -continue Foley Catheter-assess need -Avoid nephrotoxic agents   NEUROLOGY - intubated and sedated - minimal sedation to achieve a RASS goal: -1   PE/DVT Continue heparin infusion   ID Continue IV abx for pneumonia   GI GI PROPHYLAXIS as indicated  NUTRITIONAL STATUS DIET-->TF's as tolerated Constipation protocol as indicated   ENDO - ICU hypoglycemic\Hyperglycemia protocol -check FSBS per protocol      DVT/GI PRX ordered TRANSFUSIONS AS NEEDED MONITOR FSBS ASSESS the need for LABS as needed  ELECTROLYTES -follow labs as needed -replace as needed -pharmacy consultation and following     Critical Care Time devoted to patient care services described in this note is 32 minutes.   Overall, patient is critically ill, prognosis is guarded.  Patient with Multiorgan failure and at high risk for cardiac arrest and death.    Lucie Leather, M.D.  [facility]  Medical Director [facility] Medical Director [facility] Cardio-Pulmonary Department

## 2019-08-11 ENCOUNTER — Inpatient Hospital Stay: Payer: Medicare Other

## 2019-08-11 ENCOUNTER — Telehealth: Payer: Medicare Other

## 2019-08-11 DIAGNOSIS — I82403 Acute embolism and thrombosis of unspecified deep veins of lower extremity, bilateral: Secondary | ICD-10-CM

## 2019-08-11 LAB — BASIC METABOLIC PANEL
Anion gap: 6 (ref 5–15)
BUN: 35 mg/dL — ABNORMAL HIGH (ref 8–23)
CO2: 32 mmol/L (ref 22–32)
Calcium: 8.5 mg/dL — ABNORMAL LOW (ref 8.9–10.3)
Chloride: 107 mmol/L (ref 98–111)
Creatinine, Ser: 0.69 mg/dL (ref 0.61–1.24)
GFR calc Af Amer: >60 mL/min (ref >60)
GFR calc non Af Amer: >60 mL/min (ref >60)
Glucose, Bld: 110 mg/dL — ABNORMAL HIGH (ref 70–99)
Potassium: 4.6 mmol/L (ref 3.5–5.1)
Sodium: 145 mmol/L (ref 135–145)

## 2019-08-11 LAB — CBC
HCT: 35 % — ABNORMAL LOW (ref 39.0–52.0)
Hemoglobin: 11 g/dL — ABNORMAL LOW (ref 13.0–17.0)
MCH: 32.9 pg (ref 26.0–34.0)
MCHC: 31.4 g/dL (ref 30.0–36.0)
MCV: 104.8 fL — ABNORMAL HIGH (ref 80.0–100.0)
Platelets: 173 10*3/uL (ref 150–400)
RBC: 3.34 MIL/uL — ABNORMAL LOW (ref 4.22–5.81)
RDW: 14 % (ref 11.5–15.5)
WBC: 14.3 10*3/uL — ABNORMAL HIGH (ref 4.0–10.5)
nRBC: 0 % (ref 0.0–0.2)

## 2019-08-11 LAB — TRIGLYCERIDES: Triglycerides: 602 mg/dL — ABNORMAL HIGH (ref <150)

## 2019-08-11 LAB — BLOOD GAS, ARTERIAL
Acid-Base Excess: 9.7 mmol/L — ABNORMAL HIGH (ref 0.0–2.0)
Acid-Base Excess: 11.9 mmol/L — ABNORMAL HIGH (ref 0.0–2.0)
Bicarbonate: 38.1 mmol/L — ABNORMAL HIGH (ref 20.0–28.0)
Bicarbonate: 38.2 mmol/L — ABNORMAL HIGH (ref 20.0–28.0)
FIO2: 0.7
FIO2: 0.8
MECHVT: 500 mL
MECHVT: 500 mL
Mechanical Rate: 10
Mechanical Rate: 18
O2 Saturation: 92 %
O2 Saturation: 93.9 %
PEEP: 5 cmH2O
Patient temperature: 37
Patient temperature: 37
RATE: 10 resp/min
RATE: 18 resp/min
pCO2 arterial: 69 mmHg (ref 32.0–48.0)
pCO2 arterial: 55 mmHg — ABNORMAL HIGH (ref 32.0–48.0)
pH, Arterial: 7.35 (ref 7.350–7.450)
pH, Arterial: 7.45 (ref 7.350–7.450)
pO2, Arterial: 67 mmHg — ABNORMAL LOW (ref 83.0–108.0)
pO2, Arterial: 67 mmHg — ABNORMAL LOW (ref 83.0–108.0)

## 2019-08-11 LAB — GLUCOSE, CAPILLARY
Glucose-Capillary: 107 mg/dL — ABNORMAL HIGH (ref 70–99)
Glucose-Capillary: 110 mg/dL — ABNORMAL HIGH (ref 70–99)
Glucose-Capillary: 111 mg/dL — ABNORMAL HIGH (ref 70–99)
Glucose-Capillary: 113 mg/dL — ABNORMAL HIGH (ref 70–99)
Glucose-Capillary: 118 mg/dL — ABNORMAL HIGH (ref 70–99)
Glucose-Capillary: 119 mg/dL — ABNORMAL HIGH (ref 70–99)
Glucose-Capillary: 120 mg/dL — ABNORMAL HIGH (ref 70–99)
Glucose-Capillary: 27 mg/dL — CL (ref 70–99)

## 2019-08-11 LAB — APTT: aPTT: 66 seconds — ABNORMAL HIGH (ref 24–36)

## 2019-08-11 LAB — HEPARIN LEVEL (UNFRACTIONATED): Heparin Unfractionated: 0.64 IU/mL (ref 0.30–0.70)

## 2019-08-11 MED ORDER — BISACODYL 10 MG RE SUPP
10.0000 mg | Freq: Once | RECTAL | Status: AC
  Administered 2019-08-11: 10 mg via RECTAL
  Filled 2019-08-11: qty 1

## 2019-08-11 MED ORDER — MIDAZOLAM 50MG/50ML (1MG/ML) PREMIX INFUSION
0.5000 mg/h | INTRAVENOUS | Status: DC
  Administered 2019-08-11: 0.5 mg/h via INTRAVENOUS
  Filled 2019-08-11: qty 50

## 2019-08-11 MED ORDER — MIDAZOLAM 50MG/50ML (1MG/ML) PREMIX INFUSION
2.0000 mg/h | INTRAVENOUS | Status: DC
  Administered 2019-08-12 – 2019-08-14 (×4): 2 mg/h via INTRAVENOUS
  Filled 2019-08-11 (×4): qty 50

## 2019-08-11 NOTE — Progress Notes (Signed)
Pharmacy Electrolyte Monitoring Consult:  Dustin [name] is a 1 YOM admitted to the ICU on 08-27-2019 with respiratory failure requiring intubation from COPD, CHF, and possible PNA. 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  Patient is receiving Solu-Medrol 40 mg IV BID. Furosemide was discontinued on admission secondary to vasopressor requirements. Patient is on tube feeds (VITAL HIGH PROTEIN 1,000 mL at 20 mL/hr and PRO-STAT 60 mL QID).   Labs:  Sodium (mmol/L)  Date Value  08/11/2019 145  04/01/2019 145 (H)  07/23/2014 139   Potassium (mmol/L)  Date Value  08/11/2019 4.6  07/23/2014 4.1   Magnesium (mg/dL)  Date Value  08/10/2019 2.4   Phosphorus (mg/dL)  Date Value  08/10/2019 4.5   Calcium (mg/dL)  Date Value  08/11/2019 8.5 (L)   Calcium, Total (mg/dL)  Date Value  07/23/2014 8.4 (L)   Albumin (g/dL)  Date Value  08/06/2019 3.1 (L)  07/23/2014 2.8 (L)    Corrected Ca: 9.2 mg/dL  Assessment/Plan:  Electrolytes:  -Continue free water flushes at 122mL q4h.  - No other electrolytes warranting replacement at this time. - Check BMP with AM labs and adjust as necessary.  Goals of Therapy: - Potassium ~ 4 - Magnesium ~ 2  - All other electrolytes within normal limits.   Glucose:  - Patient is on 0-9 units q4h SSI. - Patient requiring 0 units over past 24 hours (BG 103-120).  - Will continue to monitor and consult with diabetes team as necessary.  Constipation:  - No documented bowel movement since admission.  - Patient receiving Senokot-S 2 tablets VT BID and MiraLAX 17g VT daily. - Bisacodyl PR x 1 ordered on 11/23. Will repeat if needed. - He is on fentanyl 200 mcg/hr for pain management.  Pharmacy will continue to monitor and adjust per consult.   Dustin [name], PharmD Candidate 08/11/2019 10:44 AM

## 2019-08-11 NOTE — Progress Notes (Signed)
Nutrition Follow Up Note   DOCUMENTATION CODES:   Obesity unspecified  INTERVENTION:   Continue Vital High Protein at 20 mL/hr (480 mL goal daily volume) + Pro-Stat 60 mL QID per tube. Provides 1280 kcal, 162 grams of protein, 403 mL H2O daily. With current propofol rate provides 1845 kcal daily.  Provide liquid MVI daily per tube to meet 100% RDIs for vitamins/minerals.  Provide minimum free water flush of 30 mL Q4hrs.  NUTRITION DIAGNOSIS:   Inadequate oral intake related to inability to eat as evidenced by NPO status.  GOAL:   Provide needs based on ASPEN/SCCM guidelines  MONITOR:   Vent status, Labs, Weight trends, TF tolerance, I & O's  REASON FOR ASSESSMENT:   Ventilator    ASSESSMENT:   63 year old male with PMHx of asthma, chronic diastolic CHF, GERD, tobacco abuse, anxiety, depression, HTN, emphysema of lung, COPD on 2L [place] at baseline admitted with severe acute hypoxic and hypercapnic respiratory failure due to severe COPD exacerbation requiring intubation on 11/17, septic and hypovolemic shock, AKI.   Pt remains sedated and intubated. Pt tolerating tube feeds well. Per chart, pt up ~15lbs since admit. No BM noted; bowel regimen per MD. Triglycerides elevated. Pt failed weaning trials.   Medications reviewed and include: D3, pepcid, insulin, solu-medrol, MVI, Miralax, senokot, fentanyl, heparin, neo-synephrine, propofol  Labs reviewed: BUN 35(H) Triglycerides 602(H) Wbc- 14.3(H)  Patient is currently intubated on ventilator support MV: 11.4 L/min Temp (24hrs), Avg:98.5 F (36.9 C), Min:97.5 F (36.4 C), Max:100.4 F (38 C)  Propofol: 21.4 ml/hr- provides 565kcal/day   MAP- >76mmHg  UOP- 1450ml   Diet Order:   Diet Order    None     EDUCATION NEEDS:   No education needs have been identified at this time  Skin:  Skin Assessment: Reviewed RN Assessment  Last BM:  Unknown/PTA  Height:   Ht Readings from Last 1 Encounters:  09/02/2019 5\' 10"   (1.778 m)   Weight:   Wt Readings from Last 1 Encounters:  08/11/19 108.7 kg   Ideal Body Weight:  75.5 kg  BMI:  Body mass index is 34.38 kg/m.  Estimated Nutritional Needs:   Kcal:  1122-1428 (11-14 kcal/kg)  Protein:  151 grams (2 grams/kg IBW)  Fluid:  1.8 L/day  Koleen Distance MS, RD, LDN Pager #- [phone] Office#- [phone] After Hours Pager: [phone]

## 2019-08-11 NOTE — Consult Note (Addendum)
ANTICOAGULATION CONSULT NOTE   Pharmacy Consult for Heparin Dosing Indication: venous thromboembolism  Patient Measurements: Height: 5\' 10"  (177.8 cm) Weight: 240 lb 4.8 oz (109 kg) IBW/kg (Calculated) : 73 Heparin Dosing Weight: 93.5 kg  Vital Signs: Temp: 97.7 F (36.5 C) (11/23 0300) Temp Source: Bladder (11/23 0000) BP: 115/69 (11/23 0300) Pulse Rate: 80 (11/23 0300)  Labs: Recent Labs    08/08/19 0445 08/09/19 0434  08/10/19 0416  08/10/19 1247 08/10/19 2009 08/11/19 0227 08/11/19 0237  HGB 10.9* 10.5*  --  10.9*  --   --   --   --  11.0*  HCT 34.2* 34.1*  --  35.1*  --   --   --   --  35.0*  PLT 172 164  --  171  --   --   --   --  173  APTT 83* 65*   < > 91*   < > 61* 73* 66*  --   HEPARINUNFRC 1.19* 0.95*  --  0.88*  --   --   --   --   --   CREATININE 0.78  --   --  0.70  --   --   --  0.69  --    < > = values in this interval not displayed.    Estimated Creatinine Clearance: 113.8 mL/min (by C-G formula based on SCr of 0.69 mg/dL).  Assessment: Dustin [name]. Lofquist is a 60 YOM admitted to the ICU on 08/06/2019 with respiratory failure requiring intubation. 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 Pharmacy has been consulted for heparin dosing.  Patient received dose of apixaban 5 mg PO @ 4401 11/17.   Goal of Therapy:  - aPTT 66-102 seconds - Monitor platelets by anticoagulation protocol: Yes.   Plan:  11/22 @ 0229 aPTT 66 seconds therapeutic, HL 0.64 correlating but barely. Will continue current rate and will recheck aPTT/HL w/ am labs, CBC stable will continue to monitor.  Tobie Lords, PharmD, BCPS Clinical Pharmacist 08/11/2019,3:34 AM

## 2019-08-11 NOTE — Progress Notes (Signed)
CRITICAL CARE NOTE  CC  Follow up resp failure Severe COPD exacerbation  SUBJECTIVE Remains critically ill On pressors fio2 increased to 80% last night Severe hypoxia Failed weaning trials  Vent Mode: PRVC FiO2 (%):  [70 %-80 %] 80 % Set Rate:  [10 bmp-18 bmp] 18 bmp Vt Set:  [500 mL] 500 mL PEEP:  [5 cmH20] 5 cmH20 Plateau Pressure:  [15 cmH20] 15 cmH20   BP (!) 98/55   Pulse 60   Temp 97.7 F (36.5 C)   Resp 16   Ht 5\' 10"  (1.778 m)   Wt 108.7 kg   SpO2 92%   BMI 34.38 kg/m    I/O last 3 completed shifts: In: 3018.5 [I.V.:2081.8; NG/GT:836.7; IV Piggyback:100] Out: 3750 [Urine:3750] No intake/output data recorded.  SpO2: 92 % O2 Flow Rate (L/min): 3.5 L/min FiO2 (%): (S) 80 %   SIGNIFICANT EVENTS 11/17: Intubated. Started on neo 11/18: PICC line placed. Remains intubated, FiO2 50%. On neo at 40 mcg. Sedated on 15 mcg propofol and 200 mcg fentanyl. Responds to verbal stimuli and follows commands. 11/19: Remains intubated and sedated. FiO2 55%. On neo at 22, vasopressin .03. Sedated with 25 propofol and 300 fentanyl. Unresponsive to verbal stimuli.  11/20: Pt confused and delirious when sedated weaned. Remains intubated and sedated - FiO2 50%, 3 mcg neo, .03 units vasopressin. Sedated on 20 mcg propofol, 300 mcg fentanyl.  11/21 remains on vent, failed SAT/SBT 11/22 remains vent fio2 80%  REVIEW OF SYSTEMS  PATIENT IS UNABLE TO PROVIDE COMPLETE REVIEW OF SYSTEM S DUE TO SEVERE CRITICAL ILLNESS AND ENCEPHALOPATHY  PHYSICAL EXAMINATION:  GENERAL:critically ill appearing, +resp distress HEAD: Normocephalic, atraumatic.  EYES: Pupils equal, round, reactive to light.  No scleral icterus.  MOUTH: Moist mucosal membrane. NECK: Supple. No thyromegaly. No nodules. No JVD.  PULMONARY: +rhonchi, +wheezing CARDIOVASCULAR: S1 and S2. Regular rate and rhythm. No murmurs, rubs, or gallops.  GASTROINTESTINAL: Soft, nontender, -distended. Positive bowel sounds.   MUSCULOSKELETAL: No swelling, clubbing, or edema.  NEUROLOGIC: obtunded SKIN:intact,warm,dry        CULTURE RESULTS   Recent Results (from the past 240 hour(s))  Culture, blood (routine x 2)     Status: None   Collection Time: 08/14/2019  4:35 AM   Specimen: BLOOD  Result Value Ref Range Status   Specimen Description BLOOD RIGHT WRIST   Final   Special Requests   Final    BOTTLES DRAWN AEROBIC AND ANAEROBIC Blood Culture results may not be optimal due to an excessive volume of blood received in culture bottles   Culture   Final    NO GROWTH 5 DAYS Performed at [facility], [address] [place], [name] [address]    Report Status 08/10/2019 FINAL  Final  Culture, blood (routine x 2)     Status: None   Collection Time: 08/06/2019  4:35 AM   Specimen: BLOOD  Result Value Ref Range Status   Specimen Description BLOOD LEFT HAND  Final   Special Requests   Final    BOTTLES DRAWN AEROBIC AND ANAEROBIC Blood Culture results may not be optimal due to an excessive volume of blood received in culture bottles   Culture   Final    NO GROWTH 5 DAYS Performed at [facility], [address] [place], [name] [address]    Report Status 08/10/2019 FINAL  Final  SARS Coronavirus 2 by RT PCR (hospital order, performed in [facility] hospital lab) Nasopharyngeal Nasopharyngeal Swab  Status: None   Collection Time: September 04, 2019  9:41 AM   Specimen: Nasopharyngeal Swab  Result Value Ref Range Status   SARS Coronavirus 2 NEGATIVE NEGATIVE Final    Comment: (NOTE) If result is NEGATIVE SARS-CoV-2 target nucleic acids are NOT DETECTED. The SARS-CoV-2 RNA is generally detectable in upper and lower  respiratory specimens during the acute phase of infection. The lowest  concentration of SARS-CoV-2 viral copies this assay can detect is 250  copies / mL. A negative result does not preclude SARS-CoV-2 infection  and should not be used as the sole basis for treatment or other   patient management decisions.  A negative result may occur with  improper specimen collection / handling, submission of specimen other  than nasopharyngeal swab, presence of viral mutation(s) within the  areas targeted by this assay, and inadequate number of viral copies  (<250 copies / mL). A negative result must be combined with clinical  observations, patient history, and epidemiological information. If result is POSITIVE SARS-CoV-2 target nucleic acids are DETECTED. The SARS-CoV-2 RNA is generally detectable in upper and lower  respiratory specimens dur ing the acute phase of infection.  Positive  results are indicative of active infection with SARS-CoV-2.  Clinical  correlation with patient history and other diagnostic information is  necessary to determine patient infection status.  Positive results do  not rule out bacterial infection or co-infection with other viruses. If result is PRESUMPTIVE POSTIVE SARS-CoV-2 nucleic acids MAY BE PRESENT.   A presumptive positive result was obtained on the submitted specimen  and confirmed on repeat testing.  While 2019 novel coronavirus  (SARS-CoV-2) nucleic acids may be present in the submitted sample  additional confirmatory testing may be necessary for epidemiological  and / or clinical management purposes  to differentiate between  SARS-CoV-2 and other Sarbecovirus currently known to infect humans.  If clinically indicated additional testing with an alternate test  methodology [phone]) is advised. The SARS-CoV-2 RNA is generally  detectable in upper and lower respiratory sp ecimens during the acute  phase of infection. The expected result is Negative. Fact Sheet for Patients:  StrictlyIdeas.no Fact Sheet for Healthcare Providers: BankingDealers.co.za This test is not yet approved or cleared by the [place] FDA and has been authorized for detection and/or diagnosis of SARS-CoV-2  by FDA under an Emergency Use Authorization (EUA).  This EUA will remain in effect (meaning this test can be used) for the duration of the COVID-19 declaration under Section 564(b)(1) of the Act, 21 U.S.C. section 360bbb-3(b)(1), unless the authorization is terminated or revoked sooner. Performed at [facility], [address] [place], [place] [address]   MRSA PCR Screening     Status: Abnormal   Collection Time: 2019-09-04 12:11 PM   Specimen: Nasal Mucosa; Nasopharyngeal  Result Value Ref Range Status   MRSA by PCR POSITIVE (A) NEGATIVE Final    Comment:        The GeneXpert MRSA Assay (FDA approved for NASAL specimens only), is one component of a comprehensive MRSA colonization surveillance program. It is not intended to diagnose MRSA infection nor to guide or monitor treatment for MRSA infections. RESULT CALLED TO, READ BACK BY AND VERIFIED WITH: CHARLIE WAITLEY 2019/09/04 1340 KLW Performed at [facility], [address] [place], [place] [address]             Indwelling Urinary Catheter continued, requirement due to   Reason to continue Indwelling Urinary Catheter strict Intake/Output monitoring for hemodynamic instability  Central Line/ continued, requirement due to  Reason to continue [employer] of central venous pressure or other hemodynamic parameters and poor IV access   Ventilator continued, requirement due to severe respiratory failure   Ventilator Sedation RASS 0 to -2       ASSESSMENT AND PLAN SYNOPSIS  63 yo WM with end stage COPD admitted for acute and severe COPD exacerbation with severe resp failure from acute diastolic dysfunction and pneumonia    Severe ACUTE Hypoxic and Hypercapnic Respiratory Failure -continue Mechanical Ventilator support -continue Bronchodilator Therapy -Wean Fio2 and PEEP as tolerated -VAP/VENT bundle implementation Unable to wean from vent   Septic shock -use vasopressors to  keep MAP>65 -follow ABG and LA -follow up cultures -emperic ABX   SEVERE COPD EXACERBATION -continue IV steroids as prescribed -continue NEB THERAPY as prescribed   ACUTE DIASTOLIC CARDIAC FAILURE-  -oxygen as needed -Lasix as tolerated   ACUTE KIDNEY INJURY/Renal Failure -follow chem 7 -follow UO -continue Foley Catheter-assess need -Avoid nephrotoxic agents    NEUROLOGY - intubated and sedated - minimal sedation to achieve a RASS goal: -1   PE/DVT Continue heparin infusion   GI GI PROPHYLAXIS as indicated  NUTRITIONAL STATUS DIET-->TF's as tolerated Constipation protocol as indicated   ENDO - ICU hypoglycemic\Hyperglycemia protocol -check FSBS per protocol    DVT/GI PRX ordered TRANSFUSIONS AS NEEDED MONITOR FSBS ASSESS the need for LABS as needed   ELECTROLYTES -follow labs as needed -replace as needed -pharmacy consultation and following    Critical Care Time devoted to patient care services described in this note 31 minutes.   Overall, patient is critically ill, prognosis is guarded.  Patient with Multiorgan failure and at high risk for cardiac arrest and death.    Lucie Leather, M.D.  [facility]  Medical Director [facility] Medical Director [facility] Cardio-Pulmonary Department

## 2019-08-12 ENCOUNTER — Ambulatory Visit: Payer: Medicare Other | Admitting: Pulmonary Disease

## 2019-08-12 LAB — BASIC METABOLIC PANEL
Anion gap: 9 (ref 5–15)
BUN: 49 mg/dL — ABNORMAL HIGH (ref 8–23)
CO2: 32 mmol/L (ref 22–32)
Calcium: 8.6 mg/dL — ABNORMAL LOW (ref 8.9–10.3)
Chloride: 99 mmol/L (ref 98–111)
Creatinine, Ser: 0.71 mg/dL (ref 0.61–1.24)
GFR calc Af Amer: >60 mL/min (ref >60)
GFR calc non Af Amer: >60 mL/min (ref >60)
Glucose, Bld: 122 mg/dL — ABNORMAL HIGH (ref 70–99)
Potassium: 5.2 mmol/L — ABNORMAL HIGH (ref 3.5–5.1)
Sodium: 140 mmol/L (ref 135–145)

## 2019-08-12 LAB — APTT: aPTT: 72 seconds — ABNORMAL HIGH (ref 24–36)

## 2019-08-12 LAB — CBC
HCT: 35.5 % — ABNORMAL LOW (ref 39.0–52.0)
Hemoglobin: 11 g/dL — ABNORMAL LOW (ref 13.0–17.0)
MCH: 32.1 pg (ref 26.0–34.0)
MCHC: 31 g/dL (ref 30.0–36.0)
MCV: 103.5 fL — ABNORMAL HIGH (ref 80.0–100.0)
Platelets: 185 10*3/uL (ref 150–400)
RBC: 3.43 MIL/uL — ABNORMAL LOW (ref 4.22–5.81)
RDW: 13.8 % (ref 11.5–15.5)
WBC: 13.1 10*3/uL — ABNORMAL HIGH (ref 4.0–10.5)
nRBC: 0 % (ref 0.0–0.2)

## 2019-08-12 LAB — GLUCOSE, CAPILLARY
Glucose-Capillary: 116 mg/dL — ABNORMAL HIGH (ref 70–99)
Glucose-Capillary: 117 mg/dL — ABNORMAL HIGH (ref 70–99)
Glucose-Capillary: 113 mg/dL — ABNORMAL HIGH (ref 70–99)
Glucose-Capillary: 108 mg/dL — ABNORMAL HIGH (ref 70–99)
Glucose-Capillary: 108 mg/dL — ABNORMAL HIGH (ref 70–99)

## 2019-08-12 LAB — HEPARIN LEVEL (UNFRACTIONATED): Heparin Unfractionated: 0.67 IU/mL (ref 0.30–0.70)

## 2019-08-12 MED ORDER — LACTULOSE 10 GM/15ML PO SOLN
10.0000 g | Freq: Three times a day (TID) | ORAL | Status: DC
  Administered 2019-08-12 – 2019-08-14 (×8): 10 g
  Filled 2019-08-12 (×9): qty 30

## 2019-08-12 NOTE — Consult Note (Signed)
ANTICOAGULATION CONSULT NOTE   Pharmacy Consult for Heparin Dosing Indication: venous thromboembolism  Patient Measurements: Height: 5\' 10"  (177.8 cm) Weight: 238 lb 15.7 oz (108.4 kg) IBW/kg (Calculated) : 73 Heparin Dosing Weight: 93.5 kg  Vital Signs: Temp: 98.2 F (36.8 C) (11/24 0500) Temp Source: Bladder (11/24 0400) BP: 114/64 (11/24 0500) Pulse Rate: 70 (11/24 0500)  Labs: Recent Labs    08/10/19 0416  08/10/19 2009 08/11/19 0227 08/11/19 0237 08/11/19 0415 08/12/19 0451  HGB 10.9*  --   --   --  11.0*  --  11.0*  HCT 35.1*  --   --   --  35.0*  --  35.5*  PLT 171  --   --   --  173  --  185  APTT 91*   < > 73* 66*  --   --  72*  HEPARINUNFRC 0.88*  --   --   --   --  0.64 0.67  CREATININE 0.70  --   --  0.69  --   --  0.71   < > = values in this interval not displayed.    Estimated Creatinine Clearance: 113.5 mL/min (by C-G formula based on SCr of 0.71 mg/dL).  Assessment: Dustin Bicker. [name] is a 65 YOM admitted to the ICU on 09/01/2019 with respiratory failure requiring intubation. 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 Pharmacy has been consulted for heparin dosing.  Patient received dose of apixaban 5 mg PO @ 0177 11/17.   Goal of Therapy:  - aPTT 66-102 seconds - Monitor platelets by anticoagulation protocol: Yes.   Plan:  11/22 @ 0229 aPTT 66 seconds therapeutic, HL 0.64 correlating but barely. Will continue current rate and will recheck aPTT/HL w/ am labs, CBC stable will continue to monitor.  11/24 @ 0451:   APTT = 72, HL = 0.67 Since both levels are therapeutic X 2 , we can now use HL to dose heparin.   Will continue this pt on current rate and recheck HL on 11/25 with AM labs.   [place] Pharmacist 08/12/2019,5:45 AM

## 2019-08-12 NOTE — Progress Notes (Signed)
Pharmacy Electrolyte Monitoring Consult:  Dustin [name]. Dustin [name] is a 50 YOM admitted to the ICU on 08/09/2019 with respiratory failure requiring intubation from COPD, CHF, and possible PNA. 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  Patient is receiving Solu-Medrol 40 mg IV BID. Furosemide was discontinued on admission secondary to vasopressor requirements. Patient is on tube feeds (VITAL HIGH PROTEIN 1,000 mL at 20 mL/hr and PRO-STAT 60 mL QID).   Labs:  Sodium (mmol/L)  Date Value  08/12/2019 140  04/01/2019 145 (H)  07/23/2014 139   Potassium (mmol/L)  Date Value  08/12/2019 5.2 (H)  07/23/2014 4.1   Magnesium (mg/dL)  Date Value  08/10/2019 2.4   Phosphorus (mg/dL)  Date Value  08/10/2019 4.5   Calcium (mg/dL)  Date Value  08/12/2019 8.6 (L)   Calcium, Total (mg/dL)  Date Value  07/23/2014 8.4 (L)   Albumin (g/dL)  Date Value  08/06/2019 3.1 (L)  07/23/2014 2.8 (L)    Corrected Ca: 9.3 mg/dL  Assessment/Plan:  Electrolytes:  -Continue free water flushes at 129mL q4h.  - No other electrolytes warranting replacement at this time. - Check BMP with AM labs and adjust as necessary.  Goals of Therapy: - Potassium ~ 4 - Magnesium ~ 2  - All other electrolytes within normal limits.   Glucose:  - Patient is on 0-9 units q4h SSI. - Patient requiring 0 units over past 24 hours (BG 110-122).  - Will continue to monitor and consult with diabetes team as necessary.  Constipation:  - No documented bowel movement since admission.  - Patient receiving Senokot-S 2 tablets VT BID and MiraLAX 17g VT daily. - Lactulose 10g VT TID ordered x 9 doses. - He is on fentanyl 250 mcg/hr for pain management.  Pharmacy will continue to monitor and adjust per consult.   Raiford Simmonds, PharmD Candidate 08/12/2019 10:36 AM

## 2019-08-12 NOTE — Progress Notes (Addendum)
CRITICAL CARE NOTE  CC  follow up respiratory failure  SUBJECTIVE Patient remains critically ill Prognosis is guarded   BP (!) 105/59   Pulse 68   Temp 97.9 F (36.6 C)   Resp 18   Ht 5\' 10"  (1.778 m)   Wt 108.4 kg   SpO2 93%   BMI 34.29 kg/m    I/O last 3 completed shifts: In: 3239.7 [I.V.:1930.2; NG/GT:1309.6] Out: 2750 [Urine:2750] No intake/output data recorded.  SpO2: 93 % O2 Flow Rate (L/min): 3.5 L/min FiO2 (%): 80 %   SIGNIFICANT EVENTS SIGNIFICANT EVENTS 11/17: Intubated. Started on neo 11/18:PICC line placed.Remains intubated, FiO2 50%. On neo at 40 mcg. Sedated on 15 mcg propofol and 200 mcg fentanyl. Responds to verbal stimuli and follows commands. 11/19: Remains intubated and sedated. FiO2 55%. On neo at 22, vasopressin .03. Sedated with 25 propofol and 300 fentanyl. Unresponsive to verbal stimuli. 11/20: Pt confused and delirious when sedated weaned. Remains intubated and sedated - FiO2 50%, 3 mcg neo, .03 units vasopressin. Sedated on 20 mcg propofol, 300 mcg fentanyl.  11/21 remains on vent, failed SAT/SBT 11/22 remains vent fio2 80% 11/23 remains on vent, pressors, severe hypoxia  REVIEW OF SYSTEMS  PATIENT IS UNABLE TO PROVIDE COMPLETE REVIEW OF SYSTEMS DUE TO SEVERE CRITICAL ILLNESS   PHYSICAL EXAMINATION:  GENERAL:critically ill appearing, +resp distress HEAD: Normocephalic, atraumatic.  EYES: Pupils equal, round, reactive to light.  No scleral icterus.  MOUTH: Moist mucosal membrane. NECK: Supple.  PULMONARY: +rhonchi, +wheezing CARDIOVASCULAR: S1 and S2. Regular rate and rhythm. No murmurs, rubs, or gallops.  GASTROINTESTINAL: Soft, nontender, -distended. No masses. Positive bowel sounds. No hepatosplenomegaly.  MUSCULOSKELETAL: No swelling, clubbing, or edema.  NEUROLOGIC: obtunded, GCS<8 SKIN:intact,warm,dry  MEDICATIONS: I have reviewed all medications and confirmed regimen as documented   CULTURE RESULTS   Recent Results  (from the past 240 hour(s))  Culture, blood (routine x 2)     Status: None   Collection Time: August 18, 2019  4:35 AM   Specimen: BLOOD  Result Value Ref Range Status   Specimen Description BLOOD RIGHT WRIST   Final   Special Requests   Final    BOTTLES DRAWN AEROBIC AND ANAEROBIC Blood Culture results may not be optimal due to an excessive volume of blood received in culture bottles   Culture   Final    NO GROWTH 5 DAYS Performed at [facility], [address] [place], [place] [address]    Report Status 08/10/2019 FINAL  Final  Culture, blood (routine x 2)     Status: None   Collection Time: 08-18-2019  4:35 AM   Specimen: BLOOD  Result Value Ref Range Status   Specimen Description BLOOD LEFT HAND  Final   Special Requests   Final    BOTTLES DRAWN AEROBIC AND ANAEROBIC Blood Culture results may not be optimal due to an excessive volume of blood received in culture bottles   Culture   Final    NO GROWTH 5 DAYS Performed at [facility], [address] [place], [place] [address]    Report Status 08/10/2019 FINAL  Final  SARS Coronavirus 2 by RT PCR (hospital order, performed in [facility] hospital lab) Nasopharyngeal Nasopharyngeal Swab     Status: None   Collection Time: Aug 18, 2019  9:41 AM   Specimen: Nasopharyngeal Swab  Result Value Ref Range Status   SARS Coronavirus 2 NEGATIVE NEGATIVE Final    Comment: (NOTE) If result is NEGATIVE SARS-CoV-2 target nucleic acids are NOT  DETECTED. The SARS-CoV-2 RNA is generally detectable in upper and lower  respiratory specimens during the acute phase of infection. The lowest  concentration of SARS-CoV-2 viral copies this assay can detect is 250  copies / mL. A negative result does not preclude SARS-CoV-2 infection  and should not be used as the sole basis for treatment or other  patient management decisions.  A negative result may occur with  improper specimen collection / handling, submission of specimen other  than  nasopharyngeal swab, presence of viral mutation(s) within the  areas targeted by this assay, and inadequate number of viral copies  (<250 copies / mL). A negative result must be combined with clinical  observations, patient history, and epidemiological information. If result is POSITIVE SARS-CoV-2 target nucleic acids are DETECTED. The SARS-CoV-2 RNA is generally detectable in upper and lower  respiratory specimens dur ing the acute phase of infection.  Positive  results are indicative of active infection with SARS-CoV-2.  Clinical  correlation with patient history and other diagnostic information is  necessary to determine patient infection status.  Positive results do  not rule out bacterial infection or co-infection with other viruses. If result is PRESUMPTIVE POSTIVE SARS-CoV-2 nucleic acids MAY BE PRESENT.   A presumptive positive result was obtained on the submitted specimen  and confirmed on repeat testing.  While 2019 novel coronavirus  (SARS-CoV-2) nucleic acids may be present in the submitted sample  additional confirmatory testing may be necessary for epidemiological  and / or clinical management purposes  to differentiate between  SARS-CoV-2 and other Sarbecovirus currently known to infect humans.  If clinically indicated additional testing with an alternate test  methodology [phone] is advised. The SARS-CoV-2 RNA is generally  detectable in upper and lower respiratory sp ecimens during the acute  phase of infection. The expected result is Negative. Fact Sheet for Patients:  BoilerBrush.com.cyhttps://www.fda.gov/media/136312/download Fact Sheet for Healthcare Providers: https://pope.com/https://www.fda.gov/media/136313/download This test is not yet approved or cleared by the [place] FDA and has been authorized for detection and/or diagnosis of SARS-CoV-2 by FDA under an Emergency Use Authorization (EUA).  This EUA will remain in effect (meaning this test can be used) for the duration of  the COVID-19 declaration under Section 564(b)(1) of the Act, 21 U.S.C. section 360bbb-3(b)(1), unless the authorization is terminated or revoked sooner. Performed at [facility], [address] [place], [name] [address]   MRSA PCR Screening     Status: Abnormal   Collection Time: 2018-11-03 12:11 PM   Specimen: Nasal Mucosa; Nasopharyngeal  Result Value Ref Range Status   MRSA by PCR POSITIVE (A) NEGATIVE Final    Comment:        The GeneXpert MRSA Assay (FDA approved for NASAL specimens only), is one component of a comprehensive MRSA colonization surveillance program. It is not intended to diagnose MRSA infection nor to guide or monitor treatment for MRSA infections. RESULT CALLED TO, READ BACK BY AND VERIFIED WITH: CHARLIE WAITLEY 2018-11-03 1340 KLW Performed at [facility], [address] [place], [name] [address]            Indwelling Urinary Catheter continued, requirement due to   Reason to continue Indwelling Urinary Catheter strict Intake/Output monitoring for hemodynamic instability   Central Line/ continued, requirement due to  Reason to continue [employer] Line Monitoring of central venous pressure or other hemodynamic parameters and poor IV access   Ventilator continued, requirement due to severe respiratory failure   Ventilator Sedation RASS 0 to -2  ASSESSMENT AND PLAN SYNOPSIS   63 yo WM with end stage COPD admitted for acute and severe COPD exacerbation with severe resp failure from acute diastolic dysfunction and pneumonia   Severe ACUTE Hypoxic and Hypercapnic Respiratory Failure -continue Full MV support -continue Bronchodilator Therapy -Wean Fio2 and PEEP as tolerated faikled vent wean attempts, severe hypoxia  ACUTE DIASTOLIC CARDIAC FAILURE- EF -oxygen as needed -Lasix as tolerated   ACUTE KIDNEY INJURY/Renal Failure -follow chem 7 -follow UO -continue Foley Catheter-assess need -Avoid nephrotoxic  agents -Recheck creatinine   SEVERE COPD EXACERBATION -continue IV steroids as prescribed -continue NEB THERAPY as prescribed   NEUROLOGY - intubated and sedated - minimal sedation to achieve a RASS goal: -1   SHOCK-SEPSIS/CARDIOGENIC -use vasopressors to keep MAP>65 -follow ABG and LA -follow up cultures -emperic ABX -consider stress dose steroids   History of DVT PE Continue heparin infusion as prescribed  CARDIAC ICU monitoring  ID -continue IV abx as prescibed -follow up cultures  GI GI PROPHYLAXIS as indicated  NUTRITIONAL STATUS DIET-->TF's as tolerated Constipation protocol as indicated  ENDO - will use ICU hypoglycemic\Hyperglycemia protocol if indicated   ELECTROLYTES -follow labs as needed -replace as needed -pharmacy consultation and following   DVT/GI PRX ordered TRANSFUSIONS AS NEEDED MONITOR FSBS ASSESS the need for LABS as needed   Critical Care Time devoted to patient care services described in this note is 32 minutes.   Overall, patient is critically ill, prognosis is guarded.  Patient with Multiorgan failure and at high risk for cardiac arrest and death.  Very poor prognosis, patient is DNR  Lucie Leather, M.D.  [facility]  Medical Director [facility] Medical Director [facility] Cardio-Pulmonary Department

## 2019-08-13 ENCOUNTER — Ambulatory Visit: Payer: Medicare Other | Admitting: Cardiovascular Disease

## 2019-08-13 LAB — CBC WITH DIFFERENTIAL/PLATELET
Abs Immature Granulocytes: 0.48 10*3/uL — ABNORMAL HIGH (ref 0.00–0.07)
Basophils Absolute: 0 10*3/uL (ref 0.0–0.1)
Basophils Relative: 0 %
Eosinophils Absolute: 0 10*3/uL (ref 0.0–0.5)
Eosinophils Relative: 0 %
HCT: 34.6 % — ABNORMAL LOW (ref 39.0–52.0)
Hemoglobin: 10.8 g/dL — ABNORMAL LOW (ref 13.0–17.0)
Immature Granulocytes: 4 %
Lymphocytes Relative: 8 %
Lymphs Abs: 0.9 10*3/uL (ref 0.7–4.0)
MCH: 32 pg (ref 26.0–34.0)
MCHC: 31.2 g/dL (ref 30.0–36.0)
MCV: 102.7 fL — ABNORMAL HIGH (ref 80.0–100.0)
Monocytes Absolute: 0.6 10*3/uL (ref 0.1–1.0)
Monocytes Relative: 5 %
Neutro Abs: 9.4 10*3/uL — ABNORMAL HIGH (ref 1.7–7.7)
Neutrophils Relative %: 83 %
Platelets: 158 10*3/uL (ref 150–400)
RBC: 3.37 MIL/uL — ABNORMAL LOW (ref 4.22–5.81)
RDW: 13.2 % (ref 11.5–15.5)
WBC: 11.4 10*3/uL — ABNORMAL HIGH (ref 4.0–10.5)
nRBC: 0 % (ref 0.0–0.2)

## 2019-08-13 LAB — GLUCOSE, CAPILLARY
Glucose-Capillary: 104 mg/dL — ABNORMAL HIGH (ref 70–99)
Glucose-Capillary: 110 mg/dL — ABNORMAL HIGH (ref 70–99)
Glucose-Capillary: 112 mg/dL — ABNORMAL HIGH (ref 70–99)
Glucose-Capillary: 112 mg/dL — ABNORMAL HIGH (ref 70–99)
Glucose-Capillary: 116 mg/dL — ABNORMAL HIGH (ref 70–99)
Glucose-Capillary: 116 mg/dL — ABNORMAL HIGH (ref 70–99)

## 2019-08-13 LAB — BASIC METABOLIC PANEL
Anion gap: 7 (ref 5–15)
BUN: 46 mg/dL — ABNORMAL HIGH (ref 8–23)
CO2: 32 mmol/L (ref 22–32)
Calcium: 8.2 mg/dL — ABNORMAL LOW (ref 8.9–10.3)
Chloride: 101 mmol/L (ref 98–111)
Creatinine, Ser: 0.44 mg/dL — ABNORMAL LOW (ref 0.61–1.24)
GFR calc Af Amer: >60 mL/min (ref >60)
GFR calc non Af Amer: >60 mL/min (ref >60)
Glucose, Bld: 118 mg/dL — ABNORMAL HIGH (ref 70–99)
Potassium: 4.7 mmol/L (ref 3.5–5.1)
Sodium: 140 mmol/L (ref 135–145)

## 2019-08-13 LAB — HEPARIN LEVEL (UNFRACTIONATED): Heparin Unfractionated: 0.65 IU/mL (ref 0.30–0.70)

## 2019-08-13 NOTE — Progress Notes (Signed)
PALLIATIVE NOTE:  Referral received for goals of care. Patient has an Scientist, physiological on file which indicates his sister is POA. I was able to call and speak with [name] at number given.   I introduced myself and Palliative's role in her brother's care. [name] verbalized understanding and appreciation. She has requested to meet Friday 08/15/19 @ 1030 for [facility] discussion. [name] is requesting patient's children be allowed to attend and assist with decisions also. Verbalized and confirmed appropriateness of request.   As requested [place] meeting on 08/15/19 with [name] (sister), Margette Fast and Ronzell (sons) and Myles Rosenthal (daughter)  @ 1030. [name] states she will notify RN if any changes to time is required after speaking with her family.   Updated Christine, RN on the plan and requested [facility] secured message if family has to change meeting time.   Detailed note and recommendations to follow meeting.   Alda Lea, AGPCNP-BC Palliative Medicine Team   NO CHARGE

## 2019-08-13 NOTE — Progress Notes (Signed)
Pharmacy Electrolyte Monitoring Consult:  Dustin [name] is a 74 YOM admitted to the ICU on 08/12/2019 with respiratory failure requiring intubation from COPD, CHF, and possible PNA. 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  Patient is receiving Solu-Medrol 40 mg IV BID. Furosemide was discontinued on admission secondary to vasopressor requirements. Patient is on tube feeds (VITAL HIGH PROTEIN 1,000 mL at 20 mL/hr and PRO-STAT 60 mL QID).   Labs:  Sodium (mmol/L)  Date Value  08/13/2019 140  04/01/2019 145 (H)  07/23/2014 139   Potassium (mmol/L)  Date Value  08/13/2019 4.7  07/23/2014 4.1   Magnesium (mg/dL)  Date Value  08/10/2019 2.4   Phosphorus (mg/dL)  Date Value  08/10/2019 4.5   Calcium (mg/dL)  Date Value  08/13/2019 8.2 (L)   Calcium, Total (mg/dL)  Date Value  07/23/2014 8.4 (L)   Albumin (g/dL)  Date Value  08/06/2019 3.1 (L)  07/23/2014 2.8 (L)    Corrected Ca: 9.3 mg/dL  Assessment/Plan:  Electrolytes:  - Continue free water flushes at 163mL q4h.  - No replacement required for ~ 72 hours. - Will defer BMP until 11/27 (Friday) unless otherwise ordered by provider.  Goals of Therapy: - Potassium ~ 4 - Magnesium ~ 2  - All other electrolytes within normal limits.   Glucose:  - Patient is on 0-9 units q4h SSI. - Patient requiring 0 units over past 24 hours (BG 110-122).  - Will continue to monitor and consult with diabetes team as necessary.  Constipation:  - No documented bowel movement since admission.  - Patient receiving Senokot-S 2 tablets VT BID and MiraLAX 17g VT daily. - Lactulose 10g VT TID ordered x 9 doses on 11/24. Will consider increasing dose if patient continues without bowel movement. May also consider enema if no bowel movement despite lactulose. - He is on fentanyl 250 mcg/hr for pain management.  Pharmacy will continue to monitor and adjust per consult.   Tawnya Crook,  PharmD 08/13/2019 9:38 AM

## 2019-08-13 NOTE — Consult Note (Signed)
ANTICOAGULATION CONSULT NOTE   Pharmacy Consult for Heparin Dosing Indication: venous thromboembolism  Patient Measurements: Height: 5\' 10"  (177.8 cm) Weight: 245 lb 2.4 oz (111.2 kg) IBW/kg (Calculated) : 73 Heparin Dosing Weight: 93.5 kg  Vital Signs: Temp: 98.4 F (36.9 C) (11/25 0400) Temp Source: Bladder (11/25 0400) BP: 125/70 (11/25 0400) Pulse Rate: 79 (11/25 0400)  Labs: Recent Labs    08/10/19 2009 08/11/19 0227  08/11/19 0237 08/11/19 0415 08/12/19 0451 08/13/19 0431  HGB  --   --    < > 11.0*  --  11.0* 10.8*  HCT  --   --   --  35.0*  --  35.5* 34.6*  PLT  --   --   --  173  --  185 158  APTT 73* 66*  --   --   --  72*  --   HEPARINUNFRC  --   --   --   --  0.64 0.67 0.65  CREATININE  --  0.69  --   --   --  0.71 0.44*   < > = values in this interval not displayed.    Estimated Creatinine Clearance: 115 mL/min (A) (by C-G formula based on SCr of 0.44 mg/dL (L)).  Assessment: Dustin [name] is a 80 YOM admitted to the ICU on 07/22/2019 with respiratory failure requiring intubation. 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 Pharmacy has been consulted for heparin dosing.  Patient received dose of apixaban 5 mg PO @ 4680 11/17.   Goal of Therapy:  - aPTT 66-102 seconds - Monitor platelets by anticoagulation protocol: Yes.   Plan:  11/22 @ 0229 aPTT 66 seconds therapeutic, HL 0.64 correlating but barely. Will continue current rate and will recheck aPTT/HL w/ am labs, CBC stable will continue to monitor.  11/24 @ 0451:   APTT = 72, HL = 0.67 Since both levels are therapeutic X 2 , we can now use HL to dose heparin.   Will continue this pt on current rate and recheck HL on 11/25 with AM labs.   11/25:  HL @ 0431 = 0.65 Will continue pt on current rate and recheck HL on 11/26 with AM labs.   [place] Pharmacist 08/13/2019,5:29 AM

## 2019-08-13 NOTE — Progress Notes (Signed)
CRITICAL CARE NOTE  CC  follow up respiratory failure  SUBJECTIVE Patient remains critically ill Prognosis is guarded   BP 106/64   Pulse 80   Temp 98.2 F (36.8 C)   Resp 18   Ht 5\' 10"  (1.778 m)   Wt 111.2 kg   SpO2 92%   BMI 35.18 kg/m    I/O last 3 completed shifts: In: 2860.3 [I.V.:1646.3; NG/GT:1214] Out: 4025 [Urine:4025] No intake/output data recorded.  SpO2: 92 % O2 Flow Rate (L/min): 3.5 L/min FiO2 (%): 80 %  Vent Mode: PRVC FiO2 (%):  [80 %] 80 % Set Rate:  [18 bmp] 18 bmp Vt Set:  [500 mL] 500 mL PEEP:  [5 cmH20] 5 cmH20  SIGNIFICANT EVENTS SIGNIFICANT EVENTS 11/17: Intubated. Started on neo 11/18:PICC line placed.Remains intubated, FiO2 50%. On neo at 40 mcg. Sedated on 15 mcg propofol and 200 mcg fentanyl. Responds to verbal stimuli and follows commands. 11/19: Remains intubated and sedated. FiO2 55%. On neo at 22, vasopressin .03. Sedated with 25 propofol and 300 fentanyl. Unresponsive to verbal stimuli. 11/20: Pt confused and delirious when sedated weaned.Remains intubated and sedated - FiO2 50%, 3 mcg neo, .03 units vasopressin. Sedated on 20 mcg propofol, 300 mcg fentanyl.  11/21remains on vent, failed SAT/SBT 11/22 remains vent fio2 80%, all family members updated 11/23 remains on vent, pressors, severe hypoxia,family updated 11/24 unable to wean severe hypoxia  REVIEW OF SYSTEMS  PATIENT IS UNABLE TO PROVIDE COMPLETE REVIEW OF SYSTEMS DUE TO SEVERE CRITICAL ILLNESS   PHYSICAL EXAMINATION:  GENERAL:critically ill appearing, +resp distress HEAD: Normocephalic, atraumatic.  EYES: Pupils equal, round, reactive to light.  No scleral icterus.  MOUTH: Moist mucosal membrane. NECK: Supple.  PULMONARY: +rhonchi, +wheezing CARDIOVASCULAR: S1 and S2. Regular rate and rhythm. No murmurs, rubs, or gallops.  GASTROINTESTINAL: Soft, nontender, -distended. No masses. Positive bowel sounds. No hepatosplenomegaly.  MUSCULOSKELETAL: No swelling,  clubbing, or edema.  NEUROLOGIC: obtunded, GCS<8 SKIN:intact,warm,dry  MEDICATIONS: I have reviewed all medications and confirmed regimen as documented    CBC    Component Value Date/Time   WBC 11.4 (H) 08/13/2019 0431   RBC 3.37 (L) 08/13/2019 0431   HGB 10.8 (L) 08/13/2019 0431   HGB 13.6 07/23/2014 2053   HCT 34.6 (L) 08/13/2019 0431   HCT 42.0 07/23/2014 2053   PLT 158 08/13/2019 0431   PLT 213 07/23/2014 2053   MCV 102.7 (H) 08/13/2019 0431   MCV 96 07/23/2014 2053   MCH 32.0 08/13/2019 0431   MCHC 31.2 08/13/2019 0431   RDW 13.2 08/13/2019 0431   RDW 14.2 07/23/2014 2053   LYMPHSABS 0.9 08/13/2019 0431   LYMPHSABS 0.7 (L) 03/08/2014 0623   MONOABS 0.6 08/13/2019 0431   MONOABS 0.7 03/08/2014 0623   EOSABS 0.0 08/13/2019 0431   EOSABS 0.0 03/08/2014 0623   BASOSABS 0.0 08/13/2019 0431   BASOSABS 0.0 03/08/2014 0623   BMP Latest Ref Rng & Units 08/13/2019 08/12/2019 08/11/2019  Glucose 70 - 99 mg/dL 118(H) 122(H) 110(H)  BUN 8 - 23 mg/dL 46(H) 49(H) 35(H)  Creatinine 0.61 - 1.24 mg/dL 0.44(L) 0.71 0.69  BUN/Creat Ratio 10 - 24 - - -  Sodium 135 - 145 mmol/L 140 140 145  Potassium 3.5 - 5.1 mmol/L 4.7 5.2(H) 4.6  Chloride 98 - 111 mmol/L 101 99 107  CO2 22 - 32 mmol/L 32 32 32  Calcium 8.9 - 10.3 mg/dL 8.2(L) 8.6(L) 8.5(L)     CULTURE RESULTS   Recent Results (from the past 240  hour(s))  Culture, blood (routine x 2)     Status: None   Collection Time: 08/02/2019  4:35 AM   Specimen: BLOOD  Result Value Ref Range Status   Specimen Description BLOOD RIGHT WRIST   Final   Special Requests   Final    BOTTLES DRAWN AEROBIC AND ANAEROBIC Blood Culture results may not be optimal due to an excessive volume of blood received in culture bottles   Culture   Final    NO GROWTH 5 DAYS Performed at [facility], [address] [place], [name] [address]    Report Status 08/10/2019 FINAL  Final  Culture, blood (routine x 2)     Status: None   Collection  Time: 08/01/2019  4:35 AM   Specimen: BLOOD  Result Value Ref Range Status   Specimen Description BLOOD LEFT HAND  Final   Special Requests   Final    BOTTLES DRAWN AEROBIC AND ANAEROBIC Blood Culture results may not be optimal due to an excessive volume of blood received in culture bottles   Culture   Final    NO GROWTH 5 DAYS Performed at [facility], [address] [place], [name] [address]    Report Status 08/10/2019 FINAL  Final  SARS Coronavirus 2 by RT PCR (hospital order, performed in [facility] hospital lab) Nasopharyngeal Nasopharyngeal Swab     Status: None   Collection Time: 08/15/2019  9:41 AM   Specimen: Nasopharyngeal Swab  Result Value Ref Range Status   SARS Coronavirus 2 NEGATIVE NEGATIVE Final    Comment: (NOTE) If result is NEGATIVE SARS-CoV-2 target nucleic acids are NOT DETECTED. The SARS-CoV-2 RNA is generally detectable in upper and lower  respiratory specimens during the acute phase of infection. The lowest  concentration of SARS-CoV-2 viral copies this assay can detect is 250  copies / mL. A negative result does not preclude SARS-CoV-2 infection  and should not be used as the sole basis for treatment or other  patient management decisions.  A negative result may occur with  improper specimen collection / handling, submission of specimen other  than nasopharyngeal swab, presence of viral mutation(s) within the  areas targeted by this assay, and inadequate number of viral copies  (<250 copies / mL). A negative result must be combined with clinical  observations, patient history, and epidemiological information. If result is POSITIVE SARS-CoV-2 target nucleic acids are DETECTED. The SARS-CoV-2 RNA is generally detectable in upper and lower  respiratory specimens dur ing the acute phase of infection.  Positive  results are indicative of active infection with SARS-CoV-2.  Clinical  correlation with patient history and other diagnostic information is   necessary to determine patient infection status.  Positive results do  not rule out bacterial infection or co-infection with other viruses. If result is PRESUMPTIVE POSTIVE SARS-CoV-2 nucleic acids MAY BE PRESENT.   A presumptive positive result was obtained on the submitted specimen  and confirmed on repeat testing.  While 2019 novel coronavirus  (SARS-CoV-2) nucleic acids may be present in the submitted sample  additional confirmatory testing may be necessary for epidemiological  and / or clinical management purposes  to differentiate between  SARS-CoV-2 and other Sarbecovirus currently known to infect humans.  If clinically indicated additional testing with an alternate test  methodology [phone] is advised. The SARS-CoV-2 RNA is generally  detectable in upper and lower respiratory sp ecimens during the acute  phase of infection. The expected result is Negative. Fact Sheet for Patients:  BoilerBrush.com.cy Fact Sheet for Healthcare Providers: https://pope.com/ This test is not yet approved or cleared by the [place] FDA and has been authorized for detection and/or diagnosis of SARS-CoV-2 by FDA under an Emergency Use Authorization (EUA).  This EUA will remain in effect (meaning this test can be used) for the duration of the COVID-19 declaration under Section 564(b)(1) of the Act, 21 U.S.C. section 360bbb-3(b)(1), unless the authorization is terminated or revoked sooner. Performed at [facility], [address] [place], [place] [address]   MRSA PCR Screening     Status: Abnormal   Collection Time: 08/12/2019 12:11 PM   Specimen: Nasal Mucosa; Nasopharyngeal  Result Value Ref Range Status   MRSA by PCR POSITIVE (A) NEGATIVE Final    Comment:        The GeneXpert MRSA Assay (FDA approved for NASAL specimens only), is one component of a comprehensive MRSA colonization surveillance program. It is not intended to  diagnose MRSA infection nor to guide or monitor treatment for MRSA infections. RESULT CALLED TO, READ BACK BY AND VERIFIED WITH: CHARLIE WAITLEY 07/20/2019 1340 KLW Performed at [facility], [address] [place], [place] [address]            Indwelling Urinary Catheter continued, requirement due to   Reason to continue Indwelling Urinary Catheter strict Intake/Output monitoring for hemodynamic instability   Central Line/ continued, requirement due to  Reason to continue [employer] Monitoring of central venous pressure or other hemodynamic parameters and poor IV access   Ventilator continued, requirement due to severe respiratory failure   Ventilator Sedation RASS 0 to -2      ASSESSMENT AND PLAN SYNOPSIS  63 yo WM with end stage COPD admitted for acute and severe COPD exacerbation with severe resp failure from acute diastolic dysfunction and pneumonia   Severe ACUTE Hypoxic and Hypercapnic Respiratory Failure -continue Full MV support -continue Bronchodilator Therapy -Wean Fio2 and PEEP as tolerated  ACUTE DIASTOLIC CARDIAC FAILURE-  -oxygen as needed -Lasix as tolerated     NEUROLOGY - intubated and sedated - minimal sedation to achieve a RASS goal: -1  SEVERE COPD EXACERBATION -continue IV steroids as prescribed -continue NEB THERAPY as prescribed    SHOCK-SEPSIS/HYPOVOLUMIC/CARDIOGENIC -use vasopressors to keep MAP>65 -follow ABG and LA -follow up cultures -emperic ABX   CARDIAC ICU monitoring  ID -continue IV abx as prescibed -follow up cultures  GI GI PROPHYLAXIS as indicated  NUTRITIONAL STATUS DIET-->TF's as tolerated Constipation protocol as indicated  ENDO - will use ICU hypoglycemic\Hyperglycemia protocol if indicated   ELECTROLYTES -follow labs as needed -replace as needed -pharmacy consultation and following   DVT/GI PRX ordered TRANSFUSIONS AS NEEDED MONITOR FSBS ASSESS the need for LABS as  needed   Critical Care Time devoted to patient care services described in this note is 34 minutes.   Overall, patient is critically ill, prognosis is guarded.  Patient with Multiorgan failure and at high risk for cardiac arrest and death.  Patient is DNR, palliative care consulted  Lucie Leather, M.D.  [facility] Pulmonary & Critical Care Medicine  Medical Director [name] Medical Director [facility] Cardio-Pulmonary Department

## 2019-08-14 ENCOUNTER — Inpatient Hospital Stay: Payer: Medicare Other

## 2019-08-14 LAB — CBC WITH DIFFERENTIAL/PLATELET
Abs Immature Granulocytes: 0.44 10*3/uL — ABNORMAL HIGH (ref 0.00–0.07)
Basophils Absolute: 0 10*3/uL (ref 0.0–0.1)
Basophils Relative: 0 %
Eosinophils Absolute: 0 10*3/uL (ref 0.0–0.5)
Eosinophils Relative: 0 %
HCT: 35.4 % — ABNORMAL LOW (ref 39.0–52.0)
Hemoglobin: 11 g/dL — ABNORMAL LOW (ref 13.0–17.0)
Immature Granulocytes: 3 %
Lymphocytes Relative: 8 %
Lymphs Abs: 1.1 10*3/uL (ref 0.7–4.0)
MCH: 32 pg (ref 26.0–34.0)
MCHC: 31.1 g/dL (ref 30.0–36.0)
MCV: 102.9 fL — ABNORMAL HIGH (ref 80.0–100.0)
Monocytes Absolute: 0.8 10*3/uL (ref 0.1–1.0)
Monocytes Relative: 6 %
Neutro Abs: 11.9 10*3/uL — ABNORMAL HIGH (ref 1.7–7.7)
Neutrophils Relative %: 83 %
Platelets: 175 10*3/uL (ref 150–400)
RBC: 3.44 MIL/uL — ABNORMAL LOW (ref 4.22–5.81)
RDW: 13.3 % (ref 11.5–15.5)
WBC: 14.2 10*3/uL — ABNORMAL HIGH (ref 4.0–10.5)
nRBC: 0 % (ref 0.0–0.2)

## 2019-08-14 LAB — BLOOD GAS, ARTERIAL
Acid-Base Excess: 14.3 mmol/L — ABNORMAL HIGH (ref 0.0–2.0)
Bicarbonate: 41.4 mmol/L — ABNORMAL HIGH (ref 20.0–28.0)
FIO2: 0.8
MECHVT: 500 mL
O2 Saturation: 96 %
PEEP: 5 cmH2O
Patient temperature: 37
RATE: 18 resp/min
pCO2 arterial: 61 mmHg — ABNORMAL HIGH (ref 32.0–48.0)
pH, Arterial: 7.44 (ref 7.350–7.450)
pO2, Arterial: 79 mmHg — ABNORMAL LOW (ref 83.0–108.0)

## 2019-08-14 LAB — GLUCOSE, CAPILLARY
Glucose-Capillary: 105 mg/dL — ABNORMAL HIGH (ref 70–99)
Glucose-Capillary: 107 mg/dL — ABNORMAL HIGH (ref 70–99)
Glucose-Capillary: 107 mg/dL — ABNORMAL HIGH (ref 70–99)
Glucose-Capillary: 114 mg/dL — ABNORMAL HIGH (ref 70–99)
Glucose-Capillary: 118 mg/dL — ABNORMAL HIGH (ref 70–99)
Glucose-Capillary: 129 mg/dL — ABNORMAL HIGH (ref 70–99)
Glucose-Capillary: 99 mg/dL (ref 70–99)

## 2019-08-14 LAB — HEPARIN LEVEL (UNFRACTIONATED)
Heparin Unfractionated: 0.77 IU/mL — ABNORMAL HIGH (ref 0.30–0.70)
Heparin Unfractionated: 1.26 IU/mL — ABNORMAL HIGH (ref 0.30–0.70)
Heparin Unfractionated: 0.4 IU/mL (ref 0.30–0.70)

## 2019-08-14 LAB — BRAIN NATRIURETIC PEPTIDE: B Natriuretic Peptide: 351 pg/mL — ABNORMAL HIGH (ref 0.0–100.0)

## 2019-08-14 LAB — MAGNESIUM: Magnesium: 1.8 mg/dL (ref 1.7–2.4)

## 2019-08-14 LAB — PHOSPHORUS: Phosphorus: 2.9 mg/dL (ref 2.5–4.6)

## 2019-08-14 MED ORDER — FUROSEMIDE 10 MG/ML IJ SOLN
40.0000 mg | INTRAMUSCULAR | Status: AC
  Administered 2019-08-14: 40 mg via INTRAVENOUS
  Filled 2019-08-14: qty 4

## 2019-08-14 MED ORDER — FUROSEMIDE 10 MG/ML IJ SOLN: 60.0000 mg | Freq: Once | INTRAMUSCULAR | Status: DC

## 2019-08-14 MED ORDER — MAGNESIUM SULFATE 2 GM/50ML IV SOLN
2.0000 g | Freq: Once | INTRAVENOUS | Status: AC
  Administered 2019-08-14: 07:00:00 2 g via INTRAVENOUS
  Filled 2019-08-14: qty 50

## 2019-08-14 NOTE — Progress Notes (Signed)
I returned pts sister Dustin [name]'s phone call, she is frustrated her phone call was not returned by physician during dayshift to receive an update.  She is concerned she was told her brother has gained 15 lbs since admission.  I informed Mrs. [name] I have ordered 40 mg iv lasix due to CXR revealing  pulmonary edema. She is upset her brother has not had lasix since admission. I informed her [name] had previously required neo-synephrine gtt due to hypotension, therefore his outpatient lasix was placed on hold.  She requested receiving daily updates regarding pt plan of care from physician to assist with her ability to discuss goals of treatment for her brother going forward.  I will inform dayshift team.  Will continue to monitor and assess pt.  Marda Stalker, [place] Pager [phone] (please enter 7 digits) PCCM Consult Pager [phone] (please enter 7 digits)

## 2019-08-14 NOTE — Progress Notes (Signed)
Follow up - Critical Care Medicine Note  Patient Details:    Dustin [name] is an 63 y.o. male smoker PTA, withhistory of COPD andchronic respiratory failure, chronic diastolic CHF, history of DVT on apixaban presented to the ER with complaints of having fever chills and fatigue.Patient's daughter went to check on him and found he was febrile and was brought to the ER. Patient admitted with acute on chronic hypoxic and hypercarbic respiratory failure failing BiPAP and ultimately being intubated and mechanically ventilated.  Lines, Airways, Drains: Airway 7.5 mm (Active)  Secured at (cm) 25 cm 08/14/19 1533  Measured From Lips 08/14/19 [place] 08/14/19 1533  Secured By [employer] 08/14/19 1533  Tube Holder Repositioned Yes 08/14/19 1533  Cuff Pressure (cm H2O) 28 cm H2O 08/14/19 1533  Site Condition Dry 08/14/19 1533     PICC Triple Lumen 19/50/93 PICC Right Basilic 41 cm 0 cm (Active)  Indication for Insertion or Continuance of Line Vasoactive infusions;Prolonged intravenous therapies 08/14/19 0800  Exposed Catheter (cm) 0 cm 08/06/19 1700  Site Assessment Clean;Dry;Intact 08/14/19 0800  Lumen #1 Status Infusing 08/14/19 0800  Lumen #2 Status Infusing 08/14/19 0800  Lumen #3 Status In-line blood sampling system in place 08/14/19 0800  Dressing Type Transparent;Occlusive 08/14/19 0800  Dressing Status Clean;Dry;Intact;Antimicrobial disc in place 08/14/19 0800  Line Care Connections checked and tightened 08/14/19 0800  Dressing Intervention Dressing changed 08/13/19 1600  Dressing Change Due 09/01/2019 08/14/19 0800     NG/OG Tube Orogastric 16 Fr. Center mouth Xray;Aucultation Documented cm marking at nare/ corner of mouth 60 cm (Active)  Cm Marking at Nare/Corner of Mouth (if applicable) 63 cm 26/71/24 1200  Site Assessment Clean;Dry;Intact 08/14/19 0800  Ongoing Placement Verification No change in cm markings or external length of tube from initial  placement;No change in respiratory status;No acute changes, not attributed to clinical condition 08/14/19 1200  Status Infusing tube feed 08/14/19 1200  Drainage Appearance Bile 2019-08-08 1122  Output (mL) 0 mL 08/06/19 0800     Urethral Catheter [name], RN Temperature probe 14 Fr. (Active)  Indication for Insertion or Continuance of Catheter Therapy based on hourly urine output monitoring and documentation for critical condition (NOT STRICT I&O) 08/14/19 0800  Site Assessment Clean;Intact;Dry 08/14/19 0800  Catheter Maintenance Bag below level of bladder;Catheter secured;Drainage bag/tubing not touching floor;Insertion date on drainage bag;No dependent loops;Seal intact 08/14/19 1200  Collection Container Standard drainage bag 08/14/19 0800  Securement Method Securing device (Describe) 08/14/19 0800  Urinary Catheter Interventions (if applicable) Unclamped 58/09/98 0400  Output (mL) 200 mL 08/14/19 1400    Anti-infectives:  Anti-infectives (From admission, onward)   Start     Dose/Rate Route Frequency Ordered Stop   08/07/19 1000  azithromycin (ZITHROMAX) tablet 500 mg     500 mg Oral Daily 08/06/19 1344 08/09/19 1035   08/06/19 1000  azithromycin (ZITHROMAX) 500 mg in sodium chloride 0.9 % 250 mL IVPB  Status:  Discontinued     500 mg 250 mL/hr over 60 Minutes Intravenous Every 24 hours 2019-08-08 1348 08/06/19 1344   2019-08-08 2000  azithromycin (ZITHROMAX) 500 mg in sodium chloride 0.9 % 250 mL IVPB  Status:  Discontinued     500 mg 250 mL/hr over 60 Minutes Intravenous Every 24 hours 08-08-19 0629 2019/08/08 0640   08-08-19 1800  cefTRIAXone (ROCEPHIN) 2 g in sodium chloride 0.9 % 100 mL IVPB     2 g 200 mL/hr over 30 Minutes Intravenous Every 24 hours August 08, 2019  [id number] 08/09/19 1900   07/23/2019 1700  doxycycline (VIBRAMYCIN) 100 mg in sodium chloride 0.9 % 250 mL IVPB  Status:  Discontinued     100 mg 125 mL/hr over 120 Minutes Intravenous Every 12 hours 08/10/2019 0640 08/01/2019 1348    08/18/2019 0430  cefTRIAXone (ROCEPHIN) 1 g in sodium chloride 0.9 % 100 mL IVPB     1 g 200 mL/hr over 30 Minutes Intravenous  Once 08/18/2019 0426 08/03/2019 0546   08/14/2019 0430  azithromycin (ZITHROMAX) 500 mg in sodium chloride 0.9 % 250 mL IVPB     500 mg 250 mL/hr over 60 Minutes Intravenous  Once 07/20/2019 0426 08/18/2019 0555      Microbiology: Results for orders placed or performed during the hospital encounter of 07/22/2019  Culture, blood (routine x 2)     Status: None   Collection Time: 07/30/2019  4:35 AM   Specimen: BLOOD  Result Value Ref Range Status   Specimen Description BLOOD RIGHT WRIST   Final   Special Requests   Final    BOTTLES DRAWN AEROBIC AND ANAEROBIC Blood Culture results may not be optimal due to an excessive volume of blood received in culture bottles   Culture   Final    NO GROWTH 5 DAYS Performed at [facility], [address] [place], [name] [address]    Report Status 08/10/2019 FINAL  Final  Culture, blood (routine x 2)     Status: None   Collection Time: 08/08/2019  4:35 AM   Specimen: BLOOD  Result Value Ref Range Status   Specimen Description BLOOD LEFT HAND  Final   Special Requests   Final    BOTTLES DRAWN AEROBIC AND ANAEROBIC Blood Culture results may not be optimal due to an excessive volume of blood received in culture bottles   Culture   Final    NO GROWTH 5 DAYS Performed at [facility], [address] [place], [name] [address]    Report Status 08/10/2019 FINAL  Final  SARS Coronavirus 2 by RT PCR (hospital order, performed in [facility] lab) Nasopharyngeal Nasopharyngeal Swab     Status: None   Collection Time: 08/01/2019  9:41 AM   Specimen: Nasopharyngeal Swab  Result Value Ref Range Status   SARS Coronavirus 2 NEGATIVE NEGATIVE Final    Comment: (NOTE) If result is NEGATIVE SARS-CoV-2 target nucleic acids are NOT DETECTED. The SARS-CoV-2 RNA is generally detectable in upper and lower  respiratory  specimens during the acute phase of infection. The lowest  concentration of SARS-CoV-2 viral copies this assay can detect is 250  copies / mL. A negative result does not preclude SARS-CoV-2 infection  and should not be used as the sole basis for treatment or other  patient management decisions.  A negative result may occur with  improper specimen collection / handling, submission of specimen other  than nasopharyngeal swab, presence of viral mutation(s) within the  areas targeted by this assay, and inadequate number of viral copies  (<250 copies / mL). A negative result must be combined with clinical  observations, patient history, and epidemiological information. If result is POSITIVE SARS-CoV-2 target nucleic acids are DETECTED. The SARS-CoV-2 RNA is generally detectable in upper and lower  respiratory specimens dur ing the acute phase of infection.  Positive  results are indicative of active infection with SARS-CoV-2.  Clinical  correlation with patient history and other diagnostic information is  necessary to determine patient infection status.  Positive results do  not rule out bacterial infection or co-infection with other viruses. If result is PRESUMPTIVE POSTIVE SARS-CoV-2 nucleic acids MAY BE PRESENT.   A presumptive positive result was obtained on the submitted specimen  and confirmed on repeat testing.  While 2019 novel coronavirus  (SARS-CoV-2) nucleic acids may be present in the submitted sample  additional confirmatory testing may be necessary for epidemiological  and / or clinical management purposes  to differentiate between  SARS-CoV-2 and other Sarbecovirus currently known to infect humans.  If clinically indicated additional testing with an alternate test  methodology [phone] is advised. The SARS-CoV-2 RNA is generally  detectable in upper and lower respiratory sp ecimens during the acute  phase of infection. The expected result is Negative. Fact Sheet for  Patients:  BoilerBrush.com.cyhttps://www.fda.gov/media/136312/download Fact Sheet for Healthcare Providers: https://pope.com/https://www.fda.gov/media/136313/download This test is not yet approved or cleared by the [place] FDA and has been authorized for detection and/or diagnosis of SARS-CoV-2 by FDA under an Emergency Use Authorization (EUA).  This EUA will remain in effect (meaning this test can be used) for the duration of the COVID-19 declaration under Section 564(b)(1) of the Act, 21 U.S.C. section 360bbb-3(b)(1), unless the authorization is terminated or revoked sooner. Performed at [facility], [address] [place], [name] [address]   MRSA PCR Screening     Status: Abnormal   Collection Time: 07/24/2019 12:11 PM   Specimen: Nasal Mucosa; Nasopharyngeal  Result Value Ref Range Status   MRSA by PCR POSITIVE (A) NEGATIVE Final    Comment:        The GeneXpert MRSA Assay (FDA approved for NASAL specimens only), is one component of a comprehensive MRSA colonization surveillance program. It is not intended to diagnose MRSA infection nor to guide or monitor treatment for MRSA infections. RESULT CALLED TO, READ BACK BY AND VERIFIED WITH: CHARLIE WAITLEY 08/04/2019 1340 KLW Performed at [facility], [address] [place], [name] [address]     Best Practice/Protocols:  VTE Prophylaxis: Heparin (drip) GI Prophylaxis: Antihistamine Continous Sedation  Events: 11/17: Intubated. Started on neo 11/18:PICC line placed.Remains intubated, FiO2 50%. On neo at 40 mcg. Sedated on 15 mcg propofol and 200 mcg fentanyl. Responds to verbal stimuli and follows commands. 11/19: Remains intubated and sedated. FiO2 55%. On neo at 22, vasopressin .03. Sedated with 25 propofol and 300 fentanyl. Unresponsive to verbal stimuli. 11/20: Pt confused and delirious when sedated weaned.Remains intubated and sedated - FiO2 50%, 3 mcg neo, .03 units vasopressin. Sedated on 20 mcg propofol, 300 mcg  fentanyl.  11/21remains on vent, failed SAT/SBT 11/22 remains vent fio2 80%, all family members updated 11/23 remains on vent, pressors, severe hypoxia,family updated 11/24 unable to wean severe hypoxia 11/26 unable to wean due to persistent severe hypoxia, updated brother and sister.  Studies: Dg Chest 2 View  Result Date: 08/04/2019 CLINICAL DATA:  Fever, shortness of breath EXAM: CHEST - 2 VIEW COMPARISON:  07/09/2019 FINDINGS: Heart is upper limits normal in size. Bibasilar opacities are again noted, similar to prior study. No visible effusions. No acute bony abnormality. IMPRESSION: Bibasilar opacities, similar to prior study. This could reflect atelectasis or infiltrates/pneumonia. Electronically Signed   By: Charlett NoseKevin  Dover M.D.   On: 07/27/2019 00:47   Dg Abdomen 1 View  Result Date: 07/26/2019 CLINICAL DATA:  OG tube placement. EXAM: ABDOMEN - 1 VIEW COMPARISON:  11/30/2015 FINDINGS: The OG tube tip is in the fundus of the stomach and could be advanced. The visualized bowel gas pattern is  normal. Markings are slightly accentuated at the lung bases. IMPRESSION: 1. OG tube tip is in the fundus of the stomach and could be advanced slightly. 2. No acute abnormalities in the abdomen. Electronically Signed   By: Francene Boyers M.D.   On: August 23, 2019 11:42   Dg Chest Port 1 View  Result Date: 08/14/2019 CLINICAL DATA:  Respiratory distress. Acute on chronic respiratory failure. EXAM: PORTABLE CHEST 1 VIEW 2:25 p.m. COMPARISON:  Chest x-rays dated 08/14/2019 at 12:24 a.m. and 08/11/2019 and CT scan of the chest dated 06/24/2019 FINDINGS: Endotracheal tube, PICC and NG tube appear in good position. Heart size and pulmonary vascularity are normal. There is increasing infiltrate in the left perihilar region. Small bilateral pleural effusions are unchanged. Faint infiltrate at the right lung base is essentially unchanged. Severe emphysematous changes in the upper lobes as demonstrated on the prior CT  scan. IMPRESSION: 1. Increasing infiltrate in the left perihilar region. 2. No change in the faint infiltrate at the right lung base. 3. Stable small bilateral pleural effusions. Electronically Signed   By: Francene Boyers M.D.   On: 08/14/2019 14:52   Dg Chest Port 1 View  Result Date: 08/14/2019 CLINICAL DATA:  Respiratory failure EXAM: PORTABLE CHEST 1 VIEW COMPARISON:  08/11/2019 FINDINGS: Support Apparatus: --Endotracheal tube: Tip just below the level of the clavicular heads. --Enteric tube:Tip and sideport are below the field of view. --Catheter(s):Right peripheral insertion approach central venous catheter tip is in the lower SVC --Other: None Unchanged bibasilar opacities. Small pleural effusions. IMPRESSION: 1. Radiographically satisfactory appearance of support apparatus. 2. Small pleural effusions and bibasilar atelectasis. Electronically Signed   By: Deatra Robinson M.D.   On: 08/14/2019 00:44   Dg Chest Port 1 View  Result Date: 08/11/2019 CLINICAL DATA:  63 year old male with respiratory failure. EXAM: PORTABLE CHEST 1 VIEW COMPARISON:  08/09/2019 and earlier. FINDINGS: Portable AP upright view at 0536 hours. Stable endotracheal tube tip between the level the clavicles and carina. Enteric tube remains in place, tip in the left upper quadrant and side hole not delineated. Right PICC line remains in place. Stable lung volumes and mediastinal contours. No pneumothorax or pulmonary edema. Chronic lung disease with emphysema. Patchy bibasilar opacity is stable. Paucity of bowel gas in the upper abdomen. Chronic upper abdominal surgical clips. IMPRESSION: 1. Stable lines and tubes. 2. Chronic lung disease with stable patchy bibasilar opacity most resembling atelectasis. 3. No new cardiopulmonary abnormality. Electronically Signed   By: Odessa Fleming M.D.   On: 08/11/2019 06:09   Dg Chest Port 1 View  Result Date: 08/09/2019 CLINICAL DATA:  Acute respiratory failure. EXAM: PORTABLE CHEST 1 VIEW  COMPARISON:  08/07/2019 FINDINGS: 0538 hours. Endotracheal tube tip is 6.7 cm above the base of the carina. The NG tube passes into the stomach although the distal tip position is not included on the film. Low volumes with vascular congestion and bibasilar atelectasis/infiltrate. Tiny bilateral pleural effusions noted. Right PICC line tip overlies the mid SVC. Telemetry leads overlie the chest. IMPRESSION: 1. Low volume film with vascular congestion and bibasilar atelectasis/infiltrate. 2. Tiny bilateral pleural effusions. 3. Support apparatus as described. Electronically Signed   By: Kennith Center M.D.   On: 08/09/2019 08:58   Dg Chest Port 1 View  Result Date: 08/07/2019 CLINICAL DATA:  Acute respiratory failure. EXAM: PORTABLE CHEST 1 VIEW COMPARISON:  Radiograph 08-23-2019. CT 06/24/2019 FINDINGS: Endotracheal tube tip 5.2 cm from the carina. Enteric tube in place with tip below the diaphragm, not included  in the field of view. Right upper extremity PICC tip in the lower SVC. Advanced emphysema with patchy bibasilar opacities, likely scarring. No pneumothorax. Blunting of the costophrenic angles, likely due to chronic hyperinflation rather than pleural effusions. IMPRESSION: 1. Advanced emphysema with patchy bibasilar opacities, favor scarring. 2. Support apparatus unchanged. Electronically Signed   By: Narda Rutherford M.D.   On: 08/07/2019 06:52   Dg Chest Portable 1 View  Result Date: 07/20/2019 CLINICAL DATA:  Hypoxia EXAM: PORTABLE CHEST 1 VIEW COMPARISON:  August 05, 2019 study obtained earlier in the day FINDINGS: Endotracheal tube tip is 4.7 cm above the carina. No pneumothorax. There is patchy airspace opacity in both lung bases with associated atelectasis and pleural effusions. Pleural effusions are larger than on study obtained earlier in the day. There appears to be bullous disease in the upper lobes, particularly on the right. Heart size is normal. There is diminished pulmonary  vascularity in the upper lobes, likely due to bullous disease. Pulmonary vascularity appears stable. No adenopathy. No bone lesions evident. IMPRESSION: Endotracheal tube as described without pneumothorax. There are new small pleural effusions bilaterally. There is patchy airspace consolidation and atelectasis in the lung bases. Probable bullous disease in the upper lobes. Stable cardiac silhouette. Electronically Signed   By: Bretta Bang III M.D.   On: 08/14/2019 10:03   [place] Ekg Site Rite  Result Date: 08/06/2019 If Site Rite image not attached, placement could not be confirmed due to current cardiac rhythm.   Consults: Palliative Care  Subjective:    Overnight Issues: Issues with volume overload, required Lasix however subsequently developed hypotension.  Required Neo-Synephrine transiently.  FiO2 requirements remain anywhere between 70 to 80%.  Objective:  Vital signs for last 24 hours: Temp:  [99.5 F (37.5 C)-100.4 F (38 C)] 99.9 F (37.7 C) (11/26 1400) Pulse Rate:  [77-120] 87 (11/26 1400) Resp:  [15-28] 18 (11/26 1400) BP: (85-146)/(51-95) 91/55 (11/26 1400) SpO2:  [90 %-95 %] 90 % (11/26 1533) FiO2 (%):  [80 %] 80 % (11/26 1533)  Hemodynamic parameters for last 24 hours:    Intake/Output from previous day: 11/25 0701 - 11/26 0700 In: 902.3 [I.V.:902.3] Out: 6450 [Urine:6450]  Intake/Output this shift: Total I/O In: 1105 [I.V.:464.7; NG/GT:600; IV Piggyback:40.2] Out: 675 [Urine:675]  Vent settings for last 24 hours: Vent Mode: PRVC FiO2 (%):  [80 %] 80 % Set Rate:  [18 bmp] 18 bmp Vt Set:  [500 mL] 500 mL PEEP:  [5 cmH20] 5 cmH20 Plateau Pressure:  [14 cmH20-16 cmH20] 16 cmH20  Physical Exam:  GENERAL: Chronically ill appearing, synchronous with the vent when sedated HEAD: Normocephalic, atraumatic.  EYES: Pupils equal, round, reactive. No scleral icterus.No scleral edema MOUTH: Orotracheally intubated, OG in place.   NECK: Supple.  Trachea midline,  no crepitus. PULMONARY: Distant breath sounds poor air movement no wheezes.  No rhonchi. CARDIOVASCULAR: S1 and S2. Regular rate and rhythm. No murmurs, rubs, or gallops.  GASTROINTESTINAL: Soft, nondistended. No masses. Positive bowel sounds.  MUSCULOSKELETAL: No swelling, clubbing, or edema.  NEUROLOGIC: Sedated, no further assessment can be made, requires heavy sedation due to ventilator asynchrony when sedation is lightened. SKIN:intact,warm,dry, multiple ecchymoses.  Assessment/Plan:   1.  Severe ACUTE ON CHRONIC hypoxic and hypercapnic respiratory failure due to exacerbation of stage IV (very severe) COPD  continue Full MV support  Wean Fio2 and PEEP as tolerated  Has not been able to tolerate weaning and suspect that he will have to be one-way extubation to comfort care  Etiology of exacerbation possible pneumonia/bronchitis aggravated by diastolic heart failure  Underlying cor pulmonale and pulmonary hypertension also make the issue more complex  2.  Acute on chronic diastolic heart failure:  Diuretics as tolerated  Difficult to manage due to coexistent COR PULMONALE/pulmonary hypertension  Tolerating diuretics very poorly continue to assess daily  3.  NEUROLOGIC  intubated and sedated  minimal sedation to achieve a RASS goal:  -1/-2  Develops asynchrony when sedation is lightened  4.  SEVERE COPD EXACERBATION  Completed IV antibiotics  Continue bronchodilators  Continue IV steroids  5.  SHOCK: Persistent shock physiology could be due to right heart failure  Pressors as needed to maintain MAP > 65  Completed antibiotics, sepsis not suspected at this point  6.  ID  Completed antibiotics  Cultures have been negative  Active infectious process not suspected   7.  NUTRITION  TF's as tolerated  Constipation protocol as indicated  8.  ENDO  will use ICU hypoglycemic\Hyperglycemia protocol if indicated  9.  ELECTROLYTES  follow labs as  needed  replace as needed  pharmacy consulted and following     Overall, patient is critically ill, prognosis is exceedingly poor due severe underlying lung disease. Patient with Multiorgan failure and at high risk for cardiac arrest and death.  Patient is DNR, palliative care consulted.    LOS: 9 days   Additional comments: I have updated the patient's brother Dustin [name] and the patient's sister Dustin [name] is POA.  Critical Care Total Time*:  C. Danice Goltz, MD [place] [facility] 08/14/2019  *Care during the described time interval was provided by me and/or other providers on the critical care team.  I have reviewed this patient's available data, including medical history, events of note, physical examination and test results as part of my evaluation.  **This note was dictated using voice recognition software/Dragon.  Despite best efforts to proofread, errors can occur which can change the meaning.  Any change was purely unintentional.

## 2019-08-14 NOTE — Progress Notes (Signed)
Updated pts sister Mauri Reading via telephone to inform her pt tolerated 40 mg iv lasix with uop over 2L post lasix administration.  I also informed Mrs. [name] pt required very low dose neo-synephrine post lasix administration for a brief period, now neo-synephrine is currently off and pts map 82.  Repeat CXR pending later today will continue to monitor and assess pt.   Marda Stalker, [place] Pager [phone] (please enter 7 digits) PCCM Consult Pager [phone] (please enter 7 digits)

## 2019-08-14 NOTE — Consult Note (Signed)
ANTICOAGULATION CONSULT NOTE   Pharmacy Consult for Heparin Dosing Indication: venous thromboembolism  Patient Measurements: Height: 5\' 10"  (177.8 cm) Weight: 245 lb 2.4 oz (111.2 kg) IBW/kg (Calculated) : 73 Heparin Dosing Weight: 93.5 kg  Vital Signs: Temp: 99.1 F (37.3 C) (11/26 2215) Temp Source: Bladder (11/26 2000) BP: 97/61 (11/26 2215) Pulse Rate: 90 (11/26 2215)  Labs: Recent Labs    08/12/19 0451 08/13/19 0431 08/14/19 0537 08/14/19 1157 08/14/19 2218  HGB 11.0* 10.8* 11.0*  --   --   HCT 35.5* 34.6* 35.4*  --   --   PLT 185 158 175  --   --   APTT 72*  --   --   --   --   HEPARINUNFRC 0.67 0.65 0.77* 1.26* 0.40  CREATININE 0.71 0.44*  --   --   --     Estimated Creatinine Clearance: 115 mL/min (A) (by C-G formula based on SCr of 0.44 mg/dL (L)).  Assessment: Dustin [name] is a 89 YOM admitted to the ICU on 08/06/2019 with respiratory failure requiring intubation. 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 Pharmacy has been consulted for heparin dosing. H&H, PLT stable  Patient received dose of apixaban 5 mg PO @ 5003 11/17.   Goal of Therapy:  - aPTT 66-102 seconds - Monitor platelets by anticoagulation protocol: Yes.   Plan:   Heparin level 1.26 at 1157 (supratherapeutic): hold heparin for one hour then decrease rate to 1100 units/hr  Heparin level at 2218 = 0.40, therapeutic x 1, continue current heparin rate  Re-check heparin level tomorrow am  CBC in am  Will continue pt on current rate and recheck HL on 11/27 with AM labs.   Dustin [name] A Clinical Pharmacist 08/14/2019,10:56 PM

## 2019-08-14 NOTE — Consult Note (Signed)
ANTICOAGULATION CONSULT NOTE   Pharmacy Consult for Heparin Dosing Indication: venous thromboembolism  Patient Measurements: Height: 5\' 10"  (177.8 cm) Weight: 245 lb 2.4 oz (111.2 kg) IBW/kg (Calculated) : 73 Heparin Dosing Weight: 93.5 kg  Vital Signs: Temp: 99.7 F (37.6 C) (11/26 0500) BP: 95/59 (11/26 0500) Pulse Rate: 99 (11/26 0500)  Labs: Recent Labs    08/12/19 0451 08/13/19 0431 08/14/19 0537  HGB 11.0* 10.8* 11.0*  HCT 35.5* 34.6* 35.4*  PLT 185 158 175  APTT 72*  --   --   HEPARINUNFRC 0.67 0.65 0.77*  CREATININE 0.71 0.44*  --     Estimated Creatinine Clearance: 115 mL/min (A) (by C-G formula based on SCr of 0.44 mg/dL (L)).  Assessment: Dustin [name] is a 89 YOM admitted to the ICU on 08/06/2019 with respiratory failure requiring intubation. 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 Pharmacy has been consulted for heparin dosing.  Patient received dose of apixaban 5 mg PO @ 3536 11/17.   Goal of Therapy:  - aPTT 66-102 seconds - Monitor platelets by anticoagulation protocol: Yes.   Plan:  11/22 @ 0229 aPTT 66 seconds therapeutic, HL 0.64 correlating but barely. Will continue current rate and will recheck aPTT/HL w/ am labs, CBC stable will continue to monitor.  11/24 @ 0451:   APTT = 72, HL = 0.67 Since both levels are therapeutic X 2 , we can now use HL to dose heparin.   Will continue this pt on current rate and recheck HL on 11/25 with AM labs.   11/25:  HL @ 0431 = 0.65 11/26: HL @ 0537 = 0.77, SUPRAtherapeutic, will decrease to 1400 units/hr and recheck in 6 hrs  Will continue pt on current rate and recheck HL on 11/26 with AM labs.   [name] A Clinical Pharmacist 08/14/2019,6:05 AM

## 2019-08-14 NOTE — Consult Note (Signed)
ANTICOAGULATION CONSULT NOTE   Pharmacy Consult for Heparin Dosing Indication: venous thromboembolism  Patient Measurements: Height: 5\' 10"  (177.8 cm) Weight: 245 lb 2.4 oz (111.2 kg) IBW/kg (Calculated) : 73 Heparin Dosing Weight: 93.5 kg  Vital Signs: Temp: 99.5 F (37.5 C) (11/26 0600) BP: 114/65 (11/26 0600) Pulse Rate: 99 (11/26 0600)  Labs: Recent Labs    08/12/19 0451 08/13/19 0431 08/14/19 0537  HGB 11.0* 10.8* 11.0*  HCT 35.5* 34.6* 35.4*  PLT 185 158 175  APTT 72*  --   --   HEPARINUNFRC 0.67 0.65 0.77*  CREATININE 0.71 0.44*  --     Estimated Creatinine Clearance: 115 mL/min (A) (by C-G formula based on SCr of 0.44 mg/dL (L)).  Assessment: Dustin [name] is a 40 YOM admitted to the ICU on 06-Aug-2019 with respiratory failure requiring intubation. 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 Pharmacy has been consulted for heparin dosing. H&H, PLT stable  Patient received dose of apixaban 5 mg PO @ 6283 11/17.   Goal of Therapy:  - aPTT 66-102 seconds - Monitor platelets by anticoagulation protocol: Yes.   Plan:   Heparin level 1.26 at 1157 (supratherapeutic): hold heparin for one hour then decrease rate to 1100 units/hr  Re-check heparin level 6 hours after rate change  CBC in am  Will continue pt on current rate and recheck HL on 11/26 with AM labs.   [place] Pharmacist 08/14/2019,7:13 AM

## 2019-08-14 NOTE — Progress Notes (Signed)
Pharmacy Electrolyte Monitoring Consult:  Dustin [name]. Dustin [name] is a 41 YOM admitted to the ICU on 08/08/2019 with respiratory failure requiring intubation from COPD, CHF, and possible PNA. 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  Furosemide was discontinued on admission secondary to vasopressor requirements. Patient is on tube feeds (VITAL HIGH PROTEIN 1,000 mL at 20 mL/hr and PRO-STAT 60 mL QID).   Labs:  Sodium (mmol/L)  Date Value  08/13/2019 140  04/01/2019 145 (H)  07/23/2014 139   Potassium (mmol/L)  Date Value  08/13/2019 4.7  07/23/2014 4.1   Magnesium (mg/dL)  Date Value  08/14/2019 1.8   Phosphorus (mg/dL)  Date Value  08/14/2019 2.9   Calcium (mg/dL)  Date Value  08/13/2019 8.2 (L)   Calcium, Total (mg/dL)  Date Value  07/23/2014 8.4 (L)   Albumin (g/dL)  Date Value  08/06/2019 3.1 (L)  07/23/2014 2.8 (L)    Corrected Ca: 8.9 mg/dL  Assessment/Plan:  Electrolytes:  - Continue free water flushes at 11mL q4h.  - magnesium replaced with 2 grams IV magnesium sulfate - BMP, magnesium in am  Goals of Therapy: - Potassium ~ 4 - Magnesium ~ 2  - All other electrolytes within normal limits  Glucose:  - Patient is on 0-9 units q4h SSI. - Patient requiring 0 units over past 24 hours (BG 110-122).  - Will continue to monitor and consult with diabetes team as necessary.  Constipation:  - No documented bowel movement since admission.  - Patient receiving Senokot-S 2 tablets VT BID and MiraLAX 17g VT daily. - Lactulose 10g VT TID ordered x 9 doses on 11/24. Will consider increasing dose if patient continues without bowel movement. May also consider enema if no bowel movement despite lactulose. - He is on fentanyl 250 mcg/hr for pain management.  Pharmacy will continue to monitor and adjust per consult.   Dallie Piles, PharmD 08/14/2019 7:03 AM

## 2019-08-15 DIAGNOSIS — J181 Lobar pneumonia, unspecified organism: Secondary | ICD-10-CM

## 2019-08-15 DIAGNOSIS — R000 Tachycardia, unspecified: Secondary | ICD-10-CM

## 2019-08-15 LAB — GLUCOSE, CAPILLARY
Glucose-Capillary: 108 mg/dL — ABNORMAL HIGH (ref 70–99)
Glucose-Capillary: 108 mg/dL — ABNORMAL HIGH (ref 70–99)
Glucose-Capillary: 109 mg/dL — ABNORMAL HIGH (ref 70–99)
Glucose-Capillary: 122 mg/dL — ABNORMAL HIGH (ref 70–99)
Glucose-Capillary: 122 mg/dL — ABNORMAL HIGH (ref 70–99)
Glucose-Capillary: 134 mg/dL — ABNORMAL HIGH (ref 70–99)

## 2019-08-15 LAB — CBC
HCT: 34.9 % — ABNORMAL LOW (ref 39.0–52.0)
Hemoglobin: 10.8 g/dL — ABNORMAL LOW (ref 13.0–17.0)
MCH: 31.6 pg (ref 26.0–34.0)
MCHC: 30.9 g/dL (ref 30.0–36.0)
MCV: 102 fL — ABNORMAL HIGH (ref 80.0–100.0)
Platelets: 150 10*3/uL (ref 150–400)
RBC: 3.42 MIL/uL — ABNORMAL LOW (ref 4.22–5.81)
RDW: 13 % (ref 11.5–15.5)
WBC: 12 10*3/uL — ABNORMAL HIGH (ref 4.0–10.5)
nRBC: 0 % (ref 0.0–0.2)

## 2019-08-15 LAB — HEPARIN LEVEL (UNFRACTIONATED)
Heparin Unfractionated: 0.32 IU/mL (ref 0.30–0.70)
Heparin Unfractionated: 0.37 IU/mL (ref 0.30–0.70)

## 2019-08-15 LAB — BASIC METABOLIC PANEL
Anion gap: 9 (ref 5–15)
BUN: 42 mg/dL — ABNORMAL HIGH (ref 8–23)
CO2: 35 mmol/L — ABNORMAL HIGH (ref 22–32)
Calcium: 8.7 mg/dL — ABNORMAL LOW (ref 8.9–10.3)
Chloride: 95 mmol/L — ABNORMAL LOW (ref 98–111)
Creatinine, Ser: 0.53 mg/dL — ABNORMAL LOW (ref 0.61–1.24)
GFR calc Af Amer: >60 mL/min (ref >60)
GFR calc non Af Amer: >60 mL/min (ref >60)
Glucose, Bld: 142 mg/dL — ABNORMAL HIGH (ref 70–99)
Potassium: 4.3 mmol/L (ref 3.5–5.1)
Sodium: 139 mmol/L (ref 135–145)

## 2019-08-15 LAB — MAGNESIUM: Magnesium: 2.4 mg/dL (ref 1.7–2.4)

## 2019-08-15 LAB — BRAIN NATRIURETIC PEPTIDE: B Natriuretic Peptide: 296 pg/mL — ABNORMAL HIGH (ref 0.0–100.0)

## 2019-08-15 LAB — ALBUMIN: Albumin: 2.7 g/dL — ABNORMAL LOW (ref 3.5–5.0)

## 2019-08-15 MED ORDER — MIDAZOLAM HCL 2 MG/2ML IJ SOLN
2.0000 mg | INTRAMUSCULAR | Status: DC | PRN
  Administered 2019-08-16: 4 mg via INTRAVENOUS
  Administered 2019-08-16 (×4): 2 mg via INTRAVENOUS
  Administered 2019-08-17 – 2019-08-18 (×4): 4 mg via INTRAVENOUS
  Filled 2019-08-15: qty 2
  Filled 2019-08-15 (×3): qty 4
  Filled 2019-08-15: qty 2
  Filled 2019-08-15 (×2): qty 4
  Filled 2019-08-15 (×2): qty 2

## 2019-08-15 MED ORDER — PIPERACILLIN-TAZOBACTAM 3.375 G IVPB
3.3750 g | Freq: Three times a day (TID) | INTRAVENOUS | Status: DC
  Administered 2019-08-15 – 2019-08-18 (×9): 3.375 g via INTRAVENOUS
  Filled 2019-08-15 (×9): qty 50

## 2019-08-15 MED ORDER — ENOXAPARIN SODIUM 120 MG/0.8ML ~~LOC~~ SOLN
110.0000 mg | Freq: Two times a day (BID) | SUBCUTANEOUS | Status: DC
  Administered 2019-08-15 – 2019-08-17 (×4): 110 mg via SUBCUTANEOUS
  Filled 2019-08-15 (×5): qty 0.8

## 2019-08-15 MED ORDER — SENNOSIDES-DOCUSATE SODIUM 8.6-50 MG PO TABS
1.0000 | ORAL_TABLET | Freq: Two times a day (BID) | ORAL | Status: DC
  Administered 2019-08-16 – 2019-08-18 (×6): 1
  Filled 2019-08-15 (×7): qty 1

## 2019-08-15 MED ORDER — PIPERACILLIN-TAZOBACTAM 3.375 G IVPB: 3.3750 g | Freq: Three times a day (TID) | INTRAVENOUS | Status: DC

## 2019-08-15 NOTE — Progress Notes (Signed)
Sister updated via phone, all questions answered. Phone held to pt's ear to facilitate communication with sister.

## 2019-08-15 NOTE — Progress Notes (Signed)
Pharmacy Electrolyte Monitoring Consult:  Dustin [name] is a 30 YOM admitted to the ICU on 2019-08-12 with respiratory failure requiring intubation from COPD, CHF, and possible PNA. 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  Patient is on tube feeds (VITAL HIGH PROTEIN 1,000 mL at 20 mL/hr and PRO-STAT 60 mL QID).   Labs:  Sodium (mmol/L)  Date Value  08/15/2019 139  04/01/2019 145 (H)  07/23/2014 139   Potassium (mmol/L)  Date Value  08/15/2019 4.3  07/23/2014 4.1   Magnesium (mg/dL)  Date Value  08/15/2019 2.4   Phosphorus (mg/dL)  Date Value  08/14/2019 2.9   Calcium (mg/dL)  Date Value  08/15/2019 8.7 (L)   Calcium, Total (mg/dL)  Date Value  07/23/2014 8.4 (L)   Albumin (g/dL)  Date Value  08/06/2019 3.1 (L)  07/23/2014 2.8 (L)    Corrected Ca: 8.9 mg/dL  Assessment/Plan:  Electrolytes:  - Sodium continuing to trend down, will resume free water flushes per protocol.  - No further replacement warranted - BMP, magnesium with am labs on 11/29.  Goals of Therapy: - Potassium ~ 4 - Magnesium ~ 2  - All other electrolytes within normal limits  Glucose:  - Patient is on 0-9 units q4h SSI. - Patient requiring 2 units over past 24 hours  - Will continue to monitor and consult with diabetes team as necessary.  Constipation:  - Patient with multiple bowel movements.  - D/C lactulose. Hold senna/docusate on 11/27 and resume 1 tab BID on 11/28.  - He is on fentanyl 300 mcg/hr for pain management.  Pharmacy will continue to monitor and adjust per consult.   Jazlin Tapscott L, RPh 08/15/2019 10:17 AM

## 2019-08-15 NOTE — Progress Notes (Signed)
Follow up - Critical Care Medicine Note  Patient Details:    Dustin [name] is an 63 y.o. male smoker PTA, withhistory of COPD andchronic respiratory failure, chronic diastolic CHF, history of DVT on apixaban presented to the ER with complaints of having fever chills and fatigue.Patient's daughter went to check on him and found he was febrile and was brought to the ER. Patient admitted with acute on chronic hypoxic and hypercarbic respiratory failure failing BiPAP and ultimately being intubated and mechanically ventilated.  Lines, Airways, Drains: Airway 7.5 mm (Active)  Secured at (cm) 25 cm 08/14/19 1533  Measured From Lips 08/14/19 [place] 08/14/19 1533  Secured By [employer] 08/14/19 1533  Tube Holder Repositioned Yes 08/14/19 1533  Cuff Pressure (cm H2O) 28 cm H2O 08/14/19 1533  Site Condition Dry 08/14/19 1533     PICC Triple Lumen 87/56/43 PICC Right Basilic 41 cm 0 cm (Active)  Indication for Insertion or Continuance of Line Vasoactive infusions;Prolonged intravenous therapies 08/14/19 0800  Exposed Catheter (cm) 0 cm 08/06/19 1700  Site Assessment Clean;Dry;Intact 08/14/19 0800  Lumen #1 Status Infusing 08/14/19 0800  Lumen #2 Status Infusing 08/14/19 0800  Lumen #3 Status In-line blood sampling system in place 08/14/19 0800  Dressing Type Transparent;Occlusive 08/14/19 0800  Dressing Status Clean;Dry;Intact;Antimicrobial disc in place 08/14/19 0800  Line Care Connections checked and tightened 08/14/19 0800  Dressing Intervention Dressing changed 08/13/19 1600  Dressing Change Due 09/08/2019 08/14/19 0800     NG/OG Tube Orogastric 16 Fr. Center mouth Xray;Aucultation Documented cm marking at nare/ corner of mouth 60 cm (Active)  Cm Marking at Nare/Corner of Mouth (if applicable) 63 cm 32/95/18 1200  Site Assessment Clean;Dry;Intact 08/14/19 0800  Ongoing Placement Verification No change in cm markings or external length of tube from initial  placement;No change in respiratory status;No acute changes, not attributed to clinical condition 08/14/19 1200  Status Infusing tube feed 08/14/19 1200  Drainage Appearance Bile 07/31/2019 1122  Output (mL) 0 mL 08/06/19 0800     Urethral Catheter [name], RN Temperature probe 14 Fr. (Active)  Indication for Insertion or Continuance of Catheter Therapy based on hourly urine output monitoring and documentation for critical condition (NOT STRICT I&O) 08/14/19 0800  Site Assessment Clean;Intact;Dry 08/14/19 0800  Catheter Maintenance Bag below level of bladder;Catheter secured;Drainage bag/tubing not touching floor;Insertion date on drainage bag;No dependent loops;Seal intact 08/14/19 1200  Collection Container Standard drainage bag 08/14/19 0800  Securement Method Securing device (Describe) 08/14/19 0800  Urinary Catheter Interventions (if applicable) Unclamped 84/16/60 0400  Output (mL) 200 mL 08/14/19 1400    Anti-infectives:  Anti-infectives (From admission, onward)   Start     Dose/Rate Route Frequency Ordered Stop   08/07/19 1000  azithromycin (ZITHROMAX) tablet 500 mg     500 mg Oral Daily 08/06/19 1344 08/09/19 1035   08/06/19 1000  azithromycin (ZITHROMAX) 500 mg in sodium chloride 0.9 % 250 mL IVPB  Status:  Discontinued     500 mg 250 mL/hr over 60 Minutes Intravenous Every 24 hours 07/21/2019 1348 08/06/19 1344   08/09/2019 2000  azithromycin (ZITHROMAX) 500 mg in sodium chloride 0.9 % 250 mL IVPB  Status:  Discontinued     500 mg 250 mL/hr over 60 Minutes Intravenous Every 24 hours 07/29/2019 0629 08/17/2019 0640   07/23/2019 1800  cefTRIAXone (ROCEPHIN) 2 g in sodium chloride 0.9 % 100 mL IVPB     2 g 200 mL/hr over 30 Minutes Intravenous Every 24 hours 07/25/2019  [id number] 08/09/19 1900   07/23/2019 1700  doxycycline (VIBRAMYCIN) 100 mg in sodium chloride 0.9 % 250 mL IVPB  Status:  Discontinued     100 mg 125 mL/hr over 120 Minutes Intravenous Every 12 hours 08/10/2019 0640 08/01/2019 1348    08/18/2019 0430  cefTRIAXone (ROCEPHIN) 1 g in sodium chloride 0.9 % 100 mL IVPB     1 g 200 mL/hr over 30 Minutes Intravenous  Once 08/18/2019 0426 08/03/2019 0546   08/14/2019 0430  azithromycin (ZITHROMAX) 500 mg in sodium chloride 0.9 % 250 mL IVPB     500 mg 250 mL/hr over 60 Minutes Intravenous  Once 07/20/2019 0426 08/18/2019 0555      Microbiology: Results for orders placed or performed during the hospital encounter of 07/22/2019  Culture, blood (routine x 2)     Status: None   Collection Time: 07/30/2019  4:35 AM   Specimen: BLOOD  Result Value Ref Range Status   Specimen Description BLOOD RIGHT WRIST   Final   Special Requests   Final    BOTTLES DRAWN AEROBIC AND ANAEROBIC Blood Culture results may not be optimal due to an excessive volume of blood received in culture bottles   Culture   Final    NO GROWTH 5 DAYS Performed at [facility], [address] [place], [name] [address]    Report Status 08/10/2019 FINAL  Final  Culture, blood (routine x 2)     Status: None   Collection Time: 08/08/2019  4:35 AM   Specimen: BLOOD  Result Value Ref Range Status   Specimen Description BLOOD LEFT HAND  Final   Special Requests   Final    BOTTLES DRAWN AEROBIC AND ANAEROBIC Blood Culture results may not be optimal due to an excessive volume of blood received in culture bottles   Culture   Final    NO GROWTH 5 DAYS Performed at [facility], [address] [place], [name] [address]    Report Status 08/10/2019 FINAL  Final  SARS Coronavirus 2 by RT PCR (hospital order, performed in [facility] lab) Nasopharyngeal Nasopharyngeal Swab     Status: None   Collection Time: 08/01/2019  9:41 AM   Specimen: Nasopharyngeal Swab  Result Value Ref Range Status   SARS Coronavirus 2 NEGATIVE NEGATIVE Final    Comment: (NOTE) If result is NEGATIVE SARS-CoV-2 target nucleic acids are NOT DETECTED. The SARS-CoV-2 RNA is generally detectable in upper and lower  respiratory  specimens during the acute phase of infection. The lowest  concentration of SARS-CoV-2 viral copies this assay can detect is 250  copies / mL. A negative result does not preclude SARS-CoV-2 infection  and should not be used as the sole basis for treatment or other  patient management decisions.  A negative result may occur with  improper specimen collection / handling, submission of specimen other  than nasopharyngeal swab, presence of viral mutation(s) within the  areas targeted by this assay, and inadequate number of viral copies  (<250 copies / mL). A negative result must be combined with clinical  observations, patient history, and epidemiological information. If result is POSITIVE SARS-CoV-2 target nucleic acids are DETECTED. The SARS-CoV-2 RNA is generally detectable in upper and lower  respiratory specimens dur ing the acute phase of infection.  Positive  results are indicative of active infection with SARS-CoV-2.  Clinical  correlation with patient history and other diagnostic information is  necessary to determine patient infection status.  Positive results do  not rule out bacterial infection or co-infection with other viruses. If result is PRESUMPTIVE POSTIVE SARS-CoV-2 nucleic acids MAY BE PRESENT.   A presumptive positive result was obtained on the submitted specimen  and confirmed on repeat testing.  While 2019 novel coronavirus  (SARS-CoV-2) nucleic acids may be present in the submitted sample  additional confirmatory testing may be necessary for epidemiological  and / or clinical management purposes  to differentiate between  SARS-CoV-2 and other Sarbecovirus currently known to infect humans.  If clinically indicated additional testing with an alternate test  methodology [phone]) is advised. The SARS-CoV-2 RNA is generally  detectable in upper and lower respiratory sp ecimens during the acute  phase of infection. The expected result is Negative. Fact Sheet for  Patients:  BoilerBrush.com.cy Fact Sheet for Healthcare Providers: https://pope.com/ This test is not yet approved or cleared by the [place] FDA and has been authorized for detection and/or diagnosis of SARS-CoV-2 by FDA under an Emergency Use Authorization (EUA).  This EUA will remain in effect (meaning this test can be used) for the duration of the COVID-19 declaration under Section 564(b)(1) of the Act, 21 U.S.C. section 360bbb-3(b)(1), unless the authorization is terminated or revoked sooner. Performed at [facility], [address] [place], [place] [address]   MRSA PCR Screening     Status: Abnormal   Collection Time: Aug 31, 2019 12:11 PM   Specimen: Nasal Mucosa; Nasopharyngeal  Result Value Ref Range Status   MRSA by PCR POSITIVE (A) NEGATIVE Final    Comment:        The GeneXpert MRSA Assay (FDA approved for NASAL specimens only), is one component of a comprehensive MRSA colonization surveillance program. It is not intended to diagnose MRSA infection nor to guide or monitor treatment for MRSA infections. RESULT CALLED TO, READ BACK BY AND VERIFIED WITH: CHARLIE WAITLEY 08-31-19 1340 KLW Performed at [facility], [address] [place], [place] [address]     Best Practice/Protocols:  VTE Prophylaxis: Lovenox (full dose) GI Prophylaxis: Antihistamine Continous Sedation  Events: 11/17: Intubated. Started on neo 11/18:PICC line placed.Remains intubated, FiO2 50%. On neo at 40 mcg. Sedated on 15 mcg propofol and 200 mcg fentanyl. Responds to verbal stimuli and follows commands. 11/19: Remains intubated and sedated. FiO2 55%. On neo at 22, vasopressin .03. Sedated with 25 propofol and 300 fentanyl. Unresponsive to verbal stimuli. 11/20: Pt confused and delirious when sedated weaned.Remains intubated and sedated - FiO2 50%, 3 mcg neo, .03 units vasopressin. Sedated on 20 mcg propofol, 300 mcg  fentanyl.  11/21remains on vent, failed SAT/SBT 11/22 remains vent fio2 80%, all family members updated 11/23 remains on vent, pressors, severe hypoxia,family updated 11/24 unable to wean severe hypoxia 11/26 unable to wean due to persistent severe hypoxia, updated brother and sister 11/27 thick purulent secretions, fever, increase FiO2 to 90%, antibiotics were started: Zosyn  Studies: Dg Chest 2 View  Result Date: 08/31/2019 CLINICAL DATA:  Fever, shortness of breath EXAM: CHEST - 2 VIEW COMPARISON:  07/09/2019 FINDINGS: Heart is upper limits normal in size. Bibasilar opacities are again noted, similar to prior study. No visible effusions. No acute bony abnormality. IMPRESSION: Bibasilar opacities, similar to prior study. This could reflect atelectasis or infiltrates/pneumonia. Electronically Signed   By: Charlett Nose M.D.   On: Aug 31, 2019 00:47   Dg Abdomen 1 View  Result Date: 08-31-19 CLINICAL DATA:  OG tube placement. EXAM: ABDOMEN - 1 VIEW COMPARISON:  11/30/2015 FINDINGS: The OG tube tip is in the  fundus of the stomach and could be advanced. The visualized bowel gas pattern is normal. Markings are slightly accentuated at the lung bases. IMPRESSION: 1. OG tube tip is in the fundus of the stomach and could be advanced slightly. 2. No acute abnormalities in the abdomen. Electronically Signed   By: Francene Boyers M.D.   On: 07/29/2019 11:42   Dg Chest Port 1 View  Result Date: 08/14/2019 CLINICAL DATA:  Respiratory distress. Acute on chronic respiratory failure. EXAM: PORTABLE CHEST 1 VIEW 2:25 p.m. COMPARISON:  Chest x-rays dated 08/14/2019 at 12:24 a.m. and 08/11/2019 and CT scan of the chest dated 06/24/2019 FINDINGS: Endotracheal tube, PICC and NG tube appear in good position. Heart size and pulmonary vascularity are normal. There is increasing infiltrate in the left perihilar region. Small bilateral pleural effusions are unchanged. Faint infiltrate at the right lung base is  essentially unchanged. Severe emphysematous changes in the upper lobes as demonstrated on the prior CT scan. IMPRESSION: 1. Increasing infiltrate in the left perihilar region. 2. No change in the faint infiltrate at the right lung base. 3. Stable small bilateral pleural effusions. Electronically Signed   By: Francene Boyers M.D.   On: 08/14/2019 14:52   Dg Chest Port 1 View  Result Date: 08/14/2019 CLINICAL DATA:  Respiratory failure EXAM: PORTABLE CHEST 1 VIEW COMPARISON:  08/11/2019 FINDINGS: Support Apparatus: --Endotracheal tube: Tip just below the level of the clavicular heads. --Enteric tube:Tip and sideport are below the field of view. --Catheter(s):Right peripheral insertion approach central venous catheter tip is in the lower SVC --Other: None Unchanged bibasilar opacities. Small pleural effusions. IMPRESSION: 1. Radiographically satisfactory appearance of support apparatus. 2. Small pleural effusions and bibasilar atelectasis. Electronically Signed   By: Deatra Robinson M.D.   On: 08/14/2019 00:44   Dg Chest Port 1 View  Result Date: 08/11/2019 CLINICAL DATA:  63 year old male with respiratory failure. EXAM: PORTABLE CHEST 1 VIEW COMPARISON:  08/09/2019 and earlier. FINDINGS: Portable AP upright view at 0536 hours. Stable endotracheal tube tip between the level the clavicles and carina. Enteric tube remains in place, tip in the left upper quadrant and side hole not delineated. Right PICC line remains in place. Stable lung volumes and mediastinal contours. No pneumothorax or pulmonary edema. Chronic lung disease with emphysema. Patchy bibasilar opacity is stable. Paucity of bowel gas in the upper abdomen. Chronic upper abdominal surgical clips. IMPRESSION: 1. Stable lines and tubes. 2. Chronic lung disease with stable patchy bibasilar opacity most resembling atelectasis. 3. No new cardiopulmonary abnormality. Electronically Signed   By: Odessa Fleming M.D.   On: 08/11/2019 06:09   Dg Chest Port 1  View  Result Date: 08/09/2019 CLINICAL DATA:  Acute respiratory failure. EXAM: PORTABLE CHEST 1 VIEW COMPARISON:  08/07/2019 FINDINGS: 0538 hours. Endotracheal tube tip is 6.7 cm above the base of the carina. The NG tube passes into the stomach although the distal tip position is not included on the film. Low volumes with vascular congestion and bibasilar atelectasis/infiltrate. Tiny bilateral pleural effusions noted. Right PICC line tip overlies the mid SVC. Telemetry leads overlie the chest. IMPRESSION: 1. Low volume film with vascular congestion and bibasilar atelectasis/infiltrate. 2. Tiny bilateral pleural effusions. 3. Support apparatus as described. Electronically Signed   By: Kennith Center M.D.   On: 08/09/2019 08:58   Dg Chest Port 1 View  Result Date: 08/07/2019 CLINICAL DATA:  Acute respiratory failure. EXAM: PORTABLE CHEST 1 VIEW COMPARISON:  Radiograph 08/08/2019. CT 06/24/2019 FINDINGS: Endotracheal tube tip 5.2 cm  from the carina. Enteric tube in place with tip below the diaphragm, not included in the field of view. Right upper extremity PICC tip in the lower SVC. Advanced emphysema with patchy bibasilar opacities, likely scarring. No pneumothorax. Blunting of the costophrenic angles, likely due to chronic hyperinflation rather than pleural effusions. IMPRESSION: 1. Advanced emphysema with patchy bibasilar opacities, favor scarring. 2. Support apparatus unchanged. Electronically Signed   By: Narda Rutherford M.D.   On: 08/07/2019 06:52   Dg Chest Portable 1 View  Result Date: August 24, 2019 CLINICAL DATA:  Hypoxia EXAM: PORTABLE CHEST 1 VIEW COMPARISON:  08-24-2019 study obtained earlier in the day FINDINGS: Endotracheal tube tip is 4.7 cm above the carina. No pneumothorax. There is patchy airspace opacity in both lung bases with associated atelectasis and pleural effusions. Pleural effusions are larger than on study obtained earlier in the day. There appears to be bullous disease in  the upper lobes, particularly on the right. Heart size is normal. There is diminished pulmonary vascularity in the upper lobes, likely due to bullous disease. Pulmonary vascularity appears stable. No adenopathy. No bone lesions evident. IMPRESSION: Endotracheal tube as described without pneumothorax. There are new small pleural effusions bilaterally. There is patchy airspace consolidation and atelectasis in the lung bases. Probable bullous disease in the upper lobes. Stable cardiac silhouette. Electronically Signed   By: Bretta Bang III M.D.   On: 08/24/2019 10:03   [place] Ekg Site Rite  Result Date: 08/06/2019 If Site Rite image not attached, placement could not be confirmed due to current cardiac rhythm.   Consults: Palliative Care  Subjective:    Overnight Issues: FiO2 up to 90%, purulent secretions, urine culture.  Start Zosyn again.  Objective:  Vital signs for last 24 hours: Temp:  [98.4 F (36.9 C)-100.4 F (38 C)] 99.5 F (37.5 C) (11/27 1300) Pulse Rate:  [83-136] 126 (11/27 1300) Resp:  [13-24] 20 (11/27 1300) BP: (92-163)/(53-92) 143/81 (11/27 1300) SpO2:  [90 %-96 %] 92 % (11/27 1300) FiO2 (%):  [80 %-90 %] 90 % (11/27 1123)  Hemodynamic parameters for last 24 hours:    Intake/Output from previous day: 11/26 0701 - 11/27 0700 In: 2210.8 [I.V.:1190.5; NG/GT:980; IV Piggyback:40.2] Out: 2100 [Urine:2100]  Intake/Output this shift: Total I/O In: 510.7 [I.V.:250.7; NG/GT:260] Out: 310 [Urine:310]  Vent settings for last 24 hours: Vent Mode: PRVC FiO2 (%):  [80 %-90 %] 90 % Set Rate:  [18 bmp] 18 bmp Vt Set:  [500 mL] 500 mL PEEP:  [5 cmH20] 5 cmH20 Plateau Pressure:  [12 cmH20-20 cmH20] 12 cmH20  Physical Exam:  GENERAL: Chronically ill appearing, synchronous with the vent when sedated HEAD: Normocephalic, atraumatic.  EYES: Pupils equal, round, reactive. No scleral icterus.No scleral edema MOUTH: Orotracheally intubated, OG in place.   NECK: Supple.   Trachea midline, no crepitus. PULMONARY: Distant breath sounds poor air movement no wheezes.  Few rhonchi over the lower lung zones. CARDIOVASCULAR: S1 and S2. Regular rate and rhythm. No murmurs, rubs, or gallops.  GASTROINTESTINAL: Soft, nondistended. No masses. Positive bowel sounds.  MUSCULOSKELETAL: No swelling, clubbing, 2+ anasarca  NEUROLOGIC: Sedated, no further assessment can be made, requires heavy sedation due to increased FiO2 requirements and ventilator asynchrony. SKIN:intact,warm,dry, multiple ecchymoses.  Assessment/Plan:   Severe ACUTE ON CHRONIC hypoxic and hypercapnic respiratory failure due to exacerbation of stage IV (very severe) COPD  continue Full MV support  Wean Fio2 and PEEP as tolerated  Has not been able to tolerate weaning and suspect that he  will have to be one-way extubation to comfort care  Etiology of exacerbation possible pneumonia/bronchitis aggravated by diastolic heart failure  Change in sputum character today, recultured, antibiotics restarted  Underlying cor pulmonale and pulmonary hypertension also make the issue more complex  Acute on chronic diastolic heart failure:  Diuretics as tolerated, has not been able to tolerate due to hypotension  Difficult to manage due to coexistent COR PULMONALE/pulmonary hypertension  Tolerating diuretics very poorly continue to assess daily  NEUROLOGIC  intubated and sedated  minimal sedation to achieve a RASS goal:  -1/-2  Develops asynchrony when sedation is lightened  SEVERE COPD EXACERBATION  Completed IV antibiotics, had to restart today due to change in sputum character  Continue bronchodilators  Continue IV steroids  SHOCK: Persistent shock physiology could be due to right heart failure  Pressors as needed to maintain MAP > 65  Acute on chronic kidney injury  Avoid nephrotoxins  Follow renal panel  No indication for dialysis  Likely ATN  ID  Resumed antibiotic today:  Zosyn  Cultured sputum due to change in character of creations  Potential pneumonia  Follow-up chest x-ray in the morning   Moderate protein calorie malnutrition   TF's as tolerated  Constipation protocol as indicated  Third spacing will aggravate volume issues  ENDO  will use ICU hypoglycemic\Hyperglycemia protocol if indicated  ELECTROLYTES  follow labs as needed  replace as needed  pharmacy consulted and following   Overall, patient is critically ill, prognosis is exceedingly poor due severe underlying lung disease. Patient with multiorgan failure and at high risk for cardiac arrest and death.  Patient is DNR, palliative care consulted.    LOS: 10 days   Additional comments: We will meet with family tomorrow  Critical Care Total Time*: 45 Minutes  C. Danice Goltz, MD [facility] 08/15/2019  *Care during the described time interval was provided by me and/or other providers on the critical care team.  I have reviewed this patient's available data, including medical history, events of note, physical examination and test results as part of my evaluation.  **This note was dictated using voice recognition software/Dragon.  Despite best efforts to proofread, errors can occur which can change the meaning.  Any change was purely unintentional.

## 2019-08-15 NOTE — Progress Notes (Signed)
PALLIATIVE Note:  Patient assessed at bedside. Assisted staff with bathing patient. Excessive secretions requiring suction. Per RN patient somewhat responding with nods and will follow some commands. No family at the bedside.   I originally had previously scheduled [place] meeting with family today at 1030am. However, family unable to all be present and requesting to have [place] over the weekend with MD after receiving further updates etc.   RN aware Palliative is not available over weekend. Will f/u with family and medical team first of the week for need to further engage and/or provide support if needed.   Alda Lea, AGPCNP-BC Palliative Medicine Team   NO CHARGE

## 2019-08-15 NOTE — Consult Note (Signed)
ANTICOAGULATION CONSULT NOTE   Pharmacy Consult for Heparin Dosing Indication: venous thromboembolism  Patient Measurements: Height: 5\' 10"  (177.8 cm) Weight: 245 lb 2.4 oz (111.2 kg) IBW/kg (Calculated) : 73 Heparin Dosing Weight: 93.5 kg  Vital Signs: Temp: 99.1 F (37.3 C) (11/26 2215) Temp Source: Bladder (11/26 2000) BP: 97/61 (11/26 2215) Pulse Rate: 90 (11/26 2215)  Labs: Recent Labs    08/13/19 0431 08/14/19 0537 08/14/19 1157 08/14/19 2218 08/15/19 0515  HGB 10.8* 11.0*  --   --  10.8*  HCT 34.6* 35.4*  --   --  34.9*  PLT 158 175  --   --  150  HEPARINUNFRC 0.65 0.77* 1.26* 0.40 0.32  CREATININE 0.44*  --   --   --  0.53*    Estimated Creatinine Clearance: 115 mL/min (A) (by C-G formula based on SCr of 0.53 mg/dL (L)).  Assessment: Dustin [name] is a 67 YOM admitted to the ICU on 08/13/2019 with respiratory failure requiring intubation. 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 Pharmacy has been consulted for heparin dosing. H&H, PLT stable  Patient received dose of apixaban 5 mg PO @ 1155 11/17.   Goal of Therapy:  - Heparin level 0.3-0.7 - Monitor platelets by anticoagulation protocol: Yes.   Plan:   Heparin level 1.26 at 1157 (supratherapeutic): hold heparin for one hour then decrease rate to 1100 units/hr  Heparin level at 2218 on 11/26 = 0.40, therapeutic x 1, continue current heparin rate  Heparin level at 0515 on 11/27 = 0.32, therapeutic x 2 but on low end and trending down.  CBC stable.  Increase heparin rate to 1200 units/hr to discourage drop to subtherapeutic level.   Re-check heparin level tomorrow am  CBC in am  Will continue pt on current rate and recheck HL on 11/27 with AM labs.   [name] A Clinical Pharmacist 08/15/2019,6:17 AM

## 2019-08-16 ENCOUNTER — Inpatient Hospital Stay: Payer: Medicare Other

## 2019-08-16 LAB — CBC WITH DIFFERENTIAL/PLATELET
Abs Immature Granulocytes: 0.17 10*3/uL — ABNORMAL HIGH (ref 0.00–0.07)
Basophils Absolute: 0 10*3/uL (ref 0.0–0.1)
Basophils Relative: 0 %
Eosinophils Absolute: 0 10*3/uL (ref 0.0–0.5)
Eosinophils Relative: 0 %
HCT: 36.6 % — ABNORMAL LOW (ref 39.0–52.0)
Hemoglobin: 11.4 g/dL — ABNORMAL LOW (ref 13.0–17.0)
Immature Granulocytes: 1 %
Lymphocytes Relative: 4 %
Lymphs Abs: 0.8 10*3/uL (ref 0.7–4.0)
MCH: 32 pg (ref 26.0–34.0)
MCHC: 31.1 g/dL (ref 30.0–36.0)
MCV: 102.8 fL — ABNORMAL HIGH (ref 80.0–100.0)
Monocytes Absolute: 0.7 10*3/uL (ref 0.1–1.0)
Monocytes Relative: 4 %
Neutro Abs: 17.3 10*3/uL — ABNORMAL HIGH (ref 1.7–7.7)
Neutrophils Relative %: 91 %
Platelets: 159 10*3/uL (ref 150–400)
RBC: 3.56 MIL/uL — ABNORMAL LOW (ref 4.22–5.81)
RDW: 12.9 % (ref 11.5–15.5)
WBC: 19 10*3/uL — ABNORMAL HIGH (ref 4.0–10.5)
nRBC: 0 % (ref 0.0–0.2)

## 2019-08-16 LAB — RENAL FUNCTION PANEL
Albumin: 2.9 g/dL — ABNORMAL LOW (ref 3.5–5.0)
Anion gap: 8 (ref 5–15)
BUN: 42 mg/dL — ABNORMAL HIGH (ref 8–23)
CO2: 36 mmol/L — ABNORMAL HIGH (ref 22–32)
Calcium: 8.8 mg/dL — ABNORMAL LOW (ref 8.9–10.3)
Chloride: 95 mmol/L — ABNORMAL LOW (ref 98–111)
Creatinine, Ser: 0.71 mg/dL (ref 0.61–1.24)
GFR calc Af Amer: >60 mL/min (ref >60)
GFR calc non Af Amer: >60 mL/min (ref >60)
Glucose, Bld: 133 mg/dL — ABNORMAL HIGH (ref 70–99)
Phosphorus: 3.6 mg/dL (ref 2.5–4.6)
Potassium: 4.5 mmol/L (ref 3.5–5.1)
Sodium: 139 mmol/L (ref 135–145)

## 2019-08-16 LAB — GLUCOSE, CAPILLARY
Glucose-Capillary: 117 mg/dL — ABNORMAL HIGH (ref 70–99)
Glucose-Capillary: 126 mg/dL — ABNORMAL HIGH (ref 70–99)
Glucose-Capillary: 127 mg/dL — ABNORMAL HIGH (ref 70–99)
Glucose-Capillary: 131 mg/dL — ABNORMAL HIGH (ref 70–99)
Glucose-Capillary: 144 mg/dL — ABNORMAL HIGH (ref 70–99)
Glucose-Capillary: 151 mg/dL — ABNORMAL HIGH (ref 70–99)

## 2019-08-16 MED ORDER — FUROSEMIDE 10 MG/ML IJ SOLN
40.0000 mg | Freq: Once | INTRAMUSCULAR | Status: AC
  Administered 2019-08-16: 40 mg via INTRAVENOUS
  Filled 2019-08-16: qty 4

## 2019-08-16 MED ORDER — ACETAMINOPHEN 325 MG PO TABS
650.0000 mg | ORAL_TABLET | Freq: Four times a day (QID) | ORAL | Status: DC | PRN
  Administered 2019-08-16 – 2019-08-18 (×3): 650 mg via ORAL
  Filled 2019-08-16 (×3): qty 2

## 2019-08-16 MED ORDER — ACETAMINOPHEN 650 MG RE SUPP: 650.0000 mg | Freq: Four times a day (QID) | RECTAL | Status: DC | PRN

## 2019-08-16 MED ORDER — POLYVINYL ALCOHOL 1.4 % OP SOLN
1.0000 [drp] | OPHTHALMIC | Status: DC | PRN
  Administered 2019-08-17 – 2019-08-19 (×3): 1 [drp] via OPHTHALMIC
  Filled 2019-08-16: qty 15

## 2019-08-16 MED ORDER — VANCOMYCIN HCL IN DEXTROSE 750-5 MG/150ML-% IV SOLN
750.0000 mg | Freq: Three times a day (TID) | INTRAVENOUS | Status: DC
  Administered 2019-08-16 – 2019-08-19 (×10): 750 mg via INTRAVENOUS
  Filled 2019-08-16 (×12): qty 150

## 2019-08-16 MED ORDER — VANCOMYCIN HCL 10 G IV SOLR
2000.0000 mg | Freq: Once | INTRAVENOUS | Status: AC
  Administered 2019-08-16: 2000 mg via INTRAVENOUS
  Filled 2019-08-16: qty 2000

## 2019-08-16 NOTE — Progress Notes (Signed)
Pharmacy Electrolyte/Glucose/Constipation Monitoring Consult:  Dustin [name] is a 42 YOM admitted to the ICU on 07/24/2019 with respiratory failure requiring intubation from COPD, CHF, and possible PNA. 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  Patient is on tube feeds (VITAL HIGH PROTEIN 1,000 mL at 20 mL/hr and PRO-STAT 60 mL QID).   Labs:  Sodium (mmol/L)  Date Value  08/16/2019 139  04/01/2019 145 (H)  07/23/2014 139   Potassium (mmol/L)  Date Value  08/16/2019 4.5  07/23/2014 4.1   Magnesium (mg/dL)  Date Value  08/15/2019 2.4   Phosphorus (mg/dL)  Date Value  08/16/2019 3.6   Calcium (mg/dL)  Date Value  08/16/2019 8.8 (L)   Calcium, Total (mg/dL)  Date Value  07/23/2014 8.4 (L)   Albumin (g/dL)  Date Value  08/16/2019 2.9 (L)  07/23/2014 2.8 (L)    Corrected Ca:  Assessment/Plan:  Electrolytes:  - No replacement warranted at this time - BMP, magnesium with am labs on 11/29.  Goals of Therapy: - Potassium ~ 4 - Magnesium ~ 2  - All other electrolytes within normal limits  Glucose:  BG 108-144 - Patient is on 0-9 units q4h SSI. - Patient requiring 3 units over past 24 hours  - on Solumedrol 40 IV bid - Will continue to monitor and consult with diabetes team as necessary.  Constipation:  - Patient with multiple bowel movements 11/26.  - D/C lactulose. Hold senna/docusate on 11/27 and resume 1 tab BID on 11/28.  - He is on fentanyl 300 mcg/hr for pain management.  Pharmacy will continue to monitor and adjust per consult.   Keily Lepp A, RPh 08/16/2019 11:37 AM

## 2019-08-16 NOTE — Progress Notes (Signed)
Follow up - Critical Care Medicine Note  Patient Details:    Dustin [name] is an 63 y.o. male smoker PTA, withhistory of COPD andchronic respiratory failure, chronic diastolic CHF, history of DVT on apixaban presented to the ER with complaints of having fever chills and fatigue.Patient's daughter went to check on him and found he was febrile and was brought to the ER. Patient admitted with acute on chronic hypoxic and hypercarbic respiratory failure failing BiPAP and ultimately being intubated and mechanically ventilated.  Lines, Airways, Drains: Airway 7.5 mm (Active)  Secured at (cm) 25 cm 08/14/19 1533  Measured From Lips 08/14/19 [place] 08/14/19 1533  Secured By [employer] 08/14/19 1533  Tube Holder Repositioned Yes 08/14/19 1533  Cuff Pressure (cm H2O) 28 cm H2O 08/14/19 1533  Site Condition Dry 08/14/19 1533     PICC Triple Lumen 33/29/51 PICC Right Basilic 41 cm 0 cm (Active)  Indication for Insertion or Continuance of Line Vasoactive infusions;Prolonged intravenous therapies 08/14/19 0800  Exposed Catheter (cm) 0 cm 08/06/19 1700  Site Assessment Clean;Dry;Intact 08/14/19 0800  Lumen #1 Status Infusing 08/14/19 0800  Lumen #2 Status Infusing 08/14/19 0800  Lumen #3 Status In-line blood sampling system in place 08/14/19 0800  Dressing Type Transparent;Occlusive 08/14/19 0800  Dressing Status Clean;Dry;Intact;Antimicrobial disc in place 08/14/19 0800  Line Care Connections checked and tightened 08/14/19 0800  Dressing Intervention Dressing changed 08/13/19 1600  Dressing Change Due 09-02-2019 08/14/19 0800     NG/OG Tube Orogastric 16 Fr. Center mouth Xray;Aucultation Documented cm marking at nare/ corner of mouth 60 cm (Active)  Cm Marking at Nare/Corner of Mouth (if applicable) 63 cm 88/41/66 1200  Site Assessment Clean;Dry;Intact 08/14/19 0800  Ongoing Placement Verification No change in cm markings or external length of tube from initial  placement;No change in respiratory status;No acute changes, not attributed to clinical condition 08/14/19 1200  Status Infusing tube feed 08/14/19 1200  Drainage Appearance Bile 07/26/2019 1122  Output (mL) 0 mL 08/06/19 0800     Urethral Catheter [name], RN Temperature probe 14 Fr. (Active)  Indication for Insertion or Continuance of Catheter Therapy based on hourly urine output monitoring and documentation for critical condition (NOT STRICT I&O) 08/14/19 0800  Site Assessment Clean;Intact;Dry 08/14/19 0800  Catheter Maintenance Bag below level of bladder;Catheter secured;Drainage bag/tubing not touching floor;Insertion date on drainage bag;No dependent loops;Seal intact 08/14/19 1200  Collection Container Standard drainage bag 08/14/19 0800  Securement Method Securing device (Describe) 08/14/19 0800  Urinary Catheter Interventions (if applicable) Unclamped 03/18/15 0400  Output (mL) 200 mL 08/14/19 1400    Anti-infectives:  Anti-infectives (From admission, onward)   Start     Dose/Rate Route Frequency Ordered Stop   08/16/19 1600  vancomycin (VANCOCIN) IVPB 750 mg/150 ml premix     750 mg 150 mL/hr over 60 Minutes Intravenous Every 8 hours 08/16/19 0656     08/16/19 0745  vancomycin (VANCOCIN) 2,000 mg in sodium chloride 0.9 % 500 mL IVPB     2,000 mg 250 mL/hr over 120 Minutes Intravenous  Once 08/16/19 0653     08/15/19 1630  piperacillin-tazobactam (ZOSYN) IVPB 3.375 g  Status:  Discontinued     3.375 g 12.5 mL/hr over 240 Minutes Intravenous Every 8 hours 08/15/19 1513 08/15/19 1513   08/15/19 1630  piperacillin-tazobactam (ZOSYN) IVPB 3.375 g     3.375 g 12.5 mL/hr over 240 Minutes Intravenous Every 8 hours 08/15/19 1513 08/22/19 1359   08/07/19 1000  azithromycin (ZITHROMAX)  tablet 500 mg     500 mg Oral Daily 08/06/19 1344 08/09/19 1035   08/06/19 1000  azithromycin (ZITHROMAX) 500 mg in sodium chloride 0.9 % 250 mL IVPB  Status:  Discontinued     500 mg 250 mL/hr over 60  Minutes Intravenous Every 24 hours 07/24/2019 1348 08/06/19 1344   07/31/2019 2000  azithromycin (ZITHROMAX) 500 mg in sodium chloride 0.9 % 250 mL IVPB  Status:  Discontinued     500 mg 250 mL/hr over 60 Minutes Intravenous Every 24 hours 08/06/2019 0629 08/14/2019 0640   07/27/2019 1800  cefTRIAXone (ROCEPHIN) 2 g in sodium chloride 0.9 % 100 mL IVPB     2 g 200 mL/hr over 30 Minutes Intravenous Every 24 hours 08/10/2019 0629 08/09/19 1900   07/25/2019 1700  doxycycline (VIBRAMYCIN) 100 mg in sodium chloride 0.9 % 250 mL IVPB  Status:  Discontinued     100 mg 125 mL/hr over 120 Minutes Intravenous Every 12 hours 08/08/2019 0640 08/18/2019 1348   08/18/2019 0430  cefTRIAXone (ROCEPHIN) 1 g in sodium chloride 0.9 % 100 mL IVPB     1 g 200 mL/hr over 30 Minutes Intravenous  Once 08/17/2019 0426 08/16/2019 0546   08/04/2019 0430  azithromycin (ZITHROMAX) 500 mg in sodium chloride 0.9 % 250 mL IVPB     500 mg 250 mL/hr over 60 Minutes Intravenous  Once 08/01/2019 0426 07/30/2019 0555      Microbiology: Results for orders placed or performed during the hospital encounter of 08/08/2019  Culture, blood (routine x 2)     Status: None   Collection Time: 07/26/2019  4:35 AM   Specimen: BLOOD  Result Value Ref Range Status   Specimen Description BLOOD RIGHT WRIST   Final   Special Requests   Final    BOTTLES DRAWN AEROBIC AND ANAEROBIC Blood Culture results may not be optimal due to an excessive volume of blood received in culture bottles   Culture   Final    NO GROWTH 5 DAYS Performed at [facility], [address] [place], [place] [address]    Report Status 08/10/2019 FINAL  Final  Culture, blood (routine x 2)     Status: None   Collection Time: 08/13/2019  4:35 AM   Specimen: BLOOD  Result Value Ref Range Status   Specimen Description BLOOD LEFT HAND  Final   Special Requests   Final    BOTTLES DRAWN AEROBIC AND ANAEROBIC Blood Culture results may not be optimal due to an excessive volume of blood received  in culture bottles   Culture   Final    NO GROWTH 5 DAYS Performed at [facility], [address] [place],  [address]    Report Status 08/10/2019 FINAL  Final  SARS Coronavirus 2 by RT PCR (hospital order, performed in [facility] hospital lab) Nasopharyngeal Nasopharyngeal Swab     Status: None   Collection Time: 07/21/2019  9:41 AM   Specimen: Nasopharyngeal Swab  Result Value Ref Range Status   SARS Coronavirus 2 NEGATIVE NEGATIVE Final    Comment: (NOTE) If result is NEGATIVE SARS-CoV-2 target nucleic acids are NOT DETECTED. The SARS-CoV-2 RNA is generally detectable in upper and lower  respiratory specimens during the acute phase of infection. The lowest  concentration of SARS-CoV-2 viral copies this assay can detect is 250  copies / mL. A negative result does not preclude SARS-CoV-2 infection  and should not be used as the sole basis for treatment or other  patient management decisions.  A negative result may occur with  improper specimen collection / handling, submission of specimen other  than nasopharyngeal swab, presence of viral mutation(s) within the  areas targeted by this assay, and inadequate number of viral copies  (<250 copies / mL). A negative result must be combined with clinical  observations, patient history, and epidemiological information. If result is POSITIVE SARS-CoV-2 target nucleic acids are DETECTED. The SARS-CoV-2 RNA is generally detectable in upper and lower  respiratory specimens dur ing the acute phase of infection.  Positive  results are indicative of active infection with SARS-CoV-2.  Clinical  correlation with patient history and other diagnostic information is  necessary to determine patient infection status.  Positive results do  not rule out bacterial infection or co-infection with other viruses. If result is PRESUMPTIVE POSTIVE SARS-CoV-2 nucleic acids MAY BE PRESENT.   A presumptive positive result was obtained on the  submitted specimen  and confirmed on repeat testing.  While 2019 novel coronavirus  (SARS-CoV-2) nucleic acids may be present in the submitted sample  additional confirmatory testing may be necessary for epidemiological  and / or clinical management purposes  to differentiate between  SARS-CoV-2 and other Sarbecovirus currently known to infect humans.  If clinically indicated additional testing with an alternate test  methodology [phone]) is advised. The SARS-CoV-2 RNA is generally  detectable in upper and lower respiratory sp ecimens during the acute  phase of infection. The expected result is Negative. Fact Sheet for Patients:  StrictlyIdeas.no Fact Sheet for Healthcare Providers: BankingDealers.co.za This test is not yet approved or cleared by the [place] FDA and has been authorized for detection and/or diagnosis of SARS-CoV-2 by FDA under an Emergency Use Authorization (EUA).  This EUA will remain in effect (meaning this test can be used) for the duration of the COVID-19 declaration under Section 564(b)(1) of the Act, 21 U.S.C. section 360bbb-3(b)(1), unless the authorization is terminated or revoked sooner. Performed at [facility], [address] [place], [place] [address]   MRSA PCR Screening     Status: Abnormal   Collection Time: 08/07/2019 12:11 PM   Specimen: Nasal Mucosa; Nasopharyngeal  Result Value Ref Range Status   MRSA by PCR POSITIVE (A) NEGATIVE Final    Comment:        The GeneXpert MRSA Assay (FDA approved for NASAL specimens only), is one component of a comprehensive MRSA colonization surveillance program. It is not intended to diagnose MRSA infection nor to guide or monitor treatment for MRSA infections. RESULT CALLED TO, READ BACK BY AND VERIFIED WITH: CHARLIE WAITLEY 07/28/2019 1340 KLW Performed at [facility], [address] [place], [place] [address]   Culture, respiratory  (non-expectorated)     Status: None (Preliminary result)   Collection Time: 08/15/19  3:18 PM   Specimen: Tracheal Aspirate; Respiratory  Result Value Ref Range Status   Specimen Description   Final    TRACHEAL ASPIRATE Performed at [facility], [address] [place], [place] [address]    Special Requests   Final    NONE Performed at [facility], [address] [place], [place] [address]    Gram Stain   Final    ABUNDANT WBC PRESENT, PREDOMINANTLY PMN FEW SQUAMOUS EPITHELIAL CELLS PRESENT ABUNDANT GRAM POSITIVE COCCI MODERATE GRAM POSITIVE RODS Performed at [facility], [address], [place], [place] [address]    Culture PENDING  Incomplete   Report Status PENDING  Incomplete  Best Practice/Protocols:  VTE Prophylaxis: Lovenox (full dose) GI Prophylaxis: Antihistamine Continous Sedation  Events: 11/17: Intubated. Started on neo 11/18:PICC line placed.Remains intubated, FiO2 50%. On neo at 40 mcg. Sedated on 15 mcg propofol and 200 mcg fentanyl. Responds to verbal stimuli and follows commands. 11/19: Remains intubated and sedated. FiO2 55%. On neo at 22, vasopressin .03. Sedated with 25 propofol and 300 fentanyl. Unresponsive to verbal stimuli. 11/20: Pt confused and delirious when sedated weaned.Remains intubated and sedated - FiO2 50%, 3 mcg neo, .03 units vasopressin. Sedated on 20 mcg propofol, 300 mcg fentanyl.  11/21remains on vent, failed SAT/SBT 11/22 remains vent fio2 80%, all family members updated 11/23 remains on vent, pressors, severe hypoxia,family updated 11/24 unable to wean severe hypoxia 11/26 unable to wean due to persistent severe hypoxia, updated brother and sister 11/27 thick purulent secretions, fever, increase FiO2 to 90%, antibiotics were started: Zosyn 11/28 Vancomycin added +GPC '@sputum'   Studies: Dg Chest 2 View  Result Date: 08/16/2019 CLINICAL DATA:  Fever, shortness of breath EXAM: CHEST - 2 VIEW  COMPARISON:  07/09/2019 FINDINGS: Heart is upper limits normal in size. Bibasilar opacities are again noted, similar to prior study. No visible effusions. No acute bony abnormality. IMPRESSION: Bibasilar opacities, similar to prior study. This could reflect atelectasis or infiltrates/pneumonia. Electronically Signed   By: Rolm Baptise M.D.   On: 08/11/2019 00:47   Dg Abdomen 1 View  Result Date: 08/04/2019 CLINICAL DATA:  OG tube placement. EXAM: ABDOMEN - 1 VIEW COMPARISON:  11/30/2015 FINDINGS: The OG tube tip is in the fundus of the stomach and could be advanced. The visualized bowel gas pattern is normal. Markings are slightly accentuated at the lung bases. IMPRESSION: 1. OG tube tip is in the fundus of the stomach and could be advanced slightly. 2. No acute abnormalities in the abdomen. Electronically Signed   By: Lorriane Shire M.D.   On: 08/12/2019 11:42   Dg Chest Port 1 View  Result Date: 08/16/2019 CLINICAL DATA:  Respiratory failure EXAM: PORTABLE CHEST 1 VIEW COMPARISON:  Chest x-rays dated 08/14/2019 and 09/04/2013. Chest CT dated 06/24/2019. FINDINGS: Stable cardiomegaly. Endotracheal tube is positioned approximately 6 cm above the carina. RIGHT-sided PICC line is stable in position with tip at the level of the mid/lower SVC. Enteric tube passes below the diaphragm. Patchy bibasilar opacities are unchanged in the short-term interval, likely atelectasis and/or small pleural effusions. Emphysematous changes again noted within the upper lungs bilaterally. No pneumothorax seen. IMPRESSION: 1. Stable chest x-ray. Endotracheal tube is positioned approximately 6 cm above the carina. 2. Stable cardiomegaly. 3. Stable patchy bibasilar opacities, likely atelectasis and/or small pleural effusions. Electronically Signed   By: Franki Cabot M.D.   On: 08/16/2019 07:42   Dg Chest Port 1 View  Result Date: 08/14/2019 CLINICAL DATA:  Respiratory distress. Acute on chronic respiratory failure. EXAM:  PORTABLE CHEST 1 VIEW 2:25 p.m. COMPARISON:  Chest x-rays dated 08/14/2019 at 12:24 a.m. and 08/11/2019 and CT scan of the chest dated 06/24/2019 FINDINGS: Endotracheal tube, PICC and NG tube appear in good position. Heart size and pulmonary vascularity are normal. There is increasing infiltrate in the left perihilar region. Small bilateral pleural effusions are unchanged. Faint infiltrate at the right lung base is essentially unchanged. Severe emphysematous changes in the upper lobes as demonstrated on the prior CT scan. IMPRESSION: 1. Increasing infiltrate in the left perihilar region. 2. No change in the faint infiltrate at the right lung base. 3. Stable small bilateral pleural effusions. Electronically  Signed   By: Lorriane Shire M.D.   On: 08/14/2019 14:52   Dg Chest Port 1 View  Result Date: 08/14/2019 CLINICAL DATA:  Respiratory failure EXAM: PORTABLE CHEST 1 VIEW COMPARISON:  08/11/2019 FINDINGS: Support Apparatus: --Endotracheal tube: Tip just below the level of the clavicular heads. --Enteric tube:Tip and sideport are below the field of view. --Catheter(s):Right peripheral insertion approach central venous catheter tip is in the lower SVC --Other: None Unchanged bibasilar opacities. Small pleural effusions. IMPRESSION: 1. Radiographically satisfactory appearance of support apparatus. 2. Small pleural effusions and bibasilar atelectasis. Electronically Signed   By: Ulyses Jarred M.D.   On: 08/14/2019 00:44   Dg Chest Port 1 View  Result Date: 08/11/2019 CLINICAL DATA:  63 year old male with respiratory failure. EXAM: PORTABLE CHEST 1 VIEW COMPARISON:  08/09/2019 and earlier. FINDINGS: Portable AP upright view at 0536 hours. Stable endotracheal tube tip between the level the clavicles and carina. Enteric tube remains in place, tip in the left upper quadrant and side hole not delineated. Right PICC line remains in place. Stable lung volumes and mediastinal contours. No pneumothorax or pulmonary  edema. Chronic lung disease with emphysema. Patchy bibasilar opacity is stable. Paucity of bowel gas in the upper abdomen. Chronic upper abdominal surgical clips. IMPRESSION: 1. Stable lines and tubes. 2. Chronic lung disease with stable patchy bibasilar opacity most resembling atelectasis. 3. No new cardiopulmonary abnormality. Electronically Signed   By: Genevie Ann M.D.   On: 08/11/2019 06:09   Dg Chest Port 1 View  Result Date: 08/09/2019 CLINICAL DATA:  Acute respiratory failure. EXAM: PORTABLE CHEST 1 VIEW COMPARISON:  08/07/2019 FINDINGS: 0538 hours. Endotracheal tube tip is 6.7 cm above the base of the carina. The NG tube passes into the stomach although the distal tip position is not included on the film. Low volumes with vascular congestion and bibasilar atelectasis/infiltrate. Tiny bilateral pleural effusions noted. Right PICC line tip overlies the mid SVC. Telemetry leads overlie the chest. IMPRESSION: 1. Low volume film with vascular congestion and bibasilar atelectasis/infiltrate. 2. Tiny bilateral pleural effusions. 3. Support apparatus as described. Electronically Signed   By: Misty Stanley M.D.   On: 08/09/2019 08:58   Dg Chest Port 1 View  Result Date: 08/07/2019 CLINICAL DATA:  Acute respiratory failure. EXAM: PORTABLE CHEST 1 VIEW COMPARISON:  Radiograph 07/24/2019. CT 06/24/2019 FINDINGS: Endotracheal tube tip 5.2 cm from the carina. Enteric tube in place with tip below the diaphragm, not included in the field of view. Right upper extremity PICC tip in the lower SVC. Advanced emphysema with patchy bibasilar opacities, likely scarring. No pneumothorax. Blunting of the costophrenic angles, likely due to chronic hyperinflation rather than pleural effusions. IMPRESSION: 1. Advanced emphysema with patchy bibasilar opacities, favor scarring. 2. Support apparatus unchanged. Electronically Signed   By: Keith Rake M.D.   On: 08/07/2019 06:52   Dg Chest Portable 1 View  Result Date:  07/29/2019 CLINICAL DATA:  Hypoxia EXAM: PORTABLE CHEST 1 VIEW COMPARISON:  August 05, 2019 study obtained earlier in the day FINDINGS: Endotracheal tube tip is 4.7 cm above the carina. No pneumothorax. There is patchy airspace opacity in both lung bases with associated atelectasis and pleural effusions. Pleural effusions are larger than on study obtained earlier in the day. There appears to be bullous disease in the upper lobes, particularly on the right. Heart size is normal. There is diminished pulmonary vascularity in the upper lobes, likely due to bullous disease. Pulmonary vascularity appears stable. No adenopathy. No bone lesions evident. IMPRESSION:  Endotracheal tube as described without pneumothorax. There are new small pleural effusions bilaterally. There is patchy airspace consolidation and atelectasis in the lung bases. Probable bullous disease in the upper lobes. Stable cardiac silhouette. Electronically Signed   By: Lowella Grip III M.D.   On: 08/16/2019 10:03   [place] Ekg Site Rite  Result Date: 08/06/2019 If Site Rite image not attached, placement could not be confirmed due to current cardiac rhythm.   Consults: Palliative Care  Subjective:    Overnight Issues: FiO2 down to 60%, continued with purulent secretions, vancomycin added yesterday evening on Zosyn.  GPC's in sputum.    Objective:  Vital signs for last 24 hours: Temp:  [99 F (37.2 C)-100.9 F (38.3 C)] 100.4 F (38 C) (11/28 0800) Pulse Rate:  [95-137] 116 (11/28 0800) Resp:  [13-26] 21 (11/28 0800) BP: (85-172)/(50-92) 123/78 (11/28 0800) SpO2:  [89 %-96 %] 92 % (11/28 0818) FiO2 (%):  [60 %-90 %] 60 % (11/28 0818) Weight:  [107.5 kg] 107.5 kg (11/28 0500)  Hemodynamic parameters for last 24 hours:    Intake/Output from previous day: 11/27 0701 - 11/28 0700 In: 1376.7 [I.V.:610; NG/GT:691.5; IV Piggyback:75.3] Out: 1470 [Urine:1470]  Intake/Output this shift: Total I/O In: -  Out: 200  [Urine:200]  Vent settings for last 24 hours: Vent Mode: PRVC FiO2 (%):  [60 %-90 %] 60 % Set Rate:  [18 bmp] 18 bmp Vt Set:  [500 mL] 500 mL PEEP:  [5 cmH20] 5 cmH20 Plateau Pressure:  [11 cmH20-14 cmH20] 14 cmH20  Physical Exam:  GENERAL: Chronically ill appearing, synchronous with the vent when sedated, when sedation lightened he does interact, smiles. HEAD: Normocephalic, atraumatic.  EYES: Pupils equal, round, reactive. No scleral icterus.No scleral edema MOUTH: Orotracheally intubated, OG in place.   NECK: Supple.  Trachea midline, no crepitus. PULMONARY: Distant breath sounds poor air movement no wheezes.  Coarse breath sounds.   CARDIOVASCULAR: S1 and S2. Regular rate and rhythm. No murmurs, rubs, or gallops.  GASTROINTESTINAL: Soft, nondistended. No masses. Positive bowel sounds.  MUSCULOSKELETAL: No swelling, clubbing, 3+ anasarca. NEUROLOGIC: Sedation requirements down today.  He is interactive, smiles, tracks. SKIN:intact,warm,dry, multiple ecchymoses.  Assessment/Plan:   Severe ACUTE ON CHRONIC hypoxic and hypercapnic respiratory failure due to exacerbation of stage IV (very severe) COPD  continue Full MV support  Wean Fio2 and PEEP as tolerated  Has not been able to tolerate weaning and suspect that he will have to be one-way extubation to comfort care  Etiology of exacerbation possible pneumonia/bronchitis aggravated by diastolic heart failure  Has pneumonia with GPC's query whether is incomplete treatment of admission pneumonia.  Change in sputum character yesterday, recultured, antibiotics restarted  Underlying cor pulmonale and pulmonary hypertension also make the issue more complex  Acute on chronic diastolic heart failure:  Diuretics as tolerated, has not been able to tolerate due to hypotension  Significant volume overload, Lasix x1 today, close monitoring  Difficult to manage due to coexistent COR PULMONALE/pulmonary  hypertension   NEUROLOGIC  intubated and sedated  minimal sedation to achieve a RASS goal: 0/-1  Not asynchronous today when sedation was lightened  SEVERE COPD EXACERBATION  Completed IV antibiotics, had to restart yesterday due to change in sputum character  Continue bronchodilators  Will discontinue IV steroids, no bronchospasm, continue nebulized steroids  SHOCK: Persistent shock physiology could be due to right heart failure  Pressors as needed to maintain MAP > 65  Acute on chronic kidney injury  Avoid nephrotoxins  Follow renal  panel  No indication for dialysis  Likely ATN  ID  Resumed antibiotics yesterday: Zosyn/Vanco  Cultured sputum due to change in character of secretions   Pneumonia likely query incomplete treatment of admission pneumonia  Follow-up chest x-ray today showed persistent atelectasis at bases and possible bilateral pleural effusions  Moderate protein calorie malnutrition   TF's as tolerated  Constipation protocol as indicated  Third spacing will aggravate volume issues, albumin 2.9 today  ENDO  will use ICU hypoglycemic\Hyperglycemia protocol if indicated  ELECTROLYTES  follow labs as needed  replace as needed  pharmacy consulted and following   Overall, patient is critically ill, prognosis is exceedingly poor due severe underlying lung disease. Patient with multiorgan failure and at high risk for cardiac arrest and death.  Patient is DNR, palliative care consulted.      LOS: 11 days   Additional comments: Met with family today.  They are still not willing to transition the patient to comfort care.  They would want to wait for 21 days total.  They do agree on no tracheostomy.  Critical Care Total Time*: 45 Minutes  C. Derrill Kay, MD  PCCM 08/16/2019  *Care during the described time interval was provided by me and/or other providers on the critical care team.  I have reviewed this patient's  available data, including medical history, events of note, physical examination and test results as part of my evaluation.  **This note was dictated using voice recognition software/Dragon.  Despite best efforts to proofread, errors can occur which can change the meaning.  Any change was purely unintentional.

## 2019-08-16 NOTE — Progress Notes (Signed)
Pharmacy Antibiotic Note  Dustin [name] is a 63 y.o. male admitted on 07/30/2019 with pneumonia.  Pharmacy has been consulted for Vancomycin dosing.  Plan: Vancomycin 2000mg  x 1 (loading dose) then 750 mg IV Q 8 hrs. Goal AUC 400-550. Expected AUC: 502.6 SCr used: 0.8  Continue Zosyn 3.375gm IV q8hrs  Height: 5\' 10"  (177.8 cm) Weight: 236 lb 15.9 oz (107.5 kg) IBW/kg (Calculated) : 73  Temp (24hrs), Avg:100.2 F (37.9 C), Min:98.4 F (36.9 C), Max:100.9 F (38.3 C)  Recent Labs  Lab 08/11/19 0227  08/12/19 0451 08/13/19 0431 08/14/19 0537 08/15/19 0515 08/16/19 0456 08/16/19 0500  WBC  --    < > 13.1* 11.4* 14.2* 12.0*  --  19.0*  CREATININE 0.69  --  0.71 0.44*  --  0.53* 0.71  --    < > = values in this interval not displayed.    Estimated Creatinine Clearance: 113 mL/min (by C-G formula based on SCr of 0.71 mg/dL).    Allergies  Allergen Reactions  . Asa [Aspirin] Other (See Comments)    Reaction: swelling of the right side.  . Bee Venom Swelling  . Codeine Other (See Comments)    Reaction: Difficulty breathing  . Ibuprofen Other (See Comments)    Reaction: Swelling of the right side.  . Iodinated Diagnostic Agents Other (See Comments)    Reaction:  Unknown  Other reaction(s): Unknown    Antimicrobials this admission: Zosyn 11/27 >>  Vancomycin 11/28 >>   Dose adjustments this admission:   Microbiology results:  BCx:   UCx:    Sputum:    MRSA PCR:   Thank you for allowing pharmacy to be a part of this patient's care.  [name] A 08/16/2019 6:57 AM

## 2019-08-17 ENCOUNTER — Inpatient Hospital Stay: Payer: Medicare Other

## 2019-08-17 LAB — BASIC METABOLIC PANEL
Anion gap: 8 (ref 5–15)
Anion gap: 12 (ref 5–15)
BUN: 37 mg/dL — ABNORMAL HIGH (ref 8–23)
BUN: 46 mg/dL — ABNORMAL HIGH (ref 8–23)
CO2: 36 mmol/L — ABNORMAL HIGH (ref 22–32)
CO2: 35 mmol/L — ABNORMAL HIGH (ref 22–32)
Calcium: 8.2 mg/dL — ABNORMAL LOW (ref 8.9–10.3)
Calcium: 8.7 mg/dL — ABNORMAL LOW (ref 8.9–10.3)
Chloride: 95 mmol/L — ABNORMAL LOW (ref 98–111)
Chloride: 93 mmol/L — ABNORMAL LOW (ref 98–111)
Creatinine, Ser: 0.55 mg/dL — ABNORMAL LOW (ref 0.61–1.24)
Creatinine, Ser: 0.7 mg/dL (ref 0.61–1.24)
GFR calc Af Amer: >60 mL/min (ref >60)
GFR calc Af Amer: >60 mL/min (ref >60)
GFR calc non Af Amer: >60 mL/min (ref >60)
GFR calc non Af Amer: >60 mL/min (ref >60)
Glucose, Bld: 148 mg/dL — ABNORMAL HIGH (ref 70–99)
Glucose, Bld: 123 mg/dL — ABNORMAL HIGH (ref 70–99)
Potassium: 3.7 mmol/L (ref 3.5–5.1)
Potassium: 4 mmol/L (ref 3.5–5.1)
Sodium: 139 mmol/L (ref 135–145)
Sodium: 140 mmol/L (ref 135–145)

## 2019-08-17 LAB — GLUCOSE, CAPILLARY
Glucose-Capillary: 147 mg/dL — ABNORMAL HIGH (ref 70–99)
Glucose-Capillary: 127 mg/dL — ABNORMAL HIGH (ref 70–99)
Glucose-Capillary: 108 mg/dL — ABNORMAL HIGH (ref 70–99)
Glucose-Capillary: 133 mg/dL — ABNORMAL HIGH (ref 70–99)
Glucose-Capillary: 121 mg/dL — ABNORMAL HIGH (ref 70–99)
Glucose-Capillary: 137 mg/dL — ABNORMAL HIGH (ref 70–99)

## 2019-08-17 LAB — CBC
HCT: 31.3 % — ABNORMAL LOW (ref 39.0–52.0)
Hemoglobin: 10.2 g/dL — ABNORMAL LOW (ref 13.0–17.0)
MCH: 32.1 pg (ref 26.0–34.0)
MCHC: 32.6 g/dL (ref 30.0–36.0)
MCV: 98.4 fL (ref 80.0–100.0)
Platelets: 139 10*3/uL — ABNORMAL LOW (ref 150–400)
RBC: 3.18 MIL/uL — ABNORMAL LOW (ref 4.22–5.81)
RDW: 12.9 % (ref 11.5–15.5)
WBC: 14.2 10*3/uL — ABNORMAL HIGH (ref 4.0–10.5)
nRBC: 0 % (ref 0.0–0.2)

## 2019-08-17 LAB — TROPONIN I (HIGH SENSITIVITY)
Troponin I (High Sensitivity): 49 ng/L — ABNORMAL HIGH (ref <18)
Troponin I (High Sensitivity): 52 ng/L — ABNORMAL HIGH (ref <18)

## 2019-08-17 LAB — MAGNESIUM
Magnesium: 1.8 mg/dL (ref 1.7–2.4)
Magnesium: 2.3 mg/dL (ref 1.7–2.4)

## 2019-08-17 LAB — PHOSPHORUS: Phosphorus: 4.4 mg/dL (ref 2.5–4.6)

## 2019-08-17 MED ORDER — MAGNESIUM SULFATE 2 GM/50ML IV SOLN
2.0000 g | Freq: Once | INTRAVENOUS | Status: AC
  Administered 2019-08-17: 2 g via INTRAVENOUS
  Filled 2019-08-17: qty 50

## 2019-08-17 MED ORDER — FUROSEMIDE 10 MG/ML IJ SOLN
40.0000 mg | Freq: Once | INTRAMUSCULAR | Status: AC
  Administered 2019-08-17: 40 mg via INTRAVENOUS
  Filled 2019-08-17: qty 4

## 2019-08-17 MED ORDER — POTASSIUM CHLORIDE CRYS ER 20 MEQ PO TBCR
20.0000 meq | EXTENDED_RELEASE_TABLET | Freq: Once | ORAL | Status: AC
  Administered 2019-08-17: 20 meq via ORAL
  Filled 2019-08-17: qty 1

## 2019-08-17 MED ORDER — NOREPINEPHRINE 4 MG/250ML-% IV SOLN
0.0000 ug/min | INTRAVENOUS | Status: DC
  Administered 2019-08-17: 5 ug/min via INTRAVENOUS
  Filled 2019-08-17 (×2): qty 250

## 2019-08-17 MED ORDER — POTASSIUM CHLORIDE 20 MEQ PO PACK
40.0000 meq | PACK | Freq: Once | ORAL | Status: AC
  Administered 2019-08-17: 40 meq via ORAL
  Filled 2019-08-17: qty 2

## 2019-08-17 NOTE — Progress Notes (Signed)
Pharmacy Electrolyte/Glucose/Constipation Monitoring Consult:  Dustin [name] is a 7 YOM admitted to the ICU on August 08, 2019 with respiratory failure requiring intubation from COPD, CHF, and possible PNA. 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  Patient is on tube feeds (VITAL HIGH PROTEIN 1,000 mL at 20 mL/hr and PRO-STAT 60 mL QID).   Labs:  Sodium (mmol/L)  Date Value  08/17/2019 139  04/01/2019 145 (H)  07/23/2014 139   Potassium (mmol/L)  Date Value  08/17/2019 3.7  07/23/2014 4.1   Magnesium (mg/dL)  Date Value  08/17/2019 1.8   Phosphorus (mg/dL)  Date Value  08/16/2019 3.6   Calcium (mg/dL)  Date Value  08/17/2019 8.2 (L)   Calcium, Total (mg/dL)  Date Value  07/23/2014 8.4 (L)   Albumin (g/dL)  Date Value  08/16/2019 2.9 (L)  07/23/2014 2.8 (L)    Corrected Ca:  Assessment/Plan:  Electrolytes:  - Mag 1.8.  Will order Magnesium sulfate 2 gm IV x 1 -K 3.7. given lasix 40 IV x 1 yest 11/28. Will order KCL 20 meq po x 1 - BMP, magnesium with am labs   Goals of Therapy: - Potassium ~ 4 - Magnesium ~ 2  - All other electrolytes within normal limits  Glucose:  BG 117-151 - Patient is on 0-9 units q4h SSI. - Patient requiring 6 units over past 24 hours  - on Solumedrol 40 IV bid - Will continue to monitor and consult with diabetes team as necessary.  Constipation:  - Patient with multiple bowel movements 11/26.  - D/C lactulose. Hold senna/docusate on 11/27 and resume 1 tab BID on 11/28.  May need more lactulose if no BM today 11/29? - He is on fentanyl 300 mcg/hr for pain management.  Pharmacy will continue to monitor and adjust per consult.   Dustin [name] A, RPh 08/17/2019 7:52 AM

## 2019-08-17 NOTE — Progress Notes (Addendum)
Follow up - Critical Care Medicine Note  Patient Details:    Dustin [name] is an 63 y.o. male smoker PTA, withhistory of COPD andchronic respiratory failure, chronic diastolic CHF, history of DVT on apixaban presented to the ER with complaints of having fever chills and fatigue.Patient's daughter went to check on him and found he was febrile and was brought to the ER. Patient admitted with acute on chronic hypoxic and hypercarbic respiratory failure failing BiPAP and ultimately being intubated and mechanically ventilated.  Lines, Airways, Drains: Airway 7.5 mm (Active)  Secured at (cm) 25 cm 08/14/19 1533  Measured From Lips 08/14/19 [place] 08/14/19 1533  Secured By [employer] 08/14/19 1533  Tube Holder Repositioned Yes 08/14/19 1533  Cuff Pressure (cm H2O) 28 cm H2O 08/14/19 1533  Site Condition Dry 08/14/19 1533     PICC Triple Lumen 70/96/28 PICC Right Basilic 41 cm 0 cm (Active)  Indication for Insertion or Continuance of Line Vasoactive infusions;Prolonged intravenous therapies 08/14/19 0800  Exposed Catheter (cm) 0 cm 08/06/19 1700  Site Assessment Clean;Dry;Intact 08/14/19 0800  Lumen #1 Status Infusing 08/14/19 0800  Lumen #2 Status Infusing 08/14/19 0800  Lumen #3 Status In-line blood sampling system in place 08/14/19 0800  Dressing Type Transparent;Occlusive 08/14/19 0800  Dressing Status Clean;Dry;Intact;Antimicrobial disc in place 08/14/19 0800  Line Care Connections checked and tightened 08/14/19 0800  Dressing Intervention Dressing changed 08/13/19 1600  Dressing Change Due 09/10/2019 08/14/19 0800     NG/OG Tube Orogastric 16 Fr. Center mouth Xray;Aucultation Documented cm marking at nare/ corner of mouth 60 cm (Active)  Cm Marking at Nare/Corner of Mouth (if applicable) 63 cm 36/62/94 1200  Site Assessment Clean;Dry;Intact 08/14/19 0800  Ongoing Placement Verification No change in cm markings or external length of tube from initial  placement;No change in respiratory status;No acute changes, not attributed to clinical condition 08/14/19 1200  Status Infusing tube feed 08/14/19 1200  Drainage Appearance Bile 07/27/2019 1122  Output (mL) 0 mL 08/06/19 0800     Urethral Catheter [name], RN Temperature probe 14 Fr. (Active)  Indication for Insertion or Continuance of Catheter Therapy based on hourly urine output monitoring and documentation for critical condition (NOT STRICT I&O) 08/14/19 0800  Site Assessment Clean;Intact;Dry 08/14/19 0800  Catheter Maintenance Bag below level of bladder;Catheter secured;Drainage bag/tubing not touching floor;Insertion date on drainage bag;No dependent loops;Seal intact 08/14/19 1200  Collection Container Standard drainage bag 08/14/19 0800  Securement Method Securing device (Describe) 08/14/19 0800  Urinary Catheter Interventions (if applicable) Unclamped 76/54/65 0400  Output (mL) 200 mL 08/14/19 1400    Anti-infectives:  Anti-infectives (From admission, onward)   Start     Dose/Rate Route Frequency Ordered Stop   08/16/19 1600  vancomycin (VANCOCIN) IVPB 750 mg/150 ml premix     750 mg 150 mL/hr over 60 Minutes Intravenous Every 8 hours 08/16/19 0656     08/16/19 0745  vancomycin (VANCOCIN) 2,000 mg in sodium chloride 0.9 % 500 mL IVPB     2,000 mg 250 mL/hr over 120 Minutes Intravenous  Once 08/16/19 0653 08/16/19 1807   08/15/19 1630  piperacillin-tazobactam (ZOSYN) IVPB 3.375 g  Status:  Discontinued     3.375 g 12.5 mL/hr over 240 Minutes Intravenous Every 8 hours 08/15/19 1513 08/15/19 1513   08/15/19 1630  piperacillin-tazobactam (ZOSYN) IVPB 3.375 g     3.375 g 12.5 mL/hr over 240 Minutes Intravenous Every 8 hours 08/15/19 1513 08/22/19 1359   08/07/19 1000  azithromycin (ZITHROMAX)  tablet 500 mg     500 mg Oral Daily 08/06/19 1344 08/09/19 1035   08/06/19 1000  azithromycin (ZITHROMAX) 500 mg in sodium chloride 0.9 % 250 mL IVPB  Status:  Discontinued     500 mg 250 mL/hr  over 60 Minutes Intravenous Every 24 hours 07/21/2019 1348 08/06/19 1344   07/31/2019 2000  azithromycin (ZITHROMAX) 500 mg in sodium chloride 0.9 % 250 mL IVPB  Status:  Discontinued     500 mg 250 mL/hr over 60 Minutes Intravenous Every 24 hours 07/22/2019 0629 07/28/2019 0640   07/27/2019 1800  cefTRIAXone (ROCEPHIN) 2 g in sodium chloride 0.9 % 100 mL IVPB     2 g 200 mL/hr over 30 Minutes Intravenous Every 24 hours 08/06/2019 0629 08/09/19 1900   08/07/2019 1700  doxycycline (VIBRAMYCIN) 100 mg in sodium chloride 0.9 % 250 mL IVPB  Status:  Discontinued     100 mg 125 mL/hr over 120 Minutes Intravenous Every 12 hours 08/04/2019 0640 08/12/2019 1348   08/14/2019 0430  cefTRIAXone (ROCEPHIN) 1 g in sodium chloride 0.9 % 100 mL IVPB     1 g 200 mL/hr over 30 Minutes Intravenous  Once 08/07/2019 0426 07/25/2019 0546   08/04/2019 0430  azithromycin (ZITHROMAX) 500 mg in sodium chloride 0.9 % 250 mL IVPB     500 mg 250 mL/hr over 60 Minutes Intravenous  Once 08/18/2019 0426 08/17/2019 0555      Microbiology: Results for orders placed or performed during the hospital encounter of 07/28/2019  Culture, blood (routine x 2)     Status: None   Collection Time: 08/06/2019  4:35 AM   Specimen: BLOOD  Result Value Ref Range Status   Specimen Description BLOOD RIGHT WRIST   Final   Special Requests   Final    BOTTLES DRAWN AEROBIC AND ANAEROBIC Blood Culture results may not be optimal due to an excessive volume of blood received in culture bottles   Culture   Final    NO GROWTH 5 DAYS Performed at [facility], [address] [place], [place] [address]    Report Status 08/10/2019 FINAL  Final  Culture, blood (routine x 2)     Status: None   Collection Time: 07/29/2019  4:35 AM   Specimen: BLOOD  Result Value Ref Range Status   Specimen Description BLOOD LEFT HAND  Final   Special Requests   Final    BOTTLES DRAWN AEROBIC AND ANAEROBIC Blood Culture results may not be optimal due to an excessive volume of blood  received in culture bottles   Culture   Final    NO GROWTH 5 DAYS Performed at [facility], [address], [place], [place] [address]    Report Status 08/10/2019 FINAL  Final  SARS Coronavirus 2 by RT PCR (hospital order, performed in [facility] hospital lab) Nasopharyngeal Nasopharyngeal Swab     Status: None   Collection Time: 07/26/2019  9:41 AM   Specimen: Nasopharyngeal Swab  Result Value Ref Range Status   SARS Coronavirus 2 NEGATIVE NEGATIVE Final    Comment: (NOTE) If result is NEGATIVE SARS-CoV-2 target nucleic acids are NOT DETECTED. The SARS-CoV-2 RNA is generally detectable in upper and lower  respiratory specimens during the acute phase of infection. The lowest  concentration of SARS-CoV-2 viral copies this assay can detect is 250  copies / mL. A negative result does not preclude SARS-CoV-2 infection  and should not be used as the sole basis for treatment or other  patient management decisions.  A negative result may occur with  improper specimen collection / handling, submission of specimen other  than nasopharyngeal swab, presence of viral mutation(s) within the  areas targeted by this assay, and inadequate number of viral copies  (<250 copies / mL). A negative result must be combined with clinical  observations, patient history, and epidemiological information. If result is POSITIVE SARS-CoV-2 target nucleic acids are DETECTED. The SARS-CoV-2 RNA is generally detectable in upper and lower  respiratory specimens dur ing the acute phase of infection.  Positive  results are indicative of active infection with SARS-CoV-2.  Clinical  correlation with patient history and other diagnostic information is  necessary to determine patient infection status.  Positive results do  not rule out bacterial infection or co-infection with other viruses. If result is PRESUMPTIVE POSTIVE SARS-CoV-2 nucleic acids MAY BE PRESENT.   A presumptive positive result was obtained on  the submitted specimen  and confirmed on repeat testing.  While 2019 novel coronavirus  (SARS-CoV-2) nucleic acids may be present in the submitted sample  additional confirmatory testing may be necessary for epidemiological  and / or clinical management purposes  to differentiate between  SARS-CoV-2 and other Sarbecovirus currently known to infect humans.  If clinically indicated additional testing with an alternate test  methodology [id number]) is advised. The SARS-CoV-2 RNA is generally  detectable in upper and lower respiratory sp ecimens during the acute  phase of infection. The expected result is Negative. Fact Sheet for Patients:  StrictlyIdeas.no Fact Sheet for Healthcare Providers: BankingDealers.co.za This test is not yet approved or cleared by the [place] FDA and has been authorized for detection and/or diagnosis of SARS-CoV-2 by FDA under an Emergency Use Authorization (EUA).  This EUA will remain in effect (meaning this test can be used) for the duration of the COVID-19 declaration under Section 564(b)(1) of the Act, 21 U.S.C. section 360bbb-3(b)(1), unless the authorization is terminated or revoked sooner. Performed at [facility], [address] [place], [place] [address]   MRSA PCR Screening     Status: Abnormal   Collection Time: 07/27/2019 12:11 PM   Specimen: Nasal Mucosa; Nasopharyngeal  Result Value Ref Range Status   MRSA by PCR POSITIVE (A) NEGATIVE Final    Comment:        The GeneXpert MRSA Assay (FDA approved for NASAL specimens only), is one component of a comprehensive MRSA colonization surveillance program. It is not intended to diagnose MRSA infection nor to guide or monitor treatment for MRSA infections. RESULT CALLED TO, READ BACK BY AND VERIFIED WITH: CHARLIE WAITLEY 08/04/2019 1340 KLW Performed at [facility], [address] [place], [place] [address]   Culture,  respiratory (non-expectorated)     Status: None (Preliminary result)   Collection Time: 08/15/19  3:18 PM   Specimen: Tracheal Aspirate; Respiratory  Result Value Ref Range Status   Specimen Description   Final    TRACHEAL ASPIRATE Performed at [facility], [address], [place], [place] [address]    Special Requests   Final    NONE Performed at [facility], [address] [place], [place] [address]    Gram Stain   Final    ABUNDANT WBC PRESENT, PREDOMINANTLY PMN FEW SQUAMOUS EPITHELIAL CELLS PRESENT ABUNDANT GRAM POSITIVE COCCI MODERATE GRAM POSITIVE RODS    Culture   Final    MODERATE STAPHYLOCOCCUS AUREUS SUSCEPTIBILITIES TO FOLLOW Performed at [facility], [place] [address] [place],  [address]  Report Status PENDING  Incomplete    Best Practice/Protocols:  VTE Prophylaxis: Mechanical GI Prophylaxis: Antihistamine Continous Sedation Lovenox had to be held due to bloody ETT secretions   Events: 11/17: Intubated. Started on neo 11/18:PICC line placed.Remains intubated, FiO2 50%. On neo at 40 mcg. Sedated on 15 mcg propofol and 200 mcg fentanyl. Responds to verbal stimuli and follows commands. 11/19: Remains intubated and sedated. FiO2 55%. On neo at 22, vasopressin .03. Sedated with 25 propofol and 300 fentanyl. Unresponsive to verbal stimuli. 11/20: Pt confused and delirious when sedated weaned.Remains intubated and sedated - FiO2 50%, 3 mcg neo, .03 units vasopressin. Sedated on 20 mcg propofol, 300 mcg fentanyl.  11/21remains on vent, failed SAT/SBT 11/22 remains vent fio2 80%, all family members updated 11/23 remains on vent, pressors, severe hypoxia,family updated 11/24 unable to wean severe hypoxia 11/26 unable to wean due to persistent severe hypoxia, updated brother and sister 11/27 thick purulent secretions, fever, increase FiO2 to 90%, antibiotics were started: Zosyn 11/28 Vancomycin added +GPC _0  11/29 Sputum  growing staph aureus, susceptibilities pending, FiO2 had to be increased again  Studies: Dg Chest 2 View  Result Date: 08/13/2019 CLINICAL DATA:  Fever, shortness of breath EXAM: CHEST - 2 VIEW COMPARISON:  07/09/2019 FINDINGS: Heart is upper limits normal in size. Bibasilar opacities are again noted, similar to prior study. No visible effusions. No acute bony abnormality. IMPRESSION: Bibasilar opacities, similar to prior study. This could reflect atelectasis or infiltrates/pneumonia. Electronically Signed   By: Rolm Baptise M.D.   On: 08/02/2019 00:47   Dg Abdomen 1 View  Result Date: 07/29/2019 CLINICAL DATA:  OG tube placement. EXAM: ABDOMEN - 1 VIEW COMPARISON:  11/30/2015 FINDINGS: The OG tube tip is in the fundus of the stomach and could be advanced. The visualized bowel gas pattern is normal. Markings are slightly accentuated at the lung bases. IMPRESSION: 1. OG tube tip is in the fundus of the stomach and could be advanced slightly. 2. No acute abnormalities in the abdomen. Electronically Signed   By: Lorriane Shire M.D.   On: 08/14/2019 11:42   Dg Chest Port 1 View  Result Date: 08/17/2019 CLINICAL DATA:  Acute respiratory failure EXAM: PORTABLE CHEST 1 VIEW COMPARISON:  Chest x-rays dated 08/16/2019 and 08/14/2019. FINDINGS: Tubes and lines remain well positioned. Patchy bibasilar opacities are unchanged. Emphysematous changes again noted within the upper lungs. No new lung findings. No pleural effusion or pneumothorax is seen. IMPRESSION: 1. Stable chest x-ray. Patchy bibasilar opacities are unchanged, most likely chronic atelectasis/scarring. 2. Tubes and lines remain well positioned. Electronically Signed   By: Franki Cabot M.D.   On: 08/17/2019 07:26   Dg Chest Port 1 View  Result Date: 08/16/2019 CLINICAL DATA:  Respiratory failure EXAM: PORTABLE CHEST 1 VIEW COMPARISON:  Chest x-rays dated 08/14/2019 and 09/04/2013. Chest CT dated 06/24/2019. FINDINGS: Stable cardiomegaly.  Endotracheal tube is positioned approximately 6 cm above the carina. RIGHT-sided PICC line is stable in position with tip at the level of the mid/lower SVC. Enteric tube passes below the diaphragm. Patchy bibasilar opacities are unchanged in the short-term interval, likely atelectasis and/or small pleural effusions. Emphysematous changes again noted within the upper lungs bilaterally. No pneumothorax seen. IMPRESSION: 1. Stable chest x-ray. Endotracheal tube is positioned approximately 6 cm above the carina. 2. Stable cardiomegaly. 3. Stable patchy bibasilar opacities, likely atelectasis and/or small pleural effusions. Electronically Signed   By: Franki Cabot M.D.   On: 08/16/2019 07:42   Dg Chest Port 1  View  Result Date: 08/14/2019 CLINICAL DATA:  Respiratory distress. Acute on chronic respiratory failure. EXAM: PORTABLE CHEST 1 VIEW 2:25 p.m. COMPARISON:  Chest x-rays dated 08/14/2019 at 12:24 a.m. and 08/11/2019 and CT scan of the chest dated 06/24/2019 FINDINGS: Endotracheal tube, PICC and NG tube appear in good position. Heart size and pulmonary vascularity are normal. There is increasing infiltrate in the left perihilar region. Small bilateral pleural effusions are unchanged. Faint infiltrate at the right lung base is essentially unchanged. Severe emphysematous changes in the upper lobes as demonstrated on the prior CT scan. IMPRESSION: 1. Increasing infiltrate in the left perihilar region. 2. No change in the faint infiltrate at the right lung base. 3. Stable small bilateral pleural effusions. Electronically Signed   By: Lorriane Shire M.D.   On: 08/14/2019 14:52   Dg Chest Port 1 View  Result Date: 08/14/2019 CLINICAL DATA:  Respiratory failure EXAM: PORTABLE CHEST 1 VIEW COMPARISON:  08/11/2019 FINDINGS: Support Apparatus: --Endotracheal tube: Tip just below the level of the clavicular heads. --Enteric tube:Tip and sideport are below the field of view. --Catheter(s):Right peripheral insertion  approach central venous catheter tip is in the lower SVC --Other: None Unchanged bibasilar opacities. Small pleural effusions. IMPRESSION: 1. Radiographically satisfactory appearance of support apparatus. 2. Small pleural effusions and bibasilar atelectasis. Electronically Signed   By: Ulyses Jarred M.D.   On: 08/14/2019 00:44   Dg Chest Port 1 View  Result Date: 08/11/2019 CLINICAL DATA:  63 year old male with respiratory failure. EXAM: PORTABLE CHEST 1 VIEW COMPARISON:  08/09/2019 and earlier. FINDINGS: Portable AP upright view at 0536 hours. Stable endotracheal tube tip between the level the clavicles and carina. Enteric tube remains in place, tip in the left upper quadrant and side hole not delineated. Right PICC line remains in place. Stable lung volumes and mediastinal contours. No pneumothorax or pulmonary edema. Chronic lung disease with emphysema. Patchy bibasilar opacity is stable. Paucity of bowel gas in the upper abdomen. Chronic upper abdominal surgical clips. IMPRESSION: 1. Stable lines and tubes. 2. Chronic lung disease with stable patchy bibasilar opacity most resembling atelectasis. 3. No new cardiopulmonary abnormality. Electronically Signed   By: Genevie Ann M.D.   On: 08/11/2019 06:09   Dg Chest Port 1 View  Result Date: 08/09/2019 CLINICAL DATA:  Acute respiratory failure. EXAM: PORTABLE CHEST 1 VIEW COMPARISON:  08/07/2019 FINDINGS: 0538 hours. Endotracheal tube tip is 6.7 cm above the base of the carina. The NG tube passes into the stomach although the distal tip position is not included on the film. Low volumes with vascular congestion and bibasilar atelectasis/infiltrate. Tiny bilateral pleural effusions noted. Right PICC line tip overlies the mid SVC. Telemetry leads overlie the chest. IMPRESSION: 1. Low volume film with vascular congestion and bibasilar atelectasis/infiltrate. 2. Tiny bilateral pleural effusions. 3. Support apparatus as described. Electronically Signed   By: Misty Stanley M.D.   On: 08/09/2019 08:58   Dg Chest Port 1 View  Result Date: 08/07/2019 CLINICAL DATA:  Acute respiratory failure. EXAM: PORTABLE CHEST 1 VIEW COMPARISON:  Radiograph 08/13/2019. CT 06/24/2019 FINDINGS: Endotracheal tube tip 5.2 cm from the carina. Enteric tube in place with tip below the diaphragm, not included in the field of view. Right upper extremity PICC tip in the lower SVC. Advanced emphysema with patchy bibasilar opacities, likely scarring. No pneumothorax. Blunting of the costophrenic angles, likely due to chronic hyperinflation rather than pleural effusions. IMPRESSION: 1. Advanced emphysema with patchy bibasilar opacities, favor scarring. 2. Support apparatus unchanged. Electronically  Signed   By: Keith Rake M.D.   On: 08/07/2019 06:52   Dg Chest Portable 1 View  Result Date: 07/29/2019 CLINICAL DATA:  Hypoxia EXAM: PORTABLE CHEST 1 VIEW COMPARISON:  August 05, 2019 study obtained earlier in the day FINDINGS: Endotracheal tube tip is 4.7 cm above the carina. No pneumothorax. There is patchy airspace opacity in both lung bases with associated atelectasis and pleural effusions. Pleural effusions are larger than on study obtained earlier in the day. There appears to be bullous disease in the upper lobes, particularly on the right. Heart size is normal. There is diminished pulmonary vascularity in the upper lobes, likely due to bullous disease. Pulmonary vascularity appears stable. No adenopathy. No bone lesions evident. IMPRESSION: Endotracheal tube as described without pneumothorax. There are new small pleural effusions bilaterally. There is patchy airspace consolidation and atelectasis in the lung bases. Probable bullous disease in the upper lobes. Stable cardiac silhouette. Electronically Signed   By: Lowella Grip III M.D.   On: 07/27/2019 10:03   [place] Ekg Site Rite  Result Date: 08/06/2019 If Site Rite image not attached, placement could not be confirmed due to  current cardiac rhythm.   Consults: Palliative Care  Subjective:    Overnight Issues: FiO2 down to 60%, yesterday, unfortunately back to 100% today.  Bloody secretions, held Lovenox.  SCDs for DVT prophylaxis.  On Vanco/Zosyn, Staph aureus in sputum, susceptibilities pending.  Less interactive today.  Objective:  Vital signs for last 24 hours: Temp:  [99 F (37.2 C)-100.9 F (38.3 C)] 99.3 F (37.4 C) (11/29 1600) Pulse Rate:  [53-125] 120 (11/29 1600) Resp:  [16-24] 22 (11/29 1600) BP: (72-143)/(41-103) 94/54 (11/29 1500) SpO2:  [87 %-94 %] 94 % (11/29 1600) FiO2 (%):  [60 %-100 %] 100 % (11/29 1601) Weight:  [109.9 kg] 109.9 kg (11/29 0450)  Hemodynamic parameters for last 24 hours:    Intake/Output from previous day: 11/28 0701 - 11/29 0700 In: 2099.1 [I.V.:673.7; NG/GT:730; IV Piggyback:695.4] Out: 4155 [Urine:4155]  Intake/Output this shift: Total I/O In: 1131.7 [I.V.:419.5; NG/GT:360; IV Piggyback:352.2] Out: -   Vent settings for last 24 hours: Vent Mode: PRVC FiO2 (%):  [60 %-100 %] 100 % Set Rate:  [18 bmp] 18 bmp Vt Set:  [500 mL] 500 mL PEEP:  [5 cmH20-10 cmH20] 10 cmH20 Plateau Pressure:  [17 cmH20] 17 cmH20  Physical Exam:  GENERAL: Chronically ill appearing, synchronous with the vent when sedated, not as interactive today. HEAD: Normocephalic, atraumatic.  EYES: Pupils equal, round, reactive. No scleral icterus.No scleral edema MOUTH: Orotracheally intubated, OG in place.  Bloody secretions ETT. NECK: Supple.  Trachea midline, no crepitus. PULMONARY: Distant breath sounds poor air movement no wheezes.  Coarse breath sounds.   CARDIOVASCULAR: S1 and S2. Regular rate and rhythm. No murmurs, rubs, or gallops.  GASTROINTESTINAL: Soft, nondistended. No masses. Positive bowel sounds.  MUSCULOSKELETAL: No swelling, clubbing, 2+ anasarca. NEUROLOGIC: Sedation requirements continue to decrease.  He is less interactive today. SKIN:intact,warm,dry, multiple  ecchymoses.  Assessment/Plan:   Severe ACUTE ON CHRONIC hypoxic and hypercapnic respiratory failure due to exacerbation of stage IV (very severe) COPD, severe bullous emphysema (apical)  continue Full MV support  Wean Fio2 and PEEP as tolerated  Has not been able to tolerate weaning and suspect that he will have to be one-way extubation to comfort care  Etiology of exacerbation possible pneumonia/bronchitis aggravated by diastolic heart failure  Has pneumonia with Staph aureus query whether is incomplete treatment of admission pneumonia  Change  in sputum character yesterday, recultured, antibiotics restarted  Underlying cor pulmonale and pulmonary hypertension also make the issue more complex  Acute on chronic diastolic heart failure:  Diuretics as tolerated, has not been able to tolerate due to hypotension  Significant volume overload, Lasix x1 yesterday, no diuretic today due to soft BP, close monitoring  Difficult to manage due to coexistent COR PULMONALE/pulmonary hypertension   NEUROLOGIC  intubated and sedated  minimal sedation to achieve a RASS goal: 0/-1  Not asynchronous today when sedation was lightened  SEVERE COPD EXACERBATION  Restarted antibiotics 11/27 due to change in sputum character  Continue bronchodilators  Will discontinue IV steroids, no bronchospasm, continue nebulized steroids  SHOCK: Persistent shock physiology could be due to right heart failure  Pressors as needed to maintain MAP > 65  Acute on chronic kidney injury  Avoid nephrotoxins  Follow renal panel  No indication for dialysis  Likely ATN  ID  Resumed antibiotics 11/27: Zosyn/Vanco  Cultured sputum due to change in character of secretions   Pneumonia likely query incomplete treatment of admission pneumonia  Follow-up chest x-ray today showed persistent atelectasis at bases and possible bilateral pleural effusions  Moderate protein calorie malnutrition   TF's  as tolerated  Constipation protocol as indicated  Third spacing will aggravate volume issues, albumin 2.9 today  ENDO  will use ICU hypoglycemic\Hyperglycemia protocol if indicated  ELECTROLYTES  follow labs as needed  replace as needed  pharmacy consulted and following   Overall, patient is critically ill, prognosis is exceedingly poor due severe underlying lung disease. Patient with multiorgan failure and at high risk for cardiac arrest and death.  Patient is DNR, palliative care consulted.      LOS: 12 days   Additional comments: Met with family yesterday.  They are still not willing to transition the patient to comfort care.  They would want to wait for 21 days total, day were advised that this may not be feasible.  They do agree on NO tracheostomy. Updated sister [name] today  Critical Care Total Time*: 45 Minutes  C. Derrill Kay, MD [facility] 08/17/2019  *Care during the described time interval was provided by me and/or other providers on the critical care team.  I have reviewed this patient's available data, including medical history, events of note, physical examination and test results as part of my evaluation.  **This note was dictated using voice recognition software/Dragon.  Despite best efforts to proofread, errors can occur which can change the meaning.  Any change was purely unintentional.

## 2019-08-17 NOTE — Progress Notes (Signed)
FIO2 incr to 75% due to persistent sats of 85.

## 2019-08-17 NOTE — Progress Notes (Signed)
Pharmacy Antibiotic Note  Dustin [name] is a 63 y.o. male admitted on 08/26/2019 with pneumonia.  Pharmacy has been consulted for Vancomycin dosing.  Plan: Vancomycin 2000mg  x 1 (loading dose) then 750 mg IV Q 8 hrs. Goal AUC 400-550. Expected AUC: 502.6 SCr used: 0.8  Continue Zosyn 3.375gm IV q8hrs  Height: 5\' 10"  (177.8 cm) Weight: 242 lb 4.6 oz (109.9 kg) IBW/kg (Calculated) : 73  Temp (24hrs), Avg:100.5 F (38.1 C), Min:99.7 F (37.6 C), Max:101.8 F (38.8 C)  Recent Labs  Lab 08/12/19 0451 08/13/19 0431 08/14/19 0537 08/15/19 0515 08/16/19 0456 08/16/19 0500 08/17/19 0500  WBC 13.1* 11.4* 14.2* 12.0*  --  19.0* 14.2*  CREATININE 0.71 0.44*  --  0.53* 0.71  --  0.55*    Estimated Creatinine Clearance: 114.3 mL/min (A) (by C-G formula based on SCr of 0.55 mg/dL (L)).    Allergies  Allergen Reactions  . Asa [Aspirin] Other (See Comments)    Reaction: swelling of the right side.  . Bee Venom Swelling  . Codeine Other (See Comments)    Reaction: Difficulty breathing  . Ibuprofen Other (See Comments)    Reaction: Swelling of the right side.  . Iodinated Diagnostic Agents Other (See Comments)    Reaction:  Unknown  Other reaction(s): Unknown    Antimicrobials this admission: Zosyn 11/27 >>  Vancomycin 11/28 >>   Dose adjustments this admission:   Microbiology results:  BCx:   UCx:    Sputum:    MRSA PCR: + 11/27 Trach cx: MODERATE STAPHYLOCOCCUS AUREUS   Thank you for allowing pharmacy to be a part of this patient's care.  Xoe Hoe A 08/17/2019 8:00 AM

## 2019-08-18 ENCOUNTER — Inpatient Hospital Stay: Payer: Medicare Other

## 2019-08-18 ENCOUNTER — Other Ambulatory Visit: Payer: Self-pay | Admitting: *Deleted

## 2019-08-18 ENCOUNTER — Telehealth: Payer: Medicare Other

## 2019-08-18 LAB — CBC
HCT: 35.2 % — ABNORMAL LOW (ref 39.0–52.0)
Hemoglobin: 10.8 g/dL — ABNORMAL LOW (ref 13.0–17.0)
MCH: 32.1 pg (ref 26.0–34.0)
MCHC: 30.7 g/dL (ref 30.0–36.0)
MCV: 104.8 fL — ABNORMAL HIGH (ref 80.0–100.0)
Platelets: 170 10*3/uL (ref 150–400)
RBC: 3.36 MIL/uL — ABNORMAL LOW (ref 4.22–5.81)
RDW: 13.2 % (ref 11.5–15.5)
WBC: 16.9 10*3/uL — ABNORMAL HIGH (ref 4.0–10.5)
nRBC: 0 % (ref 0.0–0.2)

## 2019-08-18 LAB — BLOOD GAS, ARTERIAL
Acid-Base Excess: 14.5 mmol/L — ABNORMAL HIGH (ref 0.0–2.0)
Bicarbonate: 43.2 mmol/L — ABNORMAL HIGH (ref 20.0–28.0)
FIO2: 1
Mechanical Rate: 16
O2 Saturation: 96.3 %
PEEP: 10 cmH2O
Patient temperature: 37
RATE: 16 resp/min
pCO2 arterial: 73 mmHg (ref 32.0–48.0)
pH, Arterial: 7.38 (ref 7.350–7.450)
pO2, Arterial: 86 mmHg (ref 83.0–108.0)

## 2019-08-18 LAB — GLUCOSE, CAPILLARY
Glucose-Capillary: 13 mg/dL — CL (ref 70–99)
Glucose-Capillary: 149 mg/dL — ABNORMAL HIGH (ref 70–99)
Glucose-Capillary: 133 mg/dL — ABNORMAL HIGH (ref 70–99)
Glucose-Capillary: 132 mg/dL — ABNORMAL HIGH (ref 70–99)
Glucose-Capillary: 157 mg/dL — ABNORMAL HIGH (ref 70–99)
Glucose-Capillary: 139 mg/dL — ABNORMAL HIGH (ref 70–99)

## 2019-08-18 LAB — BASIC METABOLIC PANEL
Anion gap: 9 (ref 5–15)
BUN: 46 mg/dL — ABNORMAL HIGH (ref 8–23)
CO2: 36 mmol/L — ABNORMAL HIGH (ref 22–32)
Calcium: 8.5 mg/dL — ABNORMAL LOW (ref 8.9–10.3)
Chloride: 96 mmol/L — ABNORMAL LOW (ref 98–111)
Creatinine, Ser: 0.65 mg/dL (ref 0.61–1.24)
GFR calc Af Amer: >60 mL/min (ref >60)
GFR calc non Af Amer: >60 mL/min (ref >60)
Glucose, Bld: 149 mg/dL — ABNORMAL HIGH (ref 70–99)
Potassium: 4.3 mmol/L (ref 3.5–5.1)
Sodium: 141 mmol/L (ref 135–145)

## 2019-08-18 LAB — MAGNESIUM: Magnesium: 2.2 mg/dL (ref 1.7–2.4)

## 2019-08-18 MED ORDER — MIDAZOLAM HCL 2 MG/2ML IJ SOLN
4.0000 mg | Freq: Once | INTRAMUSCULAR | Status: AC
  Administered 2019-08-18 (×2): 2 mg via INTRAVENOUS

## 2019-08-18 MED ORDER — MIDAZOLAM HCL 2 MG/2ML IJ SOLN
2.0000 mg | Freq: Once | INTRAMUSCULAR | Status: AC
  Administered 2019-08-18: 16:00:00 2 mg via INTRAVENOUS

## 2019-08-18 MED ORDER — DEXMEDETOMIDINE HCL IN NACL 400 MCG/100ML IV SOLN
0.4000 ug/kg/h | INTRAVENOUS | Status: DC
  Administered 2019-08-18: 0.4 ug/kg/h via INTRAVENOUS
  Administered 2019-08-19: 1.2 ug/kg/h via INTRAVENOUS
  Administered 2019-08-19: 0.8 ug/kg/h via INTRAVENOUS
  Administered 2019-08-19 (×2): 1.2 ug/kg/h via INTRAVENOUS
  Administered 2019-08-19 (×2): 0.8 ug/kg/h via INTRAVENOUS
  Administered 2019-08-20: 1 ug/kg/h via INTRAVENOUS
  Administered 2019-08-20: 0.8 ug/kg/h via INTRAVENOUS
  Administered 2019-08-20: 1.2 ug/kg/h via INTRAVENOUS
  Filled 2019-08-18 (×11): qty 100

## 2019-08-18 MED ORDER — AMIODARONE HCL IN DEXTROSE 360-4.14 MG/200ML-% IV SOLN
60.0000 mg/h | INTRAVENOUS | Status: AC
  Administered 2019-08-18: 60 mg/h via INTRAVENOUS
  Filled 2019-08-18 (×3): qty 200

## 2019-08-18 MED ORDER — TRANEXAMIC ACID 1000 MG/10ML IV SOLN
500.0000 mg | Freq: Once | INTRAVENOUS | Status: AC
  Administered 2019-08-18: 500 mg via TOPICAL
  Filled 2019-08-18: qty 10

## 2019-08-18 MED ORDER — LACTULOSE 10 GM/15ML PO SOLN
10.0000 g | Freq: Three times a day (TID) | ORAL | Status: AC
  Administered 2019-08-18 (×3): 10 g
  Filled 2019-08-18 (×3): qty 30

## 2019-08-18 MED ORDER — SODIUM CHLORIDE 0.9 % IV SOLN
0.5000 mg/kg/h | INTRAVENOUS | Status: DC
  Administered 2019-08-18 – 2019-08-20 (×6): 0.5 mg/kg/h via INTRAVENOUS
  Filled 2019-08-18 (×14): qty 5

## 2019-08-18 MED ORDER — DIGOXIN 0.25 MG/ML IJ SOLN
0.5000 mg | Freq: Once | INTRAMUSCULAR | Status: AC
  Administered 2019-08-18: 0.5 mg via INTRAVENOUS

## 2019-08-18 MED ORDER — MIDAZOLAM HCL 2 MG/2ML IJ SOLN
INTRAMUSCULAR | Status: AC
  Filled 2019-08-18: qty 4

## 2019-08-18 MED ORDER — NOREPINEPHRINE BITARTRATE 1 MG/ML IV SOLN
0.0000 ug/min | INTRAVENOUS | Status: DC
  Filled 2019-08-18: qty 4

## 2019-08-18 MED ORDER — AMIODARONE HCL IN DEXTROSE 360-4.14 MG/200ML-% IV SOLN
60.0000 mg/h | INTRAVENOUS | Status: DC
  Administered 2019-08-19 – 2019-08-20 (×4): 60 mg/h via INTRAVENOUS
  Filled 2019-08-18 (×5): qty 200

## 2019-08-18 MED ORDER — METOPROLOL TARTRATE 5 MG/5ML IV SOLN
2.5000 mg | Freq: Four times a day (QID) | INTRAVENOUS | Status: DC | PRN
  Administered 2019-08-18 – 2019-08-20 (×3): 2.5 mg via INTRAVENOUS
  Filled 2019-08-18 (×3): qty 5

## 2019-08-18 MED ORDER — MIDAZOLAM HCL 2 MG/2ML IJ SOLN
INTRAMUSCULAR | Status: AC
  Administered 2019-08-18: 2 mg via INTRAVENOUS
  Filled 2019-08-18: qty 2

## 2019-08-18 MED ORDER — METOPROLOL TARTRATE 5 MG/5ML IV SOLN
2.5000 mg | Freq: Once | INTRAVENOUS | Status: AC
  Administered 2019-08-18: 2.5 mg via INTRAVENOUS
  Filled 2019-08-18: qty 5

## 2019-08-18 MED ORDER — FENTANYL CITRATE (PF) 100 MCG/2ML IJ SOLN
50.0000 ug | INTRAMUSCULAR | Status: DC | PRN
  Administered 2019-08-18: 100 ug via INTRAVENOUS
  Filled 2019-08-18: qty 2

## 2019-08-18 NOTE — Progress Notes (Signed)
Nutrition Follow-up  DOCUMENTATION CODES:   Obesity unspecified  INTERVENTION:  Continue Vital High Protein at 20 mL/hr (480 mL goal daily volume) + Pro-Stat 60 mL QID per tube. Provides 1280 kcal, 162 grams of protein, 403 mL H2O daily.   Continue liquid MVI daily per tube to meet 100% RDIs for vitamins/minerals.  Provide minimum free water flush of 30 mL Q4hrs to maintain tube patency.  NUTRITION DIAGNOSIS:   Inadequate oral intake related to inability to eat as evidenced by NPO status.  Ongoing - addressing with TF regimen.  GOAL:   Provide needs based on ASPEN/SCCM guidelines  Met with TF regimen.  MONITOR:   Vent status, Labs, Weight trends, TF tolerance, I & O's  REASON FOR ASSESSMENT:   Ventilator    ASSESSMENT:   63 year old male with PMHx of asthma, chronic diastolic CHF, GERD, tobacco abuse, anxiety, depression, HTN, emphysema of lung, COPD on 2L [place] at baseline admitted with severe acute hypoxic and hypercapnic respiratory failure due to severe COPD exacerbation requiring intubation on 11/17, septic and hypovolemic shock, AKI.  Patient remains intubated. He has been unable to wean from ventilator. Currently on PCV with FiO2 100% and PEEP 10 cmH2O. Abdomen soft. Last BM was on 11/26 per chart. Patient is on bowel regimen. Now off propofol gtt since last RD assessment. No need to adjust tube feed regimen as it still meets calorie/protein needs off propofol gtt.  Enteral Access: 16 Fr. OGT placed 11/17; still in stable position per chest x-ray today; 63 cm at corner of mouth  MAP: 62-110 mmHg  TF: Vital High Protein at 20 mL/hr + Pro-Stat 60 mL QID  Patient is currently intubated on ventilator support Ve: 12 L/min Temp (24hrs), Avg:99.9 F (37.7 C), Min:99.1 F (37.3 C), Max:100.9 F (38.3 C)  Propofol: N/A  Medications reviewed and include: vitamin D3 2000 units daily, famotidine, Novolog 0-9 units Q4hrs, lactulose 10 grams TID, Solu-Medrol 40 mg BID IV,  liquid MVI daily per tube, Miralax, senna-docusate 1 tablet BID, ketamine gtt, norepinephrine off, vancomycin.  Labs reviewed: CBG 132-149, Chloride 96, CO2 36, BUN 46.  I/O: 2500 mL UOP yesterday (1 mL/kg/hr)  Weight trend: 107.6 kg on 11/30; +5.6 kg from 11/18  Diet Order:   Diet Order    None     EDUCATION NEEDS:   No education needs have been identified at this time  Skin:  Skin Assessment: Reviewed RN Assessment  Last BM:  08/14/2019 per chart  Height:   Ht Readings from Last 1 Encounters:  08/16/2019 '5\' 10"'  (1.778 m)   Weight:   Wt Readings from Last 1 Encounters:  08/18/19 107.6 kg   Ideal Body Weight:  75.5 kg  BMI:  Body mass index is 34.04 kg/m.  Estimated Nutritional Needs:   Kcal:  1122-1428 (11-14 kcal/kg)  Protein:  151 grams (2 grams/kg IBW)  Fluid:  1.8 L/day  Dustin Barnacle, MS, RD, LDN Office: [phone] Pager: [phone] After Hours/Weekend Pager: [phone]

## 2019-08-18 NOTE — Progress Notes (Signed)
Pharmacy Electrolyte Monitoring Consult:  Dustin [name]. Dustin [name] is a 48 YOM admitted to the ICU on 07/23/2019 with respiratory failure requiring intubation from COPD, CHF, and possible PNA. 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  Patient is on tube feeds (VITAL HIGH PROTEIN 1,000 mL at 20 mL/hr and PRO-STAT 60 mL QID).   Labs:  Sodium (mmol/L)  Date Value  08/18/2019 141  04/01/2019 145 (H)  07/23/2014 139   Potassium (mmol/L)  Date Value  08/18/2019 4.3  07/23/2014 4.1   Magnesium (mg/dL)  Date Value  08/18/2019 2.2   Phosphorus (mg/dL)  Date Value  08/17/2019 4.4   Calcium (mg/dL)  Date Value  08/18/2019 8.5 (L)   Calcium, Total (mg/dL)  Date Value  07/23/2014 8.4 (L)   Albumin (g/dL)  Date Value  08/16/2019 2.9 (L)  07/23/2014 2.8 (L)    Assessment/Plan:  Electrolytes:  - No further replacement warranted - BMP with am labs on 12/1.   Goals of Therapy: - Potassium ~ 4 - Magnesium ~ 2  - All other electrolytes within normal limits  Glucose:  - Patient is on 0-9 units q4h SSI. - Patient requiring 5 units over past 24 hours  - Will continue to monitor and consult with diabetes team as necessary.  Constipation:  - Last documented bowel movement 11/26  - Senna/docusate 2 tab BID on 11/28, Lactulose 10g VT TID x 6 doses.   Pharmacy will continue to monitor and adjust per consult.   [name],[name], RPh 08/18/2019 3:27 PM

## 2019-08-18 NOTE — Plan of Care (Signed)

## 2019-08-18 NOTE — Progress Notes (Signed)
Pharmacy Antibiotic Note  Dustin [name] is a 63 y.o. male admitted on 08/13/2019. Patient admitted to ICU requiring mechanical ventilation. Patient started on Zosyn on 11/27 and vancomycin started on 11/28. 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 Patient with MRSA in tracheal aspirate Pharmacy has been consulted for Vancomycin dosing.  Plan: Continue vancomycin 750mg  IV Q8hr. Will obtain peak/trough with 0800 dose on 12/1.   Zosyn discontinued on am ICU rounds.   Height: 5\' 10"  (177.8 cm) Weight: 237 lb 3.4 oz (107.6 kg) IBW/kg (Calculated) : 73  Temp (24hrs), Avg:100 F (37.8 C), Min:99.3 F (37.4 C), Max:100.9 F (38.3 C)  Recent Labs  Lab 08/13/19 0431 08/14/19 0537 08/15/19 0515 08/16/19 0456 08/16/19 0500 08/17/19 0500 08/17/19 2103 08/18/19 0544  WBC 11.4* 14.2* 12.0*  --  19.0* 14.2*  --   --   CREATININE 0.44*  --  0.53* 0.71  --  0.55* 0.70 0.65    Estimated Creatinine Clearance: 113 mL/min (by C-G formula based on SCr of 0.65 mg/dL).    Allergies  Allergen Reactions  . Asa [Aspirin] Other (See Comments)    Reaction: swelling of the right side.  . Bee Venom Swelling  . Codeine Other (See Comments)    Reaction: Difficulty breathing  . Ibuprofen Other (See Comments)    Reaction: Swelling of the right side.  . Iodinated Diagnostic Agents Other (See Comments)    Reaction:  Unknown  Other reaction(s): Unknown    Antimicrobials this admission: Zosyn 11/27 >> 11/30  Vancomycin 11/28 >>   Dose adjustments this admission: N/A  Microbiology results: 11/17 BCx: no growth  11/17 MRSA PCR: positive  11/17 SARS Coronavirus: negative  11/27 tracheal aspirate: abundant MRSA.   Thank you for allowing pharmacy to be a part of this patient's care.  Kortland Nichols L 08/18/2019 3:18 PM

## 2019-08-18 NOTE — Progress Notes (Signed)
CRITICAL CARE PROGRESS NOTE    Name: Dustin [name] MRN: [id number] DOB: December 29, 1955     LOS: 68   SUBJECTIVE FINDINGS & SIGNIFICANT EVENTS   Patient description:  Dustin [name] is an 63 y.o. male smoker PTA, withhistory of COPD andchronic respiratory failure, chronic diastolic CHF, history of DVT on apixaban presented to the ER with complaints of having fever chills and fatigue.Patient's daughter went to check on him and found he was febrile and was brought to the ER. Patient admitted with acute on chronic hypoxic and hypercarbic respiratory failure failing BiPAP and ultimately being intubated and mechanically ventilated.    Lines / Drains: PIVx3  Cultures / Sepsis markers: Tracheal aspirate with MRSA  Antibiotics: vancomycin   Protocols / Consultants: Palliative team for [place]  Tests / Events: CXR with bilateral consolidated pneumonia  Overnight: Remains critically ill on MV    Events: 11/17: Intubated. Started on neo 11/18:PICC line placed.Remains intubated, FiO2 50%. On neo at 40 mcg. Sedated on 15 mcg propofol and 200 mcg fentanyl. Responds to verbal stimuli and follows commands. 11/19: Remains intubated and sedated. FiO2 55%. On neo at 22, vasopressin .03. Sedated with 25 propofol and 300 fentanyl. Unresponsive to verbal stimuli. 11/20: Pt confused and delirious when sedated weaned.Remains intubated and sedated - FiO2 50%, 3 mcg neo, .03 units vasopressin. Sedated on 20 mcg propofol, 300 mcg fentanyl.  11/21remains on vent, failed SAT/SBT 11/22 remains vent fio2 80%, all family members updated 11/23 remains on vent, pressors, severe hypoxia,family updated 11/24 unable to wean severe hypoxia 11/26 unable to wean due to persistent severe hypoxia, updated brother and sister 11/27  thick purulent secretions, fever, increase FiO2 to 90%, antibiotics were started: Zosyn 11/28 Vancomycin added +GPC _0  11/29 Sputum growing staph aureus, susceptibilities pending, FiO2 had to be increased again 11/30-ventilator management, placed on pressure control with PEEP of 10 to optimize oxygenation and decrease auto PEEP secondary to dynamic hyperinflation 11/  PAST MEDICAL HISTORY   Past Medical History:  Diagnosis Date  . Allergy   . Anxiety   . Asthma   . Chronic diastolic CHF (congestive heart failure) ([place])    a. echo 07/2013: EF 60-65%, DD, biatrial dilatation, Ao sclerosis, dilated RV, moderate pulmonary HTN, elevated CV and RA pressures; b. patient reported echo at Dr. Laurelyn Sickle office 02/2015 - his office does not have record of him being a pt there c. echo 11/2015: EF 60-65%, Grade 1 DD, mod-severe pulm pressures  . Chronic respiratory failure (HCC)    a. on 2L via nasal cannula; b. secondary to COPD  . COPD (chronic obstructive pulmonary disease) (HCC)    on 2L continuous   . Depression   . Emphysema of lung ([place])   . GERD (gastroesophageal reflux disease)   . HCAP (healthcare-associated pneumonia)    01/22/18-01/28/18   . Headache   . Hypertension   . Leg DVT (deep venous thromboembolism), acute, bilateral ([place])    02/26/18  . Personal history of tobacco use, presenting hazards to health 08/17/2015  . Pulmonary embolism ([place])    02/26/18  . Pulmonary HTN ([place])   . Tobacco abuse      SURGICAL HISTORY   Past Surgical History:  Procedure Laterality Date  . ABDOMINAL SURGERY    . ADENOIDECTOMY    . CARPAL TUNNEL RELEASE Right   . corpal tunnel Right   . ELBOW SURGERY Right    repaired tendon  . hemmorhoid N/A   . HEMORRHOID SURGERY    .  NASAL SINUS SURGERY    . NASAL SINUS SURGERY     1970s  . NOSE SURGERY    . reflux surgery     1994  . ROTATOR CUFF REPAIR Right   . SHOULDER SURGERY    . TENNIS ELBOW RELEASE/NIRSCHEL PROCEDURE Right   .  TONSILLECTOMY    . URETHRA SURGERY     surgery 65 times from age [age] yrs old  . URETHRA SURGERY       FAMILY HISTORY   Family History  Problem Relation Age of Onset  . CAD Father   . Hyperlipidemia Father   . Stroke Father   . Heart disease Father   . Arthritis Father   . Hearing loss Father   . Hypertension Father   . Hypertension Mother   . Peripheral Artery Disease Mother   . Rheum arthritis Mother   . Asthma Mother   . Bipolar disorder Mother   . Depression Mother   . Malignant hypertension Mother   . Arthritis Mother   . Hearing loss Mother   . Heart disease Mother   . Hyperlipidemia Mother   . Kidney disease Mother   . Birth defects Brother   . Heart disease Brother   . Alcohol abuse Daughter   . Arthritis Daughter   . Asthma Daughter   . Depression Daughter   . Drug abuse Daughter   . Miscarriages / [place] Daughter   . Intellectual disability Daughter   . Kidney disease Son   . Birth defects Paternal Grandmother   . Depression Paternal Grandmother   . Heart disease Paternal Grandmother   . Birth defects Sister   . Diabetes Neg Hx      SOCIAL HISTORY   Social History   Tobacco Use  . Smoking status: Current Every Day Smoker    Packs/day: 0.10    Years: 40.00    Pack years: 4.00    Types: Cigarettes    Last attempt to quit: 01/14/2018    Years since quitting: 1.5  . Smokeless tobacco: Never Used  Substance Use Topics  . Alcohol use: No  . Drug use: No     MEDICATIONS   Current Medication:  Current Facility-Administered Medications:  .  0.9 %  sodium chloride infusion, 250 mL, Intravenous, Continuous, Kasa, Kurian, MD .  acetaminophen (TYLENOL) tablet 650 mg, 650 mg, Oral, Q6H PRN, 650 mg at 08/16/19 1317 **OR** acetaminophen (TYLENOL) suppository 650 mg, 650 mg, Rectal, Q6H PRN, Tukov-Yual, Magdalene S, NP .  ALPRAZolam (XANAX) tablet 0.5 mg, 0.5 mg, Per Tube, TID, Flora Lipps, MD, 0.5 mg at 08/17/19 2304 .  budesonide (PULMICORT)  nebulizer solution 0.5 mg, 0.5 mg, Nebulization, BID, Kasa, Kurian, MD, 0.5 mg at 08/18/19 0814 .  chlorhexidine gluconate (MEDLINE KIT) (PERIDEX) 0.12 % solution 15 mL, 15 mL, Mouth Rinse, BID, Kasa, Kurian, MD, 15 mL at 08/18/19 0747 .  Chlorhexidine Gluconate Cloth 2 % PADS 6 each, 6 each, Topical, Daily, Flora Lipps, MD, 6 each at 08/17/19 1617 .  cholecalciferol (VITAMIN D3) tablet 2,000 Units, 2,000 Units, Per Tube, Daily, Flora Lipps, MD, 2,000 Units at 08/17/19 0931 .  escitalopram (LEXAPRO) tablet 20 mg, 20 mg, Per Tube, QHS, Flora Lipps, MD, 20 mg at 08/17/19 2304 .  famotidine (PEPCID) tablet 20 mg, 20 mg, Per Tube, BID, Flora Lipps, MD, 20 mg at 08/17/19 2304 .  feeding supplement (PRO-STAT SUGAR FREE 64) liquid 60 mL, 60 mL, Per Tube, QID, Kasa, Kurian, MD, 60 mL at  08/17/19 2303 .  feeding supplement (VITAL HIGH PROTEIN) liquid 1,000 mL, 1,000 mL, Per Tube, Q24H, Kasa, Kurian, MD, 1,000 mL at 08/17/19 1325 .  fentaNYL 2562mg in NS 2540m(1038mml) infusion-PREMIX, 0-400 mcg/hr, Intravenous, Continuous, FunVanessa DurhamD, Last Rate: 20 mL/hr at 08/18/19 0900, 200 mcg/hr at 08/18/19 0900 .  insulin aspart (novoLOG) injection 0-9 Units, 0-9 Units, Subcutaneous, Q4H, KasFlora LippsD, 1 Units at 08/18/19 0745 .  ipratropium-albuterol (DUONEB) 0.5-2.5 (3) MG/3ML nebulizer solution 3 mL, 3 mL, Nebulization, Q4H, Kasa, Kurian, MD, 3 mL at 08/18/19 0814 .  lactulose (CHRONULAC) 10 GM/15ML solution 10 g, 10 g, Per Tube, TID, GonTyler PitaD .  MEDLINE mouth rinse, 15 mL, Mouth Rinse, 10 times per day, KasFlora LippsD, 15 mL at 08/18/19 0353 .  methylPREDNISolone sodium succinate (SOLU-MEDROL) 40 mg/mL injection 40 mg, 40 mg, Intravenous, BID, Kasa, Kurian, MD, 40 mg at 08/17/19 2256 .  midazolam (VERSED) injection 2-4 mg, 2-4 mg, Intravenous, Q1H PRN, GonTyler PitaD, 4 mg at 08/18/19 0410 .  multivitamin liquid 15 mL, 15 mL, Per Tube, Daily, Kasa, Kurian, MD, 15 mL at  08/17/19 0930 .  mupirocin ointment (BACTROBAN) 2 %, , Nasal, BID, Kasa, Kurian, MD .  norepinephrine (LEVOPHED) 4 mg in dextrose 5 % 250 mL (0.016 mg/mL) infusion, 0-40 mcg/min, Intravenous, Titrated, AleOttie GlazierD, Stopped at 08/18/19 [id number] piperacillin-tazobactam (ZOSYN) IVPB 3.375 g, 3.375 g, Intravenous, Q8H, GonTyler PitaD, Last Rate: 12.5 mL/hr at 08/18/19 0542, 3.375 g at 08/18/19 0542 .  polyethylene glycol (MIRALAX / GLYCOLAX) packet 17 g, 17 g, Per Tube, Daily, SimCharlett NosePH, 17 g at 08/17/19 0930 .  polyvinyl alcohol (LIQUIFILM TEARS) 1.4 % ophthalmic solution 1 drop, 1 drop, Both Eyes, PRN, Tukov-Yual, Magdalene S, NP, 1 drop at 08/17/19 0931 .  senna-docusate (Senokot-S) tablet 1 tablet, 1 tablet, Per Tube, BID, SimCharlett NosePH, 1 tablet at 08/17/19 2304 .  sodium chloride flush (NS) 0.9 % injection 10-40 mL, 10-40 mL, Intracatheter, Q12H, Kasa, Kurian, MD, 10 mL at 08/17/19 2302 .  sodium chloride flush (NS) 0.9 % injection 10-40 mL, 10-40 mL, Intracatheter, PRN, KasFlora LippsD, 10 mL at 08/06/19 2112 .  vancomycin (VANCOCIN) IVPB 750 mg/150 ml premix, 750 mg, Intravenous, Q8H, Hall, Scott A, RPH, Last Rate: 150 mL/hr at 08/18/19 0900    ALLERGIES   Asa [aspirin], Bee venom, Codeine, Ibuprofen, and Iodinated diagnostic agents    REVIEW OF SYSTEMS     Unable to obtain due to critically ill status on mechanical ventilation  PHYSICAL EXAMINATION   Vital Signs: Temp:  [99 F (37.2 C)-100.8 F (38.2 C)] 99.3 F (37.4 C) (11/30 0915) Pulse Rate:  [53-162] 162 (11/30 0915) Resp:  [16-23] 16 (11/30 0915) BP: (72-130)/(41-95) 126/95 (11/30 0915) SpO2:  [87 %-95 %] 92 % (11/30 0915) FiO2 (%):  [75 %-100 %] 100 % (11/30 0814) Weight:  [107.6 kg] 107.6 kg (11/30 0356)  GENERAL: Sedated on mechanical ventilation HEAD: Normocephalic, atraumatic.  EYES: Pupils equal, round, reactive to light.  No scleral icterus.  MOUTH: Moist mucosal  membrane. NECK: Supple. No thyromegaly. No nodules. No JVD.  PULMONARY: Rhonchorous breath sounds bilaterally with mechanical ventilation in the background CARDIOVASCULAR: S1 and S2. Regular rate and rhythm. No murmurs, rubs, or gallops.  GASTROINTESTINAL: Soft, nontender, non-distended. No masses. Positive bowel sounds. No hepatosplenomegaly.  MUSCULOSKELETAL: No swelling, clubbing, or edema.  NEUROLOGIC: GCS 4 T SKIN:intact,warm,dry   PERTINENT DATA  Infusions: . sodium chloride    . fentaNYL infusion INTRAVENOUS 200 mcg/hr (08/18/19 0900)  . norepinephrine (LEVOPHED) Adult infusion Stopped (08/18/19 0834)  . piperacillin-tazobactam (ZOSYN)  IV 3.375 g (08/18/19 0542)  . vancomycin 150 mL/hr at 08/18/19 0900   Scheduled Medications: . ALPRAZolam  0.5 mg Per Tube TID  . budesonide (PULMICORT) nebulizer solution  0.5 mg Nebulization BID  . chlorhexidine gluconate (MEDLINE KIT)  15 mL Mouth Rinse BID  . Chlorhexidine Gluconate Cloth  6 each Topical Daily  . cholecalciferol  2,000 Units Per Tube Daily  . escitalopram  20 mg Per Tube QHS  . famotidine  20 mg Per Tube BID  . feeding supplement (PRO-STAT SUGAR FREE 64)  60 mL Per Tube QID  . feeding supplement (VITAL HIGH PROTEIN)  1,000 mL Per Tube Q24H  . insulin aspart  0-9 Units Subcutaneous Q4H  . ipratropium-albuterol  3 mL Nebulization Q4H  . lactulose  10 g Per Tube TID  . mouth rinse  15 mL Mouth Rinse 10 times per day  . methylPREDNISolone (SOLU-MEDROL) injection  40 mg Intravenous BID  . multivitamin  15 mL Per Tube Daily  . mupirocin ointment   Nasal BID  . polyethylene glycol  17 g Per Tube Daily  . senna-docusate  1 tablet Per Tube BID  . sodium chloride flush  10-40 mL Intracatheter Q12H   PRN Medications: acetaminophen **OR** acetaminophen, midazolam, polyvinyl alcohol, sodium chloride flush Hemodynamic parameters:   Intake/Output: 11/29 0701 - 11/30 0700 In: 4167.3 [I.V.:1113.1; NG/GT:2400.1; IV  Piggyback:654.1] Out: 2500 [Urine:2500]  Ventilator  Settings: Vent Mode: PRVC FiO2 (%):  [75 %-100 %] 100 % Set Rate:  [15 bmp-18 bmp] 15 bmp Vt Set:  [500 mL] 500 mL PEEP:  [5 cmH20-10 cmH20] 10 cmH20 Plateau Pressure:  [18 cmH20-22 cmH20] 20 cmH20   LAB RESULTS:  Basic Metabolic Panel: Recent Labs  Lab 08/14/19 0537 08/15/19 0515 08/16/19 0456 08/17/19 0500 08/17/19 2103 08/18/19 0544  NA  --  139 139 139 140 141  K  --  4.3 4.5 3.7 4.0 4.3  CL  --  95* 95* 95* 93* 96*  CO2  --  35* 36* 36* 35* 36*  GLUCOSE  --  142* 133* 148* 123* 149*  BUN  --  42* 42* 37* 46* 46*  CREATININE  --  0.53* 0.71 0.55* 0.70 0.65  CALCIUM  --  8.7* 8.8* 8.2* 8.7* 8.5*  MG 1.8 2.4  --  1.8 2.3 2.2  PHOS 2.9  --  3.6  --  4.4  --    Liver Function Tests: Recent Labs  Lab 08/15/19 0504 08/16/19 0456  ALBUMIN 2.7* 2.9*   No results for input(s): LIPASE, AMYLASE in the last 168 hours. No results for input(s): AMMONIA in the last 168 hours. CBC: Recent Labs  Lab 08/13/19 0431 08/14/19 0537 08/15/19 0515 08/16/19 0500 08/17/19 0500  WBC 11.4* 14.2* 12.0* 19.0* 14.2*  NEUTROABS 9.4* 11.9*  --  17.3*  --   HGB 10.8* 11.0* 10.8* 11.4* 10.2*  HCT 34.6* 35.4* 34.9* 36.6* 31.3*  MCV 102.7* 102.9* 102.0* 102.8* 98.4  PLT 158 175 150 159 139*   Cardiac Enzymes: No results for input(s): CKTOTAL, CKMB, CKMBINDEX, TROPONINI in the last 168 hours. BNP: Invalid input(s): POCBNP CBG: Recent Labs  Lab 08/17/19 1523 08/17/19 1937 08/17/19 2329 08/18/19 0352 08/18/19 0716  GLUCAP 133* 121* 137* 149* 133*     IMAGING RESULTS:  Imaging: Dg Chest Port 1 [address]  Result Date: 08/18/2019 CLINICAL DATA:  Respiratory failure. EXAM: PORTABLE CHEST 1 VIEW COMPARISON:  Chest x-ray 08/17/2019, 08/16/2019, 08/09/2019. CT 06/24/2019. FINDINGS: Endotracheal tube, NG tube, right PICC line stable position. Heart size stable. COPD. Progressive bibasilar pulmonary infiltrates/edema. Small bilateral  pleural effusions noted on today's exam. No pneumothorax. IMPRESSION: 1.  Lines and tubes in stable position. 2. Progressive bibasilar pulmonary infiltrates/edema. Small bilateral pleural effusion noted on today's exam. 3.  COPD Electronically Signed   By: Marcello Moores  Register   On: 08/18/2019 06:11   Dg Chest Port 1 View  Result Date: 08/17/2019 CLINICAL DATA:  Acute respiratory failure EXAM: PORTABLE CHEST 1 VIEW COMPARISON:  Chest x-rays dated 08/16/2019 and 08/14/2019. FINDINGS: Tubes and lines remain well positioned. Patchy bibasilar opacities are unchanged. Emphysematous changes again noted within the upper lungs. No new lung findings. No pleural effusion or pneumothorax is seen. IMPRESSION: 1. Stable chest x-ray. Patchy bibasilar opacities are unchanged, most likely chronic atelectasis/scarring. 2. Tubes and lines remain well positioned. Electronically Signed   By: Franki Cabot M.D.   On: 08/17/2019 07:26    ASSESSMENT AND PLAN    -Multidisciplinary rounds held today  Acute Hypoxic Respiratory Failure with severe ARDS -MRSA community-acquired pneumonia -Continue vancomycin-appreciate Pharm.D.  -Stop nebulizer therapy due to SVT -Currently with severe ARDS PaO2/FiO2 ratio <100 -Ventilator management-optimized lung physiology currently on pressure control mode with 10 of PEEP to minimize driving pressure and allow for acceptable oxygenation -Considering paralysis via vecuronium if necessary -Borderline hypotensive -Stopping fentanyl due to respiratory muscle stiffness hindering lung mechanics, will initiate ketamine to potentiate bronchodilation and adjuvant effect to increase BP -Background of advanced bullous emphysema   Chronic heart failure with preserved EF - will attempt to diurese 1 able based on vitals and fluid balance, currently net 1 L negative -ICU monitoring     Sepsis -Due to MRSA pneumonia -Continue vancomycin -Consider vasopressors if MAP less than 60 -Starting  Solu-Medrol 40 twice daily   ID -continue IV abx as prescibed -follow up cultures  GI/Nutrition GI PROPHYLAXIS as indicated DIET-->TF's as tolerated Constipation protocol as indicated  ENDO - ICU hypoglycemic\Hyperglycemia protocol -check FSBS per protocol   ELECTROLYTES -follow labs as needed -replace as needed -pharmacy consultation   DVT/GI PRX ordered -SCDs  TRANSFUSIONS AS NEEDED MONITOR FSBS ASSESS the need for LABS as needed   Critical care provider statement:    Critical care time (minutes):  32   Critical care time was exclusive of:  Separately billable procedures and treating other patients   Critical care was necessary to treat or prevent imminent or life-threatening deterioration of the following conditions:   Sepsis, MRSA pneumonia, acute hypoxemic respiratory failure, severe ARDS, multiple comorbid conditions   Critical care was time spent personally by me on the following activities:  Development of treatment plan with patient or surrogate, discussions with consultants, evaluation of patient's response to treatment, examination of patient, obtaining history from patient or surrogate, ordering and performing treatments and interventions, ordering and review of laboratory studies and re-evaluation of patient's condition.  I assumed direction of critical care for this patient from another provider in my specialty: no    This document was prepared using Dragon voice recognition software and may include unintentional dictation errors.    Ottie Glazier, M.D.  Division of [facility]

## 2019-08-18 NOTE — Progress Notes (Addendum)
Shift summary:  - Sedation decreased and pressors off this AM. Will CTM.   - Rhythm change this AM: ST in 160s. Dr. Lanney Gins notified. Order received and completed for 0.5 mg IV Digoxin X 1. Will CTM.

## 2019-08-18 NOTE — Progress Notes (Signed)
Rt assisted Dr. Lanney Gins with bedside bronchoscopy at this time.

## 2019-08-18 NOTE — Procedures (Signed)
FLEXIBLE BRONCHOSCOPY PROCEDURE NOTE    Flexible bronchoscopy was performed on 08/18/19 by :Lanney Gins  assistance by : 1)RT Shanon and 2)RT Fritz Pickerel 3) RN Aleene Davidson   Indication for the procedure was :  Patient is intubated with severe ARDS in medical intensive care unit.  During bath the patient had forceful cough with high-volume bright red blood per ET tube and significant desaturation despite high settings on the ventilator on 100% FiO2.  Emergency flexible bronchoscopy was performed.   Informed Consent: Emergent    Procedure Findings:  Bronchoscope was inserted via ETT  without difficulty.  Posterior oropharynx, epiglottis, arytenoids, false cords and vocal cords were not visualized as these were bypassed by endotracheal tube. The distal trachea was normal in circumference and appearance without mucosal, cartilaginous or branching abnormalities.  The main carina was mildly splayed. Copious amounts of blood was suctioned out which was bright red in color.  Ice cold lavage was performed with notable decrement in bleeding and immediate aspiration of instilled ice cold saline.  The left side of the lung appeared to be more clear after initial lavage.  Right side of the lung appeared to have bleeding post initial lavage with ice cold saline. All right and left lobar airways were visualized to the Subsegmental level.  Sub- sub segmental carinae were identified in all the distal airways.   Secretions were visible in the following airways and appeared to be bloody.  The mucosa was mildly friable worse on the right.After second ice cold saline lavage on the right lung bleeding had decreased further with enough clearance of visual field to delineate etiology of bleeding which was at right middle lobe.  Right middle lobe was then treated with 5 mL of TXA with 10 mL of ice cold saline, post 30 seconds this was evacuated with bronchoscopic suctioning.  Post treatment visual inspection showed no additional  bleeding.  Clearance of residual clot material was performed and scope was retracted via endotracheal tube without resistance.   Claudette Stapler MD  [place] Division of Pulmonary & Critical Care Medicine

## 2019-08-18 NOTE — Progress Notes (Deleted)
Rt assisted Dr. Laural Roes with bedside bronchoscopy at this time.

## 2019-08-18 NOTE — Patient Outreach (Signed)
[facility]) Care Management [facility] CM Case Closure note- patient hospitalized > 10 days 08/18/2019  JOSHWA HEMRIC Jun 05, 1956 [id number]  [place] Telephone Outreach Case Closure note re:  [name], 63 y/o male referred to [place] by [facility] Pharmacist embedded in patient's PCP office for assessment of community resource needs and ongoing reinforcement of self- health management of chronic disease state of COPD/ CHF. Patient has had no recent inpatient hospital admissions. Patient has history including, but not limited to, chronic respiratory failure; CHF; COPD on home O2 with ongoing tobacco use; pulmonary HTN; and anxiety  Since [facility] CM initially became involved in patient's care, he had recent hospitalization October 19-22, 2020 for COPD exacerbation/ acute on chronic respiratory failure  Unfortunately, patient experienced hospital re-admission August 05, 2019 at [facility] (26 days post-last hospital discharge) with shortness of breath and in acute respiratory failure; patient was admitted to ICU and emergently intubated.  To date, patient remains in ICU intubated.  Secure communication via EMR sent to [place]  and [place], notifying of patient's current hospital admission > 10 LOS and subsequent [facility] CM case closure  Plan:  Will close [place] case as he has been hospitalized > 10 days  Oneta Rack, RN, BSN, [employer] Coordinator [facility] Care Management  [phone]

## 2019-08-19 ENCOUNTER — Other Ambulatory Visit: Payer: Self-pay | Admitting: Nurse Practitioner

## 2019-08-19 LAB — GLUCOSE, CAPILLARY
Glucose-Capillary: 121 mg/dL — ABNORMAL HIGH (ref 70–99)
Glucose-Capillary: 122 mg/dL — ABNORMAL HIGH (ref 70–99)
Glucose-Capillary: 129 mg/dL — ABNORMAL HIGH (ref 70–99)
Glucose-Capillary: 133 mg/dL — ABNORMAL HIGH (ref 70–99)
Glucose-Capillary: 140 mg/dL — ABNORMAL HIGH (ref 70–99)
Glucose-Capillary: 165 mg/dL — ABNORMAL HIGH (ref 70–99)

## 2019-08-19 LAB — CBC
HCT: 32.2 % — ABNORMAL LOW (ref 39.0–52.0)
Hemoglobin: 9.8 g/dL — ABNORMAL LOW (ref 13.0–17.0)
MCH: 31.8 pg (ref 26.0–34.0)
MCHC: 30.4 g/dL (ref 30.0–36.0)
MCV: 104.5 fL — ABNORMAL HIGH (ref 80.0–100.0)
Platelets: 138 10*3/uL — ABNORMAL LOW (ref 150–400)
RBC: 3.08 MIL/uL — ABNORMAL LOW (ref 4.22–5.81)
RDW: 13 % (ref 11.5–15.5)
WBC: 12.4 10*3/uL — ABNORMAL HIGH (ref 4.0–10.5)
nRBC: 0 % (ref 0.0–0.2)

## 2019-08-19 LAB — BASIC METABOLIC PANEL
Anion gap: 8 (ref 5–15)
BUN: 48 mg/dL — ABNORMAL HIGH (ref 8–23)
CO2: 37 mmol/L — ABNORMAL HIGH (ref 22–32)
Calcium: 8.6 mg/dL — ABNORMAL LOW (ref 8.9–10.3)
Chloride: 94 mmol/L — ABNORMAL LOW (ref 98–111)
Creatinine, Ser: 0.58 mg/dL — ABNORMAL LOW (ref 0.61–1.24)
GFR calc Af Amer: >60 mL/min (ref >60)
GFR calc non Af Amer: >60 mL/min (ref >60)
Glucose, Bld: 162 mg/dL — ABNORMAL HIGH (ref 70–99)
Potassium: 4.3 mmol/L (ref 3.5–5.1)
Sodium: 139 mmol/L (ref 135–145)

## 2019-08-19 LAB — VANCOMYCIN, TROUGH: Vancomycin Tr: 17 ug/mL (ref 15–20)

## 2019-08-19 LAB — VANCOMYCIN, PEAK: Vancomycin Pk: 27 ug/mL — ABNORMAL LOW (ref 30–40)

## 2019-08-19 MED ORDER — VANCOMYCIN HCL IN DEXTROSE 1-5 GM/200ML-% IV SOLN
1000.0000 mg | Freq: Two times a day (BID) | INTRAVENOUS | Status: DC
  Administered 2019-08-20: 1000 mg via INTRAVENOUS
  Filled 2019-08-19 (×2): qty 200

## 2019-08-19 MED ORDER — NOREPINEPHRINE 4 MG/250ML-% IV SOLN
0.0000 ug/min | INTRAVENOUS | Status: DC
  Filled 2019-08-19: qty 250

## 2019-08-19 MED ORDER — SODIUM CHLORIDE 0.9 % IV SOLN
0.0000 ug/min | INTRAVENOUS | Status: DC
  Administered 2019-08-20: 2 ug/min via INTRAVENOUS
  Filled 2019-08-19 (×2): qty 4

## 2019-08-19 MED ORDER — HYDROMORPHONE HCL 1 MG/ML IJ SOLN
0.5000 mg | INTRAMUSCULAR | Status: DC | PRN
  Administered 2019-08-19 – 2019-08-20 (×6): 1 mg via INTRAVENOUS
  Filled 2019-08-19 (×6): qty 1

## 2019-08-19 MED ORDER — FUROSEMIDE 10 MG/ML IJ SOLN
20.0000 mg | Freq: Once | INTRAMUSCULAR | Status: AC
  Administered 2019-08-19: 20 mg via INTRAVENOUS
  Filled 2019-08-19: qty 2

## 2019-08-19 MED ORDER — MIDODRINE HCL 5 MG PO TABS
5.0000 mg | ORAL_TABLET | Freq: Three times a day (TID) | ORAL | Status: DC
  Administered 2019-08-19 – 2019-08-20 (×3): 5 mg
  Filled 2019-08-19 (×3): qty 1

## 2019-08-19 MED ORDER — NOREPINEPHRINE BITARTRATE 1 MG/ML IV SOLN
0.0000 ug/min | INTRAVENOUS | Status: DC
  Filled 2019-08-19: qty 4

## 2019-08-19 NOTE — Progress Notes (Signed)
Call received by family member noting a concern that the patient's daughter was in the room and had questions which the family member could not answer. Arrived to patient's room with care nurse, introduced self and began to listen to daughter's concern regarding the ventilator, disconnection and alarms associated with same.   Began conversation explaining purpose of ventilator, how the patient was being monitored by the Philips monitor, the ventilator and IV pumps. Explained that with a high flow ventilation that at times, the tubing may become dislodged from the ventilator while the endotracheal tube remained secured.  All monitors explained to daughter and provided ample time to allow daughter to ask questions with provided answers.   Provided small note board for daughter to assist with conversations with father. Daughter appeared appreciative for time spent with her and assistance provided.  Also encouraged daughter to take regular breaks and fluids while visiting father.  Follow up call with family member [name] [phone]) and explained the aforementioned to her. All questions were answered and family member appeared appreciative of the follow up as well.   No additional concerns or questions at this time.

## 2019-08-19 NOTE — Progress Notes (Signed)
Pharmacy Antibiotic Note  Dustin [name] is a 63 y.o. male admitted on 08/09/2019. Patient admitted to ICU requiring mechanical ventilation. Patient started on Zosyn on 11/27 and vancomycin started on 11/28. 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 Patient with MRSA in tracheal aspirate Pharmacy has been consulted for Vancomycin dosing.  Plan: Transition vancomycin to 1000mg  IV Q12hr.   Height: 5\' 10"  (177.8 cm) Weight: 237 lb 3.4 oz (107.6 kg) IBW/kg (Calculated) : 73  Temp (24hrs), Avg:99.1 F (37.3 C), Min:97.3 F (36.3 C), Max:100.4 F (38 C)  Recent Labs  Lab 08/15/19 0515 08/16/19 0456 08/16/19 0500 08/17/19 0500 08/17/19 2103 08/18/19 0544 08/18/19 1730 08/19/19 0545 08/19/19 1002 08/19/19 1658  WBC 12.0*  --  19.0* 14.2*  --   --  16.9* 12.4*  --   --   CREATININE 0.53* 0.71  --  0.55* 0.70 0.65  --  0.58*  --   --   VANCOTROUGH  --   --   --   --   --   --   --   --   --  17  VANCOPEAK  --   --   --   --   --   --   --   --  27*  --     Estimated Creatinine Clearance: 113 mL/min (A) (by C-G formula based on SCr of 0.58 mg/dL (L)).    Allergies  Allergen Reactions  . Asa [Aspirin] Other (See Comments)    Reaction: swelling of the right side.  . Bee Venom Swelling  . Codeine Other (See Comments)    Reaction: Difficulty breathing  . Ibuprofen Other (See Comments)    Reaction: Swelling of the right side.  . Iodinated Diagnostic Agents Other (See Comments)    Reaction:  Unknown  Other reaction(s): Unknown    Antimicrobials this admission: Zosyn 11/27 >> 11/30  Vancomycin 11/28 >>   Dose adjustments this admission: 12/1 Vancomycin transitioned to 1000mg  Q12hr.   Microbiology results: 11/17 BCx: no growth  11/17 MRSA PCR: positive  11/17 SARS Coronavirus: negative  11/27 tracheal aspirate: abundant MRSA.   Thank you for allowing pharmacy to be a part of this patient's  care.  [name],[name] 08/19/2019 8:08 PM

## 2019-08-19 NOTE — Progress Notes (Signed)
CRITICAL CARE PROGRESS NOTE    Name: ENOC GETTER MRN: [id number] DOB: 20-May-1956     LOS: 60   SUBJECTIVE FINDINGS & SIGNIFICANT EVENTS   Patient description:  Dustin [name] is an 63 y.o. male smoker PTA, withhistory of COPD andchronic respiratory failure, chronic diastolic CHF, history of DVT on apixaban presented to the ER with complaints of having fever chills and fatigue.Patient's daughter went to check on him and found he was febrile and was brought to the ER. Patient admitted with acute on chronic hypoxic and hypercarbic respiratory failure failing BiPAP and ultimately being intubated and mechanically ventilated.    Lines / Drains: PIVx3  Cultures / Sepsis markers: Tracheal aspirate with MRSA  Antibiotics: vancomycin   Protocols / Consultants: Palliative team for [place]  Tests / Events: CXR with bilateral consolidated pneumonia  Overnight: Remains critically ill on MV    Events: 11/17: Intubated. Started on neo 11/18:PICC line placed.Remains intubated, FiO2 50%. On neo at 40 mcg. Sedated on 15 mcg propofol and 200 mcg fentanyl. Responds to verbal stimuli and follows commands. 11/19: Remains intubated and sedated. FiO2 55%. On neo at 22, vasopressin .03. Sedated with 25 propofol and 300 fentanyl. Unresponsive to verbal stimuli. 11/20: Pt confused and delirious when sedated weaned.Remains intubated and sedated - FiO2 50%, 3 mcg neo, .03 units vasopressin. Sedated on 20 mcg propofol, 300 mcg fentanyl.  11/21remains on vent, failed SAT/SBT 11/22 remains vent fio2 80%, all family members updated 11/23 remains on vent, pressors, severe hypoxia,family updated 11/24 unable to wean severe hypoxia 11/26 unable to wean due to persistent severe hypoxia, updated brother and sister 11/27  thick purulent secretions, fever, increase FiO2 to 90%, antibiotics were started: Zosyn 11/28 Vancomycin added +GPC '@sputum'  11/29 Sputum growing staph aureus, susceptibilities pending, FiO2 had to be increased again 11/30-ventilator management, placed on pressure control with PEEP of 10 to optimize oxygenation and decrease auto PEEP secondary to dynamic hyperinflation 12/1- weaned down to 90%, HR stabalized post amiodarone/precedex infusion, patient is awake following commands, his pain is adequately controlled with dilaudid now previously was on fentanyl gtt. Has put out 750cc urine with low dose diuresis and is off vasopressors.   11/  PAST MEDICAL HISTORY   Past Medical History:  Diagnosis Date   Allergy    Anxiety    Asthma    Chronic diastolic CHF (congestive heart failure) ([place])    a. echo 07/2013: EF 60-65%, DD, biatrial dilatation, Ao sclerosis, dilated RV, moderate pulmonary HTN, elevated CV and RA pressures; b. patient reported echo at Dr. Laurelyn Sickle office 02/2015 - his office does not have record of him being a pt there c. echo 11/2015: EF 60-65%, Grade 1 DD, mod-severe pulm pressures   Chronic respiratory failure (HCC)    a. on 2L via nasal cannula; b. secondary to COPD   COPD (chronic obstructive pulmonary disease) ([place])    on 2L continuous    Depression    Emphysema of lung (HCC)    GERD (gastroesophageal reflux disease)    HCAP (healthcare-associated pneumonia)    01/22/18-01/28/18    Headache    Hypertension    Leg DVT (deep venous thromboembolism), acute, bilateral ([place])    02/26/18   Personal history of tobacco use, presenting hazards to health 08/17/2015   Pulmonary embolism ([place])    02/26/18   Pulmonary HTN ([place])    Tobacco abuse      SURGICAL HISTORY   Past Surgical History:  Procedure Laterality Date  ABDOMINAL SURGERY     ADENOIDECTOMY     CARPAL TUNNEL RELEASE Right    corpal tunnel Right    ELBOW SURGERY Right    repaired tendon     hemmorhoid N/A    HEMORRHOID SURGERY     NASAL SINUS SURGERY     NASAL SINUS SURGERY     1970s   NOSE SURGERY     reflux surgery     1994   ROTATOR CUFF REPAIR Right    SHOULDER SURGERY     TENNIS ELBOW RELEASE/NIRSCHEL PROCEDURE Right    TONSILLECTOMY     URETHRA SURGERY     surgery 6 times from age [age] yrs old   URETHRA SURGERY       FAMILY HISTORY   Family History  Problem Relation Age of Onset   CAD Father    Hyperlipidemia Father    Stroke Father    Heart disease Father    Arthritis Father    Hearing loss Father    Hypertension Father    Hypertension Mother    Peripheral Artery Disease Mother    Rheum arthritis Mother    Asthma Mother    Bipolar disorder Mother    Depression Mother    Malignant hypertension Mother    Arthritis Mother    Hearing loss Mother    Heart disease Mother    Hyperlipidemia Mother    Kidney disease Mother    Birth defects Brother    Heart disease Brother    Alcohol abuse Daughter    Arthritis Daughter    Asthma Daughter    Depression Daughter    Drug abuse Daughter    Miscarriages / [place] Daughter    Intellectual disability Daughter    Kidney disease Son    Birth defects Paternal Grandmother    Depression Paternal Grandmother    Heart disease Paternal Grandmother    Birth defects Sister    Diabetes Neg Hx      SOCIAL HISTORY   Social History   Tobacco Use   Smoking status: Current Every Day Smoker    Packs/day: 0.10    Years: 40.00    Pack years: 4.00    Types: Cigarettes    Last attempt to quit: 01/14/2018    Years since quitting: 1.5   Smokeless tobacco: Never Used  Substance Use Topics   Alcohol use: No   Drug use: No     MEDICATIONS   Current Medication:  Current Facility-Administered Medications:    0.9 %  sodium chloride infusion, 250 mL, Intravenous, Continuous, Kasa, Kurian, MD, Last Rate: 10 mL/hr at 08/18/19 1345, 250 mL at 08/18/19  1345   acetaminophen (TYLENOL) tablet 650 mg, 650 mg, Oral, Q6H PRN, 650 mg at 08/18/19 1349 **OR** acetaminophen (TYLENOL) suppository 650 mg, 650 mg, Rectal, Q6H PRN, Tukov-Yual, Magdalene S, NP   amiodarone (NEXTERONE PREMIX) 360-4.14 MG/200ML-% (1.8 mg/mL) IV infusion, 60 mg/hr, Intravenous, Continuous, Blakeney, Dana G, NP, Last Rate: 33.3 mL/hr at 08/19/19 0600, 60 mg/hr at 08/19/19 0600   budesonide (PULMICORT) nebulizer solution 0.5 mg, 0.5 mg, Nebulization, BID, Kasa, Kurian, MD, 0.5 mg at 08/19/19 0802   chlorhexidine gluconate (MEDLINE KIT) (PERIDEX) 0.12 % solution 15 mL, 15 mL, Mouth Rinse, BID, Kasa, Kurian, MD, 15 mL at 08/18/19 2034   Chlorhexidine Gluconate Cloth 2 % PADS 6 each, 6 each, Topical, Daily, Kasa, Kurian, MD, 6 each at 08/18/19 1530   cholecalciferol (VITAMIN D3) tablet 2,000 Units, 2,000 Units, Per Tube, Daily, Kasa,  Maretta Bees, MD, 2,000 Units at 08/18/19 1053   dexmedetomidine (PRECEDEX) 400 MCG/100ML (4 mcg/mL) infusion, 0.4-1.2 mcg/kg/hr, Intravenous, Titrated, Awilda Bill, NP, Last Rate: 32.3 mL/hr at 08/19/19 0818, 1.2 mcg/kg/hr at 08/19/19 0818   escitalopram (LEXAPRO) tablet 20 mg, 20 mg, Per Tube, QHS, Flora Lipps, MD, 20 mg at 08/18/19 2213   famotidine (PEPCID) tablet 20 mg, 20 mg, Per Tube, BID, Flora Lipps, MD, 20 mg at 08/18/19 2213   feeding supplement (PRO-STAT SUGAR FREE 64) liquid 60 mL, 60 mL, Per Tube, QID, Flora Lipps, MD, 60 mL at 08/18/19 2228   feeding supplement (VITAL HIGH PROTEIN) liquid 1,000 mL, 1,000 mL, Per Tube, Q24H, Flora Lipps, MD, 1,000 mL at 08/18/19 1504   HYDROmorphone (DILAUDID) injection 0.5-1 mg, 0.5-1 mg, Intravenous, Q4H PRN, Awilda Bill, NP, 1 mg at 08/19/19 0845   insulin aspart (novoLOG) injection 0-9 Units, 0-9 Units, Subcutaneous, Q4H, Flora Lipps, MD, 1 Units at 08/19/19 0845   ipratropium-albuterol (DUONEB) 0.5-2.5 (3) MG/3ML nebulizer solution 3 mL, 3 mL, Nebulization, Q4H, Kasa, Kurian, MD, 3 mL  at 08/19/19 0802   ketamine (KETALAR) 500 mg in sodium chloride 0.9 % 100 mL (5 mg/mL) infusion, 0.5 mg/kg/hr, Intravenous, Continuous, Ottie Glazier, MD, Last Rate: 10.76 mL/hr at 08/19/19 0635, 0.5 mg/kg/hr at 08/19/19 0635   lactulose (CHRONULAC) 10 GM/15ML solution 10 g, 10 g, Per Tube, TID, Tyler Pita, MD, 10 g at 08/18/19 2213   MEDLINE mouth rinse, 15 mL, Mouth Rinse, 10 times per day, Flora Lipps, MD, 15 mL at 08/19/19 0544   methylPREDNISolone sodium succinate (SOLU-MEDROL) 40 mg/mL injection 40 mg, 40 mg, Intravenous, BID, Mortimer Fries, Kurian, MD, 40 mg at 08/18/19 2213   metoprolol tartrate (LOPRESSOR) injection 2.5 mg, 2.5 mg, Intravenous, Q6H PRN, Awilda Bill, NP, 2.5 mg at 08/18/19 2027   multivitamin liquid 15 mL, 15 mL, Per Tube, Daily, Flora Lipps, MD, 15 mL at 08/18/19 1052   mupirocin ointment (BACTROBAN) 2 %, , Nasal, BID, Mortimer Fries, Kurian, MD   norepinephrine (LEVOPHED) 4 mg in dextrose 5 % 250 mL (0.016 mg/mL) infusion, 0-40 mcg/min, Intravenous, Titrated, Ottie Glazier, MD, Stopped at 08/18/19 0834   polyethylene glycol (MIRALAX / GLYCOLAX) packet 17 g, 17 g, Per Tube, Daily, Charlett Nose, RPH, 17 g at 08/18/19 1051   polyvinyl alcohol (LIQUIFILM TEARS) 1.4 % ophthalmic solution 1 drop, 1 drop, Both Eyes, PRN, Tukov-Yual, Magdalene S, NP, 1 drop at 08/17/19 0931   senna-docusate (Senokot-S) tablet 1 tablet, 1 tablet, Per Tube, BID, Charlett Nose, RPH, 1 tablet at 08/18/19 2213   sodium chloride flush (NS) 0.9 % injection 10-40 mL, 10-40 mL, Intracatheter, Q12H, Kasa, Maretta Bees, MD, 10 mL at 08/17/19 2302   sodium chloride flush (NS) 0.9 % injection 10-40 mL, 10-40 mL, Intracatheter, PRN, Flora Lipps, MD, 10 mL at 08/06/19 2112   vancomycin (VANCOCIN) IVPB 750 mg/150 ml premix, 750 mg, Intravenous, Q8H, Hall, Scott A, RPH, Last Rate: 150 mL/hr at 08/19/19 0802, 750 mg at 08/19/19 0802    ALLERGIES   Asa [aspirin], Bee venom, Codeine, Ibuprofen,  and Iodinated diagnostic agents    REVIEW OF SYSTEMS     Unable to obtain due to critically ill status on mechanical ventilation  PHYSICAL EXAMINATION   Vital Signs: Temp:  [97.9 F (36.6 C)-101.5 F (38.6 C)] 97.9 F (36.6 C) (12/01 0800) Pulse Rate:  [71-161] 86 (12/01 1000) Resp:  [17-30] 21 (12/01 1000) BP: (66-129)/(37-96) 98/61 (12/01 1000) SpO2:  [87 %-97 %] 92 % (  12/01 1000) FiO2 (%):  [90 %-100 %] 90 % (12/01 0802)  GENERAL: Sedated on mechanical ventilation HEAD: Normocephalic, atraumatic.  EYES: Pupils equal, round, reactive to light.  No scleral icterus.  MOUTH: Moist mucosal membrane. NECK: Supple. No thyromegaly. No nodules. No JVD.  PULMONARY: Rhonchorous breath sounds bilaterally with mechanical ventilation in the background CARDIOVASCULAR: S1 and S2. Regular rate and rhythm. No murmurs, rubs, or gallops.  GASTROINTESTINAL: Soft, nontender, non-distended. No masses. Positive bowel sounds. No hepatosplenomegaly.  MUSCULOSKELETAL: No swelling, clubbing, or edema.  NEUROLOGIC: GCS 4 T SKIN:intact,warm,dry   PERTINENT DATA     Infusions:  sodium chloride 250 mL (08/18/19 1345)   amiodarone 60 mg/hr (08/19/19 0600)   dexmedetomidine (PRECEDEX) IV infusion 1.2 mcg/kg/hr (08/19/19 0818)   ketamine (KETALAR) Adult IV Infusion 0.5 mg/kg/hr (08/19/19 6283)   norepinephrine (LEVOPHED) Adult infusion Stopped (08/18/19 0834)   vancomycin 750 mg (08/19/19 0802)   Scheduled Medications:  budesonide (PULMICORT) nebulizer solution  0.5 mg Nebulization BID   chlorhexidine gluconate (MEDLINE KIT)  15 mL Mouth Rinse BID   Chlorhexidine Gluconate Cloth  6 each Topical Daily   cholecalciferol  2,000 Units Per Tube Daily   escitalopram  20 mg Per Tube QHS   famotidine  20 mg Per Tube BID   feeding supplement (PRO-STAT SUGAR FREE 64)  60 mL Per Tube QID   feeding supplement (VITAL HIGH PROTEIN)  1,000 mL Per Tube Q24H   insulin aspart  0-9 Units  Subcutaneous Q4H   ipratropium-albuterol  3 mL Nebulization Q4H   lactulose  10 g Per Tube TID   mouth rinse  15 mL Mouth Rinse 10 times per day   methylPREDNISolone (SOLU-MEDROL) injection  40 mg Intravenous BID   multivitamin  15 mL Per Tube Daily   mupirocin ointment   Nasal BID   polyethylene glycol  17 g Per Tube Daily   senna-docusate  1 tablet Per Tube BID   sodium chloride flush  10-40 mL Intracatheter Q12H   PRN Medications: acetaminophen **OR** acetaminophen, HYDROmorphone (DILAUDID) injection, metoprolol tartrate, polyvinyl alcohol, sodium chloride flush Hemodynamic parameters:   Intake/Output: 11/30 0701 - 12/01 0700 In: 2401.9 [I.V.:1121.3; NG/GT:799.9; IV Piggyback:480.7] Out: 1517 [Urine:1485]  Ventilator  Settings: Vent Mode: PCV FiO2 (%):  [90 %-100 %] 90 % Set Rate:  [16 bmp-18 bmp] 18 bmp Vt Set:  [500 mL] 500 mL PEEP:  [10 cmH20] 10 cmH20   LAB RESULTS:  Basic Metabolic Panel: Recent Labs  Lab 08/14/19 0537 08/15/19 0515 08/16/19 0456 08/17/19 0500 08/17/19 2103 08/18/19 0544 08/19/19 0545  NA  --  139 139 139 140 141 139  K  --  4.3 4.5 3.7 4.0 4.3 4.3  CL  --  95* 95* 95* 93* 96* 94*  CO2  --  35* 36* 36* 35* 36* 37*  GLUCOSE  --  142* 133* 148* 123* 149* 162*  BUN  --  42* 42* 37* 46* 46* 48*  CREATININE  --  0.53* 0.71 0.55* 0.70 0.65 0.58*  CALCIUM  --  8.7* 8.8* 8.2* 8.7* 8.5* 8.6*  MG 1.8 2.4  --  1.8 2.3 2.2  --   PHOS 2.9  --  3.6  --  4.4  --   --    Liver Function Tests: Recent Labs  Lab 08/15/19 0504 08/16/19 0456  ALBUMIN 2.7* 2.9*   No results for input(s): LIPASE, AMYLASE in the last 168 hours. No results for input(s): AMMONIA in the last 168 hours. CBC: Recent  Labs  Lab 08/13/19 0431 08/14/19 0537 08/15/19 0515 08/16/19 0500 08/17/19 0500 08/18/19 1730 08/19/19 0545  WBC 11.4* 14.2* 12.0* 19.0* 14.2* 16.9* 12.4*  NEUTROABS 9.4* 11.9*  --  17.3*  --   --   --   HGB 10.8* 11.0* 10.8* 11.4* 10.2* 10.8*  9.8*  HCT 34.6* 35.4* 34.9* 36.6* 31.3* 35.2* 32.2*  MCV 102.7* 102.9* 102.0* 102.8* 98.4 104.8* 104.5*  PLT 158 175 150 159 139* 170 138*   Cardiac Enzymes: No results for input(s): CKTOTAL, CKMB, CKMBINDEX, TROPONINI in the last 168 hours. BNP: Invalid input(s): POCBNP CBG: Recent Labs  Lab 08/18/19 1639 08/18/19 2037 08/19/19 0038 08/19/19 0451 08/19/19 0800  GLUCAP 157* 139* 165* 129* 140*     IMAGING RESULTS:  Imaging: Dg Chest Port 1 View  Result Date: 08/18/2019 CLINICAL DATA:  Respiratory failure. EXAM: PORTABLE CHEST 1 VIEW COMPARISON:  Chest x-ray 08/17/2019, 08/16/2019, 08/09/2019. CT 06/24/2019. FINDINGS: Endotracheal tube, NG tube, right PICC line stable position. Heart size stable. COPD. Progressive bibasilar pulmonary infiltrates/edema. Small bilateral pleural effusions noted on today's exam. No pneumothorax. IMPRESSION: 1.  Lines and tubes in stable position. 2. Progressive bibasilar pulmonary infiltrates/edema. Small bilateral pleural effusion noted on today's exam. 3.  COPD Electronically Signed   By: Marcello Moores  Register   On: 08/18/2019 06:11    ASSESSMENT AND PLAN    -Multidisciplinary rounds held today  Acute Hypoxic Respiratory Failure with severe ARDS -MRSA community-acquired pneumonia -Continue vancomycin-appreciate Pharm.D.  -Stop nebulizer therapy due to SVT -Currently with severe ARDS PaO2/FiO2 ratio <100 -Ventilator management-optimized lung physiology currently on pressure control mode with 10 of PEEP to minimize driving pressure and allow for acceptable oxygenation -Considering paralysis via vecuronium if necessary -Borderline hypotensive -Stopping fentanyl due to respiratory muscle stiffness hindering lung mechanics, will initiate ketamine to potentiate bronchodilation and adjuvant effect to increase BP -Background of advanced bullous emphysema   Chronic heart failure with preserved EF - diresed with lasix today post improved hemodynamics   -ICU monitoring     Sepsis -Due to MRSA pneumonia -Continue vancomycin -Consider vasopressors if MAP less than 60 -Starting Solu-Medrol 40 twice daily   ID -continue IV abx as prescibed -follow up cultures  GI/Nutrition GI PROPHYLAXIS as indicated DIET-->TF's as tolerated Constipation protocol as indicated  ENDO - ICU hypoglycemic\Hyperglycemia protocol -check FSBS per protocol   ELECTROLYTES -follow labs as needed -replace as needed -pharmacy consultation   DVT/GI PRX ordered -SCDs  TRANSFUSIONS AS NEEDED MONITOR FSBS ASSESS the need for LABS as needed   Critical care provider statement:    Critical care time (minutes):  32   Critical care time was exclusive of:  Separately billable procedures and treating other patients   Critical care was necessary to treat or prevent imminent or life-threatening deterioration of the following conditions:   Sepsis, MRSA pneumonia, acute hypoxemic respiratory failure, severe ARDS, multiple comorbid conditions   Critical care was time spent personally by me on the following activities:  Development of treatment plan with patient or surrogate, discussions with consultants, evaluation of patient's response to treatment, examination of patient, obtaining history from patient or surrogate, ordering and performing treatments and interventions, ordering and review of laboratory studies and re-evaluation of patient's condition.  I assumed direction of critical care for this patient from another provider in my specialty: no    This document was prepared using Dragon voice recognition software and may include unintentional dictation errors.    Ottie Glazier, M.D.  Division of Pulmonary & Critical Care  [place]

## 2019-08-19 NOTE — Progress Notes (Signed)
Pharmacy Electrolyte Monitoring Consult:  Dustin [name]. Dustin [name] is a 68 YOM admitted to the ICU on 08/15/2019 with respiratory failure requiring intubation from COPD, CHF, and possible PNA. 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  Patient is on tube feeds (VITAL HIGH PROTEIN 1,000 mL at 20 mL/hr and PRO-STAT 60 mL QID).   Labs:  Sodium (mmol/L)  Date Value  08/19/2019 139  04/01/2019 145 (H)  07/23/2014 139   Potassium (mmol/L)  Date Value  08/19/2019 4.3  07/23/2014 4.1   Magnesium (mg/dL)  Date Value  08/18/2019 2.2   Phosphorus (mg/dL)  Date Value  08/17/2019 4.4   Calcium (mg/dL)  Date Value  08/19/2019 8.6 (L)   Calcium, Total (mg/dL)  Date Value  07/23/2014 8.4 (L)   Albumin (g/dL)  Date Value  08/16/2019 2.9 (L)  07/23/2014 2.8 (L)    Assessment/Plan:  Electrolytes:  - No further replacement warranted - BMP with am labs on 12/1.   Goals of Therapy: - Potassium ~ 4 - Magnesium ~ 2  - All other electrolytes within normal limits  Glucose:  - Patient is on 0-9 units q4h SSI. - Patient requiring 5 units over past 24 hours  - Will continue to monitor and consult with diabetes team as necessary.  Constipation:  - Last documented bowel movement 12/1  - Senna/docusate 2 tab BID on 11/28, Lactulose 10g VT TID x 6 doses.   Pharmacy will continue to monitor and adjust per consult.   Dakoda Bassette L, RPh 08/19/2019 8:07 PM

## 2019-08-19 DEATH — deceased

## 2019-08-20 ENCOUNTER — Inpatient Hospital Stay: Payer: Medicare Other

## 2019-08-20 DIAGNOSIS — Z66 Do not resuscitate: Secondary | ICD-10-CM

## 2019-08-20 DIAGNOSIS — Z7189 Other specified counseling: Secondary | ICD-10-CM

## 2019-08-20 DIAGNOSIS — Z515 Encounter for palliative care: Secondary | ICD-10-CM

## 2019-08-20 LAB — CBC WITH DIFFERENTIAL/PLATELET
Abs Immature Granulocytes: 0.04 10*3/uL (ref 0.00–0.07)
Basophils Absolute: 0 10*3/uL (ref 0.0–0.1)
Basophils Relative: 0 %
Eosinophils Absolute: 0 10*3/uL (ref 0.0–0.5)
Eosinophils Relative: 0 %
HCT: 31.9 % — ABNORMAL LOW (ref 39.0–52.0)
Hemoglobin: 9.9 g/dL — ABNORMAL LOW (ref 13.0–17.0)
Immature Granulocytes: 0 %
Lymphocytes Relative: 3 %
Lymphs Abs: 0.3 10*3/uL — ABNORMAL LOW (ref 0.7–4.0)
MCH: 32 pg (ref 26.0–34.0)
MCHC: 31 g/dL (ref 30.0–36.0)
MCV: 103.2 fL — ABNORMAL HIGH (ref 80.0–100.0)
Monocytes Absolute: 0.4 10*3/uL (ref 0.1–1.0)
Monocytes Relative: 4 %
Neutro Abs: 9.9 10*3/uL — ABNORMAL HIGH (ref 1.7–7.7)
Neutrophils Relative %: 93 %
Platelets: 132 10*3/uL — ABNORMAL LOW (ref 150–400)
RBC: 3.09 MIL/uL — ABNORMAL LOW (ref 4.22–5.81)
RDW: 13.1 % (ref 11.5–15.5)
WBC: 10.6 10*3/uL — ABNORMAL HIGH (ref 4.0–10.5)
nRBC: 0 % (ref 0.0–0.2)

## 2019-08-20 LAB — BASIC METABOLIC PANEL
Anion gap: 7 (ref 5–15)
BUN: 42 mg/dL — ABNORMAL HIGH (ref 8–23)
CO2: 39 mmol/L — ABNORMAL HIGH (ref 22–32)
Calcium: 8.5 mg/dL — ABNORMAL LOW (ref 8.9–10.3)
Chloride: 94 mmol/L — ABNORMAL LOW (ref 98–111)
Creatinine, Ser: 0.59 mg/dL — ABNORMAL LOW (ref 0.61–1.24)
GFR calc Af Amer: >60 mL/min (ref >60)
GFR calc non Af Amer: >60 mL/min (ref >60)
Glucose, Bld: 160 mg/dL — ABNORMAL HIGH (ref 70–99)
Potassium: 4.3 mmol/L (ref 3.5–5.1)
Sodium: 140 mmol/L (ref 135–145)

## 2019-08-20 LAB — BRAIN NATRIURETIC PEPTIDE: B Natriuretic Peptide: 587 pg/mL — ABNORMAL HIGH (ref 0.0–100.0)

## 2019-08-20 LAB — MAGNESIUM: Magnesium: 2.3 mg/dL (ref 1.7–2.4)

## 2019-08-20 LAB — GLUCOSE, CAPILLARY
Glucose-Capillary: 130 mg/dL — ABNORMAL HIGH (ref 70–99)
Glucose-Capillary: 154 mg/dL — ABNORMAL HIGH (ref 70–99)
Glucose-Capillary: 161 mg/dL — ABNORMAL HIGH (ref 70–99)

## 2019-08-20 LAB — HEMOGLOBIN AND HEMATOCRIT, BLOOD
HCT: 29.4 % — ABNORMAL LOW (ref 39.0–52.0)
Hemoglobin: 9.3 g/dL — ABNORMAL LOW (ref 13.0–17.0)

## 2019-08-20 LAB — PHOSPHORUS: Phosphorus: 3.4 mg/dL (ref 2.5–4.6)

## 2019-08-20 MED ORDER — MORPHINE SULFATE (PF) 2 MG/ML IV SOLN
2.0000 mg | Freq: Once | INTRAVENOUS | Status: AC
  Administered 2019-08-20: 2 mg via INTRAVENOUS
  Filled 2019-08-20: qty 1

## 2019-08-20 MED ORDER — LORAZEPAM 2 MG/ML IJ SOLN
INTRAMUSCULAR | Status: AC
  Filled 2019-08-20: qty 1

## 2019-08-20 MED ORDER — LORAZEPAM 2 MG/ML IJ SOLN
2.0000 mg | Freq: Once | INTRAMUSCULAR | Status: AC
  Administered 2019-08-20: 2 mg via INTRAVENOUS

## 2019-08-20 MED ORDER — MORPHINE SULFATE (PF) 4 MG/ML IV SOLN
INTRAVENOUS | Status: AC
  Filled 2019-08-20: qty 1

## 2019-08-20 MED ORDER — MORPHINE SULFATE (PF) 2 MG/ML IV SOLN
INTRAVENOUS | Status: AC
  Filled 2019-08-20: qty 1

## 2019-08-20 MED ORDER — MORPHINE SULFATE (PF) 2 MG/ML IV SOLN
2.0000 mg | Freq: Once | INTRAVENOUS | Status: AC
  Administered 2019-08-20: 2 mg via INTRAVENOUS

## 2019-08-20 MED ORDER — MORPHINE SULFATE (PF) 4 MG/ML IV SOLN
4.0000 mg | Freq: Once | INTRAVENOUS | Status: AC
  Administered 2019-08-20: 4 mg via INTRAVENOUS

## 2019-08-20 MED ORDER — MIDAZOLAM HCL 2 MG/2ML IJ SOLN
2.0000 mg | Freq: Once | INTRAMUSCULAR | Status: AC
  Administered 2019-08-20: 2 mg via INTRAVENOUS

## 2019-08-20 MED ORDER — MORPHINE 100MG IN NS 100ML (1MG/ML) PREMIX INFUSION: 1.0000 mg/h | INTRAVENOUS | Status: DC

## 2019-08-20 MED ORDER — FUROSEMIDE 10 MG/ML IJ SOLN
20.0000 mg | Freq: Once | INTRAMUSCULAR | Status: DC
  Filled 2019-08-20: qty 2

## 2019-08-20 MED ORDER — MIDAZOLAM HCL 2 MG/2ML IJ SOLN
INTRAMUSCULAR | Status: AC
  Filled 2019-08-20: qty 2

## 2019-08-20 MED ORDER — FUROSEMIDE 10 MG/ML IJ SOLN
40.0000 mg | Freq: Once | INTRAMUSCULAR | Status: AC
  Administered 2019-08-20: 40 mg via INTRAVENOUS

## 2019-08-21 ENCOUNTER — Ambulatory Visit: Payer: Medicare Other | Admitting: *Deleted

## 2019-08-21 ENCOUNTER — Telehealth: Payer: Self-pay | Admitting: Internal Medicine

## 2019-08-21 LAB — CULTURE, RESPIRATORY W GRAM STAIN

## 2019-08-21 NOTE — Telephone Encounter (Signed)
Called and expressed condolences to sister [name] on death of pt   [place]

## 2019-08-29 ENCOUNTER — Other Ambulatory Visit: Payer: Self-pay | Admitting: *Deleted

## 2019-08-29 NOTE — Patient Outreach (Signed)
[facility]) Care Management  08/29/2019  Dustin [name] 1955-11-11 [id number]  [facility] CM Multidisciplinary Case Conference re:  Dustin [name], 63 y/o male referred to [place] by [facility] Pharmacist embedded in patient's PCP office for assessment of community resource needs and ongoing reinforcement of self- health management of chronic disease state of COPD/ CHF. Patient has had no recent inpatient hospital admissions. Patient has history including, but not limited to, chronic respiratory failure; CHF; COPD on home O2 with ongoing tobacco use; pulmonary HTN; and anxiety  Since [facility] CM initially became involved in patient's care, he had recent hospitalization October 19-22, 2020 for COPD exacerbation/ acute on chronic respiratory failure  Unfortunately, patient experienced hospital re-admission August 05, 2019 at [facility] (26 days post-last hospital discharge) with shortness of breath and in acute respiratory failure; patient was admitted to ICU and emergently intubated.  Unfortunately, patient eventually passed away.  Case discussed during [facility] CM Multidisciplinary Case Conference  Oneta Rack, RN, BSN, [place] Coordinator [facility] Care Management  [phone]

## 2019-09-04 ENCOUNTER — Telehealth: Payer: Medicare Other

## 2019-09-16 ENCOUNTER — Ambulatory Visit: Payer: Medicare Other | Admitting: Internal Medicine

## 2019-09-19 NOTE — Discharge Summary (Signed)
DEATH SUMMARY    Name: Dustin [name] MRN: [id number] DOB: 1956-05-05     LOS: 15   SUBJECTIVE FINDINGS & SIGNIFICANT EVENTS   Patient description:  Dustin [name] is an 64 y.o. male smoker PTA, withhistory of COPD andchronic respiratory failure, chronic diastolic CHF, history of DVT on apixaban presented to the ER with complaints of having fever chills and fatigue.Patient's daughter went to check on him and found he was febrile and was brought to the ER. Patient admitted with acute on chronic hypoxic and hypercarbic respiratory failure failing BiPAP and ultimately being intubated and mechanically ventilated.    Lines / Drains: PIVx3  Cultures / Sepsis markers: Tracheal aspirate with MRSA  Antibiotics: vancomycin   Protocols / Consultants: Palliative team for GOC  Tests / Events: CXR with bilateral consolidated pneumonia  Overnight: Remains critically ill on MV    Events: 11/17: Intubated. Started on neo 11/18:PICC line placed.Remains intubated, FiO2 50%. On neo at 40 mcg. Sedated on 15 mcg propofol and 200 mcg fentanyl. Responds to verbal stimuli and follows commands. 11/19: Remains intubated and sedated. FiO2 55%. On neo at 22, vasopressin .03. Sedated with 25 propofol and 300 fentanyl. Unresponsive to verbal stimuli. 11/20: Pt confused and delirious when sedated weaned.Remains intubated and sedated - FiO2 50%, 3 mcg neo, .03 units vasopressin. Sedated on 20 mcg propofol, 300 mcg fentanyl.  11/21remains on vent, failed SAT/SBT 11/22 remains vent fio2 80%, all family members updated 11/23 remains on vent, pressors, severe hypoxia,family updated 11/24 unable to wean severe hypoxia 11/26 unable to wean due to persistent severe hypoxia, updated brother and sister 11/27 thick purulent  secretions, fever, increase FiO2 to 90%, antibiotics were started: Zosyn 11/28 Vancomycin added +GPC  11/29 Sputum growing staph aureus, susceptibilities pending, FiO2 had to be increased again 11/30-ventilator management, placed on pressure control with PEEP of 10 to optimize oxygenation and decrease auto PEEP secondary to dynamic hyperinflation 12/1- weaned down to 90%, HR stabalized post amiodarone/precedex infusion, patient is awake following commands, his pain is adequately controlled with dilaudid now previously was on fentanyl gtt. Has put out 750cc urine with low dose diuresis and is off vasopressors. 12/2-patient has become significantly worse with massive hemoptysis, actively pouring out of endotracheal tube.  We had emergently performed bedside bronchoscopy to evacuate bloody debris as well as ice cold lavage to decrease bleeding with goal to improve severe hypoxemia and hemorrhage.  Patient is alert and able to communicate gesturing that he wants endotracheal tube removed.  We had explained that this could mean that he may pass away however he keeps gesturing and tugging at endotracheal tube for removal.  Nurse Virgie Dad had also discussed with patient and he again wished for endotracheal tube to be removed despite risk of death.  Patient appears to be extremely uncomfortable with suffering.  I had reached out to [facility] and discussed that patient had significant amount of high-volume bleeding had worsening of his pulmonary physiology based on ventilator graphics including elevated peak and plateau pressures, explained patient had repeatedly requested for endotracheal tube to be removed with the understanding that he might pass.  [place] Citizen understands that his overall prognosis is very poor and agrees with liberation from mechanical ventilation to respect patient's wishes with plan for compassionate extubation to comfort measures. She is thankful for care.  12/2 11/   PAST MEDICAL HISTORY   Past Medical History:  Diagnosis Date  . Allergy   . Anxiety   . Asthma   .  Chronic diastolic CHF (congestive heart failure) (HCC)    a. echo 07/2013: EF 60-65%, DD, biatrial dilatation, Ao sclerosis, dilated RV, moderate pulmonary HTN, elevated CV and RA pressures; b. patient reported echo at Dr. Milta Deiters office 02/2015 - his office does not have record of him being a pt there c. echo 11/2015: EF 60-65%, Grade 1 DD, mod-severe pulm pressures  . Chronic respiratory failure (HCC)    a. on 2L via nasal cannula; b. secondary to COPD  . COPD (chronic obstructive pulmonary disease) (HCC)    on 2L continuous   . Depression   . Emphysema of lung (HCC)   . GERD (gastroesophageal reflux disease)   . HCAP (healthcare-associated pneumonia)    01/22/18-01/28/18   . Headache   . Hypertension   . Leg DVT (deep venous thromboembolism), acute, bilateral (HCC)    02/26/18  . Personal history of tobacco use, presenting hazards to health 08/17/2015  . Pulmonary embolism (HCC)    02/26/18  . Pulmonary HTN (HCC)   . Tobacco abuse      SURGICAL HISTORY   Past Surgical History:  Procedure Laterality Date  . ABDOMINAL SURGERY    . ADENOIDECTOMY    . CARPAL TUNNEL RELEASE Right   . corpal tunnel Right   . ELBOW SURGERY Right    repaired tendon  . hemmorhoid N/A   . HEMORRHOID SURGERY    . NASAL SINUS SURGERY    . NASAL SINUS SURGERY     1970s  . NOSE SURGERY    . reflux surgery     1994  . ROTATOR CUFF REPAIR Right   . SHOULDER SURGERY    . TENNIS ELBOW RELEASE/NIRSCHEL PROCEDURE Right   . TONSILLECTOMY    . URETHRA SURGERY     surgery 6 times from age [age] yrs old  . URETHRA SURGERY       FAMILY HISTORY   Family History  Problem Relation Age of Onset  . CAD Father   . Hyperlipidemia Father   . Stroke Father   . Heart disease Father   . Arthritis Father   . Hearing loss Father   . Hypertension Father   . Hypertension Mother   . Peripheral Artery Disease  Mother   . Rheum arthritis Mother   . Asthma Mother   . Bipolar disorder Mother   . Depression Mother   . Malignant hypertension Mother   . Arthritis Mother   . Hearing loss Mother   . Heart disease Mother   . Hyperlipidemia Mother   . Kidney disease Mother   . Birth defects Brother   . Heart disease Brother   . Alcohol abuse Daughter   . Arthritis Daughter   . Asthma Daughter   . Depression Daughter   . Drug abuse Daughter   . Miscarriages / [place] Daughter   . Intellectual disability Daughter   . Kidney disease Son   . Birth defects Paternal Grandmother   . Depression Paternal Grandmother   . Heart disease Paternal Grandmother   . Birth defects Sister   . Diabetes Neg Hx      SOCIAL HISTORY   Social History   Tobacco Use  . Smoking status: Current Every Day Smoker    Packs/day: 0.10    Years: 40.00    Pack years: 4.00    Types: Cigarettes    Last attempt to quit: 01/14/2018    Years since quitting: 1.6  . Smokeless tobacco: Never Used  Substance Use Topics  .  Alcohol use: No  . Drug use: No     MEDICATIONS   Current Medication: No current facility-administered medications for this encounter.   Current Outpatient Medications:  .  Acetylcysteine (NAC) 600 MG CAPS, Take 1 tablet by mouth daily. , Disp: , Rfl:  .  ALPRAZolam (XANAX) 1 MG tablet, Take 1 tablet (1 mg total) by mouth 3 (three) times daily as needed for anxiety., Disp: 15 tablet, Rfl: 0 .  antiseptic oral rinse (BIOTENE) LIQD, 15 mLs by Mouth Rinse route daily as needed for dry mouth., Disp: 473 mL, Rfl: 12 .  apixaban (ELIQUIS) 5 MG TABS tablet, Take 1 tablet (5 mg total) by mouth 2 (two) times daily., Disp: 180 tablet, Rfl: 3 .  azithromycin (ZITHROMAX) 250 MG tablet, Take 250 mg by mouth every other day. , Disp: , Rfl:  .  b complex vitamins capsule, Take 1 capsule by mouth daily., Disp: , Rfl:  .  carbamide peroxide (DEBROX) 6.5 % OTIC solution, Place 5 drops into both ears 2 (two) times  daily. Prn x 4-7 days, Disp: 15 mL, Rfl: 11 .  Cholecalciferol (VITAMIN D) 2000 units tablet, Take 2,000 Units by mouth 2 (two) times a day. , Disp: , Rfl:  .  clobetasol (TEMOVATE) 0.05 % external solution, Apply 1 application topically 2 (two) times daily. Prn to scalp and ears, Disp: 50 mL, Rfl: 11 .  diltiazem (CARDIZEM) 30 MG tablet, Take 1 tablet (30 mg total) by mouth 3 (three) times daily as needed. (Patient taking differently: Take 30 mg by mouth 3 (three) times daily as needed (heartrate). ), Disp: 180 tablet, Rfl: 3 .  escitalopram (LEXAPRO) 20 MG tablet, Take 1 tablet (20 mg total) by mouth at bedtime., Disp: 90 tablet, Rfl: 3 .  fluticasone (FLONASE) 50 MCG/ACT nasal spray, Place 2 sprays into both nostrils daily as needed for allergies or rhinitis., Disp: 16 g, Rfl: 11 .  furosemide (LASIX) 40 MG tablet, Take 1 tablet (40 mg) by mouth twice daily, Disp: 180 tablet, Rfl: 3 .  gabapentin (NEURONTIN) 300 MG capsule, TAKE 1 TO 2 CAPSULES BY MOUTH THREE TIMES DAILY (Patient taking differently: Take 300-600 mg by mouth 3 (three) times daily. ), Disp: 520 capsule, Rfl: 11 .  hydrocortisone 2.5 % cream, Apply topically 2 (two) times daily as needed. face (Patient taking differently: Apply 1 application topically 2 (two) times daily as needed (skin irritations). (apply to the face)), Disp: 30 g, Rfl: 11 .  ipratropium-albuterol (DUONEB) 0.5-2.5 (3) MG/3ML SOLN, Take 3 mLs by nebulization every 6 (six) hours as needed. (Patient taking differently: Take 3 mLs by nebulization every 6 (six) hours as needed (shortness of breath/wheezing). ), Disp: 1080 mL, Rfl: 3 .  magnesium oxide (MAG-OX) 400 MG tablet, Take 400 mg by mouth daily., Disp: , Rfl:  .  mometasone-formoterol (DULERA) 200-5 MCG/ACT AERO, Inhale 2 puffs into the lungs 2 (two) times daily., Disp: 1 Inhaler, Rfl: 4 .  nystatin (MYCOSTATIN) 100000 UNIT/ML suspension, Use as directed 5 mLs (500,000 Units total) in the mouth or throat 4 (four)  times daily. Swish and spit 3 or 4 times daily for at least 5 days.  Then may be used as needed thereafter for thrush, Disp: 473 mL, Rfl: 0 .  potassium chloride (KLOR-CON) 10 MEQ tablet, Take 4 tablets (40 meq) by mouth once daily (Patient taking differently: Take 20 mEq by mouth daily. 20 every other day, alternate with 40 every other day), Disp: 360 tablet, Rfl: 3 .  Probiotic Product (PROBIOTIC PO), Take 1 capsule by mouth 2 (two) times a day. , Disp: , Rfl:  .  rOPINIRole (REQUIP) 0.25 MG tablet, Take 1 tablet (0.25 mg total) by mouth 2 (two) times daily., Disp: 180 tablet, Rfl: 3 .  sodium chloride (OCEAN) 0.65 % SOLN nasal spray, Place 1-2 sprays into both nostrils as needed for congestion., Disp: 480 mL, Rfl: 12 .  Spacer/Aero-Holding Chambers (OPTICHAMBER ADVANTAGE-LG MASK) MISC, Use as directed with inhaler diag  j44.1, Disp: 2 each, Rfl: 1 .  theophylline (UNIPHYL) 400 MG 24 hr tablet, Take 1 tablet (400 mg total) by mouth every morning., Disp: 90 tablet, Rfl: 3 .  Tiotropium Bromide Monohydrate (SPIRIVA RESPIMAT) 2.5 MCG/ACT AERS, Inhale 2 puffs into the lungs daily., Disp: 3 Inhaler, Rfl: 3 .  traZODone (DESYREL) 50 MG tablet, Take 1-2 tablets (50-100 mg total) by mouth at bedtime as needed for sleep., Disp: 60 tablet, Rfl: 11 .  AMBULATORY NON FORMULARY MEDICATION, 1 each by Does not apply route as needed. Please provide patient with "So Clean" CPAP cleaning device to be used as needed. (Patient not taking: Reported on 07/28/2019), Disp: 1 Device, Rfl: 0 .  doxycycline (VIBRA-TABS) 100 MG tablet, Take 1 tablet (100 mg total) by mouth daily. For 1 more day. (Patient not taking: Reported on 07/28/2019), Disp: 1 tablet, Rfl: 0 .  ketoconazole (NIZORAL) 2 % shampoo, Apply 1 application topically 2 (two) times a week. Let stand for 5 minutes (Patient not taking: Reported on August 11, 2019), Disp: 120 mL, Rfl: 3    ALLERGIES   Asa [aspirin], Bee venom, Codeine, Ibuprofen, and Iodinated diagnostic  agents    REVIEW OF SYSTEMS     Unable to obtain due to critically ill status on mechanical ventilation  PHYSICAL EXAMINATION   Vital Signs:    GENERAL: Sedated on mechanical ventilation HEAD: Normocephalic, atraumatic.  EYES: Pupils equal, round, reactive to light.  No scleral icterus.  MOUTH: Moist mucosal membrane. NECK: Supple. No thyromegaly. No nodules. No JVD.  PULMONARY: Rhonchorous breath sounds bilaterally with mechanical ventilation in the background CARDIOVASCULAR: S1 and S2. Regular rate and rhythm. No murmurs, rubs, or gallops.  GASTROINTESTINAL: Soft, nontender, non-distended. No masses. Positive bowel sounds. No hepatosplenomegaly.  MUSCULOSKELETAL: No swelling, clubbing, or edema.  NEUROLOGIC: GCS 4 T SKIN:intact,warm,dry   PERTINENT DATA     Infusions:  Scheduled Medications:  PRN Medications:  Hemodynamic parameters:   Intake/Output: 12/02 0701 - 12/03 0700 In: -  Out: 600 [Urine:600]  Ventilator  Settings:     LAB RESULTS:  Basic Metabolic Panel: Recent Labs  Lab 08/15/19 0515 08/16/19 0456 08/17/19 0500 08/17/19 2103 08/18/19 0544 08/19/19 0545 08/22/2019 0328  NA 139 139 139 140 141 139 140  K 4.3 4.5 3.7 4.0 4.3 4.3 4.3  CL 95* 95* 95* 93* 96* 94* 94*  CO2 35* 36* 36* 35* 36* 37* 39*  GLUCOSE 142* 133* 148* 123* 149* 162* 160*  BUN 42* 42* 37* 46* 46* 48* 42*  CREATININE 0.53* 0.71 0.55* 0.70 0.65 0.58* 0.59*  CALCIUM 8.7* 8.8* 8.2* 8.7* 8.5* 8.6* 8.5*  MG 2.4  --  1.8 2.3 2.2  --  2.3  PHOS  --  3.6  --  4.4  --   --  3.4   Liver Function Tests: Recent Labs  Lab 08/15/19 0504 08/16/19 0456  ALBUMIN 2.7* 2.9*   No results for input(s): LIPASE, AMYLASE in the last 168 hours. No results for input(s): AMMONIA in the last 168  hours. CBC: Recent Labs  Lab 08/16/19 0500 08/17/19 0500 08/18/19 1730 08/19/19 0545 09/05/2019 0328 09/04/2019 1220  WBC 19.0* 14.2* 16.9* 12.4* 10.6*  --   NEUTROABS 17.3*  --   --   --   9.9*  --   HGB 11.4* 10.2* 10.8* 9.8* 9.9* 9.3*  HCT 36.6* 31.3* 35.2* 32.2* 31.9* 29.4*  MCV 102.8* 98.4 104.8* 104.5* 103.2*  --   PLT 159 139* 170 138* 132*  --    Cardiac Enzymes: No results for input(s): CKTOTAL, CKMB, CKMBINDEX, TROPONINI in the last 168 hours. BNP: Invalid input(s): POCBNP CBG: Recent Labs  Lab 08/19/19 1657 08/19/19 2106 08/23/2019 0023 09/14/2019 0327 08/26/2019 0733  GLUCAP 122* 133* 130* 161* 154*     IMAGING RESULTS:  Imaging: Dg Chest Port 1 View  Result Date: 08/25/2019 CLINICAL DATA:  Shortness of breath. Intubated patient. Follow-up exam. EXAM: PORTABLE CHEST 1 VIEW COMPARISON:  08/30/2019 at 5:50 a.m. FINDINGS: Patchy airspace and interstitial lung opacities, predominantly in the lower lungs, are without significant change from the earlier exam. Lungs are hyperexpanded with relative lucency in the upper lobes consistent with emphysema. Possible small effusions. No evidence of a pneumothorax. Endotracheal tube, nasal/orogastric tube and right PICC are unchanged. IMPRESSION: 1. No change from the earlier exam. 2. Airspace and interstitial lung opacities, which may reflect multifocal pneumonia or asymmetric edema. This is superimposed on COPD/emphysema. 3. Stable support apparatus. Electronically Signed   By: Amie Portlandavid  Ormond M.D.   On: 09/15/2019 12:17   Dg Chest Port 1 View  Result Date: 08/29/2019 CLINICAL DATA:  Hypoxia EXAM: PORTABLE CHEST 1 VIEW COMPARISON:  August 18, 2019 FINDINGS: Endotracheal tube tip is 8.2 cm above the carina. Nasogastric tube tip and side port are in the stomach. Central catheter tip is in the superior vena cava. No pneumothorax. There remains interstitial and alveolar opacities in the lung bases with small pleural effusions bilaterally. Note that there is bullous disease in the upper lobes. No evident adenopathy. No bone lesions. IMPRESSION: Tube and catheter positions as described without pneumothorax. Upper lobe bullous disease.  Interstitial and alveolar opacity, likely edema, in the mid and lower lung zones with small pleural effusions. Stable cardiomegaly. Appearance most consistent with congestive heart failure superimposed on underlying COPD. A degree of superimposed pneumonia in the bases cannot be excluded. Electronically Signed   By: Bretta BangWilliam  Woodruff III M.D.   On: 08/27/2019 07:29    ASSESSMENT AND PLAN    -Multidisciplinary rounds held today  Acute Hypoxic Respiratory Failure with severe ARDS -MRSA community-acquired pneumonia -Continue vancomycin-appreciate Pharm.D.  -Stop nebulizer therapy due to SVT -Currently with severe ARDS PaO2/FiO2 ratio <100 -Ventilator management-optimized lung physiology currently on pressure control mode with 10 of PEEP to minimize driving pressure and allow for acceptable oxygenation -Considering paralysis via vecuronium if necessary -Borderline hypotensive -Stopping fentanyl due to respiratory muscle stiffness hindering lung mechanics, will initiate ketamine to potentiate bronchodilation and adjuvant effect to increase BP -Background of advanced bullous emphysema  Massive hemoptysis  - possibly related to severe bullous emphysema with MRSA pneumonia  - s/p emergency bronchoscopy with evacuation of debris and ice cold lavage , with resolution of bleeding post procedure.   - discussed hemoptysis with POA Caroline   Chronic heart failure with preserved EF - diresed with lasix today post improved hemodynamics  -ICU monitoring     Sepsis -Due to MRSA pneumonia -Continue vancomycin -Consider vasopressors if MAP less than 60 -Starting Solu-Medrol 40 twice daily   ID -continue  IV abx as prescibed -follow up cultures  GI/Nutrition GI PROPHYLAXIS as indicated DIET-->TF's as tolerated Constipation protocol as indicated  ENDO - ICU hypoglycemic\Hyperglycemia protocol -check FSBS per protocol   ELECTROLYTES -follow labs as needed -replace as needed -pharmacy  consultation   DVT/GI PRX ordered -SCDs  TRANSFUSIONS AS NEEDED MONITOR FSBS ASSESS the need for LABS as needed   Critical care provider statement:    Critical care time (minutes):  108   Critical care time was exclusive of:  Separately billable procedures and treating other patients   Critical care was necessary to treat or prevent imminent or life-threatening deterioration of the following conditions:   Sepsis, MRSA pneumonia, acute hypoxemic respiratory failure, severe ARDS, multiple comorbid conditions   Critical care was time spent personally by me on the following activities:  Development of treatment plan with patient or surrogate, discussions with consultants, evaluation of patient's response to treatment, examination of patient, obtaining history from patient or surrogate, ordering and performing treatments and interventions, ordering and review of laboratory studies and re-evaluation of patient's condition.  I assumed direction of critical care for this patient from another provider in my specialty: no    This document was prepared using Dragon voice recognition software and may include unintentional dictation errors.    Ottie Glazier, M.D.  Division of [facility]

## 2019-09-19 NOTE — Progress Notes (Signed)
RN and RT at the bedside, pt respirations in the 50's and tachycardic HR in 150's. Given IV metoprolol and HR went down to the 90's. After RT and RN listened to pt's lungs, it was noticed that he was not moving air. RT lavaged and and stat chest xray ordered. Pt had a large amount of blood in his ETT. MD spoke with POA and she agreed to emergent bronch and extubation to comfort care when done with bronch. Pt given ativan and morphine for comfort and passed away comfortably at 1422. Family at the bedside.

## 2019-09-19 NOTE — Consult Note (Signed)
Consultation Note Date: 08/28/19   Patient Name: Dustin [name]  DOB: August 06, 1956  MRN: [id number]  Age / Sex: 64 y.o., male   PCP: McLean-Scocuzza, Nino Glow, MD Referring Physician: Ottie Glazier, MD   REASON FOR CONSULTATION:Establishing goals of care, Non pain symptom management, Pain control, Psychosocial/spiritual support and Terminal Care  Palliative Care consult requested for this 64 y.o. male with multiple medical problems including COPD with chronic respiratory failure, diastolic CHF, history of DVT on apixaban , GERD, hypertension, tobacco use, pulmonary hypertension, and emphysema. He presented to ED with complaints of fever, chills, and shortness of breath. During his ED work-up patient was placed on BiPAP. Chest x-ray showed infiltrates concerning for pneumonia. COVID-19 negative. Patient developed worsening respiratory failure and required emergent intubation. Continues to remain intubated with multiple failed attempts to wean. He developed significant hemoptysis leaking from ET tube on 12/2. Emergent bedside bronchoscopy and ice lavage at bedside performed. Patient alert and gestured to provider and medical team wishes for ET tube to be removed.   Clinical Assessment and Goals of Care: I have reviewed medical records including lab results, imaging, Epic notes, and MAR, received report from the bedside RN, and assessed the patient. Sister, Dustin [name] was updated by Dr. Lanney Gins via phone, in addition to nursing team.   Dustin [name] is aware of patient's overall poor prognosis, patient's expressed wishes (gestures) for tube removal, with understanding patient will most likely pass away if extubated. Dustin [name] has informed medical team of her wishes to compassionately extubate to comfort.   She is planning to come to hospital to visit and is notifying patient's children.   Patient was successfully extubated with some signs of respiratory distress/air hunger. Medications for comfort  administered. I assisted with care of patient and provided support and comfort to Mr. Lines in the absence of family during this time.   Sister called several times and spoke with nursing team for updates and indicating she was on the way to be at the bedside with patient.   1320:Daughter, Dustin [name] arrived at the bedside. She is tearfully and acknowledging to her father she is here for him and her love for him. She is holding his hand and hugging him. Emotional support provided. Daughter updated with patients condition. She is tearful in expressing appreciation of all care received and seeing/knowing her father is not in any distress and is comfortable. She is requesting he not suffer and medications for pain and anxiety be given if needed to allow a peaceful end-of-life. Daughter assured nursing staff is taking great care of him and administering medications as appropriate. She again verbalizes appreciation of care. She requesting approval to face time her brother given he cannot be at the bedside. She is aware this is acceptable. Son explained patient is resting comfortably and peaceful.   All questions answered and support provided.    SOCIAL HISTORY:     reports that he has been smoking cigarettes. He has a 4.00 pack-year smoking history. He has never used smokeless tobacco. He reports that he does not drink alcohol or use drugs.  CODE STATUS: DNR  ADVANCE DIRECTIVES: Dustin [name] [name] (sister/poa)  SYMPTOM MANAGEMENT: per attending/see below   Palliative Prophylaxis:   Aspiration, Eye Care, Frequent Pain Assessment and Oral Care  PSYCHO-SOCIAL/SPIRITUAL:  Support System: Family   Desire for further Chaplaincy support: YES   Additional Recommendations (Limitations, Scope, Preferences):  Full Comfort Care   PAST MEDICAL HISTORY: Past Medical History:  Diagnosis Date  . Allergy   .  Anxiety   . Asthma   . Chronic diastolic CHF (congestive heart failure) ([place])    a. echo  07/2013: EF 60-65%, DD, biatrial dilatation, Ao sclerosis, dilated RV, moderate pulmonary HTN, elevated CV and RA pressures; b. patient reported echo at Dr. Laurelyn Sickle office 02/2015 - his office does not have record of him being a pt there c. echo 11/2015: EF 60-65%, Grade 1 DD, mod-severe pulm pressures  . Chronic respiratory failure ([facility])    a. on 2L via nasal cannula; b. secondary to COPD  . COPD (chronic obstructive pulmonary disease) ([facility])    on 2L continuous   . Depression   . Emphysema of lung ([place])   . GERD (gastroesophageal reflux disease)   . HCAP (healthcare-associated pneumonia)    01/22/18-01/28/18   . Headache   . Hypertension   . Leg DVT (deep venous thromboembolism), acute, bilateral ([place])    02/26/18  . Personal history of tobacco use, presenting hazards to health 08/17/2015  . Pulmonary embolism ([place])    02/26/18  . Pulmonary HTN ([place])   . Tobacco abuse     PAST SURGICAL HISTORY:  Past Surgical History:  Procedure Laterality Date  . ABDOMINAL SURGERY    . ADENOIDECTOMY    . CARPAL TUNNEL RELEASE Right   . corpal tunnel Right   . ELBOW SURGERY Right    repaired tendon  . hemmorhoid N/A   . HEMORRHOID SURGERY    . NASAL SINUS SURGERY    . NASAL SINUS SURGERY     1970s  . NOSE SURGERY    . reflux surgery     1994  . ROTATOR CUFF REPAIR Right   . SHOULDER SURGERY    . TENNIS ELBOW RELEASE/NIRSCHEL PROCEDURE Right   . TONSILLECTOMY    . URETHRA SURGERY     surgery 6 times from age [age] yrs old  . URETHRA SURGERY      ALLERGIES:  is allergic to asa [aspirin]; bee venom; codeine; ibuprofen; and iodinated diagnostic agents.   MEDICATIONS:  Current Facility-Administered Medications  Medication Dose Route Frequency Provider Last Rate Last Dose  . 0.9 %  sodium chloride infusion  250 mL Intravenous Continuous Flora Lipps, MD 10 mL/hr at 08/18/19 1345 250 mL at 08/18/19 1345  . acetaminophen (TYLENOL) tablet 650 mg  650 mg Oral Q6H PRN Tukov-Yual, Magdalene S, NP    650 mg at 08/18/19 1349   Or  . acetaminophen (TYLENOL) suppository 650 mg  650 mg Rectal Q6H PRN Tukov-Yual, Magdalene S, NP      . amiodarone (NEXTERONE PREMIX) 360-4.14 MG/200ML-% (1.8 mg/mL) IV infusion  60 mg/hr Intravenous Continuous Awilda Bill, NP 33.3 mL/hr at Sep 15, 2019 0134 60 mg/hr at 09-15-19 0134  . budesonide (PULMICORT) nebulizer solution 0.5 mg  0.5 mg Nebulization BID Flora Lipps, MD   0.5 mg at 09-15-19 0755  . chlorhexidine gluconate (MEDLINE KIT) (PERIDEX) 0.12 % solution 15 mL  15 mL Mouth Rinse BID Flora Lipps, MD   15 mL at 09/15/19 0737  . Chlorhexidine Gluconate Cloth 2 % PADS 6 each  6 each Topical Daily Flora Lipps, MD   6 each at 09/15/19 0856  . cholecalciferol (VITAMIN D3) tablet 2,000 Units  2,000 Units Per Tube Daily Flora Lipps, MD   2,000 Units at 15-Sep-2019 0855  . dexmedetomidine (PRECEDEX) 400 MCG/100ML (4 mcg/mL) infusion  0.4-1.2 mcg/kg/hr Intravenous Titrated Awilda Bill, NP 21.5 mL/hr at 15-Sep-2019 0843 0.8 mcg/kg/hr at 09/15/19 0843  . escitalopram (LEXAPRO) tablet  20 mg  20 mg Per Tube QHS Flora Lipps, MD   20 mg at 08/19/19 2127  . famotidine (PEPCID) tablet 20 mg  20 mg Per Tube BID Flora Lipps, MD   20 mg at 2019-09-08 0854  . feeding supplement (PRO-STAT SUGAR FREE 64) liquid 60 mL  60 mL Per Tube QID Flora Lipps, MD   60 mL at 09/08/19 0856  . feeding supplement (VITAL HIGH PROTEIN) liquid 1,000 mL  1,000 mL Per Tube Q24H Flora Lipps, MD   1,000 mL at 08/19/19 1439  . HYDROmorphone (DILAUDID) injection 0.5-1 mg  0.5-1 mg Intravenous Q4H PRN Awilda Bill, NP   1 mg at 09/08/2019 1141  . insulin aspart (novoLOG) injection 0-9 Units  0-9 Units Subcutaneous Q4H Flora Lipps, MD   2 Units at 2019/09/08 0850  . ipratropium-albuterol (DUONEB) 0.5-2.5 (3) MG/3ML nebulizer solution 3 mL  3 mL Nebulization Q4H Flora Lipps, MD   3 mL at 09-08-19 1121  . ketamine (KETALAR) 500 mg in sodium chloride 0.9 % 100 mL (5 mg/mL) infusion  0.5 mg/kg/hr  Intravenous Continuous Ottie Glazier, MD 10.76 mL/hr at 2019/09/08 1047 0.5 mg/kg/hr at September 08, 2019 1047  . MEDLINE mouth rinse  15 mL Mouth Rinse 10 times per day Flora Lipps, MD   15 mL at 09-08-19 1000  . methylPREDNISolone sodium succinate (SOLU-MEDROL) 40 mg/mL injection 40 mg  40 mg Intravenous BID Flora Lipps, MD   40 mg at 09-08-2019 0855  . metoprolol tartrate (LOPRESSOR) injection 2.5 mg  2.5 mg Intravenous Q6H PRN Awilda Bill, NP   2.5 mg at 2019-09-08 1126  . midodrine (PROAMATINE) tablet 5 mg  5 mg Per Tube TID WC Ottie Glazier, MD   5 mg at 09-08-2019 0736  . morphine 2 MG/ML injection 2 mg  2 mg Intravenous Once Pickenpack-Cousar, Caron Tardif N, NP      . morphine 4 MG/ML injection           . multivitamin liquid 15 mL  15 mL Per Tube Daily Flora Lipps, MD   15 mL at September 08, 2019 0855  . mupirocin ointment (BACTROBAN) 2 %   Nasal BID Flora Lipps, MD      . norepinephrine (LEVOPHED) 4 mg in sodium chloride 0.9 % 250 mL (0.016 mg/mL) infusion  0-40 mcg/min Intravenous Titrated Ottie Glazier, MD 7.5 mL/hr at 2019-09-08 1237 2 mcg/min at 2019/09/08 1237  . polyethylene glycol (MIRALAX / GLYCOLAX) packet 17 g  17 g Per Tube Daily Charlett Nose, RPH   17 g at 08/18/19 1051  . polyvinyl alcohol (LIQUIFILM TEARS) 1.4 % ophthalmic solution 1 drop  1 drop Both Eyes PRN Tukov-Yual, Magdalene S, NP   1 drop at 08/19/19 1400  . senna-docusate (Senokot-S) tablet 1 tablet  1 tablet Per Tube BID Charlett Nose, RPH   1 tablet at 08/18/19 2213  . sodium chloride flush (NS) 0.9 % injection 10-40 mL  10-40 mL Intracatheter Q12H Flora Lipps, MD   10 mL at 09-08-2019 0857  . sodium chloride flush (NS) 0.9 % injection 10-40 mL  10-40 mL Intracatheter PRN Flora Lipps, MD   10 mL at 08/06/19 2112    VITAL SIGNS: BP 98/72   Pulse 86   Temp 99.5 F (37.5 C) (Oral)   Resp (!) 22   Ht '5\' 10"'$  (1.778 m)   Wt 108 kg   SpO2 92%   BMI 34.16 kg/m  Filed Weights   08/17/19 0450 08/18/19 0356 09/08/2019 0500  Weight: 109.9 kg 107.6 kg 108 kg    Estimated body mass index is 34.16 kg/m as calculated from the following:   Height as of this encounter: 5' 10" (1.778 m).   Weight as of this encounter: 108 kg.  LABS: CBC:    Component Value Date/Time   WBC 10.6 (H) 08-22-2019 0328   HGB 9.3 (L) August 22, 2019 1220   HGB 13.6 07/23/2014 2053   HCT 29.4 (L) 2019-08-22 1220   HCT 42.0 07/23/2014 2053   PLT 132 (L) Aug 22, 2019 0328   PLT 213 07/23/2014 2053   Comprehensive Metabolic Panel:    Component Value Date/Time   NA 140 08-22-19 0328   NA 145 (H) 04/01/2019 1439   NA 139 07/23/2014 2135   K 4.3 08/22/2019 0328   K 4.1 07/23/2014 2135   CO2 39 (H) 08-22-2019 0328   CO2 25 07/23/2014 2135   BUN 42 (H) 08-22-19 0328   BUN 11 04/01/2019 1439   BUN 13 07/23/2014 2135   CREATININE 0.59 (L) Aug 22, 2019 0328   CREATININE 0.90 07/23/2014 2135   ALBUMIN 2.9 (L) 08/16/2019 0456   ALBUMIN 2.8 (L) 07/23/2014 2135     Review of Systems  Unable to perform ROS: Acuity of condition   Physical Exam General: some respiratory distress, chronically-ill appearing  Cardiovascular: regular rate and rhythm Pulmonary: bilateral rhonchi, audible secretions Abdomen: soft, nontender, + bowel sounds  Prognosis: s/p compassionate extubation (min-hours)   Discharge Planning:  Anticipated Hospital Death  Recommendations:  DNR  Full comfort care s/p compassionate wean  Morphine PRN for air hunger/respiratory distress  Ativan PRN for agitation  Spiritual support stat consult for EOL  Dr. Lanney Gins transitioning patient to full comfort.   PMT will support and follow. Please call/page if needed.    Palliative Performance Scale: PPS 10%              Family expressed understanding and was in agreement with this plan.   Thank you for allowing the Palliative Medicine Team to assist in the care of this patient.  Time In: 8016 Time Out: 1335 Time Total: 50 min.   Visit consisted of  counseling and education dealing with the complex and emotionally intense issues of symptom management and palliative care in the setting of serious and potentially life-threatening illness.Greater than 50%  of this time was spent counseling and coordinating care related to the above assessment and plan.  Signed by:  Alda Lea, AGPCNP-BC Palliative Medicine Team  Phone: [phone] Fax: [phone] Pager: [phone] Amion: Bjorn Pippin

## 2019-09-19 NOTE — Progress Notes (Signed)
CRITICAL CARE PROGRESS NOTE    Name: Dustin [name] MRN: [id number] DOB: Oct 26, 1955     LOS: 43   SUBJECTIVE FINDINGS & SIGNIFICANT EVENTS   Patient description:  Dustin [name] is an 64 y.o. male smoker PTA, withhistory of COPD andchronic respiratory failure, chronic diastolic CHF, history of DVT on apixaban presented to the ER with complaints of having fever chills and fatigue.Patient's daughter went to check on him and found he was febrile and was brought to the ER. Patient admitted with acute on chronic hypoxic and hypercarbic respiratory failure failing BiPAP and ultimately being intubated and mechanically ventilated.    Lines / Drains: PIVx3  Cultures / Sepsis markers: Tracheal aspirate with MRSA  Antibiotics: vancomycin   Protocols / Consultants: Palliative team for [place]  Tests / Events: CXR with bilateral consolidated pneumonia  Overnight: Remains critically ill on MV    Events: 11/17: Intubated. Started on neo 11/18:PICC line placed.Remains intubated, FiO2 50%. On neo at 40 mcg. Sedated on 15 mcg propofol and 200 mcg fentanyl. Responds to verbal stimuli and follows commands. 11/19: Remains intubated and sedated. FiO2 55%. On neo at 22, vasopressin .03. Sedated with 25 propofol and 300 fentanyl. Unresponsive to verbal stimuli. 11/20: Pt confused and delirious when sedated weaned.Remains intubated and sedated - FiO2 50%, 3 mcg neo, .03 units vasopressin. Sedated on 20 mcg propofol, 300 mcg fentanyl.  11/21remains on vent, failed SAT/SBT 11/22 remains vent fio2 80%, all family members updated 11/23 remains on vent, pressors, severe hypoxia,family updated 11/24 unable to wean severe hypoxia 11/26 unable to wean due to persistent severe hypoxia, updated brother and sister 11/27  thick purulent secretions, fever, increase FiO2 to 90%, antibiotics were started: Zosyn 11/28 Vancomycin added +GPC _0  11/29 Sputum growing staph aureus, susceptibilities pending, FiO2 had to be increased again 11/30-ventilator management, placed on pressure control with PEEP of 10 to optimize oxygenation and decrease auto PEEP secondary to dynamic hyperinflation 12/1- weaned down to 90%, HR stabalized post amiodarone/precedex infusion, patient is awake following commands, his pain is adequately controlled with dilaudid now previously was on fentanyl gtt. Has put out 750cc urine with low dose diuresis and is off vasopressors. 12/2-patient has become significantly worse with massive hemoptysis, actively pouring out of endotracheal tube.  We had emergently performed bedside bronchoscopy to evacuate bloody debris as well as ice cold lavage to decrease bleeding with goal to improve severe hypoxemia and hemorrhage.  Patient is alert and able to communicate gesturing that he wants endotracheal tube removed.  We had explained that this could mean that he may pass away however he keeps gesturing and tugging at endotracheal tube for removal.  Nurse Konrad Felix had also discussed with patient and he again wished for endotracheal tube to be removed despite risk of death.  Patient appears to be extremely uncomfortable with suffering.  I had reached out to [facility] and discussed that patient had significant amount of high-volume bleeding had worsening of his pulmonary physiology based on ventilator graphics including elevated peak and plateau pressures, explained patient had repeatedly requested for endotracheal tube to be removed with the understanding that he might pass.  [name] understands that his overall prognosis is very poor and agrees with liberation from mechanical ventilation to respect patient's wishes with plan for compassionate extubation to comfort measures. She is thankful for care.    12/2 11/  PAST MEDICAL HISTORY   Past Medical History:  Diagnosis Date   Allergy    Anxiety  Asthma    Chronic diastolic CHF (congestive heart failure) ([place])    a. echo 07/2013: EF 60-65%, DD, biatrial dilatation, Ao sclerosis, dilated RV, moderate pulmonary HTN, elevated CV and RA pressures; b. patient reported echo at Dr. Laurelyn Sickle office 02/2015 - his office does not have record of him being a pt there c. echo 11/2015: EF 60-65%, Grade 1 DD, mod-severe pulm pressures   Chronic respiratory failure ([place])    a. on 2L via nasal cannula; b. secondary to COPD   COPD (chronic obstructive pulmonary disease) ([place])    on 2L continuous    Depression    Emphysema of lung ([place])    GERD (gastroesophageal reflux disease)    HCAP (healthcare-associated pneumonia)    01/22/18-01/28/18    Headache    Hypertension    Leg DVT (deep venous thromboembolism), acute, bilateral ([place])    02/26/18   Personal history of tobacco use, presenting hazards to health 08/17/2015   Pulmonary embolism ([place])    02/26/18   Pulmonary HTN ([place])    Tobacco abuse      SURGICAL HISTORY   Past Surgical History:  Procedure Laterality Date   ABDOMINAL SURGERY     ADENOIDECTOMY     CARPAL TUNNEL RELEASE Right    corpal tunnel Right    ELBOW SURGERY Right    repaired tendon   hemmorhoid N/A    HEMORRHOID SURGERY     NASAL SINUS SURGERY     NASAL SINUS SURGERY     1970s   NOSE SURGERY     reflux surgery     1994   ROTATOR CUFF REPAIR Right    SHOULDER SURGERY     TENNIS ELBOW RELEASE/NIRSCHEL PROCEDURE Right    TONSILLECTOMY     URETHRA SURGERY     surgery 6 times from age [age] yrs old   [place] HISTORY   Family History  Problem Relation Age of Onset   CAD Father    Hyperlipidemia Father    Stroke Father    Heart disease Father    Arthritis Father    Hearing loss Father    Hypertension Father    Hypertension Mother    Peripheral  Artery Disease Mother    Rheum arthritis Mother    Asthma Mother    Bipolar disorder Mother    Depression Mother    Malignant hypertension Mother    Arthritis Mother    Hearing loss Mother    Heart disease Mother    Hyperlipidemia Mother    Kidney disease Mother    Birth defects Brother    Heart disease Brother    Alcohol abuse Daughter    Arthritis Daughter    Asthma Daughter    Depression Daughter    Drug abuse Daughter    Miscarriages / [place] Daughter    Intellectual disability Daughter    Kidney disease Son    Birth defects Paternal Grandmother    Depression Paternal Grandmother    Heart disease Paternal Grandmother    Birth defects Sister    Diabetes Neg Hx      SOCIAL HISTORY   Social History   Tobacco Use   Smoking status: Current Every Day Smoker    Packs/day: 0.10    Years: 40.00    Pack years: 4.00    Types: Cigarettes    Last attempt to quit: 01/14/2018    Years since quitting: 1.5   Smokeless tobacco: Never Used  Substance Use Topics   Alcohol use: No   Drug use: No     MEDICATIONS   Current Medication:  Current Facility-Administered Medications:    morphine 4 MG/ML injection, , , ,    0.9 %  sodium chloride infusion, 250 mL, Intravenous, Continuous, Kasa, Kurian, MD, Last Rate: 10 mL/hr at 08/18/19 1345, 250 mL at 08/18/19 1345   acetaminophen (TYLENOL) tablet 650 mg, 650 mg, Oral, Q6H PRN, 650 mg at 08/18/19 1349 **OR** acetaminophen (TYLENOL) suppository 650 mg, 650 mg, Rectal, Q6H PRN, Tukov-Yual, Magdalene S, NP   amiodarone (NEXTERONE PREMIX) 360-4.14 MG/200ML-% (1.8 mg/mL) IV infusion, 60 mg/hr, Intravenous, Continuous, Blakeney, Dreama Saa, NP, Last Rate: 33.3 mL/hr at 09-14-19 0134, 60 mg/hr at 09-14-19 0134   budesonide (PULMICORT) nebulizer solution 0.5 mg, 0.5 mg, Nebulization, BID, Mortimer Fries, Kurian, MD, 0.5 mg at 09/14/19 0755   chlorhexidine gluconate (MEDLINE KIT) (PERIDEX) 0.12 % solution 15 mL, 15  mL, Mouth Rinse, BID, Mortimer Fries, Kurian, MD, 15 mL at 2019-09-14 0737   Chlorhexidine Gluconate Cloth 2 % PADS 6 each, 6 each, Topical, Daily, Flora Lipps, MD, 6 each at Sep 14, 2019 0856   cholecalciferol (VITAMIN D3) tablet 2,000 Units, 2,000 Units, Per Tube, Daily, Flora Lipps, MD, 2,000 Units at September 14, 2019 0855   dexmedetomidine (PRECEDEX) 400 MCG/100ML (4 mcg/mL) infusion, 0.4-1.2 mcg/kg/hr, Intravenous, Titrated, Awilda Bill, NP, Last Rate: 21.5 mL/hr at 09-14-2019 0843, 0.8 mcg/kg/hr at September 14, 2019 0843   escitalopram (LEXAPRO) tablet 20 mg, 20 mg, Per Tube, QHS, Flora Lipps, MD, 20 mg at 08/19/19 2127   famotidine (PEPCID) tablet 20 mg, 20 mg, Per Tube, BID, Flora Lipps, MD, 20 mg at 09-14-19 0854   feeding supplement (PRO-STAT SUGAR FREE 64) liquid 60 mL, 60 mL, Per Tube, QID, Flora Lipps, MD, 60 mL at 09-14-2019 0856   feeding supplement (VITAL HIGH PROTEIN) liquid 1,000 mL, 1,000 mL, Per Tube, Q24H, Flora Lipps, MD, 1,000 mL at 08/19/19 1439   furosemide (LASIX) injection 40 mg, 40 mg, Intravenous, Once, Kinney Sackmann, MD   HYDROmorphone (DILAUDID) injection 0.5-1 mg, 0.5-1 mg, Intravenous, Q4H PRN, Awilda Bill, NP, 1 mg at Sep 14, 2019 1141   insulin aspart (novoLOG) injection 0-9 Units, 0-9 Units, Subcutaneous, Q4H, Flora Lipps, MD, 2 Units at 2019-09-14 0850   ipratropium-albuterol (DUONEB) 0.5-2.5 (3) MG/3ML nebulizer solution 3 mL, 3 mL, Nebulization, Q4H, Kasa, Kurian, MD, 3 mL at Sep 14, 2019 1121   ketamine (KETALAR) 500 mg in sodium chloride 0.9 % 100 mL (5 mg/mL) infusion, 0.5 mg/kg/hr, Intravenous, Continuous, Lanney Gins, Kendarrius Tanzi, MD, Last Rate: 10.76 mL/hr at 09/14/2019 1047, 0.5 mg/kg/hr at Sep 14, 2019 1047   MEDLINE mouth rinse, 15 mL, Mouth Rinse, 10 times per day, Flora Lipps, MD, 15 mL at 14-Sep-2019 1000   methylPREDNISolone sodium succinate (SOLU-MEDROL) 40 mg/mL injection 40 mg, 40 mg, Intravenous, BID, Mortimer Fries, Maretta Bees, MD, 40 mg at 09/14/2019 0855   metoprolol tartrate (LOPRESSOR)  injection 2.5 mg, 2.5 mg, Intravenous, Q6H PRN, Awilda Bill, NP, 2.5 mg at September 14, 2019 1126   midazolam (VERSED) 2 MG/2ML injection, , , ,    midodrine (PROAMATINE) tablet 5 mg, 5 mg, Per Tube, TID WC, Terique Kawabata, MD, 5 mg at 2019/09/14 6237   multivitamin liquid 15 mL, 15 mL, Per Tube, Daily, Kasa, Kurian, MD, 15 mL at 2019-09-14 0855   mupirocin ointment (BACTROBAN) 2 %, , Nasal, BID, Kasa, Kurian, MD   norepinephrine (LEVOPHED) 4 mg in sodium chloride 0.9 % 250 mL (0.016 mg/mL) infusion, 0-40 mcg/min, Intravenous, Titrated, Ottie Glazier, MD, Last  Rate: 7.5 mL/hr at August 25, 2019 1237, 2 mcg/min at 08-25-2019 1237   polyethylene glycol (MIRALAX / GLYCOLAX) packet 17 g, 17 g, Per Tube, Daily, Charlett Nose, RPH, 17 g at 08/18/19 1051   polyvinyl alcohol (LIQUIFILM TEARS) 1.4 % ophthalmic solution 1 drop, 1 drop, Both Eyes, PRN, Tukov-Yual, Magdalene S, NP, 1 drop at 08/19/19 1400   senna-docusate (Senokot-S) tablet 1 tablet, 1 tablet, Per Tube, BID, Charlett Nose, RPH, 1 tablet at 08/18/19 2213   sodium chloride flush (NS) 0.9 % injection 10-40 mL, 10-40 mL, Intracatheter, Q12H, Kasa, Maretta Bees, MD, 10 mL at 25-Aug-2019 0857   sodium chloride flush (NS) 0.9 % injection 10-40 mL, 10-40 mL, Intracatheter, PRN, Flora Lipps, MD, 10 mL at 08/06/19 2112   vancomycin (VANCOCIN) IVPB 1000 mg/200 mL premix, 1,000 mg, Intravenous, Q12H, Charlett Nose, RPH, Last Rate: 200 mL/hr at 2019-08-25 0510, 1,000 mg at 25-Aug-2019 0510    ALLERGIES   Asa [aspirin], Bee venom, Codeine, Ibuprofen, and Iodinated diagnostic agents    REVIEW OF SYSTEMS     Unable to obtain due to critically ill status on mechanical ventilation  PHYSICAL EXAMINATION   Vital Signs: Temp:  [98.3 F (36.8 C)-99.5 F (37.5 C)] 99.5 F (37.5 C) (12/02 1130) Pulse Rate:  [77-90] 86 (12/02 0700) Resp:  [19-51] 22 (12/02 0700) BP: (91-132)/(60-78) 98/72 (12/02 0700) SpO2:  [91 %-96 %] 93 % (12/02 0757) FiO2 (%):   [70 %-90 %] 70 % (12/02 0757) Weight:  [156 kg] 108 kg (12/02 0500)  GENERAL: Sedated on mechanical ventilation HEAD: Normocephalic, atraumatic.  EYES: Pupils equal, round, reactive to light.  No scleral icterus.  MOUTH: Moist mucosal membrane. NECK: Supple. No thyromegaly. No nodules. No JVD.  PULMONARY: Rhonchorous breath sounds bilaterally with mechanical ventilation in the background CARDIOVASCULAR: S1 and S2. Regular rate and rhythm. No murmurs, rubs, or gallops.  GASTROINTESTINAL: Soft, nontender, non-distended. No masses. Positive bowel sounds. No hepatosplenomegaly.  MUSCULOSKELETAL: No swelling, clubbing, or edema.  NEUROLOGIC: GCS 4 T SKIN:intact,warm,dry   PERTINENT DATA     Infusions:  sodium chloride 250 mL (08/18/19 1345)   amiodarone 60 mg/hr (2019-08-25 0134)   dexmedetomidine (PRECEDEX) IV infusion 0.8 mcg/kg/hr (08/25/19 0843)   ketamine (KETALAR) Adult IV Infusion 0.5 mg/kg/hr (08-25-2019 1047)   norepinephrine (LEVOPHED) 82m / 2552minfusion 2 mcg/min (1212/07/20237)   vancomycin 1,000 mg (1212/07/2020510)   Scheduled Medications:  morphine       budesonide (PULMICORT) nebulizer solution  0.5 mg Nebulization BID   chlorhexidine gluconate (MEDLINE KIT)  15 mL Mouth Rinse BID   Chlorhexidine Gluconate Cloth  6 each Topical Daily   cholecalciferol  2,000 Units Per Tube Daily   escitalopram  20 mg Per Tube QHS   famotidine  20 mg Per Tube BID   feeding supplement (PRO-STAT SUGAR FREE 64)  60 mL Per Tube QID   feeding supplement (VITAL HIGH PROTEIN)  1,000 mL Per Tube Q24H   furosemide  40 mg Intravenous Once   insulin aspart  0-9 Units Subcutaneous Q4H   ipratropium-albuterol  3 mL Nebulization Q4H   mouth rinse  15 mL Mouth Rinse 10 times per day   methylPREDNISolone (SOLU-MEDROL) injection  40 mg Intravenous BID   midazolam       midodrine  5 mg Per Tube TID WC   multivitamin  15 mL Per Tube Daily   mupirocin ointment   Nasal BID    polyethylene glycol  17 g Per Tube Daily  senna-docusate  1 tablet Per Tube BID   sodium chloride flush  10-40 mL Intracatheter Q12H   PRN Medications: acetaminophen **OR** acetaminophen, HYDROmorphone (DILAUDID) injection, metoprolol tartrate, polyvinyl alcohol, sodium chloride flush Hemodynamic parameters:   Intake/Output: 12/01 0701 - 12/02 0700 In: 1757.2 [I.V.:1029.2; NG/GT:233; IV Piggyback:450] Out: 1941 [Urine:2350; Stool:125]  Ventilator  Settings: Vent Mode: PCV FiO2 (%):  [70 %-90 %] 70 % Set Rate:  [18 bmp] 18 bmp Vt Set:  [500 mL] 500 mL PEEP:  [10 cmH20] 10 cmH20 Plateau Pressure:  [22 DEY81-44 cmH20] 22 cmH20   LAB RESULTS:  Basic Metabolic Panel: Recent Labs  Lab 08/14/19 0537  08/15/19 0515 08/16/19 0456 08/17/19 0500 08/17/19 2103 08/18/19 0544 08/19/19 0545 09-09-2019 0328  NA  --    < > 139 139 139 140 141 139 140  K  --    < > 4.3 4.5 3.7 4.0 4.3 4.3 4.3  CL  --    < > 95* 95* 95* 93* 96* 94* 94*  CO2  --    < > 35* 36* 36* 35* 36* 37* 39*  GLUCOSE  --    < > 142* 133* 148* 123* 149* 162* 160*  BUN  --    < > 42* 42* 37* 46* 46* 48* 42*  CREATININE  --    < > 0.53* 0.71 0.55* 0.70 0.65 0.58* 0.59*  CALCIUM  --    < > 8.7* 8.8* 8.2* 8.7* 8.5* 8.6* 8.5*  MG 1.8  --  2.4  --  1.8 2.3 2.2  --  2.3  PHOS 2.9  --   --  3.6  --  4.4  --   --  3.4   < > = values in this interval not displayed.   Liver Function Tests: Recent Labs  Lab 08/15/19 0504 08/16/19 0456  ALBUMIN 2.7* 2.9*   No results for input(s): LIPASE, AMYLASE in the last 168 hours. No results for input(s): AMMONIA in the last 168 hours. CBC: Recent Labs  Lab 08/14/19 0537  08/16/19 0500 08/17/19 0500 08/18/19 1730 08/19/19 0545 09-Sep-2019 0328 09/09/19 1220  WBC 14.2*   < > 19.0* 14.2* 16.9* 12.4* 10.6*  --   NEUTROABS 11.9*  --  17.3*  --   --   --  9.9*  --   HGB 11.0*   < > 11.4* 10.2* 10.8* 9.8* 9.9* 9.3*  HCT 35.4*   < > 36.6* 31.3* 35.2* 32.2* 31.9* 29.4*  MCV 102.9*    < > 102.8* 98.4 104.8* 104.5* 103.2*  --   PLT 175   < > 159 139* 170 138* 132*  --    < > = values in this interval not displayed.   Cardiac Enzymes: No results for input(s): CKTOTAL, CKMB, CKMBINDEX, TROPONINI in the last 168 hours. BNP: Invalid input(s): POCBNP CBG: Recent Labs  Lab 08/19/19 1657 08/19/19 2106 09-09-19 0023 2019/09/09 0327 09/09/2019 0733  GLUCAP 122* 133* 130* 161* 154*     IMAGING RESULTS:  Imaging: Dg Chest Port 1 View  Result Date: 09-09-2019 CLINICAL DATA:  Shortness of breath. Intubated patient. Follow-up exam. EXAM: PORTABLE CHEST 1 VIEW COMPARISON:  September 09, 2019 at 5:50 a.m. FINDINGS: Patchy airspace and interstitial lung opacities, predominantly in the lower lungs, are without significant change from the earlier exam. Lungs are hyperexpanded with relative lucency in the upper lobes consistent with emphysema. Possible small effusions. No evidence of a pneumothorax. Endotracheal tube, nasal/orogastric tube and right PICC are unchanged. IMPRESSION: 1. No  change from the earlier exam. 2. Airspace and interstitial lung opacities, which may reflect multifocal pneumonia or asymmetric edema. This is superimposed on COPD/emphysema. 3. Stable support apparatus. Electronically Signed   By: Lajean Manes M.D.   On: 08/27/2019 12:17   Dg Chest Port 1 View  Result Date: August 27, 2019 CLINICAL DATA:  Hypoxia EXAM: PORTABLE CHEST 1 VIEW COMPARISON:  August 18, 2019 FINDINGS: Endotracheal tube tip is 8.2 cm above the carina. Nasogastric tube tip and side port are in the stomach. Central catheter tip is in the superior vena cava. No pneumothorax. There remains interstitial and alveolar opacities in the lung bases with small pleural effusions bilaterally. Note that there is bullous disease in the upper lobes. No evident adenopathy. No bone lesions. IMPRESSION: Tube and catheter positions as described without pneumothorax. Upper lobe bullous disease. Interstitial and alveolar opacity,  likely edema, in the mid and lower lung zones with small pleural effusions. Stable cardiomegaly. Appearance most consistent with congestive heart failure superimposed on underlying COPD. A degree of superimposed pneumonia in the bases cannot be excluded. Electronically Signed   By: Lowella Grip III M.D.   On: 08/27/2019 07:29    ASSESSMENT AND PLAN    -Multidisciplinary rounds held today  Acute Hypoxic Respiratory Failure with severe ARDS -MRSA community-acquired pneumonia -Continue vancomycin-appreciate Pharm.D.  -Stop nebulizer therapy due to SVT -Currently with severe ARDS PaO2/FiO2 ratio <100 -Ventilator management-optimized lung physiology currently on pressure control mode with 10 of PEEP to minimize driving pressure and allow for acceptable oxygenation -Considering paralysis via vecuronium if necessary -Borderline hypotensive -Stopping fentanyl due to respiratory muscle stiffness hindering lung mechanics, will initiate ketamine to potentiate bronchodilation and adjuvant effect to increase BP -Background of advanced bullous emphysema  Massive hemoptysis  - possibly related to severe bullous emphysema with MRSA pneumonia  - s/p emergency bronchoscopy with evacuation of debris and ice cold lavage , with resolution of bleeding post procedure.   - discussed hemoptysis with POA Caroline   Chronic heart failure with preserved EF - diresed with lasix today post improved hemodynamics  -ICU monitoring     Sepsis -Due to MRSA pneumonia -Continue vancomycin -Consider vasopressors if MAP less than 60 -Starting Solu-Medrol 40 twice daily   ID -continue IV abx as prescibed -follow up cultures  GI/Nutrition GI PROPHYLAXIS as indicated DIET-->TF's as tolerated Constipation protocol as indicated  ENDO - ICU hypoglycemic\Hyperglycemia protocol -check FSBS per protocol   ELECTROLYTES -follow labs as needed -replace as needed -pharmacy consultation   DVT/GI PRX  ordered -SCDs  TRANSFUSIONS AS NEEDED MONITOR FSBS ASSESS the need for LABS as needed   Critical care provider statement:    Critical care time (minutes):  108   Critical care time was exclusive of:  Separately billable procedures and treating other patients   Critical care was necessary to treat or prevent imminent or life-threatening deterioration of the following conditions:   Sepsis, MRSA pneumonia, acute hypoxemic respiratory failure, severe ARDS, multiple comorbid conditions   Critical care was time spent personally by me on the following activities:  Development of treatment plan with patient or surrogate, discussions with consultants, evaluation of patient's response to treatment, examination of patient, obtaining history from patient or surrogate, ordering and performing treatments and interventions, ordering and review of laboratory studies and re-evaluation of patient's condition.  I assumed direction of critical care for this patient from another provider in my specialty: no    This document was prepared using Dragon voice recognition software and may include  unintentional dictation errors.    Ottie Glazier, M.D.  Division of [place]

## 2019-09-19 NOTE — Progress Notes (Signed)
RT at bedside with RN to assess patient status. Patients WOB elevated on ventilator and patient tidal volumes had dropped from the mid 500's to 600 on PCV of 16 and 10 PEEP to the low to mid 100's. Patient switched over to [facility] of 550 x 18 70% and +10 Peep. Patients WOB remained elevated with peak pressures in the upper 60's and plateau pressures in the mid to upper 30's. Patient then coughed up a moderate amount of blood into the ET tube which was suctioned out. Dr. Lanney Gins called to bedside to assess patient.

## 2019-09-19 NOTE — Procedures (Signed)
FLEXIBLE BRONCHOSCOPY PROCEDURE NOTE    Flexible bronchoscopy was performed on 2019/09/14 by : Lanney Gins MD  assistance by : 1)Shannon RT and 2)Tiffany RN   Indication for the procedure was :Massive hemoptysis  Pre-procedural H&P. The following assessment was performed on the day of the procedure prior to initiating sedation History:  Chest pain n Dyspnea y Hemoptysis y Cough y Fever n Other pertinent items y  Examination Vital signs -reviewed as per nursing documentation today Cardiac    Murmurs: n  Rubs : n  Gallop: n Lungs Wheezing: y Rales : y Rhonchi :y  Other pertinent findings: y   Pre-procedural assessment for Procedural Sedation included: Depth of sedation: RASS-1 on MV ASA Classification:  3   Medication list reviewed: y  The patient's interval history was taken and revealed: no new complaints The pre- procedure physical examination revealed: No new findings Refer to prior clinic note for details.  Informed Consent: Informed consent was obtained from:  patient after explanation of procedure and risks, benefits, as well as alternative procedures available.  Explanation of level of sedation and possible transfusion was also provided.    Procedural Preparation: Time out was performed and patient was identified by name and birthdate and procedure to be performed and side for sampling, if any, was specified. Pt was intubated by anesthesia.  The patient was appropriately draped.  Procedure Findings: Patient was noted to have increased O2 requirement on mechanical ventilation with increased peak and plateau pressures and grossly visible bright red blood pouring from endotracheal tube.  Emergency bronchoscopy was performed due to massive hemoptysis.  Bronchoscope was inserted via ETT  without difficulty.  Posterior oropharynx, epiglottis, arytenoids, false cords and vocal cords were not visualized as these were bypassed by endotracheal tube. The distal trachea was  found covered in bright red blood but normal in circumference and appearance without mucosal, cartilaginous or branching abnormalities.  The main carina was mildly splayed .  All right and left lobar airways were not visualized immediately due to bilateral bright red blood.  Ice cold lavage of the right mainstem bronchus was initially performed with aspiration of clot and debris as well as bright red blood.  Visualization of the bronchus intermediate and right middle lobe segments was possible after this an additional 2 bronchoalveolar lavages were performed at the right middle lobe with aspiration of bright red blood and clotted debris.  Next right lower lobe including superior segments were lavaged with ice cold saline and washed out with aspiration of similar bright red blood.  Next to the left side of the lung was inspected and was also found to have bright red blood throughout with less in volume.  Starting with the left mainstem bronchus ice cold lavage was performed this was repeated throughout the upper and lower lobes to subsegmental level for total of 4 more ice cold lavages.  After this airway inspection was performed and only scant bloody debris was visualized with significant improvement overall.  No active bleeding was visualized upon airway evaluation post serial lavages.  Broncho-alveolar lavage site:bilateral                            Fluoroscopy Used: 0;        Pictorial documentation attached: 0                 Transbronchial WANG needle aspiration site: none sent for: none  Endoscopic Linear Ultrasound Used: none  Pictorial documentation attached: none    Immediate sampling complications included:none Epinephrine 70m was used topically  The bronchoscopy was terminated due to completion of the planned procedure and the bronchoscope was removed.   Total dosage of Lidocaine was 026mTotal fluoroscopy time was 0 minutes  Supplemental oxygen was  provided at 100FIO2 on MV lpm by nasal canula post operatively     Disposition: Patient is critically ill in MICU   FrClaudette StaplerD  [facility]

## 2019-09-19 NOTE — Progress Notes (Signed)
RT assisted MD with bedside bronchoscopy due to copious amounts of blood being suctioned via in-line suction catheter and elevated peak and plateau pressures on the ventilator.  Massive amount of blood suctioned from patients airway during procedure. Patient tolerated well with saturations in the low to mid 90's during procedure.

## 2019-09-19 NOTE — Progress Notes (Signed)
Patient extubated to comfort care per MD/family request.

## 2019-09-19 DEATH — deceased

## 2019-09-25 ENCOUNTER — Ambulatory Visit: Payer: Medicare Other | Admitting: Family

## 2020-10-16 IMAGING — DX PORTABLE CHEST - 1 VIEW
1 series · 1 of 1 positions shown · non-contrast
Comparison: Radiographs 04/01/2019 and 10/16/2018.  CT 05/22/2018.

CLINICAL DATA: Pedal edema with lower extremity skin discoloration.
Cough and congestion.

EXAM:
PORTABLE CHEST 1 VIEW

[chest ap]
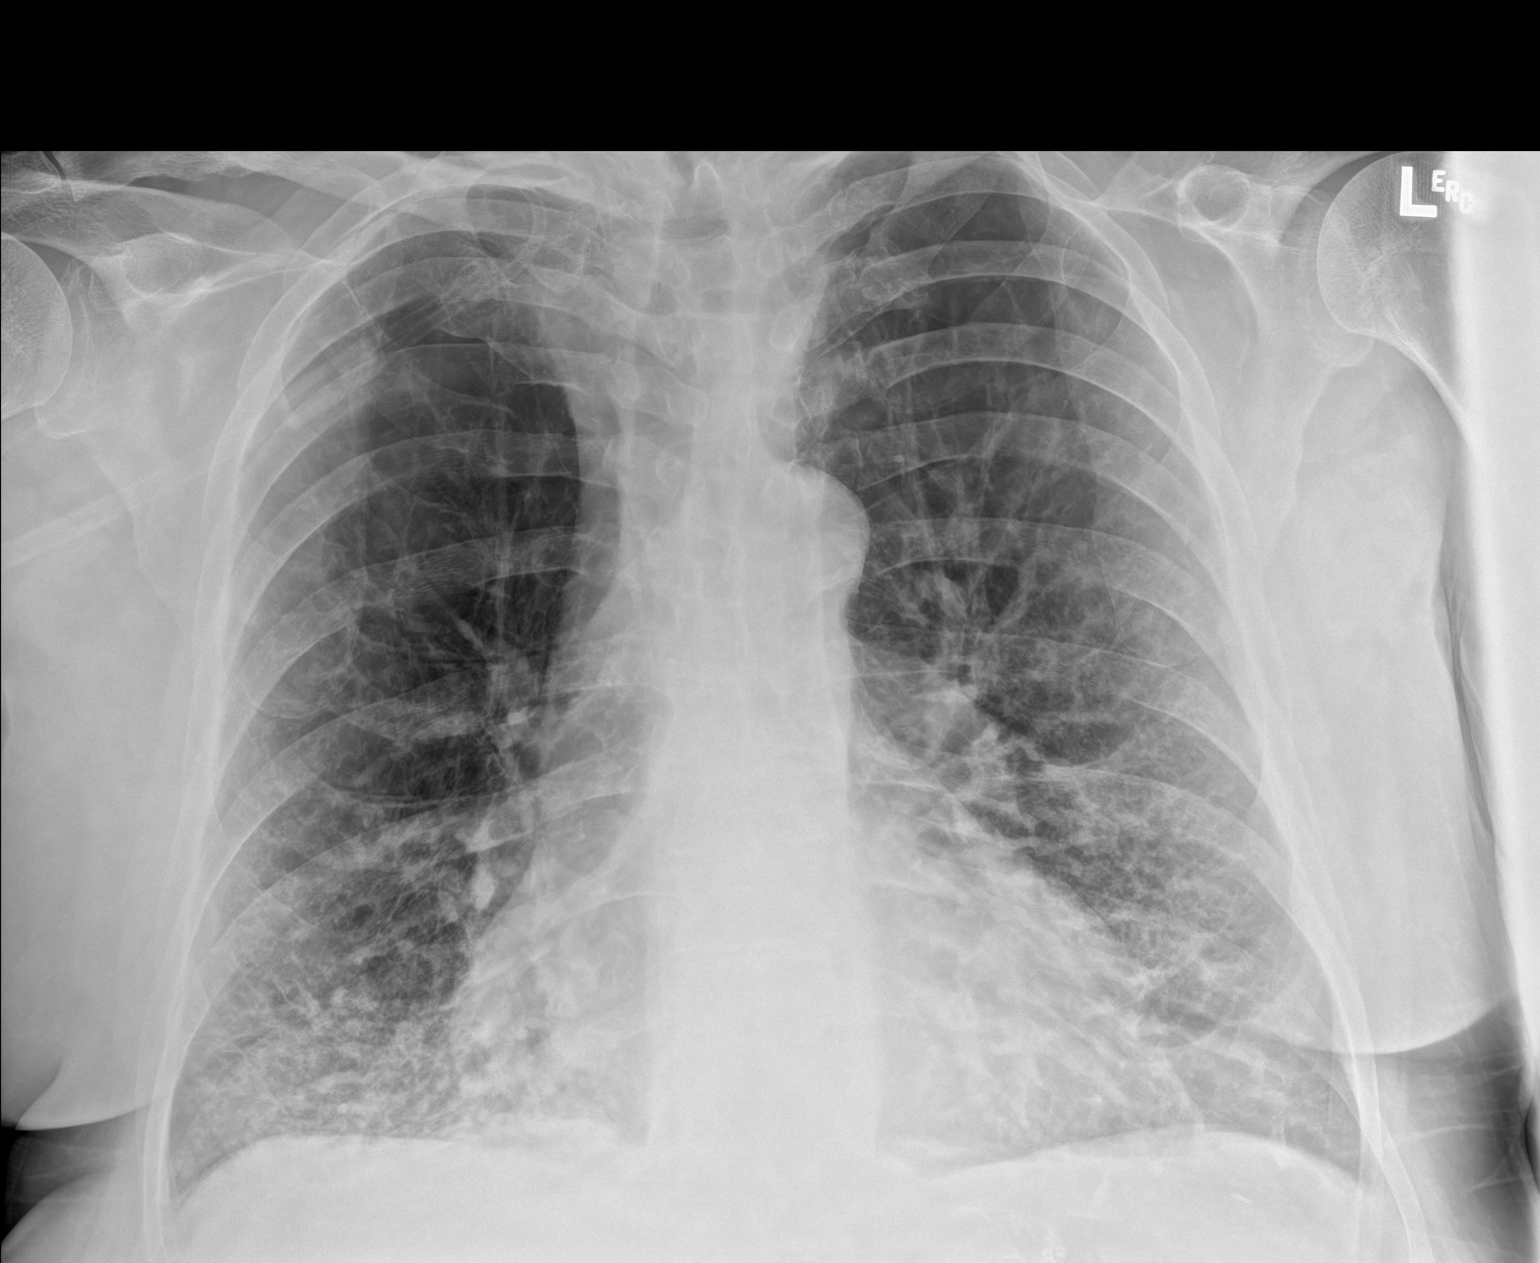

[1 of 1 positions shown; findings below may reference images not displayed]

FINDINGS: 9722 hours. The heart size and mediastinal contours are stable.
There is aortic atherosclerosis. There is underlying moderate to
severe centrilobular emphysema with increased interstitial
thickening at both lung bases. There is no confluent airspace
opacity, significant pleural effusion or pneumothorax. The bones
appear unchanged.
IMPRESSION: Progressive interstitial thickening at both lung bases suspicious
for mild pulmonary edema or inflammation superimposed on emphysema.
No focal airspace disease.
